# Patient Record
Sex: Male | Born: 1945 | Race: White | Hispanic: No | Marital: Married | Smoking: Former smoker
Health system: Southern US, Community
[De-identification: ages and names within clinical notes are randomized; demographics above are authoritative.]

## PROBLEM LIST (undated history)

## (undated) DIAGNOSIS — M545 Low back pain, unspecified: Secondary | ICD-10-CM

## (undated) DIAGNOSIS — M79606 Pain in leg, unspecified: Secondary | ICD-10-CM

## (undated) DIAGNOSIS — J9811 Atelectasis: Secondary | ICD-10-CM

## (undated) DIAGNOSIS — M5416 Radiculopathy, lumbar region: Secondary | ICD-10-CM

## (undated) DIAGNOSIS — M51369 Other intervertebral disc degeneration, lumbar region without mention of lumbar back pain or lower extremity pain: Secondary | ICD-10-CM

## (undated) DIAGNOSIS — I2510 Atherosclerotic heart disease of native coronary artery without angina pectoris: Secondary | ICD-10-CM

## (undated) DIAGNOSIS — B029 Zoster without complications: Secondary | ICD-10-CM

## (undated) DIAGNOSIS — G4730 Sleep apnea, unspecified: Secondary | ICD-10-CM

## (undated) DIAGNOSIS — R609 Edema, unspecified: Secondary | ICD-10-CM

## (undated) DIAGNOSIS — I5189 Other ill-defined heart diseases: Secondary | ICD-10-CM

## (undated) DIAGNOSIS — Z7901 Long term (current) use of anticoagulants: Secondary | ICD-10-CM

## (undated) DIAGNOSIS — H9190 Unspecified hearing loss, unspecified ear: Secondary | ICD-10-CM

## (undated) DIAGNOSIS — Z8619 Personal history of other infectious and parasitic diseases: Secondary | ICD-10-CM

## (undated) DIAGNOSIS — M48062 Spinal stenosis, lumbar region with neurogenic claudication: Secondary | ICD-10-CM

## (undated) DIAGNOSIS — G629 Polyneuropathy, unspecified: Secondary | ICD-10-CM

## (undated) DIAGNOSIS — M1990 Unspecified osteoarthritis, unspecified site: Secondary | ICD-10-CM

## (undated) DIAGNOSIS — I499 Cardiac arrhythmia, unspecified: Secondary | ICD-10-CM

## (undated) DIAGNOSIS — I498 Other specified cardiac arrhythmias: Secondary | ICD-10-CM

## (undated) DIAGNOSIS — I4719 Other supraventricular tachycardia: Secondary | ICD-10-CM

## (undated) DIAGNOSIS — M5136 Other intervertebral disc degeneration, lumbar region: Secondary | ICD-10-CM

## (undated) DIAGNOSIS — M5126 Other intervertebral disc displacement, lumbar region: Secondary | ICD-10-CM

## (undated) DIAGNOSIS — E119 Type 2 diabetes mellitus without complications: Secondary | ICD-10-CM

## (undated) DIAGNOSIS — E7800 Pure hypercholesterolemia, unspecified: Secondary | ICD-10-CM

## (undated) DIAGNOSIS — I5030 Unspecified diastolic (congestive) heart failure: Secondary | ICD-10-CM

## (undated) DIAGNOSIS — Z8709 Personal history of other diseases of the respiratory system: Secondary | ICD-10-CM

## (undated) DIAGNOSIS — I480 Paroxysmal atrial fibrillation: Secondary | ICD-10-CM

## (undated) DIAGNOSIS — J449 Chronic obstructive pulmonary disease, unspecified: Secondary | ICD-10-CM

## (undated) DIAGNOSIS — R0600 Dyspnea, unspecified: Secondary | ICD-10-CM

## (undated) DIAGNOSIS — I10 Essential (primary) hypertension: Secondary | ICD-10-CM

## (undated) DIAGNOSIS — J45909 Unspecified asthma, uncomplicated: Secondary | ICD-10-CM

## (undated) DIAGNOSIS — E039 Hypothyroidism, unspecified: Secondary | ICD-10-CM

## (undated) DIAGNOSIS — I471 Supraventricular tachycardia: Secondary | ICD-10-CM

## (undated) HISTORY — DX: Essential (primary) hypertension: I10

## (undated) HISTORY — PX: KNEE ARTHROSCOPY: SHX127

## (undated) HISTORY — PX: CORONARY ANGIOPLASTY: SHX604

## (undated) HISTORY — DX: Paroxysmal atrial fibrillation: I48.0

## (undated) HISTORY — DX: Zoster without complications: B02.9

## (undated) HISTORY — DX: Other specified cardiac arrhythmias: I49.8

## (undated) HISTORY — PX: ANTERIOR CERVICAL DECOMP/DISCECTOMY FUSION: SHX1161

## (undated) HISTORY — DX: Other supraventricular tachycardia: I47.19

## (undated) HISTORY — DX: Unspecified asthma, uncomplicated: J45.909

## (undated) HISTORY — PX: BACK SURGERY: SHX140

## (undated) HISTORY — DX: Other ill-defined heart diseases: I51.89

## (undated) HISTORY — DX: Supraventricular tachycardia: I47.1

---

## 2001-05-01 HISTORY — PX: KNEE SURGERY: SHX244

## 2003-12-21 DIAGNOSIS — K635 Polyp of colon: Secondary | ICD-10-CM | POA: Insufficient documentation

## 2006-05-01 HISTORY — PX: NECK SURGERY: SHX720

## 2007-09-03 ENCOUNTER — Ambulatory Visit: Payer: Self-pay | Admitting: Internal Medicine

## 2007-12-02 ENCOUNTER — Ambulatory Visit: Payer: Self-pay | Admitting: Family

## 2008-01-21 ENCOUNTER — Other Ambulatory Visit: Payer: Self-pay

## 2008-01-22 ENCOUNTER — Observation Stay: Payer: Self-pay | Admitting: Internal Medicine

## 2008-01-22 ENCOUNTER — Ambulatory Visit: Payer: Self-pay | Admitting: Cardiology

## 2009-02-01 ENCOUNTER — Ambulatory Visit (HOSPITAL_COMMUNITY): Admission: RE | Admit: 2009-02-01 | Discharge: 2009-02-02 | Payer: Self-pay | Admitting: Neurosurgery

## 2010-08-05 LAB — CBC
MCHC: 34.4 g/dL (ref 30.0–36.0)
MCV: 91.2 fL (ref 78.0–100.0)
RBC: 5.11 MIL/uL (ref 4.22–5.81)
RDW: 12.4 % (ref 11.5–15.5)

## 2010-08-05 LAB — BASIC METABOLIC PANEL
BUN: 13 mg/dL (ref 6–23)
Calcium: 9.1 mg/dL (ref 8.4–10.5)
GFR calc Af Amer: >60 mL/min (ref >60)
GFR calc non Af Amer: >60 mL/min (ref >60)
Sodium: 139 mEq/L (ref 135–145)

## 2012-03-31 DIAGNOSIS — E1169 Type 2 diabetes mellitus with other specified complication: Secondary | ICD-10-CM | POA: Insufficient documentation

## 2012-04-19 ENCOUNTER — Encounter: Payer: Self-pay | Admitting: *Deleted

## 2012-05-02 ENCOUNTER — Ambulatory Visit (INDEPENDENT_AMBULATORY_CARE_PROVIDER_SITE_OTHER): Payer: Medicare Other | Admitting: Cardiovascular Disease

## 2012-05-02 ENCOUNTER — Encounter: Payer: Self-pay | Admitting: Cardiovascular Disease

## 2012-05-02 VITALS — BP 134/80 | HR 53 | Ht 71.0 in | Wt 219.8 lb

## 2012-05-02 DIAGNOSIS — M542 Cervicalgia: Secondary | ICD-10-CM

## 2012-05-02 DIAGNOSIS — I208 Other forms of angina pectoris: Secondary | ICD-10-CM

## 2012-05-02 DIAGNOSIS — I209 Angina pectoris, unspecified: Secondary | ICD-10-CM

## 2012-05-02 DIAGNOSIS — R0602 Shortness of breath: Secondary | ICD-10-CM

## 2012-05-02 DIAGNOSIS — I2089 Other forms of angina pectoris: Secondary | ICD-10-CM | POA: Insufficient documentation

## 2012-05-02 NOTE — Patient Instructions (Addendum)

## 2012-05-02 NOTE — Patient Instructions (Addendum)
Your physician has requested that you have an exercise tolerance test. For further information please visit [email or web address]. Please also follow instruction sheet, as given.  Your physician has requested that you have an echocardiogram. Echocardiography is a painless test that uses sound waves to create images of your heart. It provides your doctor with information about the size and shape of your heart and how well your heart's chambers and valves are working. This procedure takes approximately one hour. There are no restrictions for this procedure.   

## 2012-05-02 NOTE — Assessment & Plan Note (Addendum)
The patient's symptoms of exertional neck and throat discomfort associated with significant dyspnea are worrisome for angina at low level of activities. He was recently diagnosed with type 2 diabetes and hyperlipidemia. Thus, I decided to proceed with a treadmill stress test. He was only able to exercise for 3-1/2 minutes with significant tachycardic response with frequent PACs and PVCs. He started having significant neck and throat tightness similar to his describe symptoms with 2 mm ST depression. The stress test was highly abnormal with a Duke treadmill score of -14 which is high risk. Due to that, I recommend proceeding with cardiac catheterization and possible coronary intervention. Risks, benefits and alternatives were discussed with the patient. I asked him to start taking aspirin and Lipitor which was prescribed to him. He reports being allergic to iodine. However, he mentions that it was given to him during a stress test. He had CT scans in the past without reactions. Thus, I don't think he is truly allergic to the IV contrast. I will nonetheless give him IV steroids and Benadryl on-call to the cath lab but will not treat him with prednisone. He reports family history of abdominal aortic aneurysm. I will try to do abdominal aortogram at the same time.

## 2012-05-02 NOTE — Procedures (Signed)
    Treadmill Stress test  Indication: Exertional neck discomfort and dyspnea.  Baseline Data:  Resting EKG shows NSR with rate of 62 bpm, no significant ST changes. Resting blood pressure of 134/80 mm Hg Stand bruce protocal was used.  Exercise Data:  Patient exercised for 3 min 49 sec,  Peak heart rate of 169 bpm.  This was 109 % of the maximum predicted heart rate. The patient had significant throat and neck tightness within 3 minutes of exercise with associated ECG changes. Symptoms resolved after about 5 minutes of rest.  Peak Blood pressure recorded was 168/82 Maximal work level: 4.6 METs.  Heart rate at 3 minutes in recovery was 59 bpm. BP response: Normal HR response: Accelerated.  EKG with Exercise: Sinus tachycardia with significant PACs and PVCs. 2 mm of horizontal and downsloping ST depression in V3 to V6 and 1 mm of horizontal ST depression in the inferior leads.  FINAL IMPRESSION: Abnormal exercise stress test. Significant ST depression with exercise at low level of stress.  Poor exercise tolerance with exercise induced neck and throat discomfort. Duke treadmill score of -14 which is high risk.  Recommendation: Cardiac catheterization.

## 2012-05-02 NOTE — Progress Notes (Signed)
HPI  This is a 67 year old male who was referred by Dr. Juanetta Gosling for evaluation of possible atypical angina due to exertional neck and throat tightness. The patient is not aware of any previous cardiac history. He reports having a stress test done years ago at Dr. Fredna Dow office without reported abnormalities. He was recently diagnosed with type 2 diabetes, hyperlipidemia and hypothyroidism. The patient has been having exertional neck and throat tightness associated with significant dyspnea which started a few months ago and has been getting worse. He is currently happening with minimal activities. He denies any chest discomfort. No orthopnea, PND or lower extremity edema.  Allergies  Allergen Reactions  . Iodine     Rash and itching  . Penicillins     Hives  . Sulfa Antibiotics     Difficulty Breathing     Current Outpatient Prescriptions on File Prior to Visit  Medication Sig Dispense Refill  . atorvastatin (LIPITOR) 10 MG tablet Take 10 mg by mouth daily.      [name] levothyroxine (SYNTHROID, LEVOTHROID) 25 MCG tablet Take 25 mcg by mouth daily.      . metFORMIN (GLUMETZA) 500 MG (MOD) 24 hr tablet Take 500 mg by mouth 2 (two) times daily with a meal.         Past Medical History  Diagnosis Date  . Diabetes mellitus without complication   . Thyroid disease      Past Surgical History  Procedure Date  . Knee surgery 2003    right  . Neck surgery 2008    Plate in neck     Family History  Problem Relation Age of Onset  . Heart disease Mother   . Clotting disorder Mother   . Hypertension Sister      History   Social History  . Marital Status: Married    Spouse Name: N/A    Number of Children: N/A  . Years of Education: N/A   Occupational History  . Not on file.   Social History Main Topics  . Smoking status: Former Smoker -- 8 years    Types: Cigarettes  . Smokeless tobacco: Not on file  . Alcohol Use: No  . Drug Use: No  . Sexually Active:    Other  Topics Concern  . Not on file   Social History Narrative  . No narrative on file     ROS Constitutional: Negative for fever, chills, diaphoresis, activity change, appetite change and fatigue.  HENT: Negative for hearing loss, nosebleeds, congestion, sore throat, facial swelling, drooling, trouble swallowing, neck pain, voice change, sinus pressure and tinnitus.  Eyes: Negative for photophobia, pain, discharge and visual disturbance.  Respiratory: Negative for apnea, cough, chest tightness and wheezing.  Cardiovascular: Negative for chest pain, palpitations and leg swelling.  Gastrointestinal: Negative for nausea, vomiting, abdominal pain, diarrhea, constipation, blood in stool and abdominal distention.  Genitourinary: Negative for dysuria, urgency, frequency, hematuria and decreased urine volume.  Musculoskeletal: Negative for myalgias, back pain, joint swelling, arthralgias and gait problem.  Skin: Negative for color change, pallor, rash and wound.  Neurological: Negative for dizziness, tremors, seizures, syncope, speech difficulty, weakness, light-headedness, numbness and headaches.  Psychiatric/Behavioral: Negative for suicidal ideas, hallucinations, behavioral problems and agitation. The patient is not nervous/anxious.     PHYSICAL EXAM   BP 134/80  Pulse 53  Ht 5\' 11"  (1.803 m)  Wt 219 lb 12 oz (99.678 kg)  BMI 30.65 kg/m2  Constitutional: He is oriented to person, place, and time.  He appears well-developed and well-nourished. No distress.  HENT: No nasal discharge.  Head: Normocephalic and atraumatic.  Eyes: Pupils are equal and round. Right eye exhibits no discharge. Left eye exhibits no discharge.  Neck: Normal range of motion. Neck supple. No JVD present. No thyromegaly present.  Cardiovascular: Normal rate, regular rhythm, normal heart sounds and. Exam reveals no gallop and no friction rub. No murmur heard.  Pulmonary/Chest: Effort normal and breath sounds normal. No  stridor. No respiratory distress. He has no wheezes. He has no rales. He exhibits no tenderness.  Abdominal: Soft. Bowel sounds are normal. He exhibits no distension. There is no tenderness. There is no rebound and no guarding.  Musculoskeletal: Normal range of motion. He exhibits no edema and no tenderness.  Neurological: He is alert and oriented to person, place, and time. Coordination normal.  Skin: Skin is warm and dry. No rash noted. He is not diaphoretic. No erythema. No pallor.  Psychiatric: He has a normal mood and affect. His behavior is normal. Judgment and thought content normal.      EKG: Sinus  Bradycardia  - occasional PAC    # PACs = 1. -Poor R-wave progression -nonspecific -consider old anterior infarct.   BORDERLINE   ASSESSMENT AND PLAN

## 2012-05-06 ENCOUNTER — Ambulatory Visit: Payer: Self-pay | Admitting: Cardiovascular Disease

## 2012-05-06 ENCOUNTER — Other Ambulatory Visit: Payer: Self-pay | Admitting: *Deleted

## 2012-05-06 ENCOUNTER — Other Ambulatory Visit: Payer: Self-pay | Admitting: Cardiovascular Disease

## 2012-05-06 ENCOUNTER — Encounter: Payer: Self-pay | Admitting: Cardiovascular Disease

## 2012-05-06 ENCOUNTER — Inpatient Hospital Stay (HOSPITAL_COMMUNITY): Payer: Medicare Other

## 2012-05-06 ENCOUNTER — Telehealth: Payer: Self-pay

## 2012-05-06 ENCOUNTER — Encounter (HOSPITAL_COMMUNITY): Payer: Self-pay | Admitting: General Practice

## 2012-05-06 ENCOUNTER — Inpatient Hospital Stay (HOSPITAL_COMMUNITY)
Admission: AD | Admit: 2012-05-06 | Discharge: 2012-05-13 | DRG: 236 | Disposition: A | Discharge status: Home / Self Care | Payer: Medicare Other | Source: Other Acute Inpatient Hospital | Attending: Cardiothoracic Surgery | Admitting: Cardiothoracic Surgery

## 2012-05-06 DIAGNOSIS — I2510 Atherosclerotic heart disease of native coronary artery without angina pectoris: Secondary | ICD-10-CM

## 2012-05-06 DIAGNOSIS — Z951 Presence of aortocoronary bypass graft: Secondary | ICD-10-CM

## 2012-05-06 DIAGNOSIS — I200 Unstable angina: Secondary | ICD-10-CM | POA: Diagnosis present

## 2012-05-06 DIAGNOSIS — Z87891 Personal history of nicotine dependence: Secondary | ICD-10-CM

## 2012-05-06 DIAGNOSIS — E039 Hypothyroidism, unspecified: Secondary | ICD-10-CM | POA: Diagnosis present

## 2012-05-06 DIAGNOSIS — E119 Type 2 diabetes mellitus without complications: Secondary | ICD-10-CM

## 2012-05-06 DIAGNOSIS — E785 Hyperlipidemia, unspecified: Secondary | ICD-10-CM | POA: Diagnosis present

## 2012-05-06 DIAGNOSIS — IMO0001 Reserved for inherently not codable concepts without codable children: Secondary | ICD-10-CM | POA: Diagnosis present

## 2012-05-06 DIAGNOSIS — I4891 Unspecified atrial fibrillation: Secondary | ICD-10-CM | POA: Diagnosis present

## 2012-05-06 DIAGNOSIS — Z01810 Encounter for preprocedural cardiovascular examination: Secondary | ICD-10-CM

## 2012-05-06 DIAGNOSIS — I739 Peripheral vascular disease, unspecified: Secondary | ICD-10-CM

## 2012-05-06 DIAGNOSIS — D62 Acute posthemorrhagic anemia: Secondary | ICD-10-CM | POA: Diagnosis not present

## 2012-05-06 HISTORY — DX: Atherosclerotic heart disease of native coronary artery without angina pectoris: I25.10

## 2012-05-06 HISTORY — DX: Personal history of other diseases of the respiratory system: Z87.09

## 2012-05-06 HISTORY — DX: Atelectasis: J98.11

## 2012-05-06 HISTORY — DX: Type 2 diabetes mellitus without complications: E11.9

## 2012-05-06 HISTORY — DX: Hypothyroidism, unspecified: E03.9

## 2012-05-06 HISTORY — PX: CARDIAC CATHETERIZATION: SHX172

## 2012-05-06 HISTORY — DX: Unspecified osteoarthritis, unspecified site: M19.90

## 2012-05-06 HISTORY — DX: Pure hypercholesterolemia, unspecified: E78.00

## 2012-05-06 LAB — TYPE AND SCREEN
ABO/RH(D): A POS
Antibody Screen: NEGATIVE

## 2012-05-06 LAB — URINALYSIS, ROUTINE W REFLEX MICROSCOPIC
Bilirubin Urine: NEGATIVE
Hgb urine dipstick: NEGATIVE
Nitrite: NEGATIVE
Specific Gravity, Urine: 1.015 (ref 1.005–1.030)
pH: 5 (ref 5.0–8.0)

## 2012-05-06 LAB — URINE MICROSCOPIC-ADD ON

## 2012-05-06 LAB — PROTIME-INR
INR: 0.9
Prothrombin Time: 12.3 secs (ref 11.5–14.7)

## 2012-05-06 LAB — CREATININE, SERUM
Creatinine, Ser: 0.81 mg/dL (ref 0.50–1.35)
GFR calc Af Amer: >90 mL/min (ref >90)
GFR calc non Af Amer: >90 mL/min (ref >90)

## 2012-05-06 LAB — CBC
HCT: 44.4 % (ref 39.0–52.0)
Hemoglobin: 15.9 g/dL (ref 13.0–17.0)
MCHC: 35.8 g/dL (ref 30.0–36.0)
RDW: 12.2 % (ref 11.5–15.5)
WBC: 6.4 10*3/uL (ref 4.0–10.5)

## 2012-05-06 LAB — GLUCOSE, CAPILLARY
Glucose-Capillary: 397 mg/dL — ABNORMAL HIGH (ref 70–99)
Glucose-Capillary: 309 mg/dL — ABNORMAL HIGH (ref 70–99)

## 2012-05-06 MED ORDER — INSULIN ASPART 100 UNIT/ML ~~LOC~~ SOLN
15.0000 [IU] | Freq: Once | SUBCUTANEOUS | Status: AC
  Administered 2012-05-06: 15 [IU] via SUBCUTANEOUS

## 2012-05-06 MED ORDER — BISACODYL 5 MG PO TBEC
5.0000 mg | DELAYED_RELEASE_TABLET | Freq: Once | ORAL | Status: AC
  Administered 2012-05-06: 5 mg via ORAL
  Filled 2012-05-06: qty 1

## 2012-05-06 MED ORDER — PHENYLEPHRINE HCL 10 MG/ML IJ SOLN
30.0000 ug/min | INTRAMUSCULAR | Status: DC
  Filled 2012-05-06: qty 2

## 2012-05-06 MED ORDER — ALBUTEROL SULFATE (5 MG/ML) 0.5% IN NEBU
2.5000 mg | INHALATION_SOLUTION | Freq: Once | RESPIRATORY_TRACT | Status: AC
  Administered 2012-05-06: 2.5 mg via RESPIRATORY_TRACT

## 2012-05-06 MED ORDER — MAGNESIUM SULFATE 50 % IJ SOLN
40.0000 meq | INTRAMUSCULAR | Status: DC
  Filled 2012-05-06: qty 10

## 2012-05-06 MED ORDER — ASPIRIN EC 81 MG PO TBEC
81.0000 mg | DELAYED_RELEASE_TABLET | Freq: Every morning | ORAL | Status: DC
  Filled 2012-05-06: qty 1

## 2012-05-06 MED ORDER — TEMAZEPAM 15 MG PO CAPS: 15.0000 mg | ORAL_CAPSULE | Freq: Once | ORAL | Status: AC | PRN

## 2012-05-06 MED ORDER — INSULIN ASPART 100 UNIT/ML ~~LOC~~ SOLN: 0.0000 [IU] | Freq: Every day | SUBCUTANEOUS | Status: DC

## 2012-05-06 MED ORDER — DEXMEDETOMIDINE HCL IN NACL 400 MCG/100ML IV SOLN
0.1000 ug/kg/h | INTRAVENOUS | Status: DC
  Filled 2012-05-06: qty 100

## 2012-05-06 MED ORDER — SODIUM CHLORIDE 0.9 % IJ SOLN: 3.0000 mL | INTRAMUSCULAR | Status: DC | PRN

## 2012-05-06 MED ORDER — ZOLPIDEM TARTRATE 5 MG PO TABS: 5.0000 mg | ORAL_TABLET | Freq: Every evening | ORAL | Status: DC | PRN

## 2012-05-06 MED ORDER — PLASMA-LYTE 148 IV SOLN
INTRAVENOUS | Status: AC
  Administered 2012-05-07: 10:00:00
  Filled 2012-05-06: qty 2.5

## 2012-05-06 MED ORDER — INSULIN ASPART 100 UNIT/ML ~~LOC~~ SOLN
0.0000 [IU] | Freq: Three times a day (TID) | SUBCUTANEOUS | Status: DC
  Administered 2012-05-06: 11 [IU] via SUBCUTANEOUS
  Administered 2012-05-07: 8 [IU] via SUBCUTANEOUS

## 2012-05-06 MED ORDER — ENOXAPARIN SODIUM 40 MG/0.4ML ~~LOC~~ SOLN
40.0000 mg | SUBCUTANEOUS | Status: DC
  Filled 2012-05-06: qty 0.4

## 2012-05-06 MED ORDER — AMINOCAPROIC ACID 250 MG/ML IV SOLN
INTRAVENOUS | Status: DC
  Filled 2012-05-06: qty 40

## 2012-05-06 MED ORDER — LEVOFLOXACIN IN D5W 500 MG/100ML IV SOLN
500.0000 mg | INTRAVENOUS | Status: DC
  Filled 2012-05-06 (×2): qty 100

## 2012-05-06 MED ORDER — ACETAMINOPHEN 325 MG PO TABS: 650.0000 mg | ORAL_TABLET | ORAL | Status: DC | PRN

## 2012-05-06 MED ORDER — LORATADINE 10 MG PO TABS
10.0000 mg | ORAL_TABLET | Freq: Every day | ORAL | Status: DC | PRN
  Filled 2012-05-06: qty 1

## 2012-05-06 MED ORDER — ALPRAZOLAM 0.25 MG PO TABS: 0.2500 mg | ORAL_TABLET | Freq: Two times a day (BID) | ORAL | Status: DC | PRN

## 2012-05-06 MED ORDER — EPINEPHRINE HCL 1 MG/ML IJ SOLN
0.5000 ug/min | INTRAVENOUS | Status: DC
  Filled 2012-05-06: qty 4

## 2012-05-06 MED ORDER — VANCOMYCIN HCL 10 G IV SOLR
1500.0000 mg | INTRAVENOUS | Status: AC
  Administered 2012-05-07: 1500 mg via INTRAVENOUS
  Filled 2012-05-06: qty 1500

## 2012-05-06 MED ORDER — ATORVASTATIN CALCIUM 20 MG PO TABS
20.0000 mg | ORAL_TABLET | Freq: Every evening | ORAL | Status: DC
  Administered 2012-05-06 – 2012-05-12 (×6): 20 mg via ORAL
  Filled 2012-05-06 (×9): qty 1

## 2012-05-06 MED ORDER — SODIUM CHLORIDE 0.9 % IV SOLN
INTRAVENOUS | Status: DC
  Filled 2012-05-06: qty 1

## 2012-05-06 MED ORDER — NITROGLYCERIN IN D5W 200-5 MCG/ML-% IV SOLN
2.0000 ug/min | INTRAVENOUS | Status: DC
  Filled 2012-05-06: qty 250

## 2012-05-06 MED ORDER — METOPROLOL TARTRATE 12.5 MG HALF TABLET
12.5000 mg | ORAL_TABLET | Freq: Once | ORAL | Status: AC
  Administered 2012-05-07: 12.5 mg via ORAL
  Filled 2012-05-06: qty 1

## 2012-05-06 MED ORDER — SODIUM CHLORIDE 0.9 % IJ SOLN
3.0000 mL | Freq: Two times a day (BID) | INTRAMUSCULAR | Status: DC
  Administered 2012-05-06: 3 mL via INTRAVENOUS

## 2012-05-06 MED ORDER — DOPAMINE-DEXTROSE 3.2-5 MG/ML-% IV SOLN
2.0000 ug/kg/min | INTRAVENOUS | Status: DC
  Filled 2012-05-06: qty 250

## 2012-05-06 MED ORDER — NITROGLYCERIN 0.4 MG SL SUBL: 0.4000 mg | SUBLINGUAL_TABLET | SUBLINGUAL | Status: DC | PRN

## 2012-05-06 MED ORDER — POTASSIUM CHLORIDE 2 MEQ/ML IV SOLN
80.0000 meq | INTRAVENOUS | Status: DC
  Filled 2012-05-06: qty 40

## 2012-05-06 MED ORDER — SODIUM CHLORIDE 0.9 % IV SOLN
250.0000 mL | INTRAVENOUS | Status: DC | PRN
  Administered 2012-05-06: 250 mL via INTRAVENOUS

## 2012-05-06 MED ORDER — INSULIN ASPART 100 UNIT/ML ~~LOC~~ SOLN
8.0000 [IU] | Freq: Once | SUBCUTANEOUS | Status: AC
  Administered 2012-05-07: 8 [IU] via SUBCUTANEOUS

## 2012-05-06 MED ORDER — ONDANSETRON HCL 4 MG/2ML IJ SOLN: 4.0000 mg | Freq: Four times a day (QID) | INTRAMUSCULAR | Status: DC | PRN

## 2012-05-06 MED ORDER — HEPARIN (PORCINE) IN NACL 100-0.45 UNIT/ML-% IJ SOLN
1350.0000 [IU]/h | INTRAMUSCULAR | Status: DC
  Administered 2012-05-06: 1350 [IU]/h via INTRAVENOUS
  Filled 2012-05-06 (×2): qty 250

## 2012-05-06 MED ORDER — CHLORHEXIDINE GLUCONATE 4 % EX LIQD
60.0000 mL | Freq: Once | CUTANEOUS | Status: AC
  Administered 2012-05-07: 4 via TOPICAL
  Filled 2012-05-06 (×2): qty 60

## 2012-05-06 MED ORDER — LEVOTHYROXINE SODIUM 25 MCG PO TABS
25.0000 ug | ORAL_TABLET | Freq: Every morning | ORAL | Status: DC
  Administered 2012-05-08 – 2012-05-13 (×6): 25 ug via ORAL
  Filled 2012-05-06 (×7): qty 1

## 2012-05-06 NOTE — Telephone Encounter (Signed)
Dr. Juanetta Gosling' office made aware of transfer to [facility] for CABG

## 2012-05-06 NOTE — Progress Notes (Signed)
Patient's CBG=397.  I notified Theodore Demark, PA.  She stated to give 15 units of novolog X 1.

## 2012-05-06 NOTE — Progress Notes (Addendum)
MRN: [id number] - Has H&P and cath report as scanned document.  Philip [name] is a 67 year old male with no previous history of CAD. He was recently diagnosed with diabetes and hyperlipidemia but has taken meds for less than a week. He was seen by Dr Kirke Corin for chest pain, symptoms concerning for angina and cathed at [facility] today. Details are available under MRN: [id number], he has significant CAD and was transferred to [facility] for TCTS evaluation.   Currently, Philip [name] is without chest pain or SOB. He is having no problems with his cath site. He has not taken his metformin yesterday or today. His blood sugar was > 300 by [facility]. On a recent lipid profile, his total cholesterol was 228 with an LDL of 147. He does not know his A1C.   Have contacted [facility] to obtain a CD of the catheterization. Will order home meds, ADA diet and SSI. No beta blocker with HR 50s. With new diagnosis of CAD and significant LDL elevation, will change Lipitor from 10 mg daily to 20 mg daily. Dietary compliance encouraged with low cholesterol diabetic diet. Can consider ACE in am (EF 42% at cath) if BUN/Cr stable after cath. Use heparin per Dr Jari Sportsman note. Further plans per TCTS.  Plans discussed with patient and family in room.

## 2012-05-06 NOTE — Consult Note (Signed)
[address]            [place] [address]          [phone]    PCP is MASOUD,JAVED, MD Referring Provider is [facility] ARIDA   History of Presenting Illness: This is a 67 year old Caucasian male who was referred by Dr. Juanetta Gosling last week for evaluation of possible atypical angina due to exertional neck and throat tightness. The patient was not aware of any previous cardiac history. He reports having a stress test done years ago at Dr. Fredna Dow office without any reported abnormalities. He was recently diagnosed with type 2 diabetes, hyperlipidemia and hypothyroidism. The patient has been having exertional neck and throat tightness, associated with significant dyspnea.These symptoms started a few months ago and have been getting worse. They are currently happening with minimal activities. He denies any chest discomfort, orthopnea, PND or lower extremity edema.  He underwent a treadmill stress test which was highly abnormal with significant ST depression and reproducible neck and throat tightness. Duke treadmill score was -14 indicating a high risk stress test.  He underwent cardiac catheterization today at [facility], which showed significant three-vessel coronary artery disease, with 99% ostial LAD stenosis which has been favorable angulation for PCI. His ejection fraction was mildly reduced at 42%. Due to these findings, he was transferred by Dr. Kirke Corin for evaluation for a CABG. Given the severity of stenosis in the ostial LAD and high risk stress test, Dr. Kirke Corin elected to transfer him instead of continue with an outpatient evaluation. Currently, he is chest pain free.   Past Medical History: 1.Diabetes mellitus 2.Hyperlipidemia 3.Hyopthyroidsm  Past Surgical History: .  Knee surgery  2003     right   .  Neck surgery anterior approach denies limitation of motion 2008     Plate in neck     Family History:  Heart disease and  clotting disorder Mother   Hypertension  Sister    Social History  Smoking status:  Former Smoker -- 8 years  Types: Cigarettes   Alcohol Use:  No   Drug Use:  No    Current Facility-Administered Medications  Medication Dose Route Frequency Provider Last Rate Last Dose  . 0.9 %  sodium chloride infusion  250 mL Intravenous PRN Joline Salt Barrett, PA      . acetaminophen (TYLENOL) tablet 650 mg  650 mg Oral Q4H PRN Rhonda G Barrett, PA      . albuterol (PROVENTIL) (5 MG/ML) 0.5% nebulizer solution 2.5 mg  2.5 mg Nebulization Once Delight Ovens, MD      . ALPRAZolam Prudy Feeler) tablet 0.25 mg  0.25 mg Oral BID PRN Darrol Jump, PA      . aspirin EC tablet 81 mg  81 mg Oral q morning - 10a Rhonda G Barrett, PA      . atorvastatin (LIPITOR) tablet 20 mg  20 mg Oral QPM Rhonda G Barrett, PA      . insulin aspart (novoLOG) injection 0-15 Units  0-15 Units Subcutaneous TID WC Rhonda G Barrett, PA      . insulin aspart (novoLOG) injection 0-5 Units  0-5 Units Subcutaneous QHS Rhonda G Barrett, PA      . levothyroxine (SYNTHROID, LEVOTHROID) tablet 25 mcg  25 mcg Oral q morning - 10a Rhonda G Barrett, PA      . loratadine (CLARITIN) tablet 10  mg  10 mg Oral Daily PRN Joline Salt Barrett, PA      . nitroGLYCERIN (NITROSTAT) SL tablet 0.4 mg  0.4 mg Sublingual Q5 Min x 3 PRN Joline Salt Barrett, PA      . ondansetron (ZOFRAN) injection 4 mg  4 mg Intravenous Q6H PRN Rhonda G Barrett, PA      . sodium chloride 0.9 % injection 3 mL  3 mL Intravenous Q12H Rhonda G Barrett, PA   3 mL at 05/06/12 1503  . sodium chloride 0.9 % injection 3 mL  3 mL Intravenous PRN Joline Salt Barrett, PA      . zolpidem (AMBIEN) tablet 5 mg  5 mg Oral QHS PRN Darrol Jump, PA        Allergies: Allergies  Allergen Reactions  . Sulfa Antibiotics Shortness Of Breath  . Iodine Rash  . Penicillins Hives, Itching and Rash    Review of Systems: Constitutional: Negative for fever, chills, diaphoresis, activity  change, appetite change and fatigue.  HENT: Negative for hearing loss, nosebleeds, congestion, sore throat, facial swelling, drooling, trouble swallowing, neck pain, voice change, sinus pressure and tinnitus.  Eyes: Negative for photophobia, pain, discharge and visual disturbance.  Respiratory: Negative for apnea, cough, chest tightness and wheezing.  Cardiovascular: Negative for chest pain, palpitations and leg swelling.  Gastrointestinal: Negative for nausea, vomiting, abdominal pain, diarrhea, constipation, blood in stool and abdominal distention.  Genitourinary: Negative for dysuria, urgency, frequency, hematuria and decreased urine volume.  Musculoskeletal: Negative for myalgias, back pain, joint swelling, arthralgias and gait problem.  Skin: Negative for color change, pallor, rash and wound.  Neurological: Negative for dizziness, tremors, seizures, syncope, speech difficulty, weakness, light-headedness, numbness and headaches.  Psychiatric/Behavioral: Negative for suicidal ideas, hallucinations, behavioral problems and agitation. The patient is not nervous/anxious.     BP 152/67  Pulse 63  Temp 97.6 F (36.4 C) (Oral)  Resp 20  SpO2 94%  Physical Exam: General appearance: alert, cooperative and no distress Neurologic: intact without focal deficits; he is alert and oriented to person, place, and time Heart: regular rate and rhythm, S1, S2 normal, no murmur, click, rub or gallop Lungs: clear to auscultation bilaterally Abdomen: soft, non-tender; bowel sounds normal; no masses,  no organomegaly, no dilation of abdominal aortaExtremities: extremities normal, atraumatic, no cyanosis or edema Musculoskeletal: Normal range of motion. He exhibits no edema and no tenderness.  Skin: Skin is warm and dry. No rash noted. He is not diaphoretic. No erythema. No pallor.    Diagnostic Studies and Lab Results: Results for orders placed during the hospital encounter of 05/06/12 (from the past 48  hour(s))  CBC     Status: Normal   Collection Time   05/06/12  2:26 PM      Component Value Range Comment   WBC 6.4  4.0 - 10.5 K/uL    RBC 5.29  4.22 - 5.81 MIL/uL    Hemoglobin 15.9  13.0 - 17.0 g/dL    HCT 09.8  11.9 - 14.7 %    MCV 83.9  78.0 - 100.0 fL    MCH 30.1  26.0 - 34.0 pg    MCHC 35.8  30.0 - 36.0 g/dL    RDW 82.9  56.2 - 13.0 %    Platelets 164  150 - 400 K/uL   CREATININE, SERUM     Status: Normal   Collection Time   05/06/12  2:26 PM      Component Value Range Comment   Creatinine,  Ser 0.81  0.50 - 1.35 mg/dL    GFR calc non Af Amer >90  >90 mL/min    GFR calc Af Amer >90  >90 mL/min   GLUCOSE, CAPILLARY     Status: Abnormal   Collection Time   05/06/12  2:30 PM      Component Value Range Comment   Glucose-Capillary 397 (*) 70 - 99 mg/dL    Comment 1 Notify RN      No cath report done, films reviewed. As above 95% prox lad 70 cix 50- % rca  Impression and Plan:Will be placed on a heparin drip, per cardiology. With DM and 3 vessel disease with high grade osteal LAD ,  CABG offers best relief of symptoms and preservation of life. I have discussed this with patient and he is will to proceed.  The goals risks and alternatives of the planned surgical procedure CABG  have been discussed with the patient in detail. The risks of the procedure including death, infection, stroke, myocardial infarction, bleeding, blood transfusion have all been discussed specifically.  I have quoted Juanetta Snow a 4 % of perioperative mortality and a complication rate as high as 20%. The patient's questions have been answered.Philip [name] is willing  to proceed with the planned procedure.    Delight Ovens MD  Beeper [phone] Office [phone] 05/06/2012 7:00 PM

## 2012-05-06 NOTE — Progress Notes (Signed)
ANTICOAGULATION CONSULT NOTE - Initial Consult  Pharmacy Consult for heparin Indication: s/p cath for CABG  Allergies  Allergen Reactions  . Sulfa Antibiotics Shortness Of Breath  . Iodine Rash  . Penicillins Hives, Itching and Rash    Patient Measurements: Wt=99.2kg Ht= 71 inches IBW= 75.3kg Heparin dosing weight= 95kg   Vital Signs: Temp: 97.6 F (36.4 C) (01/06 1210) Temp src: Oral (01/06 1210) BP: 152/67 mmHg (01/06 1210) Pulse Rate: 63  (01/06 1210)  Labs:  Basename 05/06/12 1426  HGB 15.9  HCT 44.4  PLT 164  APTT --  LABPROT --  INR --  HEPARINUNFRC --  CREATININE 0.81  CKTOTAL --  CKMB --  TROPONINI --    CrCl is unknown because there is no height on file for the current visit.   Medical History: No past medical history on file.  Medications:  Prescriptions prior to admission  Medication Sig Dispense Refill  . aspirin EC 81 MG tablet Take 81 mg by mouth every morning.      [name] atorvastatin (LIPITOR) 10 MG tablet Take 10 mg by mouth every evening.      [name] levothyroxine (SYNTHROID, LEVOTHROID) 25 MCG tablet Take 25 mcg by mouth every morning.      . loratadine (CLARITIN) 10 MG tablet Take 10 mg by mouth daily as needed. For allergies      . metFORMIN (GLUCOPHAGE) 500 MG tablet Take 500 mg by mouth 2 (two) times daily with a meal.        Assessment: 67 yo male s/p cath [facility] at [facility] showing 3V CAD for CABG to start heparin 6 hours after sheath pull (cath completed this am)  Goal of Therapy:  Heparin level 0.3-0.7 units/ml Monitor platelets by anticoagulation protocol: Yes   Plan:  -Will start heparin at 1350 units/hr (~ 14 units/kg/hr) -Heparin level in 6 hours and daily wth CBC daily  Harland German, Pharm D 05/06/2012 4:11 PM

## 2012-05-06 NOTE — H&P (Signed)
Primary care physician: Dr. Juanetta Gosling.  HPI  This is a 67 year old male who was referred by Dr. Juanetta Gosling last week for evaluation of possible atypical angina due to exertional neck and throat tightness. The patient was not aware of any previous cardiac history. He reports having a stress test done years ago at Dr. Fredna Dow office without reported abnormalities. He was recently diagnosed with type 2 diabetes, hyperlipidemia and hypothyroidism. The patient has been having exertional neck and throat tightness associated with significant dyspnea which started a few months ago and has been getting worse. He is currently happening with minimal activities. He denies any chest discomfort. No orthopnea, PND or lower extremity edema.  He underwent a treadmill stress test which was highly abnormal with significant ST depression and reproducible neck and throat tightness. Duke treadmill score was -14 indicating a high risk stress test. He underwent cardiac catheterization today at [facility] which showed significant three-vessel coronary artery disease with 99% ostial LAD stenosis which has been favorable angulation for PCI. His ejection fraction was mildly reduced at 42%. Due to these findings, I transferred him for evaluation of CABG. Given the severity of stenosis in the ostial LAD and high risk stress test, I elected to transfer him instead of outpatient evaluation.  Allergies   Allergen  Reactions   .  Iodine      Rash and itching   .  Penicillins      Hives   .  Sulfa Antibiotics      Difficulty Breathing    Current Outpatient Prescriptions on File Prior to Visit   Medication  Sig  Dispense  Refill   .  atorvastatin (LIPITOR) 10 MG tablet  Take 10 mg by mouth daily.     [name]  levothyroxine (SYNTHROID, LEVOTHROID) 25 MCG tablet  Take 25 mcg by mouth daily.     .  metFORMIN (GLUMETZA) 500 MG (MOD) 24 hr tablet  Take 500 mg by mouth 2 (two) times daily with a meal.      Past Medical History   Diagnosis  Date   .   Diabetes mellitus without complication    .  Thyroid disease     Past Surgical History   Procedure  Date   .  Knee surgery  2003     right   .  Neck surgery  2008     Plate in neck    Family History   Problem  Relation  Age of Onset   .  Heart disease  Mother    .  Clotting disorder  Mother    .  Hypertension  Sister     History    Social History   .  Marital Status:  Married     Spouse Name:  N/A     Number of Children:  N/A   .  Years of Education:  N/A    Occupational History   .  Not on file.    Social History Main Topics   .  Smoking status:  Former Smoker -- 8 years     Types:  Cigarettes   .  Smokeless tobacco:  Not on file   .  Alcohol Use:  No   .  Drug Use:  No   .  Sexually Active:     Other Topics  Concern   .  Not on file    Social History Narrative   .  No narrative on file    ROS  Constitutional: Negative for  fever, chills, diaphoresis, activity change, appetite change and fatigue.  HENT: Negative for hearing loss, nosebleeds, congestion, sore throat, facial swelling, drooling, trouble swallowing, neck pain, voice change, sinus pressure and tinnitus.  Eyes: Negative for photophobia, pain, discharge and visual disturbance.  Respiratory: Negative for apnea, cough, chest tightness and wheezing.  Cardiovascular: Negative for chest pain, palpitations and leg swelling.  Gastrointestinal: Negative for nausea, vomiting, abdominal pain, diarrhea, constipation, blood in stool and abdominal distention.  Genitourinary: Negative for dysuria, urgency, frequency, hematuria and decreased urine volume.  Musculoskeletal: Negative for myalgias, back pain, joint swelling, arthralgias and gait problem.  Skin: Negative for color change, pallor, rash and wound.  Neurological: Negative for dizziness, tremors, seizures, syncope, speech difficulty, weakness, light-headedness, numbness and headaches.  Psychiatric/Behavioral: Negative for suicidal ideas, hallucinations,  behavioral problems and agitation. The patient is not nervous/anxious.   PHYSICAL EXAM  BP 134/80  Pulse 53  Ht 5\' 11"  (1.803 m)  Wt 219 lb 12 oz (99.678 kg)  BMI 30.65 kg/m2  Constitutional: He is oriented to person, place, and time. He appears well-developed and well-nourished. No distress.  HENT: No nasal discharge.  Head: Normocephalic and atraumatic.  Eyes: Pupils are equal and round. Right eye exhibits no discharge. Left eye exhibits no discharge.  Neck: Normal range of motion. Neck supple. No JVD present. No thyromegaly present.  Cardiovascular: Normal rate, regular rhythm, normal heart sounds and. Exam reveals no gallop and no friction rub. No murmur heard.  Pulmonary/Chest: Effort normal and breath sounds normal. No stridor. No respiratory distress. He has no wheezes. He has no rales. He exhibits no tenderness.  Abdominal: Soft. Bowel sounds are normal. He exhibits no distension. There is no tenderness. There is no rebound and no guarding.  Musculoskeletal: Normal range of motion. He exhibits no edema and no tenderness.  Neurological: He is alert and oriented to person, place, and time. Coordination normal.  Skin: Skin is warm and dry. No rash noted. He is not diaphoretic. No erythema. No pallor.  Psychiatric: He has a normal mood and affect. His behavior is normal. Judgment and thought content normal.      1. crescendo angina - with high risk abnormal stress test and three-vessel coronary artery disease on cardiac catheterization. Continue aspirin, atorvastatin and a beta blocker. Recommend CABG. Will start unfractionated heparin 6 hours after sheath pull. I ordered an echocardiogram.  2. type 2 diabetes: continue to hold metformin. Sliding scale insulin. 3. Family history of abdominal aortic aneurysm: abdominal aortogram showed no significant aneurysm.  [name], MD, [facility] 3:37 PM 05/06/12

## 2012-05-06 NOTE — Progress Notes (Signed)
Pre-op Cardiac Surgery  Carotid Findings:  Bilateral:  No evidence of hemodynamically significant internal carotid artery stenosis.   Vertebral artery flow is antegrade.    Bilateral palpable pedal pulses.  Upper Extremity Right Left  Brachial Pressures 136 135  Radial Waveforms Tri Tri  Ulnar Waveforms Tri Tri  Palmar Arch (Allen's Test) Normal Normal   Farrel Demark, RDMS, RVT 05/06/2012

## 2012-05-06 NOTE — Progress Notes (Signed)
  Echocardiogram 2D Echocardiogram has been performed.  Philip [name] FRANCES 05/06/2012, 5:51 PM

## 2012-05-07 ENCOUNTER — Inpatient Hospital Stay (HOSPITAL_COMMUNITY): Payer: Medicare Other

## 2012-05-07 ENCOUNTER — Encounter (HOSPITAL_COMMUNITY): Payer: Self-pay | Admitting: Anesthesiology

## 2012-05-07 ENCOUNTER — Inpatient Hospital Stay (HOSPITAL_COMMUNITY): Payer: Medicare Other | Admitting: Anesthesiology

## 2012-05-07 ENCOUNTER — Encounter (HOSPITAL_COMMUNITY)
Admission: AD | Disposition: A | Discharge status: Home / Self Care | Payer: Self-pay | Source: Other Acute Inpatient Hospital | Attending: Cardiothoracic Surgery

## 2012-05-07 DIAGNOSIS — I2510 Atherosclerotic heart disease of native coronary artery without angina pectoris: Principal | ICD-10-CM

## 2012-05-07 HISTORY — PX: CORONARY ARTERY BYPASS GRAFT: SHX141

## 2012-05-07 HISTORY — PX: INTRAOPERATIVE TRANSESOPHAGEAL ECHOCARDIOGRAM: SHX5062

## 2012-05-07 LAB — POCT I-STAT 4, (NA,K, GLUC, HGB,HCT)
Glucose, Bld: 272 mg/dL — ABNORMAL HIGH (ref 70–99)
Glucose, Bld: 220 mg/dL — ABNORMAL HIGH (ref 70–99)
Glucose, Bld: 190 mg/dL — ABNORMAL HIGH (ref 70–99)
HCT: 46 % (ref 39.0–52.0)
HCT: 30 % — ABNORMAL LOW (ref 39.0–52.0)
HCT: 32 % — ABNORMAL LOW (ref 39.0–52.0)
HCT: 39 % (ref 39.0–52.0)
Hemoglobin: 15.6 g/dL (ref 13.0–17.0)
Hemoglobin: 9.5 g/dL — ABNORMAL LOW (ref 13.0–17.0)
Hemoglobin: 10.2 g/dL — ABNORMAL LOW (ref 13.0–17.0)
Hemoglobin: 10.9 g/dL — ABNORMAL LOW (ref 13.0–17.0)
Potassium: 4.1 mEq/L (ref 3.5–5.1)
Potassium: 4.9 mEq/L (ref 3.5–5.1)
Potassium: 3.6 mEq/L (ref 3.5–5.1)
Sodium: 138 mEq/L (ref 135–145)
Sodium: 136 mEq/L (ref 135–145)
Sodium: 132 mEq/L — ABNORMAL LOW (ref 135–145)

## 2012-05-07 LAB — POCT I-STAT 3, ART BLOOD GAS (G3+)
Acid-base deficit: 2 mmol/L (ref 0.0–2.0)
Acid-base deficit: 2 mmol/L (ref 0.0–2.0)
Acid-base deficit: 1 mmol/L (ref 0.0–2.0)
Bicarbonate: 23.3 mEq/L (ref 20.0–24.0)
Bicarbonate: 23.9 mEq/L (ref 20.0–24.0)
Bicarbonate: 24.4 mEq/L — ABNORMAL HIGH (ref 20.0–24.0)
Bicarbonate: 25.4 mEq/L — ABNORMAL HIGH (ref 20.0–24.0)
O2 Saturation: 100 %
O2 Saturation: 97 %
O2 Saturation: 97 %
Patient temperature: 36.87
TCO2: 25 mmol/L (ref 0–100)
TCO2: 26 mmol/L (ref 0–100)
pCO2 arterial: 41.6 mmHg (ref 35.0–45.0)
pCO2 arterial: 42.3 mmHg (ref 35.0–45.0)
pO2, Arterial: 363 mmHg — ABNORMAL HIGH (ref 80.0–100.0)
pO2, Arterial: 93 mmHg (ref 80.0–100.0)
pO2, Arterial: 99 mmHg (ref 80.0–100.0)
pO2, Arterial: 95 mmHg (ref 80.0–100.0)

## 2012-05-07 LAB — CBC
HCT: 39 % (ref 39.0–52.0)
Hemoglobin: 13.7 g/dL (ref 13.0–17.0)
Hemoglobin: 14.1 g/dL (ref 13.0–17.0)
MCH: 30.4 pg (ref 26.0–34.0)
MCH: 30.2 pg (ref 26.0–34.0)
MCHC: 36.2 g/dL — ABNORMAL HIGH (ref 30.0–36.0)
MCV: 83.5 fL (ref 78.0–100.0)
Platelets: 172 10*3/uL (ref 150–400)
Platelets: 213 10*3/uL (ref 150–400)
RBC: 4.51 MIL/uL (ref 4.22–5.81)
RBC: 4.67 MIL/uL (ref 4.22–5.81)
RDW: 12.4 % (ref 11.5–15.5)
WBC: 17.8 10*3/uL — ABNORMAL HIGH (ref 4.0–10.5)
WBC: 16.1 10*3/uL — ABNORMAL HIGH (ref 4.0–10.5)

## 2012-05-07 LAB — PROTIME-INR
INR: 1.35 (ref 0.00–1.49)
Prothrombin Time: 16.4 seconds — ABNORMAL HIGH (ref 11.6–15.2)

## 2012-05-07 LAB — HEMOGLOBIN AND HEMATOCRIT, BLOOD
HCT: 30 % — ABNORMAL LOW (ref 39.0–52.0)
Hemoglobin: 11.1 g/dL — ABNORMAL LOW (ref 13.0–17.0)

## 2012-05-07 LAB — GLUCOSE, CAPILLARY
Glucose-Capillary: 133 mg/dL — ABNORMAL HIGH (ref 70–99)
Glucose-Capillary: 326 mg/dL — ABNORMAL HIGH (ref 70–99)

## 2012-05-07 LAB — POCT I-STAT, CHEM 8
Calcium, Ion: 1.16 mmol/L (ref 1.13–1.30)
Glucose, Bld: 127 mg/dL — ABNORMAL HIGH (ref 70–99)
HCT: 39 % (ref 39.0–52.0)
Hemoglobin: 13.3 g/dL (ref 13.0–17.0)

## 2012-05-07 LAB — SURGICAL PCR SCREEN
MRSA, PCR: NEGATIVE
Staphylococcus aureus: NEGATIVE

## 2012-05-07 LAB — MAGNESIUM: Magnesium: 3.3 mg/dL — ABNORMAL HIGH (ref 1.5–2.5)

## 2012-05-07 LAB — BLOOD GAS, ARTERIAL
Acid-base deficit: 0.6 mmol/L (ref 0.0–2.0)
Bicarbonate: 23.6 mEq/L (ref 20.0–24.0)
Drawn by: 28701
FIO2: 0.21 %
O2 Saturation: 95.5 %
Patient temperature: 98.6
TCO2: 24.8 mmol/L (ref 0–100)
pCO2 arterial: 39.1 mmHg (ref 35.0–45.0)
pH, Arterial: 7.397 (ref 7.350–7.450)
pO2, Arterial: 74.6 mmHg — ABNORMAL LOW (ref 80.0–100.0)

## 2012-05-07 LAB — ABO/RH: ABO/RH(D): A POS

## 2012-05-07 LAB — HEMOGLOBIN A1C: Mean Plasma Glucose: 295 mg/dL — ABNORMAL HIGH (ref <117)

## 2012-05-07 LAB — CREATININE, SERUM
Creatinine, Ser: 0.8 mg/dL (ref 0.50–1.35)
GFR calc Af Amer: >90 mL/min (ref >90)
GFR calc non Af Amer: >90 mL/min (ref >90)

## 2012-05-07 LAB — APTT: aPTT: 28 seconds (ref 24–37)

## 2012-05-07 LAB — PLATELET COUNT: Platelets: 176 10*3/uL (ref 150–400)

## 2012-05-07 SURGERY — CORONARY ARTERY BYPASS GRAFTING (CABG)
Anesthesia: General | Site: Chest | Wound class: Clean

## 2012-05-07 MED ORDER — VANCOMYCIN HCL IN DEXTROSE 1-5 GM/200ML-% IV SOLN
1000.0000 mg | Freq: Once | INTRAVENOUS | Status: AC
  Administered 2012-05-07: 1000 mg via INTRAVENOUS
  Filled 2012-05-07: qty 200

## 2012-05-07 MED ORDER — VECURONIUM BROMIDE 10 MG IV SOLR
INTRAVENOUS | Status: DC | PRN
  Administered 2012-05-07: 5 mg via INTRAVENOUS
  Administered 2012-05-07: 3 mg via INTRAVENOUS
  Administered 2012-05-07: 5 mg via INTRAVENOUS
  Administered 2012-05-07: 7 mg via INTRAVENOUS

## 2012-05-07 MED ORDER — ALBUMIN HUMAN 5 % IV SOLN
INTRAVENOUS | Status: DC | PRN
  Administered 2012-05-07: 12:00:00 via INTRAVENOUS

## 2012-05-07 MED ORDER — METOPROLOL TARTRATE 25 MG/10 ML ORAL SUSPENSION
12.5000 mg | Freq: Two times a day (BID) | ORAL | Status: DC
  Filled 2012-05-07 (×7): qty 5

## 2012-05-07 MED ORDER — LIDOCAINE HCL (CARDIAC) 20 MG/ML IV SOLN
INTRAVENOUS | Status: DC | PRN
  Administered 2012-05-07: 60 mg via INTRAVENOUS

## 2012-05-07 MED ORDER — MAGNESIUM SULFATE 40 MG/ML IJ SOLN
4.0000 g | Freq: Once | INTRAMUSCULAR | Status: AC
  Administered 2012-05-07: 4 g via INTRAVENOUS
  Filled 2012-05-07: qty 100

## 2012-05-07 MED ORDER — FAMOTIDINE IN NACL 20-0.9 MG/50ML-% IV SOLN
20.0000 mg | Freq: Two times a day (BID) | INTRAVENOUS | Status: AC
  Administered 2012-05-07: 20 mg via INTRAVENOUS

## 2012-05-07 MED ORDER — PHENYLEPHRINE HCL 10 MG/ML IJ SOLN
0.0000 ug/min | INTRAMUSCULAR | Status: DC
  Administered 2012-05-08: 20 ug/min via INTRAVENOUS
  Filled 2012-05-07 (×2): qty 2

## 2012-05-07 MED ORDER — HEMOSTATIC AGENTS (NO CHARGE) OPTIME
TOPICAL | Status: DC | PRN
  Administered 2012-05-07: 1 via TOPICAL

## 2012-05-07 MED ORDER — DEXMEDETOMIDINE HCL IN NACL 200 MCG/50ML IV SOLN
0.1000 ug/kg/h | INTRAVENOUS | Status: DC
  Administered 2012-05-07: 0.5 ug/kg/h via INTRAVENOUS
  Filled 2012-05-07: qty 50

## 2012-05-07 MED ORDER — NITROGLYCERIN IN D5W 200-5 MCG/ML-% IV SOLN
INTRAVENOUS | Status: DC | PRN
  Administered 2012-05-07: 33 ug/min via INTRAVENOUS

## 2012-05-07 MED ORDER — LEVOFLOXACIN IN D5W 750 MG/150ML IV SOLN
750.0000 mg | INTRAVENOUS | Status: AC
  Administered 2012-05-08: 750 mg via INTRAVENOUS
  Filled 2012-05-07: qty 150

## 2012-05-07 MED ORDER — SODIUM CHLORIDE 0.9 % IV SOLN
10.0000 g | INTRAVENOUS | Status: DC | PRN
  Administered 2012-05-07: 5 g/h via INTRAVENOUS

## 2012-05-07 MED ORDER — PROPOFOL 10 MG/ML IV BOLUS
INTRAVENOUS | Status: DC | PRN
  Administered 2012-05-07: 40 mg via INTRAVENOUS
  Administered 2012-05-07 (×2): 50 mg via INTRAVENOUS

## 2012-05-07 MED ORDER — DEXMEDETOMIDINE HCL IN NACL 200 MCG/50ML IV SOLN
INTRAVENOUS | Status: DC | PRN
  Administered 2012-05-07: .2 ug/kg/h via INTRAVENOUS

## 2012-05-07 MED ORDER — ALBUMIN HUMAN 5 % IV SOLN
250.0000 mL | INTRAVENOUS | Status: AC | PRN
  Administered 2012-05-07: 250 mL via INTRAVENOUS

## 2012-05-07 MED ORDER — BISACODYL 5 MG PO TBEC
10.0000 mg | DELAYED_RELEASE_TABLET | Freq: Every day | ORAL | Status: DC
  Administered 2012-05-09: 10 mg via ORAL
  Filled 2012-05-07: qty 2

## 2012-05-07 MED ORDER — SODIUM CHLORIDE 0.9 % IV SOLN
100.0000 [IU] | INTRAVENOUS | Status: DC | PRN
  Administered 2012-05-07: 3 [IU]/h via INTRAVENOUS

## 2012-05-07 MED ORDER — MORPHINE SULFATE 2 MG/ML IJ SOLN: 1.0000 mg | INTRAMUSCULAR | Status: AC | PRN

## 2012-05-07 MED ORDER — MIDAZOLAM HCL 2 MG/2ML IJ SOLN: 2.0000 mg | INTRAMUSCULAR | Status: DC | PRN

## 2012-05-07 MED ORDER — ROCURONIUM BROMIDE 100 MG/10ML IV SOLN
INTRAVENOUS | Status: DC | PRN
  Administered 2012-05-07: 50 mg via INTRAVENOUS

## 2012-05-07 MED ORDER — MILRINONE IN DEXTROSE 20 MG/100ML IV SOLN
0.1250 ug/kg/min | INTRAVENOUS | Status: DC
  Filled 2012-05-07: qty 100

## 2012-05-07 MED ORDER — SODIUM CHLORIDE 0.9 % IV SOLN: INTRAVENOUS | Status: DC

## 2012-05-07 MED ORDER — LEVOFLOXACIN IN D5W 750 MG/150ML IV SOLN
INTRAVENOUS | Status: DC | PRN
  Administered 2012-05-07: 500 mg via INTRAVENOUS

## 2012-05-07 MED ORDER — 0.9 % SODIUM CHLORIDE (POUR BTL) OPTIME
TOPICAL | Status: DC | PRN
  Administered 2012-05-07: 7000 mL

## 2012-05-07 MED ORDER — DEXTROSE 5 % IV SOLN
INTRAVENOUS | Status: DC | PRN
  Administered 2012-05-07 (×2): via INTRAVENOUS

## 2012-05-07 MED ORDER — MILRINONE IN DEXTROSE 20 MG/100ML IV SOLN
INTRAVENOUS | Status: DC | PRN
  Administered 2012-05-07: .3 ug/kg/min via INTRAVENOUS

## 2012-05-07 MED ORDER — AMIODARONE LOAD VIA INFUSION
INTRAVENOUS | Status: DC | PRN
  Administered 2012-05-07: 150 mg via INTRAVENOUS

## 2012-05-07 MED ORDER — LACTATED RINGERS IV SOLN
INTRAVENOUS | Status: DC | PRN
  Administered 2012-05-07 (×2): via INTRAVENOUS

## 2012-05-07 MED ORDER — SODIUM CHLORIDE 0.9 % IV SOLN: 250.0000 mL | INTRAVENOUS | Status: DC

## 2012-05-07 MED ORDER — MILRINONE IN DEXTROSE 20 MG/100ML IV SOLN
0.3000 ug/kg/min | INTRAVENOUS | Status: DC
  Administered 2012-05-07 – 2012-05-08 (×2): 0.3 ug/kg/min via INTRAVENOUS
  Filled 2012-05-07: qty 100

## 2012-05-07 MED ORDER — MILRINONE LOAD VIA INFUSION
INTRAVENOUS | Status: DC | PRN
  Administered 2012-05-07: 5000 ug via INTRAVENOUS

## 2012-05-07 MED ORDER — ASPIRIN 81 MG PO CHEW: 324.0000 mg | CHEWABLE_TABLET | Freq: Every day | ORAL | Status: DC

## 2012-05-07 MED ORDER — METOPROLOL TARTRATE 1 MG/ML IV SOLN: 2.5000 mg | INTRAVENOUS | Status: DC | PRN

## 2012-05-07 MED ORDER — DOPAMINE-DEXTROSE 1.6-5 MG/ML-% IV SOLN
INTRAVENOUS | Status: DC | PRN
  Administered 2012-05-07: 2.5 ug/kg/min via INTRAVENOUS

## 2012-05-07 MED ORDER — LEVOFLOXACIN IN D5W 500 MG/100ML IV SOLN: 500.0000 mg | INTRAVENOUS | Status: DC

## 2012-05-07 MED ORDER — OXYCODONE HCL 5 MG PO TABS
5.0000 mg | ORAL_TABLET | ORAL | Status: DC | PRN
  Administered 2012-05-07 – 2012-05-08 (×2): 10 mg via ORAL
  Administered 2012-05-09: 5 mg via ORAL
  Filled 2012-05-07: qty 2
  Filled 2012-05-07: qty 1
  Filled 2012-05-07: qty 2

## 2012-05-07 MED ORDER — SODIUM CHLORIDE 0.9 % IV SOLN
INTRAVENOUS | Status: DC
  Administered 2012-05-07: 6.9 [IU]/h via INTRAVENOUS
  Administered 2012-05-08: 4.6 [IU]/h via INTRAVENOUS
  Filled 2012-05-07 (×3): qty 1

## 2012-05-07 MED ORDER — BISACODYL 10 MG RE SUPP: 10.0000 mg | Freq: Every day | RECTAL | Status: DC

## 2012-05-07 MED ORDER — LACTATED RINGERS IV SOLN
INTRAVENOUS | Status: DC | PRN
  Administered 2012-05-07 (×2): via INTRAVENOUS

## 2012-05-07 MED ORDER — METOPROLOL TARTRATE 12.5 MG HALF TABLET
12.5000 mg | ORAL_TABLET | Freq: Two times a day (BID) | ORAL | Status: DC
  Administered 2012-05-08 – 2012-05-09 (×2): 12.5 mg via ORAL
  Filled 2012-05-07 (×7): qty 1

## 2012-05-07 MED ORDER — ACETAMINOPHEN 500 MG PO TABS
1000.0000 mg | ORAL_TABLET | Freq: Four times a day (QID) | ORAL | Status: DC
  Administered 2012-05-07 – 2012-05-10 (×9): 1000 mg via ORAL
  Filled 2012-05-07 (×15): qty 2

## 2012-05-07 MED ORDER — AMIODARONE HCL IN DEXTROSE 360-4.14 MG/200ML-% IV SOLN
30.0000 mg/h | INTRAVENOUS | Status: DC
  Administered 2012-05-07 – 2012-05-08 (×3): 30 mg/h via INTRAVENOUS
  Filled 2012-05-07 (×7): qty 200

## 2012-05-07 MED ORDER — ARTIFICIAL TEARS OP OINT
TOPICAL_OINTMENT | OPHTHALMIC | Status: DC | PRN
  Administered 2012-05-07: 1 via OPHTHALMIC

## 2012-05-07 MED ORDER — NITROGLYCERIN IN D5W 200-5 MCG/ML-% IV SOLN: 0.0000 ug/min | INTRAVENOUS | Status: DC

## 2012-05-07 MED ORDER — SODIUM CHLORIDE 0.9 % IV SOLN
INTRAVENOUS | Status: DC | PRN
  Administered 2012-05-07: 12:00:00 via INTRAVENOUS

## 2012-05-07 MED ORDER — PHENYLEPHRINE HCL 10 MG/ML IJ SOLN
30.0000 ug/min | INTRAVENOUS | Status: DC
  Filled 2012-05-07: qty 4

## 2012-05-07 MED ORDER — INSULIN REGULAR BOLUS VIA INFUSION
0.0000 [IU] | Freq: Three times a day (TID) | INTRAVENOUS | Status: DC
  Administered 2012-05-09: 4.2 [IU] via INTRAVENOUS
  Administered 2012-05-09: 2.3 [IU] via INTRAVENOUS
  Filled 2012-05-07: qty 10

## 2012-05-07 MED ORDER — ASPIRIN EC 325 MG PO TBEC
325.0000 mg | DELAYED_RELEASE_TABLET | Freq: Every day | ORAL | Status: DC
  Administered 2012-05-08 – 2012-05-09 (×2): 325 mg via ORAL
  Filled 2012-05-07 (×3): qty 1

## 2012-05-07 MED ORDER — FENTANYL CITRATE 0.05 MG/ML IJ SOLN
INTRAMUSCULAR | Status: DC | PRN
  Administered 2012-05-07: 250 ug via INTRAVENOUS
  Administered 2012-05-07: 50 ug via INTRAVENOUS
  Administered 2012-05-07: 250 ug via INTRAVENOUS
  Administered 2012-05-07: 750 ug via INTRAVENOUS
  Administered 2012-05-07: 50 ug via INTRAVENOUS

## 2012-05-07 MED ORDER — LACTATED RINGERS IV SOLN
INTRAVENOUS | Status: DC
  Administered 2012-05-07: 14:00:00 via INTRAVENOUS

## 2012-05-07 MED ORDER — DOCUSATE SODIUM 100 MG PO CAPS
200.0000 mg | ORAL_CAPSULE | Freq: Every day | ORAL | Status: DC
  Administered 2012-05-09: 200 mg via ORAL
  Filled 2012-05-07: qty 2

## 2012-05-07 MED ORDER — PANTOPRAZOLE SODIUM 40 MG PO TBEC
40.0000 mg | DELAYED_RELEASE_TABLET | Freq: Every day | ORAL | Status: DC
  Administered 2012-05-08 – 2012-05-09 (×2): 40 mg via ORAL
  Filled 2012-05-07 (×2): qty 1

## 2012-05-07 MED ORDER — DOPAMINE-DEXTROSE 3.2-5 MG/ML-% IV SOLN: 0.0000 ug/kg/min | INTRAVENOUS | Status: DC

## 2012-05-07 MED ORDER — SODIUM CHLORIDE 0.9 % IJ SOLN
OROMUCOSAL | Status: DC | PRN
  Administered 2012-05-07 (×3): via TOPICAL

## 2012-05-07 MED ORDER — AMIODARONE HCL IN DEXTROSE 360-4.14 MG/200ML-% IV SOLN
INTRAVENOUS | Status: DC | PRN
  Administered 2012-05-07: 60 mg/h via INTRAVENOUS

## 2012-05-07 MED ORDER — SODIUM CHLORIDE 0.9 % IJ SOLN
3.0000 mL | Freq: Two times a day (BID) | INTRAMUSCULAR | Status: DC
  Administered 2012-05-08 – 2012-05-09 (×3): 3 mL via INTRAVENOUS

## 2012-05-07 MED ORDER — SODIUM CHLORIDE 0.9 % IJ SOLN: 3.0000 mL | INTRAMUSCULAR | Status: DC | PRN

## 2012-05-07 MED ORDER — LACTATED RINGERS IV SOLN: 500.0000 mL | Freq: Once | INTRAVENOUS | Status: AC | PRN

## 2012-05-07 MED ORDER — PANTOPRAZOLE SODIUM 40 MG PO TBEC: 40.0000 mg | DELAYED_RELEASE_TABLET | Freq: Every day | ORAL | Status: DC

## 2012-05-07 MED ORDER — ACETAMINOPHEN 10 MG/ML IV SOLN
1000.0000 mg | Freq: Once | INTRAVENOUS | Status: AC
  Administered 2012-05-07: 1000 mg via INTRAVENOUS
  Filled 2012-05-07: qty 100

## 2012-05-07 MED ORDER — POTASSIUM CHLORIDE 10 MEQ/50ML IV SOLN
10.0000 meq | INTRAVENOUS | Status: AC
  Administered 2012-05-07 (×3): 10 meq via INTRAVENOUS

## 2012-05-07 MED ORDER — ACETAMINOPHEN 160 MG/5ML PO SOLN: 975.0000 mg | Freq: Four times a day (QID) | ORAL | Status: DC

## 2012-05-07 MED ORDER — AMIODARONE HCL IN DEXTROSE 360-4.14 MG/200ML-% IV SOLN
60.0000 mg/h | INTRAVENOUS | Status: AC
  Administered 2012-05-07: 60 mg/h via INTRAVENOUS
  Filled 2012-05-07: qty 200

## 2012-05-07 MED ORDER — SODIUM CHLORIDE 0.45 % IV SOLN
INTRAVENOUS | Status: DC
  Administered 2012-05-07: 20 mL/h via INTRAVENOUS
  Administered 2012-05-08: 13:00:00 via INTRAVENOUS

## 2012-05-07 MED ORDER — MIDAZOLAM HCL 5 MG/5ML IJ SOLN
INTRAMUSCULAR | Status: DC | PRN
  Administered 2012-05-07 (×2): 2 mg via INTRAVENOUS
  Administered 2012-05-07: 3 mg via INTRAVENOUS
  Administered 2012-05-07: 5 mg via INTRAVENOUS

## 2012-05-07 MED ORDER — AMIODARONE HCL IN DEXTROSE 360-4.14 MG/200ML-% IV SOLN
60.0000 mg/h | INTRAVENOUS | Status: DC
  Filled 2012-05-07: qty 200

## 2012-05-07 MED ORDER — ONDANSETRON HCL 4 MG/2ML IJ SOLN
4.0000 mg | Freq: Four times a day (QID) | INTRAMUSCULAR | Status: DC | PRN
  Administered 2012-05-08 (×2): 4 mg via INTRAVENOUS
  Filled 2012-05-07 (×2): qty 2

## 2012-05-07 MED ORDER — PHENYLEPHRINE HCL 10 MG/ML IJ SOLN
20.0000 mg | INTRAVENOUS | Status: DC | PRN
  Administered 2012-05-07: 25 ug/min via INTRAVENOUS

## 2012-05-07 MED ORDER — PROTAMINE SULFATE 10 MG/ML IV SOLN
INTRAVENOUS | Status: DC | PRN
  Administered 2012-05-07: 25 mg via INTRAVENOUS
  Administered 2012-05-07: 130 mg via INTRAVENOUS
  Administered 2012-05-07: 175 mg via INTRAVENOUS

## 2012-05-07 MED ORDER — HEPARIN SODIUM (PORCINE) 1000 UNIT/ML IJ SOLN
INTRAMUSCULAR | Status: DC | PRN
  Administered 2012-05-07: 45000 [IU] via INTRAVENOUS

## 2012-05-07 MED ORDER — MORPHINE SULFATE 2 MG/ML IJ SOLN
2.0000 mg | INTRAMUSCULAR | Status: DC | PRN
  Administered 2012-05-07: 2 mg via INTRAVENOUS
  Administered 2012-05-08: 4 mg via INTRAVENOUS
  Administered 2012-05-08 (×6): 2 mg via INTRAVENOUS
  Filled 2012-05-07: qty 2
  Filled 2012-05-07 (×7): qty 1

## 2012-05-07 SURGICAL SUPPLY — 114 items
ATTRACTOMAT 16X20 MAGNETIC DRP (DRAPES) ×3 IMPLANT
BAG DECANTER FOR FLEXI CONT (MISCELLANEOUS) ×3 IMPLANT
BANDAGE ELASTIC 4 VELCRO ST LF (GAUZE/BANDAGES/DRESSINGS) ×3 IMPLANT
BANDAGE ELASTIC 6 VELCRO ST LF (GAUZE/BANDAGES/DRESSINGS) ×3 IMPLANT
BANDAGE GAUZE ELAST BULKY 4 IN (GAUZE/BANDAGES/DRESSINGS) ×3 IMPLANT
BLADE STERNUM SYSTEM 6 (BLADE) ×3 IMPLANT
BLADE SURG 11 STRL SS (BLADE) ×3 IMPLANT
BLADE SURG ROTATE 9660 (MISCELLANEOUS) IMPLANT
CANISTER SUCTION 2500CC (MISCELLANEOUS) ×3 IMPLANT
CANN PRFSN .5XCNCT 15X34-48 (MISCELLANEOUS) ×2
CANNULA AORTIC HI-FLOW 6.5M20F (CANNULA) ×3 IMPLANT
CANNULA PRFSN .5XCNCT 15X34-48 (MISCELLANEOUS) ×2 IMPLANT
CANNULA VEN 2 STAGE (MISCELLANEOUS) ×1
CATH CPB KIT GERHARDT (MISCELLANEOUS) ×3 IMPLANT
CATH THORACIC 28FR (CATHETERS) ×3 IMPLANT
CATH THORACIC 36FR (CATHETERS) IMPLANT
CATH THORACIC 36FR RT ANG (CATHETERS) IMPLANT
CLIP RETRACTION 3.0MM CORONARY (MISCELLANEOUS) ×3 IMPLANT
CLIP TI MEDIUM 24 (CLIP) IMPLANT
CLIP TI WIDE RED SMALL 24 (CLIP) IMPLANT
CLOTH BEACON ORANGE TIMEOUT ST (SAFETY) ×3 IMPLANT
COVER SURGICAL LIGHT HANDLE (MISCELLANEOUS) ×3 IMPLANT
CRADLE DONUT ADULT HEAD (MISCELLANEOUS) ×3 IMPLANT
DERMABOND ADVANCED (GAUZE/BANDAGES/DRESSINGS) ×1
DERMABOND ADVANCED .7 DNX12 (GAUZE/BANDAGES/DRESSINGS) ×2 IMPLANT
DRAIN CHANNEL 28F RND 3/8 FF (WOUND CARE) ×3 IMPLANT
DRAPE CARDIOVASCULAR INCISE (DRAPES) ×1
DRAPE SLUSH/WARMER DISC (DRAPES) ×3 IMPLANT
DRAPE SRG 135X102X78XABS (DRAPES) ×2 IMPLANT
DRSG COVADERM 4X14 (GAUZE/BANDAGES/DRESSINGS) ×3 IMPLANT
ELECT BLADE 4.0 EZ CLEAN MEGAD (MISCELLANEOUS) ×3
ELECT CAUTERY BLADE 6.4 (BLADE) ×3 IMPLANT
ELECT REM PT RETURN 9FT ADLT (ELECTROSURGICAL) ×6
ELECTRODE BLDE 4.0 EZ CLN MEGD (MISCELLANEOUS) ×2 IMPLANT
ELECTRODE REM PT RTRN 9FT ADLT (ELECTROSURGICAL) ×4 IMPLANT
GLOVE BIO SURGEON STRL SZ 6 (GLOVE) IMPLANT
GLOVE BIO SURGEON STRL SZ 6.5 (GLOVE) ×30 IMPLANT
GLOVE BIO SURGEON STRL SZ7 (GLOVE) IMPLANT
GLOVE BIO SURGEON STRL SZ7.5 (GLOVE) IMPLANT
GLOVE BIO SURGEON STRL SZ8 (GLOVE) ×6 IMPLANT
GLOVE BIOGEL PI IND STRL 6 (GLOVE) IMPLANT
GLOVE BIOGEL PI IND STRL 6.5 (GLOVE) IMPLANT
GLOVE BIOGEL PI IND STRL 7.0 (GLOVE) ×4 IMPLANT
GLOVE BIOGEL PI INDICATOR 6 (GLOVE)
GLOVE BIOGEL PI INDICATOR 6.5 (GLOVE)
GLOVE BIOGEL PI INDICATOR 7.0 (GLOVE) ×2
GOWN STRL NON-REIN LRG LVL3 (GOWN DISPOSABLE) ×21 IMPLANT
HEMOSTAT POWDER SURGIFOAM 1G (HEMOSTASIS) ×9 IMPLANT
HEMOSTAT SURGICEL 2X14 (HEMOSTASIS) ×3 IMPLANT
INSERT FOGARTY 61MM (MISCELLANEOUS) IMPLANT
INSERT FOGARTY XLG (MISCELLANEOUS) IMPLANT
KIT BASIN OR (CUSTOM PROCEDURE TRAY) ×3 IMPLANT
KIT ROOM TURNOVER OR (KITS) ×3 IMPLANT
KIT SUCTION CATH 14FR (SUCTIONS) ×6 IMPLANT
KIT VASOVIEW W/TROCAR VH 2000 (KITS) ×3 IMPLANT
LEAD PACING MYOCARDI (MISCELLANEOUS) ×3 IMPLANT
MARKER GRAFT CORONARY BYPASS (MISCELLANEOUS) ×9 IMPLANT
NS IRRIG 1000ML POUR BTL (IV SOLUTION) ×21 IMPLANT
PACK OPEN HEART (CUSTOM PROCEDURE TRAY) ×3 IMPLANT
PAD ARMBOARD 7.5X6 YLW CONV (MISCELLANEOUS) ×6 IMPLANT
PENCIL BUTTON HOLSTER BLD 10FT (ELECTRODE) ×3 IMPLANT
PUNCH AORTIC ROTATE 4.0MM (MISCELLANEOUS) ×3 IMPLANT
PUNCH AORTIC ROTATE 4.5MM 8IN (MISCELLANEOUS) IMPLANT
PUNCH AORTIC ROTATE 5MM 8IN (MISCELLANEOUS) IMPLANT
SET CARDIOPLEGIA MPS 5001102 (MISCELLANEOUS) ×3 IMPLANT
SOLUTION ANTI FOG 6CC (MISCELLANEOUS) IMPLANT
SPONGE GAUZE 4X4 12PLY (GAUZE/BANDAGES/DRESSINGS) ×6 IMPLANT
SPONGE LAP 18X18 X RAY DECT (DISPOSABLE) ×9 IMPLANT
SPONGE LAP 4X18 X RAY DECT (DISPOSABLE) IMPLANT
SUT BONE WAX W31G (SUTURE) ×3 IMPLANT
SUT MNCRL AB 4-0 PS2 18 (SUTURE) ×3 IMPLANT
SUT PROLENE 3 0 SH DA (SUTURE) IMPLANT
SUT PROLENE 3 0 SH1 36 (SUTURE) ×9 IMPLANT
SUT PROLENE 4 0 RB 1 (SUTURE)
SUT PROLENE 4 0 SH DA (SUTURE) IMPLANT
SUT PROLENE 4 0 TF (SUTURE) ×6 IMPLANT
SUT PROLENE 4-0 RB1 .5 CRCL 36 (SUTURE) IMPLANT
SUT PROLENE 5 0 C 1 36 (SUTURE) IMPLANT
SUT PROLENE 6 0 C 1 30 (SUTURE) ×3 IMPLANT
SUT PROLENE 6 0 CC (SUTURE) ×6 IMPLANT
SUT PROLENE 7 0 BV 1 (SUTURE) IMPLANT
SUT PROLENE 7 0 BV1 MDA (SUTURE) ×6 IMPLANT
SUT PROLENE 7.0 RB 3 (SUTURE) ×6 IMPLANT
SUT PROLENE 8 0 BV175 6 (SUTURE) ×6 IMPLANT
SUT SILK  1 MH (SUTURE)
SUT SILK 1 MH (SUTURE) IMPLANT
SUT SILK 2 0 (SUTURE) ×1
SUT SILK 2 0 SH CR/8 (SUTURE) IMPLANT
SUT SILK 2-0 18XBRD TIE 12 (SUTURE) ×2 IMPLANT
SUT SILK 3 0 SH CR/8 (SUTURE) IMPLANT
SUT STEEL 6MS V (SUTURE) ×3 IMPLANT
SUT STEEL STERNAL CCS#1 18IN (SUTURE) IMPLANT
SUT STEEL SZ 6 DBL 3X14 BALL (SUTURE) ×3 IMPLANT
SUT VIC AB 1 CTX 18 (SUTURE) ×6 IMPLANT
SUT VIC AB 1 CTX 36 (SUTURE)
SUT VIC AB 1 CTX36XBRD ANBCTR (SUTURE) IMPLANT
SUT VIC AB 2-0 CT1 27 (SUTURE) ×1
SUT VIC AB 2-0 CT1 TAPERPNT 27 (SUTURE) ×2 IMPLANT
SUT VIC AB 2-0 CTX 27 (SUTURE) IMPLANT
SUT VIC AB 3-0 SH 27 (SUTURE)
SUT VIC AB 3-0 SH 27X BRD (SUTURE) IMPLANT
SUT VIC AB 3-0 X1 27 (SUTURE) IMPLANT
SUT VICRYL 4-0 PS2 18IN ABS (SUTURE) IMPLANT
SUTURE E-PAK OPEN HEART (SUTURE) ×3 IMPLANT
SYSTEM SAHARA CHEST DRAIN ATS (WOUND CARE) ×6 IMPLANT
TAPE CLOTH SURG 4X10 WHT LF (GAUZE/BANDAGES/DRESSINGS) ×3 IMPLANT
TOWEL OR 17X24 6PK STRL BLUE (TOWEL DISPOSABLE) ×6 IMPLANT
TOWEL OR 17X26 10 PK STRL BLUE (TOWEL DISPOSABLE) ×6 IMPLANT
TRAY FOLEY IC TEMP SENS 14FR (CATHETERS) ×3 IMPLANT
TUBE FEEDING 8FR 16IN STR KANG (MISCELLANEOUS) ×3 IMPLANT
TUBE SUCT INTRACARD DLP 20F (MISCELLANEOUS) ×3 IMPLANT
TUBING INSUFFLATION 10FT LAP (TUBING) ×3 IMPLANT
UNDERPAD 30X30 INCONTINENT (UNDERPADS AND DIAPERS) ×3 IMPLANT
WATER STERILE IRR 1000ML POUR (IV SOLUTION) ×6 IMPLANT

## 2012-05-07 NOTE — Preoperative (Signed)
Beta Blockers   Reason not to administer Beta Blockers:Not Applicable 

## 2012-05-07 NOTE — Progress Notes (Signed)
Dr. Tyrone Sage in to see patient, discuss surgery, and review chart and cath CD from [place].  Alonza Bogus

## 2012-05-07 NOTE — Progress Notes (Signed)
  Echocardiogram Echocardiogram Transesophageal has been performed.  Nyasha Rahilly 05/07/2012, 9:26 AM

## 2012-05-07 NOTE — Brief Op Note (Addendum)
05/06/2012 - 05/07/2012  10:54 AM  PATIENT:  Philip [name]  67 y.o. male  PRE-OPERATIVE DIAGNOSIS:  Coronary Artery Disease  POST-OPERATIVE DIAGNOSIS:  Coronary Artery Disease  PROCEDURE:  INTRAOPERATIVE TRANSESOPHAGEAL ECHOCARDIOGRAM , CORONARY ARTERY BYPASS GRAFTING (CABG)x 3 (LIMA to LAD, SVG to Circumflex, and SVG to RCA) with EVH from the left thigh   SURGEON:  Surgeon(s) and Role:    * Delight Ovens, MD - Primary  PHYSICIAN ASSISTANT: Doree Fudge [name]  ANESTHESIA:   general  EBL:  Total I/O In: 1650 [I.V.:1650] Out: 305 [Urine:305]   DRAINS:  Chest Tube(s) in the Mediastinal and pleural spaces    COUNTS CORRECT:  YES  DICTATION: .Dragon Dictation  PLAN OF CARE: Admit to inpatient   PATIENT DISPOSITION:  ICU - intubated and hemodynamically stable.   Delay start of Pharmacological VTE agent (>24hrs) due to surgical blood loss or risk of bleeding: yes  PRE OP WEIGHT: 99 kg

## 2012-05-07 NOTE — Procedures (Signed)
Extubation Procedure Note  Patient Details:   Name: Philip [name] DOB: November 22, 1945 MRN: [id number]   Airway Documentation:     Evaluation  O2 sats: stable throughout Complications: No apparent complications Patient did tolerate procedure well. Bilateral Breath Sounds: Clear   Yes nif-32 and FVC  Newt Lukes 05/07/2012, 6:10 PM

## 2012-05-07 NOTE — Anesthesia Postprocedure Evaluation (Signed)
  Anesthesia Post-op Note  Patient: Philip [name]  Procedure(s) Performed: Procedure(s) (LRB) with comments: CORONARY ARTERY BYPASS GRAFTING (CABG) (N/A) - times three INTRAOPERATIVE TRANSESOPHAGEAL ECHOCARDIOGRAM (N/A)  Patient Location: SICU  Anesthesia Type:General  Level of Consciousness: sedated and Patient remains intubated per anesthesia plan  Airway and Oxygen Therapy: Patient remains intubated per anesthesia plan and Patient placed on Ventilator (see vital sign flow sheet for setting)  Post-op Pain: none  Post-op Assessment: Post-op Vital signs reviewed, Patient's Cardiovascular Status Stable, Respiratory Function Stable, Patent Airway, No signs of Nausea or vomiting and Pain level controlled  Post-op Vital Signs: stable  Complications: No apparent anesthesia complications

## 2012-05-07 NOTE — Anesthesia Preprocedure Evaluation (Addendum)
Anesthesia Evaluation    History of Anesthesia Complications (+) AWARENESS UNDER ANESTHESIA  Airway Mallampati: I TM Distance: >3 FB Neck ROM: full    Dental   Pulmonary shortness of breath, former smoker,          Cardiovascular + angina + CAD  ECHO 05-06-12 ------------------------------------------------------------ Study Conclusions  - Left ventricle: The cavity size was normal. Wall thickness   was normal. Systolic function was normal. The estimated   ejection fraction was in the range of 55% to 60%. Doppler   parameters are consistent with abnormal left ventricular   relaxation (grade 1 diastolic dysfunction). - Pulmonary arteries: PA peak pressure: 33mm Hg (S). Impressions:  07-May-2012 05:02:16 [facility] ROUTINE RECORD Sinus bradycardia with sinus arrhythmia Otherwise normal ECG   Neuro/Psych    GI/Hepatic   Endo/Other  diabetes, Type 2, Oral Hypoglycemic AgentsHypothyroidism   Renal/GU      Musculoskeletal   Abdominal   Peds  Hematology   Anesthesia Other Findings   Reproductive/Obstetrics                         Anesthesia Physical Anesthesia Plan  ASA: III  Anesthesia Plan: General   Post-op Pain Management:    Induction: Intravenous  Airway Management Planned: Oral ETT  Additional Equipment: Arterial line, CVP, PA Cath and TEE  Intra-op Plan:   Post-operative Plan: Post-operative intubation/ventilation  Informed Consent:   Plan Discussed with: CRNA, Anesthesiologist and Surgeon  Anesthesia Plan Comments:         Anesthesia Quick Evaluation

## 2012-05-07 NOTE — OR Nursing (Signed)
During foley insertion noted tip of penis to be red in color with white yeasty appearance.  Foreskin difficult to retract back.  Doree Fudge PA aware.

## 2012-05-07 NOTE — Transfer of Care (Signed)
Immediate Anesthesia Transfer of Care Note  Patient: Philip [name]  Procedure(s) Performed: Procedure(s) (LRB) with comments: CORONARY ARTERY BYPASS GRAFTING (CABG) (N/A) - times three INTRAOPERATIVE TRANSESOPHAGEAL ECHOCARDIOGRAM (N/A)  Patient Location: PACU and SICU  Anesthesia Type:General  Level of Consciousness: unresponsive  Airway & Oxygen Therapy: Patient remains intubated per anesthesia plan  Post-op Assessment: Report given to PACU RN and Post -op Vital signs reviewed and stable  Post vital signs: Reviewed and stable  Complications: No apparent anesthesia complications

## 2012-05-07 NOTE — Plan of Care (Signed)
Problem: Phase II Progression Outcomes Goal: Cardiac index > or equal to 1.8 Outcome: Progressing CI > with dopamine and milrinone Goal: CBGs/Blood glucose < or equal to 120 Outcome: Progressing Continues on insulin drip

## 2012-05-07 NOTE — OR Nursing (Signed)
2300 Charge RN called at 1210 for 45 minute call; spoke with Wynona Canes.  Spoke with Bill NS at 1239 for 25 minute call.

## 2012-05-07 NOTE — Progress Notes (Signed)
Airway 8.5 removed at this time. Patient met parameters.

## 2012-05-07 NOTE — Progress Notes (Signed)
Patient transported to OR via stretcher.  Philip [name]

## 2012-05-07 NOTE — Anesthesia Procedure Notes (Signed)
Procedure Name: Intubation Date/Time: 05/07/2012 7:35 AM Performed by: Tyrone Nine Pre-anesthesia Checklist: Patient identified, Timeout performed, Emergency Drugs available, Suction available and Patient being monitored Patient Re-evaluated:Patient Re-evaluated prior to inductionOxygen Delivery Method: Circle system utilized Preoxygenation: Pre-oxygenation with 100% oxygen Intubation Type: IV induction Ventilation: Mask ventilation without difficulty Laryngoscope Size: Mac and 3 Grade View: Grade I Tube type: Oral Number of attempts: 1 Airway Equipment and Method: Stylet Placement Confirmation: ETT inserted through vocal cords under direct vision,  positive ETCO2,  CO2 detector and breath sounds checked- equal and bilateral Secured at: 23 cm Tube secured with: Tape Dental Injury: Teeth and Oropharynx as per pre-operative assessment

## 2012-05-08 ENCOUNTER — Inpatient Hospital Stay (HOSPITAL_COMMUNITY): Payer: Medicare Other

## 2012-05-08 ENCOUNTER — Encounter (HOSPITAL_COMMUNITY): Payer: Self-pay | Admitting: Cardiothoracic Surgery

## 2012-05-08 DIAGNOSIS — IMO0001 Reserved for inherently not codable concepts without codable children: Secondary | ICD-10-CM

## 2012-05-08 DIAGNOSIS — E1165 Type 2 diabetes mellitus with hyperglycemia: Secondary | ICD-10-CM

## 2012-05-08 LAB — GLUCOSE, CAPILLARY
Glucose-Capillary: 126 mg/dL — ABNORMAL HIGH (ref 70–99)
Glucose-Capillary: 144 mg/dL — ABNORMAL HIGH (ref 70–99)
Glucose-Capillary: 132 mg/dL — ABNORMAL HIGH (ref 70–99)
Glucose-Capillary: 119 mg/dL — ABNORMAL HIGH (ref 70–99)
Glucose-Capillary: 118 mg/dL — ABNORMAL HIGH (ref 70–99)
Glucose-Capillary: 117 mg/dL — ABNORMAL HIGH (ref 70–99)
Glucose-Capillary: 124 mg/dL — ABNORMAL HIGH (ref 70–99)
Glucose-Capillary: 127 mg/dL — ABNORMAL HIGH (ref 70–99)
Glucose-Capillary: 101 mg/dL — ABNORMAL HIGH (ref 70–99)

## 2012-05-08 LAB — CBC
HCT: 37.1 % — ABNORMAL LOW (ref 39.0–52.0)
Hemoglobin: 13.3 g/dL (ref 13.0–17.0)
MCH: 30.1 pg (ref 26.0–34.0)
MCH: 31.3 pg (ref 26.0–34.0)
MCHC: 36.9 g/dL — ABNORMAL HIGH (ref 30.0–36.0)
MCV: 83.9 fL (ref 78.0–100.0)
Platelets: 148 10*3/uL — ABNORMAL LOW (ref 150–400)
RBC: 4.42 MIL/uL (ref 4.22–5.81)
RDW: 12.6 % (ref 11.5–15.5)
WBC: 14.9 10*3/uL — ABNORMAL HIGH (ref 4.0–10.5)

## 2012-05-08 LAB — CREATININE, SERUM: Creatinine, Ser: 0.84 mg/dL (ref 0.50–1.35)

## 2012-05-08 LAB — BASIC METABOLIC PANEL
BUN: 16 mg/dL (ref 6–23)
CO2: 27 mEq/L (ref 19–32)
Calcium: 7.9 mg/dL — ABNORMAL LOW (ref 8.4–10.5)
Chloride: 104 mEq/L (ref 96–112)
Creatinine, Ser: 0.78 mg/dL (ref 0.50–1.35)
Glucose, Bld: 126 mg/dL — ABNORMAL HIGH (ref 70–99)

## 2012-05-08 LAB — POCT I-STAT, CHEM 8
Calcium, Ion: 1.17 mmol/L (ref 1.13–1.30)
Glucose, Bld: 148 mg/dL — ABNORMAL HIGH (ref 70–99)
HCT: 37 % — ABNORMAL LOW (ref 39.0–52.0)
Hemoglobin: 12.6 g/dL — ABNORMAL LOW (ref 13.0–17.0)

## 2012-05-08 LAB — MAGNESIUM: Magnesium: 2.2 mg/dL (ref 1.5–2.5)

## 2012-05-08 LAB — POCT I-STAT GLUCOSE: Operator id: 3402

## 2012-05-08 MED ORDER — POTASSIUM CHLORIDE 10 MEQ/50ML IV SOLN
10.0000 meq | INTRAVENOUS | Status: AC
  Administered 2012-05-08 (×3): 10 meq via INTRAVENOUS

## 2012-05-08 MED ORDER — FUROSEMIDE 10 MG/ML IJ SOLN
40.0000 mg | Freq: Once | INTRAMUSCULAR | Status: AC
  Administered 2012-05-08: 40 mg via INTRAVENOUS

## 2012-05-08 MED ORDER — HYDROMORPHONE HCL PF 1 MG/ML IJ SOLN: 0.2500 mg | INTRAMUSCULAR | Status: DC | PRN

## 2012-05-08 MED ORDER — ENOXAPARIN SODIUM 30 MG/0.3ML ~~LOC~~ SOLN: 30.0000 mg | SUBCUTANEOUS | Status: DC

## 2012-05-08 MED ORDER — ONDANSETRON HCL 4 MG/2ML IJ SOLN: 4.0000 mg | Freq: Once | INTRAMUSCULAR | Status: AC | PRN

## 2012-05-08 MED ORDER — POTASSIUM CHLORIDE 10 MEQ/50ML IV SOLN
INTRAVENOUS | Status: AC
  Filled 2012-05-08: qty 150

## 2012-05-08 MED ORDER — INSULIN DETEMIR 100 UNIT/ML ~~LOC~~ SOLN
23.0000 [IU] | Freq: Every day | SUBCUTANEOUS | Status: DC
  Administered 2012-05-08 – 2012-05-12 (×5): 23 [IU] via SUBCUTANEOUS
  Filled 2012-05-08 (×3): qty 10

## 2012-05-08 MED ORDER — ENOXAPARIN SODIUM 30 MG/0.3ML ~~LOC~~ SOLN
30.0000 mg | Freq: Every day | SUBCUTANEOUS | Status: DC
  Administered 2012-05-08 – 2012-05-12 (×5): 30 mg via SUBCUTANEOUS
  Filled 2012-05-08 (×7): qty 0.3

## 2012-05-08 MED ORDER — MECLIZINE HCL 25 MG PO TABS
25.0000 mg | ORAL_TABLET | Freq: Three times a day (TID) | ORAL | Status: DC | PRN
  Administered 2012-05-09 (×2): 25 mg via ORAL
  Filled 2012-05-08 (×3): qty 1

## 2012-05-08 MED ORDER — LIVING WELL WITH DIABETES BOOK
Freq: Once | Status: AC
  Administered 2012-05-08: 21:00:00
  Filled 2012-05-08: qty 1

## 2012-05-08 MED ORDER — INSULIN ASPART 100 UNIT/ML ~~LOC~~ SOLN
0.0000 [IU] | SUBCUTANEOUS | Status: DC
  Administered 2012-05-08 – 2012-05-09 (×4): 2 [IU] via SUBCUTANEOUS

## 2012-05-08 MED FILL — Magnesium Sulfate Inj 50%: INTRAMUSCULAR | Qty: 10 | Status: AC

## 2012-05-08 MED FILL — Dexmedetomidine HCl IV Soln 200 MCG/2ML: INTRAVENOUS | Qty: 2 | Status: AC

## 2012-05-08 NOTE — Progress Notes (Signed)
C/o vertigo and nausea  BP 109/69  Pulse 80  Temp 98.2 F (36.8 C) (Oral)  Resp 18  Ht 5\' 11"  (1.803 m)  Wt 230 lb 3.2 oz (104.418 kg)  BMI 32.11 kg/m2  SpO2 97%   Intake/Output Summary (Last 24 hours) at 05/08/12 1850 Last data filed at 05/08/12 1600  Gross per 24 hour  Intake 2626.28 ml  Output   2545 ml  Net  81.28 ml    Stable day- will try meclizine

## 2012-05-08 NOTE — Progress Notes (Signed)
   SUBJECTIVE: Doing reasonably well 1 day post CABG. He is awake and alert. Complains of dizziness when he opens eyes but otherwise neurologically intact.    Filed Vitals:   05/08/12 1345 05/08/12 1400 05/08/12 1415 05/08/12 1430  BP:  109/54    Pulse: 80 80 80   Temp:      TempSrc:      Resp: 8 9 13 14   Height:      Weight:      SpO2: 96% 97% 97%     Intake/Output Summary (Last 24 hours) at 05/08/12 1438 Last data filed at 05/08/12 1434  Gross per 24 hour  Intake 3709.9 ml  Output   3275 ml  Net  434.9 ml    LABS: Basic Metabolic Panel:  Basename 05/08/12 0435 05/07/12 1956 05/07/12 1930  NA 138 140 --  K 3.6 3.8 --  CL 104 105 --  CO2 27 -- --  GLUCOSE 126* 127* --  BUN 16 17 --  CREATININE 0.78 0.70 --  CALCIUM 7.9* -- --  MG 2.2 -- 3.3*  PHOS -- -- --   Liver Function Tests: No results found for this basename: AST:2,ALT:2,ALKPHOS:2,BILITOT:2,PROT:2,ALBUMIN:2 in the last 72 hours No results found for this basename: LIPASE:2,AMYLASE:2 in the last 72 hours CBC:  Basename 05/08/12 0435 05/07/12 1956 05/07/12 1930  WBC 14.9* -- 16.1*  NEUTROABS -- -- --  HGB 13.3 13.3 --  HCT 37.1* 39.0 --  MCV 83.9 -- 83.5  PLT 201 -- 213   Cardiac Enzymes: No results found for this basename: CKTOTAL:3,CKMB:3,CKMBINDEX:3,TROPONINI:3 in the last 72 hours BNP: No components found with this basename: POCBNP:3 D-Dimer: No results found for this basename: DDIMER:2 in the last 72 hours Hemoglobin A1C:  Basename 05/06/12 1939  HGBA1C 11.9*   Fasting Lipid Panel: No results found for this basename: CHOL,HDL,LDLCALC,TRIG,CHOLHDL,LDLDIRECT in the last 72 hours Thyroid Function Tests: No results found for this basename: TSH,T4TOTAL,FREET3,T3FREE,THYROIDAB in the last 72 hours Anemia Panel: No results found for this basename: VITAMINB12,FOLATE,FERRITIN,TIBC,IRON,RETICCTPCT in the last 72 hours   PHYSICAL EXAM General: Well developed, well nourished, in no acute  distress HEENT:  Normocephalic and atramatic Neck:  No JVD.  Lungs: Clear bilaterally to auscultation and percussion. Heart: HRRR . Normal S1 and S2 without gallops or murmurs.  Abdomen: Bowel sounds are positive, abdomen soft and non-tender  Msk:  Back normal, normal gait. Normal strength and tone for age. Extremities: No clubbing, cyanosis or edema.   Neuro: Alert and oriented X 3. Psych:  Good affect, responds appropriately  TELEMETRY: Reviewed telemetry pt in paced rhythm :  ASSESSMENT AND PLAN:  1. Crescendo angina with high risk treadmill stress test: cath showed 3 vessel CAD with high grade ostial LAD stenosis. He is 1 day post CABG. Doing well.   2. DM: continue sliding scale.   [name], MD, [facility] 05/08/2012 2:38 PM

## 2012-05-08 NOTE — Progress Notes (Signed)
Patient ID: Philip [name], male   DOB: [birth year], 67 y.o.   MRN: [id number] TCTS DAILY PROGRESS NOTE                   [address]            [place] [address]          [phone]      1 Day Post-Op Procedure(s) (LRB): CORONARY ARTERY BYPASS GRAFTING (CABG) (N/A) INTRAOPERATIVE TRANSESOPHAGEAL ECHOCARDIOGRAM (N/A)  Total Length of Stay:  LOS: 2 days   Subjective: Stable neuro intact this am  Objective: Vital signs in last 24 hours: Temp:  [96.8 F (36 C)-98.8 F (37.1 C)] 98.2 F (36.8 C) (01/08 0730) Pulse Rate:  [67-95] 79  (01/08 0730) Cardiac Rhythm:  [-] Atrial paced (01/08 0200) Resp:  [0-20] 13  (01/08 0730) BP: (90-118)/(50-67) 118/64 mmHg (01/08 0700) SpO2:  [92 %-99 %] 98 % (01/08 0730) Arterial Line BP: (86-132)/(48-77) 115/53 mmHg (01/08 0730) FiO2 (%):  [40 %-50 %] 40 % (01/07 1657) Weight:  [219 lb 5.7 oz (99.5 kg)-230 lb 3.2 oz (104.418 kg)] 230 lb 3.2 oz (104.418 kg) (01/08 0500)  Filed Weights   05/07/12 0500 05/07/12 1400 05/08/12 0500  Weight: 219 lb 6.4 oz (99.519 kg) 219 lb 5.7 oz (99.5 kg) 230 lb 3.2 oz (104.418 kg)    Weight change: 12.1 oz (0.344 kg)   Hemodynamic parameters for last 24 hours: PAP: (20-37)/(8-20) 23/10 mmHg CO:  [3.5 L/min-6.6 L/min] 6.4 L/min CI:  [1.6 L/min/m2-3.1 L/min/m2] 2.8 L/min/m2  Intake/Output from previous day: 01/07 0701 - 01/08 0700 In: 7978 [I.V.:6305; Blood:503; NG/GT:30; IV Piggyback:1140] Out: 4875 [Urine:3045; Blood:1400; Chest Tube:430]  Intake/Output this shift:    Current Meds: Scheduled Meds:   . acetaminophen  1,000 mg Oral Q6H   Or  . acetaminophen (TYLENOL) oral liquid 160 mg/5 mL  975 mg Per Tube Q6H  . aspirin EC  325 mg Oral Daily   Or  . aspirin  324 mg Per Tube Daily  . atorvastatin  20 mg Oral QPM  . bisacodyl  10 mg Oral Daily   Or  . bisacodyl  10 mg Rectal Daily  . docusate sodium  200 mg Oral Daily  . enoxaparin  30 mg Subcutaneous Q24H  . enoxaparin   30 mg Subcutaneous QHS  . famotidine (PEPCID) IV  20 mg Intravenous Q12H  . furosemide  40 mg Intravenous Once  . insulin aspart  0-24 Units Subcutaneous Q4H  . insulin detemir  23 Units Subcutaneous Q1200  . insulin regular  0-10 Units Intravenous TID WC  . levofloxacin (LEVAQUIN) IV  750 mg Intravenous Q24H  . levothyroxine  25 mcg Oral q morning - 10a  . metoprolol tartrate  12.5 mg Oral BID   Or  . metoprolol tartrate  12.5 mg Per Tube BID  . pantoprazole  40 mg Oral Daily  . potassium chloride  10 mEq Intravenous Q1 Hr x 3  . potassium chloride      . sodium chloride  3 mL Intravenous Q12H   Continuous Infusions:   . sodium chloride 20 mL/hr at 05/08/12 0700  . sodium chloride    . sodium chloride    . amiodarone (NEXTERONE PREMIX) 360 mg/200 mL dextrose 30.06 mg/hr (05/08/12 0700)  . dexmedetomidine Stopped (05/07/12 1805)  . insulin (NOVOLIN-R) infusion 4.5 Units/hr (05/08/12 0700)  . lactated ringers 20 mL/hr at 05/08/12 0700  . nitroGLYCERIN Stopped (05/07/12 1330)  .  phenylephrine (NEO-SYNEPHRINE) Adult infusion 25.067 mcg/min (05/08/12 0700)   PRN Meds:.albumin human, loratadine, metoprolol, midazolam, morphine injection, ondansetron (ZOFRAN) IV, oxyCODONE, sodium chloride  General appearance: alert and cooperative Neurologic: intact Heart: regular rate and rhythm, S1, S2 normal, no murmur, click, rub or gallop and normal apical impulse Lungs: clear to auscultation bilaterally Abdomen: soft, non-tender; bowel sounds normal; no masses,  no organomegaly Extremities: extremities normal, atraumatic, no cyanosis or edema Wound: sternum stable  Lab Results: CBC: Basename 05/08/12 0435 05/07/12 1956 05/07/12 1930  WBC 14.9* -- 16.1*  HGB 13.3 13.3 --  HCT 37.1* 39.0 --  PLT 201 -- 213   BMET:  Basename 05/08/12 0435 05/07/12 1956  NA 138 140  K 3.6 3.8  CL 104 105  CO2 27 --  GLUCOSE 126* 127*  BUN 16 17  CREATININE 0.78 0.70  CALCIUM 7.9* --    PT/INR:    Basename 05/07/12 1340  LABPROT 16.4*  INR 1.35   Radiology: Dg Chest 2 View  05/06/2012  *RADIOLOGY REPORT*  Clinical Data: Preoperative respiratory films.  Patient for CABG.  CHEST - 2 VIEW  Comparison: None.  Findings: Lungs are clear.  Heart size is normal.  No pneumothorax or pleural fluid.  IMPRESSION: No acute disease.   Original Report Authenticated By: Holley Dexter, M.D.    Dg Chest Portable 1 View In Am  05/08/2012  *RADIOLOGY REPORT*  Clinical Data: Coronary artery bypass  PORTABLE CHEST - 1 VIEW  Comparison: 05/07/2012  Findings: Endotracheal tube removed.  Esophageal transducer removed.  Left chest tube stable.  No apical pneumothorax. Trace pneumothorax along the left heart border suspected.  Swan-Ganz catheter stable.  Diffuse edema improved.  Minimal opacity at the left base likely related to volume loss and pleural effusion.  IMPRESSION: Extubated.  Trace left pneumothorax.  Overall, improved edema.  Residual opacity at the left base.   Original Report Authenticated By: Jolaine Click, M.D.    Dg Chest Port 1 View  05/07/2012  *RADIOLOGY REPORT*  Clinical Data: Incorrect scissor count, status post CABG  PORTABLE CHEST - 1 VIEW  Comparison: 05/06/2012  Findings: Widening of the mediastinum postoperatively.  Status post median sternotomy and CABG.  Endotracheal tube is seen with tip above the carina.  Swan-Ganz catheter is seen with tip into proximal right main pulmonary artery.  There is a left chest tube. No definite pneumothorax or edema on either side.  There is a mediastinal drain.  There are no pleural effusions. No retained surgical instruments.  IMPRESSION: Anticipated postoperative appearance with no retained surgical instruments.  I called this report to the OR at 1312.   Original Report Authenticated By: Esperanza Heir, M.D.      Assessment/Plan: S/P Procedure(s) (LRB): CORONARY ARTERY BYPASS GRAFTING (CABG) (N/A) INTRAOPERATIVE TRANSESOPHAGEAL ECHOCARDIOGRAM  (N/A) Mobilize Diuresis Diabetes control d/c tubes/lines Continue foley due to diuresing patient, strict I&O and patient critically ill See progression orders Continue insulin drip longer today add sq insulin    Traeson Dusza B 05/08/2012 8:05 AM

## 2012-05-08 NOTE — Progress Notes (Signed)
Inpatient Diabetes Program Recommendations  AACE/ADA: New Consensus Statement on Inpatient Glycemic Control (2013)  Target Ranges:  Prepandial:   less than 140 mg/dL      Peak postprandial:   less than 180 mg/dL (1-2 hours)      Critically ill patients:  140 - 180 mg/dL    Transitioning off IV insulin drip today.  Patient POD #1.  Per patient, was diagnosed with diabetes 2 days before admission for surgery (daignosed by PCP- Dr. Juanetta Gosling in [place], [place]- This was patient's 1st visit with this PCP).  Reviewed A1c results with this patient (11.9%- 05/06/12).  Explained what an A1c is and what it measures.  Also explained to patient that his goal A1c will be 7% or less per ADA standards to reduce the risk for chronic complications. Have asked patient to watch the DM videos on the patient education network.  Also ordered DM booklet for patient from pharmacy and an RD consult for DM diet education.  Will revisit patient again tomorrow.  Note: Will follow. Ambrose Finland RN, MSN, CDE Diabetes Coordinator Inpatient Diabetes Program [phone]

## 2012-05-08 NOTE — Progress Notes (Signed)
UR completed 

## 2012-05-08 NOTE — Op Note (Signed)
NAMEJAQUIL, TODT NO.:  [id number]  MEDICAL RECORD NO.:  [id number]  LOCATION:  2308                         FACILITY:  [facility]  PHYSICIAN:  Sheliah Plane, MD    DATE OF BIRTH:  February 08, 1946  DATE OF PROCEDURE:  05/07/2012 DATE OF DISCHARGE:                              OPERATIVE REPORT   PREOPERATIVE DIAGNOSES:  Coronary occlusive disease with new onset of angina, also a new diagnosis of hyperlipidemia, diabetes, and hypothyroidism.  POSTOPERATIVE DIAGNOSES:  Same  SURGICAL PROCEDURE:  Coronary artery bypass grafting x3 with left internal mammary to the left anterior descending coronary artery, reverse saphenous vein graft to the circumflex coronary artery and reverse saphenous vein graft to posterior descending coronary artery with left leg endo vein harvesting.  SURGEON:  Sheliah Plane, MD  FIRST ASSISTANT:  Doree Fudge, [name]  BRIEF HISTORY:  The patient is a 67 year old male who recently presented to his primary care physician, with complaints of neck soreness with exercise.  He was also noted to have new onset of diabetes. Preoperative hemoglobin A1c is 11.8, was also noted to have hyperlipidemia and hypothyroidism.  Because of his complaints of neck discomfort, he was referred to Cardiology and underwent a stress test and subsequent cardiac catheterization by Dr. Kirke Corin.  Catheterization revealed a high-grade proximal LAD stenosis of 90%, 70% circumflex lesion and 50% right coronary lesion.  Overall, ventricular function was mildly depressed with ejection fraction of approximately 50%.  The patient had a normal creatinine.  He was transferred to [facility] for consideration of coronary artery bypass grafting.  Risks and options were discussed with the patient who was agreeable and signed informed consent.  DESCRIPTION OF PROCEDURE:  With Swan-Ganz and arterial line monitors in place, the patient underwent endotracheal anesthesia  without incident. Skin of chest and legs were prepped with Betadine and draped in usual sterile manner.  A small incision was made at the right knee; however, there was no suitable vein located and what was found was very small, so we moved to the left leg.  Incision was made just below the knee and a suitable vein was identified and using the Guidant endovein harvesting system was harvested.  Median sternotomy was performed.  Left internal mammary artery was dissected down as a pedicle graft.  This artery was divided and had good free flow.  Pericardium was opened.  Overall ventricular function appeared to be preserved.  He was systemically heparinized.  The ascending aorta was cannulated.  The right atrium was cannulated and aortic root.  Cardioplegia needle was introduced into the ascending aorta.  The patient was placed on cardiopulmonary bypass 2.4 liters/minute/meter squared.  Sites of anastomosis were selected and dissected out the epicardium.  The patient's body temperature was cooled to 32 degrees.  Aortic crossclamp was applied, and 600 mL cold blood potassium cardioplegia was administered with diastolic arrest of the heart.  Myocardial septal temperature was monitored throughout the crossclamp period.  Attention was turned first to the distal circumflex, which was opened, was 1.5 cm vessel of reasonable quality distally.  A segment of reverse saphenous vein graft was anastomosed to the circumflex coronary artery.  Additional cold blood  cardioplegia was administered to the vein graft.  Attention was then turned to the posterior descending coronary artery.  The distal right and posterior descending coronary artery were diffusely diseased.  Vessel was opened, was somewhat thick walled.  One 1 mm probe was passed distally.  Using a running 7-0 Prolene, distal anastomosis was performed with second reverse saphenous vein.  Attention was then turned to the left anterior descending  coronary artery.  The LAD was partially intramyocardial.  It was dissected out.  It was relatively small and diffusely diseased with significant posterior wall plaque, using a running 8-0 Prolene, the left internal mammary artery was anastomosed to the left anterior descending coronary artery.  With release of the bulldog on the mammary artery, there was rise in myocardial septal temperature.  Bulldog was placed back on the mammary artery with crossclamp still in place.  Additional cold blood cardioplegia was administered.  Two punch aortotomies with the 4 drill holes were made in the ascending aorta and each of the two vein grafts were anastomosed to the descending aorta.  Air was evacuated from the ascending aorta and grafts, and aortic crossclamp was removed with total crossclamp time of 77 minutes.  Prior to removal of the crossclamp, the bulldog on the mammary artery was removed with prompt rise of myocardial septal temperature.  The patient spontaneously converted to a sinus rhythm, but developed a slow atrioventricular pacing wires were applied.  Before cannulation, the patient had developed atrial fibrillation.  For this reason, amiodarone had been started.  He was then ventilated and weaned from cardiopulmonary bypass without difficulty, remained hemodynamically stable, was decannulated in the usual fashion.  Protamine sulfate was administered for postoperative field hemostatic.  A left pleural tube, a Blake mediastinal drain left in place.  The sternum was then closed with #6 stainless steel wire. Fascia was closed with interrupted 0 Vicryl, running 3-0 Vicryl for the subcutaneous tissue, 4-0 subcuticular stitch in skin edges.  Total pump time was 112 minutes.  The patient did not require any blood bank blood products during the operative procedure.  He was transferred to Surgical Intensive Care Unit for further postoperative care.  Sponge and needle count was reported as correct  at the completion the procedure.  There was an incorrect instrument count with 1 extra scissors.  Per protocol, a chest x-ray was obtained in the operating room without any foreign bodies retained.     Sheliah Plane, MD     EG/MEDQ  D:  05/08/2012  T:  05/08/2012  Job:  [id number]  cc:   Dr. Kirke Corin

## 2012-05-09 ENCOUNTER — Inpatient Hospital Stay (HOSPITAL_COMMUNITY): Payer: Medicare Other

## 2012-05-09 DIAGNOSIS — IMO0001 Reserved for inherently not codable concepts without codable children: Secondary | ICD-10-CM

## 2012-05-09 DIAGNOSIS — E1165 Type 2 diabetes mellitus with hyperglycemia: Secondary | ICD-10-CM

## 2012-05-09 LAB — CBC
HCT: 39.4 % (ref 39.0–52.0)
Hemoglobin: 13.8 g/dL (ref 13.0–17.0)
MCH: 30.5 pg (ref 26.0–34.0)
MCHC: 35 g/dL (ref 30.0–36.0)
MCV: 87 fL (ref 78.0–100.0)
Platelets: 147 10*3/uL — ABNORMAL LOW (ref 150–400)
RBC: 4.53 MIL/uL (ref 4.22–5.81)
RDW: 12.8 % (ref 11.5–15.5)
WBC: 10.9 10*3/uL — ABNORMAL HIGH (ref 4.0–10.5)

## 2012-05-09 LAB — BASIC METABOLIC PANEL
BUN: 16 mg/dL (ref 6–23)
CO2: 29 mEq/L (ref 19–32)
Calcium: 8.5 mg/dL (ref 8.4–10.5)
Chloride: 102 mEq/L (ref 96–112)
Creatinine, Ser: 0.86 mg/dL (ref 0.50–1.35)
GFR calc Af Amer: >90 mL/min (ref >90)
GFR calc non Af Amer: 89 mL/min — ABNORMAL LOW (ref >90)
Glucose, Bld: 135 mg/dL — ABNORMAL HIGH (ref 70–99)
Potassium: 3.8 mEq/L (ref 3.5–5.1)
Sodium: 136 mEq/L (ref 135–145)

## 2012-05-09 LAB — GLUCOSE, CAPILLARY
Glucose-Capillary: 137 mg/dL — ABNORMAL HIGH (ref 70–99)
Glucose-Capillary: 154 mg/dL — ABNORMAL HIGH (ref 70–99)
Glucose-Capillary: 197 mg/dL — ABNORMAL HIGH (ref 70–99)
Glucose-Capillary: 207 mg/dL — ABNORMAL HIGH (ref 70–99)
Glucose-Capillary: 209 mg/dL — ABNORMAL HIGH (ref 70–99)

## 2012-05-09 MED ORDER — FUROSEMIDE 40 MG PO TABS
40.0000 mg | ORAL_TABLET | Freq: Every day | ORAL | Status: DC
  Administered 2012-05-09: 40 mg via ORAL
  Filled 2012-05-09 (×3): qty 1

## 2012-05-09 MED ORDER — INSULIN ASPART 100 UNIT/ML ~~LOC~~ SOLN
5.0000 [IU] | Freq: Three times a day (TID) | SUBCUTANEOUS | Status: DC
  Administered 2012-05-09 – 2012-05-11 (×6): 5 [IU] via SUBCUTANEOUS

## 2012-05-09 MED ORDER — INSULIN REGULAR HUMAN 100 UNIT/ML IJ SOLN
INTRAMUSCULAR | Status: DC
  Administered 2012-05-09: 3 [IU]/h via INTRAVENOUS
  Filled 2012-05-09 (×2): qty 1

## 2012-05-09 MED ORDER — AMIODARONE HCL 200 MG PO TABS
200.0000 mg | ORAL_TABLET | Freq: Two times a day (BID) | ORAL | Status: DC
  Administered 2012-05-09 – 2012-05-13 (×9): 200 mg via ORAL
  Filled 2012-05-09 (×13): qty 1

## 2012-05-09 MED ORDER — INSULIN ASPART 100 UNIT/ML ~~LOC~~ SOLN: 0.0000 [IU] | Freq: Three times a day (TID) | SUBCUTANEOUS | Status: DC

## 2012-05-09 MED FILL — Lidocaine HCl IV Inj 20 MG/ML: INTRAVENOUS | Qty: 5 | Status: AC

## 2012-05-09 MED FILL — Sodium Chloride IV Soln 0.9%: INTRAVENOUS | Qty: 1000 | Status: AC

## 2012-05-09 MED FILL — Heparin Sodium (Porcine) Inj 1000 Unit/ML: INTRAMUSCULAR | Qty: 10 | Status: AC

## 2012-05-09 MED FILL — Heparin Sodium (Porcine) Inj 1000 Unit/ML: INTRAMUSCULAR | Qty: 30 | Status: AC

## 2012-05-09 MED FILL — Sodium Bicarbonate IV Soln 8.4%: INTRAVENOUS | Qty: 50 | Status: AC

## 2012-05-09 MED FILL — Electrolyte-R (PH 7.4) Solution: INTRAVENOUS | Qty: 4000 | Status: AC

## 2012-05-09 MED FILL — Sodium Chloride Irrigation Soln 0.9%: Qty: 3000 | Status: AC

## 2012-05-09 MED FILL — Mannitol IV Soln 20%: INTRAVENOUS | Qty: 500 | Status: AC

## 2012-05-09 NOTE — Significant Event (Signed)
0845am-restarted insulin drip per protocol as pt latest CBG was 209. Insulin drip per glucostabilizer. MD Tyrone Sage made aware-verbal order to continue insulin drip until levemir dose at noon and then turn off insulin drip 2 hours afterward. Will monitor. Unnamed Hino, Charity fundraiser.

## 2012-05-09 NOTE — Plan of Care (Signed)
Problem: Food- and Nutrition-Related Knowledge Deficit (NB-1.1) Goal: Nutrition education Formal process to instruct or train a patient/client in a skill or to impart knowledge to help patients/clients voluntarily manage or modify food choices and eating behavior to maintain or improve health.  Outcome: Completed/Met Date Met:  05/09/12  RD consulted for nutrition education regarding diabetes.     Lab Results  Component Value Date    HGBA1C 11.9* 05/06/2012    RD provided "Carbohydrate Counting for People with Diabetes" handout from the Academy of Nutrition and Dietetics.  Discussed different food groups and their effects on blood sugar, emphasizing carbohydrate-containing foods. Provided list of carbohydrates and recommended serving sizes of common foods.  Emphasized importance of controlled and consistent carbohydrate intake throughout the day.  Encouraged diet/sugar-free beverage intake.  Expect good compliance.  Body mass index is 31.94 kg/(m^2). Pt meets criteria for Obesity Class I based on current BMI.  Current diet order is Clear Liquids.  Patient reports a good appetite; feels he will eat well once diet advanced.  Labs and medications reviewed.  No further nutrition interventions warranted at this time.  Please consult RD as needed.  Maureen Chatters, RD, LDN Pager #: [phone] After-Hours Pager #: [phone]

## 2012-05-09 NOTE — Progress Notes (Signed)
Inpatient Diabetes Program Recommendations  AACE/ADA: New Consensus Statement on Inpatient Glycemic Control (2013)  Target Ranges:  Prepandial:   less than 140 mg/dL      Peak postprandial:   less than 180 mg/dL (1-2 hours)      Critically ill patients:  140 - 180 mg/dL    Spoke with patient and his wife today more extensively about patient's new diagnosis of diabetes.  Reviewed Living Well with Diabetes booklet.  Explained to patient that he may need insulin at d/c.  Patient receptive to this.  Would prefer insulin pens.    Educated patient and spouse on insulin pen use at home.  Reviewed all steps if insulin pen including attachment of needle, 2-unit air shot, dialing up dose, giving injection, removing needle, disposal of sharps, storage of unused insulin, disposal of insulin etc.  Patient able to provide successful return demonstration.  Also reviewed troubleshooting with insulin pen.  MD will need to give patient Rxs for insulin pens and insulin pen needles.  MD- If you decide to send patient home on insulin, please write Rxs for insulin pens and insulin pen needles.  Patient can follow up with his PCP Dr. Juanetta Gosling in [place] after d/c.  Will follow. Ambrose Finland RN, MSN, CDE Diabetes Coordinator Inpatient Diabetes Program [phone]

## 2012-05-09 NOTE — Progress Notes (Addendum)
2 Days Post-Op Procedure(s) (LRB): CORONARY ARTERY BYPASS GRAFTING (CABG) (N/A) INTRAOPERATIVE TRANSESOPHAGEAL ECHOCARDIOGRAM (N/A) Subjective:  Mr. Clack has no complaints this morning.  He states that he has not ambulated yet.    Objective: Vital signs in last 24 hours: Temp:  [97.5 F (36.4 C)-98.8 F (37.1 C)] 97.8 F (36.6 C) (01/09 0756) Pulse Rate:  [59-97] 60  (01/09 0800) Cardiac Rhythm:  [-] Atrial paced (01/09 0400) Resp:  [8-22] 12  (01/09 0800) BP: (98-120)/(43-73) 102/53 mmHg (01/09 0800) SpO2:  [95 %-99 %] 98 % (01/09 0800) Arterial Line BP: (80-142)/(52-87) 80/71 mmHg (01/08 2300) Weight:  [229 lb (103.874 kg)] 229 lb (103.874 kg) (01/09 0500)  Hemodynamic parameters for last 24 hours: PAP: (20-32)/(12-21) 22/13 mmHg  Intake/Output from previous day: 01/08 0701 - 01/09 0700 In: 1866.4 [P.O.:180; I.V.:1428.4; IV Piggyback:258] Out: 2155 [Urine:2035; Chest Tube:120]  General appearance: alert, cooperative and no distress Heart: regular rate and rhythm Lungs: clear to auscultation bilaterally Abdomen: soft, non-tender; bowel sounds normal; no masses,  no organomegaly Extremities: edema trace Wound: clean and dry  Lab Results:  Basename 05/09/12 0500 05/08/12 1651 05/08/12 1650  WBC 10.9* -- 13.7*  HGB 13.8 12.6* --  HCT 39.4 37.0* --  PLT 147* -- 148*   BMET:  Basename 05/09/12 0500 05/08/12 1651 05/08/12 0435  NA 136 136 --  K 3.8 3.9 --  CL 102 101 --  CO2 29 -- 27  GLUCOSE 135* 148* --  BUN 16 17 --  CREATININE 0.86 0.90 --  CALCIUM 8.5 -- 7.9*    PT/INR:  Basename 05/07/12 1340  LABPROT 16.4*  INR 1.35   ABG    Component Value Date/Time   PHART 7.371 05/07/2012 1949   HCO3 25.4* 05/07/2012 1949   TCO2 25 05/08/2012 1651   ACIDBASEDEF 1.0 05/07/2012 1751   O2SAT 97.0 05/07/2012 1949   CBG (last 3)   Basename 05/09/12 0758 05/09/12 0441 05/08/12 2348  GLUCAP 209* 114* 137*    Assessment/Plan: S/P Procedure(s) (LRB): CORONARY ARTERY  BYPASS GRAFTING (CABG) (N/A) INTRAOPERATIVE TRANSESOPHAGEAL ECHOCARDIOGRAM (N/A)  1. CV- Previous Atrial Fibrillation, currently NSR- will d/c Amiodarone drip, start oral regimen continue Lopressor 2. Pulm- small left sided pleural effusion/atelectasis- wean oxygen as tolerated, continue IS 3. Renal- creatinine stable, volume overloaded, will start daily Lasix 4. DM- uncontrolled, restarted insulin drip this morning, will add mealtime coverage and wean drip as tolerated. Preop A1c 11.9, will likely need insulin at discharge 5. D/C Foley   LOS: 3 days    BARRETT, ERIN 05/09/2012  Still difficult to control DM back on insulin drip, continue levamir and add meal coverage I have seen and examined Juanetta Snow and agree with the above assessment  and plan.  Delight Ovens MD Beeper [phone] Office [phone] 05/09/2012 8:45 AM

## 2012-05-09 NOTE — Significant Event (Signed)
Pt just completed ambulation around the unit (300 feet) without any complications; denied dizziness, SOB, or nausea. HR in 78-85 during ambulation, 95% on 2L [place]. Pt returned to room. Will continue to monitor. Jurney Overacker, Charity fundraiser.

## 2012-05-10 DIAGNOSIS — E1165 Type 2 diabetes mellitus with hyperglycemia: Secondary | ICD-10-CM

## 2012-05-10 DIAGNOSIS — IMO0001 Reserved for inherently not codable concepts without codable children: Secondary | ICD-10-CM

## 2012-05-10 LAB — BASIC METABOLIC PANEL
BUN: 16 mg/dL (ref 6–23)
CO2: 31 mEq/L (ref 19–32)
Calcium: 8.2 mg/dL — ABNORMAL LOW (ref 8.4–10.5)
Chloride: 105 mEq/L (ref 96–112)
Creatinine, Ser: 0.85 mg/dL (ref 0.50–1.35)
GFR calc Af Amer: >90 mL/min (ref >90)
GFR calc non Af Amer: 89 mL/min — ABNORMAL LOW (ref >90)
Glucose, Bld: 93 mg/dL (ref 70–99)
Potassium: 3.2 mEq/L — ABNORMAL LOW (ref 3.5–5.1)
Sodium: 140 mEq/L (ref 135–145)

## 2012-05-10 LAB — GLUCOSE, CAPILLARY
Glucose-Capillary: 191 mg/dL — ABNORMAL HIGH (ref 70–99)
Glucose-Capillary: 108 mg/dL — ABNORMAL HIGH (ref 70–99)

## 2012-05-10 MED ORDER — INSULIN PEN STARTER KIT
1.0000 | Freq: Once | Status: DC
  Filled 2012-05-10: qty 1

## 2012-05-10 MED ORDER — BISACODYL 10 MG RE SUPP: 10.0000 mg | Freq: Every day | RECTAL | Status: DC | PRN

## 2012-05-10 MED ORDER — PANTOPRAZOLE SODIUM 40 MG PO TBEC
40.0000 mg | DELAYED_RELEASE_TABLET | Freq: Every day | ORAL | Status: DC
  Administered 2012-05-10 – 2012-05-13 (×4): 40 mg via ORAL
  Filled 2012-05-10 (×4): qty 1

## 2012-05-10 MED ORDER — POTASSIUM CHLORIDE CRYS ER 20 MEQ PO TBCR
20.0000 meq | EXTENDED_RELEASE_TABLET | Freq: Every day | ORAL | Status: DC
  Administered 2012-05-10 – 2012-05-12 (×3): 20 meq via ORAL
  Filled 2012-05-10 (×4): qty 1

## 2012-05-10 MED ORDER — DOCUSATE SODIUM 100 MG PO CAPS
200.0000 mg | ORAL_CAPSULE | Freq: Every day | ORAL | Status: DC
  Administered 2012-05-11 – 2012-05-12 (×2): 200 mg via ORAL
  Filled 2012-05-10 (×3): qty 2

## 2012-05-10 MED ORDER — MOVING RIGHT ALONG BOOK
Freq: Once | Status: AC
  Administered 2012-05-10: 19:00:00
  Filled 2012-05-10: qty 1

## 2012-05-10 MED ORDER — POTASSIUM CHLORIDE 10 MEQ/50ML IV SOLN
10.0000 meq | INTRAVENOUS | Status: DC
  Administered 2012-05-10 (×2): 10 meq via INTRAVENOUS
  Filled 2012-05-10: qty 150

## 2012-05-10 MED ORDER — ASPIRIN EC 325 MG PO TBEC
325.0000 mg | DELAYED_RELEASE_TABLET | Freq: Every day | ORAL | Status: DC
  Administered 2012-05-10 – 2012-05-13 (×4): 325 mg via ORAL
  Filled 2012-05-10 (×4): qty 1

## 2012-05-10 MED ORDER — FUROSEMIDE 40 MG PO TABS
40.0000 mg | ORAL_TABLET | Freq: Every day | ORAL | Status: DC
  Administered 2012-05-10 – 2012-05-12 (×3): 40 mg via ORAL
  Filled 2012-05-10 (×3): qty 1

## 2012-05-10 MED ORDER — INSULIN ASPART 100 UNIT/ML ~~LOC~~ SOLN
0.0000 [IU] | Freq: Three times a day (TID) | SUBCUTANEOUS | Status: DC
  Administered 2012-05-10: 4 [IU] via SUBCUTANEOUS
  Administered 2012-05-10 – 2012-05-11 (×2): 2 [IU] via SUBCUTANEOUS
  Administered 2012-05-11: 12 [IU] via SUBCUTANEOUS
  Administered 2012-05-12: 2 [IU] via SUBCUTANEOUS
  Administered 2012-05-12: 4 [IU] via SUBCUTANEOUS
  Administered 2012-05-12 – 2012-05-13 (×2): 2 [IU] via SUBCUTANEOUS

## 2012-05-10 MED ORDER — POTASSIUM CHLORIDE 10 MEQ/50ML IV SOLN
10.0000 meq | Freq: Once | INTRAVENOUS | Status: AC
  Administered 2012-05-10: 10 meq via INTRAVENOUS

## 2012-05-10 MED ORDER — METOPROLOL TARTRATE 12.5 MG HALF TABLET
12.5000 mg | ORAL_TABLET | Freq: Two times a day (BID) | ORAL | Status: DC
  Administered 2012-05-10 – 2012-05-11 (×3): 12.5 mg via ORAL
  Filled 2012-05-10 (×4): qty 1

## 2012-05-10 MED ORDER — SODIUM CHLORIDE 0.9 % IV SOLN: 250.0000 mL | INTRAVENOUS | Status: DC | PRN

## 2012-05-10 MED ORDER — BISACODYL 5 MG PO TBEC
10.0000 mg | DELAYED_RELEASE_TABLET | Freq: Every day | ORAL | Status: DC | PRN
  Administered 2012-05-11 – 2012-05-12 (×2): 10 mg via ORAL
  Filled 2012-05-10 (×3): qty 2

## 2012-05-10 MED ORDER — OXYCODONE HCL 5 MG PO TABS
5.0000 mg | ORAL_TABLET | ORAL | Status: DC | PRN
Start: 2012-05-10 — End: 2012-05-13
  Administered 2012-05-10 – 2012-05-11 (×5): 10 mg via ORAL
  Filled 2012-05-10 (×6): qty 2

## 2012-05-10 MED ORDER — SODIUM CHLORIDE 0.9 % IJ SOLN: 3.0000 mL | INTRAMUSCULAR | Status: DC | PRN

## 2012-05-10 MED ORDER — SODIUM CHLORIDE 0.9 % IJ SOLN
3.0000 mL | Freq: Two times a day (BID) | INTRAMUSCULAR | Status: DC
  Administered 2012-05-10 – 2012-05-12 (×6): 3 mL via INTRAVENOUS

## 2012-05-10 MED ORDER — TRAMADOL HCL 50 MG PO TABS
50.0000 mg | ORAL_TABLET | ORAL | Status: DC | PRN
Start: 2012-05-10 — End: 2012-05-13

## 2012-05-10 MED ORDER — METFORMIN HCL 500 MG PO TABS
500.0000 mg | ORAL_TABLET | Freq: Two times a day (BID) | ORAL | Status: DC
  Administered 2012-05-10 – 2012-05-13 (×7): 500 mg via ORAL
  Filled 2012-05-10 (×10): qty 1

## 2012-05-10 NOTE — Progress Notes (Signed)
Patient ID: Philip [name], male   DOB: [birth year], 67 y.o.   MRN: [id number] TCTS DAILY PROGRESS NOTE                   [address]            [place] [address]          [phone]      3 Days Post-Op Procedure(s) (LRB): CORONARY ARTERY BYPASS GRAFTING (CABG) (N/A) INTRAOPERATIVE TRANSESOPHAGEAL ECHOCARDIOGRAM (N/A)  Total Length of Stay:  LOS: 4 days   Subjective: Feels well this am, up to chair, has ambulated Understands is now getting insulin, DM education is on going  Objective: Vital signs in last 24 hours: Temp:  [97.8 F (36.6 C)-99.3 F (37.4 C)] 99.1 F (37.3 C) (01/10 0414) Pulse Rate:  [54-77] 66  (01/10 0700) Cardiac Rhythm:  [-] Normal sinus rhythm (01/10 0400) Resp:  [12-22] 17  (01/10 0700) BP: (94-126)/(42-92) 126/58 mmHg (01/10 0700) SpO2:  [93 %-98 %] 95 % (01/10 0700) Weight:  [226 lb 13.7 oz (102.9 kg)] 226 lb 13.7 oz (102.9 kg) (01/10 0500)  Filed Weights   05/08/12 0500 05/09/12 0500 05/10/12 0500  Weight: 230 lb 3.2 oz (104.418 kg) 229 lb (103.874 kg) 226 lb 13.7 oz (102.9 kg)    Weight change: -2 lb 2.4 oz (-0.974 kg)   Hemodynamic parameters for last 24 hours:    Intake/Output from previous day: 01/09 0701 - 01/10 0700 In: 1375.8 [P.O.:720; I.V.:605.8; IV Piggyback:50] Out: 1325 [Urine:1325]  Intake/Output this shift:    Current Meds: Scheduled Meds:   . acetaminophen  1,000 mg Oral Q6H   Or  . acetaminophen (TYLENOL) oral liquid 160 mg/5 mL  975 mg Per Tube Q6H  . amiodarone  200 mg Oral BID  . aspirin EC  325 mg Oral Daily   Or  . aspirin  324 mg Per Tube Daily  . atorvastatin  20 mg Oral QPM  . bisacodyl  10 mg Oral Daily   Or  . bisacodyl  10 mg Rectal Daily  . docusate sodium  200 mg Oral Daily  . enoxaparin  30 mg Subcutaneous QHS  . furosemide  40 mg Oral Daily  . insulin aspart  0-24 Units Subcutaneous TID AC & HS  . insulin aspart  5 Units Subcutaneous TID WC  . insulin detemir  23 Units  Subcutaneous Q1200  . insulin regular  0-10 Units Intravenous TID WC  . levothyroxine  25 mcg Oral q morning - 10a  . metoprolol tartrate  12.5 mg Oral BID   Or  . metoprolol tartrate  12.5 mg Per Tube BID  . pantoprazole  40 mg Oral Daily  . potassium chloride  10 mEq Intravenous Q1 Hr x 3  . sodium chloride  3 mL Intravenous Q12H   Continuous Infusions:   . sodium chloride Stopped (05/10/12 0600)  . sodium chloride    . insulin (NOVOLIN-R) infusion Stopped (05/09/12 1340)  . lactated ringers Stopped (05/09/12 1340)   PRN Meds:.loratadine, meclizine, metoprolol, midazolam, morphine injection, ondansetron (ZOFRAN) IV, oxyCODONE, sodium chloride  General appearance: alert and cooperative Neurologic: intact Heart: regular rate and rhythm, S1, S2 normal, no murmur, click, rub or gallop and normal apical impulse Lungs: clear to auscultation bilaterally and normal percussion bilaterally Abdomen: soft, non-tender; bowel sounds normal; no masses,  no organomegaly Extremities: extremities normal, atraumatic, no cyanosis or edema and Homans sign is negative, no sign of DVT Wound: sternum  stable  Lab Results: CBC: Basename 05/09/12 0500 05/08/12 1651 05/08/12 1650  WBC 10.9* -- 13.7*  HGB 13.8 12.6* --  HCT 39.4 37.0* --  PLT 147* -- 148*   BMET:  Basename 05/10/12 0400 05/09/12 0500  NA 140 136  K 3.2* 3.8  CL 105 102  CO2 31 29  GLUCOSE 93 135*  BUN 16 16  CREATININE 0.85 0.86  CALCIUM 8.2* 8.5    PT/INR:  Basename 05/07/12 1340  LABPROT 16.4*  INR 1.35   Radiology: Dg Chest Portable 1 View In Am  05/09/2012  *RADIOLOGY REPORT*  Clinical Data: Coronary bypass, postoperative exam  PORTABLE CHEST - 1 VIEW  Comparison: 05/08/2012  Findings: Swan-Ganz catheter and left chest tube removed.  Right IJ vascular sheath remains, stable and position.  Median sternotomy changes noted for coronary bypass.  Stable cardiomegaly without CHF.  Residual but improving left base atelectasis  and trace left effusion noted.  No pneumothorax.  IMPRESSION: Improving left base atelectasis and effusion.  Left chest tube removed.  No pneumothorax.   Original Report Authenticated By: Judie Petit. Miles Costain, M.D.      Assessment/Plan: S/P Procedure(s) (LRB): CORONARY ARTERY BYPASS GRAFTING (CABG) (N/A) INTRAOPERATIVE TRANSESOPHAGEAL ECHOCARDIOGRAM (N/A) Mobilize Diuresis Diabetes control Plan for transfer to step-down: see transfer orders Now off insulin drip, dm education has started   Latresa Gasser B 05/10/2012 7:14 AM

## 2012-05-10 NOTE — Plan of Care (Signed)
Problem: Phase III Progression Outcomes Goal: Transfer to PCTU/Telemetry POD Outcome: Completed/Met Date Met:  05/10/12 Transferred to 2016.  Ambulated to room without difficulty behind wheelchair.  Tolerated well.  Family at bedside with patient.

## 2012-05-10 NOTE — Plan of Care (Signed)
Problem: Phase III Progression Outcomes Goal: Discharge plan remains appropriate-arrangements made Outcome: Completed/Met Date Met:  05/10/12 Home with wife

## 2012-05-10 NOTE — Evaluation (Addendum)
Physical Therapy Evaluation and D/C Patient Details Name: Philip [name] MRN: [id number] DOB: Jan 05, 1946 Today's Date: 05/10/2012 Time: 4098-1191 PT Time Calculation (min): 25 min  PT Assessment / Plan / Recommendation Clinical Impression  Pt s/p CABG with very little decline in function after surgery per patient.  Patient states he is very close to his baseline.  Has no balance deficits.  Will not follow in hospital as pt is independent with mobility.      PT Assessment  Patent does not need any further PT services    Follow Up Recommendations  No PT follow up                Equipment Recommendations  None recommended by PT               Precautions / Restrictions Precautions Precautions: Sternal Precaution Comments: Gave sternal precaution handout and reviewed with patient Restrictions Weight Bearing Restrictions: No   Pertinent Vitals/Pain VSS, No pain      Mobility  Bed Mobility Bed Mobility: Not assessed Transfers Transfers: Sit to Stand;Stand to Sit Sit to Stand: 7: Independent Stand to Sit: 7: Independent Details for Transfer Assistance: Pt adheres to sternal precautions without cues.  Used pillow against sternum to stand up using leg strength.  Pt steady.     Ambulation/Gait Ambulation/Gait Assistance: 7: Independent Ambulation Distance (Feet): 450 Feet Assistive device: None Ambulation/Gait Assistance Details: Pt ambulates without assistance.  Can accept challenges.  No LOB.   Gait Pattern: Step-through pattern Stairs: Yes Stairs Assistance: 7: Independent Stair Management Technique: No rails;Alternating pattern;Forwards Number of Stairs: 2  Wheelchair Mobility Wheelchair Mobility: No    PT Goals  N/A  Visit Information  Last PT Received On: 05/10/12 Assistance Needed: +1    Subjective Data  Subjective: "I feel like I am doing well." Patient Stated Goal: To go home   Prior Functioning  Home Living Lives With: Spouse Available Help at  Discharge: Family;Available 24 hours/day Type of Home: House Home Access: Stairs to enter [employer] of Steps: 1 Entrance Stairs-Rails: None Home Layout: One level Bathroom Shower/Tub: Forensic scientist: Handicapped height Home Adaptive Equipment: None Prior Function Level of Independence: Independent Able to Take Stairs?: Yes Driving: Yes Vocation: Retired Musician: No difficulties Dominant Hand: Right    Cognition  Overall Cognitive Status: Appears within functional limits for tasks assessed/performed Arousal/Alertness: Awake/alert Orientation Level: Appears intact for tasks assessed Behavior During Session: [facility] for tasks performed    Extremity/Trunk Assessment Right Lower Extremity Assessment RLE ROM/Strength/Tone: [facility] for tasks assessed Left Lower Extremity Assessment LLE ROM/Strength/Tone: [facility] for tasks assessed Trunk Assessment Trunk Assessment: Normal   Balance Standardized Balance Assessment Standardized Balance Assessment: Dynamic Gait Index Dynamic Gait Index Level Surface: Normal Change in Gait Speed: Normal Gait with Horizontal Head Turns: Normal Gait with Vertical Head Turns: Normal Gait and Pivot Turn: Normal Step Over Obstacle: Mild Impairment Step Around Obstacles: Normal Steps: Normal Total Score: 23  High Level Balance High Level Balance Comments: Scored 23/24 on DGI suggesting low fall risk  End of Session PT - End of Session Equipment Utilized During Treatment: Gait belt Activity Tolerance: Patient tolerated treatment well Patient left: in chair;with call bell/phone within reach;with family/visitor present Nurse Communication: Mobility status       INGOLD,Osei Anger 05/10/2012, 3:38 PM  [facility] [phone] (pager)

## 2012-05-11 ENCOUNTER — Inpatient Hospital Stay (HOSPITAL_COMMUNITY): Payer: Medicare Other

## 2012-05-11 LAB — BASIC METABOLIC PANEL
BUN: 17 mg/dL (ref 6–23)
CO2: 25 mEq/L (ref 19–32)
Calcium: 8.6 mg/dL (ref 8.4–10.5)
Chloride: 101 mEq/L (ref 96–112)
Creatinine, Ser: 0.84 mg/dL (ref 0.50–1.35)
GFR calc Af Amer: >90 mL/min (ref >90)
GFR calc non Af Amer: 90 mL/min — ABNORMAL LOW (ref >90)
Glucose, Bld: 109 mg/dL — ABNORMAL HIGH (ref 70–99)
Potassium: 3.6 mEq/L (ref 3.5–5.1)
Sodium: 136 mEq/L (ref 135–145)

## 2012-05-11 LAB — CBC
HCT: 37.6 % — ABNORMAL LOW (ref 39.0–52.0)
Hemoglobin: 12.8 g/dL — ABNORMAL LOW (ref 13.0–17.0)
MCH: 29.4 pg (ref 26.0–34.0)
MCHC: 34 g/dL (ref 30.0–36.0)
MCV: 86.4 fL (ref 78.0–100.0)
Platelets: 172 10*3/uL (ref 150–400)
RBC: 4.35 MIL/uL (ref 4.22–5.81)
RDW: 12.4 % (ref 11.5–15.5)
WBC: 9.2 10*3/uL (ref 4.0–10.5)

## 2012-05-11 LAB — GLUCOSE, CAPILLARY
Glucose-Capillary: 102 mg/dL — ABNORMAL HIGH (ref 70–99)
Glucose-Capillary: 252 mg/dL — ABNORMAL HIGH (ref 70–99)

## 2012-05-11 MED ORDER — METOPROLOL TARTRATE 25 MG PO TABS
25.0000 mg | ORAL_TABLET | Freq: Two times a day (BID) | ORAL | Status: DC
  Administered 2012-05-11 – 2012-05-13 (×4): 25 mg via ORAL
  Filled 2012-05-11 (×5): qty 1

## 2012-05-11 NOTE — Progress Notes (Addendum)
4 Days Post-Op Procedure(s) (LRB): CORONARY ARTERY BYPASS GRAFTING (CABG) (N/A) INTRAOPERATIVE TRANSESOPHAGEAL ECHOCARDIOGRAM (N/A) Subjective:  Philip [name] has been having episodes of Atrial Fibrillation overnight.  He has no other complaints.  He is ambulating, +BM  Objective: Vital signs in last 24 hours: Temp:  [98.4 F (36.9 C)-99 F (37.2 C)] 99 F (37.2 C) (01/11 0511) Pulse Rate:  [65-113] 113  (01/11 0800) Cardiac Rhythm:  [-] Normal sinus rhythm (01/10 2010) Resp:  [11-18] 18  (01/11 0511) BP: (127-155)/(52-81) 142/81 mmHg (01/11 0800) SpO2:  [93 %-99 %] 93 % (01/11 0511) Weight:  [222 lb 3.6 oz (100.8 kg)] 222 lb 3.6 oz (100.8 kg) (01/11 0700)  Intake/Output from previous day: 01/10 0701 - 01/11 0700 In: 843 [P.O.:840; I.V.:3] Out: 2650 [Urine:2650]  General appearance: alert, cooperative and no distress Heart: regular rate and rhythm Lungs: clear to auscultation bilaterally Abdomen: soft, non-tender; bowel sounds normal; no masses,  no organomegaly Extremities: edema trace Wound: clean and dry  Lab Results:  Basename 05/11/12 0516 05/09/12 0500  WBC 9.2 10.9*  HGB 12.8* 13.8  HCT 37.6* 39.4  PLT 172 147*   BMET:  Basename 05/11/12 0516 05/10/12 0400  NA 136 140  K 3.6 3.2*  CL 101 105  CO2 25 31  GLUCOSE 109* 93  BUN 17 16  CREATININE 0.84 0.85  CALCIUM 8.6 8.2*    PT/INR: No results found for this basename: LABPROT,INR in the last 72 hours ABG    Component Value Date/Time   PHART 7.371 05/07/2012 1949   HCO3 25.4* 05/07/2012 1949   TCO2 25 05/08/2012 1651   ACIDBASEDEF 1.0 05/07/2012 1751   O2SAT 97.0 05/07/2012 1949   CBG (last 3)   Basename 05/11/12 0646 05/10/12 2018 05/10/12 1624  GLUCAP 102* 108* 191*    Assessment/Plan: S/P Procedure(s) (LRB): CORONARY ARTERY BYPASS GRAFTING (CABG) (N/A) INTRAOPERATIVE TRANSESOPHAGEAL ECHOCARDIOGRAM (N/A)  1. CV- Episodes of Atrial Fibrillation overnight, currently NSR- will increase Lopressor to 25mg  BID,  continue Amiodarone at 200mg  BID, may benefit from Amiodarone bolus or Coumadin if continues to have episodes of A. Fib 2. Pulm- no acute issues, continue IS 3. Volume Overload- weight is up from surgery, creatinine stable, continue diuresis for now 4. DM- new diagnosis, CBGs pretty well controlled with insulin and Metformin 5. Dispo- patient doing well, will need insulin at discharge, need to make sure A. Fib resolved prior to d/c    LOS: 5 days    BARRETT, ERIN 05/11/2012   I have seen and examined the patient and agree with the assessment and plan as outlined.  So far maintaining NSR all day today.  Possibly ready for d/c home 2-3 days.  Will consider coumadin if AFib recurs.  Philip [name] H 05/11/2012 1:11 PM

## 2012-05-11 NOTE — Progress Notes (Signed)
Pt ambulated in hallway 500 ft with son. Pt tolerated activity well. Will continue to monitor.

## 2012-05-11 NOTE — Progress Notes (Signed)
[id number] Pt ambulated previously, feeling nauseous, plans to walk again this evening. Reviewed CABG d/c education with pt and pt's family including sternal precautions, IS use, restrictions, risk factor modification, and activity progression. Diabetic and heart healthy diet sheet given. Pt voices understanding of instructions given. Per pt's family, he has already been referred to Phase 2 Cardiac Rehab at [facility].   Peggye Pitt, Forrestine Him

## 2012-05-12 DIAGNOSIS — Z951 Presence of aortocoronary bypass graft: Secondary | ICD-10-CM

## 2012-05-12 LAB — GLUCOSE, CAPILLARY
Glucose-Capillary: 136 mg/dL — ABNORMAL HIGH (ref 70–99)
Glucose-Capillary: 168 mg/dL — ABNORMAL HIGH (ref 70–99)

## 2012-05-12 MED ORDER — OXYCODONE HCL 5 MG PO TABS: 5.0000 mg | ORAL_TABLET | ORAL | Status: DC | PRN

## 2012-05-12 MED ORDER — LANCETS ULTRA THIN MISC: 1.0000 | Freq: Three times a day (TID) | Status: DC

## 2012-05-12 MED ORDER — METOPROLOL TARTRATE 25 MG PO TABS: 25.0000 mg | ORAL_TABLET | Freq: Two times a day (BID) | ORAL | Status: DC

## 2012-05-12 MED ORDER — INSULIN DETEMIR 100 UNIT/ML ~~LOC~~ SOLN: 23.0000 [IU] | Freq: Every day | SUBCUTANEOUS | Status: DC

## 2012-05-12 MED ORDER — FUROSEMIDE 40 MG PO TABS: 40.0000 mg | ORAL_TABLET | Freq: Every day | ORAL | Status: DC

## 2012-05-12 MED ORDER — INSULIN ASPART 100 UNIT/ML ~~LOC~~ SOLN: 5.0000 [IU] | Freq: Three times a day (TID) | SUBCUTANEOUS | Status: DC

## 2012-05-12 MED ORDER — INSULIN PEN NEEDLE 30G X 8 MM MISC: 1.0000 | Status: DC | PRN

## 2012-05-12 MED ORDER — FREESTYLE SYSTEM KIT: 1.0000 | PACK | Status: DC | PRN

## 2012-05-12 MED ORDER — POTASSIUM CHLORIDE CRYS ER 20 MEQ PO TBCR: 20.0000 meq | EXTENDED_RELEASE_TABLET | Freq: Every day | ORAL | Status: DC

## 2012-05-12 MED ORDER — AMIODARONE HCL 200 MG PO TABS: 200.0000 mg | ORAL_TABLET | Freq: Two times a day (BID) | ORAL | Status: DC

## 2012-05-12 MED ORDER — GLUCOSE BLOOD VI STRP: ORAL_STRIP | Status: DC

## 2012-05-12 NOTE — Progress Notes (Signed)
EPW's and CT sutures DC'd per order and unit protocol.  Pt tolerated very well, all tips intact.  Sites painted and steri's applied.  VSS, see flowsheet.  Pt understands bedrest for one hour.  Family at side, call bell in reach.  MT aware, will monitor closely.

## 2012-05-12 NOTE — Progress Notes (Signed)
Pt has ambulated halls with family twice on this shift.

## 2012-05-12 NOTE — Progress Notes (Addendum)
5 Days Post-Op Procedure(s) (LRB): CORONARY ARTERY BYPASS GRAFTING (CABG) (N/A) INTRAOPERATIVE TRANSESOPHAGEAL ECHOCARDIOGRAM (N/A) Subjective:  [name] has no complaints this morning.  He has not had any further episodes of Atrial Fibrillation  Objective: Vital signs in last 24 hours: Temp:  [98.1 F (36.7 C)-98.3 F (36.8 C)] 98.1 F (36.7 C) (01/12 0550) Pulse Rate:  [72-83] 72  (01/12 0550) Cardiac Rhythm:  [-] Normal sinus rhythm (01/12 0740) Resp:  [18] 18  (01/12 0550) BP: (124-148)/(61-63) 124/63 mmHg (01/12 0550) SpO2:  [97 %-98 %] 97 % (01/12 0550) Weight:  [222 lb (100.699 kg)] 222 lb (100.699 kg) (01/12 0550)  Intake/Output from previous day: 01/11 0701 - 01/12 0700 In: 360 [P.O.:360] Out: -   General appearance: alert, cooperative and no distress Heart: regular rate and rhythm Lungs: clear to auscultation bilaterally Abdomen: soft, non-tender; bowel sounds normal; no masses,  no organomegaly Extremities: edema trace Wound: clean and dry  Lab Results:  [facility] 05/11/12 0516  WBC 9.2  HGB 12.8*  HCT 37.6*  PLT 172   BMET:  Basename 05/11/12 0516 05/10/12 0400  NA 136 140  K 3.6 3.2*  CL 101 105  CO2 25 31  GLUCOSE 109* 93  BUN 17 16  CREATININE 0.84 0.85  CALCIUM 8.6 8.2*    PT/INR: No results found for this basename: LABPROT,INR in the last 72 hours ABG    Component Value Date/Time   PHART 7.371 05/07/2012 1949   HCO3 25.4* 05/07/2012 1949   TCO2 25 05/08/2012 1651   ACIDBASEDEF 1.0 05/07/2012 1751   O2SAT 97.0 05/07/2012 1949   CBG (last 3)   Basename 05/12/12 0549 05/11/12 2055 05/11/12 1641  GLUCAP 140* 119* 252*    Assessment/Plan: S/P Procedure(s) (LRB): CORONARY ARTERY BYPASS GRAFTING (CABG) (N/A) INTRAOPERATIVE TRANSESOPHAGEAL ECHOCARDIOGRAM (N/A)  1. CV- Previous A. Fib, NSR currently on Amiodarone, Lopressor 2. Pulm- no acute issues 3. Volume overload continue diuresis 4. DM- new diagnosis- will d/c patient with current insulin  regimen 5. Dispo- patient doing well, if no further A.Fib will d/c home in AM   LOS: 6 days    BARRETT, ERIN 05/12/2012   I have seen and examined the patient and agree with the assessment and plan as outlined.  Quentina Fronek H 05/12/2012 1:32 PM

## 2012-05-12 NOTE — Discharge Summary (Addendum)
Physician Discharge Summary  Patient ID: Philip [name] MRN: [id number] DOB/AGE: 1945-06-13 67 y.o.  Admit date: 05/06/2012 Discharge date: 05/12/2012  Admission Diagnoses:  Patient Active Problem List  Diagnosis  . Coronary atherosclerosis of native coronary artery  . Hypothyroid  . Diabetes mellitus   Discharge Diagnoses:   Patient Active Problem List  Diagnosis  . Coronary atherosclerosis of native coronary artery  . Hypothyroid  . Diabetes mellitus  . S/P CABG x 3   Discharged Condition: good  History of Present Illness:   Mr. No is a 67 yo white male who was referred to Cardiology for further evaluation by Dr. Juanetta Gosling.  The patient presented with a complaint of exertional neck and throat tightness with associated dyspnea.  These symptoms have been present for a few months but have been getting progressively worse.  He underwent a treadmill stress test which was abnormal and high suggestive of ischemia.  Due to those findings the patient was taken to the catheterization lab by Dr. Kirke Corin and was found to have high grade LAD stenosis requiring PCI and a reduced EF.  Due to this the patient the patient was transferred to [facility] for possible coronary bypass.  Hospital Course:   Upon arrival the patient was placed on a Heparin drip.  TCTS was consulted and the patient was evaluated by Dr. Tyrone Sage who agreed Coronary bypass would be the patient's best treatment option.  The risks and benefits of the procedure were explained to the patient and he was agreeable to proceed.  The patient was taken to the operating room on 05/07/2012 and underwent CABG x3 utilizing LIMA to LAD, RSVG to Left Circumflex artery, and RSVG to PDA.  He also underwent Endoscopic Saphenous vein harvest of the left leg.  The patient tolerated the procedure well and was taken to the SICU in stable condition.  The patient was extubated the evening of surgery.  During his stay in the ICU the patients chest  tubes and arterial lines were removed without difficulty.  He is a newly diagnosed diabetic with an A1c of 11.9% and he was started on an insulin regimen.  He developed Atrial Fibrillation and was treated with Amiodarone with conversion to NSR.  Once patient was medically stable he was transferred to the Telemetry unit for further care.  The patient has done well.  His pacing wires have been removed.  The patient has had some brief episodes of Atrial Fibrillation, but has been maintaining NSR for the past 48 hours.  Should he develop any further atrial fibrillation we will start him on Coumadin.  As previously mentioned patient is a new diabetic.  He was placed on low dose Metformin by his PCP.  I do not feel this will be adequate to control his blood sugars.  His sugars are well controlled currently with use of Metformin and insulin.  The patient has received diabetes education and we will discharge him on his current insulin regimen.  The patient is doing well and I anticipate discharge in the morning pending no more episodes of Atrial Fibrillation.  He will need to follow up with Dr. Tyrone Sage in 3 weeks with a chest xray prior to his appointment.  He will also need to set up a 2-4 week follow up with Dr. Kirke Corin.  Finally he will need to follow up with his PCP on a regular basis for good diabetes management.         Consults: cardiology and Diabetes Education  Treatments: surgery:   Coronary artery bypass grafting x3 with left  internal mammary to the left anterior descending coronary artery,  reverse saphenous vein graft to the circumflex coronary artery and  reverse saphenous vein graft to posterior descending coronary artery  with left leg endo vein harvesting.  Disposition: Home  The patient has been discharged on:   1.Beta Blocker:  Yes [  x ]                              No   [   ]                              If No, reason:  2.Ace Inhibitor/ARB: Yes [   ]                                      No  [ x   ]                                     If No, reason: Labile blood pressure, can hopefully start on outpatient basis  3.Statin:   Yes [  x ]                  No  [   ]                  If No, reason:  4.Ecasa:  Yes  [x   ]                  No   [   ]                  If No, reason:     Discharge Orders    Future Orders Please Complete By Expires   Ambulatory referral to Nutrition and Diabetic Education      Comments:   Patient with new onset DM.  A1c 11.9% (05/06/12).  PCP is Dr. Juanetta Gosling in [place].    Patient: Please call the [facility] after discharge to schedule an appointment for diabetes education if you do not hear from the center before discharge  [phone]       Medication List    Medication List     As of 05/13/2012  8:12 AM    TAKE these medications         amiodarone 200 MG tablet   Commonly known as: PACERONE   Take 1 tablet (200 mg total) by mouth 2 (two) times daily.      aspirin 325 MG EC tablet   Take 1 tablet (325 mg total) by mouth daily.      atorvastatin 10 MG tablet   Commonly known as: LIPITOR   Take 10 mg by mouth every evening.      furosemide 40 MG tablet   Commonly known as: LASIX   Take 1 tablet (40 mg total) by mouth daily. For 5 Days      glucose blood test strip   Use as instructed      glucose monitoring kit monitoring kit   1 each by Does not apply route as needed for other.  insulin aspart 100 UNIT/ML injection   Commonly known as: novoLOG   Inject 5 Units into the skin 3 (three) times daily before meals.      insulin detemir 100 UNIT/ML injection   Commonly known as: LEVEMIR   Inject 23 Units into the skin at bedtime.      Insulin Pen Needle 30G X 8 MM Misc   Commonly known as: NOVOFINE   Inject 10 each into the skin as needed.      LANCETS ULTRA THIN Misc   1 Device by Does not apply route 4 (four) times daily -  before meals and at bedtime.       levothyroxine 25 MCG tablet   Commonly known as: SYNTHROID, LEVOTHROID   Take 25 mcg by mouth every morning.      loratadine 10 MG tablet   Commonly known as: CLARITIN   Take 10 mg by mouth daily as needed. For allergies      metFORMIN 500 MG tablet   Commonly known as: GLUCOPHAGE   Take 500 mg by mouth 2 (two) times daily with a meal.      metoprolol tartrate 25 MG tablet   Commonly known as: LOPRESSOR   Take 1 tablet (25 mg total) by mouth 2 (two) times daily.      oxyCODONE 5 MG immediate release tablet   Commonly known as: Oxy IR/ROXICODONE   Take 1-2 tablets (5-10 mg total) by mouth every 3 (three) hours as needed.      potassium chloride SA 20 MEQ tablet   Commonly known as: K-DUR,KLOR-CON   Take 1 tablet (20 mEq total) by mouth daily. For 5 Days               Follow-up Information    Follow up with GERHARDT,EDWARD B, MD. In 3 weeks. Environmental manager office for appointment)    Contact information:   [address] [employer] [address] [place] [address] [phone]       Follow up with [facility]. In 3 weeks. (Please get chest xray 1 hour prior to appointment)    Contact information:   [place]       Schedule an appointment as soon as possible for a visit with [name], MD. (Please contact office to set up 2-4 week follow up visit)    Contact information:   [address] [place] [address] [phone]    Follow up with Park Pope, MD. (Call for a follow up appointment regarding HGA1C 11.9)     Contact information:   [address] [place] [address]  [phone]             Signed: Lowella Dandy 05/12/2012, 12:42 PM

## 2012-05-13 ENCOUNTER — Encounter: Payer: Self-pay | Admitting: Cardiovascular Disease

## 2012-05-13 DIAGNOSIS — Z951 Presence of aortocoronary bypass graft: Secondary | ICD-10-CM

## 2012-05-13 LAB — GLUCOSE, CAPILLARY: Glucose-Capillary: 120 mg/dL — ABNORMAL HIGH (ref 70–99)

## 2012-05-13 MED ORDER — ASPIRIN 325 MG PO TBEC: 325.0000 mg | DELAYED_RELEASE_TABLET | Freq: Every day | ORAL | Status: DC

## 2012-05-13 NOTE — Care Management Note (Signed)
    Page 1 of 1   05/13/2012     1:38:53 PM   CARE MANAGEMENT NOTE 05/13/2012  Patient:  Philip [name], Philip [name]   Account Number:  [id number]  Date Initiated:  05/13/2012  Documentation initiated by:  Arlyne Brandes  Subjective/Objective Assessment:   PT S/P CABG X 3 ON 05/07/12.  PTA, PT INDEPENDENT, LIVES WITH WIFE.     Action/Plan:   PT FOR DC HOME TODAY WITH WIFE AS CAREGIVER.  PT HAS NO DC NEEDS.   Anticipated DC Date:  05/13/2012   Anticipated DC Plan:  HOME/SELF CARE      DC Planning Services  CM consult      Choice offered to / List presented to:             Status of service:  Completed, signed off Medicare Important Message given?   (If response is "NO", the following Medicare IM given date fields will be blank) Date Medicare IM given:   Date Additional Medicare IM given:    Discharge Disposition:  HOME/SELF CARE  Per UR Regulation:  Reviewed for med. necessity/level of care/duration of stay  If discussed at Long Length of Stay Meetings, dates discussed:    Comments:

## 2012-05-13 NOTE — Progress Notes (Signed)
[id number] Pt and family received ed on Sat but pt needed a review this am. Reviewed activity, sternal precautions, importance of diabetic diet, IS, and CRP 2. Pt gave permission to refer to [facility] Phase 2. Put on D/C video for pt to view. Family came in as I was leaving. Answered questions. Pt has diabetic and heart healthy diets. Discussed importance of getting Hgaic down. Kenzi Bardwell DunlapRN

## 2012-05-13 NOTE — Progress Notes (Signed)
                   [address]            [place] [address]          [phone]      6 Days Post-Op Procedure(s) (LRB): CORONARY ARTERY BYPASS GRAFTING (CABG) (N/A) INTRAOPERATIVE TRANSESOPHAGEAL ECHOCARDIOGRAM (N/A)  Subjective: Patient without complaints this am. He is eating breakfast. He wants to go home.  Objective: Vital signs in last 24 hours: Temp:  [98.6 F (37 C)-99 F (37.2 C)] 98.8 F (37.1 C) (01/13 0414) Pulse Rate:  [61-80] 66  (01/13 0414) Cardiac Rhythm:  [-] Normal sinus rhythm (01/12 2000) Resp:  [12-18] 12  (01/13 0414) BP: (118-129)/(70-86) 124/70 mmHg (01/13 0414) SpO2:  [93 %-94 %] 93 % (01/13 0414) Weight:  [98.793 kg (217 lb 12.8 oz)] 98.793 kg (217 lb 12.8 oz) (01/13 0414)  Pre op weight  99 kg Current Weight  05/13/12 98.793 kg (217 lb 12.8 oz)      Intake/Output from previous day: 01/12 0701 - 01/13 0700 In: 240 [P.O.:240] Out: -    Physical Exam:  Cardiovascular: RRR, no murmurs, gallops, or rubs. Pulmonary: Clear to auscultation bilaterally; no rales, wheezes, or rhonchi. Abdomen: Soft, non tender, bowel sounds present. Extremities: Trace bilateral lower extremity edema. Wounds: Clean and dry.  No erythema or signs of infection.  Lab Results: CBC: Basename 05/11/12 0516  WBC 9.2  HGB 12.8*  HCT 37.6*  PLT 172   BMET:  Basename 05/11/12 0516  NA 136  K 3.6  CL 101  CO2 25  GLUCOSE 109*  BUN 17  CREATININE 0.84  CALCIUM 8.6    PT/INR:  Lab Results  Component Value Date   INR 1.35 05/07/2012   INR 1.02 05/06/2012   ABG:  INR: Will add last result for INR, ABG once components are confirmed Will add last 4 CBG results once components are confirmed  Assessment/Plan:  1. CV - Previous afib.Had some episodes last the evening of 1/10.Maintaining SR since then. On Amiodarone 200 bid, Lopressor 25 bid 2.  Pulmonary - Encourage incentive spirometer 3. Volume Overload - On Lasix 40 daily 4.  Acute blood  loss anemia - Last H and H 12.8 and 37.6 5.DM- CBGs 168/106/120. On Metformin and Insulin. Pre op HGA1C 11.9. Will need follow up as an outpatient 6.Discharge today  ZIMMERMAN,DONIELLE MPA-C 05/13/2012,7:23 AM

## 2012-05-17 ENCOUNTER — Other Ambulatory Visit: Payer: Self-pay | Admitting: *Deleted

## 2012-05-17 DIAGNOSIS — E119 Type 2 diabetes mellitus without complications: Secondary | ICD-10-CM

## 2012-05-17 MED ORDER — GLUCOSE BLOOD VI STRP: ORAL_STRIP | Status: DC

## 2012-05-17 MED ORDER — FREESTYLE SYSTEM KIT: 1.0000 | PACK | Status: DC | PRN

## 2012-05-17 MED ORDER — LANCETS ULTRA THIN MISC: 1.0000 | Freq: Three times a day (TID) | Status: DC

## 2012-06-04 ENCOUNTER — Other Ambulatory Visit: Payer: Self-pay | Admitting: *Deleted

## 2012-06-04 DIAGNOSIS — Z951 Presence of aortocoronary bypass graft: Secondary | ICD-10-CM

## 2012-06-04 DIAGNOSIS — I2510 Atherosclerotic heart disease of native coronary artery without angina pectoris: Secondary | ICD-10-CM

## 2012-06-06 ENCOUNTER — Ambulatory Visit (INDEPENDENT_AMBULATORY_CARE_PROVIDER_SITE_OTHER): Payer: Self-pay | Admitting: Cardiothoracic Surgery

## 2012-06-06 ENCOUNTER — Ambulatory Visit
Admission: RE | Admit: 2012-06-06 | Discharge: 2012-06-06 | Disposition: A | Discharge status: Home / Self Care | Payer: Medicare Other | Source: Ambulatory Visit | Attending: Cardiothoracic Surgery | Admitting: Cardiothoracic Surgery

## 2012-06-06 ENCOUNTER — Encounter: Payer: Self-pay | Admitting: Cardiothoracic Surgery

## 2012-06-06 VITALS — BP 128/72 | HR 45 | Resp 20 | Ht 71.0 in | Wt 217.0 lb

## 2012-06-06 DIAGNOSIS — Z0279 Encounter for issue of other medical certificate: Secondary | ICD-10-CM

## 2012-06-06 DIAGNOSIS — I2510 Atherosclerotic heart disease of native coronary artery without angina pectoris: Secondary | ICD-10-CM

## 2012-06-06 DIAGNOSIS — Z951 Presence of aortocoronary bypass graft: Secondary | ICD-10-CM

## 2012-06-06 NOTE — Patient Instructions (Signed)
Coronary Artery Bypass Grafting  Care After  Refer to this sheet in the next few weeks. These instructions provide you with information on caring for yourself after your procedure. Your caregiver may also give you more specific instructions. Your treatment has been planned according to current medical practices, but problems sometimes occur. Call your caregiver if you have any problems or questions after your procedure.  Recovery from open heart surgery will be different for everyone. Some people feel well after 3 or 4 weeks, while for others it takes longer. After heart surgery, it may be normal to:  Not have an appetite, feel nauseated by the smell of food, or only want to eat a small amount.   Be constipated because of changes in your diet, activity, and medicines. Eat foods high in fiber. Add fresh fruits and vegetables to your diet. Stool softeners may be helpful.   Feel sad or unhappy. You may be frustrated or cranky. You may have good days and bad days. Do not give up. Talk to your caregiver if you do not feel better.   Feel weakness and fatigue. You many need physical therapy or cardiac rehabilitation to get your strength back.   Develop an irregular heartbeat called atrial fibrillation. Symptoms of atrial fibrillation are a fast, irregular heartbeat or feelings of fluttery heartbeats, shortness of breath, low blood pressure, and dizziness. If these symptoms develop, see your caregiver right away.  MEDICATION  Have a list of all the medicines you will be taking when you leave the hospital. For every medicine, know the following:   Name.   Exact dose.   Time of day to be taken.   How often it should be taken.   Why you are taking it.   Ask which medicines should or should not be taken together. If you take more than one heart medicine, ask if it is okay to take them together. Some heart medicines should not be taken at the same time because they may lower your blood pressure too  much.   Narcotic pain medicine can cause constipation. Eat fresh fruits and vegetables. Add fiber to your diet. Stool softener medicine may help relieve constipation.   Keep a copy of your medicines with you at all times.   Do not add or stop taking any medicine until you check with your caregiver.   Medicines can have side effects. Call your caregiver who prescribed the medicine if you:   Start throwing up, have diarrhea, or have stomach pain.   Feel dizzy or lightheaded when you stand up.   Feel your heart is skipping beats or is beating too fast or too slow.   Develop a rash.   Notice unusual bruising or bleeding.  HOME CARE INSTRUCTIONS  After heart surgery, it is important to learn how to take your pulse. Have your caregiver show you how to take your pulse.   Use your incentive spirometer. Ask your caregiver how long after surgery you need to use it.  Care of your chest incision  Tell your caregiver right away if you notice clicking in your chest (sternum).   Support your chest with a pillow or your arms when you take deep breaths and cough.   Follow your caregiver's instructions about when you can bathe or swim.   Protect your incision from sunlight during the first year to keep the scar from getting dark.   Tell your caregiver if you notice:   Increased tenderness of your incision.  Increased redness or swelling around your incision.   Drainage or pus from your incision.  Care of your leg incision(s)  Avoid crossing your legs.   Avoid sitting for long periods of time. Change positions every half hour.   Elevate your leg(s) when you are sitting.   Check your leg(s) daily for swelling. Check the incisions for redness or drainage.   Diet is very important to heart health.   Eat plenty of fresh fruits and vegetables. Meats should be lean cut. Avoid canned, processed, and fried foods.   Talk to a dietician. They can teach you how to make healthy food and  drink choices.  Weight  Weigh yourself every day. This is important because it helps to know if you are retaining fluid that may make your heart and lungs work harder.   Use the same scale each time.   Weigh yourself every morning at the same time. You should do this after you go to the bathroom, but before you eat breakfast.   Your weight will be more accurate if you do not wear any clothes.   Record your weight.   Tell your caregiver if you have gained 2 pounds or more overnight.  Activity Stop any activity at once if you have chest pain, shortness of breath, irregular heartbeats, or dizziness. Get help right away if you have any of these symptoms.  Bathing.  Avoid soaking in a bath or hot tub until your incisions are healed.   Rest. You need a balance of rest and activity.   Exercise. Exercise per your caregiver's advice. You may need physical therapy or cardiac rehabilitation to help strengthen your muscles and build your endurance.   Climbing stairs. Unless your caregiver tells you not to climb stairs, go up stairs slowly and rest if you tire. Do not pull yourself up by the handrail.   Driving a car. Follow your caregiver's advice on when you may drive. You may ride as a passenger at any time. When traveling for long periods of time in a car, get out of the car and walk around for a few minutes every 2 hours.   Lifting. Avoid lifting, pushing, or pulling anything heavier than 10 pounds for 6 weeks after surgery or as told by your caregiver.   Returning to work. Check with your caregiver. People heal at different rates. Most people will be able to go back to work 6 to 12 weeks after surgery.   Sexual activity. You may resume sexual relations as told by your caregiver.  SEEK MEDICAL CARE IF:  Any of your incisions are red, painful, or have any type of drainage coming from them.   You have an oral temperature above 101.5 F .   You have ankle or leg swelling.   You have pain  in your legs.   You have weight gain of 2 or more pounds a day.   You feel dizzy or lightheaded when you stand up.  SEEK IMMEDIATE MEDICAL CARE IF:  You have angina or chest pain that goes to your jaw or arms. Call your local emergency services right away.   You have shortness of breath at rest or with activity.   You have a fast or irregular heartbeat (arrhythmia).   There is a "clicking" in your sternum when you move.   You have numbness or weakness in your arms or legs.  MAKE SURE YOU:  Understand these instructions.   Will watch your condition.   Will  get help right away if you are not doing well or get worse.    No lifting over 25 lbs for 3 months

## 2012-06-06 NOTE — Progress Notes (Signed)
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  POSTOPERATIVE DIAGNOSES: Same  SURGICAL PROCEDURE: Coronary artery bypass grafting x3 with left  internal mammary to the left anterior descending coronary artery,  reverse saphenous vein graft to the circumflex coronary artery and  reverse saphenous vein graft to posterior descending coronary artery  with left leg endo vein harvesting.     History of Present Illness:      Patient returns to office today after the coronary artery bypass grafting January 7. He's made excellent progress at home. He's been very diligent about diabetes care and management with his new diagnosis of diabetes. He's had no angina or evidence of heart failure.     History  Smoking status  . Former Smoker -- 1.0 packs/day for 8 years  . Types: Cigarettes  . Quit date: 12/31/1967  Smokeless tobacco  . Never Used       Allergies  Allergen Reactions  . Iodine Rash  . Penicillins Hives, Itching, Rash and Other (See Comments)    "broke out in welps" (05/06/2012)  . Sulfa Antibiotics Shortness Of Breath  . Iodine     Rash and itching  . Penicillins     Hives  . Sulfa Antibiotics     Difficulty Breathing    Current Outpatient Prescriptions  Medication Sig Dispense Refill  . amiodarone (PACERONE) 200 MG tablet Take 1 tablet (200 mg total) by mouth 2 (two) times daily.  60 tablet  1  . aspirin 81 MG tablet Take 81 mg by mouth daily. 2 tabs in am and 2 tabs in pm      . atorvastatin (LIPITOR) 10 MG tablet Take 10 mg by mouth daily.      . insulin  aspart (NOVOLOG FLEXPEN) 100 UNIT/ML injection Inject 5 Units into the skin 3 (three) times daily before meals.  1 pen  12  . insulin detemir (LEVEMIR FLEXPEN) 100 UNIT/ML injection Inject 23 Units into the skin at bedtime.  3 mL  12  . levothyroxine (SYNTHROID, LEVOTHROID) 25 MCG tablet Take 25 mcg by mouth daily.      [name] loratadine (CLARITIN) 10 MG tablet Take 10 mg by mouth daily as needed. For allergies      . metFORMIN (GLUCOPHAGE) 500 MG tablet Take 500 mg by mouth 2 (two) times daily with a meal.      . metoprolol tartrate (LOPRESSOR) 25 MG tablet Take 1 tablet (25 mg total) by mouth 2 (two) times daily.  60 tablet  1  . glucose blood test strip Test before meals and at bedtime  100 each  5  . glucose monitoring kit (FREESTYLE) monitoring kit 1 each by Does not apply route as needed for other.  1 each  0  . Insulin Pen Needle (NOVOFINE) 30G X 8 MM MISC Inject 10 each into the skin as needed.  100 each  12  . LANCETS ULTRA THIN MISC 1  Device by Does not apply route 4 (four) times daily -  before meals and at bedtime.  200 each  5       Physical Exam: BP 128/72  Pulse 45  Resp 20  Ht 5\' 11"  (1.803 m)  Wt 217 lb (98.431 kg)  BMI 30.27 kg/m2  SpO2 98%  General appearance: alert, cooperative and appears stated age Neurologic: intact Heart: regular rate and rhythm, S1, S2 normal, no murmur, click, rub or gallop and normal apical impulse Lungs: clear to auscultation bilaterally and normal percussion bilaterally Abdomen: soft, non-tender; bowel sounds normal; no masses,  no organomegaly Extremities: extremities normal, atraumatic, no cyanosis or edema and Homans sign is negative, no sign of DVT Wound: Patient sternum is stable and well healed, the bilateral leg vein Endo sites are also well-healed Wounds:  Diagnostic Studies & Laboratory data:         Recent Radiology Findings: Dg Chest 2 View  06/06/2012  *RADIOLOGY REPORT*  Clinical Data: CABG 4 weeks ago, follow-up  CHEST - 2  VIEW  Comparison: None.  Findings: There is opacity at the left lung base consistent with a small left pleural effusion with mild left basilar atelectasis. The right lung is clear.  No pneumothorax is seen.  Mediastinal contours are normal.  The heart is mildly enlarged.  Median sternotomy sutures are noted from prior CABG. A lower anterior cervical spine fusion plate is present.  IMPRESSION: Small left pleural effusion with mild left basilar atelectasis.   Original Report Authenticated By: Dwyane Dee, M.D.       Recent Labs: Lab Results  Component Value Date   WBC 9.2 05/11/2012   HGB 12.8* 05/11/2012   HCT 37.6* 05/11/2012   PLT 172 05/11/2012   GLUCOSE 109* 05/11/2012   NA 136 05/11/2012   K 3.6 05/11/2012   CL 101 05/11/2012   CREATININE 0.84 05/11/2012   BUN 17 05/11/2012   CO2 25 05/11/2012   INR 1.35 05/07/2012   HGBA1C 11.9* 05/06/2012      Assessment / Plan:      Patient is status post coronary artery bypass grafting approximately one month, he is doing well postoperatively without recurrent angina or congestive heart failure. I've encouraged him to start in the cardiac rehabilitation program, and at stress the need for him to continue with good diabetes care, lipid management, and taking aspirin. I have not made a return appointment to see me, but would be glad to see him at his or cardiology request.       Philip [name] B 06/06/2012 12:59 PM

## 2012-06-17 ENCOUNTER — Ambulatory Visit (INDEPENDENT_AMBULATORY_CARE_PROVIDER_SITE_OTHER): Payer: Medicare Other | Admitting: Cardiovascular Disease

## 2012-06-17 ENCOUNTER — Encounter: Payer: Self-pay | Admitting: Cardiovascular Disease

## 2012-06-17 VITALS — BP 128/78 | HR 50 | Ht 71.0 in | Wt 219.8 lb

## 2012-06-17 DIAGNOSIS — I2510 Atherosclerotic heart disease of native coronary artery without angina pectoris: Secondary | ICD-10-CM | POA: Insufficient documentation

## 2012-06-17 DIAGNOSIS — Z951 Presence of aortocoronary bypass graft: Secondary | ICD-10-CM

## 2012-06-17 DIAGNOSIS — E7800 Pure hypercholesterolemia, unspecified: Secondary | ICD-10-CM

## 2012-06-17 MED ORDER — AMIODARONE HCL 200 MG PO TABS: 200.0000 mg | ORAL_TABLET | Freq: Every day | ORAL | Status: DC

## 2012-06-17 MED ORDER — ATORVASTATIN CALCIUM 40 MG PO TABS: 40.0000 mg | ORAL_TABLET | Freq: Every day | ORAL | Status: DC

## 2012-06-17 NOTE — Assessment & Plan Note (Addendum)
He is doing very well after CABG. He has no symptoms suggestive of angina. Continue medical therapy. I advised him to attend cardiac rehabilitation. I will decrease amiodarone to 200 mg once daily. If she remains in sinus rhythm after 2 months, I will stop this medication.

## 2012-06-17 NOTE — Assessment & Plan Note (Signed)
Given his extensive atherosclerosis, I will increase atorvastatin to 40 mg daily. He will need a followup lipid and liver profile in 4-8 weeks.

## 2012-06-17 NOTE — Progress Notes (Signed)
HPI  This is a 67 year old male who is here today for a followup visit. I saw him in October for atypical new onset angina with exertional neck and throat tightness.  He was recently diagnosed with type 2 diabetes, hyperlipidemia and hypothyroidism. He underwent a treadmill stress test which was highly abnormal. I proceeded with cardiac catheterization which showed severe three-vessel coronary artery disease. He was transferred to [facility] where he underwent CABG without complications. He did have postoperative atrial fibrillation which was treated with amiodarone. All his anginal symptoms resolved completely. He is doing very well at this time. He has not started cardiac rehabilitation yet.  Allergies  Allergen Reactions  . Iodine Rash  . Penicillins Hives, Itching, Rash and Other (See Comments)    "broke out in welps" (05/06/2012)  . Sulfa Antibiotics Shortness Of Breath  . Iodine     Rash and itching  . Penicillins     Hives  . Sulfa Antibiotics     Difficulty Breathing     Current Outpatient Prescriptions on File Prior to Visit  Medication Sig Dispense Refill  . glucose blood test strip Test before meals and at bedtime  100 each  5  . glucose monitoring kit (FREESTYLE) monitoring kit 1 each by Does not apply route as needed for other.  1 each  0  . insulin aspart (NOVOLOG FLEXPEN) 100 UNIT/ML injection Inject 5 Units into the skin 3 (three) times daily before meals.  1 pen  12  . insulin detemir (LEVEMIR FLEXPEN) 100 UNIT/ML injection Inject 23 Units into the skin at bedtime.  3 mL  12  . Insulin Pen Needle (NOVOFINE) 30G X 8 MM MISC Inject 10 each into the skin as needed.  100 each  12  . LANCETS ULTRA THIN MISC 1 Device by Does not apply route 4 (four) times daily -  before meals and at bedtime.  200 each  5  . levothyroxine (SYNTHROID, LEVOTHROID) 25 MCG tablet Take 25 mcg by mouth daily.      [name] loratadine (CLARITIN) 10 MG tablet Take 10 mg by mouth daily as needed. For  allergies      . metFORMIN (GLUCOPHAGE) 500 MG tablet Take 500 mg by mouth 2 (two) times daily with a meal.      . metoprolol tartrate (LOPRESSOR) 25 MG tablet Take 1 tablet (25 mg total) by mouth 2 (two) times daily.  60 tablet  1   No current facility-administered medications on file prior to visit.     Past Medical History  Diagnosis Date  . Diabetes mellitus without complication   . Thyroid disease   . 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 History of bronchitis ~ 01/2012  . Atypical angina 05/06/2012  . Exertional dyspnea     "for the last few months" (05/06/2012)  . Type II diabetes mellitus 05/06/2012    "just found out; haven't had 2 doses of the RX yet" ((05/06/2012)  . Hypothyroidism   . Arthritis     "right knee" (05/06/2012)  . Coronary artery disease     Angina with highly abnormal stress test in October of 2013. Cardiac catheterization showed severe three-vessel coronary artery disease. He underwent CABG by Dr. Nydia Bouton in October of 2013 with LIMA to LAD, SVG to left circumflex and SVG to right PDA  . Hypercholesteremia 03/2012     Past Surgical History  Procedure Laterality Date  . Knee surgery  2003  right  . Neck surgery  2008    Plate in neck  . Knee arthroscopy  ~ 2000    "right" (05/06/2012)  . Anterior cervical decomp/discectomy fusion  ~ 2009  . Intraoperative transesophageal echocardiogram  05/07/2012    Procedure: INTRAOPERATIVE TRANSESOPHAGEAL ECHOCARDIOGRAM;  Surgeon: Delight Ovens, MD;  Location: [facility] OR;  Service: Open Heart Surgery;  Laterality: N/A;  . Coronary artery bypass graft  05/07/2012    Procedure: CORONARY ARTERY BYPASS GRAFTING (CABG);  Surgeon: Delight Ovens, MD;  Location: [facility] OR;  Service: Open Heart Surgery;  Laterality: N/A;  times three  . Cardiac catheterization  05/06/2012    Significant three-vessel coronary artery disease with normal ejection fraction     Family History  Problem Relation Age of Onset  .  Heart disease Mother   . Clotting disorder Mother   . Hypertension Sister      History   Social History  . Marital Status: Married    Spouse Name: N/A    Number of Children: N/A  . Years of Education: N/A   Occupational History  . Not on file.   Social History Main Topics  . Smoking status: Former Smoker -- 1.00 packs/day for 8 years    Types: Cigarettes    Quit date: 12/31/1967  . Smokeless tobacco: Never Used  . Alcohol Use: Yes     Comment: 05/06/2012 "last alcohol several years ago; never had problem wit"  . Drug Use: No  . Sexually Active: Not Currently   Other Topics Concern  . Not on file   Social History Narrative   ** Merged History Encounter **            PHYSICAL EXAM   BP 128/78  Pulse 50  Ht 5\' 11"  (1.803 m)  Wt 219 lb 12 oz (99.678 kg)  BMI 30.66 kg/m2  Constitutional: He is oriented to person, place, and time. He appears well-developed and well-nourished. No distress.  HENT: No nasal discharge.  Head: Normocephalic and atraumatic.  Eyes: Pupils are equal and round. Right eye exhibits no discharge. Left eye exhibits no discharge.  Neck: Normal range of motion. Neck supple. No JVD present. No thyromegaly present.  Cardiovascular: Normal rate, regular rhythm, normal heart sounds and. Exam reveals no gallop and no friction rub. No murmur heard.  Pulmonary/Chest: Effort normal and breath sounds normal. No stridor. No respiratory distress. He has no wheezes. He has no rales. He exhibits no tenderness.  Abdominal: Soft. Bowel sounds are normal. He exhibits no distension. There is no tenderness. There is no rebound and no guarding.  Musculoskeletal: Normal range of motion. He exhibits no edema and no tenderness.  Neurological: He is alert and oriented to person, place, and time. Coordination normal.  Skin: Skin is warm and dry. No rash noted. He is not diaphoretic. No erythema. No pallor.  Psychiatric: He has a normal mood and affect. His behavior is  normal. Judgment and thought content normal.      EKG: Sinus  Bradycardia  -Poor R-wave progression -nonspecific -consider old anterior infarct.   -Nonspecific ST changes  ABNORMAL    ASSESSMENT AND PLAN

## 2012-06-17 NOTE — Patient Instructions (Addendum)
Decrease Amiodarone to 200 mg once daily.  Increase Atorvastatin to 40 mg daily.   Try to attend cardiac rehab.  Follow up in 2 months.

## 2012-06-25 ENCOUNTER — Encounter: Payer: Self-pay | Admitting: Cardiovascular Disease

## 2012-06-29 ENCOUNTER — Encounter: Payer: Self-pay | Admitting: Cardiovascular Disease

## 2012-07-08 ENCOUNTER — Other Ambulatory Visit: Payer: Self-pay | Admitting: Physician Assistant

## 2012-07-19 ENCOUNTER — Other Ambulatory Visit: Payer: Self-pay

## 2012-07-19 MED ORDER — METOPROLOL TARTRATE 25 MG PO TABS: 25.0000 mg | ORAL_TABLET | Freq: Two times a day (BID) | ORAL | Status: DC

## 2012-07-29 ENCOUNTER — Other Ambulatory Visit: Payer: Self-pay | Admitting: Thoracic Surgery (Cardiothoracic Vascular Surgery)

## 2012-07-30 ENCOUNTER — Encounter: Payer: Self-pay | Admitting: Cardiovascular Disease

## 2012-08-20 ENCOUNTER — Ambulatory Visit (INDEPENDENT_AMBULATORY_CARE_PROVIDER_SITE_OTHER): Payer: Medicare Other | Admitting: Cardiovascular Disease

## 2012-08-20 ENCOUNTER — Encounter: Payer: Self-pay | Admitting: Cardiovascular Disease

## 2012-08-20 VITALS — BP 144/80 | HR 47 | Ht 71.0 in | Wt 225.0 lb

## 2012-08-20 DIAGNOSIS — I2510 Atherosclerotic heart disease of native coronary artery without angina pectoris: Secondary | ICD-10-CM

## 2012-08-20 DIAGNOSIS — R0602 Shortness of breath: Secondary | ICD-10-CM

## 2012-08-20 DIAGNOSIS — E7800 Pure hypercholesterolemia, unspecified: Secondary | ICD-10-CM

## 2012-08-20 MED ORDER — METOPROLOL TARTRATE 25 MG PO TABS: 12.5000 mg | ORAL_TABLET | Freq: Two times a day (BID) | ORAL | Status: DC

## 2012-08-20 NOTE — Patient Instructions (Addendum)
Decrease Metoprolol to 12.5 mg (1/2 tablet) twice daily  Schedule chest X ray (pleural effusion) Follow up in 6 months.

## 2012-08-20 NOTE — Assessment & Plan Note (Signed)
Continue treatment with atorvastatin. He is going to have blood work done with Dr. Juanetta Gosling in the near future.

## 2012-08-20 NOTE — Progress Notes (Signed)
HPI  This is a 67 year old male who is here today for a followup visit regarding coronary artery disease status post CABG in January of this year.  He had postoperative atrial fibrillation which was treated with amiodarone. All his anginal symptoms resolved completely. He has not been able to continue cardiac rehabilitation due to cost. He still complains of some discomfort at the surgical site and also some discomfort when he takes a deep breath. He feels somewhat out of breath but he has not been exercising on a regular basis. He did have a small pleural effusion postoperatively. 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 He has been bradycardic but denies any dizziness, syncope or presyncope. He complains of a change in his voice since the surgery.  Allergies  Allergen Reactions  . Iodine Rash  . Penicillins Hives, Itching, Rash and Other (See Comments)    "broke out in welps" (05/06/2012)  . Sulfa Antibiotics Shortness Of Breath  . Iodine     Rash and itching  . Penicillins     Hives  . Sulfa Antibiotics     Difficulty Breathing     Current Outpatient Prescriptions on File Prior to Visit  Medication Sig Dispense Refill  . aspirin 325 MG tablet Take 325 mg by mouth daily.      [name] atorvastatin (LIPITOR) 40 MG tablet Take 1 tablet (40 mg total) by mouth daily.  30 tablet  6  . FREESTYLE LITE test strip USE TO CHECK BLOOD SUGAR 3 TIMES DAILY  100 each  0  . glucose monitoring kit (FREESTYLE) monitoring kit 1 each by Does not apply route as needed for other.  1 each  0  . insulin aspart (NOVOLOG FLEXPEN) 100 UNIT/ML injection Inject 5 Units into the skin 3 (three) times daily before meals.  1 pen  12  . insulin detemir (LEVEMIR FLEXPEN) 100 UNIT/ML injection Inject 23 Units into the skin at bedtime.  3 mL  12  . Insulin Pen Needle (NOVOFINE) 30G X 8 MM MISC Inject 10 each into the skin as needed.  100 each  12  . LANCETS ULTRA THIN MISC 1 Device by Does not apply  route 4 (four) times daily -  before meals and at bedtime.  200 each  5  . levothyroxine (SYNTHROID, LEVOTHROID) 25 MCG tablet Take 25 mcg by mouth daily.      [name] loratadine (CLARITIN) 10 MG tablet Take 10 mg by mouth daily as needed. For allergies      . metFORMIN (GLUCOPHAGE) 500 MG tablet Take 500 mg by mouth 2 (two) times daily with a meal.      . oxycodone (OXY-IR) 5 MG capsule Take 1-2 tablets every 3 hours as needed.       No current facility-administered medications on file prior to visit.     Past Medical History  Diagnosis Date  . Diabetes mellitus without complication   . Thyroid disease   . 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 History of bronchitis ~ 01/2012  . Atypical angina 05/06/2012  . Exertional dyspnea     "for the last few months" (05/06/2012)  . Type II diabetes mellitus 05/06/2012    "just found out; haven't had 2 doses of the RX yet" ((05/06/2012)  . Hypothyroidism   . Arthritis     "right knee" (05/06/2012)  . Coronary artery disease     Angina with highly abnormal stress test  in October of 2013. Cardiac catheterization showed severe three-vessel coronary artery disease. He underwent CABG by Dr. Nydia Bouton in October of 2013 with LIMA to LAD, SVG to left circumflex and SVG to right PDA  . Hypercholesteremia 03/2012     Past Surgical History  Procedure Laterality Date  . Knee surgery  2003    right  . Neck surgery  2008    Plate in neck  . Knee arthroscopy  ~ 2000    "right" (05/06/2012)  . Anterior cervical decomp/discectomy fusion  ~ 2009  . Intraoperative transesophageal echocardiogram  05/07/2012    Procedure: INTRAOPERATIVE TRANSESOPHAGEAL ECHOCARDIOGRAM;  Surgeon: Delight Ovens, MD;  Location: [facility] OR;  Service: Open Heart Surgery;  Laterality: N/A;  . Coronary artery bypass graft  05/07/2012    Procedure: CORONARY ARTERY BYPASS GRAFTING (CABG);  Surgeon: Delight Ovens, MD;  Location: [facility] OR;  Service: Open Heart Surgery;   Laterality: N/A;  times three  . Cardiac catheterization  05/06/2012    Significant three-vessel coronary artery disease with normal ejection fraction     Family History  Problem Relation Age of Onset  . Heart disease Mother   . Clotting disorder Mother   . Hypertension Sister      History   Social History  . Marital Status: Married    Spouse Name: N/A    Number of Children: N/A  . Years of Education: N/A   Occupational History  . Not on file.   Social History Main Topics  . Smoking status: Former Smoker -- 1.00 packs/day for 8 years    Types: Cigarettes    Quit date: 12/31/1967  . Smokeless tobacco: Never Used  . Alcohol Use: Yes     Comment: 05/06/2012 "last alcohol several years ago; never had problem wit"  . Drug Use: No  . Sexually Active: Not Currently   Other Topics Concern  . Not on file   Social History Narrative   ** Merged History Encounter **            PHYSICAL EXAM   BP 144/80  Pulse 47  Ht 5\' 11"  (1.803 m)  Wt 225 lb (102.059 kg)  BMI 31.39 kg/m2  Constitutional: He is oriented to person, place, and time. He appears well-developed and well-nourished. No distress.  HENT: No nasal discharge.  Head: Normocephalic and atraumatic.  Eyes: Pupils are equal and round. Right eye exhibits no discharge. Left eye exhibits no discharge.  Neck: Normal range of motion. Neck supple. No JVD present. No thyromegaly present.  Cardiovascular: Normal rate, regular rhythm, normal heart sounds and. Exam reveals no gallop and no friction rub. No murmur heard.  Pulmonary/Chest: Effort normal and breath sounds normal. No stridor. No respiratory distress. He has no wheezes. He has no rales. He exhibits no tenderness.  Abdominal: Soft. Bowel sounds are normal. He exhibits no distension. There is no tenderness. There is no rebound and no guarding.  Musculoskeletal: Normal range of motion. He exhibits no edema and no tenderness.  Neurological: He is alert and oriented to  person, place, and time. Coordination normal.  Skin: Skin is warm and dry. No rash noted. He is not diaphoretic. No erythema. No pallor.  Psychiatric: He has a normal mood and affect. His behavior is normal. Judgment and thought content normal.      EKG: Marked sinus  Bradycardia  -Poor R-wave progression   -  Nonspecific T-abnormality.   ABNORMAL   ASSESSMENT AND PLAN

## 2012-08-20 NOTE — Assessment & Plan Note (Addendum)
He is  doing reasonably well after CABG. No convincing symptoms of angina. He continues to complain of some discomfort with deep breath  as well as dyspnea. He did have previous left pleural effusion. I will obtain a chest x-ray for followup.  He can work with restriction of no lifting of more than 30 pounds.  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 If this does not improve in the near future, he might need ENT evaluation.  Due to bradycardia, I will go ahead and decrease metoprolol to 12.5 mg twice daily.

## 2012-08-22 ENCOUNTER — Ambulatory Visit: Payer: Self-pay | Admitting: Cardiovascular Disease

## 2012-08-23 ENCOUNTER — Telehealth: Payer: Self-pay | Admitting: *Deleted

## 2012-08-23 ENCOUNTER — Other Ambulatory Visit: Payer: Self-pay | Admitting: *Deleted

## 2012-08-23 MED ORDER — FUROSEMIDE 20 MG PO TABS: 20.0000 mg | ORAL_TABLET | Freq: Every day | ORAL | Status: DC

## 2012-08-23 MED ORDER — POTASSIUM CHLORIDE 20 MEQ PO PACK: 20.0000 meq | PACK | Freq: Every day | ORAL | Status: DC

## 2012-08-23 NOTE — Telephone Encounter (Signed)
Pt needs order for BMP in 1 week and CXR in 3 weeks.  Will forward to [facility] Nurse to order.

## 2012-08-23 NOTE — Telephone Encounter (Signed)
Spoke with patient and gave results of CXR.  Called in lasix and K+ per Dr. Kirke Corin order.

## 2012-08-23 NOTE — Telephone Encounter (Signed)
Pt returning nurse call for xray result

## 2012-08-26 ENCOUNTER — Telehealth: Payer: Self-pay | Admitting: *Deleted

## 2012-08-26 ENCOUNTER — Other Ambulatory Visit: Payer: Self-pay

## 2012-08-26 DIAGNOSIS — R609 Edema, unspecified: Secondary | ICD-10-CM

## 2012-08-26 NOTE — Telephone Encounter (Signed)
Pt requesting refill for Potassium Chloride 20 meq packet. Pt is requesting tablet is that ok?

## 2012-08-27 ENCOUNTER — Telehealth: Payer: Self-pay | Admitting: Cardiovascular Disease

## 2012-08-27 ENCOUNTER — Other Ambulatory Visit: Payer: Self-pay | Admitting: *Deleted

## 2012-08-27 MED ORDER — POTASSIUM CHLORIDE 20 MEQ PO PACK: 20.0000 meq | PACK | Freq: Every day | ORAL | Status: DC

## 2012-08-27 NOTE — Telephone Encounter (Signed)
That's fine

## 2012-08-27 NOTE — Telephone Encounter (Signed)
New Prob     Needs clarification on the directions/dosage of POTASSIUM. Would like to speak to nurse.

## 2012-08-27 NOTE — Telephone Encounter (Signed)
Refill sent in for Potassium 20 meq tablet to [facility].

## 2012-08-27 NOTE — Telephone Encounter (Signed)
Pt requesting Potassium Tablet instead of packet refill sent for Potassium 20 meq tablet to [facility] pharmacy.

## 2012-08-29 ENCOUNTER — Other Ambulatory Visit: Payer: Self-pay

## 2012-08-29 MED ORDER — POTASSIUM CHLORIDE CRYS ER 20 MEQ PO TBCR: 20.0000 meq | EXTENDED_RELEASE_TABLET | Freq: Every day | ORAL | Status: DC

## 2012-08-29 NOTE — Telephone Encounter (Signed)
New Rx sent for Potassium 20 meq tablet take one tablet daily.

## 2012-09-04 ENCOUNTER — Telehealth: Payer: Self-pay

## 2012-09-04 NOTE — Telephone Encounter (Signed)
Decrease Aspirin to 81 mg daily. If nose bleed keeps happening, he will need to see ENT.

## 2012-09-04 NOTE — Telephone Encounter (Signed)
Pt concerned about a nosebleed that he "could not get stopped" last week Taking ASA 325 mg daily He has been holding the ASA x 2 days and asks if he can decrease to 81 mg daily I advised we would rather him take 81 mg daily versus nothing at all and ok to go ahead and decrease until I can talk with Dr. Kirke Corin Understanding verb

## 2012-09-05 ENCOUNTER — Other Ambulatory Visit: Payer: Self-pay

## 2012-09-05 NOTE — Telephone Encounter (Signed)
I left a message on the patient's identified voice mail of Dr. Jari Sportsman recommendations and to call back with any questions.

## 2012-09-05 NOTE — Telephone Encounter (Signed)
Called pharmacy. This was already addressed.

## 2012-09-06 ENCOUNTER — Ambulatory Visit (INDEPENDENT_AMBULATORY_CARE_PROVIDER_SITE_OTHER): Payer: Medicare Other

## 2012-09-06 DIAGNOSIS — R609 Edema, unspecified: Secondary | ICD-10-CM

## 2012-09-07 LAB — BASIC METABOLIC PANEL
Creatinine, Ser: 1.03 mg/dL (ref 0.76–1.27)
Potassium: 4.5 mmol/L (ref 3.5–5.2)
Sodium: 142 mmol/L (ref 134–144)

## 2012-09-09 NOTE — Progress Notes (Signed)
lmtcb

## 2012-09-10 NOTE — Progress Notes (Signed)
Pt informed of lab result

## 2012-09-17 ENCOUNTER — Other Ambulatory Visit: Payer: Self-pay

## 2012-09-20 ENCOUNTER — Telehealth: Payer: Self-pay

## 2012-09-20 NOTE — Telephone Encounter (Signed)
Please advise Also wants [place] to know his dyspnea has improved and wonders if eh still needs repeat CXR (see CXR results from 4/25) i will make Dr. Kirke Corin aware and call him back

## 2012-09-20 NOTE — Telephone Encounter (Signed)
He is able to work but should avoid lifting more than 30 labs.  A follow up chest x ray is needed to make sure the fluid is gone.

## 2012-09-20 NOTE — Telephone Encounter (Signed)
Pt states he has gotten a job, but needs a note stating he is able to work .

## 2012-09-20 NOTE — Telephone Encounter (Signed)
Pt informed Understanding verb Letter at [facility] for him to pick up

## 2012-10-01 ENCOUNTER — Telehealth: Payer: Self-pay

## 2012-10-01 NOTE — Telephone Encounter (Signed)
lmtcb re:CXR at [facility]

## 2012-10-09 NOTE — Telephone Encounter (Signed)
Per pt he has not gotten repeat CXR He will get this done this week at [facility]

## 2012-10-14 ENCOUNTER — Telehealth: Payer: Self-pay

## 2012-10-14 NOTE — Telephone Encounter (Signed)
Pt still has not had f/u CXR Will make Dr. Kirke Corin aware

## 2012-10-14 NOTE — Telephone Encounter (Signed)
Ok

## 2012-10-14 NOTE — Telephone Encounter (Signed)
CXR results from [facility]

## 2012-10-14 NOTE — Telephone Encounter (Signed)
FYI (also see multiple telephone calls about this)

## 2012-10-21 ENCOUNTER — Ambulatory Visit: Payer: Self-pay | Admitting: Cardiovascular Disease

## 2012-10-22 ENCOUNTER — Other Ambulatory Visit: Payer: Self-pay

## 2012-10-22 ENCOUNTER — Telehealth: Payer: Self-pay | Admitting: *Deleted

## 2012-10-22 DIAGNOSIS — R609 Edema, unspecified: Secondary | ICD-10-CM

## 2012-10-22 NOTE — Telephone Encounter (Signed)
See below Pt decreased to 81 mg ASA daily

## 2012-10-22 NOTE — Telephone Encounter (Signed)
Pt has questions about cutting back on 81 mg aspirin? Pt mentioned that he is bruising easily just by touch and that when he cuts himself he tends to bleed freely. Please advise.

## 2012-10-22 NOTE — Telephone Encounter (Signed)
That is fine 

## 2012-10-22 NOTE — Telephone Encounter (Signed)
Just to clarify pt has been on the 81 mg once daily for several weeks and is still complaining about bruising/bleeding. Do you want him to continue once qd dose?

## 2013-01-13 ENCOUNTER — Encounter: Payer: Self-pay | Admitting: Otolaryngology

## 2013-01-29 ENCOUNTER — Encounter: Payer: Self-pay | Admitting: Otolaryngology

## 2013-01-29 ENCOUNTER — Ambulatory Visit: Payer: Self-pay | Admitting: Otolaryngology

## 2013-02-17 ENCOUNTER — Ambulatory Visit (INDEPENDENT_AMBULATORY_CARE_PROVIDER_SITE_OTHER): Payer: Medicare Other | Admitting: Cardiovascular Disease

## 2013-02-17 ENCOUNTER — Encounter: Payer: Self-pay | Admitting: Cardiovascular Disease

## 2013-02-17 VITALS — BP 118/82 | HR 58 | Ht 72.0 in | Wt 228.0 lb

## 2013-02-17 DIAGNOSIS — I4892 Unspecified atrial flutter: Secondary | ICD-10-CM

## 2013-02-18 ENCOUNTER — Ambulatory Visit (INDEPENDENT_AMBULATORY_CARE_PROVIDER_SITE_OTHER): Payer: Medicare Other | Admitting: Cardiovascular Disease

## 2013-02-18 ENCOUNTER — Encounter: Payer: Self-pay | Admitting: Cardiovascular Disease

## 2013-02-18 VITALS — BP 145/75 | HR 56 | Ht 72.0 in | Wt 232.0 lb

## 2013-02-18 DIAGNOSIS — E7800 Pure hypercholesterolemia, unspecified: Secondary | ICD-10-CM

## 2013-02-18 DIAGNOSIS — I2510 Atherosclerotic heart disease of native coronary artery without angina pectoris: Secondary | ICD-10-CM

## 2013-02-18 MED ORDER — CARVEDILOL 3.125 MG PO TABS: 3.1250 mg | ORAL_TABLET | Freq: Two times a day (BID) | ORAL | Status: DC

## 2013-02-18 NOTE — Assessment & Plan Note (Signed)
[facility] which showed a total cholesterol of 156, HDL of 65, triglyceride of 147 and an LDL of 62. Continue treatment with atorvastatin.

## 2013-02-18 NOTE — Patient Instructions (Signed)
Stop taking Metoprolol .  Start Carvedilol 3.125 mg twice daily.   Your physician wants you to follow-up in: 6 months.  You will receive a reminder letter in the mail two months in advance. If you don't receive a letter, please call our office to schedule the follow-up appointment.

## 2013-02-18 NOTE — Assessment & Plan Note (Addendum)
Patient is doing very well with no symptoms suggestive of angina or heart failure. He is taking aspirin 3 times a week due to extensive bruising when he was taking daily. Continue current medications. Due to bradycardia, I will change metoprolol to small dose carvedilol 3.125 mg twice daily. I elected not to discontinue treatment with a beta blocker altogether due to frequent PACs.

## 2013-02-18 NOTE — Progress Notes (Signed)
Primary care physician: Dr. Juanetta Gosling.  HPI  This is a 67 year old male who is here today for a followup visit regarding coronary artery disease status post CABG in January of 2014.  He had postoperative atrial fibrillation which was treated with amiodarone. All his anginal symptoms resolved completely.   He has been doing very well and denies any chest pain or dyspnea. He was noted to be more bradycardic recently and thus the dose of metoprolol was decreased. He denies syncope or presyncope. No palpitations.  Allergies  Allergen Reactions  . Iodine Rash  . Penicillins Hives, Itching, Rash and Other (See Comments)    "broke out in welps" (05/06/2012)  . Sulfa Antibiotics Shortness Of Breath  . Iodine     Rash and itching  . Ivp Dye [Iodinated Diagnostic Agents]     Rash;redness  . Penicillins     Hives  . Sulfa Antibiotics     Difficulty Breathing     Current Outpatient Prescriptions on File Prior to Visit  Medication Sig Dispense Refill  . aspirin EC 81 MG tablet Take 1 tablet (81 mg total) by mouth daily.      [name] atorvastatin (LIPITOR) 40 MG tablet Take 1 tablet (40 mg total) by mouth daily.  30 tablet  6  . FREESTYLE LITE test strip USE TO CHECK BLOOD SUGAR 3 TIMES DAILY  100 each  0  . glucose monitoring kit (FREESTYLE) monitoring kit 1 each by Does not apply route as needed for other.  1 each  0  . insulin detemir (LEVEMIR) 100 UNIT/ML injection Inject 21 Units into the skin at bedtime.      . Insulin Pen Needle (NOVOFINE) 30G X 8 MM MISC Inject 10 each into the skin as needed.  100 each  12  . LANCETS ULTRA THIN MISC 1 Device by Does not apply route 4 (four) times daily -  before meals and at bedtime.  200 each  5  . levothyroxine (SYNTHROID, LEVOTHROID) 25 MCG tablet Take 25 mcg by mouth daily.      [name] loratadine (CLARITIN) 10 MG tablet Take 10 mg by mouth daily as needed. For allergies      . metFORMIN (GLUCOPHAGE) 500 MG tablet Take 500 mg by mouth 2 (two) times daily with a  meal.      . metoprolol tartrate (LOPRESSOR) 25 MG tablet Take 0.5 tablets (12.5 mg total) by mouth 2 (two) times daily.  60 tablet  6   No current facility-administered medications on file prior to visit.     Past Medical History  Diagnosis Date  . Diabetes mellitus without complication   . Thyroid disease   . 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 History of bronchitis ~ 01/2012  . Atypical angina 05/06/2012  . Exertional dyspnea     "for the last few months" (05/06/2012)  . Type II diabetes mellitus 05/06/2012    "just found out; haven't had 2 doses of the RX yet" ((05/06/2012)  . Hypothyroidism   . Arthritis     "right knee" (05/06/2012)  . Coronary artery disease     Angina with highly abnormal stress test in October of 2013. Cardiac catheterization showed severe three-vessel coronary artery disease. He underwent CABG by Dr. Nydia Bouton in October of 2013 with LIMA to LAD, SVG to left circumflex and SVG to right PDA  . Hypercholesteremia 03/2012     Past Surgical History  Procedure Laterality Date  .  Knee surgery  2003    right  . Neck surgery  2008    Plate in neck  . Knee arthroscopy  ~ 2000    "right" (05/06/2012)  . Anterior cervical decomp/discectomy fusion  ~ 2009  . Intraoperative transesophageal echocardiogram  05/07/2012    Procedure: INTRAOPERATIVE TRANSESOPHAGEAL ECHOCARDIOGRAM;  Surgeon: Delight Ovens, MD;  Location: [facility] OR;  Service: Open Heart Surgery;  Laterality: N/A;  . Coronary artery bypass graft  05/07/2012    Procedure: CORONARY ARTERY BYPASS GRAFTING (CABG);  Surgeon: Delight Ovens, MD;  Location: [facility] OR;  Service: Open Heart Surgery;  Laterality: N/A;  times three  . Cardiac catheterization  05/06/2012    Significant three-vessel coronary artery disease with normal ejection fraction     Family History  Problem Relation Age of Onset  . Heart disease Mother   . Clotting disorder Mother   . Hypertension Sister      History    Social History  . Marital Status: Married    Spouse Name: N/A    Number of Children: N/A  . Years of Education: N/A   Occupational History  . Not on file.   Social History Main Topics  . Smoking status: Former Smoker -- 1.00 packs/day for 8 years    Types: Cigarettes    Quit date: 12/31/1967  . Smokeless tobacco: Never Used  . Alcohol Use: Yes     Comment: 05/06/2012 "last alcohol several years ago; never had problem wit"  . Drug Use: No  . Sexual Activity: Not Currently   Other Topics Concern  . Not on file   Social History Narrative   ** Merged History Encounter **            PHYSICAL EXAM   There were no vitals taken for this visit.  Constitutional: He is oriented to person, place, and time. He appears well-developed and well-nourished. No distress.  HENT: No nasal discharge.  Head: Normocephalic and atraumatic.  Eyes: Pupils are equal and round. Right eye exhibits no discharge. Left eye exhibits no discharge.  Neck: Normal range of motion. Neck supple. No JVD present. No thyromegaly present.  Cardiovascular: Bradycardic with premature beats, regular rhythm, normal heart sounds and. Exam reveals no gallop and no friction rub. No murmur heard.  Pulmonary/Chest: Effort normal and breath sounds normal. No stridor. No respiratory distress. He has no wheezes. He has no rales. He exhibits no tenderness.  Abdominal: Soft. Bowel sounds are normal. He exhibits no distension. There is no tenderness. There is no rebound and no guarding.  Musculoskeletal: Normal range of motion. He exhibits no edema and no tenderness.  Neurological: He is alert and oriented to person, place, and time. Coordination normal.  Skin: Skin is warm and dry. No rash noted. He is not diaphoretic. No erythema. No pallor.  Psychiatric: He has a normal mood and affect. His behavior is normal. Judgment and thought content normal.      EKG: Sinus  Bradycardia  - frequent PAC s  # PACs = 3. -Old  anterior infarct.   -Nonspecific ST depression   +   T-abnormality  -Possible  Anterolateral and inferior  ischemia.   ABNORMAL   ASSESSMENT AND PLAN

## 2013-02-19 NOTE — Progress Notes (Signed)
Patient ID: Philip [name], male   DOB: 12-13-1945, 67 y.o.   MRN: [id number] The visit was rescheduled to next day.

## 2013-06-10 ENCOUNTER — Ambulatory Visit (INDEPENDENT_AMBULATORY_CARE_PROVIDER_SITE_OTHER): Payer: Medicare Other | Admitting: Cardiovascular Disease

## 2013-06-10 ENCOUNTER — Encounter: Payer: Self-pay | Admitting: Cardiovascular Disease

## 2013-06-10 VITALS — BP 131/77 | HR 77 | Ht 71.0 in | Wt 233.5 lb

## 2013-06-10 DIAGNOSIS — R9431 Abnormal electrocardiogram [ECG] [EKG]: Secondary | ICD-10-CM

## 2013-06-10 DIAGNOSIS — Z951 Presence of aortocoronary bypass graft: Secondary | ICD-10-CM

## 2013-06-10 DIAGNOSIS — E7800 Pure hypercholesterolemia, unspecified: Secondary | ICD-10-CM

## 2013-06-10 DIAGNOSIS — I491 Atrial premature depolarization: Secondary | ICD-10-CM | POA: Insufficient documentation

## 2013-06-10 NOTE — Assessment & Plan Note (Signed)
Continue treatment with atorvastatin with a target LDL of less than 70. 

## 2013-06-10 NOTE — Patient Instructions (Signed)
Change Aspirin to 81 mg once daily.   Your physician wants you to follow-up in: 6 months.  You will receive a reminder letter in the mail two months in advance. If you don't receive a letter, please call our office to schedule the follow-up appointment.  Change previous recall.

## 2013-06-10 NOTE — Assessment & Plan Note (Signed)
The patient has significant sinus arrhythmia and premature atrial beats without evidence of atrial fibrillation. He is overall asymptomatic and thus I recommend continuing treatment with carvedilol and monitoring his symptoms. In the absence of documented atrial fibrillation, there is no indication for anticoagulation.

## 2013-06-10 NOTE — Assessment & Plan Note (Signed)
He is doing very well with no symptoms suggestive of recurrent angina. Continue medical therapy. I asked him to resume aspirin 81 mg once daily instead of 3 times a week. He cut down the dose due to nosebleed.

## 2013-06-10 NOTE — Progress Notes (Signed)
Primary care physician: Dr. Luan Pulling.  HPI  This is a 68 year old male who is here today for a followup visit regarding coronary artery disease status post CABG in January of 2014.  He had postoperative atrial fibrillation which was treated with amiodarone. All his anginal symptoms resolved completely.   He has been doing very well and denies any chest pain or dyspnea. He was noted to be bradycardic during most recent visit. Thus, I switched him from metoprolol to carvedilol. He was seen by Dr. Luan Pulling recently and was noted to have irregular heartbeats. He had an EKG done which was reviewed by me. It showed sinus rhythm with frequent PACs. No atrial fibrillation is noted as there was clear P waves on the tracing. He is known to have history of PACs. He denies palpitations, dizziness or syncope.  Allergies  Allergen Reactions  . Iodine Rash  . Penicillins Hives, Itching, Rash and Other (See Comments)    "broke out in welps" (05/06/2012)  . Sulfa Antibiotics Shortness Of Breath  . Iodine     Rash and itching  . Ivp Dye [Iodinated Diagnostic Agents]     Rash;redness  . Penicillins     Hives  . Sulfa Antibiotics     Difficulty Breathing     Current Outpatient Prescriptions on File Prior to Visit  Medication Sig Dispense Refill  . aspirin EC 81 MG tablet Take 81 mg by mouth 3 (three) times a week.       [name] atorvastatin (LIPITOR) 40 MG tablet Take 1 tablet (40 mg total) by mouth daily.  30 tablet  6  . carvedilol (COREG) 3.125 MG tablet Take 1 tablet (3.125 mg total) by mouth 2 (two) times daily.  60 tablet  6  . FREESTYLE LITE test strip USE TO CHECK BLOOD SUGAR 3 TIMES DAILY  100 each  0  . glucose monitoring kit (FREESTYLE) monitoring kit 1 each by Does not apply route as needed for other.  1 each  0  . insulin detemir (LEVEMIR) 100 UNIT/ML injection Inject 23 Units into the skin at bedtime.       . Insulin Pen Needle (NOVOFINE) 30G X 8 MM MISC Inject 10 each into the skin as needed.   100 each  12  . LANCETS ULTRA THIN MISC 1 Device by Does not apply route 4 (four) times daily -  before meals and at bedtime.  200 each  5  . levothyroxine (SYNTHROID, LEVOTHROID) 25 MCG tablet Take 25 mcg by mouth daily.      [name] loratadine (CLARITIN) 10 MG tablet Take 10 mg by mouth daily as needed. For allergies      . metFORMIN (GLUCOPHAGE) 500 MG tablet Take 500 mg by mouth 2 (two) times daily with a meal.       No current facility-administered medications on file prior to visit.     Past Medical History  Diagnosis Date  . Diabetes mellitus without complication   . Thyroid disease   . 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 History of bronchitis ~ 01/2012  . Atypical angina 05/06/2012  . Exertional dyspnea     "for the last few months" (05/06/2012)  . Type II diabetes mellitus 05/06/2012    "just found out; haven't had 2 doses of the RX yet" ((05/06/2012)  . Hypothyroidism   . Arthritis     "right knee" (05/06/2012)  . Coronary artery disease     Angina  with highly abnormal stress test in October of 2013. Cardiac catheterization showed severe three-vessel coronary artery disease. He underwent CABG by Dr. Pia Mau in October of 2013 with LIMA to LAD, SVG to left circumflex and SVG to right PDA  . Hypercholesteremia 03/2012     Past Surgical History  Procedure Laterality Date  . Knee surgery  2003    right  . Neck surgery  2008    Plate in neck  . Knee arthroscopy  ~ 2000    "right" (05/06/2012)  . Anterior cervical decomp/discectomy fusion  ~ 2009  . Intraoperative transesophageal echocardiogram  05/07/2012    Procedure: INTRAOPERATIVE TRANSESOPHAGEAL ECHOCARDIOGRAM;  Surgeon: [name], MD;  Location: [facility];  Service: Open Heart Surgery;  Laterality: N/A;  . Coronary artery bypass graft  05/07/2012    Procedure: CORONARY ARTERY BYPASS GRAFTING (CABG);  Surgeon: [name], MD;  Location: [place];  Service: Open Heart Surgery;  Laterality: N/A;  times  three  . Cardiac catheterization  05/06/2012    Significant three-vessel coronary artery disease with normal ejection fraction     Family History  Problem Relation Age of Onset  . Heart disease Mother   . Clotting disorder Mother   . Hypertension Sister      History   Social History  . Marital Status: Married    Spouse Name: N/A    Number of Children: N/A  . Years of Education: N/A   Occupational History  . Not on file.   Social History Main Topics  . Smoking status: Former Smoker -- 1.00 packs/day for 8 years    Types: Cigarettes    Quit date: 12/31/1967  . Smokeless tobacco: Never Used  . Alcohol Use: Yes     Comment: 05/06/2012 "last alcohol several years ago; never had problem wit"  . Drug Use: No  . Sexual Activity: Not Currently   Other Topics Concern  . Not on file   Social History Narrative   ** Merged History Encounter **            PHYSICAL EXAM   BP 131/77  Pulse 77  Ht _0  (1.803 m)  Wt 233 lb 8 oz (105.915 kg)  BMI 32.58 kg/m2  Constitutional: He is oriented to person, place, and time. He appears well-developed and well-nourished. No distress.  HENT: No nasal discharge.  Head: Normocephalic and atraumatic.  Eyes: Pupils are equal and round. Right eye exhibits no discharge. Left eye exhibits no discharge.  Neck: Normal range of motion. Neck supple. No JVD present. No thyromegaly present.  Cardiovascular: Normal rate with premature beats, regular rhythm, normal heart sounds and. Exam reveals no gallop and no friction rub. No murmur heard.  Pulmonary/Chest: Effort normal and breath sounds normal. No stridor. No respiratory distress. He has no wheezes. He has no rales. He exhibits no tenderness.  Abdominal: Soft. Bowel sounds are normal. He exhibits no distension. There is no tenderness. There is no rebound and no guarding.  Musculoskeletal: Normal range of motion. He exhibits no edema and no tenderness.  Neurological: He is alert and oriented  to person, place, and time. Coordination normal.  Skin: Skin is warm and dry. No rash noted. He is not diaphoretic. No erythema. No pallor.  Psychiatric: He has a normal mood and affect. His behavior is normal. Judgment and thought content normal.      EKG: Sinus rhythm with marked sinus arrhythmia and PACs. Nonspecific T wave changes.  ASSESSMENT AND PLAN

## 2013-06-25 ENCOUNTER — Other Ambulatory Visit: Payer: Self-pay | Admitting: Physician Assistant

## 2013-07-01 ENCOUNTER — Other Ambulatory Visit: Payer: Self-pay | Admitting: Physician Assistant

## 2013-07-24 ENCOUNTER — Other Ambulatory Visit: Payer: Self-pay | Admitting: Cardiovascular Disease

## 2013-09-05 ENCOUNTER — Other Ambulatory Visit: Payer: Self-pay

## 2013-09-05 ENCOUNTER — Telehealth: Payer: Self-pay | Admitting: *Deleted

## 2013-09-05 MED ORDER — FUROSEMIDE 20 MG PO TABS: 20.0000 mg | ORAL_TABLET | Freq: Every day | ORAL | Status: DC

## 2013-09-05 NOTE — Telephone Encounter (Signed)
Furosemide  20mg

## 2013-09-05 NOTE — Telephone Encounter (Signed)
Refill sent for new Rx per Dr. Fletcher Anon for Furosemide 20 mg take one tablet daily.

## 2013-09-09 ENCOUNTER — Encounter: Payer: Self-pay | Admitting: *Deleted

## 2013-09-09 ENCOUNTER — Ambulatory Visit (INDEPENDENT_AMBULATORY_CARE_PROVIDER_SITE_OTHER): Payer: Medicare Other | Admitting: Cardiovascular Disease

## 2013-09-09 ENCOUNTER — Encounter: Payer: Self-pay | Admitting: Cardiovascular Disease

## 2013-09-09 VITALS — BP 150/80 | HR 92 | Ht 71.0 in | Wt 233.8 lb

## 2013-09-09 DIAGNOSIS — R0600 Dyspnea, unspecified: Secondary | ICD-10-CM

## 2013-09-09 DIAGNOSIS — R0989 Other specified symptoms and signs involving the circulatory and respiratory systems: Secondary | ICD-10-CM

## 2013-09-09 DIAGNOSIS — R002 Palpitations: Secondary | ICD-10-CM

## 2013-09-09 DIAGNOSIS — I491 Atrial premature depolarization: Secondary | ICD-10-CM

## 2013-09-09 DIAGNOSIS — E7800 Pure hypercholesterolemia, unspecified: Secondary | ICD-10-CM

## 2013-09-09 DIAGNOSIS — R079 Chest pain, unspecified: Secondary | ICD-10-CM

## 2013-09-09 DIAGNOSIS — R0602 Shortness of breath: Secondary | ICD-10-CM

## 2013-09-09 DIAGNOSIS — I2510 Atherosclerotic heart disease of native coronary artery without angina pectoris: Secondary | ICD-10-CM

## 2013-09-09 DIAGNOSIS — R0609 Other forms of dyspnea: Secondary | ICD-10-CM

## 2013-09-09 MED ORDER — METOPROLOL TARTRATE 25 MG PO TABS: 25.0000 mg | ORAL_TABLET | Freq: Two times a day (BID) | ORAL | Status: DC

## 2013-09-09 NOTE — Assessment & Plan Note (Signed)
The patient had a short run of a regular narrow complex tachycardia at a rate of 150 beats per minute which could be due to atrial flutter with 2 to one block versus atrial tachycardia. Symptoms include presyncope, shortness of breath and mild palpitations. I will check routine labs today including TSH. On switching him back from carvedilol to metoprolol 25 mg twice daily. I requested a 48-hour Holter monitor. Given associated dyspnea, I requested an echocardiogram as well.

## 2013-09-09 NOTE — Progress Notes (Signed)
Primary care physician: Dr. Luan Pulling.  HPI  This is a 68 year old male who was added to my schedule for evaluation of dizziness, palpitations and presyncope. He has known history of coronary artery disease status post CABG in January of 2014.  He had postoperative atrial fibrillation which was treated with amiodarone. All his anginal symptoms resolved completely.    He is known to have frequent PACs. Today while at work, he started having dizziness with mild palpitations and shortness of breath. He had presyncopal episode but no frank loss of consciousness. He was seen by Dr. Burt Ek there. An EKG showed sinus rhythm with frequent PACs. There was a short rhythm strip which showed a regular narrow complex tachycardia at a rate of 150 beats per minute. He denies any chest discomfort. He feels better now.    Allergies  Allergen Reactions  . Iodine Rash  . Penicillins Hives, Itching, Rash and Other (See Comments)    "broke out in welps" (05/06/2012)  . Sulfa Antibiotics Shortness Of Breath  . Iodine     Rash and itching  . Ivp Dye [Iodinated Diagnostic Agents]     Rash;redness  . Penicillins     Hives  . Sulfa Antibiotics     Difficulty Breathing     Current Outpatient Prescriptions on File Prior to Visit  Medication Sig Dispense Refill  . aspirin EC 81 MG tablet Take 81 mg by mouth 3 (three) times a week.       [name] atorvastatin (LIPITOR) 40 MG tablet TAKE ONE TABLET BY MOUTH ONCE DAILY  30 tablet  6  . carvedilol (COREG) 3.125 MG tablet Take 1 tablet (3.125 mg total) by mouth 2 (two) times daily.  60 tablet  6  . FREESTYLE LITE test strip USE TO CHECK BLOOD SUGAR 3 TIMES DAILY  100 each  0  . furosemide (LASIX) 20 MG tablet Take 1 tablet (20 mg total) by mouth daily.  90 tablet  3  . glucose monitoring kit (FREESTYLE) monitoring kit 1 each by Does not apply route as needed for other.  1 each  0  . insulin detemir (LEVEMIR) 100 UNIT/ML injection Inject 25 Units into the skin at  bedtime.       . Insulin Pen Needle (NOVOFINE) 30G X 8 MM MISC Inject 10 each into the skin as needed.  100 each  12  . LANCETS ULTRA THIN MISC 1 Device by Does not apply route 4 (four) times daily -  before meals and at bedtime.  200 each  5  . levothyroxine (SYNTHROID, LEVOTHROID) 25 MCG tablet Take 25 mcg by mouth daily.      [name] loratadine (CLARITIN) 10 MG tablet Take 10 mg by mouth daily as needed. For allergies      . metFORMIN (GLUCOPHAGE) 500 MG tablet Take 500 mg by mouth 2 (two) times daily with a meal.      . RELION SHORT PEN NEEDLES 31G X 8 MM MISC USE AS DIRECTED AS NEEDED  100 each  0   No current facility-administered medications on file prior to visit.     Past Medical History  Diagnosis Date  . Diabetes mellitus without complication   . Thyroid disease   . 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 History of bronchitis ~ 01/2012  . Atypical angina 05/06/2012  . Exertional dyspnea     "for the last few months" (05/06/2012)  . Type II diabetes mellitus 05/06/2012    "  just found out; haven't had 2 doses of the RX yet" ((05/06/2012)  . Hypothyroidism   . Arthritis     "right knee" (05/06/2012)  . Coronary artery disease     Angina with highly abnormal stress test in October of 2013. Cardiac catheterization showed severe three-vessel coronary artery disease. He underwent CABG by Dr. Pia Mau in October of 2013 with LIMA to LAD, SVG to left circumflex and SVG to right PDA  . Hypercholesteremia 03/2012     Past Surgical History  Procedure Laterality Date  . Knee surgery  2003    right  . Neck surgery  2008    Plate in neck  . Knee arthroscopy  ~ 2000    "right" (05/06/2012)  . Anterior cervical decomp/discectomy fusion  ~ 2009  . Intraoperative transesophageal echocardiogram  05/07/2012    Procedure: INTRAOPERATIVE TRANSESOPHAGEAL ECHOCARDIOGRAM;  Surgeon: [name], MD;  Location: [place];  Service: Open Heart Surgery;  Laterality: N/A;  . Coronary  artery bypass graft  05/07/2012    Procedure: CORONARY ARTERY BYPASS GRAFTING (CABG);  Surgeon: [name], MD;  Location: [facility];  Service: Open Heart Surgery;  Laterality: N/A;  times three  . Cardiac catheterization  05/06/2012    Significant three-vessel coronary artery disease with normal ejection fraction     Family History  Problem Relation Age of Onset  . Heart disease Mother   . Clotting disorder Mother   . Hypertension Sister      History   Social History  . Marital Status: Married    Spouse Name: N/A    Number of Children: N/A  . Years of Education: N/A   Occupational History  . Not on file.   Social History Main Topics  . Smoking status: Former Smoker -- 1.00 packs/day for 8 years    Types: Cigarettes    Quit date: 12/31/1967  . Smokeless tobacco: Never Used  . Alcohol Use: Yes     Comment: 05/06/2012 "last alcohol several years ago; never had problem wit"  . Drug Use: No  . Sexual Activity: Not Currently   Other Topics Concern  . Not on file   Social History Narrative   ** Merged History Encounter **            PHYSICAL EXAM   BP 150/80  Pulse 92  Ht _0  (1.803 m)  Wt 233 lb 12 oz (106.028 kg)  BMI 32.62 kg/m2  Constitutional: He is oriented to person, place, and time. He appears well-developed and well-nourished. No distress.  HENT: No nasal discharge.  Head: Normocephalic and atraumatic.  Eyes: Pupils are equal and round. Right eye exhibits no discharge. Left eye exhibits no discharge.  Neck: Normal range of motion. Neck supple. No JVD present. No thyromegaly present.  Cardiovascular: Normal rate with premature beats, regular rhythm, normal heart sounds and. Exam reveals no gallop and no friction rub. No murmur heard.  Pulmonary/Chest: Effort normal and breath sounds normal. No stridor. No respiratory distress. He has no wheezes. He has no rales. He exhibits no tenderness.  Abdominal: Soft. Bowel sounds are normal. He exhibits no  distension. There is no tenderness. There is no rebound and no guarding.  Musculoskeletal: Normal range of motion. He exhibits no edema and no tenderness.  Neurological: He is alert and oriented to person, place, and time. Coordination normal.  Skin: Skin is warm and dry. No rash noted. He is not diaphoretic. No erythema. No pallor.  Psychiatric: He has a  normal mood and affect. His behavior is normal. Judgment and thought content normal.      EKG: Sinus rhythm with marked sinus arrhythmia and PACs. Nonspecific T wave changes.  ASSESSMENT AND PLAN

## 2013-09-09 NOTE — Patient Instructions (Addendum)
Your physician has recommended you make the following change in your medication:  Stop Carvedilol  Start Metoprolol 25 mg twice daily   Your physician has requested that you have an echocardiogram. Echocardiography is a painless test that uses sound waves to create images of your heart. It provides your doctor with information about the size and shape of your heart and how well your heart's chambers and valves are working. This procedure takes approximately one hour. There are no restrictions for this procedure.  Your physician has recommended that you wear a 48 holter monitor. Holter monitors are medical devices that record the heart's electrical activity. Doctors most often use these monitors to diagnose arrhythmias. Arrhythmias are problems with the speed or rhythm of the heartbeat. The monitor is a small, portable device. You can wear one while you do your normal daily activities. This is usually used to diagnose what is causing palpitations/syncope (passing out).  Your physician recommends that you have have labs today: TSH  CBC  BMP  Follow up after test

## 2013-09-09 NOTE — Assessment & Plan Note (Signed)
Continue treatment with atorvastatin with a target LDL of less than 70. 

## 2013-09-09 NOTE — Assessment & Plan Note (Signed)
He has no symptoms suggestive of angina. Current symptoms seems to be related to arrhythmia. Continue medical therapy.

## 2013-09-10 LAB — CBC WITH DIFFERENTIAL
BASOS ABS: 0 10*3/uL (ref 0.0–0.2)
Basos: 1 %
EOS: 3 %
Eosinophils Absolute: 0.2 10*3/uL (ref 0.0–0.4)
HCT: 42.1 % (ref 37.5–51.0)
Hemoglobin: 14.5 g/dL (ref 12.6–17.7)
IMMATURE GRANS (ABS): 0 10*3/uL (ref 0.0–0.1)
IMMATURE GRANULOCYTES: 0 %
LYMPHS ABS: 1.4 10*3/uL (ref 0.7–3.1)
LYMPHS: 21 %
MCH: 30 pg (ref 26.6–33.0)
MCHC: 34.4 g/dL (ref 31.5–35.7)
MCV: 87 fL (ref 79–97)
MONOCYTES: 8 %
Monocytes Absolute: 0.6 10*3/uL (ref 0.1–0.9)
NEUTROS PCT: 67 %
Neutrophils Absolute: 4.5 10*3/uL (ref 1.4–7.0)
Platelets: 201 10*3/uL (ref 150–379)
RBC: 4.84 x10E6/uL (ref 4.14–5.80)
RDW: 14.4 % (ref 12.3–15.4)
WBC: 6.7 10*3/uL (ref 3.4–10.8)

## 2013-09-10 LAB — BASIC METABOLIC PANEL
BUN/Creatinine Ratio: 13 (ref 10–22)
BUN: 17 mg/dL (ref 8–27)
CHLORIDE: 102 mmol/L (ref 97–108)
CO2: 22 mmol/L (ref 18–29)
CREATININE: 1.34 mg/dL — AB (ref 0.76–1.27)
Calcium: 9 mg/dL (ref 8.6–10.2)
GFR calc Af Amer: 63 mL/min/{1.73_m2} (ref >59)
GFR calc non Af Amer: 55 mL/min/{1.73_m2} — ABNORMAL LOW (ref >59)
GLUCOSE: 193 mg/dL — AB (ref 65–99)
Potassium: 4.2 mmol/L (ref 3.5–5.2)
Sodium: 145 mmol/L — ABNORMAL HIGH (ref 134–144)

## 2013-09-10 LAB — TSH: TSH: 3.03 u[IU]/mL (ref 0.450–4.500)

## 2013-09-16 ENCOUNTER — Telehealth: Payer: Self-pay | Admitting: *Deleted

## 2013-09-16 ENCOUNTER — Ambulatory Visit (INDEPENDENT_AMBULATORY_CARE_PROVIDER_SITE_OTHER): Payer: Medicare Other | Admitting: *Deleted

## 2013-09-16 DIAGNOSIS — I2510 Atherosclerotic heart disease of native coronary artery without angina pectoris: Secondary | ICD-10-CM

## 2013-09-16 DIAGNOSIS — R002 Palpitations: Secondary | ICD-10-CM

## 2013-09-16 DIAGNOSIS — R0600 Dyspnea, unspecified: Secondary | ICD-10-CM

## 2013-09-16 DIAGNOSIS — R079 Chest pain, unspecified: Secondary | ICD-10-CM

## 2013-09-16 DIAGNOSIS — R0602 Shortness of breath: Secondary | ICD-10-CM

## 2013-09-16 MED ORDER — CARVEDILOL 3.125 MG PO TABS: 3.1250 mg | ORAL_TABLET | Freq: Two times a day (BID) | ORAL | Status: DC

## 2013-09-16 NOTE — Patient Instructions (Signed)
Per Dr.Arida  Stop Metoprolol (possible cause of rash)  Restart Coreg 3.125 mg twice daily

## 2013-09-16 NOTE — Telephone Encounter (Signed)
Pt was in office today for echo  He stated that he has rash on right leg  He stated it started right after starting Metoprolol  He stated he also started a topical cream for pain around this time  Dr. Fletcher Anon examined the rash  Instructed patient to hold topical cream  He also stated that patient can continue Metoprolol or switch back to Coreg depending on if he thought the Metoprolol was causing the rash  Instructed patient to call and let us know which medication he decides to stay on  Instructed patient to seek emergency services if his situation becomes emergent

## 2013-09-23 ENCOUNTER — Ambulatory Visit (INDEPENDENT_AMBULATORY_CARE_PROVIDER_SITE_OTHER): Payer: Medicare Other | Admitting: Cardiovascular Disease

## 2013-09-23 ENCOUNTER — Encounter: Payer: Self-pay | Admitting: Cardiovascular Disease

## 2013-09-23 VITALS — BP 150/90 | HR 63 | Ht 71.0 in | Wt 231.5 lb

## 2013-09-23 DIAGNOSIS — E7800 Pure hypercholesterolemia, unspecified: Secondary | ICD-10-CM

## 2013-09-23 DIAGNOSIS — I2510 Atherosclerotic heart disease of native coronary artery without angina pectoris: Secondary | ICD-10-CM

## 2013-09-23 DIAGNOSIS — I471 Supraventricular tachycardia: Secondary | ICD-10-CM

## 2013-09-23 MED ORDER — CARVEDILOL 6.25 MG PO TABS: 6.2500 mg | ORAL_TABLET | Freq: Two times a day (BID) | ORAL | Status: DC

## 2013-09-23 NOTE — Patient Instructions (Signed)
Your physician has recommended you make the following change in your medication:  Increase Carvedilol to 6.25 mg twice daily  Your physician recommends that you schedule a follow-up appointment in:  3 months    You can resume work tomorrow with no restrictions

## 2013-09-23 NOTE — Progress Notes (Signed)
Primary care physician: Dr. Luan Pulling.  HPI  This is a 68 year old male who is here today for followup visit regarding tachycardia.  He has known history of coronary artery disease status post CABG in January of 2014.  He had postoperative atrial fibrillation which was treated with amiodarone. All his anginal symptoms resolved completely.    He is known to have frequent PACs. He was seen recently for  dizziness with mild palpitations and shortness of breath. He had presyncopal episode but no frank loss of consciousness. EKG showed sinus rhythm with frequent PACs. There was a short rhythm strip which showed a regular narrow complex tachycardia at a rate of 150 beats per minute.  During last visit, I switched him from carvedilol to metoprolol 25 mg twice daily. An echocardiogram showed low normal LV systolic function with ejection fraction of 50-55% with mild mitral regurgitation. A Holter monitor showed sinus rhythm with frequent PACs and runs of SVT. There was no evidence of atrial fibrillation. He reported rash after taking metoprolol and went back to taking carvedilol according to him. The rash on the right leg turned out to be due to shingles. Labs showed normal CBC and thyroid function. Creatinine was above his baseline with possible volume depletion. Lasix was stopped. Overall he is feeling better with less palpitations.   Allergies  Allergen Reactions  . Iodine Rash  . Penicillins Hives, Itching, Rash and Other (See Comments)    "broke out in welps" (05/06/2012)  . Sulfa Antibiotics Shortness Of Breath  . Iodine     Rash and itching  . Ivp Dye [Iodinated Diagnostic Agents]     Rash;redness  . Penicillins     Hives  . Sulfa Antibiotics     Difficulty Breathing     Current Outpatient Prescriptions on File Prior to Visit  Medication Sig Dispense Refill  . aspirin EC 81 MG tablet Take 81 mg by mouth 3 (three) times a week.       [name] atorvastatin (LIPITOR) 40 MG tablet TAKE ONE  TABLET BY MOUTH ONCE DAILY  30 tablet  6  . carvedilol (COREG) 3.125 MG tablet Take 1 tablet (3.125 mg total) by mouth 2 (two) times daily.  60 tablet  6  . FREESTYLE LITE test strip USE TO CHECK BLOOD SUGAR 3 TIMES DAILY  100 each  0  . glucose monitoring kit (FREESTYLE) monitoring kit 1 each by Does not apply route as needed for other.  1 each  0  . insulin detemir (LEVEMIR) 100 UNIT/ML injection Inject 25 Units into the skin at bedtime.       . Insulin Pen Needle (NOVOFINE) 30G X 8 MM MISC Inject 10 each into the skin as needed.  100 each  12  . LANCETS ULTRA THIN MISC 1 Device by Does not apply route 4 (four) times daily -  before meals and at bedtime.  200 each  5  . levothyroxine (SYNTHROID, LEVOTHROID) 25 MCG tablet Take 25 mcg by mouth daily.      [name] loratadine (CLARITIN) 10 MG tablet Take 10 mg by mouth daily as needed. For allergies      . metFORMIN (GLUCOPHAGE) 500 MG tablet Take 500 mg by mouth 2 (two) times daily with a meal.      . RELION SHORT PEN NEEDLES 31G X 8 MM MISC USE AS DIRECTED AS NEEDED  100 each  0   No current facility-administered medications on file prior to visit.     Past  Medical History  Diagnosis Date  . Diabetes mellitus without complication   . Thyroid disease   . 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 History of bronchitis ~ 01/2012  . Atypical angina 05/06/2012  . Exertional dyspnea     "for the last few months" (05/06/2012)  . Type II diabetes mellitus 05/06/2012    "just found out; haven't had 2 doses of the RX yet" ((05/06/2012)  . Hypothyroidism   . Arthritis     "right knee" (05/06/2012)  . Coronary artery disease     Angina with highly abnormal stress test in October of 2013. Cardiac catheterization showed severe three-vessel coronary artery disease. He underwent CABG by Dr. Pia Mau in October of 2013 with LIMA to LAD, SVG to left circumflex and SVG to right PDA  . Hypercholesteremia 03/2012  . Shingles      Past Surgical  History  Procedure Laterality Date  . Knee surgery  2003    right  . Neck surgery  2008    Plate in neck  . Knee arthroscopy  ~ 2000    "right" (05/06/2012)  . Anterior cervical decomp/discectomy fusion  ~ 2009  . Intraoperative transesophageal echocardiogram  05/07/2012    Procedure: INTRAOPERATIVE TRANSESOPHAGEAL ECHOCARDIOGRAM;  Surgeon: [name], MD;  Location: [place];  Service: Open Heart Surgery;  Laterality: N/A;  . Coronary artery bypass graft  05/07/2012    Procedure: CORONARY ARTERY BYPASS GRAFTING (CABG);  Surgeon: [name], MD;  Location: [place];  Service: Open Heart Surgery;  Laterality: N/A;  times three  . Cardiac catheterization  05/06/2012    Significant three-vessel coronary artery disease with normal ejection fraction     Family History  Problem Relation Age of Onset  . Heart disease Mother   . Clotting disorder Mother   . Hypertension Sister      History   Social History  . Marital Status: Married    Spouse Name: N/A    Number of Children: N/A  . Years of Education: N/A   Occupational History  . Not on file.   Social History Main Topics  . Smoking status: Former Smoker -- 1.00 packs/day for 8 years    Types: Cigarettes    Quit date: 12/31/1967  . Smokeless tobacco: Never Used  . Alcohol Use: Yes     Comment: 05/06/2012 "last alcohol several years ago; never had problem wit"  . Drug Use: No  . Sexual Activity: Not Currently   Other Topics Concern  . Not on file   Social History Narrative   ** Merged History Encounter **            PHYSICAL EXAM   BP 150/90  Pulse 63  Ht 5' 11"  (1.803 m)  Wt 231 lb 8 oz (105.008 kg)  BMI 32.30 kg/m2  Constitutional: He is oriented to person, place, and time. He appears well-developed and well-nourished. No distress.  HENT: No nasal discharge.  Head: Normocephalic and atraumatic.  Eyes: Pupils are equal and round. Right eye exhibits no discharge. Left eye exhibits no discharge.  Neck:  Normal range of motion. Neck supple. No JVD present. No thyromegaly present.  Cardiovascular: Normal rate with premature beats, regular rhythm, normal heart sounds and. Exam reveals no gallop and no friction rub. No murmur heard.  Pulmonary/Chest: Effort normal and breath sounds normal. No stridor. No respiratory distress. He has no wheezes. He has no rales. He exhibits no tenderness.  Abdominal: Soft. Bowel sounds are normal. He exhibits no distension. There is no tenderness. There is no rebound and no guarding.  Musculoskeletal: Normal range of motion. He exhibits no edema and no tenderness.  Neurological: He is alert and oriented to person, place, and time. Coordination normal.  Skin: Skin is warm and dry. No rash noted. He is not diaphoretic. No erythema. No pallor.  Psychiatric: He has a normal mood and affect. His behavior is normal. Judgment and thought content normal.      EKG: Sinus rhythm with marked sinus arrhythmia and PACs. Nonspecific T wave changes.  ASSESSMENT AND PLAN

## 2013-09-27 ENCOUNTER — Encounter: Payer: Self-pay | Admitting: Cardiovascular Disease

## 2013-09-27 DIAGNOSIS — I471 Supraventricular tachycardia: Secondary | ICD-10-CM | POA: Insufficient documentation

## 2013-09-27 NOTE — Assessment & Plan Note (Signed)
Holter monitor showed evidence of supraventricular tachycardia likely atrial tachycardia. He is known to have frequent PACs as well. No evidence of atrial fibrillation. I asked him to increase the dose of carvedilol to 6.25 mg twice daily. It is not clear if he is actually taking carvedilol or metoprolol. I asked him to check his medications at home and let us know. If he continues to have frequent episodes, an antiarrhythmic medication might be needed.

## 2013-09-27 NOTE — Assessment & Plan Note (Signed)
Continue treatment with atorvastatin with a target LDL of less than 70. 

## 2013-09-27 NOTE — Assessment & Plan Note (Signed)
He has no symptoms of angina. Continue medical therapy. 

## 2013-09-30 ENCOUNTER — Ambulatory Visit (INDEPENDENT_AMBULATORY_CARE_PROVIDER_SITE_OTHER): Payer: Medicare Other

## 2013-09-30 ENCOUNTER — Other Ambulatory Visit: Payer: Self-pay

## 2013-09-30 DIAGNOSIS — I471 Supraventricular tachycardia: Secondary | ICD-10-CM

## 2013-09-30 DIAGNOSIS — R002 Palpitations: Secondary | ICD-10-CM

## 2013-11-21 ENCOUNTER — Ambulatory Visit: Payer: Self-pay | Admitting: Unknown Physician Specialty

## 2013-11-24 LAB — PATHOLOGY REPORT

## 2013-12-25 ENCOUNTER — Ambulatory Visit (INDEPENDENT_AMBULATORY_CARE_PROVIDER_SITE_OTHER): Payer: Medicare Other | Admitting: Cardiovascular Disease

## 2013-12-25 ENCOUNTER — Encounter: Payer: Self-pay | Admitting: Cardiovascular Disease

## 2013-12-25 VITALS — BP 118/70 | HR 68 | Ht 71.0 in | Wt 227.8 lb

## 2013-12-25 DIAGNOSIS — I471 Supraventricular tachycardia: Secondary | ICD-10-CM

## 2013-12-25 DIAGNOSIS — E7800 Pure hypercholesterolemia, unspecified: Secondary | ICD-10-CM

## 2013-12-25 DIAGNOSIS — R0789 Other chest pain: Secondary | ICD-10-CM

## 2013-12-25 DIAGNOSIS — Z951 Presence of aortocoronary bypass graft: Secondary | ICD-10-CM

## 2013-12-25 NOTE — Assessment & Plan Note (Signed)
He is doing reasonably well. He has much more energy than before and is able to do more activities without significant limitations. Continue medical therapy.

## 2013-12-25 NOTE — Assessment & Plan Note (Signed)
Continue treatment with atorvastatin with a target LDL of less than 70. 

## 2013-12-25 NOTE — Assessment & Plan Note (Signed)
Likely due to atrial tachycardia. Symptoms are reasonably controlled with current dose of metoprolol. He does have baseline bradycardia and thus did not tolerate the higher doses. If arrhythmia becomes more frequent, he might require an antiarrhythmic medication.

## 2013-12-25 NOTE — Progress Notes (Signed)
Primary care physician: Dr. Luan Pulling.  HPI  This is a 68 year old male who is here today for followup visit regarding coronary artery disease and atrial tachycardia .  He has known history of coronary artery disease status post CABG in January of 2014.  He had postoperative atrial fibrillation which was treated with amiodarone. All his anginal symptoms resolved completely.    He is known to have frequent PACs. He was seen a few months ago for  dizziness with mild palpitations and shortness of breath. He had presyncopal episode but no frank loss of consciousness. EKG showed sinus rhythm with frequent PACs. There was a short rhythm strip which showed a regular narrow complex tachycardia at a rate of 150 beats per minute.   An echocardiogram showed low normal LV systolic function with ejection fraction of 50-55% with mild mitral regurgitation. A Holter monitor showed sinus rhythm with frequent PACs and runs of SVT. There was no evidence of atrial fibrillation.  Symptoms are currently reasonably controlled with metoprolol 37.5 milligrams twice daily. He was not able to tolerate 50 mg twice daily likely due to bradycardia. He did have chest discomfort at that time but he reports improvement.   Allergies  Allergen Reactions  . Iodine Rash  . Penicillins Hives, Itching, Rash and Other (See Comments)    "broke out in welps" (05/06/2012)  . Sulfa Antibiotics Shortness Of Breath  . Iodine     Rash and itching  . Ivp Dye [Iodinated Diagnostic Agents]     Rash;redness  . Penicillins     Hives  . Sulfa Antibiotics     Difficulty Breathing     Current Outpatient Prescriptions on File Prior to Visit  Medication Sig Dispense Refill  . aspirin EC 81 MG tablet Take 81 mg by mouth 3 (three) times a week.       [name] atorvastatin (LIPITOR) 40 MG tablet TAKE ONE TABLET BY MOUTH ONCE DAILY  30 tablet  6  . carvedilol (COREG) 6.25 MG tablet Take 1 tablet (6.25 mg total) by mouth 2 (two) times daily.  180  tablet  3  . FREESTYLE LITE test strip USE TO CHECK BLOOD SUGAR 3 TIMES DAILY  100 each  0  . glucose monitoring kit (FREESTYLE) monitoring kit 1 each by Does not apply route as needed for other.  1 each  0  . insulin detemir (LEVEMIR) 100 UNIT/ML injection Inject 25 Units into the skin at bedtime.       . Insulin Pen Needle (NOVOFINE) 30G X 8 MM MISC Inject 10 each into the skin as needed.  100 each  12  . LANCETS ULTRA THIN MISC 1 Device by Does not apply route 4 (four) times daily -  before meals and at bedtime.  200 each  5  . levothyroxine (SYNTHROID, LEVOTHROID) 25 MCG tablet Take 25 mcg by mouth daily.      [name] loratadine (CLARITIN) 10 MG tablet Take 10 mg by mouth daily as needed. For allergies      . metFORMIN (GLUCOPHAGE) 500 MG tablet Take 500 mg by mouth 2 (two) times daily with a meal.      . RELION SHORT PEN NEEDLES 31G X 8 MM MISC USE AS DIRECTED AS NEEDED  100 each  0   No current facility-administered medications on file prior to visit.     Past Medical History  Diagnosis Date  . Diabetes mellitus without complication   . Thyroid disease   . Collapse of  right lung 12/1967    "for no reason" (05/06/2012)  . History of bronchitis ~ 01/2012  . Atypical angina 05/06/2012  . Exertional dyspnea     "for the last few months" (05/06/2012)  . Type II diabetes mellitus 05/06/2012    "just found out; haven't had 2 doses of the RX yet" ((05/06/2012)  . Hypothyroidism   . Arthritis     "right knee" (05/06/2012)  . Coronary artery disease     Angina with highly abnormal stress test in October of 2013. Cardiac catheterization showed severe three-vessel coronary artery disease. He underwent CABG by Dr. Pia Mau in October of 2013 with LIMA to LAD, SVG to left circumflex and SVG to right PDA  . Hypercholesteremia 03/2012  . Shingles      Past Surgical History  Procedure Laterality Date  . Knee surgery  2003    right  . Neck surgery  2008    Plate in neck  . Knee arthroscopy  ~ 2000     "right" (05/06/2012)  . Anterior cervical decomp/discectomy fusion  ~ 2009  . Intraoperative transesophageal echocardiogram  05/07/2012    Procedure: INTRAOPERATIVE TRANSESOPHAGEAL ECHOCARDIOGRAM;  Surgeon: [name], MD;  Location: [place];  Service: Open Heart Surgery;  Laterality: N/A;  . Coronary artery bypass graft  05/07/2012    Procedure: CORONARY ARTERY BYPASS GRAFTING (CABG);  Surgeon: [name], MD;  Location: [place];  Service: Open Heart Surgery;  Laterality: N/A;  times three  . Cardiac catheterization  05/06/2012    Significant three-vessel coronary artery disease with normal ejection fraction     Family History  Problem Relation Age of Onset  . Heart disease Mother   . Clotting disorder Mother   . Hypertension Sister      History   Social History  . Marital Status: Married    Spouse Name: N/A    Number of Children: N/A  . Years of Education: N/A   Occupational History  . Not on file.   Social History Main Topics  . Smoking status: Former Smoker -- 1.00 packs/day for 8 years    Types: Cigarettes    Quit date: 12/31/1967  . Smokeless tobacco: Never Used  . Alcohol Use: Yes     Comment: 05/06/2012 "last alcohol several years ago; never had problem wit"  . Drug Use: No  . Sexual Activity: Not Currently   Other Topics Concern  . Not on file   Social History Narrative   ** Merged History Encounter **            PHYSICAL EXAM   BP 118/70  Pulse 68  Ht _0  (1.803 m)  Wt 227 lb 12 oz (103.307 kg)  BMI 31.78 kg/m2  Constitutional: He is oriented to person, place, and time. He appears well-developed and well-nourished. No distress.  HENT: No nasal discharge.  Head: Normocephalic and atraumatic.  Eyes: Pupils are equal and round. Right eye exhibits no discharge. Left eye exhibits no discharge.  Neck: Normal range of motion. Neck supple. No JVD present. No thyromegaly present.  Cardiovascular: Normal rate with premature beats, regular rhythm,  normal heart sounds and. Exam reveals no gallop and no friction rub. No murmur heard.  Pulmonary/Chest: Effort normal and breath sounds normal. No stridor. No respiratory distress. He has no wheezes. He has no rales. He exhibits no tenderness.  Abdominal: Soft. Bowel sounds are normal. He exhibits no distension. There is no tenderness. There is no rebound and no guarding.  Musculoskeletal: Normal range of motion. He exhibits no edema and no tenderness.  Neurological: He is alert and oriented to person, place, and time. Coordination normal.  Skin: Skin is warm and dry. No rash noted. He is not diaphoretic. No erythema. No pallor.  Psychiatric: He has a normal mood and affect. His behavior is normal. Judgment and thought content normal.      EKG: Sinus  Rhythm  with  Paroxysmal  Atrial  Tachycardia  -Nonspecific ST depression   +   Nonspecific T-abnormality  -Nondiagnostic.   ABNORMAL    ASSESSMENT AND PLAN

## 2013-12-25 NOTE — Patient Instructions (Signed)
Make sure you no longer take Carvedilol.   Your physician wants you to follow-up in: 6 months.  You will receive a reminder letter in the mail two months in advance. If you don't receive a letter, please call our office to schedule the follow-up appointment.

## 2014-04-06 ENCOUNTER — Ambulatory Visit: Payer: Self-pay | Admitting: Family Medicine

## 2014-04-06 ENCOUNTER — Encounter: Payer: Self-pay | Admitting: *Deleted

## 2014-04-06 ENCOUNTER — Ambulatory Visit: Payer: Self-pay | Admitting: Nurse Practitioner

## 2014-04-06 ENCOUNTER — Encounter: Payer: Self-pay | Admitting: Nurse Practitioner

## 2014-04-06 LAB — HEMOGLOBIN A1C: HEMOGLOBIN A1C: 7.2 % — AB (ref 4.0–6.0)

## 2014-04-10 ENCOUNTER — Encounter: Payer: Self-pay | Admitting: Cardiovascular Disease

## 2014-04-10 ENCOUNTER — Ambulatory Visit (INDEPENDENT_AMBULATORY_CARE_PROVIDER_SITE_OTHER): Payer: Medicare Other | Admitting: Cardiovascular Disease

## 2014-04-10 VITALS — BP 122/82 | HR 78 | Ht 71.0 in | Wt 225.8 lb

## 2014-04-10 DIAGNOSIS — I471 Supraventricular tachycardia: Secondary | ICD-10-CM

## 2014-04-10 DIAGNOSIS — I499 Cardiac arrhythmia, unspecified: Secondary | ICD-10-CM

## 2014-04-10 DIAGNOSIS — I2510 Atherosclerotic heart disease of native coronary artery without angina pectoris: Secondary | ICD-10-CM

## 2014-04-10 DIAGNOSIS — E7800 Pure hypercholesterolemia, unspecified: Secondary | ICD-10-CM

## 2014-04-10 MED ORDER — METOPROLOL TARTRATE 50 MG PO TABS: 50.0000 mg | ORAL_TABLET | Freq: Two times a day (BID) | ORAL | Status: DC

## 2014-04-10 NOTE — Progress Notes (Signed)
Primary care physician: Dr. Luan Pulling.  HPI  This is a 68 year old male who is here today for followup visit regarding coronary artery disease and atrial tachycardia .  He has known history of coronary artery disease status post CABG in January of 2014.  He had postoperative atrial fibrillation which was treated with amiodarone. All his anginal symptoms resolved completely.    He is known to have frequent PACs and atrial tachycardia with pauses.  Most recent echocardiogram in May 2015 showed low normal LV systolic function with ejection fraction of 50-55% with mild mitral regurgitation. A Holter monitor in May also showed sinus rhythm with frequent PACs and runs of SVT. There was no evidence of atrial fibrillation.  He has been on metoprolol 50 mg twice daily with reasonable control. He recently had symptoms of congestion and productive cough. The symptoms actually improved after he was given Z-Pak and he feels close to his baseline. He reports worsening palpitations when he was using inhalers. He denies chest pain.   Allergies  Allergen Reactions  . Iodine Rash  . Penicillins Hives, Itching, Rash and Other (See Comments)    "broke out in welps" (05/06/2012)  . Sulfa Antibiotics Shortness Of Breath  . Iodine     Rash and itching  . Ivp Dye [Iodinated Diagnostic Agents]     Rash;redness  . Penicillins     Hives  . Sulfa Antibiotics     Difficulty Breathing     Current Outpatient Prescriptions on File Prior to Visit  Medication Sig Dispense Refill  . aspirin EC 81 MG tablet Take 81 mg by mouth 3 (three) times a week.     [name] atorvastatin (LIPITOR) 40 MG tablet TAKE ONE TABLET BY MOUTH ONCE DAILY 30 tablet 6  . FREESTYLE LITE test strip USE TO CHECK BLOOD SUGAR 3 TIMES DAILY 100 each 0  . glucose monitoring kit (FREESTYLE) monitoring kit 1 each by Does not apply route as needed for other. 1 each 0  . insulin detemir (LEVEMIR) 100 UNIT/ML injection Inject 25 Units into the skin at  bedtime.     . Insulin Pen Needle (NOVOFINE) 30G X 8 MM MISC Inject 10 each into the skin as needed. 100 each 12  . LANCETS ULTRA THIN MISC 1 Device by Does not apply route 4 (four) times daily -  before meals and at bedtime. 200 each 5  . levothyroxine (SYNTHROID, LEVOTHROID) 25 MCG tablet Take 25 mcg by mouth daily.    [name] loratadine (CLARITIN) 10 MG tablet Take 10 mg by mouth daily as needed. For allergies    . metFORMIN (GLUCOPHAGE) 500 MG tablet Take 500 mg by mouth 2 (two) times daily with a meal.    . metoprolol tartrate (LOPRESSOR) 25 MG tablet Take 1.5 mg by mouth 2 (two) times daily.    [name] RELION SHORT PEN NEEDLES 31G X 8 MM MISC USE AS DIRECTED AS NEEDED 100 each 0   No current facility-administered medications on file prior to visit.     Past Medical History  Diagnosis Date  . Diabetes mellitus without complication   . Thyroid disease   . Collapse of right lung     a. 12/1967 - ? etiology.  [name] History of bronchitis   . Type II diabetes mellitus   . Hypothyroidism   . Arthritis     a. right knee  . Hypercholesteremia   . Shingles   . Hypertension   . Coronary artery disease  a. 05/2012 Cath: severe 3VD-->CABG by Dr. Servando Snare in 05/2012 with LIMA to LAD, SVG to LCX & SVG to RPDA.  [name] Asthma      Past Surgical History  Procedure Laterality Date  . Knee surgery  2003    right  . Neck surgery  2008    Plate in neck  . Knee arthroscopy  ~ 2000    "right" (05/06/2012)  . Anterior cervical decomp/discectomy fusion  ~ 2009  . Intraoperative transesophageal echocardiogram  05/07/2012    Procedure: INTRAOPERATIVE TRANSESOPHAGEAL ECHOCARDIOGRAM;  Surgeon: [name], MD;  Location: [place];  Service: Open Heart Surgery;  Laterality: N/A;  . Coronary artery bypass graft  05/07/2012    Procedure: CORONARY ARTERY BYPASS GRAFTING (CABG);  Surgeon: [name], MD;  Location: [place];  Service: Open Heart Surgery;  Laterality: N/A;  times three  . Cardiac catheterization   05/06/2012    Significant three-vessel coronary artery disease with normal ejection fraction     Family History  Problem Relation Age of Onset  . Heart disease Mother   . Clotting disorder Mother   . Hypertension Sister      History   Social History  . Marital Status: Married    Spouse Name: N/A    Number of Children: N/A  . Years of Education: N/A   Occupational History  . Not on file.   Social History Main Topics  . Smoking status: Former Smoker -- 1.00 packs/day for 8 years    Types: Cigarettes    Quit date: 12/31/1967  . Smokeless tobacco: Never Used  . Alcohol Use: Yes     Comment: 05/06/2012 "last alcohol several years ago; never had problem wit"  . Drug Use: No  . Sexual Activity: Not Currently   Other Topics Concern  . Not on file   Social History Narrative   ** Merged History Encounter **            PHYSICAL EXAM   BP 122/82 mmHg  Pulse 78  Ht 5' 11"  (1.803 m)  Wt 225 lb 12 oz (102.4 kg)  BMI 31.50 kg/m2  Constitutional: He is oriented to person, place, and time. He appears well-developed and well-nourished. No distress.  HENT: No nasal discharge.  Head: Normocephalic and atraumatic.  Eyes: Pupils are equal and round. Right eye exhibits no discharge. Left eye exhibits no discharge.  Neck: Normal range of motion. Neck supple. No JVD present. No thyromegaly present.  Cardiovascular: Normal rate with premature beats, regular rhythm, normal heart sounds and. Exam reveals no gallop and no friction rub. No murmur heard.  Pulmonary/Chest: Effort normal and breath sounds normal. No stridor. No respiratory distress. He has no wheezes. He has no rales. He exhibits no tenderness.  Abdominal: Soft. Bowel sounds are normal. He exhibits no distension. There is no tenderness. There is no rebound and no guarding.  Musculoskeletal: Normal range of motion. He exhibits no edema and no tenderness.  Neurological: He is alert and oriented to person, place, and time.  Coordination normal.  Skin: Skin is warm and dry. No rash noted. He is not diaphoretic. No erythema. No pallor.  Psychiatric: He has a normal mood and affect. His behavior is normal. Judgment and thought content normal.      EKG: Sinus rhythm with frequent PACs -  Nonspecific T-abnormality.   ABNORMAL     ASSESSMENT AND PLAN

## 2014-04-10 NOTE — Patient Instructions (Signed)
Rx for Metoprolol 50 mg tablets twice daily was sent   Your physician recommends that you schedule a follow-up appointment in:  Dr. Fletcher Anon in 3 months

## 2014-04-12 NOTE — Assessment & Plan Note (Signed)
The patient continues to have intermittent episodes of atrial tachycardia with intermittent nonsignificant pauses. His symptoms are reasonably controlled on current dose of metoprolol 50 mg twice daily. I don't see evidence of atrial fibrillation. If his pauses worsen in the future, he might require a permanent pacemaker placement. However, at the present time he seems to be stable.   

## 2014-04-12 NOTE — Assessment & Plan Note (Signed)
Continue treatment with atorvastatin with a target LDL of less than 70. 

## 2014-04-12 NOTE — Assessment & Plan Note (Signed)
He has no clear symptoms of angina. I recommend continuing medical therapy. The recent congestion and cough was likely due to bronchitis and has improved with azithromycin.

## 2014-05-14 DIAGNOSIS — J329 Chronic sinusitis, unspecified: Secondary | ICD-10-CM | POA: Diagnosis not present

## 2014-06-11 DIAGNOSIS — J449 Chronic obstructive pulmonary disease, unspecified: Secondary | ICD-10-CM | POA: Diagnosis not present

## 2014-06-11 DIAGNOSIS — R0602 Shortness of breath: Secondary | ICD-10-CM | POA: Diagnosis not present

## 2014-06-11 DIAGNOSIS — J310 Chronic rhinitis: Secondary | ICD-10-CM | POA: Diagnosis not present

## 2014-06-29 DIAGNOSIS — J449 Chronic obstructive pulmonary disease, unspecified: Secondary | ICD-10-CM | POA: Diagnosis not present

## 2014-07-06 ENCOUNTER — Ambulatory Visit (INDEPENDENT_AMBULATORY_CARE_PROVIDER_SITE_OTHER): Payer: Medicare Other | Admitting: Cardiovascular Disease

## 2014-07-06 ENCOUNTER — Encounter: Payer: Self-pay | Admitting: Cardiovascular Disease

## 2014-07-06 VITALS — BP 124/70 | HR 84 | Ht 71.5 in | Wt 223.0 lb

## 2014-07-06 DIAGNOSIS — E7800 Pure hypercholesterolemia, unspecified: Secondary | ICD-10-CM

## 2014-07-06 DIAGNOSIS — I25118 Atherosclerotic heart disease of native coronary artery with other forms of angina pectoris: Secondary | ICD-10-CM | POA: Diagnosis not present

## 2014-07-06 DIAGNOSIS — R079 Chest pain, unspecified: Secondary | ICD-10-CM | POA: Diagnosis not present

## 2014-07-06 DIAGNOSIS — I471 Supraventricular tachycardia: Secondary | ICD-10-CM

## 2014-07-06 NOTE — Patient Instructions (Addendum)
[name]  Your caregiver has ordered a Stress Test with nuclear imaging. The purpose of this test is to evaluate the blood supply to your heart muscle. This procedure is referred to as a "Non-Invasive Stress Test." This is because other than having an IV started in your vein, nothing is inserted or "invades" your body. Cardiac stress tests are done to find areas of poor blood flow to the heart by determining the extent of coronary artery disease (CAD). Some patients exercise on a treadmill, which naturally increases the blood flow to your heart, while others who are  unable to walk on a treadmill due to physical limitations have a pharmacologic/chemical stress agent called Lexiscan . This medicine will mimic walking on a treadmill by temporarily increasing your coronary blood flow.   Please note: these test may take anywhere between 2-4 hours to complete  PLEASE REPORT TO [facility] AT THE FIRST DESK WILL DIRECT YOU WHERE TO GO  Date of Procedure:_______3/14/16______________________________  Arrival Time for Procedure:_______0715am _______________________  Instructions regarding medication:   __x__ : Hold diabetes medication morning of procedure  __x__:  Hold betablocker morning of procedure: Metoprolol    PLEASE NOTIFY THE OFFICE AT LEAST 24 HOURS IN ADVANCE IF YOU ARE UNABLE TO KEEP YOUR APPOINTMENT.  [phone] AND  PLEASE NOTIFY NUCLEAR MEDICINE AT [facility] AT LEAST 24 HOURS IN ADVANCE IF YOU ARE UNABLE TO KEEP YOUR APPOINTMENT. [phone]  How to prepare for your Myoview test:  1. Do not eat or drink after midnight 2. No caffeine for 24 hours prior to test 3. No smoking 24 hours prior to test. 4. Your medication may be taken with water.  If your doctor stopped a medication because of this test, do not take that medication. 5. Ladies, please do not wear dresses.  Skirts or pants are appropriate. Please wear a short sleeve shirt. 6. No perfume,  cologne or lotion. 7. Wear comfortable walking shoes. No heels!   Your physician wants you to follow-up in: 6 months. You will receive a reminder letter in the mail two months in advance. If you don't receive a letter, please call our office to schedule the follow-up appointment.

## 2014-07-06 NOTE — Assessment & Plan Note (Signed)
Recent symptoms of shortness of breath and increased wheezing or likely related to lung disease. However, he had few episodes of substernal chest tightness recently and I think it's important to rule out underlying cardiac etiology. I requested a treadmill nuclear stress test for evaluation as he had previous CABG.

## 2014-07-06 NOTE — Progress Notes (Signed)
Primary care physician: Dr. Luan Pulling.  HPI  This is a 69 year old male who is here today for followup visit regarding coronary artery disease and atrial tachycardia .  He has known history of coronary artery disease status post CABG in January of 2014.  He had postoperative atrial fibrillation which was treated with amiodarone. All his anginal symptoms resolved completely.    He is known to have frequent PACs and atrial tachycardia with pauses.  Most recent echocardiogram in May 2015 showed low normal LV systolic function with ejection fraction of 50-55% with mild mitral regurgitation. A Holter monitor in May also showed sinus rhythm with frequent PACs and runs of SVT. There was no evidence of atrial fibrillation.  He has been on metoprolol 50 mg twice daily with reasonable control. He has been suffering from symptoms of cough, congestion and increased dyspnea over the last 2 months. He was seen by Dr. Raul Del and was diagnosed with mild COPD as well as allergies. He is starting to feel better. However, he had few episodes of substernal chest tightness. He denies dizziness.  Allergies  Allergen Reactions  . Iodine Rash  . Penicillins Hives, Itching, Rash and Other (See Comments)    "broke out in welps" (05/06/2012)  . Sulfa Antibiotics Shortness Of Breath  . Iodine     Rash and itching  . Ivp Dye [Iodinated Diagnostic Agents]     Rash;redness  . Penicillins     Hives  . Sulfa Antibiotics     Difficulty Breathing     Current Outpatient Prescriptions on File Prior to Visit  Medication Sig Dispense Refill  . aspirin EC 81 MG tablet Take 81 mg by mouth 3 (three) times a week.     [name] atorvastatin (LIPITOR) 40 MG tablet TAKE ONE TABLET BY MOUTH ONCE DAILY 30 tablet 6  . FREESTYLE LITE test strip USE TO CHECK BLOOD SUGAR 3 TIMES DAILY 100 each 0  . glucose monitoring kit (FREESTYLE) monitoring kit 1 each by Does not apply route as needed for other. 1 each 0  . insulin detemir (LEVEMIR) 100  UNIT/ML injection Inject 25 Units into the skin at bedtime.     . Insulin Pen Needle (NOVOFINE) 30G X 8 MM MISC Inject 10 each into the skin as needed. 100 each 12  . LANCETS ULTRA THIN MISC 1 Device by Does not apply route 4 (four) times daily -  before meals and at bedtime. 200 each 5  . levothyroxine (SYNTHROID, LEVOTHROID) 25 MCG tablet Take 25 mcg by mouth daily.    [name] loratadine (CLARITIN) 10 MG tablet Take 10 mg by mouth daily as needed. For allergies    . metFORMIN (GLUCOPHAGE) 500 MG tablet Take 500 mg by mouth 2 (two) times daily with a meal.    . metoprolol (LOPRESSOR) 50 MG tablet Take 1 tablet (50 mg total) by mouth 2 (two) times daily. 60 tablet 6  . RELION SHORT PEN NEEDLES 31G X 8 MM MISC USE AS DIRECTED AS NEEDED 100 each 0   No current facility-administered medications on file prior to visit.     Past Medical History  Diagnosis Date  . Diabetes mellitus without complication   . Thyroid disease   . Collapse of right lung     a. 12/1967 - ? etiology.  [name] History of bronchitis   . Type II diabetes mellitus   . Hypothyroidism   . Arthritis     a. right knee  . Hypercholesteremia   .  Shingles   . Hypertension   . Coronary artery disease     a. 05/2012 Cath: severe 3VD-->CABG by Dr. Servando Snare in 05/2012 with LIMA to LAD, SVG to LCX & SVG to RPDA.  [name] Asthma      Past Surgical History  Procedure Laterality Date  . Knee surgery  2003    right  . Neck surgery  2008    Plate in neck  . Knee arthroscopy  ~ 2000    "right" (05/06/2012)  . Anterior cervical decomp/discectomy fusion  ~ 2009  . Intraoperative transesophageal echocardiogram  05/07/2012    Procedure: INTRAOPERATIVE TRANSESOPHAGEAL ECHOCARDIOGRAM;  Surgeon: [name], MD;  Location: [facility];  Service: Open Heart Surgery;  Laterality: N/A;  . Coronary artery bypass graft  05/07/2012    Procedure: CORONARY ARTERY BYPASS GRAFTING (CABG);  Surgeon: [name], MD;  Location: [facility];  Service: Open Heart  Surgery;  Laterality: N/A;  times three  . Cardiac catheterization  05/06/2012    Significant three-vessel coronary artery disease with normal ejection fraction     Family History  Problem Relation Age of Onset  . Heart disease Mother   . Clotting disorder Mother   . Hypertension Sister      History   Social History  . Marital Status: Married    Spouse Name: N/A  . Number of Children: N/A  . Years of Education: N/A   Occupational History  . Not on file.   Social History Main Topics  . Smoking status: Former Smoker -- 1.00 packs/day for 8 years    Types: Cigarettes    Quit date: 12/31/1967  . Smokeless tobacco: Never Used  . Alcohol Use: Yes     Comment: 05/06/2012 "last alcohol several years ago; never had problem wit"  . Drug Use: No  . Sexual Activity: Not Currently   Other Topics Concern  . Not on file   Social History Narrative   ** Merged History Encounter **            PHYSICAL EXAM   BP 124/70 mmHg  Pulse 84  Ht 5' 11.5" (1.816 m)  Wt 223 lb (101.152 kg)  BMI 30.67 kg/m2  Constitutional: He is oriented to person, place, and time. He appears well-developed and well-nourished. No distress.  HENT: No nasal discharge.  Head: Normocephalic and atraumatic.  Eyes: Pupils are equal and round. Right eye exhibits no discharge. Left eye exhibits no discharge.  Neck: Normal range of motion. Neck supple. No JVD present. No thyromegaly present.  Cardiovascular: Normal rate with premature beats, regular rhythm, normal heart sounds and. Exam reveals no gallop and no friction rub. No murmur heard.  Pulmonary/Chest: Effort normal and breath sounds normal. No stridor. No respiratory distress. He has no wheezes. He has no rales. He exhibits no tenderness.  Abdominal: Soft. Bowel sounds are normal. He exhibits no distension. There is no tenderness. There is no rebound and no guarding.  Musculoskeletal: Normal range of motion. He exhibits no edema and no tenderness.    Neurological: He is alert and oriented to person, place, and time. Coordination normal.  Skin: Skin is warm and dry. No rash noted. He is not diaphoretic. No erythema. No pallor.  Psychiatric: He has a normal mood and affect. His behavior is normal. Judgment and thought content normal.      EKG: Sinus rhythm with frequent PACs -  Nonspecific T-abnormality.   ABNORMAL     ASSESSMENT AND PLAN

## 2014-07-06 NOTE — Assessment & Plan Note (Signed)
Continue treatment with atorvastatin. I recommend a target LDL of less than 70.

## 2014-07-06 NOTE — Assessment & Plan Note (Signed)
The patient continues to have intermittent episodes of atrial tachycardia with intermittent nonsignificant pauses. His symptoms are reasonably controlled on current dose of metoprolol 50 mg twice daily. I don't see evidence of atrial fibrillation. If his pauses worsen in the future, he might require a permanent pacemaker placement. However, at the present time he seems to be stable.

## 2014-07-13 ENCOUNTER — Ambulatory Visit: Payer: Self-pay | Admitting: Cardiovascular Disease

## 2014-07-13 DIAGNOSIS — R079 Chest pain, unspecified: Secondary | ICD-10-CM | POA: Diagnosis not present

## 2014-07-15 ENCOUNTER — Other Ambulatory Visit: Payer: Self-pay

## 2014-07-15 DIAGNOSIS — R079 Chest pain, unspecified: Secondary | ICD-10-CM

## 2014-10-05 DIAGNOSIS — J449 Chronic obstructive pulmonary disease, unspecified: Secondary | ICD-10-CM | POA: Diagnosis not present

## 2014-11-09 ENCOUNTER — Ambulatory Visit: Payer: Self-pay | Admitting: Family Medicine

## 2014-11-09 DIAGNOSIS — M7070 Other bursitis of hip, unspecified hip: Secondary | ICD-10-CM | POA: Insufficient documentation

## 2014-11-09 DIAGNOSIS — I459 Conduction disorder, unspecified: Secondary | ICD-10-CM | POA: Insufficient documentation

## 2014-11-09 DIAGNOSIS — E0590 Thyrotoxicosis, unspecified without thyrotoxic crisis or storm: Secondary | ICD-10-CM | POA: Insufficient documentation

## 2014-11-09 DIAGNOSIS — N400 Enlarged prostate without lower urinary tract symptoms: Secondary | ICD-10-CM | POA: Insufficient documentation

## 2014-11-09 DIAGNOSIS — I129 Hypertensive chronic kidney disease with stage 1 through stage 4 chronic kidney disease, or unspecified chronic kidney disease: Secondary | ICD-10-CM | POA: Insufficient documentation

## 2014-11-09 DIAGNOSIS — N182 Chronic kidney disease, stage 2 (mild): Secondary | ICD-10-CM

## 2014-11-09 DIAGNOSIS — J432 Centrilobular emphysema: Secondary | ICD-10-CM | POA: Insufficient documentation

## 2014-11-09 DIAGNOSIS — R001 Bradycardia, unspecified: Secondary | ICD-10-CM | POA: Insufficient documentation

## 2014-11-30 ENCOUNTER — Other Ambulatory Visit: Payer: Self-pay

## 2014-11-30 ENCOUNTER — Telehealth: Payer: Self-pay | Admitting: Family Medicine

## 2014-11-30 MED ORDER — METOPROLOL TARTRATE 50 MG PO TABS: 50.0000 mg | ORAL_TABLET | Freq: Two times a day (BID) | ORAL | Status: DC

## 2014-11-30 NOTE — Telephone Encounter (Signed)
This was Rx  by Dr. Fletcher Anon in 03/2014 pt had  visit on 12/2013 for follow up and in 05/2014 for Asthma do you want me to Rx this one Dr. Luan Pulling or Dr. Fletcher Anon have  to RX this ?

## 2014-11-30 NOTE — Telephone Encounter (Signed)
Pt wife stop by  Requesting a refill on metoprolol tart 200 mg  [facility] [address]  Pt. wife call back # [phone]

## 2014-11-30 NOTE — Telephone Encounter (Signed)
Since Dr. Fletcher Anon initiated this med, he should check with his office first.-jh

## 2014-11-30 NOTE — Telephone Encounter (Signed)
Pt advised as per Dr. Luan Pulling.

## 2014-12-10 ENCOUNTER — Encounter: Payer: Self-pay | Admitting: Family Medicine

## 2014-12-10 ENCOUNTER — Ambulatory Visit (INDEPENDENT_AMBULATORY_CARE_PROVIDER_SITE_OTHER): Payer: Medicare Other | Admitting: Cardiovascular Disease

## 2014-12-10 ENCOUNTER — Encounter: Payer: Self-pay | Admitting: Cardiovascular Disease

## 2014-12-10 ENCOUNTER — Ambulatory Visit (INDEPENDENT_AMBULATORY_CARE_PROVIDER_SITE_OTHER): Payer: Medicare Other | Admitting: Family Medicine

## 2014-12-10 ENCOUNTER — Telehealth: Payer: Self-pay

## 2014-12-10 VITALS — BP 135/70 | HR 80 | Temp 97.7°F | Resp 16 | Ht 71.5 in | Wt 230.0 lb

## 2014-12-10 VITALS — BP 142/68 | HR 78 | Ht 71.5 in | Wt 232.2 lb

## 2014-12-10 DIAGNOSIS — E038 Other specified hypothyroidism: Secondary | ICD-10-CM | POA: Diagnosis not present

## 2014-12-10 DIAGNOSIS — I208 Other forms of angina pectoris: Secondary | ICD-10-CM

## 2014-12-10 DIAGNOSIS — E7800 Pure hypercholesterolemia, unspecified: Secondary | ICD-10-CM

## 2014-12-10 DIAGNOSIS — E119 Type 2 diabetes mellitus without complications: Secondary | ICD-10-CM

## 2014-12-10 DIAGNOSIS — I10 Essential (primary) hypertension: Secondary | ICD-10-CM | POA: Diagnosis not present

## 2014-12-10 DIAGNOSIS — E034 Atrophy of thyroid (acquired): Secondary | ICD-10-CM | POA: Diagnosis not present

## 2014-12-10 DIAGNOSIS — E785 Hyperlipidemia, unspecified: Secondary | ICD-10-CM

## 2014-12-10 DIAGNOSIS — I2510 Atherosclerotic heart disease of native coronary artery without angina pectoris: Secondary | ICD-10-CM | POA: Diagnosis not present

## 2014-12-10 DIAGNOSIS — J4540 Moderate persistent asthma, uncomplicated: Secondary | ICD-10-CM | POA: Diagnosis not present

## 2014-12-10 DIAGNOSIS — I4891 Unspecified atrial fibrillation: Secondary | ICD-10-CM

## 2014-12-10 LAB — POCT GLYCOSYLATED HEMOGLOBIN (HGB A1C): Hemoglobin A1C: 8.4

## 2014-12-10 MED ORDER — APIXABAN 5 MG PO TABS: 5.0000 mg | ORAL_TABLET | Freq: Two times a day (BID) | ORAL | Status: DC

## 2014-12-10 MED ORDER — METFORMIN HCL 500 MG PO TABS: ORAL_TABLET | ORAL | Status: DC

## 2014-12-10 NOTE — Telephone Encounter (Signed)
S/w Dr. Luan Pulling at [facility], who states pt is in office and is in afib.  Pt has no history of afib. VS: 130/80, HR 80, 95% RA, no signs of distress. Would like pt to be seen by Dr. Fletcher Anon today. Per Izora Gala, 3:30 appt today.

## 2014-12-10 NOTE — Assessment & Plan Note (Signed)
The patient has no symptoms of angina. Continue medical therapy.

## 2014-12-10 NOTE — Assessment & Plan Note (Signed)
Blood pressure is reasonably controlled on current medications. 

## 2014-12-10 NOTE — Patient Instructions (Signed)
Cont. Current meds for now except for Metformin.

## 2014-12-10 NOTE — Progress Notes (Signed)
Name: Philip DELMAN Sr.   MRN: [id number]    DOB: 11/25/1945   Date:12/10/2014       Progress Note  Subjective  Chief Complaint  Chief Complaint  Patient presents with  . Diabetes    Pt here f/u diabetes. Pt also c/o chest congestion and voice change; He is established with Dr. Raul Del.  [name] Hypertension    HPI  Here for f/u of DM, HBP.  Also has asthma, hypothyroid, elevated lipids. C/o hoarseness x 3-4 wks.  Seeing Dr. Pryor Ochoa next week.   No problem-specific assessment & plan notes found for this encounter.   Past Medical History  Diagnosis Date  . Diabetes mellitus without complication   . Thyroid disease   . Collapse of right lung     a. 12/1967 - ? etiology.  [name] History of bronchitis   . Type II diabetes mellitus   . Hypothyroidism   . Arthritis     a. right knee  . Hypercholesteremia   . Shingles   . Hypertension   . Coronary artery disease     a. 05/2012 Cath: severe 3VD-->CABG by Dr. Servando Snare in 05/2012 with LIMA to LAD, SVG to LCX & SVG to RPDA.  [name] Asthma     Social History  Substance Use Topics  . Smoking status: Former Smoker -- 1.00 packs/day for 8 years    Types: Cigarettes    Quit date: 12/31/1967  . Smokeless tobacco: Never Used  . Alcohol Use: Yes     Comment: 05/06/2012 "last alcohol several years ago; never had problem wit"     Current outpatient prescriptions:  .  albuterol (VENTOLIN HFA) 108 (90 BASE) MCG/ACT inhaler, Inhale into the lungs., Disp: , Rfl:  .  aspirin EC 81 MG tablet, Take 81 mg by mouth 3 (three) times a week. , Disp: , Rfl:  .  atorvastatin (LIPITOR) 40 MG tablet, TAKE ONE TABLET BY MOUTH ONCE DAILY, Disp: 30 tablet, Rfl: 6 .  Cetirizine HCl (ZYRTEC ALLERGY) 10 MG CAPS, Take by mouth., Disp: , Rfl:  .  fluticasone (FLONASE ALLERGY RELIEF) 50 MCG/ACT nasal spray, Place into the nose., Disp: , Rfl:  .  glucose monitoring kit (FREESTYLE) monitoring kit, 1 each by Does not apply route as needed for other., Disp: 1 each, Rfl: 0 .   Insulin Pen Needle (NOVOFINE) 30G X 8 MM MISC, Inject 10 each into the skin as needed., Disp: 100 each, Rfl: 12 .  LANCETS ULTRA THIN MISC, 1 Device by Does not apply route 4 (four) times daily -  before meals and at bedtime., Disp: 200 each, Rfl: 5 .  levothyroxine (SYNTHROID, LEVOTHROID) 25 MCG tablet, Take 25 mcg by mouth daily., Disp: , Rfl:  .  losartan (COZAAR) 100 MG tablet, Take by mouth., Disp: , Rfl:  .  metFORMIN (GLUCOPHAGE-XR) 500 MG 24 hr tablet, Take 500 mg by mouth 3 (three) times daily. , Disp: , Rfl:  .  metoprolol (LOPRESSOR) 50 MG tablet, Take 1 tablet (50 mg total) by mouth 2 (two) times daily., Disp: 60 tablet, Rfl: 1 .  ONE TOUCH ULTRA TEST test strip, , Disp: , Rfl:   Allergies  Allergen Reactions  . Iodine Rash  . Penicillins Hives, Itching, Rash and Other (See Comments)    "broke out in welps" (05/06/2012)  . Sulfa Antibiotics Shortness Of Breath  . Iodine     Rash and itching  . Ivp Dye [Iodinated Diagnostic Agents]     Rash;redness  .  Penicillins     Hives  . Sulfa Antibiotics     Difficulty Breathing    Review of Systems  Constitutional: Negative for fever, chills, weight loss and malaise/fatigue.  HENT: Positive for congestion.        Hoareness  Eyes: Negative for blurred vision and double vision.  Respiratory: Positive for shortness of breath and wheezing. Negative for cough and sputum production.   Cardiovascular: Negative for chest pain, palpitations, orthopnea and leg swelling.  Gastrointestinal: Positive for abdominal pain. Negative for heartburn, nausea, vomiting, diarrhea and blood in stool.  Genitourinary: Negative for dysuria, urgency and frequency.  Skin: Negative for rash.  Neurological: Negative for dizziness, tremors, weakness and headaches.       Objective  Filed Vitals:   12/10/14 1112  BP: 151/95  Pulse: 46  Temp: 97.7 F (36.5 C)  Resp: 16  Height: 5' 11.5" (1.816 m)  Weight: 230 lb (104.327 kg)  SpO2: 95%      Physical Exam  Constitutional: He is oriented to person, place, and time and well-developed, well-nourished, and in no distress. No distress.  HENT:  Head: Normocephalic and atraumatic.  Voice with hoarseness.  Eyes: Conjunctivae and EOM are normal. Pupils are equal, round, and reactive to light. No scleral icterus.  Neck: Normal range of motion. Neck supple. Carotid bruit is not present. No thyromegaly present.  Cardiovascular: Normal rate, normal heart sounds and intact distal pulses.  An irregularly irregular rhythm present. Exam reveals no gallop and no friction rub.   No murmur heard. Pulmonary/Chest: Effort normal and breath sounds normal. No respiratory distress. He has no wheezes. He has no rales.  Abdominal: Soft. Bowel sounds are normal. He exhibits no distension, no abdominal bruit and no mass. There is no tenderness.  Musculoskeletal: He exhibits edema (trace bilateral pedal edema).  Lymphadenopathy:    He has no cervical adenopathy.  Neurological: He is alert and oriented to person, place, and time.  Vitals reviewed.      Recent Results (from the past 2160 hour(s))  POCT HgB A1C     Status: Abnormal   Collection Time: 12/10/14 11:27 AM  Result Value Ref Range   Hemoglobin A1C 8.4      Assessment & Plan  1. Type 2 diabetes mellitus without complication  - POCT HgB A1C-8.4 - metFORMIN (GLUCOPHAGE) 500 MG tablet; Take 2 tablets by mouth twice daily, with food  Dispense: 360 tablet; Refill: 3  2. Hypothyroidism due to acquired atrophy of thyroid  - TSH  3. Asthma, moderate persistent, uncomplicated   4. Atypical angina   5. Atrial fibrillation, unspecified  - EKG 12-Lead- atrial fib - CBC with Differential - Ambulatory referral to Cardiology  6. Hyperlipidemia  - Comprehensive Metabolic Panel (CMET) - Lipid Profile

## 2014-12-10 NOTE — Assessment & Plan Note (Signed)
Continue treatment with atorvastatin. He is going to have fasting lipid and liver profile.

## 2014-12-10 NOTE — Assessment & Plan Note (Signed)
We repeated the EKG and I reviewed the EKGs from Dr. Luan Pulling office. There might be intermittent P waves noted. However, there seems to be clearly atrial fibrillation. Ventricular rate is well controlled on metoprolol. CHADS VASc score is 4. Thus, I recommend long-term anticoagulation. I discussed different options with him and decided to start Eliquis 5 mg twice daily. I discontinued aspirin. The patient is going to have routine labs done tomorrow.

## 2014-12-10 NOTE — Patient Instructions (Signed)
Medication Instructions:  Your physician has recommended you make the following change in your medication:  STOP taking aspirin START taking Eliquis 5mg  twice per day   Labwork: none  Testing/Procedures: none  Follow-Up: Your physician wants you to follow-up in: six months with Dr. Fletcher Anon.  You will receive a reminder letter in the mail two months in advance. If you don't receive a letter, please call our office to schedule the follow-up appointment.   Any Other Special Instructions Will Be Listed Below (If Applicable).

## 2014-12-10 NOTE — Progress Notes (Signed)
Primary care physician: Dr. Luan Pulling.  HPI  This is a 69 year old male who is here today for followup visit regarding coronary artery disease and atrial tachycardia .  He has known history of coronary artery disease status post CABG in January of 2014.  He had postoperative atrial fibrillation which was treated with amiodarone. All his anginal symptoms resolved completely.    He is known to have frequent PACs and atrial tachycardia with pauses.  Most recent echocardiogram in May 2015 showed low normal LV systolic function with ejection fraction of 50-55% with mild mitral regurgitation. A Holter monitor in May also showed sinus rhythm with frequent PACs and runs of SVT. There was no evidence of atrial fibrillation.  He has been on metoprolol 50 mg twice daily with reasonable control.  He saw Dr. Luan Pulling today. EKG showed atrial fibrillation and thus he was sent for evaluation. The patient has no symptoms related to this.    Allergies  Allergen Reactions  . Iodine Rash  . Penicillins Hives, Itching, Rash and Other (See Comments)    "broke out in welps" (05/06/2012)  . Sulfa Antibiotics Shortness Of Breath  . Iodine     Rash and itching  . Ivp Dye [Iodinated Diagnostic Agents]     Rash;redness  . Penicillins     Hives  . Sulfa Antibiotics     Difficulty Breathing     Current Outpatient Prescriptions on File Prior to Visit  Medication Sig Dispense Refill  . albuterol (VENTOLIN HFA) 108 (90 BASE) MCG/ACT inhaler Inhale into the lungs.    [name] aspirin EC 81 MG tablet Take 81 mg by mouth 3 (three) times a week.     [name] atorvastatin (LIPITOR) 40 MG tablet TAKE ONE TABLET BY MOUTH ONCE DAILY 30 tablet 6  . Cetirizine HCl (ZYRTEC ALLERGY) 10 MG CAPS Take by mouth.    . fluticasone (FLONASE ALLERGY RELIEF) 50 MCG/ACT nasal spray Place into the nose.    [name] glucose monitoring kit (FREESTYLE) monitoring kit 1 each by Does not apply route as needed for other. 1 each 0  . LANCETS ULTRA THIN MISC 1  Device by Does not apply route 4 (four) times daily -  before meals and at bedtime. 200 each 5  . levothyroxine (SYNTHROID, LEVOTHROID) 25 MCG tablet Take 25 mcg by mouth daily.    [name] losartan (COZAAR) 100 MG tablet Take 100 mg by mouth daily.     . metFORMIN (GLUCOPHAGE) 500 MG tablet Take 2 tablets by mouth twice daily, with food 360 tablet 3  . metoprolol (LOPRESSOR) 50 MG tablet Take 1 tablet (50 mg total) by mouth 2 (two) times daily. 60 tablet 1  . ONE TOUCH ULTRA TEST test strip      No current facility-administered medications on file prior to visit.     Past Medical History  Diagnosis Date  . Diabetes mellitus without complication   . Thyroid disease   . Collapse of right lung     a. 12/1967 - ? etiology.  [name] History of bronchitis   . Type II diabetes mellitus   . Hypothyroidism   . Arthritis     a. right knee  . Hypercholesteremia   . Shingles   . Hypertension   . Coronary artery disease     a. 05/2012 Cath: severe 3VD-->CABG by Dr. Servando Snare in 05/2012 with LIMA to LAD, SVG to LCX & SVG to RPDA.  [name] Asthma      Past Surgical History  Procedure Laterality Date  . Knee surgery  2003    right  . Neck surgery  2008    Plate in neck  . Knee arthroscopy  ~ 2000    "right" (05/06/2012)  . Anterior cervical decomp/discectomy fusion  ~ 2009  . Intraoperative transesophageal echocardiogram  05/07/2012    Procedure: INTRAOPERATIVE TRANSESOPHAGEAL ECHOCARDIOGRAM;  Surgeon: [name], MD;  Location: [facility];  Service: Open Heart Surgery;  Laterality: N/A;  . Coronary artery bypass graft  05/07/2012    Procedure: CORONARY ARTERY BYPASS GRAFTING (CABG);  Surgeon: [name], MD;  Location: [facility];  Service: Open Heart Surgery;  Laterality: N/A;  times three  . Cardiac catheterization  05/06/2012    Significant three-vessel coronary artery disease with normal ejection fraction     Family History  Problem Relation Age of Onset  . Heart disease Mother   . Clotting disorder  Mother   . Hypertension Sister      Social History   Social History  . Marital Status: Married    Spouse Name: N/A  . Number of Children: N/A  . Years of Education: N/A   Occupational History  . Not on file.   Social History Main Topics  . Smoking status: Former Smoker -- 1.00 packs/day for 8 years    Types: Cigarettes    Quit date: 12/31/1967  . Smokeless tobacco: Never Used  . Alcohol Use: Yes     Comment: 05/06/2012 "last alcohol several years ago; never had problem wit"  . Drug Use: No  . Sexual Activity: Not Currently   Other Topics Concern  . Not on file   Social History Narrative   ** Merged History Encounter **            PHYSICAL EXAM   BP 142/68 mmHg  Pulse 78  Ht 5' 11.5" (1.816 m)  Wt 232 lb 4 oz (105.348 kg)  BMI 31.94 kg/m2  Constitutional: He is oriented to person, place, and time. He appears well-developed and well-nourished. No distress.  HENT: No nasal discharge.  Head: Normocephalic and atraumatic.  Eyes: Pupils are equal and round. Right eye exhibits no discharge. Left eye exhibits no discharge.  Neck: Normal range of motion. Neck supple. No JVD present. No thyromegaly present.  Cardiovascular: Normal rate , irregular rhythm, normal heart sounds and. Exam reveals no gallop and no friction rub. No murmur heard.  Pulmonary/Chest: Effort normal and breath sounds normal. No stridor. No respiratory distress. He has no wheezes. He has no rales. He exhibits no tenderness.  Abdominal: Soft. Bowel sounds are normal. He exhibits no distension. There is no tenderness. There is no rebound and no guarding.  Musculoskeletal: Normal range of motion. He exhibits no edema and no tenderness.  Neurological: He is alert and oriented to person, place, and time. Coordination normal.  Skin: Skin is warm and dry. No rash noted. He is not diaphoretic. No erythema. No pallor.  Psychiatric: He has a normal mood and affect. His behavior is normal. Judgment and thought  content normal.      EKG: Atrial fibrillation  -Nonspecific ST depression   +   Nonspecific T-abnormality  -Nondiagnostic.   ABNORMAL      ASSESSMENT AND PLAN

## 2014-12-11 ENCOUNTER — Ambulatory Visit: Payer: Medicare Other | Admitting: Family Medicine

## 2014-12-14 DIAGNOSIS — E034 Atrophy of thyroid (acquired): Secondary | ICD-10-CM | POA: Diagnosis not present

## 2014-12-14 DIAGNOSIS — I4891 Unspecified atrial fibrillation: Secondary | ICD-10-CM | POA: Diagnosis not present

## 2014-12-14 DIAGNOSIS — E785 Hyperlipidemia, unspecified: Secondary | ICD-10-CM | POA: Diagnosis not present

## 2014-12-14 DIAGNOSIS — E038 Other specified hypothyroidism: Secondary | ICD-10-CM | POA: Diagnosis not present

## 2014-12-14 LAB — CBC WITH DIFFERENTIAL/PLATELET
BASOS ABS: 0.1 10*3/uL (ref 0.0–0.2)
BASOS: 1 %
EOS (ABSOLUTE): 0.4 10*3/uL (ref 0.0–0.4)
Eos: 6 %
Hematocrit: 44.5 % (ref 37.5–51.0)
Hemoglobin: 16 g/dL (ref 12.6–17.7)
IMMATURE GRANS (ABS): 0 10*3/uL (ref 0.0–0.1)
IMMATURE GRANULOCYTES: 0 %
Lymphocytes Absolute: 1.6 10*3/uL (ref 0.7–3.1)
Lymphs: 23 %
MCH: 30.6 pg (ref 26.6–33.0)
MCHC: 36 g/dL — ABNORMAL HIGH (ref 31.5–35.7)
MCV: 85 fL (ref 79–97)
MONOS ABS: 0.6 10*3/uL (ref 0.1–0.9)
Monocytes: 9 %
NEUTROS PCT: 61 %
Neutrophils Absolute: 4.2 10*3/uL (ref 1.4–7.0)
PLATELETS: 188 10*3/uL (ref 150–379)
RBC: 5.23 x10E6/uL (ref 4.14–5.80)
RDW: 13.2 % (ref 12.3–15.4)
WBC: 6.9 10*3/uL (ref 3.4–10.8)

## 2014-12-15 LAB — COMPREHENSIVE METABOLIC PANEL
A/G RATIO: 2.3 (ref 1.1–2.5)
ALK PHOS: 106 IU/L (ref 39–117)
ALT: 26 IU/L (ref 0–44)
AST: 15 IU/L (ref 0–40)
Albumin: 4.1 g/dL (ref 3.6–4.8)
BUN/Creatinine Ratio: 14 (ref 10–22)
BUN: 15 mg/dL (ref 8–27)
Bilirubin Total: 0.5 mg/dL (ref 0.0–1.2)
CALCIUM: 9.2 mg/dL (ref 8.6–10.2)
CO2: 26 mmol/L (ref 18–29)
Chloride: 102 mmol/L (ref 97–108)
Creatinine, Ser: 1.1 mg/dL (ref 0.76–1.27)
GFR calc Af Amer: 80 mL/min/{1.73_m2} (ref >59)
GFR, EST NON AFRICAN AMERICAN: 69 mL/min/{1.73_m2} (ref >59)
GLOBULIN, TOTAL: 1.8 g/dL (ref 1.5–4.5)
Glucose: 161 mg/dL — ABNORMAL HIGH (ref 65–99)
POTASSIUM: 4.9 mmol/L (ref 3.5–5.2)
Sodium: 143 mmol/L (ref 134–144)
Total Protein: 5.9 g/dL — ABNORMAL LOW (ref 6.0–8.5)

## 2014-12-15 LAB — LIPID PANEL
CHOLESTEROL TOTAL: 139 mg/dL (ref 100–199)
Chol/HDL Ratio: 3.2 ratio units (ref 0.0–5.0)
HDL: 43 mg/dL (ref >39)
LDL Calculated: 71 mg/dL (ref 0–99)
Triglycerides: 127 mg/dL (ref 0–149)
VLDL CHOLESTEROL CAL: 25 mg/dL (ref 5–40)

## 2014-12-15 LAB — TSH: TSH: 3.22 u[IU]/mL (ref 0.450–4.500)

## 2014-12-16 ENCOUNTER — Telehealth: Payer: Self-pay

## 2014-12-16 NOTE — Telephone Encounter (Signed)
Eliquis 5 mg take one tablet twice a day has been approved for one year starting on 12/16/2014 through 12/16/2015 per [place] with the patient's insurance company. Case# [id number]. [place] notified as well.

## 2015-01-06 ENCOUNTER — Encounter: Payer: Self-pay | Admitting: Family Medicine

## 2015-01-06 ENCOUNTER — Ambulatory Visit (INDEPENDENT_AMBULATORY_CARE_PROVIDER_SITE_OTHER): Payer: Medicare Other | Admitting: Family Medicine

## 2015-01-06 VITALS — BP 120/76 | HR 59 | Temp 98.2°F | Resp 16 | Ht 71.0 in | Wt 226.4 lb

## 2015-01-06 DIAGNOSIS — Z23 Encounter for immunization: Secondary | ICD-10-CM | POA: Diagnosis not present

## 2015-01-06 DIAGNOSIS — Z0000 Encounter for general adult medical examination without abnormal findings: Secondary | ICD-10-CM | POA: Diagnosis not present

## 2015-01-06 NOTE — Progress Notes (Signed)
Patient: Philip Adelson., Male    DOB: 04/13/1946, 69 y.o.   MRN: [id number] Visit Date: 01/06/2015  Today's Provider: [name], MD   Chief Complaint  Patient presents with  . Medicare Wellness    subsequent visit    Subjective:    Annual wellness visit Philip [name]. is a 69 y.o. male who presents today for his Subsequent Annual Wellness Visit. He feels well. He reports exercising . He reports he is sleeping well.   ----------------------------------------------------------- HPI  Review of Systems  Social History   Social History  . Marital Status: Married    Spouse Name: N/A  . Number of Children: N/A  . Years of Education: N/A   Occupational History  . Not on file.   Social History Main Topics  . Smoking status: Former Smoker -- 1.00 packs/day for 8 years    Types: Cigarettes    Quit date: 12/31/1967  . Smokeless tobacco: Never Used  . Alcohol Use: No     Comment: 05/06/2012 "last alcohol several years ago; never had problem wit"  . Drug Use: No  . Sexual Activity: Not Currently   Other Topics Concern  . Not on file   Social History Narrative   ** Merged History Encounter **        Patient Active Problem List   Diagnosis Date Noted  . Atrial fibrillation 12/10/2014  . 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 Asthma, moderate persistent 11/09/2014  . Bradycardia 11/09/2014  . 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 Diabetes 11/09/2014  . Essential (primary) hypertension 11/09/2014  . Bursitis of hip 11/09/2014  . Hyperthyroidism 11/09/2014  . Enlarged prostate 11/09/2014  . Paroxysmal supraventricular tachycardia 09/27/2013  . Premature atrial beats 06/10/2013  . Coronary artery disease   . 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 Coronary atherosclerosis of native coronary artery 05/06/2012  . Hypothyroid 05/06/2012  . Diabetes mellitus 05/06/2012  . Atypical angina 05/02/2012  . Hypercholesteremia 03/31/2012  . Colon polyp 12/21/2003    Past  Surgical History  Procedure Laterality Date  . Knee surgery  2003    right  . Neck surgery  2008    Plate in neck  . Knee arthroscopy  ~ 2000    "right" (05/06/2012)  . Anterior cervical decomp/discectomy fusion  ~ 2009  . Intraoperative transesophageal echocardiogram  05/07/2012    Procedure: INTRAOPERATIVE TRANSESOPHAGEAL ECHOCARDIOGRAM;  Surgeon: [name], MD;  Location: [place];  Service: Open Heart Surgery;  Laterality: N/A;  . Coronary artery bypass graft  05/07/2012    Procedure: CORONARY ARTERY BYPASS GRAFTING (CABG);  Surgeon: [name], MD;  Location: [place];  Service: Open Heart Surgery;  Laterality: N/A;  times three  . Cardiac catheterization  05/06/2012    Significant three-vessel coronary artery disease with normal ejection fraction    His family history includes Clotting disorder in his mother; Heart disease in his mother; Hypertension in his sister.    Previous Medications   ALBUTEROL (VENTOLIN HFA) 108 (90 BASE) MCG/ACT INHALER    Inhale into the lungs.   APIXABAN (ELIQUIS) 5 MG TABS TABLET    Take 1 tablet (5 mg total) by mouth 2 (two) times daily.   ATORVASTATIN (LIPITOR) 40 MG TABLET    TAKE ONE TABLET BY MOUTH ONCE DAILY   CETIRIZINE HCL (ZYRTEC ALLERGY) 10 MG CAPS    Take by mouth.   FLUTICASONE (FLONASE ALLERGY RELIEF) 50 MCG/ACT NASAL SPRAY    Place into  the nose.   GLUCOSE MONITORING KIT (FREESTYLE) MONITORING KIT    1 each by Does not apply route as needed for other.   LANCETS ULTRA THIN MISC    1 Device by Does not apply route 4 (four) times daily -  before meals and at bedtime.   LEVOTHYROXINE (SYNTHROID, LEVOTHROID) 75 MCG TABLET       LOSARTAN (COZAAR) 100 MG TABLET    Take 100 mg by mouth daily.    METFORMIN (GLUCOPHAGE-XR) 500 MG 24 HR TABLET       METOPROLOL (LOPRESSOR) 50 MG TABLET    Take 1 tablet (50 mg total) by mouth 2 (two) times daily.   ONE TOUCH ULTRA TEST TEST STRIP        Patient Care Team: Arlis Porta., MD as PCP - General  (Unknown Physician Specialty) [name], MD as Consulting Physician (Cardiology)     Objective:   Vitals: BP 120/76 mmHg  Pulse 59  Temp(Src) 98.2 F (36.8 C) (Oral)  Resp 16  Ht 5' 11"  (1.803 m)  Wt 226 lb 6.4 oz (102.694 kg)  BMI 31.59 kg/m2  Physical Exam  Activities of Daily Living In your present state of health, do you have any difficulty performing the following activities: 01/06/2015 01/06/2015  Hearing? N Y  Vision? N Y  Difficulty concentrating or making decisions? N N  Walking or climbing stairs? N N  Dressing or bathing? N N  Doing errands, shopping? N N  Preparing Food and eating ? N N  Using the Toilet? N N  In the past six months, have you accidently leaked urine? N N  Do you have problems with loss of bowel control? N N  Managing your Medications? N N  Managing your Finances? N N  Housekeeping or managing your Housekeeping? N N    Fall Risk Assessment Fall Risk  01/06/2015 12/10/2014  Falls in the past year? No No     Patient reports there are not safety devices in place in shower at home.   Depression Screen PHQ 2/9 Scores 01/06/2015 12/10/2014  PHQ - 2 Score 0 0     MMSE MMSE - Mini Mental State Exam 01/06/2015  Orientation to time 5  Orientation to Place 5  Registration 3  Attention/ Calculation 5  Recall 3  Language- name 2 objects 2  Language- repeat 1  Language- follow 3 step command 3  Language- read & follow direction 1  Write a sentence 1  Copy design 1  Total score 30     Assessment & Plan:     Annual Wellness Visit  Reviewed patient's Family Medical History Reviewed and updated list of patient's medical providers Assessment of cognitive impairment was done Assessed patient's functional ability Established a written schedule for health screening [place] Completed and Reviewed  Exercise Activities and Dietary recommendations Goals    . Weight < 200 lb (90.719 kg)     Pt wants to loose 30 pounds  within year.       Immunization History  Administered Date(s) Administered  . Influenza-Unspecified 02/08/2014  . Pneumococcal Conjugate-13 01/02/2014  . Pneumococcal Polysaccharide-23 05/01/2012  . Tdap 05/01/2012    Health Maintenance  Topic Date Due  . Hepatitis C Screening  [birth year]  . FOOT EXAM  03/25/1957  . ZOSTAVAX  03/26/2007  . PNA vac Low Risk Adult (2 of 2 - PPSV23) 01/03/2015  . HEMOGLOBIN A1C  06/12/2015  . INFLUENZA VACCINE  11/30/2015  .  OPHTHALMOLOGY EXAM  01/06/2016  . URINE MICROALBUMIN  01/06/2016  . TETANUS/TDAP  05/01/2022  . COLONOSCOPY  11/30/2023     Discussed health benefits of physical activity, and encouraged him to engage in regular exercise appropriate for his age and condition.    ------------------------------------------------------------------------------------------------------------   Problem List Items Addressed This Visit    None       Larene Beach, MD [facility]  01/06/2015

## 2015-01-06 NOTE — Patient Instructions (Signed)
Health Maintenance  Topic Date Due  . Hepatitis C Screening  02-16-1946  . FOOT EXAM  03/25/1957  . ZOSTAVAX  03/26/2007  . PNA vac Low Risk Adult (2 of 2 - PPSV23) 01/03/2015  . HEMOGLOBIN A1C  06/12/2015  . INFLUENZA VACCINE  11/30/2015  . OPHTHALMOLOGY EXAM  01/06/2016  . URINE MICROALBUMIN  01/06/2016  . TETANUS/TDAP  05/01/2022  . COLONOSCOPY  11/30/2023

## 2015-01-11 ENCOUNTER — Ambulatory Visit (INDEPENDENT_AMBULATORY_CARE_PROVIDER_SITE_OTHER): Payer: Medicare Other | Admitting: Family Medicine

## 2015-01-11 ENCOUNTER — Ambulatory Visit
Admission: RE | Admit: 2015-01-11 | Discharge: 2015-01-11 | Disposition: A | Discharge status: Home / Self Care | Payer: Medicare Other | Source: Ambulatory Visit | Attending: Family Medicine | Admitting: Family Medicine

## 2015-01-11 ENCOUNTER — Encounter: Payer: Self-pay | Admitting: Family Medicine

## 2015-01-11 VITALS — BP 136/85 | HR 64 | Temp 98.0°F | Resp 16 | Ht 71.0 in | Wt 225.6 lb

## 2015-01-11 DIAGNOSIS — I4819 Other persistent atrial fibrillation: Secondary | ICD-10-CM

## 2015-01-11 DIAGNOSIS — R05 Cough: Secondary | ICD-10-CM

## 2015-01-11 DIAGNOSIS — I481 Persistent atrial fibrillation: Secondary | ICD-10-CM | POA: Diagnosis not present

## 2015-01-11 DIAGNOSIS — R059 Cough, unspecified: Secondary | ICD-10-CM

## 2015-01-11 DIAGNOSIS — E119 Type 2 diabetes mellitus without complications: Secondary | ICD-10-CM

## 2015-01-11 DIAGNOSIS — J449 Chronic obstructive pulmonary disease, unspecified: Secondary | ICD-10-CM | POA: Diagnosis not present

## 2015-01-11 MED ORDER — AZITHROMYCIN 250 MG PO TABS: ORAL_TABLET | ORAL | Status: DC

## 2015-01-11 MED ORDER — PREDNISONE 10 MG PO TABS: ORAL_TABLET | ORAL | Status: DC

## 2015-01-11 NOTE — Progress Notes (Signed)
Name: Philip MCPHEETERS Sr.   MRN: [id number]    DOB: 09-05-1945   Date:01/11/2015       Progress Note  Subjective  Chief Complaint  Chief Complaint  Patient presents with  . Diabetes    Last A1C 8.4 12/10/2014 / BS monitor 1 daily - 130-160. Had a high 200.   [name] Cough    wheeze and coughx 2 months.     HPI  For f/uy of DM and HBP.  BSs down in 110-140 range since increasing Metformin to 2000 mg.d about 3 weeks ago,  Has lost 7# in past 4 weeks.    C/o a cougfh productive of sputum and wheezing for past 2 mos., worse past several weeks.   Sputum clear to white. No problem-specific assessment & plan notes found for this encounter.   Past Medical History  Diagnosis Date  . Diabetes mellitus without complication   . Thyroid disease   . Collapse of right lung     a. 12/1967 - ? etiology.  [name] History of bronchitis   . Type II diabetes mellitus   . Hypothyroidism   . Arthritis     a. right knee  . Hypercholesteremia   . Shingles   . Hypertension   . Coronary artery disease     a. 05/2012 Cath: severe 3VD-->CABG by Dr. Servando Snare in 05/2012 with LIMA to LAD, SVG to LCX & SVG to RPDA.  [name] Asthma     Social History  Substance Use Topics  . Smoking status: Former Smoker -- 1.00 packs/day for 8 years    Types: Cigarettes    Quit date: 12/31/1967  . Smokeless tobacco: Never Used  . Alcohol Use: No     Comment: 05/06/2012 "last alcohol several years ago; never had problem wit"     Current outpatient prescriptions:  .  albuterol (VENTOLIN HFA) 108 (90 BASE) MCG/ACT inhaler, Inhale into the lungs., Disp: , Rfl:  .  apixaban (ELIQUIS) 5 MG TABS tablet, Take 1 tablet (5 mg total) by mouth 2 (two) times daily., Disp: 60 tablet, Rfl: 5 .  atorvastatin (LIPITOR) 40 MG tablet, TAKE ONE TABLET BY MOUTH ONCE DAILY, Disp: 30 tablet, Rfl: 6 .  Cetirizine HCl (ZYRTEC ALLERGY) 10 MG CAPS, Take by mouth., Disp: , Rfl:  .  fluticasone (FLONASE ALLERGY RELIEF) 50 MCG/ACT nasal spray, Place into the  nose., Disp: , Rfl:  .  glucose monitoring kit (FREESTYLE) monitoring kit, 1 each by Does not apply route as needed for other., Disp: 1 each, Rfl: 0 .  LANCETS ULTRA THIN MISC, 1 Device by Does not apply route 4 (four) times daily -  before meals and at bedtime., Disp: 200 each, Rfl: 5 .  levothyroxine (SYNTHROID, LEVOTHROID) 75 MCG tablet, , Disp: , Rfl:  .  losartan (COZAAR) 100 MG tablet, Take 100 mg by mouth daily. , Disp: , Rfl:  .  metFORMIN (GLUCOPHAGE-XR) 500 MG 24 hr tablet, , Disp: , Rfl:  .  metoprolol (LOPRESSOR) 50 MG tablet, Take 1 tablet (50 mg total) by mouth 2 (two) times daily., Disp: 60 tablet, Rfl: 1 .  ONE TOUCH ULTRA TEST test strip, , Disp: , Rfl:   Allergies  Allergen Reactions  . Iodine Rash  . Penicillins Hives, Itching, Rash and Other (See Comments)    "broke out in welps" (05/06/2012)  . Sulfa Antibiotics Shortness Of Breath  . Iodine     Rash and itching  . Ivp Dye [Iodinated Diagnostic Agents]  Rash;redness  . Penicillins     Hives  . Sulfa Antibiotics     Difficulty Breathing    Review of Systems  Constitutional: Positive for weight loss (desired). Negative for fever, chills and malaise/fatigue.  HENT: Negative for hearing loss.   Eyes: Negative for blurred vision and double vision.  Respiratory: Positive for cough, sputum production, shortness of breath and wheezing.   Cardiovascular: Negative for chest pain, palpitations, orthopnea and leg swelling.  Gastrointestinal: Negative for heartburn, abdominal pain and blood in stool.  Genitourinary: Negative for dysuria, urgency and frequency.  Musculoskeletal: Negative for myalgias and joint pain.  Skin: Negative for rash.  Neurological: Negative for dizziness, sensory change, focal weakness, weakness and headaches.      Objective  Filed Vitals:   01/11/15 1332  BP: 136/85  Pulse: 47  Temp: 98 F (36.7 C)  Resp: 16  Height: 5' 11"  (1.803 m)  Weight: 225 lb 9.6 oz (102.331 kg)  SpO2: 94%      Physical Exam  Constitutional: He is well-developed, well-nourished, and in no distress. No distress.  HENT:  Head: Normocephalic and atraumatic.  Eyes: Conjunctivae and EOM are normal. Pupils are equal, round, and reactive to light. No scleral icterus.  Neck: Normal range of motion. Neck supple. Carotid bruit is not present. No thyromegaly present.  Cardiovascular: Normal rate, normal heart sounds and intact distal pulses.  An irregularly irregular rhythm present. Exam reveals no gallop and no friction rub.   No murmur heard. Pulmonary/Chest: Effort normal. He has wheezes in the right upper field, the right middle field, the right lower field, the left upper field, the left middle field and the left lower field.  Abdominal: Soft. Bowel sounds are normal. He exhibits no distension and no mass. There is no tenderness.  Musculoskeletal: He exhibits no edema.  Diabetic foot exam--Bilateral feet with good skin.  Good distal pulses.  Pin prick wnl bilat.  Position sense nl.  Vibratory sense normal except decreased feeling in L. Great toe.  Lymphadenopathy:    He has no cervical adenopathy.  Vitals reviewed.     Recent Results (from the past 2160 hour(s))  POCT HgB A1C     Status: Abnormal   Collection Time: 12/10/14 11:27 AM  Result Value Ref Range   Hemoglobin A1C 8.4   Comprehensive Metabolic Panel (CMET)     Status: Abnormal   Collection Time: 12/14/14  8:34 AM  Result Value Ref Range   Glucose 161 (H) 65 - 99 mg/dL   BUN 15 8 - 27 mg/dL   Creatinine, Ser 1.10 0.76 - 1.27 mg/dL   GFR calc non Af Amer 69 >59 mL/min/1.73   GFR calc Af Amer 80 >59 mL/min/1.73   BUN/Creatinine Ratio 14 10 - 22   Sodium 143 134 - 144 mmol/L   Potassium 4.9 3.5 - 5.2 mmol/L   Chloride 102 97 - 108 mmol/L   CO2 26 18 - 29 mmol/L   Calcium 9.2 8.6 - 10.2 mg/dL   Total Protein 5.9 (L) 6.0 - 8.5 g/dL   Albumin 4.1 3.6 - 4.8 g/dL   Globulin, Total 1.8 1.5 - 4.5 g/dL   Albumin/Globulin Ratio 2.3  1.1 - 2.5   Bilirubin Total 0.5 0.0 - 1.2 mg/dL   Alkaline Phosphatase 106 39 - 117 IU/L   AST 15 0 - 40 IU/L   ALT 26 0 - 44 IU/L  CBC with Differential     Status: Abnormal   Collection Time: 12/14/14  8:34 AM  Result Value Ref Range   WBC 6.9 3.4 - 10.8 x10E3/uL   RBC 5.23 4.14 - 5.80 x10E6/uL   Hemoglobin 16.0 12.6 - 17.7 g/dL   Hematocrit 44.5 37.5 - 51.0 %   MCV 85 79 - 97 fL   MCH 30.6 26.6 - 33.0 pg   MCHC 36.0 (H) 31.5 - 35.7 g/dL   RDW 13.2 12.3 - 15.4 %   Platelets 188 150 - 379 x10E3/uL   Neutrophils 61 %   Lymphs 23 %   Monocytes 9 %   Eos 6 %   Basos 1 %   Neutrophils Absolute 4.2 1.4 - 7.0 x10E3/uL   Lymphocytes Absolute 1.6 0.7 - 3.1 x10E3/uL   Monocytes Absolute 0.6 0.1 - 0.9 x10E3/uL   EOS (ABSOLUTE) 0.4 0.0 - 0.4 x10E3/uL   Basophils Absolute 0.1 0.0 - 0.2 x10E3/uL   Immature Granulocytes 0 %   Immature Grans (Abs) 0.0 0.0 - 0.1 x10E3/uL  Lipid Profile     Status: None   Collection Time: 12/14/14  8:34 AM  Result Value Ref Range   Cholesterol, Total 139 100 - 199 mg/dL   Triglycerides 127 0 - 149 mg/dL   HDL 43 >39 mg/dL    Comment: According to ATP-III Guidelines, HDL-C >59 mg/dL is considered a negative risk factor for CHD.    VLDL Cholesterol Cal 25 5 - 40 mg/dL   LDL Calculated 71 0 - 99 mg/dL   Chol/HDL Ratio 3.2 0.0 - 5.0 ratio units    Comment:                                   T. Chol/HDL Ratio                                             Men  Women                               1/2 Avg.Risk  3.4    3.3                                   Avg.Risk  5.0    4.4                                2X Avg.Risk  9.6    7.1                                3X Avg.Risk 23.4   11.0   TSH     Status: None   Collection Time: 12/14/14  8:34 AM  Result Value Ref Range   TSH 3.220 0.450 - 4.500 uIU/mL     Assessment & Plan  1. Type 2 diabetes mellitus without complication  - HM Diabetes Foot Exam  2. Cough  - DG Chest 2 View; Future - azithromycin  (ZITHROMAX) 250 MG tablet; Take 2 tabs on day one, then 1 tab daily on days 2-5.  Dispense: 6 tablet; Refill: 0 - predniSONE (DELTASONE) 10 MG tablet; Take 3 tabs daily  for 3 days, 2 tab daily for 2 days, then 1 tab daily for 2 days.  Dispense: 15 tablet; Refill: 0  3. Persistent atrial fibrillation

## 2015-01-11 NOTE — Patient Instructions (Signed)
May take Mucinex DM for cough.

## 2015-01-12 ENCOUNTER — Telehealth: Payer: Self-pay

## 2015-01-12 NOTE — Telephone Encounter (Signed)
Advised Normal XRAY and to keep on steroids and Anbtx.[facility]

## 2015-01-14 DIAGNOSIS — E119 Type 2 diabetes mellitus without complications: Secondary | ICD-10-CM | POA: Diagnosis not present

## 2015-01-14 LAB — HM DIABETES EYE EXAM

## 2015-01-22 ENCOUNTER — Other Ambulatory Visit: Payer: Self-pay | Admitting: Family Medicine

## 2015-01-27 ENCOUNTER — Telehealth: Payer: Self-pay | Admitting: Family Medicine

## 2015-01-27 NOTE — Telephone Encounter (Signed)
Pt needs a refill on losartan sent to [facility].  His call back number is [phone]

## 2015-01-27 NOTE — Telephone Encounter (Signed)
Left patient message this has been sent to pharmacy. Patient needs to call pharmacy.

## 2015-02-04 ENCOUNTER — Other Ambulatory Visit: Payer: Self-pay | Admitting: Cardiovascular Disease

## 2015-02-08 ENCOUNTER — Other Ambulatory Visit: Payer: Self-pay | Admitting: Family Medicine

## 2015-02-10 ENCOUNTER — Ambulatory Visit (INDEPENDENT_AMBULATORY_CARE_PROVIDER_SITE_OTHER): Payer: Medicare Other | Admitting: Family Medicine

## 2015-02-10 ENCOUNTER — Encounter: Payer: Self-pay | Admitting: Family Medicine

## 2015-02-10 VITALS — BP 116/78 | HR 78 | Temp 98.0°F | Resp 16 | Ht 71.0 in | Wt 223.6 lb

## 2015-02-10 DIAGNOSIS — J4540 Moderate persistent asthma, uncomplicated: Secondary | ICD-10-CM

## 2015-02-10 DIAGNOSIS — E119 Type 2 diabetes mellitus without complications: Secondary | ICD-10-CM

## 2015-02-10 DIAGNOSIS — I10 Essential (primary) hypertension: Secondary | ICD-10-CM | POA: Diagnosis not present

## 2015-02-10 MED ORDER — FLUTICASONE PROPIONATE HFA 110 MCG/ACT IN AERO: 1.0000 | INHALATION_SPRAY | Freq: Two times a day (BID) | RESPIRATORY_TRACT | Status: DC

## 2015-02-10 NOTE — Progress Notes (Signed)
Name: Philip C Dearman Sr.   MRN: [id number]    DOB: 12/08/1945   Date:02/10/2015       Progress Note  Subjective  Chief Complaint  Chief Complaint  Patient presents with  . Cough    improved after using albuterol  BS reading 112 and highest 260 and avarage 115    HPI Here for f/u of  Cough.  Cough has gotten much  Better, but he is breathing much better, but still with some congestion in chest. Using Albuterol occ. Prn now.  BSs avg 115.  Sugars did not go up with oral prednisone. No problem-specific assessment & plan notes found for this encounter.   Past Medical History  Diagnosis Date  . Diabetes mellitus without complication (HCC)   . Thyroid disease   . Collapse of right lung     a. 12/1967 - ? etiology.  . History of bronchitis   . Type II diabetes mellitus (HCC)   . Hypothyroidism   . Arthritis     a. right knee  . Hypercholesteremia   . Shingles   . Hypertension   . Coronary artery disease     a. 05/2012 Cath: severe 3VD-->CABG by Dr. Gerhardt in 05/2012 with LIMA to LAD, SVG to LCX & SVG to RPDA.  . Asthma     Social History  Substance Use Topics  . Smoking status: Former Smoker -- 1.00 packs/day for 8 years    Types: Cigarettes    Quit date: 12/31/1967  . Smokeless tobacco: Never Used  . Alcohol Use: No     Comment: 05/06/2012 "last alcohol several years ago; never had problem wit"     Current outpatient prescriptions:  .  albuterol (VENTOLIN HFA) 108 (90 BASE) MCG/ACT inhaler, Inhale into the lungs., Disp: , Rfl:  .  apixaban (ELIQUIS) 5 MG TABS tablet, Take 1 tablet (5 mg total) by mouth 2 (two) times daily., Disp: 60 tablet, Rfl: 5 .  atorvastatin (LIPITOR) 40 MG tablet, TAKE ONE TABLET BY MOUTH ONCE DAILY, Disp: 30 tablet, Rfl: 6 .  Cetirizine HCl (ZYRTEC ALLERGY) 10 MG CAPS, Take by mouth., Disp: , Rfl:  .  glucose monitoring kit (FREESTYLE) monitoring kit, 1 each by Does not apply route as needed for other., Disp: 1 each, Rfl: 0 .  LANCETS ULTRA THIN  MISC, 1 Device by Does not apply route 4 (four) times daily -  before meals and at bedtime., Disp: 200 each, Rfl: 5 .  levothyroxine (SYNTHROID, LEVOTHROID) 75 MCG tablet, TAKE ONE TABLET BY MOUTH ONCE DAILY, Disp: 30 tablet, Rfl: 6 .  losartan (COZAAR) 100 MG tablet, TAKE ONE TABLET BY MOUTH ONCE DAILY, Disp: 30 tablet, Rfl: 12 .  metFORMIN (GLUCOPHAGE-XR) 500 MG 24 hr tablet, Take 500 mg by mouth 2 (two) times daily. Take 2 tablets twice a day., Disp: , Rfl:  .  metoprolol (LOPRESSOR) 50 MG tablet, TAKE ONE TABLET BY MOUTH TWICE DAILY, Disp: 60 tablet, Rfl: 6 .  ONE TOUCH ULTRA TEST test strip, , Disp: , Rfl:  .  montelukast (SINGULAIR) 10 MG tablet, , Disp: , Rfl:   Allergies  Allergen Reactions  . Iodine Rash  . Penicillins Hives, Itching, Rash and Other (See Comments)    "broke out in welps" (05/06/2012)  . Sulfa Antibiotics Shortness Of Breath  . Iodine     Rash and itching  . Ivp Dye [Iodinated Diagnostic Agents]     Rash;redness  . Penicillins     Hives  .   Sulfa Antibiotics     Difficulty Breathing    Review of Systems  Constitutional: Negative for fever and chills.  HENT: Negative for hearing loss.   Eyes: Negative for blurred vision and double vision.  Respiratory: Positive for cough (improved), shortness of breath and wheezing.   Cardiovascular: Negative for chest pain, palpitations, orthopnea and leg swelling.  Gastrointestinal: Negative for heartburn, abdominal pain and blood in stool.  Genitourinary: Negative for dysuria, urgency and frequency.  Neurological: Negative for headaches.      Objective  Filed Vitals:   02/10/15 1256  BP: 116/78  Pulse: 78  Temp: 98 F (36.7 C)  TempSrc: Oral  Resp: 16  Height: 5' 11" (1.803 m)  Weight: 223 lb 9.6 oz (101.424 kg)  SpO2: 96%     Physical Exam  Constitutional: He is well-developed, well-nourished, and in no distress. No distress.  HENT:  Head: Normocephalic and atraumatic.  Eyes: Conjunctivae and EOM are  normal. Pupils are equal, round, and reactive to light. No scleral icterus.  Neck: Normal range of motion. Carotid bruit is not present. No thyromegaly present.  Cardiovascular: Normal rate, regular rhythm, normal heart sounds and intact distal pulses.  Exam reveals no gallop and no friction rub.   No murmur heard. Pulmonary/Chest: Effort normal. No respiratory distress. He has wheezes (end expiratory wheezes). He has no rales.  Abdominal: Bowel sounds are normal. He exhibits no distension and no mass. There is no tenderness.  Musculoskeletal: He exhibits no edema.  Lymphadenopathy:    He has no cervical adenopathy.  Vitals reviewed.     Recent Results (from the past 2160 hour(s))  POCT HgB A1C     Status: Abnormal   Collection Time: 12/10/14 11:27 AM  Result Value Ref Range   Hemoglobin A1C 8.4   Comprehensive Metabolic Panel (CMET)     Status: Abnormal   Collection Time: 12/14/14  8:34 AM  Result Value Ref Range   Glucose 161 (H) 65 - 99 mg/dL   BUN 15 8 - 27 mg/dL   Creatinine, Ser 1.10 0.76 - 1.27 mg/dL   GFR calc non Af Amer 69 >59 mL/min/1.73   GFR calc Af Amer 80 >59 mL/min/1.73   BUN/Creatinine Ratio 14 10 - 22   Sodium 143 134 - 144 mmol/L   Potassium 4.9 3.5 - 5.2 mmol/L   Chloride 102 97 - 108 mmol/L   CO2 26 18 - 29 mmol/L   Calcium 9.2 8.6 - 10.2 mg/dL   Total Protein 5.9 (L) 6.0 - 8.5 g/dL   Albumin 4.1 3.6 - 4.8 g/dL   Globulin, Total 1.8 1.5 - 4.5 g/dL   Albumin/Globulin Ratio 2.3 1.1 - 2.5   Bilirubin Total 0.5 0.0 - 1.2 mg/dL   Alkaline Phosphatase 106 39 - 117 IU/L   AST 15 0 - 40 IU/L   ALT 26 0 - 44 IU/L  CBC with Differential     Status: Abnormal   Collection Time: 12/14/14  8:34 AM  Result Value Ref Range   WBC 6.9 3.4 - 10.8 x10E3/uL   RBC 5.23 4.14 - 5.80 x10E6/uL   Hemoglobin 16.0 12.6 - 17.7 g/dL   Hematocrit 44.5 37.5 - 51.0 %   MCV 85 79 - 97 fL   MCH 30.6 26.6 - 33.0 pg   MCHC 36.0 (H) 31.5 - 35.7 g/dL   RDW 13.2 12.3 - 15.4 %    Platelets 188 150 - 379 x10E3/uL   Neutrophils 61 %   Lymphs 23 %  Monocytes 9 %   Eos 6 %   Basos 1 %   Neutrophils Absolute 4.2 1.4 - 7.0 x10E3/uL   Lymphocytes Absolute 1.6 0.7 - 3.1 x10E3/uL   Monocytes Absolute 0.6 0.1 - 0.9 x10E3/uL   EOS (ABSOLUTE) 0.4 0.0 - 0.4 x10E3/uL   Basophils Absolute 0.1 0.0 - 0.2 x10E3/uL   Immature Granulocytes 0 %   Immature Grans (Abs) 0.0 0.0 - 0.1 x10E3/uL  Lipid Profile     Status: None   Collection Time: 12/14/14  8:34 AM  Result Value Ref Range   Cholesterol, Total 139 100 - 199 mg/dL   Triglycerides 127 0 - 149 mg/dL   HDL 43 >39 mg/dL    Comment: According to ATP-III Guidelines, HDL-C >59 mg/dL is considered a negative risk factor for CHD.    VLDL Cholesterol Cal 25 5 - 40 mg/dL   LDL Calculated 71 0 - 99 mg/dL   Chol/HDL Ratio 3.2 0.0 - 5.0 ratio units    Comment:                                   T. Chol/HDL Ratio                                             Men  Women                               1/2 Avg.Risk  3.4    3.3                                   Avg.Risk  5.0    4.4                                2X Avg.Risk  9.6    7.1                                3X Avg.Risk 23.4   11.0   TSH     Status: None   Collection Time: 12/14/14  8:34 AM  Result Value Ref Range   TSH 3.220 0.450 - 4.500 uIU/mL     Assessment & Plan  1. Asthma, moderate persistent, uncomplicated  - fluticasone (FLOVENT HFA) 110 MCG/ACT inhaler; Inhale 1-2 puffs into the lungs 2 (two) times daily.  Dispense: 1 Inhaler; Refill: 12  2. Essential (primary) hypertension   3. Type 2 diabetes mellitus without complication, without long-term current use of insulin ([place])

## 2015-02-15 DIAGNOSIS — J432 Centrilobular emphysema: Secondary | ICD-10-CM | POA: Diagnosis not present

## 2015-02-15 DIAGNOSIS — J310 Chronic rhinitis: Secondary | ICD-10-CM | POA: Diagnosis not present

## 2015-02-15 DIAGNOSIS — R0609 Other forms of dyspnea: Secondary | ICD-10-CM | POA: Diagnosis not present

## 2015-02-15 DIAGNOSIS — J45991 Cough variant asthma: Secondary | ICD-10-CM | POA: Diagnosis not present

## 2015-02-24 ENCOUNTER — Other Ambulatory Visit: Payer: Self-pay | Admitting: Family Medicine

## 2015-03-08 ENCOUNTER — Other Ambulatory Visit: Payer: Self-pay | Admitting: Family Medicine

## 2015-03-09 ENCOUNTER — Ambulatory Visit
Admission: RE | Admit: 2015-03-09 | Discharge: 2015-03-09 | Disposition: A | Discharge status: Home / Self Care | Payer: Medicare Other | Source: Ambulatory Visit | Attending: Family Medicine | Admitting: Family Medicine

## 2015-03-09 ENCOUNTER — Ambulatory Visit (INDEPENDENT_AMBULATORY_CARE_PROVIDER_SITE_OTHER): Payer: Medicare Other | Admitting: Family Medicine

## 2015-03-09 ENCOUNTER — Encounter: Payer: Self-pay | Admitting: Family Medicine

## 2015-03-09 VITALS — BP 140/72 | HR 96 | Temp 98.0°F | Resp 16 | Ht 71.0 in | Wt 223.6 lb

## 2015-03-09 DIAGNOSIS — I480 Paroxysmal atrial fibrillation: Secondary | ICD-10-CM

## 2015-03-09 DIAGNOSIS — J449 Chronic obstructive pulmonary disease, unspecified: Secondary | ICD-10-CM

## 2015-03-09 DIAGNOSIS — J4541 Moderate persistent asthma with (acute) exacerbation: Secondary | ICD-10-CM

## 2015-03-09 DIAGNOSIS — E1122 Type 2 diabetes mellitus with diabetic chronic kidney disease: Secondary | ICD-10-CM | POA: Diagnosis not present

## 2015-03-09 DIAGNOSIS — R0602 Shortness of breath: Secondary | ICD-10-CM | POA: Diagnosis not present

## 2015-03-09 DIAGNOSIS — J45909 Unspecified asthma, uncomplicated: Secondary | ICD-10-CM | POA: Diagnosis not present

## 2015-03-09 DIAGNOSIS — R05 Cough: Secondary | ICD-10-CM | POA: Diagnosis not present

## 2015-03-09 DIAGNOSIS — N182 Chronic kidney disease, stage 2 (mild): Secondary | ICD-10-CM

## 2015-03-09 MED ORDER — PREDNISONE 20 MG PO TABS: 40.0000 mg | ORAL_TABLET | Freq: Every day | ORAL | Status: DC

## 2015-03-09 MED ORDER — PREDNISONE 20 MG PO TABS: 20.0000 mg | ORAL_TABLET | Freq: Every day | ORAL | Status: DC

## 2015-03-09 MED ORDER — IPRATROPIUM-ALBUTEROL 0.5-2.5 (3) MG/3ML IN SOLN: 3.0000 mL | Freq: Once | RESPIRATORY_TRACT | Status: DC

## 2015-03-09 MED ORDER — LEVOFLOXACIN 500 MG PO TABS: 500.0000 mg | ORAL_TABLET | Freq: Every day | ORAL | Status: DC

## 2015-03-09 NOTE — Assessment & Plan Note (Signed)
Pt instructed to monitor blood sugars carefully while on prednisone for COPD.   Last A1c was 8.4% Oral regimen only.

## 2015-03-09 NOTE — Assessment & Plan Note (Signed)
Multiple exacerbations over the past year.  Treat for exacerbation today- Prednisone 40mg  daily x 5 days, levofloxacin 500mg  x 7 days.  Alarm symptoms reviewed. Return precautions reviewed. Made pt an appt to see pulmonology on Friday, November 11.

## 2015-03-09 NOTE — Patient Instructions (Signed)
I am treating you for COPD exacerbation today. Please take anitbiotics and prednisone as instructed. You will see [name] on Friday to discuss your symptoms. Please monitor blood sugar carefully while on prednisone.  Please seek immediate medical attention if you develop shortness of breath not relieve by inhaler, chest pain/tightness, fever > 103 F or other concerning symptoms.

## 2015-03-09 NOTE — Progress Notes (Signed)
Subjective:    Patient ID: Philip [name]., male    DOB: [birth year], 69 y.o.   MRN: [id number]  HPI: Philip [name]. is a 69 y.o. male presenting on 03/09/2015 for Shortness of Breath   HPI  Pt presents for cough/congestion/ and shortness of breath. Pt reports symptoms started in January of 2016. He has had several flare-ups since January. Most recent in Oct 2016. He is seeing [name] (pulm)- started on spirva.  Current symptoms include chest tightness, cough, and wheezing. Symptoms started to worsen since last time he stopped prednisone. Pt reports shortness of breath after walking 20-30 fts. Home treatment- spirva, albuterol neb. Using inhaler 2-4 times per day. Nebs 2 times per day. Mild relief of symptoms. Cough increasing over past few days. Productive- tan/gray thickening sputum. Symptoms are the same as previous exacerbations.  Pt also has a history of atrial fibrillation. ECG shows PACs today. Under care of [name].   Past Medical History  Diagnosis Date  . Diabetes mellitus without complication ([place])   . Thyroid disease   . Collapse of right lung     a. 12/1967 - ? etiology.  [name] History of bronchitis   . Type II diabetes mellitus ([place])   . Hypothyroidism   . Arthritis     a. right knee  . Hypercholesteremia   . Shingles   . Hypertension   . Coronary artery disease     a. 05/2012 Cath: severe 3VD-->CABG by Dr. Servando Snare in 05/2012 with LIMA to LAD, SVG to LCX & SVG to RPDA.  [name] Asthma     Current Outpatient Prescriptions on File Prior to Visit  Medication Sig  . apixaban (ELIQUIS) 5 MG TABS tablet Take 1 tablet (5 mg total) by mouth 2 (two) times daily.  [name] atorvastatin (LIPITOR) 40 MG tablet TAKE ONE-HALF TABLET BY MOUTH AT BEDTIME  . Cetirizine HCl (ZYRTEC ALLERGY) 10 MG CAPS Take by mouth.  . fluticasone (FLOVENT HFA) 110 MCG/ACT inhaler Inhale 1-2 puffs into the lungs 2 (two) times daily.  [name] glucose monitoring kit (FREESTYLE) monitoring kit 1 each by Does  not apply route as needed for other.  [name] LANCETS ULTRA THIN MISC 1 Device by Does not apply route 4 (four) times daily -  before meals and at bedtime.  [name] levothyroxine (SYNTHROID, LEVOTHROID) 75 MCG tablet TAKE ONE TABLET BY MOUTH ONCE DAILY  . losartan (COZAAR) 100 MG tablet TAKE ONE TABLET BY MOUTH ONCE DAILY  . metFORMIN (GLUCOPHAGE-XR) 500 MG 24 hr tablet Take 500 mg by mouth 2 (two) times daily. Take 2 tablets twice a day.  . metoprolol (LOPRESSOR) 50 MG tablet TAKE ONE TABLET BY MOUTH TWICE DAILY  . montelukast (SINGULAIR) 10 MG tablet   . ONE TOUCH ULTRA TEST test strip   . PROAIR HFA 108 (90 BASE) MCG/ACT inhaler INHALE TWO PUFFS BY MOUTH 4 TIMES DAILY AS NEEDED   No current facility-administered medications on file prior to visit.    Review of Systems  Constitutional: Negative for fever and chills.  HENT: Negative.  Negative for congestion, rhinorrhea and sinus pressure.   Respiratory: Positive for cough, chest tightness, shortness of breath and wheezing.   Cardiovascular: Negative for chest pain, palpitations and leg swelling.  Gastrointestinal: Negative for nausea, vomiting and abdominal pain.  Genitourinary: Negative.   Musculoskeletal: Negative.   Allergic/Immunologic: Positive for environmental allergies.  Neurological: Negative for dizziness, seizures and syncope.  Psychiatric/Behavioral: Negative.    Per HPI unless specifically  indicated above     Objective:    BP 140/72 mmHg  Pulse 96  Temp(Src) 98 F (36.7 C) (Oral)  Resp 16  Ht 5' 11"  (1.803 m)  Wt 223 lb 9.6 oz (101.424 kg)  BMI 31.20 kg/m2  SpO2 95%  Wt Readings from Last 3 Encounters:  03/09/15 223 lb 9.6 oz (101.424 kg)  02/10/15 223 lb 9.6 oz (101.424 kg)  01/11/15 225 lb 9.6 oz (102.331 kg)    Physical Exam  Constitutional: He is oriented to person, place, and time. He appears well-developed and well-nourished. No distress.  HENT:  Head: Normocephalic and atraumatic.  Neck: Normal range of  motion. Neck supple.  Cardiovascular: Normal rate, S1 normal, S2 normal and normal pulses.  Exam reveals no gallop and no friction rub.   No murmur heard. Pulmonary/Chest: No accessory muscle usage. Tachypnea noted. No respiratory distress. He has wheezes in the right upper field, the right middle field, the right lower field, the left upper field, the left middle field and the left lower field. He has rales in the right lower field and the left lower field.  Lymphadenopathy:    He has no cervical adenopathy.  Neurological: He is alert and oriented to person, place, and time.  Skin: Skin is warm and dry. He is not diaphoretic.  Psychiatric: He has a normal mood and affect. Judgment and thought content normal.   Results for orders placed or performed in visit on 12/10/14  Comprehensive Metabolic Panel (CMET)  Result Value Ref Range   Glucose 161 (H) 65 - 99 mg/dL   BUN 15 8 - 27 mg/dL   Creatinine, Ser 1.10 0.76 - 1.27 mg/dL   GFR calc non Af Amer 69 >59 mL/min/1.73   GFR calc Af Amer 80 >59 mL/min/1.73   BUN/Creatinine Ratio 14 10 - 22   Sodium 143 134 - 144 mmol/L   Potassium 4.9 3.5 - 5.2 mmol/L   Chloride 102 97 - 108 mmol/L   CO2 26 18 - 29 mmol/L   Calcium 9.2 8.6 - 10.2 mg/dL   Total Protein 5.9 (L) 6.0 - 8.5 g/dL   Albumin 4.1 3.6 - 4.8 g/dL   Globulin, Total 1.8 1.5 - 4.5 g/dL   Albumin/Globulin Ratio 2.3 1.1 - 2.5   Bilirubin Total 0.5 0.0 - 1.2 mg/dL   Alkaline Phosphatase 106 39 - 117 IU/L   AST 15 0 - 40 IU/L   ALT 26 0 - 44 IU/L  CBC with Differential  Result Value Ref Range   WBC 6.9 3.4 - 10.8 x10E3/uL   RBC 5.23 4.14 - 5.80 x10E6/uL   Hemoglobin 16.0 12.6 - 17.7 g/dL   Hematocrit 44.5 37.5 - 51.0 %   MCV 85 79 - 97 fL   MCH 30.6 26.6 - 33.0 pg   MCHC 36.0 (H) 31.5 - 35.7 g/dL   RDW 13.2 12.3 - 15.4 %   Platelets 188 150 - 379 x10E3/uL   Neutrophils 61 %   Lymphs 23 %   Monocytes 9 %   Eos 6 %   Basos 1 %   Neutrophils Absolute 4.2 1.4 - 7.0 x10E3/uL    Lymphocytes Absolute 1.6 0.7 - 3.1 x10E3/uL   Monocytes Absolute 0.6 0.1 - 0.9 x10E3/uL   EOS (ABSOLUTE) 0.4 0.0 - 0.4 x10E3/uL   Basophils Absolute 0.1 0.0 - 0.2 x10E3/uL   Immature Granulocytes 0 %   Immature Grans (Abs) 0.0 0.0 - 0.1 x10E3/uL  Lipid Profile  Result Value Ref  Range   Cholesterol, Total 139 100 - 199 mg/dL   Triglycerides 127 0 - 149 mg/dL   HDL 43 >39 mg/dL   VLDL Cholesterol Cal 25 5 - 40 mg/dL   LDL Calculated 71 0 - 99 mg/dL   Chol/HDL Ratio 3.2 0.0 - 5.0 ratio units  TSH  Result Value Ref Range   TSH 3.220 0.450 - 4.500 uIU/mL  POCT HgB A1C  Result Value Ref Range   Hemoglobin A1C 8.4       Assessment & Plan:   Problem List Items Addressed This Visit      Cardiovascular and Mediastinum   Atrial fibrillation (HCC)    SR with PAC's today. Compliant with current medication regimen. Followed by cardiology.         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  Treat for exacerbation today- Prednisone 61m daily x 5 days, levofloxacin 5064mx 7 days.  Alarm symptoms reviewed. Return precautions reviewed. Made pt an appt to see pulmonology on Friday, November 11.       Relevant Medications   tiotropium (SPIRIVA) 18 MCG inhalation capsule   albuterol (PROVENTIL) (2.5 MG/3ML) 0.083% nebulizer solution   ipratropium-albuterol (DUONEB) 0.5-2.5 (3) MG/3ML nebulizer solution 3 mL   predniSONE (DELTASONE) 20 MG tablet   levofloxacin (LEVAQUIN) 500 MG tablet   Other Relevant Orders   DG Chest 2 View (Completed)     Endocrine   [place]   Pt instructed to monitor blood sugars carefully while on prednisone for COPD.   Last A1c was 8.4% Oral regimen only.        Other Visit Diagnoses    Short of breath on exertion        Increased 2/2 COPD exacerbation. Treat with prednisone. Pt to follow-up with pulmonology on Friday.     Relevant Orders    DG Chest 2 View (Completed)       Meds  ordered this encounter  Medications  . DISCONTD: predniSONE (DELTASONE) 10 MG tablet    Sig: Prednisone 10 mg, 4 pills q day x 2 days, 3 pills q day x 2 days, 2 pills q day x 2 days, 1 pill q day x 2 days.  . Marland Kitcheniotropium (SPIRIVA) 18 MCG inhalation capsule    Sig: Place into inhaler and inhale.  . albuterol (PROVENTIL) (2.5 MG/3ML) 0.083% nebulizer solution    Sig: USE ONE VIAL IN NEBULIZER EVERY 6 HOURS AS NEEDED FOR WHEEZING  . DISCONTD: albuterol (PROAIR HFA) 108 (90 BASE) MCG/ACT inhaler    Sig: Inhale into the lungs.  . Marland Kitchenpratropium-albuterol (DUONEB) 0.5-2.5 (3) MG/3ML nebulizer solution 3 mL    Sig:   . predniSONE (DELTASONE) 20 MG tablet    Sig: Take 1 tablet (20 mg total) by mouth daily with breakfast.    Dispense:  10 tablet    Refill:  0    Order Specific Question:  Supervising Provider    Answer:  HAArlis [id number]. levofloxacin (LEVAQUIN) 500 MG tablet    Sig: Take 1 tablet (500 mg total) by mouth daily.    Dispense:  7 tablet    Refill:  0    Order Specific Question:  Supervising Provider    Answer:  HAArlis Porta9[[id number]]    Follow up plan: Return if symptoms worsen or fail to improve.

## 2015-03-09 NOTE — Assessment & Plan Note (Signed)
SR with PAC's today. Compliant with current medication regimen. Followed by cardiology.

## 2015-03-12 DIAGNOSIS — R05 Cough: Secondary | ICD-10-CM | POA: Diagnosis not present

## 2015-03-12 DIAGNOSIS — R0609 Other forms of dyspnea: Secondary | ICD-10-CM | POA: Diagnosis not present

## 2015-03-12 DIAGNOSIS — J439 Emphysema, unspecified: Secondary | ICD-10-CM | POA: Diagnosis not present

## 2015-03-12 NOTE — Addendum Note (Signed)
Addended by: Devona Konig on: 03/12/2015 10:08 AM   Modules accepted: Orders

## 2015-03-16 ENCOUNTER — Encounter: Payer: Self-pay | Admitting: Family Medicine

## 2015-03-22 ENCOUNTER — Other Ambulatory Visit: Payer: Self-pay | Admitting: Family Medicine

## 2015-03-29 DIAGNOSIS — J432 Centrilobular emphysema: Secondary | ICD-10-CM | POA: Diagnosis not present

## 2015-04-12 ENCOUNTER — Ambulatory Visit: Payer: Medicare Other | Admitting: Family Medicine

## 2015-05-17 IMAGING — CT CT NECK WITH CONTRAST
1 series · 1 of 1 positions shown · IV contrast (agent unspecified)
Comparison: none

REASON FOR EXAM: Labs 1st   vocal chord paralysis METFORMIN [REDACTED] from
skull base to corona
COMMENTS:

PROCEDURE:     KCT - KCT NECK WITH CONTRAST  - January 29, 2013  [DATE]
RESULT:
TECHNIQUE: Helical 3 mm sections were obtained from the skull base to the
vertex status post intravenous administration of 75 ml of [id number].

[Series 1: topogram 0.6 t20f · sagittal · 1.00mm/px · 1 of 1 slices shown]
[im 1/1]
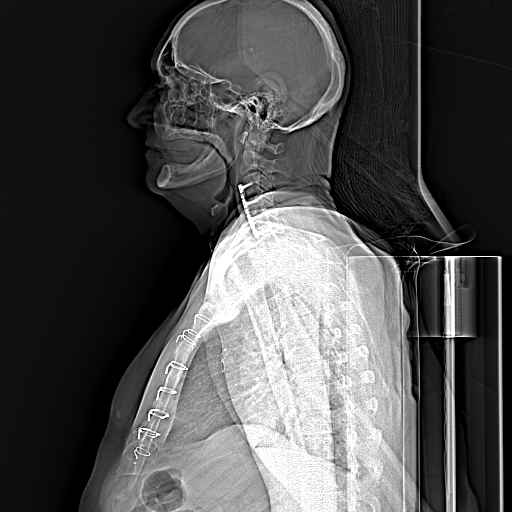

[1 of 1 positions shown; findings below may reference images not displayed]

FINDINGS: The skull base is unremarkable. There are findings which
represent either an accessory salivary gland on the left versus a prominent
submental lymph node on image #56 of the soft tissue windowing. This nodule
measures 9.9 mm in short axis. There is no further evidence of adenopathy,
free fluid or masses within the neck. Mildly prominent lymph nodes are
identified within the carotid spaces on the right measuring 6.9 mm in short
axis and on the left 7.7 mm. The opacified vascular structures are
unremarkable. The spaces of the neck are maintained. The salivary glands are
otherwise symmetric as well as the parotid glands without areas of abnormal
enhancement. The pharyngeal regions are unremarkable. The airway is patent.
Evaluation of the laryngeal region demonstrates asymmetric soft tissue
prominence in the region of the left vocal cord. This finding is consistent
with the patient's reported history of paralysis. No definite mass or nodule
is appreciated. These findings are best seen on image #69 of the soft tissue
series. Evaluation of the upper mediastinum demonstrates no evidence of
mediastinal masses or adenopathy. The lung apices are unremarkable.
IMPRESSION: 1.     Asymmetric prominence in the region of the left vocal cord in the
region of the true and false cords. This finding is consistent with the
patient's history of paralysis and a definite mass is not appreciated.
2.     Findings representing either accessory salivary gland on the left or
prominent submental lymph node. The neck is otherwise is unremarkable.

## 2015-05-19 ENCOUNTER — Other Ambulatory Visit: Payer: Self-pay | Admitting: Family Medicine

## 2015-05-22 ENCOUNTER — Other Ambulatory Visit: Payer: Self-pay | Admitting: Family Medicine

## 2015-06-12 ENCOUNTER — Other Ambulatory Visit: Payer: Self-pay | Admitting: Cardiovascular Disease

## 2015-07-23 ENCOUNTER — Encounter: Payer: Self-pay | Admitting: Emergency Medicine

## 2015-07-23 ENCOUNTER — Inpatient Hospital Stay: Payer: Medicare Other

## 2015-07-23 ENCOUNTER — Emergency Department: Payer: Medicare Other

## 2015-07-23 ENCOUNTER — Inpatient Hospital Stay
Admission: EM | Admit: 2015-07-23 | Discharge: 2015-07-29 | DRG: 190 | Disposition: A | Discharge status: Home / Self Care | Payer: Medicare Other | Attending: Internal Medicine | Admitting: Internal Medicine

## 2015-07-23 DIAGNOSIS — Z7952 Long term (current) use of systemic steroids: Secondary | ICD-10-CM | POA: Diagnosis not present

## 2015-07-23 DIAGNOSIS — I4891 Unspecified atrial fibrillation: Secondary | ICD-10-CM | POA: Diagnosis not present

## 2015-07-23 DIAGNOSIS — J441 Chronic obstructive pulmonary disease with (acute) exacerbation: Principal | ICD-10-CM | POA: Diagnosis present

## 2015-07-23 DIAGNOSIS — Z7984 Long term (current) use of oral hypoglycemic drugs: Secondary | ICD-10-CM

## 2015-07-23 DIAGNOSIS — Z882 Allergy status to sulfonamides status: Secondary | ICD-10-CM

## 2015-07-23 DIAGNOSIS — R001 Bradycardia, unspecified: Secondary | ICD-10-CM | POA: Diagnosis not present

## 2015-07-23 DIAGNOSIS — Z91041 Radiographic dye allergy status: Secondary | ICD-10-CM | POA: Diagnosis not present

## 2015-07-23 DIAGNOSIS — I4719 Other supraventricular tachycardia: Secondary | ICD-10-CM | POA: Diagnosis present

## 2015-07-23 DIAGNOSIS — R0600 Dyspnea, unspecified: Secondary | ICD-10-CM

## 2015-07-23 DIAGNOSIS — I110 Hypertensive heart disease with heart failure: Secondary | ICD-10-CM | POA: Diagnosis not present

## 2015-07-23 DIAGNOSIS — E785 Hyperlipidemia, unspecified: Secondary | ICD-10-CM | POA: Diagnosis present

## 2015-07-23 DIAGNOSIS — D72829 Elevated white blood cell count, unspecified: Secondary | ICD-10-CM | POA: Diagnosis present

## 2015-07-23 DIAGNOSIS — Z888 Allergy status to other drugs, medicaments and biological substances status: Secondary | ICD-10-CM | POA: Diagnosis not present

## 2015-07-23 DIAGNOSIS — T380X5A Adverse effect of glucocorticoids and synthetic analogues, initial encounter: Secondary | ICD-10-CM | POA: Diagnosis not present

## 2015-07-23 DIAGNOSIS — I2510 Atherosclerotic heart disease of native coronary artery without angina pectoris: Secondary | ICD-10-CM | POA: Diagnosis not present

## 2015-07-23 DIAGNOSIS — Z7951 Long term (current) use of inhaled steroids: Secondary | ICD-10-CM | POA: Diagnosis not present

## 2015-07-23 DIAGNOSIS — Z79899 Other long term (current) drug therapy: Secondary | ICD-10-CM | POA: Diagnosis not present

## 2015-07-23 DIAGNOSIS — I5033 Acute on chronic diastolic (congestive) heart failure: Secondary | ICD-10-CM | POA: Diagnosis not present

## 2015-07-23 DIAGNOSIS — J069 Acute upper respiratory infection, unspecified: Secondary | ICD-10-CM | POA: Diagnosis not present

## 2015-07-23 DIAGNOSIS — Z87891 Personal history of nicotine dependence: Secondary | ICD-10-CM | POA: Diagnosis not present

## 2015-07-23 DIAGNOSIS — Z7901 Long term (current) use of anticoagulants: Secondary | ICD-10-CM | POA: Diagnosis not present

## 2015-07-23 DIAGNOSIS — I5030 Unspecified diastolic (congestive) heart failure: Secondary | ICD-10-CM

## 2015-07-23 DIAGNOSIS — I509 Heart failure, unspecified: Secondary | ICD-10-CM | POA: Diagnosis not present

## 2015-07-23 DIAGNOSIS — I471 Supraventricular tachycardia: Secondary | ICD-10-CM | POA: Diagnosis not present

## 2015-07-23 DIAGNOSIS — E039 Hypothyroidism, unspecified: Secondary | ICD-10-CM | POA: Diagnosis not present

## 2015-07-23 DIAGNOSIS — R000 Tachycardia, unspecified: Secondary | ICD-10-CM | POA: Diagnosis not present

## 2015-07-23 DIAGNOSIS — J438 Other emphysema: Secondary | ICD-10-CM | POA: Diagnosis not present

## 2015-07-23 DIAGNOSIS — E7800 Pure hypercholesterolemia, unspecified: Secondary | ICD-10-CM | POA: Diagnosis present

## 2015-07-23 DIAGNOSIS — M1711 Unilateral primary osteoarthritis, right knee: Secondary | ICD-10-CM | POA: Diagnosis present

## 2015-07-23 DIAGNOSIS — E119 Type 2 diabetes mellitus without complications: Secondary | ICD-10-CM | POA: Diagnosis present

## 2015-07-23 DIAGNOSIS — N179 Acute kidney failure, unspecified: Secondary | ICD-10-CM | POA: Diagnosis not present

## 2015-07-23 DIAGNOSIS — I959 Hypotension, unspecified: Secondary | ICD-10-CM | POA: Diagnosis not present

## 2015-07-23 DIAGNOSIS — J9601 Acute respiratory failure with hypoxia: Secondary | ICD-10-CM | POA: Diagnosis not present

## 2015-07-23 DIAGNOSIS — I480 Paroxysmal atrial fibrillation: Secondary | ICD-10-CM | POA: Diagnosis not present

## 2015-07-23 DIAGNOSIS — Z880 Allergy status to penicillin: Secondary | ICD-10-CM

## 2015-07-23 DIAGNOSIS — J45909 Unspecified asthma, uncomplicated: Secondary | ICD-10-CM | POA: Diagnosis not present

## 2015-07-23 DIAGNOSIS — Z794 Long term (current) use of insulin: Secondary | ICD-10-CM | POA: Diagnosis not present

## 2015-07-23 DIAGNOSIS — J432 Centrilobular emphysema: Secondary | ICD-10-CM | POA: Diagnosis not present

## 2015-07-23 DIAGNOSIS — R0602 Shortness of breath: Secondary | ICD-10-CM | POA: Diagnosis not present

## 2015-07-23 DIAGNOSIS — Z951 Presence of aortocoronary bypass graft: Secondary | ICD-10-CM

## 2015-07-23 DIAGNOSIS — J449 Chronic obstructive pulmonary disease, unspecified: Secondary | ICD-10-CM | POA: Diagnosis not present

## 2015-07-23 HISTORY — DX: Chronic obstructive pulmonary disease, unspecified: J44.9

## 2015-07-23 LAB — CBC
HCT: 43.7 % (ref 40.0–52.0)
HEMOGLOBIN: 15.1 g/dL (ref 13.0–18.0)
MCH: 31.3 pg (ref 26.0–34.0)
MCHC: 34.5 g/dL (ref 32.0–36.0)
MCV: 90.6 fL (ref 80.0–100.0)
Platelets: 160 10*3/uL (ref 150–440)
RBC: 4.83 MIL/uL (ref 4.40–5.90)
RDW: 13.8 % (ref 11.5–14.5)
WBC: 7.6 10*3/uL (ref 3.8–10.6)

## 2015-07-23 LAB — BASIC METABOLIC PANEL
ANION GAP: 6 (ref 5–15)
BUN: 20 mg/dL (ref 6–20)
CHLORIDE: 107 mmol/L (ref 101–111)
CO2: 24 mmol/L (ref 22–32)
Calcium: 8.6 mg/dL — ABNORMAL LOW (ref 8.9–10.3)
Creatinine, Ser: 0.97 mg/dL (ref 0.61–1.24)
GFR calc non Af Amer: >60 mL/min (ref >60)
Glucose, Bld: 166 mg/dL — ABNORMAL HIGH (ref 65–99)
Potassium: 3.8 mmol/L (ref 3.5–5.1)
Sodium: 137 mmol/L (ref 135–145)

## 2015-07-23 LAB — TSH: TSH: 4.21 u[IU]/mL (ref 0.350–4.500)

## 2015-07-23 LAB — GLUCOSE, CAPILLARY: GLUCOSE-CAPILLARY: 378 mg/dL — AB (ref 65–99)

## 2015-07-23 LAB — TROPONIN I

## 2015-07-23 MED ORDER — ATORVASTATIN CALCIUM 20 MG PO TABS
20.0000 mg | ORAL_TABLET | Freq: Every day | ORAL | Status: DC
  Administered 2015-07-23 – 2015-07-28 (×6): 20 mg via ORAL
  Filled 2015-07-23 (×6): qty 1

## 2015-07-23 MED ORDER — SODIUM CHLORIDE 0.9 % IV BOLUS (SEPSIS)
1000.0000 mL | Freq: Once | INTRAVENOUS | Status: AC
  Administered 2015-07-23: 1000 mL via INTRAVENOUS

## 2015-07-23 MED ORDER — METOPROLOL TARTRATE 25 MG PO TABS: 25.0000 mg | ORAL_TABLET | Freq: Four times a day (QID) | ORAL | Status: DC

## 2015-07-23 MED ORDER — METOPROLOL TARTRATE 1 MG/ML IV SOLN
INTRAVENOUS | Status: AC
  Administered 2015-07-23: 5 mg via INTRAVENOUS
  Filled 2015-07-23: qty 5

## 2015-07-23 MED ORDER — INSULIN ASPART 100 UNIT/ML ~~LOC~~ SOLN
0.0000 [IU] | Freq: Three times a day (TID) | SUBCUTANEOUS | Status: DC
  Administered 2015-07-24: 11 [IU] via SUBCUTANEOUS
  Administered 2015-07-24: 20 [IU] via SUBCUTANEOUS
  Administered 2015-07-24: 7 [IU] via SUBCUTANEOUS
  Administered 2015-07-25: 11 [IU] via SUBCUTANEOUS
  Administered 2015-07-25: 15 [IU] via SUBCUTANEOUS
  Administered 2015-07-25 – 2015-07-26 (×2): 11 [IU] via SUBCUTANEOUS
  Administered 2015-07-26: 4 [IU] via SUBCUTANEOUS
  Administered 2015-07-26: 7 [IU] via SUBCUTANEOUS
  Administered 2015-07-27: 4 [IU] via SUBCUTANEOUS
  Administered 2015-07-27: 11 [IU] via SUBCUTANEOUS
  Administered 2015-07-28: 3 [IU] via SUBCUTANEOUS
  Administered 2015-07-28: 4 [IU] via SUBCUTANEOUS
  Administered 2015-07-28: 7 [IU] via SUBCUTANEOUS
  Administered 2015-07-29: 4 [IU] via SUBCUTANEOUS
  Administered 2015-07-29: 3 [IU] via SUBCUTANEOUS
  Filled 2015-07-23: qty 4
  Filled 2015-07-23: qty 11
  Filled 2015-07-23: qty 15
  Filled 2015-07-23: qty 20
  Filled 2015-07-23: qty 7
  Filled 2015-07-23 (×2): qty 11
  Filled 2015-07-23: qty 4
  Filled 2015-07-23: qty 11
  Filled 2015-07-23: qty 4
  Filled 2015-07-23: qty 7
  Filled 2015-07-23: qty 3
  Filled 2015-07-23: qty 11
  Filled 2015-07-23: qty 4

## 2015-07-23 MED ORDER — IPRATROPIUM-ALBUTEROL 0.5-2.5 (3) MG/3ML IN SOLN
3.0000 mL | Freq: Once | RESPIRATORY_TRACT | Status: AC
  Administered 2015-07-23: 3 mL via RESPIRATORY_TRACT
  Filled 2015-07-23: qty 3

## 2015-07-23 MED ORDER — LEVOFLOXACIN IN D5W 500 MG/100ML IV SOLN
500.0000 mg | INTRAVENOUS | Status: DC
  Administered 2015-07-24 – 2015-07-27 (×4): 500 mg via INTRAVENOUS
  Filled 2015-07-23 (×4): qty 100

## 2015-07-23 MED ORDER — TIOTROPIUM BROMIDE MONOHYDRATE 18 MCG IN CAPS
18.0000 ug | ORAL_CAPSULE | Freq: Every day | RESPIRATORY_TRACT | Status: DC
  Administered 2015-07-23 – 2015-07-29 (×7): 18 ug via RESPIRATORY_TRACT
  Filled 2015-07-23 (×2): qty 5

## 2015-07-23 MED ORDER — LEVALBUTEROL HCL 1.25 MG/0.5ML IN NEBU
1.2500 mg | INHALATION_SOLUTION | Freq: Four times a day (QID) | RESPIRATORY_TRACT | Status: DC
  Administered 2015-07-24 (×4): 1.25 mg via RESPIRATORY_TRACT
  Filled 2015-07-23 (×4): qty 0.5

## 2015-07-23 MED ORDER — DILTIAZEM HCL 25 MG/5ML IV SOLN
10.0000 mg | Freq: Once | INTRAVENOUS | Status: AC
  Administered 2015-07-23: 10 mg via INTRAVENOUS
  Filled 2015-07-23: qty 5

## 2015-07-23 MED ORDER — ONDANSETRON HCL 4 MG/2ML IJ SOLN: 4.0000 mg | Freq: Four times a day (QID) | INTRAMUSCULAR | Status: DC | PRN

## 2015-07-23 MED ORDER — MONTELUKAST SODIUM 10 MG PO TABS
10.0000 mg | ORAL_TABLET | Freq: Every day | ORAL | Status: DC
  Administered 2015-07-23 – 2015-07-28 (×6): 10 mg via ORAL
  Filled 2015-07-23 (×6): qty 1

## 2015-07-23 MED ORDER — ACETAMINOPHEN 650 MG RE SUPP: 650.0000 mg | Freq: Four times a day (QID) | RECTAL | Status: DC | PRN

## 2015-07-23 MED ORDER — INSULIN ASPART 100 UNIT/ML ~~LOC~~ SOLN
0.0000 [IU] | Freq: Every day | SUBCUTANEOUS | Status: DC
  Administered 2015-07-23: 5 [IU] via SUBCUTANEOUS
  Administered 2015-07-24: 3 [IU] via SUBCUTANEOUS
  Administered 2015-07-25: 2 [IU] via SUBCUTANEOUS
  Administered 2015-07-27: 3 [IU] via SUBCUTANEOUS
  Administered 2015-07-28: 5 [IU] via SUBCUTANEOUS
  Filled 2015-07-23: qty 5
  Filled 2015-07-23: qty 2
  Filled 2015-07-23 (×2): qty 3
  Filled 2015-07-23: qty 2

## 2015-07-23 MED ORDER — ACETAMINOPHEN 325 MG PO TABS
650.0000 mg | ORAL_TABLET | Freq: Four times a day (QID) | ORAL | Status: DC | PRN
Start: 2015-07-23 — End: 2015-07-29

## 2015-07-23 MED ORDER — TECHNETIUM TO 99M ALBUMIN AGGREGATED
4.0240 | Freq: Once | INTRAVENOUS | Status: AC | PRN
  Administered 2015-07-23: 4.024 via INTRAVENOUS

## 2015-07-23 MED ORDER — LOSARTAN POTASSIUM 50 MG PO TABS
100.0000 mg | ORAL_TABLET | Freq: Every day | ORAL | Status: DC
  Administered 2015-07-23 – 2015-07-24 (×2): 100 mg via ORAL
  Filled 2015-07-23 (×3): qty 2

## 2015-07-23 MED ORDER — METFORMIN HCL 500 MG PO TABS
1000.0000 mg | ORAL_TABLET | Freq: Two times a day (BID) | ORAL | Status: DC
  Administered 2015-07-24 – 2015-07-29 (×11): 1000 mg via ORAL
  Filled 2015-07-23 (×11): qty 2

## 2015-07-23 MED ORDER — AZITHROMYCIN 500 MG PO TABS
500.0000 mg | ORAL_TABLET | Freq: Once | ORAL | Status: AC
  Administered 2015-07-23: 500 mg via ORAL
  Filled 2015-07-23: qty 1

## 2015-07-23 MED ORDER — LEVALBUTEROL HCL 1.25 MG/0.5ML IN NEBU: 1.2500 mg | INHALATION_SOLUTION | Freq: Four times a day (QID) | RESPIRATORY_TRACT | Status: DC

## 2015-07-23 MED ORDER — ONDANSETRON HCL 4 MG PO TABS
4.0000 mg | ORAL_TABLET | Freq: Four times a day (QID) | ORAL | Status: DC | PRN
  Administered 2015-07-27 – 2015-07-28 (×2): 4 mg via ORAL
  Filled 2015-07-23 (×2): qty 1

## 2015-07-23 MED ORDER — METOPROLOL TARTRATE 50 MG PO TABS
50.0000 mg | ORAL_TABLET | Freq: Two times a day (BID) | ORAL | Status: DC
  Administered 2015-07-23 – 2015-07-24 (×3): 50 mg via ORAL
  Filled 2015-07-23 (×4): qty 1

## 2015-07-23 MED ORDER — DILTIAZEM HCL 100 MG IV SOLR
5.0000 mg/h | INTRAVENOUS | Status: DC
  Administered 2015-07-23 – 2015-07-24 (×2): 5 mg/h via INTRAVENOUS
  Filled 2015-07-23 (×2): qty 100

## 2015-07-23 MED ORDER — SODIUM CHLORIDE 0.9% FLUSH
3.0000 mL | Freq: Two times a day (BID) | INTRAVENOUS | Status: DC
  Administered 2015-07-23 – 2015-07-29 (×12): 3 mL via INTRAVENOUS

## 2015-07-23 MED ORDER — ENOXAPARIN SODIUM 40 MG/0.4ML ~~LOC~~ SOLN
40.0000 mg | SUBCUTANEOUS | Status: DC
  Administered 2015-07-23 – 2015-07-28 (×5): 40 mg via SUBCUTANEOUS
  Filled 2015-07-23 (×5): qty 0.4

## 2015-07-23 MED ORDER — LEVOTHYROXINE SODIUM 75 MCG PO TABS
75.0000 ug | ORAL_TABLET | Freq: Every day | ORAL | Status: DC
  Administered 2015-07-24 – 2015-07-29 (×6): 75 ug via ORAL
  Filled 2015-07-23 (×6): qty 1

## 2015-07-23 MED ORDER — METHYLPREDNISOLONE SODIUM SUCC 125 MG IJ SOLR
60.0000 mg | Freq: Three times a day (TID) | INTRAMUSCULAR | Status: DC
  Administered 2015-07-23 – 2015-07-25 (×5): 60 mg via INTRAVENOUS
  Filled 2015-07-23 (×5): qty 2

## 2015-07-23 MED ORDER — SENNOSIDES-DOCUSATE SODIUM 8.6-50 MG PO TABS: 1.0000 | ORAL_TABLET | Freq: Every evening | ORAL | Status: DC | PRN

## 2015-07-23 MED ORDER — TECHNETIUM TC 99M DIETHYLENETRIAME-PENTAACETIC ACID
31.4740 | Freq: Once | INTRAVENOUS | Status: AC | PRN
  Administered 2015-07-23: 31.474 via INTRAVENOUS

## 2015-07-23 MED ORDER — METOPROLOL TARTRATE 1 MG/ML IV SOLN
5.0000 mg | Freq: Once | INTRAVENOUS | Status: AC
  Administered 2015-07-23: 5 mg via INTRAVENOUS

## 2015-07-23 MED ORDER — HYDROCODONE-ACETAMINOPHEN 5-325 MG PO TABS: 1.0000 | ORAL_TABLET | ORAL | Status: DC | PRN

## 2015-07-23 MED ORDER — METHYLPREDNISOLONE SODIUM SUCC 125 MG IJ SOLR
125.0000 mg | Freq: Once | INTRAMUSCULAR | Status: AC
  Administered 2015-07-23: 125 mg via INTRAVENOUS
  Filled 2015-07-23: qty 2

## 2015-07-23 NOTE — ED Notes (Signed)
Wife to nurse's station stating patient wants to use the inhaler in his pocket due to feeling increased shortness of breath.  Dr. Kerman Passey notified and stated he had just ordered some Neb treatments and solumedrol. Given medications at this time.

## 2015-07-23 NOTE — ED Notes (Signed)
Pt in with co shob since tonight, no chest pain has hx of copd.

## 2015-07-23 NOTE — ED Notes (Signed)
Pt back from nuclear med. VS stable , pt A&O

## 2015-07-23 NOTE — ED Notes (Signed)
2A has received report from this RN and has questions and is calling DR before taking pt

## 2015-07-23 NOTE — ED Notes (Signed)
Patient transported to MRI 

## 2015-07-23 NOTE — H&P (Signed)
[facility] NAME: Philip [name]    MR#:  [id number]  DATE OF BIRTH:  [birth year]  DATE OF ADMISSION:  07/23/2015  PRIMARY CARE PHYSICIAN: [name], MD   REQUESTING/REFERRING PHYSICIAN: Dr Hedwig Morton  CHIEF COMPLAINT:  SOB  HISTORY OF PRESENT ILLNESS:  Philip [name]  is a 70 y.o. male with a known history of COPD, diabetes, PAF and CAD who presents with above complaint. Over the past few days patient had increasing shortness of breath, wheezing and cough. He is found to have COPD exacerbation. He has received IV steroids and nebulizer treatments. Despite this he remains hypoxic and wheezing. He also has sinus tachycardia tachycardia with heart rates in the 140s  PAST MEDICAL HISTORY:   Past Medical History  Diagnosis Date  . Diabetes mellitus without complication ([place])   . Thyroid disease   . Collapse of right lung     a. 12/1967 - ? etiology.  [name] History of bronchitis   . Type II diabetes mellitus ([place])   . Hypothyroidism   . Arthritis     a. right knee  . Hypercholesteremia   . Shingles   . Hypertension   . Coronary artery disease     a. 05/2012 Cath: severe 3VD-->CABG by Dr. Servando Snare in 05/2012 with LIMA to LAD, SVG to LCX & SVG to RPDA.  [name] Asthma   . COPD (chronic obstructive pulmonary disease) ([place])     PAST SURGICAL HISTORY:   Past Surgical History  Procedure Laterality Date  . Knee surgery  2003    right  . Neck surgery  2008    Plate in neck  . Knee arthroscopy  ~ 2000    "right" (05/06/2012)  . Anterior cervical decomp/discectomy fusion  ~ 2009  . Intraoperative transesophageal echocardiogram  05/07/2012    Procedure: INTRAOPERATIVE TRANSESOPHAGEAL ECHOCARDIOGRAM;  Surgeon: [name], MD;  Location: [facility];  Service: Open Heart Surgery;  Laterality: N/A;  . Coronary artery bypass graft  05/07/2012    Procedure: CORONARY ARTERY BYPASS GRAFTING (CABG);  Surgeon: [name], MD;  Location:  [facility];  Service: Open Heart Surgery;  Laterality: N/A;  times three  . Cardiac catheterization  05/06/2012    Significant three-vessel coronary artery disease with normal ejection fraction    SOCIAL HISTORY:   Social History  Substance Use Topics  . Smoking status: Former Smoker -- 1.00 packs/day for 8 years    Types: Cigarettes    Quit date: 12/31/1967  . Smokeless tobacco: Never Used  . Alcohol Use: No     Comment: 05/06/2012 "last alcohol several years ago; never had problem wit"    FAMILY HISTORY:   Family History  Problem Relation Age of Onset  . Heart disease Mother   . Clotting disorder Mother   . Hypertension Sister     DRUG ALLERGIES:   Allergies  Allergen Reactions  . Iodine Rash  . Penicillins Hives, Itching, Rash and Other (See Comments)    "broke out in welps" (05/06/2012)  . Sulfa Antibiotics Shortness Of Breath  . Iodine     Rash and itching  . Ivp Dye [Iodinated Diagnostic Agents]     Rash;redness  . Penicillins     Hives  . Sulfa Antibiotics     Difficulty Breathing     REVIEW OF SYSTEMS:  CONSTITUTIONAL: No fever, fatigue or weakness.  EYES: No blurred or double vision.  EARS, NOSE, AND THROAT: No  tinnitus or ear pain.  RESPIRATORY: Positive cough, shortness of breath, wheezing no hemoptysis.  CARDIOVASCULAR: No chest pain, orthopnea, edema.  GASTROINTESTINAL: No nausea, vomiting, diarrhea or abdominal pain.  GENITOURINARY: No dysuria, hematuria.  ENDOCRINE: No polyuria, nocturia,  HEMATOLOGY: No anemia, easy bruising or bleeding SKIN: No rash or lesion. MUSCULOSKELETAL: No joint pain or arthritis.   NEUROLOGIC: No tingling, numbness, weakness.  PSYCHIATRY: No anxiety or depression.   MEDICATIONS AT HOME:   Prior to Admission medications   Medication Sig Start Date End Date Taking? Authorizing Provider  albuterol (PROVENTIL) (2.5 MG/3ML) 0.083% nebulizer solution Take 2.5 mg by nebulization every 6 (six) hours as needed for wheezing or  shortness of breath.   Yes Historical Provider, MD  atorvastatin (LIPITOR) 40 MG tablet TAKE ONE-HALF TABLET BY MOUTH AT BEDTIME 02/24/15  Yes Arlis Porta., MD  ELIQUIS 5 MG TABS tablet TAKE ONE TABLET BY MOUTH TWICE DAILY 06/14/15  Yes Minna Merritts, MD  fluticasone (FLONASE) 50 MCG/ACT nasal spray USE TWO SPRAY(S) IN EACH NOSTRIL ONCE DAILY 05/20/15  Yes Amy Overton Mam, NP  glucose monitoring kit (FREESTYLE) monitoring kit 1 each by Does not apply route as needed for other. 05/17/12  Yes [name], MD  LANCETS ULTRA THIN MISC 1 Device by Does not apply route 4 (four) times daily -  before meals and at bedtime. 05/17/12  Yes [name], MD  levothyroxine (SYNTHROID, LEVOTHROID) 75 MCG tablet TAKE ONE TABLET BY MOUTH ONCE DAILY 02/08/15  Yes Arlis Porta., MD  losartan (COZAAR) 100 MG tablet TAKE ONE TABLET BY MOUTH ONCE DAILY 01/25/15  Yes Arlis Porta., MD  metFORMIN (GLUCOPHAGE) 500 MG tablet Take 2 tablets by mouth 2 (two) times daily. 06/23/15  Yes Historical Provider, MD  metoprolol (LOPRESSOR) 50 MG tablet TAKE ONE TABLET BY MOUTH TWICE DAILY 02/05/15  Yes [name], MD  montelukast (SINGULAIR) 10 MG tablet Take 10 mg by mouth at bedtime.  02/08/15  Yes Historical Provider, MD  ONE TOUCH ULTRA TEST test strip USE ONE STRIP TO CHECK GLUCOSE 1-2 TIMES DAILY 03/22/15  Yes Arlis Porta., MD  predniSONE (DELTASONE) 10 MG tablet Take 1 tablet by mouth daily. 07/13/15  Yes Historical Provider, MD  PROAIR HFA 108 (90 BASE) MCG/ACT inhaler INHALE TWO PUFFS BY MOUTH 4 TIMES DAILY AS NEEDED 03/08/15  Yes Arlis Porta., MD  tiotropium (SPIRIVA) 18 MCG inhalation capsule Place 18 mcg into inhaler and inhale daily.  02/15/15 02/15/16 Yes Historical Provider, MD  fluticasone (FLOVENT HFA) 110 MCG/ACT inhaler Inhale 1-2 puffs into the lungs 2 (two) times daily. Patient not taking: Reported on 07/23/2015 02/10/15   Arlis Porta., MD  predniSONE (DELTASONE)  20 MG tablet Take 2 tablets (40 mg total) by mouth daily with breakfast. Patient not taking: Reported on 07/23/2015 03/09/15   Amy Overton Mam, NP      VITAL SIGNS:  Blood pressure 132/75, pulse 84, temperature 97.7 F (36.5 C), temperature source Oral, resp. rate 20, height 5' 11"  (1.803 m), weight 102.059 kg (225 lb), SpO2 96 %.  PHYSICAL EXAMINATION:  GENERAL:  70 y.o.-year-old patient lying in the bed with no acute distress.  EYES: Pupils equal, round, reactive to light and accommodation. No scleral icterus. Extraocular muscles intact.  HEENT: Head atraumatic, normocephalic. Oropharynx and nasopharynx clear.  NECK:  Supple, no jugular venous distention. No thyroid enlargement, no tenderness.  LUNGS: Bilateral prolonged expiratory wheezing without rales,rhonchi  or crepitation. No use of accessory muscles of respiration.  CARDIOVASCULAR: S1, S2 normal. No murmurs, rubs, or gallops.  ABDOMEN: Soft, nontender, nondistended. Bowel sounds present. No organomegaly or mass.  EXTREMITIES: No pedal edema, cyanosis, or clubbing.  NEUROLOGIC: Cranial nerves II through XII are grossly intact. No focal deficits. PSYCHIATRIC: The patient is alert and oriented x 3.  SKIN: No obvious rash, lesion, or ulcer.   LABORATORY PANEL:   CBC  Recent Labs Lab 07/23/15 0717  WBC 7.6  HGB 15.1  HCT 43.7  PLT 160   ------------------------------------------------------------------------------------------------------------------  Chemistries   Recent Labs Lab 07/23/15 0717  NA 137  K 3.8  CL 107  CO2 24  GLUCOSE 166*  BUN 20  CREATININE 0.97  CALCIUM 8.6*   ------------------------------------------------------------------------------------------------------------------  Cardiac Enzymes  Recent Labs Lab 07/23/15 0717  TROPONINI <0.03   ------------------------------------------------------------------------------------------------------------------  RADIOLOGY:  Dg Chest Portable 1  View  07/23/2015  CLINICAL DATA:  Shortness of Breath EXAM: PORTABLE CHEST 1 VIEW COMPARISON:  03/09/2015 FINDINGS: The heart size and mediastinal contours are within normal limits. Both lungs are clear. The visualized skeletal structures are unremarkable. IMPRESSION: No active disease. Electronically Signed   By: Inez Catalina M.D.   On: 07/23/2015 07:26    EKG:   Sinus tachycardia heart rate 140 with PACs  IMPRESSION AND PLAN:   70 year old male with a history of COPD, CAD and hypertension presents with shortness of breath and found to have acute COPD exacerbation.  1. Acute hypoxic respiratory failure due to COPD exacerbation: Continue IV steroids, nebulizer treatments (using Xopenex due to tachycardia), Levaquin and inhaler. In oxygen as tolerated. Patient is on 10 mg of prednisone daily. 2. Diabetes: Sliding scale insulin, ADA diet Continue metformin.  3. CAD: Continue atorvastatin, metoprolol.  4. Sinus tachycardia with  HX PAF: Family is concerned due to sinus tachycardia. This is likely due to nebulizer treatments given in the ER. Changed to Xopenex and obtain consultation with cardiology as per request of family. Check TSH as well.  4. Hypothyroid: Continue Synthroid and check TSH.  5. PAF: IV diltiazem 1. Continue metoprolol and Eliquis. Cardiology consult  All the records are reviewed and case discussed with ED provider. Management plans discussed with the patient and she is in agreement.  CODE STATUS: DNI  TOTAL TIME TAKING CARE OF THIS PATIENT: 50 minutes.    Jeane Cashatt M.D on 07/23/2015 at 9:19 AM  Between 7am to 6pm - Pager - [phone] After 6pm go to [email or web address] - password [email or web address] [facility]  Office  [phone]  CC: Primary care physician; [name], MD

## 2015-07-23 NOTE — ED Provider Notes (Signed)
[facility] Emergency Department Provider Note  Time seen: 7:25 AM  I have reviewed the triage vital signs and the nursing notes.   HISTORY  Chief Complaint Shortness of Breath    HPI Philip [name]. is a 70 y.o. male with a past medical history of diabetes, arthritis, hyperlipidemia, hypertension, atrial fibrillation on Eliquis, COPD, who presents to the emergency department with difficulty breathing.  According to the patient for the past one week he has been coughing with increased difficulty breathing. This morning he states it was much worse he could not sleep overnight due to difficulty breathing. Patient has significant wheezes, and trouble breathing upon arrival to the emergency department. States he has been taking his home COPD/asthma medications without relief. Patient is currently on prednisone for a prolonged taper since December by his pulmonologist Dr. Raul Del. Denies any chest pain does state moderate chest tightness. Denies nausea, diaphoresis. Denies leg pain or swelling. Patient describes her shortness of breath as significant. States he had a fever to 101 last night.     Past Medical History  Diagnosis Date  . Diabetes mellitus without complication ([place])   . Thyroid disease   . Collapse of right lung     a. 12/1967 - ? etiology.  [name] History of bronchitis   . Type II diabetes mellitus ([place])   . Hypothyroidism   . Arthritis     a. right knee  . Hypercholesteremia   . Shingles   . Hypertension   . Coronary artery disease     a. 05/2012 Cath: severe 3VD-->CABG by Dr. Servando Snare in 05/2012 with LIMA to LAD, SVG to LCX & SVG to RPDA.  [name] Asthma     Patient Active Problem List   Diagnosis Date Noted  . Cough 01/11/2015  . Atrial fibrillation ([place]) 12/10/2014  . 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 COPD with asthma ([place]) 11/09/2014  . Bradycardia 11/09/2014  . 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 Diabetes ([place]) 11/09/2014  .  Essential (primary) hypertension 11/09/2014  . Bursitis of hip 11/09/2014  . Hyperthyroidism 11/09/2014  . Enlarged prostate 11/09/2014  . Paroxysmal supraventricular tachycardia ([place]) 09/27/2013  . Premature atrial beats 06/10/2013  . Coronary artery disease   . 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 Coronary atherosclerosis of native coronary artery 05/06/2012  . Hypothyroid 05/06/2012  . Diabetes mellitus ([place]) 05/06/2012  . Atypical angina ([place]) 05/02/2012  . Hypercholesteremia 03/31/2012  . Colon polyp 12/21/2003    Past Surgical History  Procedure Laterality Date  . Knee surgery  2003    right  . Neck surgery  2008    Plate in neck  . Knee arthroscopy  ~ 2000    "right" (05/06/2012)  . Anterior cervical decomp/discectomy fusion  ~ 2009  . Intraoperative transesophageal echocardiogram  05/07/2012    Procedure: INTRAOPERATIVE TRANSESOPHAGEAL ECHOCARDIOGRAM;  Surgeon: [name], MD;  Location: [place];  Service: Open Heart Surgery;  Laterality: N/A;  . Coronary artery bypass graft  05/07/2012    Procedure: CORONARY ARTERY BYPASS GRAFTING (CABG);  Surgeon: [name], MD;  Location: [place];  Service: Open Heart Surgery;  Laterality: N/A;  times three  . Cardiac catheterization  05/06/2012    Significant three-vessel coronary artery disease with normal ejection fraction    Current Outpatient Rx  Name  Route  Sig  Dispense  Refill  . albuterol (PROVENTIL) (2.5 MG/3ML) 0.083% nebulizer solution      USE ONE VIAL IN NEBULIZER  EVERY 6 HOURS AS NEEDED FOR WHEEZING         . atorvastatin (LIPITOR) 40 MG tablet      TAKE ONE-HALF TABLET BY MOUTH AT BEDTIME   30 tablet   6   . Cetirizine HCl (ZYRTEC ALLERGY) 10 MG CAPS   Oral   Take by mouth.         Arne Cleveland 5 MG TABS tablet      TAKE ONE TABLET BY MOUTH TWICE DAILY   60 tablet   6   . fluticasone (FLONASE) 50 MCG/ACT nasal spray      USE TWO SPRAY(S) IN EACH NOSTRIL ONCE DAILY   16 g   11   . fluticasone (FLOVENT  HFA) 110 MCG/ACT inhaler   Inhalation   Inhale 1-2 puffs into the lungs 2 (two) times daily.   1 Inhaler   12   . glucose monitoring kit (FREESTYLE) monitoring kit   Does not apply   1 each by Does not apply route as needed for other.   1 each   0   . LANCETS ULTRA THIN MISC   Does not apply   1 Device by Does not apply route 4 (four) times daily -  before meals and at bedtime.   200 each   5   . levofloxacin (LEVAQUIN) 500 MG tablet   Oral   Take 1 tablet (500 mg total) by mouth daily.   7 tablet   0   . levothyroxine (SYNTHROID, LEVOTHROID) 75 MCG tablet      TAKE ONE TABLET BY MOUTH ONCE DAILY   30 tablet   6   . losartan (COZAAR) 100 MG tablet      TAKE ONE TABLET BY MOUTH ONCE DAILY   30 tablet   12   . metFORMIN (GLUCOPHAGE-XR) 500 MG 24 hr tablet   Oral   Take 500 mg by mouth 2 (two) times daily. Take 2 tablets twice a day.         . metoprolol (LOPRESSOR) 50 MG tablet      TAKE ONE TABLET BY MOUTH TWICE DAILY   60 tablet   6   . montelukast (SINGULAIR) 10 MG tablet               . ONE TOUCH ULTRA TEST test strip      USE ONE STRIP TO CHECK GLUCOSE 1-2 TIMES DAILY   100 each   12   . predniSONE (DELTASONE) 20 MG tablet   Oral   Take 2 tablets (40 mg total) by mouth daily with breakfast.   10 tablet   0   . PROAIR HFA 108 (90 BASE) MCG/ACT inhaler      INHALE TWO PUFFS BY MOUTH 4 TIMES DAILY AS NEEDED   9 each   3   . tiotropium (SPIRIVA) 18 MCG inhalation capsule   Inhalation   Place into inhaler and inhale.           Allergies Iodine; Penicillins; Sulfa antibiotics; Iodine; Ivp dye; Penicillins; and Sulfa antibiotics  Family History  Problem Relation Age of Onset  . Heart disease Mother   . Clotting disorder Mother   . Hypertension Sister     Social History Social History  Substance Use Topics  . Smoking status: Former Smoker -- 1.00 packs/day for 8 years    Types: Cigarettes    Quit date: 12/31/1967  . Smokeless  tobacco: Never Used  . Alcohol Use: No  Comment: 05/06/2012 "last alcohol several years ago; never had problem wit"    Review of Systems Constitutional: Positive fever last night per patient Cardiovascular: Negative for chest pain. Respiratory: Positive for shortness of breath and cough. Gastrointestinal: Negative for abdominal pain Musculoskeletal: Negative for back pain. Neurological: Negative for headache 10-point ROS otherwise negative.  ____________________________________________   PHYSICAL EXAM:  VITAL SIGNS: ED Triage Vitals  Enc Vitals Group     BP 07/23/15 0656 138/93 mmHg     Pulse Rate 07/23/15 0656 123     Resp 07/23/15 0656 18     Temp 07/23/15 0656 97.7 F (36.5 C)     Temp Source 07/23/15 0656 Oral     SpO2 07/23/15 0656 90 %     Weight 07/23/15 0656 225 lb (102.059 kg)     Height 07/23/15 0656 _0  (1.803 m)     Head Cir --      Peak Flow --      Pain Score --      Pain Loc --      Pain Edu? --      Excl. in Ages? --     Constitutional: Alert and oriented. Well appearing and in no distress. Eyes: Normal exam ENT   Head: Normocephalic and atraumatic.   Mouth/Throat: Mucous membranes are moist. Cardiovascular: Irregular rhythm, rate around 90-100 bpm. Respiratory: Moderate tachypnea, diffuse significant expiratory wheeze. No rales or rhonchi. Mild respiratory distress, Sitting upright in bed. Gastrointestinal: Soft and nontender. No distention.   Musculoskeletal: Nontender with normal range of motion in all extremities. No lower extremity tenderness or edema. Neurologic:  Normal speech and language. No gross focal neurologic deficits  Skin:  Skin is warm, dry and intact.  Psychiatric: Mood and affect are normal.   ____________________________________________    EKG  EKG reviewed and interpreted by myself appears showed atrial fibrillation at 146 bpm. Mild QRS, normal axis, nonspecific ST  changes.  ____________________________________________    RADIOLOGY  Chest x-ray shows no acute findings  ____________________________________________    INITIAL IMPRESSION / ASSESSMENT AND PLAN / ED COURSE  Pertinent labs & imaging results that were available during my care of the patient were reviewed by me and considered in my medical decision making (see chart for details).  Patient presents the emergency department difficulty breathing. Exam most consistent with COPD exacerbation. We will start the patient on to an ounce, IV Solu-Medrol, and closely monitor while awaiting labs and chest x-ray results. Patient did state fever to 101 last night. Patient afebrile in the emergency department.  X-ray shows no acute findings. Labs are largely within normal limits. Patient continues with significant wheeze in all lung fields after breathing treatments. Continues with moderate dyspnea. Continues a tachycardia will occasionally desat to 90% on room air. We'll admit to the hospital for COPD exacerbation. Given a possible fever last night we will also cover with antibiotics.  ____________________________________________   FINAL CLINICAL IMPRESSION(S) / ED DIAGNOSES  COPD exacerbation Upper respiratory infection   Harvest Dark, MD 07/23/15 [phone]

## 2015-07-24 LAB — GLUCOSE, CAPILLARY
GLUCOSE-CAPILLARY: 362 mg/dL — AB (ref 65–99)
GLUCOSE-CAPILLARY: 213 mg/dL — AB (ref 65–99)
GLUCOSE-CAPILLARY: 272 mg/dL — AB (ref 65–99)
Glucose-Capillary: 288 mg/dL — ABNORMAL HIGH (ref 65–99)

## 2015-07-24 LAB — BASIC METABOLIC PANEL
ANION GAP: 4 — AB (ref 5–15)
BUN: 24 mg/dL — ABNORMAL HIGH (ref 6–20)
CALCIUM: 8.9 mg/dL (ref 8.9–10.3)
CHLORIDE: 109 mmol/L (ref 101–111)
CO2: 26 mmol/L (ref 22–32)
Creatinine, Ser: 0.99 mg/dL (ref 0.61–1.24)
GFR calc Af Amer: >60 mL/min (ref >60)
GFR calc non Af Amer: >60 mL/min (ref >60)
GLUCOSE: 289 mg/dL — AB (ref 65–99)
Potassium: 4.7 mmol/L (ref 3.5–5.1)
Sodium: 139 mmol/L (ref 135–145)

## 2015-07-24 LAB — BRAIN NATRIURETIC PEPTIDE: B Natriuretic Peptide: 397 pg/mL — ABNORMAL HIGH (ref 0.0–100.0)

## 2015-07-24 MED ORDER — AMIODARONE HCL 200 MG PO TABS
400.0000 mg | ORAL_TABLET | Freq: Two times a day (BID) | ORAL | Status: DC
  Administered 2015-07-24 – 2015-07-28 (×9): 400 mg via ORAL
  Filled 2015-07-24 (×9): qty 2

## 2015-07-24 MED ORDER — LEVALBUTEROL HCL 1.25 MG/0.5ML IN NEBU: 1.2500 mg | INHALATION_SOLUTION | Freq: Once | RESPIRATORY_TRACT | Status: AC

## 2015-07-24 MED ORDER — GUAIFENESIN-CODEINE 100-10 MG/5ML PO SOLN
10.0000 mL | ORAL | Status: DC | PRN
  Administered 2015-07-25: 10 mL via ORAL
  Filled 2015-07-24: qty 10

## 2015-07-24 MED ORDER — AMIODARONE LOAD VIA INFUSION
150.0000 mg | Freq: Once | INTRAVENOUS | Status: AC
  Administered 2015-07-24: 150 mg via INTRAVENOUS
  Filled 2015-07-24 (×2): qty 83.34

## 2015-07-24 MED ORDER — LEVALBUTEROL HCL 1.25 MG/0.5ML IN NEBU
INHALATION_SOLUTION | RESPIRATORY_TRACT | Status: AC
  Administered 2015-07-24: 1.25 mg
  Filled 2015-07-24: qty 0.5

## 2015-07-24 NOTE — Plan of Care (Signed)
Problem: Cardiac: Goal: Ability to achieve and maintain adequate cardiopulmonary perfusion will improve Outcome: Not Progressing Notified by central monitoring of RVR and pauses this shift.  HR increases with activity when up at bedside to void.  Cardizem infusion continues.

## 2015-07-24 NOTE — Consult Note (Signed)
CARDIOLOGY CONSULT NOTE  Patient ID: Aniket Paye., MRN: [id number], DOB/AGE: 08/03/1945 70 y.o. Admit date: 07/23/2015 Date of Consult: 07/24/2015  Primary Physician: [name], MD Primary [place] Physician [facility]  Chief Complaint: SVT   HPI ALEXI GEIBEL Sr. is a 70 y.o. male  Following admission for shortness of breath. e has a history of known COPD. This was felt to be an exacerbation. He has received IV steroids and nebulizers but has remained tachycardic.   Heas a history of tachycardia, appropriately classified as atrial tachycardia with some posttermination pauses. He has been treated with beta blockers. He has not been on anticoagulation.   Past medical history is notable for coronary artery disease for which he has undergone bypass surgery-2014, diabetes, hypertension and dyslipidemia.  Most recent echocardiogram 5/15 demonstrated normal LV function with mild LAE   Past Medical History  Diagnosis Date  . Diabetes mellitus without complication ([place])   . Thyroid disease   . Collapse of right lung     a. 12/1967 - ? etiology.  [name] History of bronchitis   . Type II diabetes mellitus ([place])   . Hypothyroidism   . Arthritis     a. right knee  . Hypercholesteremia   . Shingles   . Hypertension   . Coronary artery disease     a. 05/2012 Cath: severe 3VD-->CABG by Dr. Servando Snare in 05/2012 with LIMA to LAD, SVG to LCX & SVG to RPDA.  [name] Asthma   . COPD (chronic obstructive pulmonary disease) [facility])       Surgical History:  Past Surgical History  Procedure Laterality Date  . Knee surgery  2003    right  . Neck surgery  2008    Plate in neck  . Knee arthroscopy  ~ 2000    "right" (05/06/2012)  . Anterior cervical decomp/discectomy fusion  ~ 2009  . Intraoperative transesophageal echocardiogram  05/07/2012    Procedure: INTRAOPERATIVE TRANSESOPHAGEAL ECHOCARDIOGRAM;  Surgeon: [name], MD;  Location: [place];  Service: Open Heart  Surgery;  Laterality: N/A;  . Coronary artery bypass graft  05/07/2012    Procedure: CORONARY ARTERY BYPASS GRAFTING (CABG);  Surgeon: [name], MD;  Location: [place];  Service: Open Heart Surgery;  Laterality: N/A;  times three  . Cardiac catheterization  05/06/2012    Significant three-vessel coronary artery disease with normal ejection fraction     Home Meds: Prior to Admission medications   Medication Sig Start Date End Date Taking? Authorizing Provider  albuterol (PROVENTIL) (2.5 MG/3ML) 0.083% nebulizer solution Take 2.5 mg by nebulization every 6 (six) hours as needed for wheezing or shortness of breath.   Yes Historical Provider, MD  atorvastatin (LIPITOR) 40 MG tablet TAKE ONE-HALF TABLET BY MOUTH AT BEDTIME 02/24/15  Yes Arlis Porta., MD  ELIQUIS 5 MG TABS tablet TAKE ONE TABLET BY MOUTH TWICE DAILY 06/14/15  Yes Minna Merritts, MD  glucose monitoring kit (FREESTYLE) monitoring kit 1 each by Does not apply route as needed for other. 05/17/12  Yes [name], MD  LANCETS ULTRA THIN MISC 1 Device by Does not apply route 4 (four) times daily -  before meals and at bedtime. 05/17/12  Yes [name], MD  levothyroxine (SYNTHROID, LEVOTHROID) 75 MCG tablet TAKE ONE TABLET BY MOUTH ONCE DAILY 02/08/15  Yes Arlis Porta., MD  losartan (COZAAR) 100 MG tablet TAKE ONE TABLET BY MOUTH ONCE DAILY 01/25/15  Yes Arlis Porta., MD  metFORMIN (GLUCOPHAGE) 500 MG tablet Take 2 tablets by mouth 2 (two) times daily. 06/23/15  Yes Historical Provider, MD  metoprolol (LOPRESSOR) 50 MG tablet TAKE ONE TABLET BY MOUTH TWICE DAILY 02/05/15  Yes [name], MD  montelukast (SINGULAIR) 10 MG tablet Take 10 mg by mouth at bedtime.  02/08/15  Yes Historical Provider, MD  ONE TOUCH ULTRA TEST test strip USE ONE STRIP TO CHECK GLUCOSE 1-2 TIMES DAILY 03/22/15  Yes Arlis Porta., MD  predniSONE (DELTASONE) 10 MG tablet Take 1 tablet by mouth daily. 07/13/15  Yes Historical  Provider, MD  PROAIR HFA 108 (90 BASE) MCG/ACT inhaler INHALE TWO PUFFS BY MOUTH 4 TIMES DAILY AS NEEDED 03/08/15  Yes Arlis Porta., MD  tiotropium (SPIRIVA) 18 MCG inhalation capsule Place 18 mcg into inhaler and inhale daily.  02/15/15 02/15/16 Yes Historical Provider, MD  fluticasone (FLOVENT HFA) 110 MCG/ACT inhaler Inhale 1-2 puffs into the lungs 2 (two) times daily. Patient not taking: Reported on 07/23/2015 02/10/15   Arlis Porta., MD  predniSONE (DELTASONE) 20 MG tablet Take 2 tablets (40 mg total) by mouth daily with breakfast. Patient not taking: Reported on 07/23/2015 03/09/15   Amy Overton Mam, NP    Inpatient Medications:  . atorvastatin  20 mg Oral QHS  . enoxaparin (LOVENOX) injection  40 mg Subcutaneous Q24H  . insulin aspart  0-20 Units Subcutaneous TID WC  . insulin aspart  0-5 Units Subcutaneous QHS  . levalbuterol  1.25 mg Nebulization QID  . levofloxacin (LEVAQUIN) IV  500 mg Intravenous Q24H  . levothyroxine  75 mcg Oral QAC breakfast  . losartan  100 mg Oral Daily  . metFORMIN  1,000 mg Oral BID WC  . methylPREDNISolone (SOLU-MEDROL) injection  60 mg Intravenous 3 times per day  . metoprolol  50 mg Oral BID  . montelukast  10 mg Oral QHS  . sodium chloride flush  3 mL Intravenous Q12H  . tiotropium  18 mcg Inhalation Daily    Allergies:  Allergies  Allergen Reactions  . Iodine Rash  . Penicillins Hives, Itching, Rash and Other (See Comments)    "broke out in welps" (05/06/2012)  . Sulfa Antibiotics Shortness Of Breath  . Iodine     Rash and itching  . Ivp Dye [Iodinated Diagnostic Agents]     Rash;redness  . Penicillins     Hives  . Sulfa Antibiotics     Difficulty Breathing    Social History   Social History  . Marital Status: Married    Spouse Name: N/A  . Number of Children: N/A  . Years of Education: N/A   Occupational History  . Not on file.   Social History Main Topics  . Smoking status: Former Smoker -- 1.00 packs/day for 8  years    Types: Cigarettes    Quit date: 12/31/1967  . Smokeless tobacco: Never Used  . Alcohol Use: No     Comment: 05/06/2012 "last alcohol several years ago; never had problem wit"  . Drug Use: No  . Sexual Activity: Not Currently   Other Topics Concern  . Not on file   Social History Narrative   ** Merged History Encounter **         Family History  Problem Relation Age of Onset  . Heart disease Mother   . Clotting disorder Mother   . Hypertension Sister      ROS:  Please  see the history of present illness.     All other systems reviewed and negative.    Physical Exam: Blood pressure 148/84, pulse 58, temperature 97.8 F (36.6 C), temperature source Oral, resp. rate 20, height 5' 11"  (1.803 m), weight 219 lb 4.8 oz (99.474 kg), SpO2 95 %. General: Well developed, well nourished male in no acute distress. Head: Normocephalic, atraumatic, sclera non-icteric, no xanthomas, nares are without discharge. EENT: normal  Lymph Nodes:  none Neck: Negative for carotid bruits. JVD not elevated. Back:without scoliosis kyphosis Lungs: Clear bilaterally to auscultation without wheezes, rales, or rhonchi. Breathing is unlabored. Heart:  Irregular RR with S1 S2. No  murmur . No rubs, or gallops appreciated. Abdomen: Soft, non-tender, non-distended with normoactive bowel sounds. No hepatomegaly. No rebound/guarding. No obvious abdominal masses. Msk:  Strength and tone appear normal for age. Extremities: No clubbing or cyanosis. 2+ edema.  Distal pedal pulses are 2+ and equal bilaterally. Skin: Warm and Dry Neuro: Alert and oriented X 3. CN III-XII intact Grossly normal sensory and motor function . Psych:  Responds to questions appropriately with a normal affect.      Labs: Cardiac Enzymes  Recent Labs  07/23/15 0717  TROPONINI <0.03   CBC Lab Results  Component Value Date   WBC 7.6 07/23/2015   HGB 15.1 07/23/2015   HCT 43.7 07/23/2015   MCV 90.6 07/23/2015   PLT 160  07/23/2015   PROTIME: No results for input(s): LABPROT, INR in the last 72 hours. Chemistry  Recent Labs Lab 07/24/15 0508  NA 139  K 4.7  CL 109  CO2 26  BUN 24*  CREATININE 0.99  CALCIUM 8.9  GLUCOSE 289*   Lipids Lab Results  Component Value Date   CHOL 139 12/14/2014   HDL 43 12/14/2014   LDLCALC 71 12/14/2014   TRIG 127 12/14/2014   BNP No results found for: PROBNP Thyroid Function Tests:  Recent Labs  07/23/15 0717  TSH 4.210   Miscellaneous No results found for: DDIMER  Radiology/Studies:  Nm Pulmonary Perf And Vent  07/23/2015  CLINICAL DATA:  Dyspnea and iodine allergy.  COPD. EXAM: NUCLEAR MEDICINE VENTILATION - PERFUSION LUNG SCAN TECHNIQUE: Ventilation images were obtained in multiple projections using inhaled aerosol Tc-72mDTPA. Perfusion images were obtained in multiple projections after intravenous injection of Tc-975mAA. RADIOPHARMACEUTICALS:  31.474 mCi Technetium-9960mPA aerosol inhalation and 4.024 mCi Technetium-14m46m IV COMPARISON:  Chest radiograph 07/23/2015 FINDINGS: Ventilation: Diffusely heterogeneous tracer activity throughout both lungs with pooling of activity in the tracheobronchial tree. Appearance is consistent with emphysema and air trapping. Perfusion: Mildly heterogeneous distribution tracer activity throughout both lungs in a nonsegmental pattern likely related to emphysema and air trapping. No focal segmental perfusion defects identified. IMPRESSION: Low probability of pulmonary embolus.  Emphysematous changes. Electronically Signed   By: WillLucienne Capers.   On: 07/23/2015 20:58   Dg Chest Portable 1 View  07/23/2015  CLINICAL DATA:  Shortness of Breath EXAM: PORTABLE CHEST 1 VIEW COMPARISON:  03/09/2015 FINDINGS: The heart size and mediastinal contours are within normal limits. Both lungs are clear. The visualized skeletal structures are unremarkable. IMPRESSION: No active disease. Electronically Signed   By: MarkInez Catalina.    On: 07/23/2015 07:26    EKG:     Assessment and Plan:   Atrial tachycardia  Coronary artery disease with prior bypass surgery normal LV function  COPD with current exacerbation  HFpEF  Recurrent atrial tach with post termination pauses can either be  primary or secondary to his SOB although with the three moth progressive history and teh cough reminiscent of his COPD I suspect it is secondary but contributing  Rhtyhm control options are limited by his CAD and so will use Amio, notwithstanding his young age, realizing that catheter ablation is an option in the future.  He did much better following infusion of the amio so will begin him on 400 bid and hopefully can be discharged 24-48 hrs, though i suspect 48  I am also concerned as to the cause of his progressive SOB over the last few months, and would recommend myoview.   Post termination pauses may be aggravated by amiodarone, but at this point they are too brief to be symtomatic  Will cehck BNP  IV diuresis    Virl Axe

## 2015-07-24 NOTE — Progress Notes (Signed)
Philip [name] at [place] NAME: Philip [name]    MR#:  [id number]  DATE OF BIRTH:  [birth year]  SUBJECTIVE:  Continues to complain of cough minimal production as well as shortness of breath worsened by coughing Overnight noted to be in atrial fibrillation rapid ventricular response early on Cardizem heart rate still not controlled this morning  REVIEW OF SYSTEMS:  CONSTITUTIONAL: No fever, fatigue or weakness.  EYES: No blurred or double vision.  EARS, NOSE, AND THROAT: No tinnitus or ear pain.  RESPIRATORY: Positive cough, shortness of breath, wheezing denies hemoptysis.  CARDIOVASCULAR: No chest pain, orthopnea, edema.  GASTROINTESTINAL: No nausea, vomiting, diarrhea or abdominal pain.  GENITOURINARY: No dysuria, hematuria.  ENDOCRINE: No polyuria, nocturia,  HEMATOLOGY: No anemia, easy bruising or bleeding SKIN: No rash or lesion. MUSCULOSKELETAL: No joint pain or arthritis.   NEUROLOGIC: No tingling, numbness, weakness.  PSYCHIATRY: No anxiety or depression.   DRUG ALLERGIES:   Allergies  Allergen Reactions  . Iodine Rash  . Penicillins Hives, Itching, Rash and Other (See Comments)    "broke out in welps" (05/06/2012)  . Sulfa Antibiotics Shortness Of Breath  . Iodine     Rash and itching  . Ivp Dye [Iodinated Diagnostic Agents]     Rash;redness  . Penicillins     Hives  . Sulfa Antibiotics     Difficulty Breathing    VITALS:  Blood pressure 148/84, pulse 78, temperature 97.8 F (36.6 C), temperature source Oral, resp. rate 20, height 5\' 11"  (1.803 m), weight 99.474 kg (219 lb 4.8 oz), SpO2 95 %.  PHYSICAL EXAMINATION:  VITAL SIGNS: Filed Vitals:   07/24/15 0957 07/24/15 1105  BP: 138/103 148/84  Pulse: 141 78  Temp:    Resp: 20    GENERAL:70 y.o.male currently in no acute distress.  HEAD: Normocephalic, atraumatic.  EYES: Pupils equal, round, reactive to light. Extraocular muscles intact. No scleral icterus.   MOUTH: Moist mucosal membrane. Dentition intact. No abscess noted.  EAR, NOSE, THROAT: Clear without exudates. No external lesions.  NECK: Supple. No thyromegaly. No nodules. No JVD.  PULMONARY: Diffuse coarse rhonchi scattered wheezingNo use of accessory muscles, Good respiratory effort. good air entry bilaterally CHEST: Nontender to palpation.  CARDIOVASCULAR: S1 and S2. Irregular rate and irregular rhythm. No murmurs, rubs, or gallops. No edema. Pedal pulses 2+ bilaterally.  GASTROINTESTINAL: Soft, nontender, nondistended. No masses. Positive bowel sounds. No hepatosplenomegaly.  MUSCULOSKELETAL: No swelling, clubbing, or edema. Range of motion full in all extremities.  NEUROLOGIC: Cranial nerves II through XII are intact. No gross focal neurological deficits. Sensation intact. Reflexes intact.  SKIN: No ulceration, lesions, rashes, or cyanosis. Skin warm and dry. Turgor intact.  PSYCHIATRIC: Mood, affect within normal limits. The patient is awake, alert and oriented x 3. Insight, judgment intact.      LABORATORY PANEL:   CBC  Recent Labs Lab 07/23/15 0717  WBC 7.6  HGB 15.1  HCT 43.7  PLT 160   ------------------------------------------------------------------------------------------------------------------  Chemistries   Recent Labs Lab 07/24/15 0508  NA 139  K 4.7  CL 109  CO2 26  GLUCOSE 289*  BUN 24*  CREATININE 0.99  CALCIUM 8.9   ------------------------------------------------------------------------------------------------------------------  Cardiac Enzymes  Recent Labs Lab 07/23/15 0717  TROPONINI <0.03   ------------------------------------------------------------------------------------------------------------------  RADIOLOGY:  Nm Pulmonary Perf And Vent  07/23/2015  CLINICAL DATA:  Dyspnea and iodine allergy.  COPD. EXAM: NUCLEAR MEDICINE VENTILATION - PERFUSION LUNG SCAN TECHNIQUE: Ventilation images were obtained in  multiple projections  using inhaled aerosol Tc-67m DTPA. Perfusion images were obtained in multiple projections after intravenous injection of Tc-52m MAA. RADIOPHARMACEUTICALS:  31.474 mCi Technetium-80m DTPA aerosol inhalation and 4.024 mCi Technetium-24m MAA IV COMPARISON:  Chest radiograph 07/23/2015 FINDINGS: Ventilation: Diffusely heterogeneous tracer activity throughout both lungs with pooling of activity in the tracheobronchial tree. Appearance is consistent with emphysema and air trapping. Perfusion: Mildly heterogeneous distribution tracer activity throughout both lungs in a nonsegmental pattern likely related to emphysema and air trapping. No focal segmental perfusion defects identified. IMPRESSION: Low probability of pulmonary embolus.  Emphysematous changes. Electronically Signed   By: Lucienne Capers M.D.   On: 07/23/2015 20:58   Dg Chest Portable 1 View  07/23/2015  CLINICAL DATA:  Shortness of Breath EXAM: PORTABLE CHEST 1 VIEW COMPARISON:  03/09/2015 FINDINGS: The heart size and mediastinal contours are within normal limits. Both lungs are clear. The visualized skeletal structures are unremarkable. IMPRESSION: No active disease. Electronically Signed   By: Inez Catalina M.D.   On: 07/23/2015 07:26    EKG:   Orders placed or performed during the hospital encounter of 07/23/15  . EKG 12-Lead  . EKG 12-Lead  . ED EKG within 10 minutes  . ED EKG within 10 minutes    ASSESSMENT AND PLAN:   70 year old Caucasian gentleman admitted 07/23/15 with COPD exacerbation as well as rapid atrial fibrillation  1.Chronic obstructive pulmonary disease exacerbation: Breathing treatments Solu-Medrol 60 mg IV q. daily, continue Levaquin. Continue with home medications.  2. Atrial fibrillation rapid ventricular response: Currently on Cardizem drip without much improvement case discussed with cardiology and nursing staff will perform one dose amiodarone bolus to see if control is required we will start amiodarone infusion,  discontinue Cardizem 3. Hypothyroidism unspecified Synthroid 4. Type 2 diabetes non-insulin-requiring: Continue with insulin sliding scale 5. Venous thromboembolism prophylactic: Lovenox       All the records are reviewed and case discussed with Care Management/Social Workerr. Management plans discussed with the patient, family and they are in agreement.  CODE STATUS: Full  TOTAL TIME TAKING CARE OF THIS PATIENT: 33 minutes.   POSSIBLE D/C IN 2-3 DAYS, DEPENDING ON CLINICAL CONDITION.   Hower,  Karenann Cai.D on 07/24/2015 at 12:10 PM  Between 7am to 6pm - Pager - [phone]  After 6pm: House Pager: - [phone]  [facility]  Office  [phone]  CC: Primary care physician; [name], MD

## 2015-07-24 NOTE — Progress Notes (Signed)
Patient has been alert and oriented. No complaints of pain. Since breathing treatments given and amiodarone started, patient has had no complaints and states he feels much better standing up to use the restroom. After giving amiodarone education, patient questioned primary RN about reaction between amiodarone and iodine since patient has an allergy to iodine. Consulted with pharmacist and they stated if patient has not had a reaction after receiving IV amio, he should be okay, but will need to consult with Dr. Caryl Comes in the AM to see if this is a medication patient should go home on. Patient states his allergy to iodine is from the stress test medication injected and he broke out in hives all over his body, no anaphylactic response. Patient will talk with Dr. Caryl Comes about the benefits verses risks tomorrow. Instructed to let nursing staff know if he notices any rash develop. Will continue to monitor.

## 2015-07-24 NOTE — Progress Notes (Addendum)
Amiodarone bolus given over 10 minutes. Stayed with patient through the whole infusion. No adverse symptoms. Vitals are stable. Pre-med BP 133/83 HR running anywhere from 140's down to 90's. Post med BP 138/103 and heart rate has come down to the 80's and 90's consistently but is still going to the 120's for brief moments. Patient is resting in bed and has no complaints. Will continue to monitor on tele and update cardiology when they round or if needed will page them. Amiodarone not started at scheduled time due to pharmacy delay.

## 2015-07-24 NOTE — Progress Notes (Signed)
Patient is currently on cardizem running at 5mg /hr. Rhythm is sinus with a lot of PAC's as well as pauses that are lasting no more than 1.5 seconds per CCMD. Heart rate is ranging anywhere from 88 to 126. HR never maintains above 105 for more than a couple second and goes right back down. Patient is currently complaining of respiratory distress and states he needs another breathing treatment. Oxygen saturation is currently 94% on 2 liters. Respiratory contacted and is coming to do his scheduled 8:00 treatment. BP is elevated at this time due to SOB. Will page cardiology with updates on telemetry.

## 2015-07-24 NOTE — Plan of Care (Signed)
Problem: Cardiac: Goal: Ability to achieve and maintain adequate cardiopulmonary perfusion will improve Outcome: Progressing SR with frequent PACs, with runs Afib noted on telemetry. Tolerating cardizem infusion.  Audible wheezing continues after nebulizer given;pt reports breathing better.

## 2015-07-24 NOTE — Progress Notes (Signed)
Spoke to Dr. Caryl Comes about patient's heart rate and rhythm. MD stated to take patient off cardizem drip and run IV amiodarone. MD put in for a bolus for 10 minutes. Pharmacy has been called for dose. Will stop cardizem once I receive amio bag. Dr. Lavetta Nielsen is on the floor and has been updated as well. Cardiology will be by to see patient.

## 2015-07-24 NOTE — Progress Notes (Signed)
Spoke to Dr. Caryl Comes and MD recommended for patient to take first dose of PO amiodarone and get another dose this evening. Patient will now be on 400mg  amiodarone bid. Patient has been educated on medication and questions have been answered. Heart rate is still irregular but is staying under 100 since IV amiodarone has been given.

## 2015-07-25 ENCOUNTER — Inpatient Hospital Stay
Admit: 2015-07-25 | Discharge: 2015-07-25 | Disposition: A | Discharge status: Home / Self Care | Payer: Medicare Other | Attending: Internal Medicine | Admitting: Internal Medicine

## 2015-07-25 DIAGNOSIS — I959 Hypotension, unspecified: Secondary | ICD-10-CM

## 2015-07-25 DIAGNOSIS — I5030 Unspecified diastolic (congestive) heart failure: Secondary | ICD-10-CM

## 2015-07-25 DIAGNOSIS — J438 Other emphysema: Secondary | ICD-10-CM

## 2015-07-25 DIAGNOSIS — I509 Heart failure, unspecified: Secondary | ICD-10-CM | POA: Diagnosis not present

## 2015-07-25 LAB — BASIC METABOLIC PANEL
Anion gap: 12 (ref 5–15)
BUN: 51 mg/dL — ABNORMAL HIGH (ref 6–20)
CHLORIDE: 101 mmol/L (ref 101–111)
CO2: 20 mmol/L — AB (ref 22–32)
CREATININE: 1.41 mg/dL — AB (ref 0.61–1.24)
Calcium: 9 mg/dL (ref 8.9–10.3)
GFR calc non Af Amer: 50 mL/min — ABNORMAL LOW (ref >60)
GFR, EST AFRICAN AMERICAN: 58 mL/min — AB (ref >60)
Glucose, Bld: 299 mg/dL — ABNORMAL HIGH (ref 65–99)
POTASSIUM: 3.7 mmol/L (ref 3.5–5.1)
Sodium: 133 mmol/L — ABNORMAL LOW (ref 135–145)

## 2015-07-25 LAB — MAGNESIUM: Magnesium: 1.9 mg/dL (ref 1.7–2.4)

## 2015-07-25 LAB — GLUCOSE, CAPILLARY
GLUCOSE-CAPILLARY: 284 mg/dL — AB (ref 65–99)
GLUCOSE-CAPILLARY: 237 mg/dL — AB (ref 65–99)
Glucose-Capillary: 299 mg/dL — ABNORMAL HIGH (ref 65–99)
Glucose-Capillary: 301 mg/dL — ABNORMAL HIGH (ref 65–99)

## 2015-07-25 MED ORDER — METHYLPREDNISOLONE SODIUM SUCC 125 MG IJ SOLR
60.0000 mg | INTRAMUSCULAR | Status: DC
  Administered 2015-07-26 – 2015-07-28 (×3): 60 mg via INTRAVENOUS
  Filled 2015-07-25 (×3): qty 2

## 2015-07-25 MED ORDER — IPRATROPIUM-ALBUTEROL 0.5-2.5 (3) MG/3ML IN SOLN: 3.0000 mL | Freq: Four times a day (QID) | RESPIRATORY_TRACT | Status: DC

## 2015-07-25 MED ORDER — LOSARTAN POTASSIUM 50 MG PO TABS: 50.0000 mg | ORAL_TABLET | Freq: Every day | ORAL | Status: DC

## 2015-07-25 MED ORDER — ALBUTEROL SULFATE (2.5 MG/3ML) 0.083% IN NEBU
2.5000 mg | INHALATION_SOLUTION | Freq: Four times a day (QID) | RESPIRATORY_TRACT | Status: DC
Start: 2015-07-25 — End: 2015-07-25
  Administered 2015-07-25: 2.5 mg via RESPIRATORY_TRACT
  Filled 2015-07-25: qty 3

## 2015-07-25 MED ORDER — LEVALBUTEROL HCL 1.25 MG/0.5ML IN NEBU
INHALATION_SOLUTION | RESPIRATORY_TRACT | Status: AC
  Filled 2015-07-25: qty 0.5

## 2015-07-25 MED ORDER — LEVALBUTEROL HCL 1.25 MG/0.5ML IN NEBU
1.2500 mg | INHALATION_SOLUTION | RESPIRATORY_TRACT | Status: DC | PRN
  Administered 2015-07-25 – 2015-07-28 (×7): 1.25 mg via RESPIRATORY_TRACT
  Filled 2015-07-25 (×9): qty 0.5

## 2015-07-25 MED ORDER — ALBUTEROL SULFATE (2.5 MG/3ML) 0.083% IN NEBU: 2.5000 mg | INHALATION_SOLUTION | RESPIRATORY_TRACT | Status: DC | PRN

## 2015-07-25 MED ORDER — LEVALBUTEROL HCL 1.25 MG/0.5ML IN NEBU
1.2500 mg | INHALATION_SOLUTION | Freq: Four times a day (QID) | RESPIRATORY_TRACT | Status: DC
  Administered 2015-07-25 (×2): 1.25 mg via RESPIRATORY_TRACT
  Filled 2015-07-25: qty 0.5

## 2015-07-25 MED ORDER — METOPROLOL TARTRATE 25 MG PO TABS
25.0000 mg | ORAL_TABLET | Freq: Once | ORAL | Status: AC
  Administered 2015-07-25: 25 mg via ORAL
  Filled 2015-07-25: qty 1

## 2015-07-25 MED ORDER — METOPROLOL TARTRATE 25 MG PO TABS
25.0000 mg | ORAL_TABLET | Freq: Two times a day (BID) | ORAL | Status: DC
  Administered 2015-07-25 – 2015-07-26 (×2): 25 mg via ORAL
  Filled 2015-07-25 (×2): qty 1

## 2015-07-25 MED ORDER — FLUTICASONE PROPIONATE 50 MCG/ACT NA SUSP
1.0000 | Freq: Every day | NASAL | Status: DC
  Administered 2015-07-25 – 2015-07-29 (×5): 1 via NASAL
  Filled 2015-07-25 (×2): qty 16

## 2015-07-25 MED ORDER — LOSARTAN POTASSIUM 50 MG PO TABS
50.0000 mg | ORAL_TABLET | Freq: Every day | ORAL | Status: DC
Start: 2015-07-25 — End: 2015-07-28
  Administered 2015-07-25 – 2015-07-27 (×3): 50 mg via ORAL
  Filled 2015-07-25 (×3): qty 1

## 2015-07-25 MED ORDER — LOSARTAN POTASSIUM 50 MG PO TABS: 100.0000 mg | ORAL_TABLET | Freq: Every day | ORAL | Status: DC

## 2015-07-25 MED ORDER — IPRATROPIUM-ALBUTEROL 0.5-2.5 (3) MG/3ML IN SOLN: 3.0000 mL | RESPIRATORY_TRACT | Status: DC | PRN

## 2015-07-25 NOTE — Progress Notes (Addendum)
Patient Name: Philip PALMATIER Sr.      SUBJECTIVE: Admitted with CHF and COPD exacerbation complicated by frequent runs of atrial tachycardia which we treated yesterday with amiodarone;  BNP ordered yday 397  Last night struggled with SOB responsive to nebs  Echo 5/15 normal LV function   Repeat pending  Past Medical History  Diagnosis Date  . Diabetes mellitus without complication ([place])   . Thyroid disease   . Collapse of right lung     a. 12/1967 - ? etiology.  [name] History of bronchitis   . Type II diabetes mellitus ([place])   . Hypothyroidism   . Arthritis     a. right knee  . Hypercholesteremia   . Shingles   . Hypertension   . Coronary artery disease     a. 05/2012 Cath: severe 3VD-->CABG by Dr. Servando Snare in 05/2012 with LIMA to LAD, SVG to LCX & SVG to RPDA.  [name] Asthma   . COPD (chronic obstructive pulmonary disease) (H[facility])     Scheduled Meds:  Scheduled Meds: . albuterol  2.5 mg Nebulization QID  . amiodarone  400 mg Oral BID  . atorvastatin  20 mg Oral QHS  . enoxaparin (LOVENOX) injection  40 mg Subcutaneous Q24H  . insulin aspart  0-20 Units Subcutaneous TID WC  . insulin aspart  0-5 Units Subcutaneous QHS  . levalbuterol      . levofloxacin (LEVAQUIN) IV  500 mg Intravenous Q24H  . levothyroxine  75 mcg Oral QAC breakfast  . [START ON 07/26/2015] losartan  100 mg Oral Daily  . metFORMIN  1,000 mg Oral BID WC  . [START ON 07/26/2015] methylPREDNISolone (SOLU-MEDROL) injection  60 mg Intravenous Q24H  . metoprolol  50 mg Oral BID  . montelukast  10 mg Oral QHS  . sodium chloride flush  3 mL Intravenous Q12H  . tiotropium  18 mcg Inhalation Daily   Continuous Infusions:  acetaminophen **OR** acetaminophen, albuterol, guaiFENesin-codeine, HYDROcodone-acetaminophen, ondansetron **OR** ondansetron (ZOFRAN) IV, senna-docusate    PHYSICAL EXAM Filed Vitals:   07/25/15 0423 07/25/15 0826 07/25/15 1016 07/25/15 1107  BP: 125/46  118/51   Pulse: 56  91     Temp:      TempSrc:      Resp:   20   Height:      Weight:      SpO2: 96% 94% 94% 93%   Well developed and nourished in no acute distress HENT normal Neck supple with JVP-flat Carotids brisk and full without bruits wheezes irregular rate and rhythm, no murmurs or gallops Abd-soft with active BS without hepatomegaly No Clubbing cyanosis tr edema Skin-warm and dry A & Oriented  Grossly normal sensory and motor function   TELEMETRY: Reviewed telemetry pt in  Sinus with freq non sustained atrial tach:    Intake/Output Summary (Last 24 hours) at 07/25/15 1116 Last data filed at 07/25/15 0831  Gross per 24 hour  Intake    720 ml  Output    250 ml  Net    470 ml    LABS: Basic Metabolic Panel:  Recent Labs Lab 07/23/15 0717 07/24/15 0508  NA 137 139  K 3.8 4.7  CL 107 109  CO2 24 26  GLUCOSE 166* 289*  BUN 20 24*  CREATININE 0.97 0.99  CALCIUM 8.6* 8.9   Cardiac Enzymes:  Recent Labs  07/23/15 0717  TROPONINI <0.03   CBC:  Recent Labs Lab 07/23/15 0717  WBC 7.6  HGB 15.1  HCT 43.7  MCV 90.6  PLT 160   PROTIME: No results for input(s): LABPROT, INR in the last 72 hours. Liver Function Tests: No results for input(s): AST, ALT, ALKPHOS, BILITOT, PROT, ALBUMIN in the last 72 hours. No results for input(s): LIPASE, AMYLASE in the last 72 hours. BNP: BNP (last 3 results)  Recent Labs  07/24/15 0509  BNP 397.0*    ProBNP (last 3 results) No results for input(s): PROBNP in the last 8760 hours.  D-Dimer: No results for input(s): DDIMER in the last 72 hours. Hemoglobin A1C: No results for input(s): HGBA1C in the last 72 hours. Fasting Lipid Panel:    ASSESSMENT AND PLAN:  Principal Problem:   Atrial tachycardia (HCC) Active Problems:   Coronary artery disease   Bradycardia   COPD (chronic obstructive pulmonary disease) (HCC)   (HFpEF) heart failure with preserved ejection fraction (HCC)   Hypotension  Continue amio  With low  BP-nwe  will decrease metoprolol and move losartan to pm  Would continue xoponex as opposed to albuterol given ongoing problems with ectopy  Will order echo today to get some sense of LVEF  Will need stress test but currently would not be able to tolerate lexi/   Signed, Virl Axe MD  07/25/2015

## 2015-07-25 NOTE — Progress Notes (Signed)
Pt resting quietly first part of shift, denied pain. Pt up to bathroom at 0300, became SOB with audible wheezing. Pt reported he felt the SOB came on after a coughing spell. Pt requested neb treatment. RT notified and reported order was QID only. Dr Jannifer Franklin notified, order obtained for stat nebulizer treatment to be given. Pt had neb treatment and guaifenesin. Pt had been off 02 at the time Sats were 89%. Placed back on 02 @2L , sats returned to 94%. Pt able to obtain relief and reported decreased tightness in his throat/chest. Cont to have wheezing.

## 2015-07-25 NOTE — Progress Notes (Signed)
Spoke with Dr. Caryl Comes about patient's hypotension this AM and his BP meds. MD stated he would be changing his metoprolol to 25mg  bid instead of 50mg . Also stated to give losartan later today if BP is okay. Amiodarone has been given and MD stated there is no concern with reaction between iodine allergy and taking amio since the iodine reaction is usually to contrast. Will continue to monitor.

## 2015-07-25 NOTE — Progress Notes (Addendum)
Patient's heart rate is starting to range from 100's to 140's again. Patient is asymptomatic. Called by CCMD to inform me that his QRS was widening. Dr. Lavetta Nielsen notified and MD stated to give his 25mg  of metoprolol now and if his blood pressure is okay, night shift can give the night time dose of 25mg  as well. Will give and continue to monitor on tele. MD also stated to order a BNP with add on magnesium labs.

## 2015-07-25 NOTE — Progress Notes (Signed)
[facility] [place] NAME: Philip [name]    MR#:  [id number]  DATE OF BIRTH:  March 07, 1946  SUBJECTIVE:  Had episode of coughing followed by shortness of breath overnight States feels a little worse today but cough improved  REVIEW OF SYSTEMS:  CONSTITUTIONAL: No fever, fatigue or weakness.  EYES: No blurred or double vision.  EARS, NOSE, AND THROAT: No tinnitus or ear pain.  RESPIRATORY: Positive cough, shortness of breath, wheezing denies hemoptysis.  CARDIOVASCULAR: No chest pain, orthopnea, edema.  GASTROINTESTINAL: No nausea, vomiting, diarrhea or abdominal pain.  GENITOURINARY: No dysuria, hematuria.  ENDOCRINE: No polyuria, nocturia,  HEMATOLOGY: No anemia, easy bruising or bleeding SKIN: No rash or lesion. MUSCULOSKELETAL: No joint pain or arthritis.   NEUROLOGIC: No tingling, numbness, weakness.  PSYCHIATRY: No anxiety or depression.   DRUG ALLERGIES:   Allergies  Allergen Reactions  . Iodine Rash  . Penicillins Hives, Itching, Rash and Other (See Comments)    "broke out in welps" (05/06/2012)  . Sulfa Antibiotics Shortness Of Breath  . Iodine     Rash and itching  . Ivp Dye [Iodinated Diagnostic Agents]     Rash;redness  . Penicillins     Hives  . Sulfa Antibiotics     Difficulty Breathing    VITALS:  Blood pressure 103/63, pulse 156, temperature 97.4 F (36.3 C), temperature source Oral, resp. rate 20, height 5\' 11"  (1.803 m), weight 99.474 kg (219 lb 4.8 oz), SpO2 94 %.  PHYSICAL EXAMINATION:  VITAL SIGNS: Filed Vitals:   07/25/15 1016 07/25/15 1224  BP: 118/51 103/63  Pulse: 91 156  Temp:  97.4 F (36.3 C)  Resp: 20 20   GENERAL:69 y.o.male currently in no acute distress.  HEAD: Normocephalic, atraumatic.  EYES: Pupils equal, round, reactive to light. Extraocular muscles intact. No scleral icterus.  MOUTH: Moist mucosal membrane. Dentition intact. No abscess noted.  EAR, NOSE, THROAT: Clear  without exudates. No external lesions.  NECK: Supple. No thyromegaly. No nodules. No JVD.  PULMONARY: now only scant expiratory wheeze No use of accessory muscles, Good respiratory effort. good air entry bilaterally CHEST: Nontender to palpation.  CARDIOVASCULAR: S1 and S2. Irregular rate and irregular rhythm. No murmurs, rubs, or gallops. No edema. Pedal pulses 2+ bilaterally.  GASTROINTESTINAL: Soft, nontender, nondistended. No masses. Positive bowel sounds. No hepatosplenomegaly.  MUSCULOSKELETAL: No swelling, clubbing, or edema. Range of motion full in all extremities.  NEUROLOGIC: Cranial nerves II through XII are intact. No gross focal neurological deficits. Sensation intact. Reflexes intact.  SKIN: No ulceration, lesions, rashes, or cyanosis. Skin warm and dry. Turgor intact.  PSYCHIATRIC: Mood, affect within normal limits. The patient is awake, alert and oriented x 3. Insight, judgment intact.      LABORATORY PANEL:   CBC  Recent Labs Lab 07/23/15 0717  WBC 7.6  HGB 15.1  HCT 43.7  PLT 160   ------------------------------------------------------------------------------------------------------------------  Chemistries   Recent Labs Lab 07/24/15 0508  NA 139  K 4.7  CL 109  CO2 26  GLUCOSE 289*  BUN 24*  CREATININE 0.99  CALCIUM 8.9   ------------------------------------------------------------------------------------------------------------------  Cardiac Enzymes  Recent Labs Lab 07/23/15 0717  TROPONINI <0.03   ------------------------------------------------------------------------------------------------------------------  RADIOLOGY:  Nm Pulmonary Perf And Vent  07/23/2015  CLINICAL DATA:  Dyspnea and iodine allergy.  COPD. EXAM: NUCLEAR MEDICINE VENTILATION - PERFUSION LUNG SCAN TECHNIQUE: Ventilation images were obtained in multiple projections using inhaled aerosol Tc-46m DTPA. Perfusion images were obtained in multiple  projections after intravenous  injection of Tc-59m MAA. RADIOPHARMACEUTICALS:  31.474 mCi Technetium-70m DTPA aerosol inhalation and 4.024 mCi Technetium-29m MAA IV COMPARISON:  Chest radiograph 07/23/2015 FINDINGS: Ventilation: Diffusely heterogeneous tracer activity throughout both lungs with pooling of activity in the tracheobronchial tree. Appearance is consistent with emphysema and air trapping. Perfusion: Mildly heterogeneous distribution tracer activity throughout both lungs in a nonsegmental pattern likely related to emphysema and air trapping. No focal segmental perfusion defects identified. IMPRESSION: Low probability of pulmonary embolus.  Emphysematous changes. Electronically Signed   By: Lucienne Capers M.D.   On: 07/23/2015 20:58    EKG:   Orders placed or performed during the hospital encounter of 07/23/15  . EKG 12-Lead  . EKG 12-Lead  . ED EKG within 10 minutes  . ED EKG within 10 minutes    ASSESSMENT AND PLAN:   70 year old Caucasian gentleman admitted 07/23/15 with COPD exacerbation as well as rapid atrial fibrillation  1.Chronic obstructive pulmonary disease exacerbation: Breathing treatments Solu-Medrol 60 mg IV q. daily, continue Levaquin 3/5. Continue with home medications.  2. Atrial fibrillation rapid ventricular response: Currently on Cardizem drip without much improvement case discussed with cardiology and nursing staff will perform one dose amiodarone bolus to see if control is required we will start amiodarone infusion, discontinue Cardizem 3. Hypothyroidism unspecified Synthroid 4. Type 2 diabetes non-insulin-requiring: poor controlled on steroids :increase insulin sliding scale suspect improvement with decrease steroids - if not will require lantus 5. Venous thromboembolism prophylactic: Lovenox       All the records are reviewed and case discussed with Care Management/Social Workerr. Management plans discussed with the patient, family and they are in agreement.  CODE STATUS:  Full  TOTAL TIME TAKING CARE OF THIS PATIENT: 28 minutes.   POSSIBLE D/C IN 2-3 DAYS, DEPENDING ON CLINICAL CONDITION.   Hower,  Karenann Cai.D on 07/25/2015 at 3:23 PM  Between 7am to 6pm - Pager - [phone]  After 6pm: House Pager: - [phone]  [facility]  Office  [phone]  CC: Primary care physician; [name], MD

## 2015-07-25 NOTE — Progress Notes (Signed)
Heart rate is now maintained after giving PO metoprolol. Still irregular, but under 105.

## 2015-07-26 DIAGNOSIS — I2510 Atherosclerotic heart disease of native coronary artery without angina pectoris: Secondary | ICD-10-CM

## 2015-07-26 DIAGNOSIS — I471 Supraventricular tachycardia: Secondary | ICD-10-CM

## 2015-07-26 DIAGNOSIS — J441 Chronic obstructive pulmonary disease with (acute) exacerbation: Principal | ICD-10-CM

## 2015-07-26 DIAGNOSIS — I5030 Unspecified diastolic (congestive) heart failure: Secondary | ICD-10-CM

## 2015-07-26 LAB — GLUCOSE, CAPILLARY
GLUCOSE-CAPILLARY: 218 mg/dL — AB (ref 65–99)
GLUCOSE-CAPILLARY: 197 mg/dL — AB (ref 65–99)
Glucose-Capillary: 294 mg/dL — ABNORMAL HIGH (ref 65–99)
Glucose-Capillary: 200 mg/dL — ABNORMAL HIGH (ref 65–99)

## 2015-07-26 LAB — CREATININE, SERUM: Creatinine, Ser: 1.11 mg/dL (ref 0.61–1.24)

## 2015-07-26 LAB — CBC
HEMATOCRIT: 45.2 % (ref 40.0–52.0)
HEMOGLOBIN: 15.4 g/dL (ref 13.0–18.0)
MCH: 31 pg (ref 26.0–34.0)
MCHC: 34 g/dL (ref 32.0–36.0)
MCV: 91.2 fL (ref 80.0–100.0)
Platelets: 200 10*3/uL (ref 150–440)
RBC: 4.95 MIL/uL (ref 4.40–5.90)
RDW: 13.9 % (ref 11.5–14.5)
WBC: 19.6 10*3/uL — ABNORMAL HIGH (ref 3.8–10.6)

## 2015-07-26 LAB — ECHOCARDIOGRAM COMPLETE
HEIGHTINCHES: 71 in
Weight: 3508.8 oz

## 2015-07-26 MED ORDER — FUROSEMIDE 10 MG/ML IJ SOLN
40.0000 mg | Freq: Every day | INTRAMUSCULAR | Status: DC | PRN
  Administered 2015-07-27: 40 mg via INTRAVENOUS
  Filled 2015-07-26 (×3): qty 4

## 2015-07-26 MED ORDER — METOPROLOL TARTRATE 25 MG PO TABS
25.0000 mg | ORAL_TABLET | Freq: Four times a day (QID) | ORAL | Status: DC
  Administered 2015-07-26 – 2015-07-27 (×4): 25 mg via ORAL
  Filled 2015-07-26 (×4): qty 1

## 2015-07-26 MED ORDER — INSULIN DETEMIR 100 UNIT/ML ~~LOC~~ SOLN
15.0000 [IU] | Freq: Every day | SUBCUTANEOUS | Status: DC
  Administered 2015-07-26 – 2015-07-28 (×3): 15 [IU] via SUBCUTANEOUS
  Filled 2015-07-26 (×4): qty 0.15

## 2015-07-26 NOTE — Clinical Documentation Improvement (Signed)
Cardiology Internal Medicine  Please clarify the likely etiology of the patient's Hypotension and document findings in next progress note. Thank you!    Drug related - please identify source  Other Condition  Clinically Undetermined  Supporting Information:  Was on Cardizem drip for atrial tachycardia  Please exercise your independent, professional judgment when responding. A specific answer is not anticipated or expected.  Thank You, Zoila Shutter RN, BSN, [place] [phone]; Cell: [phone]

## 2015-07-26 NOTE — Progress Notes (Signed)
[facility] NAME: Philip [name]    MR#:  [id number]  DATE OF BIRTH:  10/01/1945  SUBJECTIVE:  Continued slow improvement remains on supplemental oxygen history with tachycardia overnight  REVIEW OF SYSTEMS:  CONSTITUTIONAL: No fever, positive fatigue or weakness.  EYES: No blurred or double vision.  EARS, NOSE, AND THROAT: No tinnitus or ear pain.  RESPIRATORY: Positive cough, shortness of breath, wheezing denies hemoptysis.  CARDIOVASCULAR: No chest pain, orthopnea, edema.  GASTROINTESTINAL: No nausea, vomiting, diarrhea or abdominal pain.  GENITOURINARY: No dysuria, hematuria.  ENDOCRINE: No polyuria, nocturia,  HEMATOLOGY: No anemia, easy bruising or bleeding SKIN: No rash or lesion. MUSCULOSKELETAL: No joint pain or arthritis.   NEUROLOGIC: No tingling, numbness, weakness.  PSYCHIATRY: No anxiety or depression.   DRUG ALLERGIES:   Allergies  Allergen Reactions  . Iodine Rash  . Penicillins Hives, Itching, Rash and Other (See Comments)    "broke out in welps" (05/06/2012)  . Sulfa Antibiotics Shortness Of Breath  . Iodine     Rash and itching  . Ivp Dye [Iodinated Diagnostic Agents]     Rash;redness  . Penicillins     Hives  . Sulfa Antibiotics     Difficulty Breathing    VITALS:  Blood pressure 136/87, pulse 136, temperature 97.8 F (36.6 C), temperature source Oral, resp. rate 22, height 5\' 11"  (1.803 m), weight 99.474 kg (219 lb 4.8 oz), SpO2 98 %.  PHYSICAL EXAMINATION:  VITAL SIGNS: Filed Vitals:   07/26/15 0841 07/26/15 1141  BP: 138/93 136/87  Pulse: 136 136  Temp:  97.8 F (36.6 C)  Resp:     GENERAL:70 y.o.male currently in no acute distress.  HEAD: Normocephalic, atraumatic.  EYES: Pupils equal, round, reactive to light. Extraocular muscles intact. No scleral icterus.  MOUTH: Moist mucosal membrane. Dentition intact. No abscess noted.  EAR, NOSE, THROAT: Clear without exudates. No  external lesions.  NECK: Supple. No thyromegaly. No nodules. No JVD.  PULMONARY: now only scant expiratory wheeze No use of accessory muscles, Good respiratory effort. good air entry bilaterally CHEST: Nontender to palpation.  CARDIOVASCULAR: S1 and S2. Irregular rate and irregular rhythm. No murmurs, rubs, or gallops. No edema. Pedal pulses 2+ bilaterally.  GASTROINTESTINAL: Soft, nontender, nondistended. No masses. Positive bowel sounds. No hepatosplenomegaly.  MUSCULOSKELETAL: No swelling, clubbing, or edema. Range of motion full in all extremities.  NEUROLOGIC: Cranial nerves II through XII are intact. No gross focal neurological deficits. Sensation intact. Reflexes intact.  SKIN: No ulceration, lesions, rashes, or cyanosis. Skin warm and dry. Turgor intact.  PSYCHIATRIC: Mood, affect within normal limits. The patient is awake, alert and oriented x 3. Insight, judgment intact.      LABORATORY PANEL:   CBC  Recent Labs Lab 07/26/15 0601  WBC 19.6*  HGB 15.4  HCT 45.2  PLT 200   ------------------------------------------------------------------------------------------------------------------  Chemistries   Recent Labs Lab 07/25/15 1735 07/26/15 0601  NA 133*  --   K 3.7  --   CL 101  --   CO2 20*  --   GLUCOSE 299*  --   BUN 51*  --   CREATININE 1.41* 1.11  CALCIUM 9.0  --   MG 1.9  --    ------------------------------------------------------------------------------------------------------------------  Cardiac Enzymes  Recent Labs Lab 07/23/15 0717  TROPONINI <0.03   ------------------------------------------------------------------------------------------------------------------  RADIOLOGY:  No results found.  EKG:   Orders placed or performed during the hospital encounter of 07/23/15  . EKG 12-Lead  .  EKG 12-Lead  . ED EKG within 10 minutes  . ED EKG within 10 minutes    ASSESSMENT AND PLAN:   70 year old Caucasian gentleman admitted 07/23/15  with COPD exacerbation as well as rapid atrial fibrillation  1.Chronic obstructive pulmonary disease exacerbation: Breathing treatments Solu-Medrol 60 mg IV q. daily if continued improvement decrease steroids tomorrow versus convert to oral, continue Levaquin 4/5. Continue with home medications.  2. Acute on chronic diastolic congestive heart failure: Appreciate cardiology input condition worsened by heart rate is better controlled on amiodarone and metoprolol added today  3. Hypothyroidism unspecified Synthroid 4. Type 2 diabetes non-insulin-requiring: poor controlled on steroids :increase insulin sliding scale given hyperglycemia had Levemir for basal coverage 5. Venous thromboembolism prophylactic: Lovenox       All the records are reviewed and case discussed with Care Management/Social Workerr. Management plans discussed with the patient, family and they are in agreement.  CODE STATUS: Full  TOTAL TIME TAKING CARE OF THIS PATIENT: 33 minutes.   POSSIBLE D/C IN 2-3 DAYS, DEPENDING ON CLINICAL CONDITION.   Hower,  Karenann Cai.D on 07/26/2015 at 1:59 PM  Between 7am to 6pm - Pager - [phone]  After 6pm: House Pager: - [facility]  Office  [phone]  CC: Primary care physician; [name], MD

## 2015-07-26 NOTE — Clinical Documentation Improvement (Signed)
Cardiology Internal Medicine  Can the diagnosis of HFpEF be further specified? Please document findings in next progress note. Thank you!    Acuity - Acute, Chronic, Acute on Chronic   Type - Systolic, Diastolic, Systolic and Diastolic  Other  Clinically Undetermined  Document any associated diagnoses/conditions  Supporting Information:  BNP was 397 - 3/26 Cardiology note  Being treated with PO Lopressor 25 mg twice daily  Please exercise your independent, professional judgment when responding. A specific answer is not anticipated or expected.  Thank You,  Zoila Shutter RN, BSN, [place] [phone]; Cell: [phone]

## 2015-07-26 NOTE — Progress Notes (Signed)
Inpatient Diabetes Program Recommendations  AACE/ADA: New Consensus Statement on Inpatient Glycemic Control (2015)  Target Ranges:  Prepandial:   less than 140 mg/dL      Peak postprandial:   less than 180 mg/dL (1-2 hours)      Critically ill patients:  140 - 180 mg/dL   Review of Glycemic Control:  Results for LUCCAS, STRYCKER SR. (MRN [id number]) as of 07/26/2015 08:28  Ref. Range 07/24/2015 16:54 07/24/2015 20:54 07/25/2015 07:24 07/25/2015 12:25 07/25/2015 16:33 07/25/2015 20:58 07/26/2015 07:37  Glucose-Capillary Latest Ref Range: 65-99 mg/dL 213 (H) 272 (H) 284 (H) 299 (H) 301 (H) 237 (H) 218 (H)    Diabetes history: Type 2 diabetes Outpatient Diabetes medications: Metformin 500 mg bid,  Current orders for Inpatient glycemic control:  Novolog resistant tid with meals and HS, Metformin 1000 mg bid, Solumedrol 60 mg IV q 24 hours  Inpatient Diabetes Program Recommendations:    Please consider adding Levemir 15 units daily while patient is in the hospital.  Also consider checking A1C to determine pre-hospitalization glycemic control.    Thanks, Adah Perl, RN, BC-ADM Inpatient Diabetes Coordinator Pager [phone]

## 2015-07-26 NOTE — Evaluation (Signed)
Physical Therapy Evaluation Patient Details Name: Philip MARANA Sr. MRN: [id number] DOB: 01/16/1946 Today's Date: 07/26/2015   History of Present Illness  Patient is a pleasant 70 y/o male that presents with shortness of breath, coughing, wheezing. Has been tachy at times in this hospitalization, infused with amioderone, now on BID oral.   Clinical Impression  Patient is still working and quite active at baseline. It appears his shortness of breath, coughing has resolved somewhat since the time of his admission. He was able to tolerate prolonged ambulation without use of AD or O2, his sats did decrease from 92% to 90% while ambulating, but no symptoms associated with this decline. Patient appears to have residual cardiopulmonary related deficits, no balance deficits noted in this session. PT will keep on caseload to monitor vitals with ambulation, otherwise no follow up needed.     Follow Up Recommendations No PT follow up    Equipment Recommendations       Recommendations for Other Services       Precautions / Restrictions Precautions Precautions: None Restrictions Weight Bearing Restrictions: No      Mobility  Bed Mobility Overal bed mobility: Independent             General bed mobility comments: No deficits observed.   Transfers Overall transfer level: Independent               General transfer comment: No deficits with standing balance.   Ambulation/Gait Ambulation/Gait assistance: Supervision Ambulation Distance (Feet): 250 Feet Assistive device: None Gait Pattern/deviations: WFL(Within Functional Limits)   Gait velocity interpretation: Below normal speed for age/gender General Gait Details: Mild drifting laterally at times while looking horizontally, otherwise this appears to be his baseline, though perhaps decreased speed.   Stairs            Wheelchair Mobility    Modified Rankin (Stroke Patients Only)       Balance Overall balance  assessment: No apparent balance deficits (not formally assessed)                                           Pertinent Vitals/Pain Pain Assessment: No/denies pain    Home Living Family/patient expects to be discharged to:: Private residence Living Arrangements: Spouse/significant other Available Help at Discharge: Family;Available 24 hours/day Type of Home: House Home Access: Stairs to enter Entrance Stairs-Rails: None Entrance Stairs-Number of Steps: 1 Home Layout: One level Home Equipment: None      Prior Function Level of Independence: Independent         Comments: Patient reports no falls, no AD use at home.      Hand Dominance   Dominant Hand: Right    Extremity/Trunk Assessment   Upper Extremity Assessment: Overall WFL for tasks assessed           Lower Extremity Assessment: Overall WFL for tasks assessed         Communication   Communication: No difficulties  Cognition Arousal/Alertness: Awake/alert Behavior During Therapy: WFL for tasks assessed/performed Overall Cognitive Status: Within Functional Limits for tasks assessed                      General Comments      Exercises        Assessment/Plan    PT Assessment Patient needs continued PT services  PT Diagnosis Difficulty walking   PT  Problem List Cardiopulmonary status limiting activity  PT Treatment Interventions Therapeutic exercise;Therapeutic activities;Stair training;Patient/family education   PT Goals (Current goals can be found in the Care Plan section) Acute Rehab PT Goals Patient Stated Goal: To return home  PT Goal Formulation: With patient/family Time For Goal Achievement: 08/09/15 Potential to Achieve Goals: Good    Frequency Min 2X/week   Barriers to discharge        Co-evaluation               End of Session Equipment Utilized During Treatment: Gait belt Activity Tolerance: Patient tolerated treatment well Patient left: in  bed;with family/visitor present;with call bell/phone within reach Nurse Communication: Mobility status         Time: 1441-1453 PT Time Calculation (min) (ACUTE ONLY): 12 min   Charges:   PT Evaluation $PT Eval Moderate Complexity: 1 Procedure     PT G Codes:       Kerman Passey, PT, DPT     07/26/2015, 5:15 PM

## 2015-07-26 NOTE — Care Management Note (Signed)
Case Management Note  Patient Details  Name: Philip SARA Sr. MRN: [id number] Date of Birth: [birth year]  Subjective/Objective:   COPD and CHF  Exacerbation. Met with patient and his wife at bedside. He lives at home with his wife. He is independent, active, drives and requires no DME or home O2. Patient has been up walking to the bathroom. NO needs identified. Denies issues obtaining medications, copays or medical care.                 Action/Plan: Case Closed.   Expected Discharge Date:                  Expected Discharge Plan:  Home/Self Care  In-House Referral:     Discharge planning Services  CM Consult  Post Acute Care Choice:    Choice offered to:     DME Arranged:    DME Agency:     HH Arranged:    [place] Agency:     Status of Service:  Completed, signed off  Medicare Important Message Given:  Yes Date Medicare IM Given:    Medicare IM give by:    Date Additional Medicare IM Given:    Additional Medicare Important Message give by:     If discussed at [place] of Stay Meetings, dates discussed:    Additional Comments:  Jolly Mango, RN 07/26/2015, 2:02 PM

## 2015-07-26 NOTE — Progress Notes (Signed)
Patient rested quietly tonight with no complaints of pain. Requested a breathing treatment at 0500 this morning. A&Ox4, VSS, and A-fib/NSR on tele. Wife at bedside. Nursing staff will continue to monitor. Earleen Reaper, RN

## 2015-07-26 NOTE — Care Management Important Message (Signed)
Important Message  Patient Details  Name: Philip PROVENCIO Sr. MRN: [id number] Date of Birth: [birth year]   Medicare Important Message Given:  Yes    Katrina Stack, RN 07/26/2015, 9:03 AM

## 2015-07-26 NOTE — Progress Notes (Signed)
SATURATION QUALIFICATIONS: (This note is used to comply with regulatory documentation for home oxygen)  Patient Saturations on Room Air at Rest = 95%  Patient Saturations on Room Air while Ambulating = 90%  Patient Saturations on  Liters of oxygen while Ambulating = N/A  Please briefly explain why patient needs home oxygen: Patient has required 2 liters of oxygen while admitted, currently taken off oxygen.

## 2015-07-26 NOTE — Progress Notes (Signed)
Patient has been alert and oriented. Vitals stable. Patient tolerating Q6 metoprolol well and states this afternoon he feels better than he has since he's been here. Patient has been able to cough up some sputum and stated that has helped him too. First thing this morning heart rate was running up to the 140's. After giving medications this AM, heart rate has stayed under 90 almost the whole day. No complaints of pain. Will continue to monitor.

## 2015-07-26 NOTE — Progress Notes (Addendum)
Patient: Philip DRANE Sr. / Admit Date: 07/23/2015 / Date of Encounter: 07/26/2015, 9:54 AM   Subjective: Continued SOB and wheezing. Had severe SOB overnight s/p ambulating to use the restroom. Improved with nebulizer. No tachy-palpitations associated. WBC 19,000 in the setting of steroids likely. SCr 1.41 up from 0.99.   Review of Systems: Review of Systems  Constitutional: Positive for malaise/fatigue. Negative for fever, chills, weight loss and diaphoresis.  HENT: Negative for congestion.   Eyes: Negative for discharge and redness.  Respiratory: Positive for cough, shortness of breath and wheezing. Negative for hemoptysis and sputum production.   Cardiovascular: Positive for palpitations. Negative for chest pain, orthopnea, claudication, leg swelling and PND.  Gastrointestinal: Negative for nausea, vomiting and abdominal pain.  Musculoskeletal: Negative for falls.  Skin: Negative for rash.  Neurological: Positive for weakness. Negative for dizziness, tingling, tremors, sensory change and speech change.  Endo/Heme/Allergies: Does not bruise/bleed easily.  Psychiatric/Behavioral: The patient is not nervous/anxious.      Objective: Telemetry: atrial tachycardia with rates into the 130's this morning, currently in sinus rhythm, 70's Physical Exam: Blood pressure 138/93, pulse 136, temperature 97.7 F (36.5 C), temperature source Oral, resp. rate 22, height 5\' 11"  (1.803 m), weight 219 lb 4.8 oz (99.474 kg), SpO2 98 %. Body mass index is 30.6 kg/(m^2). General: Well developed, well nourished, in no acute distress. Head: Normocephalic, atraumatic, sclera non-icteric, no xanthomas, nares are without discharge. Neck: Negative for carotid bruits. JVP not elevated. Lungs: Wheezing bilaterally, worse at the bases. Breathing is unlabored on nasal cannula. Heart: RRR S1 S2 without murmurs, rubs, or gallops.  Abdomen: Soft, non-tender, non-distended with normoactive bowel sounds. No  rebound/guarding. Extremities: No clubbing or cyanosis. No edema. Distal pedal pulses are 2+ and equal bilaterally. Neuro: Alert and oriented X 3. Moves all extremities spontaneously. Psych:  Responds to questions appropriately with a normal affect.   Intake/Output Summary (Last 24 hours) at 07/26/15 0954 Last data filed at 07/26/15 0844  Gross per 24 hour  Intake    583 ml  Output      0 ml  Net    583 ml    Inpatient Medications:  . amiodarone  400 mg Oral BID  . atorvastatin  20 mg Oral QHS  . enoxaparin (LOVENOX) injection  40 mg Subcutaneous Q24H  . fluticasone  1 spray Each Nare Daily  . insulin aspart  0-20 Units Subcutaneous TID WC  . insulin aspart  0-5 Units Subcutaneous QHS  . levofloxacin (LEVAQUIN) IV  500 mg Intravenous Q24H  . levothyroxine  75 mcg Oral QAC breakfast  . losartan  50 mg Oral QHS  . metFORMIN  1,000 mg Oral BID WC  . methylPREDNISolone (SOLU-MEDROL) injection  60 mg Intravenous Q24H  . metoprolol  25 mg Oral BID  . montelukast  10 mg Oral QHS  . sodium chloride flush  3 mL Intravenous Q12H  . tiotropium  18 mcg Inhalation Daily   Infusions:    Labs:  Recent Labs  07/24/15 0508 07/25/15 1735 07/26/15 0601  NA 139 133*  --   K 4.7 3.7  --   CL 109 101  --   CO2 26 20*  --   GLUCOSE 289* 299*  --   BUN 24* 51*  --   CREATININE 0.99 1.41* 1.11  CALCIUM 8.9 9.0  --   MG  --  1.9  --    No results for input(s): AST, ALT, ALKPHOS, BILITOT, PROT, ALBUMIN in  the last 72 hours.  Recent Labs  07/26/15 0601  WBC 19.6*  HGB 15.4  HCT 45.2  MCV 91.2  PLT 200   No results for input(s): CKTOTAL, CKMB, TROPONINI in the last 72 hours. Invalid input(s): POCBNP No results for input(s): HGBA1C in the last 72 hours.   Weights: Filed Weights   07/23/15 0656 07/23/15 2202  Weight: 225 lb (102.059 kg) 219 lb 4.8 oz (99.474 kg)     Radiology/Studies:  Nm Pulmonary Perf And Vent  07/23/2015  CLINICAL DATA:  Dyspnea and iodine allergy.   COPD. EXAM: NUCLEAR MEDICINE VENTILATION - PERFUSION LUNG SCAN TECHNIQUE: Ventilation images were obtained in multiple projections using inhaled aerosol Tc-61m DTPA. Perfusion images were obtained in multiple projections after intravenous injection of Tc-91m MAA. RADIOPHARMACEUTICALS:  31.474 mCi Technetium-74m DTPA aerosol inhalation and 4.024 mCi Technetium-17m MAA IV COMPARISON:  Chest radiograph 07/23/2015 FINDINGS: Ventilation: Diffusely heterogeneous tracer activity throughout both lungs with pooling of activity in the tracheobronchial tree. Appearance is consistent with emphysema and air trapping. Perfusion: Mildly heterogeneous distribution tracer activity throughout both lungs in a nonsegmental pattern likely related to emphysema and air trapping. No focal segmental perfusion defects identified. IMPRESSION: Low probability of pulmonary embolus.  Emphysematous changes. Electronically Signed   By: Lucienne Capers M.D.   On: 07/23/2015 20:58   Dg Chest Portable 1 View  07/23/2015  CLINICAL DATA:  Shortness of Breath EXAM: PORTABLE CHEST 1 VIEW COMPARISON:  03/09/2015 FINDINGS: The heart size and mediastinal contours are within normal limits. Both lungs are clear. The visualized skeletal structures are unremarkable. IMPRESSION: No active disease. Electronically Signed   By: Inez Catalina M.D.   On: 07/23/2015 07:26     Assessment and Plan   1. Atrial tachycardia: -Status post amiodarone infusion, now on amiodarone 400 mg bid -Improved with scheduled Lopressor and amiodarone this morning, given early  -Change metoprolol to q 6 hours for added rate control now that hypotension has improved  -Could also use diltiazem 30 mg q 6 hours given his SOB and COPD exacerbation  -Hold parameters placed  2. Acute on chronic diastolic CHF (HFpEF): -Lopressor as above -Echo showed normal LV systolic function, [id number], mild MR, PASP normal  3. COPD exacerbation: -Steroids, ABX, and inhalers per IM -Incentive  spirometer   4. CAD s/p CABG 2014: -Will need outpatient Lexiscan  5. Hypotension: -Resolved  6. HLD: -Lipitor 20 mg  7. Leukocytosis: -Likely 2/2 steroids    Signed, Christell Faith, [name] Pager: [phone] 07/26/2015, 9:54 AM    Attending Note Patient seen and examined, agree with detailed note above,  Patient presentation and plan discussed on rounds.   Patient reports having shortness of breath last night, acute, severe We'll From sleep, wife reports significant snoring, possible sleep apnea Never had a sleep study in the past Episodes of tachycardia throughout the day Still with wheezing, proceeding nebulizer treatments  Clinical exam as above, mid to lower lung fields bilaterally with decreased air movement Heart rate regular on this evening exam, soft abdomen, nontender, no significant leg edema  Lab work reviewed showing improved renal function Tolerating metoprolol and amiodarone Telemetry reviewed showing frequent bursts of atrial tachycardia  --- Arrhythmia Atrial tachycardia, long-standing history, for the most part is asymptomatic Would continue amiodarone and metoprolol as above, Tomorrow we will change to metoprolol 50 mg twice a day, continue amiodarone   --Acute diastolic CHF Secondary to tachycardia and arrhythmia We'll need periodic Lasix  --Bronchospasm/bronchitis/COPD exacerbation Agree with Xopenex nebulizer treatments  Long discussion concerning when he can go home, management of his arrhythmia, various treatment options discussed with patient and his wife. All questions answered   Total encounter time more than 35 minutes  Greater than 50% was spent in counseling and coordination of care with the patient   Signed: Esmond Plants  M.D., Ph.D. [facility]

## 2015-07-27 DIAGNOSIS — R0600 Dyspnea, unspecified: Secondary | ICD-10-CM

## 2015-07-27 LAB — GLUCOSE, CAPILLARY
GLUCOSE-CAPILLARY: 163 mg/dL — AB (ref 65–99)
GLUCOSE-CAPILLARY: 114 mg/dL — AB (ref 65–99)
GLUCOSE-CAPILLARY: 299 mg/dL — AB (ref 65–99)
Glucose-Capillary: 261 mg/dL — ABNORMAL HIGH (ref 65–99)

## 2015-07-27 MED ORDER — FUROSEMIDE 10 MG/ML IJ SOLN
40.0000 mg | Freq: Once | INTRAMUSCULAR | Status: AC
  Administered 2015-07-27: 40 mg via INTRAVENOUS

## 2015-07-27 MED ORDER — LEVOFLOXACIN 500 MG PO TABS
500.0000 mg | ORAL_TABLET | Freq: Every day | ORAL | Status: DC
  Administered 2015-07-28 – 2015-07-29 (×2): 500 mg via ORAL
  Filled 2015-07-27 (×2): qty 1

## 2015-07-27 MED ORDER — POTASSIUM CHLORIDE CRYS ER 20 MEQ PO TBCR
40.0000 meq | EXTENDED_RELEASE_TABLET | Freq: Once | ORAL | Status: AC
  Administered 2015-07-27: 40 meq via ORAL
  Filled 2015-07-27: qty 2

## 2015-07-27 MED ORDER — METOPROLOL TARTRATE 50 MG PO TABS
50.0000 mg | ORAL_TABLET | Freq: Two times a day (BID) | ORAL | Status: DC
  Administered 2015-07-27: 50 mg via ORAL
  Filled 2015-07-27 (×2): qty 1

## 2015-07-27 NOTE — Progress Notes (Signed)
Patient: Philip VIRGIL Sr. / Admit Date: 07/23/2015 / Date of Encounter: 07/27/2015, 9:09 AM   Subjective:  severe SOB overnight  Given lasix 40 mg IV x 1, improving sx, Wife reports that faces less swollen Concern for obstructive sleep apnea, wife reports periods of apnea with severe snoring He seems to have shortness of breath episodes at 2 or 3 in the morning  Review of Systems: Review of Systems  Constitutional: Negative.  Negative for fever, chills, weight loss and diaphoresis.  HENT: Negative for congestion.   Eyes: Negative for discharge and redness.  Respiratory: Positive for shortness of breath. Negative for hemoptysis and sputum production.   Cardiovascular: Positive for palpitations. Negative for chest pain, orthopnea, claudication, leg swelling and PND.  Gastrointestinal: Negative for nausea, vomiting and abdominal pain.  Musculoskeletal: Negative.  Negative for falls.  Skin: Negative for rash.  Neurological: Negative for dizziness, tingling, tremors, sensory change and speech change.  Endo/Heme/Allergies: Does not bruise/bleed easily.  Psychiatric/Behavioral: The patient is not nervous/anxious.      Objective: Telemetry: atrial tachycardia, currently in sinus rhythm Physical Exam: Blood pressure 126/94, pulse 108, temperature 98.4 F (36.9 C), temperature source Oral, resp. rate 16, height 5\' 11"  (1.803 m), weight 219 lb 4.8 oz (99.474 kg), SpO2 93 %. Body mass index is 30.6 kg/(m^2). General: Well developed, well nourished, in no acute distress. Head: Normocephalic, atraumatic, sclera non-icteric, no xanthomas, nares are without discharge. Neck: Negative for carotid bruits. JVP not elevated. Lungs: Wheezing bilaterally, worse at the bases. Breathing is unlabored  Heart: RRR S1 S2 without murmurs, rubs, or gallops.  Abdomen: Soft, non-tender, non-distended with normoactive bowel sounds. No rebound/guarding. Extremities: No clubbing or cyanosis. No edema. Distal  pedal pulses are 2+ and equal bilaterally. Neuro: Alert and oriented X 3. Moves all extremities spontaneously. Psych:  Responds to questions appropriately with a normal affect.   Intake/Output Summary (Last 24 hours) at 07/27/15 0909 Last data filed at 07/26/15 1900  Gross per 24 hour  Intake    240 ml  Output      0 ml  Net    240 ml    Inpatient Medications:  . amiodarone  400 mg Oral BID  . atorvastatin  20 mg Oral QHS  . enoxaparin (LOVENOX) injection  40 mg Subcutaneous Q24H  . fluticasone  1 spray Each Nare Daily  . furosemide  40 mg Intravenous Once  . insulin aspart  0-20 Units Subcutaneous TID WC  . insulin aspart  0-5 Units Subcutaneous QHS  . insulin detemir  15 Units Subcutaneous QHS  . levofloxacin (LEVAQUIN) IV  500 mg Intravenous Q24H  . levothyroxine  75 mcg Oral QAC breakfast  . losartan  50 mg Oral QHS  . metFORMIN  1,000 mg Oral BID WC  . methylPREDNISolone (SOLU-MEDROL) injection  60 mg Intravenous Q24H  . metoprolol  25 mg Oral Q6H  . montelukast  10 mg Oral QHS  . potassium chloride  40 mEq Oral Once  . sodium chloride flush  3 mL Intravenous Q12H  . tiotropium  18 mcg Inhalation Daily   Infusions:    Labs:  Recent Labs  07/25/15 1735 07/26/15 0601  NA 133*  --   K 3.7  --   CL 101  --   CO2 20*  --   GLUCOSE 299*  --   BUN 51*  --   CREATININE 1.41* 1.11  CALCIUM 9.0  --   MG 1.9  --  No results for input(s): AST, ALT, ALKPHOS, BILITOT, PROT, ALBUMIN in the last 72 hours.  Recent Labs  07/26/15 0601  WBC 19.6*  HGB 15.4  HCT 45.2  MCV 91.2  PLT 200   No results for input(s): CKTOTAL, CKMB, TROPONINI in the last 72 hours. Invalid input(s): POCBNP No results for input(s): HGBA1C in the last 72 hours.   Weights: Filed Weights   07/23/15 0656 07/23/15 2202  Weight: 225 lb (102.059 kg) 219 lb 4.8 oz (99.474 kg)     Radiology/Studies:  Nm Pulmonary Perf And Vent  07/23/2015  CLINICAL DATA:  Dyspnea and iodine allergy.   COPD. EXAM: NUCLEAR MEDICINE VENTILATION - PERFUSION LUNG SCAN TECHNIQUE: Ventilation images were obtained in multiple projections using inhaled aerosol Tc-9m DTPA. Perfusion images were obtained in multiple projections after intravenous injection of Tc-52m MAA. RADIOPHARMACEUTICALS:  31.474 mCi Technetium-28m DTPA aerosol inhalation and 4.024 mCi Technetium-15m MAA IV COMPARISON:  Chest radiograph 07/23/2015 FINDINGS: Ventilation: Diffusely heterogeneous tracer activity throughout both lungs with pooling of activity in the tracheobronchial tree. Appearance is consistent with emphysema and air trapping. Perfusion: Mildly heterogeneous distribution tracer activity throughout both lungs in a nonsegmental pattern likely related to emphysema and air trapping. No focal segmental perfusion defects identified. IMPRESSION: Low probability of pulmonary embolus.  Emphysematous changes. Electronically Signed   By: Lucienne Capers M.D.   On: 07/23/2015 20:58   Dg Chest Portable 1 View  07/23/2015  CLINICAL DATA:  Shortness of Breath EXAM: PORTABLE CHEST 1 VIEW COMPARISON:  03/09/2015 FINDINGS: The heart size and mediastinal contours are within normal limits. Both lungs are clear. The visualized skeletal structures are unremarkable. IMPRESSION: No active disease. Electronically Signed   By: Inez Catalina M.D.   On: 07/23/2015 07:26     Assessment and Plan   1. Atrial tachycardia: on Lopressor and amiodarone  Could change to metoprolol 50 mg po BID  2. Acute on chronic diastolic CHF (HFpEF): -Echo showed normal LV systolic function,  Lasix IV given at 3 AM for shortness of breath symptoms with improvement Suspect he has fluid retention secondary to underlying arrhythmia, COPD --- We will give additional Lasix IV bolus this morning 1 with potassium He will likely need Lasix to take as needed at home  3. COPD exacerbation: -Steroids, ABX, and inhalers per IM  4. CAD s/p CABG 2014:  outpatient Lexiscan, only  after copd exacerbation has resolved  5. HLD: -Lipitor 20 mg  7. Leukocytosis: -Likely 2/2 steroids   Signed: Esmond Plants  M.D., Ph.D. [facility]

## 2015-07-27 NOTE — Progress Notes (Signed)
[place] at [place] NAME: Philip [name]    MR#:  [id number]  DATE OF BIRTH:  [birth year]  SUBJECTIVE:  Another episode of shortness of breath overnight relief with Lasix Likely Sleep apnea versus paroxysmal nocturnal dyspnea Patient sitting comfortably in chair states feels "so-so" wife at bedside, now off of oxygen  REVIEW OF SYSTEMS:  CONSTITUTIONAL: No fever, positive fatigue or weakness.  EYES: No blurred or double vision.  EARS, NOSE, AND THROAT: No tinnitus or ear pain.  RESPIRATORY: Positive cough, shortness of breath, wheezing denies hemoptysis.  CARDIOVASCULAR: No chest pain, orthopnea, edema.  GASTROINTESTINAL: No nausea, vomiting, diarrhea or abdominal pain.  GENITOURINARY: No dysuria, hematuria.  ENDOCRINE: No polyuria, nocturia,  HEMATOLOGY: No anemia, easy bruising or bleeding SKIN: No rash or lesion. MUSCULOSKELETAL: No joint pain or arthritis.   NEUROLOGIC: No tingling, numbness, weakness.  PSYCHIATRY: No anxiety or depression.   DRUG ALLERGIES:   Allergies  Allergen Reactions  . Iodine Rash  . Penicillins Hives, Itching, Rash and Other (See Comments)    "broke out in welps" (05/06/2012)  . Sulfa Antibiotics Shortness Of Breath  . Iodine     Rash and itching  . Ivp Dye [Iodinated Diagnostic Agents]     Rash;redness  . Penicillins     Hives  . Sulfa Antibiotics     Difficulty Breathing    VITALS:  Blood pressure 134/89, pulse 43, temperature 98.4 F (36.9 C), temperature source Oral, resp. rate 14, height 5\' 11"  (1.803 m), weight 99.474 kg (219 lb 4.8 oz), SpO2 96 %.  PHYSICAL EXAMINATION:  VITAL SIGNS: Filed Vitals:   07/27/15 0800 07/27/15 1118  BP: 126/94 134/89  Pulse: 108 43  Temp: 98.4 F (36.9 C)   Resp: 16 14   GENERAL:70 y.o.male currently in no acute distress.  HEAD: Normocephalic, atraumatic.  EYES: Pupils equal, round, reactive to light. Extraocular muscles intact. No scleral  icterus.  MOUTH: Moist mucosal membrane. Dentition intact. No abscess noted.  EAR, NOSE, THROAT: Clear without exudates. No external lesions.  NECK: Supple. No thyromegaly. No nodules. No JVD.  PULMONARY: Minimal coarse rhonchi left base otherwise no wheeze rails No use of accessory muscles, Good respiratory effort. good air entry bilaterally CHEST: Nontender to palpation.  CARDIOVASCULAR: S1 and S2. Irregular rate and irregular rhythm. No murmurs, rubs, or gallops. No edema. Pedal pulses 2+ bilaterally.  GASTROINTESTINAL: Soft, nontender, nondistended. No masses. Positive bowel sounds. No hepatosplenomegaly.  MUSCULOSKELETAL: No swelling, clubbing, or edema. Range of motion full in all extremities.  NEUROLOGIC: Cranial nerves II through XII are intact. No gross focal neurological deficits. Sensation intact. Reflexes intact.  SKIN: No ulceration, lesions, rashes, or cyanosis. Skin warm and dry. Turgor intact.  PSYCHIATRIC: Mood, affect within normal limits. The patient is awake, alert and oriented x 3. Insight, judgment intact.      LABORATORY PANEL:   CBC  Recent Labs Lab 07/26/15 0601  WBC 19.6*  HGB 15.4  HCT 45.2  PLT 200   ------------------------------------------------------------------------------------------------------------------  Chemistries   Recent Labs Lab 07/25/15 1735 07/26/15 0601  NA 133*  --   K 3.7  --   CL 101  --   CO2 20*  --   GLUCOSE 299*  --   BUN 51*  --   CREATININE 1.41* 1.11  CALCIUM 9.0  --   MG 1.9  --    ------------------------------------------------------------------------------------------------------------------  Cardiac Enzymes  Recent Labs Lab 07/23/15 0717  TROPONINI <0.03   ------------------------------------------------------------------------------------------------------------------  RADIOLOGY:  No results found.  EKG:   Orders placed or performed during the hospital encounter of 07/23/15  . EKG 12-Lead  .  EKG 12-Lead  . ED EKG within 10 minutes  . ED EKG within 10 minutes    ASSESSMENT AND PLAN:   70 year old Caucasian gentleman admitted 07/23/15 with COPD exacerbation as well as rapid atrial fibrillation  1.Chronic obstructive pulmonary disease exacerbation: Breathing treatments Convert prednisone, continue Levaquin 5/5. Continue with home medications.  2. Acute on chronic diastolic congestive heart failure: Appreciate cardiology input condition worsened by heart rate is better controlled on amiodarone and metoprolol 3. Hypothyroidism unspecified Synthroid 4. Type 2 diabetes non-insulin-requiring: poor controlled on steroids :increase insulin sliding scale given hyperglycemia had Levemir for basal coverage 5. Venous thromboembolism prophylactic: Lovenox       All the records are reviewed and case discussed with Care Management/Social Workerr. Management plans discussed with the patient, family and they are in agreement.  CODE STATUS: Full  TOTAL TIME TAKING CARE OF THIS PATIENT:28 minutes.   POSSIBLE D/C IN 1 DAYS, DEPENDING ON CLINICAL CONDITION.   Philip [name],  Philip [name].D on 07/27/2015 at 2:18 PM  Between 7am to 6pm - Pager - [phone]  After 6pm: House Pager: - [place]  Office  [phone]  CC: Primary care physician; Philip Doe, MD

## 2015-07-27 NOTE — Progress Notes (Signed)
Pt complaining of not being able to breath. Upon assessment pt seemed SOB but not in distress. O2 saturations 92%.  RN administered PRN order of lasix 40 for SOB. Will continue to monitor.   Philip [name]

## 2015-07-28 DIAGNOSIS — J432 Centrilobular emphysema: Secondary | ICD-10-CM

## 2015-07-28 LAB — CULTURE, BLOOD (ROUTINE X 2)
CULTURE: NO GROWTH
CULTURE: NO GROWTH

## 2015-07-28 LAB — CBC
HEMATOCRIT: 48.9 % (ref 40.0–52.0)
Hemoglobin: 16.7 g/dL (ref 13.0–18.0)
MCH: 30.9 pg (ref 26.0–34.0)
MCHC: 34.1 g/dL (ref 32.0–36.0)
MCV: 90.5 fL (ref 80.0–100.0)
PLATELETS: 225 10*3/uL (ref 150–440)
RBC: 5.41 MIL/uL (ref 4.40–5.90)
RDW: 13.8 % (ref 11.5–14.5)
WBC: 16.2 10*3/uL — AB (ref 3.8–10.6)

## 2015-07-28 LAB — BASIC METABOLIC PANEL
Anion gap: 5 (ref 5–15)
BUN: 52 mg/dL — AB (ref 6–20)
CALCIUM: 9.3 mg/dL (ref 8.9–10.3)
CHLORIDE: 99 mmol/L — AB (ref 101–111)
CO2: 31 mmol/L (ref 22–32)
CREATININE: 1.46 mg/dL — AB (ref 0.61–1.24)
GFR calc non Af Amer: 48 mL/min — ABNORMAL LOW (ref >60)
GFR, EST AFRICAN AMERICAN: 55 mL/min — AB (ref >60)
GLUCOSE: 243 mg/dL — AB (ref 65–99)
Potassium: 4.9 mmol/L (ref 3.5–5.1)
Sodium: 135 mmol/L (ref 135–145)

## 2015-07-28 LAB — GLUCOSE, CAPILLARY
GLUCOSE-CAPILLARY: 143 mg/dL — AB (ref 65–99)
GLUCOSE-CAPILLARY: 177 mg/dL — AB (ref 65–99)
GLUCOSE-CAPILLARY: 239 mg/dL — AB (ref 65–99)
Glucose-Capillary: 282 mg/dL — ABNORMAL HIGH (ref 65–99)

## 2015-07-28 LAB — MAGNESIUM: Magnesium: 2.3 mg/dL (ref 1.7–2.4)

## 2015-07-28 MED ORDER — METOPROLOL TARTRATE 50 MG PO TABS
75.0000 mg | ORAL_TABLET | Freq: Two times a day (BID) | ORAL | Status: DC
  Administered 2015-07-28 – 2015-07-29 (×2): 75 mg via ORAL
  Filled 2015-07-28 (×2): qty 1

## 2015-07-28 MED ORDER — AMIODARONE HCL 200 MG PO TABS
200.0000 mg | ORAL_TABLET | Freq: Two times a day (BID) | ORAL | Status: DC
  Administered 2015-07-28 – 2015-07-29 (×2): 200 mg via ORAL
  Filled 2015-07-28 (×2): qty 1

## 2015-07-28 MED ORDER — PREDNISONE 20 MG PO TABS
40.0000 mg | ORAL_TABLET | Freq: Every day | ORAL | Status: DC
  Administered 2015-07-29: 40 mg via ORAL
  Filled 2015-07-28: qty 2

## 2015-07-28 MED ORDER — POLYVINYL ALCOHOL 1.4 % OP SOLN
1.0000 [drp] | OPHTHALMIC | Status: DC | PRN
Start: 2015-07-28 — End: 2015-07-29
  Administered 2015-07-28 – 2015-07-29 (×2): 1 [drp] via OPHTHALMIC
  Filled 2015-07-28: qty 15

## 2015-07-28 NOTE — Plan of Care (Signed)
Problem: Cardiac: Goal: Ability to achieve and maintain adequate cardiopulmonary perfusion will improve Outcome: Not Progressing Patient continues to have some arrhythmia and pulses. Patient is asymptomatic.

## 2015-07-28 NOTE — Progress Notes (Signed)
Tele monitor notified nurse that patient HR drops to 30's and 40's, but increased back to 100 within 1-2 seconds. Christell Faith, PA on floor and notified of change. Lab work ordered at this time. Patient asymptomatic except for c/o SOB, saturations stable. Christell Faith, PA also notified of this. Wilnette Kales

## 2015-07-28 NOTE — Progress Notes (Signed)
Patient: Philip [name]. / Admit Date: 07/23/2015 / Date of Encounter: 07/28/2015, 10:59 AM   Subjective: SOB slowly continuing to improve both at rest and with ambulation.  Has been dealing with nausea for the past 2 days leading to poor appetite.  SOB improved with IV Lasix on 3/28 and 3/27      Review of Systems: Review of Systems  Constitutional: Positive for weight loss and malaise/fatigue. Negative for fever, chills and diaphoresis.  HENT: Negative for congestion.   Eyes: Negative for discharge and redness.  Respiratory: Positive for shortness of breath. Negative for cough, hemoptysis, sputum production and wheezing.   Cardiovascular: Positive for palpitations. Negative for chest pain, orthopnea, claudication, leg swelling and PND.  Gastrointestinal: Positive for heartburn and nausea. Negative for vomiting and abdominal pain.  Musculoskeletal: Negative for myalgias and falls.  Skin: Negative for rash.  Neurological: Positive for weakness. Negative for dizziness, sensory change, speech change, focal weakness and loss of consciousness.  Endo/Heme/Allergies: Does not bruise/bleed easily.  Psychiatric/Behavioral: The patient is not nervous/anxious.     Objective: Telemetry: atrial tachycardia, currently in NSR, episodes of sinus bradycardia  Physical Exam: Blood pressure 103/89, pulse 97, temperature 98.1 F (36.7 C), temperature source Oral, resp. rate 18, height 5\' 11"  (1.803 m), weight 219 lb 4.8 oz (99.474 kg), SpO2 93 %. Body mass index is 30.6 kg/(m^2). General: Well developed, well nourished, in no acute distress. Head: Normocephalic, atraumatic, sclera non-icteric, no xanthomas, nares are without discharge. Neck: Negative for carotid bruits. JVP not elevated. Lungs: Clear b/l Breathing is unlabored. Heart:tachy,  RRR S1 S2 without murmurs, rubs, or gallops.  Abdomen: Soft, non-tender, non-distended with normoactive bowel sounds. No rebound/guarding. Extremities: No  clubbing or cyanosis. No edema. Distal pedal pulses are 2+ and equal bilaterally. Neuro: Alert and oriented X 3. Moves all extremities spontaneously. Psych:  Responds to questions appropriately with a normal affect.   Intake/Output Summary (Last 24 hours) at 07/28/15 1059 Last data filed at 07/28/15 0830  Gross per 24 hour  Intake    240 ml  Output    400 ml  Net   -160 ml    Inpatient Medications:  . amiodarone  400 mg Oral BID  . atorvastatin  20 mg Oral QHS  . enoxaparin (LOVENOX) injection  40 mg Subcutaneous Q24H  . fluticasone  1 spray Each Nare Daily  . insulin aspart  0-20 Units Subcutaneous TID WC  . insulin aspart  0-5 Units Subcutaneous QHS  . insulin detemir  15 Units Subcutaneous QHS  . levofloxacin  500 mg Oral Daily  . levothyroxine  75 mcg Oral QAC breakfast  . losartan  50 mg Oral QHS  . metFORMIN  1,000 mg Oral BID WC  . methylPREDNISolone (SOLU-MEDROL) injection  60 mg Intravenous Q24H  . metoprolol  50 mg Oral BID  . montelukast  10 mg Oral QHS  . sodium chloride flush  3 mL Intravenous Q12H  . tiotropium  18 mcg Inhalation Daily   Infusions:    Labs:  Recent Labs  07/25/15 1735 07/26/15 0601  NA 133*  --   K 3.7  --   CL 101  --   CO2 20*  --   GLUCOSE 299*  --   BUN 51*  --   CREATININE 1.41* 1.11  CALCIUM 9.0  --   MG 1.9  --    No results for input(s): AST, ALT, ALKPHOS, BILITOT, PROT, ALBUMIN in the last 72 hours.  Recent Labs  07/26/15 0601  WBC 19.6*  HGB 15.4  HCT 45.2  MCV 91.2  PLT 200   No results for input(s): CKTOTAL, CKMB, TROPONINI in the last 72 hours. Invalid input(s): POCBNP No results for input(s): HGBA1C in the last 72 hours.   Weights: Filed Weights   07/23/15 0656 07/23/15 2202  Weight: 225 lb (102.059 kg) 219 lb 4.8 oz (99.474 kg)     Radiology/Studies:  Nm Pulmonary Perf And Vent  07/23/2015  CLINICAL DATA:  Dyspnea and iodine allergy.  COPD. EXAM: NUCLEAR MEDICINE VENTILATION - PERFUSION LUNG SCAN  TECHNIQUE: Ventilation images were obtained in multiple projections using inhaled aerosol Tc-53m DTPA. Perfusion images were obtained in multiple projections after intravenous injection of Tc-39m MAA. RADIOPHARMACEUTICALS:  31.474 mCi Technetium-40m DTPA aerosol inhalation and 4.024 mCi Technetium-76m MAA IV COMPARISON:  Chest radiograph 07/23/2015 FINDINGS: Ventilation: Diffusely heterogeneous tracer activity throughout both lungs with pooling of activity in the tracheobronchial tree. Appearance is consistent with emphysema and air trapping. Perfusion: Mildly heterogeneous distribution tracer activity throughout both lungs in a nonsegmental pattern likely related to emphysema and air trapping. No focal segmental perfusion defects identified. IMPRESSION: Low probability of pulmonary embolus.  Emphysematous changes. Electronically Signed   By: Lucienne Capers M.D.   On: 07/23/2015 20:58   Dg Chest Portable 1 View  07/23/2015  CLINICAL DATA:  Shortness of Breath EXAM: PORTABLE CHEST 1 VIEW COMPARISON:  03/09/2015 FINDINGS: The heart size and mediastinal contours are within normal limits. Both lungs are clear. The visualized skeletal structures are unremarkable. IMPRESSION: No active disease. Electronically Signed   By: Inez Catalina M.D.   On: 07/23/2015 07:26     Assessment and Plan   1. Atrial tachycardia: on Lopressor and amiodarone  Will increase metoprolol 75 mg BID Decrease amio down to 200 BID for nausea  2. Acute on chronic diastolic CHF (HFpEF): -Echo showed normal LV systolic function,  Will check am BMP, If renal function stable, would give lasix this AM  3. COPD exacerbation: -Steroids, ABX, and inhalers per IM  4. CAD s/p CABG 2014: outpatient Lexiscan, only after copd exacerbation has resolved  5. HLD: -Lipitor 20 mg  7. Leukocytosis: -Likely 2/2 steroids   8. HTN: Would hold losartan to allow up titration of metoprolol, BP borderline low  Signed: Esmond Plants M.D.,  Ph.D. [facility]

## 2015-07-28 NOTE — Progress Notes (Signed)
[place] at [place] NAME: Philip [name]    MR#:  [id number]  DATE OF BIRTH:  March 23, 1946  SUBJECTIVE:  Issues with nausea overnight, currently asymptomatic   REVIEW OF SYSTEMS:  CONSTITUTIONAL: No fever, positive fatigue or weakness.  EYES: No blurred or double vision.  EARS, NOSE, AND THROAT: No tinnitus or ear pain.  RESPIRATORY: Positive cough, shortness of breath, wheezing denies hemoptysis.  CARDIOVASCULAR: No chest pain, orthopnea, edema.  GASTROINTESTINAL: No nausea, vomiting, diarrhea or abdominal pain.  GENITOURINARY: No dysuria, hematuria.  ENDOCRINE: No polyuria, nocturia,  HEMATOLOGY: No anemia, easy bruising or bleeding SKIN: No rash or lesion. MUSCULOSKELETAL: No joint pain or arthritis.   NEUROLOGIC: No tingling, numbness, weakness.  PSYCHIATRY: No anxiety or depression.   DRUG ALLERGIES:   Allergies  Allergen Reactions  . Iodine Rash  . Penicillins Hives, Itching, Rash and Other (See Comments)    "broke out in welps" (05/06/2012)  . Sulfa Antibiotics Shortness Of Breath  . Iodine     Rash and itching  . Ivp Dye [Iodinated Diagnostic Agents]     Rash;redness  . Penicillins     Hives  . Sulfa Antibiotics     Difficulty Breathing    VITALS:  Blood pressure 113/71, pulse 109, temperature 98 F (36.7 C), temperature source Oral, resp. rate 16, height 5\' 11"  (1.803 m), weight 99.474 kg (219 lb 4.8 oz), SpO2 93 %.  PHYSICAL EXAMINATION:  VITAL SIGNS: Filed Vitals:   07/28/15 0830 07/28/15 1124  BP: 103/89 113/71  Pulse: 97 109  Temp:  98 F (36.7 C)  Resp:  16   GENERAL:69 y.o.male currently in no acute distress.  HEAD: Normocephalic, atraumatic.  EYES: Pupils equal, round, reactive to light. Extraocular muscles intact. No scleral icterus.  MOUTH: Moist mucosal membrane. Dentition intact. No abscess noted.  EAR, NOSE, THROAT: Clear without exudates. No external lesions.  NECK: Supple. No  thyromegaly. No nodules. No JVD.  PULMONARY: Minimal coarse rhonchi left base otherwise no wheeze rails No use of accessory muscles, Good respiratory effort. good air entry bilaterally CHEST: Nontender to palpation.  CARDIOVASCULAR: S1 and S2. Irregular rate and irregular rhythm. No murmurs, rubs, or gallops. No edema. Pedal pulses 2+ bilaterally.  GASTROINTESTINAL: Soft, nontender, nondistended. No masses. Positive bowel sounds. No hepatosplenomegaly.  MUSCULOSKELETAL: No swelling, clubbing, or edema. Range of motion full in all extremities.  NEUROLOGIC: Cranial nerves II through XII are intact. No gross focal neurological deficits. Sensation intact. Reflexes intact.  SKIN: No ulceration, lesions, rashes, or cyanosis. Skin warm and dry. Turgor intact.  PSYCHIATRIC: Mood, affect within normal limits. The patient is awake, alert and oriented x 3. Insight, judgment intact.      LABORATORY PANEL:   CBC  Recent Labs Lab 07/28/15 1118  WBC 16.2*  HGB 16.7  HCT 48.9  PLT 225   ------------------------------------------------------------------------------------------------------------------  Chemistries   Recent Labs Lab 07/28/15 1118  NA 135  K 4.9  CL 99*  CO2 31  GLUCOSE 243*  BUN 52*  CREATININE 1.46*  CALCIUM 9.3  MG 2.3   ------------------------------------------------------------------------------------------------------------------  Cardiac Enzymes  Recent Labs Lab 07/23/15 0717  TROPONINI <0.03   ------------------------------------------------------------------------------------------------------------------  RADIOLOGY:  No results found.  EKG:   Orders placed or performed during the hospital encounter of 07/23/15  . EKG 12-Lead  . EKG 12-Lead  . ED EKG within 10 minutes  . ED EKG within 10 minutes    ASSESSMENT AND PLAN:   70 year old  Caucasian gentleman admitted 07/23/15 with COPD exacerbation as well as rapid atrial fibrillation  1.Chronic  obstructive pulmonary disease exacerbation: Breathing treatments Convert prednisone, continue Levaquin 5/5. Continue with home medications.  2. Acute on chronic diastolic congestive heart failure: Appreciate cardiology input condition worsened by heart rate is better controlled on amiodarone and metoprolol 3. Hypothyroidism unspecified Synthroid 4. Type 2 diabetes non-insulin-requiring: poor controlled on steroids :increase insulin sliding scale given hyperglycemia had Levemir for basal coverage 5. Venous thromboembolism prophylactic: Lovenox       All the records are reviewed and case discussed with Care Management/Social Workerr. Management plans discussed with the patient, family and they are in agreement.  CODE STATUS: Full  TOTAL TIME TAKING CARE OF THIS PATIENT:28 minutes.   POSSIBLE D/C IN 1 DAYS, DEPENDING ON CLINICAL CONDITION.   Hower,  Karenann Cai.D on 07/28/2015 at 2:53 PM  Between 7am to 6pm - Pager - [phone]  After 6pm: House Pager: - [place] [facility]  Office  [phone]  CC: Primary care physician; [name], MD

## 2015-07-28 NOTE — Progress Notes (Signed)
Inpatient Diabetes Program Recommendations  AACE/ADA: New Consensus Statement on Inpatient Glycemic Control (2015)  Target Ranges:  Prepandial:   less than 140 mg/dL      Peak postprandial:   less than 180 mg/dL (1-2 hours)      Critically ill patients:  140 - 180 mg/dL   Review of Glycemic Control Results for Philip [name], Philip SR. (MRN [id number]) as of 07/28/2015 12:58  Ref. Range 07/27/2015 07:34 07/27/2015 11:16 07/27/2015 16:33 07/27/2015 20:37 07/28/2015 07:38 07/28/2015 11:34  Glucose-Capillary Latest Ref Range: 65-99 mg/dL 163 (H) 261 (H) 114 (H) 299 (H) 143 (H) 282 (H)   Diabetes history: Type 2 diabetes Outpatient Diabetes medications: Metformin 500 mg bid,  Current orders for Inpatient glycemic control: Levemir 15 units q hs + Novolog resistant tid with meals and HS, Metformin 1000 mg bid, Solumedrol 60 mg IV q 24 hours  Inpatient Diabetes Program Recommendations: If patient continues on Solumedrol, please consider adding meal coverage Novolog 5 units tid meal coverage (hold if eats <50%).  Thank you, Nani Gasser. Meilyn Heindl, RN, MSN, CDE Inpatient Glycemic Control Team Team Pager [phone] (8am-5pm) 07/28/2015 1:01 PM

## 2015-07-28 NOTE — Progress Notes (Signed)
Notified physician that patient is complaining of dry eyes, new orders recieved

## 2015-07-29 DIAGNOSIS — R001 Bradycardia, unspecified: Secondary | ICD-10-CM

## 2015-07-29 LAB — GLUCOSE, CAPILLARY
GLUCOSE-CAPILLARY: 146 mg/dL — AB (ref 65–99)
GLUCOSE-CAPILLARY: 158 mg/dL — AB (ref 65–99)

## 2015-07-29 LAB — BASIC METABOLIC PANEL
Anion gap: 7 (ref 5–15)
BUN: 48 mg/dL — AB (ref 6–20)
CALCIUM: 9.2 mg/dL (ref 8.9–10.3)
CHLORIDE: 98 mmol/L — AB (ref 101–111)
CO2: 29 mmol/L (ref 22–32)
CREATININE: 1.47 mg/dL — AB (ref 0.61–1.24)
GFR calc non Af Amer: 47 mL/min — ABNORMAL LOW (ref >60)
GFR, EST AFRICAN AMERICAN: 55 mL/min — AB (ref >60)
Glucose, Bld: 200 mg/dL — ABNORMAL HIGH (ref 65–99)
Potassium: 5 mmol/L (ref 3.5–5.1)
SODIUM: 134 mmol/L — AB (ref 135–145)

## 2015-07-29 MED ORDER — FUROSEMIDE 40 MG PO TABS
40.0000 mg | ORAL_TABLET | Freq: Every day | ORAL | Status: DC
  Administered 2015-07-29: 40 mg via ORAL
  Filled 2015-07-29 (×2): qty 1

## 2015-07-29 MED ORDER — METOPROLOL TARTRATE 75 MG PO TABS: 75.0000 mg | ORAL_TABLET | Freq: Two times a day (BID) | ORAL | Status: DC

## 2015-07-29 MED ORDER — AMIODARONE HCL 200 MG PO TABS: 200.0000 mg | ORAL_TABLET | Freq: Two times a day (BID) | ORAL | Status: DC

## 2015-07-29 MED ORDER — FUROSEMIDE 40 MG PO TABS: 40.0000 mg | ORAL_TABLET | Freq: Every day | ORAL | Status: DC

## 2015-07-29 MED ORDER — PREDNISONE 10 MG (21) PO TBPK: 10.0000 mg | ORAL_TABLET | Freq: Every day | ORAL | Status: DC

## 2015-07-29 NOTE — Care Management Important Message (Signed)
Important Message  Patient Details  Name: Philip PENA Sr. MRN: [id number] Date of Birth: [birth year]   Medicare Important Message Given:  Yes    Beverly Sessions, RN 07/29/2015, 11:35 AM

## 2015-07-29 NOTE — Discharge Summary (Addendum)
Philip [name]    MR#:  [id number]  DATE OF BIRTH:  02/15/1946  DATE OF ADMISSION:  07/23/2015 ADMITTING PHYSICIAN: Theodoro Grist, MD  DATE OF DISCHARGE: No discharge date for patient encounter.  PRIMARY CARE PHYSICIAN: [name], MD    ADMISSION DIAGNOSIS:  Dyspnea [R06.00] COPD exacerbation (HCC) [J44.1]  DISCHARGE DIAGNOSIS:  Principal Problem:   Atrial tachycardia (HCC) Active Problems:   Coronary artery disease   Bradycardia   COPD (chronic obstructive pulmonary disease) (HCC)   (HFpEF) heart failure with preserved ejection fraction (HCC)   Hypotension   COPD exacerbation ([place])   Dyspnea   SECONDARY DIAGNOSIS:   Past Medical History  Diagnosis Date  . Diabetes mellitus without complication ([place])   . Thyroid disease   . Collapse of right lung     a. 12/1967 - ? etiology.  [name] History of bronchitis   . Type II diabetes mellitus ([place])   . Hypothyroidism   . Arthritis     a. right knee  . Hypercholesteremia   . Shingles   . Hypertension   . Coronary artery disease     a. 05/2012 Cath: severe 3VD-->CABG by Dr. Servando Snare in 05/2012 with LIMA to LAD, SVG to LCX & SVG to RPDA.  [name] Asthma   . COPD (chronic obstructive pulmonary disease) [facility])     HOSPITAL COURSE:  Philip [name]  is a 70 y.o. male admitted 07/23/2015 with chief complaint of shortness of breath. Please see H&P performed by Dr.Vaickute for further information. On admission to the hospitalization requiring supplemental oxygen to maintain oxygen saturations greater than 90 %. It was felt that he was dealing with COPD exacerbation placed on steroids as well as antibiotic for bacterial exacerbation. He had continued improvement but then course complicated by atrial tachycardia. He is evaluated by cardiology who assisted in the management of his medications placed him on amiodarone and adjusted his metoprolol. It was felt that the  combination of arrhythmia was enough to go to fluid causing worsening of his respiratory status. Fortunately on the day of discharge he is doing well without oxygen and asymptomatic. Will require follow-up with cardiology as outpatient  DISCHARGE CONDITIONS:   Improved stable  CONSULTS OBTAINED:  Treatment Team:  Lytle Butte, MD Deboraha Sprang, MD  DRUG ALLERGIES:   Allergies  Allergen Reactions  . Iodine Rash  . Penicillins Hives, Itching, Rash and Other (See Comments)    "broke out in welps" (05/06/2012)  . Sulfa Antibiotics Shortness Of Breath  . Iodine     Rash and itching  . Ivp Dye [Iodinated Diagnostic Agents]     Rash;redness  . Penicillins     Hives  . Sulfa Antibiotics     Difficulty Breathing    DISCHARGE MEDICATIONS:   Current Discharge Medication List    START taking these medications   Details  amiodarone (PACERONE) 200 MG tablet Take 1 tablet (200 mg total) by mouth 2 (two) times daily. Qty: 60 tablet, Refills: 0    furosemide (LASIX) 40 MG tablet Take 1 tablet (40 mg total) by mouth daily. Qty: 30 tablet, Refills: 0    predniSONE (STERAPRED UNI-PAK 21 TAB) 10 MG (21) TBPK tablet Take 1 tablet (10 mg total) by mouth daily. 45m oral 1 day, 29moral 2 day, 1056mral 2 days Qty: 10 tablet, Refills: 0      CONTINUE these medications which have CHANGED  Details  metoprolol 75 MG TABS Take 75 mg by mouth 2 (two) times daily. Qty: 30 tablet, Refills: 0      CONTINUE these medications which have NOT CHANGED   Details  albuterol (PROVENTIL) (2.5 MG/3ML) 0.083% nebulizer solution Take 2.5 mg by nebulization every 6 (six) hours as needed for wheezing or shortness of breath.    atorvastatin (LIPITOR) 40 MG tablet TAKE ONE-HALF TABLET BY MOUTH AT BEDTIME Qty: 30 tablet, Refills: 6    ELIQUIS 5 MG TABS tablet TAKE ONE TABLET BY MOUTH TWICE DAILY Qty: 60 tablet, Refills: 6    glucose monitoring kit (FREESTYLE) monitoring kit 1 each by Does not apply  route as needed for other. Qty: 1 each, Refills: 0   Associated Diagnoses: Diabetes mellitus type II, controlled, with no complications (HCC)    LANCETS ULTRA THIN MISC 1 Device by Does not apply route 4 (four) times daily -  before meals and at bedtime. Qty: 200 each, Refills: 5   Associated Diagnoses: Diabetes mellitus type II, controlled, with no complications (HCC)    levothyroxine (SYNTHROID, LEVOTHROID) 75 MCG tablet TAKE ONE TABLET BY MOUTH ONCE DAILY Qty: 30 tablet, Refills: 6    metFORMIN (GLUCOPHAGE) 500 MG tablet Take 2 tablets by mouth 2 (two) times daily.    montelukast (SINGULAIR) 10 MG tablet Take 10 mg by mouth at bedtime.     ONE TOUCH ULTRA TEST test strip USE ONE STRIP TO CHECK GLUCOSE 1-2 TIMES DAILY Qty: 100 each, Refills: 12    PROAIR HFA 108 (90 BASE) MCG/ACT inhaler INHALE TWO PUFFS BY MOUTH 4 TIMES DAILY AS NEEDED Qty: 9 each, Refills: 3    tiotropium (SPIRIVA) 18 MCG inhalation capsule Place 18 mcg into inhaler and inhale daily.       STOP taking these medications     losartan (COZAAR) 100 MG tablet      predniSONE (DELTASONE) 10 MG tablet      fluticasone (FLOVENT HFA) 110 MCG/ACT inhaler      predniSONE (DELTASONE) 20 MG tablet          DISCHARGE INSTRUCTIONS:    DIET:  Diabetic diet  DISCHARGE CONDITION:  Good  ACTIVITY:  Activity as tolerated  OXYGEN:  Home Oxygen: No.   Oxygen Delivery: room air  DISCHARGE LOCATION:  home   If you experience worsening of your admission symptoms, develop shortness of breath, life threatening emergency, suicidal or homicidal thoughts you must seek medical attention immediately by calling 911 or calling your MD immediately  if symptoms less severe.  You Must read complete instructions/literature along with all the possible adverse reactions/side effects for all the Medicines you take and that have been prescribed to you. Take any new Medicines after you have completely understood and accpet all  the possible adverse reactions/side effects.   Please note  You were cared for by a hospitalist during your hospital stay. If you have any questions about your discharge medications or the care you received while you were in the hospital after you are discharged, you can call the unit and asked to speak with the hospitalist on call if the hospitalist that took care of you is not available. Once you are discharged, your primary care physician will handle any further medical issues. Please note that NO REFILLS for any discharge medications will be authorized once you are discharged, as it is imperative that you return to your primary care physician (or establish a relationship with a primary care physician if you  do not have one) for your aftercare needs so that they can reassess your need for medications and monitor your lab values.    On the day of Discharge:   VITAL SIGNS:  Blood pressure 117/75, pulse 48, temperature 97.7 F (36.5 C), temperature source Oral, resp. rate 20, height _0  (1.803 m), weight 99.474 kg (219 lb 4.8 oz), SpO2 98 %.  I/O:   Intake/Output Summary (Last 24 hours) at 07/29/15 1154 Last data filed at 07/29/15 1028  Gross per 24 hour  Intake   2316 ml  Output   1740 ml  Net    576 ml    PHYSICAL EXAMINATION:  GENERAL:  70 y.o.-year-old patient lying in the bed with no acute distress.  EYES: Pupils equal, round, reactive to light and accommodation. No scleral icterus. Extraocular muscles intact.  HEENT: Head atraumatic, normocephalic. Oropharynx and nasopharynx clear.  NECK:  Supple, no jugular venous distention. No thyroid enlargement, no tenderness.  LUNGS: Normal breath sounds bilaterally, no wheezing, rales,rhonchi or crepitation. No use of accessory muscles of respiration.  CARDIOVASCULAR: S1, S2 normal. No murmurs, rubs, or gallops.  ABDOMEN: Soft, non-tender, non-distended. Bowel sounds present. No organomegaly or mass.  EXTREMITIES: No pedal edema,  cyanosis, or clubbing.  NEUROLOGIC: Cranial nerves II through XII are intact. Muscle strength 5/5 in all extremities. Sensation intact. Gait not checked.  PSYCHIATRIC: The patient is alert and oriented x 3.  SKIN: No obvious rash, lesion, or ulcer.   DATA REVIEW:   CBC  Recent Labs Lab 07/28/15 1118  WBC 16.2*  HGB 16.7  HCT 48.9  PLT 225    Chemistries   Recent Labs Lab 07/28/15 1118 07/29/15 0936  NA 135 134*  K 4.9 5.0  CL 99* 98*  CO2 31 29  GLUCOSE 243* 200*  BUN 52* 48*  CREATININE 1.46* 1.47*  CALCIUM 9.3 9.2  MG 2.3  --     Cardiac Enzymes  Recent Labs Lab 07/23/15 0717  TROPONINI <0.03    Microbiology Results  Results for orders placed or performed during the hospital encounter of 07/23/15  Blood culture (routine x 2)     Status: None   Collection Time: 07/23/15  9:43 AM  Result Value Ref Range Status   Specimen Description BLOOD RIGHT ANTECUBITAL  Final   Special Requests BOTTLES DRAWN AEROBIC AND ANAEROBIC  5CC  Final   Culture NO GROWTH 5 DAYS  Final   Report Status 07/28/2015 FINAL  Final  Blood culture (routine x 2)     Status: None   Collection Time: 07/23/15  9:43 AM  Result Value Ref Range Status   Specimen Description BLOOD LEFT HAND  Final   Special Requests BOTTLES DRAWN AEROBIC AND ANAEROBIC  [place]  Final   Culture NO GROWTH 5 DAYS  Final   Report Status 07/28/2015 FINAL  Final    RADIOLOGY:  No results found.   Management plans discussed with the patient, family and they are in agreement.  CODE STATUS:     Code Status Orders        Start     Ordered   07/23/15 2200  Limited resuscitation (code)   Continuous    Question Answer Comment  In the event of cardiac or respiratory ARREST: Initiate Code Blue, Call Rapid Response Yes   In the event of cardiac or respiratory ARREST: Perform CPR Yes   In the event of cardiac or respiratory ARREST: Perform Intubation/Mechanical Ventilation No   In the event  of cardiac or  respiratory ARREST: Use NIPPV/BiPAp only if indicated Yes   In the event of cardiac or respiratory ARREST: Administer ACLS medications if indicated Yes   In the event of cardiac or respiratory ARREST: Perform Defibrillation or Cardioversion if indicated Yes      07/23/15 2159    Code Status History    Date Active Date Inactive Code Status Order ID Comments User Context   05/10/2012  7:33 AM 05/13/2012  2:27 PM Full Code [id number]  [name], MD Inpatient   05/07/2012  1:22 PM 05/10/2012  7:33 AM Full Code [id number]  Lucy Chris, RN Inpatient      TOTAL TIME TAKING CARE OF THIS PATIENT: 28 minutes.    Minerva Bluett,  Karenann Cai.D on 07/29/2015 at 11:54 AM  Between 7am to 6pm - Pager - [phone]  After 6pm go to [email or web address] - password [email or web address] [place] [facility]  Office  [phone]  CC: Primary care physician; [name], MD

## 2015-07-29 NOTE — Progress Notes (Signed)
Inpatient Diabetes Program Recommendations  AACE/ADA: New Consensus Statement on Inpatient Glycemic Control (2015)  Target Ranges:  Prepandial:   less than 140 mg/dL      Peak postprandial:   less than 180 mg/dL (1-2 hours)      Critically ill patients:  140 - 180 mg/dL   Review of Glycemic Control  Results for Philip [name], Philip [name]. (MRN [id number]) as of 07/29/2015 10:17  Ref. Range 07/28/2015 07:38 07/28/2015 11:34 07/28/2015 16:31 07/28/2015 20:50 07/29/2015 08:03  Glucose-Capillary Latest Ref Range: 65-99 mg/dL 143 (H) 282 (H) 177 (H) 239 (H) 146 (H)    Diabetes history: Type 2 diabetes Outpatient Diabetes medications: Metformin 500 mg bid,  Current orders for Inpatient glycemic control: Levemir 15 units q hs + Novolog resistant tid with meals and HS, Metformin 1000 mg bid, Solumedrol 60 mg IV q 24 hours  Inpatient Diabetes Program Recommendations: If patient continues on Solumedrol, please consider adding meal coverage Novolog 5 units tid meal coverage (hold if eats <50%).  If it is ordered as mealtime, he will consistently receive the insulin for the meal and correction to bring down the high sugar he has going into the meal.   Gentry Fitz, RN, BA, [place], CDE Diabetes Coordinator Inpatient Diabetes Program  [phone] (Team Pager) [phone] ([place]) 07/29/2015 10:18 AM

## 2015-07-29 NOTE — Progress Notes (Signed)
Patient d/c'd home. Education provided, no questions at this time. Patient picked up by wife. Telemetry removed. Bernard Donahoo R [place]   

## 2015-07-29 NOTE — Progress Notes (Signed)
Patient: Philip VANDYKEN Sr. / Admit Date: 07/23/2015 / Date of Encounter: 07/29/2015, 8:49 AM   Subjective: Breathing better. Less SOB with ambulation. Less nausea. Still in the positive being +2.1 L for the admission.   Review of Systems: Review of Systems  Constitutional: Positive for malaise/fatigue. Negative for fever, chills, weight loss and diaphoresis.  HENT: Negative for congestion.   Eyes: Negative for discharge and redness.  Respiratory: Positive for shortness of breath and wheezing. Negative for cough, hemoptysis and sputum production.   Cardiovascular: Negative for chest pain, palpitations, orthopnea, claudication, leg swelling and PND.  Gastrointestinal: Negative for nausea and vomiting.  Musculoskeletal: Negative for myalgias and falls.  Skin: Negative for rash.  Neurological: Negative for sensory change, speech change, focal weakness, loss of consciousness and weakness.  Endo/Heme/Allergies: Does not bruise/bleed easily.  Psychiatric/Behavioral: The patient is not nervous/anxious.      Objective: Telemetry: currently in sinus bradycardia in the upper 50's, technicians working on computer - unable to review prior tele Physical Exam: Blood pressure 130/97, pulse 108, temperature 97.5 F (36.4 C), temperature source Oral, resp. rate 18, height 5\' 11"  (1.803 m), weight 219 lb 4.8 oz (99.474 kg), SpO2 94 %. Body mass index is 30.6 kg/(m^2). General: Well developed, well nourished, in no acute distress. Head: Normocephalic, atraumatic, sclera non-icteric, no xanthomas, nares are without discharge. Neck: Negative for carotid bruits. JVP not elevated. Lungs: Clear bilaterally to auscultation without wheezes, rales, or rhonchi. Breathing is unlabored. Heart: Bradycardic S1 S2 without murmurs, rubs, or gallops.  Abdomen: Soft, non-tender, non-distended with normoactive bowel sounds. No rebound/guarding. Extremities: No clubbing or cyanosis. No edema. Distal pedal pulses are 2+  and equal bilaterally. Neuro: Alert and oriented X 3. Moves all extremities spontaneously. Psych:  Responds to questions appropriately with a normal affect.   Intake/Output Summary (Last 24 hours) at 07/29/15 0849 Last data filed at 07/29/15 0008  Gross per 24 hour  Intake   2316 ml  Output   1440 ml  Net    876 ml    Inpatient Medications:  . amiodarone  200 mg Oral BID  . atorvastatin  20 mg Oral QHS  . enoxaparin (LOVENOX) injection  40 mg Subcutaneous Q24H  . fluticasone  1 spray Each Nare Daily  . insulin aspart  0-20 Units Subcutaneous TID WC  . insulin aspart  0-5 Units Subcutaneous QHS  . insulin detemir  15 Units Subcutaneous QHS  . levofloxacin  500 mg Oral Daily  . levothyroxine  75 mcg Oral QAC breakfast  . metFORMIN  1,000 mg Oral BID WC  . metoprolol  75 mg Oral BID  . montelukast  10 mg Oral QHS  . predniSONE  40 mg Oral Q breakfast  . sodium chloride flush  3 mL Intravenous Q12H  . tiotropium  18 mcg Inhalation Daily   Infusions:    Labs:  Recent Labs  07/28/15 1118  NA 135  K 4.9  CL 99*  CO2 31  GLUCOSE 243*  BUN 52*  CREATININE 1.46*  CALCIUM 9.3  MG 2.3   No results for input(s): AST, ALT, ALKPHOS, BILITOT, PROT, ALBUMIN in the last 72 hours.  Recent Labs  07/28/15 1118  WBC 16.2*  HGB 16.7  HCT 48.9  MCV 90.5  PLT 225   No results for input(s): CKTOTAL, CKMB, TROPONINI in the last 72 hours. Invalid input(s): POCBNP No results for input(s): HGBA1C in the last 72 hours.   Weights: [employer]  07/23/15 0656 07/23/15 2202  Weight: 225 lb (102.059 kg) 219 lb 4.8 oz (99.474 kg)     Radiology/Studies:  Nm Pulmonary Perf And Vent  07/23/2015  CLINICAL DATA:  Dyspnea and iodine allergy.  COPD. EXAM: NUCLEAR MEDICINE VENTILATION - PERFUSION LUNG SCAN TECHNIQUE: Ventilation images were obtained in multiple projections using inhaled aerosol Tc-28m DTPA. Perfusion images were obtained in multiple projections after intravenous  injection of Tc-3m MAA. RADIOPHARMACEUTICALS:  31.474 mCi Technetium-58m DTPA aerosol inhalation and 4.024 mCi Technetium-23m MAA IV COMPARISON:  Chest radiograph 07/23/2015 FINDINGS: Ventilation: Diffusely heterogeneous tracer activity throughout both lungs with pooling of activity in the tracheobronchial tree. Appearance is consistent with emphysema and air trapping. Perfusion: Mildly heterogeneous distribution tracer activity throughout both lungs in a nonsegmental pattern likely related to emphysema and air trapping. No focal segmental perfusion defects identified. IMPRESSION: Low probability of pulmonary embolus.  Emphysematous changes. Electronically Signed   By: Lucienne Capers M.D.   On: 07/23/2015 20:58   Dg Chest Portable 1 View  07/23/2015  CLINICAL DATA:  Shortness of Breath EXAM: PORTABLE CHEST 1 VIEW COMPARISON:  03/09/2015 FINDINGS: The heart size and mediastinal contours are within normal limits. Both lungs are clear. The visualized skeletal structures are unremarkable. IMPRESSION: No active disease. Electronically Signed   By: Inez Catalina M.D.   On: 07/23/2015 07:26     Assessment and Plan   1. Paroxysmal atrial tachycardia: -Currently in sinus rhythm with bradycardic rates in the upper 50's -Continue Lopressor 75 mg bid and amiodarone 200 mg bid -Amiodarone decreased to 200 mg bid given nausea which has helped   2. Acute on chronic diastolic CHF (HFpEF): -Echo showed normal LV systolic function,  -Lopressor as above -Still 2 L positive for the admission  3. COPD exacerbation: -Steroids, ABX, and inhalers per IM  4. CAD s/p CABG 2014: -Outpatient Lexiscan, only after copd exacerbation has resolved  5. Acute renal injury: -Hold Lasix this morning -Check bmet  6. HLD: -Lipitor 20 mg  7. Leukocytosis: -Likely 2/2 steroids   8. HTN: -Would hold losartan to allow up titration of metoprolol, BP borderline low   Signed, Christell Faith, [name] Pager: [phone] 07/29/2015, 8:49 AM

## 2015-07-29 NOTE — Therapy (Signed)
Physical Therapy Treatment Patient Details Name: GILLES HUSTEAD Sr. MRN: [id number] DOB: [birth year] Today's Date: 08-23-2015    History of Present Illness      PT Comments    PT up in chair ready for session.  Reports ambulating in room and on unit with wife with no difficulties.  Ambulated around nursing unit with writer without loss of balance.  Pt with no questions or concerns and reports being comfortable with mobility.  Follow Up Recommendations        Equipment Recommendations       Recommendations for Other Services       Precautions / Restrictions Precautions Precautions: None Restrictions Weight Bearing Restrictions: No    Mobility  Bed Mobility Overal bed mobility: Independent             General bed mobility comments: No deficits observed.   Transfers Overall transfer level: Independent               General transfer comment: No deficits with standing balance.   Ambulation/Gait Ambulation/Gait assistance: Supervision Ambulation Distance (Feet): 250 Feet Assistive device: None Gait Pattern/deviations: WFL(Within Functional Limits)   Gait velocity interpretation: Below normal speed for age/gender General Gait Details: Mild drifting laterally at times while looking horizontally, otherwise this appears to be his baseline, though perhaps decreased speed.    Stairs            Wheelchair Mobility    Modified Rankin (Stroke Patients Only)       Balance                                    Cognition Arousal/Alertness: Awake/alert Behavior During Therapy: WFL for tasks assessed/performed Overall Cognitive Status: Within Functional Limits for tasks assessed                      Exercises      General Comments        Pertinent Vitals/Pain Pain Assessment: No/denies pain    Home Living                      Prior Function            PT Goals (current goals can now be found in the care plan  section) Acute Rehab PT Goals Patient Stated Goal: To return home     Frequency  Min 2X/week    PT Plan      Co-evaluation             End of Session Equipment Utilized During Treatment: Gait belt Activity Tolerance: Patient tolerated treatment well Patient left: in chair;with family/visitor present     Time: [id number] PT Time Calculation (min) (ACUTE ONLY): 15 min  Charges:  $Gait Training: 8-22 mins                    G Codes:     Chesley Noon, PTA 2015/08/23, 10:07 AM

## 2015-08-02 ENCOUNTER — Ambulatory Visit (INDEPENDENT_AMBULATORY_CARE_PROVIDER_SITE_OTHER): Payer: Medicare Other | Admitting: Cardiovascular Disease

## 2015-08-02 ENCOUNTER — Telehealth: Payer: Self-pay | Admitting: Cardiovascular Disease

## 2015-08-02 ENCOUNTER — Encounter (INDEPENDENT_AMBULATORY_CARE_PROVIDER_SITE_OTHER): Payer: Self-pay

## 2015-08-02 ENCOUNTER — Encounter: Payer: Self-pay | Admitting: Cardiovascular Disease

## 2015-08-02 VITALS — BP 104/64 | HR 104 | Ht 67.0 in | Wt 206.5 lb

## 2015-08-02 DIAGNOSIS — I2510 Atherosclerotic heart disease of native coronary artery without angina pectoris: Secondary | ICD-10-CM | POA: Diagnosis not present

## 2015-08-02 DIAGNOSIS — I10 Essential (primary) hypertension: Secondary | ICD-10-CM | POA: Diagnosis not present

## 2015-08-02 DIAGNOSIS — I480 Paroxysmal atrial fibrillation: Secondary | ICD-10-CM

## 2015-08-02 DIAGNOSIS — I471 Supraventricular tachycardia: Secondary | ICD-10-CM

## 2015-08-02 MED ORDER — LEVOFLOXACIN 500 MG PO TABS: 500.0000 mg | ORAL_TABLET | Freq: Every day | ORAL | Status: DC

## 2015-08-02 NOTE — Telephone Encounter (Signed)
Pharmacy calling wanting to let us know that the antibiotic that we sent in, is interacting with Amiodarone  Would like to know if this is okay Please advise

## 2015-08-02 NOTE — Patient Instructions (Signed)
Medication Instructions:  Your physician has recommended you make the following change in your medication:  START taking levaquin 500mg  once a day for 7 days   Labwork: none  Testing/Procedures: Your physician has recommended that you wear a holter monitor. Holter monitors are medical devices that record the heart's electrical activity. Doctors most often use these monitors to diagnose arrhythmias. Arrhythmias are problems with the speed or rhythm of the heartbeat. The monitor is a small, portable device. You can wear one while you do your normal daily activities. This is usually used to diagnose what is causing palpitations/syncope (passing out).    Follow-Up: Your physician recommends that you schedule a follow-up appointment in: 1 month with Dr. Fletcher Anon.    Any Other Special Instructions Will Be Listed Below (If Applicable).     If you need a refill on your cardiac medications before your next appointment, please call your pharmacy.  Holter Monitoring A Holter monitor is a small device that is used to detect abnormal heart rhythms. It clips to your clothing and is connected by wires to flat, sticky disks (electrodes) that attach to your chest. It is worn continuously for 24-48 hours. HOME CARE INSTRUCTIONS  Wear your Holter monitor at all times, even while exercising and sleeping, for as long as directed by your health care provider.  Make sure that the Holter monitor is safely clipped to your clothing or close to your body as recommended by your health care provider.  Do not get the monitor or wires wet.  Do not put body lotion or moisturizer on your chest.  Keep your skin clean.  Keep a diary of your daily activities, such as walking and doing chores. If you feel that your heartbeat is abnormal or that your heart is fluttering or skipping a beat:  Record what you are doing when it happens.  Record what time of day the symptoms occur.  Return your Holter monitor as  directed by your health care provider.  Keep all follow-up visits as directed by your health care provider. This is important. SEEK IMMEDIATE MEDICAL CARE IF:  You feel lightheaded or you faint.  You have trouble breathing.  You feel pain in your chest, upper arm, or jaw.  You feel sick to your stomach and your skin is pale, cool, or damp.  You heartbeat feels unusual or abnormal.   This information is not intended to replace advice given to you by your health care provider. Make sure you discuss any questions you have with your health care provider.   Document Released: 01/14/2004 Document Revised: 05/08/2014 Document Reviewed: 11/24/2013 Elsevier Interactive Patient Education [employer].

## 2015-08-02 NOTE — Progress Notes (Signed)
Cardiology Office Note   Date:  08/02/2015   ID:  Philip SOBOTKA Sr., DOB Apr 10, 1946, MRN [id number]  PCP:  [name], MD  Cardiologist:   [name], MD   Chief Complaint  Patient presents with  . other    6 month follow up as well as follow up from [facility]. Pt. c/o not having any energy and shortness of breath.       History of Present Illness: Philip [name]. is a 70 y.o. male who presents for A follow-up visit regarding recent hospitalization for bronchitis and atrial tachycardia. He has known history of coronary artery disease and atrial tachycardia .  He is status post CABG in January of 2014.  He had postoperative atrial fibrillation which was treated with amiodarone.    He is known to have frequent PACs and atrial tachycardia with pauses.  Echocardiogram in May 2015 showed low normal LV systolic function with ejection fraction of 50-55% with mild mitral regurgitation. In August 2016 he had suspected atrial fibrillation on his EKG and thus he was started on Eliquis. He was hospitalized recently at [facility] for shortness of breath, cough, congestion and palpitations. He was treated with steroids but not antibiotics. He was seen by Dr. Caryl Comes who started him on amiodarone to suppress his atrial arrhythmia. EKG showed recurrent atrial tachycardia. Since hospital discharge, he continues to complain of persistent symptoms and still does not feel back to himself. He had an echocardiogram done while hospitalized which showed stable LV systolic function with an ejection fraction of 50-55%.    Past Medical History  Diagnosis Date  . Diabetes mellitus without complication ([place])   . Thyroid disease   . Collapse of right lung     a. 12/1967 - ? etiology.  [name] History of bronchitis   . Type II diabetes mellitus ([place])   . Hypothyroidism   . Arthritis     a. right knee  . Hypercholesteremia   . Shingles   . Hypertension   . Coronary artery disease     a. 05/2012 Cath: severe  3VD-->CABG by Dr. Servando Snare in 05/2012 with LIMA to LAD, SVG to LCX & SVG to RPDA.  [name] Asthma   . COPD (chronic obstructive pulmonary disease) [facility])     Past Surgical History  Procedure Laterality Date  . Knee surgery  2003    right  . Neck surgery  2008    Plate in neck  . Knee arthroscopy  ~ 2000    "right" (05/06/2012)  . Anterior cervical decomp/discectomy fusion  ~ 2009  . Intraoperative transesophageal echocardiogram  05/07/2012    Procedure: INTRAOPERATIVE TRANSESOPHAGEAL ECHOCARDIOGRAM;  Surgeon: [name], MD;  Location: [place];  Service: Open Heart Surgery;  Laterality: N/A;  . Coronary artery bypass graft  05/07/2012    Procedure: CORONARY ARTERY BYPASS GRAFTING (CABG);  Surgeon: [name], MD;  Location: [place];  Service: Open Heart Surgery;  Laterality: N/A;  times three  . Cardiac catheterization  05/06/2012    Significant three-vessel coronary artery disease with normal ejection fraction     Current Outpatient Prescriptions  Medication Sig Dispense Refill  . albuterol (PROVENTIL) (2.5 MG/3ML) 0.083% nebulizer solution Take 2.5 mg by nebulization every 6 (six) hours as needed for wheezing or shortness of breath.    [name] amiodarone (PACERONE) 200 MG tablet Take 1 tablet (200 mg total) by mouth 2 (two) times daily. 60 tablet 0  . atorvastatin (LIPITOR) 40 MG  tablet TAKE ONE-HALF TABLET BY MOUTH AT BEDTIME 30 tablet 6  . ELIQUIS 5 MG TABS tablet TAKE ONE TABLET BY MOUTH TWICE DAILY 60 tablet 6  . furosemide (LASIX) 40 MG tablet Take 1 tablet (40 mg total) by mouth daily. 30 tablet 0  . glucose monitoring kit (FREESTYLE) monitoring kit 1 each by Does not apply route as needed for other. 1 each 0  . LANCETS ULTRA THIN MISC 1 Device by Does not apply route 4 (four) times daily -  before meals and at bedtime. 200 each 5  . levothyroxine (SYNTHROID, LEVOTHROID) 75 MCG tablet TAKE ONE TABLET BY MOUTH ONCE DAILY 30 tablet 6  . metFORMIN (GLUCOPHAGE) 500 MG tablet Take 2 tablets  by mouth 2 (two) times daily.    . metoprolol 75 MG TABS Take 75 mg by mouth 2 (two) times daily. 30 tablet 0  . montelukast (SINGULAIR) 10 MG tablet Take 10 mg by mouth at bedtime.     . ONE TOUCH ULTRA TEST test strip USE ONE STRIP TO CHECK GLUCOSE 1-2 TIMES DAILY 100 each 12  . predniSONE (STERAPRED UNI-PAK 21 TAB) 10 MG (21) TBPK tablet Take 1 tablet (10 mg total) by mouth daily. 15m oral 1 day, 291moral 2 day, 1049mral 2 days 10 tablet 0  . PROAIR HFA 108 (90 BASE) MCG/ACT inhaler INHALE TWO PUFFS BY MOUTH 4 TIMES DAILY AS NEEDED 9 each 3  . tiotropium (SPIRIVA) 18 MCG inhalation capsule Place 18 mcg into inhaler and inhale daily.     . lMarland Kitchenvofloxacin (LEVAQUIN) 500 MG tablet Take 1 tablet (500 mg total) by mouth daily. For 7 days 7 tablet 0   Current Facility-Administered Medications  Medication Dose Route Frequency Provider Last Rate Last Dose  . ipratropium-albuterol (DUONEB) 0.5-2.5 (3) MG/3ML nebulizer solution 3 mL  3 mL Nebulization Once Amy Lauren Krebs, NP        Allergies:   Iodine; Penicillins; Sulfa antibiotics; Iodine; Ivp dye; Penicillins; and Sulfa antibiotics    Social History:  The patient  reports that he quit smoking about 47 years ago. His smoking use included Cigarettes. He has a 8 pack-year smoking history. He has never used smokeless tobacco. He reports that he does not drink alcohol or use illicit drugs.   Family History:  The patient's family history includes Clotting disorder in his mother; Heart disease in his mother; Hypertension in his sister.    ROS:  Please see the history of present illness.   Otherwise, review of systems are positive for none.   All other systems are reviewed and negative.    PHYSICAL EXAM: VS:  BP 104/64 mmHg  Pulse 104  Ht 5' 7"  (1.702 m)  Wt 206 lb 8 oz (93.668 kg)  BMI 32.33 kg/m2 , BMI Body mass index is 32.33 kg/(m^2). GEN: Well nourished, well developed, in no acute distress HEENT: normal Neck: no JVD, carotid bruits, or  masses Cardiac: RRR; no murmurs, rubs, or gallops,no edema  Respiratory: He has crackles at the right lower base with bronchial breath sounds, normal work of breathing GI: soft, nontender, nondistended, + BS MS: no deformity or atrophy Skin: warm and dry, no rash Neuro:  Strength and sensation are intact Psych: euthymic mood, full affect   EKG:  EKG is ordered today. The ekg ordered today demonstrates ectopic atrial tachycardia with a heart rate of 104 bpm nonspecific ST and T wave changes.  Recent Labs: 12/14/2014: ALT 26 07/23/2015: TSH 4.210 07/24/2015: B Natriuretic  Peptide 397.0* 07/28/2015: Hemoglobin 16.7; Magnesium 2.3; Platelets 225 07/29/2015: BUN 48*; Creatinine, Ser 1.47*; Potassium 5.0; Sodium 134*    Lipid Panel    Component Value Date/Time   CHOL 139 12/14/2014 0834   TRIG 127 12/14/2014 0834   HDL 43 12/14/2014 0834   CHOLHDL 3.2 12/14/2014 0834   LDLCALC 71 12/14/2014 0834      Wt Readings from Last 3 Encounters:  08/02/15 206 lb 8 oz (93.668 kg)  07/23/15 219 lb 4.8 oz (99.474 kg)  03/09/15 223 lb 9.6 oz (101.424 kg)        ASSESSMENT AND PLAN:  1.   possible right lower lobe pneumonia: He has persistent symptoms of an upper respiratory tract infection and by exam he has possible right lower lobe pneumonia. Thus, I elected to start him on Levaquin 500 mg once daily. I am aware of the interaction with amiodarone but unfortunately he is allergic to penicillin and sulfa drugs. His QTc interval today seems okay. I advised him to follow-up with Dr. Raul Del for known COPD.  2. Paroxysmal atrial fibrillation: He is currently on anticoagulation with Eliquis.  3. Atrial tachycardia with pauses: He reports intermittent heart rate in the 30s at home with dizziness. He was recently started on amiodarone with the hope of suppressing his atrial arrhythmia. I requested a 48-hour Holter monitor.  4. Coronary artery disease involving native coronary arteries without  angina: I suspect that his current symptoms are likely due to his lung disease. If he continues to have problems, I would consider stress testing.  5. Essential hypertension: Blood pressure is well controlled.  6. Hyperlipidemia: Continue treatment with atorvastatin. Most recent LDL was 71.   Disposition:   FU with me in 1 month  Signed,  [name], MD  08/02/2015 7:15 PM    [facility]

## 2015-08-02 NOTE — Telephone Encounter (Signed)
Per verbal from Dr. Fletcher Anon, pt may take levaquin with amiodarone Informed Heather, [facility] pharmacist, who verbalized understanding of instructions.

## 2015-08-05 ENCOUNTER — Encounter: Payer: Self-pay | Admitting: Family Medicine

## 2015-08-05 ENCOUNTER — Ambulatory Visit
Admission: RE | Admit: 2015-08-05 | Discharge: 2015-08-05 | Disposition: A | Discharge status: Home / Self Care | Payer: Medicare Other | Source: Ambulatory Visit | Attending: Family Medicine | Admitting: Family Medicine

## 2015-08-05 ENCOUNTER — Ambulatory Visit (INDEPENDENT_AMBULATORY_CARE_PROVIDER_SITE_OTHER): Payer: Medicare Other | Admitting: Family Medicine

## 2015-08-05 VITALS — BP 101/62 | HR 100 | Temp 98.4°F | Resp 16 | Ht 67.0 in | Wt 205.0 lb

## 2015-08-05 DIAGNOSIS — R0602 Shortness of breath: Secondary | ICD-10-CM | POA: Diagnosis not present

## 2015-08-05 DIAGNOSIS — J449 Chronic obstructive pulmonary disease, unspecified: Secondary | ICD-10-CM

## 2015-08-05 DIAGNOSIS — J45909 Unspecified asthma, uncomplicated: Secondary | ICD-10-CM | POA: Diagnosis not present

## 2015-08-05 DIAGNOSIS — J189 Pneumonia, unspecified organism: Secondary | ICD-10-CM

## 2015-08-05 DIAGNOSIS — R112 Nausea with vomiting, unspecified: Secondary | ICD-10-CM | POA: Diagnosis not present

## 2015-08-05 MED ORDER — ONDANSETRON HCL 4 MG PO TABS: 4.0000 mg | ORAL_TABLET | Freq: Three times a day (TID) | ORAL | Status: DC | PRN

## 2015-08-05 NOTE — Patient Instructions (Signed)
Keep directions and appts with Dr, Fletcher Anon' Call and [facility] appt. With Dr. Raul Del ASAP Restart Spriva and Nebulizer (albuterol) if needed. To [facility] if feeling any worse over next few days.

## 2015-08-05 NOTE — Progress Notes (Signed)
Name: Philip GALENTINE Sr.   MRN: [id number]    DOB: 1945-10-27   Date:08/05/2015       Progress Note  Subjective  Chief Complaint  Chief Complaint  Patient presents with  . Breathing Problem    admitted 07/23/2015 discharges 07/29/2015 still having tightness saw cardio 08/02/2015.  [name] Pneumonia    dx in hosp 07/23/2015    HPI Here for f/u of pneumonia, COPD, CHF.  Hosp. 07/23/15-07/29/15.  Is just finishing up Levaquin and Prednisone.  Still very SOB.  Has not been using his inhalers because of instructions by hosp.  He did use Nebulizer this AM b/o tight chest and it seemed to help. He cont. To be nauseated and has not eaten much.  Also not sleeping well b/o SOB.  He sleeps with head elevated.   His O2 now is 98 however.    He has seen Dr. Fletcher Anon earlier ths week and more tests are planned at end of month.  Has not seen Dr. Raul Del since [place].  No problem-specific assessment & plan notes found for this encounter.   Past Medical History  Diagnosis Date  . Diabetes mellitus without complication ([place])   . Thyroid disease   . Collapse of right lung     a. 12/1967 - ? etiology.  [name] History of bronchitis   . Type II diabetes mellitus ([place])   . Hypothyroidism   . Arthritis     a. right knee  . Hypercholesteremia   . Shingles   . Hypertension   . Coronary artery disease     a. 05/2012 Cath: severe 3VD-->CABG by Dr. Servando Snare in 05/2012 with LIMA to LAD, SVG to LCX & SVG to RPDA.  [name] Asthma   . COPD (chronic obstructive pulmonary disease) [facility])     Past Surgical History  Procedure Laterality Date  . Knee surgery  2003    right  . Neck surgery  2008    Plate in neck  . Knee arthroscopy  ~ 2000    "right" (05/06/2012)  . Anterior cervical decomp/discectomy fusion  ~ 2009  . Intraoperative transesophageal echocardiogram  05/07/2012    Procedure: INTRAOPERATIVE TRANSESOPHAGEAL ECHOCARDIOGRAM;  Surgeon: [name], MD;  Location: [place];  Service: Open Heart Surgery;  Laterality: N/A;  .  Coronary artery bypass graft  05/07/2012    Procedure: CORONARY ARTERY BYPASS GRAFTING (CABG);  Surgeon: [name], MD;  Location: [place];  Service: Open Heart Surgery;  Laterality: N/A;  times three  . Cardiac catheterization  05/06/2012    Significant three-vessel coronary artery disease with normal ejection fraction    Family History  Problem Relation Age of Onset  . Heart disease Mother   . Clotting disorder Mother   . Hypertension Sister     Social History   Social History  . Marital Status: Married    Spouse Name: N/A  . Number of Children: N/A  . Years of Education: N/A   Occupational History  . Not on file.   Social History Main Topics  . Smoking status: Former Smoker -- 1.00 packs/day for 8 years    Types: Cigarettes    Quit date: 12/31/1967  . Smokeless tobacco: Never Used  . Alcohol Use: No     Comment: 05/06/2012 "last alcohol several years ago; never had problem wit"  . Drug Use: No  . Sexual Activity: Not Currently   Other Topics Concern  . Not on file   Social History Narrative   **  Merged History Encounter **         Current outpatient prescriptions:  .  albuterol (PROVENTIL) (2.5 MG/3ML) 0.083% nebulizer solution, Take 2.5 mg by nebulization every 6 (six) hours as needed for wheezing or shortness of breath., Disp: , Rfl:  .  amiodarone (PACERONE) 200 MG tablet, Take 1 tablet (200 mg total) by mouth 2 (two) times daily., Disp: 60 tablet, Rfl: 0 .  atorvastatin (LIPITOR) 40 MG tablet, TAKE ONE-HALF TABLET BY MOUTH AT BEDTIME, Disp: 30 tablet, Rfl: 6 .  ELIQUIS 5 MG TABS tablet, TAKE ONE TABLET BY MOUTH TWICE DAILY, Disp: 60 tablet, Rfl: 6 .  esomeprazole (NEXIUM) 10 MG packet, Take by mouth., Disp: , Rfl:  .  furosemide (LASIX) 40 MG tablet, Take 1 tablet (40 mg total) by mouth daily., Disp: 30 tablet, Rfl: 0 .  glucose monitoring kit (FREESTYLE) monitoring kit, 1 each by Does not apply route as needed for other., Disp: 1 each, Rfl: 0 .  LANCETS  ULTRA THIN MISC, 1 Device by Does not apply route 4 (four) times daily -  before meals and at bedtime., Disp: 200 each, Rfl: 5 .  levofloxacin (LEVAQUIN) 500 MG tablet, Take 1 tablet (500 mg total) by mouth daily. For 7 days, Disp: 7 tablet, Rfl: 0 .  levothyroxine (SYNTHROID, LEVOTHROID) 75 MCG tablet, TAKE ONE TABLET BY MOUTH ONCE DAILY, Disp: 30 tablet, Rfl: 6 .  metFORMIN (GLUCOPHAGE) 500 MG tablet, Take 2 tablets by mouth 2 (two) times daily., Disp: , Rfl:  .  metoprolol 75 MG TABS, Take 75 mg by mouth 2 (two) times daily., Disp: 30 tablet, Rfl: 0 .  montelukast (SINGULAIR) 10 MG tablet, Take 10 mg by mouth at bedtime. , Disp: , Rfl:  .  ONE TOUCH ULTRA TEST test strip, USE ONE STRIP TO CHECK GLUCOSE 1-2 TIMES DAILY, Disp: 100 each, Rfl: 12 .  predniSONE (DELTASONE) 10 MG tablet, , Disp: , Rfl:  .  tiotropium (SPIRIVA) 18 MCG inhalation capsule, Place 18 mcg into inhaler and inhale daily. , Disp: , Rfl:  .  ondansetron (ZOFRAN) 4 MG tablet, Take 1 tablet (4 mg total) by mouth every 8 (eight) hours as needed for nausea or vomiting., Disp: 20 tablet, Rfl: 0 .  PROAIR HFA 108 (90 BASE) MCG/ACT inhaler, INHALE TWO PUFFS BY MOUTH 4 TIMES DAILY AS NEEDED (Patient not taking: Reported on 08/05/2015), Disp: 9 each, Rfl: 3  Allergies  Allergen Reactions  . Iodine Rash  . Penicillins Hives, Itching, Rash and Other (See Comments)    "broke out in welps" (05/06/2012)  . Sulfa Antibiotics Shortness Of Breath  . Iodine     Rash and itching  . Ivp Dye [Iodinated Diagnostic Agents]     Rash;redness  . Penicillins     Hives  . Sulfa Antibiotics     Difficulty Breathing     Review of Systems  Constitutional: Positive for weight loss. Negative for fever, chills and malaise/fatigue.  HENT: Negative for hearing loss.   Eyes: Negative for blurred vision and double vision.  Respiratory: Positive for cough, sputum production, shortness of breath and wheezing.   Cardiovascular: Negative for chest pain,  palpitations and leg swelling.  Gastrointestinal: Positive for nausea. Negative for heartburn, vomiting, abdominal pain, diarrhea and blood in stool.  Genitourinary: Negative for dysuria, urgency and frequency.  Skin: Negative for rash.  Neurological: Negative for weakness and headaches.      Objective  Filed Vitals:   08/05/15 1336 08/05/15 1422  BP: 101/62   Pulse: 98 100  Temp: 98.4 F (36.9 C)   TempSrc: Oral   Resp: 16   Height: 5' 7"  (1.702 m)   Weight: 205 lb (92.987 kg)   SpO2: 98%     Physical Exam  Constitutional: He is oriented to person, place, and time. He appears distressed (SOB with any activity).  HENT:  Head: Normocephalic and atraumatic.  Neck: Normal range of motion. Neck supple. Carotid bruit is not present. No thyromegaly present.  Cardiovascular: Regular rhythm and normal heart sounds.  Tachycardia present.  Exam reveals no gallop and no friction rub.   No murmur heard. Pulmonary/Chest: He is in respiratory distress (mild resp distress with walking). He has no wheezes. He has no rales.  Abdominal: Soft. Bowel sounds are normal. He exhibits no distension and no mass. There is no tenderness.  Vomiting in office x 1  Musculoskeletal: He exhibits no edema.  Lymphadenopathy:    He has no cervical adenopathy.  Neurological: He is alert and oriented to person, place, and time.  Vitals reviewed.      Recent Results (from the past 2160 hour(s))  Basic metabolic panel     Status: Abnormal   Collection Time: 07/23/15  7:17 AM  Result Value Ref Range   Sodium 137 135 - 145 mmol/L   Potassium 3.8 3.5 - 5.1 mmol/L   Chloride 107 101 - 111 mmol/L   CO2 24 22 - 32 mmol/L   Glucose, Bld 166 (H) 65 - 99 mg/dL   BUN 20 6 - 20 mg/dL   Creatinine, Ser 0.97 0.61 - 1.24 mg/dL   Calcium 8.6 (L) 8.9 - 10.3 mg/dL   GFR calc non Af Amer >60 >60 mL/min   GFR calc Af Amer >60 >60 mL/min    Comment: (NOTE) The eGFR has been calculated using the CKD EPI  equation. This calculation has not been validated in all clinical situations. eGFR's persistently <60 mL/min signify possible Chronic Kidney Disease.    Anion gap 6 5 - 15  CBC     Status: None   Collection Time: 07/23/15  7:17 AM  Result Value Ref Range   WBC 7.6 3.8 - 10.6 K/uL   RBC 4.83 4.40 - 5.90 MIL/uL   Hemoglobin 15.1 13.0 - 18.0 g/dL   HCT 43.7 40.0 - 52.0 %   MCV 90.6 80.0 - 100.0 fL   MCH 31.3 26.0 - 34.0 pg   MCHC 34.5 32.0 - 36.0 g/dL   RDW 13.8 11.5 - 14.5 %   Platelets 160 150 - 440 K/uL  Troponin I     Status: None   Collection Time: 07/23/15  7:17 AM  Result Value Ref Range   Troponin I <0.03 <0.031 ng/mL    Comment:        NO INDICATION OF MYOCARDIAL INJURY.   TSH     Status: None   Collection Time: 07/23/15  7:17 AM  Result Value Ref Range   TSH 4.210 0.350 - 4.500 uIU/mL  Blood culture (routine x 2)     Status: None   Collection Time: 07/23/15  9:43 AM  Result Value Ref Range   Specimen Description BLOOD RIGHT ANTECUBITAL    Special Requests BOTTLES DRAWN AEROBIC AND ANAEROBIC  5CC    Culture NO GROWTH 5 DAYS    Report Status 07/28/2015 FINAL   Blood culture (routine x 2)     Status: None   Collection Time: 07/23/15  9:43 AM  Result  Value Ref Range   Specimen Description BLOOD LEFT HAND    Special Requests BOTTLES DRAWN AEROBIC AND ANAEROBIC  [place]    Culture NO GROWTH 5 DAYS    Report Status 07/28/2015 FINAL   Glucose, capillary     Status: Abnormal   Collection Time: 07/23/15 10:26 PM  Result Value Ref Range   Glucose-Capillary 378 (H) 65 - 99 mg/dL   Comment 1 Notify RN   Basic metabolic panel     Status: Abnormal   Collection Time: 07/24/15  5:08 AM  Result Value Ref Range   Sodium 139 135 - 145 mmol/L   Potassium 4.7 3.5 - 5.1 mmol/L   Chloride 109 101 - 111 mmol/L   CO2 26 22 - 32 mmol/L   Glucose, Bld 289 (H) 65 - 99 mg/dL   BUN 24 (H) 6 - 20 mg/dL   Creatinine, Ser 0.99 0.61 - 1.24 mg/dL   Calcium 8.9 8.9 - 10.3 mg/dL   GFR calc  non Af Amer >60 >60 mL/min   GFR calc Af Amer >60 >60 mL/min    Comment: (NOTE) The eGFR has been calculated using the CKD EPI equation. This calculation has not been validated in all clinical situations. eGFR's persistently <60 mL/min signify possible Chronic Kidney Disease.    Anion gap 4 (L) 5 - 15  Brain natriuretic peptide     Status: Abnormal   Collection Time: 07/24/15  5:09 AM  Result Value Ref Range   B Natriuretic Peptide 397.0 (H) 0.0 - 100.0 pg/mL  Glucose, capillary     Status: Abnormal   Collection Time: 07/24/15  7:32 AM  Result Value Ref Range   Glucose-Capillary 288 (H) 65 - 99 mg/dL  Glucose, capillary     Status: Abnormal   Collection Time: 07/24/15 11:43 AM  Result Value Ref Range   Glucose-Capillary 362 (H) 65 - 99 mg/dL  Glucose, capillary     Status: Abnormal   Collection Time: 07/24/15  4:54 PM  Result Value Ref Range   Glucose-Capillary 213 (H) 65 - 99 mg/dL  Glucose, capillary     Status: Abnormal   Collection Time: 07/24/15  8:54 PM  Result Value Ref Range   Glucose-Capillary 272 (H) 65 - 99 mg/dL  Glucose, capillary     Status: Abnormal   Collection Time: 07/25/15  7:24 AM  Result Value Ref Range   Glucose-Capillary 284 (H) 65 - 99 mg/dL  Glucose, capillary     Status: Abnormal   Collection Time: 07/25/15 12:25 PM  Result Value Ref Range   Glucose-Capillary 299 (H) 65 - 99 mg/dL  ECHO COMPLETE     Status: None   Collection Time: 07/25/15  2:15 PM  Result Value Ref Range   Weight 3508.8 oz   Height 71 in   BP 118/51 mmHg  Glucose, capillary     Status: Abnormal   Collection Time: 07/25/15  4:33 PM  Result Value Ref Range   Glucose-Capillary 301 (H) 65 - 99 mg/dL  Basic metabolic panel     Status: Abnormal   Collection Time: 07/25/15  5:35 PM  Result Value Ref Range   Sodium 133 (L) 135 - 145 mmol/L   Potassium 3.7 3.5 - 5.1 mmol/L   Chloride 101 101 - 111 mmol/L   CO2 20 (L) 22 - 32 mmol/L   Glucose, Bld 299 (H) 65 - 99 mg/dL   BUN 51  (H) 6 - 20 mg/dL   Creatinine, Ser 1.41 (H) 0.61 -  1.24 mg/dL   Calcium 9.0 8.9 - 10.3 mg/dL   GFR calc non Af Amer 50 (L) >60 mL/min   GFR calc Af Amer 58 (L) >60 mL/min    Comment: (NOTE) The eGFR has been calculated using the CKD EPI equation. This calculation has not been validated in all clinical situations. eGFR's persistently <60 mL/min signify possible Chronic Kidney Disease.    Anion gap 12 5 - 15  Magnesium     Status: None   Collection Time: 07/25/15  5:35 PM  Result Value Ref Range   Magnesium 1.9 1.7 - 2.4 mg/dL  Glucose, capillary     Status: Abnormal   Collection Time: 07/25/15  8:58 PM  Result Value Ref Range   Glucose-Capillary 237 (H) 65 - 99 mg/dL  Creatinine, serum     Status: None   Collection Time: 07/26/15  6:01 AM  Result Value Ref Range   Creatinine, Ser 1.11 0.61 - 1.24 mg/dL   GFR calc non Af Amer >60 >60 mL/min   GFR calc Af Amer >60 >60 mL/min    Comment: (NOTE) The eGFR has been calculated using the CKD EPI equation. This calculation has not been validated in all clinical situations. eGFR's persistently <60 mL/min signify possible Chronic Kidney Disease.   CBC     Status: Abnormal   Collection Time: 07/26/15  6:01 AM  Result Value Ref Range   WBC 19.6 (H) 3.8 - 10.6 K/uL   RBC 4.95 4.40 - 5.90 MIL/uL   Hemoglobin 15.4 13.0 - 18.0 g/dL   HCT 45.2 40.0 - 52.0 %   MCV 91.2 80.0 - 100.0 fL   MCH 31.0 26.0 - 34.0 pg   MCHC 34.0 32.0 - 36.0 g/dL   RDW 13.9 11.5 - 14.5 %   Platelets 200 150 - 440 K/uL  Glucose, capillary     Status: Abnormal   Collection Time: 07/26/15  7:37 AM  Result Value Ref Range   Glucose-Capillary 218 (H) 65 - 99 mg/dL   Comment 1 Notify RN   Glucose, capillary     Status: Abnormal   Collection Time: 07/26/15 11:30 AM  Result Value Ref Range   Glucose-Capillary 294 (H) 65 - 99 mg/dL   Comment 1 Notify RN   Glucose, capillary     Status: Abnormal   Collection Time: 07/26/15  4:25 PM  Result Value Ref Range    Glucose-Capillary 197 (H) 65 - 99 mg/dL   Comment 1 Notify RN   Glucose, capillary     Status: Abnormal   Collection Time: 07/26/15  9:01 PM  Result Value Ref Range   Glucose-Capillary 200 (H) 65 - 99 mg/dL  Glucose, capillary     Status: Abnormal   Collection Time: 07/27/15  7:34 AM  Result Value Ref Range   Glucose-Capillary 163 (H) 65 - 99 mg/dL   Comment 1 Notify RN   Glucose, capillary     Status: Abnormal   Collection Time: 07/27/15 11:16 AM  Result Value Ref Range   Glucose-Capillary 261 (H) 65 - 99 mg/dL   Comment 1 Notify RN   Glucose, capillary     Status: Abnormal   Collection Time: 07/27/15  4:33 PM  Result Value Ref Range   Glucose-Capillary 114 (H) 65 - 99 mg/dL   Comment 1 Notify RN   Glucose, capillary     Status: Abnormal   Collection Time: 07/27/15  8:37 PM  Result Value Ref Range   Glucose-Capillary 299 (H) 65 -  99 mg/dL  Glucose, capillary     Status: Abnormal   Collection Time: 07/28/15  7:38 AM  Result Value Ref Range   Glucose-Capillary 143 (H) 65 - 99 mg/dL   Comment 1 Notify RN   Basic metabolic panel     Status: Abnormal   Collection Time: 07/28/15 11:18 AM  Result Value Ref Range   Sodium 135 135 - 145 mmol/L   Potassium 4.9 3.5 - 5.1 mmol/L   Chloride 99 (L) 101 - 111 mmol/L   CO2 31 22 - 32 mmol/L   Glucose, Bld 243 (H) 65 - 99 mg/dL   BUN 52 (H) 6 - 20 mg/dL   Creatinine, Ser 1.46 (H) 0.61 - 1.24 mg/dL   Calcium 9.3 8.9 - 10.3 mg/dL   GFR calc non Af Amer 48 (L) >60 mL/min   GFR calc Af Amer 55 (L) >60 mL/min    Comment: (NOTE) The eGFR has been calculated using the CKD EPI equation. This calculation has not been validated in all clinical situations. eGFR's persistently <60 mL/min signify possible Chronic Kidney Disease.    Anion gap 5 5 - 15  Magnesium     Status: None   Collection Time: 07/28/15 11:18 AM  Result Value Ref Range   Magnesium 2.3 1.7 - 2.4 mg/dL  CBC     Status: Abnormal   Collection Time: 07/28/15 11:18 AM  Result  Value Ref Range   WBC 16.2 (H) 3.8 - 10.6 K/uL   RBC 5.41 4.40 - 5.90 MIL/uL   Hemoglobin 16.7 13.0 - 18.0 g/dL   HCT 48.9 40.0 - 52.0 %   MCV 90.5 80.0 - 100.0 fL   MCH 30.9 26.0 - 34.0 pg   MCHC 34.1 32.0 - 36.0 g/dL   RDW 13.8 11.5 - 14.5 %   Platelets 225 150 - 440 K/uL  Glucose, capillary     Status: Abnormal   Collection Time: 07/28/15 11:34 AM  Result Value Ref Range   Glucose-Capillary 282 (H) 65 - 99 mg/dL   Comment 1 Notify RN   Glucose, capillary     Status: Abnormal   Collection Time: 07/28/15  4:31 PM  Result Value Ref Range   Glucose-Capillary 177 (H) 65 - 99 mg/dL   Comment 1 Notify RN   Glucose, capillary     Status: Abnormal   Collection Time: 07/28/15  8:50 PM  Result Value Ref Range   Glucose-Capillary 239 (H) 65 - 99 mg/dL   Comment 1 Notify RN   Glucose, capillary     Status: Abnormal   Collection Time: 07/29/15  8:03 AM  Result Value Ref Range   Glucose-Capillary 146 (H) 65 - 99 mg/dL   Comment 1 Notify RN   Basic metabolic panel     Status: Abnormal   Collection Time: 07/29/15  9:36 AM  Result Value Ref Range   Sodium 134 (L) 135 - 145 mmol/L   Potassium 5.0 3.5 - 5.1 mmol/L   Chloride 98 (L) 101 - 111 mmol/L   CO2 29 22 - 32 mmol/L   Glucose, Bld 200 (H) 65 - 99 mg/dL   BUN 48 (H) 6 - 20 mg/dL   Creatinine, Ser 1.47 (H) 0.61 - 1.24 mg/dL   Calcium 9.2 8.9 - 10.3 mg/dL   GFR calc non Af Amer 47 (L) >60 mL/min   GFR calc Af Amer 55 (L) >60 mL/min    Comment: (NOTE) The eGFR has been calculated using the CKD EPI equation. This  calculation has not been validated in all clinical situations. eGFR's persistently <60 mL/min signify possible Chronic Kidney Disease.    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 DISCONTD: budesonide-formoterol (SYMBICORT) 160-4.5 MCG/ACT inhaler    Sig: Inhale into the lungs.  [name] esomeprazole (NEXIUM) 10 MG packet    Sig: Take by mouth.  . DISCONTD: losartan (COZAAR) 100 MG tablet    Sig: Take by mouth.  . DISCONTD: fluticasone (FLONASE) 50 MCG/ACT nasal spray    Sig:   . DISCONTD: metoprolol (LOPRESSOR) 50 MG tablet    Sig:   . predniSONE (DELTASONE) 10 MG tablet    Sig:   . ondansetron (ZOFRAN) 4 MG tablet    Sig: Take 1 tablet (4 mg total) by mouth every 8 (eight) hours as needed for nausea or vomiting.    Dispense:  20 tablet    Refill:  0   1. COPD with asthma ([place]) Cont. Inhaled meds  2. Bilateral pneumonia Finish Levaquin - DG Chest 2 View; Future  3. Nausea and vomiting, vomiting of unspecified type  - ondansetron (ZOFRAN) 4 MG tablet; Take 1 tablet (4 mg total) by mouth every 8 (eight) hours as needed for nausea or vomiting.  Dispense: 20 tablet; Refill: 0    -Cont all other meds as directed at hospital discharge.

## 2015-08-12 DIAGNOSIS — J310 Chronic rhinitis: Secondary | ICD-10-CM | POA: Diagnosis not present

## 2015-08-12 DIAGNOSIS — J439 Emphysema, unspecified: Secondary | ICD-10-CM | POA: Diagnosis not present

## 2015-08-12 DIAGNOSIS — R0609 Other forms of dyspnea: Secondary | ICD-10-CM | POA: Diagnosis not present

## 2015-08-17 ENCOUNTER — Encounter: Payer: Self-pay | Admitting: Family Medicine

## 2015-08-17 ENCOUNTER — Ambulatory Visit (INDEPENDENT_AMBULATORY_CARE_PROVIDER_SITE_OTHER): Payer: Medicare Other | Admitting: Family Medicine

## 2015-08-17 VITALS — BP 108/67 | HR 48 | Temp 97.9°F | Resp 16 | Ht 71.0 in | Wt 216.0 lb

## 2015-08-17 DIAGNOSIS — E1169 Type 2 diabetes mellitus with other specified complication: Secondary | ICD-10-CM | POA: Insufficient documentation

## 2015-08-17 DIAGNOSIS — I5030 Unspecified diastolic (congestive) heart failure: Secondary | ICD-10-CM | POA: Diagnosis not present

## 2015-08-17 DIAGNOSIS — E1142 Type 2 diabetes mellitus with diabetic polyneuropathy: Secondary | ICD-10-CM

## 2015-08-17 DIAGNOSIS — I481 Persistent atrial fibrillation: Secondary | ICD-10-CM

## 2015-08-17 DIAGNOSIS — I4819 Other persistent atrial fibrillation: Secondary | ICD-10-CM

## 2015-08-17 DIAGNOSIS — J431 Panlobular emphysema: Secondary | ICD-10-CM

## 2015-08-17 DIAGNOSIS — G629 Polyneuropathy, unspecified: Secondary | ICD-10-CM

## 2015-08-17 MED ORDER — GLUCOSE BLOOD VI STRP: 1.0000 | ORAL_STRIP | Freq: Two times a day (BID) | Status: DC

## 2015-08-17 MED ORDER — SITAGLIPTIN PHOSPHATE 100 MG PO TABS: 100.0000 mg | ORAL_TABLET | Freq: Every day | ORAL | Status: DC

## 2015-08-17 MED ORDER — GABAPENTIN 100 MG PO CAPS: 100.0000 mg | ORAL_CAPSULE | Freq: Three times a day (TID) | ORAL | Status: DC

## 2015-08-17 MED ORDER — METOPROLOL TARTRATE 75 MG PO TABS: 50.0000 mg | ORAL_TABLET | Freq: Two times a day (BID) | ORAL | Status: DC

## 2015-08-17 NOTE — Patient Instructions (Signed)
Use Lasix 40 mg, 2 daily until weight back to 205 range\

## 2015-08-17 NOTE — Progress Notes (Signed)
Name: Philip MCCRAVY Sr.   MRN: [id number]    DOB: 07-31-1945   Date:08/17/2015       Progress Note  Subjective  Chief Complaint  Chief Complaint  Patient presents with  . Pain    Bilateral; pt states it didn't start until his hospital visit    HPI Here following recent hospitalization for COPD and CHF.  BSs are elevated.  He has gained 11# of weight over past 10 days.  C/o bilateral feet pain.  Pain is on sole of foot.  Pain has been burning snd stinging and feet feel that he is walking on rolled up socks.  Tips of toes are tender.  No problem-specific assessment & plan notes found for this encounter.   Past Medical History  Diagnosis Date  . Diabetes mellitus without complication ([place])   . Thyroid disease   . Collapse of right lung     a. 12/1967 - ? etiology.  [name] History of bronchitis   . Type II diabetes mellitus ([place])   . Hypothyroidism   . Arthritis     a. right knee  . Hypercholesteremia   . Shingles   . Hypertension   . Coronary artery disease     a. 05/2012 Cath: severe 3VD-->CABG by Dr. Servando Snare in 05/2012 with LIMA to LAD, SVG to LCX & SVG to RPDA.  [name] Asthma   . COPD (chronic obstructive pulmonary disease) [facility])     Past Surgical History  Procedure Laterality Date  . Knee surgery  2003    right  . Neck surgery  2008    Plate in neck  . Knee arthroscopy  ~ 2000    "right" (05/06/2012)  . Anterior cervical decomp/discectomy fusion  ~ 2009  . Intraoperative transesophageal echocardiogram  05/07/2012    Procedure: INTRAOPERATIVE TRANSESOPHAGEAL ECHOCARDIOGRAM;  Surgeon: [name], MD;  Location: [place];  Service: Open Heart Surgery;  Laterality: N/A;  . Coronary artery bypass graft  05/07/2012    Procedure: CORONARY ARTERY BYPASS GRAFTING (CABG);  Surgeon: [name], MD;  Location: [place];  Service: Open Heart Surgery;  Laterality: N/A;  times three  . Cardiac catheterization  05/06/2012    Significant three-vessel coronary artery disease with normal  ejection fraction    Family History  Problem Relation Age of Onset  . Heart disease Mother   . Clotting disorder Mother   . Hypertension Sister     Social History   Social History  . Marital Status: Married    Spouse Name: N/A  . Number of Children: N/A  . Years of Education: N/A   Occupational History  . Not on file.   Social History Main Topics  . Smoking status: Former Smoker -- 1.00 packs/day for 8 years    Types: Cigarettes    Quit date: 12/31/1967  . Smokeless tobacco: Never Used  . Alcohol Use: No     Comment: 05/06/2012 "last alcohol several years ago; never had problem wit"  . Drug Use: No  . Sexual Activity: Not Currently   Other Topics Concern  . Not on file   Social History Narrative   ** Merged History Encounter **         Current outpatient prescriptions:  .  albuterol (PROVENTIL) (2.5 MG/3ML) 0.083% nebulizer solution, Take 2.5 mg by nebulization every 6 (six) hours as needed for wheezing or shortness of breath., Disp: , Rfl:  .  amiodarone (PACERONE) 200 MG tablet, Take 1 tablet (200 mg total)  by mouth 2 (two) times daily., Disp: 60 tablet, Rfl: 0 .  atorvastatin (LIPITOR) 40 MG tablet, TAKE ONE-HALF TABLET BY MOUTH AT BEDTIME, Disp: 30 tablet, Rfl: 6 .  ELIQUIS 5 MG TABS tablet, TAKE ONE TABLET BY MOUTH TWICE DAILY, Disp: 60 tablet, Rfl: 6 .  esomeprazole (NEXIUM) 10 MG packet, Take by mouth., Disp: , Rfl:  .  furosemide (LASIX) 40 MG tablet, Take 1 tablet (40 mg total) by mouth daily., Disp: 30 tablet, Rfl: 0 .  glucose blood (ONE TOUCH ULTRA TEST) test strip, 1 each by Other route 2 (two) times daily. Use as instructed to check bloods sugar twice a day., Disp: 100 each, Rfl: 12 .  glucose monitoring kit (FREESTYLE) monitoring kit, 1 each by Does not apply route as needed for other., Disp: 1 each, Rfl: 0 .  LANCETS ULTRA THIN MISC, 1 Device by Does not apply route 4 (four) times daily -  before meals and at bedtime., Disp: 200 each, Rfl: 5 .   levothyroxine (SYNTHROID, LEVOTHROID) 75 MCG tablet, TAKE ONE TABLET BY MOUTH ONCE DAILY, Disp: 30 tablet, Rfl: 6 .  metFORMIN (GLUCOPHAGE) 500 MG tablet, Take 2 tablets by mouth 2 (two) times daily., Disp: , Rfl:  .  Metoprolol Tartrate 75 MG TABS, Take 50 mg by mouth 2 (two) times daily. Takle 1, 50 mg tablet twice a day, Disp: 60 tablet, Rfl: 6 .  montelukast (SINGULAIR) 10 MG tablet, Take 10 mg by mouth at bedtime. , Disp: , Rfl:  .  ondansetron (ZOFRAN) 4 MG tablet, Take 1 tablet (4 mg total) by mouth every 8 (eight) hours as needed for nausea or vomiting., Disp: 20 tablet, Rfl: 0 .  PROAIR HFA 108 (90 BASE) MCG/ACT inhaler, INHALE TWO PUFFS BY MOUTH 4 TIMES DAILY AS NEEDED, Disp: 9 each, Rfl: 3 .  tiotropium (SPIRIVA) 18 MCG inhalation capsule, Place 18 mcg into inhaler and inhale daily. , Disp: , Rfl:  .  gabapentin (NEURONTIN) 100 MG capsule, Take 1 capsule (100 mg total) by mouth 3 (three) times daily. Take 3 capsules three times a day., Disp: 270 capsule, Rfl: 6 .  levofloxacin (LEVAQUIN) 500 MG tablet, Take 1 tablet (500 mg total) by mouth daily. For 7 days (Patient not taking: Reported on 08/17/2015), Disp: 7 tablet, Rfl: 0 .  predniSONE (DELTASONE) 10 MG tablet, Reported on 08/17/2015, Disp: , Rfl:  .  sitaGLIPtin (JANUVIA) 100 MG tablet, Take 1 tablet (100 mg total) by mouth daily., Disp: 30 tablet, Rfl: 6  Allergies  Allergen Reactions  . Iodine Rash  . Penicillins Hives, Itching, Rash and Other (See Comments)    "broke out in welps" (05/06/2012)  . Sulfa Antibiotics Shortness Of Breath  . Iodine     Rash and itching  . Ivp Dye [Iodinated Diagnostic Agents]     Rash;redness  . Penicillins     Hives  . Sulfa Antibiotics     Difficulty Breathing     Review of Systems  Constitutional: Negative for fever, chills, weight loss and malaise/fatigue.  HENT: Negative for hearing loss.   Eyes: Positive for blurred vision. Negative for double vision.  Respiratory: Negative for cough,  shortness of breath and wheezing.   Cardiovascular: Negative for chest pain, palpitations and leg swelling.  Gastrointestinal: Negative for heartburn, abdominal pain and blood in stool.  Genitourinary: Negative for dysuria, urgency and frequency.  Skin: Negative for rash.  Neurological: Positive for tingling. Negative for weakness and headaches.  Tingling, burning feet and toes.      Objective  Filed Vitals:   08/17/15 1457 08/17/15 1538  BP: 108/67   Pulse: 43 48  Temp: 97.9 F (36.6 C)   TempSrc: Oral   Resp: 16   Height: 5' 11" (1.803 m)   Weight: 216 lb (97.977 kg)     Physical Exam  Constitutional: He is oriented to person, place, and time and well-developed, well-nourished, and in no distress. No distress.  HENT:  Head: Normocephalic.  Eyes: Conjunctivae and EOM are normal. Pupils are equal, round, and reactive to light. No scleral icterus.  Neck: Normal range of motion. Neck supple. Carotid bruit is not present. No thyromegaly present.  Cardiovascular: Normal heart sounds.  An irregularly irregular rhythm present. Bradycardia present.  Exam reveals no gallop and no friction rub.   No murmur heard. Pulmonary/Chest: Breath sounds normal. No respiratory distress. He has no wheezes. He has no rales.  Abdominal: Soft. Bowel sounds are normal. He exhibits no distension and no mass. There is no tenderness.  Musculoskeletal: He exhibits edema (trace bilateral pedal edema.).  Lymphadenopathy:    He has no cervical adenopathy.  Neurological: He is alert and oriented to person, place, and time.  Decreased sensation of both feet.  Vitals reviewed.      Recent Results (from the past 2160 hour(s))  Basic metabolic panel     Status: Abnormal   Collection Time: 07/23/15  7:17 AM  Result Value Ref Range   Sodium 137 135 - 145 mmol/L   Potassium 3.8 3.5 - 5.1 mmol/L   Chloride 107 101 - 111 mmol/L   CO2 24 22 - 32 mmol/L   Glucose, Bld 166 (H) 65 - 99 mg/dL   BUN 20 6  - 20 mg/dL   Creatinine, Ser 0.97 0.61 - 1.24 mg/dL   Calcium 8.6 (L) 8.9 - 10.3 mg/dL   GFR calc non Af Amer >60 >60 mL/min   GFR calc Af Amer >60 >60 mL/min    Comment: (NOTE) The eGFR has been calculated using the CKD EPI equation. This calculation has not been validated in all clinical situations. eGFR's persistently <60 mL/min signify possible Chronic Kidney Disease.    Anion gap 6 5 - 15  CBC     Status: None   Collection Time: 07/23/15  7:17 AM  Result Value Ref Range   WBC 7.6 3.8 - 10.6 K/uL   RBC 4.83 4.40 - 5.90 MIL/uL   Hemoglobin 15.1 13.0 - 18.0 g/dL   HCT 43.7 40.0 - 52.0 %   MCV 90.6 80.0 - 100.0 fL   MCH 31.3 26.0 - 34.0 pg   MCHC 34.5 32.0 - 36.0 g/dL   RDW 13.8 11.5 - 14.5 %   Platelets 160 150 - 440 K/uL  Troponin I     Status: None   Collection Time: 07/23/15  7:17 AM  Result Value Ref Range   Troponin I <0.03 <0.031 ng/mL    Comment:        NO INDICATION OF MYOCARDIAL INJURY.   TSH     Status: None   Collection Time: 07/23/15  7:17 AM  Result Value Ref Range   TSH 4.210 0.350 - 4.500 uIU/mL  Blood culture (routine x 2)     Status: None   Collection Time: 07/23/15  9:43 AM  Result Value Ref Range   Specimen Description BLOOD RIGHT ANTECUBITAL    Special Requests BOTTLES DRAWN AEROBIC AND ANAEROBIC  5CC  Culture NO GROWTH 5 DAYS    Report Status 07/28/2015 FINAL   Blood culture (routine x 2)     Status: None   Collection Time: 07/23/15  9:43 AM  Result Value Ref Range   Specimen Description BLOOD LEFT HAND    Special Requests BOTTLES DRAWN AEROBIC AND ANAEROBIC  [place]    Culture NO GROWTH 5 DAYS    Report Status 07/28/2015 FINAL   Glucose, capillary     Status: Abnormal   Collection Time: 07/23/15 10:26 PM  Result Value Ref Range   Glucose-Capillary 378 (H) 65 - 99 mg/dL   Comment 1 Notify RN   Basic metabolic panel     Status: Abnormal   Collection Time: 07/24/15  5:08 AM  Result Value Ref Range   Sodium 139 135 - 145 mmol/L   Potassium  4.7 3.5 - 5.1 mmol/L   Chloride 109 101 - 111 mmol/L   CO2 26 22 - 32 mmol/L   Glucose, Bld 289 (H) 65 - 99 mg/dL   BUN 24 (H) 6 - 20 mg/dL   Creatinine, Ser 0.99 0.61 - 1.24 mg/dL   Calcium 8.9 8.9 - 10.3 mg/dL   GFR calc non Af Amer >60 >60 mL/min   GFR calc Af Amer >60 >60 mL/min    Comment: (NOTE) The eGFR has been calculated using the CKD EPI equation. This calculation has not been validated in all clinical situations. eGFR's persistently <60 mL/min signify possible Chronic Kidney Disease.    Anion gap 4 (L) 5 - 15  Brain natriuretic peptide     Status: Abnormal   Collection Time: 07/24/15  5:09 AM  Result Value Ref Range   B Natriuretic Peptide 397.0 (H) 0.0 - 100.0 pg/mL  Glucose, capillary     Status: Abnormal   Collection Time: 07/24/15  7:32 AM  Result Value Ref Range   Glucose-Capillary 288 (H) 65 - 99 mg/dL  Glucose, capillary     Status: Abnormal   Collection Time: 07/24/15 11:43 AM  Result Value Ref Range   Glucose-Capillary 362 (H) 65 - 99 mg/dL  Glucose, capillary     Status: Abnormal   Collection Time: 07/24/15  4:54 PM  Result Value Ref Range   Glucose-Capillary 213 (H) 65 - 99 mg/dL  Glucose, capillary     Status: Abnormal   Collection Time: 07/24/15  8:54 PM  Result Value Ref Range   Glucose-Capillary 272 (H) 65 - 99 mg/dL  Glucose, capillary     Status: Abnormal   Collection Time: 07/25/15  7:24 AM  Result Value Ref Range   Glucose-Capillary 284 (H) 65 - 99 mg/dL  Glucose, capillary     Status: Abnormal   Collection Time: 07/25/15 12:25 PM  Result Value Ref Range   Glucose-Capillary 299 (H) 65 - 99 mg/dL  ECHO COMPLETE     Status: None   Collection Time: 07/25/15  2:15 PM  Result Value Ref Range   Weight 3508.8 oz   Height 71 in   BP 118/51 mmHg  Glucose, capillary     Status: Abnormal   Collection Time: 07/25/15  4:33 PM  Result Value Ref Range   Glucose-Capillary 301 (H) 65 - 99 mg/dL  Basic metabolic panel     Status: Abnormal    Collection Time: 07/25/15  5:35 PM  Result Value Ref Range   Sodium 133 (L) 135 - 145 mmol/L   Potassium 3.7 3.5 - 5.1 mmol/L   Chloride 101 101 - 111 mmol/L  CO2 20 (L) 22 - 32 mmol/L   Glucose, Bld 299 (H) 65 - 99 mg/dL   BUN 51 (H) 6 - 20 mg/dL   Creatinine, Ser 1.41 (H) 0.61 - 1.24 mg/dL   Calcium 9.0 8.9 - 10.3 mg/dL   GFR calc non Af Amer 50 (L) >60 mL/min   GFR calc Af Amer 58 (L) >60 mL/min    Comment: (NOTE) The eGFR has been calculated using the CKD EPI equation. This calculation has not been validated in all clinical situations. eGFR's persistently <60 mL/min signify possible Chronic Kidney Disease.    Anion gap 12 5 - 15  Magnesium     Status: None   Collection Time: 07/25/15  5:35 PM  Result Value Ref Range   Magnesium 1.9 1.7 - 2.4 mg/dL  Glucose, capillary     Status: Abnormal   Collection Time: 07/25/15  8:58 PM  Result Value Ref Range   Glucose-Capillary 237 (H) 65 - 99 mg/dL  Creatinine, serum     Status: None   Collection Time: 07/26/15  6:01 AM  Result Value Ref Range   Creatinine, Ser 1.11 0.61 - 1.24 mg/dL   GFR calc non Af Amer >60 >60 mL/min   GFR calc Af Amer >60 >60 mL/min    Comment: (NOTE) The eGFR has been calculated using the CKD EPI equation. This calculation has not been validated in all clinical situations. eGFR's persistently <60 mL/min signify possible Chronic Kidney Disease.   CBC     Status: Abnormal   Collection Time: 07/26/15  6:01 AM  Result Value Ref Range   WBC 19.6 (H) 3.8 - 10.6 K/uL   RBC 4.95 4.40 - 5.90 MIL/uL   Hemoglobin 15.4 13.0 - 18.0 g/dL   HCT 45.2 40.0 - 52.0 %   MCV 91.2 80.0 - 100.0 fL   MCH 31.0 26.0 - 34.0 pg   MCHC 34.0 32.0 - 36.0 g/dL   RDW 13.9 11.5 - 14.5 %   Platelets 200 150 - 440 K/uL  Glucose, capillary     Status: Abnormal   Collection Time: 07/26/15  7:37 AM  Result Value Ref Range   Glucose-Capillary 218 (H) 65 - 99 mg/dL   Comment 1 Notify RN   Glucose, capillary     Status: Abnormal    Collection Time: 07/26/15 11:30 AM  Result Value Ref Range   Glucose-Capillary 294 (H) 65 - 99 mg/dL   Comment 1 Notify RN   Glucose, capillary     Status: Abnormal   Collection Time: 07/26/15  4:25 PM  Result Value Ref Range   Glucose-Capillary 197 (H) 65 - 99 mg/dL   Comment 1 Notify RN   Glucose, capillary     Status: Abnormal   Collection Time: 07/26/15  9:01 PM  Result Value Ref Range   Glucose-Capillary 200 (H) 65 - 99 mg/dL  Glucose, capillary     Status: Abnormal   Collection Time: 07/27/15  7:34 AM  Result Value Ref Range   Glucose-Capillary 163 (H) 65 - 99 mg/dL   Comment 1 Notify RN   Glucose, capillary     Status: Abnormal   Collection Time: 07/27/15 11:16 AM  Result Value Ref Range   Glucose-Capillary 261 (H) 65 - 99 mg/dL   Comment 1 Notify RN   Glucose, capillary     Status: Abnormal   Collection Time: 07/27/15  4:33 PM  Result Value Ref Range   Glucose-Capillary 114 (H) 65 - 99 mg/dL  Comment 1 Notify RN   Glucose, capillary     Status: Abnormal   Collection Time: 07/27/15  8:37 PM  Result Value Ref Range   Glucose-Capillary 299 (H) 65 - 99 mg/dL  Glucose, capillary     Status: Abnormal   Collection Time: 07/28/15  7:38 AM  Result Value Ref Range   Glucose-Capillary 143 (H) 65 - 99 mg/dL   Comment 1 Notify RN   Basic metabolic panel     Status: Abnormal   Collection Time: 07/28/15 11:18 AM  Result Value Ref Range   Sodium 135 135 - 145 mmol/L   Potassium 4.9 3.5 - 5.1 mmol/L   Chloride 99 (L) 101 - 111 mmol/L   CO2 31 22 - 32 mmol/L   Glucose, Bld 243 (H) 65 - 99 mg/dL   BUN 52 (H) 6 - 20 mg/dL   Creatinine, Ser 1.46 (H) 0.61 - 1.24 mg/dL   Calcium 9.3 8.9 - 10.3 mg/dL   GFR calc non Af Amer 48 (L) >60 mL/min   GFR calc Af Amer 55 (L) >60 mL/min    Comment: (NOTE) The eGFR has been calculated using the CKD EPI equation. This calculation has not been validated in all clinical situations. eGFR's persistently <60 mL/min signify possible Chronic  Kidney Disease.    Anion gap 5 5 - 15  Magnesium     Status: None   Collection Time: 07/28/15 11:18 AM  Result Value Ref Range   Magnesium 2.3 1.7 - 2.4 mg/dL  CBC     Status: Abnormal   Collection Time: 07/28/15 11:18 AM  Result Value Ref Range   WBC 16.2 (H) 3.8 - 10.6 K/uL   RBC 5.41 4.40 - 5.90 MIL/uL   Hemoglobin 16.7 13.0 - 18.0 g/dL   HCT 48.9 40.0 - 52.0 %   MCV 90.5 80.0 - 100.0 fL   MCH 30.9 26.0 - 34.0 pg   MCHC 34.1 32.0 - 36.0 g/dL   RDW 13.8 11.5 - 14.5 %   Platelets 225 150 - 440 K/uL  Glucose, capillary     Status: Abnormal   Collection Time: 07/28/15 11:34 AM  Result Value Ref Range   Glucose-Capillary 282 (H) 65 - 99 mg/dL   Comment 1 Notify RN   Glucose, capillary     Status: Abnormal   Collection Time: 07/28/15  4:31 PM  Result Value Ref Range   Glucose-Capillary 177 (H) 65 - 99 mg/dL   Comment 1 Notify RN   Glucose, capillary     Status: Abnormal   Collection Time: 07/28/15  8:50 PM  Result Value Ref Range   Glucose-Capillary 239 (H) 65 - 99 mg/dL   Comment 1 Notify RN   Glucose, capillary     Status: Abnormal   Collection Time: 07/29/15  8:03 AM  Result Value Ref Range   Glucose-Capillary 146 (H) 65 - 99 mg/dL   Comment 1 Notify RN   Basic metabolic panel     Status: Abnormal   Collection Time: 07/29/15  9:36 AM  Result Value Ref Range   Sodium 134 (L) 135 - 145 mmol/L   Potassium 5.0 3.5 - 5.1 mmol/L   Chloride 98 (L) 101 - 111 mmol/L   CO2 29 22 - 32 mmol/L   Glucose, Bld 200 (H) 65 - 99 mg/dL   BUN 48 (H) 6 - 20 mg/dL   Creatinine, Ser 1.47 (H) 0.61 - 1.24 mg/dL   Calcium 9.2 8.9 - 10.3 mg/dL   GFR  calc non Af Amer 47 (L) >60 mL/min   GFR calc Af Amer 55 (L) >60 mL/min    Comment: (NOTE) The eGFR has been calculated using the CKD EPI equation. This calculation has not been validated in all clinical situations. eGFR's persistently <60 mL/min signify possible Chronic Kidney Disease.    Anion gap 7 5 - 15  Glucose, capillary      Status: Abnormal   Collection Time: 07/29/15 11:49 AM  Result Value Ref Range   Glucose-Capillary 158 (H) 65 - 99 mg/dL   Comment 1 Notify RN      Assessment & Plan  Problem List Items Addressed This Visit      Cardiovascular and Mediastinum   Atrial fibrillation (HCC)   Relevant Medications   Metoprolol Tartrate 75 MG TABS   (HFpEF) heart failure with preserved ejection fraction (HCC)   Relevant Medications   Metoprolol Tartrate 75 MG TABS     Respiratory   COPD (chronic obstructive pulmonary disease) (HCC)     Endocrine   Type 2 diabetes mellitus with peripheral neuropathy (HCC)   Relevant Medications   sitaGLIPtin (JANUVIA) 100 MG tablet   glucose blood (ONE TOUCH ULTRA TEST) test strip   gabapentin (NEURONTIN) 100 MG capsule     Nervous and Auditory   Peripheral neuropathy (HCC) - Primary   Relevant Medications   gabapentin (NEURONTIN) 100 MG capsule      Meds ordered this encounter  Medications  . sitaGLIPtin (JANUVIA) 100 MG tablet    Sig: Take 1 tablet (100 mg total) by mouth daily.    Dispense:  30 tablet    Refill:  6  . Metoprolol Tartrate 75 MG TABS    Sig: Take 50 mg by mouth 2 (two) times daily. Takle 1, 50 mg tablet twice a day    Dispense:  60 tablet    Refill:  6  . glucose blood (ONE TOUCH ULTRA TEST) test strip    Sig: 1 each by Other route 2 (two) times daily. Use as instructed to check bloods sugar twice a day.    Dispense:  100 each    Refill:  12  . gabapentin (NEURONTIN) 100 MG capsule    Sig: Take 1 capsule (100 mg total) by mouth 3 (three) times daily. Take 3 capsules three times a day.    Dispense:  270 capsule    Refill:  6   1. Peripheral polyneuropathy (HCC)  - gabapentin (NEURONTIN) 100 MG capsule; Take 1 capsule (100 mg total) by mouth 3 (three) times daily. Take 3 capsules three times a day.  Dispense: 270 capsule; Refill: 6  2. Type 2 diabetes mellitus with peripheral neuropathy (HCC)  - sitaGLIPtin (JANUVIA) 100 MG  tablet; Take 1 tablet (100 mg total) by mouth daily.  Dispense: 30 tablet; Refill: 6 - glucose blood (ONE TOUCH ULTRA TEST) test strip; 1 each by Other route 2 (two) times daily. Use as instructed to check bloods sugar twice a day.  Dispense: 100 each; Refill: 12  3. Panlobular emphysema ([place])   4. (HFpEF) heart failure with preserved ejection fraction (HCC)  - Metoprolol Tartrate 75 MG TABS; Take 50 mg by mouth 2 (two) times daily. Takle 1, 50 mg tablet twice a day  Dispense: 60 tablet; Refill: 6  5. Persistent atrial fibrillation (HCC)  Continue other current meds.

## 2015-08-23 ENCOUNTER — Other Ambulatory Visit: Payer: Self-pay

## 2015-08-23 MED ORDER — FUROSEMIDE 40 MG PO TABS: 40.0000 mg | ORAL_TABLET | Freq: Every day | ORAL | Status: DC

## 2015-08-23 NOTE — Telephone Encounter (Signed)
Refill sent for Lasix.  

## 2015-08-23 NOTE — Telephone Encounter (Signed)
Refill sent for Lasix 40 mg.

## 2015-08-24 ENCOUNTER — Ambulatory Visit (INDEPENDENT_AMBULATORY_CARE_PROVIDER_SITE_OTHER): Payer: Medicare Other

## 2015-08-24 DIAGNOSIS — I471 Supraventricular tachycardia: Secondary | ICD-10-CM | POA: Diagnosis not present

## 2015-08-26 ENCOUNTER — Other Ambulatory Visit: Payer: Self-pay

## 2015-08-26 ENCOUNTER — Telehealth: Payer: Self-pay | Admitting: Cardiovascular Disease

## 2015-08-26 MED ORDER — AMIODARONE HCL 200 MG PO TABS: 200.0000 mg | ORAL_TABLET | Freq: Two times a day (BID) | ORAL | Status: DC

## 2015-08-26 NOTE — Telephone Encounter (Signed)
Patient wants to know if he can drive now.    Patient is out of amiodarone 200 mg po 2x daily and wants to know if Fletcher Anon is going to continue this med before he refills.   If continued. . . .  *STAT* If patient is at the pharmacy, call can be transferred to refill team.   1. Which medications need to be refilled? (please list name of each medication and dose if known)  amiodarone 200 mg po 2x daily  2. Which pharmacy/location (including street and city if local pharmacy) is medication to be sent to? [facility] [address] [place]   3. Do they need a 30 day or 90 day supply? [place]

## 2015-08-26 NOTE — Telephone Encounter (Signed)
S/w pt who called to inquire of amiodarone refill and HM results. Notes from 4/3 OV w/Dr. Fletcher Anon:  "Atrial tachycardia with pauses: He reports intermittent heart rate in the 30s at home with dizziness. He was recently started on amiodarone with the hope of suppressing his atrial arrhythmia. I requested a 48-hour Holter monitor."  Pt returned monitor today. Awaiting results and review from MD.  Advised pt to continue all meds as prescribed until further advice from MD. I have submitted amiodarone refill. Pt asks if he can drive as he states he was advised at hospital d/c to not drive "while I am taking new medications" Advised pt to continue to abide by all instructions until instructed otherwise.  Pt verbalized understanding with no further questions.

## 2015-08-26 NOTE — Telephone Encounter (Signed)
Left message on machine for patient to contact the office.   

## 2015-08-30 ENCOUNTER — Telehealth: Payer: Self-pay | Admitting: Cardiovascular Disease

## 2015-08-30 ENCOUNTER — Ambulatory Visit: Payer: Medicare Other | Attending: Cardiovascular Disease

## 2015-08-30 DIAGNOSIS — I471 Supraventricular tachycardia: Secondary | ICD-10-CM | POA: Insufficient documentation

## 2015-08-30 NOTE — Telephone Encounter (Signed)
S/w Santiago Glad in cardiopulmonary regarding HM results. Santiago Glad states she will have it scanned today. She is aware pt awaiting results

## 2015-08-30 NOTE — Telephone Encounter (Signed)
Pt would like monitor results. Please call. 

## 2015-09-01 ENCOUNTER — Other Ambulatory Visit: Payer: Self-pay

## 2015-09-01 MED ORDER — AMIODARONE HCL 200 MG PO TABS: 200.0000 mg | ORAL_TABLET | Freq: Every day | ORAL | Status: DC

## 2015-09-01 NOTE — Telephone Encounter (Signed)
Reviewed HM results w/pt. See result note

## 2015-09-02 ENCOUNTER — Ambulatory Visit (INDEPENDENT_AMBULATORY_CARE_PROVIDER_SITE_OTHER): Payer: Medicare Other | Admitting: Cardiovascular Disease

## 2015-09-02 ENCOUNTER — Encounter: Payer: Self-pay | Admitting: Cardiovascular Disease

## 2015-09-02 VITALS — BP 90/67 | HR 94 | Ht 71.0 in | Wt 224.0 lb

## 2015-09-02 DIAGNOSIS — I10 Essential (primary) hypertension: Secondary | ICD-10-CM | POA: Diagnosis not present

## 2015-09-02 DIAGNOSIS — I471 Supraventricular tachycardia: Secondary | ICD-10-CM

## 2015-09-02 DIAGNOSIS — I481 Persistent atrial fibrillation: Secondary | ICD-10-CM

## 2015-09-02 DIAGNOSIS — I4819 Other persistent atrial fibrillation: Secondary | ICD-10-CM

## 2015-09-02 NOTE — Progress Notes (Signed)
Cardiology Office Note   Date:  09/02/2015   ID:  Philip SCRIMA Sr., DOB [birth year], MRN [id number]  PCP:  [name], MD  Cardiologist:   [name], MD   Chief Complaint  Patient presents with  . other    1 month follow up and discuss holter monitor. Meds reviewed by the patient verbally. "doing well."       History of Present Illness: Philip [name]. is a 70 y.o. male who presents for a follow-up visit regarding Coronary artery disease, paroxysmal atrial fibrillation and atrial tachycardia.   He is status post CABG in January of 2014.  He had postoperative atrial fibrillation which was treated with amiodarone.    He is known to have frequent PACs and atrial tachycardia with pauses.  Echocardiogram in May 2015 showed low normal LV systolic function with ejection fraction of 50-55% with mild mitral regurgitation. In August 2016 he had suspected atrial fibrillation on his EKG and thus he was started on Eliquis. He was hospitalized in March for shortness of breath, cough, congestion and palpitations.  He was seen by Dr. Caryl Comes who started him on amiodarone to suppress his atrial arrhythmia. EKG showed recurrent atrial tachycardia. He had an echocardiogram done while hospitalized which showed stable LV systolic function with an ejection fraction of 50-55%.  During last visit, I prescribed Levaquin due to persistent productive cough and abnormal lung examination. He had a Holter monitor done which showed frequent PACs and short runs of atrial tachycardia. He has been doing significantly better this last month with gradual improvement in symptoms. No chest pain. No dizziness or syncope. He did notice increased leg edema recently requiring additional doses of furosemide.    Past Medical History  Diagnosis Date  . Diabetes mellitus without complication (Philip [name])   . Thyroid disease   . Collapse of right lung     a. 12/1967 - ? etiology.  [name] History of bronchitis   . Type II  diabetes mellitus (Philip [name])   . Hypothyroidism   . Arthritis     a. right knee  . Hypercholesteremia   . Shingles   . Hypertension   . Coronary artery disease     a. 05/2012 Cath: severe 3VD-->CABG by Dr. Servando Snare in 05/2012 with LIMA to LAD, SVG to LCX & SVG to RPDA.  [name] Asthma   . COPD (chronic obstructive pulmonary disease) [facility])     Past Surgical History  Procedure Laterality Date  . Knee surgery  2003    right  . Neck surgery  2008    Plate in neck  . Knee arthroscopy  ~ 2000    "right" (05/06/2012)  . Anterior cervical decomp/discectomy fusion  ~ 2009  . Intraoperative transesophageal echocardiogram  05/07/2012    Procedure: INTRAOPERATIVE TRANSESOPHAGEAL ECHOCARDIOGRAM;  Surgeon: [name], MD;  Location: [place];  Service: Open Heart Surgery;  Laterality: N/A;  . Coronary artery bypass graft  05/07/2012    Procedure: CORONARY ARTERY BYPASS GRAFTING (CABG);  Surgeon: [name], MD;  Location: [place];  Service: Open Heart Surgery;  Laterality: N/A;  times three  . Cardiac catheterization  05/06/2012    Significant three-vessel coronary artery disease with normal ejection fraction     Current Outpatient Prescriptions  Medication Sig Dispense Refill  . albuterol (PROVENTIL) (2.5 MG/3ML) 0.083% nebulizer solution Take 2.5 mg by nebulization every 6 (six) hours as needed for wheezing or shortness of breath.    [name] amiodarone (  PACERONE) 200 MG tablet Take 1 tablet (200 mg total) by mouth daily. 30 tablet 5  . atorvastatin (LIPITOR) 40 MG tablet TAKE ONE-HALF TABLET BY MOUTH AT BEDTIME 30 tablet 6  . ELIQUIS 5 MG TABS tablet TAKE ONE TABLET BY MOUTH TWICE DAILY 60 tablet 6  . esomeprazole (NEXIUM) 10 MG packet Take 10 mg by mouth daily before breakfast.     . furosemide (LASIX) 40 MG tablet Take 1 tablet (40 mg total) by mouth daily. 30 tablet 6  . gabapentin (NEURONTIN) 100 MG capsule Take 1 capsule (100 mg total) by mouth 3 (three) times daily. Take 3 capsules three times a day.  270 capsule 6  . glucose blood (ONE TOUCH ULTRA TEST) test strip 1 each by Other route 2 (two) times daily. Use as instructed to check bloods sugar twice a day. 100 each 12  . glucose monitoring kit (FREESTYLE) monitoring kit 1 each by Does not apply route as needed for other. 1 each 0  . LANCETS ULTRA THIN MISC 1 Device by Does not apply route 4 (four) times daily -  before meals and at bedtime. 200 each 5  . levothyroxine (SYNTHROID, LEVOTHROID) 75 MCG tablet TAKE ONE TABLET BY MOUTH ONCE DAILY 30 tablet 6  . metFORMIN (GLUCOPHAGE) 500 MG tablet Take 2 tablets by mouth 2 (two) times daily.    . metoprolol (LOPRESSOR) 50 MG tablet Take 50 mg by mouth 2 (two) times daily.     . montelukast (SINGULAIR) 10 MG tablet Take 10 mg by mouth at bedtime.     . ondansetron (ZOFRAN) 4 MG tablet Take 1 tablet (4 mg total) by mouth every 8 (eight) hours as needed for nausea or vomiting. 20 tablet 0  . PROAIR HFA 108 (90 BASE) MCG/ACT inhaler INHALE TWO PUFFS BY MOUTH 4 TIMES DAILY AS NEEDED 9 each 3  . sitaGLIPtin (JANUVIA) 100 MG tablet Take 1 tablet (100 mg total) by mouth daily. 30 tablet 6  . tiotropium (SPIRIVA) 18 MCG inhalation capsule Place 18 mcg into inhaler and inhale daily.      No current facility-administered medications for this visit.    Allergies:   Iodine; Penicillins; Sulfa antibiotics; Iodine; Ivp dye; Penicillins; and Sulfa antibiotics    Social History:  The patient  reports that he quit smoking about 47 years ago. His smoking use included Cigarettes. He has a 8 pack-year smoking history. He has never used smokeless tobacco. He reports that he does not drink alcohol or use illicit drugs.   Family History:  The patient's family history includes Clotting disorder in his mother; Heart disease in his mother; Hypertension in his sister.    ROS:  Please see the history of present illness.   Otherwise, review of systems are positive for none.   All other systems are reviewed and negative.      PHYSICAL EXAM: VS:  BP 90/67 mmHg  Pulse 94  Ht 5' 11"  (1.803 m)  Wt 224 lb (101.606 kg)  BMI 31.26 kg/m2 , BMI Body mass index is 31.26 kg/(m^2). GEN: Well nourished, well developed, in no acute distress HEENT: normal Neck: no JVD, carotid bruits, or masses Cardiac: RRR; no murmurs, rubs, or gallops, +1 edema Respiratory: Lungs are clear, normal work of breathing GI: soft, nontender, nondistended, + BS MS: no deformity or atrophy Skin: warm and dry, no rash Neuro:  Strength and sensation are intact Psych: euthymic mood, full affect   EKG:  EKG is ordered today. The ekg  ordered today demonstrates normal sinus rhythm with prolonged QT interval.  Recent Labs: 12/14/2014: ALT 26 07/23/2015: TSH 4.210 07/24/2015: B Natriuretic Peptide 397.0* 07/28/2015: Hemoglobin 16.7; Magnesium 2.3; Platelets 225 07/29/2015: BUN 48*; Creatinine, Ser 1.47*; Potassium 5.0; Sodium 134*    Lipid Panel    Component Value Date/Time   CHOL 139 12/14/2014 0834   TRIG 127 12/14/2014 0834   HDL 43 12/14/2014 0834   CHOLHDL 3.2 12/14/2014 0834   LDLCALC 71 12/14/2014 0834      Wt Readings from Last 3 Encounters:  09/02/15 224 lb (101.606 kg)  08/17/15 216 lb (97.977 kg)  08/05/15 205 lb (92.987 kg)        ASSESSMENT AND PLAN:   1. Paroxysmal atrial fibrillation: He is currently on anticoagulation with Eliquis.EKG shows normal sinus rhythm.  2. Atrial tachycardia with pauses:  This was very frequent and currently being treated with amiodarone. I decreased the dose of amiodarone recently to 200 mg once daily. QT interval is still prolonged and this has to be monitored. The prolongation is probably due to amiodarone and also the recent treatment with Levaquin. The patient never had syncope or presyncope. He will require routine labs in the next 3 months given that amiodarone was started recently.  3. Bilateral leg edema: Likely chronic venous insufficiency with some worsening recently after  steroid use. I advised him to elevate his legs during the day and use support stockings.  4. Coronary artery disease involving native coronary arteries without angina: He has no anginal symptoms.   5. Essential hypertension: Blood pressure is well controlled.  6. Hyperlipidemia: Continue treatment with atorvastatin. Most recent LDL was 71.   Disposition:   FU with me in 3 months  Signed,  [name], MD  09/02/2015 3:46 PM    [place]

## 2015-09-02 NOTE — Patient Instructions (Signed)
Medication Instructions: Continue same medications.   Labwork: None.   Procedures/Testing: None.   Follow-Up: 3 months with Dr. Fletcher Anon.   Any Additional Special Instructions Will Be Listed Below (If Applicable).  You can resume work with no restrictions.    If you need a refill on your cardiac medications before your next appointment, please call your pharmacy.

## 2015-09-03 ENCOUNTER — Ambulatory Visit: Payer: Self-pay | Admitting: Cardiovascular Disease

## 2015-09-15 ENCOUNTER — Ambulatory Visit: Payer: Medicare Other | Attending: Specialist

## 2015-09-15 DIAGNOSIS — G4761 Periodic limb movement disorder: Secondary | ICD-10-CM | POA: Diagnosis not present

## 2015-09-15 DIAGNOSIS — R0683 Snoring: Secondary | ICD-10-CM | POA: Diagnosis present

## 2015-09-15 DIAGNOSIS — G4733 Obstructive sleep apnea (adult) (pediatric): Secondary | ICD-10-CM | POA: Diagnosis not present

## 2015-09-20 ENCOUNTER — Other Ambulatory Visit: Payer: Self-pay | Admitting: Family Medicine

## 2015-09-28 ENCOUNTER — Ambulatory Visit: Payer: Medicare Other | Admitting: Family Medicine

## 2015-10-01 ENCOUNTER — Other Ambulatory Visit: Payer: Self-pay | Admitting: Cardiovascular Disease

## 2015-10-04 ENCOUNTER — Telehealth: Payer: Self-pay | Admitting: Cardiovascular Disease

## 2015-10-04 NOTE — Telephone Encounter (Signed)
Pt calling stating last time he was seen in our office. He went to the Hospital for some congestion he was having with some heart issues.  Seems like its coming back. States he can't walk but so far without getting SOB Just wants to know if there's something we can do to prevent him from going back into the hospital Pt has already called his Pulmonologist (Dr Vella Kohler) about his COPD and the SOB ( he thinks its more of a breathing problem)  But would like to make sure his heart is okay as well  Please advise.

## 2015-10-04 NOTE — Telephone Encounter (Signed)
Patient returning call from [place].  °

## 2015-10-04 NOTE — Telephone Encounter (Signed)
S/w pt who reports since he was d/c'd from [facility] March 30, he has been SOB, has had no energy and chest congestion. He was prescribed levaquin at 4/3 OV for possible PNA. Pt states he called Dr. Raul Del, pulmonologist, today as well as he is unsure "if this is a lung or heart issue". Reports his office has prescirbed prednisone "and something else" as he is unsure of what the other medication is. He has been instructed by pulmonology to call back in 2 days for appt if sx are not improved.  Reports 2lb weight gain last Friday, June 2. He took an extra lasix. Sx improved. He continues to weigh himself daily. He is able to carry on a conversation w/o appearing winded. Pt is agreeable to take new medications prescribed by pulmonology, continue to monitor sx, weigh himself daily and call if sx do not improve.  Pt verbalized understanding with no further questions at this time.

## 2015-10-04 NOTE — Telephone Encounter (Signed)
Left message on machine for patient to contact the office.   

## 2015-11-08 DIAGNOSIS — G4733 Obstructive sleep apnea (adult) (pediatric): Secondary | ICD-10-CM | POA: Diagnosis not present

## 2015-11-25 DIAGNOSIS — J441 Chronic obstructive pulmonary disease with (acute) exacerbation: Secondary | ICD-10-CM | POA: Diagnosis not present

## 2015-11-25 DIAGNOSIS — G4733 Obstructive sleep apnea (adult) (pediatric): Secondary | ICD-10-CM | POA: Diagnosis not present

## 2015-12-06 ENCOUNTER — Ambulatory Visit (INDEPENDENT_AMBULATORY_CARE_PROVIDER_SITE_OTHER): Payer: Medicare Other | Admitting: Cardiovascular Disease

## 2015-12-06 ENCOUNTER — Encounter: Payer: Self-pay | Admitting: Cardiovascular Disease

## 2015-12-06 VITALS — BP 104/72 | HR 114 | Ht 71.5 in | Wt 217.2 lb

## 2015-12-06 DIAGNOSIS — I481 Persistent atrial fibrillation: Secondary | ICD-10-CM

## 2015-12-06 DIAGNOSIS — I10 Essential (primary) hypertension: Secondary | ICD-10-CM

## 2015-12-06 DIAGNOSIS — I4719 Other supraventricular tachycardia: Secondary | ICD-10-CM

## 2015-12-06 DIAGNOSIS — E785 Hyperlipidemia, unspecified: Secondary | ICD-10-CM

## 2015-12-06 DIAGNOSIS — I471 Supraventricular tachycardia: Secondary | ICD-10-CM | POA: Diagnosis not present

## 2015-12-06 DIAGNOSIS — I4819 Other persistent atrial fibrillation: Secondary | ICD-10-CM

## 2015-12-06 DIAGNOSIS — I2510 Atherosclerotic heart disease of native coronary artery without angina pectoris: Secondary | ICD-10-CM

## 2015-12-06 NOTE — Patient Instructions (Signed)
Medication Instructions:  Your physician recommends that you continue on your current medications as directed. Please refer to the Current Medication list given to you today.   Labwork: BMET, CBC, TSH, liver profile  Testing/Procedures: none  Follow-Up: Your physician wants you to follow-up in: six months with Dr. Fletcher Anon. You will receive a reminder letter in the mail two months in advance. If you don't receive a letter, please call our office to schedule the follow-up appointment.  Your physician recommends that you schedule an appointment with Dr. Caryl Comes for atrial tachycardia.    Any Other Special Instructions Will Be Listed Below (If Applicable).     If you need a refill on your cardiac medications before your next appointment, please call your pharmacy.

## 2015-12-06 NOTE — Progress Notes (Signed)
Cardiology Office Note   Date:  12/06/2015   ID:  Philip GREENOUGH Sr., DOB 1946-04-22, MRN [id number]  PCP:  [name], MD  Cardiologist:   [name], MD   Chief Complaint  Patient presents with  . Other    3 month follow up. Meds reviewed by the patient verbally.  "doing well."       History of Present Illness: Philip [name]. is a 70 y.o. male who presents for a follow-up visit regarding Coronary artery disease, paroxysmal atrial fibrillation and atrial tachycardia.   He is status post CABG in January of 2014.  He had postoperative atrial fibrillation which was treated with amiodarone.    He is known to have frequent PACs and atrial tachycardia with pauses.  Echocardiogram in May 2015 showed low normal LV systolic function with ejection fraction of 50-55% with mild mitral regurgitation. In August 2016 he had suspected atrial fibrillation on his EKG and thus he was started on Eliquis. He was hospitalized in March for shortness of breath, cough, congestion and palpitations.  He was seen by Dr. Caryl Comes who started him on amiodarone to suppress his atrial arrhythmia. EKG showed recurrent atrial tachycardia. He had an echocardiogram done while hospitalized which showed stable LV systolic function with an ejection fraction of 50-55%.  He had significant worsening of cough and pulmonary congestion over the last few months. He was most recently seen by Dr. Raul Del few weeks ago and was prescribed prednisone and antibiotics. He reports improvement in symptoms since then. He is noted to be tachycardic today. However, he denies palpitations or dizziness. He has been taking amiodarone 200 mg twice daily.    Past Medical History:  Diagnosis Date  . Arthritis    a. right knee  . Asthma   . 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 etiology.  [name] COPD (chronic obstructive pulmonary disease) ([place])   . Coronary artery disease    a. 05/2012 Cath: severe 3VD-->CABG by Dr. Servando Snare in  05/2012 with LIMA to LAD, SVG to LCX & SVG to RPDA.  . Diabetes mellitus without complication ([place])   . History of bronchitis   . Hypercholesteremia   . Hypertension   . Hypothyroidism   . Shingles   . Thyroid disease   . Type II diabetes mellitus ([place])     Past Surgical History:  Procedure Laterality Date  . ANTERIOR CERVICAL DECOMP/DISCECTOMY FUSION  ~ 2009  . CARDIAC CATHETERIZATION  05/06/2012   Significant three-vessel coronary artery disease with normal ejection fraction  . CORONARY ARTERY BYPASS GRAFT  05/07/2012   Procedure: CORONARY ARTERY BYPASS GRAFTING (CABG);  Surgeon: [name], MD;  Location: [place];  Service: Open Heart Surgery;  Laterality: N/A;  times three  . INTRAOPERATIVE TRANSESOPHAGEAL ECHOCARDIOGRAM  05/07/2012   Procedure: INTRAOPERATIVE TRANSESOPHAGEAL ECHOCARDIOGRAM;  Surgeon: [name], MD;  Location: [place];  Service: Open Heart Surgery;  Laterality: N/A;  . KNEE ARTHROSCOPY  ~ 2000   "right" (05/06/2012)  . KNEE SURGERY  2003   right  . NECK SURGERY  2008   Plate in neck     Current Outpatient Prescriptions  Medication Sig Dispense Refill  . albuterol (PROVENTIL) (2.5 MG/3ML) 0.083% nebulizer solution Take 2.5 mg by nebulization every 6 (six) hours as needed for wheezing or shortness of breath.    [name] amiodarone (PACERONE) 200 MG tablet TAKE ONE TABLET BY MOUTH TWICE DAILY 60 tablet 3  .  atorvastatin (LIPITOR) 40 MG tablet TAKE ONE-HALF TABLET BY MOUTH AT BEDTIME 30 tablet 6  . ELIQUIS 5 MG TABS tablet TAKE ONE TABLET BY MOUTH TWICE DAILY 60 tablet 6  . esomeprazole (NEXIUM) 10 MG packet Take 10 mg by mouth daily before breakfast.     . furosemide (LASIX) 40 MG tablet Take 1 tablet (40 mg total) by mouth daily. 30 tablet 6  . gabapentin (NEURONTIN) 100 MG capsule Take 1 capsule (100 mg total) by mouth 3 (three) times daily. Take 3 capsules three times a day. 270 capsule 6  . glucose blood (ONE TOUCH ULTRA TEST) test strip 1 each by Other route 2  (two) times daily. Use as instructed to check bloods sugar twice a day. 100 each 12  . glucose monitoring kit (FREESTYLE) monitoring kit 1 each by Does not apply route as needed for other. 1 each 0  . LANCETS ULTRA THIN MISC 1 Device by Does not apply route 4 (four) times daily -  before meals and at bedtime. 200 each 5  . levothyroxine (SYNTHROID, LEVOTHROID) 75 MCG tablet TAKE ONE TABLET BY MOUTH ONCE DAILY 30 tablet 6  . metFORMIN (GLUCOPHAGE) 500 MG tablet Take 2 tablets by mouth 2 (two) times daily.    . metoprolol (LOPRESSOR) 50 MG tablet Take 50 mg by mouth 2 (two) times daily.     . montelukast (SINGULAIR) 10 MG tablet Take 10 mg by mouth at bedtime.     . ondansetron (ZOFRAN) 4 MG tablet Take 1 tablet (4 mg total) by mouth every 8 (eight) hours as needed for nausea or vomiting. 20 tablet 0  . PROAIR HFA 108 (90 BASE) MCG/ACT inhaler INHALE TWO PUFFS BY MOUTH 4 TIMES DAILY AS NEEDED 9 each 3  . sitaGLIPtin (JANUVIA) 100 MG tablet Take 1 tablet (100 mg total) by mouth daily. 30 tablet 6  . tiotropium (SPIRIVA) 18 MCG inhalation capsule Place 18 mcg into inhaler and inhale daily.      No current facility-administered medications for this visit.     Allergies:   Iodine; Penicillins; Sulfa antibiotics; Iodine; Ivp dye [iodinated diagnostic agents]; Penicillins; and Sulfa antibiotics    Social History:  The patient  reports that he quit smoking about 47 years ago. His smoking use included Cigarettes. He has a 8.00 pack-year smoking history. He has never used smokeless tobacco. He reports that he does not drink alcohol or use drugs.   Family History:  The patient's family history includes Clotting disorder in his mother; Heart disease in his mother; Hypertension in his sister.    ROS:  Please see the history of present illness.   Otherwise, review of systems are positive for none.   All other systems are reviewed and negative.    PHYSICAL EXAM: VS:  BP 104/72 (BP Location: Left Arm,  Patient Position: Sitting, Cuff Size: Normal)   Pulse (!) 114   Ht 5' 11.5" (1.816 m)   Wt 217 lb 4 oz (98.5 kg)   BMI 29.88 kg/m  , BMI Body mass index is 29.88 kg/m. GEN: Well nourished, well developed, in no acute distress HEENT: normal Neck: no JVD, carotid bruits, or masses Cardiac: Regular rhythm but tachycardic; no murmurs, rubs, or gallops, +1 edema Respiratory: Lungs are clear, normal work of breathing GI: soft, nontender, nondistended, + BS MS: no deformity or atrophy Skin: warm and dry, no rash Neuro:  Strength and sensation are intact Psych: euthymic mood, full affect   EKG:  EKG is  ordered today. The ekg ordered today demonstrates atrial tachycardia with a heart rate of 114 bpm. Old septal infarct.  Recent Labs: 12/14/2014: ALT 26 07/23/2015: TSH 4.210 07/24/2015: B Natriuretic Peptide 397.0 07/28/2015: Hemoglobin 16.7; Magnesium 2.3; Platelets 225 07/29/2015: BUN 48; Creatinine, Ser 1.47; Potassium 5.0; Sodium 134    Lipid Panel    Component Value Date/Time   CHOL 139 12/14/2014 0834   TRIG 127 12/14/2014 0834   HDL 43 12/14/2014 0834   CHOLHDL 3.2 12/14/2014 0834   LDLCALC 71 12/14/2014 0834      Wt Readings from Last 3 Encounters:  12/06/15 217 lb 4 oz (98.5 kg)  09/02/15 224 lb (101.6 kg)  08/17/15 216 lb (98 kg)        ASSESSMENT AND PLAN:   1. Paroxysmal atrial fibrillation: He is currently on anticoagulation with Eliquis. Atrial fibrillation was only noted on one EKG and it's possible that the burden might not be high enough to require anticoagulation. I suspect that most of his arrhythmia is due to atrial tachycardia.  2. Atrial tachycardia with pauses:   He is noted to be in atrial tachycardia today in spite of taking amiodarone regularly. He is also on metoprolol 50 mg twice daily. I'm going to send him back to see Dr. Caryl Comes for an opinion regarding further management. I'm not sure if an ablation is an option in this kind of arrhythmia. I'm  worried that his continued tachycardia might lead to tachycardia-induced cardiomyopathy. Given that he is on amiodarone, I requested routine labs.  3. Bilateral leg edema: Suspected chronic venous insufficiency. Improved significantly with furosemide.  4. Coronary artery disease involving native coronary arteries without angina: He has no anginal symptoms.   5. Essential hypertension: Blood pressure is well controlled.  6. Hyperlipidemia: Continue treatment with atorvastatin. Most recent LDL was 71.   Disposition:   FU with me in 6 months  Signed,  [name], MD  12/06/2015 1:07 PM    [place]

## 2015-12-07 LAB — BASIC METABOLIC PANEL
BUN/Creatinine Ratio: 18 (ref 10–24)
BUN: 25 mg/dL (ref 8–27)
CALCIUM: 9 mg/dL (ref 8.6–10.2)
CHLORIDE: 101 mmol/L (ref 96–106)
CO2: 23 mmol/L (ref 18–29)
Creatinine, Ser: 1.39 mg/dL — ABNORMAL HIGH (ref 0.76–1.27)
GFR calc non Af Amer: 52 mL/min/{1.73_m2} — ABNORMAL LOW (ref >59)
GFR, EST AFRICAN AMERICAN: 60 mL/min/{1.73_m2} (ref >59)
Glucose: 149 mg/dL — ABNORMAL HIGH (ref 65–99)
POTASSIUM: 4 mmol/L (ref 3.5–5.2)
Sodium: 143 mmol/L (ref 134–144)

## 2015-12-07 LAB — HEPATIC FUNCTION PANEL
ALK PHOS: 81 IU/L (ref 39–117)
ALT: 27 IU/L (ref 0–44)
AST: 15 IU/L (ref 0–40)
Albumin: 3.9 g/dL (ref 3.6–4.8)
Bilirubin Total: 0.3 mg/dL (ref 0.0–1.2)
Bilirubin, Direct: 0.12 mg/dL (ref 0.00–0.40)
Total Protein: 5.5 g/dL — ABNORMAL LOW (ref 6.0–8.5)

## 2015-12-07 LAB — CBC
HEMOGLOBIN: 15.4 g/dL (ref 12.6–17.7)
Hematocrit: 44.8 % (ref 37.5–51.0)
MCH: 31 pg (ref 26.6–33.0)
MCHC: 34.4 g/dL (ref 31.5–35.7)
MCV: 90 fL (ref 79–97)
PLATELETS: 233 10*3/uL (ref 150–379)
RBC: 4.97 x10E6/uL (ref 4.14–5.80)
RDW: 13.1 % (ref 12.3–15.4)
WBC: 11.5 10*3/uL — ABNORMAL HIGH (ref 3.4–10.8)

## 2015-12-07 LAB — TSH: TSH: 6.38 u[IU]/mL — AB (ref 0.450–4.500)

## 2015-12-09 ENCOUNTER — Other Ambulatory Visit: Payer: Self-pay | Admitting: Family Medicine

## 2015-12-09 DIAGNOSIS — G4733 Obstructive sleep apnea (adult) (pediatric): Secondary | ICD-10-CM | POA: Diagnosis not present

## 2015-12-09 MED ORDER — LEVOTHYROXINE SODIUM 88 MCG PO TABS: 88.0000 ug | ORAL_TABLET | Freq: Every day | ORAL | 6 refills | Status: DC

## 2015-12-09 NOTE — Telephone Encounter (Signed)
Patient aware of change in levothyroxine and labs results.

## 2015-12-16 ENCOUNTER — Encounter: Payer: Self-pay | Admitting: Internal Medicine

## 2015-12-16 ENCOUNTER — Ambulatory Visit (INDEPENDENT_AMBULATORY_CARE_PROVIDER_SITE_OTHER): Payer: Medicare Other | Admitting: Internal Medicine

## 2015-12-16 VITALS — BP 110/72 | HR 81 | Ht 71.5 in | Wt 225.5 lb

## 2015-12-16 DIAGNOSIS — I471 Supraventricular tachycardia: Secondary | ICD-10-CM | POA: Diagnosis not present

## 2015-12-16 NOTE — Progress Notes (Signed)
Patient Care Team: Arlis Porta., MD as PCP - General (Unknown Physician Specialty) [name], MD as Consulting Physician (Cardiology)   HPI  Philip Potters Sr. is a 70 y.o. male Seen in follow-up for atrial tachycardia for which I saw him in hospital 3/17. He had been admitted with a COPD exacerbation and shortness of breath. He received nebulizers and steroids.   He was started on amiodarone at that time. He has a history of coronary artery disease with prior bypass surgery 2014. Echocardiogram 2015 demonstrated EF 50-55% with mild MR.  Echocardiogram 3/17 EF 50-55% and mild LAE (46/2.0/38)  He also has a history of atrial fibrillation demonstrated on an ECG reviewed today from 8/16. He was started on apixoban at that time  CHADS-VASc score greater than or equal to 4 (age-89, hypertension-1, coronary artery disease-1, diabetes-1)  He has continued to have problems with cough and pulmonary congestion and is been seen by pulmonary treated with prednisone and antibiotics with subsequent improvement.  He saw Dr. Gaylyn Cheers 8/17 ECG from that day demonstrated a heart rate of 114. Amiodarone was up titrated. Compared to an ECG from 09/02/15 P wave morphologies are nearly identical and the heart rate on the latter ECG was only 94.  Holter 4/17 was also reviewed demonstrating 10% PACs no significant runs  TSH and CBC were normal when checked 12/06/15  He has some exercise intolerance manifested by dyspnea. There is no chest pain.  Records and Results Reviewed As above   Past Medical History:  Diagnosis Date  . Arthritis    a. right knee  . Asthma   . 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 etiology.  [name] COPD (chronic obstructive pulmonary disease) ([place])   . Coronary artery disease    a. 05/2012 Cath: severe 3VD-->CABG by Dr. Servando Snare in 05/2012 with LIMA to LAD, SVG to LCX & SVG to RPDA.  . Diabetes mellitus without complication ([place])   . History of bronchitis   .  Hypercholesteremia   . Hypertension   . Hypothyroidism   . Shingles   . Thyroid disease   . Type II diabetes mellitus ([place])     Past Surgical History:  Procedure Laterality Date  . ANTERIOR CERVICAL DECOMP/DISCECTOMY FUSION  ~ 2009  . CARDIAC CATHETERIZATION  05/06/2012   Significant three-vessel coronary artery disease with normal ejection fraction  . CORONARY ARTERY BYPASS GRAFT  05/07/2012   Procedure: CORONARY ARTERY BYPASS GRAFTING (CABG);  Surgeon: [name], MD;  Location: [facility];  Service: Open Heart Surgery;  Laterality: N/A;  times three  . INTRAOPERATIVE TRANSESOPHAGEAL ECHOCARDIOGRAM  05/07/2012   Procedure: INTRAOPERATIVE TRANSESOPHAGEAL ECHOCARDIOGRAM;  Surgeon: [name], MD;  Location: [facility];  Service: Open Heart Surgery;  Laterality: N/A;  . KNEE ARTHROSCOPY  ~ 2000   "right" (05/06/2012)  . KNEE SURGERY  2003   right  . NECK SURGERY  2008   Plate in neck    Current Outpatient Prescriptions  Medication Sig Dispense Refill  . albuterol (PROVENTIL) (2.5 MG/3ML) 0.083% nebulizer solution Take 2.5 mg by nebulization every 6 (six) hours as needed for wheezing or shortness of breath.    [name] amiodarone (PACERONE) 200 MG tablet TAKE ONE TABLET BY MOUTH TWICE DAILY 60 tablet 3  . atorvastatin (LIPITOR) 40 MG tablet TAKE ONE-HALF TABLET BY MOUTH AT BEDTIME 30 tablet 6  . cetirizine (ZYRTEC) 10 MG tablet Take 10 mg by mouth as  needed for allergies.    [name] ELIQUIS 5 MG TABS tablet TAKE ONE TABLET BY MOUTH TWICE DAILY 60 tablet 6  . furosemide (LASIX) 40 MG tablet Take 1 tablet (40 mg total) by mouth daily. 30 tablet 6  . gabapentin (NEURONTIN) 100 MG capsule Take 1 capsule (100 mg total) by mouth 3 (three) times daily. Take 3 capsules three times a day. 270 capsule 6  . glucose blood (ONE TOUCH ULTRA TEST) test strip 1 each by Other route 2 (two) times daily. Use as instructed to check bloods sugar twice a day. 100 each 12  . glucose monitoring kit (FREESTYLE) monitoring  kit 1 each by Does not apply route as needed for other. 1 each 0  . LANCETS ULTRA THIN MISC 1 Device by Does not apply route 4 (four) times daily -  before meals and at bedtime. 200 each 5  . levothyroxine (SYNTHROID, LEVOTHROID) 88 MCG tablet Take 1 tablet (88 mcg total) by mouth daily. 30 tablet 6  . losartan (COZAAR) 100 MG tablet Take 100 mg by mouth daily.    . metFORMIN (GLUCOPHAGE) 500 MG tablet Take 2 tablets by mouth 2 (two) times daily.    . montelukast (SINGULAIR) 10 MG tablet Take 10 mg by mouth at bedtime.     . ondansetron (ZOFRAN) 4 MG tablet Take 1 tablet (4 mg total) by mouth every 8 (eight) hours as needed for nausea or vomiting. 20 tablet 0  . PROAIR HFA 108 (90 BASE) MCG/ACT inhaler INHALE TWO PUFFS BY MOUTH 4 TIMES DAILY AS NEEDED 9 each 3  . sitaGLIPtin (JANUVIA) 100 MG tablet Take 1 tablet (100 mg total) by mouth daily. 30 tablet 6  . tiotropium (SPIRIVA) 18 MCG inhalation capsule Place 18 mcg into inhaler and inhale daily.      No current facility-administered medications for this visit.     Allergies  Allergen Reactions  . Iodine Rash  . Penicillins Hives, Itching, Rash and Other (See Comments)    "broke out in welps" (05/06/2012)  . Sulfa Antibiotics Shortness Of Breath  . Iodine     Rash and itching  . Ivp Dye [Iodinated Diagnostic Agents]     Rash;redness  . Penicillins     Hives  . Sulfa Antibiotics     Difficulty Breathing      Review of Systems negative except from HPI and PMH  Physical Exam BP 110/72 (BP Location: Left Arm, Patient Position: Sitting, Cuff Size: Normal)   Pulse 81   Ht 5' 11.5" (1.816 m)   Wt 225 lb 8 oz (102.3 kg)   BMI 31.01 kg/m  Well developed and well nourished in no acute distress HENT normal E scleral and icterus clear Neck Supple JVP flat; carotids brisk and full Clear to ausculation Slow and irregular rate and rhythm, no murmurs gallops or rub Soft with active bowel sounds No clubbing cyanosis no Edema Alert and  oriented, grossly normal motor and sensory function Skin Warm and Dry  ECG demonstrates sinus bradycardia at a rate of about 45 with frequent short runs of nonsustained atrial tachycardia up to 110 bpm  Assessment and  Plan Atrial tachycardia  Atrial fibrillation  Amiodarone therapy  Ischemic heart disease with prior bypass surgery EF 50-55%  Hypothyroidism-treated  COPD   The tracings from May and August with identical P waves both demonstrate a P wave distinct from what likely is the sinus P wave notable on his ECG from today. Given the persistence of tachycardia  and has modest LV dysfunction and his ongoing use of relatively high dose antiarrhythmics, I will recheck to Dr. Greggory Brandy to consider consultation for catheter ablation of both his atrial tachycardia focus as well as his atrial fibrillation. For now we will continue him on amiodarone all be it at a decreased dose 400--200.    Surveillance laboratories were nearly normal earlier this month although his TSH is now 6.38. I suspect he would probably be better off on Synthroid 100 g. I will defer this to his primary care physician

## 2015-12-16 NOTE — Patient Instructions (Signed)
Medication Instructions: - Your physician recommends that you continue on your current medications as directed. Please refer to the Current Medication list given to you today.  Labwork: - none  Procedures/Testing: - none  Follow-Up: - pending Dr. [name] discussion with Dr. Rayann Heman.  Any Additional Special Instructions Will Be Listed Below (If Applicable).     If you need a refill on your cardiac medications before your next appointment, please call your pharmacy.

## 2015-12-22 ENCOUNTER — Telehealth: Payer: Self-pay | Admitting: *Deleted

## 2015-12-22 NOTE — Telephone Encounter (Signed)
Received notice from [facility] re: Losartan 100 mg. Losartan was d/c in 2016 by provider.

## 2015-12-27 ENCOUNTER — Telehealth: Payer: Self-pay | Admitting: Internal Medicine

## 2015-12-27 DIAGNOSIS — J439 Emphysema, unspecified: Secondary | ICD-10-CM | POA: Diagnosis not present

## 2015-12-27 DIAGNOSIS — R0609 Other forms of dyspnea: Secondary | ICD-10-CM | POA: Diagnosis not present

## 2015-12-27 DIAGNOSIS — G4733 Obstructive sleep apnea (adult) (pediatric): Secondary | ICD-10-CM | POA: Diagnosis not present

## 2015-12-27 NOTE — Telephone Encounter (Signed)
Patient was in office on 8/17 and is waiting to hear back about a conversation Caryl Comes was going to have with [facility] re catheter ablation.  Please call patient .  He is concerned that he may have missed a call and wants to know what the care plan is going to be.

## 2015-12-29 NOTE — Telephone Encounter (Signed)
Follow up ° ° °Pt is returning call for rn  °

## 2015-12-29 NOTE — Telephone Encounter (Signed)
Follow Up:; ° ° °Returning your call. °

## 2015-12-29 NOTE — Telephone Encounter (Signed)
Attempted to call the patient at his contact #- no answer/ no voice mail set up. Attempted to call his wife's (DPR) contact #- rang multiple times- no answer/ no voice mail. Will call back at later time.  Per Dr. Caryl Comes- strips not yet reviewed with Dr. Rayann Heman as they have not been in the office at the same time since the patient was seen.

## 2015-12-29 NOTE — Telephone Encounter (Signed)
I spoke with the patient. He is aware that Dr. Caryl Comes and Dr. Rayann Heman have note been in the office at the same time to review his EKG tracings. I have advised him that we will call him back as soon as the two doctors have spoken.

## 2016-01-09 DIAGNOSIS — G4733 Obstructive sleep apnea (adult) (pediatric): Secondary | ICD-10-CM | POA: Diagnosis not present

## 2016-01-18 ENCOUNTER — Other Ambulatory Visit: Payer: Self-pay | Admitting: Family Medicine

## 2016-01-18 ENCOUNTER — Telehealth: Payer: Self-pay | Admitting: Internal Medicine

## 2016-01-18 DIAGNOSIS — E119 Type 2 diabetes mellitus without complications: Secondary | ICD-10-CM

## 2016-01-18 NOTE — Telephone Encounter (Signed)
Pt is calling back regarding his EKG tracings. Please advise. See previous telephone note.

## 2016-01-18 NOTE — Telephone Encounter (Signed)
Blain Pais at 01/18/2016 8:56 AM   Status: Signed    Pt is calling back regarding his EKG tracings. Please advise. See previous telephone note.

## 2016-01-18 NOTE — Telephone Encounter (Signed)
Still awaiting review with Dr. Caryl Comes and Dr. Rayann Heman. EKG's in Dr. [name] red folder.

## 2016-01-18 NOTE — Telephone Encounter (Signed)
Previous encounter already open. Will close this encounter.

## 2016-02-08 DIAGNOSIS — G4733 Obstructive sleep apnea (adult) (pediatric): Secondary | ICD-10-CM | POA: Diagnosis not present

## 2016-02-21 ENCOUNTER — Other Ambulatory Visit: Payer: Self-pay | Admitting: Cardiovascular Disease

## 2016-03-10 DIAGNOSIS — G4733 Obstructive sleep apnea (adult) (pediatric): Secondary | ICD-10-CM | POA: Diagnosis not present

## 2016-03-22 ENCOUNTER — Encounter: Payer: Self-pay | Admitting: Internal Medicine

## 2016-03-22 NOTE — Progress Notes (Unsigned)
reviewwwd strips yesterday with JA;  Amenable to ablation depending on symptoms  Tried to call pt today and his VM is full Will arrange office followup

## 2016-03-27 ENCOUNTER — Other Ambulatory Visit: Payer: Self-pay | Admitting: Cardiovascular Disease

## 2016-03-27 ENCOUNTER — Other Ambulatory Visit: Payer: Self-pay | Admitting: Family Medicine

## 2016-03-28 ENCOUNTER — Other Ambulatory Visit: Payer: Self-pay | Admitting: Family Medicine

## 2016-03-28 MED ORDER — LOSARTAN POTASSIUM 100 MG PO TABS: 100.0000 mg | ORAL_TABLET | Freq: Every day | ORAL | 6 refills | Status: DC

## 2016-03-31 ENCOUNTER — Telehealth: Payer: Self-pay | Admitting: Family Medicine

## 2016-03-31 ENCOUNTER — Other Ambulatory Visit: Payer: Self-pay | Admitting: Family Medicine

## 2016-03-31 NOTE — Telephone Encounter (Signed)
Verbal was given with 1 refill was authorized by Dr. Luan Pulling in past.

## 2016-03-31 NOTE — Telephone Encounter (Signed)
Tiffany at [facility] has a question about pt's proair inhaler.  Please call [phone]

## 2016-04-03 DIAGNOSIS — R05 Cough: Secondary | ICD-10-CM | POA: Diagnosis not present

## 2016-04-03 DIAGNOSIS — R490 Dysphonia: Secondary | ICD-10-CM | POA: Diagnosis not present

## 2016-04-03 DIAGNOSIS — J439 Emphysema, unspecified: Secondary | ICD-10-CM | POA: Diagnosis not present

## 2016-04-09 DIAGNOSIS — G4733 Obstructive sleep apnea (adult) (pediatric): Secondary | ICD-10-CM | POA: Diagnosis not present

## 2016-04-20 ENCOUNTER — Other Ambulatory Visit: Payer: Self-pay | Admitting: Family Medicine

## 2016-04-20 DIAGNOSIS — G629 Polyneuropathy, unspecified: Secondary | ICD-10-CM

## 2016-04-29 ENCOUNTER — Other Ambulatory Visit: Payer: Self-pay | Admitting: Family Medicine

## 2016-05-03 ENCOUNTER — Other Ambulatory Visit: Payer: Self-pay | Admitting: *Deleted

## 2016-05-03 MED ORDER — LEVOTHYROXINE SODIUM 88 MCG PO TABS: 88.0000 ug | ORAL_TABLET | Freq: Every day | ORAL | 6 refills | Status: DC

## 2016-05-08 ENCOUNTER — Ambulatory Visit
Admission: RE | Admit: 2016-05-08 | Discharge: 2016-05-08 | Disposition: A | Discharge status: Home / Self Care | Payer: Medicare Other | Source: Ambulatory Visit | Attending: Family Medicine | Admitting: Family Medicine

## 2016-05-08 ENCOUNTER — Ambulatory Visit: Admission: RE | Admit: 2016-05-08 | Payer: Medicare Other | Source: Ambulatory Visit

## 2016-05-08 ENCOUNTER — Other Ambulatory Visit: Payer: Self-pay | Admitting: Family Medicine

## 2016-05-08 DIAGNOSIS — R0600 Dyspnea, unspecified: Secondary | ICD-10-CM

## 2016-05-08 DIAGNOSIS — R05 Cough: Secondary | ICD-10-CM | POA: Diagnosis not present

## 2016-05-08 DIAGNOSIS — R0602 Shortness of breath: Secondary | ICD-10-CM | POA: Diagnosis not present

## 2016-05-10 DIAGNOSIS — G4733 Obstructive sleep apnea (adult) (pediatric): Secondary | ICD-10-CM | POA: Diagnosis not present

## 2016-05-15 ENCOUNTER — Ambulatory Visit (INDEPENDENT_AMBULATORY_CARE_PROVIDER_SITE_OTHER): Payer: Medicare Other | Admitting: Family Medicine

## 2016-05-15 ENCOUNTER — Encounter: Payer: Self-pay | Admitting: Family Medicine

## 2016-05-15 VITALS — BP 125/70 | HR 100 | Temp 97.9°F | Resp 16 | Ht 71.5 in | Wt 221.0 lb

## 2016-05-15 DIAGNOSIS — N182 Chronic kidney disease, stage 2 (mild): Secondary | ICD-10-CM

## 2016-05-15 DIAGNOSIS — I10 Essential (primary) hypertension: Secondary | ICD-10-CM | POA: Diagnosis not present

## 2016-05-15 DIAGNOSIS — J4489 Other specified chronic obstructive pulmonary disease: Secondary | ICD-10-CM

## 2016-05-15 DIAGNOSIS — J449 Chronic obstructive pulmonary disease, unspecified: Secondary | ICD-10-CM

## 2016-05-15 DIAGNOSIS — E7800 Pure hypercholesterolemia, unspecified: Secondary | ICD-10-CM

## 2016-05-15 DIAGNOSIS — E1122 Type 2 diabetes mellitus with diabetic chronic kidney disease: Secondary | ICD-10-CM

## 2016-05-15 DIAGNOSIS — E0590 Thyrotoxicosis, unspecified without thyrotoxic crisis or storm: Secondary | ICD-10-CM | POA: Diagnosis not present

## 2016-05-15 DIAGNOSIS — E119 Type 2 diabetes mellitus without complications: Secondary | ICD-10-CM

## 2016-05-15 DIAGNOSIS — E1142 Type 2 diabetes mellitus with diabetic polyneuropathy: Secondary | ICD-10-CM

## 2016-05-15 DIAGNOSIS — G629 Polyneuropathy, unspecified: Secondary | ICD-10-CM

## 2016-05-15 LAB — POCT GLYCOSYLATED HEMOGLOBIN (HGB A1C): HEMOGLOBIN A1C: 8.8

## 2016-05-15 MED ORDER — GABAPENTIN 300 MG PO CAPS: 300.0000 mg | ORAL_CAPSULE | Freq: Three times a day (TID) | ORAL | 3 refills | Status: DC

## 2016-05-15 MED ORDER — INSULIN GLARGINE 100 UNIT/ML SOLOSTAR PEN: 6.0000 [IU] | PEN_INJECTOR | Freq: Every day | SUBCUTANEOUS | 11 refills | Status: DC

## 2016-05-15 MED ORDER — GLUCOSE BLOOD VI STRP: 1.0000 | ORAL_STRIP | Freq: Two times a day (BID) | 12 refills | Status: DC

## 2016-05-15 NOTE — Patient Instructions (Signed)
Call blood sugar weekly to adjust insulin dose.

## 2016-05-15 NOTE — Progress Notes (Signed)
Name: Philip HOLLINGS Sr.   MRN: [id number]    DOB: Sep 04, 1945   Date:05/15/2016       Progress Note  Subjective  Chief Complaint  Chief Complaint  Patient presents with  . peripheral polyneuropathy  . Diabetes  . Hypertension    HPI Here for f/u of HBP and DM with peripheral neuropathy.  His Neuroathy is doing very well at present.  Hios sugars have run as much as 400 since he has been on prednisone on a regular basis for past 2 months or so.[name]  No problem-specific Assessment & Plan notes found for this encounter.   Past Medical History:  Diagnosis Date  . Arthritis    a. right knee  . Asthma   . 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 etiology.  [name] COPD (chronic obstructive pulmonary disease) ([place])   . Coronary artery disease    a. 05/2012 Cath: severe 3VD-->CABG by Dr. Servando Snare in 05/2012 with LIMA to LAD, SVG to LCX & SVG to RPDA.  . Diabetes mellitus without complication ([place])   . History of bronchitis   . Hypercholesteremia   . Hypertension   . Hypothyroidism   . Shingles   . Thyroid disease   . Type II diabetes mellitus ([place])     Past Surgical History:  Procedure Laterality Date  . ANTERIOR CERVICAL DECOMP/DISCECTOMY FUSION  ~ 2009  . CARDIAC CATHETERIZATION  05/06/2012   Significant three-vessel coronary artery disease with normal ejection fraction  . CORONARY ARTERY BYPASS GRAFT  05/07/2012   Procedure: CORONARY ARTERY BYPASS GRAFTING (CABG);  Surgeon: [name], MD;  Location: [facility];  Service: Open Heart Surgery;  Laterality: N/A;  times three  . INTRAOPERATIVE TRANSESOPHAGEAL ECHOCARDIOGRAM  05/07/2012   Procedure: INTRAOPERATIVE TRANSESOPHAGEAL ECHOCARDIOGRAM;  Surgeon: [name], MD;  Location: [facility];  Service: Open Heart Surgery;  Laterality: N/A;  . KNEE ARTHROSCOPY  ~ 2000   "right" (05/06/2012)  . KNEE SURGERY  2003   right  . NECK SURGERY  2008   Plate in neck    Family History  Problem Relation Age of Onset  . Heart disease Mother   .  Clotting disorder Mother   . Hypertension Sister     Social History   Social History  . Marital status: Married    Spouse name: N/A  . Number of children: N/A  . Years of education: N/A   Occupational History  . Not on file.   Social History Main Topics  . Smoking status: Former Smoker    Packs/day: 1.00    Years: 8.00    Types: Cigarettes    Quit date: 12/31/1967  . Smokeless tobacco: Never Used  . Alcohol use No     Comment: 05/06/2012 "last alcohol several years ago; never had problem wit"  . Drug use: No  . Sexual activity: Not Currently   Other Topics Concern  . Not on file   Social History Narrative   ** Merged History Encounter **         Current Outpatient Prescriptions:  .  albuterol (PROVENTIL) (2.5 MG/3ML) 0.083% nebulizer solution, Inhale 3 mLs into the lungs every 6 (six) hours as needed., Disp: , Rfl:  .  Albuterol Sulfate 108 (90 Base) MCG/ACT AEPB, Inhale 2 puffs into the lungs every 6 (six) hours as needed., Disp: , Rfl:  .  amiodarone (PACERONE) 200 MG tablet, TAKE ONE TABLET BY MOUTH TWICE DAILY, Disp: 180 tablet, Rfl: 3 .  atorvastatin (LIPITOR) 40 MG tablet, TAKE ONE-HALF TABLET BY MOUTH AT BEDTIME, Disp: 90 tablet, Rfl: 2 .  cetirizine (ZYRTEC) 10 MG tablet, Take 10 mg by mouth as needed for allergies., Disp: , Rfl:  .  ELIQUIS 5 MG TABS tablet, TAKE ONE TABLET BY MOUTH TWICE DAILY, Disp: 60 tablet, Rfl: 6 .  furosemide (LASIX) 40 MG tablet, TAKE ONE TABLET BY MOUTH ONCE DAILY, Disp: 90 tablet, Rfl: 3 .  gabapentin (NEURONTIN) 300 MG capsule, Take 1 capsule (300 mg total) by mouth 3 (three) times daily., Disp: 270 capsule, Rfl: 3 .  glucose blood (ONE TOUCH ULTRA TEST) test strip, 1 each by Other route 2 (two) times daily. Use as instructed to check bloods sugar twice a day., Disp: 100 each, Rfl: 12 .  glucose monitoring kit (FREESTYLE) monitoring kit, 1 each by Does not apply route as needed for other., Disp: 1 each, Rfl: 0 .  LANCETS ULTRA THIN MISC,  1 Device by Does not apply route 4 (four) times daily -  before meals and at bedtime., Disp: 200 each, Rfl: 5 .  levothyroxine (SYNTHROID, LEVOTHROID) 88 MCG tablet, Take 1 tablet (88 mcg total) by mouth daily., Disp: 30 tablet, Rfl: 6 .  losartan (COZAAR) 100 MG tablet, Take 1 tablet (100 mg total) by mouth daily., Disp: 30 tablet, Rfl: 6 .  metFORMIN (GLUCOPHAGE) 500 MG tablet, TAKE TWO TABLETS BY MOUTH TWICE DAILY WITH FOOD, Disp: 360 tablet, Rfl: 3 .  montelukast (SINGULAIR) 10 MG tablet, Take 10 mg by mouth at bedtime. , Disp: , Rfl:  .  ondansetron (ZOFRAN) 4 MG tablet, Take 1 tablet (4 mg total) by mouth every 8 (eight) hours as needed for nausea or vomiting., Disp: 20 tablet, Rfl: 0 .  PROAIR HFA 108 (90 Base) MCG/ACT inhaler, INHALE TWO PUFFS BY MOUTH 4 TIMES DAILY AS NEEDED, Disp: 9 each, Rfl: 3 .  sitaGLIPtin (JANUVIA) 100 MG tablet, Take 1 tablet (100 mg total) by mouth daily., Disp: 30 tablet, Rfl: 6 .  doxycycline (VIBRAMYCIN) 100 MG capsule, Take 100 mg by mouth 2 (two) times daily., Disp: , Rfl:  .  Insulin Glargine (LANTUS) 100 UNIT/ML Solostar Pen, Inject 6 Units into the skin daily at 10 pm., Disp: 5 pen, Rfl: 11 .  predniSONE (DELTASONE) 10 MG tablet, Take 10 mg by mouth daily., Disp: , Rfl:  .  tiotropium (SPIRIVA) 18 MCG inhalation capsule, Place 18 mcg into inhaler and inhale daily. , Disp: , Rfl:   Allergies  Allergen Reactions  . Iodine Rash  . Penicillins Hives, Itching, Rash and Other (See Comments)    "broke out in welps" (05/06/2012)  . Sulfa Antibiotics Shortness Of Breath  . Iodine     Rash and itching  . Ivp Dye [Iodinated Diagnostic Agents]     Rash;redness  . Penicillins     Hives  . Sulfa Antibiotics     Difficulty Breathing     Review of Systems  Constitutional: Negative for chills, fever, malaise/fatigue and weight loss.  HENT: Negative for hearing loss and tinnitus.        Hoarseness  Eyes: Negative for blurred vision and double vision.   Respiratory: Positive for cough (oc c.), sputum production and wheezing. Negative for hemoptysis and shortness of breath.   Cardiovascular: Negative for chest pain, palpitations and leg swelling.  Gastrointestinal: Negative for abdominal pain, blood in stool and heartburn.  Genitourinary: Negative for dysuria, frequency and urgency.  Skin: Negative for rash.  Neurological: Negative  for dizziness, tingling, tremors, weakness and headaches.      Objective  Vitals:   05/15/16 1027 05/15/16 1112  BP: 121/84 125/70  Pulse: (!) 102 100  Resp: 16   Temp: 97.9 F (36.6 C)   TempSrc: Oral   Weight: 221 lb (100.2 kg)   Height: 5' 11.5" (1.816 m)     Physical Exam  Constitutional: He is oriented to person, place, and time and well-developed, well-nourished, and in no distress. No distress.  HENT:  Head: Normocephalic and atraumatic.  Eyes: Conjunctivae and EOM are normal. Pupils are equal, round, and reactive to light. No scleral icterus.  Neck: Normal range of motion. Neck supple. Carotid bruit is not present. No thyromegaly present.  Cardiovascular: Normal rate, regular rhythm and normal heart sounds.  Exam reveals no gallop and no friction rub.   No murmur heard. Pulmonary/Chest: Effort normal. No respiratory distress. He has wheezes. He has no rales.  Musculoskeletal: He exhibits no edema.  Lymphadenopathy:    He has no cervical adenopathy.  Neurological: He is alert and oriented to person, place, and time.  Vitals reviewed.      Recent Results (from the past 2160 hour(s))  POCT HgB A1C     Status: Abnormal   Collection Time: 05/15/16 10:44 AM  Result Value Ref Range   Hemoglobin A1C 8.8      Assessment & Plan  Problem List Items Addressed This Visit      Cardiovascular and Mediastinum   Essential (primary) hypertension (Chronic)     Respiratory   COPD with asthma ([place])   Relevant Medications   albuterol (PROVENTIL) (2.5 MG/3ML) 0.083% nebulizer solution    predniSONE (DELTASONE) 10 MG tablet   Albuterol Sulfate 108 (90 Base) MCG/ACT AEPB     Endocrine   Diabetes mellitus (HCC)   Relevant Medications   Insulin Glargine (LANTUS) 100 UNIT/ML Solostar Pen   Hyperthyroidism   Type 2 diabetes mellitus with peripheral neuropathy (HCC)   Relevant Medications   Insulin Glargine (LANTUS) 100 UNIT/ML Solostar Pen   gabapentin (NEURONTIN) 300 MG capsule   glucose blood (ONE TOUCH ULTRA TEST) test strip     Nervous and Auditory   Peripheral neuropathy (HCC)   Relevant Medications   gabapentin (NEURONTIN) 300 MG capsule     Other   Hypercholesteremia    Other Visit Diagnoses    Diabetes mellitus without complication ([place])    -  Primary   Relevant Medications   Insulin Glargine (LANTUS) 100 UNIT/ML Solostar Pen   Other Relevant Orders   POCT HgB A1C (Completed)      Meds ordered this encounter  Medications  . albuterol (PROVENTIL) (2.5 MG/3ML) 0.083% nebulizer solution    Sig: Inhale 3 mLs into the lungs every 6 (six) hours as needed.  . predniSONE (DELTASONE) 10 MG tablet    Sig: Take 10 mg by mouth daily.  [name] doxycycline (VIBRAMYCIN) 100 MG capsule    Sig: Take 100 mg by mouth 2 (two) times daily.  . Albuterol Sulfate 108 (90 Base) MCG/ACT AEPB    Sig: Inhale 2 puffs into the lungs every 6 (six) hours as needed.  . Insulin Glargine (LANTUS) 100 UNIT/ML Solostar Pen    Sig: Inject 6 Units into the skin daily at 10 pm.    Dispense:  5 pen    Refill:  11  . gabapentin (NEURONTIN) 300 MG capsule    Sig: Take 1 capsule (300 mg total) by mouth 3 (three) times daily.  Dispense:  270 capsule    Refill:  3    Please consider 90 day supplies to promote better adherence  . glucose blood (ONE TOUCH ULTRA TEST) test strip    Sig: 1 each by Other route 2 (two) times daily. Use as instructed to check bloods sugar twice a day.    Dispense:  100 each    Refill:  12   1. Diabetes mellitus without complication ([place])  - POCT HgB A1C-8.8 2.  Essential (primary) hypertension   3. COPD with asthma ([place])   4. Type 2 diabetes mellitus with stage 2 chronic kidney disease, without long-term current use of insulin ([place])   5. Hyperthyroidism   6. Hypercholesteremia   7. Peripheral polyneuropathy (HCC)  - gabapentin (NEURONTIN) 300 MG capsule; Take 1 capsule (300 mg total) by mouth 3 (three) times daily.  Dispense: 270 capsule; Refill: 3  8. Type 2 diabetes mellitus with peripheral neuropathy (HCC)  - Insulin Glargine (LANTUS) 100 UNIT/ML Solostar Pen; Inject 6 Units into the skin daily at 10 pm.  Dispense: 5 pen; Refill: 11 - glucose blood (ONE TOUCH ULTRA TEST) test strip; 1 each by Other route 2 (two) times daily. Use as instructed to check bloods sugar twice a day.  Dispense: 100 each; Refill: 12  Continue all current meds at present doses

## 2016-05-24 ENCOUNTER — Telehealth: Payer: Self-pay | Admitting: Family Medicine

## 2016-05-24 NOTE — Telephone Encounter (Signed)
Pt checked BP and it was 112/49.  He wanted to make sure the bottom number wasn't too low.  His call back number is [phone]

## 2016-05-25 NOTE — Telephone Encounter (Signed)
Relayed message to patient's spouse.[place]

## 2016-05-25 NOTE — Telephone Encounter (Signed)
A little low.  Drink more water and cont to check BPs iof cont to stay this low, let us know.-jh

## 2016-05-29 ENCOUNTER — Telehealth: Payer: Self-pay | Admitting: Family Medicine

## 2016-05-29 DIAGNOSIS — E1142 Type 2 diabetes mellitus with diabetic polyneuropathy: Secondary | ICD-10-CM

## 2016-05-29 MED ORDER — SITAGLIPTIN PHOSPHATE 100 MG PO TABS: 100.0000 mg | ORAL_TABLET | Freq: Every day | ORAL | 6 refills | Status: DC

## 2016-05-29 MED ORDER — GLUCOSE BLOOD VI STRP: 1.0000 | ORAL_STRIP | Freq: Two times a day (BID) | 12 refills | Status: DC

## 2016-05-29 NOTE — Telephone Encounter (Signed)
Pt. requesting a refill on Januvia  100 mg , pt also requested test strips, needle (did not give  me  The names.  Pt  Call back  # is  [phone]

## 2016-05-30 ENCOUNTER — Telehealth: Payer: Self-pay | Admitting: *Deleted

## 2016-05-30 NOTE — Telephone Encounter (Signed)
Patient advised to increase insulin to 8 units per Dr. Luan Pulling.

## 2016-06-02 ENCOUNTER — Other Ambulatory Visit: Payer: Self-pay | Admitting: *Deleted

## 2016-06-02 NOTE — Progress Notes (Signed)
Called [facility] and gave verbal to refill pen needle with 11 refills and strips.[place]

## 2016-06-10 DIAGNOSIS — G4733 Obstructive sleep apnea (adult) (pediatric): Secondary | ICD-10-CM | POA: Diagnosis not present

## 2016-06-12 ENCOUNTER — Telehealth: Payer: Self-pay | Admitting: *Deleted

## 2016-06-12 NOTE — Telephone Encounter (Signed)
After Dr. Luan Pulling reviewed bs results. Patient advised to increase Lantus from 8 units to 10 units.  Patient needs to report bs in next 10-14 days.

## 2016-06-19 ENCOUNTER — Telehealth: Payer: Self-pay | Admitting: *Deleted

## 2016-06-19 NOTE — Telephone Encounter (Signed)
Blood sugar results reviewed. Dr. Luan Pulling would like for patient to increase Lantus to 12 units and report in 10 days.[place]

## 2016-07-03 ENCOUNTER — Telehealth: Payer: Self-pay | Admitting: *Deleted

## 2016-07-03 ENCOUNTER — Encounter: Payer: Self-pay | Admitting: *Deleted

## 2016-07-03 ENCOUNTER — Other Ambulatory Visit: Payer: Self-pay | Admitting: *Deleted

## 2016-07-03 DIAGNOSIS — E1142 Type 2 diabetes mellitus with diabetic polyneuropathy: Secondary | ICD-10-CM

## 2016-07-03 MED ORDER — INSULIN ASPART 100 UNIT/ML FLEXPEN: 6.0000 [IU] | PEN_INJECTOR | Freq: Every day | SUBCUTANEOUS | 11 refills | Status: DC

## 2016-07-03 MED ORDER — INSULIN GLARGINE 100 UNIT/ML SOLOSTAR PEN: 10.0000 [IU] | PEN_INJECTOR | Freq: Every day | SUBCUTANEOUS | 11 refills | Status: DC

## 2016-07-03 NOTE — Telephone Encounter (Signed)
Patient instructed to decrease Lantus to 10 units. Novolog is being added 6 units. Patient is still taking Prednisone. Patient advised to start checking blood sugars three times daily.

## 2016-07-08 DIAGNOSIS — G4733 Obstructive sleep apnea (adult) (pediatric): Secondary | ICD-10-CM | POA: Diagnosis not present

## 2016-07-17 ENCOUNTER — Telehealth: Payer: Self-pay | Admitting: *Deleted

## 2016-07-17 NOTE — Telephone Encounter (Signed)
After reviewing bs sugar results: Patient needs to increase Novolog to 8 units and leave Lantus at 10 units.

## 2016-07-24 ENCOUNTER — Telehealth: Payer: Self-pay | Admitting: *Deleted

## 2016-07-24 NOTE — Telephone Encounter (Signed)
After reviewing blood sugar report 07/13/16-07/21/16. Dr.Hawkins would like for patient to increase Novolog to 10 units.[name]

## 2016-07-31 ENCOUNTER — Ambulatory Visit (INDEPENDENT_AMBULATORY_CARE_PROVIDER_SITE_OTHER): Payer: Medicare Other | Admitting: Family Medicine

## 2016-07-31 ENCOUNTER — Encounter: Payer: Self-pay | Admitting: Family Medicine

## 2016-07-31 VITALS — BP 148/73 | HR 116 | Temp 98.1°F | Resp 16 | Ht 71.5 in | Wt 232.0 lb

## 2016-07-31 DIAGNOSIS — J302 Other seasonal allergic rhinitis: Secondary | ICD-10-CM | POA: Diagnosis not present

## 2016-07-31 DIAGNOSIS — J309 Allergic rhinitis, unspecified: Secondary | ICD-10-CM | POA: Insufficient documentation

## 2016-07-31 DIAGNOSIS — J449 Chronic obstructive pulmonary disease, unspecified: Secondary | ICD-10-CM | POA: Diagnosis not present

## 2016-07-31 DIAGNOSIS — J441 Chronic obstructive pulmonary disease with (acute) exacerbation: Secondary | ICD-10-CM

## 2016-07-31 MED ORDER — PREDNISONE 50 MG PO TABS: 50.0000 mg | ORAL_TABLET | Freq: Every day | ORAL | 0 refills | Status: DC

## 2016-07-31 MED ORDER — IPRATROPIUM-ALBUTEROL 0.5-2.5 (3) MG/3ML IN SOLN: 3.0000 mL | Freq: Once | RESPIRATORY_TRACT | Status: DC

## 2016-07-31 NOTE — Progress Notes (Signed)
Subjective:    Patient ID: Philip Husbands., male    DOB: 1946-03-13, 71 y.o.   MRN: [id number]  Philip POND Sr. is a 71 y.o. male presenting on 07/31/2016 for Asthma (onset 3 days SOB)  Patient presents for a same day appointment.  HPI   COPD EXACERBATION: Reports symptoms started gradually over past 1-2 weeks with some allergy symptoms, mostly related to pollen and environmental allergies. He continues with OTC Cetirizine, Singulair. - Now had worsening over past 3 days with worsening wheezing and cough, some productive sputum, associated with dyspnea. Similar to prior COPD / asthma flare up. Last COPD exac flare up in 05/2016 required Doxycycline and Prednisone taper 6 day. - Using Albuterol nebulizer every 4 hours with some relief only temporary (uses inhaler if out) - Also taking chronic Prednisone 10mg  daily for COPD (last year March 2017 hospitalized after similar flare and dx with PNA), and Spiriva. He is followed by Pulmonology [facility], Dr Raul Del), he is unsure when next apt is, thinks in few months. - Also has OSA, wears CPAP machine, unsure if this was affecting his symptoms - No known sick contacts - Denies fevers/chills, sweats, nausea, vomiting, chest pain or tightness   Social History  Substance Use Topics  . Smoking status: Former Smoker    Packs/day: 1.00    Years: 8.00    Types: Cigarettes    Quit date: 12/31/1967  . Smokeless tobacco: Never Used  . Alcohol use No     Comment: 05/06/2012 "last alcohol several years ago; never had problem wit"    Review of Systems Per HPI unless specifically indicated above     Objective:    BP (!) 148/73   Pulse (!) 116   Temp 98.1 F (36.7 C) (Oral)   Resp 16   Ht 5' 11.5" (1.816 m)   Wt 232 lb (105.2 kg)   SpO2 96%   BMI 31.91 kg/m   Wt Readings from Last 3 Encounters:  07/31/16 232 lb (105.2 kg)  05/15/16 221 lb (100.2 kg)  12/16/15 225 lb 8 oz (102.3 kg)    Physical Exam  Constitutional: He is  oriented to person, place, and time. He appears well-developed and well-nourished. No distress.  Mostly well appearing, comfortable, cooperative  HENT:  Head: Normocephalic and atraumatic.  Mouth/Throat: Oropharynx is clear and moist.  Frontal / maxillary sinuses non-tender. Nares patent with some congestion without purulence. Bilateral TMs clear without erythema, effusion or bulging. Oropharynx clear without erythema, exudates, edema or asymmetry.  Eyes: Conjunctivae are normal. Right eye exhibits no discharge. Left eye exhibits no discharge.  Neck: Normal range of motion. Neck supple.  Cardiovascular: Regular rhythm, normal heart sounds and intact distal pulses.   No murmur heard. Tachycardic  Pulmonary/Chest:  Pre-Nebulizer Treatment Mild increased work of breathing with unable to take full deep breaths, some diffuse reduced air movement with tight exp wheezing, no focal crackles but some coarse breath sounds.  Post-Nebulizer (Duoneb x 1) Treatment Slight improvement in air movement but persistent exp wheezing and coarse breath sounds, some slightly improved but still persistent increased work of breathing.  Musculoskeletal: He exhibits no edema.  Neurological: He is alert and oriented to person, place, and time.  Skin: Skin is warm and dry. No rash noted. He is not diaphoretic. No erythema.  Psychiatric: His behavior is normal.  Nursing note and vitals reviewed.   Results for orders placed or performed in visit on 05/15/16  POCT HgB A1C  Result Value Ref Range   Hemoglobin A1C 8.8       Assessment & Plan:   Problem List Items Addressed This Visit    COPD with asthma ([place])    See A&P for AECOPD Continue maintenance Spiriva Advised to follow-up with Pulmonology if not improving sooner, concern with recurrent frequent exac, may need more advanced maintenance therapy and management      Relevant Medications   ipratropium-albuterol (DUONEB) 0.5-2.5 (3) MG/3ML nebulizer solution  3 mL   predniSONE (DELTASONE) 50 MG tablet   COPD exacerbation ([place]) - Primary    Consistent with early mild acute exacerbation of COPD with worsening productive cough, wheezing, dyspnea, likely secondary to allergies as trigger possible viral URI. Similar to prior exacerbations, last 05/2016 improved on prednisone burst and doxycycline. Prior COPD exac hospitalization 06/2015 - No hypoxia (96% on RA), afebrile - Continues Albuterol, Spiriva  Plan: 1. Start Prednisone 50mg  x 5 day steroid burst - hold chronic pred 10 daily for 5 days, then resume after finished burst 2. Use albuterol q 4 hr regularly x 2-3 days (neb vs inhaler). Continue maintenance inhaler Spiriva 3. Considered antibiotics, however afebrile and no hypoxia, would not be indicated in mild AECOPD, unless recurrent or not improved on steroids 4. Can notify office within 48 hours if not improved - given severity of prior flares, will phone in either Levaquin vs Doxycycline antibiotic, or sooner if develop return criteria symptoms 5. Follow-up within about 1 week if not improving, otherwise strict return criteria to go to ED      Relevant Medications   ipratropium-albuterol (DUONEB) 0.5-2.5 (3) MG/3ML nebulizer solution 3 mL   predniSONE (DELTASONE) 50 MG tablet   Allergic rhinitis due to allergen    Likely trigger for current COPD flare. Continue anti-histamine Likely need flonase or nasal steroid in future         Meds ordered this encounter  Medications  . ipratropium-albuterol (DUONEB) 0.5-2.5 (3) MG/3ML nebulizer solution 3 mL  . predniSONE (DELTASONE) 50 MG tablet    Sig: Take 1 tablet (50 mg total) by mouth daily with breakfast. Stop taking Prednisone 10mg  daily while on this dose prednisone    Dispense:  5 tablet    Refill:  0      Follow up plan: Return in about 2 weeks (around 08/14/2016), or if symptoms worsen or fail to improve, for COPD.   [name], [facility] 07/31/2016, 5:43 PM

## 2016-07-31 NOTE — Assessment & Plan Note (Signed)
Likely trigger for current COPD flare. Continue anti-histamine Likely need flonase or nasal steroid in future

## 2016-07-31 NOTE — Assessment & Plan Note (Signed)
Consistent with early mild acute exacerbation of COPD with worsening productive cough, wheezing, dyspnea, likely secondary to allergies as trigger possible viral URI. Similar to prior exacerbations, last 05/2016 improved on prednisone burst and doxycycline. Prior COPD exac hospitalization 06/2015 - No hypoxia (96% on RA), afebrile - Continues Albuterol, Spiriva  Plan: 1. Start Prednisone 50mg  x 5 day steroid burst - hold chronic pred 10 daily for 5 days, then resume after finished burst 2. Use albuterol q 4 hr regularly x 2-3 days (neb vs inhaler). Continue maintenance inhaler Spiriva 3. Considered antibiotics, however afebrile and no hypoxia, would not be indicated in mild AECOPD, unless recurrent or not improved on steroids 4. Can notify office within 48 hours if not improved - given severity of prior flares, will phone in either Levaquin vs Doxycycline antibiotic, or sooner if develop return criteria symptoms 5. Follow-up within about 1 week if not improving, otherwise strict return criteria to go to ED

## 2016-07-31 NOTE — Patient Instructions (Signed)
Thank you for coming in to clinic today.  1. COPD Exacerbation with wheezing, likely triggered by allergies or other illness, I do not hear evidence of pneumonia - Start Prednisone 50mg  daily for next 5 days - this will open up lungs allow you to breath better and treat that wheezing or bronchospasm - HOLD Prednisone 10mg  daily - RESUME this once finished with higher dose - Use Albuterol inhaler or Albuterol Nebulizer 2 puffs or 1 neb dose every 4-6 hours around the clock for next 2-3 days, max up to 5 days then use as needed - Start OTC Mucinex-DM for 1 week or less, to help clear the mucus - Continue Zyrtec daily - Drink plenty of fluids to improve congestion  WITHIN 48 HOURS IF NOT IMPROVED - Call office, and we will add an antibiotic either Levaquin or Doxycycline  If your symptoms seem to worsen instead of improve over next several days, including significant fever / chills, worsening shortness of breath, worsening wheezing, or nausea / vomiting and can't take medicines - return sooner or go to hospital Emergency Department for more immediate treatment.  Please schedule a follow-up appointment with Dr. [name] in 1 week as needed for COPD  If you have any other questions or concerns, please feel free to call the clinic or send a message through [place]. You may also schedule an earlier appointment if necessary.  [name], DO [place]

## 2016-07-31 NOTE — Assessment & Plan Note (Signed)
See A&P for AECOPD Continue maintenance Spiriva Advised to follow-up with Pulmonology if not improving sooner, concern with recurrent frequent exac, may need more advanced maintenance therapy and management

## 2016-08-07 ENCOUNTER — Telehealth: Payer: Self-pay | Admitting: *Deleted

## 2016-08-07 ENCOUNTER — Ambulatory Visit: Payer: Medicare Other | Admitting: Nurse Practitioner

## 2016-08-07 DIAGNOSIS — J441 Chronic obstructive pulmonary disease with (acute) exacerbation: Secondary | ICD-10-CM

## 2016-08-07 MED ORDER — LEVOFLOXACIN 500 MG PO TABS: 500.0000 mg | ORAL_TABLET | Freq: Every day | ORAL | 0 refills | Status: DC

## 2016-08-07 MED ORDER — DOXYCYCLINE HYCLATE 100 MG PO TABS: 100.0000 mg | ORAL_TABLET | Freq: Two times a day (BID) | ORAL | 0 refills | Status: DC

## 2016-08-07 NOTE — Telephone Encounter (Signed)
Pharmacist called with significant contraindication to levaquin dosing for prolonged QTc.  Cancel levaquin prescription.  Use doxycycline instead.  Take 100 mg twice daily for 7 days.

## 2016-08-07 NOTE — Addendum Note (Signed)
Addended by: Cleaster Corin on: 08/07/2016 03:21 PM   Modules accepted: Orders

## 2016-08-07 NOTE — Telephone Encounter (Signed)
Per prior office visit with Dr.K, sent Levaquin prescription to pharmacy.  Take one levaquin 500 mg tablet daily for 10 days. If unchanged symptoms after 5 days or you have worsening symptoms with  fever and chills, call the office to make an appointment.  If symptoms are severe with difficulty breathing that is unresolved with your inhaler or nebulizer or seek care at urgent care or emergency room.

## 2016-08-07 NOTE — Telephone Encounter (Signed)
Please send antibiotics to pharmacy.

## 2016-08-08 DIAGNOSIS — G4733 Obstructive sleep apnea (adult) (pediatric): Secondary | ICD-10-CM | POA: Diagnosis not present

## 2016-08-11 DIAGNOSIS — R05 Cough: Secondary | ICD-10-CM | POA: Diagnosis not present

## 2016-08-11 DIAGNOSIS — R0982 Postnasal drip: Secondary | ICD-10-CM | POA: Diagnosis not present

## 2016-08-11 DIAGNOSIS — J449 Chronic obstructive pulmonary disease, unspecified: Secondary | ICD-10-CM | POA: Diagnosis not present

## 2016-08-11 DIAGNOSIS — J439 Emphysema, unspecified: Secondary | ICD-10-CM | POA: Diagnosis not present

## 2016-08-11 DIAGNOSIS — G4733 Obstructive sleep apnea (adult) (pediatric): Secondary | ICD-10-CM | POA: Diagnosis not present

## 2016-08-14 ENCOUNTER — Ambulatory Visit (INDEPENDENT_AMBULATORY_CARE_PROVIDER_SITE_OTHER): Payer: Medicare Other | Admitting: Family Medicine

## 2016-08-14 ENCOUNTER — Ambulatory Visit: Payer: Medicare Other | Admitting: Family Medicine

## 2016-08-14 ENCOUNTER — Encounter: Payer: Self-pay | Admitting: Family Medicine

## 2016-08-14 VITALS — BP 130/81 | HR 91 | Temp 98.1°F | Resp 16 | Ht 71.5 in | Wt 225.0 lb

## 2016-08-14 DIAGNOSIS — E1142 Type 2 diabetes mellitus with diabetic polyneuropathy: Secondary | ICD-10-CM | POA: Diagnosis not present

## 2016-08-14 DIAGNOSIS — B351 Tinea unguium: Secondary | ICD-10-CM

## 2016-08-14 LAB — POCT GLYCOSYLATED HEMOGLOBIN (HGB A1C): Hemoglobin A1C: 8

## 2016-08-14 NOTE — Assessment & Plan Note (Signed)
Uncomplicated L great toenail onychomycosis in setting of Diabetic patient - Encouraged to continue using OTC topical anti-fungal for this isolated spot for now up to 6 weeks if not improving can extend up to 12 weeks or return or notify office and we can try oral therapy Terbinafine 250mg  daily x 6-12 weeks

## 2016-08-14 NOTE — Assessment & Plan Note (Signed)
Improved control A1c 8.0 down from 8.8 in 05/2016, since on insulin, never able to start novolog mealtime due to insurance preference, patient unsure which med was preferred. - Complicated by neuropathy, well controlled on gabapentin  Plan: 1. Continue current regimen - Lantus 12u nightly - titration increase instructions given if CBG >150 consistently can work up to 15u max, up by 1 weekly 2. Continue Metformin 1000mg  BID, januvia 100mg  daily 3. Will NOT start mealtime insulin at this time 4. Improve lifestyle - DM diet and regular exercise 5. Normal DM foot exam today 6. Follow-up 3 months for DM A1c

## 2016-08-14 NOTE — Progress Notes (Signed)
Subjective:    Patient ID: Philip Husbands., male    DOB: [birth year], 71 y.o.   MRN: [id number]  Philip WAINWRIGHT Sr. is a 71 y.o. male presenting on 08/14/2016 for Diabetes   HPI  CHRONIC DM, Type 2: Reports he recently started on insulin within past 3-6 months, believes around January 2018 started on these by prior PCP. He was unable to get Novolog due to insurance coverage. He has been taking Prednisone 10mg  daily for past 6 months per Pulmonology, just finished recent prednisone burst and today is last day of Doxycycline from recent respiratory illness. He is breathing much better today. CBGs: Avg 180-220, Low 97, High 400. Checks CBGs 3 times daily Meds: Metformin 1000mg  twice daily (500mg  tabs), Januvia 100mg  daily, Lantus 12 units nightly Reports good compliance. Tolerating well w/o side-effects Currently on ARB Lifestyle: - on Statin - Has not implemented diet changes yet for lower carb, and not exercising regularly yet but plans to make these changes, walking a lot with work Denies hypoglycemia, polyuria, visual changes, numbness or tingling.  CONSTIPATION / Mixed Symptoms - Reports some mixed symptoms with sometimes not emptying bowels as appropriate, may have some constipation, he has often had urge to have BM with every bathroom trip even voiding, and usually only small amount of stool comes out but he has urge to have BM. Has tried miralax in the past, will try this again. - Denies abdominal pain, nausea, vomiting, diarrhea, blood in stool rectal bleeding or dark stool  Onycomycosis Left great toenail Reports new problem onset few weeks ago, has just started putting topical anti fungal medicine on this toenail 1 week ago, will try for 6 weeks according to instructions. Does not have any other involved toenails - Denies any redness, swelling, pain, drainage, fevers   Social History  Substance Use Topics  . Smoking status: Former Smoker    Packs/day: 1.00    Years: 8.00      Types: Cigarettes    Quit date: 12/31/1967  . Smokeless tobacco: Never Used  . Alcohol use No     Comment: 05/06/2012 "last alcohol several years ago; never had problem wit"    Review of Systems Per HPI unless specifically indicated above     Objective:    BP 130/81   Pulse 91   Temp 98.1 F (36.7 C) (Oral)   Resp 16   Ht 5' 11.5" (1.816 m)   Wt 225 lb (102.1 kg)   SpO2 97%   BMI 30.94 kg/m   Wt Readings from Last 3 Encounters:  08/14/16 225 lb (102.1 kg)  07/31/16 232 lb (105.2 kg)  05/15/16 221 lb (100.2 kg)    Physical Exam  Constitutional: He is oriented to person, place, and time. He appears well-developed and well-nourished. No distress.  Well-appearing, comfortable, cooperative  HENT:  Head: Normocephalic and atraumatic.  Eyes: Conjunctivae are normal.  Neck: Normal range of motion. Neck supple. No thyromegaly present.  Cardiovascular: Normal rate, regular rhythm, normal heart sounds and intact distal pulses.   No murmur heard. Pulmonary/Chest: Effort normal and breath sounds normal. No respiratory distress. He has no wheezes. He has no rales.  Improved air movement  Musculoskeletal: He exhibits no edema.  Lymphadenopathy:    He has no cervical adenopathy.  Neurological: He is alert and oriented to person, place, and time.  Skin: Skin is warm and dry. No rash noted. He is not diaphoretic. No erythema.  Left great toenail with thickening appearance  consistent with Onychomycosis, without erythema, swelling, drainage ,or tenderness, no other involved toenails  Psychiatric: He has a normal mood and affect. His behavior is normal.  Nursing note and vitals reviewed.  Results for orders placed or performed in visit on 08/14/16  POCT HgB A1C  Result Value Ref Range   Hemoglobin A1C 8     Diabetic Foot Exam - Simple   Simple Foot Form Diabetic Foot exam was performed with the following findings:  Yes 08/14/2016  9:03 AM  Visual Inspection No deformities, no  ulcerations, no other skin breakdown bilaterally:  Yes See comments:  Yes Sensation Testing Intact to touch and monofilament testing bilaterally:  Yes Pulse Check Posterior Tibialis and Dorsalis pulse intact bilaterally:  Yes Comments Left great toenail onychomycosis with thickening, no complication       Assessment & Plan:   Problem List Items Addressed This Visit    Type 2 diabetes mellitus with peripheral neuropathy ([place]) - Primary    Improved control A1c 8.0 down from 8.8 in 05/2016, since on insulin, never able to start novolog mealtime due to insurance preference, patient unsure which med was preferred. - Complicated by neuropathy, well controlled on gabapentin  Plan: 1. Continue current regimen - Lantus 12u nightly - titration increase instructions given if CBG >150 consistently can work up to 15u max, up by 1 weekly 2. Continue Metformin 1000mg  BID, januvia 100mg  daily 3. Will NOT start mealtime insulin at this time 4. Improve lifestyle - DM diet and regular exercise 5. Normal DM foot exam today 6. Follow-up 3 months for DM A1c      Relevant Orders   POCT HgB A1C (Completed)   Onychomycosis of left great toe    Uncomplicated L great toenail onychomycosis in setting of Diabetic patient - Encouraged to continue using OTC topical anti-fungal for this isolated spot for now up to 6 weeks if not improving can extend up to 12 weeks or return or notify office and we can try oral therapy Terbinafine 250mg  daily x 6-12 weeks         Constipation / Incomplete emptying: - Without history of fecal incontinence or other GI red flags - Recommend to try Miralax regularly for few days to week or PRN help clear excess stool out and more complete emptying - Avoid straining - Follow-up as needed   Follow up plan: Return in about 3 months (around 11/13/2016) for diabetes.  Next visit after that is in 11/2016 for Annual Physical will need fasting labs.  [name], [facility] 08/14/2016, 1:00 PM

## 2016-08-14 NOTE — Patient Instructions (Signed)
Thank you for coming in to clinic today.  1. A1c down from 8.8 down to 8.0 today - Try to work on improving lifestyle with lower carb diet and some regular exercise to control the sugar - No medication change - Continue Lantus 12 units nightly for now - in future if fasting AM sugar is consistently >150 then you may increase Lantus by 1 unit up to max 15 units for now, notify office if need to go on higher dose - Hold meal time insulin for now  May try Miralax for helping clean out stools  Keep using topical anti fungal if we need to switch to pill we can  Please schedule a follow-up appointment with Dr. [name] in 3 months for Diabetes A1c  If you have any other questions or concerns, please feel free to call the clinic or send a message through [place]. You may also schedule an earlier appointment if necessary.  Philip Putnam, DO [place]

## 2016-08-16 ENCOUNTER — Telehealth: Payer: Self-pay | Admitting: *Deleted

## 2016-08-16 NOTE — Telephone Encounter (Signed)
Patient's insurance preferred method of insulin is Humalog Kwikpen.

## 2016-08-25 ENCOUNTER — Encounter: Payer: Medicare Other | Admitting: Family Medicine

## 2016-08-28 ENCOUNTER — Ambulatory Visit: Payer: Medicare Other | Admitting: Cardiovascular Disease

## 2016-08-28 ENCOUNTER — Encounter: Payer: Self-pay | Admitting: Cardiovascular Disease

## 2016-09-04 ENCOUNTER — Telehealth: Payer: Self-pay | Admitting: Family Medicine

## 2016-09-04 NOTE — Telephone Encounter (Signed)
Pt's insurance will not pay for novolog but will pay for hemolog.  His call back number is [phone]

## 2016-09-05 ENCOUNTER — Other Ambulatory Visit: Payer: Self-pay

## 2016-09-05 NOTE — Telephone Encounter (Signed)
I was able to speak to Mr. Minks and he said the best number to call him at is [phone].  I did tell him that his voicemail box had not been set up. Laverda Sorenson was also able to speak to him and he has been advised of current treatment plan.

## 2016-09-05 NOTE — Telephone Encounter (Signed)
Called patient and left message and called pharmacy novolog has $ 45 co pay and Humalog covers.

## 2016-09-05 NOTE — Telephone Encounter (Signed)
Pharmacy requesting refill Last ov  08/14/16 Last filled 04/09/16

## 2016-09-05 NOTE — Telephone Encounter (Signed)
Attempted to call patient back several times, cannot reach him. I have not been able to reach him at all, we tried calling back in 07/2016 as well. I tried # [phone], and it is only voicemail but does not identify patient.  His chart demographics also list the following numbers:  [phone] (home) - the voicemail box is not set up  [phone] - again, voicemail only but it does not identify the patient.  -----------------------------------------------------------  I last saw patient in office on 08/14/16, as discussed and documented in office visit and in other telephone calls. He does NOT need to take any mealtime insulin. He was initially prescribed the Novolog by his previous PCP and he never started it. He is doing fine without mealtime insulin for now. He does not need to take it. I do not plan to prescribe him Humalog Dia Crawford which is covered by his insurance. We will discuss this at next visit if need to add it.  [name], [facility] 09/05/2016, 1:09 PM

## 2016-09-05 NOTE — Telephone Encounter (Signed)
Received request for refill Prednisone 10mg  daily. This was originally prescribed by patient's Pulmonology for COPD, I saw patient in 07/2016 for acute COPD exac and gave a short term temporary burst of prednisone, but he was then to resume his existing rx Prednisone 10mg  daily. ------------------------ Forwarding refill encounter to [facility] staff to contact patient's Pulmonologist Dr Raul Del [facility]) to address this chronic Prednisone refill, to determine dose and duration of this course.  [name], [facility] 09/05/2016, 9:38 PM

## 2016-09-05 NOTE — Telephone Encounter (Signed)
Patient advised as below. Patient verbalizes understanding and is in agreement with treatment plan.  

## 2016-09-19 ENCOUNTER — Ambulatory Visit (INDEPENDENT_AMBULATORY_CARE_PROVIDER_SITE_OTHER): Payer: Medicare Other

## 2016-09-19 VITALS — BP 122/74 | HR 58 | Temp 98.7°F | Resp 16 | Ht 72.0 in | Wt 230.4 lb

## 2016-09-19 DIAGNOSIS — Z0000 Encounter for general adult medical examination without abnormal findings: Secondary | ICD-10-CM | POA: Diagnosis not present

## 2016-09-19 NOTE — Patient Instructions (Signed)
Philip [name] , Thank you for taking time to come for your Medicare Wellness Visit. I appreciate your ongoing commitment to your health goals. Please review the following plan we discussed and let me know if I can assist you in the future.   Screening recommendations/referrals: Colonoscopy: Completed 11/29/2013, Due 11/2023 Recommended yearly ophthalmology/optometry visit for glaucoma screening and checkup Recommended yearly dental visit for hygiene and checkup  Vaccinations: Influenza vaccine: up to date, due 01/2017 Pneumococcal vaccine: completed series Tdap vaccine: up to date  Shingles vaccine: check with your insurance company for coverage.   Advanced directives: Advance directive discussed with you today. I have provided a copy for you to complete at home and have notarized. Once this is complete please bring a copy in to our office so we can scan it into your chart.  Conditions/risks identified: Recommend drinking 3-4 glasses of water a day.  Next appointment: Follow up with Dr. Raliegh Ip on 11/20/2016 at 8:20am. Follow up in one year for your annual wellness exam.   Preventive Care 65 Years and Older, Male Preventive care refers to lifestyle choices and visits with your health care provider that can promote health and wellness. What does preventive care include?  A yearly physical exam. This is also called an annual well check.  Dental exams once or twice a year.  Routine eye exams. Ask your health care provider how often you should have your eyes checked.  Personal lifestyle choices, including:  Daily care of your teeth and gums.  Regular physical activity.  Eating a healthy diet.  Avoiding tobacco and drug use.  Limiting alcohol use.  Practicing safe sex.  Taking low doses of aspirin every day.  Taking vitamin and mineral supplements as recommended by your health care provider. What happens during an annual well check? The services and screenings done by your health care  provider during your annual well check will depend on your age, overall health, lifestyle risk factors, and family history of disease. Counseling  Your health care provider may ask you questions about your:  Alcohol use.  Tobacco use.  Drug use.  Emotional well-being.  Home and relationship well-being.  Sexual activity.  Eating habits.  History of falls.  Memory and ability to understand (cognition).  Work and work Statistician. Screening  You may have the following tests or measurements:  Height, weight, and BMI.  Blood pressure.  Lipid and cholesterol levels. These may be checked every 5 years, or more frequently if you are over 72 years old.  Skin check.  Lung cancer screening. You may have this screening every year starting at age 49 if you have a 30-pack-year history of smoking and currently smoke or have quit within the past 15 years.  Fecal occult blood test (FOBT) of the stool. You may have this test every year starting at age 40.  Flexible sigmoidoscopy or colonoscopy. You may have a sigmoidoscopy every 5 years or a colonoscopy every 10 years starting at age 46.  Prostate cancer screening. Recommendations will vary depending on your family history and other risks.  Hepatitis C blood test.  Hepatitis B blood test.  Sexually transmitted disease (STD) testing.  Diabetes screening. This is done by checking your blood sugar (glucose) after you have not eaten for a while (fasting). You may have this done every 1-3 years.  Abdominal aortic aneurysm (AAA) screening. You may need this if you are a current or former smoker.  Osteoporosis. You may be screened starting at age 46 if  you are at high risk. Talk with your health care provider about your test results, treatment options, and if necessary, the need for more tests. Vaccines  Your health care provider may recommend certain vaccines, such as:  Influenza vaccine. This is recommended every year.  Tetanus,  diphtheria, and acellular pertussis (Tdap, Td) vaccine. You may need a Td booster every 10 years.  Zoster vaccine. You may need this after age 75.  Pneumococcal 13-valent conjugate (PCV13) vaccine. One dose is recommended after age 34.  Pneumococcal polysaccharide (PPSV23) vaccine. One dose is recommended after age 32. Talk to your health care provider about which screenings and vaccines you need and how often you need them. This information is not intended to replace advice given to you by your health care provider. Make sure you discuss any questions you have with your health care provider. Document Released: 05/14/2015 Document Revised: 01/05/2016 Document Reviewed: 02/16/2015 Elsevier Interactive Patient Education  2017 [place] Prevention in the Home Falls can cause injuries. They can happen to people of all ages. There are many things you can do to make your home safe and to help prevent falls. What can I do on the outside of my home?  Regularly fix the edges of walkways and driveways and fix any cracks.  Remove anything that might make you trip as you walk through a door, such as a raised step or threshold.  Trim any bushes or trees on the path to your home.  Use bright outdoor lighting.  Clear any walking paths of anything that might make someone trip, such as rocks or tools.  Regularly check to see if handrails are loose or broken. Make sure that both sides of any steps have handrails.  Any raised decks and porches should have guardrails on the edges.  Have any leaves, snow, or ice cleared regularly.  Use sand or salt on walking paths during winter.  Clean up any spills in your garage right away. This includes oil or grease spills. What can I do in the bathroom?  Use night lights.  Install grab bars by the toilet and in the tub and shower. Do not use towel bars as grab bars.  Use non-skid mats or decals in the tub or shower.  If you need to sit down in  the shower, use a plastic, non-slip stool.  Keep the floor dry. Clean up any water that spills on the floor as soon as it happens.  Remove soap buildup in the tub or shower regularly.  Attach bath mats securely with double-sided non-slip rug tape.  Do not have throw rugs and other things on the floor that can make you trip. What can I do in the bedroom?  Use night lights.  Make sure that you have a light by your bed that is easy to reach.  Do not use any sheets or blankets that are too big for your bed. They should not hang down onto the floor.  Have a firm chair that has side arms. You can use this for support while you get dressed.  Do not have throw rugs and other things on the floor that can make you trip. What can I do in the kitchen?  Clean up any spills right away.  Avoid walking on wet floors.  Keep items that you use a lot in easy-to-reach places.  If you need to reach something above you, use a strong step stool that has a grab bar.  Keep electrical cords  out of the way.  Do not use floor polish or wax that makes floors slippery. If you must use wax, use non-skid floor wax.  Do not have throw rugs and other things on the floor that can make you trip. What can I do with my stairs?  Do not leave any items on the stairs.  Make sure that there are handrails on both sides of the stairs and use them. Fix handrails that are broken or loose. Make sure that handrails are as long as the stairways.  Check any carpeting to make sure that it is firmly attached to the stairs. Fix any carpet that is loose or worn.  Avoid having throw rugs at the top or bottom of the stairs. If you do have throw rugs, attach them to the floor with carpet tape.  Make sure that you have a light switch at the top of the stairs and the bottom of the stairs. If you do not have them, ask someone to add them for you. What else can I do to help prevent falls?  Wear shoes that:  Do not have high  heels.  Have rubber bottoms.  Are comfortable and fit you well.  Are closed at the toe. Do not wear sandals.  If you use a stepladder:  Make sure that it is fully opened. Do not climb a closed stepladder.  Make sure that both sides of the stepladder are locked into place.  Ask someone to hold it for you, if possible.  Clearly mark and make sure that you can see:  Any grab bars or handrails.  First and last steps.  Where the edge of each step is.  Use tools that help you move around (mobility aids) if they are needed. These include:  Canes.  Walkers.  Scooters.  Crutches.  Turn on the lights when you go into a dark area. Replace any light bulbs as soon as they burn out.  Set up your furniture so you have a clear path. Avoid moving your furniture around.  If any of your floors are uneven, fix them.  If there are any pets around you, be aware of where they are.  Review your medicines with your doctor. Some medicines can make you feel dizzy. This can increase your chance of falling. Ask your doctor what other things that you can do to help prevent falls. This information is not intended to replace advice given to you by your health care provider. Make sure you discuss any questions you have with your health care provider. Document Released: 02/11/2009 Document Revised: 09/23/2015 Document Reviewed: 05/22/2014 Elsevier Interactive Patient Education  2017 [employer].

## 2016-09-19 NOTE — Progress Notes (Signed)
Subjective:   Philip MARAGH Sr. is a 71 y.o. male who presents for Medicare Annual/Subsequent preventive examination.  Review of Systems:   Cardiac Risk Factors include: advanced age (>41mn, >>53women);diabetes mellitus;hypertension;obesity (BMI >30kg/m2);male gender     Objective:    Vitals: BP 122/74 (BP Location: Right Arm, Patient Position: Sitting)   Pulse (!) 58   Temp 98.7 F (37.1 C)   Resp 16   Ht 6' (1.829 m)   Wt 230 lb 6.4 oz (104.5 kg)   BMI 31.25 kg/m   Body mass index is 31.25 kg/m.  Tobacco History  Smoking Status  . Former Smoker  . Packs/day: 1.00  . Years: 8.00  . Types: Cigarettes  . Quit date: 12/31/1967  Smokeless Tobacco  . Never Used     Counseling given: Not Answered   Past Medical History:  Diagnosis Date  . Arthritis    a. right knee  . Asthma   . 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 etiology.  .Marland KitchenCOPD (chronic obstructive pulmonary disease) ([facility]   . Coronary artery disease    a. 05/2012 Cath: severe 3VD-->CABG by Dr. GServando Snarein 05/2012 with LIMA to LAD, SVG to LCX & SVG to RPDA.  .Marland KitchenHistory of bronchitis   . Hypercholesteremia   . Hypertension   . Hypothyroidism   . Shingles   . Thyroid disease   . Type II diabetes mellitus ([facility]    Past Surgical History:  Procedure Laterality Date  . ANTERIOR CERVICAL DECOMP/DISCECTOMY FUSION  ~ 2009  . CARDIAC CATHETERIZATION  05/06/2012   Significant three-vessel coronary artery disease with normal ejection fraction  . CORONARY ARTERY BYPASS GRAFT  05/07/2012   Procedure: CORONARY ARTERY BYPASS GRAFTING (CABG);  Surgeon: EGrace Isaac MD;  [facility]  Service: Open Heart Surgery;  Laterality: N/A;  times three  . INTRAOPERATIVE TRANSESOPHAGEAL ECHOCARDIOGRAM  05/07/2012   Procedure: INTRAOPERATIVE TRANSESOPHAGEAL ECHOCARDIOGRAM;  Surgeon: EGrace Isaac MD;  [facility]  Service: Open Heart Surgery;  Laterality: N/A;  . KNEE ARTHROSCOPY  ~ 2000   "right" (05/06/2012)  .  KNEE SURGERY  2003   right  . NECK SURGERY  2008   Plate in neck   Family History  Problem Relation Age of Onset  . Heart disease Mother   . Clotting disorder Mother   . Hypertension Sister    History  Sexual Activity  . Sexual activity: Not Currently    Outpatient Encounter Prescriptions as of 09/19/2016  Medication Sig  . albuterol (PROVENTIL) (2.5 MG/3ML) 0.083% nebulizer solution Inhale 3 mLs into the lungs every 6 (six) hours as needed.  . Albuterol Sulfate 108 (90 Base) MCG/ACT AEPB Inhale 2 puffs into the lungs every 6 (six) hours as needed.  .Marland Kitchenamiodarone (PACERONE) 200 MG tablet TAKE ONE TABLET BY MOUTH TWICE DAILY  . atorvastatin (LIPITOR) 40 MG tablet TAKE ONE-HALF TABLET BY MOUTH AT BEDTIME  . cetirizine (ZYRTEC) 10 MG tablet Take 10 mg by mouth as needed for allergies.  .Marland KitchenELIQUIS 5 MG TABS tablet TAKE ONE TABLET BY MOUTH TWICE DAILY  . fluticasone (FLONASE) 50 MCG/ACT nasal spray Place into the nose.  . furosemide (LASIX) 40 MG tablet TAKE ONE TABLET BY MOUTH ONCE DAILY  . gabapentin (NEURONTIN) 300 MG capsule Take 1 capsule (300 mg total) by mouth 3 (three) times daily.  .Marland Kitchenglucose blood (ONE TOUCH ULTRA TEST) test strip 1 each by Other route 2 (two) times  daily. Use as instructed to check bloods sugar twice a day.  [name] glucose monitoring kit (FREESTYLE) monitoring kit 1 each by Does not apply route as needed for other.  . Insulin Glargine (LANTUS) 100 UNIT/ML Solostar Pen Inject 10 Units into the skin daily at 10 pm.  . LANCETS ULTRA THIN MISC 1 Device by Does not apply route 4 (four) times daily -  before meals and at bedtime.  [name] levothyroxine (SYNTHROID, LEVOTHROID) 88 MCG tablet Take 1 tablet (88 mcg total) by mouth daily.  [name] losartan (COZAAR) 100 MG tablet Take 1 tablet (100 mg total) by mouth daily.  . metFORMIN (GLUCOPHAGE) 500 MG tablet TAKE TWO TABLETS BY MOUTH TWICE DAILY WITH FOOD  . predniSONE (DELTASONE) 10 MG tablet Take 10 mg by mouth daily.  . sitaGLIPtin  (JANUVIA) 100 MG tablet Take 1 tablet (100 mg total) by mouth daily.  . montelukast (SINGULAIR) 10 MG tablet Take 10 mg by mouth at bedtime.   [name] PROAIR HFA 108 (90 Base) MCG/ACT inhaler INHALE TWO PUFFS BY MOUTH 4 TIMES DAILY AS NEEDED (Patient not taking: Reported on 09/19/2016)  . SYMBICORT 160-4.5 MCG/ACT inhaler   . tiotropium (SPIRIVA) 18 MCG inhalation capsule Place 18 mcg into inhaler and inhale daily.    No facility-administered encounter medications on file as of 09/19/2016.     Activities of Daily Living In your present state of health, do you have any difficulty performing the following activities: 09/19/2016 05/15/2016  Hearing? Y N  Vision? Y N  Difficulty concentrating or making decisions? N N  Walking or climbing stairs? N N  Dressing or bathing? N N  Doing errands, shopping? N N  Preparing Food and eating ? N -  Using the Toilet? N -  In the past six months, have you accidently leaked urine? N -  Do you have problems with loss of bowel control? N -  Managing your Medications? N -  Managing your Finances? N -  Housekeeping or managing your Housekeeping? N -  Some recent data might be hidden    Patient Care Team: [name], DO as PCP - General (Family Medicine) [name], MD as Consulting Physician (Cardiology)   Assessment:     Exercise Activities and Dietary recommendations Current Exercise Habits: The patient does not participate in regular exercise at present  Goals    . Weight < 200 lb (90.719 kg)          Pt wants to loose 30 pounds within year.      Fall Risk Fall Risk  09/19/2016 05/15/2016 08/17/2015 01/06/2015 12/10/2014  Falls in the past year? Yes Yes No No No  Number falls in past yr: 1 2 or more - - -  Injury with Fall? No No - - -   Depression Screen PHQ 2/9 Scores 09/19/2016 09/19/2016 05/15/2016 08/17/2015  PHQ - 2 Score 0 0 0 0  PHQ- 9 Score 1 - - -    Cognitive Function MMSE - Mini Mental State Exam 01/06/2015    Orientation to time 5  Orientation to Place 5  Registration 3  Attention/ Calculation 5  Recall 3  Language- name 2 objects 2  Language- repeat 1  Language- follow 3 step command 3  Language- read & follow direction 1  Write a sentence 1  Copy design 1  Total score 30        Immunization History  Administered Date(s) Administered  . Influenza, High Dose Seasonal PF 01/06/2015  .  Influenza-Unspecified 02/08/2014  . Pneumococcal Conjugate-13 01/02/2014  . Pneumococcal Polysaccharide-23 05/01/2012  . Tdap 05/01/2012   Screening Tests Health Maintenance  Topic Date Due  . Hepatitis C Screening  05/15/2017 (Originally 02/16/1946)  . INFLUENZA VACCINE  11/29/2016  . OPHTHALMOLOGY EXAM  01/13/2017  . HEMOGLOBIN A1C  02/13/2017  . FOOT EXAM  08/14/2017  . TETANUS/TDAP  05/01/2022  . COLONOSCOPY  11/30/2023  . PNA vac Low Risk Adult  Completed      Plan:  I have personally reviewed and addressed the Medicare Annual Wellness questionnaire and have noted the following in the patient's chart:  A. Medical and social history B. Use of alcohol, tobacco or illicit drugs  C. Current medications and supplements D. Functional ability and status E.  Nutritional status F.  Physical activity G. Advance directives H. List of other physicians I.  Hospitalizations, surgeries, and ER visits in previous 12 months J.  [place] such as hearing and vision if needed, cognitive and depression L. Referrals and appointments - 11/20/16 at 8:20am with Dr.K  In addition, I have reviewed and discussed with patient certain preventive protocols, quality metrics, and best practice recommendations. A written personalized care plan for preventive services as well as general preventive health recommendations were provided to patient.   Signed,  Tyler Aas, LPN Nurse Health Advisor   MD Recommendations: none

## 2016-10-05 DIAGNOSIS — R05 Cough: Secondary | ICD-10-CM | POA: Diagnosis not present

## 2016-10-05 DIAGNOSIS — J439 Emphysema, unspecified: Secondary | ICD-10-CM | POA: Diagnosis not present

## 2016-10-05 DIAGNOSIS — G4733 Obstructive sleep apnea (adult) (pediatric): Secondary | ICD-10-CM | POA: Diagnosis not present

## 2016-10-05 DIAGNOSIS — R0609 Other forms of dyspnea: Secondary | ICD-10-CM | POA: Diagnosis not present

## 2016-11-06 ENCOUNTER — Ambulatory Visit (INDEPENDENT_AMBULATORY_CARE_PROVIDER_SITE_OTHER): Payer: Medicare Other | Admitting: Cardiovascular Disease

## 2016-11-06 ENCOUNTER — Encounter: Payer: Self-pay | Admitting: Cardiovascular Disease

## 2016-11-06 VITALS — BP 133/76 | HR 67 | Ht 71.0 in | Wt 239.0 lb

## 2016-11-06 DIAGNOSIS — I2510 Atherosclerotic heart disease of native coronary artery without angina pectoris: Secondary | ICD-10-CM

## 2016-11-06 DIAGNOSIS — I480 Paroxysmal atrial fibrillation: Secondary | ICD-10-CM

## 2016-11-06 DIAGNOSIS — I471 Supraventricular tachycardia: Secondary | ICD-10-CM

## 2016-11-06 DIAGNOSIS — I10 Essential (primary) hypertension: Secondary | ICD-10-CM

## 2016-11-06 DIAGNOSIS — E785 Hyperlipidemia, unspecified: Secondary | ICD-10-CM

## 2016-11-06 NOTE — Progress Notes (Signed)
Cardiology Office Note   Date:  11/06/2016   ID:  Philip LOTHAMER Sr., DOB 09/30/1945, MRN [id number]  PCP:  [name], DO  Cardiologist:   [name], MD   Chief Complaint  Patient presents with  . OTHER    6 month f/u no complaints today. Meds reviewed verbally with pt.      History of Present Illness: Philip Safi. is a 71 y.o. male who presents for a follow-up visit regarding Coronary artery disease, paroxysmal atrial fibrillation and atrial tachycardia.   He is status post CABG in January of 2014.  He had postoperative atrial fibrillation which was treated with amiodarone.    He is known to have frequent PACs and atrial tachycardia with pauses.  In August 2016 he had suspected atrial fibrillation on his EKG and thus he was started on Eliquis. Most recent echocardiogram in March 2017 showed low normal LV systolic function with an EF of 50-55%, mildly dilated left atrium and no evidence of pulmonary hypertension. I referred the patient to Dr. Caryl Comes for evaluation of atrial fibrillation and atrial tachycardia. He reviewed the tracings with Dr. Rayann Heman and it was determined that the ablation was an option. However, nobody got in touch with the patient and he is now frustrated with this. The patient has been doing reasonably well from a cardiac standpoint. However, he had worsening pulmonary status over the last 6-12 months which required frequent treatment with prednisone. He sees Dr. Raul Del for COPD.    Past Medical History:  Diagnosis Date  . Arthritis    a. right knee  . Asthma   . 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 etiology.  [name] COPD (chronic obstructive pulmonary disease) ([place])   . Coronary artery disease    a. 05/2012 Cath: severe 3VD-->CABG by Dr. Servando Snare in 05/2012 with LIMA to LAD, SVG to LCX & SVG to RPDA.  [name] History of bronchitis   . Hypercholesteremia   . Hypertension   . Hypothyroidism   . Shingles   . Thyroid disease   . Type II  diabetes mellitus ([place])     Past Surgical History:  Procedure Laterality Date  . ANTERIOR CERVICAL DECOMP/DISCECTOMY FUSION  ~ 2009  . CARDIAC CATHETERIZATION  05/06/2012   Significant three-vessel coronary artery disease with normal ejection fraction  . CORONARY ARTERY BYPASS GRAFT  05/07/2012   Procedure: CORONARY ARTERY BYPASS GRAFTING (CABG);  Surgeon: [name], MD;  Location: [place];  Service: Open Heart Surgery;  Laterality: N/A;  times three  . INTRAOPERATIVE TRANSESOPHAGEAL ECHOCARDIOGRAM  05/07/2012   Procedure: INTRAOPERATIVE TRANSESOPHAGEAL ECHOCARDIOGRAM;  Surgeon: [name], MD;  Location: [place];  Service: Open Heart Surgery;  Laterality: N/A;  . KNEE ARTHROSCOPY  ~ 2000   "right" (05/06/2012)  . KNEE SURGERY  2003   right  . NECK SURGERY  2008   Plate in neck     Current Outpatient Prescriptions  Medication Sig Dispense Refill  . albuterol (PROVENTIL) (2.5 MG/3ML) 0.083% nebulizer solution Inhale 3 mLs into the lungs every 6 (six) hours as needed.    . Albuterol Sulfate 108 (90 Base) MCG/ACT AEPB Inhale 2 puffs into the lungs every 6 (six) hours as needed.    [name] amiodarone (PACERONE) 200 MG tablet TAKE ONE TABLET BY MOUTH TWICE DAILY (Patient taking differently: TAKE ONE TABLET BY MOUTH DAILY) 180 tablet 3  . atorvastatin (LIPITOR) 40 MG tablet TAKE ONE-HALF TABLET BY MOUTH  AT BEDTIME 90 tablet 2  . cetirizine (ZYRTEC) 10 MG tablet Take 10 mg by mouth as needed for allergies.    [name] ELIQUIS 5 MG TABS tablet TAKE ONE TABLET BY MOUTH TWICE DAILY 60 tablet 6  . fluticasone (FLONASE) 50 MCG/ACT nasal spray Place into the nose.    . furosemide (LASIX) 40 MG tablet TAKE ONE TABLET BY MOUTH ONCE DAILY 90 tablet 3  . gabapentin (NEURONTIN) 300 MG capsule Take 1 capsule (300 mg total) by mouth 3 (three) times daily. 270 capsule 3  . glucose blood (ONE TOUCH ULTRA TEST) test strip 1 each by Other route 2 (two) times daily. Use as instructed to check bloods sugar twice a day.  100 each 12  . glucose monitoring kit (FREESTYLE) monitoring kit 1 each by Does not apply route as needed for other. 1 each 0  . Insulin Glargine (LANTUS) 100 UNIT/ML Solostar Pen Inject 10 Units into the skin daily at 10 pm. 5 pen 11  . LANCETS ULTRA THIN MISC 1 Device by Does not apply route 4 (four) times daily -  before meals and at bedtime. 200 each 5  . levothyroxine (SYNTHROID, LEVOTHROID) 88 MCG tablet Take 1 tablet (88 mcg total) by mouth daily. 30 tablet 6  . losartan (COZAAR) 100 MG tablet Take 1 tablet (100 mg total) by mouth daily. 30 tablet 6  . metFORMIN (GLUCOPHAGE) 500 MG tablet TAKE TWO TABLETS BY MOUTH TWICE DAILY WITH FOOD 360 tablet 3  . montelukast (SINGULAIR) 10 MG tablet Take 10 mg by mouth at bedtime.     . predniSONE (DELTASONE) 10 MG tablet Take 10 mg by mouth daily.    [name] PROAIR HFA 108 (90 Base) MCG/ACT inhaler INHALE TWO PUFFS BY MOUTH 4 TIMES DAILY AS NEEDED 9 each 3  . sitaGLIPtin (JANUVIA) 100 MG tablet Take 1 tablet (100 mg total) by mouth daily. 30 tablet 6  . SYMBICORT 160-4.5 MCG/ACT inhaler     . tiotropium (SPIRIVA) 18 MCG inhalation capsule Place 18 mcg into inhaler and inhale daily.      No current facility-administered medications for this visit.     Allergies:   Iodine; Penicillins; Sulfa antibiotics; Iodine; Ivp dye [iodinated diagnostic agents]; Penicillins; and Sulfa antibiotics    Social History:  The patient  reports that he quit smoking about 48 years ago. His smoking use included Cigarettes. He has a 8.00 pack-year smoking history. He has never used smokeless tobacco. He reports that he does not drink alcohol or use drugs.   Family History:  The patient's family history includes Clotting disorder in his mother; Heart disease in his mother; Hypertension in his sister.    ROS:  Please see the history of present illness.   Otherwise, review of systems are positive for none.   All other systems are reviewed and negative.    PHYSICAL EXAM: VS:   BP 133/76 (BP Location: Left Arm, Patient Position: Sitting, Cuff Size: Large)   Pulse 67   Ht 5' 11"  (1.803 m)   Wt 239 lb (108.4 kg)   BMI 33.33 kg/m  , BMI Body mass index is 33.33 kg/m. GEN: Well nourished, well developed, in no acute distress  HEENT: normal  Neck: no JVD, carotid bruits, or masses Cardiac: Regular rate and rhythm; no murmurs, rubs, or gallops, +1 edema Respiratory: Lungs are clear, normal work of breathing GI: soft, nontender, nondistended, + BS MS: no deformity or atrophy  Skin: warm and dry, no rash Neuro:  Strength and sensation are intact Psych: euthymic mood, full affect   EKG:  EKG is ordered today. The ekg ordered today demonstrates sinus rhythm with sinus arrhythmia  Recent Labs: 12/06/2015: ALT 27; BUN 25; Creatinine, Ser 1.39; Hemoglobin 15.4; Platelets 233; Potassium 4.0; Sodium 143; TSH 6.380    Lipid Panel    Component Value Date/Time   CHOL 139 12/14/2014 0834   TRIG 127 12/14/2014 0834   HDL 43 12/14/2014 0834   CHOLHDL 3.2 12/14/2014 0834   LDLCALC 71 12/14/2014 0834      Wt Readings from Last 3 Encounters:  11/06/16 239 lb (108.4 kg)  09/19/16 230 lb 6.4 oz (104.5 kg)  08/14/16 225 lb (102.1 kg)        ASSESSMENT AND PLAN:   1. Paroxysmal atrial fibrillation: He is currently on anticoagulation with Eliquis.    2. Atrial tachycardia with pauses:   He is currently being treated with amiodarone. Given his pulmonary status, I don't think that amiodarone is a good long-term option. I'm going to refer him to Dr. Rayann Heman for evaluation of ablation.  3. Bilateral leg edema: Suspected chronic venous insufficiency. This is stable.  4. Coronary artery disease involving native coronary arteries without angina: He has no anginal symptoms.   5. Essential hypertension: Blood pressure is well controlled.  6. Hyperlipidemia: Continue treatment with atorvastatin. Most recent LDL was 71.   Disposition:   FU with me in 6  months  Signed,  [name], MD  11/06/2016 1:17 PM    [place]

## 2016-11-06 NOTE — Patient Instructions (Addendum)
Medication Instructions:  Your physician recommends that you continue on your current medications as directed. Please refer to the Current Medication list given to you today.   Labwork: none  Testing/Procedures: none  Follow-Up:  Your physician recommends that you schedule a new patient appointment for afib and atrial tachycardia with Dr. Thompson Grayer at the [facility] office. [place]. [place] (across from [facility]) [place] wants you to follow-up with Dr. Fletcher Anon in 6 months. You will receive a reminder letter in the mail two months in advance. If you don't receive a letter, please call our office to schedule the follow-up appointment.   Any Other Special Instructions Will Be Listed Below (If Applicable).     If you need a refill on your cardiac medications before your next appointment, please call your pharmacy.

## 2016-11-20 ENCOUNTER — Ambulatory Visit: Payer: Medicare Other | Admitting: Family Medicine

## 2016-12-04 ENCOUNTER — Ambulatory Visit (INDEPENDENT_AMBULATORY_CARE_PROVIDER_SITE_OTHER): Payer: Medicare Other | Admitting: Internal Medicine

## 2016-12-04 ENCOUNTER — Encounter: Payer: Self-pay | Admitting: Internal Medicine

## 2016-12-04 VITALS — BP 120/76 | HR 66 | Ht 71.0 in | Wt 235.0 lb

## 2016-12-04 DIAGNOSIS — I480 Paroxysmal atrial fibrillation: Secondary | ICD-10-CM | POA: Diagnosis not present

## 2016-12-04 DIAGNOSIS — I471 Supraventricular tachycardia: Secondary | ICD-10-CM | POA: Diagnosis not present

## 2016-12-04 NOTE — Patient Instructions (Signed)
Medication Instructions:   Your physician has recommended you make the following change in your medication:  1) Stop Amiodarone   Labwork: None ordered   Testing/Procedures: None ordered   Follow-Up: Your physician recommends that you schedule a follow-up appointment in: 3 months with Dr Rayann Heman   Any Other Special Instructions Will Be Listed Below (If Applicable).     If you need a refill on your cardiac medications before your next appointment, please call your pharmacy.

## 2016-12-04 NOTE — Progress Notes (Signed)
Electrophysiology Office Note   Date:  12/04/2016   ID:  Philip MILLETTE Sr., DOB 07-Dec-1945, MRN [id number]  PCP:  Philip Hauser, DO  Cardiologist:  Philip [name] Primary Electrophysiologist: Philip Philip [name]  Chief Complaint  Patient presents with  . Atrial Fibrillation     History of Present Illness: Philip [name]. is a 71 y.o. male who presents today for electrophysiology evaluation.   The patient has frequent salvos of ectopic atrial activity.  Though he carries a diagnosis of afib and is on anticoagulation, I primarily see only nonsustained ectopic atrial rhythms on ekgs.  He reports fatigue with this but otherwise feels that he tolerates it well.  He has been on amiodarone for several years.  He worries about the risks of this medicine.  He was evaluated by Philip Philip [name].  We review strips in 2017 and I had offered to see the patient to discuss ablation.  Per Philip Philip [name] note from 11/17. He was unable to contact the patient to arrange follow-up with me.  Philip [name] now refers for further EP opinions.  Today, he denies symptoms of palpitations, chest pain,  orthopnea, PND, lower extremity edema, claudication, dizziness, presyncope, syncope, bleeding, or neurologic sequela. The patient is tolerating medications without difficulties and is otherwise without complaint today.    Past Medical History:  Diagnosis Date  . Arthritis    a. right knee  . Asthma   . Atrial arrhythmia    salvos of nonsustained atach  . 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 etiology.  Philip [name] COPD (chronic obstructive pulmonary disease) ([place])   . Coronary artery disease    a. 05/2012 Cath: severe 3VD-->CABG by Philip. Servando Snare in 05/2012 with LIMA to LAD, SVG to LCX & SVG to RPDA.  Philip [name] History of bronchitis   . Hypercholesteremia   . Hypertension   . Hypothyroidism   . Shingles   . Thyroid disease   . Type II diabetes mellitus ([place])    Past Surgical History:  Procedure Laterality Date  . ANTERIOR CERVICAL  DECOMP/DISCECTOMY FUSION  ~ 2009  . CARDIAC CATHETERIZATION  05/06/2012   Significant three-vessel coronary artery disease with normal ejection fraction  . CORONARY ARTERY BYPASS GRAFT  05/07/2012   Procedure: CORONARY ARTERY BYPASS GRAFTING (CABG);  Surgeon: Philip Isaac, MD;  Location: [place];  Service: Open Heart Surgery;  Laterality: N/A;  times three  . INTRAOPERATIVE TRANSESOPHAGEAL ECHOCARDIOGRAM  05/07/2012   Procedure: INTRAOPERATIVE TRANSESOPHAGEAL ECHOCARDIOGRAM;  Surgeon: Philip Isaac, MD;  Location: [place];  Service: Open Heart Surgery;  Laterality: N/A;  . KNEE ARTHROSCOPY  ~ 2000   "right" (05/06/2012)  . KNEE SURGERY  2003   right  . NECK SURGERY  2008   Plate in neck     Current Outpatient Prescriptions  Medication Sig Dispense Refill  . albuterol (PROVENTIL) (2.5 MG/3ML) 0.083% nebulizer solution Inhale 3 mLs into the lungs every 6 (six) hours as needed.    . Albuterol Sulfate 108 (90 Base) MCG/ACT AEPB Inhale 2 puffs into the lungs every 6 (six) hours as needed.    Philip [name] amiodarone (PACERONE) 200 MG tablet TAKE ONE TABLET BY MOUTH TWICE DAILY (Patient taking differently: TAKE ONE TABLET BY MOUTH DAILY) 180 tablet 3  . atorvastatin (LIPITOR) 40 MG tablet TAKE ONE-HALF TABLET BY MOUTH AT BEDTIME 90 tablet 2  . cetirizine (ZYRTEC) 10 MG tablet Take 10 mg by mouth as needed for allergies.    Philip [name]  clindamycin (CLEOCIN) 300 MG capsule Take 300 mg by mouth 4 (four) times daily. For 10 days (started  11/30/16)    . ELIQUIS 5 MG TABS tablet TAKE ONE TABLET BY MOUTH TWICE DAILY 60 tablet 6  . fluticasone (FLONASE) 50 MCG/ACT nasal spray Place into the nose.    . furosemide (LASIX) 40 MG tablet TAKE ONE TABLET BY MOUTH ONCE DAILY 90 tablet 3  . gabapentin (NEURONTIN) 300 MG capsule Take 1 capsule (300 mg total) by mouth 3 (three) times daily. 270 capsule 3  . glucose blood (ONE TOUCH ULTRA TEST) test strip 1 each by Other route 2 (two) times daily. Use as instructed to check bloods sugar  twice a day. 100 each 12  . glucose monitoring kit (FREESTYLE) monitoring kit 1 each by Does not apply route as needed for other. 1 each 0  . Insulin Glargine (LANTUS) 100 UNIT/ML Solostar Pen Inject 10 Units into the skin daily at 10 pm. (Patient taking differently: Inject 12 Units into the skin daily at 10 pm. ) 5 pen 11  . LANCETS ULTRA THIN MISC 1 Device by Does not apply route 4 (four) times daily -  before meals and at bedtime. 200 each 5  . levothyroxine (SYNTHROID, LEVOTHROID) 88 MCG tablet Take 1 tablet (88 mcg total) by mouth daily. 30 tablet 6  . losartan (COZAAR) 100 MG tablet Take 1 tablet (100 mg total) by mouth daily. 30 tablet 6  . metFORMIN (GLUCOPHAGE) 500 MG tablet TAKE TWO TABLETS BY MOUTH TWICE DAILY WITH FOOD 360 tablet 3  . montelukast (SINGULAIR) 10 MG tablet Take 10 mg by mouth at bedtime.     . predniSONE (DELTASONE) 10 MG tablet Take 10 mg by mouth daily.    Philip [name] PROAIR HFA 108 (90 Base) MCG/ACT inhaler INHALE TWO PUFFS BY MOUTH 4 TIMES DAILY AS NEEDED 9 each 3  . sitaGLIPtin (JANUVIA) 100 MG tablet Take 1 tablet (100 mg total) by mouth daily. 30 tablet 6  . SYMBICORT 160-4.5 MCG/ACT inhaler     . tiotropium (SPIRIVA) 18 MCG inhalation capsule Place 18 mcg into inhaler and inhale daily.      No current facility-administered medications for this visit.     Allergies:   Iodine; Penicillins; Sulfa antibiotics; Iodine; Ivp dye [iodinated diagnostic agents]; Penicillins; and Sulfa antibiotics   Social History:  The patient  reports that he quit smoking about 48 years ago. His smoking use included Cigarettes. He has a 8.00 pack-year smoking history. He has never used smokeless tobacco. He reports that he does not drink alcohol or use drugs.   Family History:  The patient's  family history includes Clotting disorder in his mother; Heart disease in his mother; Hypertension in his sister.    ROS:  Please see the history of present illness.   All other systems are personally  reviewed and negative.    PHYSICAL EXAM: VS:  BP 120/76   Pulse 66   Ht _0  (1.803 m)   Wt 235 lb (106.6 kg)   SpO2 92%   BMI 32.78 kg/m  , BMI Body mass index is 32.78 kg/m. GEN: Well nourished, well developed, in no acute distress  HEENT: normal  Neck: no JVD, carotid bruits, or masses Cardiac: RRR; no murmurs, rubs, or gallops,no edema  Respiratory:  clear to auscultation bilaterally, normal work of breathing GI: soft, nontender, nondistended, + BS MS: no deformity or atrophy  Skin: warm and dry  Neuro:  Strength and sensation are  intact Psych: euthymic mood, full affect  EKG:  EKG is ordered today. The ekg ordered today is personally reviewed and shows sinus rhythm  Multiple prior ekgs are reviewed in epic and reveal salvos of atrial ectopic activity   Recent Labs: 12/06/2015: ALT 27; BUN 25; Creatinine, Ser 1.39; Hemoglobin 15.4; Platelets 233; Potassium 4.0; Sodium 143; TSH 6.380  personally reviewed   Lipid Panel     Component Value Date/Time   CHOL 139 12/14/2014 0834   TRIG 127 12/14/2014 0834   HDL 43 12/14/2014 0834   CHOLHDL 3.2 12/14/2014 0834   LDLCALC 71 12/14/2014 0834   personally reviewed   Wt Readings from Last 3 Encounters:  12/04/16 235 lb (106.6 kg)  11/06/16 239 lb (108.4 kg)  09/19/16 230 lb 6.4 oz (104.5 kg)      Other studies personally reviewed: Additional studies/ records that were reviewed today include: Philip Tyrell Antonio notes, Philip Aquilla Hacker notes, prior echo, prior ekgs  Review of the above records today demonstrates: as above   ASSESSMENT AND PLAN:  1.  Ectopic atrial activity Salvos of ectopic atrial activity, possibly arising from a pulmonary vein. Though he carries a diagnosis of afib, this appears to be very infrequent. Therapeutic strategies for his atrial arrhythmias including medicine and ablation were discussed in detail with the patient today. Risk, benefits, and alternatives to EP study and radiofrequency ablation were also  discussed in detail today.  He is not ready to proceed with ablation at this time.  He is minimally symptomatic but primarily concerned about risks of amiodarone. We will therefore stop amiodarone today He will return to follow-up with me in 3 months He will contact my office if he decides to proceed with ablation in the interim. Continue on eliquis  2. CAD No ischemic symptoms No changes today    Current medicines are reviewed at length with the patient today.   The patient does not have concerns regarding his medicines.  The following changes were made today:  none   Signed, Thompson Grayer, MD  12/04/2016 12:13 PM     [place] [address] [phone] (office) [phone] (fax)

## 2016-12-25 DIAGNOSIS — J439 Emphysema, unspecified: Secondary | ICD-10-CM | POA: Diagnosis not present

## 2016-12-25 DIAGNOSIS — G4733 Obstructive sleep apnea (adult) (pediatric): Secondary | ICD-10-CM | POA: Diagnosis not present

## 2016-12-25 DIAGNOSIS — R0609 Other forms of dyspnea: Secondary | ICD-10-CM | POA: Diagnosis not present

## 2016-12-26 ENCOUNTER — Other Ambulatory Visit: Payer: Self-pay

## 2016-12-26 MED ORDER — LOSARTAN POTASSIUM 100 MG PO TABS: 100.0000 mg | ORAL_TABLET | Freq: Every day | ORAL | 6 refills | Status: DC

## 2017-02-19 ENCOUNTER — Other Ambulatory Visit: Payer: Self-pay

## 2017-02-19 DIAGNOSIS — E1142 Type 2 diabetes mellitus with diabetic polyneuropathy: Secondary | ICD-10-CM

## 2017-02-19 DIAGNOSIS — E119 Type 2 diabetes mellitus without complications: Secondary | ICD-10-CM

## 2017-02-19 MED ORDER — SITAGLIPTIN PHOSPHATE 100 MG PO TABS: 100.0000 mg | ORAL_TABLET | Freq: Every day | ORAL | 6 refills | Status: DC

## 2017-02-19 MED ORDER — METFORMIN HCL 500 MG PO TABS: ORAL_TABLET | ORAL | 3 refills | Status: DC

## 2017-02-26 ENCOUNTER — Encounter: Payer: Self-pay | Admitting: *Deleted

## 2017-02-26 ENCOUNTER — Telehealth: Payer: Self-pay

## 2017-02-28 ENCOUNTER — Encounter: Payer: Self-pay | Admitting: Family Medicine

## 2017-02-28 ENCOUNTER — Other Ambulatory Visit: Payer: Self-pay | Admitting: Family Medicine

## 2017-02-28 ENCOUNTER — Ambulatory Visit (INDEPENDENT_AMBULATORY_CARE_PROVIDER_SITE_OTHER): Payer: Medicare Other | Admitting: Family Medicine

## 2017-02-28 VITALS — BP 136/73 | HR 97 | Temp 98.8°F | Resp 16 | Ht 71.0 in | Wt 237.0 lb

## 2017-02-28 DIAGNOSIS — I4719 Other supraventricular tachycardia: Secondary | ICD-10-CM

## 2017-02-28 DIAGNOSIS — I25810 Atherosclerosis of coronary artery bypass graft(s) without angina pectoris: Secondary | ICD-10-CM | POA: Diagnosis not present

## 2017-02-28 DIAGNOSIS — N182 Chronic kidney disease, stage 2 (mild): Secondary | ICD-10-CM | POA: Insufficient documentation

## 2017-02-28 DIAGNOSIS — E7800 Pure hypercholesterolemia, unspecified: Secondary | ICD-10-CM

## 2017-02-28 DIAGNOSIS — G4733 Obstructive sleep apnea (adult) (pediatric): Secondary | ICD-10-CM

## 2017-02-28 DIAGNOSIS — E1142 Type 2 diabetes mellitus with diabetic polyneuropathy: Secondary | ICD-10-CM

## 2017-02-28 DIAGNOSIS — N183 Chronic kidney disease, stage 3 unspecified: Secondary | ICD-10-CM

## 2017-02-28 DIAGNOSIS — I10 Essential (primary) hypertension: Secondary | ICD-10-CM

## 2017-02-28 DIAGNOSIS — Z125 Encounter for screening for malignant neoplasm of prostate: Secondary | ICD-10-CM

## 2017-02-28 DIAGNOSIS — Z23 Encounter for immunization: Secondary | ICD-10-CM | POA: Diagnosis not present

## 2017-02-28 DIAGNOSIS — I481 Persistent atrial fibrillation: Secondary | ICD-10-CM

## 2017-02-28 DIAGNOSIS — I480 Paroxysmal atrial fibrillation: Secondary | ICD-10-CM

## 2017-02-28 DIAGNOSIS — I471 Supraventricular tachycardia: Secondary | ICD-10-CM

## 2017-02-28 DIAGNOSIS — Z7901 Long term (current) use of anticoagulants: Secondary | ICD-10-CM | POA: Insufficient documentation

## 2017-02-28 DIAGNOSIS — I5030 Unspecified diastolic (congestive) heart failure: Secondary | ICD-10-CM | POA: Diagnosis not present

## 2017-02-28 DIAGNOSIS — E034 Atrophy of thyroid (acquired): Secondary | ICD-10-CM

## 2017-02-28 DIAGNOSIS — Z9989 Dependence on other enabling machines and devices: Secondary | ICD-10-CM | POA: Diagnosis not present

## 2017-02-28 DIAGNOSIS — I4819 Other persistent atrial fibrillation: Secondary | ICD-10-CM

## 2017-02-28 LAB — POCT GLYCOSYLATED HEMOGLOBIN (HGB A1C): HEMOGLOBIN A1C: 7.6 — AB (ref ?–5.6)

## 2017-02-28 NOTE — Assessment & Plan Note (Signed)
Improved DM control A1c down to 7.6 from 8 to 8.8 Complications - CKD-III, peripheral neuropathy  Plan:  1. Continue current therapy - Lantus 14u - titrate instructions up by 1 unit q week if fasting AM CBG >150 - Continue Metformin 1000mg  BID, Januvia 100mg  daily - Discussion today on alternative medications available such as GLP1 would be ideal given CAD, elevated Cr, weight gain, would STOP DPP4 januvia and switch to GLP1 if can get covered, asked him to check with ins, I would prefer Ozempic for reduce cardiovascular risk 2. Encourage improved lifestyle - low carb, low sugar diet, reduce portion size, continue improving regular exercise - handout given on glycemic index 3. Check CBG, bring log to next visit for review 4. Continue ARB, Statin 5. Advised to schedule DM ophtho exam, send record 6. Follow-up 3 months Annual + labs

## 2017-02-28 NOTE — Assessment & Plan Note (Signed)
Stable, without worsening or complication. Currently sinus without AFib, occasional PAC On chronic anticoagulation Eliquis Followed by [facility], also Dr Rayann Heman EP Remains off Amiodarone due to risk with pulm

## 2017-02-28 NOTE — Assessment & Plan Note (Signed)
Stable without angina S/p CABG 2014 Followed by [facility] On med management now

## 2017-02-28 NOTE — Assessment & Plan Note (Signed)
Well controlled, chronic OSA on CPAP - Good adherence to CPAP nightly - Continue current CPAP therapy, patient seems to be benefiting from therapy - Question regarding water level with humidifier, asked him to check with CPAP supplier may need upgrade in future

## 2017-02-28 NOTE — Assessment & Plan Note (Signed)
Stable, without worsening. Euvolemic Followed by [facility] 

## 2017-02-28 NOTE — Assessment & Plan Note (Signed)
Stable, without worsening or complication. Currently sinus without AFib, occasional PAC On chronic anticoagulation Eliquis Followed by [facility], also Dr Rayann Heman EP Remains off Amiodarone due to risk with pulm

## 2017-02-28 NOTE — Patient Instructions (Addendum)
Thank you for coming to the clinic today.  1.  A1c down to 7.6, recommend a goal of < 8.0 prefer closer to 7.0  Lantus 14 unit nightly - if fasting sugar in morning is >150 consistently can increase gradually by 1 unit every week, max dose is 20 units then call office  Continue Metformin for now  Also continue Januvia until we START NEW MEDICINE  ---------- Recommend to start taking Tylenol Extra Strength 500mg  tabs - take 1 to 2 tabs per dose (max 1000mg ) every 6-8 hours for pain (take regularly, don't skip a dose for next 7 days), max 24 hour daily dose is 6 tablets or 3000mg . In the future you can repeat the same everyday Tylenol course for 1-2 weeks at a time.   REDUCE or Stop Aleve  Call insurance find cost and coverage of the following  1. Ozempic (Semaglutide injection) - start 0.25mg  weekly for 4 weeks then increase to 0.5mg  weekly - This one has best benefit of weight loss and reducing Cardiovascular events  2. Bydureon BCise (Exenatide ER) - once weekly - this is my preference, very good medicine well tolerated, less side effects of nausea, upset stomach. No dose changes. Cost and coverage is the problem, but we may be able to get it with the coupon card  3. Trulicity (Dulaglutide) - once weekly - this is very good one, usually one of my top choices as well, two doses, 0.75 (likely we would start) and 1.5 max dose. We can use coupon card here too  4. Victoza (Liraglutide) - once DAILY - 3 dose changes 0.6, 1.2 and 1.8, side effects nausea, upset stomach higher on this one but it is still very effective medicine  DUE for FASTING BLOOD WORK (no food or drink after midnight before the lab appointment, only water or coffee without cream/sugar on the morning of)  SCHEDULE "Lab Only" visit in the morning at the clinic for lab draw in 3 MONTHS  - Make sure Lab Only appointment is at about 1 week before your next appointment, so that results will be available  For Lab Results, once  available within 2-3 days of blood draw, you can can log in to MyChart online to view your results and a brief explanation. Also, we can discuss results at next follow-up visit.  Please schedule a Follow-up Appointment to: Return in about 3 months (around 05/31/2017) for Annual Physical (f/u DM meds, arthritis).  If you have any other questions or concerns, please feel free to call the clinic or send a message through [place]. You may also schedule an earlier appointment if necessary.  Additionally, you may be receiving a survey about your experience at our clinic within a few days to 1 week by e-mail or mail. We value your feedback.  [name], DO [place]

## 2017-02-28 NOTE — Progress Notes (Signed)
Subjective:    Patient ID: Philip [name]., male    DOB: 08/05/1945, 71 y.o.   MRN: [id number]  ZACKARY MCKEONE Sr. is a 71 y.o. male presenting on 02/28/2017 for Diabetes (highest above 400 and lowest 94 pas per pt only once he has that lower number)   HPI   CHRONIC DM, Type 2: - Last visit with me 08/14/16, for same problem, treated with Lantus titration instructions otherwise continued meds, never started mealtime insulin, see prior notes for background information. - Interval update with he had done well with some lifestyle changes, however one setback within past 1 month developed cellulitis skin infection. He [place] the [facility]" and was given Clindamycin, had severe allergy reaction with hives rash and itching generalized, he was switched to other antibiotic and given steroids, since resolved, this occurred about 1 month ago, thinks this infection and medicine combination raised his sugar readings over past 1 month - Today patient reports pleased with actual lower A1c 7.6. He thinks maybe evening sugar readings were inaccurate due to checking too soon after eating CBGs: Avg 200, Low 94, High 400. Checks CBGs 2-3x daily (fasting AM and evening AFTER eating not 2 hours) Meds: Metformin 1000mg  twice daily (500mg  tabs), Januvia 100mg  daily, Lantus 14 (inc from 12) units nightly - Still taking Prednisone 10mg  daily from Pulmonology Reports good compliance. Tolerating well w/o side-effects Currently on ARB, on Statin Lifestyle: - Diet: trying to adhere to lower carb, still eats sweets, working on improving - Exercise: Walking at work, no other changes - Complicated by peripheral neuropathy and CKD-III Denies hypoglycemia, polyuria, visual changes, numbness or tingling.  History of OA/DJD, multiple joints / Knee Pain - Prior history of R knee surgery in 2003. Has followed with Dr Marry Guan in past at [facility] - He does take an occasional Aleve OTC 1-2 pills few times weekly, he  was unaware of the potential harm to kidneys, he is not taking Tylenol  OSA CPAP - Patient reports prior history of dx OSA and on CPAP for years, prior to treatment initial symptoms were snoring, daytime sleepiness and fatigue. Prior PSG completed in past. - Today reports that sleep apnea is well controlled. He uses the CPAP machine every night. Tolerates the machine well, and thinks that sleeps better with it and feels good - One new concern thinks his older machine is using more water for humidifier aspect, compared to his wife's newer machine, he will ask his supplier  Paroxysmal Atrial Fibrillation with history of Atrial tachycardia / CAD s/p CABG without angina - Followed by Cardiology [facility] [name], recent visit 10/2016, continued on chronic anticoagulation with Eliquis, referred to EP Dr Rayann Heman for eval of amiodarone concern with pulmonary disease. Saw Dr Rayann Heman on 12/04/16, Amiodarone was DC'd due to pulmonary risk, reassurance with infrequency of AFib, stopped amiodarone and considered future ablation in future - Without anginal symptoms  Health Maintenance: - Due for Flu Shot, will receive today   Depression screen [facility] 2/9 09/19/2016 09/19/2016 05/15/2016  Decreased Interest 0 0 0  Down, Depressed, Hopeless 0 0 0  PHQ - 2 Score 0 0 0  Altered sleeping 0 - -  Tired, decreased energy 1 - -  Change in appetite 0 - -  Feeling bad or failure about yourself  0 - -  Trouble concentrating 0 - -  Moving slowly or fidgety/restless 0 - -  Suicidal thoughts 0 - -  PHQ-9 Score 1 - -  Social History  Substance Use Topics  . Smoking status: Former Smoker    Packs/day: 1.00    Years: 8.00    Types: Cigarettes    Quit date: 12/31/1967  . Smokeless tobacco: Never Used  . Alcohol use No     Comment: 05/06/2012 "last alcohol several years ago; never had problem wit"    Review of Systems Per HPI unless specifically indicated above     Objective:    BP 136/73   Pulse 97   Temp 98.8 F  (37.1 C) (Oral)   Resp 16   Ht 5\' 11"  (1.803 m)   Wt 237 lb (107.5 kg)   BMI 33.05 kg/m   Wt Readings from Last 3 Encounters:  02/28/17 237 lb (107.5 kg)  12/04/16 235 lb (106.6 kg)  11/06/16 239 lb (108.4 kg)    Physical Exam  Constitutional: He is oriented to person, place, and time. He appears well-developed and well-nourished. No distress.  Well-appearing, comfortable, cooperative  HENT:  Head: Normocephalic and atraumatic.  Mouth/Throat: Oropharynx is clear and moist.  Eyes: Conjunctivae are normal. Right eye exhibits no discharge. Left eye exhibits no discharge.  Neck: Normal range of motion. Neck supple.  Cardiovascular: Normal rate, regular rhythm, normal heart sounds and intact distal pulses.   No murmur heard. No ectopy  Pulmonary/Chest: Effort normal and breath sounds normal. No respiratory distress. He has no wheezes. He has no rales.  Musculoskeletal: Normal range of motion. He exhibits no edema.  Neurological: He is alert and oriented to person, place, and time.  Skin: Skin is warm and dry. No rash noted. He is not diaphoretic. No erythema.  Psychiatric: He has a normal mood and affect. His behavior is normal.  Well groomed, good eye contact, normal speech and thoughts  Nursing note and vitals reviewed.   Past Surgical History:  Procedure Laterality Date  . ANTERIOR CERVICAL DECOMP/DISCECTOMY FUSION  ~ 2009  . CARDIAC CATHETERIZATION  05/06/2012   Significant three-vessel coronary artery disease with normal ejection fraction  . CORONARY ARTERY BYPASS GRAFT  05/07/2012   Procedure: CORONARY ARTERY BYPASS GRAFTING (CABG);  Surgeon: [name], MD;  Location: [place];  Service: Open Heart Surgery;  Laterality: N/A;  times three  . INTRAOPERATIVE TRANSESOPHAGEAL ECHOCARDIOGRAM  05/07/2012   Procedure: INTRAOPERATIVE TRANSESOPHAGEAL ECHOCARDIOGRAM;  Surgeon: [name], MD;  Location: [place];  Service: Open Heart Surgery;  Laterality: N/A;  . KNEE ARTHROSCOPY  ~  2000   "right" (05/06/2012)  . KNEE SURGERY  2003   right  . NECK SURGERY  2008   Plate in neck     Recent Labs  05/15/16 1044 08/14/16 0857 02/28/17 1417  HGBA1C 8.8 8 7.6*    Results for orders placed or performed in visit on 02/28/17  POCT HgB A1C  Result Value Ref Range   Hemoglobin A1C 7.6 (A) 5.6      Assessment & Plan:   Problem List Items Addressed This Visit    (HFpEF) heart failure with preserved ejection fraction (HCC)    Stable, without worsening. Euvolemic Followed by [facility] Cardiology      Atrial fibrillation ([place])    Stable, without worsening or complication. Currently sinus without AFib, occasional PAC On chronic anticoagulation Eliquis Followed by [facility] Cardiology, also Dr Rayann Heman EP Remains off Amiodarone due to risk with pulm      Atrial tachycardia ([place])    Stable, without worsening or complication. Currently sinus without AFib, occasional PAC On chronic anticoagulation Eliquis  Followed by [facility] Cardiology, also Dr Rayann Heman EP Remains off Amiodarone due to risk with pulm      Coronary artery disease    Stable without angina S/p CABG 2014 Followed by [facility] Cardiology On med management now      OSA on CPAP    Well controlled, chronic OSA on CPAP - Good adherence to CPAP nightly - Continue current CPAP therapy, patient seems to be benefiting from therapy - Question regarding water level with humidifier, asked him to check with CPAP supplier may need upgrade in future      Type 2 diabetes mellitus with peripheral neuropathy ([place]) - Primary    Improved DM control A1c down to 7.6 from 8 to 8.8 Complications - CKD-III, peripheral neuropathy  Plan:  1. Continue current therapy - Lantus 14u - titrate instructions up by 1 unit q week if fasting AM CBG >150 - Continue Metformin 1000mg  BID, Januvia 100mg  daily - Discussion today on alternative medications available such as GLP1 would be ideal given CAD, elevated Cr, weight gain, would STOP DPP4 januvia  and switch to GLP1 if can get covered, asked him to check with ins, I would prefer Ozempic for reduce cardiovascular risk 2. Encourage improved lifestyle - low carb, low sugar diet, reduce portion size, continue improving regular exercise - handout given on glycemic index 3. Check CBG, bring log to next visit for review 4. Continue ARB, Statin 5. Advised to schedule DM ophtho exam, send record 6. Follow-up 3 months Annual + labs      Relevant Orders   POCT HgB A1C (Completed)    Other Visit Diagnoses    Needs flu shot       Relevant Orders   Flu vaccine HIGH DOSE PF (Completed)       Follow up plan: Return in about 3 months (around 05/31/2017) for Annual Physical (f/u DM meds, arthritis).  [name], DO [facility] 02/28/2017, 6:01 PM

## 2017-03-02 NOTE — Telephone Encounter (Signed)
error 

## 2017-03-05 DIAGNOSIS — R0602 Shortness of breath: Secondary | ICD-10-CM | POA: Diagnosis not present

## 2017-03-05 DIAGNOSIS — J310 Chronic rhinitis: Secondary | ICD-10-CM | POA: Diagnosis not present

## 2017-03-05 DIAGNOSIS — R05 Cough: Secondary | ICD-10-CM | POA: Diagnosis not present

## 2017-03-05 DIAGNOSIS — R0609 Other forms of dyspnea: Secondary | ICD-10-CM | POA: Diagnosis not present

## 2017-03-05 DIAGNOSIS — J441 Chronic obstructive pulmonary disease with (acute) exacerbation: Secondary | ICD-10-CM | POA: Diagnosis not present

## 2017-03-12 ENCOUNTER — Encounter (INDEPENDENT_AMBULATORY_CARE_PROVIDER_SITE_OTHER): Payer: Self-pay

## 2017-03-12 ENCOUNTER — Ambulatory Visit: Payer: Medicare Other | Admitting: Internal Medicine

## 2017-03-12 ENCOUNTER — Encounter: Payer: Self-pay | Admitting: Internal Medicine

## 2017-03-12 VITALS — BP 118/70 | HR 66 | Ht 71.0 in | Wt 226.4 lb

## 2017-03-12 DIAGNOSIS — I4819 Other persistent atrial fibrillation: Secondary | ICD-10-CM

## 2017-03-12 DIAGNOSIS — I471 Supraventricular tachycardia: Secondary | ICD-10-CM

## 2017-03-12 DIAGNOSIS — I481 Persistent atrial fibrillation: Secondary | ICD-10-CM

## 2017-03-12 NOTE — Patient Instructions (Addendum)
Medication Instructions:  Your physician recommends that you continue on your current medications as directed. Please refer to the Current Medication list given to you today.  -- If you need a refill on your cardiac medications before your next appointment, please call your pharmacy. --  Labwork: None ordered  Testing/Procedures: None ordered  Follow-Up: Your physician wants you to follow-up as needed with Dr. Rayann Heman.     Thank you for choosing [facility]!!   Frederik Schmidt, RN [phone]  Any Other Special Instructions Will Be Listed Below (If Applicable).

## 2017-03-12 NOTE — Progress Notes (Signed)
PCP: [name], DO Primary Cardiologist: [name] Primary EP: Dr Jorje Guild Sr. is a 71 y.o. male who presents today for routine electrophysiology followup.  Since last being seen in our clinic, the patient reports doing very well.  His primary concern is with COPD. Denies any recent symptoms of arrhythmia.  Today, he denies symptoms of palpitations, chest pain,  lower extremity edema, dizziness, presyncope, or syncope.  The patient is otherwise without complaint today.   Past Medical History:  Diagnosis Date  . Arthritis    a. right knee  . Asthma   . Atrial arrhythmia    [place] of nonsustained atach  . 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 etiology.  [name] COPD (chronic obstructive pulmonary disease) ([place])   . Coronary artery disease    a. 05/2012 Cath: severe 3VD-->CABG by Dr. Servando Snare in 05/2012 with LIMA to LAD, SVG to LCX & SVG to RPDA.  [name] History of bronchitis   . Hypercholesteremia   . Hypertension   . Hypothyroidism   . Shingles   . Thyroid disease   . Type II diabetes mellitus ([place])    Past Surgical History:  Procedure Laterality Date  . ANTERIOR CERVICAL DECOMP/DISCECTOMY FUSION  ~ 2009  . CARDIAC CATHETERIZATION  05/06/2012   Significant three-vessel coronary artery disease with normal ejection fraction  . KNEE ARTHROSCOPY  ~ 2000   "right" (05/06/2012)  . KNEE SURGERY  2003   right  . NECK SURGERY  2008   Plate in neck    ROS- all systems are reviewed and negatives except as per HPI above  Current Outpatient Medications  Medication Sig Dispense Refill  . albuterol (PROVENTIL) (2.5 MG/3ML) 0.083% nebulizer solution Inhale 3 mLs into the lungs every 6 (six) hours as needed.    . Albuterol Sulfate 108 (90 Base) MCG/ACT AEPB Inhale 2 puffs into the lungs every 6 (six) hours as needed.    [name] atorvastatin (LIPITOR) 40 MG tablet TAKE ONE-HALF TABLET BY MOUTH AT BEDTIME 90 tablet 2  . cetirizine (ZYRTEC) 10 MG tablet Take 10 mg by mouth as  needed for allergies.    [name] ELIQUIS 5 MG TABS tablet TAKE ONE TABLET BY MOUTH TWICE DAILY 60 tablet 6  . fluticasone (FLONASE) 50 MCG/ACT nasal spray Place 2 sprays at bedtime into both nostrils.     . furosemide (LASIX) 40 MG tablet TAKE ONE TABLET BY MOUTH ONCE DAILY 90 tablet 3  . gabapentin (NEURONTIN) 100 MG capsule Take 100 mg 3 (three) times daily by mouth.    [name] glucose blood (ONE TOUCH ULTRA TEST) test strip 1 each by Other route 2 (two) times daily. Use as instructed to check bloods sugar twice a day. 100 each 12  . glucose monitoring kit (FREESTYLE) monitoring kit 1 each by Does not apply route as needed for other. 1 each 0  . HYDROcodone-homatropine (HYCODAN) 5-1.5 MG/5ML syrup Take 5 mLs every 6 (six) hours as needed by mouth. cough    . Insulin Glargine (LANTUS) 100 UNIT/ML Solostar Pen Inject 10 Units into the skin daily at 10 pm. 5 pen 11  . LANCETS ULTRA THIN MISC 1 Device by Does not apply route 4 (four) times daily -  before meals and at bedtime. 200 each 5  . levothyroxine (SYNTHROID, LEVOTHROID) 88 MCG tablet Take 1 tablet (88 mcg total) by mouth daily. 30 tablet 6  . losartan (COZAAR) 100 MG tablet Take 1 tablet (100  mg total) by mouth daily. 30 tablet 6  . metFORMIN (GLUCOPHAGE) 500 MG tablet TAKE TWO TABLETS BY MOUTH TWICE DAILY WITH FOOD 360 tablet 3  . montelukast (SINGULAIR) 10 MG tablet Take 10 mg by mouth at bedtime.     . predniSONE (DELTASONE) 10 MG tablet Take 10 mg by mouth daily.    . sitaGLIPtin (JANUVIA) 100 MG tablet Take 1 tablet (100 mg total) by mouth daily. 30 tablet 6  . SYMBICORT 160-4.5 MCG/ACT inhaler Inhale 2 puffs 2 (two) times daily into the lungs.      No current facility-administered medications for this visit.     Physical Exam: Vitals:   03/12/17 1208  BP: 118/70  Pulse: 66  SpO2: 95%  Weight: 226 lb 6.4 oz (102.7 kg)  Height: 5' 11"  (1.803 m)    GEN- The patient is well appearing, alert and oriented x 3 today.   Head- normocephalic,  atraumatic Eyes-  Sclera clear, conjunctiva pink Ears- hearing intact Oropharynx- clear Lungs- Clear to ausculation bilaterally, normal work of breathing Heart- Regular rate and rhythm, no murmurs, rubs or gallops, PMI not laterally displaced GI- soft, NT, ND, + BS Extremities- no clubbing, cyanosis, or edema  EKG tracing ordered today is personally reviewed and shows sinus rhythm 66 bpm, nonspecific ST/T changes  Assessment and Plan:  1. Ectopic atrial activity Stable No change required today I would not advise ablation unless symptoms develop.  2. CAD No ischemic symptoms, no changes today  Follow-up with [name] as scheduled I will see as needed going forward  Thompson Grayer MD, [facility] 03/12/2017 12:23 PM

## 2017-04-02 DIAGNOSIS — J439 Emphysema, unspecified: Secondary | ICD-10-CM | POA: Diagnosis not present

## 2017-04-28 IMAGING — CR DG CHEST 2V
1 series · 2 of 2 positions shown · non-contrast
Comparison: 04/06/2014

CLINICAL DATA: Cough since [REDACTED], productive at times. Shortness
of breath off and on since [REDACTED]. COPD.

EXAM:
CHEST  2 VIEW

[Series 1: pa · 0.17mm/px · 2 of 2 slices shown]
[im 1/2]
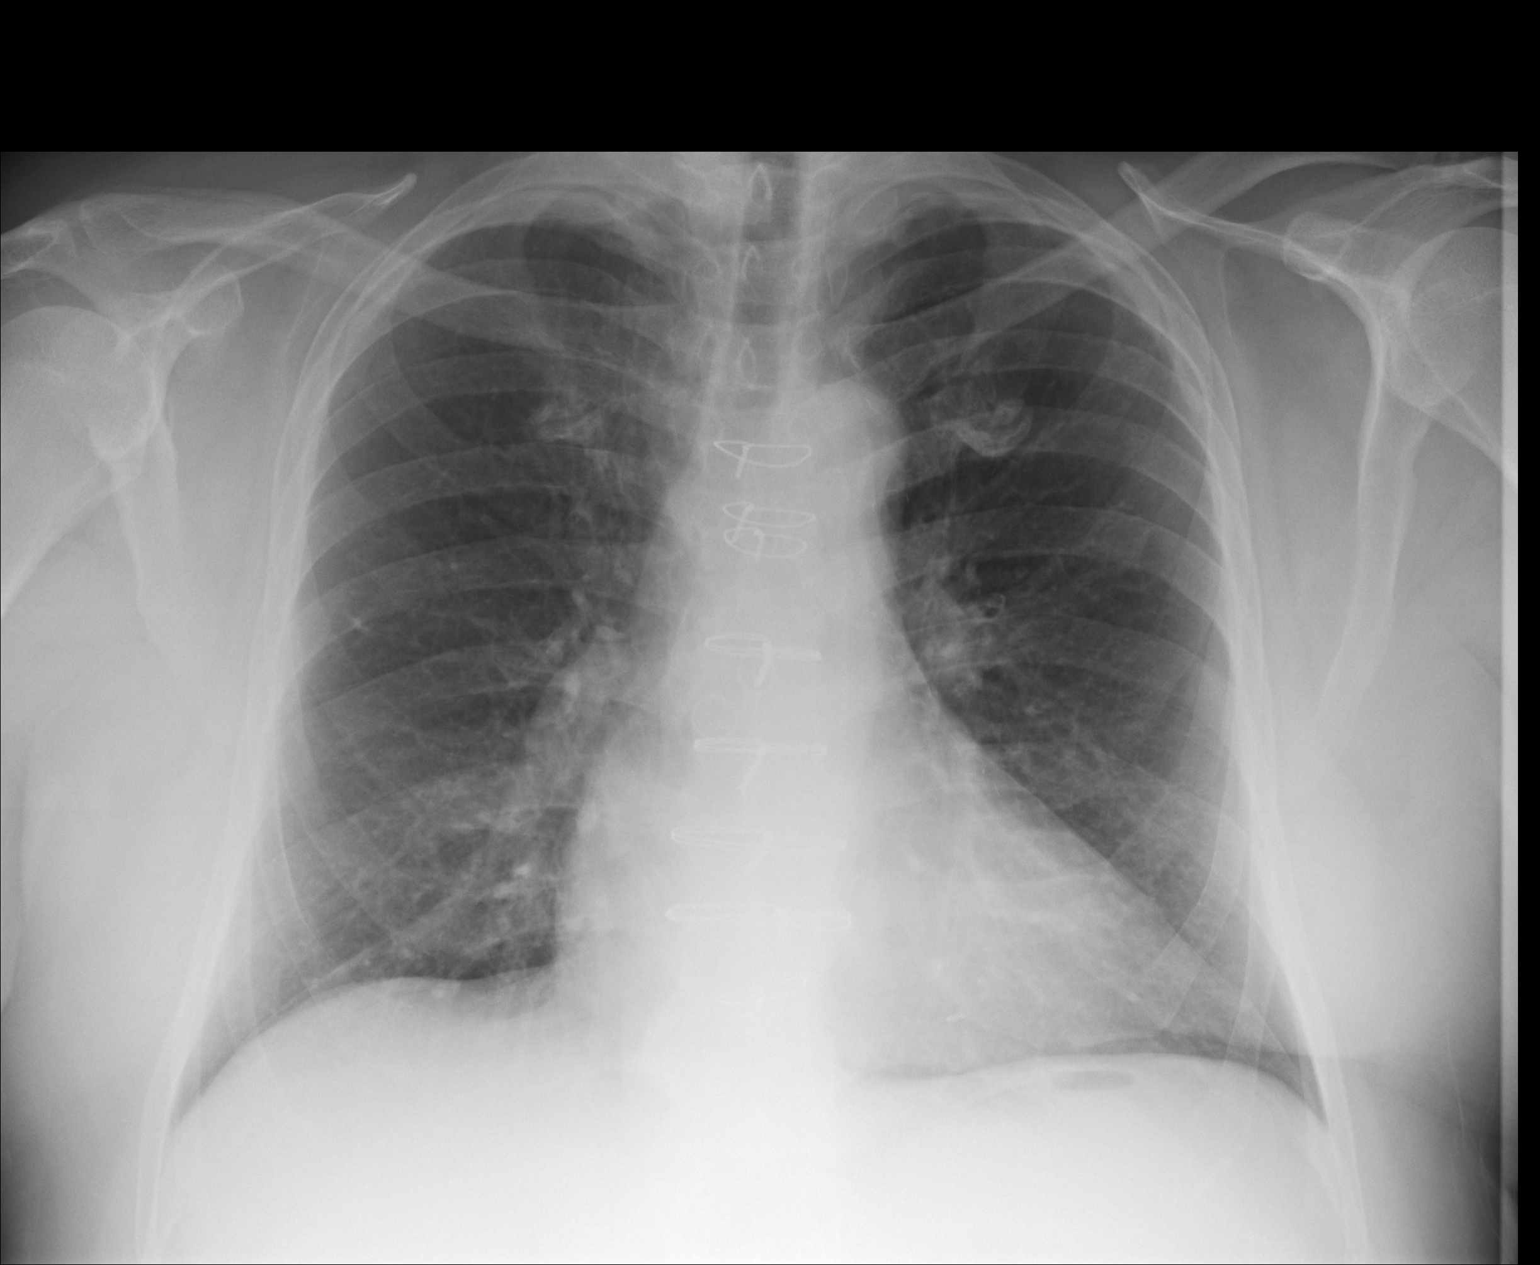
[im 2/2]
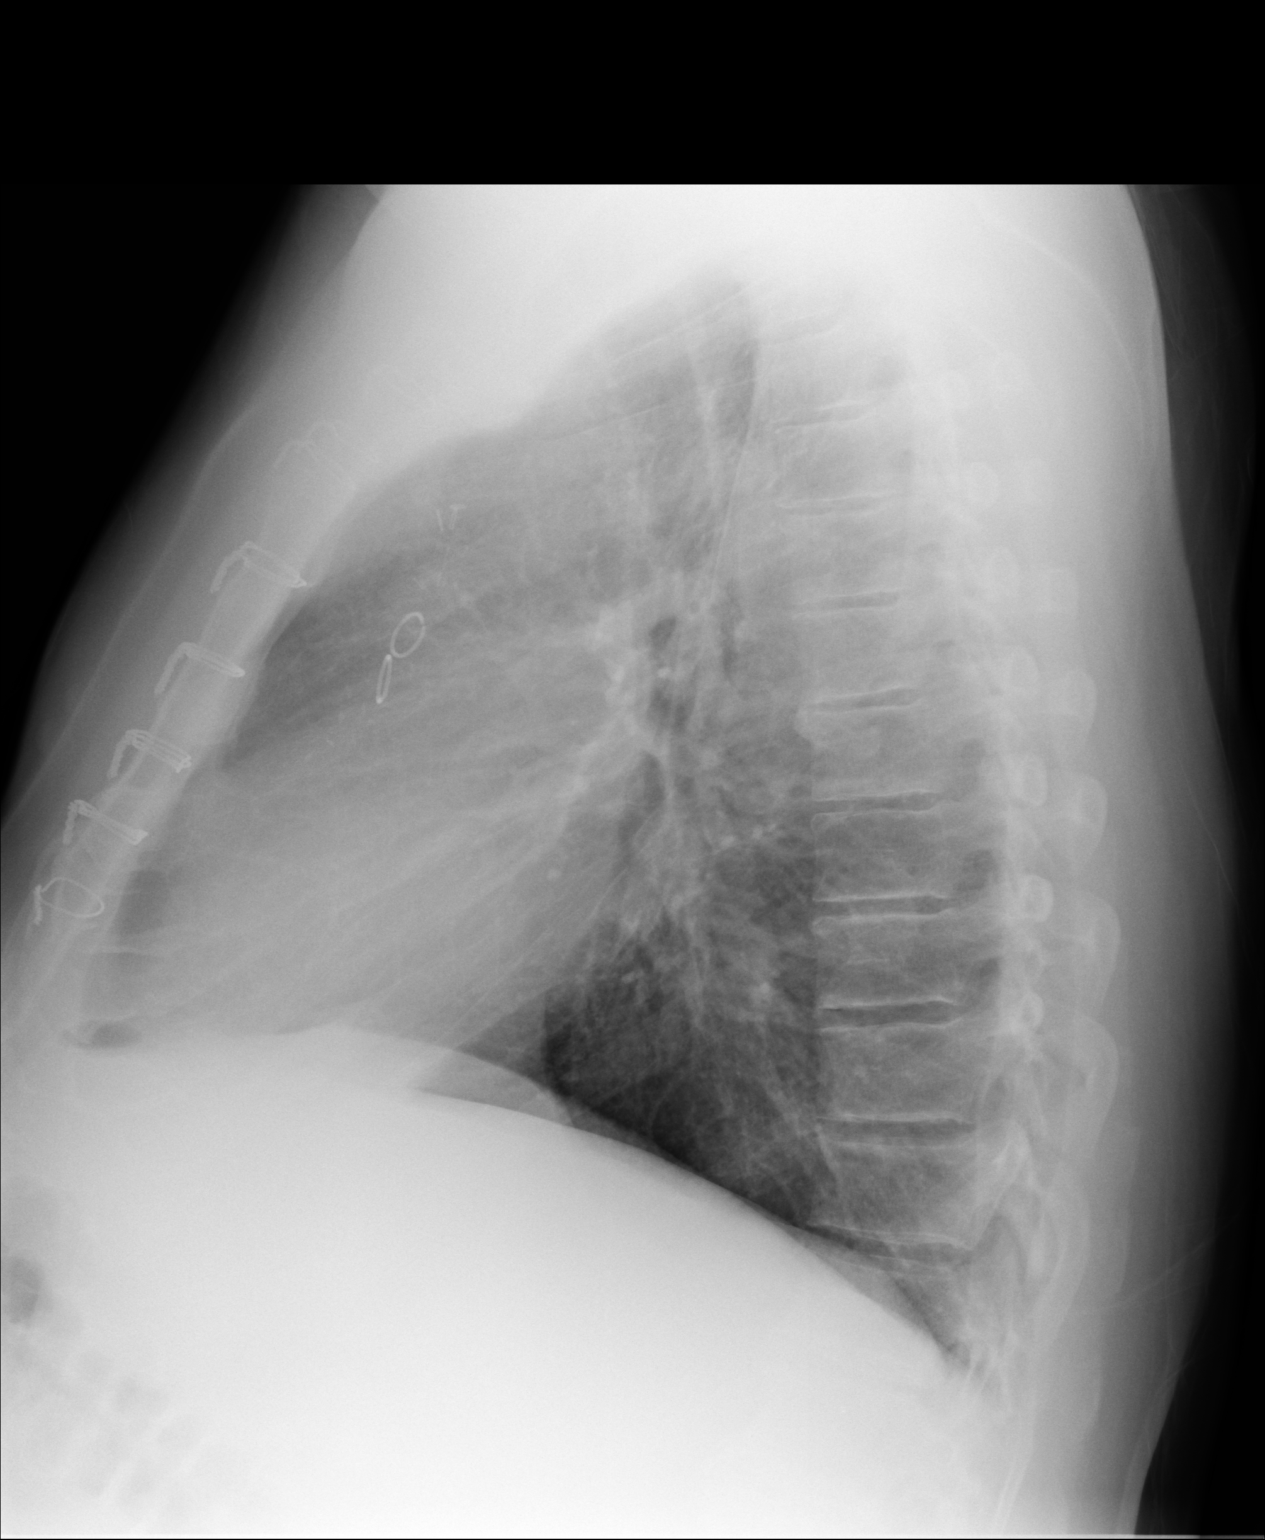

[2 of 2 positions shown; findings below may reference images not displayed]

FINDINGS: Prior CABG. Heart is mildly enlarged. Biapical pleural thickening,
stable. Otherwise lungs are clear. No effusions. No acute bony
abnormality.
IMPRESSION: No active cardiopulmonary disease.

## 2017-05-07 ENCOUNTER — Other Ambulatory Visit: Payer: Medicare Other

## 2017-05-07 DIAGNOSIS — R05 Cough: Secondary | ICD-10-CM | POA: Diagnosis not present

## 2017-05-07 DIAGNOSIS — E034 Atrophy of thyroid (acquired): Secondary | ICD-10-CM | POA: Diagnosis not present

## 2017-05-07 DIAGNOSIS — J439 Emphysema, unspecified: Secondary | ICD-10-CM | POA: Diagnosis not present

## 2017-05-07 DIAGNOSIS — Z7901 Long term (current) use of anticoagulants: Secondary | ICD-10-CM

## 2017-05-07 DIAGNOSIS — E1142 Type 2 diabetes mellitus with diabetic polyneuropathy: Secondary | ICD-10-CM | POA: Diagnosis not present

## 2017-05-07 DIAGNOSIS — I10 Essential (primary) hypertension: Secondary | ICD-10-CM

## 2017-05-07 DIAGNOSIS — G4733 Obstructive sleep apnea (adult) (pediatric): Secondary | ICD-10-CM | POA: Diagnosis not present

## 2017-05-07 DIAGNOSIS — E7800 Pure hypercholesterolemia, unspecified: Secondary | ICD-10-CM

## 2017-05-07 DIAGNOSIS — N183 Chronic kidney disease, stage 3 unspecified: Secondary | ICD-10-CM

## 2017-05-07 DIAGNOSIS — I25810 Atherosclerosis of coronary artery bypass graft(s) without angina pectoris: Secondary | ICD-10-CM

## 2017-05-07 DIAGNOSIS — J449 Chronic obstructive pulmonary disease, unspecified: Secondary | ICD-10-CM | POA: Diagnosis not present

## 2017-05-07 DIAGNOSIS — Z125 Encounter for screening for malignant neoplasm of prostate: Secondary | ICD-10-CM

## 2017-05-08 LAB — CBC WITH DIFFERENTIAL/PLATELET
BASOS ABS: 49 {cells}/uL (ref 0–200)
BASOS PCT: 0.6 %
Eosinophils Absolute: 113 cells/uL (ref 15–500)
Eosinophils Relative: 1.4 %
HEMATOCRIT: 41.8 % (ref 38.5–50.0)
Hemoglobin: 14.2 g/dL (ref 13.2–17.1)
LYMPHS ABS: 948 {cells}/uL (ref 850–3900)
MCH: 30.2 pg (ref 27.0–33.0)
MCHC: 34 g/dL (ref 32.0–36.0)
MCV: 88.9 fL (ref 80.0–100.0)
MPV: 11.4 fL (ref 7.5–12.5)
Monocytes Relative: 9.4 %
NEUTROS ABS: 6229 {cells}/uL (ref 1500–7800)
NEUTROS PCT: 76.9 %
PLATELETS: 184 10*3/uL (ref 140–400)
RBC: 4.7 10*6/uL (ref 4.20–5.80)
RDW: 12.8 % (ref 11.0–15.0)
Total Lymphocyte: 11.7 %
WBC mixed population: 761 cells/uL (ref 200–950)
WBC: 8.1 10*3/uL (ref 3.8–10.8)

## 2017-05-08 LAB — LIPID PANEL
CHOL/HDL RATIO: 2.4 (calc) (ref <5.0)
Cholesterol: 158 mg/dL (ref <200)
HDL: 67 mg/dL (ref >40)
LDL Cholesterol (Calc): 77 mg/dL (calc)
Non-HDL Cholesterol (Calc): 91 mg/dL (calc) (ref <130)
Triglycerides: 64 mg/dL (ref <150)

## 2017-05-08 LAB — COMPLETE METABOLIC PANEL WITH GFR
AG RATIO: 2.3 (calc) (ref 1.0–2.5)
ALBUMIN MSPROF: 4.1 g/dL (ref 3.6–5.1)
ALT: 15 U/L (ref 9–46)
AST: 11 U/L (ref 10–35)
Alkaline phosphatase (APISO): 84 U/L (ref 40–115)
BILIRUBIN TOTAL: 0.4 mg/dL (ref 0.2–1.2)
BUN: 24 mg/dL (ref 7–25)
CHLORIDE: 103 mmol/L (ref 98–110)
CO2: 31 mmol/L (ref 20–32)
Calcium: 9.1 mg/dL (ref 8.6–10.3)
Creat: 1.15 mg/dL (ref 0.70–1.18)
GFR, Est African American: 74 mL/min/{1.73_m2} (ref >=60)
GFR, Est Non African American: 64 mL/min/{1.73_m2} (ref >=60)
GLOBULIN: 1.8 g/dL — AB (ref 1.9–3.7)
Glucose, Bld: 186 mg/dL — ABNORMAL HIGH (ref 65–99)
POTASSIUM: 4.2 mmol/L (ref 3.5–5.3)
SODIUM: 141 mmol/L (ref 135–146)
TOTAL PROTEIN: 5.9 g/dL — AB (ref 6.1–8.1)

## 2017-05-08 LAB — TSH: TSH: 4.69 m[IU]/L — AB (ref 0.40–4.50)

## 2017-05-08 LAB — PSA, TOTAL WITH REFLEX TO PSA, FREE: PSA, TOTAL: 1.5 ng/mL (ref <=4.0)

## 2017-05-08 LAB — HEMOGLOBIN A1C
EAG (MMOL/L): 11.2 (calc)
Hgb A1c MFr Bld: 8.7 % of total Hgb — ABNORMAL HIGH (ref <5.7)
Mean Plasma Glucose: 203 (calc)

## 2017-05-08 LAB — T4, FREE: Free T4: 1.2 ng/dL (ref 0.8–1.8)

## 2017-05-09 ENCOUNTER — Other Ambulatory Visit: Payer: Medicare Other

## 2017-05-11 ENCOUNTER — Other Ambulatory Visit: Payer: Self-pay

## 2017-05-11 DIAGNOSIS — E034 Atrophy of thyroid (acquired): Secondary | ICD-10-CM

## 2017-05-11 MED ORDER — LEVOTHYROXINE SODIUM 88 MCG PO TABS: 88.0000 ug | ORAL_TABLET | Freq: Every day | ORAL | 6 refills | Status: DC

## 2017-05-14 ENCOUNTER — Encounter: Payer: Self-pay | Admitting: Family Medicine

## 2017-05-14 ENCOUNTER — Ambulatory Visit (INDEPENDENT_AMBULATORY_CARE_PROVIDER_SITE_OTHER): Payer: Medicare Other | Admitting: Family Medicine

## 2017-05-14 ENCOUNTER — Encounter: Payer: Medicare Other | Admitting: Family Medicine

## 2017-05-14 VITALS — BP 118/57 | HR 61 | Temp 98.4°F | Resp 16 | Ht 71.0 in | Wt 229.0 lb

## 2017-05-14 DIAGNOSIS — M25562 Pain in left knee: Secondary | ICD-10-CM

## 2017-05-14 DIAGNOSIS — I10 Essential (primary) hypertension: Secondary | ICD-10-CM | POA: Diagnosis not present

## 2017-05-14 DIAGNOSIS — N182 Chronic kidney disease, stage 2 (mild): Secondary | ICD-10-CM | POA: Diagnosis not present

## 2017-05-14 DIAGNOSIS — M150 Primary generalized (osteo)arthritis: Secondary | ICD-10-CM

## 2017-05-14 DIAGNOSIS — Z9989 Dependence on other enabling machines and devices: Secondary | ICD-10-CM

## 2017-05-14 DIAGNOSIS — E034 Atrophy of thyroid (acquired): Secondary | ICD-10-CM

## 2017-05-14 DIAGNOSIS — G8929 Other chronic pain: Secondary | ICD-10-CM | POA: Insufficient documentation

## 2017-05-14 DIAGNOSIS — E1122 Type 2 diabetes mellitus with diabetic chronic kidney disease: Secondary | ICD-10-CM | POA: Diagnosis not present

## 2017-05-14 DIAGNOSIS — Z9114 Patient's other noncompliance with medication regimen: Secondary | ICD-10-CM

## 2017-05-14 DIAGNOSIS — Z7901 Long term (current) use of anticoagulants: Secondary | ICD-10-CM | POA: Diagnosis not present

## 2017-05-14 DIAGNOSIS — M159 Polyosteoarthritis, unspecified: Secondary | ICD-10-CM | POA: Insufficient documentation

## 2017-05-14 DIAGNOSIS — E1142 Type 2 diabetes mellitus with diabetic polyneuropathy: Secondary | ICD-10-CM

## 2017-05-14 DIAGNOSIS — I480 Paroxysmal atrial fibrillation: Secondary | ICD-10-CM

## 2017-05-14 DIAGNOSIS — G4733 Obstructive sleep apnea (adult) (pediatric): Secondary | ICD-10-CM | POA: Diagnosis not present

## 2017-05-14 DIAGNOSIS — Z0001 Encounter for general adult medical examination with abnormal findings: Secondary | ICD-10-CM

## 2017-05-14 DIAGNOSIS — M8949 Other hypertrophic osteoarthropathy, multiple sites: Secondary | ICD-10-CM

## 2017-05-14 DIAGNOSIS — J449 Chronic obstructive pulmonary disease, unspecified: Secondary | ICD-10-CM

## 2017-05-14 DIAGNOSIS — Z0000 Encounter for general adult medical examination without abnormal findings: Secondary | ICD-10-CM

## 2017-05-14 NOTE — Progress Notes (Signed)
Subjective:    Patient ID: Philip [name]., male    DOB: 1946/04/16, 72 y.o.   MRN: [id number]  IKECHUKWU CERNY Sr. is a 72 y.o. male presenting on 05/14/2017 for Annual Exam   HPI   Here for Annual Physical and Lab Review  CHRONIC DM, Type 2: Last visit 02/28/17, see prior note for background information. Interval history did not change Lantus dose much, and did not check insurance/cost coverage of GLP1 meds, limited changes. Today he attributes elevated sugar to limited diet improvement, has not resumed DM diet again, but tried to limit portion sizes CBGs:Avg 150-200, High 375 to 400. No hypoglycemia - checks CBG 2-3x daily Meds: Metformin 1035m twice daily (5022mtabs), Januvia 10036maily, Lantus 14 units nightly (frequently misses Lantus dosing due to forgets and falls asleep) - Still taking Prednisone daily from Pulmonology, now as of 1 week ago, he is supposed to taper down to alternating day dosing 10 then 5mg20me has not regularly implemented this change yet Reports good compliance other than insulin. Tolerating well w/o side-effects Currently on ARB, on Statin Lifestyle: - Diet: admits limited, now trying to improve - Exercise: Walking at work, no other changes - Complicated by peripheral neuropathy and CKD-III - Due for DM Eye Exam - [facility] will schedule Denies hypoglycemia  History of OA/DJD, multiple joints / Knee Pain (R>L) - Prior history of R knee surgery in 2003. 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 Prior PSG completed in past. - Today reports that sleep apnea is well controlled. He uses the CPAP machine every night. 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 - Reports doing well, without anginal symptoms  Health Maintenance: UTD - Flu 02/28/17, UTD PNA vaccines, UTD Hep C screening, UTD Colonoscopy  Depression screen PHQ [facility] 09/19/2016 09/19/2016  Decreased Interest 0 0 0  Down, Depressed, Hopeless 0 0 0  PHQ - 2 Score 0 0 0  Altered sleeping - 0 -  Tired, decreased energy - 1 -  Change in appetite - 0 -  Feeling bad or failure about yourself  - 0 -  Trouble concentrating - 0 -  Moving slowly or fidgety/restless - 0 -  Suicidal thoughts - 0 -  PHQ-9 Score - 1 -    Past Medical History:  Diagnosis Date  . Arthritis    a. right knee  . Asthma   . Atrial arrhythmia    salvos of nonsustained atach  . 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 etiology.  . COPMarland Kitchen (chronic obstructive pulmonary disease) (HCC) [place] Coronary artery disease    a. 05/2012 Cath: severe  3VD-->CABG by Dr. Servando Snare in 05/2012 with LIMA to LAD, SVG to LCX & SVG to RPDA.  [name] History of bronchitis   . Hypercholesteremia   . Hypertension   . Hypothyroidism   . Shingles   . Thyroid disease   . Type II diabetes mellitus ([place])    Past Surgical History:  Procedure Laterality Date  . ANTERIOR CERVICAL DECOMP/DISCECTOMY FUSION  ~ 2009  . CARDIAC CATHETERIZATION  05/06/2012   Significant three-vessel coronary artery disease with normal ejection fraction  . CORONARY ARTERY BYPASS GRAFT  05/07/2012   Procedure: CORONARY ARTERY BYPASS GRAFTING  (CABG);  Surgeon: [name], MD;  Location: [facility];  Service: Open Heart Surgery;  Laterality: N/A;  times three  . INTRAOPERATIVE TRANSESOPHAGEAL ECHOCARDIOGRAM  05/07/2012   Procedure: INTRAOPERATIVE TRANSESOPHAGEAL ECHOCARDIOGRAM;  Surgeon: [name], MD;  Location: [facility];  Service: Open Heart Surgery;  Laterality: N/A;  . KNEE ARTHROSCOPY  ~ 2000   "right" (05/06/2012)  . KNEE SURGERY  2003   right  . NECK SURGERY  2008   Plate in neck   Social History   Socioeconomic History  . Marital status: Married    Spouse name: Not on file  . Number of children: Not on file  . Years of education: Not on file  . Highest education level: Not on file  Social Needs  . Financial resource strain: Not on file  . Food insecurity - worry: Not on file  . Food insecurity - inability: Not on file  . Transportation needs - medical: Not on file  . Transportation needs - non-medical: Not on file  Occupational History  . Not on file  Tobacco Use  . Smoking status: Former Smoker    Packs/day: 1.00    Years: 8.00    Pack years: 8.00    Types: Cigarettes    Last attempt to quit: 12/31/1967    Years since quitting: 49.4  . Smokeless tobacco: Never Used  Substance and Sexual Activity  . Alcohol use: No    Alcohol/week: 0.0 oz    Comment: 05/06/2012 "last alcohol several years ago; never had problem wit"  . Drug use: No  . Sexual activity: Not Currently  Other Topics Concern  . Not on file  Social History Narrative   ** Merged History Encounter **       Family History  Problem Relation Age of Onset  . Heart disease Mother   . Clotting disorder Mother   . Hypertension Sister    Current Outpatient Medications on File Prior to Visit  Medication Sig  . albuterol (PROVENTIL) (2.5 MG/3ML) 0.083% nebulizer solution Inhale 3 mLs into the lungs every 6 (six) hours as needed.  . Albuterol Sulfate 108 (90 Base) MCG/ACT AEPB Inhale 2 puffs into the lungs every 6 (six) hours as needed.  [name]  atorvastatin (LIPITOR) 20 MG tablet Take 1 tablet (20 mg total) by mouth at bedtime.  . cetirizine (ZYRTEC) 10 MG tablet Take 10 mg by mouth as needed for allergies.  [name] ELIQUIS 5 MG TABS tablet TAKE ONE TABLET BY MOUTH TWICE DAILY  . fluticasone (FLONASE) 50 MCG/ACT nasal spray Place 2 sprays at bedtime into both nostrils.   . furosemide (LASIX) 40 MG tablet TAKE ONE TABLET BY MOUTH ONCE DAILY  . gabapentin (NEURONTIN) 100 MG capsule Take 100 mg 3 (three) times daily by mouth.  [name] glucose blood (ONE TOUCH ULTRA TEST) test strip 1 each by Other route 2 (two) times daily. Use  as instructed to check bloods sugar twice a day.  [name] glucose monitoring kit (FREESTYLE) monitoring kit 1 each by Does not apply route as needed for other.  . Insulin Glargine (LANTUS) 100 UNIT/ML Solostar Pen Inject 10 Units into the skin daily at 10 pm.  . LANCETS ULTRA THIN MISC 1 Device by Does not apply route 4 (four) times daily -  before meals and at bedtime.  [name] levothyroxine (SYNTHROID, LEVOTHROID) 88 MCG tablet Take 1 tablet (88 mcg total) by mouth daily.  [name] losartan (COZAAR) 100 MG tablet Take 1 tablet (100 mg total) by mouth daily.  . metFORMIN (GLUCOPHAGE) 500 MG tablet TAKE TWO TABLETS BY MOUTH TWICE DAILY WITH FOOD  . montelukast (SINGULAIR) 10 MG tablet Take 10 mg by mouth at bedtime.   . predniSONE (DELTASONE) 10 MG tablet Take 10 mg by mouth daily.  . sitaGLIPtin (JANUVIA) 100 MG tablet Take 1 tablet (100 mg total) by mouth daily.  . SYMBICORT 160-4.5 MCG/ACT inhaler Inhale 2 puffs 2 (two) times daily into the lungs.    No current facility-administered medications on file prior to visit.     Review of Systems  Constitutional: Negative for activity change, appetite change, chills, diaphoresis, fatigue and fever.  HENT: Negative for congestion and hearing loss.   Eyes: Negative for visual disturbance.  Respiratory: Negative for apnea, cough, chest tightness, shortness of breath and wheezing.   Cardiovascular:  Negative for chest pain, palpitations and leg swelling.  Gastrointestinal: Negative for abdominal pain, anal bleeding, blood in stool, constipation, diarrhea, nausea and vomiting.  Endocrine: Negative for cold intolerance.  Genitourinary: Negative for decreased urine volume, dysuria, frequency, hematuria, testicular pain and urgency.  Musculoskeletal: Positive for arthralgias. Negative for neck pain.  Skin: Negative for rash.  Allergic/Immunologic: Negative for environmental allergies.  Neurological: Negative for dizziness, weakness, light-headedness, numbness and headaches.  Hematological: Negative for adenopathy.  Psychiatric/Behavioral: Negative for behavioral problems, dysphoric mood and sleep disturbance. The patient is not nervous/anxious.    Per HPI unless specifically indicated above     Objective:    BP (!) 118/57   Pulse 61   Temp 98.4 F (36.9 C) (Oral)   Resp 16   Ht _0  (1.803 m)   Wt 229 lb (103.9 kg)   BMI 31.94 kg/m   Wt Readings from Last 3 Encounters:  05/14/17 229 lb (103.9 kg)  03/12/17 226 lb 6.4 oz (102.7 kg)  02/28/17 237 lb (107.5 kg)    Physical Exam  Constitutional: He is oriented to person, place, and time. He appears well-developed and well-nourished. No distress.  Well-appearing, comfortable, cooperative  HENT:  Head: Normocephalic and atraumatic.  Mouth/Throat: Oropharynx is clear and moist.  Eyes: Conjunctivae and EOM are normal. Pupils are equal, round, and reactive to light. Right eye exhibits no discharge. Left eye exhibits no discharge.  Neck: Normal range of motion. Neck supple. No thyromegaly present.  Cardiovascular: Normal rate, regular rhythm, normal heart sounds and intact distal pulses.  No murmur heard. Pulmonary/Chest: Effort normal and breath sounds normal. No respiratory distress. He has no wheezes. He has no rales.  Abdominal: Soft. Bowel sounds are normal. He exhibits no distension and no mass. There is no tenderness.    Musculoskeletal: He exhibits no edema or tenderness.  Upper / Lower Extremities: - Normal muscle tone, strength bilateral upper extremities 5/5, lower extremities 5/5  Right Knee Inspection: Slightly bulky appearance but seems symmetrical. No ecchymosis or effusion. Palpation: Non-tender jointline with significant crepitus on ROM  ROM: Slightly reduced full flexion but otherwise active ROM bilaterally Strength: 5/5 intact knee flex/ext, ankle dorsi/plantarflex Neurovascular: distally intact sensation light touch and pulses  Lymphadenopathy:    He has no cervical adenopathy.  Neurological: He is alert and oriented to person, place, and time.  Distal sensation intact to light touch all extremities  Skin: Skin is warm and dry. No rash noted. He is not diaphoretic. No erythema.  Psychiatric: He has a normal mood and affect. His behavior is normal.  Well groomed, good eye contact, normal speech and thoughts  Nursing note and vitals reviewed.  Results for orders placed or performed in visit on 05/07/17  T4, free  Result Value Ref Range   Free T4 1.2 0.8 - 1.8 ng/dL  TSH  Result Value Ref Range   TSH 4.69 (H) 0.40 - 4.50 mIU/L  PSA, Total with Reflex to PSA, Free  Result Value Ref Range   PSA, Total 1.5 < OR = 4.0 ng/mL  Lipid panel  Result Value Ref Range   Cholesterol 158 <200 mg/dL   HDL 67 >40 mg/dL   Triglycerides 64 <150 mg/dL   LDL Cholesterol (Calc) 77 mg/dL (calc)   Total CHOL/HDL Ratio 2.4 <5.0 (calc)   Non-HDL Cholesterol (Calc) 91 <130 mg/dL (calc)  CBC with Differential/Platelet  Result Value Ref Range   WBC 8.1 3.8 - 10.8 Thousand/uL   RBC 4.70 4.20 - 5.80 Million/uL   Hemoglobin 14.2 13.2 - 17.1 g/dL   HCT 41.8 38.5 - 50.0 %   MCV 88.9 80.0 - 100.0 fL   MCH 30.2 27.0 - 33.0 pg   MCHC 34.0 32.0 - 36.0 g/dL   RDW 12.8 11.0 - 15.0 %   Platelets 184 140 - 400 Thousand/uL   MPV 11.4 7.5 - 12.5 fL   Neutro Abs 6,229 1,500 - 7,800 cells/uL   Lymphs Abs 948 850 -  3,900 cells/uL   WBC mixed population 761 200 - 950 cells/uL   Eosinophils Absolute 113 15 - 500 cells/uL   Basophils Absolute 49 0 - 200 cells/uL   Neutrophils Relative % 76.9 %   Total Lymphocyte 11.7 %   Monocytes Relative 9.4 %   Eosinophils Relative 1.4 %   Basophils Relative 0.6 %  Hemoglobin A1c  Result Value Ref Range   Hgb A1c MFr Bld 8.7 (H) <5.7 % of total Hgb   Mean Plasma Glucose 203 (calc)   eAG (mmol/L) 11.2 (calc)  COMPLETE METABOLIC PANEL WITH GFR  Result Value Ref Range   Glucose, Bld 186 (H) 65 - 99 mg/dL   BUN 24 7 - 25 mg/dL   Creat 1.15 0.70 - 1.18 mg/dL   GFR, Est Non African American 64 > OR = 60 mL/min/1.43m   GFR, Est African American 74 > OR = 60 mL/min/1.73m  BUN/Creatinine Ratio NOT APPLICABLE 6 - 22 (calc)   Sodium 141 135 - 146 mmol/L   Potassium 4.2 3.5 - 5.3 mmol/L   Chloride 103 98 - 110 mmol/L   CO2 31 20 - 32 mmol/L   Calcium 9.1 8.6 - 10.3 mg/dL   Total Protein 5.9 (L) 6.1 - 8.1 g/dL   Albumin 4.1 3.6 - 5.1 g/dL   Globulin 1.8 (L) 1.9 - 3.7 g/dL (calc)   AG Ratio 2.3 1.0 - 2.5 (calc)   Total Bilirubin 0.4 0.2 - 1.2 mg/dL   Alkaline phosphatase (APISO) 84 40 - 115 U/L   AST 11 10 - 35 U/L   ALT 15  9 - 46 U/L      Assessment & Plan:   Problem List Items Addressed This Visit    Atrial fibrillation ([place])    Stable, without worsening or complication. Currently sinus without AFib, occasional PAC On chronic anticoagulation Eliquis Followed by [facility], also Dr Rayann Heman EP - last seen 03/2017, taken off Amio, not indicated for ablation at this time      Relevant Medications   atorvastatin (LIPITOR) 20 MG tablet   Chronic anticoagulation    Stable on Eliquis Without bleeding Indicated for PAF      Chronic pain of left knee    See A&P OA/DJD      CKD (chronic kidney disease), stage II    Improved CKD from Stage 3 to 2, based on GFR with improved Cr Secondary to HTN, DM2, Age possibly NSAID Continue ARB Reassurance  today Follow-up Cr monitor      COPD with asthma ([place])    Stable COPD without exac Continue maintenance Spiriva Followed by [place] - now tapering down chronic Prednisone from 57m daily to 13m/ 23m12mod alternating dose, hopeful to taper off      Essential (primary) hypertension (Chronic)    Well-controlled HTN Complication with CKD-III   Plan:  1. Continue current BP regimen - Losartan 100m38mily 2. Encourage improved lifestyle - low sodium diet, regular exercise 3. Continue monitor BP outside office, bring readings to next visit, if persistently >140/90 or new symptoms notify office sooner      Relevant Medications   atorvastatin (LIPITOR) 20 MG tablet   Hypothyroid    Stable, controlled on current dose Levothyroxine 88mc23mH minimally elevated, improved from prior. Normal Free T4 Refilled      OSA on CPAP    Well controlled, chronic OSA on CPAP - Good adherence to CPAP nightly - Continue current CPAP therapy, patient seems to be benefiting from therapy      Osteoarthritis of multiple joints    Gradual worsening R knee pain and limitations, with known multiple joint OA/DJD, mostly knees [facility] Reviewed conservative therapy again Consider repeat X-ray but deferred since may refer to Ortho soon at request, advised return to Dr HooteMarry Guanre-eval. Consider steroid or other injections      Type 2 diabetes mellitus with peripheral neuropathy (HCC) [place] with A1c 8.7, up from 7.6, had been similar 8.8 in past, primarily attributed to poor lifestyle limited diet/exercise, forgetting / non adherence Lantus dosing PM Complications - CKD-III, peripheral neuropathy  Plan:  1. Again discussed new med GLP1 - now emphasis on needs to change. He will contact ins - stay tuned, will send rx of requested GLP, see last note for review of options / and per AVS - When start GLP1, will discontinue DPP4 Januvia (finish 1-2 weeks of it if  possible), counseling on potential side effects - Continue Metformin 1000mg 40m- Continue Lantus 14u nightly initially, then after 1-2 weeks, start titrate down gradually if fasting AM CBG < 150 consistently decrease by 1 unit q 3-5 days 2. Encourage improved lifestyle - low carb, low sugar diet, reduce portion size, continue improving regular exercise 3. Check CBG, bring log to next visit for review 4. Continue ARB, Statin 5. Advised to schedule DM ophtho exam, send record - [place] [facility] 6. Follow-up 3 months A1c      Relevant Medications   atorvastatin (LIPITOR) 20 MG tablet  Other Visit Diagnoses    Annual physical exam    -  Primary      No orders of the defined types were placed in this encounter.   Follow up plan: Return in about 3 months (around 08/12/2017) for Diabetes A1c (med adjust).  [name], [facility] 05/15/2017, 2:01 AM

## 2017-05-14 NOTE — Patient Instructions (Addendum)
Thank you for coming to the office today.  1.  Try to keep working on improving lifestyle and diet as we discussed  Try to keep up with Lantus insulin for now try to remember dosing at night to avoid forgetting  NEXT Call insurance find cost and coverage of the following - If you get one of these medicines, and start using it, then I would recommend STOPPING Januvia, so I would advise that you stop this within 1-2 weeks of taking the new medicine, try not to refill Januvia again to save cost as long as we get new medicine  Continue Metformin  For Lantus 14 units nightly - check morning fasting sugar, if < 150, consistently for 3 to 5 days, then REDUCE dose of next Lantus by 1 unit  1. Ozempic (Semaglutide injection) - start 0.25mg  weekly for 4 weeks then increase to 0.5mg  weekly - This one has best benefit of weight loss and reducing Cardiovascular events  2. Bydureon BCise (Exenatide ER) - once weekly - this is my preference, very good medicine well tolerated, less side effects of nausea, upset stomach. No dose changes. Cost and coverage is the problem, but we may be able to get it with the coupon card  3. Trulicity (Dulaglutide) - once weekly - this is very good one, usually one of my top choices as well, two doses, 0.75 (likely we would start) and 1.5 max dose. We can use coupon card here too  4. Victoza (Liraglutide) - once DAILY - 3 dose changes 0.6, 1.2 and 1.8, side effects nausea, upset stomach higher on this one but it is still very effective medicine  - For Prednisone and breathing - remember as Dr Raul Del mentioned - try to REDUCE DOSE - take 1 whole tab 10mg  on one day then next day HALF tab 5mg , ALTERNATING DAYS, follow-up with him in 6 months  Please call and schedule DM [place]  Please schedule a Follow-up Appointment to: Return in about 3 months (around 08/12/2017) for Diabetes A1c (med adjust).    If you have any other questions or concerns, please  feel free to call the office or send a message through [place]. You may also schedule an earlier appointment if necessary.  Additionally, you may be receiving a survey about your experience at our office within a few days to 1 week by e-mail or mail. We value your feedback.  [name], DO [place]

## 2017-05-15 NOTE — Assessment & Plan Note (Signed)
Uncontrolled DM with A1c 8.7, up from 7.6, had been similar 8.8 in past, primarily attributed to poor lifestyle limited diet/exercise, forgetting / non adherence Lantus dosing PM Complications - CKD-III, peripheral neuropathy  Plan:  1. Again discussed new med GLP1 - now emphasis on needs to change. He will contact ins - stay tuned, will send rx of requested GLP, see last note for review of options / and per AVS - When start GLP1, will discontinue DPP4 Januvia (finish 1-2 weeks of it if possible), counseling on potential side effects - Continue Metformin 1000mg  BID - Continue Lantus 14u nightly initially, then after 1-2 weeks, start titrate down gradually if fasting AM CBG < 150 consistently decrease by 1 unit q 3-5 days 2. Encourage improved lifestyle - low carb, low sugar diet, reduce portion size, continue improving regular exercise 3. Check CBG, bring log to next visit for review 4. Continue ARB, Statin 5. Advised to schedule DM ophtho exam, send record - [place] [facility] 6. Follow-up 3 months A1c

## 2017-05-15 NOTE — Assessment & Plan Note (Signed)
Stable, controlled on current dose Levothyroxine 32mcg TSH minimally elevated, improved from prior. Normal Free T4 Refilled

## 2017-05-15 NOTE — Assessment & Plan Note (Signed)
Stable COPD without exac Continue maintenance Spiriva Followed by [place] - now tapering down chronic Prednisone from 10mg  daily to 10mg  / 5mg  qod alternating dose, hopeful to taper off

## 2017-05-15 NOTE — Assessment & Plan Note (Signed)
Stable, without worsening or complication. Currently sinus without AFib, occasional PAC On chronic anticoagulation Eliquis Followed by [facility], also Dr Rayann Heman EP - last seen 03/2017, taken off Amio, not indicated for ablation at this time

## 2017-05-15 NOTE — Assessment & Plan Note (Signed)
See A&P OA/DJD

## 2017-05-15 NOTE — Assessment & Plan Note (Signed)
Well controlled, chronic OSA on CPAP - Good adherence to CPAP nightly - Continue current CPAP therapy, patient seems to be benefiting from therapy  

## 2017-05-15 NOTE — Assessment & Plan Note (Signed)
Gradual worsening R knee pain and limitations, with known multiple joint OA/DJD, mostly knees S/p R knee surgery Dr Marry Guan [facility] 2003 Reviewed conservative therapy again Consider repeat X-ray but deferred since may refer to Ortho soon at request, advised return to Dr Marry Guan for re-eval. Consider steroid or other injections

## 2017-05-15 NOTE — Assessment & Plan Note (Signed)
Stable on Eliquis Without bleeding Indicated for PAF

## 2017-05-15 NOTE — Assessment & Plan Note (Signed)
Improved CKD from Stage 3 to 2, based on GFR with improved Cr Secondary to HTN, DM2, Age possibly NSAID Continue ARB Reassurance today Follow-up Cr monitor 

## 2017-05-15 NOTE — Assessment & Plan Note (Signed)
Well-controlled HTN Complication with CKD-III   Plan:  1. Continue current BP regimen - Losartan 100mg  daily 2. Encourage improved lifestyle - low sodium diet, regular exercise 3. Continue monitor BP outside office, bring readings to next visit, if persistently >140/90 or new symptoms notify office sooner

## 2017-05-16 ENCOUNTER — Telehealth: Payer: Self-pay | Admitting: Family Medicine

## 2017-05-16 DIAGNOSIS — E1142 Type 2 diabetes mellitus with diabetic polyneuropathy: Secondary | ICD-10-CM

## 2017-05-16 MED ORDER — BYDUREON BCISE 2 MG/0.85ML ~~LOC~~ AUIJ: 2.0000 mg | AUTO-INJECTOR | SUBCUTANEOUS | 3 refills | Status: DC

## 2017-05-16 NOTE — Telephone Encounter (Signed)
Pt advised will pick up discount card this afternoon.

## 2017-05-16 NOTE — Telephone Encounter (Signed)
Pt. Called back states that insurance will pay for Bydureon

## 2017-05-16 NOTE — Telephone Encounter (Signed)
Rx sent to [place], Bydureon BCise 2mg  weekly injection, #4 pens sent for 1 month supply and refills given.  Please notify patient that rx was sent, and that he should come by our office to pick up a 1 month Free Trial Offer from the company.  Philip [name], [facility] 05/16/2017, 11:56 AM

## 2017-06-11 ENCOUNTER — Other Ambulatory Visit: Payer: Self-pay | Admitting: Cardiovascular Disease

## 2017-06-21 DIAGNOSIS — H2511 Age-related nuclear cataract, right eye: Secondary | ICD-10-CM | POA: Diagnosis not present

## 2017-06-21 LAB — HM DIABETES EYE EXAM

## 2017-07-02 ENCOUNTER — Other Ambulatory Visit: Payer: Self-pay | Admitting: Cardiovascular Disease

## 2017-07-02 NOTE — Telephone Encounter (Signed)
Refill Request.  

## 2017-07-04 ENCOUNTER — Encounter: Payer: Self-pay | Admitting: Family Medicine

## 2017-07-04 ENCOUNTER — Ambulatory Visit: Payer: Medicare Other | Admitting: Family Medicine

## 2017-07-04 VITALS — BP 132/75 | HR 111 | Temp 98.4°F | Resp 16 | Ht 71.0 in | Wt 227.0 lb

## 2017-07-04 DIAGNOSIS — M7022 Olecranon bursitis, left elbow: Secondary | ICD-10-CM

## 2017-07-04 MED ORDER — DOXYCYCLINE HYCLATE 100 MG PO TABS: 100.0000 mg | ORAL_TABLET | Freq: Two times a day (BID) | ORAL | 0 refills | Status: DC

## 2017-07-04 MED ORDER — DICLOFENAC SODIUM 1 % TD GEL: 2.0000 g | Freq: Three times a day (TID) | TRANSDERMAL | 2 refills | Status: DC | PRN

## 2017-07-04 NOTE — Progress Notes (Signed)
Subjective:    Patient ID: Philip Husbands., male    DOB: 1945/08/08, 72 y.o.   MRN: [id number]  Philip FLEMISTER Sr. is a 72 y.o. male presenting on 07/04/2017 for Joint Swelling (elbow--tender but not achy, red and swollen a little onset months but progressively getting worst now)  Patient presents for a same day appointment.  HPI   OLECRANON BURSITIS, Left Reports new complaint today of Left elbow swelling, onset has been persistent for few months now, but he was urged to come to doctor by family, he actually had some mild improvement with reduced swelling recently. No known injury or trauma, he does admit to leaning on L elbow when driving. 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 Admits it was mildly tender initially and at times, but not actively painful, some mild redness, and was told by family they "felt heat in it" and asked him to come in - Taking Advil in past, he was swapped to Tylenol - due to interaction with Eliquis stopped NSAID - Not tried any topicals or wrap or ice packs - Denies any fevers, chills, spreading redness, drainage of pus or bleeding, limited range of elbow and arm   Depression screen [facility] 2/9 05/14/2017 09/19/2016 09/19/2016  Decreased Interest 0 0 0  Down, Depressed, Hopeless 0 0 0  PHQ - 2 Score 0 0 0  Altered sleeping - 0 -  Tired, decreased energy - 1 -  Change in appetite - 0 -  Feeling bad or failure about yourself  - 0 -  Trouble concentrating - 0 -  Moving slowly or fidgety/restless - 0 -  Suicidal thoughts - 0 -  PHQ-9 Score - 1 -    Social History   Tobacco Use  . Smoking status: Former Smoker    Packs/day: 1.00    Years: 8.00    Pack years: 8.00    Types: Cigarettes    Last attempt to quit: 12/31/1967    Years since quitting: 49.5  . Smokeless tobacco: Never Used  Substance Use Topics  . Alcohol use: No    Alcohol/week: 0.0 oz    Comment: 05/06/2012 "last alcohol several  years ago; never had problem wit"  . Drug use: No    Review of Systems Per HPI unless specifically indicated above     Objective:    BP 132/75   Pulse (!) 111   Temp 98.4 F (36.9 C) (Oral)   Resp 16   Ht 5\' 11"  (1.803 m)   Wt 227 lb (103 kg)   BMI 31.66 kg/m   Wt Readings from Last 3 Encounters:  07/04/17 227 lb (103 kg)  05/14/17 229 lb (103.9 kg)  03/12/17 226 lb 6.4 oz (102.7 kg)    Physical Exam  Constitutional: He is oriented to person, place, and time. He appears well-developed and well-nourished. No distress.  Well-appearing, comfortable, cooperative  HENT:  Head: Normocephalic and atraumatic.  Mouth/Throat: Oropharynx is clear and moist.  Eyes: Conjunctivae are normal. Right eye exhibits no discharge. Left eye exhibits no discharge.  Cardiovascular:  Tachycardic  Pulmonary/Chest: Effort normal.  Musculoskeletal:  Left upper extremity elbow - soft palpable fluctuance of olecranon bursa with mild erythema localized, not extending, non tender, no ulceration or drainage or leakage, minimal warmth, full range of motion elbow joint flex/ext  Neurological: He is alert and oriented to person, place, and time.  Skin: Skin is warm and  dry. No rash noted. He is not diaphoretic. No erythema.  Psychiatric: He has a normal mood and affect. His behavior is normal.  Well groomed, good eye contact, normal speech and thoughts  Nursing note and vitals reviewed.  Left Elbow      Results for orders placed or performed in visit on 05/07/17  T4, free  Result Value Ref Range   Free T4 1.2 0.8 - 1.8 ng/dL  TSH  Result Value Ref Range   TSH 4.69 (H) 0.40 - 4.50 mIU/L  PSA, Total with Reflex to PSA, Free  Result Value Ref Range   PSA, Total 1.5 < OR = 4.0 ng/mL  Lipid panel  Result Value Ref Range   Cholesterol 158 <200 mg/dL   HDL 67 >40 mg/dL   Triglycerides 64 <150 mg/dL   LDL Cholesterol (Calc) 77 mg/dL (calc)   Total CHOL/HDL Ratio 2.4 <5.0 (calc)   Non-HDL  Cholesterol (Calc) 91 <130 mg/dL (calc)  CBC with Differential/Platelet  Result Value Ref Range   WBC 8.1 3.8 - 10.8 Thousand/uL   RBC 4.70 4.20 - 5.80 Million/uL   Hemoglobin 14.2 13.2 - 17.1 g/dL   HCT 41.8 38.5 - 50.0 %   MCV 88.9 80.0 - 100.0 fL   MCH 30.2 27.0 - 33.0 pg   MCHC 34.0 32.0 - 36.0 g/dL   RDW 12.8 11.0 - 15.0 %   Platelets 184 140 - 400 Thousand/uL   MPV 11.4 7.5 - 12.5 fL   Neutro Abs 6,229 1,500 - 7,800 cells/uL   Lymphs Abs 948 850 - 3,900 cells/uL   WBC mixed population 761 200 - 950 cells/uL   Eosinophils Absolute 113 15 - 500 cells/uL   Basophils Absolute 49 0 - 200 cells/uL   Neutrophils Relative % 76.9 %   Total Lymphocyte 11.7 %   Monocytes Relative 9.4 %   Eosinophils Relative 1.4 %   Basophils Relative 0.6 %  Hemoglobin A1c  Result Value Ref Range   Hgb A1c MFr Bld 8.7 (H) <5.7 % of total Hgb   Mean Plasma Glucose 203 (calc)   eAG (mmol/L) 11.2 (calc)  COMPLETE METABOLIC PANEL WITH GFR  Result Value Ref Range   Glucose, Bld 186 (H) 65 - 99 mg/dL   BUN 24 7 - 25 mg/dL   Creat 1.15 0.70 - 1.18 mg/dL   GFR, Est Non African American 64 > OR = 60 mL/min/1.66m2   GFR, Est African American 74 > OR = 60 mL/min/1.72m2   BUN/Creatinine Ratio NOT APPLICABLE 6 - 22 (calc)   Sodium 141 135 - 146 mmol/L   Potassium 4.2 3.5 - 5.3 mmol/L   Chloride 103 98 - 110 mmol/L   CO2 31 20 - 32 mmol/L   Calcium 9.1 8.6 - 10.3 mg/dL   Total Protein 5.9 (L) 6.1 - 8.1 g/dL   Albumin 4.1 3.6 - 5.1 g/dL   Globulin 1.8 (L) 1.9 - 3.7 g/dL (calc)   AG Ratio 2.3 1.0 - 2.5 (calc)   Total Bilirubin 0.4 0.2 - 1.2 mg/dL   Alkaline phosphatase (APISO) 84 40 - 115 U/L   AST 11 10 - 35 U/L   ALT 15 9 - 46 U/L      Assessment & Plan:   Problem List Items Addressed This Visit    None    Visit Diagnoses    Olecranon bursitis of left elbow    -  Primary Consistent with chronic >2-3 month Left olecranon bursitis, without evidence of secondary  infection or cellulitis, but has  some mild erythema localized - Suspected from prolonged leaning and pressure on L elbow while driving, history of likely underlying OA/DJD, also with some thin skin and may led to swelling. - Range of motion of elbow is normal, and not restricted - No injury or trauma - Ddx: consider possible gout  Plan 1. Reassurance, likely benign and will improve, however now >3 months may be more chronic and possibly require drainage in future 2. Defer aspiration today due to mild erythema and concerns for providing antibiotics to cover for infection, and reviewed risk of recurrence of swelling 3. Start empiric antibiotic - Doxycycline 100mg  BID x 10 days - has many other allergies including PCN 4. Start topical Diclofenac gel TID PRN for reducing inflammation topically, cannot tolerate NSAIDs oral, also on anticoagulation eliquis, caution w/ interaction 5. RICE therapy 6. Avoid prolong leaning pressure on elbow 7. Follow-up within few weeks if not improved, re-consider aspiration if persistent, may send fluid for infection vs crystals, also may refer to Ortho if needed - Return criteria given if worsening cellulitis or joint infection, when to go to hospital sooner    Relevant Medications   doxycycline (VIBRA-TABS) 100 MG tablet   diclofenac sodium (VOLTAREN) 1 % GEL      Meds ordered this encounter  Medications  . doxycycline (VIBRA-TABS) 100 MG tablet    Sig: Take 1 tablet (100 mg total) by mouth 2 (two) times daily. For 10 days. Take with full glass of water, stay upright 30 min after taking.    Dispense:  20 tablet    Refill:  0  . diclofenac sodium (VOLTAREN) 1 % GEL    Sig: Apply 2 g topically 3 (three) times daily as needed (Use on elbow bursitis and knee).    Dispense:  100 g    Refill:  2    Follow up plan: Return if symptoms worsen or fail to improve, for elbow.  [name], [facility] 07/05/2017, 12:31 AM

## 2017-07-04 NOTE — Patient Instructions (Addendum)
Thank you for coming to the office today.  1.  You have an Olecranon Bursitis (elbow swelling) - this is due to inflammation within the joint, it is a problem we commonly see with history of arthritis or other irritation to the joint. - Usually this will resolve with time, body will re-absorb the fluid and it will go down - If continued pressure and leaning on elbow it may last longer or may not go away - Rarely can it be infected, we will cover with antibiotics today to make sure there is no infection - Doxycycline twice daily for 10 days, as prescribed - Also given rx topical Diclofenac gel to reduce inflammation and swelling in joint, this is arthritis rx cream can use on knees as well - if cannot get covered, sometimes limited by insurance on this one, let me know  If worsening fever, redness, chills, pain or swelling then we may need to cover with alternative antibiotic or drain the elbow, please notify office and if worsening symptoms then we may have you go to hospital for evaluation.   Please schedule a Follow-up Appointment to: Return if symptoms worsen or fail to improve, for elbow.    If you have any other questions or concerns, please feel free to call the office or send a message through [place]. You may also schedule an earlier appointment if necessary.  Additionally, you may be receiving a survey about your experience at our office within a few days to 1 week by e-mail or mail. We value your feedback.  [name], DO [facility], [facility]    Elbow Bursitis Elbow bursitis is inflammation of the fluid-filled sac (bursa) between the tip of your elbow bone (olecranon) and your skin. Elbow bursitis may also be called olecranon bursitis. Normally, the olecranon bursa has only a small amount of fluid in it to cushion and protect your elbow bone. Elbow bursitis causes fluid to build up inside the bursa. Over time, this swelling and inflammation can cause pain  when you bend or lean on your elbow. What are the causes? Elbow bursitis may be caused by:  Elbow injury (acute trauma).  Leaning on hard surfaces for long periods of time.  Infection from an injury that breaks the skin near your elbow.  A bone growth (spur) that forms at the tip of your elbow.  A medical condition that causes inflammation in your body, such as gout or rheumatoid arthritis.  The cause may also be unknown. What are the signs or symptoms? The first sign of elbow bursitis is usually swelling over the tip of your elbow. This can grow to be the size of a golf ball. This may start suddenly or develop gradually. You may also have:  Pain when bending or leaning on your elbow.  Restricted movement of your elbow.  If your bursitis is caused by an infection, symptoms may also include:  Redness, warmth, and tenderness of the elbow.  Drainage of pus from the swollen area over your elbow, if the skin breaks open.  How is this diagnosed? Your health care provider may be able to diagnose elbow bursitis based on your signs and symptoms, especially if you have recently been injured. Your health care provider will also do a physical exam. This may include:  X-rays to look for a bone spur or a bone fracture.  Draining fluid from the bursa to test it for infection.  Blood tests to rule out gout or rheumatoid arthritis.  How  is this treated? Treatment for elbow bursitis depends on the cause. Treatment may include:  Medicines. These may include: ? Over-the-counter medicines to relieve pain and inflammation. ? Antibiotic medicines to fight infection. ? Injections of anti-inflammatory medicines (steroids).  Wrapping your elbow with a bandage.  Draining fluid from the bursa.  Wearing elbow pads.  If your bursitis does not get better with treatment, surgery may be needed to remove the bursa. Follow these instructions at home:  Take medicines only as directed by your health  care provider.  If you were prescribed an antibiotic medicine, finish all of it even if you start to feel better.  If your bursitis is caused by an injury, rest your elbow and wear your bandage as directed by your health care provider. You may alsoapply ice to the injured area as directed by your health care provider: ? Put ice in a plastic bag. ? Place a towel between your skin and the bag. ? Leave the ice on for 20 minutes, 2-3 times per day.  Avoid any activities that cause elbow pain.  Use elbow pads or elbow wraps to cushion your elbow. Contact a health care provider if:  You have a fever.  Your symptoms do not get better with treatment.  Your pain or swelling gets worse.  Your elbow pain or swelling goes away and then returns.  You have drainage of pus from the swollen area over your elbow. This information is not intended to replace advice given to you by your health care provider. Make sure you discuss any questions you have with your health care provider. Document Released: 05/17/2006 Document Revised: 09/23/2015 Document Reviewed: 12/24/2013 Elsevier Interactive Patient Education  [employer].

## 2017-07-05 ENCOUNTER — Other Ambulatory Visit: Payer: Self-pay | Admitting: Family Medicine

## 2017-07-05 MED ORDER — ATORVASTATIN CALCIUM 20 MG PO TABS: 20.0000 mg | ORAL_TABLET | Freq: Every day | ORAL | 0 refills | Status: DC

## 2017-07-06 ENCOUNTER — Ambulatory Visit: Payer: Medicare Other | Admitting: Cardiovascular Disease

## 2017-07-06 ENCOUNTER — Encounter: Payer: Self-pay | Admitting: Cardiovascular Disease

## 2017-07-06 VITALS — BP 106/80 | HR 115 | Ht 71.0 in | Wt 227.0 lb

## 2017-07-06 DIAGNOSIS — I2510 Atherosclerotic heart disease of native coronary artery without angina pectoris: Secondary | ICD-10-CM | POA: Diagnosis not present

## 2017-07-06 DIAGNOSIS — E785 Hyperlipidemia, unspecified: Secondary | ICD-10-CM | POA: Diagnosis not present

## 2017-07-06 DIAGNOSIS — I480 Paroxysmal atrial fibrillation: Secondary | ICD-10-CM | POA: Diagnosis not present

## 2017-07-06 DIAGNOSIS — I10 Essential (primary) hypertension: Secondary | ICD-10-CM | POA: Diagnosis not present

## 2017-07-06 DIAGNOSIS — I471 Supraventricular tachycardia: Secondary | ICD-10-CM | POA: Diagnosis not present

## 2017-07-06 DIAGNOSIS — I4719 Other supraventricular tachycardia: Secondary | ICD-10-CM

## 2017-07-06 MED ORDER — METOPROLOL SUCCINATE ER 25 MG PO TB24: 25.0000 mg | ORAL_TABLET | Freq: Every day | ORAL | 3 refills | Status: DC

## 2017-07-06 MED ORDER — LOSARTAN POTASSIUM 50 MG PO TABS: 50.0000 mg | ORAL_TABLET | Freq: Every day | ORAL | 3 refills | Status: DC

## 2017-07-06 NOTE — Progress Notes (Signed)
Cardiology Office Note   Date:  07/06/2017   ID:  Philip Potters Sr., DOB 08/08/1945, MRN [id number]  PCP:  [name], DO  Cardiologist:   [name], MD   Chief Complaint  Patient presents with  . Other    6 month follow up. Patient c/o SOB and knee swelling. Meds reviewed verbally with patient.       History of Present Illness: Philip [name]. is a 72 y.o. male who presents for a follow-up visit regarding Coronary artery disease, paroxysmal atrial fibrillation and atrial tachycardia.   He is status post CABG in January of 2014.  He had postoperative atrial fibrillation which was treated with amiodarone.    He is known to have frequent PACs and atrial tachycardia with pauses.  In August 2016 he had suspected atrial fibrillation on his EKG and thus he was started on Eliquis. Most recent echocardiogram in March 2017 showed low normal LV systolic function with an EF of 50-55%, mildly dilated left atrium and no evidence of pulmonary hypertension. He also has COPD followed by Dr. Raul Del.  The patient is known to have paroxysmal atrial tachycardia and probably short runs of atrial fibrillation.  He was seen by Dr. Rayann Heman last year to consider ablation but given minimal symptoms, medical therapy was recommended.  Amiodarone was discontinued.  The patient used to be on metoprolol but not over the last 6 months. He reports stable dyspnea overall with no palpitations or shortness of breath.  He is noted to be tachycardic today with a heart rate of 115 bpm.  Past Medical History:  Diagnosis Date  . Arthritis    a. right knee  . Asthma   . Atrial arrhythmia    salvos of nonsustained atach  . 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 etiology.  [name] COPD (chronic obstructive pulmonary disease) ([place])   . Coronary artery disease    a. 05/2012 Cath: severe 3VD-->CABG by Dr. Servando Snare in 05/2012 with LIMA to LAD, SVG to LCX & SVG to RPDA.  [name] History of bronchitis   .  Hypercholesteremia   . Hypertension   . Hypothyroidism   . Shingles   . Thyroid disease   . Type II diabetes mellitus ([place])     Past Surgical History:  Procedure Laterality Date  . ANTERIOR CERVICAL DECOMP/DISCECTOMY FUSION  ~ 2009  . CARDIAC CATHETERIZATION  05/06/2012   Significant three-vessel coronary artery disease with normal ejection fraction  . CORONARY ARTERY BYPASS GRAFT  05/07/2012   Procedure: CORONARY ARTERY BYPASS GRAFTING (CABG);  Surgeon: [name], MD;  Location: [place];  Service: Open Heart Surgery;  Laterality: N/A;  times three  . INTRAOPERATIVE TRANSESOPHAGEAL ECHOCARDIOGRAM  05/07/2012   Procedure: INTRAOPERATIVE TRANSESOPHAGEAL ECHOCARDIOGRAM;  Surgeon: [name], MD;  Location: [place];  Service: Open Heart Surgery;  Laterality: N/A;  . KNEE ARTHROSCOPY  ~ 2000   "right" (05/06/2012)  . KNEE SURGERY  2003   right  . NECK SURGERY  2008   Plate in neck     Current Outpatient Medications  Medication Sig Dispense Refill  . albuterol (PROVENTIL) (2.5 MG/3ML) 0.083% nebulizer solution Inhale 3 mLs into the lungs every 6 (six) hours as needed.    . Albuterol Sulfate 108 (90 Base) MCG/ACT AEPB Inhale 2 puffs into the lungs every 6 (six) hours as needed.    [name] atorvastatin (LIPITOR) 20 MG tablet Take 1 tablet (20 mg total) by  mouth at bedtime. 90 tablet 0  . BYDUREON BCISE 2 MG/0.85ML AUIJ Inject 2 mg into the skin once a week. 4 pen 3  . cetirizine (ZYRTEC) 10 MG tablet Take 10 mg by mouth as needed for allergies.    [name] diclofenac sodium (VOLTAREN) 1 % GEL Apply 2 g topically 3 (three) times daily as needed (Use on elbow bursitis and knee). 100 g 2  . doxycycline (VIBRA-TABS) 100 MG tablet Take 1 tablet (100 mg total) by mouth 2 (two) times daily. For 10 days. Take with full glass of water, stay upright 30 min after taking. 20 tablet 0  . ELIQUIS 5 MG TABS tablet TAKE ONE TABLET BY MOUTH TWICE DAILY 60 tablet 6  . fluticasone (FLONASE) 50 MCG/ACT nasal spray  Place 2 sprays at bedtime into both nostrils.     . furosemide (LASIX) 40 MG tablet TAKE ONE TABLET BY MOUTH ONCE DAILY 90 tablet 2  . gabapentin (NEURONTIN) 100 MG capsule Take 100 mg 3 (three) times daily by mouth.    [name] glucose blood (ONE TOUCH ULTRA TEST) test strip 1 each by Other route 2 (two) times daily. Use as instructed to check bloods sugar twice a day. 100 each 12  . glucose monitoring kit (FREESTYLE) monitoring kit 1 each by Does not apply route as needed for other. 1 each 0  . Insulin Glargine (LANTUS) 100 UNIT/ML Solostar Pen Inject 10 Units into the skin daily at 10 pm. 5 pen 11  . LANCETS ULTRA THIN MISC 1 Device by Does not apply route 4 (four) times daily -  before meals and at bedtime. 200 each 5  . levothyroxine (SYNTHROID, LEVOTHROID) 88 MCG tablet Take 1 tablet (88 mcg total) by mouth daily. 30 tablet 6  . losartan (COZAAR) 100 MG tablet Take 1 tablet (100 mg total) by mouth daily. 30 tablet 6  . metFORMIN (GLUCOPHAGE) 500 MG tablet TAKE TWO TABLETS BY MOUTH TWICE DAILY WITH FOOD 360 tablet 3  . montelukast (SINGULAIR) 10 MG tablet Take 10 mg by mouth at bedtime.     . predniSONE (DELTASONE) 10 MG tablet Take 10 mg by mouth daily.    . predniSONE (DELTASONE) 5 MG tablet TAKE 1 TAB EVERY OTHER DAY, ALTERNATING WITH 2 TABS EVERY OTHER DAY    . sitaGLIPtin (JANUVIA) 100 MG tablet Take 1 tablet (100 mg total) by mouth daily. 30 tablet 6  . SYMBICORT 160-4.5 MCG/ACT inhaler Inhale 2 puffs 2 (two) times daily into the lungs.      No current facility-administered medications for this visit.     Allergies:   Clindamycin/lincomycin; Iodine; Penicillins; Sulfa antibiotics; Iodine; Ivp dye [iodinated diagnostic agents]; Penicillins; Spiriferis; and Sulfa antibiotics    Social History:  The patient  reports that he quit smoking about 49 years ago. His smoking use included cigarettes. He has a 8.00 pack-year smoking history. he has never used smokeless tobacco. He reports that he does  not drink alcohol or use drugs.   Family History:  The patient's family history includes Clotting disorder in his mother; Heart disease in his mother; Hypertension in his sister.    ROS:  Please see the history of present illness.   Otherwise, review of systems are positive for none.   All other systems are reviewed and negative.    PHYSICAL EXAM: VS:  BP 106/80 (BP Location: Left Arm, Patient Position: Sitting, Cuff Size: Normal)   Pulse (!) 115   Ht _0  (1.803 m)  Wt 227 lb (103 kg)   BMI 31.66 kg/m  , BMI Body mass index is 31.66 kg/m. GEN: Well nourished, well developed, in no acute distress  HEENT: normal  Neck: no JVD, carotid bruits, or masses Cardiac: Regular rate and rhythm but tachycardic; no murmurs, rubs, or gallops, +1 edema Respiratory: Lungs are clear, normal work of breathing GI: soft, nontender, nondistended, + BS MS: no deformity or atrophy  Skin: warm and dry, no rash Neuro:  Strength and sensation are intact Psych: euthymic mood, full affect   EKG:  EKG is ordered today. The ekg ordered today demonstrates ectopic atrial tachycardia with a heart rate of 115 bpm  Recent Labs: 05/07/2017: ALT 15; BUN 24; Creat 1.15; Hemoglobin 14.2; Platelets 184; Potassium 4.2; Sodium 141; TSH 4.69    Lipid Panel    Component Value Date/Time   CHOL 158 05/07/2017 0808   CHOL 139 12/14/2014 0834   TRIG 64 05/07/2017 0808   HDL 67 05/07/2017 0808   HDL 43 12/14/2014 0834   CHOLHDL 2.4 05/07/2017 0808   LDLCALC 71 12/14/2014 0834      Wt Readings from Last 3 Encounters:  07/06/17 227 lb (103 kg)  07/04/17 227 lb (103 kg)  05/14/17 229 lb (103.9 kg)        ASSESSMENT AND PLAN:   1. Paroxysmal atrial fibrillation: He is currently on anticoagulation with Eliquis.    2.  Paroxysmal atrial tachycardia: He is in atrial tachycardia at the present time with a heart rate of 115 bpm.  I elected to resume metoprolol at 25 mg twice daily.  Amiodarone was discontinued  last year.  3. Bilateral leg edema: Suspected chronic venous insufficiency. This is stable.  4. Coronary artery disease involving native coronary arteries without angina: He has no anginal symptoms.   5. Essential hypertension: Blood pressure is well controlled.  Given the addition of metoprolol, I elected to decrease losartan to 50 mg once daily.  6. Hyperlipidemia: Continue treatment with atorvastatin. Most recent LDL was 77.  We should consider increasing the dose of atorvastatin.   Disposition:   FU with me in 3 months  Signed,  [name], MD  07/06/2017 2:00 PM    [place]

## 2017-07-06 NOTE — Patient Instructions (Addendum)
Medication Instructions:  Your physician has recommended you make the following change in your medication:  START metoprolol 25mg  once daily DECREASE losartan to 50mg  once daily    Labwork: none  Testing/Procedures: none  Follow-Up: Your physician recommends that you schedule a follow-up appointment in: 3 months with Dr. Fletcher Anon.    Any Other Special Instructions Will Be Listed Below (If Applicable).     If you need a refill on your cardiac medications before your next appointment, please call your pharmacy.   Low-Sodium Eating Plan Sodium, which is an element that makes up salt, helps you maintain a healthy balance of fluids in your body. Too much sodium can increase your blood pressure and cause fluid and waste to be held in your body. Your health care provider or dietitian may recommend following this plan if you have high blood pressure (hypertension), kidney disease, liver disease, or heart failure. Eating less sodium can help lower your blood pressure, reduce swelling, and protect your heart, liver, and kidneys. What are tips for following this plan? General guidelines  Most people on this plan should limit their sodium intake to 1,500-2,000 mg (milligrams) of sodium each day. Reading food labels  The Nutrition Facts label lists the amount of sodium in one serving of the food. If you eat more than one serving, you must multiply the listed amount of sodium by the number of servings.  Choose foods with less than 140 mg of sodium per serving.  Avoid foods with 300 mg of sodium or more per serving. Shopping  Look for lower-sodium products, often labeled as "low-sodium" or "no salt added."  Always check the sodium content even if foods are labeled as "unsalted" or "no salt added".  Buy fresh foods. ? Avoid canned foods and premade or frozen meals. ? Avoid canned, cured, or processed meats  Buy breads that have less than 80 mg of sodium per slice. Cooking  Eat more  home-cooked food and less restaurant, buffet, and fast food.  Avoid adding salt when cooking. Use salt-free seasonings or herbs instead of table salt or sea salt. Check with your health care provider or pharmacist before using salt substitutes.  Cook with plant-based oils, such as canola, sunflower, or olive oil. Meal planning  When eating at a restaurant, ask that your food be prepared with less salt or no salt, if possible.  Avoid foods that contain MSG (monosodium glutamate). MSG is sometimes added to [place] food, bouillon, and some canned foods. What foods are recommended? The items listed may not be a complete list. Talk with your dietitian about what dietary choices are best for you. Grains Low-sodium cereals, including oats, puffed wheat and rice, and shredded wheat. Low-sodium crackers. Unsalted rice. Unsalted pasta. Low-sodium bread. Whole-grain breads and whole-grain pasta. Vegetables Fresh or frozen vegetables. "No salt added" canned vegetables. "No salt added" tomato sauce and paste. Low-sodium or reduced-sodium tomato and vegetable juice. Fruits Fresh, frozen, or canned fruit. Fruit juice. Meats and other protein foods Fresh or frozen (no salt added) meat, poultry, seafood, and fish. Low-sodium canned tuna and salmon. Unsalted nuts. Dried peas, beans, and lentils without added salt. Unsalted canned beans. Eggs. Unsalted nut butters. Dairy Milk. Soy milk. Cheese that is naturally low in sodium, such as ricotta cheese, fresh mozzarella, or Swiss cheese Low-sodium or reduced-sodium cheese. Cream cheese. Yogurt. Fats and oils Unsalted butter. Unsalted margarine with no trans fat. Vegetable oils such as canola or olive oils. Seasonings and other foods Fresh and dried herbs  and spices. Salt-free seasonings. Low-sodium mustard and ketchup. Sodium-free salad dressing. Sodium-free light mayonnaise. Fresh or refrigerated horseradish. Lemon juice. Vinegar. Homemade, reduced-sodium, or  low-sodium soups. Unsalted popcorn and pretzels. Low-salt or salt-free chips. What foods are not recommended? The items listed may not be a complete list. Talk with your dietitian about what dietary choices are best for you. Grains Instant hot cereals. Bread stuffing, pancake, and biscuit mixes. Croutons. Seasoned rice or pasta mixes. Noodle soup cups. Boxed or frozen macaroni and cheese. Regular salted crackers. Self-rising flour. Vegetables Sauerkraut, pickled vegetables, and relishes. Olives. [place] fries. Onion rings. Regular canned vegetables (not low-sodium or reduced-sodium). Regular canned tomato sauce and paste (not low-sodium or reduced-sodium). Regular tomato and vegetable juice (not low-sodium or reduced-sodium). Frozen vegetables in sauces. Meats and other protein foods Meat or fish that is salted, canned, smoked, spiced, or pickled. Bacon, ham, sausage, hotdogs, corned beef, chipped beef, packaged lunch meats, salt pork, jerky, pickled herring, anchovies, regular canned tuna, sardines, salted nuts. Dairy Processed cheese and cheese spreads. Cheese curds. Blue cheese. Feta cheese. String cheese. Regular cottage cheese. Buttermilk. Canned milk. Fats and oils Salted butter. Regular margarine. Ghee. Bacon fat. Seasonings and other foods Onion salt, garlic salt, seasoned salt, table salt, and sea salt. Canned and packaged gravies. Worcestershire sauce. Tartar sauce. Barbecue sauce. Teriyaki sauce. Soy sauce, including reduced-sodium. Steak sauce. Fish sauce. Oyster sauce. Cocktail sauce. Horseradish that you find on the shelf. Regular ketchup and mustard. Meat flavorings and tenderizers. Bouillon cubes. Hot sauce and Tabasco sauce. Premade or packaged marinades. Premade or packaged taco seasonings. Relishes. Regular salad dressings. Salsa. Potato and tortilla chips. Corn chips and puffs. Salted popcorn and pretzels. Canned or dried soups. Pizza. Frozen entrees and pot pies. Summary  Eating  less sodium can help lower your blood pressure, reduce swelling, and protect your heart, liver, and kidneys.  Most people on this plan should limit their sodium intake to 1,500-2,000 mg (milligrams) of sodium each day.  Canned, boxed, and frozen foods are high in sodium. Restaurant foods, fast foods, and pizza are also very high in sodium. You also get sodium by adding salt to food.  Try to cook at home, eat more fresh fruits and vegetables, and eat less fast food, canned, processed, or prepared foods. This information is not intended to replace advice given to you by your health care provider. Make sure you discuss any questions you have with your health care provider. Document Released: 10/07/2001 Document Revised: 04/10/2016 Document Reviewed: 04/10/2016 Elsevier Interactive Patient Education  2018 [place] With Less [employer] with less salt is one way to reduce the amount of sodium you get from food. Depending on your condition and overall health, your health care provider or diet and nutrition specialist (dietitian) may recommend that you reduce your sodium intake. Most people should have less than 2,300 milligrams (mg) of sodium each day. If you have high blood pressure (hypertension), you may need to limit your sodium to 1,500 mg each day. Follow the tips below to help reduce your sodium intake. What do I need to know about cooking with less salt? Shopping  Buy sodium-free or low-sodium products. Look for the following words on food labels: ? Low-sodium. ? Sodium-free. ? Reduced-sodium. ? No salt added. ? Unsalted.  Buy fresh or frozen vegetables. Avoid canned vegetables.  Avoid buying meats or protein foods that have been injected with broth or saline solution.  Avoid cured or smoked meats, such as hot dogs, bacon,  salami, ham, and bologna. Reading food labels  Check the food label before buying or using packaged ingredients.  Look for products with no more  than 140 mg of sodium in one serving.  Do not choose foods with salt as one of the first three ingredients on the ingredients list. If salt is one of the first three ingredients, it usually means the item is high in sodium, because ingredients are listed in order of amount in the food item. Cooking  Use herbs, seasonings without salt, and spices as substitutes for salt in foods.  Use sodium-free baking soda when baking.  Grill, braise, or roast foods to add flavor with less salt.  Avoid adding salt to pasta, rice, or hot cereals while cooking.  Drain and rinse canned vegetables before use.  Avoid adding salt when cooking sweets and desserts.  Cook with low-sodium ingredients. What are some salt alternatives? The following are herbs, seasonings, and spices that can be used instead of salt to give taste to your food. Herbs should be fresh or dried. Do not choose packaged mixes. Next to the name of the herb, spice, or seasoning are some examples of foods you can pair it with. Herbs  [place] leaves - Soups, meat and vegetable dishes, and spaghetti sauce.  Basil - [employer], soups, pasta, and fish dishes.  Cilantro - Meat, poultry, and vegetable dishes.  Chili powder - Marinades and Mexican dishes.  Chives - Salad dressings and potato dishes.  Cumin - Mexican dishes, couscous, and meat dishes.  Dill - Fish dishes, sauces, and salads.  Fennel - Meat and vegetable dishes, breads, and cookies.  Garlic (do not use garlic salt) - [place] dishes, meat dishes, salad dressings, and sauces.  Marjoram - Soups, potato dishes, and meat dishes.  Oregano - Pizza and spaghetti sauce.  Parsley - Salads, soups, pasta, and meat dishes.  Rosemary - [place] dishes, salad dressings, soups, and red meats.  Saffron - Fish dishes, pasta, and some poultry dishes.  Sage - Stuffings and sauces.  Tarragon - Fish and [employer].  Thyme - Stuffing, meat, and fish dishes. Seasonings  Lemon  juice - Fish dishes, poultry dishes, vegetables, and salads.  Vinegar - Salad dressings, vegetables, and fish dishes. Spices  Cinnamon - Sweet dishes, such as cakes, cookies, and puddings.  Cloves - Gingerbread, puddings, and marinades for meats.  Curry - Vegetable dishes, fish and poultry dishes, and stir-fry dishes.  Ginger - Vegetables dishes, fish dishes, and stir-fry dishes.  Nutmeg - Pasta, vegetables, poultry, fish dishes, and custard. What are some low-sodium ingredients and foods?  Fresh or frozen fruits and vegetables with no sauce added.  Fresh or frozen whole meats, poultry, and fish with no sauce added.  Eggs.  Noodles, pasta, quinoa, rice.  Shredded or puffed wheat or puffed rice.  Regular or quick oats.  Milk, yogurt, hard cheeses, and low-sodium cheeses. Good cheese choices include Swiss, [place]. Always check the label for the serving size and sodium content.  Unsalted butter or margarine.  Unsalted nuts.  Sherbet or ice cream (keep to  cup per serving).  Homemade pudding.  Sodium-free baking soda and baking powder. This is not a complete list of low-sodium ingredients and foods. Contact your dietitian for more options. Summary  Cooking with less salt is one way to reduce the amount of sodium that you get from food.  Buy sodium-free or low-sodium products.  Check the food label before using or buying packaged ingredients.  Use herbs, seasonings without salt, and spices as substitutes for salt in foods. This information is not intended to replace advice given to you by your health care provider. Make sure you discuss any questions you have with your health care provider. Document Released: 04/17/2005 Document Revised: 04/25/2016 Document Reviewed: 04/25/2016 Elsevier Interactive Patient Education  2017 [place].  Heart-Healthy Eating Plan Many factors influence your heart health, including eating and exercise habits.  Heart (coronary) risk increases with abnormal blood fat (lipid) levels. Heart-healthy meal planning includes limiting unhealthy fats, increasing healthy fats, and making other small dietary changes. This includes maintaining a healthy body weight to help keep lipid levels within a normal range. What is my plan? Your health care provider recommends that you:  Get no more than _________% of the total calories in your daily diet from fat.  Limit your intake of saturated fat to less than _________% of your total calories each day.  Limit the amount of cholesterol in your diet to less than _________ mg per day.  What types of fat should I choose?  Choose healthy fats more often. Choose monounsaturated and polyunsaturated fats, such as olive oil and canola oil, flaxseeds, walnuts, almonds, and seeds.  Eat more omega-3 fats. Good choices include salmon, mackerel, sardines, tuna, flaxseed oil, and ground flaxseeds. Aim to eat fish at least two times each week.  Limit saturated fats. Saturated fats are primarily found in animal products, such as meats, butter, and cream. Plant sources of saturated fats include palm oil, palm kernel oil, and coconut oil.  Avoid foods with partially hydrogenated oils in them. These contain trans fats. Examples of foods that contain trans fats are stick margarine, some tub margarines, cookies, crackers, and other baked goods. What general guidelines do I need to follow?  Check food labels carefully to identify foods with trans fats or high amounts of saturated fat.  Fill one half of your plate with vegetables and green salads. Eat 4-5 servings of vegetables per day. A serving of vegetables equals 1 cup of raw leafy vegetables,  cup of raw or cooked cut-up vegetables, or  cup of vegetable juice.  Fill one fourth of your plate with whole grains. Look for the word "whole" as the first word in the ingredient list.  Fill one fourth of your plate with lean protein  foods.  Eat 4-5 servings of fruit per day. A serving of fruit equals one medium whole fruit,  cup of dried fruit,  cup of fresh, frozen, or canned fruit, or  cup of 100% fruit juice.  Eat more foods that contain soluble fiber. Examples of foods that contain this type of fiber are apples, broccoli, carrots, beans, peas, and barley. Aim to get 20-30 g of fiber per day.  Eat more home-cooked food and less restaurant, buffet, and fast food.  Limit or avoid alcohol.  Limit foods that are high in starch and sugar.  Avoid fried foods.  Cook foods by using methods other than frying. Baking, boiling, grilling, and broiling are all great options. Other fat-reducing suggestions include: ? Removing the skin from poultry. ? Removing all visible fats from meats. ? Skimming the fat off of stews, soups, and gravies before serving them. ? Steaming vegetables in water or broth.  Lose weight if you are overweight. Losing just 5-10% of your initial body weight can help your overall health and prevent diseases such as diabetes and heart disease.  Increase your consumption of nuts, legumes, and seeds to 4-5  servings per week. One serving of dried beans or legumes equals  cup after being cooked, one serving of nuts equals 1 ounces, and one serving of seeds equals  ounce or 1 tablespoon.  You may need to monitor your salt (sodium) intake, especially if you have high blood pressure. Talk with your health care provider or dietitian to get more information about reducing sodium. What foods can I eat? Grains  Breads, including [place], white, pita, wheat, raisin, rye, oatmeal, and [place]. Tortillas that are neither fried nor made with lard or trans fat. Low-fat rolls, including hotdog and hamburger buns and English muffins. Biscuits. Muffins. Waffles. Pancakes. Light popcorn. Whole-grain cereals. Flatbread. Melba toast. Pretzels. Breadsticks. Rusks. Low-fat snacks and crackers, including oyster, saltine,  matzo, graham, animal, and rye. Rice and pasta, including brown rice and those that are made with whole wheat. Vegetables All vegetables. Fruits All fruits, but limit coconut. Meats and Other Protein Sources Lean, well-trimmed beef, veal, pork, and lamb. Chicken and [place] without skin. All fish and shellfish. Wild duck, rabbit, pheasant, and venison. Egg whites or low-cholesterol egg substitutes. Dried beans, peas, lentils, and tofu.Seeds and most nuts. Dairy Low-fat or nonfat cheeses, including ricotta, string, and mozzarella. Skim or 1% milk that is liquid, powdered, or evaporated. Buttermilk that is made with low-fat milk. Nonfat or low-fat yogurt. Beverages Mineral water. Diet carbonated beverages. Sweets and Desserts Sherbets and fruit ices. Honey, jam, marmalade, jelly, and syrups. Meringues and gelatins. Pure sugar candy, such as hard candy, jelly beans, gumdrops, mints, marshmallows, and small amounts of dark chocolate. [employer] Eat all sweets and desserts in moderation. Fats and Oils Nonhydrogenated (trans-free) margarines. Vegetable oils, including soybean, sesame, sunflower, olive, peanut, safflower, corn, canola, and cottonseed. Salad dressings or mayonnaise that are made with a vegetable oil. Limit added fats and oils that you use for cooking, baking, salads, and as spreads. Other Cocoa powder. Coffee and tea. All seasonings and condiments. The items listed above may not be a complete list of recommended foods or beverages. Contact your dietitian for more options. What foods are not recommended? Grains Breads that are made with saturated or trans fats, oils, or whole milk. Croissants. Butter rolls. Cheese breads. Sweet rolls. Donuts. Buttered popcorn. Chow mein noodles. High-fat crackers, such as cheese or butter crackers. Meats and Other Protein Sources Fatty meats, such as hotdogs, short ribs, sausage, spareribs, bacon, ribeye roast or steak, and mutton. High-fat deli  meats, such as salami and bologna. Caviar. Domestic duck and goose. Organ meats, such as kidney, liver, sweetbreads, brains, gizzard, chitterlings, and heart. Dairy Cream, sour cream, cream cheese, and creamed cottage cheese. Whole milk cheeses, including blue (bleu), Monterey Jack, [place], [place], American, [place], Swiss, [place], [place], and [place]. Whole or 2% milk that is liquid, evaporated, or condensed. Whole buttermilk. Cream sauce or high-fat cheese sauce. Yogurt that is made from whole milk. Beverages Regular sodas and drinks with added sugar. Sweets and Desserts Frosting. Pudding. Cookies. Cakes other than angel food cake. Candy that has milk chocolate or white chocolate, hydrogenated fat, butter, coconut, or unknown ingredients. Buttered syrups. Full-fat ice cream or ice cream drinks. Fats and Oils Gravy that has suet, meat fat, or shortening. Cocoa butter, hydrogenated oils, palm oil, coconut oil, palm kernel oil. These can often be found in baked products, candy, fried foods, nondairy creamers, and whipped toppings. Solid fats and shortenings, including bacon fat, salt pork, lard, and butter. Nondairy cream substitutes, such as coffee creamers and sour cream substitutes. Salad  dressings that are made of unknown oils, cheese, or sour cream. The items listed above may not be a complete list of foods and beverages to avoid. Contact your dietitian for more information. This information is not intended to replace advice given to you by your health care provider. Make sure you discuss any questions you have with your health care provider. Document Released: 01/25/2008 Document Revised: 11/05/2015 Document Reviewed: 10/09/2013 Elsevier Interactive Patient Education  [employer].

## 2017-07-10 ENCOUNTER — Other Ambulatory Visit: Payer: Self-pay

## 2017-07-10 ENCOUNTER — Telehealth: Payer: Self-pay | Admitting: Cardiovascular Disease

## 2017-07-10 MED ORDER — METOPROLOL TARTRATE 25 MG PO TABS: 25.0000 mg | ORAL_TABLET | Freq: Two times a day (BID) | ORAL | 5 refills | Status: DC

## 2017-07-10 NOTE — Telephone Encounter (Addendum)
Per Dr. Fletcher Anon, patient should take metoprolol tartrate 25mg  BID.  Discussed with patient who understands to stop taking metoprolol succinate qd and start tartrate BID. Prescription sent to [facility].

## 2017-07-31 ENCOUNTER — Other Ambulatory Visit: Payer: Self-pay

## 2017-07-31 ENCOUNTER — Ambulatory Visit: Payer: Medicare Other | Admitting: Family Medicine

## 2017-07-31 ENCOUNTER — Encounter: Payer: Self-pay | Admitting: Family Medicine

## 2017-07-31 VITALS — BP 103/65 | HR 103 | Temp 99.3°F | Resp 16 | Ht 71.0 in | Wt 216.0 lb

## 2017-07-31 DIAGNOSIS — R197 Diarrhea, unspecified: Secondary | ICD-10-CM

## 2017-07-31 DIAGNOSIS — R300 Dysuria: Secondary | ICD-10-CM

## 2017-07-31 DIAGNOSIS — M545 Low back pain, unspecified: Secondary | ICD-10-CM

## 2017-07-31 DIAGNOSIS — R509 Fever, unspecified: Secondary | ICD-10-CM

## 2017-07-31 DIAGNOSIS — R1030 Lower abdominal pain, unspecified: Secondary | ICD-10-CM | POA: Diagnosis not present

## 2017-07-31 DIAGNOSIS — R5381 Other malaise: Secondary | ICD-10-CM | POA: Diagnosis not present

## 2017-07-31 DIAGNOSIS — R110 Nausea: Secondary | ICD-10-CM | POA: Diagnosis not present

## 2017-07-31 LAB — CBC WITH DIFFERENTIAL/PLATELET
BASOS PCT: 0.6 %
Basophils Absolute: 50 cells/uL (ref 0–200)
EOS ABS: 232 {cells}/uL (ref 15–500)
Eosinophils Relative: 2.8 %
HEMATOCRIT: 45.3 % (ref 38.5–50.0)
HEMOGLOBIN: 15.9 g/dL (ref 13.2–17.1)
LYMPHS ABS: 772 {cells}/uL — AB (ref 850–3900)
MCH: 30.4 pg (ref 27.0–33.0)
MCHC: 35.1 g/dL (ref 32.0–36.0)
MCV: 86.6 fL (ref 80.0–100.0)
MPV: 11.3 fL (ref 7.5–12.5)
Monocytes Relative: 10.5 %
NEUTROS ABS: 6374 {cells}/uL (ref 1500–7800)
Neutrophils Relative %: 76.8 %
Platelets: 146 10*3/uL (ref 140–400)
RBC: 5.23 10*6/uL (ref 4.20–5.80)
RDW: 13.1 % (ref 11.0–15.0)
TOTAL LYMPHOCYTE: 9.3 %
WBC: 8.3 10*3/uL (ref 3.8–10.8)
WBCMIX: 872 {cells}/uL (ref 200–950)

## 2017-07-31 LAB — BASIC METABOLIC PANEL WITH GFR
BUN/Creatinine Ratio: 13 (calc) (ref 6–22)
BUN: 17 mg/dL (ref 7–25)
CO2: 28 mmol/L (ref 20–32)
CREATININE: 1.27 mg/dL — AB (ref 0.70–1.18)
Calcium: 8.7 mg/dL (ref 8.6–10.3)
Chloride: 108 mmol/L (ref 98–110)
GFR, EST NON AFRICAN AMERICAN: 57 mL/min/{1.73_m2} — AB (ref >=60)
GFR, Est African American: 66 mL/min/{1.73_m2} (ref >=60)
Glucose, Bld: 147 mg/dL — ABNORMAL HIGH (ref 65–99)
Potassium: 4.8 mmol/L (ref 3.5–5.3)
SODIUM: 142 mmol/L (ref 135–146)

## 2017-07-31 LAB — POCT URINALYSIS DIPSTICK
BILIRUBIN UA: NEGATIVE
GLUCOSE UA: NEGATIVE
Leukocytes, UA: NEGATIVE
Nitrite, UA: NEGATIVE
RBC UA: NEGATIVE
Urobilinogen, UA: 2 E.U./dL — AB
pH, UA: 7 (ref 5.0–8.0)

## 2017-07-31 MED ORDER — DICYCLOMINE HCL 10 MG PO CAPS: 10.0000 mg | ORAL_CAPSULE | Freq: Three times a day (TID) | ORAL | 0 refills | Status: DC

## 2017-07-31 NOTE — Progress Notes (Signed)
Subjective:    Patient ID: Philip [name]., male    DOB: [birth year], 72 y.o.   MRN: [id number]  Philip BLASCO Sr. is a 72 y.o. male presenting on 07/31/2017 for Back Pain (lower back pain) and Abdominal Pain  Patient presents for a same day appointment. Patient provides majority of history, he is accompanied by wife, Philip [name], who provides additional history.  HPI   ABDOMINAL PAIN CRAMPING / DIARRHEA / BACK PAIN / FEVER / DYSURIA Reports symptoms started yesterday with lunch he felt some nausea and upset stomach, he had a chicken pot pie, he thinks maybe had food poisoning, wife ate different food there and she is asymptomatic. His symptoms progressed to abdominal pain and cramping, then some loose stool diarrhea (normal amount, not excessive, non bloody no mucus), did not improve his symptoms. He was resting today in bed for longer with some improvement, he did not have much energy and felt fatigued - Some history of intermittent loose bowels - Additional complaint he is describing bilateral low back pain with some radiation across to front of abdomen - Associated with slight light headedness symptom - Admits he stayed in bed this morning felt fatigue and overall not well - Admits mild fever last night up to 100.somethingF, chills - Admits urinary frequency and dysuria at times - Admits diarrhea seems persistent not worse - Denies dyspnea, coughing, body aches, chest pain, headaches, ear pain sore throat  Health Maintenance: UTD Flu vaccine 01/2017  Depression screen [facility] 2/9 07/31/2017 05/14/2017 09/19/2016  Decreased Interest 0 0 0  Down, Depressed, Hopeless 0 0 0  PHQ - 2 Score 0 0 0  Altered sleeping 0 - 0  Tired, decreased energy 0 - 1  Change in appetite 0 - 0  Feeling bad or failure about yourself  0 - 0  Trouble concentrating 0 - 0  Moving slowly or fidgety/restless 0 - 0  Suicidal thoughts 0 - 0  PHQ-9 Score 0 - 1  Difficult doing work/chores Not difficult at all - -     Social History   Tobacco Use  . Smoking status: Former Smoker    Packs/day: 1.00    Years: 8.00    Pack years: 8.00    Types: Cigarettes    Last attempt to quit: 12/31/1967    Years since quitting: 49.6  . Smokeless tobacco: Never Used  Substance Use Topics  . Alcohol use: No    Alcohol/week: 0.0 oz    Comment: 05/06/2012 "last alcohol several years ago; never had problem wit"  . Drug use: No    Review of Systems Per HPI unless specifically indicated above     Objective:    BP 103/65 (BP Location: Right Arm, Patient Position: Sitting, Cuff Size: Normal)   Pulse (!) 103   Temp 99.3 F (37.4 C)   Resp 16   Ht 5\' 11"  (1.803 m)   Wt 216 lb (98 kg)   BMI 30.13 kg/m   Wt Readings from Last 3 Encounters:  07/31/17 216 lb (98 kg)  07/06/17 227 lb (103 kg)  07/04/17 227 lb (103 kg)    Physical Exam  Constitutional: He is oriented to person, place, and time. He appears well-developed and well-nourished. No distress.  Well-appearing, comfortable, cooperative  HENT:  Head: Normocephalic and atraumatic.  Mouth/Throat: Oropharynx is clear and moist.  Frontal / maxillary sinuses non-tender. Nares patent without purulence or edema. L TM has mild erythema without effusion or bulging. R TM clear  without erythema, effusion or bulging. Oropharynx clear without erythema, exudates, edema or asymmetry.  Eyes: Conjunctivae are normal. Right eye exhibits no discharge. Left eye exhibits no discharge.  Neck: Normal range of motion. Neck supple.  Cardiovascular: Regular rhythm, normal heart sounds and intact distal pulses.  No murmur heard. Mild tachycardia  Pulmonary/Chest: Effort normal and breath sounds normal. No respiratory distress. He has no wheezes. He has no rales.  Good air movement  Abdominal: Soft. Bowel sounds are normal. He exhibits no distension and no mass. There is no tenderness.  Musculoskeletal: Normal range of motion. He exhibits no edema.  Left mid back pain  paraspinal muscles slightly spasm and tender otherwise not consistent with CVAT  Lymphadenopathy:    He has no cervical adenopathy.  Neurological: He is alert and oriented to person, place, and time.  Skin: Skin is warm and dry. No rash noted. He is not diaphoretic. No erythema.  Left hand, has superficial flap of skin peeled back in abrasion moderate sized area, covered with non adhesive bandage and wrap, examined today on dorsal hand without sign of secondary infection  Left forearm has scab and slight ecchymosis from prior injury now healing  Psychiatric: His behavior is normal.  Well groomed, good eye contact, normal speech and thoughts  Nursing note and vitals reviewed.  Results for orders placed or performed in visit on 07/31/17  POCT urinalysis dipstick  Result Value Ref Range   Color, UA amber    Clarity, UA clear    Glucose, UA negative    Bilirubin, UA negative    Ketones, UA small    Spec Grav, UA <=1.005 (A) 1.010 - 1.025   Blood, UA negative    pH, UA 7.0 5.0 - 8.0   Protein, UA trace    Urobilinogen, UA 2.0 (A) 0.2 or 1.0 E.U./dL   Nitrite, UA negative    Leukocytes, UA Negative Negative   Appearance clear    Odor none       Assessment & Plan:   Problem List Items Addressed This Visit    None    Visit Diagnoses    Lower abdominal pain    -  Primary   Relevant Medications   dicyclomine (BENTYL) 10 MG capsule   Other Relevant Orders   BASIC METABOLIC PANEL WITH GFR   CBC with Differential/Platelet   Fever and chills       Relevant Orders   POCT urinalysis dipstick (Completed)   Urine Culture   Dysuria       Relevant Orders   POCT urinalysis dipstick (Completed)   Urine Culture   Nausea without vomiting       Diarrhea, unspecified type       Malaise       Acute left-sided low back pain without sciatica       Relevant Orders   BASIC METABOLIC PANEL WITH GFR   CBC with Differential/Platelet      Uncertain etiology for current constellation of  symptoms, seems viral by nature vs possible food poisoning, but this does not explain fever and other non GI symptoms. Possible UTI, see plan below, considered pyelo given fever nausea back or flank pain, but initial urine and other symptoms not entirely consistent with this. - Clinically does not appear consistent with flu, without respiratory symptoms, s/p flu vaccine, no known sick contacts, offered flu test but agreed to decline this for today unless symptoms change  Plan Check UA today - reviewed results, see above, not  consistent with UTI Given fever and urinary symptoms, will proceed to check Urine Culture, pending result - no empiric antibiotics today Check BMET and CBC today - will f/u results Rx Dicyclomine for abdominal pain and cramping PRN - may benefit if dietary vs food poisoning Recommend OTC Tylenol PRN for pain and fever Offered Zofran for nausea without vomiting, he declined Improve hydration fluid intake, gatorade Follow-up sooner if not improving or worsening, return criteria given, may go to hospital ED or Urgent Care if notable worsening   Meds ordered this encounter  Medications  . dicyclomine (BENTYL) 10 MG capsule    Sig: Take 1 capsule (10 mg total) by mouth 4 (four) times daily -  before meals and at bedtime.    Dispense:  30 capsule    Refill:  0      Follow up plan: Return in about 1 week (around 08/07/2017), or if symptoms worsen or fail to improve, for abdominal pain, back pain, nausea, fever.  [name], [facility] 07/31/2017, 5:12 PM

## 2017-07-31 NOTE — Patient Instructions (Addendum)
Thank you for coming to the office today.  Call us back tomorrow or next day if change mind  And would like nausea medicine - Zofran called in  Urine does not show infection - will send for Culture - if is infected we will call you with antibiotic  Stay tuned for blood test results.  Does not seem like Flu infection, however if worsening in 24 hours with fever chills sweats whole body aches can return or go to Urgent Care or Hospital ED to get tested and treated  For abdominal cramping and diarrhea, can take new rx Dicyclomine (Bentyl) 10mg  with meals and at bedtime  Recommend to start taking Tylenol Extra Strength 500mg  tabs - take 1 to 2 tabs per dose (max 1000mg ) every 6-8 hours for pain (take regularly, don't skip a dose for next 7 days), max 24 hour daily dose is 6 tablets or 3000mg . In the future you can repeat the same everyday Tylenol course for 1-2 weeks at a time.   Please schedule a Follow-up Appointment to: Return in about 1 week (around 08/07/2017), or if symptoms worsen or fail to improve, for abdominal pain, back pain, nausea, fever.  If you have any other questions or concerns, please feel free to call the office or send a message through [place]. You may also schedule an earlier appointment if necessary.  Additionally, you may be receiving a survey about your experience at our office within a few days to 1 week by e-mail or mail. We value your feedback.  [name], DO [place]

## 2017-08-01 LAB — URINE CULTURE
MICRO NUMBER: 90406438
Result:: NO GROWTH
SPECIMEN QUALITY: ADEQUATE

## 2017-08-06 DIAGNOSIS — H2511 Age-related nuclear cataract, right eye: Secondary | ICD-10-CM | POA: Diagnosis not present

## 2017-08-08 ENCOUNTER — Encounter: Payer: Self-pay | Admitting: *Deleted

## 2017-08-15 ENCOUNTER — Ambulatory Visit
Admission: RE | Admit: 2017-08-15 | Discharge: 2017-08-15 | Disposition: A | Discharge status: Home / Self Care | Payer: Medicare Other | Source: Ambulatory Visit | Attending: Ophthalmology | Admitting: Ophthalmology

## 2017-08-15 ENCOUNTER — Ambulatory Visit: Payer: Medicare Other | Admitting: Certified Registered Nurse Anesthetist

## 2017-08-15 ENCOUNTER — Encounter: Payer: Self-pay | Admitting: Certified Registered Nurse Anesthetist

## 2017-08-15 ENCOUNTER — Encounter
Admission: RE | Disposition: A | Discharge status: Home / Self Care | Payer: Self-pay | Source: Ambulatory Visit | Attending: Ophthalmology

## 2017-08-15 DIAGNOSIS — J449 Chronic obstructive pulmonary disease, unspecified: Secondary | ICD-10-CM | POA: Diagnosis not present

## 2017-08-15 DIAGNOSIS — Z87891 Personal history of nicotine dependence: Secondary | ICD-10-CM | POA: Diagnosis not present

## 2017-08-15 DIAGNOSIS — Z951 Presence of aortocoronary bypass graft: Secondary | ICD-10-CM | POA: Diagnosis not present

## 2017-08-15 DIAGNOSIS — J45909 Unspecified asthma, uncomplicated: Secondary | ICD-10-CM | POA: Diagnosis not present

## 2017-08-15 DIAGNOSIS — E039 Hypothyroidism, unspecified: Secondary | ICD-10-CM | POA: Insufficient documentation

## 2017-08-15 DIAGNOSIS — E7800 Pure hypercholesterolemia, unspecified: Secondary | ICD-10-CM | POA: Insufficient documentation

## 2017-08-15 DIAGNOSIS — Z79899 Other long term (current) drug therapy: Secondary | ICD-10-CM | POA: Diagnosis not present

## 2017-08-15 DIAGNOSIS — I2510 Atherosclerotic heart disease of native coronary artery without angina pectoris: Secondary | ICD-10-CM | POA: Diagnosis not present

## 2017-08-15 DIAGNOSIS — H2511 Age-related nuclear cataract, right eye: Secondary | ICD-10-CM | POA: Insufficient documentation

## 2017-08-15 DIAGNOSIS — Z7902 Long term (current) use of antithrombotics/antiplatelets: Secondary | ICD-10-CM | POA: Diagnosis not present

## 2017-08-15 DIAGNOSIS — E119 Type 2 diabetes mellitus without complications: Secondary | ICD-10-CM | POA: Diagnosis not present

## 2017-08-15 DIAGNOSIS — Z794 Long term (current) use of insulin: Secondary | ICD-10-CM | POA: Diagnosis not present

## 2017-08-15 DIAGNOSIS — I10 Essential (primary) hypertension: Secondary | ICD-10-CM | POA: Insufficient documentation

## 2017-08-15 DIAGNOSIS — I4891 Unspecified atrial fibrillation: Secondary | ICD-10-CM | POA: Diagnosis not present

## 2017-08-15 DIAGNOSIS — Z955 Presence of coronary angioplasty implant and graft: Secondary | ICD-10-CM | POA: Diagnosis not present

## 2017-08-15 DIAGNOSIS — G4730 Sleep apnea, unspecified: Secondary | ICD-10-CM | POA: Insufficient documentation

## 2017-08-15 HISTORY — DX: Sleep apnea, unspecified: G47.30

## 2017-08-15 HISTORY — DX: Cardiac arrhythmia, unspecified: I49.9

## 2017-08-15 HISTORY — DX: Edema, unspecified: R60.9

## 2017-08-15 HISTORY — PX: CATARACT EXTRACTION W/PHACO: SHX586

## 2017-08-15 HISTORY — DX: Dyspnea, unspecified: R06.00

## 2017-08-15 LAB — GLUCOSE, CAPILLARY: Glucose-Capillary: 131 mg/dL — ABNORMAL HIGH (ref 65–99)

## 2017-08-15 SURGERY — PHACOEMULSIFICATION, CATARACT, WITH IOL INSERTION
Anesthesia: Monitor Anesthesia Care | Site: Eye | Laterality: Right | Wound class: "Clean "

## 2017-08-15 MED ORDER — MOXIFLOXACIN HCL 0.5 % OP SOLN
OPHTHALMIC | Status: AC
  Filled 2017-08-15: qty 3

## 2017-08-15 MED ORDER — CARBACHOL 0.01 % IO SOLN
INTRAOCULAR | Status: DC | PRN
  Administered 2017-08-15: .5 mL via INTRAOCULAR

## 2017-08-15 MED ORDER — MIDAZOLAM HCL 2 MG/2ML IJ SOLN
INTRAMUSCULAR | Status: AC
Start: 2017-08-15 — End: 2017-08-15
  Filled 2017-08-15: qty 2

## 2017-08-15 MED ORDER — LIDOCAINE HCL (PF) 4 % IJ SOLN
INTRAOCULAR | Status: DC | PRN
  Administered 2017-08-15: 2 mL via OPHTHALMIC

## 2017-08-15 MED ORDER — EPINEPHRINE PF 1 MG/ML IJ SOLN
INTRAOCULAR | Status: DC | PRN
  Administered 2017-08-15: 1 mL via OPHTHALMIC

## 2017-08-15 MED ORDER — ARMC OPHTHALMIC DILATING DROPS
1.0000 "application " | OPHTHALMIC | Status: AC
  Administered 2017-08-15 (×3): 1 via OPHTHALMIC

## 2017-08-15 MED ORDER — SODIUM CHLORIDE 0.9 % IV SOLN
INTRAVENOUS | Status: DC
  Administered 2017-08-15: 08:00:00 via INTRAVENOUS

## 2017-08-15 MED ORDER — POVIDONE-IODINE 5 % OP SOLN
OPHTHALMIC | Status: DC | PRN
  Administered 2017-08-15: 1 via OPHTHALMIC

## 2017-08-15 MED ORDER — MIDAZOLAM HCL 2 MG/2ML IJ SOLN
INTRAMUSCULAR | Status: DC | PRN
  Administered 2017-08-15: 1 mg via INTRAVENOUS

## 2017-08-15 MED ORDER — NA CHONDROIT SULF-NA HYALURON 40-17 MG/ML IO SOLN
INTRAOCULAR | Status: DC | PRN
  Administered 2017-08-15: 1 mL via INTRAOCULAR

## 2017-08-15 MED ORDER — MOXIFLOXACIN HCL 0.5 % OP SOLN
OPHTHALMIC | Status: DC | PRN
  Administered 2017-08-15: .2 mL via OPHTHALMIC

## 2017-08-15 MED ORDER — MOXIFLOXACIN HCL 0.5 % OP SOLN: 1.0000 [drp] | OPHTHALMIC | Status: DC | PRN

## 2017-08-15 MED ORDER — ARMC OPHTHALMIC DILATING DROPS
OPHTHALMIC | Status: AC
  Administered 2017-08-15: 1 via OPHTHALMIC
  Filled 2017-08-15: qty 0.4

## 2017-08-15 SURGICAL SUPPLY — 16 items
GLOVE BIO SURGEON STRL SZ8 (GLOVE) ×2 IMPLANT
GLOVE BIOGEL M 6.5 STRL (GLOVE) ×2 IMPLANT
GLOVE SURG LX 8.0 MICRO (GLOVE) ×1
GLOVE SURG LX STRL 8.0 MICRO (GLOVE) ×1 IMPLANT
GOWN STRL REUS W/ TWL LRG LVL3 (GOWN DISPOSABLE) ×2 IMPLANT
GOWN STRL REUS W/TWL LRG LVL3 (GOWN DISPOSABLE) ×2
LABEL CATARACT MEDS ST (LABEL) ×2 IMPLANT
LENS IOL TECNIS ITEC 23.0 (Intraocular Lens) ×1 IMPLANT
PACK CATARACT (MISCELLANEOUS) ×2 IMPLANT
PACK CATARACT BRASINGTON LX (MISCELLANEOUS) ×2 IMPLANT
PACK EYE AFTER SURG (MISCELLANEOUS) ×2 IMPLANT
SOL BSS BAG (MISCELLANEOUS) ×2
SOLUTION BSS BAG (MISCELLANEOUS) ×1 IMPLANT
SYR 5ML LL (SYRINGE) ×2 IMPLANT
WATER STERILE IRR 250ML POUR (IV SOLUTION) ×2 IMPLANT
WIPE NON LINTING 3.25X3.25 (MISCELLANEOUS) ×2 IMPLANT

## 2017-08-15 NOTE — Anesthesia Post-op Follow-up Note (Signed)
Anesthesia QCDR form completed.        

## 2017-08-15 NOTE — Transfer of Care (Signed)
Immediate Anesthesia Transfer of Care Note  Patient: Philip DAGHER Sr.  Procedure(s) Performed: CATARACT EXTRACTION PHACO AND INTRAOCULAR LENS PLACEMENT (IOC) (Right Eye)  Patient Location: PACU  Anesthesia Type:MAC  Level of Consciousness: awake  Airway & Oxygen Therapy: Patient Spontanous Breathing  Post-op Assessment: Report given to RN and Post -op Vital signs reviewed and stable  Post vital signs: Reviewed and stable  Last Vitals:  Vitals Value Taken Time  BP    Temp    Pulse    Resp    SpO2      Last Pain:  Vitals:   08/15/17 0734  TempSrc: Tympanic         Complications: No apparent anesthesia complications

## 2017-08-15 NOTE — Anesthesia Postprocedure Evaluation (Signed)
Anesthesia Post Note  Patient: Philip WURZER Sr.  Procedure(s) Performed: CATARACT EXTRACTION PHACO AND INTRAOCULAR LENS PLACEMENT (IOC) (Right Eye)  Patient location during evaluation: Short Stay Anesthesia Type: MAC Level of consciousness: awake Pain management: pain level controlled Vital Signs Assessment: post-procedure vital signs reviewed and stable Respiratory status: spontaneous breathing Cardiovascular status: blood pressure returned to baseline and stable Anesthetic complications: no     Last Vitals:  Vitals:   08/15/17 0734 08/15/17 0857  BP: (!) 141/81 117/69  Pulse: 94 (!) 56  Resp: 18 18  Temp: (!) 35.4 C 36.7 C  SpO2: 99% 98%    Last Pain:  Vitals:   08/15/17 0857  TempSrc: Oral  PainSc: 0-No pain                 Buckner [place]

## 2017-08-15 NOTE — H&P (Signed)
All labs reviewed. Abnormal studies sent to patients PCP when indicated.  Previous H&P reviewed, patient examined, there are NO CHANGES.  Gwyndolyn Saxon Porfilio4/17/20198:32 AM

## 2017-08-15 NOTE — Discharge Instructions (Signed)
Eye Surgery Discharge Instructions  Expect mild scratchy sensation or mild soreness. DO NOT RUB YOUR EYE!  The day of surgery:  Minimal physical activity, but bed rest is not required  No reading, computer work, or close hand work  No bending, lifting, or straining.  May watch TV  For 24 hours:  No driving, legal decisions, or alcoholic beverages  Safety precautions  Eat anything you prefer: It is better to start with liquids, then soup then solid foods.  _____ Eye patch should be worn until postoperative exam tomorrow.  ____ Solar shield eyeglasses should be worn for comfort in the sunlight/patch while sleeping  Resume all regular medications including aspirin or Coumadin if these were discontinued prior to surgery. You may shower, bathe, shave, or wash your hair. Tylenol may be taken for mild discomfort.  Call your doctor if you experience significant pain, nausea, or vomiting, fever > 101 or other signs of infection. [phone] or [phone] Specific instructions:  Follow-up Information    Birder Robson, MD Follow up.   Specialty:  Ophthalmology Why:  April 18 at 10:25am Contact information: [address] [place] [address] [phone]

## 2017-08-15 NOTE — Anesthesia Preprocedure Evaluation (Signed)
Anesthesia Evaluation  Patient identified by MRN, date of birth, ID band Patient awake    Reviewed: Allergy & Precautions, H&P , NPO status , Patient's Chart, lab work & pertinent test results, reviewed documented beta blocker date and time   Airway Mallampati: II  TM Distance: >3 FB Neck ROM: full    Dental no notable dental hx. (+) Teeth Intact   Pulmonary neg pulmonary ROS, shortness of breath and with exertion, asthma , sleep apnea , COPD, former smoker,    Pulmonary exam normal breath sounds clear to auscultation       Cardiovascular Exercise Tolerance: Poor hypertension, On Medications + CAD  negative cardio ROS  + dysrhythmias  Rhythm:regular Rate:Normal     Neuro/Psych  Neuromuscular disease negative neurological ROS  negative psych ROS   GI/Hepatic negative GI ROS, Neg liver ROS,   Endo/Other  negative endocrine ROSdiabetesHypothyroidism   Renal/GU Renal disease     Musculoskeletal   Abdominal   Peds  Hematology negative hematology ROS (+)   Anesthesia Other Findings   Reproductive/Obstetrics negative OB ROS                             Anesthesia Physical Anesthesia Plan  ASA: III  Anesthesia Plan: MAC   Post-op Pain Management:    Induction:   PONV Risk Score and Plan:   Airway Management Planned:   Additional Equipment:   Intra-op Plan:   Post-operative Plan:   Informed Consent: I have reviewed the patients History and Physical, chart, labs and discussed the procedure including the risks, benefits and alternatives for the proposed anesthesia with the patient or authorized representative who has indicated his/her understanding and acceptance.     Plan Discussed with: CRNA  Anesthesia Plan Comments:         Anesthesia Quick Evaluation

## 2017-08-15 NOTE — Anesthesia Procedure Notes (Signed)
Procedure Name: MAC Date/Time: 08/15/2017 8:37 AM Performed by: Eben Burow, CRNA Pre-anesthesia Checklist: Patient identified, Emergency Drugs available, Suction available, Patient being monitored and Timeout performed Patient Re-evaluated:Patient Re-evaluated prior to induction Oxygen Delivery Method: Nasal cannula Placement Confirmation: positive ETCO2

## 2017-08-15 NOTE — Op Note (Signed)
PREOPERATIVE DIAGNOSIS:  Nuclear sclerotic cataract of the right eye.   POSTOPERATIVE DIAGNOSIS:  nuclear sclerotic cataract right eye   OPERATIVE PROCEDURE: Procedure(s): CATARACT EXTRACTION PHACO AND INTRAOCULAR LENS PLACEMENT (IOC)   SURGEON:  Birder Robson, MD.   ANESTHESIA:  Anesthesiologist: Molli Barrows, MD CRNA: Eben Burow, CRNA; Allean Found, CRNA  1.      Managed anesthesia care. 2.      0.47ml of Shugarcaine was instilled in the eye following the paracentesis.   COMPLICATIONS:  None.   TECHNIQUE:   Stop and chop   DESCRIPTION OF PROCEDURE:  The patient was examined and consented in the preoperative holding area where the aforementioned topical anesthesia was applied to the right eye and then brought back to the Operating Room where the right eye was prepped and draped in the usual sterile ophthalmic fashion and a lid speculum was placed. A paracentesis was created with the side port blade and the anterior chamber was filled with viscoelastic. A near clear corneal incision was performed with the steel keratome. A continuous curvilinear capsulorrhexis was performed with a cystotome followed by the capsulorrhexis forceps. Hydrodissection and hydrodelineation were carried out with BSS on a blunt cannula. The lens was removed in a stop and chop  technique and the remaining cortical material was removed with the irrigation-aspiration handpiece. The capsular bag was inflated with viscoelastic and the Technis [id number]  lens was placed in the capsular bag without complication. The remaining viscoelastic was removed from the eye with the irrigation-aspiration handpiece. The wounds were hydrated. The anterior chamber was flushed with Miostat and the eye was inflated to physiologic pressure. 0.43ml of Vigamox was placed in the anterior chamber. The wounds were found to be water tight. The eye was dressed with Vigamox. The patient was given protective glasses to wear throughout the day and a  shield with which to sleep tonight. The patient was also given drops with which to begin a drop regimen today and will follow-up with me in one day. Implant Name Type Inv. Item Serial No. Manufacturer Lot No. LRB No. Used  LENS IOL DIOP 23.0 - [id number] Intraocular Lens LENS IOL DIOP 23.0 [id number] AMO  Right 1   Procedure(s) with comments: CATARACT EXTRACTION PHACO AND INTRAOCULAR LENS PLACEMENT (IOC) (Right) - [place] 00:23 AP% 13.3 CDE 3.11 Fluid pack lot # [id number]  Electronically signed: Birder Robson 08/15/2017 8:54 AM

## 2017-08-20 ENCOUNTER — Ambulatory Visit: Payer: Medicare Other | Admitting: Family Medicine

## 2017-09-05 ENCOUNTER — Other Ambulatory Visit: Payer: Self-pay | Admitting: Family Medicine

## 2017-09-05 ENCOUNTER — Other Ambulatory Visit: Payer: Self-pay

## 2017-09-05 DIAGNOSIS — E1142 Type 2 diabetes mellitus with diabetic polyneuropathy: Secondary | ICD-10-CM

## 2017-09-05 DIAGNOSIS — G603 Idiopathic progressive neuropathy: Secondary | ICD-10-CM

## 2017-09-05 MED ORDER — GABAPENTIN 100 MG PO CAPS: 100.0000 mg | ORAL_CAPSULE | Freq: Three times a day (TID) | ORAL | 3 refills | Status: DC

## 2017-09-05 MED ORDER — GABAPENTIN 300 MG PO CAPS: 300.0000 mg | ORAL_CAPSULE | Freq: Three times a day (TID) | ORAL | 3 refills | Status: DC

## 2017-09-05 NOTE — Addendum Note (Signed)
Addended by: [name] on: 09/05/2017 05:10 PM   Modules accepted: Orders

## 2017-09-05 NOTE — Telephone Encounter (Addendum)
Refilled Gabapentin 100mg  TID - #270 for 90 day supply as requested  Initially sent rx 300mg  capsules, due to the an automatic refill request received from old rx that requested 300mg  capsules.  I have called patient spoke to [facility] and confirmed he is only taking 100mg  capsules TID. Advised him of the mix up and confirm he gets 100mg  only when at pharmacy next.  New rx 100mg  TID sent e-script to [facility] with detailed instructions. I also called pharmacy and spoke with Juanda Crumble pharmacist and he confirmed they have deleted the 300mg  TID order, and now only have the correct 100mg  TID.  Patient also requesting to schedule DM follow-up with me in 2 weeks on same day 5/21 as his AMW with Tiffany, he may schedule f/u DM A1c with me.  [name], DO [facility] 09/05/2017, 4:59 PM

## 2017-09-17 DIAGNOSIS — H2512 Age-related nuclear cataract, left eye: Secondary | ICD-10-CM | POA: Diagnosis not present

## 2017-09-18 ENCOUNTER — Ambulatory Visit: Payer: Medicare Other | Admitting: Family Medicine

## 2017-09-18 ENCOUNTER — Ambulatory Visit: Payer: Medicare Other

## 2017-09-18 ENCOUNTER — Encounter: Payer: Self-pay | Admitting: *Deleted

## 2017-09-18 ENCOUNTER — Encounter: Payer: Self-pay | Admitting: Family Medicine

## 2017-09-18 VITALS — BP 117/62 | HR 58 | Temp 98.8°F | Resp 16 | Ht 71.0 in | Wt 222.6 lb

## 2017-09-18 DIAGNOSIS — E1142 Type 2 diabetes mellitus with diabetic polyneuropathy: Secondary | ICD-10-CM

## 2017-09-18 DIAGNOSIS — J449 Chronic obstructive pulmonary disease, unspecified: Secondary | ICD-10-CM

## 2017-09-18 LAB — POCT GLYCOSYLATED HEMOGLOBIN (HGB A1C): Hemoglobin A1C: 7.2 % — AB (ref 4.0–5.6)

## 2017-09-18 NOTE — Patient Instructions (Addendum)
Thank you for coming to the office today.   A1c is improved now 7.2, keep up the good work overall I am impressed with your diet and lifestyle change  Continue Metformin 500mg  x 2 - twice daily  For now - 1 month use up your supply of Januvia 100mg  daily  After finish 1 month of Januvia - then STOP Januvia and SWITCH back to Bydureon 2mg  weekly injection - may have 1 last refill at pharmacy or call us or them to get it sent to restart this. - Bydureon will help with weight loss and help reduce risk of heart problem  We may have samples of Bydureon in future for patients in donut hole - temporary supply  DISCONTINUE LANTUS INSULIN - do not need insulin anymore - this can make you gain weight  For COPD / Asthma - and allergies - keep current treatment  I think you are on the right treatment with added Mucinex, continue Cetirizine, Singulair, and inhalers  Continue Flonase as well  If worsening breathing or wheezing or coughing over next 48 to 72 hours, call office back and request either step down prednisone and or antibiotic if you need it.  Please schedule a Follow-up Appointment to: Return in about 4 months (around 01/19/2018) for DM A1c, med adjust (back on GLP and off Januvia), Weight check.  If you have any other questions or concerns, please feel free to call the office or send a message through [place]. You may also schedule an earlier appointment if necessary.  Additionally, you may be receiving a survey about your experience at our office within a few days to 1 week by e-mail or mail. We value your feedback.  [name], DO [place]

## 2017-09-18 NOTE — Progress Notes (Signed)
Subjective:    Patient ID: Philip Husbands., male    DOB: 26-Jul-1945, 72 y.o.   MRN: [id number]  Philip TIJERINA Sr. is a 72 y.o. male presenting on 09/18/2017 for Diabetes (lowest 96 and highest 240)   HPI   CHRONIC DM, Type 2: - Last visit with me for Diabetes 05/2017, treated with new start GLP1 Bydureon, see prior notes for background information. - Interval update with About 3 months ago he stopped using Bydureon 2mg  weekly and also stopped Lantus 10u nightly PM sugar was highest, and AM was lowest sugar after overhaul lifestyle diet and exercise - Today patient reports he plans to not use injectable medicines or reduce current doses A1c much improved to 7.2 from prior 8.7 CBGs:Avg 100-120 -  No hypoglycemia - checks CBG 2-3x daily Meds: Metformin 1000mg  twice daily (500mg  tabs), Januvia 100mg  daily - He STOPPED Bydureon 2mg  weekly and Lantus10u nightly - Still taking Prednisone daily from Pulmonology Reports good compliance other than insulin. Tolerating well w/o side-effects Currently on ARB, on Statin Lifestyle: - Diet: significantly improved DM diet - Exercise: Walking at work, no other changes - Complicated by peripheral neuropathy and CKD-III - Due for DM Eye Exam - [facility], he will schedule - bilateral cataracts, need copy of report, he states no DM retinopathy Denies hypoglycemia  COPD / Asthma / Allergies History of chronic allergies and chronic COPD, followed by [facility] Pulmonology Dr Raul Del, he continues on therapy with Symbicort, Singulair, Cetirizine, Flonase, he has albuterol PRN - Recent flare up with allergies and other triggers recently. Admits mild flare up. He is on chronic prednisone as well, and if flare he may take burst temporarily. No antibiotics recently - He does not feel that it is severe   Depression screen [facility] 2/9 09/18/2017 07/31/2017 05/14/2017  Decreased Interest 0 0 0  Down, Depressed, Hopeless 0 0 0  PHQ - 2 Score 0 0 0  Altered sleeping 0 0  -  Tired, decreased energy 0 0 -  Change in appetite 0 0 -  Feeling bad or failure about yourself  0 0 -  Trouble concentrating 0 0 -  Moving slowly or fidgety/restless 0 0 -  Suicidal thoughts 0 0 -  PHQ-9 Score 0 0 -  Difficult doing work/chores Not difficult at all Not difficult at all -    Social History   Tobacco Use  . Smoking status: Former Smoker    Packs/day: 1.00    Years: 8.00    Pack years: 8.00    Types: Cigarettes    Last attempt to quit: 12/31/1967    Years since quitting: 49.7  . Smokeless tobacco: Never Used  Substance Use Topics  . Alcohol use: No    Alcohol/week: 0.0 oz    Comment: 05/06/2012 "last alcohol several years ago; never had problem wit"  . Drug use: No    Review of Systems Per HPI unless specifically indicated above     Objective:    BP 117/62   Pulse (!) 58   Temp 98.8 F (37.1 C) (Oral)   Resp 16   Ht 5\' 11"  (1.803 m)   Wt 222 lb 9.6 oz (101 kg)   BMI 31.05 kg/m   Wt Readings from Last 3 Encounters:  09/18/17 222 lb 9.6 oz (101 kg)  07/31/17 216 lb (98 kg)  07/06/17 227 lb (103 kg)    Physical Exam  Constitutional: He is oriented to person, place, and time. He appears  well-developed and well-nourished. No distress.  Well-appearing, comfortable, cooperative, obese  HENT:  Head: Normocephalic and atraumatic.  Mouth/Throat: Oropharynx is clear and moist.  Eyes: Conjunctivae are normal. Right eye exhibits no discharge. Left eye exhibits no discharge.  Neck: Normal range of motion. Neck supple. No thyromegaly present.  Cardiovascular: Normal rate, regular rhythm, normal heart sounds and intact distal pulses.  No murmur heard. Pulmonary/Chest: Effort normal. No respiratory distress. He has wheezes (scattered coarse exp wheezes). He has no rales.  Mild reduced air movement, occasional cough  Musculoskeletal: Normal range of motion. He exhibits no edema.  Lymphadenopathy:    He has no cervical adenopathy.  Neurological: He is alert  and oriented to person, place, and time.  Skin: Skin is warm and dry. No rash noted. He is not diaphoretic. No erythema.  Psychiatric: He has a normal mood and affect. His behavior is normal.  Well groomed, good eye contact, normal speech and thoughts  Nursing note and vitals reviewed.    Diabetic Foot Exam - Simple   Simple Foot Form Diabetic Foot exam was performed with the following findings:  Yes 09/18/2017  4:32 PM  Visual Inspection No deformities, no ulcerations, no other skin breakdown bilaterally:  Yes Sensation Testing Intact to touch and monofilament testing bilaterally:  Yes Pulse Check Posterior Tibialis and Dorsalis pulse intact bilaterally:  Yes Comments     Recent Labs    02/28/17 1417 05/07/17 0808 09/18/17 1617  HGBA1C 7.6* 8.7* 7.2*    Results for orders placed or performed in visit on 09/18/17  POCT HgB A1C  Result Value Ref Range   Hemoglobin A1C 7.2 (A) 4.0 - 5.6 %   HbA1c, POC (prediabetic range)  5.7 - 6.4 %   HbA1c, POC (controlled diabetic range)  0.0 - 7.0 %      Assessment & Plan:   Problem List Items Addressed This Visit    COPD with asthma ([place])    Currently with possible mild early acute COPD flare Without hypoxia, resp status is still stable good air movement Continue current COPD maintenance inhalers, and allergy treatment Continue chronic pred Defer CXR today If clinical worsening may notify office and we can consider add prednisone taper/burst and or antibiotics, or he may f/u with Pulm      Type 2 diabetes mellitus with peripheral neuropathy ([place]) - Primary    Dramatically improved DM control with A1c down to 7.2 from prior 8.7 with lifestyle overhaul diet and exercise, some weight loss - He has self discontinued both GLP1 bydureon and lantus insulin Complications - CKD-III, peripheral neuropathy, cataracts  Plan:  1. Discussion on medication adjustment - advised that recommendation is to [facility] DPP4 Januvia to GLP1 Bydureon for  better A1c, weight loss and ASCVD risk reduction - He has 1 month left of Januvia, after that then will request new rx Bydureon if need it - Goal to switch to Bydureon 2mg  weekly - Discontinue Janvuvia 100mg  - Remain off Lantus insulin - Continue Metformin 1000mg  BID (500mg  x 2 BID) 2. Encourage improved lifestyle - low carb, low sugar diet, reduce portion size, continue improving regular exercise 3. Check CBG, bring log to next visit for review 4. Continue ARB, Statin 5. Advised to schedule DM ophtho exam, send record -  eye 6. Follow-up 4 months DM A1c      Relevant Orders   POCT HgB A1C (Completed)      No orders of the defined types were placed in this encounter.  Follow up plan: Return in about 4 months (around 01/19/2018) for DM A1c, med adjust (back on GLP and off Januvia), Weight check.  [name], [facility] 09/19/2017, 12:06 AM

## 2017-09-19 NOTE — Assessment & Plan Note (Signed)
Currently with possible mild early acute COPD flare Without hypoxia, resp status is still stable good air movement Continue current COPD maintenance inhalers, and allergy treatment Continue chronic pred Defer CXR today If clinical worsening may notify office and we can consider add prednisone taper/burst and or antibiotics, or he may f/u with Pulm

## 2017-09-19 NOTE — Assessment & Plan Note (Signed)
Dramatically improved DM control with A1c down to 7.2 from prior 8.7 with lifestyle overhaul diet and exercise, some weight loss - He has self discontinued both GLP1 bydureon and lantus insulin Complications - CKD-III, peripheral neuropathy, cataracts  Plan:  1. Discussion on medication adjustment - advised that recommendation is to [facility] DPP4 Januvia to GLP1 Bydureon for better A1c, weight loss and ASCVD risk reduction - He has 1 month left of Januvia, after that then will request new rx Bydureon if need it - Goal to switch to Bydureon 2mg  weekly - Discontinue Janvuvia 100mg  - Remain off Lantus insulin - Continue Metformin 1000mg  BID (500mg  x 2 BID) 2. Encourage improved lifestyle - low carb, low sugar diet, reduce portion size, continue improving regular exercise 3. Check CBG, bring log to next visit for review 4. Continue ARB, Statin 5. Advised to schedule DM ophtho exam, send record - [facility] 6. Follow-up 4 months DM A1c

## 2017-09-27 ENCOUNTER — Other Ambulatory Visit: Payer: Self-pay

## 2017-09-27 DIAGNOSIS — E1142 Type 2 diabetes mellitus with diabetic polyneuropathy: Secondary | ICD-10-CM

## 2017-09-27 MED ORDER — GLUCOSE BLOOD VI STRP: 1.0000 | ORAL_STRIP | Freq: Two times a day (BID) | 12 refills | Status: DC

## 2017-10-03 ENCOUNTER — Other Ambulatory Visit: Payer: Self-pay

## 2017-10-03 ENCOUNTER — Encounter
Admission: RE | Disposition: A | Discharge status: Home / Self Care | Payer: Self-pay | Source: Ambulatory Visit | Attending: Ophthalmology

## 2017-10-03 ENCOUNTER — Ambulatory Visit
Admission: RE | Admit: 2017-10-03 | Discharge: 2017-10-03 | Disposition: A | Discharge status: Home / Self Care | Payer: Medicare Other | Source: Ambulatory Visit | Attending: Ophthalmology | Admitting: Ophthalmology

## 2017-10-03 ENCOUNTER — Ambulatory Visit: Payer: Medicare Other | Admitting: Anesthesiology

## 2017-10-03 ENCOUNTER — Encounter: Payer: Self-pay | Admitting: *Deleted

## 2017-10-03 DIAGNOSIS — M1990 Unspecified osteoarthritis, unspecified site: Secondary | ICD-10-CM | POA: Insufficient documentation

## 2017-10-03 DIAGNOSIS — Z87891 Personal history of nicotine dependence: Secondary | ICD-10-CM | POA: Insufficient documentation

## 2017-10-03 DIAGNOSIS — E1122 Type 2 diabetes mellitus with diabetic chronic kidney disease: Secondary | ICD-10-CM | POA: Diagnosis not present

## 2017-10-03 DIAGNOSIS — I2510 Atherosclerotic heart disease of native coronary artery without angina pectoris: Secondary | ICD-10-CM | POA: Insufficient documentation

## 2017-10-03 DIAGNOSIS — J449 Chronic obstructive pulmonary disease, unspecified: Secondary | ICD-10-CM | POA: Insufficient documentation

## 2017-10-03 DIAGNOSIS — H2512 Age-related nuclear cataract, left eye: Secondary | ICD-10-CM | POA: Insufficient documentation

## 2017-10-03 DIAGNOSIS — I129 Hypertensive chronic kidney disease with stage 1 through stage 4 chronic kidney disease, or unspecified chronic kidney disease: Secondary | ICD-10-CM | POA: Diagnosis not present

## 2017-10-03 DIAGNOSIS — I10 Essential (primary) hypertension: Secondary | ICD-10-CM | POA: Insufficient documentation

## 2017-10-03 DIAGNOSIS — I4891 Unspecified atrial fibrillation: Secondary | ICD-10-CM | POA: Diagnosis not present

## 2017-10-03 HISTORY — PX: CATARACT EXTRACTION W/PHACO: SHX586

## 2017-10-03 HISTORY — DX: Unspecified hearing loss, unspecified ear: H91.90

## 2017-10-03 HISTORY — DX: Polyneuropathy, unspecified: G62.9

## 2017-10-03 LAB — GLUCOSE, CAPILLARY: GLUCOSE-CAPILLARY: 174 mg/dL — AB (ref 65–99)

## 2017-10-03 SURGERY — PHACOEMULSIFICATION, CATARACT, WITH IOL INSERTION
Anesthesia: Monitor Anesthesia Care | Site: Eye | Laterality: Left | Wound class: "Clean "

## 2017-10-03 MED ORDER — NA CHONDROIT SULF-NA HYALURON 40-17 MG/ML IO SOLN
INTRAOCULAR | Status: AC
  Filled 2017-10-03: qty 1

## 2017-10-03 MED ORDER — ONDANSETRON HCL 4 MG/2ML IJ SOLN: 4.0000 mg | Freq: Once | INTRAMUSCULAR | Status: DC | PRN

## 2017-10-03 MED ORDER — MOXIFLOXACIN HCL 0.5 % OP SOLN
OPHTHALMIC | Status: DC | PRN
  Administered 2017-10-03: 0.2 mL via OPHTHALMIC

## 2017-10-03 MED ORDER — GLYCOPYRROLATE 0.2 MG/ML IJ SOLN
INTRAMUSCULAR | Status: DC | PRN
  Administered 2017-10-03: 0.2 mg via INTRAVENOUS

## 2017-10-03 MED ORDER — POVIDONE-IODINE 5 % OP SOLN
OPHTHALMIC | Status: AC
Start: 2017-10-03 — End: ?
  Filled 2017-10-03: qty 30

## 2017-10-03 MED ORDER — MOXIFLOXACIN HCL 0.5 % OP SOLN: 1.0000 [drp] | OPHTHALMIC | Status: DC | PRN

## 2017-10-03 MED ORDER — BSS IO SOLN
INTRAOCULAR | Status: DC | PRN
  Administered 2017-10-03: 4 mL via OPHTHALMIC

## 2017-10-03 MED ORDER — FENTANYL CITRATE (PF) 100 MCG/2ML IJ SOLN: 25.0000 ug | INTRAMUSCULAR | Status: DC | PRN

## 2017-10-03 MED ORDER — EPINEPHRINE PF 1 MG/ML IJ SOLN
INTRAOCULAR | Status: DC | PRN
  Administered 2017-10-03: 200 mL via OPHTHALMIC

## 2017-10-03 MED ORDER — MOXIFLOXACIN HCL 0.5 % OP SOLN
OPHTHALMIC | Status: AC
  Filled 2017-10-03: qty 3

## 2017-10-03 MED ORDER — MIDAZOLAM HCL 2 MG/2ML IJ SOLN
INTRAMUSCULAR | Status: DC | PRN
  Administered 2017-10-03: 2 mg via INTRAVENOUS

## 2017-10-03 MED ORDER — EPINEPHRINE PF 1 MG/ML IJ SOLN
INTRAMUSCULAR | Status: AC
  Filled 2017-10-03: qty 1

## 2017-10-03 MED ORDER — NA CHONDROIT SULF-NA HYALURON 40-17 MG/ML IO SOLN
INTRAOCULAR | Status: DC | PRN
  Administered 2017-10-03: 1 mL via INTRAOCULAR

## 2017-10-03 MED ORDER — ARMC OPHTHALMIC DILATING DROPS
1.0000 "application " | OPHTHALMIC | Status: AC
  Administered 2017-10-03 (×3): 1 via OPHTHALMIC

## 2017-10-03 MED ORDER — MIDAZOLAM HCL 2 MG/2ML IJ SOLN
INTRAMUSCULAR | Status: AC
  Filled 2017-10-03: qty 2

## 2017-10-03 MED ORDER — ARMC OPHTHALMIC DILATING DROPS
OPHTHALMIC | Status: AC
  Filled 2017-10-03: qty 0.4

## 2017-10-03 MED ORDER — SODIUM CHLORIDE 0.9 % IV SOLN
INTRAVENOUS | Status: DC
  Administered 2017-10-03: 06:00:00 via INTRAVENOUS

## 2017-10-03 MED ORDER — CARBACHOL 0.01 % IO SOLN
INTRAOCULAR | Status: DC | PRN
  Administered 2017-10-03: 0.5 mL via INTRAOCULAR

## 2017-10-03 MED ORDER — POVIDONE-IODINE 5 % OP SOLN
OPHTHALMIC | Status: DC | PRN
  Administered 2017-10-03: 1 via OPHTHALMIC

## 2017-10-03 MED ORDER — LIDOCAINE HCL (PF) 4 % IJ SOLN
INTRAMUSCULAR | Status: AC
Start: 2017-10-03 — End: ?
  Filled 2017-10-03: qty 5

## 2017-10-03 SURGICAL SUPPLY — 16 items
GLOVE BIO SURGEON STRL SZ8 (GLOVE) ×2 IMPLANT
GLOVE BIOGEL M 6.5 STRL (GLOVE) ×2 IMPLANT
GLOVE SURG LX 8.0 MICRO (GLOVE) ×1
GLOVE SURG LX STRL 8.0 MICRO (GLOVE) ×1 IMPLANT
GOWN STRL REUS W/ TWL LRG LVL3 (GOWN DISPOSABLE) ×2 IMPLANT
GOWN STRL REUS W/TWL LRG LVL3 (GOWN DISPOSABLE) ×2
LABEL CATARACT MEDS ST (LABEL) ×2 IMPLANT
LENS IOL TECNIS ITEC 22.5 (Intraocular Lens) ×1 IMPLANT
PACK CATARACT (MISCELLANEOUS) ×2 IMPLANT
PACK CATARACT BRASINGTON LX (MISCELLANEOUS) ×2 IMPLANT
PACK EYE AFTER SURG (MISCELLANEOUS) ×2 IMPLANT
SOL BSS BAG (MISCELLANEOUS) ×2
SOLUTION BSS BAG (MISCELLANEOUS) ×1 IMPLANT
SYR 5ML LL (SYRINGE) ×2 IMPLANT
WATER STERILE IRR 250ML POUR (IV SOLUTION) ×2 IMPLANT
WIPE NON LINTING 3.25X3.25 (MISCELLANEOUS) ×2 IMPLANT

## 2017-10-03 NOTE — Transfer of Care (Signed)
Immediate Anesthesia Transfer of Care Note  Patient: Philip LEGAN Sr.  Procedure(s) Performed: CATARACT EXTRACTION PHACO AND INTRAOCULAR LENS PLACEMENT (IOC) (Left Eye)  Patient Location: PACU  Anesthesia Type:MAC  Level of Consciousness: awake, alert  and oriented  Airway & Oxygen Therapy: Patient Spontanous Breathing and Patient connected to nasal cannula oxygen  Post-op Assessment: Report given to RN and Post -op Vital signs reviewed and stable  Post vital signs: Reviewed and stable  Last Vitals:  Vitals Value Taken Time  BP    Temp    Pulse    Resp    SpO2      Last Pain:  Vitals:   10/03/17 0608  TempSrc: Oral  PainSc: 0-No pain         Complications: No apparent anesthesia complications

## 2017-10-03 NOTE — Anesthesia Procedure Notes (Signed)
Procedure Name: MAC Date/Time: 10/03/2017 7:26 AM Performed by: Nelda Marseille, CRNA Pre-anesthesia Checklist: Patient identified, Emergency Drugs available, Suction available, Patient being monitored and Timeout performed Oxygen Delivery Method: Nasal cannula

## 2017-10-03 NOTE — H&P (Signed)
All labs reviewed. Abnormal studies sent to patients PCP when indicated.  Previous H&P reviewed, patient examined, there are NO CHANGES.  Philip Blatchford Porfilio6/5/20197:15 AM

## 2017-10-03 NOTE — Discharge Instructions (Addendum)
FOLLOW DR. PORFILIO'S POSTOP EYE DROP INSTRUCTION SHEET AS REVIEWED.  Eye Surgery Discharge Instructions  Expect mild scratchy sensation or mild soreness. DO NOT RUB YOUR EYE!  The day of surgery:  Minimal physical activity, but bed rest is not required  No reading, computer work, or close hand work  No bending, lifting, or straining.  May watch TV  For 24 hours:  No driving, legal decisions, or alcoholic beverages  Safety precautions  Eat anything you prefer: It is better to start with liquids, then soup then solid foods.  Solar shield eyeglasses should be worn for comfort in the sunlight/patch while sleeping  Resume all regular medications including aspirin or Coumadin if these were discontinued prior to surgery. You may shower, bathe, shave, or wash your hair. Tylenol may be taken for mild discomfort.  Call your doctor if you experience significant pain, nausea, or vomiting, fever > 101 or other signs of infection. [phone] or [phone] Specific instructions:  Follow-up Information    Birder Robson, MD Follow up.   Specialty:  Ophthalmology Why:  10/04/17 @ 11:00 am Contact information: [address] [place] [address] [phone]

## 2017-10-03 NOTE — Anesthesia Post-op Follow-up Note (Signed)
Anesthesia QCDR form completed.        

## 2017-10-03 NOTE — Anesthesia Postprocedure Evaluation (Signed)
Anesthesia Post Note  Patient: Philip [name].  Procedure(s) Performed: CATARACT EXTRACTION PHACO AND INTRAOCULAR LENS PLACEMENT (IOC) (Left Eye)  Patient location during evaluation: PACU Anesthesia Type: MAC Level of consciousness: awake, awake and alert and oriented Pain management: pain level controlled Vital Signs Assessment: post-procedure vital signs reviewed and stable Respiratory status: spontaneous breathing, nonlabored ventilation and respiratory function stable Cardiovascular status: blood pressure returned to baseline and stable Anesthetic complications: no     Last Vitals:  Vitals:   10/03/17 0610 10/03/17 0743  BP:  128/67  Pulse:  71  Resp:  18  Temp: 36.8 C (!) 36.3 C  SpO2:  100%    Last Pain:  Vitals:   10/03/17 0743  TempSrc: Temporal  PainSc: 0-No pain                 Hitomi Slape,  [place] Fickling R

## 2017-10-03 NOTE — Anesthesia Preprocedure Evaluation (Addendum)
Anesthesia Evaluation  Patient identified by MRN, date of birth, ID band Patient awake    Reviewed: Allergy & Precautions, H&P , NPO status , reviewed documented beta blocker date and time   Airway Mallampati: II  TM Distance: >3 FB Neck ROM: full    Dental  (+) Teeth Intact, Chipped   Pulmonary shortness of breath, asthma , sleep apnea , COPD, former smoker,    Pulmonary exam normal        Cardiovascular hypertension, + CAD  Normal cardiovascular exam+ dysrhythmias      Neuro/Psych  Neuromuscular disease    GI/Hepatic   Endo/Other  diabetesHypothyroidism   Renal/GU Renal disease     Musculoskeletal  (+) Arthritis ,   Abdominal   Peds  Hematology   Anesthesia Other Findings Past Medical History: No date: Arthritis     Comment:  a. right knee No date: Asthma No date: Atrial arrhythmia     Comment:  salvos of nonsustained atach No date: Chronic kidney disease No date: Collapse of right lung     Comment:  a. 12/1967 - ? etiology. No date: COPD (chronic obstructive pulmonary disease) (HCC) No date: Coronary artery disease     Comment:  a. 05/2012 Cath: severe 3VD-->CABG by Dr. Servando Snare in               05/2012 with LIMA to LAD, SVG to LCX & SVG to RPDA. No date: Cough     Comment:  CHRONIC No date: Dyspnea No date: Dysrhythmia     Comment:  AFIB No date: Edema     Comment:  LEGS/ FEET No date: History of bronchitis No date: HOH (hard of hearing) No date: Hypercholesteremia No date: Hypertension No date: Hypothyroidism No date: Neuropathy No date: Shingles No date: Sleep apnea     Comment:  NO CPAP No date: Thyroid disease No date: Type II diabetes mellitus ([place]) No date: Wheezing No date: Wheezing  Past Surgical History: ~ 2009: ANTERIOR CERVICAL DECOMP/DISCECTOMY FUSION No date: BACK SURGERY     Comment:  CERVICAL FUSION 05/06/2012: CARDIAC CATHETERIZATION     Comment:  Significant three-vessel  coronary artery disease with               normal ejection fraction 08/15/2017: CATARACT EXTRACTION W/PHACO; Right     Comment:  Procedure: CATARACT EXTRACTION PHACO AND INTRAOCULAR               LENS PLACEMENT ([place]);  Surgeon: Birder Robson, MD;                Location: [facility] OR[facility];  Service: Ophthalmology;  Laterality:              Right;  [place] 00:23 AP% 13.3 CDE 3.11 Fluid pack lot #               [id number] No date: CORONARY ANGIOPLASTY 05/07/2012: CORONARY ARTERY BYPASS GRAFT     Comment:  Procedure: CORONARY ARTERY BYPASS GRAFTING (CABG);                Surgeon: [name], MD;  Location: [place];                Service: Open Heart Surgery;  Laterality: N/A;  times               three 05/07/2012: INTRAOPERATIVE TRANSESOPHAGEAL ECHOCARDIOGRAM     Comment:  Procedure: INTRAOPERATIVE TRANSESOPHAGEAL  ECHOCARDIOGRAM;  Surgeon: [name], MD;                Location: [place];  Service: Open Heart Surgery;                Laterality: N/A; ~ 2000: KNEE ARTHROSCOPY     Comment:  "right" (05/06/2012) 2003: KNEE SURGERY     Comment:  right 2008: NECK SURGERY     Comment:  Plate in neck  BMI    Body Mass Index:  30.96 kg/m      Reproductive/Obstetrics                            Anesthesia Physical Anesthesia Plan  ASA: III  Anesthesia Plan: MAC   Post-op Pain Management:    Induction:   PONV Risk Score and Plan: Treatment may vary due to age or medical condition and TIVA  Airway Management Planned:   Additional Equipment:   Intra-op Plan:   Post-operative Plan:   Informed Consent: I have reviewed the patients History and Physical, chart, labs and discussed the procedure including the risks, benefits and alternatives for the proposed anesthesia with the patient or authorized representative who has indicated his/her understanding and acceptance.   Dental Advisory Given  Plan Discussed with: CRNA  Anesthesia Plan Comments:          Anesthesia Quick Evaluation

## 2017-10-03 NOTE — Op Note (Signed)
PREOPERATIVE DIAGNOSIS:  Nuclear sclerotic cataract of the left eye.   POSTOPERATIVE DIAGNOSIS:  Nuclear sclerotic cataract of the left eye.   OPERATIVE PROCEDURE: Procedure(s): CATARACT EXTRACTION PHACO AND INTRAOCULAR LENS PLACEMENT (IOC)   SURGEON:  Birder Robson, MD.   ANESTHESIA:  Anesthesiologist: Alphonsus Sias, MD CRNA: Nelda Marseille, CRNA  1.      Managed anesthesia care. 2.     0.26ml of Shugarcaine was instilled following the paracentesis   COMPLICATIONS:  None.   TECHNIQUE:   Stop and chop   DESCRIPTION OF PROCEDURE:  The patient was examined and consented in the preoperative holding area where the aforementioned topical anesthesia was applied to the left eye and then brought back to the Operating Room where the left eye was prepped and draped in the usual sterile ophthalmic fashion and a lid speculum was placed. A paracentesis was created with the side port blade and the anterior chamber was filled with viscoelastic. A near clear corneal incision was performed with the steel keratome. A continuous curvilinear capsulorrhexis was performed with a cystotome followed by the capsulorrhexis forceps. Hydrodissection and hydrodelineation were carried out with BSS on a blunt cannula. The lens was removed in a stop and chop  technique and the remaining cortical material was removed with the irrigation-aspiration handpiece. The capsular bag was inflated with viscoelastic and the Technis [id number] lens was placed in the capsular bag without complication. The remaining viscoelastic was removed from the eye with the irrigation-aspiration handpiece. The wounds were hydrated. The anterior chamber was flushed with Miostat and the eye was inflated to physiologic pressure. 0.40ml Vigamox was placed in the anterior chamber. The wounds were found to be water tight. The eye was dressed with Vigamox. The patient was given protective glasses to wear throughout the day and a shield with which to sleep tonight.  The patient was also given drops with which to begin a drop regimen today and will follow-up with me in one day. Implant Name Type Inv. Item Serial No. Manufacturer Lot No. LRB No. Used  LENS IOL DIOP 22.5 - [id number] Intraocular Lens LENS IOL DIOP 22.5 [id number] AMO  Left 1    Procedure(s) with comments: CATARACT EXTRACTION PHACO AND INTRAOCULAR LENS PLACEMENT (IOC) (Left) - Lot #[id number] H [place]: 00:21.2 AP%:13.9 CDE: 2.96  Electronically signed: Birder Robson 10/03/2017 7:42 AM

## 2017-10-09 ENCOUNTER — Ambulatory Visit: Payer: Medicare Other | Admitting: Cardiovascular Disease

## 2017-10-09 ENCOUNTER — Encounter: Payer: Self-pay | Admitting: Cardiovascular Disease

## 2017-10-09 VITALS — BP 90/60 | HR 62 | Ht 71.0 in | Wt 219.8 lb

## 2017-10-09 DIAGNOSIS — I10 Essential (primary) hypertension: Secondary | ICD-10-CM | POA: Diagnosis not present

## 2017-10-09 DIAGNOSIS — I480 Paroxysmal atrial fibrillation: Secondary | ICD-10-CM

## 2017-10-09 DIAGNOSIS — E785 Hyperlipidemia, unspecified: Secondary | ICD-10-CM

## 2017-10-09 DIAGNOSIS — I2510 Atherosclerotic heart disease of native coronary artery without angina pectoris: Secondary | ICD-10-CM

## 2017-10-09 DIAGNOSIS — I471 Supraventricular tachycardia: Secondary | ICD-10-CM

## 2017-10-09 MED ORDER — LOSARTAN POTASSIUM 25 MG PO TABS: 25.0000 mg | ORAL_TABLET | Freq: Every day | ORAL | 3 refills | Status: DC

## 2017-10-09 MED ORDER — ATORVASTATIN CALCIUM 40 MG PO TABS: 40.0000 mg | ORAL_TABLET | Freq: Every day | ORAL | 3 refills | Status: DC

## 2017-10-09 NOTE — Patient Instructions (Signed)
Medication Instructions:  Your physician has recommended you make the following change in your medication:  1. INCREASE Atorvastatin 40 mg once daily 2. DECREASE Losartan to 25 mg once daily   Follow-Up: Your physician wants you to follow-up in: 6 months with Dr. Fletcher Anon. You will receive a reminder letter in the mail two months in advance. If you don't receive a letter, please call our office to schedule the follow-up appointment.   It was a pleasure seeing you today here in the office. Please do not hesitate to give [place] a call back if you have any further questions. [place], BSN

## 2017-10-09 NOTE — Progress Notes (Signed)
Cardiology Office Note   Date:  10/09/2017   ID:  Philip Potters Sr., DOB 08/14/1945, MRN [id number]  PCP:  [name], DO  Cardiologist:   [name], MD   Chief Complaint  Patient presents with  . other    3 month follow up. Meds reviewed by the pt. verbally. Pt. c/o shortness of breath and chest tightness.       History of Present Illness: Philip Ticas. is a 72 y.o. male who presents for a follow-up visit regarding coronary artery disease, paroxysmal atrial fibrillation and atrial tachycardia.  He is status post CABG in January of 2014.  He had postoperative atrial fibrillation which was treated with amiodarone.    He is known to have frequent PACs and atrial tachycardia with pauses.  In August 2016 he had suspected atrial fibrillation on his EKG and thus he was started on Eliquis. Most recent echocardiogram in March 2017 showed low normal LV systolic function with an EF of 50-55%, mildly dilated left atrium and no evidence of pulmonary hypertension. He also has COPD followed by Dr. Raul Del.  The patient is known to have paroxysmal atrial tachycardia and probably short runs of atrial fibrillation.  He was seen by Dr. Rayann Heman last year to consider ablation but given minimal symptoms, medical therapy was recommended.  Amiodarone was discontinued.    During last visit, he was noted to be in atrial tachycardia with a heart rate of 115 bpm.  I resumed metoprolol 25 mg twice daily and decrease losartan to 50 mg once daily.  He has not had any palpitations.  He has chronic stable dyspnea with occasional left-sided chest pain at rest which has been chronic and overall atypical.  Past Medical History:  Diagnosis Date  . Arthritis    a. right knee  . Asthma   . Atrial arrhythmia    salvos of nonsustained atach  . Chronic kidney disease   . 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 etiology.  [name] COPD (chronic obstructive pulmonary disease) ([place])   . Coronary  artery disease    a. 05/2012 Cath: severe 3VD-->CABG by Dr. Servando Snare in 05/2012 with LIMA to LAD, SVG to LCX & SVG to RPDA.  [name] Cough    CHRONIC  . Dyspnea   . Dysrhythmia    AFIB  . Edema    LEGS/ FEET  . History of bronchitis   . HOH (hard of hearing)   . Hypercholesteremia   . Hypertension   . Hypothyroidism   . Neuropathy   . Shingles   . Sleep apnea    NO CPAP  . Thyroid disease   . Type II diabetes mellitus ([place])   . Wheezing   . Wheezing     Past Surgical History:  Procedure Laterality Date  . ANTERIOR CERVICAL DECOMP/DISCECTOMY FUSION  ~ 2009  . BACK SURGERY     CERVICAL FUSION  . CARDIAC CATHETERIZATION  05/06/2012   Significant three-vessel coronary artery disease with normal ejection fraction  . CATARACT EXTRACTION W/PHACO Right 08/15/2017   Procedure: CATARACT EXTRACTION PHACO AND INTRAOCULAR LENS PLACEMENT (IOC);  Surgeon: Birder Robson, MD;  Location: [facility];  Service: Ophthalmology;  Laterality: Right;  [place] 00:23 AP% 13.3 CDE 3.11 Fluid pack lot # [id number]  . CATARACT EXTRACTION W/PHACO Left 10/03/2017   Procedure: CATARACT EXTRACTION PHACO AND INTRAOCULAR LENS PLACEMENT (IOC);  Surgeon: Birder Robson, MD;  Location: [facility];  Service: Ophthalmology;  Laterality:  Left;  Lot #[id number] H [place]: 00:21.2 AP%:13.9 CDE: 2.96  . CORONARY ANGIOPLASTY    . CORONARY ARTERY BYPASS GRAFT  05/07/2012   Procedure: CORONARY ARTERY BYPASS GRAFTING (CABG);  Surgeon: [name], MD;  Location: [place];  Service: Open Heart Surgery;  Laterality: N/A;  times three  . INTRAOPERATIVE TRANSESOPHAGEAL ECHOCARDIOGRAM  05/07/2012   Procedure: INTRAOPERATIVE TRANSESOPHAGEAL ECHOCARDIOGRAM;  Surgeon: [name], MD;  Location: [place];  Service: Open Heart Surgery;  Laterality: N/A;  . KNEE ARTHROSCOPY  ~ 2000   "right" (05/06/2012)  . KNEE SURGERY  2003   right  . NECK SURGERY  2008   Plate in neck     Current Outpatient Medications  Medication Sig Dispense Refill  .  albuterol (PROVENTIL) (2.5 MG/3ML) 0.083% nebulizer solution Inhale 3 mLs into the lungs every 6 (six) hours as needed for wheezing or shortness of breath.     . Albuterol Sulfate 108 (90 Base) MCG/ACT AEPB Inhale 2 puffs into the lungs every 6 (six) hours as needed (shortness of breath).     . Artificial Tear Ointment (DRY EYES OP) Place 1 drop into both eyes daily as needed (for dry eyes).    [name] atorvastatin (LIPITOR) 20 MG tablet Take 1 tablet (20 mg total) by mouth at bedtime. 90 tablet 0  . cetirizine (ZYRTEC) 10 MG tablet Take 10 mg by mouth 2 (two) times daily.     . diclofenac sodium (VOLTAREN) 1 % GEL Apply 2 g topically 3 (three) times daily as needed (Use on elbow bursitis and knee). 100 g 2  . ELIQUIS 5 MG TABS tablet TAKE ONE TABLET BY MOUTH TWICE DAILY 60 tablet 6  . furosemide (LASIX) 40 MG tablet TAKE ONE TABLET BY MOUTH ONCE DAILY 90 tablet 2  . gabapentin (NEURONTIN) 100 MG capsule Take 1 capsule (100 mg total) by mouth 3 (three) times daily. 270 capsule 3  . glucose blood (ONE TOUCH ULTRA TEST) test strip 1 each by Other route 2 (two) times daily. Use as instructed to check bloods sugar twice a day. 100 each 12  . glucose monitoring kit (FREESTYLE) monitoring kit 1 each by Does not apply route as needed for other. 1 each 0  . LANCETS ULTRA THIN MISC 1 Device by Does not apply route 4 (four) times daily -  before meals and at bedtime. 200 each 5  . levothyroxine (SYNTHROID, LEVOTHROID) 88 MCG tablet Take 1 tablet (88 mcg total) by mouth daily. 30 tablet 6  . losartan (COZAAR) 50 MG tablet Take 1 tablet (50 mg total) by mouth daily. 90 tablet 3  . metFORMIN (GLUCOPHAGE) 500 MG tablet TAKE TWO TABLETS BY MOUTH TWICE DAILY WITH FOOD (Patient taking differently: Take 1,000 mg by mouth 2 (two) times daily with a meal. ) 360 tablet 3  . metoprolol tartrate (LOPRESSOR) 25 MG tablet Take 1 tablet (25 mg total) by mouth 2 (two) times daily. 60 tablet 5  . montelukast (SINGULAIR) 10 MG tablet  Take 10 mg by mouth at bedtime.     . predniSONE (DELTASONE) 10 MG tablet Take 5-10 mg by mouth See admin instructions. Take 5 mg by mouth every other day alternating with 10 mg by mouth every other day    . sitaGLIPtin (JANUVIA) 100 MG tablet Take 1 tablet (100 mg total) by mouth daily. 30 tablet 6  . SYMBICORT 160-4.5 MCG/ACT inhaler Inhale 2 puffs 2 (two) times daily into the lungs.     [name] BYDUREON BCISE  2 MG/0.85ML AUIJ Inject 2 mg into the skin once a week. (Patient not taking: Reported on 10/03/2017) 4 pen 3  . dicyclomine (BENTYL) 10 MG capsule Take 1 capsule (10 mg total) by mouth 4 (four) times daily -  before meals and at bedtime. (Patient not taking: Reported on 10/03/2017) 30 capsule 0   No current facility-administered medications for this visit.     Allergies:   Clindamycin/lincomycin; Iodine; Penicillins; Sulfa antibiotics; Spiriferis; and Ivp dye [iodinated diagnostic agents]    Social History:  The patient  reports that he quit smoking about 49 years ago. His smoking use included cigarettes. He has a 8.00 pack-year smoking history. He has never used smokeless tobacco. He reports that he does not drink alcohol or use drugs.   Family History:  The patient's family history includes Clotting disorder in his mother; Heart disease in his mother; Hypertension in his sister.    ROS:  Please see the history of present illness.   Otherwise, review of systems are positive for none.   All other systems are reviewed and negative.    PHYSICAL EXAM: VS:  BP 90/60 (BP Location: Left Arm, Patient Position: Sitting, Cuff Size: Normal)   Pulse 62   Ht 5' 11"  (1.803 m)   Wt 219 lb 12 oz (99.7 kg)   BMI 30.65 kg/m  , BMI Body mass index is 30.65 kg/m. GEN: Well nourished, well developed, in no acute distress  HEENT: normal  Neck: no JVD, carotid bruits, or masses Cardiac: Regular rate and rhythm; no murmurs, rubs, or gallops, +1 edema Respiratory: Lungs are clear, normal work of  breathing GI: soft, nontender, nondistended, + BS MS: no deformity or atrophy  Skin: warm and dry, no rash Neuro:  Strength and sensation are intact Psych: euthymic mood, full affect   EKG:  EKG is ordered today. The ekg ordered today demonstrates normal sinus rhythm with nonspecific ST and T wave changes.  Recent Labs: 05/07/2017: ALT 15; TSH 4.69 07/31/2017: BUN 17; Creat 1.27; Hemoglobin 15.9; Platelets 146; Potassium 4.8; Sodium 142    Lipid Panel    Component Value Date/Time   CHOL 158 05/07/2017 0808   CHOL 139 12/14/2014 0834   TRIG 64 05/07/2017 0808   HDL 67 05/07/2017 0808   HDL 43 12/14/2014 0834   CHOLHDL 2.4 05/07/2017 0808   LDLCALC 77 05/07/2017 0808      Wt Readings from Last 3 Encounters:  10/09/17 219 lb 12 oz (99.7 kg)  10/03/17 222 lb (100.7 kg)  09/18/17 222 lb 9.6 oz (101 kg)        ASSESSMENT AND PLAN:   1. Paroxysmal atrial fibrillation: He is currently on anticoagulation with Eliquis.  Labs in April were unremarkable  2.  Paroxysmal atrial tachycardia: This improved with resuming metoprolol 25 mg twice a day.  He is currently in sinus rhythm.  3. Bilateral leg edema: Suspected chronic venous insufficiency. This is stable.  4. Coronary artery disease involving native coronary arteries without angina: He has no anginal symptoms.   5. Essential hypertension: Blood pressure is low.  I decrease losartan to 25 mg daily.  6. Hyperlipidemia: Continue treatment with atorvastatin. Most recent LDL was 77.  We should target an LDL below 70.  I increased atorvastatin to 40 mg daily.   Disposition:   FU with me in 6 months  Signed,  [name], MD  10/09/2017 2:37 PM    [place]

## 2017-10-11 DIAGNOSIS — M25561 Pain in right knee: Secondary | ICD-10-CM | POA: Diagnosis not present

## 2017-10-11 DIAGNOSIS — M1731 Unilateral post-traumatic osteoarthritis, right knee: Secondary | ICD-10-CM | POA: Diagnosis not present

## 2017-10-15 ENCOUNTER — Telehealth: Payer: Self-pay | Admitting: Cardiovascular Disease

## 2017-10-15 NOTE — Telephone Encounter (Signed)
Called patient.  He just saw patient on 10/09/17 and increased atorvastatin to 40 mg once a day. Med list had both atorvastatin 20 mg and 40 mg listed. Advised patient to only take 40 mg once a day and he verbalized understanding. Med list updated.

## 2017-10-15 NOTE — Telephone Encounter (Signed)
Pt calling stating we increased his Atorvastatin from 20 mg to 40 mg  He states on his list it is listed as he's taking both 20 mg and 40 mg. He needs to know in a day how much is he to take   Please advise

## 2017-11-05 DIAGNOSIS — J432 Centrilobular emphysema: Secondary | ICD-10-CM | POA: Diagnosis not present

## 2017-11-05 DIAGNOSIS — G4733 Obstructive sleep apnea (adult) (pediatric): Secondary | ICD-10-CM | POA: Diagnosis not present

## 2017-11-12 ENCOUNTER — Encounter: Payer: Self-pay | Admitting: Family Medicine

## 2017-12-02 ENCOUNTER — Emergency Department
Admission: EM | Admit: 2017-12-02 | Discharge: 2017-12-02 | Disposition: A | Discharge status: Home / Self Care | Payer: Medicare Other | Attending: Emergency Medicine | Admitting: Emergency Medicine

## 2017-12-02 ENCOUNTER — Other Ambulatory Visit: Payer: Self-pay

## 2017-12-02 ENCOUNTER — Encounter: Payer: Self-pay | Admitting: *Deleted

## 2017-12-02 DIAGNOSIS — E1122 Type 2 diabetes mellitus with diabetic chronic kidney disease: Secondary | ICD-10-CM | POA: Diagnosis not present

## 2017-12-02 DIAGNOSIS — L500 Allergic urticaria: Secondary | ICD-10-CM | POA: Insufficient documentation

## 2017-12-02 DIAGNOSIS — Z955 Presence of coronary angioplasty implant and graft: Secondary | ICD-10-CM | POA: Diagnosis not present

## 2017-12-02 DIAGNOSIS — Z7984 Long term (current) use of oral hypoglycemic drugs: Secondary | ICD-10-CM | POA: Diagnosis not present

## 2017-12-02 DIAGNOSIS — T782XXA Anaphylactic shock, unspecified, initial encounter: Secondary | ICD-10-CM | POA: Diagnosis not present

## 2017-12-02 DIAGNOSIS — F17200 Nicotine dependence, unspecified, uncomplicated: Secondary | ICD-10-CM | POA: Insufficient documentation

## 2017-12-02 DIAGNOSIS — Z79899 Other long term (current) drug therapy: Secondary | ICD-10-CM | POA: Diagnosis not present

## 2017-12-02 DIAGNOSIS — N182 Chronic kidney disease, stage 2 (mild): Secondary | ICD-10-CM | POA: Insufficient documentation

## 2017-12-02 DIAGNOSIS — I5030 Unspecified diastolic (congestive) heart failure: Secondary | ICD-10-CM | POA: Insufficient documentation

## 2017-12-02 DIAGNOSIS — I130 Hypertensive heart and chronic kidney disease with heart failure and stage 1 through stage 4 chronic kidney disease, or unspecified chronic kidney disease: Secondary | ICD-10-CM | POA: Diagnosis not present

## 2017-12-02 DIAGNOSIS — Z951 Presence of aortocoronary bypass graft: Secondary | ICD-10-CM | POA: Diagnosis not present

## 2017-12-02 DIAGNOSIS — R0602 Shortness of breath: Secondary | ICD-10-CM | POA: Diagnosis not present

## 2017-12-02 MED ORDER — EPINEPHRINE 0.3 MG/0.3ML IJ SOAJ: 0.3000 mg | Freq: Once | INTRAMUSCULAR | 0 refills | Status: AC

## 2017-12-02 MED ORDER — EPINEPHRINE 0.3 MG/0.3ML IJ SOAJ
INTRAMUSCULAR | Status: AC
  Filled 2017-12-02: qty 0.3

## 2017-12-02 MED ORDER — CETIRIZINE HCL 10 MG PO CAPS: 10.0000 mg | ORAL_CAPSULE | Freq: Four times a day (QID) | ORAL | 0 refills | Status: DC

## 2017-12-02 MED ORDER — METHYLPREDNISOLONE SODIUM SUCC 125 MG IJ SOLR
125.0000 mg | Freq: Once | INTRAMUSCULAR | Status: AC
  Administered 2017-12-02: 125 mg via INTRAVENOUS
  Filled 2017-12-02: qty 2

## 2017-12-02 MED ORDER — FAMOTIDINE IN NACL 20-0.9 MG/50ML-% IV SOLN
20.0000 mg | Freq: Once | INTRAVENOUS | Status: AC
  Administered 2017-12-02: 20 mg via INTRAVENOUS
  Filled 2017-12-02: qty 50

## 2017-12-02 MED ORDER — SODIUM CHLORIDE 0.9 % IV BOLUS
500.0000 mL | Freq: Once | INTRAVENOUS | Status: AC
  Administered 2017-12-02: 500 mL via INTRAVENOUS

## 2017-12-02 MED ORDER — PREDNISONE 50 MG PO TABS: 50.0000 mg | ORAL_TABLET | Freq: Every day | ORAL | 0 refills | Status: AC

## 2017-12-02 MED ORDER — EPINEPHRINE 0.3 MG/0.3ML IJ SOAJ
0.3000 mg | Freq: Once | INTRAMUSCULAR | Status: AC
  Administered 2017-12-02: 0.3 mg via INTRAMUSCULAR

## 2017-12-02 MED ORDER — DIPHENHYDRAMINE HCL 50 MG/ML IJ SOLN
50.0000 mg | Freq: Once | INTRAMUSCULAR | Status: AC
  Administered 2017-12-02: 50 mg via INTRAVENOUS
  Filled 2017-12-02: qty 1

## 2017-12-02 NOTE — Discharge Instructions (Signed)
It was a pleasure to take care of you today, and thank you for coming to our emergency department.  If you have any questions or concerns before leaving please ask the nurse to grab me and I'm more than happy to go through your aftercare instructions again. ° °If you were prescribed any opioid pain medication today such as Norco, Vicodin, Percocet, morphine, hydrocodone, or oxycodone please make sure you do not drive when you are taking this medication as it can alter your ability to drive safely. ° °If you have any concerns once you are home that you are not improving or are in fact getting worse before you can make it to your follow-up appointment, please do not hesitate to call 911 and come back for further evaluation. ° °Alessia Gonsalez, MD ° ° ° °

## 2017-12-02 NOTE — ED Triage Notes (Signed)
Pt presents w/ c/o rash over torso, back, and head, unrelenting itching, swelling to hands, and shortness of breath. Pt states allergic reaction to an diabetic medication, rash began w/i 30 minutes of injection of  Bydureon BCise. Pt took benadryl 25 mg po and albuterol neb prior to arrival w/ some relief of tightness.

## 2017-12-02 NOTE — ED Provider Notes (Signed)
[facility] Emergency Department Provider Note  ____________________________________________   First MD Initiated Contact with Patient 12/02/17 [phone]     (approximate)  I have reviewed the triage vital signs and the nursing notes.   HISTORY  Chief Complaint Allergic Reaction   HPI Philip [name]. is a 72 y.o. male who self presents to the emergency department with rash on his torso back head and severe itching swelling to his hands associated with shortness of breath that began roughly 30 minutes after injecting a relatively new diabetic medication.  This is his fourth lifetime dose.  He did take 25 mg of Benadryl at home with minimal relief.  He also use 1 albuterol treatment with minimal relief.   He has a history of multiple allergies although has never had anaphylaxis.  His symptoms came on suddenly were severe.   Past Medical History:  Diagnosis Date  . Arthritis    a. right knee  . Asthma   . Atrial arrhythmia    salvos of nonsustained atach  . Chronic kidney disease   . 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 etiology.  [name] COPD (chronic obstructive pulmonary disease) ([place])   . Coronary artery disease    a. 05/2012 Cath: severe 3VD-->CABG by Dr. Servando Snare in 05/2012 with LIMA to LAD, SVG to LCX & SVG to RPDA.  [name] Cough    CHRONIC  . Dyspnea   . Dysrhythmia    AFIB  . Edema    LEGS/ FEET  . History of bronchitis   . HOH (hard of hearing)   . Hypercholesteremia   . Hypertension   . Hypothyroidism   . Neuropathy   . Shingles   . Sleep apnea    NO CPAP  . Thyroid disease   . Type II diabetes mellitus ([place])   . Wheezing   . Wheezing     Patient Active Problem List   Diagnosis Date Noted  . Osteoarthritis of multiple joints 05/14/2017  . Chronic pain of left knee 05/14/2017  . OSA on CPAP 02/28/2017  . CKD (chronic kidney disease), stage II 02/28/2017  . Chronic anticoagulation 02/28/2017  . Onychomycosis of left great toe  08/14/2016  . Allergic rhinitis due to allergen 07/31/2016  . Peripheral neuropathy 08/17/2015  . Type 2 diabetes mellitus with peripheral neuropathy ([place]) 08/17/2015  . (HFpEF) heart failure with preserved ejection fraction ([place]) 07/25/2015  . Atrial fibrillation ([place]) 12/10/2014  . COPD with asthma ([place]) 11/09/2014  . Bradycardia 11/09/2014  . 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 Essential (primary) hypertension 11/09/2014  . Bursitis of hip 11/09/2014  . Enlarged prostate 11/09/2014  . Coronary artery disease   . 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 Hypothyroid 05/06/2012  . Hypercholesteremia 03/31/2012  . Colon polyp 12/21/2003    Past Surgical History:  Procedure Laterality Date  . ANTERIOR CERVICAL DECOMP/DISCECTOMY FUSION  ~ 2009  . BACK SURGERY     CERVICAL FUSION  . CARDIAC CATHETERIZATION  05/06/2012   Significant three-vessel coronary artery disease with normal ejection fraction  . CATARACT EXTRACTION W/PHACO Right 08/15/2017   Procedure: CATARACT EXTRACTION PHACO AND INTRAOCULAR LENS PLACEMENT (IOC);  Surgeon: Birder Robson, MD;  Location: [facility];  Service: Ophthalmology;  Laterality: Right;  [place] 00:23 AP% 13.3 CDE 3.11 Fluid pack lot # [id number]  . CATARACT EXTRACTION W/PHACO Left 10/03/2017   Procedure: CATARACT EXTRACTION PHACO AND INTRAOCULAR LENS PLACEMENT (IOC);  Surgeon: Birder Robson, MD;  Location:  [facility];  Service: Ophthalmology;  Laterality: Left;  Lot #[id number] H [place]: 00:21.2 AP%:13.9 CDE: 2.96  . CORONARY ANGIOPLASTY    . CORONARY ARTERY BYPASS GRAFT  05/07/2012   Procedure: CORONARY ARTERY BYPASS GRAFTING (CABG);  Surgeon: [name], MD;  Location: [place];  Service: Open Heart Surgery;  Laterality: N/A;  times three  . INTRAOPERATIVE TRANSESOPHAGEAL ECHOCARDIOGRAM  05/07/2012   Procedure: INTRAOPERATIVE TRANSESOPHAGEAL ECHOCARDIOGRAM;  Surgeon: [name], MD;  Location: [place];  Service: Open Heart Surgery;  Laterality: N/A;  . KNEE  ARTHROSCOPY  ~ 2000   "right" (05/06/2012)  . KNEE SURGERY  2003   right  . NECK SURGERY  2008   Plate in neck    Prior to Admission medications   Medication Sig Start Date End Date Taking? Authorizing Provider  albuterol (PROVENTIL) (2.5 MG/3ML) 0.083% nebulizer solution Inhale 3 mLs into the lungs every 6 (six) hours as needed for wheezing or shortness of breath.  03/13/16   [provider]  Albuterol Sulfate 108 (90 Base) MCG/ACT AEPB Inhale 2 puffs into the lungs every 6 (six) hours as needed (shortness of breath).     [provider]  Artificial Tear Ointment (DRY EYES OP) Place 1 drop into both eyes daily as needed (for dry eyes).    [provider]  atorvastatin (LIPITOR) 40 MG tablet Take 1 tablet (40 mg total) by mouth daily. 10/09/17 01/07/18  [name], MD  BYDUREON BCISE 2 MG/0.85ML AUIJ Inject 2 mg into the skin once a week. 05/16/17   [name], [name], DO  Cetirizine HCl 10 MG CAPS Take 1 capsule (10 mg total) by mouth 4 (four) times daily for 5 days. 12/02/17 12/07/17  Darel Hong, MD  diclofenac sodium (VOLTAREN) 1 % GEL Apply 2 g topically 3 (three) times daily as needed (Use on elbow bursitis and knee). 07/04/17   [name], [name], DO  dicyclomine (BENTYL) 10 MG capsule Take 1 capsule (10 mg total) by mouth 4 (four) times daily -  before meals and at bedtime. 07/31/17   [name], Alexander J, DO  ELIQUIS 5 MG TABS tablet TAKE ONE TABLET BY MOUTH TWICE DAILY 07/02/17   [name], MD  furosemide (LASIX) 40 MG tablet TAKE ONE TABLET BY MOUTH ONCE DAILY 06/12/17   [name], MD  gabapentin (NEURONTIN) 100 MG capsule Take 1 capsule (100 mg total) by mouth 3 (three) times daily. 09/05/17   [name], Alexander J, DO  glucose blood (ONE TOUCH ULTRA TEST) test strip 1 each by Other route 2 (two) times daily. Use as instructed to check bloods sugar twice a day. 09/27/17   [name], [name], DO  glucose monitoring kit  (FREESTYLE) monitoring kit 1 each by Does not apply route as needed for other. 05/17/12   [name], MD  LANCETS ULTRA THIN MISC 1 Device by Does not apply route 4 (four) times daily -  before meals and at bedtime. 05/17/12   [name], MD  levothyroxine (SYNTHROID, LEVOTHROID) 88 MCG tablet Take 1 tablet (88 mcg total) by mouth daily. 05/11/17   [name], [name], DO  losartan (COZAAR) 25 MG tablet Take 1 tablet (25 mg total) by mouth daily. 10/09/17 01/07/18  [name], MD  metFORMIN (GLUCOPHAGE) 500 MG tablet TAKE TWO TABLETS BY MOUTH TWICE DAILY WITH FOOD Patient taking differently: Take 1,000 mg by mouth 2 (two) times daily with a meal.  02/19/17   [name], [name], DO  metoprolol tartrate (LOPRESSOR) 25 MG tablet Take 1 tablet (25 mg total) by mouth 2 (two) times daily. 07/10/17 10/09/17  [name], MD  montelukast (SINGULAIR) 10 MG tablet Take 10 mg by mouth at bedtime.  02/08/15   [provider]  prednisoLONE 5 MG TABS tablet Take 5 mg by mouth daily.    [provider]  predniSONE (DELTASONE) 50 MG tablet Take 1 tablet (50 mg total) by mouth daily for 4 days. 12/02/17 12/06/17  Darel Hong, MD  SYMBICORT 160-4.5 MCG/ACT inhaler Inhale 2 puffs 2 (two) times daily into the lungs.  08/11/16   [provider]    Allergies Bydureon [exenatide]; Clindamycin/lincomycin; Penicillins; Sulfa antibiotics; Spiriferis; and Ivp dye [iodinated diagnostic agents]  Family History  Problem Relation Age of Onset  . Heart disease Mother   . Clotting disorder Mother   . Hypertension Sister     Social History Social History   Tobacco Use  . Smoking status: Former Smoker    Packs/day: 1.00    Years: 8.00    Pack years: 8.00    Types: Cigarettes    Last attempt to quit: 12/31/1967    Years since quitting: 49.9  . Smokeless tobacco: Never Used  Substance Use Topics  . Alcohol use: No    Alcohol/week: 0.0 oz    Comment: 05/06/2012  "last alcohol several years ago; never had problem wit"  . Drug use: No    Review of Systems Constitutional: No fever/chills Eyes: No visual changes. ENT: No sore throat. Cardiovascular: Positive for chest pain. Respiratory: Positive for shortness of breath. Gastrointestinal: No abdominal pain.  No nausea, no vomiting.  No diarrhea.  No constipation. Genitourinary: Negative for dysuria. Musculoskeletal: Negative for back pain. Skin: Positive for rash. Neurological: Negative for headaches, focal weakness or numbness.   ____________________________________________   PHYSICAL EXAM:  VITAL SIGNS: ED Triage Vitals [12/02/17 0117]  Enc Vitals Group     BP 132/80     Pulse Rate 72     Resp 15     Temp 98.3 F (36.8 C)     Temp Source Oral     SpO2 96 %     Weight      Height      Head Circumference      Peak Flow      Pain Score      Pain Loc      Pain Edu?      Excl. in [place]?     Constitutional: Alert and oriented x4 obviously uncomfortable itching his arms and his chest Eyes: PERRL EOMI. Head: Atraumatic. Nose: No congestion/rhinnorhea. Mouth/Throat: No trismus Neck: No stridor.   Cardiovascular: Tachycardic rate, regular rhythm. Grossly normal heart sounds.  Good peripheral circulation. Respiratory: Increased respiratory effort.  No retractions.  Mild wheeze throughout Gastrointestinal: Soft nontender Musculoskeletal: No lower extremity edema   Neurologic:  Normal speech and language. No gross focal neurologic deficits are appreciated. Skin: Diffuse blanching urticaria to face chest and arms. Psychiatric: Mood and affect are normal. Speech and behavior are normal.    ____________________________________________   DIFFERENTIAL includes but not limited to  Anaphylaxis, allergic reaction, Stevens-Johnson syndrome, DRESS syndrome ____________________________________________   LABS (all labs ordered are listed, but only abnormal results are displayed)  Labs  Reviewed - No data to display   __________________________________________  EKG    ____________________________________________  RADIOLOGY   ____________________________________________   PROCEDURES  Procedure(s) performed: no  .Critical Care Performed by: Darel Hong, MD Authorized by:  Darel Hong, MD   Critical care provider statement:    Critical care time (minutes):  30   Critical care time was exclusive of:  Separately billable procedures and treating other patients   Critical care was necessary to treat or prevent imminent or life-threatening deterioration of the following conditions: anaphylaxis.   Critical care was time spent personally by me on the following activities:  Development of treatment plan with patient or surrogate, discussions with consultants, evaluation of patient's response to treatment, examination of patient, obtaining history from patient or surrogate, ordering and performing treatments and interventions, ordering and review of laboratory studies, ordering and review of radiographic studies, pulse oximetry, re-evaluation of patient's condition and review of old charts    Critical Care performed: Yes  ____________________________________________   INITIAL IMPRESSION / ASSESSMENT AND PLAN / ED COURSE  Pertinent labs & imaging results that were available during my care of the patient were reviewed by me and considered in my medical decision making (see chart for details).   As part of my medical decision making, I reviewed the following data within the [place] History obtained from family if available, nursing notes, old chart and ekg, as well as notes from prior ED visits.  The patient arrives with diffuse urticaria along with shortness of breath and wheezing after a new medication which is consistent with anaphylaxis.  I administered an epinephrine injection to his right lateral thigh and within 10 minutes his symptoms  have dramatically improved.  We will give him fluids, Solu-Medrol, Benadryl, and Pepcid and observed for a total of 4 hours.  After 4 hours of observation the patient's symptoms have not recurred.  Will prescribe him an epi-pen for home as well as 4 more days of prednisone and cetirizine for the itching rash.  He understands to follow-up with primary care for possible allergy testing.      ____________________________________________   FINAL CLINICAL IMPRESSION(S) / ED DIAGNOSES  Final diagnoses:  Anaphylaxis, initial encounter      NEW MEDICATIONS STARTED DURING THIS VISIT:  Discharge Medication List as of 12/02/2017  4:47 AM    START taking these medications   Details  Cetirizine HCl 10 MG CAPS Take 1 capsule (10 mg total) by mouth 4 (four) times daily for 5 days., Starting Sun 12/02/2017, Until Fri 12/07/2017, Print    EPINEPHrine (ADRENACLICK) 0.3 AF/7.9 mL IJ SOAJ injection Inject 0.3 mLs (0.3 mg total) into the muscle once for 1 dose., Starting Sun 12/02/2017, Print         Note:  This document was prepared using Dragon voice recognition software and may include unintentional dictation errors.     Darel Hong, MD 12/04/17 [phone]

## 2017-12-03 ENCOUNTER — Ambulatory Visit: Payer: Medicare Other | Admitting: Family Medicine

## 2017-12-03 ENCOUNTER — Encounter: Payer: Self-pay | Admitting: Family Medicine

## 2017-12-03 VITALS — BP 100/56 | HR 59 | Temp 98.9°F | Resp 16 | Ht 71.0 in | Wt 221.0 lb

## 2017-12-03 DIAGNOSIS — T782XXA Anaphylactic shock, unspecified, initial encounter: Secondary | ICD-10-CM

## 2017-12-03 DIAGNOSIS — M7021 Olecranon bursitis, right elbow: Secondary | ICD-10-CM

## 2017-12-03 DIAGNOSIS — E1142 Type 2 diabetes mellitus with diabetic polyneuropathy: Secondary | ICD-10-CM

## 2017-12-03 NOTE — Progress Notes (Signed)
Subjective:    Patient ID: Philip [name]., male    DOB: 10-Jun-1945, 72 y.o.   MRN: [id number]  BEXLEY MCLESTER Sr. is a 72 y.o. male presenting on 12/03/2017 for Hospitalization Follow-up (rash side effect of meds)  History provided by patient, and also by wife, [name].  HPI   ED FOLLOW-UP VISIT  Hospital/Location: [facility] Date of ED Visit: 12/02/17  Reason for Presenting to ED: Anaphylaxis, acute Primary (+Secondary) Diagnosis: Anaphylaxis reaction - uncertain trigger (suspected Bydureon)  FOLLOW-U - ED provider note and record have been reviewed - Patient presents today about 1 day after recent ED visit. Brief summary of recent course, patient had symptoms on Saturday 12/01/17 evening, with itching initially on head and scalp and also had "redness" and hives across trunk and arms and hands, he first put some benadryl cream and benadryl tablet, then limited relief and about 1 hour later he felt some "chest tightness" and he took a breathing treatment with Albuterol and some improvement. He warned his wife about this, and then they ultimately decided to go directly to hospital ED for evaluation. He was diagnosed with anaphylaxis reaction, he was given IV fluid 500 cc bolus and also given Epinephrine 0.3mg  injection, Methylprednisolone 125mg  injection, Benadryl 50mg  IM, Famotidine 20mg  IV, he also had episode of nausea vomiting in hospital ED - Ultimately unsure what triggered or caused the episode, but he thinks may be Bydureon BCise may have been the only possible change, he normally takes this on Friday nights but this week he skipped dose and took on Saturday night - about 3-4 hours later he first got symptoms. - His symptoms improved overall. He was discharged from ED and treated with given rx Prednisone 50mg  dosage x 4 more days, Cetirizine 10mg , Epinephrine EpiPen.  - Today reports overall has done well after discharge from ED. Symptoms of generalized erythematous rash and urticaria and  chest tightness have resolved - He continues to take Prednisone higher dose 50mg  for next 4 days, then back to chronic low dose prednisone for respiratory at 5mg  daily  - New medications on discharge: Prednisone 50mg , Cetirizine, Epi Pen - Changes to current meds on discharge: None  Additional concern - Right Elbow Olecranon Bursitis Prior history of Left Olceranon Bursitis - Prior 06/2017, doxy and diclofenac topical with good result at that time. Suspected trigger with direct pressure on elbow previously.  Admits some swelling and redness of R elbow now with erythema still of R forearm. - For other joints - knees, followed Dr Marry Guan and [facility]  Denies any fevers chills other spreading redness rash, other joint pain or swelling, dyspnea, cough,  I have reviewed the discharge medication list, and have reconciled the current and discharge medications today.  Depression screen [facility] 2/9 09/18/2017 07/31/2017 05/14/2017  Decreased Interest 0 0 0  Down, Depressed, Hopeless 0 0 0  PHQ - 2 Score 0 0 0  Altered sleeping 0 0 -  Tired, decreased energy 0 0 -  Change in appetite 0 0 -  Feeling bad or failure about yourself  0 0 -  Trouble concentrating 0 0 -  Moving slowly or fidgety/restless 0 0 -  Suicidal thoughts 0 0 -  PHQ-9 Score 0 0 -  Difficult doing work/chores Not difficult at all Not difficult at all -    Social History   Tobacco Use  . Smoking status: Former Smoker    Packs/day: 1.00    Years: 8.00  Pack years: 8.00    Types: Cigarettes    Last attempt to quit: 12/31/1967    Years since quitting: 49.9  . Smokeless tobacco: Never Used  Substance Use Topics  . Alcohol use: No    Alcohol/week: 0.0 oz    Comment: 05/06/2012 "last alcohol several years ago; never had problem wit"  . Drug use: No    Review of Systems Per HPI unless specifically indicated above     Objective:    BP (!) 100/56   Pulse (!) 59   Temp 98.9 F (37.2 C) (Oral)   Resp 16   Ht 5\' 11"   (1.803 m)   Wt 221 lb (100.2 kg)   BMI 30.82 kg/m   Wt Readings from Last 3 Encounters:  12/03/17 221 lb (100.2 kg)  12/02/17 220 lb (99.8 kg)  10/09/17 219 lb 12 oz (99.7 kg)    Physical Exam  Constitutional: He is oriented to person, place, and time. He appears well-developed and well-nourished. No distress.  Well-appearing, comfortable, cooperative  HENT:  Head: Normocephalic and atraumatic.  Mouth/Throat: Oropharynx is clear and moist.  Eyes: Conjunctivae are normal. Right eye exhibits no discharge. Left eye exhibits no discharge.  Neck: Normal range of motion. Neck supple. No thyromegaly present.  Cardiovascular: Normal rate, regular rhythm, normal heart sounds and intact distal pulses.  No murmur heard. Pulmonary/Chest: Effort normal and breath sounds normal. No respiratory distress. He has no wheezes. He has no rales.  Musculoskeletal: Normal range of motion. He exhibits no edema.  Right elbow olecranon bursa swollen, non tender See picture  Lymphadenopathy:    He has no cervical adenopathy.  Neurological: He is alert and oriented to person, place, and time.  Skin: Skin is warm and dry. No rash noted. He is not diaphoretic. There is erythema (right forearm some residual erythem extending to elbow).  Psychiatric: He has a normal mood and affect. His behavior is normal.  Well groomed, good eye contact, normal speech and thoughts  Nursing note and vitals reviewed.    Right Elbow     Results for orders placed or performed in visit on 11/12/17  HM DIABETES EYE EXAM  Result Value Ref Range   HM Diabetic Eye Exam No Retinopathy No Retinopathy      Assessment & Plan:   Problem List Items Addressed This Visit    Type 2 diabetes mellitus with peripheral neuropathy ([place]) See A&P below regarding bydureon    Other Visit Diagnoses    Anaphylaxis, initial encounter    -  Primary RESOLVED Now residual erythema R arm, finishing prednisone. No further urticaria or respiratory  symptoms May take Cetirizine, H2 blocker as well Uncertain exact trigger, suspected to be Bydureon BCise given dosing - unfortunately will hold this and determine if symptoms resolve - future may have to find alternative DM regimen in future - HOLD Bydureon     Olecranon bursitis of right elbow      Consistent with acute Right olecranon bursitis, with possible evidence of secondary infection or cellulitis,  - Suspected from prolonged leaning and pressure Questionable given acute anaphylaxis vs allergic reaction with urticaria recently - Range of motion of elbow is slightly restricted - No injury or trauma  Plan 1. Reassurance, likely benign and will improve on Prednisone, RICE therapy, topical diclofenac - If not improved will provide oral antibiotic - Doxycycline within next 2-3 days - call back Avoid prolong leaning pressure on elbow Follow-up within few weeks if not improved, re-consider aspiration  if persistent - return to Ortho - Return criteria given if worsening cellulitis or joint infection, when to go to hospital sooner       No orders of the defined types were placed in this encounter.    Follow up plan: Return in about 2 weeks (around 12/19/2017) for DM A1c (off Bydureon 12/02/17 due to reaction), ?restart [place], Dysphagia/aspiration?[name]  [name], [facility] 12/03/2017, 11:19 PM

## 2017-12-03 NOTE — Patient Instructions (Addendum)
Thank you for coming to the office today.  STOP Bydureon - we will not refill for now. Likely cause of reaction.  We may need to get back on Januvia in future. Will determine at next visit  Finish Prednisone 50mg  daily for next 4 days - then resume lower dose Prednisone 5mg  once daily  May consider adding a Novolog dose of 5 units with meal only if sugar significantly elevated >300 or higher for few days while taking high dose prednisone  Continue Cetirizine 10mg  daily  Concerned about Right Elbow (Olecranon bursitis) similar to your LEFT back in 06/2017.  It may improve with rest, avoid direct pressure, can use ice packs, and may use Diclofenac (Voltaren) topical gel as needed for reducing inflammation, also continued Prednisone will help it.  If not improved by 2-3 days from now, still red, swollen and irritated call me back and we can send rx for antibiotic again - Doxycycline 100mg  twice daily for 10 days.  Re-schedule apt from 9/24 to 8/21 or after  Please schedule a Follow-up Appointment to: Return in about 2 weeks (around 12/19/2017) for DM A1c (off Bydureon 12/02/17 due to reaction), ?restart [place], Dysphagia/aspiration?.  If you have any other questions or concerns, please feel free to call the office or send a message through [place]. You may also schedule an earlier appointment if necessary.  Additionally, you may be receiving a survey about your experience at our office within a few days to 1 week by e-mail or mail. We value your feedback.  [name], DO [place]

## 2017-12-04 ENCOUNTER — Telehealth: Payer: Self-pay | Admitting: Family Medicine

## 2017-12-04 DIAGNOSIS — M7021 Olecranon bursitis, right elbow: Secondary | ICD-10-CM

## 2017-12-04 MED ORDER — DOXYCYCLINE HYCLATE 100 MG PO TABS: 100.0000 mg | ORAL_TABLET | Freq: Two times a day (BID) | ORAL | 0 refills | Status: DC

## 2017-12-04 NOTE — Telephone Encounter (Signed)
Patient informed. 

## 2017-12-04 NOTE — Telephone Encounter (Signed)
Pt  Called  Requesting that  Antibiotic called into [facility] [address]  [phone]

## 2017-12-04 NOTE — Telephone Encounter (Signed)
Last seen yesterday 12/03/17, as discussed he is notifying us that will need antibiotic as we were concerned about. Previously treated with Doxycycline for other elbow back in 06/2017. Will send Doxycycline again for R elbow now.  Strict return precautions were given at visit.  [name], [facility] 12/04/2017, 12:37 PM

## 2017-12-07 ENCOUNTER — Encounter: Payer: Self-pay | Admitting: Family Medicine

## 2017-12-07 ENCOUNTER — Ambulatory Visit: Payer: Medicare Other | Admitting: Family Medicine

## 2017-12-07 VITALS — BP 109/59 | HR 67 | Temp 98.5°F | Ht 71.0 in | Wt 222.2 lb

## 2017-12-07 DIAGNOSIS — M7021 Olecranon bursitis, right elbow: Secondary | ICD-10-CM | POA: Diagnosis not present

## 2017-12-07 MED ORDER — TRAMADOL HCL 50 MG PO TABS: 50.0000 mg | ORAL_TABLET | Freq: Three times a day (TID) | ORAL | 0 refills | Status: DC | PRN

## 2017-12-07 NOTE — Progress Notes (Signed)
Subjective:    Patient ID: Philip Husbands., male    DOB: Apr 13, 1946, 72 y.o.   MRN: [id number]  Philip DY Sr. is a 72 y.o. male presenting on 12/07/2017 for Elbow Pain and Bursitis (right elbow)  Patient presents for a same day appointment. Patient provides most of history also accompanied by wife, Philip [name] who provides history  HPI   FOLLOW-UP Right Elbow Olecranon Bursitis - Recent course seen on 12/02/17 in ED for allergic reaction possible anaphylaxis, then followed with me on 12/03/17 was improved on prednisone higher dose (normally on chronic 5mg  daily) finished 4 days of high dose, then I treated him after he called back in 24 hours on 12/04/17 with worsening symptoms for empiric cellulitis or possible olecranon bursitis with Doxycycline antibiotic 100mg  BID for 10 days (as this was effective along with topical diclofenac for his last episode of same treatment in 06/2017 for L elbow) - Today seems rest of forearm redness is dramatically improved mostly resolved, but still has significant redness and swelling of R elbow seems to be worse over elbow - Still has 6 days left of Doxycycline - He follows with [facility] Dr Marry Guan in past for knee, but never for elbow. Improving generalized itching and rash has mostly resolved after prednisone Admits pain with R elbow and it was keeping him awake at night, asking about pain medicine Denies fevers, drainage pus, nausea vomiting, other joint redness or swelling   Depression screen [facility] 2/9 12/07/2017 09/18/2017 07/31/2017  Decreased Interest 0 0 0  Down, Depressed, Hopeless 0 0 0  PHQ - 2 Score 0 0 0  Altered sleeping - 0 0  Tired, decreased energy - 0 0  Change in appetite - 0 0  Feeling bad or failure about yourself  - 0 0  Trouble concentrating - 0 0  Moving slowly or fidgety/restless - 0 0  Suicidal thoughts - 0 0  PHQ-9 Score - 0 0  Difficult doing work/chores - Not difficult at all Not difficult at all    Social History    Tobacco Use  . Smoking status: Former Smoker    Packs/day: 1.00    Years: 8.00    Pack years: 8.00    Types: Cigarettes    Last attempt to quit: 12/31/1967    Years since quitting: 49.9  . Smokeless tobacco: Never Used  Substance Use Topics  . Alcohol use: No    Alcohol/week: 0.0 standard drinks    Comment: 05/06/2012 "last alcohol several years ago; never had problem wit"  . Drug use: No    Review of Systems Per HPI unless specifically indicated above     Objective:    BP (!) 109/59 (BP Location: Left Arm, Patient Position: Sitting, Cuff Size: Normal)   Pulse 67   Temp 98.5 F (36.9 C) (Oral)   Ht 5\' 11"  (1.803 m)   Wt 222 lb 3.2 oz (100.8 kg)   BMI 30.99 kg/m   Wt Readings from Last 3 Encounters:  12/07/17 222 lb 3.2 oz (100.8 kg)  12/03/17 221 lb (100.2 kg)  12/02/17 220 lb (99.8 kg)    Physical Exam  Constitutional: He is oriented to person, place, and time. He appears well-developed and well-nourished. No distress.  Well-appearing, comfortable, cooperative  HENT:  Head: Normocephalic and atraumatic.  Mouth/Throat: Oropharynx is clear and moist.  Eyes: Conjunctivae are normal. Right eye exhibits no discharge. Left eye exhibits no discharge.  Neck: Normal range of motion. Neck supple.  No thyromegaly present.  Cardiovascular: Normal rate, regular rhythm, normal heart sounds and intact distal pulses.  No murmur heard. Pulmonary/Chest: Effort normal and breath sounds normal. No respiratory distress. He has no wheezes. He has no rales.  Musculoskeletal: Normal range of motion. He exhibits no edema.  Right elbow olecranon bursa swollen, non tender - Warm to touch, no extending erythema - Seems slightly more erythematous directly over elbow, otherwise rest of forearm has improved - Good range of motion somewhat limited by discomfort and pain with near complete flexion See picture  Lymphadenopathy:    He has no cervical adenopathy.  Neurological: He is alert and  oriented to person, place, and time.  Skin: Skin is warm and dry. No rash noted. He is not diaphoretic. There is erythema (resolved R forearm - now only over elbow).  Psychiatric: He has a normal mood and affect. His behavior is normal.  Well groomed, good eye contact, normal speech and thoughts  Nursing note and vitals reviewed.   Right elbow   Right Elbow       Assessment & Plan:   Problem List Items Addressed This Visit    None    Visit Diagnoses    Olecranon bursitis of right elbow    -  Primary   Relevant Medications   traMADol (ULTRAM) 50 MG tablet      Consistent with persistent to worsening localized R elbow bursitis with still inflammatory changes, however no systemic symptoms or extending erythema. Surrounding forearm skin seems improved with start of antibiotic doxycycline. - Previously thought provoked by direct pressure and leaning - Allergic reaction is resolving now after high dose prednisone. No other urticaria or rash - Range of motion of elbow is slightly restricted in flexion still due to pain - No injury or trauma  Plan 1. Limited other therapy to offer at this time - already finished prednisone burst, already on antibiotics with Doxycycline has 6 more days - should continue this, due to his multiple antibiotic allergies (PCN, Sulfa, Clinda) I do not feel comfortable adding dual coverage antibiotic at this time, we may extend course to 14 days if needed. - Due to pain will offer Tramadol rx 50mg  q 8 hr PRN #15 for 5 day supply for acute problem - precautions given - Continue with ice packs, instead of heating pad he was using - May use topical diclofenac - Additionally advised him to contact [facility] - to see if they can get him worked in soon to be evaluated, may need a joint aspiration  Strict return criteria given if worsening cellulitis or joint infection, when to go to hospital sooner    Meds ordered this encounter  Medications  . traMADol  (ULTRAM) 50 MG tablet    Sig: Take 1 tablet (50 mg total) by mouth every 8 (eight) hours as needed for moderate pain.    Dispense:  15 tablet    Refill:  0    Follow up plan: Return in about 1 week (around 12/14/2017), or if symptoms worsen or fail to improve, for elbow bursitis.  [name], [facility] 12/07/2017, 3:20 PM

## 2017-12-07 NOTE — Patient Instructions (Addendum)
Thank you for coming to the office today.  Tramadol as needed for pain - if not effective by Monday-Tues you can contact us and we can try stronger pain medicines  Call [facility] - to check see if they can schedule you more urgently to have evaluation of the elbow and if need to draw fluid off and test it or treat further.  Continue Doxycycline for now - may need to extend for total of 2 weeks, instead of 10 days  Continue ice packs, elevated at heart level  If not improving you may need to return for re-evaluation. But if more severe worsening such as spreading redness or streaking redness, significantly larger size, persistent drainage of pus, increased pain, fevers/chills, nausea vomiting and cannot take antibiotic. If significantly worse symptoms or most of these symptoms, would recommend going straight to Hospital Emergency Dept as you may require IV antibiotics instead.  Please schedule a Follow-up Appointment to: Return in about 1 week (around 12/14/2017), or if symptoms worsen or fail to improve, for elbow bursitis.  If you have any other questions or concerns, please feel free to call the office or send a message through [place]. You may also schedule an earlier appointment if necessary.  Additionally, you may be receiving a survey about your experience at our office within a few days to 1 week by e-mail or mail. We value your feedback.  [name], DO [place]

## 2017-12-10 DIAGNOSIS — L03113 Cellulitis of right upper limb: Secondary | ICD-10-CM | POA: Diagnosis not present

## 2017-12-10 DIAGNOSIS — M25521 Pain in right elbow: Secondary | ICD-10-CM | POA: Insufficient documentation

## 2017-12-10 DIAGNOSIS — B029 Zoster without complications: Secondary | ICD-10-CM | POA: Insufficient documentation

## 2017-12-18 DIAGNOSIS — L03113 Cellulitis of right upper limb: Secondary | ICD-10-CM | POA: Diagnosis not present

## 2017-12-18 DIAGNOSIS — E1369 Other specified diabetes mellitus with other specified complication: Secondary | ICD-10-CM | POA: Diagnosis not present

## 2017-12-25 ENCOUNTER — Ambulatory Visit (INDEPENDENT_AMBULATORY_CARE_PROVIDER_SITE_OTHER): Payer: Medicare Other

## 2017-12-25 ENCOUNTER — Ambulatory Visit: Payer: Medicare Other | Admitting: Family Medicine

## 2017-12-25 ENCOUNTER — Encounter: Payer: Self-pay | Admitting: Family Medicine

## 2017-12-25 VITALS — BP 100/66 | HR 65 | Temp 98.9°F | Resp 16 | Ht 71.0 in | Wt 216.4 lb

## 2017-12-25 DIAGNOSIS — Z0000 Encounter for general adult medical examination without abnormal findings: Secondary | ICD-10-CM

## 2017-12-25 DIAGNOSIS — E1142 Type 2 diabetes mellitus with diabetic polyneuropathy: Secondary | ICD-10-CM

## 2017-12-25 DIAGNOSIS — M7021 Olecranon bursitis, right elbow: Secondary | ICD-10-CM

## 2017-12-25 LAB — POCT GLYCOSYLATED HEMOGLOBIN (HGB A1C): HEMOGLOBIN A1C: 9.3 % — AB (ref 4.0–5.6)

## 2017-12-25 MED ORDER — SITAGLIPTIN PHOSPHATE 100 MG PO TABS: 100.0000 mg | ORAL_TABLET | Freq: Every day | ORAL | 5 refills | Status: DC

## 2017-12-25 NOTE — Progress Notes (Signed)
Subjective:    Patient ID: Philip Husbands., male    DOB: Nov 24, 1945, 72 y.o.   MRN: [id number]  Philip GREULICH Sr. is a 72 y.o. male presenting on 12/25/2017 for Diabetes   HPI   He will also meet with Tyler Aas LPN today for Annual Medicare Wellness visit.  CHRONIC DM, Type 2: Last visit 09/18/17 for diabetes - Interval update after treated for olecranon bursitis and cellulitis, had extended antibiotics and prednisone,  - CBG readings recently low 119 in AM fasting, has had variable readings avg about 170-200, previously during infection recently he had up to CBG >400 - Interval update, pulmonology reduced his prednisone from alternating 5 to 10mg  every other day,  Today elevated A1c >9 Meds: Metformin 1000mg  twice daily (500mg  tabs) - OFF Januvia 100mg  daily - interested to restart - OFF Bydureon due to possible allergy. Remains off Lantus insulin - Still taking Prednisone daily from Pulmonology Reports good complianceother than insulin. Tolerating well w/o side-effects Currently on ARB, on Statin Lifestyle: - Diet:significantly improved DM diet - Exercise: Walking at work, no other changes - Complicated by peripheral neuropathy and CKD-III Last DM Eye in 06/2017 at [facility] Denies hypoglycemia  Follow-up Right Arm Cellulitis / Olecranon Bursitis - Significantly improved, required higher dose prednisone, had larger area with pain radiating down arm, now it is more localized with pain/discomfort. Edema is improved. - He will get sports sleeve wrap with padded elbow cushion - He was seen by me on 12/07/17, and given doxycycline and he was referred to [facility], they extended doxycycline as it was improving on the antibiotic, they extended this to 5 more days   Depression screen [facility] 2/9 12/25/2017 12/25/2017 12/07/2017  Decreased Interest 0 0 0  Down, Depressed, Hopeless 0 0 0  PHQ - 2 Score 0 0 0  Altered sleeping - - -  Tired, decreased energy - - -  Change in  appetite - - -  Feeling bad or failure about yourself  - - -  Trouble concentrating - - -  Moving slowly or fidgety/restless - - -  Suicidal thoughts - - -  PHQ-9 Score - - -  Difficult doing work/chores - - -    Social History   Tobacco Use  . Smoking status: Former Smoker    Packs/day: 1.00    Years: 8.00    Pack years: 8.00    Types: Cigarettes    Last attempt to quit: 12/31/1967    Years since quitting: 50.0  . Smokeless tobacco: Never Used  Substance Use Topics  . Alcohol use: No    Alcohol/week: 0.0 standard drinks    Comment: 05/06/2012 "last alcohol several years ago; never had problem wit"  . Drug use: No    Review of Systems Per HPI unless specifically indicated above     Objective:    BP 100/66   Pulse 65   Temp 98.9 F (37.2 C) (Oral)   Resp 16   Ht 5\' 11"  (1.803 m)   Wt 216 lb 6.4 oz (98.2 kg)   SpO2 95%   BMI 30.18 kg/m   Wt Readings from Last 3 Encounters:  12/25/17 216 lb 6.4 oz (98.2 kg)  12/25/17 216 lb 6.4 oz (98.2 kg)  12/07/17 222 lb 3.2 oz (100.8 kg)    Physical Exam  Constitutional: He is oriented to person, place, and time. He appears well-developed and well-nourished. No distress.  Well-appearing, comfortable, cooperative  HENT:  Head: Normocephalic  and atraumatic.  Mouth/Throat: Oropharynx is clear and moist.  Eyes: Conjunctivae are normal. Right eye exhibits no discharge. Left eye exhibits no discharge.  Neck: Normal range of motion. Neck supple. No thyromegaly present.  Cardiovascular: Normal rate, regular rhythm, normal heart sounds and intact distal pulses.  No murmur heard. Pulmonary/Chest: Effort normal and breath sounds normal. No respiratory distress. He has no wheezes. He has no rales.  Musculoskeletal: Normal range of motion. He exhibits no edema.  Lymphadenopathy:    He has no cervical adenopathy.  Neurological: He is alert and oriented to person, place, and time.  Skin: Skin is warm and dry. No rash noted. He is not  diaphoretic. No erythema.  Significantly improved Right upper extremity arm and lower forearm and elbow now without significant erythema and rash, it is resolved, has some dry flaking skin only. Slightly swollen localized over tip of olecranon otherwise normal, has wrap in place.  Psychiatric: He has a normal mood and affect. His behavior is normal.  Well groomed, good eye contact, normal speech and thoughts  Nursing note and vitals reviewed.    Recent Labs    05/07/17 0808 09/18/17 1617 12/25/17 1451  HGBA1C 8.7* 7.2* 9.3*    Results for orders placed or performed in visit on 12/25/17  POCT HgB A1C  Result Value Ref Range   Hemoglobin A1C 9.3 (A) 4.0 - 5.6 %      Assessment & Plan:   Problem List Items Addressed This Visit    Type 2 diabetes mellitus with peripheral neuropathy ([place]) - Primary    Now dramatically worsened DM control with A1c up to 9.3 from 7.2 Attributed to prednisone dosing recently and acute olecranon bursitis / cellulitis - He has self discontinued both GLP1 bydureon (allergy) and lantus insulin Complications - CKD-III, peripheral neuropathy, cataracts  Plan:  1. RESTART Januvia 100mg  daily - new rx sent - Remain off Lantus insulin - Continue Metformin 1000mg  BID (500mg  x 2 BID) 2. Encourage improved lifestyle - low carb, low sugar diet, reduce portion size, continue improving regular exercise 3. Check CBG, bring log to next visit for review 4. Continue ARB, Statin 5. Follow-up 3 months DM A1c      Relevant Medications   sitaGLIPtin (JANUVIA) 100 MG tablet   Other Relevant Orders   POCT HgB A1C (Completed)    Other Visit Diagnoses    Olecranon bursitis of right elbow        Resolving olecranon bursitis and extending cellulitis, s/p prolonged doxycycline and followed by [facility] as well - Trial with sleeves with elbow padding to avoid recurrent flare or problem - Follow-up as needed   Meds ordered this encounter  Medications  . sitaGLIPtin  (JANUVIA) 100 MG tablet    Sig: Take 1 tablet (100 mg total) by mouth daily.    Dispense:  30 tablet    Refill:  5    Follow up plan: Return in about 3 months (around 03/27/2018) for DM A1c.  [name], [facility] 12/25/2017, 6:26 PM

## 2017-12-25 NOTE — Patient Instructions (Signed)
Philip [name] , Thank you for taking time to come for your Medicare Wellness Visit. I appreciate your ongoing commitment to your health goals. Please review the following plan we discussed and let me know if I can assist you in the future.   Screening recommendations/referrals: Colonoscopy: completed 11/29/2013 Recommended yearly ophthalmology/optometry visit for glaucoma screening and checkup Recommended yearly dental visit for hygiene and checkup  Vaccinations: Influenza vaccine: due 01/2018 Pneumococcal vaccine: completed series Tdap vaccine: up to date Shingles vaccine: shingrix eligible, check with your insurance company for coverage     Advanced directives: Advance directive discussed with you today. Even though you declined this today please call our office should you change your mind and we can give you the proper paperwork for you to fill out.  Conditions/risks identified: recommend drinking at least 6-8 glasses of water a day   Next appointment: Follow up in one year for your annual wellness exam.   Preventive Care 65 Years and Older, Male Preventive care refers to lifestyle choices and visits with your health care provider that can promote health and wellness. What does preventive care include?  A yearly physical exam. This is also called an annual well check.  Dental exams once or twice a year.  Routine eye exams. Ask your health care provider how often you should have your eyes checked.  Personal lifestyle choices, including:  Daily care of your teeth and gums.  Regular physical activity.  Eating a healthy diet.  Avoiding tobacco and drug use.  Limiting alcohol use.  Practicing safe sex.  Taking low doses of aspirin every day.  Taking vitamin and mineral supplements as recommended by your health care provider. What happens during an annual well check? The services and screenings done by your health care provider during your annual well check will depend on your  age, overall health, lifestyle risk factors, and family history of disease. Counseling  Your health care provider may ask you questions about your:  Alcohol use.  Tobacco use.  Drug use.  Emotional well-being.  Home and relationship well-being.  Sexual activity.  Eating habits.  History of falls.  Memory and ability to understand (cognition).  Work and work Statistician. Screening  You may have the following tests or measurements:  Height, weight, and BMI.  Blood pressure.  Lipid and cholesterol levels. These may be checked every 5 years, or more frequently if you are over 11 years old.  Skin check.  Lung cancer screening. You may have this screening every year starting at age 29 if you have a 30-pack-year history of smoking and currently smoke or have quit within the past 15 years.  Fecal occult blood test (FOBT) of the stool. You may have this test every year starting at age 14.  Flexible sigmoidoscopy or colonoscopy. You may have a sigmoidoscopy every 5 years or a colonoscopy every 10 years starting at age 79.  Prostate cancer screening. Recommendations will vary depending on your family history and other risks.  Hepatitis C blood test.  Hepatitis B blood test.  Sexually transmitted disease (STD) testing.  Diabetes screening. This is done by checking your blood sugar (glucose) after you have not eaten for a while (fasting). You may have this done every 1-3 years.  Abdominal aortic aneurysm (AAA) screening. You may need this if you are a current or former smoker.  Osteoporosis. You may be screened starting at age 41 if you are at high risk. Talk with your health care provider about your test  results, treatment options, and if necessary, the need for more tests. Vaccines  Your health care provider may recommend certain vaccines, such as:  Influenza vaccine. This is recommended every year.  Tetanus, diphtheria, and acellular pertussis (Tdap, Td) vaccine. You  may need a Td booster every 10 years.  Zoster vaccine. You may need this after age 75.  Pneumococcal 13-valent conjugate (PCV13) vaccine. One dose is recommended after age 39.  Pneumococcal polysaccharide (PPSV23) vaccine. One dose is recommended after age 51. Talk to your health care provider about which screenings and vaccines you need and how often you need them. This information is not intended to replace advice given to you by your health care provider. Make sure you discuss any questions you have with your health care provider. Document Released: 05/14/2015 Document Revised: 01/05/2016 Document Reviewed: 02/16/2015 Elsevier Interactive Patient Education  2017 [place] Prevention in the Home Falls can cause injuries. They can happen to people of all ages. There are many things you can do to make your home safe and to help prevent falls. What can I do on the outside of my home?  Regularly fix the edges of walkways and driveways and fix any cracks.  Remove anything that might make you trip as you walk through a door, such as a raised step or threshold.  Trim any bushes or trees on the path to your home.  Use bright outdoor lighting.  Clear any walking paths of anything that might make someone trip, such as rocks or tools.  Regularly check to see if handrails are loose or broken. Make sure that both sides of any steps have handrails.  Any raised decks and porches should have guardrails on the edges.  Have any leaves, snow, or ice cleared regularly.  Use sand or salt on walking paths during winter.  Clean up any spills in your garage right away. This includes oil or grease spills. What can I do in the bathroom?  Use night lights.  Install grab bars by the toilet and in the tub and shower. Do not use towel bars as grab bars.  Use non-skid mats or decals in the tub or shower.  If you need to sit down in the shower, use a plastic, non-slip stool.  Keep the floor  dry. Clean up any water that spills on the floor as soon as it happens.  Remove soap buildup in the tub or shower regularly.  Attach bath mats securely with double-sided non-slip rug tape.  Do not have throw rugs and other things on the floor that can make you trip. What can I do in the bedroom?  Use night lights.  Make sure that you have a light by your bed that is easy to reach.  Do not use any sheets or blankets that are too big for your bed. They should not hang down onto the floor.  Have a firm chair that has side arms. You can use this for support while you get dressed.  Do not have throw rugs and other things on the floor that can make you trip. What can I do in the kitchen?  Clean up any spills right away.  Avoid walking on wet floors.  Keep items that you use a lot in easy-to-reach places.  If you need to reach something above you, use a strong step stool that has a grab bar.  Keep electrical cords out of the way.  Do not use floor polish or wax that makes  floors slippery. If you must use wax, use non-skid floor wax.  Do not have throw rugs and other things on the floor that can make you trip. What can I do with my stairs?  Do not leave any items on the stairs.  Make sure that there are handrails on both sides of the stairs and use them. Fix handrails that are broken or loose. Make sure that handrails are as long as the stairways.  Check any carpeting to make sure that it is firmly attached to the stairs. Fix any carpet that is loose or worn.  Avoid having throw rugs at the top or bottom of the stairs. If you do have throw rugs, attach them to the floor with carpet tape.  Make sure that you have a light switch at the top of the stairs and the bottom of the stairs. If you do not have them, ask someone to add them for you. What else can I do to help prevent falls?  Wear shoes that:  Do not have high heels.  Have rubber bottoms.  Are comfortable and fit you  well.  Are closed at the toe. Do not wear sandals.  If you use a stepladder:  Make sure that it is fully opened. Do not climb a closed stepladder.  Make sure that both sides of the stepladder are locked into place.  Ask someone to hold it for you, if possible.  Clearly mark and make sure that you can see:  Any grab bars or handrails.  First and last steps.  Where the edge of each step is.  Use tools that help you move around (mobility aids) if they are needed. These include:  Canes.  Walkers.  Scooters.  Crutches.  Turn on the lights when you go into a dark area. Replace any light bulbs as soon as they burn out.  Set up your furniture so you have a clear path. Avoid moving your furniture around.  If any of your floors are uneven, fix them.  If there are any pets around you, be aware of where they are.  Review your medicines with your doctor. Some medicines can make you feel dizzy. This can increase your chance of falling. Ask your doctor what other things that you can do to help prevent falls. This information is not intended to replace advice given to you by your health care provider. Make sure you discuss any questions you have with your health care provider. Document Released: 02/11/2009 Document Revised: 09/23/2015 Document Reviewed: 05/22/2014 Elsevier Interactive Patient Education  2017 [employer].

## 2017-12-25 NOTE — Progress Notes (Signed)
Subjective:   Philip SIMMERING Sr. is a 72 y.o. male who presents for Medicare Annual/Subsequent preventive examination.  Review of Systems:  Cardiac Risk Factors include: hypertension;male gender;advanced age (>6mn, >>22women);diabetes mellitus;dyslipidemia;obesity (BMI >30kg/m2);smoking/ tobacco exposure     Objective:    Vitals: BP 100/66 (BP Location: Left Arm, Patient Position: Sitting)   Pulse 65   Temp 98.9 F (37.2 C) (Oral)   Resp 16   Ht _0  (1.803 m)   Wt 216 lb 6.4 oz (98.2 kg)   SpO2 95%   BMI 30.18 kg/m   Body mass index is 30.18 kg/m.  Advanced Directives 12/25/2017 10/03/2017 09/19/2016 07/23/2015 01/06/2015 05/06/2012  Does Patient Have a Medical Advance Directive? No No No No Yes Patient does not have advance directive;Patient would not like information  Would patient like information on creating a medical advance directive? No - Patient declined No - Patient declined Yes (MAU/Ambulatory/Procedural Areas - Information given) No - patient declined information - -  Pre-existing out of facility DNR order (yellow form or pink MOST form) - - - - - No    Tobacco Social History   Tobacco Use  Smoking Status Former Smoker  . Packs/day: 1.00  . Years: 8.00  . Pack years: 8.00  . Types: Cigarettes  . Last attempt to quit: 12/31/1967  . Years since quitting: 50.0  Smokeless Tobacco Never Used     Counseling given: Not Answered   Clinical Intake:  Pre-visit preparation completed: Yes  Pain : No/denies pain     Nutritional Status: BMI > 30  Obese Nutritional Risks: None Diabetes: Yes CBG done?: No Did pt. bring in CBG monitor from home?: No  How often do you need to have someone help you when you read instructions, pamphlets, or other written materials from your doctor or pharmacy?: 1 - Never What is the last grade level you completed in school?: some college   Interpreter Needed?: No  Information entered by :: Tiffany Hill,LPN   Past Medical History:    Diagnosis Date  . Arthritis    a. right knee  . Asthma   . Atrial arrhythmia    [place] of nonsustained atach  . Chronic kidney disease   . 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 etiology.  .Marland KitchenCOPD (chronic obstructive pulmonary disease) ([place]   . Coronary artery disease    a. 05/2012 Cath: severe 3VD-->CABG by Dr. GServando Snarein 05/2012 with LIMA to LAD, SVG to LCX & SVG to RPDA.  .Marland KitchenCough    CHRONIC  . Dyspnea   . Dysrhythmia    AFIB  . Edema    LEGS/ FEET  . History of bronchitis   . HOH (hard of hearing)   . Hypercholesteremia   . Hypertension   . Hypothyroidism   . Neuropathy   . Shingles   . Sleep apnea    NO CPAP  . Thyroid disease   . Type II diabetes mellitus ([place]   . Wheezing   . Wheezing    Past Surgical History:  Procedure Laterality Date  . ANTERIOR CERVICAL DECOMP/DISCECTOMY FUSION  ~ 2009  . BACK SURGERY     CERVICAL FUSION  . CARDIAC CATHETERIZATION  05/06/2012   Significant three-vessel coronary artery disease with normal ejection fraction  . CATARACT EXTRACTION W/PHACO Right 08/15/2017   Procedure: CATARACT EXTRACTION PHACO AND INTRAOCULAR LENS PLACEMENT (IOC);  Surgeon: PBirder Robson MD;  Location: [facility];  Service: Ophthalmology;  Laterality:  Right;  [place] 00:23 AP% 13.3 CDE 3.11 Fluid pack lot # [id number]  . CATARACT EXTRACTION W/PHACO Left 10/03/2017   Procedure: CATARACT EXTRACTION PHACO AND INTRAOCULAR LENS PLACEMENT (IOC);  Surgeon: Birder Robson, MD;  Location: [facility];  Service: Ophthalmology;  Laterality: Left;  Lot #[id number] H [place]: 00:21.2 AP%:13.9 CDE: 2.96  . CORONARY ANGIOPLASTY    . CORONARY ARTERY BYPASS GRAFT  05/07/2012   Procedure: CORONARY ARTERY BYPASS GRAFTING (CABG);  Surgeon: [name], MD;  Location: [facility];  Service: Open Heart Surgery;  Laterality: N/A;  times three  . INTRAOPERATIVE TRANSESOPHAGEAL ECHOCARDIOGRAM  05/07/2012   Procedure: INTRAOPERATIVE TRANSESOPHAGEAL ECHOCARDIOGRAM;  Surgeon: [name], MD;   Location: [facility];  Service: Open Heart Surgery;  Laterality: N/A;  . KNEE ARTHROSCOPY  ~ 2000   "right" (05/06/2012)  . KNEE SURGERY  2003   right  . NECK SURGERY  2008   Plate in neck   Family History  Problem Relation Age of Onset  . Heart disease Mother   . Clotting disorder Mother   . Hypertension Sister    Social History   Socioeconomic History  . Marital status: Married    Spouse name: Not on file  . Number of children: Not on file  . Years of education: Not on file  . Highest education level: Some college, no degree  Occupational History  . Not on file  Social Needs  . Financial resource strain: Not hard at all  . Food insecurity:    Worry: Never true    Inability: Never true  . Transportation needs:    Medical: No    Non-medical: No  Tobacco Use  . Smoking status: Former Smoker    Packs/day: 1.00    Years: 8.00    Pack years: 8.00    Types: Cigarettes    Last attempt to quit: 12/31/1967    Years since quitting: 50.0  . Smokeless tobacco: Never Used  Substance and Sexual Activity  . Alcohol use: No    Alcohol/week: 0.0 standard drinks    Comment: 05/06/2012 "last alcohol several years ago; never had problem wit"  . Drug use: No  . Sexual activity: Not Currently  Lifestyle  . Physical activity:    Days per week: 0 days    Minutes per session: 0 min  . Stress: Not at all  Relationships  . Social connections:    Talks on phone: More than three times a week    Gets together: More than three times a week    Attends religious service: More than 4 times per year    Active member of club or organization: No    Attends meetings of clubs or organizations: Never    Relationship status: Married  Other Topics Concern  . Not on file  Social History Narrative   ** Merged History Encounter **          Working part time     Outpatient Encounter Medications as of 12/25/2017  Medication Sig  . albuterol (PROVENTIL) (2.5 MG/3ML) 0.083% nebulizer solution Inhale 3 mLs  into the lungs every 6 (six) hours as needed for wheezing or shortness of breath.   . Albuterol Sulfate 108 (90 Base) MCG/ACT AEPB Inhale 2 puffs into the lungs every 6 (six) hours as needed (shortness of breath).   . Artificial Tear Ointment (DRY EYES OP) Place 1 drop into both eyes daily as needed (for dry eyes).  [name] atorvastatin (LIPITOR) 40 MG tablet Take 1 tablet (  40 mg total) by mouth daily.  . diclofenac sodium (VOLTAREN) 1 % GEL Apply 2 g topically 3 (three) times daily as needed (Use on elbow bursitis and knee).  [name] dicyclomine (BENTYL) 10 MG capsule Take 1 capsule (10 mg total) by mouth 4 (four) times daily -  before meals and at bedtime.  [name] ELIQUIS 5 MG TABS tablet TAKE ONE TABLET BY MOUTH TWICE DAILY  . esomeprazole (NEXIUM) 10 MG packet Take by mouth.  . furosemide (LASIX) 40 MG tablet TAKE ONE TABLET BY MOUTH ONCE DAILY  . gabapentin (NEURONTIN) 100 MG capsule Take 1 capsule (100 mg total) by mouth 3 (three) times daily.  [name] glucose blood (ONE TOUCH ULTRA TEST) test strip 1 each by Other route 2 (two) times daily. Use as instructed to check bloods sugar twice a day.  [name] glucose monitoring kit (FREESTYLE) monitoring kit 1 each by Does not apply route as needed for other.  [name] LANCETS ULTRA THIN MISC 1 Device by Does not apply route 4 (four) times daily -  before meals and at bedtime.  [name] levothyroxine (SYNTHROID, LEVOTHROID) 88 MCG tablet Take 1 tablet (88 mcg total) by mouth daily.  [name] losartan (COZAAR) 25 MG tablet Take 1 tablet (25 mg total) by mouth daily.  . metFORMIN (GLUCOPHAGE) 500 MG tablet TAKE TWO TABLETS BY MOUTH TWICE DAILY WITH FOOD (Patient taking differently: Take 1,000 mg by mouth 2 (two) times daily with a meal. )  . montelukast (SINGULAIR) 10 MG tablet Take 10 mg by mouth at bedtime.   . predniSONE (DELTASONE) 5 MG tablet Take 5 mg by mouth daily.  . sitaGLIPtin (JANUVIA) 100 MG tablet Take 1 tablet (100 mg total) by mouth daily.  . SYMBICORT 160-4.5 MCG/ACT inhaler Inhale  2 puffs 2 (two) times daily into the lungs.   . Cetirizine HCl 10 MG CAPS Take 1 capsule (10 mg total) by mouth 4 (four) times daily for 5 days.  . metoprolol tartrate (LOPRESSOR) 25 MG tablet Take 1 tablet (25 mg total) by mouth 2 (two) times daily.  . [DISCONTINUED] BYDUREON BCISE 2 MG/0.85ML AUIJ Inject 2 mg into the skin once a week.  . [DISCONTINUED] doxycycline (VIBRA-TABS) 100 MG tablet Take 1 tablet (100 mg total) by mouth 2 (two) times daily. For 10 days. Take with full glass of water, stay upright 30 min after taking.  . [DISCONTINUED] prednisoLONE 5 MG TABS tablet Take 5 mg by mouth daily.  . [DISCONTINUED] traMADol (ULTRAM) 50 MG tablet Take 1 tablet (50 mg total) by mouth every 8 (eight) hours as needed for moderate pain. (Patient not taking: Reported on 12/25/2017)   No facility-administered encounter medications on file as of 12/25/2017.     Activities of Daily Living In your present state of health, do you have any difficulty performing the following activities: 12/25/2017 08/15/2017  Hearing? Y N  Comment no hearing aids  -  Vision? N N  Difficulty concentrating or making decisions? N N  Walking or climbing stairs? N N  Dressing or bathing? N N  Doing errands, shopping? N -  Preparing Food and eating ? N -  Using the Toilet? N -  In the past six months, have you accidently leaked urine? N -  Do you have problems with loss of bowel control? N -  Managing your Medications? N -  Managing your Finances? N -  Housekeeping or managing your Housekeeping? N -  Some recent data might be hidden    Patient Care  Team: [name], DO as PCP - General (Family Medicine) [name], MD as Consulting Physician (Cardiology) Leanor Kail, MD (Orthopedic Surgery) Erby Pian, MD as Referring Physician (Specialist)   Assessment:   This is a routine wellness examination for Philip [name].  Exercise Activities and Dietary recommendations Current Exercise Habits:  The patient does not participate in regular exercise at present, Exercise limited by: None identified  Goals    . Increase water intake     Recommend drinking 6-8 glasses of water a day.    . Weight < 200 lb (90.719 kg)     Pt wants to loose 30 pounds within year.       Fall Risk Fall Risk  12/25/2017 12/25/2017 12/07/2017 09/18/2017 05/14/2017  Falls in the past year? _0   Number falls in past yr: - - - - -  Injury with Fall? - - - - -   Is the patient's home free of loose throw rugs in walkways, pet beds, electrical cords, etc?   yes      Grab bars in the bathroom? no      Handrails on the stairs?   yes      Adequate lighting?   yes  Timed Get Up and Go Performed: Completed in 8 seconds with no use of assistive devices, steady gait. No intervention needed at this time.   Depression Screen PHQ 2/9 Scores 12/25/2017 12/25/2017 12/07/2017 09/18/2017  PHQ - 2 Score 0 0 0 0  PHQ- 9 Score - - - 0    Cognitive Function MMSE - Mini Mental State Exam 01/06/2015  Orientation to time 5  Orientation to Place 5  Registration 3  Attention/ Calculation 5  Recall 3  Language- name 2 objects 2  Language- repeat 1  Language- follow 3 step command 3  Language- read & follow direction 1  Write a sentence 1  Copy design 1  Total score 30     6CIT Screen 12/25/2017 09/19/2016  What Year? 0 points 0 points  What month? 0 points 0 points  What time? 0 points 0 points  Count back from 20 0 points 0 points  Months in reverse 0 points 0 points  Repeat phrase 0 points 4 points  Total Score 0 4    Immunization History  Administered Date(s) Administered  . Influenza, High Dose Seasonal PF 01/06/2015, 02/28/2017  . Influenza-Unspecified 02/08/2014  . Pneumococcal Conjugate-13 01/02/2014  . Pneumococcal Polysaccharide-23 05/01/2012  . Tdap 05/01/2012    Qualifies for Shingles Vaccine? Yes, discussed shingrix vaccine   Screening Tests Health Maintenance  Topic Date Due  .  INFLUENZA VACCINE  11/29/2017  . OPHTHALMOLOGY EXAM  06/21/2018  . HEMOGLOBIN A1C  06/27/2018  . FOOT EXAM  09/19/2018  . TETANUS/TDAP  05/01/2022  . COLONOSCOPY  11/30/2023  . Hepatitis C Screening  Completed  . PNA vac Low Risk Adult  Completed   Cancer Screenings: Lung: Low Dose CT Chest recommended if Age 24-80 years, 30 pack-year currently smoking OR have quit w/in 15years. Patient does not qualify. Colorectal: completed 11/29/2013  Additional Screenings:  Hepatitis C Screening:completed 05[birth year]01[birth year]      Plan:    I have personally reviewed and addressed the Medicare Annual Wellness questionnaire and have noted the following in the patient's chart:  A. Medical and social history B. Use of alcohol, tobacco or illicit drugs  C. Current medications and supplements D. Functional ability and status E.  Nutritional status F.  Physical activity G. Advance directives H. List of other physicians I.  Hospitalizations, surgeries, and ER visits in previous 12 months J.  [place] such as hearing and vision if needed, cognitive and depression L. Referrals and appointments   In addition, I have reviewed and discussed with patient certain preventive protocols, quality metrics, and best practice recommendations. A written personalized care plan for preventive services as well as general preventive health recommendations were provided to patient.   Signed,  Tyler Aas, LPN Nurse Health Advisor   Nurse Notes:none

## 2017-12-25 NOTE — Patient Instructions (Addendum)
Thank you for coming to the office today.  Please schedule and return for a NURSE ONLY VISIT for VACCINE - Approximately around November 2019 - Need High Dose Flu Vaccine  A1c 9.3, elevated, as discussed due to infection - and prednisone.  RESTART Januvia (Sitagliptin generic) - 100mg  once daily, sent to pharmacy, monthly supply - If need 3 month, let me know.  Continue Metformin 2 pills twice a day.  In the future may need a low dose insulin if nothing else is helping.  Keep trying to improve diet and stay active.  Follow-up as needed for elbow.  Please schedule a Follow-up Appointment to: Return in about 3 months (around 03/27/2018) for DM A1c.  If you have any other questions or concerns, please feel free to call the office or send a message through [place]. You may also schedule an earlier appointment if necessary.  Additionally, you may be receiving a survey about your experience at our office within a few days to 1 week by e-mail or mail. We value your feedback.  [name], DO [place]

## 2017-12-26 ENCOUNTER — Encounter: Payer: Self-pay | Admitting: Family Medicine

## 2017-12-26 NOTE — Assessment & Plan Note (Signed)
Now dramatically worsened DM control with A1c up to 9.3 from 7.2 Attributed to prednisone dosing recently and acute olecranon bursitis / cellulitis - He has self discontinued both GLP1 bydureon (allergy) and lantus insulin Complications - CKD-III, peripheral neuropathy, cataracts  Plan:  1. RESTART Januvia 100mg  daily - new rx sent - Remain off Lantus insulin - Continue Metformin 1000mg  BID (500mg  x 2 BID) 2. Encourage improved lifestyle - low carb, low sugar diet, reduce portion size, continue improving regular exercise 3. Check CBG, bring log to next visit for review 4. Continue ARB, Statin 5. Follow-up 3 months DM A1c

## 2018-01-03 ENCOUNTER — Other Ambulatory Visit: Payer: Self-pay | Admitting: Unknown Physician Specialty

## 2018-01-03 DIAGNOSIS — G8929 Other chronic pain: Secondary | ICD-10-CM | POA: Diagnosis not present

## 2018-01-03 DIAGNOSIS — M5442 Lumbago with sciatica, left side: Principal | ICD-10-CM

## 2018-01-03 DIAGNOSIS — M5417 Radiculopathy, lumbosacral region: Secondary | ICD-10-CM | POA: Insufficient documentation

## 2018-01-03 DIAGNOSIS — E1369 Other specified diabetes mellitus with other specified complication: Secondary | ICD-10-CM | POA: Diagnosis not present

## 2018-01-03 DIAGNOSIS — M25552 Pain in left hip: Secondary | ICD-10-CM | POA: Diagnosis not present

## 2018-01-16 ENCOUNTER — Ambulatory Visit
Admission: RE | Admit: 2018-01-16 | Discharge: 2018-01-16 | Disposition: A | Discharge status: Home / Self Care | Payer: Medicare Other | Source: Ambulatory Visit | Attending: Unknown Physician Specialty | Admitting: Unknown Physician Specialty

## 2018-01-16 DIAGNOSIS — G8929 Other chronic pain: Secondary | ICD-10-CM | POA: Insufficient documentation

## 2018-01-16 DIAGNOSIS — M5116 Intervertebral disc disorders with radiculopathy, lumbar region: Secondary | ICD-10-CM | POA: Diagnosis not present

## 2018-01-16 DIAGNOSIS — M47816 Spondylosis without myelopathy or radiculopathy, lumbar region: Secondary | ICD-10-CM | POA: Diagnosis not present

## 2018-01-16 DIAGNOSIS — M5442 Lumbago with sciatica, left side: Secondary | ICD-10-CM

## 2018-01-22 ENCOUNTER — Ambulatory Visit: Payer: Medicare Other | Admitting: Family Medicine

## 2018-01-22 ENCOUNTER — Ambulatory Visit: Payer: Medicare Other

## 2018-01-25 DIAGNOSIS — M5136 Other intervertebral disc degeneration, lumbar region: Secondary | ICD-10-CM | POA: Diagnosis not present

## 2018-01-25 DIAGNOSIS — M5416 Radiculopathy, lumbar region: Secondary | ICD-10-CM | POA: Diagnosis not present

## 2018-02-21 ENCOUNTER — Other Ambulatory Visit: Payer: Self-pay | Admitting: Family Medicine

## 2018-02-21 ENCOUNTER — Other Ambulatory Visit: Payer: Self-pay | Admitting: Cardiovascular Disease

## 2018-02-21 DIAGNOSIS — E034 Atrophy of thyroid (acquired): Secondary | ICD-10-CM

## 2018-02-21 DIAGNOSIS — M5416 Radiculopathy, lumbar region: Secondary | ICD-10-CM | POA: Diagnosis not present

## 2018-02-21 DIAGNOSIS — M5136 Other intervertebral disc degeneration, lumbar region: Secondary | ICD-10-CM | POA: Diagnosis not present

## 2018-03-16 ENCOUNTER — Other Ambulatory Visit: Payer: Self-pay | Admitting: Family Medicine

## 2018-03-16 DIAGNOSIS — E119 Type 2 diabetes mellitus without complications: Secondary | ICD-10-CM

## 2018-04-01 ENCOUNTER — Ambulatory Visit (INDEPENDENT_AMBULATORY_CARE_PROVIDER_SITE_OTHER): Payer: Medicare Other | Admitting: Family Medicine

## 2018-04-01 ENCOUNTER — Encounter: Payer: Self-pay | Admitting: Family Medicine

## 2018-04-01 ENCOUNTER — Other Ambulatory Visit: Payer: Self-pay | Admitting: Family Medicine

## 2018-04-01 ENCOUNTER — Telehealth: Payer: Self-pay | Admitting: Family Medicine

## 2018-04-01 VITALS — BP 131/63 | HR 56 | Temp 98.2°F | Resp 16 | Ht 71.0 in | Wt 219.0 lb

## 2018-04-01 DIAGNOSIS — E1142 Type 2 diabetes mellitus with diabetic polyneuropathy: Secondary | ICD-10-CM | POA: Diagnosis not present

## 2018-04-01 DIAGNOSIS — E034 Atrophy of thyroid (acquired): Secondary | ICD-10-CM

## 2018-04-01 DIAGNOSIS — Z7901 Long term (current) use of anticoagulants: Secondary | ICD-10-CM

## 2018-04-01 DIAGNOSIS — I25810 Atherosclerosis of coronary artery bypass graft(s) without angina pectoris: Secondary | ICD-10-CM

## 2018-04-01 DIAGNOSIS — N182 Chronic kidney disease, stage 2 (mild): Secondary | ICD-10-CM

## 2018-04-01 DIAGNOSIS — J449 Chronic obstructive pulmonary disease, unspecified: Secondary | ICD-10-CM

## 2018-04-01 DIAGNOSIS — Z0000 Encounter for general adult medical examination without abnormal findings: Secondary | ICD-10-CM

## 2018-04-01 DIAGNOSIS — N400 Enlarged prostate without lower urinary tract symptoms: Secondary | ICD-10-CM

## 2018-04-01 DIAGNOSIS — E7800 Pure hypercholesterolemia, unspecified: Secondary | ICD-10-CM

## 2018-04-01 DIAGNOSIS — I10 Essential (primary) hypertension: Secondary | ICD-10-CM

## 2018-04-01 LAB — POCT UA - MICROALBUMIN: MICROALBUMIN (UR) POC: 50 mg/L

## 2018-04-01 LAB — POCT GLYCOSYLATED HEMOGLOBIN (HGB A1C): HEMOGLOBIN A1C: 8.6 % — AB (ref 4.0–5.6)

## 2018-04-01 NOTE — Telephone Encounter (Signed)
Patient was seen today Monday 04/01/18 - he was asked to check on this.  I attempted to call him back and did not reach him at this #, and no voicemail set up.  If you can re-attempt to call him tomorrow or later this week.  If he has not been taking Januvia, he may remain off of it for now. No new rx at this time. We will re-check his lab results in February 2020.  If he needs new medicine at that time we can restart Januvia.  [name], DO [facility] 04/01/2018, 5:38 PM

## 2018-04-01 NOTE — Assessment & Plan Note (Signed)
Significant improvement in A1c from 9.3 down to 8.6, with improved lifestyle and supposedly back on Januvia, however there is question if he is actually taking this again or not, he needs to check his pill box - Still elevation from prednisone burst in setting of COPD and also chronic low dose prednisone now at lower regimen Complications - CKD-III, peripheral neuropathy, cataracts Remains OFF GLP1 Bydureon (allergy), and Lantus insulin  Plan:  1. CONTINUE Januvia 100mg  daily (check home meds - if does not have or never restarted, may hold off for now, or if request we can re-send) - Continue Metformin 1000mg  BID (500mg  x 2 BID) 2. Encourage improved lifestyle - low carb, low sugar diet, reduce portion size, continue improving regular exercise 3. Check CBG, bring log to next visit for review 4. Continue ARB, Statin 5. Follow-up 3 months yearly with A1c

## 2018-04-01 NOTE — Progress Notes (Addendum)
Subjective:    Patient ID: Philip [name]., male    DOB: 1946/03/21, 72 y.o.   MRN: [id number]  Philip RYLEE Sr. is a 72 y.o. male presenting on 04/01/2018 for Diabetes  Patient accompanied by wife, Philip [name], as well for additional history.  HPI   CHRONIC DM, Type 2: Last visit 11/2017 for diabetes - Interval update has not had flare up again of olecranon bursitis. Instead he still has issues with COPD - followed by [facility] Philip [name], reduced his prednisone from alternating 5 to 10mg  every other day, down to Prednisone 5mg  daily - however 2-3 weeks ago he required a higher dose prednisone burst / taper for acute breathing COPD but he overall had been doing better. - CBG readings recently avg 170-200, rarely "high reading" on prednisone Today elevated A1c >9 Meds: Januvia 100mg  daily (restarted last visit - but he does not recall specifically), also on Metformin 1000mg  twice daily (500mg  tabs) - OFF Bydureon due to possible allergy. Remains off Lantus insulin Reports good compliance. Tolerating well w/o side-effects Currently on ARB, on Statin Lifestyle: - Diet:significantly improved DM diet - reduced portions and lower carb content - Exercise: Walking at work, no other changes - Complicated by peripheral neuropathy and CKD-III Last DM Eye in 06/2017 at [facility] Denies hypoglycemia  Health Maintenance: UTD  Depression screen [facility] 2/9 04/01/2018 12/25/2017 12/25/2017  Decreased Interest 0 0 0  Down, Depressed, Hopeless 0 0 0  PHQ - 2 Score 0 0 0  Altered sleeping - - -  Tired, decreased energy - - -  Change in appetite - - -  Feeling bad or failure about yourself  - - -  Trouble concentrating - - -  Moving slowly or fidgety/restless - - -  Suicidal thoughts - - -  PHQ-9 Score - - -  Difficult doing work/chores - - -    Social History   Tobacco Use  . Smoking status: Former Smoker    Packs/day: 1.00    Years: 8.00    Pack years: 8.00    Types:  Cigarettes    Last attempt to quit: 12/31/1967    Years since quitting: 50.2  . Smokeless tobacco: Never Used  Substance Use Topics  . Alcohol use: No    Alcohol/week: 0.0 standard drinks    Comment: 05/06/2012 "last alcohol several years ago; never had problem wit"  . Drug use: No    Review of Systems Per HPI unless specifically indicated above     Objective:    BP 131/63   Pulse (!) 56   Temp 98.2 F (36.8 C) (Oral)   Resp 16   Ht 5\' 11"  (1.803 m)   Wt 219 lb (99.3 kg)   BMI 30.54 kg/m   Wt Readings from Last 3 Encounters:  04/01/18 219 lb (99.3 kg)  12/25/17 216 lb 6.4 oz (98.2 kg)  12/25/17 216 lb 6.4 oz (98.2 kg)    Physical Exam  Constitutional: He is oriented to person, place, and time. He appears well-developed and well-nourished. No distress.  Well-appearing, comfortable, cooperative  HENT:  Head: Normocephalic and atraumatic.  Mouth/Throat: Oropharynx is clear and moist.  Eyes: Conjunctivae are normal. Right eye exhibits no discharge. Left eye exhibits no discharge.  Cardiovascular: Normal rate and intact distal pulses.  Pulmonary/Chest: Effort normal and breath sounds normal. No respiratory distress. He has no wheezes. He has no rales.  Musculoskeletal: He exhibits no edema.  Neurological: He is alert and oriented  to person, place, and time.  Skin: Skin is warm and dry. No rash noted. He is not diaphoretic. No erythema.  Psychiatric: He has a normal mood and affect. His behavior is normal.  Well groomed, good eye contact, normal speech and thoughts  Nursing note and vitals reviewed.  Results for orders placed or performed in visit on 04/01/18  POCT HgB A1C  Result Value Ref Range   Hemoglobin A1C 8.6 (A) 4.0 - 5.6 %  POCT UA - Microalbumin  Result Value Ref Range   Microalbumin Ur, POC 50 mg/L   Recent Labs    09/18/17 1617 12/25/17 1451 04/01/18 1055  HGBA1C 7.2* 9.3* 8.6*      Assessment & Plan:   Problem List Items Addressed This Visit     Type 2 diabetes mellitus with peripheral neuropathy (HCC) - Primary    Significant improvement in A1c from 9.3 down to 8.6, with improved lifestyle and supposedly back on Januvia, however there is question if he is actually taking this again or not, he needs to check his pill box - Still elevation from prednisone burst in setting of COPD and also chronic low dose prednisone now at lower regimen Complications - CKD-III, peripheral neuropathy, cataracts Remains OFF GLP1 Bydureon (allergy), and Lantus insulin  Plan:  1. CONTINUE Januvia 100mg  daily (check home meds - if does not have or never restarted, may hold off for now, or if request we can re-send) - Continue Metformin 1000mg  BID (500mg  x 2 BID) 2. Encourage improved lifestyle - low carb, low sugar diet, reduce portion size, continue improving regular exercise 3. Check CBG, bring log to next visit for review 4. Continue ARB, Statin 5. Follow-up 3 months yearly with A1c      Relevant Medications   atorvastatin (LIPITOR) 40 MG tablet   losartan (COZAAR) 25 MG tablet   Other Relevant Orders   POCT HgB A1C (Completed)   POCT UA - Microalbumin (Completed)      No orders of the defined types were placed in this encounter.   Follow up plan: Return in about 3 months (around 07/01/2018) for Annual Physical.  Future labs ordered for 06/24/18  [name], [facility] 04/01/2018, 10:53 AM  **UPDATE - after visit he confirmed with a callback that he is NOT taking Januvia. I attempted to reach him - did not but our staff will contact him later this week. He may remain OFF Januvia for now, and follow-up as planned in February 2020. If needed at that time, if increased A1c we may restart Januvia** - see telephone note today

## 2018-04-01 NOTE — Patient Instructions (Addendum)
Thank you for coming to the office today.  Recent Labs    09/18/17 1617 12/25/17 1451 04/01/18 1055  HGBA1C 7.2* 9.3* 8.6*   Check Diabetes med at home - you should be taking Sitagliptin (Januvia) 100mg  daily - if not or do not have this med, let me know and we can consider it in the future. If you have it, then keep taking it.  Keep improving lifestyle  Goal to limit prednisone  DUE for FASTING BLOOD WORK (no food or drink after midnight before the lab appointment, only water or coffee without cream/sugar on the morning of)  SCHEDULE "Lab Only" visit in the morning at the clinic for lab draw in 3 MONTHS   - Make sure Lab Only appointment is at about 1 week before your next appointment, so that results will be available  For Lab Results, once available within 2-3 days of blood draw, you can can log in to MyChart online to view your results and a brief explanation. Also, we can discuss results at next follow-up visit.   Please schedule a Follow-up Appointment to: Return in about 3 months (around 07/01/2018) for Annual Physical.  If you have any other questions or concerns, please feel free to call the office or send a message through [place]. You may also schedule an earlier appointment if necessary.  Additionally, you may be receiving a survey about your experience at our office within a few days to 1 week by e-mail or mail. We value your feedback.  [name], DO [facility]

## 2018-04-01 NOTE — Telephone Encounter (Signed)
Pt is not taking [place].  His call back [phone]

## 2018-04-02 NOTE — Telephone Encounter (Signed)
As per patient he ran out in 09/2017 last Rx picked up was in 04/19. He was advised as per Dr. Raliegh Ip and wait until 02/19 for test result.

## 2018-04-11 ENCOUNTER — Other Ambulatory Visit: Payer: Self-pay

## 2018-04-11 NOTE — Patient Outreach (Signed)
[facility]) Care Management  04/11/2018  Philip VELEZ Sr. 1945-10-02 [id number]   Medication Adherence call to [name] spoke with patient he is due on Metformin 500 mg he still has medication for about one more week patient said he will order in a week patient did not want to refill it now.[name] is showing past due under [place].   [facility] Direct Dial [phone]  Fax [phone] [email or web address]

## 2018-05-13 DIAGNOSIS — J432 Centrilobular emphysema: Secondary | ICD-10-CM | POA: Diagnosis not present

## 2018-05-13 DIAGNOSIS — R0609 Other forms of dyspnea: Secondary | ICD-10-CM | POA: Diagnosis not present

## 2018-05-13 DIAGNOSIS — J449 Chronic obstructive pulmonary disease, unspecified: Secondary | ICD-10-CM | POA: Diagnosis not present

## 2018-05-13 DIAGNOSIS — G4733 Obstructive sleep apnea (adult) (pediatric): Secondary | ICD-10-CM | POA: Diagnosis not present

## 2018-06-20 ENCOUNTER — Other Ambulatory Visit: Payer: Self-pay | Admitting: Cardiovascular Disease

## 2018-06-21 ENCOUNTER — Other Ambulatory Visit: Payer: Self-pay

## 2018-06-21 DIAGNOSIS — Z7901 Long term (current) use of anticoagulants: Secondary | ICD-10-CM

## 2018-06-21 DIAGNOSIS — N400 Enlarged prostate without lower urinary tract symptoms: Secondary | ICD-10-CM

## 2018-06-21 DIAGNOSIS — I25810 Atherosclerosis of coronary artery bypass graft(s) without angina pectoris: Secondary | ICD-10-CM

## 2018-06-21 DIAGNOSIS — E1142 Type 2 diabetes mellitus with diabetic polyneuropathy: Secondary | ICD-10-CM

## 2018-06-21 DIAGNOSIS — E7800 Pure hypercholesterolemia, unspecified: Secondary | ICD-10-CM

## 2018-06-21 DIAGNOSIS — E034 Atrophy of thyroid (acquired): Secondary | ICD-10-CM

## 2018-06-21 DIAGNOSIS — I10 Essential (primary) hypertension: Secondary | ICD-10-CM

## 2018-06-21 DIAGNOSIS — N182 Chronic kidney disease, stage 2 (mild): Secondary | ICD-10-CM

## 2018-06-21 DIAGNOSIS — Z0000 Encounter for general adult medical examination without abnormal findings: Secondary | ICD-10-CM

## 2018-06-24 ENCOUNTER — Other Ambulatory Visit: Payer: Medicare Other

## 2018-06-24 DIAGNOSIS — I10 Essential (primary) hypertension: Secondary | ICD-10-CM | POA: Diagnosis not present

## 2018-06-24 DIAGNOSIS — E1142 Type 2 diabetes mellitus with diabetic polyneuropathy: Secondary | ICD-10-CM | POA: Diagnosis not present

## 2018-06-24 DIAGNOSIS — I25810 Atherosclerosis of coronary artery bypass graft(s) without angina pectoris: Secondary | ICD-10-CM | POA: Diagnosis not present

## 2018-06-24 DIAGNOSIS — E034 Atrophy of thyroid (acquired): Secondary | ICD-10-CM | POA: Diagnosis not present

## 2018-06-24 DIAGNOSIS — E7800 Pure hypercholesterolemia, unspecified: Secondary | ICD-10-CM | POA: Diagnosis not present

## 2018-06-25 LAB — COMPLETE METABOLIC PANEL WITH GFR
AG RATIO: 2.4 (calc) (ref 1.0–2.5)
ALT: 14 U/L (ref 9–46)
AST: 14 U/L (ref 10–35)
Albumin: 3.8 g/dL (ref 3.6–5.1)
Alkaline phosphatase (APISO): 92 U/L (ref 35–144)
BUN: 15 mg/dL (ref 7–25)
CO2: 28 mmol/L (ref 20–32)
Calcium: 9.2 mg/dL (ref 8.6–10.3)
Chloride: 109 mmol/L (ref 98–110)
Creat: 1.02 mg/dL (ref 0.70–1.18)
GFR, Est African American: 85 mL/min/{1.73_m2} (ref >=60)
GFR, Est Non African American: 74 mL/min/{1.73_m2} (ref >=60)
Globulin: 1.6 g/dL (calc) — ABNORMAL LOW (ref 1.9–3.7)
Glucose, Bld: 127 mg/dL — ABNORMAL HIGH (ref 65–99)
Potassium: 4.3 mmol/L (ref 3.5–5.3)
Sodium: 144 mmol/L (ref 135–146)
Total Bilirubin: 0.5 mg/dL (ref 0.2–1.2)
Total Protein: 5.4 g/dL — ABNORMAL LOW (ref 6.1–8.1)

## 2018-06-25 LAB — PSA: PSA: 1 ng/mL (ref <=4.0)

## 2018-06-25 LAB — CBC WITH DIFFERENTIAL/PLATELET
Absolute Monocytes: 645 cells/uL (ref 200–950)
Basophils Absolute: 81 cells/uL (ref 0–200)
Basophils Relative: 1.3 %
Eosinophils Absolute: 502 cells/uL — ABNORMAL HIGH (ref 15–500)
Eosinophils Relative: 8.1 %
HCT: 41.4 % (ref 38.5–50.0)
Hemoglobin: 13.9 g/dL (ref 13.2–17.1)
LYMPHS ABS: 1147 {cells}/uL (ref 850–3900)
MCH: 29 pg (ref 27.0–33.0)
MCHC: 33.6 g/dL (ref 32.0–36.0)
MCV: 86.3 fL (ref 80.0–100.0)
MPV: 11.3 fL (ref 7.5–12.5)
Monocytes Relative: 10.4 %
Neutro Abs: 3825 cells/uL (ref 1500–7800)
Neutrophils Relative %: 61.7 %
Platelets: 195 10*3/uL (ref 140–400)
RBC: 4.8 10*6/uL (ref 4.20–5.80)
RDW: 13.7 % (ref 11.0–15.0)
Total Lymphocyte: 18.5 %
WBC: 6.2 10*3/uL (ref 3.8–10.8)

## 2018-06-25 LAB — LIPID PANEL
Cholesterol: 125 mg/dL (ref <200)
HDL: 46 mg/dL (ref >=40)
LDL Cholesterol (Calc): 61 mg/dL (calc)
Non-HDL Cholesterol (Calc): 79 mg/dL (calc) (ref <130)
Total CHOL/HDL Ratio: 2.7 (calc) (ref <5.0)
Triglycerides: 97 mg/dL (ref <150)

## 2018-06-25 LAB — HEMOGLOBIN A1C
Hgb A1c MFr Bld: 8.1 % of total Hgb — ABNORMAL HIGH (ref <5.7)
Mean Plasma Glucose: 186 (calc)
eAG (mmol/L): 10.3 (calc)

## 2018-06-25 LAB — T4, FREE: Free T4: 1.4 ng/dL (ref 0.8–1.8)

## 2018-06-25 LAB — TSH: TSH: 3.58 mIU/L (ref 0.40–4.50)

## 2018-07-01 ENCOUNTER — Ambulatory Visit (INDEPENDENT_AMBULATORY_CARE_PROVIDER_SITE_OTHER): Payer: Medicare Other | Admitting: Family Medicine

## 2018-07-01 ENCOUNTER — Other Ambulatory Visit: Payer: Self-pay

## 2018-07-01 ENCOUNTER — Encounter: Payer: Self-pay | Admitting: Family Medicine

## 2018-07-01 VITALS — BP 101/54 | HR 65 | Temp 98.6°F | Resp 16 | Ht 71.0 in | Wt 210.4 lb

## 2018-07-01 DIAGNOSIS — J432 Centrilobular emphysema: Secondary | ICD-10-CM

## 2018-07-01 DIAGNOSIS — N182 Chronic kidney disease, stage 2 (mild): Secondary | ICD-10-CM

## 2018-07-01 DIAGNOSIS — E1169 Type 2 diabetes mellitus with other specified complication: Secondary | ICD-10-CM

## 2018-07-01 DIAGNOSIS — E663 Overweight: Secondary | ICD-10-CM

## 2018-07-01 DIAGNOSIS — R05 Cough: Secondary | ICD-10-CM | POA: Diagnosis not present

## 2018-07-01 DIAGNOSIS — I129 Hypertensive chronic kidney disease with stage 1 through stage 4 chronic kidney disease, or unspecified chronic kidney disease: Secondary | ICD-10-CM

## 2018-07-01 DIAGNOSIS — I5032 Chronic diastolic (congestive) heart failure: Secondary | ICD-10-CM | POA: Diagnosis not present

## 2018-07-01 DIAGNOSIS — E785 Hyperlipidemia, unspecified: Secondary | ICD-10-CM

## 2018-07-01 DIAGNOSIS — R0609 Other forms of dyspnea: Secondary | ICD-10-CM | POA: Diagnosis not present

## 2018-07-01 DIAGNOSIS — Z0000 Encounter for general adult medical examination without abnormal findings: Secondary | ICD-10-CM | POA: Diagnosis not present

## 2018-07-01 DIAGNOSIS — E1142 Type 2 diabetes mellitus with diabetic polyneuropathy: Secondary | ICD-10-CM

## 2018-07-01 DIAGNOSIS — I480 Paroxysmal atrial fibrillation: Secondary | ICD-10-CM

## 2018-07-01 DIAGNOSIS — E034 Atrophy of thyroid (acquired): Secondary | ICD-10-CM

## 2018-07-01 DIAGNOSIS — G4733 Obstructive sleep apnea (adult) (pediatric): Secondary | ICD-10-CM

## 2018-07-01 NOTE — Assessment & Plan Note (Signed)
Well-controlled HTN Complication with CKD-II - now improved Creatinine and GFR    Plan:  1. Continue current BP regimen - Losartan 100mg  daily 2. Encourage improved lifestyle - low sodium diet, regular exercise 3. Continue monitor BP outside office, bring readings to next visit, if persistently >140/90 or new symptoms notify office sooner

## 2018-07-01 NOTE — Patient Instructions (Addendum)
Thank you for coming to the office today.  Keep up the good work overall. Double check and make sure you are OFF the Januvia (or generic Sitagliptin)  - Keep on lifestyle improvement and Metformin only for now - in future we can reconsider Januvia, or the newer medications that you urinate out sugar - Jardiance, Farxiga, Invokana, or a cousin of Bydureon which is an injectable once a week. Call your insurance to check cost and coverage if you want - can check Januvia as well.  1. Chemistry - Improved kidney function. Mild elevated fasting sugar. Slightly low protein.  2. Hemoglobin A1c (Diabetes) - 8.1, improved from past 8.6 to 9.3  3. PSA Prostate Cancer Screening - 1.0, negative.  4. TSH Thyroid Function Tests - Normal. Controlled on Levothyroxine 29mcg daily  5. Cholesterol - Normal. Controlled on Atorvastatin.  6. CBC Blood Counts - Normal, no anemia, other abnormality   Please schedule a Follow-up Appointment to: Return in about 6 months (around 01/01/2019) for DM A1c month.  If you have any other questions or concerns, please feel free to call the office or send a message through [place]. You may also schedule an earlier appointment if necessary.  Additionally, you may be receiving a survey about your experience at our office within a few days to 1 week by e-mail or mail. We value your feedback.  [name], DO [facility]

## 2018-07-01 NOTE — Assessment & Plan Note (Signed)
Controlled TSH thyroid panel Continue on Levothyroxine 58mcg daily

## 2018-07-01 NOTE — Assessment & Plan Note (Signed)
Recent mild AECOPD, on medication currently Followed by [facility] Dr Raul Del Now back on chronic prednisone 5mg  daily On current COPD maintenance, Symbicort Follow with Pulm as needed

## 2018-07-01 NOTE — Assessment & Plan Note (Signed)
Improved CKD from Stage 3 to 2, based on GFR with improved Cr Secondary to HTN, DM2, Age possibly NSAID Continue ARB Reassurance today Follow-up Cr monitor

## 2018-07-01 NOTE — Assessment & Plan Note (Signed)
Weight loss Encouraged improved lifestyle diet exercise

## 2018-07-01 NOTE — Assessment & Plan Note (Signed)
Stable, without worsening or complication. Currently sinus without AFib On chronic anticoagulation Eliquis Followed by [facility], also Dr Rayann Heman EP Remains off Amiodarone due to risk with pulm

## 2018-07-01 NOTE — Assessment & Plan Note (Signed)
Off CPAP now due to difficulty with positioning and COPD, has done well off CPAP by his report

## 2018-07-01 NOTE — Assessment & Plan Note (Signed)
Improved A1c 8.1 from 8.6 - only on metformin and improved lifestyle - No hypoglycemia - Concer chronic low dose prednisone now at lower regimen - COPD Complications - 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 Continue Metformin 1000mg  BID (500mg  x 2 BID) 2. Encourage improved lifestyle - low carb, low sugar diet, reduce portion size, continue improving regular exercise 3. Check CBG, bring log to next visit for review 4. Continue ARB, Statin 5. Follow-up 6 months DM A1c  Discussed next options if A1c climbs up again, then we would offer Januvia restart vs possible SGLT2 med option - if his kidney function remains improved CKD-II only - prefer to avoid GLP1 due to prior suspected allergic reaction to [facility]

## 2018-07-01 NOTE — Assessment & Plan Note (Signed)
Stable, without worsening. Euvolemic Followed by [facility]

## 2018-07-01 NOTE — Progress Notes (Signed)
Subjective:    Patient ID: Philip Husbands., male    DOB: 31-Dec-1945, 73 y.o.   MRN: [id number]  Philip SALWAY Sr. is a 73 y.o. male presenting on 07/01/2018 for Annual Exam   HPI   Here for Annual Physical and Lab Review.  CHRONIC DM, Type 2: Last visit 03/2018 for Diabetes. He was continued on Metformin and asked to check on status of Januvia rx, he is not taking Januvia, and has dramatically improved his diet / lifestyle, he has managed to lose weight, sugar improved, other interval update now back on low dose chronic prednisone from pulm - CBG readings improved < 170 avg Meds: Metformin 1053m twice daily (5067mtabs) - OFF Januvia - OFF Bydureon due to possible allergy. Remains off Lantus insulin Reports good compliance. 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 - Scheduled DM Eye Exam in March 2020 [place] hypogly[place]  Hypothyroidism  Last lab normalized TSH and thyroid function. Taking Levothyroxine 883m tolerating well  OSA, off CPAP He cannot wear it due to breathing and his position. He has failed this therapy. No longer on it in while.  HYPERLIPIDEMIA / Overweight BMI >29 - Reports no concerns. Last lipid panel 06/2018, stable and controlled  - Currently taking Atorvastatin 69m36molerating well without side effects or myalgias  HTN / Paroxysmal Atrial Fibrillation with history of Atrial tachycardia / CAD s/p CABG without angina - [facility] - not indicated for ablation as of prior apt, in 2019. - Reports doing well, without anginal symptoms  Centrilobular Emphysema Followed by Dr [facility] seen  earlier today 07/01/18, he was treated with Azithromycin Zpak and Prednisone with future chronic low dose prednisone 5mg 77mly again, and on Symbicort maintenance therapy. See note from Pulm.[facility] Maintenance: UTD Vaccines, PNA, Flu, TDap UTD history of Hep C screening  Depression screen [facility] 04/01/2018 12/25/2017  Decreased Interest 0 0 0  Down, Depressed, Hopeless 0 0 0  PHQ - 2 Score 0 0 0  Altered sleeping - - -  Tired, decreased energy - - -  Change in appetite - - -  Feeling bad or failure about yourself  - - -  Trouble concentrating - - -  Moving slowly or fidgety/restless - - -  Suicidal thoughts - - -  PHQ-9 Score - - -  Difficult doing work/chores - - -    Past Medical History:  Diagnosis Date  . Arthritis    a. right knee  . Asthma   . Atrial arrhythmia    salvos of nonsustained atach  . 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 etiology.  . Coronary artery disease    a. 05/2012 Cath: severe 3VD-->CABG by Dr. GerhaServando Snare/2014 with LIMA to LAD, SVG to LCX & SVG to RPDA.  . DysMarland Kitchenhythmia    AFIB  . Edema    LEGS/ FEET  . History of bronchitis   . HOH (hard of hearing)   . Hypercholesteremia   . Neuropathy   . Shingles   . Sleep apnea    NO CPAP   Past Surgical History:  Procedure  Laterality Date  . ANTERIOR CERVICAL DECOMP/DISCECTOMY FUSION  ~ 2009  . BACK SURGERY     CERVICAL FUSION  . CARDIAC CATHETERIZATION  05/06/2012   Significant three-vessel coronary artery disease with normal ejection fraction  . CATARACT EXTRACTION W/PHACO Right 08/15/2017   Procedure: CATARACT EXTRACTION PHACO AND INTRAOCULAR LENS PLACEMENT (IOC);  Surgeon: Birder Robson, MD;  Location: [facility];  Service: Ophthalmology;  Laterality: Right;  [place] 00:23 AP% 13.3 CDE 3.11 Fluid pack lot # [id number]  . CATARACT EXTRACTION W/PHACO Left 10/03/2017   Procedure: CATARACT EXTRACTION PHACO AND INTRAOCULAR LENS PLACEMENT (IOC);  Surgeon: Birder Robson, MD;  Location: [facility];   Service: Ophthalmology;  Laterality: Left;  Lot #[id number] H [place]: 00:21.2 AP%:13.9 CDE: 2.96  . CORONARY ANGIOPLASTY    . CORONARY ARTERY BYPASS GRAFT  05/07/2012   Procedure: CORONARY ARTERY BYPASS GRAFTING (CABG);  Surgeon: [name], MD;  Location: [place];  Service: Open Heart Surgery;  Laterality: N/A;  times three  . INTRAOPERATIVE TRANSESOPHAGEAL ECHOCARDIOGRAM  05/07/2012   Procedure: INTRAOPERATIVE TRANSESOPHAGEAL ECHOCARDIOGRAM;  Surgeon: [name], MD;  Location: [place];  Service: Open Heart Surgery;  Laterality: N/A;  . KNEE ARTHROSCOPY  ~ 2000   "right" (05/06/2012)  . KNEE SURGERY  2003   right  . NECK SURGERY  2008   Plate in neck   Social History   Socioeconomic History  . Marital status: Married    Spouse name: Not on file  . Number of children: Not on file  . Years of education: Not on file  . Highest education level: Some college, no degree  Occupational History  . Not on file  Social Needs  . Financial resource strain: Not hard at all  . Food insecurity:    Worry: Never true    Inability: Never true  . Transportation needs:    Medical: No    Non-medical: No  Tobacco Use  . Smoking status: Former Smoker    Packs/day: 1.00    Years: 8.00    Pack years: 8.00    Types: Cigarettes    Last attempt to quit: 12/31/1967    Years since quitting: 50.5  . Smokeless tobacco: Former Network engineer and Sexual Activity  . Alcohol use: No    Alcohol/week: 0.0 standard drinks    Comment: 05/06/2012 "last alcohol several years ago; never had problem wit"  . Drug use: No  . Sexual activity: Not Currently  Lifestyle  . Physical activity:    Days per week: 0 days    Minutes per session: 0 min  . Stress: Not at all  Relationships  . Social connections:    Talks on phone: More than three times a week    Gets together: More than three times a week    Attends religious service: More than 4 times per year    Active member of club or organization: No    Attends  meetings of clubs or organizations: Never    Relationship status: Married  . Intimate partner violence:    Fear of current or ex partner: No    Emotionally abused: No    Physically abused: No    Forced sexual activity: No  Other Topics Concern  . Not on file  Social History Narrative   ** Merged History Encounter **          Working part time    Family History  Problem Relation Age of Onset  . Heart disease Mother   .  Clotting disorder Mother   . Hypertension Sister    Current Outpatient Medications on File Prior to Visit  Medication Sig  . albuterol (PROVENTIL) (2.5 MG/3ML) 0.083% nebulizer solution Inhale 3 mLs into the lungs every 6 (six) hours as needed for wheezing or shortness of breath.   . Albuterol Sulfate 108 (90 Base) MCG/ACT AEPB Inhale 2 puffs into the lungs every 6 (six) hours as needed (shortness of breath).   . Artificial Tear Ointment (DRY EYES OP) Place 1 drop into both eyes daily as needed (for dry eyes).  [name] atorvastatin (LIPITOR) 40 MG tablet Take 40 mg by mouth daily.  . diclofenac sodium (VOLTAREN) 1 % GEL Apply 2 g topically 3 (three) times daily as needed (Use on elbow bursitis and knee).  [name] dicyclomine (BENTYL) 10 MG capsule Take 1 capsule (10 mg total) by mouth 4 (four) times daily -  before meals and at bedtime.  [name] ELIQUIS 5 MG TABS tablet TAKE ONE TABLET BY MOUTH TWICE DAILY  . esomeprazole (NEXIUM) 10 MG packet Take by mouth.  . furosemide (LASIX) 40 MG tablet TAKE 1 TABLET BY MOUTH ONCE DAILY  . gabapentin (NEURONTIN) 100 MG capsule Take 1 capsule (100 mg total) by mouth 3 (three) times daily.  [name] glucose blood (ONE TOUCH ULTRA TEST) test strip 1 each by Other route 2 (two) times daily. Use as instructed to check bloods sugar twice a day.  [name] glucose monitoring kit (FREESTYLE) monitoring kit 1 each by Does not apply route as needed for other.  [name] LANCETS ULTRA THIN MISC 1 Device by Does not apply route 4 (four) times daily -  before meals and at bedtime.    [name] levothyroxine (SYNTHROID, LEVOTHROID) 88 MCG tablet TAKE 1 TABLET BY MOUTH ONCE DAILY  . losartan (COZAAR) 25 MG tablet Take 25 mg by mouth daily.  . metFORMIN (GLUCOPHAGE) 500 MG tablet Take 2 tablets (1,000 mg total) by mouth 2 (two) times daily with a meal.  . metoprolol tartrate (LOPRESSOR) 25 MG tablet TAKE 1 TABLET BY MOUTH TWICE DAILY  . montelukast (SINGULAIR) 10 MG tablet Take 10 mg by mouth at bedtime.   . predniSONE (DELTASONE) 5 MG tablet Take 5 mg by mouth daily.  . SYMBICORT 160-4.5 MCG/ACT inhaler Inhale 2 puffs 2 (two) times daily into the lungs.   . Cetirizine HCl 10 MG CAPS Take 1 capsule (10 mg total) by mouth 4 (four) times daily for 5 days.   No current facility-administered medications on file prior to visit.     Review of Systems  Constitutional: Negative for activity change, appetite change, chills, diaphoresis, fatigue and fever.  HENT: Negative for congestion and hearing loss.   Eyes: Negative for visual disturbance.  Respiratory: Positive for cough and shortness of breath. Negative for apnea, choking, chest tightness and wheezing.   Cardiovascular: Negative for chest pain, palpitations and leg swelling.  Gastrointestinal: Negative for abdominal pain, anal bleeding, blood in stool, constipation, diarrhea, nausea and vomiting.  Endocrine: Negative for cold intolerance.  Genitourinary: Negative for decreased urine volume, difficulty urinating, dysuria, frequency, hematuria and urgency.  Musculoskeletal: Negative for arthralgias, back pain and neck pain.  Skin: Negative for rash.  Allergic/Immunologic: Negative for environmental allergies.  Neurological: Negative for dizziness, weakness, light-headedness, numbness and headaches.  Hematological: Negative for adenopathy.  Psychiatric/Behavioral: Negative for behavioral problems, dysphoric mood and sleep disturbance. The patient is not nervous/anxious.    Per HPI unless specifically indicated above       Objective:  BP (!) 101/54   Pulse 65   Temp 98.6 F (37 C) (Oral)   Resp 16   Ht 5' 11" (1.803 m)   Wt 210 lb 6.4 oz (95.4 kg)   SpO2 94%   BMI 29.34 kg/m   Wt Readings from Last 3 Encounters:  07/01/18 210 lb 6.4 oz (95.4 kg)  04/01/18 219 lb (99.3 kg)  12/25/17 216 lb 6.4 oz (98.2 kg)    Physical Exam Vitals signs and nursing note reviewed.  Constitutional:      General: He is not in acute distress.    Appearance: He is well-developed. He is not diaphoretic.     Comments: Well-appearing, comfortable, cooperative  HENT:     Head: Normocephalic and atraumatic.     Comments: Frontal / maxillary sinuses non-tender. Nares patent without congestion  Bilateral TMs clear without erythema, effusion or bulging. Oropharynx clear without erythema, exudates, edema or asymmetry. Eyes:     General:        Right eye: No discharge.        Left eye: No discharge.     Conjunctiva/sclera: Conjunctivae normal.     Pupils: Pupils are equal, round, and reactive to light.  Neck:     Musculoskeletal: Normal range of motion and neck supple.     Thyroid: No thyromegaly.  Cardiovascular:     Rate and Rhythm: Normal rate and regular rhythm.     Heart sounds: Normal heart sounds. No murmur.  Pulmonary:     Effort: Pulmonary effort is normal. No respiratory distress.     Breath sounds: Wheezing present. No rales.     Comments: Mild Reduced air movement diffusely Abdominal:     General: Bowel sounds are normal. There is no distension.     Palpations: Abdomen is soft. There is no mass.     Tenderness: There is no abdominal tenderness.  Musculoskeletal: Normal range of motion.        General: No tenderness.     Comments: Upper / Lower Extremities: - Normal muscle tone, strength bilateral upper extremities 5/5, lower extremities 5/5  Lymphadenopathy:     Cervical: No cervical adenopathy.  Skin:    General: Skin is warm and dry.     Findings: No erythema or rash.  Neurological:     Mental  Status: He is alert and oriented to person, place, and time.     Comments: Distal sensation intact to light touch all extremities  Psychiatric:        Behavior: Behavior normal.     Comments: Well groomed, good eye contact, normal speech and thoughts    Recent Labs    12/25/17 1451 04/01/18 1055 06/24/18 0846  HGBA1C 9.3* 8.6* 8.1*    Results for orders placed or performed in visit on 06/21/18  T4, free  Result Value Ref Range   Free T4 1.4 0.8 - 1.8 ng/dL  TSH  Result Value Ref Range   TSH 3.58 0.40 - 4.50 mIU/L  PSA  Result Value Ref Range   PSA 1.0 < OR = 4.0 ng/mL  Lipid panel  Result Value Ref Range   Cholesterol 125 <200 mg/dL   HDL 46 > OR = 40 mg/dL   Triglycerides 97 <150 mg/dL   LDL Cholesterol (Calc) 61 mg/dL (calc)   Total CHOL/HDL Ratio 2.7 <5.0 (calc)   Non-HDL Cholesterol (Calc) 79 <130 mg/dL (calc)  COMPLETE METABOLIC PANEL WITH GFR  Result Value Ref Range   Glucose, Bld 127 (H) 65 -   99 mg/dL   BUN 15 7 - 25 mg/dL   Creat 1.02 0.70 - 1.18 mg/dL   GFR, Est Non African American 74 > OR = 60 mL/min/1.87m   GFR, Est African American 85 > OR = 60 mL/min/1.721m  BUN/Creatinine Ratio NOT APPLICABLE 6 - 22 (calc)   Sodium 144 135 - 146 mmol/L   Potassium 4.3 3.5 - 5.3 mmol/L   Chloride 109 98 - 110 mmol/L   CO2 28 20 - 32 mmol/L   Calcium 9.2 8.6 - 10.3 mg/dL   Total Protein 5.4 (L) 6.1 - 8.1 g/dL   Albumin 3.8 3.6 - 5.1 g/dL   Globulin 1.6 (L) 1.9 - 3.7 g/dL (calc)   AG Ratio 2.4 1.0 - 2.5 (calc)   Total Bilirubin 0.5 0.2 - 1.2 mg/dL   Alkaline phosphatase (APISO) 92 35 - 144 U/L   AST 14 10 - 35 U/L   ALT 14 9 - 46 U/L  CBC with Differential/Platelet  Result Value Ref Range   WBC 6.2 3.8 - 10.8 Thousand/uL   RBC 4.80 4.20 - 5.80 Million/uL   Hemoglobin 13.9 13.2 - 17.1 g/dL   HCT 41.4 38.5 - 50.0 %   MCV 86.3 80.0 - 100.0 fL   MCH 29.0 27.0 - 33.0 pg   MCHC 33.6 32.0 - 36.0 g/dL   RDW 13.7 11.0 - 15.0 %   Platelets 195 140 - 400 Thousand/uL     MPV 11.3 7.5 - 12.5 fL   Neutro Abs 3,825 1,500 - 7,800 cells/uL   Lymphs Abs 1,147 850 - 3,900 cells/uL   Absolute Monocytes 645 200 - 950 cells/uL   Eosinophils Absolute 502 (H) 15 - 500 cells/uL   Basophils Absolute 81 0 - 200 cells/uL   Neutrophils Relative % 61.7 %   Total Lymphocyte 18.5 %   Monocytes Relative 10.4 %   Eosinophils Relative 8.1 %   Basophils Relative 1.3 %  Hemoglobin A1c  Result Value Ref Range   Hgb A1c MFr Bld 8.1 (H) <5.7 % of total Hgb   Mean Plasma Glucose 186 (calc)   eAG (mmol/L) 10.3 (calc)      Assessment & Plan:   Problem List Items Addressed This Visit    (HFpEF) heart failure with preserved ejection fraction (HCC)    Stable, without worsening. [facility]olemic [facility]      Atrial fibrillation ([facility]   Stable, without worsening or complication. Currently sinus without AFib On chronic anticoagulation Eliquis [facility] also Dr AlRayann HemanP Remains off Amiodarone due to risk with pulm      Benign hypertension with CKD (chronic kidney disease), stage II    Well-controlled HTN Complication with CKD-II - now improved Creatinine and GFR    Plan:  1. Continue current BP regimen - Losartan 10067maily 2. Encourage improved lifestyle - low sodium diet, regular exercise 3. Continue monitor BP outside office, bring readings to next visit, if persistently >140/90 or new symptoms notify office sooner      Centrilobular emphysema (HCCHomer  Recent mild AECOPD, on medication currently [facility] FleRaul Delw back on chronic prednisone 5mg29mily On current COPD maintenance, Symbicort Follow with Pulm as needed      CKD (chronic kidney disease), stage II    Improved CKD from Stage 3 to 2, based on GFR with improved Cr Secondary to HTN, DM2, Age possibly NSAID Continue ARB Reassurance today Follow-up Cr monitor  Hyperlipidemia associated with type 2 diabetes mellitus (HCC)    Controlled  cholesterol on statin and improved lifestyle Last lipid panel 06/2018   Plan: 1. Continue current meds - Atorvastatin 37m daily 2. Encourage improved lifestyle - low carb/cholesterol, reduce portion size, continue improving regular exercise      Hypothyroid    Controlled TSH thyroid panel Continue on Levothyroxine 863m daily      OSA (obstructive sleep apnea)    Off CPAP now due to difficulty with positioning and COPD, has done well off CPAP by his report      Overweight (BMI 25.0-29.9)    Weight loss Encouraged improved lifestyle diet exercise      Type 2 diabetes mellitus with peripheral neuropathy (HCC)    Improved A1c 8.1 from 8.6 - only on metformin and improved lifestyle - No hypoglycemia - Concer chronic low dose prednisone now at lower regimen - COPD Complications - 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 Continue Metformin 100019mID (500m86m2 BID) 2. Encourage improved lifestyle - low carb, low sugar diet, reduce portion size, continue improving regular exercise 3. Check CBG, bring log to next visit for review 4. Continue ARB, Statin 5. Follow-up 6 months DM A1c  Discussed next options if A1c climbs up again, then we would offer Januvia restart vs possible SGLT2 med option - if his kidney function remains improved CKD-II only - prefer to avoid GLP1 due to prior suspected allergic reaction to Bydureon       Other Visit Diagnoses    Annual physical exam    -  Primary      Updated Health Maintenance information Reviewed recent lab results with patient Encouraged improvement to lifestyle with diet and exercise - Goal of weight loss   No orders of the defined types were placed in this encounter.   Follow up plan: Return in about 6 months (around 01/01/2019) for DM A1c month.  [facility]Nobie [facility] 07/01/2018, 2:15  PM

## 2018-07-01 NOTE — Assessment & Plan Note (Signed)
Controlled cholesterol on statin and improved lifestyle Last lipid panel 06/2018   Plan: 1. Continue current meds - Atorvastatin 40mg  daily 2. Encourage improved lifestyle - low carb/cholesterol, reduce portion size, continue improving regular exercise

## 2018-07-08 DIAGNOSIS — Z961 Presence of intraocular lens: Secondary | ICD-10-CM | POA: Diagnosis not present

## 2018-07-08 DIAGNOSIS — E119 Type 2 diabetes mellitus without complications: Secondary | ICD-10-CM | POA: Diagnosis not present

## 2018-07-08 LAB — HM DIABETES EYE EXAM

## 2018-07-13 ENCOUNTER — Other Ambulatory Visit: Payer: Self-pay | Admitting: Cardiovascular Disease

## 2018-07-15 NOTE — Telephone Encounter (Signed)
Refill Request.  

## 2018-09-10 ENCOUNTER — Other Ambulatory Visit: Payer: Self-pay

## 2018-09-10 ENCOUNTER — Telehealth: Payer: Self-pay

## 2018-09-10 ENCOUNTER — Telehealth (INDEPENDENT_AMBULATORY_CARE_PROVIDER_SITE_OTHER): Payer: Medicare Other | Admitting: Physician Assistant

## 2018-09-10 VITALS — BP 140/72 | HR 79 | Ht 71.0 in | Wt 217.0 lb

## 2018-09-10 DIAGNOSIS — I2510 Atherosclerotic heart disease of native coronary artery without angina pectoris: Secondary | ICD-10-CM

## 2018-09-10 DIAGNOSIS — I471 Supraventricular tachycardia: Secondary | ICD-10-CM

## 2018-09-10 DIAGNOSIS — I10 Essential (primary) hypertension: Secondary | ICD-10-CM

## 2018-09-10 DIAGNOSIS — I872 Venous insufficiency (chronic) (peripheral): Secondary | ICD-10-CM

## 2018-09-10 DIAGNOSIS — E785 Hyperlipidemia, unspecified: Secondary | ICD-10-CM

## 2018-09-10 DIAGNOSIS — I480 Paroxysmal atrial fibrillation: Secondary | ICD-10-CM

## 2018-09-10 NOTE — Telephone Encounter (Signed)
Call for e visit appt this afternoon.   CONSENT FOR TELE-HEALTH VISIT - PLEASE REVIEW  I hereby voluntarily request, consent and authorize [facility] and its employed or contracted physicians, physician assistants, nurse practitioners or other licensed health care professionals (the Practitioner), to provide me with telemedicine health care services (the "Services") as deemed necessary by the treating Practitioner. I acknowledge and consent to receive the Services by the Practitioner via telemedicine. I understand that the telemedicine visit will involve communicating with the Practitioner through live audiovisual communication technology and the disclosure of certain medical information by electronic transmission. I acknowledge that I have been given the opportunity to request an in-person assessment or other available alternative prior to the telemedicine visit and am voluntarily participating in the telemedicine visit.  Pt verbally agreed.   I understand that I have the right to withhold or withdraw my consent to the use of telemedicine in the course of my care at any time, without affecting my right to future care or treatment, and that the Practitioner or I may terminate the telemedicine visit at any time. I understand that I have the right to inspect all information obtained and/or recorded in the course of the telemedicine visit and may receive copies of available information for a reasonable fee.  I understand that some of the potential risks of receiving the Services via telemedicine include:  [name] Delay or interruption in medical evaluation due to technological equipment failure or disruption; . Information transmitted may not be sufficient (e.g. poor resolution of images) to allow for appropriate medical decision making by the Practitioner; and/or  . In rare instances, security protocols could fail, causing a breach of personal health information.  Furthermore, I acknowledge that it is my  responsibility to provide information about my medical history, conditions and care that is complete and accurate to the best of my ability. I acknowledge that Practitioner's advice, recommendations, and/or decision may be based on factors not within their control, such as incomplete or inaccurate data provided by me or distortions of diagnostic images or specimens that may result from electronic transmissions. I understand that the practice of medicine is not an exact science and that Practitioner makes no warranties or guarantees regarding treatment outcomes. I acknowledge that I will receive a copy of this consent concurrently upon execution via email to the email address I last provided but may also request a printed copy by calling the office of [facility].    I understand that my insurance will be billed for this visit.   I have read or had this consent read to me. . I understand the contents of this consent, which adequately explains the benefits and risks of the Services being provided via telemedicine.  . I have been provided ample opportunity to ask questions regarding this consent and the Services and have had my questions answered to my satisfaction. . I give my informed consent for the services to be provided through the use of telemedicine in my medical care  By participating in this telemedicine visit I agree to the above.    Pt verbally agrees to consent.

## 2018-09-10 NOTE — Progress Notes (Signed)
Virtual Visit via Telephone Note   This visit type was conducted due to national recommendations for restrictions regarding the COVID-19 Pandemic (e.g. social distancing) in an effort to limit this patient's exposure and mitigate transmission in our community.  Due to his co-morbid illnesses, this patient is at least at moderate risk for complications without adequate follow up.  This format is felt to be most appropriate for this patient at this time.  The patient did not have access to video technology/had technical difficulties with video requiring transitioning to audio format only (telephone).  All issues noted in this document were discussed and addressed.  No physical exam could be performed with this format.  Please refer to the patient's chart for his  consent to telehealth for [facility].   Date:  09/10/2018   ID:  Philip Potters Sr., DOB [birth year], MRN [id number]  Patient Location: Home Provider Location: Home  PCP:  [name], DO  Cardiologist:  [name], MD  Electrophysiologist:  None   Evaluation Performed:  Follow-Up Visit  Chief Complaint:  Follow up CAD  History of Present Illness:    Philip Tsuda. is a 73 y.o. male with history of CAD s/p 3-vessel CABG in 05/2012, PAF on Eliquis, atrial tachycardia with pauses, frequent PACs, COPD, suspected venous insufficiency, asthma, HTN, HLD, and OSA not on CPAP who presents for follow up of his CAD and Afib.   Patient underwent LHC in 05/2012 that showed multivessel CAD as outlined below. Following this, he underwent 3-vessel CABG in 05/2012 with LIMA to LAD, SVG to LCx, and SVG to rPDA. He had postoperative Afib which was treated with amiodarone. Most recent ischemic evaluation from 06/2014 via Myoview that showed no significant ischemia, normal wall motion, EF 53%, no EKG changes concerning for ischemia, low risk study. In 11/2014, he had suspected Afib on EKG and was started on Eliquis. Echo from 06/2015  showed low normal LVSF with an EF of 50-55%, mildly dilated left atrium, and no evidence of pulmonary hypertension. 48-hour Holter from 07/2015 showed NSR with frequent PACs totaling 18,000 in 48 hours representing 12% burden. Lowest heart rate was 43 bpm with overall no pauses greater than 2 seconds. He was seen by EP for his atrial ectopy to consider ablation, though given minimal symptoms, continued medical therapy was advised with discontinuation of amiodarone. He was seen in 06/2017 and noted to be in atrial tachycardia with a heart rate of 115 bpm. He was resumed on Lopressor 25 mg bid with tapering of his losartan to 50 mg daily. He was most recently seen in the office in 09/2017 and had not noted any further palpitations. He had chronic stable dyspnea with occasional left-sided chest pain at rest which was chronic and overall atypical. His BP was soft at 90/60 leading to the tapering of his losartan to 25 mg daily.   Labs: 06/2018 - A1c 8.1, WBC 6.2, HGB 13.9, PLT 195, SCr 1.02, K+ 4.3, AST/ALT normal, LDL 61, TSH normal  He has done well since he was last seen in the office. He denies any chest pain. He does have chronic SOB, though Amelia Jo has actually been improved this year. He has typically attributed his SOB to underlying COPD and allergies this time of year. However, with COVID-19 he has been staying inside more and has not been around as much pollen. He had not had any falls, BRBPR, or melena since he was last seen. His BP typically runs in the  120s/60s. He notes today's reading is mildly elevated, though he had just been carrying something heavy then sat down to check his BP prior to his virtual appointment today. His lower extremity swelling is stable. He denies any abdominal distension, orthopnea, PND, or early satiety. He does not have any issues or concerns at this time.   The patient does not have symptoms concerning for COVID-19 infection (fever, chills, cough, or new shortness of breath).     Past Medical History:  Diagnosis Date   Arthritis    a. 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 etiology.   Coronary artery disease    a. 05/2012 Cath: severe 3VD-->CABG by Dr. Servando Snare in 05/2012 with LIMA to LAD, SVG to LCX & SVG to RPDA.   Dysrhythmia    AFIB   Edema    LEGS/ FEET   History of bronchitis    HOH (hard of hearing)    Hypercholesteremia    Neuropathy    Shingles    Sleep apnea    NO CPAP   Past Surgical History:  Procedure Laterality Date   ANTERIOR CERVICAL DECOMP/DISCECTOMY FUSION  ~ 2009   BACK SURGERY     CERVICAL FUSION   CARDIAC CATHETERIZATION  05/06/2012   Significant three-vessel coronary artery disease with normal ejection fraction   CATARACT EXTRACTION W/PHACO Right 08/15/2017   Procedure: CATARACT EXTRACTION PHACO AND INTRAOCULAR LENS PLACEMENT ([place]);  Surgeon: Birder Robson, MD;  Location: [facility];  Service: Ophthalmology;  Laterality: Right;  [place] 00:23 AP% 13.3 CDE 3.11 Fluid pack lot # [id number]   CATARACT EXTRACTION W/PHACO Left 10/03/2017   Procedure: CATARACT EXTRACTION PHACO AND INTRAOCULAR LENS PLACEMENT ([facility]);  Surgeon: Birder Robson, MD;  Location: [facility];  Service: Ophthalmology;  Laterality: Left;  Lot #[id number] H [place]: 00:21.2 AP%:13.9 CDE: 2.96   CORONARY ANGIOPLASTY     CORONARY ARTERY BYPASS GRAFT  05/07/2012   Procedure: CORONARY ARTERY BYPASS GRAFTING (CABG);  Surgeon: [name], MD;  Location: [place];  Service: Open Heart Surgery;  Laterality: N/A;  times three   INTRAOPERATIVE TRANSESOPHAGEAL ECHOCARDIOGRAM  05/07/2012   Procedure: INTRAOPERATIVE TRANSESOPHAGEAL ECHOCARDIOGRAM;  Surgeon: [name], MD;  Location: [place];  Service: Open Heart Surgery;  Laterality: N/A;   KNEE ARTHROSCOPY  ~ 2000   "right" (05/06/2012)   KNEE SURGERY  2003   right   NECK SURGERY  2008   Plate in neck     Current Meds   Medication Sig   albuterol (PROVENTIL) (2.5 MG/3ML) 0.083% nebulizer solution Inhale 3 mLs into the lungs every 6 (six) hours as needed for wheezing or shortness of breath.    Albuterol Sulfate 108 (90 Base) MCG/ACT AEPB Inhale 2 puffs into the lungs every 6 (six) hours as needed (shortness of breath).    Artificial Tear Ointment (DRY EYES OP) Place 1 drop into both eyes daily as needed (for dry eyes).   atorvastatin (LIPITOR) 40 MG tablet Take 40 mg by mouth daily.   cetirizine (ZYRTEC) 10 MG tablet Take 10 mg by mouth 3 (three) times daily.   diclofenac sodium (VOLTAREN) 1 % GEL Apply 2 g topically 3 (three) times daily as needed (Use on elbow bursitis and knee).   ELIQUIS 5 MG TABS tablet Take 1 tablet by mouth twice daily   furosemide (LASIX) 40 MG tablet TAKE 1 TABLET BY MOUTH  ONCE DAILY   gabapentin (NEURONTIN) 100 MG capsule Take 1 capsule (100 mg total) by mouth 3 (three) times daily.   glucose blood (ONE TOUCH ULTRA TEST) test strip 1 each by Other route 2 (two) times daily. Use as instructed to check bloods sugar twice a day.   glucose monitoring kit (FREESTYLE) monitoring kit 1 each by Does not apply route as needed for other.   LANCETS ULTRA THIN MISC 1 Device by Does not apply route 4 (four) times daily -  before meals and at bedtime.   levothyroxine (SYNTHROID, LEVOTHROID) 88 MCG tablet TAKE 1 TABLET BY MOUTH ONCE DAILY   metFORMIN (GLUCOPHAGE) 500 MG tablet Take 2 tablets (1,000 mg total) by mouth 2 (two) times daily with a meal.   metoprolol tartrate (LOPRESSOR) 25 MG tablet TAKE 1 TABLET BY MOUTH TWICE DAILY   montelukast (SINGULAIR) 10 MG tablet Take 10 mg by mouth at bedtime.    predniSONE (DELTASONE) 5 MG tablet Take 5 mg by mouth daily.   SYMBICORT 160-4.5 MCG/ACT inhaler Inhale 2 puffs 2 (two) times daily into the lungs.      Allergies:   Bydureon [exenatide]; Clindamycin/lincomycin; Penicillins; Sulfa antibiotics; Spiriferis; and Ivp dye [iodinated  diagnostic agents]   Social History   Tobacco Use   Smoking status: Former Smoker    Packs/day: 1.00    Years: 8.00    Pack years: 8.00    Types: Cigarettes    Last attempt to quit: 12/31/1967    Years since quitting: 50.7   Smokeless tobacco: Former Systems developer  Substance Use Topics   Alcohol use: No    Alcohol/week: 0.0 standard drinks    Comment: 05/06/2012 "last alcohol several years ago; never had problem wit"   Drug use: No     Family Hx: The patient's family history includes Clotting disorder in his mother; Heart disease in his mother; Hypertension in his sister.  ROS:   Please see the history of present illness.     All other systems reviewed and are negative.   Prior CV studies:   The following studies were reviewed today:  48-hour Holter 07/2015: Normal sinus rhythm with frequent PACs. A total of 18,000 PACs in 48 hours of presenting 12%. Lowest heart rate was 43 bpm with overall no pauses greater than 2 seconds. __________  2D Echo 06/2015: - Left ventricle: The cavity size was normal. Systolic function was normal. The estimated ejection fraction was in the range of 50% to 55%. Regional wall motion abnormalities cannot be excluded. Doppler parameters are consistent with abnormal left ventricular relaxation (grade 1 diastolic dysfunction). - Mitral valve: There was mild regurgitation. - Left atrium: The atrium was mildly dilated. - Right ventricle: Systolic function was normal. - Pulmonary arteries: Systolic pressure was within the normal   range. __________  Myoview 06/2014: No significant ischemia, EF 53%, no EKG changes concerning for ischemia, low risk study. __________  LHC 05/2012: LM 30%, ostial LAD 99%, mid LAD 40%, proximal LCx 50%, mid LCx 90%, proximal RCA 20%, mid RCA 50%, rPDA 80%  Labs/Other Tests and Data Reviewed:    EKG:  No ECG reviewed.  Recent Labs: 06/24/2018: ALT 14; BUN 15; Creat 1.02; Hemoglobin 13.9; Platelets 195; Potassium 4.3;  Sodium 144; TSH 3.58   Recent Lipid Panel Lab Results  Component Value Date/Time   CHOL 125 06/24/2018 08:46 AM   CHOL 139 12/14/2014 08:34 AM   TRIG 97 06/24/2018 08:46 AM   HDL 46 06/24/2018 08:46 AM  HDL 43 12/14/2014 08:34 AM   CHOLHDL 2.7 06/24/2018 08:46 AM   LDLCALC 61 06/24/2018 08:46 AM    Wt Readings from Last 3 Encounters:  09/10/18 217 lb (98.4 kg)  07/01/18 210 lb 6.4 oz (95.4 kg)  04/01/18 219 lb (99.3 kg)     Objective:    Vital Signs:  BP 140/72 (BP Location: Right Arm, Patient Position: Sitting)    Pulse 79    Ht 5' 11"  (1.803 m)    Wt 217 lb (98.4 kg)    BMI 30.27 kg/m    VITAL SIGNS:  reviewed  ASSESSMENT & PLAN:    1. CAD s/p CABG: He is doing well from a cardiac perspective and is without any symptoms concerning for angina. He is on Eliquis as below in place of ASA. LDL is at goal as below. Continue current medical therapy and secondary prevention. No plans for ischemic evaluation at this time.   2. PAF: He denies any symptoms of palpitations or arrhythmia. He is tolerating anticoagulation with Eliquis without issues. Continue current dose of metoprolol and Eliquis (he does not meet reduced dosing criteria). CHADS2VASc at least 3 (HTN, age x 1, vascular disease). 3. Paroxysmal atrial tachycardia: As above, no symptoms of palpitations. No symptoms of dizziness, presyncope, or syncope with his known pauses as outlined above. Asymptomatic. Continue current medical therapy with metoprolol.   4. HTN: Blood pressure is mildly elevated today, though this was obtained just after carrying something in the house. Typically, his BP runs in the 120s/60s. He will continue to monitor his BP and if this continues to run on the high side he will let us know. For now, no changes in pharmacotherapy.   5. HLD: Most recent LDL of 61 from 06/2018. Goal < 70. Continue Lipitor 40 mg daily.   6. Suspected venous insufficiency: Stable by his report. Continue with leg elevation.    COVID-19 Education: The signs and symptoms of COVID-19 were discussed with the patient and how to seek care for testing (follow up with PCP or arrange E-visit).  The importance of social distancing was discussed today.  Time:   Today, I have spent 10 minutes with the patient with telehealth technology discussing the above problems.     Medication Adjustments/Labs and Tests Ordered: Current medicines are reviewed at length with the patient today.  Concerns regarding medicines are outlined above.   Tests Ordered: No orders of the defined types were placed in this encounter.   Medication Changes: No orders of the defined types were placed in this encounter.   Disposition:  Follow up in 6 month(s)  Signed, Christell Faith, [name]  09/10/2018 2:24 PM    [place]

## 2018-09-10 NOTE — Patient Instructions (Signed)
It was a pleasure to speak with you on the phone today! Thank you for allowing us to continue taking care of your Heartcare needs during this time.   Feel free to call as needed for questions and concerns related to your cardiac needs.   Medication Instructions:  Your physician recommends that you continue on your current medications as directed. Please refer to the Current Medication list given to you today.  If you need a refill on your cardiac medications before your next appointment, please call your pharmacy.   Lab work: None ordered  If you have labs (blood work) drawn today and your tests are completely normal, you will receive your results only by: . MyChart Message (if you have MyChart) OR . A paper copy in the mail If you have any lab test that is abnormal or we need to change your treatment, we will call you to review the results.  Testing/Procedures: None ordered   Follow-Up: At [facility], you and your health needs are our priority.  As part of our continuing mission to provide you with exceptional heart care, we have created designated Provider Care Teams.  These Care Teams include your primary Cardiologist (physician) and Advanced Practice Providers (APPs -  Physician Assistants and Nurse Practitioners) who all work together to provide you with the care you need, when you need it. You will need a follow up appointment in 6 months.  Please call our office 2 months in advance to schedule this appointment.  You may see Muhammad Arida, MD or Ryan Dunn, [name].    

## 2018-09-17 ENCOUNTER — Other Ambulatory Visit: Payer: Self-pay | Admitting: Family Medicine

## 2018-09-17 DIAGNOSIS — E034 Atrophy of thyroid (acquired): Secondary | ICD-10-CM

## 2018-09-24 ENCOUNTER — Ambulatory Visit: Payer: Self-pay | Admitting: Family Medicine

## 2018-09-24 ENCOUNTER — Telehealth: Payer: Self-pay | Admitting: Family Medicine

## 2018-09-24 NOTE — Telephone Encounter (Signed)
@  [facility] Chronic [facility] Management   Outreach Note  09/24/2018 Name: Philip KUBISIAK Sr. MRN: [id number] DOB: 10/01/1945  Referred by: [name], DO Reason for referral : Chronic Care Management (CCM Initial outreach call was unsuccessful. )   An unsuccessful telephone outreach was attempted today. The patient was referred to the case management team by for assistance with chronic care management and care coordination.   Follow Up Plan: The CM team will reach out to the patient again over the next 7 days.  If patient returns call to provider office, please advise to call [place]* at [place]  [email or web address]   ??[phone]

## 2018-09-24 NOTE — Chronic Care Management (AMB) (Signed)
Chronic Care Management   Note  09/24/2018 Name: Philip FIDALGO Sr. MRN: [id number] DOB: 12-10-1945  Philip Potters Sr. is a 73 y.o. year old male who is a primary care patient of [name], DO. I reached out to [place]. by phone today in response to a referral sent by [name]'s health plan.    [name] was given information about Chronic Care Management services today including:  1. CCM service includes personalized support from designated clinical staff supervised by his physician, including individualized plan of care and coordination with other care providers 2. 24/7 contact phone numbers for assistance for urgent and routine care needs. 3. Service will only be billed when office clinical staff spend 20 minutes or more in a month to coordinate care. 4. Only one practitioner may furnish and bill the service in a calendar month. 5. The patient may stop CCM services at any time (effective at the end of the month) by phone call to the office staff. 6. The patient will be responsible for cost sharing (co-pay) of up to 20% of the service fee (after annual deductible is met).  Patient agreed to services and verbal consent obtained.   Follow up plan: Telephone appointment with CCM team member scheduled for: 09/30/2018  [place]  [email or web address]   ??[phone]

## 2018-09-30 ENCOUNTER — Other Ambulatory Visit: Payer: Self-pay | Admitting: Cardiovascular Disease

## 2018-09-30 ENCOUNTER — Ambulatory Visit: Payer: Medicare Other | Admitting: *Deleted

## 2018-09-30 DIAGNOSIS — N182 Chronic kidney disease, stage 2 (mild): Secondary | ICD-10-CM

## 2018-09-30 DIAGNOSIS — E1142 Type 2 diabetes mellitus with diabetic polyneuropathy: Secondary | ICD-10-CM

## 2018-09-30 DIAGNOSIS — J432 Centrilobular emphysema: Secondary | ICD-10-CM

## 2018-09-30 NOTE — Patient Instructions (Signed)
Thank you allowing the Chronic Care Management Team to be a part of your care! It was a pleasure speaking with you today!   CCM (Chronic Care Management) Team   Kivon Aprea RN, BSN Nurse Care Coordinator  [phone]  [name] PharmD  Clinical Pharmacist  [phone]  Eula Fried LCSW Clinical Social Worker [phone]  Goals Addressed            This Visit's Progress   . I would like to work on my COPD (pt-stated)       Current Barriers:  [name] Knowledge deficits related to basic understanding of COPD disease process . Knowledge deficits related to basic COPD self care/management . Knowledge deficit related to basic understanding of how to use inhalers and how inhaled medications work   Case Manager Clinical Goal(s):  Over the next 90 days patient will report using inhalers as prescribed including rinsing mouth after use  Over the next 90 days patient will report utilizing pursed lip breathing for shortness of breath  Over the next 90 days, patient will be able to verbalize understanding of COPD action plan and when to seek appropriate levels of medical care  Over the next 90 days, patient will engage in lite exercise as tolerated to build/regain stamina and strength and reduce shortness of breath through activity tolerance  Over the next 90 days, patient will verbalize basic understanding of COPD disease process and self care activities  Over the next 90 days, patient will not be hospitalized for COPD exacerbation   Interventions:   Provided patient with basic written and verbal COPD education on self care/management/and exacerbation prevention   Provided patient with COPD action plan and reinforced importance of daily self assessment  Discussed Pulmonary Rehab and offered to assist with referral placement  Provided written and verbal instructions on pursed lip breathing and utilized returned demonstration as teach back  Provided instruction about  proper use of medications used for management of COPD including inhalers  Advised patient to self assesses COPD action plan zone and make appointment with provider if in the yellow zone for 48 hours without improvement.   Pharmacy referral for medication review and teaching  Plan to look into obtaining a Flutter Valve  Patient Self Care Activities:  Takes medications as prescribed including inhalers  Engages in light exercise 3-5 days a week   Initial goal documentation        The patient verbalized understanding of instructions provided today and declined a print copy of patient instruction materials.   The care management team will reach out to the patient again over the next 14 days.  The patient has been provided with contact information for the care management team and has been advised to call with any health related questions or concerns.     COPD Action Plan A COPD action plan is a description of what to do when you have a flare (exacerbation) of chronic obstructive pulmonary disease (COPD). Your action plan is a color-coded plan that lists the symptoms that indicate whether or not your condition is under control and what actions to take.  If you have symptoms in the green zone, it means you are doing well that day.  If you have symptoms in the yellow zone, it means you are having a bad day or an exacerbation.  If you have symptoms in the red zone, you need urgent medical care. Follow the plan you and your health care provider developed. Review your plan with your  health care provider at each visit. Red zone Symptoms in this zone mean that you should get medical help right away. They include:  Feeling very short of breath, even when you are resting.  Not being able to do any activities because of poor breathing.  Not being able to sleep because of poor breathing.  Fever or shaking chills.  Feeling confused or very sleepy.  Chest pain.  Coughing up blood. If  you have any of these symptoms, call emergency services (911 in the [place] or go to the nearest emergency room. Yellow zone Symptoms in this zone mean that your condition may be getting worse. They include:  Feeling more short of breath than usual.  Having less energy for daily activities than usual.  Phlegm or mucus that is thicker than usual.  Needing to use your rescue inhaler or nebulizer more often than usual.  More ankle swelling than usual.  Coughing more than usual.  Feeling like you have a chest cold.  Trouble sleeping due to COPD symptoms.  Decreased appetite.  COPD medicines not helping as much as usual. If you experience any "yellow" symptoms:  Keep taking your daily medicines as directed.  Use your quick-relief inhaler as told by your health care provider.  If you were prescribed steroid medicine to take by mouth (oral medicine), start taking it as told by your health care provider.  If you were prescribed an antibiotic, start taking it as told by your health care provider. Do not stop taking the antibiotic even if you start to feel better.  Use oxygen as told by your health care provider.  Get more rest.  Do your pursed-lip breathing exercises.  Do not smoke. Avoid any irritants in the air. If your signs and symptoms do not improve after taking these steps, call your health care provider right away. Green zone Symptoms in this zone mean that you are doing well. They include:  Being able to do your usual activities and exercise.  Having the usual amount of coughing, including the same amount of phlegm or mucus.  Being able to sleep well.  Having a good appetite. Follow these instructions at home:  Continue taking your daily medicines as told by your health care provider.  Make sure you receive all the immunizations that your health care provider recommends, especially the pneumococcal and influenza vaccines.  Wash your hands often with soap and  water. Have family members wash their hands too. Regular hand washing can help prevent infections.  Follow your usual exercise and diet plan.  Avoid irritants in the air, such as smoke.  Do not use any products that contain nicotine or tobacco, such as cigarettes and e-cigarettes. If you need help quitting, ask your health care provider. Where to find more information: You can find more information about COPD from:  American Lung Association, My COPD Action Plan: SlotDealers.si.pdf  COPD Foundation: www.copdfoundation.[place]: http://cline.com/ This information is not intended to replace advice given to you by your health care provider. Make sure you discuss any questions you have with your health care provider. Document Released: 08/30/2016 Document Revised: 05/24/2017 Document Reviewed: 08/30/2016 Elsevier Interactive Patient Education  2019 [employer].

## 2018-09-30 NOTE — Chronic Care Management (AMB) (Signed)
  Chronic Care Management   Follow Up Note   09/30/2018 Name: Philip LEMBKE Sr. MRN: [id number] DOB: June 24, 1945  Referred by: [name], DO Reason for referral : No chief complaint on file.   Philip Potters Sr. is a 73 y.o. year old male who is a primary care patient of [name], DO. The CCM team was consulted for assistance with chronic disease management and care coordination needs.    Review of patient status, including review of consultants reports, relevant laboratory and other test results, and collaboration with appropriate care team members and the patient's provider was performed as part of comprehensive patient evaluation and provision of chronic care management services.    Initial outreach call to patient. Patient very engaged. Discussed COPD teaching at length. Plan to continue to follow for CCM.  Goals Addressed            This Visit's Progress   . I would like to work on my COPD (pt-stated)       Current Barriers:  [name] Knowledge deficits related to basic understanding of COPD disease process . Knowledge deficits related to basic COPD self care/management . Knowledge deficit related to basic understanding of how to use inhalers and how inhaled medications work   Case Manager Clinical Goal(s):  Over the next 90 days patient will report using inhalers as prescribed including rinsing mouth after use  Over the next 90 days patient will report utilizing pursed lip breathing for shortness of breath  Over the next 90 days, patient will be able to verbalize understanding of COPD action plan and when to seek appropriate levels of medical care  Over the next 90 days, patient will engage in lite exercise as tolerated to build/regain stamina and strength and reduce shortness of breath through activity tolerance  Over the next 90 days, patient will verbalize basic understanding of COPD disease process and self care activities  Over the next 90  days, patient will not be hospitalized for COPD exacerbation   Interventions:   Provided patient with basic written and verbal COPD education on self care/management/and exacerbation prevention   Provided patient with COPD action plan and reinforced importance of daily self assessment  Discussed Pulmonary Rehab and offered to assist with referral placement  Provided written and verbal instructions on pursed lip breathing and utilized returned demonstration as teach back  Provided instruction about proper use of medications used for management of COPD including inhalers  Advised patient to self assesses COPD action plan zone and make appointment with provider if in the yellow zone for 48 hours without improvement.   Pharmacy referral for medication review and teaching  Plan to look into obtaining a Flutter Valve  Patient Self Care Activities:  Takes medications as prescribed including inhalers  Engages in light exercise 3-5 days a week   Initial goal documentation         The care management team will reach out to the patient again over the next 14 days.  The patient has been provided with contact information for the care management team and has been advised to call with any health related questions or concerns.   Merlene Morse Philip Feasel RN, BSN Nurse Case Pharmacist, [facility]  [phone]) [employer]

## 2018-10-02 ENCOUNTER — Ambulatory Visit (INDEPENDENT_AMBULATORY_CARE_PROVIDER_SITE_OTHER): Payer: Medicare Other | Admitting: Pharmacist

## 2018-10-02 DIAGNOSIS — E1142 Type 2 diabetes mellitus with diabetic polyneuropathy: Secondary | ICD-10-CM | POA: Diagnosis not present

## 2018-10-02 DIAGNOSIS — J432 Centrilobular emphysema: Secondary | ICD-10-CM

## 2018-10-02 NOTE — Chronic Care Management (AMB) (Signed)
Chronic Care Management   Note  10/02/2018 Name: BASIR NIVEN Sr. MRN: [id number] DOB: [birth year]   Subjective:   [name] is a 73 y.o. year old male who is a primary care patient of [name], DO. The CM team was consulted for assistance with chronic disease management and care coordination. Mr. Dreisbach has a past medical history including but not limited to type 2 diabetes, CAD, atrial fibrillation, hypothyroidism, OSA off of CPAP, hyperlipidemia, hypertension, CKD, seasonal allergies and COPD.  I reached out to [place] by phone today, as referred by CM Nurse Case Manager, to perform medication review.  Review of patient status, including review of consultants reports, laboratory and other test data, was performed as part of comprehensive evaluation and provision of chronic care management services.   Objective:  Lab Results  Component Value Date   CREATININE 1.02 06/24/2018   CREATININE 1.27 (H) 07/31/2017   CREATININE 1.15 05/07/2017  Calculated CrCl: > 60 mL/min   Lab Results  Component Value Date   HGBA1C 8.1 (H) 06/24/2018       Component Value Date/Time   CHOL 125 06/24/2018 0846   CHOL 139 12/14/2014 0834   TRIG 97 06/24/2018 0846   HDL 46 06/24/2018 0846   HDL 43 12/14/2014 0834   CHOLHDL 2.7 06/24/2018 0846   LDLCALC 61 06/24/2018 0846     BP Readings from Last 3 Encounters:  09/10/18 140/72  07/01/18 (!) 101/54  04/01/18 131/63    Allergies  Allergen Reactions  . Bydureon [Exenatide] Anaphylaxis    Possible from bydureon, not 100% confirmed  . Clindamycin/Lincomycin Hives and Itching  . Penicillins Hives, Itching, Rash and Other (See Comments)    Has patient had a PCN reaction causing immediate rash, facial/tongue/throat swelling, SOB or lightheadedness with hypotension: Yes Has patient had a PCN reaction causing severe rash involving mucus membranes or skin necrosis: No Has patient had a PCN reaction that required  hospitalization: No Has patient had a PCN reaction occurring within the last 10 years: No If all of the above answers are "NO", then may proceed with Cephalosporin use.  . Sulfa Antibiotics Shortness Of Breath  . Spiriferis Other (See Comments)    Affected breathing  . Ivp Dye [Iodinated Diagnostic Agents] Rash and Other (See Comments)    Redness    Medications Reviewed Today    Reviewed by Vella Raring, [place] (Pharmacist) on 10/02/18 at [place] [facility] Status: <None>  Medication Order Taking? Sig Documenting Provider Last Dose Status Informant  albuterol (PROVENTIL) (2.5 MG/3ML) 0.083% nebulizer solution [id number] Yes Inhale 3 mLs into the lungs every 6 (six) hours as needed for wheezing or shortness of breath.  [provider] Taking Active Self           Med Note Sandrea Hammond D   Mon Dec 04, 2016 11:33 AM)    Albuterol Sulfate 108 (90 Base) MCG/ACT AEPB [id number] No Inhale 2 puffs into the lungs every 6 (six) hours as needed (shortness of breath).  [provider] Not Taking Active Self  Artificial Tear Ointment (DRY EYES OP) [id number] Yes Place 1 drop into both eyes daily as needed (for dry eyes). [provider] Taking Active Self  atorvastatin (LIPITOR) 40 MG tablet [id number] Yes Take 1 tablet by mouth once daily [name], MD Taking Active   cetirizine (ZYRTEC) 10 MG tablet [id number] Yes Take 10 mg by mouth daily.  [provider] Taking Active  diclofenac sodium (VOLTAREN) 1 % GEL [id number] Yes Apply 2 g topically 3 (three) times daily as needed (Use on elbow bursitis and knee). [name], DO Taking Active Self  ELIQUIS 5 MG TABS tablet [id number] Yes Take 1 tablet by mouth twice daily [name], MD Taking Active         Discontinued 10/02/18 1519 (No longer needed (for PRN medications))   furosemide (LASIX) 40 MG tablet [id number] Yes TAKE 1 TABLET BY MOUTH ONCE DAILY [name], MD Taking Active    gabapentin (NEURONTIN) 100 MG capsule [id number] Yes Take 1 capsule (100 mg total) by mouth 3 (three) times daily. [name], DO Taking Active Self  glucose blood (ONE TOUCH ULTRA TEST) test strip [id number]  1 each by Other route 2 (two) times daily. Use as instructed to check bloods sugar twice a day. [name], DO  Active   glucose monitoring kit (FREESTYLE) monitoring kit [id number]  1 each by Does not apply route as needed for other. [name], MD  Active Self  LANCETS [place] [id number]  1 Device by Does not apply route 4 (four) times daily -  before meals and at bedtime. [name], MD  Active Self  levothyroxine (SYNTHROID) 88 MCG tablet [id number] Yes Take 1 tablet by mouth once daily [name], DO Taking Active   losartan (COZAAR) 25 MG tablet [id number] Yes Take 25 mg by mouth daily. [provider] Taking Active   metFORMIN (GLUCOPHAGE) 500 MG tablet [id number] Yes Take 2 tablets (1,000 mg total) by mouth 2 (two) times daily with a meal. [name], [name], DO Taking Active   metoprolol tartrate (LOPRESSOR) 25 MG tablet [id number] Yes TAKE 1 TABLET BY MOUTH TWICE DAILY [name], MD Taking Active   montelukast (SINGULAIR) 10 MG tablet [id number] Yes Take 10 mg by mouth at bedtime.  [provider] Taking Active Self           Med Note Arvin Collard, MARGARET D   Fri Jul 23, 2015  9:05 AM)    predniSONE (DELTASONE) 5 MG tablet [id number] Yes Take 5 mg by mouth daily. [provider] Taking Active   SYMBICORT 160-4.5 MCG/ACT inhaler [id number] Yes Inhale 2 puffs 2 (two) times daily into the lungs.  [provider] Taking Active Self           Assessment:   Goals Addressed            This Visit's Progress   . Medication Management       Current Barriers:  [name] Knowledge Deficits related to correct use for COPD/pulmonary medications  . Lack of blood glucose results for clinical team  - meter currently in need of new battery  Pharmacist Clinical Goal(s):  [name] Over the next 30 days, patient will work with CM Pharmacist to address needs related to medication regimen optimization.  Interventions: . Comprehensive medication review performed. o Education provided regarding use of COPD inhalers - patient reports currently using Symbicort inhaler as needed for shortness of breath and denies currently having a albuterol rescue inhaler - Counsel patient to use Symbicort as maintenance inhaler as directed by pulmonologist, Dr. Raul Del, 2 puffs by mouth twice daily.  - Counsel patient regarding the importance of rinsing his mouth out after each use. . Mr. Valentine reports self-increasing his cetirizine 10 mg from taking it once daily to now taking it three times daily for allergy symptoms. Patient also complains of  issues with chronic dry eyes. o Counsel patient regarding increase risk of adverse effects with taking > than the maximum recommended dose. Advise patient to take as directed by pulmonologist and to follow up regarding alternatives if symptoms not controlled by this dose - Advise patient against taking Mucinex with phenylephrine due to cardiovascular risk. Advise that if to take Mucinex for chest congestion, to use formulation with just guaifenesin, with full glass of water . Counsel patient on importance of blood glucose monitoring and recording results. o Patient reports that his meter is not currently working because it needs a new Banker patient to pick up new battery and restart checking blood sugar o Patient to call if in need of new glucometer . Collaboration with [facility] Pulmonology re: new prescription for albuterol rescue inhaler. Note that generic is preferred (tier 2) through the patient's prescription plan. o Perform chart review - new prescription for albuterol inhaler sent to patient's pharmacy  Patient Self Care Activities:  . Self administers  medications as prescribed . Attends all scheduled provider appointments . Calls pharmacy for medication refills . Calls provider office for new concerns or questions  . Patient to check blood sugar regularly as directed  o Current meter: One Touch Ultra Mini o Patient to obtain new battery for glucometer  Initial goal documentation        Plan:  Telephone follow up appointment with care management team member scheduled for: 10/09/18 at 2 pm  [name], PharmD, [place] [phone]

## 2018-10-04 NOTE — Patient Instructions (Signed)
Thank you allowing the Chronic Care Management Team to be a part of your care! It was a pleasure speaking with you today!     CCM (Chronic Care Management) Team    Janci Minor RN, BSN Nurse Care Coordinator  [phone]   [name] PharmD  Clinical Pharmacist  [phone]   Eula Fried LCSW Clinical Social Worker [phone]  Visit Information  Goals Addressed            This Visit's Progress   . Medication Management       Current Barriers:  [name] Knowledge Deficits related to correct use for COPD/pulmonary medications  . Lack of blood glucose results for clinical team - meter currently in need of new battery  Pharmacist Clinical Goal(s):  [name] Over the next 30 days, patient will work with CM Pharmacist to address needs related to medication regimen optimization.  Interventions: . Comprehensive medication review performed. o Education provided regarding use of COPD inhalers - patient reports currently using Symbicort inhaler as needed for shortness of breath and denies currently having a albuterol rescue inhaler - Counsel patient to use Symbicort as maintenance inhaler as directed by pulmonologist, Dr. Raul Del, 2 puffs by mouth twice daily.  - Counsel patient regarding the importance of rinsing his mouth out after each use. . Mr. Loconte reports self-increasing his cetirizine 10 mg from taking it once daily to now taking it three times daily for allergy symptoms. Patient also complains of issues with chronic dry eyes. o Counsel patient regarding increase risk of adverse effects with taking > than the maximum recommended dose. Advise patient to take as directed by pulmonologist and to follow up regarding alternatives if symptoms not controlled by this dose - Advise patient against taking Mucinex with phenylephrine due to cardiovascular risk. Advise that if to take Mucinex for chest congestion, to use formulation with just guaifenesin, with full glass of water . Counsel  patient on importance of blood glucose monitoring and recording results. o Patient reports that his meter is not currently working because it needs a new Banker patient to pick up new battery and restart checking blood sugar o Patient to call if in need of new glucometer . Collaboration with [facility] Pulmonology re: new prescription for albuterol rescue inhaler. Note that generic is preferred (tier 2) through the patient's prescription plan. o Perform chart review - new prescription for albuterol inhaler sent to patient's pharmacy  Patient Self Care Activities:  . Self administers medications as prescribed . Attends all scheduled provider appointments . Calls pharmacy for medication refills . Calls provider office for new concerns or questions  . Patient to check blood sugar regularly as directed  o Current meter: One Touch Ultra Mini o Patient to obtain new battery for glucometer  Initial goal documentation        The patient verbalized understanding of instructions provided today and declined a print copy of patient instruction materials.   Telephone follow up appointment with care management team member scheduled for:6/10 at 2 pm  [name], PharmD, [place] [phone]

## 2018-10-09 ENCOUNTER — Ambulatory Visit: Payer: Self-pay | Admitting: Pharmacist

## 2018-10-09 ENCOUNTER — Telehealth: Payer: Self-pay

## 2018-10-09 DIAGNOSIS — E1142 Type 2 diabetes mellitus with diabetic polyneuropathy: Secondary | ICD-10-CM

## 2018-10-09 NOTE — Chronic Care Management (AMB) (Signed)
  Chronic Care Management   Note  10/09/2018 Name: PAZ WINSETT Sr. MRN: [id number] DOB: 12/02/1945  [name] is a 73 y.o. year old male who is a primary care patient of [name], DO. The CM team was consulted for assistance with chronic disease management and care coordination. Mr. Wymer has a past medical history including but not limited to type 2 diabetes, CAD, atrial fibrillation, hypothyroidism, OSA off of CPAP, hyperlipidemia, hypertension, CKD, seasonal allergies and COPD.  I reached out to [name] by phone today, as scheduled, to follow up regarding medication management and diabetes management.   Was unable to reach patient via telephone today and unable to leave a message as patient's voicemail is not setup.   Follow up plan: The care management team will reach out to the patient again over the next 7 days.   [name], PharmD, [place] [employer] [phone]

## 2018-10-14 DIAGNOSIS — J439 Emphysema, unspecified: Secondary | ICD-10-CM | POA: Diagnosis not present

## 2018-10-14 DIAGNOSIS — G4733 Obstructive sleep apnea (adult) (pediatric): Secondary | ICD-10-CM | POA: Diagnosis not present

## 2018-10-14 DIAGNOSIS — R0600 Dyspnea, unspecified: Secondary | ICD-10-CM | POA: Diagnosis not present

## 2018-10-15 ENCOUNTER — Ambulatory Visit: Payer: Self-pay | Admitting: Pharmacist

## 2018-10-15 DIAGNOSIS — E1142 Type 2 diabetes mellitus with diabetic polyneuropathy: Secondary | ICD-10-CM

## 2018-10-15 DIAGNOSIS — J449 Chronic obstructive pulmonary disease, unspecified: Secondary | ICD-10-CM

## 2018-10-15 NOTE — Patient Instructions (Signed)
Thank you allowing the Chronic Care Management Team to be a part of your care! It was a pleasure speaking with you today!     CCM (Chronic Care Management) Team    Janci Minor RN, BSN Nurse Care Coordinator  [phone]   [name] PharmD  Clinical Pharmacist  [phone]   Eula Fried LCSW Clinical Social Worker [phone]  Visit Information  Goals Addressed            This Visit's Progress   . Medication Management       Current Barriers:  . Lack of blood glucose results for clinical team - meter currently in need of new battery  Pharmacist Clinical Goal(s):  [name] Over the next 30 days, patient will work with CM Pharmacist to address needs related to medication regimen optimization.  Interventions: . Perform chart review o Patient seen by Pulmonologist, Dr. Raul Del, yesterday. Per provider, patient's COPD improved since taking his medications as directed. Patient's prednisone dose decreased from 5 mg daily to 5 mg every other day. Patient confirms taking every other day as directed. o Patient back on cpap nightly and tolerating it . Counseling on correct use of COPD and allergy medications o Reports now using maintenance and rescue inhalers appropriately as prescribed.  - Reports using Symbicort - 2 puffs by mouth twice daily and rinsing his mouth out after each use. - Confirms picking up new albuterol inhaler prescription and using as needed as directed o Reports now taking cetirizine 10 mg once daily as directed (patient previously taking three times daily)  - Patient reports improvement with chronic dry eyes with decrease in cetirizine dose and consistent use of his eye drops. [name] Counsel patient on importance of blood glucose monitoring and recording results. o Patient reports that his meter is not currently working because he has not yet found a new battery o Counsel patient to pick up new battery and restart checking blood sugar o Review [employer]textile technologist website and provide patient with required battery type.  Patient Self Care Activities:  . Self administers medications as prescribed . Attends all scheduled provider appointments . Calls pharmacy for medication refills . Calls provider office for new concerns or questions  . Patient to check blood sugar regularly as directed  o Current meter: One Touch Ultra Mini o Patient to obtain new battery for glucometer  Please see past updates related to this goal by clicking on the "Past Updates" button in the selected goal         The patient verbalized understanding of instructions provided today and declined a print copy of patient instruction materials.   Telephone follow up appointment with care management team member scheduled for: 6/17  [name], PharmD, [place] [employer] [phone]

## 2018-10-15 NOTE — Chronic Care Management (AMB) (Signed)
  Chronic Care Management   Follow Up Note   10/15/2018 Name: Philip LAWHORN Sr. MRN: [id number] DOB: January 10, 1946  Referred by: [name], DO Reason for referral : Chronic Care Management (Patient Phone Call)   Philip Potters Sr. is a 73 y.o. year old male who is a primary care patient of [name], DO. The CCM team was consulted for assistance with chronic disease management and care coordination needs.  Philip [name] has a past medical history including but not limited to type 2 diabetes, CAD, atrial fibrillation, hypothyroidism, OSA, hyperlipidemia, hypertension, CKD, seasonal allergies and COPD.  I reached out to Philip Potters Sr. by phone today to follow up regarding medication management and diabetes management.    Review of patient status, including review of consultants reports, relevant laboratory and other test results, and collaboration with appropriate care team members and the patient's provider was performed as part of comprehensive patient evaluation and provision of chronic care management services.    Goals Addressed            This Visit's Progress   . Medication Management       Current Barriers:  . Lack of blood glucose results for clinical team - meter currently in need of new battery  Pharmacist Clinical Goal(s):  [name] Over the next 30 days, patient will work with CM Pharmacist to address needs related to medication regimen optimization.  Interventions: . Perform chart review o Patient seen by Pulmonologist, Dr. Raul Del, yesterday. Per provider, patient's COPD improved since taking his medications as directed. Patient's prednisone dose decreased from 5 mg daily to 5 mg every other day. Patient confirms taking every other day as directed. o Patient back on cpap nightly and tolerating it . Counseling on correct use of COPD and allergy medications o Reports now using maintenance and rescue inhalers appropriately as prescribed.  - Reports  using Symbicort - 2 puffs by mouth twice daily and rinsing his mouth out after each use. - Confirms picking up new albuterol inhaler prescription and using as needed as directed o Reports now taking cetirizine 10 mg once daily as directed (patient previously taking three times daily)  - Patient reports improvement with chronic dry eyes with decrease in cetirizine dose and consistent use of his eye drops. [name] Counsel patient on importance of blood glucose monitoring and recording results. o Patient reports that his meter is not currently working because he has not yet found a new battery o Counsel patient to pick up new battery and restart checking blood sugar o Review [employer] administrator website and provide patient with required battery type.  Patient Self Care Activities:  . Self administers medications as prescribed . Attends all scheduled provider appointments . Calls pharmacy for medication refills . Calls provider office for new concerns or questions  . Patient to check blood sugar regularly as directed  o Current meter: One Touch Ultra Mini o Patient to obtain new battery for glucometer  Please see past updates related to this goal by clicking on the "Past Updates" button in the selected goal         Plan  Telephone follow up appointment with care management team member scheduled for: 6/17 - to confirm patient able to replace glucometer battery and meter working  [name], PharmD, [place] [phone]

## 2018-10-16 ENCOUNTER — Ambulatory Visit: Payer: Self-pay | Admitting: Pharmacist

## 2018-10-16 DIAGNOSIS — E1142 Type 2 diabetes mellitus with diabetic polyneuropathy: Secondary | ICD-10-CM

## 2018-10-16 NOTE — Patient Instructions (Signed)
Thank you allowing the Chronic Care Management Team to be a part of your care! It was a pleasure speaking with you today!     CCM (Chronic Care Management) Team    Janci Minor RN, BSN Nurse Care Coordinator  [phone]   [name] PharmD  Clinical Pharmacist  [phone]   Eula Fried LCSW Clinical Social Worker [phone]  Visit Information  Goals Addressed            This Visit's Progress   . Medication Management       Current Barriers:  . Dietary indiscretions - patient reports eating foods and beverages that he knows to drive up his blood sugar . Lack of blood glucose results for clinical team   Pharmacist Clinical Goal(s):  [name] Over the next 30 days, patient will work with CM Pharmacist to address needs related to medication regimen optimization.  Interventions: . Counsel patient on importance of blood glucose monitoring and recording results. o Patient reports that he replaced the battery on his glucometer and his meter is working again o Patient checks blood sugar now, result is 307 mg/dL - Reports eating lunch 2-3 hours ago: steak and cheese on pita bread, Sweet tea and Jelly donut o Discuss with patient potential dietary changes to improve his blood sugar control, including dietary choices, portion size and stopping eating when full o Encouraged patient to to use fasting and post prandial blood sugar checks to evaluate carbohydrate content of meals.  o Patient declines need for written diabetes education or materials . Counsel patient on the importance of medication adherence o Reports good current level of adherence, missing a dose 1-2 times/month o Patient reports using a weekly pillbox to aid with adherence. Discuss other adherence aids such as timing of administration and use of alarms.  Patient Self Care Activities:  . Self administers medications as prescribed . Attends all scheduled provider appointments . Calls pharmacy for medication  refills . Calls provider office for new concerns or questions  . Patient to check blood sugar regularly as directed and keep log o Current meter: One Touch Ultra Mini . Patient will work on dietary modifications o Reducing portion sizes o Reducing total carbohydrate intake at each meal o Stopping eating when full, rather than when plate empty  Please see past updates related to this goal by clicking on the "Past Updates" button in the selected goal         The patient verbalized understanding of instructions provided today and declined a print copy of patient instruction materials.   Telephone follow up appointment with care management team member scheduled for: 10/30/18  [name], PharmD, [facility] [phone]

## 2018-10-16 NOTE — Chronic Care Management (AMB) (Signed)
  Chronic Care Management   Follow Up Note   10/16/2018 Name: Philip GRANDISON Sr. MRN: [id number] DOB: 10-27-1945  Referred by: [name], DO Reason for referral : Chronic Care Management (Patient Phone Call)   Philip Potters Sr. is a 73 y.o. year old male who is a primary care patient of [name], DO. The CCM team was consulted for assistance with chronic disease management and care coordination needs.  Mr. Lindahl has a past medical history including but not limited to type 2 diabetes, CAD, atrial fibrillation, hypothyroidism, OSA, hyperlipidemia, hypertension, CKD, seasonal allergies and COPD.  I reached out to Philip Potters Sr. by phone today to follow up regarding medication management and diabetes management.  Review of patient status, including review of consultants reports, relevant laboratory and other test results, and collaboration with appropriate care team members and the patient's provider was performed as part of comprehensive patient evaluation and provision of chronic care management services.    Goals Addressed            This Visit's Progress   . Medication Management       Current Barriers:  . Dietary indiscretions - patient reports eating foods and beverages that he knows to drive up his blood sugar . Lack of blood glucose results for clinical team   Pharmacist Clinical Goal(s):  [name] Over the next 30 days, patient will work with CM Pharmacist to address needs related to medication regimen optimization.  Interventions: . Counsel patient on importance of blood glucose monitoring and recording results. o Patient reports that he replaced the battery on his glucometer and his meter is working again o Patient checks blood sugar now, result is 307 mg/dL - Reports eating lunch 2-3 hours ago: steak and cheese on pita bread, Sweet tea and Jelly donut o Discuss with patient potential dietary changes to improve his blood sugar control, including  dietary choices, portion size and stopping eating when full o Encouraged patient to to use fasting and post prandial blood sugar checks to evaluate carbohydrate content of meals.  o Patient declines need for written diabetes education or materials . Counsel patient on the importance of medication adherence o Reports good current level of adherence, missing a dose 1-2 times/month o Patient reports using a weekly pillbox to aid with adherence. Discuss other adherence aids such as timing of administration and use of alarms.  Patient Self Care Activities:  . Self administers medications as prescribed . Attends all scheduled provider appointments . Calls pharmacy for medication refills . Calls provider office for new concerns or questions  . Patient to check blood sugar regularly as directed and keep log o Current meter: One Touch Ultra Mini . Patient will work on dietary modifications o Reducing portion sizes o Reducing total carbohydrate intake at each meal o Stopping eating when full, rather than when plate empty  Please see past updates related to this goal by clicking on the "Past Updates" button in the selected goal         Plan  Telephone follow up appointment with care management team member scheduled for: 10/30/2018  [name], PharmD, [place] [phone]

## 2018-10-21 ENCOUNTER — Telehealth: Payer: Self-pay

## 2018-10-24 ENCOUNTER — Telehealth: Payer: Self-pay

## 2018-10-30 ENCOUNTER — Ambulatory Visit: Payer: Self-pay | Admitting: Pharmacist

## 2018-10-30 DIAGNOSIS — J449 Chronic obstructive pulmonary disease, unspecified: Secondary | ICD-10-CM

## 2018-10-30 DIAGNOSIS — E1142 Type 2 diabetes mellitus with diabetic polyneuropathy: Secondary | ICD-10-CM

## 2018-10-30 NOTE — Chronic Care Management (AMB) (Signed)
  Chronic Care Management   Follow Up Note   10/30/2018 Name: MALEAK BRAZZEL Sr. MRN: [id number] DOB: 13-Sep-1945  Referred by: [name], DO Reason for referral : Chronic Care Management (Patient Phone Call)   [name] is a 73 y.o. year old male who is a primary care patient of [name], DO. The CCM team was consulted for assistance with chronic disease management and care coordination needs.  Mr. Espindola has a past medical history including but not limited to type 2 diabetes, CAD, atrial fibrillation, hypothyroidism, OSA, hyperlipidemia, hypertension, CKD, seasonal allergies and COPD.  I reached out to [name] by phone today to follow up regarding medication management and diabetes management.  Review of patient status, including review of consultants reports, relevant laboratory and other test results, and collaboration with appropriate care team members and the patient's provider was performed as part of comprehensive patient evaluation and provision of chronic care management services.    Goals Addressed            This Visit's Progress   . Medication Management       Current Barriers:  . Dietary indiscretions - patient reports eating foods and beverages that he knows to drive up his blood sugar . Lack of blood glucose results for clinical team   Pharmacist Clinical Goal(s):  [name] Over the next 30 days, patient will work with CM Pharmacist to address needs related to medication regimen optimization.  Interventions: . Counsel patient on importance of blood sugar control and monitoring o Denies having kept record of blood sugars o Reports that his morning fasting readings have recently been between 140-150 mg/dL o Discuss with patient potential dietary changes to improve his blood sugar control. [name] Counsel patient on the importance of medication adherence  Patient Self Care Activities:  . Self administers medications as prescribed .  Attends all scheduled provider appointments . Calls pharmacy for medication refills . Calls provider office for new concerns or questions  . Patient to check blood sugar regularly as directed and keep log . Patient will continue work on dietary modifications o Reducing portion sizes o Reducing total carbohydrate intake at each meal o Stopping eating when full, rather than when plate empty  Please see past updates related to this goal by clicking on the "Past Updates" button in the selected goal         Plan  Telephone follow up appointment with care management team member scheduled for: 7/16 at 3:30 pm  [name], PharmD, [place] [phone]

## 2018-10-30 NOTE — Patient Instructions (Signed)
Thank you allowing the Chronic Care Management Team to be a part of your care! It was a pleasure speaking with you today!     CCM (Chronic Care Management) Team    Janci Minor RN, BSN Nurse Care Coordinator  [phone]   [name] PharmD  Clinical Pharmacist  [phone]   Eula Fried LCSW Clinical Social Worker [phone]  Visit Information  Goals Addressed            This Visit's Progress   . Medication Management       Current Barriers:  . Dietary indiscretions - patient reports eating foods and beverages that he knows to drive up his blood sugar . Lack of blood glucose results for clinical team   Pharmacist Clinical Goal(s):  [name] Over the next 30 days, patient will work with CM Pharmacist to address needs related to medication regimen optimization.  Interventions: . Counsel patient on importance of blood sugar control and monitoring o Denies having kept record of blood sugars o Reports that his morning fasting readings have recently been between 140-150 mg/dL o Discuss with patient potential dietary changes to improve his blood sugar control. [name] Counsel patient on the importance of medication adherence  Patient Self Care Activities:  . Self administers medications as prescribed . Attends all scheduled provider appointments . Calls pharmacy for medication refills . Calls provider office for new concerns or questions  . Patient to check blood sugar regularly as directed and keep log . Patient will continue work on dietary modifications o Reducing portion sizes o Reducing total carbohydrate intake at each meal o Stopping eating when full, rather than when plate empty  Please see past updates related to this goal by clicking on the "Past Updates" button in the selected goal         The patient verbalized understanding of instructions provided today and declined a print copy of patient instruction materials.   Telephone follow up appointment  with care management team member scheduled for:11/14/18 at 3:30 pm  [name], PharmD, [place] [phone]

## 2018-11-04 ENCOUNTER — Telehealth: Payer: Self-pay

## 2018-11-07 ENCOUNTER — Ambulatory Visit: Payer: Medicare Other | Admitting: *Deleted

## 2018-11-07 DIAGNOSIS — J4489 Other specified chronic obstructive pulmonary disease: Secondary | ICD-10-CM

## 2018-11-07 DIAGNOSIS — J449 Chronic obstructive pulmonary disease, unspecified: Secondary | ICD-10-CM

## 2018-11-07 DIAGNOSIS — E1142 Type 2 diabetes mellitus with diabetic polyneuropathy: Secondary | ICD-10-CM

## 2018-11-07 NOTE — Patient Instructions (Signed)
Thank you allowing the Chronic Care Management Team to be a part of your care! It was a pleasure speaking with you today!   CCM (Chronic Care Management) Team   Corianna Avallone RN, BSN Nurse Care Coordinator  [phone]  [name] PharmD  Clinical Pharmacist  [phone]  Eula Fried LCSW Clinical Social Worker [phone]  Goals Addressed            This Visit's Progress   . I would like to work on my COPD (pt-stated)       Current Barriers:  [name] Knowledge deficits related to basic understanding of COPD disease process . Knowledge deficits related to basic COPD self care/management . Knowledge deficit related to basic understanding of how to use inhalers and how inhaled medications work   Case Manager Clinical Goal(s):  Over the next 90 days patient will report using inhalers as prescribed including rinsing mouth after use  Over the next 90 days patient will report utilizing pursed lip breathing for shortness of breath  Over the next 90 days, patient will be able to verbalize understanding of COPD action plan and when to seek appropriate levels of medical care  Over the next 90 days, patient will engage in lite exercise as tolerated to build/regain stamina and strength and reduce shortness of breath through activity tolerance  Over the next 90 days, patient will verbalize basic understanding of COPD disease process and self care activities  Over the next 90 days, patient will not be hospitalized for COPD exacerbation   Interventions:   Provided patient with basic written and verbal COPD education on self care/management/and exacerbation prevention   Provided patient with COPD action plan and reinforced importance of daily self assessment  Discussed Pulmonary Rehab and offered to assist with referral placement  Provided written and verbal instructions on pursed lip breathing and utilized returned demonstration as teach back  Provided instruction about  proper use of medications used for management of COPD including inhalers  Advised patient to self assesses COPD action plan zone and make appointment with provider if in the yellow zone for 48 hours without improvement.   Pharmacy referral for medication review and teaching  Reviewed COPD education and information about pulmonary rehab cost, and process.    Patient Self Care Activities:  Takes medications as prescribed including inhalers  Engages in light exercise 3-5 days a week   Please see past updates related to this goal by clicking on the "Past Updates" button in the selected goal         The patient verbalized understanding of instructions provided today and declined a print copy of patient instruction materials.   The patient has been provided with contact information for the care management team and has been advised to call with any health related questions or concerns.

## 2018-11-07 NOTE — Chronic Care Management (AMB) (Signed)
Chronic Care Management   Follow Up Note   11/07/2018 Name: Philip LOEWE Sr. MRN: [id number] DOB: 08-15-1945  Referred by: [name], DO Reason for referral : Chronic Care Management (DM/COPD education)   Philip Potters Sr. is a 73 y.o. year old male who is a primary care patient of [name], DO. The CCM team was consulted for assistance with chronic disease management and care coordination needs.    Review of patient status, including review of consultants reports, relevant laboratory and other test results, and collaboration with appropriate care team members and the patient's provider was performed as part of comprehensive patient evaluation and provision of chronic care management services.    Spoke to the patient on the phone. Discussed COPD and Diabetic diet.  Patient stated he was working on his diet to manage his sugars. Reported last 5 days of fasting sugars as- 145, 150, 146, 161, 154. Reports evening sugars 190, 280, 201, 340, 185.   Patient stated he was using his nebulizer BID, and described his breathing as "fair". He said he occassionally gets a little tight.     Goals Addressed            This Visit's Progress   . I would like to work on my COPD (pt-stated)       Current Barriers:  [name] Knowledge deficits related to basic understanding of COPD disease process . Knowledge deficits related to basic COPD self care/management . Knowledge deficit related to basic understanding of how to use inhalers and how inhaled medications work   Case Manager Clinical Goal(s):  Over the next 90 days patient will report using inhalers as prescribed including rinsing mouth after use  Over the next 90 days patient will report utilizing pursed lip breathing for shortness of breath  Over the next 90 days, patient will be able to verbalize understanding of COPD action plan and when to seek appropriate levels of medical care  Over the next 90 days,  patient will engage in lite exercise as tolerated to build/regain stamina and strength and reduce shortness of breath through activity tolerance  Over the next 90 days, patient will verbalize basic understanding of COPD disease process and self care activities  Over the next 90 days, patient will not be hospitalized for COPD exacerbation   Interventions:   Provided patient with basic written and verbal COPD education on self care/management/and exacerbation prevention   Provided patient with COPD action plan and reinforced importance of daily self assessment  Discussed Pulmonary Rehab and offered to assist with referral placement  Provided written and verbal instructions on pursed lip breathing and utilized returned demonstration as teach back  Provided instruction about proper use of medications used for management of COPD including inhalers  Advised patient to self assesses COPD action plan zone and make appointment with provider if in the yellow zone for 48 hours without improvement.   Pharmacy referral for medication review and teaching  Reviewed COPD education and information about pulmonary rehab cost, and process.    Patient Self Care Activities:  Takes medications as prescribed including inhalers  Engages in light exercise 3-5 days a week   Please see past updates related to this goal by clicking on the "Past Updates" button in the selected goal          The care management team will reach out to the patient again over the next 30 days.  The patient has been provided with contact information for  the care management team and has been advised to call with any health related questions or concerns.    Merlene Morse Jashiya Bassett RN, BSN Nurse Case Pharmacist, [facility]  [phone]) [employer]

## 2018-11-14 ENCOUNTER — Ambulatory Visit: Payer: Self-pay | Admitting: Pharmacist

## 2018-11-14 DIAGNOSIS — E1142 Type 2 diabetes mellitus with diabetic polyneuropathy: Secondary | ICD-10-CM

## 2018-11-14 DIAGNOSIS — I25810 Atherosclerosis of coronary artery bypass graft(s) without angina pectoris: Secondary | ICD-10-CM

## 2018-11-14 NOTE — Chronic Care Management (AMB) (Signed)
  Chronic Care Management   Follow Up Note   11/14/2018 Name: Philip [name] Sr. MRN: [id number] DOB: Mar 19, 1946  Referred by: [name], DO Reason for referral : Chronic Care Management (Patient Phone Call)   Philip [name] Sr. is a 73 y.o. year old male who is a primary care patient of [name], DO. The CCM team was consulted for assistance with chronic disease management and care coordination needs.  Philip [name] has a past medical history including but not limited to type 2 diabetes, CAD, atrial fibrillation, hypothyroidism, OSA, hyperlipidemia, hypertension, CKD, seasonal allergies and COPD.  I reached out to Philip [name] Sr. by phone today to follow up regarding medication management and diabetes management.  Review of patient status, including review of consultants reports, relevant laboratory and other test results, and collaboration with appropriate care team members and the patient's provider was performed as part of comprehensive patient evaluation and provision of chronic care management services.    Goals Addressed            This Visit's Progress   . Medication Management       Current Barriers:  . Dietary indiscretions - patient reports eating foods and beverages that he knows to drive up his blood sugar  Pharmacist Clinical Goal(s):  [name] Over the next 30 days, patient will work with CM Pharmacist to address needs related to medication regimen optimization.  Interventions: . Counsel patient on importance of blood sugar control and monitoring o Review recent blood sugar results (see results below) o Discuss with patient potential dietary changes to improve his blood sugar control. o Per chart, patient does not currently have an upcoming PCP visit scheduled . Will reach out to PCP to discuss the possible addition of SGLT-2 inhibitor or another agent for improved blood sugar control o Note SGLT-2 inhibitors recommended in patients with HF and  ASCVD (Jardiance is a preferred option through patient's health plan coverage) - Monitoring required for increased risk of hypotension if prescribed with loop diuretic (furosemide) . Counsel patient on the importance of medication adherence ? Confirms using maintenance and rescue inhalers appropriately as prescribed ? Reports breathing is currently fine as long as he avoids being out in the heat  Patient Self Care Activities:  . Self administers medications as prescribed . Attends all scheduled provider appointments . Calls pharmacy for medication refills . Calls provider office for new concerns or questions  . Patient to check blood sugar regularly as directed and keep log Date Fasting Blood Glucose After Supper  (~2 hours)  10 - July 185 323  11 - July 149 314  12 - July 165 270  13 - July 153 336  14 - July 158 228  15 - July 160 358  16 - July 204 -  Average 168 305   . Patient will continue work on dietary modifications o Reducing portion sizes o Reducing total carbohydrate intake at each meal o Stopping eating when full, rather than when plate empty  Please see past updates related to this goal by clicking on the "Past Updates" button in the selected goal         Plan  The care management team will reach out to the patient again over the next 7 days.   [name], PharmD, [place] [employer] [phone]

## 2018-11-15 ENCOUNTER — Telehealth: Payer: Self-pay | Admitting: Family Medicine

## 2018-11-15 NOTE — Telephone Encounter (Signed)
Agree with plan. Last office visit with me 06/2018 I asked patient to check with insurance and consider Januvia vs SGLT2 options. He has not scheduled f/u yet.  I would ask that patient schedule DM follow-up in next 2-6 weeks approximately to discuss med change with likely add Jardiance as recommended by [place] in copied note below.  [name], [facility] 11/15/2018, 9:14 AM

## 2018-11-15 NOTE — Patient Instructions (Signed)
Thank you allowing the Chronic Care Management Team to be a part of your care! It was a pleasure speaking with you today!     CCM (Chronic Care Management) Team    Janci Minor RN, BSN Nurse Care Coordinator  [phone]   [name] PharmD  Clinical Pharmacist  [phone]   Eula Fried LCSW Clinical Social Worker [phone]  Visit Information  Goals Addressed            This Visit's Progress   . Medication Management       Current Barriers:  . Dietary indiscretions - patient reports eating foods and beverages that he knows to drive up his blood sugar  Pharmacist Clinical Goal(s):  [name] Over the next 30 days, patient will work with CM Pharmacist to address needs related to medication regimen optimization.  Interventions: . Counsel patient on importance of blood sugar control and monitoring o Review recent blood sugar results (see results below) o Discuss with patient potential dietary changes to improve his blood sugar control. o Per chart, patient does not currently have an upcoming PCP visit scheduled . Will reach out to PCP to discuss the possible addition of SGLT-2 inhibitor or another agent for improved blood sugar control . Counsel patient on the importance of medication adherence ? Confirms using maintenance and rescue inhalers appropriately as prescribed ? Reports breathing is currently fine as long as he avoids being out in the heat  Patient Self Care Activities:  . Self administers medications as prescribed . Attends all scheduled provider appointments . Calls pharmacy for medication refills . Calls provider office for new concerns or questions  . Patient to check blood sugar regularly as directed and keep log Date Fasting Blood Glucose After Supper  (~2 hours)  10 - July 185 323  11 - July 149 314  12 - July 165 270  13 - July 153 336  14 - July 158 228  15 - July 160 358  16 - July 204 -  Average 168 305   . Patient will continue  work on dietary modifications o Reducing portion sizes o Reducing total carbohydrate intake at each meal o Stopping eating when full, rather than when plate empty  Please see past updates related to this goal by clicking on the "Past Updates" button in the selected goal         The patient verbalized understanding of instructions provided today and declined a print copy of patient instruction materials.   The care management team will reach out to the patient again over the next 7 days.   [name], PharmD, [place] [employer] [phone]

## 2018-11-15 NOTE — Telephone Encounter (Signed)
-----   Message from Vella Raring, [facility] sent at 11/15/2018 12:27 AM EDT ----- Dr. [name],  Mr. Mabie reports morning fasting sugar average of 168 (range of 149-204) and post supper average of 305 (range of 228 - 358) over the past week. We have discussed dietary modifications and patient reports continuing to work on these changes. Reports adherence to metformin 1000 mg BID  Would you please consider adding an additional agent? Note that patient has an allergy listed of previous suspected anaphylaxis from [place]. SGLT-2 inhibitors, such as Jardiance (preferred option through patient's health plan) have CVD benefit in patients with ASCVD. Note that monitoring would be needed for increased risk of hypotension and possible dose decrease of patient's furosemide, given the diuretic effect of SGLT-2s.  Thank you!  [name], PharmD, [place] [employer] [phone]

## 2018-11-15 NOTE — Telephone Encounter (Signed)
The pt was notified and appt scheduled for August 3rd.

## 2018-11-16 ENCOUNTER — Other Ambulatory Visit: Payer: Self-pay | Admitting: Family Medicine

## 2018-11-16 DIAGNOSIS — E1142 Type 2 diabetes mellitus with diabetic polyneuropathy: Secondary | ICD-10-CM

## 2018-11-27 ENCOUNTER — Other Ambulatory Visit: Payer: Self-pay | Admitting: Cardiovascular Disease

## 2018-11-27 NOTE — Telephone Encounter (Signed)
Refill Request.  

## 2018-11-27 NOTE — Telephone Encounter (Signed)
Pt's age 73, wt 98.4 kg, SCr 1.02, CrCl 92.45.

## 2018-12-02 ENCOUNTER — Encounter: Payer: Self-pay | Admitting: Family Medicine

## 2018-12-02 ENCOUNTER — Other Ambulatory Visit: Payer: Self-pay

## 2018-12-02 ENCOUNTER — Ambulatory Visit (INDEPENDENT_AMBULATORY_CARE_PROVIDER_SITE_OTHER): Payer: Medicare Other | Admitting: Family Medicine

## 2018-12-02 VITALS — BP 123/56 | HR 66 | Temp 98.9°F | Resp 16 | Ht 71.0 in | Wt 213.6 lb

## 2018-12-02 DIAGNOSIS — E1142 Type 2 diabetes mellitus with diabetic polyneuropathy: Secondary | ICD-10-CM

## 2018-12-02 LAB — POCT GLYCOSYLATED HEMOGLOBIN (HGB A1C): Hemoglobin A1C: 7.3 % — AB (ref 4.0–5.6)

## 2018-12-02 NOTE — Patient Instructions (Addendum)
Thank you for coming to the office today.  Recent Labs    04/01/18 1055 06/24/18 0846 12/02/18 1339  HGBA1C 8.6* 8.1* 7.3*    Continue on Metformin, no new medicine now.  Keep up the good work.  DUE for FASTING BLOOD WORK (no food or drink after midnight before the lab appointment, only water or coffee without cream/sugar on the morning of)  SCHEDULE "Lab Only" visit in the morning at the clinic for lab draw in 3 MONTHS   - Make sure Lab Only appointment is at about 1 week before your next appointment, so that results will be available  For Lab Results, once available within 2-3 days of blood draw, you can can log in to MyChart online to view your results and a brief explanation. Also, we can discuss results at next follow-up visit.   Please schedule a Follow-up Appointment to: Return in about 3 months (around 03/04/2019) for 3 months for lab test A1c and then Virtual Phone visit DM.  If you have any other questions or concerns, please feel free to call the office or send a message through [name]. You may also schedule an earlier appointment if necessary.  Additionally, you may be receiving a survey about your experience at our office within a few days to 1 week by e-mail or mail. We value your feedback.  [name], DO [place]

## 2018-12-02 NOTE — Progress Notes (Signed)
Subjective:    Patient ID: Philip [name]., male    DOB: 1945-08-18, 73 y.o.   MRN: [id number]  Philip MASLOWSKI Sr. is a 73 y.o. male presenting on 12/02/2018 for Diabetes   HPI  CHRONIC DM, Type 2: Last visit 06/2018 for Diabetes, following with [facility], he has improved lifestyle.  see prior notes Today doing well - CBG readings improved 140-170 on average, some high readings 290-300s worse in evening - Other update, he has reduced his prednisone down to 5mg  every OTHER day now Meds:Metformin 1000mg  twice daily (500mg  tabs) - OFF Januvia, Bydureon, Lantus Reports good compliance. 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 - Next DM Eye Exam in March 2021 [place] Denies hypoglycemia   Depression screen [facility] 2/9 12/02/2018 07/01/2018 04/01/2018  Decreased Interest 0 0 0  Down, Depressed, Hopeless 0 0 0  PHQ - 2 Score 0 0 0  Altered sleeping - - -  Tired, decreased energy - - -  Change in appetite - - -  Feeling bad or failure about yourself  - - -  Trouble concentrating - - -  Moving slowly or fidgety/restless - - -  Suicidal thoughts - - -  PHQ-9 Score - - -  Difficult doing work/chores - - -    Social History   Tobacco Use  . Smoking status: Former Smoker    Packs/day: 1.00    Years: 8.00    Pack years: 8.00    Types: Cigarettes    Quit date: 12/31/1967    Years since quitting: 50.9  . Smokeless tobacco: Former Network engineer Use Topics  . Alcohol use: No    Alcohol/week: 0.0 standard drinks    Comment: 05/06/2012 "last alcohol several years ago; never had problem wit"  . Drug use: No    Review of Systems Per HPI unless specifically indicated above     Objective:    BP (!) 123/56   Pulse 66   Temp 98.9 F (37.2 C) (Oral)   Resp 16   Ht 5\' 11"  (1.803 m)   Wt 213 lb 9.6 oz (96.9 kg)   BMI 29.79 kg/m   Wt Readings from Last 3 Encounters:  12/02/18 213 lb 9.6 oz (96.9 kg)  09/10/18 217 lb (98.4 kg)  07/01/18 210 lb 6.4 oz (95.4 kg)    Physical Exam Vitals signs and nursing note reviewed.  Constitutional:      General: He is not in acute distress.    Appearance: He is well-developed. He is not diaphoretic.     Comments: Well-appearing, comfortable, cooperative  HENT:     Head: Normocephalic and atraumatic.  Eyes:     General:        Right eye: No discharge.        Left eye: No discharge.     Conjunctiva/sclera: Conjunctivae normal.  Cardiovascular:     Rate and Rhythm: Normal rate.  Pulmonary:     Effort: Pulmonary effort is normal.  Skin:    General: Skin is warm and dry.     Findings: No erythema or rash.  Neurological:     Mental Status: He is alert and oriented to person, place, and time.  Psychiatric:  Behavior: Behavior normal.     Comments: Well groomed, good eye contact, normal speech and thoughts      Diabetic Foot Exam - Simple   Simple Foot Form Diabetic Foot exam was performed with the following findings: Yes 12/02/2018  1:42 PM  Visual Inspection No deformities, no ulcerations, no other skin breakdown bilaterally: Yes Sensation Testing Intact to touch and monofilament testing bilaterally: Yes Pulse Check Posterior Tibialis and Dorsalis pulse intact bilaterally: Yes Comments     Recent Labs    04/01/18 1055 06/24/18 0846 12/02/18 1339  HGBA1C 8.6* 8.1* 7.3*     Results for orders placed or performed in visit on 12/02/18  POCT HgB A1C  Result Value Ref Range   Hemoglobin A1C 7.3 (A) 4.0 - 5.6 %      Assessment & Plan:   Problem List Items Addressed This Visit    Type 2 diabetes mellitus with peripheral neuropathy ([place]) - Primary    Significant improvement in T2DM control, with A1c down to 7.3  on improved lifestyle and continued metformin monotherapy. - No hypoglycemia - Concer chronic low dose prednisone now at lower regimen EVERY OTHER DAY - for COPD Complications - CKD-II, peripheral neuropathy, cataracts Remains OFF GLP1 Bydureon (allergy), and Lantus insulin, Januvia  Plan:  1. Continue Metformin 1000mg  BID (500mg  x 2 BID) 2. Encourage improved lifestyle - low carb, low sugar diet, reduce portion size, continue improving regular exercise 3. Check CBG, bring log to next visit for review 4. Continue ARB, Statin 5. Follow-up 3 months A1c lab and virtual visit check in, otherwise future can start spacing to q 6 months.  Continue to f/u with [name] for [facility]CM Pharmac. Future consider jardiance, as per CCM this should be covered option for him, he agrees to hold off now with improved A1c.      Relevant Orders   POCT HgB A1C (Completed)      No orders of the defined types were placed in this encounter.    Follow up plan: Return in about 3 months (around 03/04/2019) for 3 months for lab test A1c and then Virtual Phone visit DM.  Future lab to be ordered for A1c  [name], [facility] 12/02/2018, 1:36 PM

## 2018-12-02 NOTE — Assessment & Plan Note (Signed)
Significant improvement in T2DM control, with A1c down to 7.3 on improved lifestyle and continued metformin monotherapy. - No hypoglycemia - Concer chronic low dose prednisone now at lower regimen EVERY OTHER DAY - for COPD Complications - CKD-II, peripheral neuropathy, cataracts Remains OFF GLP1 Bydureon (allergy), and Lantus insulin, Januvia  Plan:  1. Continue Metformin 1000mg  BID (500mg  x 2 BID) 2. Encourage improved lifestyle - low carb, low sugar diet, reduce portion size, continue improving regular exercise 3. Check CBG, bring log to next visit for review 4. Continue ARB, Statin 5. Follow-up 3 months A1c lab and virtual visit check in, otherwise future can start spacing to q 6 months.  Continue to f/u with [facility] for CCM Pharmac. Future consider jardiance, as per CCM this should be covered option for him, he agrees to hold off now with improved A1c.

## 2018-12-03 ENCOUNTER — Other Ambulatory Visit: Payer: Self-pay | Admitting: Family Medicine

## 2018-12-03 DIAGNOSIS — E1142 Type 2 diabetes mellitus with diabetic polyneuropathy: Secondary | ICD-10-CM

## 2018-12-06 ENCOUNTER — Ambulatory Visit: Payer: Medicare Other | Admitting: Pharmacist

## 2018-12-06 DIAGNOSIS — I10 Essential (primary) hypertension: Secondary | ICD-10-CM

## 2018-12-06 DIAGNOSIS — J449 Chronic obstructive pulmonary disease, unspecified: Secondary | ICD-10-CM

## 2018-12-06 DIAGNOSIS — E1142 Type 2 diabetes mellitus with diabetic polyneuropathy: Secondary | ICD-10-CM

## 2018-12-06 NOTE — Chronic Care Management (AMB) (Signed)
Chronic Care Management   Follow Up Note   12/06/2018 Name: Philip POLLIO Sr. MRN: [id number] DOB: [birth year]  Referred by: [name], DO Reason for referral : Chronic Care Management (Patient Phone Call)   Philip Potters Sr. is a 73 y.o. year old male who is a primary care patient of [name], DO. The CCM team was consulted for assistance with chronic disease management and care coordination needs.  Philip [name] has a past medical history including but not limited to type 2 diabetes, CAD, atrial fibrillation, hypothyroidism, OSA, hyperlipidemia, hypertension, CKD, seasonal allergies and COPD.  I reached out to Philip Potters Sr. by phone today to follow up regarding medication management and diabetes management.  Review of patient status, including review of consultants reports, relevant laboratory and other test results, and collaboration with appropriate care team members and the patient's provider was performed as part of comprehensive patient evaluation and provision of chronic care management services.    Goals Addressed            This Visit's Progress   . Medication Management       Current Barriers:  . Dietary indiscretions - patient reports eating foods and beverages that he knows to drive up his blood sugar . Financial - currently in coverage gap of Medicare Part D plan  Pharmacist Clinical Goal(s):  [name] Over the next 30 days, patient will work with CM Pharmacist to address needs related to medication regimen optimization.  Interventions: . Perform chart review o 12/02/18, A1C: 7.3% . Provide patient with verbal diabetes education o Discuss with patient's dietary changes to improve his blood sugar control o Denies need for further written educational materials . Review recent blood sugar results (see results below) . Counsel patient on the importance of medication adherence o Confirms using maintenance and rescue inhalers appropriately as  prescribed . Counsel on medication assistance options, as [name] reports that he is currently in the coverage gap of his Medicare Part D coverage o Philip [name] denies interest in applying for patient assistance programs at this time, as he said that he is able to afford his medications by getting one month refills at a time and budgeting accordingly. . Philip [name] reports that he has an upper arm blood pressure monitor at home, but does not use it o Encourage patient to check blood pressure occasionally and record results in his log.  Patient Self Care Activities:  . Self administers medications as prescribed . Attends all scheduled provider appointments . Calls pharmacy for medication refills . Calls provider office for new concerns or questions  . Patient to check blood sugar regularly as directed and keep log Date Fasting Blood Glucose After Supper  (~2 hours)  1 - August 146 258  2 - August 178 176  3 - August 166 269  4 - August 152 320  5 - August 164 208  6 - August 176 226  7 - August 161   Average 163 243   . Patient will continue work on dietary modifications o Reducing portion sizes o Reducing total carbohydrate intake at each meal o Stopping eating when full, rather than when plate empty  Please see past updates related to this goal by clicking on the "Past Updates" button in the selected goal         Plan  The care management team will reach out to the patient again over the next 30 days.   [name], PharmD,  [place] [employer] [phone]

## 2018-12-06 NOTE — Patient Instructions (Signed)
Thank you allowing the Chronic Care Management Team to be a part of your care! It was a pleasure speaking with you today!     CCM (Chronic Care Management) Team    Janci Minor RN, BSN Nurse Care Coordinator  [phone]   [name] PharmD  Clinical Pharmacist  [phone]   Eula Fried LCSW Clinical Social Worker [phone]  Visit Information  Goals Addressed            This Visit's Progress   . Medication Management       Current Barriers:  . Dietary indiscretions - patient reports eating foods and beverages that he knows to drive up his blood sugar . Financial - currently in coverage gap of Medicare Part D plan  Pharmacist Clinical Goal(s):  [name] Over the next 30 days, patient will work with CM Pharmacist to address needs related to medication regimen optimization.  Interventions: . Perform chart review o 12/02/18, A1C: 7.3% . Provide patient with verbal diabetes education o Discuss with patient's dietary changes to improve his blood sugar control o Denies need for further written educational materials . Review recent blood sugar results (see results below) . Counsel patient on the importance of medication adherence o Confirms using maintenance and rescue inhalers appropriately as prescribed . Counsel on medication assistance options, as Mr. Gilkison reports that he is currently in the coverage gap of his Medicare Part D coverage o Mr. [place] denies interest in applying for patient assistance programs at this time, as he said that he is able to afford his medications by getting one month refills at a time and budgeting accordingly. . Mr. Loving reports that he has an upper arm blood pressure monitor at home, but does not use it o Encourage patient to check blood pressure occasionally and record results in his log.  Patient Self Care Activities:  . Self administers medications as prescribed . Attends all scheduled provider appointments . Calls pharmacy for  medication refills . Calls provider office for new concerns or questions  . Patient to check blood sugar regularly as directed and keep log Date Fasting Blood Glucose After Supper  (~2 hours)  1 - August 146 258  2 - August 178 176  3 - August 166 269  4 - August 152 320  5 - August 164 208  6 - August 176 226  7 - August 161   Average 163 243   . Patient will continue work on dietary modifications o Reducing portion sizes o Reducing total carbohydrate intake at each meal o Stopping eating when full, rather than when plate empty  Please see past updates related to this goal by clicking on the "Past Updates" button in the selected goal         The patient verbalized understanding of instructions provided today and declined a print copy of patient instruction materials.   The care management team will reach out to the patient again over the next 30 days.   [name], PharmD, [place] [employer] [phone]

## 2018-12-09 ENCOUNTER — Telehealth: Payer: Self-pay

## 2018-12-10 ENCOUNTER — Other Ambulatory Visit: Payer: Self-pay | Admitting: Family Medicine

## 2018-12-10 DIAGNOSIS — E1142 Type 2 diabetes mellitus with diabetic polyneuropathy: Secondary | ICD-10-CM

## 2018-12-12 ENCOUNTER — Telehealth: Payer: Self-pay

## 2018-12-19 ENCOUNTER — Telehealth: Payer: Self-pay

## 2018-12-23 ENCOUNTER — Other Ambulatory Visit: Payer: Self-pay | Admitting: Cardiovascular Disease

## 2018-12-23 NOTE — Telephone Encounter (Signed)
Please review for refill.  

## 2018-12-26 ENCOUNTER — Telehealth: Payer: Self-pay

## 2018-12-31 ENCOUNTER — Ambulatory Visit (INDEPENDENT_AMBULATORY_CARE_PROVIDER_SITE_OTHER): Payer: Medicare Other

## 2018-12-31 ENCOUNTER — Ambulatory Visit: Payer: Medicare Other

## 2018-12-31 ENCOUNTER — Other Ambulatory Visit: Payer: Self-pay

## 2018-12-31 ENCOUNTER — Other Ambulatory Visit: Payer: Self-pay | Admitting: Cardiovascular Disease

## 2018-12-31 VITALS — BP 133/80 | HR 72 | Temp 97.9°F | Ht 71.0 in | Wt 216.4 lb

## 2018-12-31 DIAGNOSIS — Z23 Encounter for immunization: Secondary | ICD-10-CM

## 2018-12-31 DIAGNOSIS — Z0000 Encounter for general adult medical examination without abnormal findings: Secondary | ICD-10-CM | POA: Diagnosis not present

## 2018-12-31 NOTE — Progress Notes (Signed)
Subjective:   Philip JOURDAN Sr. is a 73 y.o. male who presents for Medicare Annual/Subsequent preventive examination.  Review of Systems:   Cardiac Risk Factors include: advanced age (>30men, >87 women);male gender;hypertension;dyslipidemia;diabetes mellitus;obesity (BMI >30kg/m2)     Objective:    Vitals: BP 133/80 (BP Location: Left Arm, Patient Position: Sitting, Cuff Size: Normal)   Pulse 72   Temp 97.9 F (36.6 C) (Oral)   Ht 5\' 11"  (1.803 m)   Wt 216 lb 6.4 oz (98.2 kg)   SpO2 98%   BMI 30.18 kg/m   Body mass index is 30.18 kg/m.  Advanced Directives 12/31/2018 12/25/2017 10/03/2017 09/19/2016 07/23/2015 01/06/2015 05/06/2012  Does Patient Have a Medical Advance Directive? No No No No No Yes Patient does not have advance directive;Patient would not like information  Would patient like information on creating a medical advance directive? Yes (MAU/Ambulatory/Procedural Areas - Information given) No - Patient declined No - Patient declined Yes (MAU/Ambulatory/Procedural Areas - Information given) No - patient declined information - -  Pre-existing out of facility DNR order (yellow form or pink MOST form) - - - - - - No    Tobacco Social History   Tobacco Use  Smoking Status Former Smoker  . Packs/day: 1.00  . Years: 8.00  . Pack years: 8.00  . Types: Cigarettes  . Quit date: 12/31/1967  . Years since quitting: 51.0  Smokeless Tobacco Former Engineer, structural given: Not Answered   Clinical Intake:  Pre-visit preparation completed: Yes  Pain : No/denies pain     Nutritional Status: BMI > 30  Obese Nutritional Risks: None Diabetes: No  How often do you need to have someone help you when you read instructions, pamphlets, or other written materials from your doctor or pharmacy?: 1 - Never  Nutrition Risk Assessment:  Has the patient had any N/V/D within the last 2 months?  No  Does the patient have any non-healing wounds?  No  Has the patient had any unintentional  weight loss or weight gain?  No   Diabetes:  Is the patient diabetic?  Yes  If diabetic, was a CBG obtained today?  No  Did the patient bring in their glucometer from home?  No  How often do you monitor your CBG's?  BID   Financial Strains and Diabetes Management:  Are you having any financial strains with the device, your supplies or your medication? Yes .  Does the patient want to be seen by Chronic Care Management for management of their diabetes?  Yes  Would the patient like to be referred to a Nutritionist or for Diabetic Management?  Yes    Already in contact with CCM team.   Diabetic Exams:  Diabetic Eye Exam: Completed 07/08/2018.   Diabetic Foot Exam: Completed 12/02/2018.   Interpreter Needed?: No  Information entered by :: Jacqualin Shirkey,LPN  Past Medical History:  Diagnosis Date  . Arthritis    a. right knee  . Asthma   . Atrial arrhythmia    [place] of nonsustained atach  . 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 etiology.  . Coronary artery disease    a. 05/2012 Cath: severe 3VD-->CABG by Dr. Servando Snare in 05/2012 with LIMA to LAD, SVG to LCX & SVG to RPDA.  [name] Dysrhythmia    AFIB  . Edema    LEGS/ FEET  . History of bronchitis   . HOH (hard of hearing)   . Hypercholesteremia   .  Neuropathy   . Shingles   . Sleep apnea    NO CPAP   Past Surgical History:  Procedure Laterality Date  . ANTERIOR CERVICAL DECOMP/DISCECTOMY FUSION  ~ 2009  . BACK SURGERY     CERVICAL FUSION  . CARDIAC CATHETERIZATION  05/06/2012   Significant three-vessel coronary artery disease with normal ejection fraction  . CATARACT EXTRACTION W/PHACO Right 08/15/2017   Procedure: CATARACT EXTRACTION PHACO AND INTRAOCULAR LENS PLACEMENT (IOC);  Surgeon: Birder Robson, MD;  Location: [facility];  Service: Ophthalmology;  Laterality: Right;  [place] 00:23 AP% 13.3 CDE 3.11 Fluid pack lot # [id number]  . CATARACT EXTRACTION W/PHACO Left 10/03/2017   Procedure: CATARACT EXTRACTION PHACO AND  INTRAOCULAR LENS PLACEMENT (IOC);  Surgeon: Birder Robson, MD;  Location: [facility];  Service: Ophthalmology;  Laterality: Left;  Lot [id number] H [place]: 00:21.2 AP%:13.9 CDE: 2.96  . CORONARY ANGIOPLASTY    . CORONARY ARTERY BYPASS GRAFT  05/07/2012   Procedure: CORONARY ARTERY BYPASS GRAFTING (CABG);  Surgeon: [name], MD;  Location: [place];  Service: Open Heart Surgery;  Laterality: N/A;  times three  . INTRAOPERATIVE TRANSESOPHAGEAL ECHOCARDIOGRAM  05/07/2012   Procedure: INTRAOPERATIVE TRANSESOPHAGEAL ECHOCARDIOGRAM;  Surgeon: [name], MD;  Location: [place];  Service: Open Heart Surgery;  Laterality: N/A;  . KNEE ARTHROSCOPY  ~ 2000   "right" (05/06/2012)  . KNEE SURGERY  2003   right  . NECK SURGERY  2008   Plate in neck   Family History  Problem Relation Age of Onset  . Heart disease Mother   . Clotting disorder Mother   . Hypertension Sister    Social History   Socioeconomic History  . Marital status: Married    Spouse name: Not on file  . Number of children: Not on file  . Years of education: Not on file  . Highest education level: Some college, no degree  Occupational History  . Not on file  Social Needs  . Financial resource strain: Not hard at all  . Food insecurity    Worry: Never true    Inability: Never true  . Transportation needs    Medical: No    Non-medical: No  Tobacco Use  . Smoking status: Former Smoker    Packs/day: 1.00    Years: 8.00    Pack years: 8.00    Types: Cigarettes    Quit date: 12/31/1967    Years since quitting: 51.0  . Smokeless tobacco: Former Network engineer and Sexual Activity  . Alcohol use: No    Alcohol/week: 0.0 standard drinks    Comment: 05/06/2012 "last alcohol several years ago; never had problem wit"  . Drug use: No  . Sexual activity: Not Currently  Lifestyle  . Physical activity    Days per week: 0 days    Minutes per session: 0 min  . Stress: Not at all  Relationships  . Social connections    Talks  on phone: More than three times a week    Gets together: More than three times a week    Attends religious service: More than 4 times per year    Active member of club or organization: No    Attends meetings of clubs or organizations: Never    Relationship status: Married  Other Topics Concern  . Not on file  Social History Narrative   ** Merged History Encounter **          Working part time - not currently working because of  covid-19     Outpatient Encounter Medications as of 12/31/2018  Medication Sig  . albuterol (PROVENTIL) (2.5 MG/3ML) 0.083% nebulizer solution Inhale 3 mLs into the lungs every 6 (six) hours as needed for wheezing or shortness of breath.   . Albuterol Sulfate 108 (90 Base) MCG/ACT AEPB Inhale 2 puffs into the lungs every 6 (six) hours as needed (shortness of breath).   . Artificial Tear Ointment (DRY EYES OP) Place 1 drop into both eyes daily as needed (for dry eyes).  [name] atorvastatin (LIPITOR) 40 MG tablet Take 1 tablet by mouth once daily  . cetirizine (ZYRTEC) 10 MG tablet Take 10 mg by mouth daily.   . diclofenac sodium (VOLTAREN) 1 % GEL Apply 2 g topically 3 (three) times daily as needed (Use on elbow bursitis and knee).  [name] ELIQUIS 5 MG TABS tablet Take 1 tablet by mouth twice daily  . furosemide (LASIX) 40 MG tablet TAKE 1 TABLET BY MOUTH ONCE DAILY  . gabapentin (NEURONTIN) 100 MG capsule TAKE 1 CAPSULE BY MOUTH THREE TIMES DAILY  . guaiFENesin (MUCINEX) 600 MG 12 hr tablet Take 600 mg by mouth 2 (two) times daily as needed.  [name] LANCETS ULTRA THIN MISC 1 Device by Does not apply route 4 (four) times daily -  before meals and at bedtime.  [name] levothyroxine (SYNTHROID) 88 MCG tablet Take 1 tablet by mouth once daily  . losartan (COZAAR) 25 MG tablet Take 1 tablet by mouth once daily  . metFORMIN (GLUCOPHAGE) 500 MG tablet Take 2 tablets (1,000 mg total) by mouth 2 (two) times daily with a meal.  . metoprolol tartrate (LOPRESSOR) 25 MG tablet TAKE 1 TABLET BY  MOUTH TWICE DAILY  . montelukast (SINGULAIR) 10 MG tablet Take 10 mg by mouth at bedtime.   Glory Rosebush ULTRA test strip USE TEST STRIP(S) TO CHECK GLUCOSE TWICE DAILY  . SYMBICORT 160-4.5 MCG/ACT inhaler Inhale 2 puffs 2 (two) times daily into the lungs.   . predniSONE (DELTASONE) 5 MG tablet Take 5 mg by mouth every other day.    No facility-administered encounter medications on file as of 12/31/2018.     Activities of Daily Living In your present state of health, do you have any difficulty performing the following activities: 12/31/2018  Hearing? Y  Comment no hearing aids  Vision? N  Comment reading glasses  Difficulty concentrating or making decisions? Y  Comment typically comes back in a few minutes  Walking or climbing stairs? Y  Comment SOB due to COPD  Dressing or bathing? N  Doing errands, shopping? N  Preparing Food and eating ? N  Using the Toilet? N  In the past six months, have you accidently leaked urine? N  Do you have problems with loss of bowel control? N  Managing your Medications? N  Managing your Finances? N  Housekeeping or managing your Housekeeping? N  Some recent data might be hidden    Patient Care Team: [name], DO as PCP - General (Family Medicine) [name], MD as PCP - Cardiology (Cardiology) [name], MD as Consulting Physician (Cardiology) Leanor Kail, MD (Inactive) (Orthopedic Surgery) Erby Pian, MD as Referring Physician (Specialist) Dhalla, Virl Diamond, [facility] as Pharmacist Minor, Dalbert Garnet, RN as Case Manager   Assessment:   This is a routine wellness examination for [employer].  Exercise Activities and Dietary recommendations Current Exercise Habits: The patient does not participate in regular exercise at present, Exercise limited by: respiratory conditions(s)(COPD)  Goals    .  I would like to work on my COPD (pt-stated)     Current Barriers:  [name] Knowledge deficits related to basic understanding of  COPD disease process . Knowledge deficits related to basic COPD self care/management . Knowledge deficit related to basic understanding of how to use inhalers and how inhaled medications work   Case Manager Clinical Goal(s):  Over the next 90 days patient will report using inhalers as prescribed including rinsing mouth after use  Over the next 90 days patient will report utilizing pursed lip breathing for shortness of breath  Over the next 90 days, patient will be able to verbalize understanding of COPD action plan and when to seek appropriate levels of medical care  Over the next 90 days, patient will engage in lite exercise as tolerated to build/regain stamina and strength and reduce shortness of breath through activity tolerance  Over the next 90 days, patient will verbalize basic understanding of COPD disease process and self care activities  Over the next 90 days, patient will not be hospitalized for COPD exacerbation   Interventions:   Provided patient with basic written and verbal COPD education on self care/management/and exacerbation prevention   Provided patient with COPD action plan and reinforced importance of daily self assessment  Discussed Pulmonary Rehab and offered to assist with referral placement  Provided written and verbal instructions on pursed lip breathing and utilized returned demonstration as teach back  Provided instruction about proper use of medications used for management of COPD including inhalers  Advised patient to self assesses COPD action plan zone and make appointment with provider if in the yellow zone for 48 hours without improvement.   Pharmacy referral for medication review and teaching  Reviewed COPD education and information about pulmonary rehab cost, and process.    Patient Self Care Activities:  Takes medications as prescribed including inhalers  Engages in light exercise 3-5 days a week   Please see past updates related to this  goal by clicking on the "Past Updates" button in the selected goal      . Increase water intake     Recommend drinking 6-8 glasses of water a day.    . Medication Management     Current Barriers:  . Dietary indiscretions - patient reports eating foods and beverages that he knows to drive up his blood sugar . Financial - currently in coverage gap of Medicare Part D plan  Pharmacist Clinical Goal(s):  [name] Over the next 30 days, patient will work with CM Pharmacist to address needs related to medication regimen optimization.  Interventions: . Perform chart review o 12/02/18, A1C: 7.3% . Provide patient with verbal diabetes education o Discuss with patient's dietary changes to improve his blood sugar control o Denies need for further written educational materials . Review recent blood sugar results (see results below) . Counsel patient on the importance of medication adherence o Confirms using maintenance and rescue inhalers appropriately as prescribed . Counsel on medication assistance options, as Mr. Corneau reports that he is currently in the coverage gap of his Medicare Part D coverage o Mr. Laspada denies interest in applying for patient assistance programs at this time, as he said that he is able to afford his medications by getting one month refills at a time and budgeting accordingly. . Mr. Lowery reports that he has an upper arm blood pressure monitor at home, but does not use it o Encourage patient to check blood pressure occasionally and record results in his log.  Patient  Self Care Activities:  . Self administers medications as prescribed . Attends all scheduled provider appointments . Calls pharmacy for medication refills . Calls provider office for new concerns or questions  . Patient to check blood sugar regularly as directed and keep log Date Fasting Blood Glucose After Supper  (~2 hours)  1 - August 146 258  2 - August 178 176  3 - August 166 269  4 - August 152 320  5 -  August 164 208  6 - August 176 226  7 - August 161   Average 163 243   . Patient will continue work on dietary modifications o Reducing portion sizes o Reducing total carbohydrate intake at each meal o Stopping eating when full, rather than when plate empty  Please see past updates related to this goal by clicking on the "Past Updates" button in the selected goal      . Weight < 200 lb (90.719 kg)     Pt wants to loose 30 pounds within year.       Fall Risk Fall Risk  12/31/2018 12/02/2018 07/01/2018 04/01/2018 12/25/2017  Falls in the past year? 0 0 0 0 No  Number falls in past yr: - - - - -  Injury with Fall? - - - - -  Follow up - Falls evaluation completed Falls evaluation completed Falls evaluation completed -   FALL RISK PREVENTION PERTAINING TO THE HOME:  Any stairs in or around the home? Yes  If so, are there any without handrails? No   Home free of loose throw rugs in walkways, pet beds, electrical cords, etc? Yes  Adequate lighting in your home to reduce risk of falls? Yes   ASSISTIVE DEVICES UTILIZED TO PREVENT FALLS:  Life alert? No  Use of a cane, walker or w/c? No  Grab bars in the bathroom? No  Shower chair or bench in shower? No  Elevated toilet seat or a handicapped toilet? No    TIMED UP AND GO:  Was the test performed? Yes .  Length of time to ambulate 10 feet: 11 sec.   GAIT:  Appearance of gait: Gait steady and fast without the use of an assistive device.  Education: Fall risk prevention has been discussed.  Intervention(s) required? No   DME/home health order needed?  No    Depression Screen PHQ 2/9 Scores 12/31/2018 12/02/2018 07/01/2018 04/01/2018  PHQ - 2 Score 0 0 0 0  PHQ- 9 Score - - - -    Cognitive Function MMSE - Mini Mental State Exam 01/06/2015  Orientation to time 5  Orientation to Place 5  Registration 3  Attention/ Calculation 5  Recall 3  Language- name 2 objects 2  Language- repeat 1  Language- follow 3 step command 3   Language- read & follow direction 1  Write a sentence 1  Copy design 1  Total score 30     6CIT Screen 12/31/2018 12/25/2017 09/19/2016  What Year? 0 points 0 points 0 points  What month? 0 points 0 points 0 points  What time? 0 points 0 points 0 points  Count back from 20 0 points 0 points 0 points  Months in reverse 0 points 0 points 0 points  Repeat phrase 0 points 0 points 4 points  Total Score 0 0 4    Immunization History  Administered Date(s) Administered  . Fluad Quad(high Dose 65+) 12/31/2018  . Influenza, High Dose Seasonal PF 01/06/2015, 02/28/2017  . Influenza-Unspecified 02/08/2014  .  Pneumococcal Conjugate-13 01/02/2014  . Pneumococcal Polysaccharide-23 05/01/2012  . Tdap 05/01/2012    Qualifies for Shingles Vaccine? Yes  Zostavax completed n/a. Due for Shingrix. Education has been provided regarding the importance of this vaccine. Pt has been advised to call insurance company to determine out of pocket expense. Advised may also receive vaccine at local pharmacy or Health Dept. Verbalized acceptance and understanding.  Tdap: up to date   Flu Vaccine: Done today   Pneumococcal Vaccine: up to date   Screening Tests Health Maintenance  Topic Date Due  . INFLUENZA VACCINE  11/30/2018  . COLONOSCOPY  11/30/2018  . HEMOGLOBIN A1C  06/04/2019  . OPHTHALMOLOGY EXAM  07/08/2019  . FOOT EXAM  12/02/2019  . TETANUS/TDAP  05/01/2022  . Hepatitis C Screening  Completed  . PNA vac Low Risk Adult  Completed   Cancer Screenings:  Colorectal Screening: Completed 11/29/2013. Repeat every 5 years  Lung Cancer Screening: (Low Dose CT Chest recommended if Age 29-80 years, 30 pack-year currently smoking OR have quit w/in 15years.) does not qualify.    Additional Screening:  Hepatitis C Screening: does qualify; Completed 05/01/1993  Dental Screening: Recommended annual dental exams for proper oral hygiene  Community Resource Referral:  CRR required this visit?  No         Plan:  I have personally reviewed and addressed the Medicare Annual Wellness questionnaire and have noted the following in the patient's chart:  A. Medical and social history B. Use of alcohol, tobacco or illicit drugs  C. Current medications and supplements D. Functional ability and status E.  Nutritional status F.  Physical activity G. Advance directives H. List of other physicians I.  Hospitalizations, surgeries, and ER visits in previous 12 months J.  [place] such as hearing and vision if needed, cognitive and depression L. Referrals and appointments   In addition, I have reviewed and discussed with patient certain preventive protocols, quality metrics, and best practice recommendations. A written personalized care plan for preventive services as well as general preventive health recommendations were provided to patient.   Signed,   Bevelyn Ngo, LPN  [id number] Nurse Health Advisor  Nurse Notes: patient states about a week ago he noted a blood vessel busted in his right eye so he checked his BP and his diastolic number was in the 109 range. And it remained in the 90-100 range for about a week. He states the only other symptom he had that day was a slight headache but declined a headache any other day and no other symptoms.  He states it has been in the "normal range the last week" and his eye is back to normal. inquired if patient was doing anything out of his normal routine that day, he states he was working outside with his weed eater, which is something his son usually does for him. Patient declined needing follow up with PCP currently- he has a follow up scheduled for november, but will call if he notices his BP's high again or has any of the above symptoms again interm.

## 2018-12-31 NOTE — Patient Instructions (Signed)
Philip [name] , Thank you for taking time to come for your Medicare Wellness Visit. I appreciate your ongoing commitment to your health goals. Please review the following plan we discussed and let me know if I can assist you in the future.   Screening recommendations/referrals: Colonoscopy: completed 11/29/2013, call and schedule Recommended yearly ophthalmology/optometry visit for glaucoma screening and checkup Recommended yearly dental visit for hygiene and checkup  Vaccinations: Influenza vaccine: done today  Pneumococcal vaccine: up to date Tdap vaccine: up to date Shingles vaccine: shingrix eligible     Advanced directives: Advance directive discussed with you today. I have provided a copy for you to complete at home and have notarized. Once this is complete please bring a copy in to our office so we can scan it into your chart.  Conditions/risks identified: diabetic- in contact with chronic care management team.   Next appointment: Follow up in one year for your annual wellness visit   Preventive Care 49 Years and Older, Male Preventive care refers to lifestyle choices and visits with your health care provider that can promote health and wellness. What does preventive care include?  A yearly physical exam. This is also called an annual well check.  Dental exams once or twice a year.  Routine eye exams. Ask your health care provider how often you should have your eyes checked.  Personal lifestyle choices, including:  Daily care of your teeth and gums.  Regular physical activity.  Eating a healthy diet.  Avoiding tobacco and drug use.  Limiting alcohol use.  Practicing safe sex.  Taking low doses of aspirin every day.  Taking vitamin and mineral supplements as recommended by your health care provider. What happens during an annual well check? The services and screenings done by your health care provider during your annual well check will depend on your age, overall  health, lifestyle risk factors, and family history of disease. Counseling  Your health care provider may ask you questions about your:  Alcohol use.  Tobacco use.  Drug use.  Emotional well-being.  Home and relationship well-being.  Sexual activity.  Eating habits.  History of falls.  Memory and ability to understand (cognition).  Work and work Statistician. Screening  You may have the following tests or measurements:  Height, weight, and BMI.  Blood pressure.  Lipid and cholesterol levels. These may be checked every 5 years, or more frequently if you are over 66 years old.  Skin check.  Lung cancer screening. You may have this screening every year starting at age 39 if you have a 30-pack-year history of smoking and currently smoke or have quit within the past 15 years.  Fecal occult blood test (FOBT) of the stool. You may have this test every year starting at age 45.  Flexible sigmoidoscopy or colonoscopy. You may have a sigmoidoscopy every 5 years or a colonoscopy every 10 years starting at age 43.  Prostate cancer screening. Recommendations will vary depending on your family history and other risks.  Hepatitis C blood test.  Hepatitis B blood test.  Sexually transmitted disease (STD) testing.  Diabetes screening. This is done by checking your blood sugar (glucose) after you have not eaten for a while (fasting). You may have this done every 1-3 years.  Abdominal aortic aneurysm (AAA) screening. You may need this if you are a current or former smoker.  Osteoporosis. You may be screened starting at age 46 if you are at high risk. Talk with your health care provider about  your test results, treatment options, and if necessary, the need for more tests. Vaccines  Your health care provider may recommend certain vaccines, such as:  Influenza vaccine. This is recommended every year.  Tetanus, diphtheria, and acellular pertussis (Tdap, Td) vaccine. You may need a Td  booster every 10 years.  Zoster vaccine. You may need this after age 24.  Pneumococcal 13-valent conjugate (PCV13) vaccine. One dose is recommended after age 72.  Pneumococcal polysaccharide (PPSV23) vaccine. One dose is recommended after age 14. Talk to your health care provider about which screenings and vaccines you need and how often you need them. This information is not intended to replace advice given to you by your health care provider. Make sure you discuss any questions you have with your health care provider. Document Released: 05/14/2015 Document Revised: 01/05/2016 Document Reviewed: 02/16/2015 Elsevier Interactive Patient Education  2017 [place] Prevention in the Home Falls can cause injuries. They can happen to people of all ages. There are many things you can do to make your home safe and to help prevent falls. What can I do on the outside of my home?  Regularly fix the edges of walkways and driveways and fix any cracks.  Remove anything that might make you trip as you walk through a door, such as a raised step or threshold.  Trim any bushes or trees on the path to your home.  Use bright outdoor lighting.  Clear any walking paths of anything that might make someone trip, such as rocks or tools.  Regularly check to see if handrails are loose or broken. Make sure that both sides of any steps have handrails.  Any raised decks and porches should have guardrails on the edges.  Have any leaves, snow, or ice cleared regularly.  Use sand or salt on walking paths during winter.  Clean up any spills in your garage right away. This includes oil or grease spills. What can I do in the bathroom?  Use night lights.  Install grab bars by the toilet and in the tub and shower. Do not use towel bars as grab bars.  Use non-skid mats or decals in the tub or shower.  If you need to sit down in the shower, use a plastic, non-slip stool.  Keep the floor dry. Clean up  any water that spills on the floor as soon as it happens.  Remove soap buildup in the tub or shower regularly.  Attach bath mats securely with double-sided non-slip rug tape.  Do not have throw rugs and other things on the floor that can make you trip. What can I do in the bedroom?  Use night lights.  Make sure that you have a light by your bed that is easy to reach.  Do not use any sheets or blankets that are too big for your bed. They should not hang down onto the floor.  Have a firm chair that has side arms. You can use this for support while you get dressed.  Do not have throw rugs and other things on the floor that can make you trip. What can I do in the kitchen?  Clean up any spills right away.  Avoid walking on wet floors.  Keep items that you use a lot in easy-to-reach places.  If you need to reach something above you, use a strong step stool that has a grab bar.  Keep electrical cords out of the way.  Do not use floor polish or wax  that makes floors slippery. If you must use wax, use non-skid floor wax.  Do not have throw rugs and other things on the floor that can make you trip. What can I do with my stairs?  Do not leave any items on the stairs.  Make sure that there are handrails on both sides of the stairs and use them. Fix handrails that are broken or loose. Make sure that handrails are as long as the stairways.  Check any carpeting to make sure that it is firmly attached to the stairs. Fix any carpet that is loose or worn.  Avoid having throw rugs at the top or bottom of the stairs. If you do have throw rugs, attach them to the floor with carpet tape.  Make sure that you have a light switch at the top of the stairs and the bottom of the stairs. If you do not have them, ask someone to add them for you. What else can I do to help prevent falls?  Wear shoes that:  Do not have high heels.  Have rubber bottoms.  Are comfortable and fit you well.  Are  closed at the toe. Do not wear sandals.  If you use a stepladder:  Make sure that it is fully opened. Do not climb a closed stepladder.  Make sure that both sides of the stepladder are locked into place.  Ask someone to hold it for you, if possible.  Clearly mark and make sure that you can see:  Any grab bars or handrails.  First and last steps.  Where the edge of each step is.  Use tools that help you move around (mobility aids) if they are needed. These include:  Canes.  Walkers.  Scooters.  Crutches.  Turn on the lights when you go into a dark area. Replace any light bulbs as soon as they burn out.  Set up your furniture so you have a clear path. Avoid moving your furniture around.  If any of your floors are uneven, fix them.  If there are any pets around you, be aware of where they are.  Review your medicines with your doctor. Some medicines can make you feel dizzy. This can increase your chance of falling. Ask your doctor what other things that you can do to help prevent falls. This information is not intended to replace advice given to you by your health care provider. Make sure you discuss any questions you have with your health care provider. Document Released: 02/11/2009 Document Revised: 09/23/2015 Document Reviewed: 05/22/2014 Elsevier Interactive Patient Education  2017 [employer].

## 2019-01-02 ENCOUNTER — Telehealth: Payer: Self-pay

## 2019-01-03 ENCOUNTER — Ambulatory Visit: Payer: Self-pay | Admitting: Pharmacist

## 2019-01-03 DIAGNOSIS — E1142 Type 2 diabetes mellitus with diabetic polyneuropathy: Secondary | ICD-10-CM

## 2019-01-03 DIAGNOSIS — J449 Chronic obstructive pulmonary disease, unspecified: Secondary | ICD-10-CM

## 2019-01-03 NOTE — Patient Instructions (Signed)
Thank you allowing the Chronic Care Management Team to be a part of your care! It was a pleasure speaking with you today!     CCM (Chronic Care Management) Team    Janci Minor RN, BSN Nurse Care Coordinator  [phone]   [name] PharmD  Clinical Pharmacist  [phone]   Eula Fried LCSW Clinical Social Worker [phone]  Visit Information  Goals Addressed            This Visit's Progress   . PharmD - Medication Management       Current Barriers:  [name] Knowledge deficits related to dietary choices to improve blood sugar control . Financial - currently in coverage gap of Medicare Part D plan  Pharmacist Clinical Goal(s):  [name] Over the next 30 days, patient will work with CM Pharmacist to address needs related to medication regimen optimization.  Interventions: . Inquire about patient's COPD control o Reports current symtoms of increased shortness of breath and lower energy, which he attributes to being outside in humidity o Confirms using maintenance and rescue inhalers appropriately as prescribed o Denies increase/change in mucus severe shortness of breath or other red zone warning symptoms. o Encourage patient to continue to use respiratory medications as directed, stay indoors when possible and to call Pulmonologist if symptoms become worse or do not improve . Provide patient with verbal diabetes education o Discuss with patient's dietary changes to improve his blood sugar control - Identifying foods that raise his blood sugar and reducing portion sizes or avoiding these . Review recent blood sugar results (see results below) . Counsel patient on the importance of medication adherence . Reports started checking blood pressure as discussed. Reports that readings ~1.5 weeks ago were elevated (particularly diastolic), but did not record.  o Reports that readings were normal when he checked more recently o Encourage patient to check blood pressures and keep  log and to call Cardiologist for abnormal readings . Counsel patient on proper blood pressure monitoring technique  Patient Self Care Activities:  . Self administers medications as prescribed . Attends all scheduled provider appointments o Next appointment with PCP 03/12/19 o Next appointment with Pulmonologist 04/14/19 . Calls pharmacy for medication refills . Calls provider office for new concerns or questions  . Patient to check blood sugar regularly as directed and keep log Date Fasting Blood Glucose After Supper  (~2 hours)  28 - August 140 146  29 - August 138 180  30 - August 148 210  31 - August 156 298  1 - September 149 136  2 - September 136 293  3 - September 177 274  4 - September -   Average 149 220 .  Patient will continue work on dietary modifications o Reducing portion sizes o Reducing total carbohydrate intake at each meal o Stopping eating when full, rather than when plate empty  Please see past updates related to this goal by clicking on the "Past Updates" button in the selected goal         The patient verbalized understanding of instructions provided today and declined a print copy of patient instruction materials.   Telephone follow up appointment with care management team member scheduled for: 10/2 at 2 pm  [name], PharmD, [place] [phone]

## 2019-01-03 NOTE — Chronic Care Management (AMB) (Signed)
Chronic Care Management   Follow Up Note   01/03/2019 Name: Philip [name] Sr. MRN: [id number] DOB: [birth year]  Referred by: [name], DO Reason for referral : Chronic Care Management (Patient Phone Call)   Philip Potters Sr. is a 73 y.o. year old male who is a primary care patient of [name], DO. The CCM team was consulted for assistance with chronic disease management and care coordination needs. [name] has a past medical history including but not limited to type 2 diabetes, CAD, atrial fibrillation, hypothyroidism, OSA, hyperlipidemia, hypertension, CKD, seasonal allergies and COPD.  I reached out to Philip Potters Sr. by phone today to follow up regarding medication management and diabetes management.  Review of patient status, including review of consultants reports, relevant laboratory and other test results, and collaboration with appropriate care team members and the patient's provider was performed as part of comprehensive patient evaluation and provision of chronic care management services.     Outpatient Encounter Medications as of 01/03/2019  Medication Sig  . albuterol (PROVENTIL) (2.5 MG/3ML) 0.083% nebulizer solution Inhale 3 mLs into the lungs every 6 (six) hours as needed for wheezing or shortness of breath.   . Albuterol Sulfate 108 (90 Base) MCG/ACT AEPB Inhale 2 puffs into the lungs every 6 (six) hours as needed (shortness of breath).   [name] atorvastatin (LIPITOR) 40 MG tablet Take 1 tablet by mouth once daily  . cetirizine (ZYRTEC) 10 MG tablet Take 10 mg by mouth daily.   [name] ELIQUIS 5 MG TABS tablet Take 1 tablet by mouth twice daily  . furosemide (LASIX) 40 MG tablet TAKE 1 TABLET BY MOUTH ONCE DAILY. APPOINTMENT NEEDED FOR FURTHER REFILLS.  [name] losartan (COZAAR) 25 MG tablet Take 1 tablet by mouth once daily  . metFORMIN (GLUCOPHAGE) 500 MG tablet Take 2 tablets (1,000 mg total) by mouth 2 (two) times daily with a meal.  . metoprolol  tartrate (LOPRESSOR) 25 MG tablet TAKE 1 TABLET BY MOUTH TWICE DAILY  . montelukast (SINGULAIR) 10 MG tablet Take 10 mg by mouth at bedtime.   . predniSONE (DELTASONE) 5 MG tablet Take 5 mg by mouth every other day.   . SYMBICORT 160-4.5 MCG/ACT inhaler Inhale 2 puffs 2 (two) times daily into the lungs.   . Artificial Tear Ointment (DRY EYES OP) Place 1 drop into both eyes daily as needed (for dry eyes).  [name] diclofenac sodium (VOLTAREN) 1 % GEL Apply 2 g topically 3 (three) times daily as needed (Use on elbow bursitis and knee).  . gabapentin (NEURONTIN) 100 MG capsule TAKE 1 CAPSULE BY MOUTH THREE TIMES DAILY  . guaiFENesin (MUCINEX) 600 MG 12 hr tablet Take 600 mg by mouth 2 (two) times daily as needed.  [name] LANCETS ULTRA THIN MISC 1 Device by Does not apply route 4 (four) times daily -  before meals and at bedtime.  [name] levothyroxine (SYNTHROID) 88 MCG tablet Take 1 tablet by mouth once daily  . ONETOUCH ULTRA test strip USE TEST STRIP(S) TO CHECK GLUCOSE TWICE DAILY   No facility-administered encounter medications on file as of 01/03/2019.     Goals Addressed            This Visit's Progress   . PharmD - Medication Management       Current Barriers:  [name] Knowledge deficits related to dietary choices to improve blood sugar control . Financial - currently in coverage gap of Medicare Part D plan  Pharmacist Clinical Goal(s):  .  Over the next 30 days, patient will work with CM Pharmacist to address needs related to medication regimen optimization.  Interventions: . Inquire about patient's COPD control o Reports current symtoms of increased shortness of breath and lower energy, which he attributes to being outside in humidity o Confirms using maintenance and rescue inhalers appropriately as prescribed o Denies increase/change in mucus severe shortness of breath or other red zone warning symptoms. o Encourage patient to continue to use respiratory medications as directed, stay indoors when  possible and to call Pulmonologist if symptoms become worse or do not improve . Provide patient with verbal diabetes education o Discuss with patient's dietary changes to improve his blood sugar control - Identifying foods that raise his blood sugar and reducing portion sizes or avoiding these . Review recent blood sugar results (see results below) . Counsel patient on the importance of medication adherence . Reports started checking blood pressure as discussed. Reports that readings ~1.5 weeks ago were elevated (particularly diastolic), but did not record.  o Reports that readings were normal when he checked more recently o Encourage patient to check blood pressures and keep log and to call Cardiologist for abnormal readings . Counsel patient on proper blood pressure monitoring technique  Patient Self Care Activities:  . Self administers medications as prescribed . Attends all scheduled provider appointments o Next appointment with PCP 03/12/19 o Next appointment with Pulmonologist 04/14/19 . Calls pharmacy for medication refills . Calls provider office for new concerns or questions  . Patient to check blood sugar regularly as directed and keep log Date Fasting Blood Glucose After Supper  (~2 hours)  28 - August 140 146  29 - August 138 180  30 - August 148 210  31 - August 156 298  1 - September 149 136  2 - September 136 293  3 - September 177 274  4 - September -   Average 149 220 .  Patient will continue work on dietary modifications o Reducing portion sizes o Reducing total carbohydrate intake at each meal o Stopping eating when full, rather than when plate empty  Please see past updates related to this goal by clicking on the "Past Updates" button in the selected goal         Plan  Telephone follow up appointment with care management team member scheduled for: 10/2 at 2 pm  [name], PharmD, [place] [phone]

## 2019-01-09 ENCOUNTER — Telehealth: Payer: Self-pay

## 2019-01-16 ENCOUNTER — Ambulatory Visit: Payer: Self-pay | Admitting: *Deleted

## 2019-01-16 ENCOUNTER — Ambulatory Visit (INDEPENDENT_AMBULATORY_CARE_PROVIDER_SITE_OTHER): Payer: Medicare Other | Admitting: *Deleted

## 2019-01-16 DIAGNOSIS — J449 Chronic obstructive pulmonary disease, unspecified: Secondary | ICD-10-CM

## 2019-01-16 DIAGNOSIS — E1142 Type 2 diabetes mellitus with diabetic polyneuropathy: Secondary | ICD-10-CM

## 2019-01-16 NOTE — Chronic Care Management (AMB) (Signed)
Chronic Care Management   Follow Up Note   01/16/2019 Name: Philip [name]. MRN: [id number] DOB: [birth year]  Referred by: Philip Hauser, DO Reason for referral : Chronic Care Management (COPD )   Philip Potters [name]. is a 73 y.o. year old male who is a primary care patient of Philip Hauser, DO. The CCM team was consulted for assistance with chronic disease management and care coordination needs.    Review of patient status, including review of consultants reports, relevant laboratory and other test results, and collaboration with appropriate care team members and the patient's provider was performed as part of comprehensive patient evaluation and provision of chronic care management services.    SDOH (Social Determinants of Health) screening performed today: Physical Activity. See Care Plan for related entries.   Advanced Directives Status: N See Care Plan and Vynca application for related entries.  Outpatient Encounter Medications as of 01/16/2019  Medication Sig  . albuterol (PROVENTIL) (2.5 MG/3ML) 0.083% nebulizer solution Inhale 3 mLs into the lungs every 6 (six) hours as needed for wheezing or shortness of breath.   . Albuterol Sulfate 108 (90 Base) MCG/ACT AEPB Inhale 2 puffs into the lungs every 6 (six) hours as needed (shortness of breath).   . Artificial Tear Ointment (DRY EYES OP) Place 1 drop into both eyes daily as needed (for dry eyes).  Philip [name] atorvastatin (LIPITOR) 40 MG tablet Take 1 tablet by mouth once daily  . cetirizine (ZYRTEC) 10 MG tablet Take 10 mg by mouth daily.   . diclofenac sodium (VOLTAREN) 1 % GEL Apply 2 g topically 3 (three) times daily as needed (Use on elbow bursitis and knee).  Philip [name] ELIQUIS 5 MG TABS tablet Take 1 tablet by mouth twice daily  . furosemide (LASIX) 40 MG tablet TAKE 1 TABLET BY MOUTH ONCE DAILY. APPOINTMENT NEEDED FOR FURTHER REFILLS.  Philip [name] gabapentin (NEURONTIN) 100 MG capsule TAKE 1 CAPSULE BY MOUTH THREE TIMES DAILY  .  guaiFENesin (MUCINEX) 600 MG 12 hr tablet Take 600 mg by mouth 2 (two) times daily as needed.  Philip [name] LANCETS ULTRA THIN MISC 1 Device by Does not apply route 4 (four) times daily -  before meals and at bedtime.  Philip [name] levothyroxine (SYNTHROID) 88 MCG tablet Take 1 tablet by mouth once daily  . losartan (COZAAR) 25 MG tablet Take 1 tablet by mouth once daily  . metFORMIN (GLUCOPHAGE) 500 MG tablet Take 2 tablets (1,000 mg total) by mouth 2 (two) times daily with a meal.  . metoprolol tartrate (LOPRESSOR) 25 MG tablet TAKE 1 TABLET BY MOUTH TWICE DAILY  . montelukast (SINGULAIR) 10 MG tablet Take 10 mg by mouth at bedtime.   Philip [name] ULTRA test strip USE TEST STRIP(S) TO CHECK GLUCOSE TWICE DAILY  . predniSONE (DELTASONE) 5 MG tablet Take 5 mg by mouth every other day.   . SYMBICORT 160-4.5 MCG/ACT inhaler Inhale 2 puffs 2 (two) times daily into the lungs.    No facility-administered encounter medications on file as of 01/16/2019.      Goals Addressed            This Visit's Progress   . RN-I would like to work on my COPD (pt-stated)       Current Barriers:  Philip [name] Knowledge deficits related to basic understanding of COPD disease process . Knowledge deficits related to basic COPD self care/management . Knowledge deficit related to basic understanding of how to use inhalers and how inhaled medications work  Case Manager Clinical Goal(s):  Over the next 90 days patient will report using inhalers as prescribed including rinsing mouth after use  Over the next 90 days patient will report utilizing pursed lip breathing for shortness of breath  Over the next 90 days, patient will be able to verbalize understanding of COPD action plan and when to seek appropriate levels of medical care  Over the next 90 days, patient will engage in lite exercise as tolerated to build/regain stamina and strength and reduce shortness of breath through activity tolerance  Over the next 90 days, patient will verbalize  basic understanding of COPD disease process and self care activities  Over the next 90 days, patient will not be hospitalized for COPD exacerbation   Interventions:   Provided patient with basic written and verbal COPD education on self care/management/and exacerbation prevention   Provided patient with COPD action plan and reinforced importance of daily self assessment  Discussed Pulmonary Rehab and offered to assist with referral placement  Provided instruction about proper use of medications used for management of COPD including inhalers  Advised patient to self assesses COPD action plan zone and make appointment with provider if in the yellow zone for 48 hours without improvement.  Provided patient with education about the role of exercise in the management of COPD  Advised patient to engage in light exercise as tolerated 3-5 days a week  Provided education about and advised patient to utilize infection prevention strategies to reduce risk of respiratory infection    Reviewed COPD education and information about pulmonary rehab being open and benefits-Patient continues to want to wait on this. Although patient admits he is unable to participate in much exercise besides walking his dog related to activity intolerance   Patient stated he has had a few bad days over the last month, he explains he has increased mucus and just can not get it coughed up at times.   Reviewed with patient upcoming appts: PCP 11/11 and Pulmonary Dec 14.   Reviewed with patient ACP needs, patient stated he plans to have his son assist him with this.   Patient was encouraged on his A1C improvement  Patient Self Care Activities:  Takes medications as prescribed including inhalers  Engages in light exercise 3-5 days a week   Please see past updates related to this goal by clicking on the "Past Updates" button in the selected goal          The care management team will reach out to the patient again  over the next 30 days.  The patient has been provided with contact information for the care management team and has been advised to call with any health related questions or concerns.    Merlene Morse Abass Misener RN, BSN Nurse Case Pharmacist, [facility]  [phone]) [employer]

## 2019-01-16 NOTE — Patient Instructions (Signed)
Thank you allowing the Chronic Care Management Team to be a part of your care! It was a pleasure speaking with you today!   CCM (Chronic Care Management) Team   Kamee Bobst RN, BSN Nurse Care Coordinator  [phone]  [name] PharmD  Clinical Pharmacist  [phone]  Eula Fried LCSW Clinical Social Worker [phone]  Goals Addressed            This Visit's Progress   . RN-I would like to work on my COPD (pt-stated)       Current Barriers:  [name] Knowledge deficits related to basic understanding of COPD disease process . Knowledge deficits related to basic COPD self care/management . Knowledge deficit related to basic understanding of how to use inhalers and how inhaled medications work   Case Manager Clinical Goal(s):  Over the next 90 days patient will report using inhalers as prescribed including rinsing mouth after use  Over the next 90 days patient will report utilizing pursed lip breathing for shortness of breath  Over the next 90 days, patient will be able to verbalize understanding of COPD action plan and when to seek appropriate levels of medical care  Over the next 90 days, patient will engage in lite exercise as tolerated to build/regain stamina and strength and reduce shortness of breath through activity tolerance  Over the next 90 days, patient will verbalize basic understanding of COPD disease process and self care activities  Over the next 90 days, patient will not be hospitalized for COPD exacerbation   Interventions:   Provided patient with basic written and verbal COPD education on self care/management/and exacerbation prevention   Provided patient with COPD action plan and reinforced importance of daily self assessment  Discussed Pulmonary Rehab and offered to assist with referral placement  Provided instruction about proper use of medications used for management of COPD including inhalers  Advised patient to self assesses COPD  action plan zone and make appointment with provider if in the yellow zone for 48 hours without improvement.  Provided patient with education about the role of exercise in the management of COPD  Advised patient to engage in light exercise as tolerated 3-5 days a week  Provided education about and advised patient to utilize infection prevention strategies to reduce risk of respiratory infection    Reviewed COPD education and information about pulmonary rehab being open and benefits-Patient continues to want to wait on this. Although patient admits he is unable to participate in much exercise besides walking his dog related to activity intolerance   Patient stated he has had a few bad days over the last month, he explains he has increased mucus and just can not get it coughed up at times.   Reviewed with patient upcoming appts: PCP 11/11 and Pulmonary Dec 14.   Reviewed with patient ACP needs, patient stated he plans to have his son assist him with this.   Patient was encouraged on his A1C improvement  Patient Self Care Activities:  Takes medications as prescribed including inhalers  Engages in light exercise 3-5 days a week   Please see past updates related to this goal by clicking on the "Past Updates" button in the selected goal         The patient verbalized understanding of instructions provided today and declined a print copy of patient instruction materials.   The patient has been provided with contact information for the care management team and has been advised to call with any health related  questions or concerns.

## 2019-01-16 NOTE — Chronic Care Management (AMB) (Signed)
  Chronic Care Management   Outreach Note  01/16/2019 Name: Philip PUSKARICH Sr. MRN: [id number] DOB: 1945/08/16  Referred by: [name], DO Reason for referral : Chronic Care Management (Unsuccessful outreach X1)   An unsuccessful telephone outreach was attempted today. The patient was referred to the case management team by for assistance with chronic care management and care coordination.   Follow Up Plan: A HIPPA compliant phone message was UNABLE to be left for the patient providing contact information and requesting a return call.  The care management team will reach out to the patient again over the next 30 days.   Merlene Morse Leiliana Foody RN, BSN Nurse Case Pharmacist, [facility]  [phone]) [employer]

## 2019-01-31 ENCOUNTER — Ambulatory Visit (INDEPENDENT_AMBULATORY_CARE_PROVIDER_SITE_OTHER): Payer: Medicare Other | Admitting: Pharmacist

## 2019-01-31 DIAGNOSIS — I129 Hypertensive chronic kidney disease with stage 1 through stage 4 chronic kidney disease, or unspecified chronic kidney disease: Secondary | ICD-10-CM

## 2019-01-31 DIAGNOSIS — E1142 Type 2 diabetes mellitus with diabetic polyneuropathy: Secondary | ICD-10-CM

## 2019-01-31 DIAGNOSIS — N182 Chronic kidney disease, stage 2 (mild): Secondary | ICD-10-CM

## 2019-01-31 NOTE — Patient Instructions (Signed)
Thank you allowing the Chronic Care Management Team to be a part of your care! It was a pleasure speaking with you today!     CCM (Chronic Care Management) Team    Janci Minor RN, BSN Nurse Care Coordinator  [phone]   [name] PharmD  Clinical Pharmacist  [phone]   Eula Fried LCSW Clinical Social Worker [phone]  Visit Information  Goals Addressed            This Visit's Progress   . PharmD - Medication Management       Current Barriers:  [name] Knowledge deficits related to dietary choices to improve blood sugar control . Financial - currently in coverage gap of Medicare Part D plan  Pharmacist Clinical Goal(s):  [name] Over the next 30 days, patient will work with CM Pharmacist to address needs related to medication regimen optimization.  Interventions: . Inquire about patient's COPD control o Reports breathing improved since we last spoke, back to his normal o Confirms taking respiratory medications, including inhalers, as directed . Counsel on importance of blood sugar monitoring and control o Mr. Topel reports that he has not been checking his blood sugars as regularly due to a vacation and then his wife being in the hospital from an injury. o Reports that when he has checked, fasting morning CBGs have ranged: 130s-160s . Counsel on role of diet and exercise in blood sugar and blood pressure control o Mr. Kiessling reports that he has been focusing on limiting his carbohydrate portion sizes o Reports that he has been working on increasing his exercise by taking walks. Reports also being more active as he is caring for his wife. [name] on importance of blood pressure monitoring o Reports having obtained a new blood pressure monitor o Counsel patient on proper blood pressure monitoring technique o Reports BP 131/92 when he last checked on 9/23  Patient Self Care Activities:  . Self administers medications as prescribed . Attends all scheduled  provider appointments o Next appointment with PCP 03/12/19 o Next appointment with Pulmonologist 04/14/19 . Calls pharmacy for medication refills . Calls provider office for new concerns or questions  . Patient to check blood sugar regularly as directed and keep log . Patient will continue work on dietary modifications o Reducing portion sizes o Reducing total carbohydrate intake at each meal o Stopping eating when full, rather than when plate empty  Please see past updates related to this goal by clicking on the "Past Updates" button in the selected goal         The patient verbalized understanding of instructions provided today and declined a print copy of patient instruction materials.   Telephone follow up appointment with care management team member scheduled for: 10/16 at 2 pm  [name], PharmD, [place] [phone]

## 2019-01-31 NOTE — Chronic Care Management (AMB) (Signed)
Chronic Care Management   Follow Up Note   01/31/2019 Name: Philip IACCARINO Sr. MRN: [id number] DOB: 08/10/1945  Referred by: Philip [name] Reason for referral : Chronic Care Management (Patient Phone Call)   Philip Potters Sr. is a 73 y.o. year old male who is a primary care patient of Philip [name]. The CCM team was consulted for assistance with chronic disease management and care coordination needs.  Philip [name] including but not limited to type 2 diabetes, CAD, atrial fibrillation, hypothyroidism, OSA, hyperlipidemia, hypertension, CKD, seasonal allergies and COPD.  I reached out to Philip Potters Sr. by phone today.   Review of patient status, including review of consultants reports, relevant laboratory and other test results, and collaboration with appropriate care team members and the patient's provider was performed as part of comprehensive patient evaluation and provision of chronic care management services.     Outpatient Encounter Medications as of 01/31/2019  Medication Sig  . albuterol (PROVENTIL) (2.5 MG/3ML) 0.083% nebulizer solution Inhale 3 mLs into the lungs every 6 (six) hours as needed for wheezing or shortness of breath.   . Albuterol Sulfate 108 (90 Base) MCG/ACT AEPB Inhale 2 puffs into the lungs every 6 (six) hours as needed (shortness of breath).   . cetirizine (ZYRTEC) 10 MG tablet Take 10 mg by mouth daily.   . metFORMIN (GLUCOPHAGE) 500 MG tablet Take 2 tablets (1,000 mg total) by mouth 2 (two) times daily with a meal.  . montelukast (SINGULAIR) 10 MG tablet Take 10 mg by mouth at bedtime.   . predniSONE (DELTASONE) 5 MG tablet Take 5 mg by mouth every other day.   . SYMBICORT 160-4.5 MCG/ACT inhaler Inhale 2 puffs 2 (two) times daily into the lungs.   . Artificial Tear Ointment (DRY EYES OP) Place 1 drop into both eyes daily as needed (for dry eyes).  [name] atorvastatin (LIPITOR) 40 MG tablet Take 1 tablet by  mouth once daily  . diclofenac sodium (VOLTAREN) 1 % GEL Apply 2 g topically 3 (three) times daily as needed (Use on elbow bursitis and knee).  [name] ELIQUIS 5 MG TABS tablet Take 1 tablet by mouth twice daily  . furosemide (LASIX) 40 MG tablet TAKE 1 TABLET BY MOUTH ONCE DAILY. APPOINTMENT NEEDED FOR FURTHER REFILLS.  [name] gabapentin (NEURONTIN) 100 MG capsule TAKE 1 CAPSULE BY MOUTH THREE TIMES DAILY  . guaiFENesin (MUCINEX) 600 MG 12 hr tablet Take 600 mg by mouth 2 (two) times daily as needed.  [name] LANCETS ULTRA THIN MISC 1 Device by Does not apply route 4 (four) times daily -  before meals and at bedtime.  [name] levothyroxine (SYNTHROID) 88 MCG tablet Take 1 tablet by mouth once daily  . losartan (COZAAR) 25 MG tablet Take 1 tablet by mouth once daily  . metoprolol tartrate (LOPRESSOR) 25 MG tablet TAKE 1 TABLET BY MOUTH TWICE DAILY  . ONETOUCH ULTRA test strip USE TEST STRIP(S) TO CHECK GLUCOSE TWICE DAILY   No facility-administered encounter medications on file as of 01/31/2019.     Goals Addressed            This Visit's Progress   . PharmD - Medication Management       Current Barriers:  [name] Knowledge deficits related to dietary choices to improve blood sugar control . Financial - currently in coverage gap of Medicare Part D plan  Pharmacist Clinical Goal(s):  [name] Over the next 30 days, patient  will work with CM Pharmacist to address needs related to medication regimen optimization.  Interventions: . Inquire about patient's COPD control o Reports breathing improved since we last spoke, back to his normal o Confirms taking respiratory medications, including inhalers, as directed . Counsel on importance of blood sugar monitoring and control o Philip [name] reports that he has not been checking his blood sugars as regularly due to a vacation and then his wife being in the hospital from an injury. o Reports that when he has checked, fasting morning CBGs have ranged: 130s-160s . Counsel on role of  diet and exercise in blood sugar and blood pressure control o Philip [name] reports that he has been focusing on limiting his carbohydrate portion sizes o Reports that he has been working on increasing his exercise by taking walks. Reports also being more active as he is caring for his wife. [name] on importance of blood pressure monitoring o Reports having obtained a new blood pressure monitor o Counsel patient on proper blood pressure monitoring technique o Reports BP 131/92 when he last checked on 9/23  Patient Self Care Activities:  . Self administers medications as prescribed . Attends all scheduled provider appointments o Next appointment with PCP 03/12/19 o Next appointment with Pulmonologist 04/14/19 . Calls pharmacy for medication refills . Calls provider office for new concerns or questions  . Patient to check blood sugar regularly as directed and keep log . Patient will continue work on dietary modifications o Reducing portion sizes o Reducing total carbohydrate intake at each meal o Stopping eating when full, rather than when plate empty  Please see past updates related to this goal by clicking on the "Past Updates" button in the selected goal         Plan  Telephone follow up appointment with care management team member scheduled for: 10/16 at 2 pm  [name], PharmD, [place] [phone]

## 2019-02-14 ENCOUNTER — Ambulatory Visit: Payer: Self-pay | Admitting: Pharmacist

## 2019-02-14 DIAGNOSIS — E1142 Type 2 diabetes mellitus with diabetic polyneuropathy: Secondary | ICD-10-CM

## 2019-02-14 DIAGNOSIS — J432 Centrilobular emphysema: Secondary | ICD-10-CM

## 2019-02-14 DIAGNOSIS — I10 Essential (primary) hypertension: Secondary | ICD-10-CM

## 2019-02-14 NOTE — Patient Instructions (Signed)
Thank you allowing the Chronic Care Management Team to be a part of your care! It was a pleasure speaking with you today!     CCM (Chronic Care Management) Team    Janci Minor RN, BSN Nurse Care Coordinator  [phone]   [name] PharmD  Clinical Pharmacist  [phone]   Eula Fried LCSW Clinical Social Worker [phone]  Visit Information  Goals Addressed            This Visit's Progress   . PharmD - Medication Management       Current Barriers:  [name] Knowledge deficits related to dietary choices to improve blood sugar control . Financial - currently in coverage gap of Medicare Part D plan  Pharmacist Clinical Goal(s):  [name] Over the next 30 days, patient will work with CM Pharmacist to address needs related to medication regimen optimization.  Interventions: . Inquire about patient's COPD control o Reports breathing continues to be harder with exertion (as he is caring for his wife) or exposure to triggers, but at his normal o Provide patient with basic verbal COPD education on self care/management/and exacerbation prevention.  - Review importance of calling provider for Yellow Zone symtpms o Confirms taking respiratory medications, including inhalers, as directed . Counsel on importance of blood sugar monitoring and control o Review recent blood sugars with patient (see below) o Denies any missed doses of metformin o Discuss correlation between elevated blood sugar readings and dietary choices (dietary indiscretions when eating out, dessert choices and portion size, etc) o Encourage patient to continue to focus on controlling carbohydrate portion sizes . Counsel on importance of blood pressure control o Reports has been monitoring blood pressure with new monitor (see below)  Patient Self Care Activities:  . Self administers medications as prescribed . Attends all scheduled provider appointments o Next appointment with PCP 03/12/19 o Next appointment  with Pulmonologist 04/14/19 . Calls pharmacy for medication refills . Calls provider office for new concerns or questions  . Patient to check blood sugar regularly as directed and keep log Date Fasting Blood Glucose After Supper  (~2 hours) Notes  10 - October 117 137   11 - October 120 200   12 - October 178 345* *Reports large portion sizes of carbohydrates at both lunch and supper  13 - October 180 -   14 - October 135 175   15 - October 169 135   16 - October 178 -    . Patient to keep log when checks blood pressure Date AM BP PM BP  10 - October 127/86, HR 71 104/71, HR 79  11 - October 123/90, HR 91 117/72, HR 85  12 - October 124/84, HR 88 113/77, HR 87  13 - October 106/67, HR 77 -  14 - October 130/92, HR 82 124/84, HR 87  15 - October 105/73, HR 62 132/70, HR 59  16 - October 109/79, HR 81 -   . Patient will continue work on dietary modifications o Reducing portion sizes o Reducing total carbohydrate intake at each meal o Stopping eating when full, rather than when plate empty  Please see past updates related to this goal by clicking on the "Past Updates" button in the selected goal         The patient verbalized understanding of instructions provided today and declined a print copy of patient instruction materials.   The care management team will reach out to the patient again over the next 30 days.  [name], PharmD, [place] [employer] [phone]

## 2019-02-14 NOTE — Chronic Care Management (AMB) (Signed)
Chronic Care Management   Follow Up Note   02/14/2019 Name: Philip [name] Sr. MRN: [id number] DOB: 1945/11/11  Referred by: [name], DO Reason for referral : Chronic Care Management (Patient Phone Call)   Philip Potters Sr. is a 73 y.o. year old male who is a primary care patient of [name], DO. The CCM team was consulted for assistance with chronic disease management and care coordination needs.  [name] has a past medical history including but not limited to type 2 diabetes, CAD, atrial fibrillation, hypothyroidism, OSA, hyperlipidemia, hypertension, CKD, seasonal allergies and COPD.  I reached out to Philip Potters Sr. by phone today.   Review of patient status, including review of consultants reports, relevant laboratory and other test results, and collaboration with appropriate care team members and the patient's provider was performed as part of comprehensive patient evaluation and provision of chronic care management services.     Outpatient Encounter Medications as of 02/14/2019  Medication Sig  . Albuterol Sulfate 108 (90 Base) MCG/ACT AEPB Inhale 2 puffs into the lungs every 6 (six) hours as needed (shortness of breath).   . cetirizine (ZYRTEC) 10 MG tablet Take 10 mg by mouth daily.   . metFORMIN (GLUCOPHAGE) 500 MG tablet Take 2 tablets (1,000 mg total) by mouth 2 (two) times daily with a meal.  . montelukast (SINGULAIR) 10 MG tablet Take 10 mg by mouth at bedtime.   . predniSONE (DELTASONE) 5 MG tablet Take 5 mg by mouth every other day.   . SYMBICORT 160-4.5 MCG/ACT inhaler Inhale 2 puffs 2 (two) times daily into the lungs.   [name] albuterol (PROVENTIL) (2.5 MG/3ML) 0.083% nebulizer solution Inhale 3 mLs into the lungs every 6 (six) hours as needed for wheezing or shortness of breath.   . Artificial Tear Ointment (DRY EYES OP) Place 1 drop into both eyes daily as needed (for dry eyes).  [name] atorvastatin (LIPITOR) 40 MG tablet Take 1 tablet by  mouth once daily  . diclofenac sodium (VOLTAREN) 1 % GEL Apply 2 g topically 3 (three) times daily as needed (Use on elbow bursitis and knee).  [name] ELIQUIS 5 MG TABS tablet Take 1 tablet by mouth twice daily  . furosemide (LASIX) 40 MG tablet TAKE 1 TABLET BY MOUTH ONCE DAILY. APPOINTMENT NEEDED FOR FURTHER REFILLS.  [name] gabapentin (NEURONTIN) 100 MG capsule TAKE 1 CAPSULE BY MOUTH THREE TIMES DAILY  . guaiFENesin (MUCINEX) 600 MG 12 hr tablet Take 600 mg by mouth 2 (two) times daily as needed.  [name] LANCETS ULTRA THIN MISC 1 Device by Does not apply route 4 (four) times daily -  before meals and at bedtime.  [name] levothyroxine (SYNTHROID) 88 MCG tablet Take 1 tablet by mouth once daily  . losartan (COZAAR) 25 MG tablet Take 1 tablet by mouth once daily  . metoprolol tartrate (LOPRESSOR) 25 MG tablet TAKE 1 TABLET BY MOUTH TWICE DAILY  . ONETOUCH ULTRA test strip USE TEST STRIP(S) TO CHECK GLUCOSE TWICE DAILY   No facility-administered encounter medications on file as of 02/14/2019.     Goals Addressed            This Visit's Progress   . PharmD - Medication Management       Current Barriers:  [name] Knowledge deficits related to dietary choices to improve blood sugar control . Financial - currently in coverage gap of Medicare Part D plan  Pharmacist Clinical Goal(s):  [name] Over the next 30 days, patient  will work with CM Pharmacist to address needs related to medication regimen optimization.  Interventions: . Inquire about patient's COPD control o Reports breathing continues to be harder with exertion (as he is caring for his wife) or exposure to triggers, but at his normal o Provide patient with basic verbal COPD education on self care/management/and exacerbation prevention.  - Review importance of calling provider for Yellow Zone symtpms o Confirms taking respiratory medications, including inhalers, as directed . Counsel on importance of blood sugar monitoring and control o Review recent blood  sugars with patient (see below) o Denies any missed doses of metformin o Discuss correlation between elevated blood sugar readings and dietary choices (dietary indiscretions when eating out, dessert choices and portion size, etc) o Encourage patient to continue to focus on controlling carbohydrate portion sizes . Counsel on importance of blood pressure control o Reports has been monitoring blood pressure with new monitor (see below)  Patient Self Care Activities:  . Self administers medications as prescribed . Attends all scheduled provider appointments o Next appointment with PCP 03/12/19 o Next appointment with Pulmonologist 04/14/19 . Calls pharmacy for medication refills . Calls provider office for new concerns or questions  . Patient to check blood sugar regularly as directed and keep log Date Fasting Blood Glucose After Supper  (~2 hours) Notes  10 - October 117 137   11 - October 120 200   12 - October 178 345* *Reports large portion sizes of carbohydrates at both lunch and supper  13 - October 180 -   14 - October 135 175   15 - October 169 135   16 - October 178 -    . Patient to keep log when checks blood pressure Date AM BP PM BP  10 - October 127/86, HR 71 104/71, HR 79  11 - October 123/90, HR 91 117/72, HR 85  12 - October 124/84, HR 88 113/77, HR 87  13 - October 106/67, HR 77 -  14 - October 130/92, HR 82 124/84, HR 87  15 - October 105/73, HR 62 132/70, HR 59  16 - October 109/79, HR 81 -   . Patient will continue work on dietary modifications o Reducing portion sizes o Reducing total carbohydrate intake at each meal o Stopping eating when full, rather than when plate empty  Please see past updates related to this goal by clicking on the "Past Updates" button in the selected goal         Plan  The care management team will reach out to the patient again over the next 30 days.   [name], PharmD, [place]  [employer] [phone]

## 2019-02-17 ENCOUNTER — Telehealth: Payer: Self-pay

## 2019-02-20 DIAGNOSIS — M25561 Pain in right knee: Secondary | ICD-10-CM | POA: Diagnosis not present

## 2019-02-20 DIAGNOSIS — M1711 Unilateral primary osteoarthritis, right knee: Secondary | ICD-10-CM | POA: Diagnosis not present

## 2019-03-04 ENCOUNTER — Other Ambulatory Visit: Payer: Medicare Other

## 2019-03-04 DIAGNOSIS — E1142 Type 2 diabetes mellitus with diabetic polyneuropathy: Secondary | ICD-10-CM | POA: Diagnosis not present

## 2019-03-05 LAB — HEMOGLOBIN A1C
Hgb A1c MFr Bld: 7.6 % of total Hgb — ABNORMAL HIGH (ref <5.7)
Mean Plasma Glucose: 171 (calc)
eAG (mmol/L): 9.5 (calc)

## 2019-03-10 ENCOUNTER — Other Ambulatory Visit: Payer: Self-pay | Admitting: Family Medicine

## 2019-03-10 ENCOUNTER — Telehealth: Payer: Self-pay

## 2019-03-10 ENCOUNTER — Ambulatory Visit: Payer: Self-pay | Admitting: Pharmacist

## 2019-03-10 DIAGNOSIS — J432 Centrilobular emphysema: Secondary | ICD-10-CM

## 2019-03-10 DIAGNOSIS — E1142 Type 2 diabetes mellitus with diabetic polyneuropathy: Secondary | ICD-10-CM

## 2019-03-10 MED ORDER — ONETOUCH ULTRA 2 W/DEVICE KIT: PACK | 0 refills | Status: AC

## 2019-03-10 NOTE — Chronic Care Management (AMB) (Signed)
Chronic Care Management   Follow Up Note   03/10/2019 Name: Philip MUDD Sr. MRN: [id number] DOB: 1945-07-10  Referred by: Philip Hauser, DO Reason for referral : Chronic Care Management (Patient Phone Call)   Philip Potters Sr. is a 73 y.o. year old male who is a primary care patient of Philip Hauser, DO. The CCM team was consulted for assistance with chronic disease management and care coordination needs.  [name] has a past medical history including but not limited to type 2 diabetes, CAD, atrial fibrillation, hypothyroidism, OSA, hyperlipidemia, hypertension, CKD, seasonal allergies and COPD.  I reached out to Philip Potters Sr. by phone today.   Review of patient status, including review of consultants reports, relevant laboratory and other test results, and collaboration with appropriate care team members and the patient's provider was performed as part of comprehensive patient evaluation and provision of chronic care management services.     Outpatient Encounter Medications as of 03/10/2019  Medication Sig   albuterol (PROVENTIL) (2.5 MG/3ML) 0.083% nebulizer solution Inhale 3 mLs into the lungs every 6 (six) hours as needed for wheezing or shortness of breath.    Albuterol Sulfate 108 (90 Base) MCG/ACT AEPB Inhale 2 puffs into the lungs every 6 (six) hours as needed (shortness of breath).    cetirizine (ZYRTEC) 10 MG tablet Take 10 mg by mouth daily.    montelukast (SINGULAIR) 10 MG tablet Take 10 mg by mouth at bedtime.    predniSONE (DELTASONE) 5 MG tablet Take 5 mg by mouth every other day.    SYMBICORT 160-4.5 MCG/ACT inhaler Inhale 2 puffs 2 (two) times daily into the lungs.    Artificial Tear Ointment (DRY EYES OP) Place 1 drop into both eyes daily as needed (for dry eyes).   atorvastatin (LIPITOR) 40 MG tablet Take 1 tablet by mouth once daily   diclofenac sodium (VOLTAREN) 1 % GEL Apply 2 g topically 3 (three) times daily as needed (Use on  elbow bursitis and knee).   ELIQUIS 5 MG TABS tablet Take 1 tablet by mouth twice daily   furosemide (LASIX) 40 MG tablet TAKE 1 TABLET BY MOUTH ONCE DAILY. APPOINTMENT NEEDED FOR FURTHER REFILLS.   gabapentin (NEURONTIN) 100 MG capsule TAKE 1 CAPSULE BY MOUTH THREE TIMES DAILY   guaiFENesin (MUCINEX) 600 MG 12 hr tablet Take 600 mg by mouth 2 (two) times daily as needed.   LANCETS ULTRA THIN MISC 1 Device by Does not apply route 4 (four) times daily -  before meals and at bedtime.   levothyroxine (SYNTHROID) 88 MCG tablet Take 1 tablet by mouth once daily   losartan (COZAAR) 25 MG tablet Take 1 tablet by mouth once daily   metFORMIN (GLUCOPHAGE) 500 MG tablet Take 2 tablets (1,000 mg total) by mouth 2 (two) times daily with a meal.   metoprolol tartrate (LOPRESSOR) 25 MG tablet TAKE 1 TABLET BY MOUTH TWICE DAILY   ONETOUCH ULTRA test strip USE TEST STRIP(S) TO CHECK GLUCOSE TWICE DAILY   No facility-administered encounter medications on file as of 03/10/2019.     Goals Addressed            This Visit's Progress    PharmD - Medication Management       Current Barriers:   Knowledge deficits related to dietary choices to improve blood sugar control  Financial - currently in coverage gap of Medicare Part D plan  Pharmacist Clinical Goal(s):   Over the next 30 days, patient  will work with CM Pharmacist to address needs related to medication regimen optimization.  Interventions:  Perform chart review o Note patient's A1C has slightly increased to 7.6 (11/3), previously 7.3 (8/3) o Patient has virtual appointment with PCP 11/11 o Per 10/22 consult note from [facility] and Sports Medicine - series of 3 viscosupplementation injections planned for management of severe osteoarthritis of right knee  Discuss importance of following up with providers as needed for any COVID-19 symptoms/questions and following isolation instructions. Reports wife tested positive for  COVID-19 ~ 11/4 o Reports has rescheduled in person appointments accordingly o Reports has support for obtaining prescriptions from pharmacy, running errands, etc  Inquire about patient's COPD control o Reports breathing is currently "good" o Provide patient with basic verbal COPD education on self care/management/and exacerbation prevention.  - Review importance of calling provider for Yellow Zone symtpms - denies any current Yellow Zone symptoms o Confirms taking respiratory medications, including inhalers, as directed  Counsel on importance of blood sugar monitoring and control o Reports has been unable to monitor his blood sugar since 10/30, as glucometer stopped working - in need of new meter o Last readings from 10/30: fasting CBG: 180; after supper: 253  Will collaborate with PCP to request new prescription for One Touch Ultra 2 meter be sent to patient's [place].  Patient Self Care Activities:   Self administers medications as prescribed  Attends all scheduled provider appointments o Next appointment with PCP 03/12/19 o Next appointment with Pulmonologist 04/14/19  Calls pharmacy for medication refills  Calls provider office for new concerns or questions   Patient to check blood sugar regularly as directed and keep log  Patient to keep log when checks blood pressure  Patient will continue work on dietary modifications o Reducing portion sizes o Reducing total carbohydrate intake at each meal o Stopping eating when full, rather than when plate empty  Please see past updates related to this goal by clicking on the "Past Updates" button in the selected goal         Plan  The care management team will reach out to the patient again over the next 14 days.   [name], PharmD, [place] [employer] [phone]

## 2019-03-10 NOTE — Patient Instructions (Signed)
Thank you allowing the Chronic Care Management Team to be a part of your care! It was a pleasure speaking with you today!     CCM (Chronic Care Management) Team    Janci Minor RN, BSN Nurse Care Coordinator  [phone]   [name] PharmD  Clinical Pharmacist  [phone]   Eula Fried LCSW Clinical Social Worker [phone]  Visit Information  Goals Addressed            This Visit's Progress   . PharmD - Medication Management       Current Barriers:  [name] Knowledge deficits related to dietary choices to improve blood sugar control . Financial - currently in coverage gap of Medicare Part D plan  Pharmacist Clinical Goal(s):  [name] Over the next 30 days, patient will work with CM Pharmacist to address needs related to medication regimen optimization.  Interventions: . Perform chart review o Note patient's A1C has slightly increased to 7.6 (11/3), previously 7.3 (8/3) o Patient has virtual appointment with PCP 11/11 o Per 10/22 consult note from [facility] and Sports Medicine - series of 3 viscosupplementation injections planned for management of severe osteoarthritis of right knee . Discuss importance of following up with providers as needed for any COVID-19 symptoms/questions and following isolation instructions. Reports wife tested positive for COVID-19 ~ 11/4 o Reports has rescheduled in person appointments accordingly o Reports has support for obtaining prescriptions from pharmacy, running errands, etc . Inquire about patient's COPD control o Reports breathing is currently "good" o Provide patient with basic verbal COPD education on self care/management/and exacerbation prevention.  - Review importance of calling provider for Yellow Zone symtpms - denies any current Yellow Zone symptoms o Confirms taking respiratory medications, including inhalers, as directed . Counsel on importance of blood sugar monitoring and control o Reports has been unable  to monitor his blood sugar since 10/30, as glucometer stopped working - in need of new meter o Last readings from 10/30: fasting CBG: 180; after supper: 253 . Will collaborate with PCP to request new prescription for One Touch Ultra 2 meter be sent to patient's [place].  Patient Self Care Activities:  . Self administers medications as prescribed . Attends all scheduled provider appointments o Next appointment with PCP 03/12/19 o Next appointment with Pulmonologist 04/14/19 . Calls pharmacy for medication refills . Calls provider office for new concerns or questions  . Patient to check blood sugar regularly as directed and keep log . Patient to keep log when checks blood pressure . Patient will continue work on dietary modifications o Reducing portion sizes o Reducing total carbohydrate intake at each meal o Stopping eating when full, rather than when plate empty  Please see past updates related to this goal by clicking on the "Past Updates" button in the selected goal         The patient verbalized understanding of instructions provided today and declined a print copy of patient instruction materials.   The care management team will reach out to the patient again over the next 14 days.   [name], PharmD, [place] [employer] [phone]

## 2019-03-12 ENCOUNTER — Encounter: Payer: Self-pay | Admitting: Family Medicine

## 2019-03-12 ENCOUNTER — Other Ambulatory Visit: Payer: Self-pay | Admitting: Family Medicine

## 2019-03-12 ENCOUNTER — Other Ambulatory Visit: Payer: Self-pay

## 2019-03-12 ENCOUNTER — Ambulatory Visit (INDEPENDENT_AMBULATORY_CARE_PROVIDER_SITE_OTHER): Payer: Medicare Other | Admitting: Family Medicine

## 2019-03-12 DIAGNOSIS — E1142 Type 2 diabetes mellitus with diabetic polyneuropathy: Secondary | ICD-10-CM | POA: Diagnosis not present

## 2019-03-12 DIAGNOSIS — I480 Paroxysmal atrial fibrillation: Secondary | ICD-10-CM

## 2019-03-12 DIAGNOSIS — Z0000 Encounter for general adult medical examination without abnormal findings: Secondary | ICD-10-CM

## 2019-03-12 DIAGNOSIS — E034 Atrophy of thyroid (acquired): Secondary | ICD-10-CM

## 2019-03-12 DIAGNOSIS — E1169 Type 2 diabetes mellitus with other specified complication: Secondary | ICD-10-CM

## 2019-03-12 DIAGNOSIS — Z1211 Encounter for screening for malignant neoplasm of colon: Secondary | ICD-10-CM

## 2019-03-12 DIAGNOSIS — I129 Hypertensive chronic kidney disease with stage 1 through stage 4 chronic kidney disease, or unspecified chronic kidney disease: Secondary | ICD-10-CM

## 2019-03-12 DIAGNOSIS — N400 Enlarged prostate without lower urinary tract symptoms: Secondary | ICD-10-CM

## 2019-03-12 DIAGNOSIS — E785 Hyperlipidemia, unspecified: Secondary | ICD-10-CM

## 2019-03-12 NOTE — Assessment & Plan Note (Signed)
Stable controlled T2DM with lifestyle still at A1c 7.6, up from 7.3, at goal of < 8 - No hypoglycemia - Concer chronic low dose prednisone now at lower regimen EVERY OTHER DAY - for COPD Complications - CKD-II, peripheral neuropathy, cataracts Remains OFF GLP1 Bydureon (allergy), and Lantus insulin, Januvia  Plan:  1. Continue Metformin 1000mg  BID (500mg  x 2 BID) 2. Encourage improved lifestyle - low carb, low sugar diet, reduce portion size, continue improving regular exercise 3. Check CBG, bring log to next visit for review 4. Continue ARB, Statin 5. Follow-up 4 months  Continue to f/u with [facility] for CCM Pharmac. Future consider jardiance, as per CCM this should be covered option for him, he agrees to hold off now with controlled A1c

## 2019-03-12 NOTE — Progress Notes (Signed)
Virtual Visit via Telephone The purpose of this virtual visit is to provide medical care while limiting exposure to the novel coronavirus (COVID19) for both patient and office staff.  Consent was obtained for phone visit:  Yes.   Answered questions that patient had about telehealth interaction:  Yes.   I discussed the limitations, risks, security and privacy concerns of performing an evaluation and management service by telephone. I also discussed with the patient that there may be a patient responsible charge related to this service. The patient expressed understanding and agreed to proceed.  Patient Location: Home Provider Location: [facility])  ---------------------------------------------------------------------- Chief Complaint  Patient presents with  . Diabetes    S: Reviewed CMA documentation. I have called patient and gathered additional HPI as follows:  CHRONIC DM, Type 2: Last visit 11/2018 for Diabetes, following with CCM Pharmacy, he has improved lifestyle.  see prior notes Today doing well. He is not interested in adding any other medicine, we reviewed Jardiance in past. - CBG readingsimproved low 80-90s, 140-170 on average, some high readings >300 rare - Other update, he has reduced his prednisone down to 69m every OTHER day now Meds:Metformin 10069mtwice daily (50047mabs) - OFF Januvia, Bydureon, Lantus Reports good compliance. Tolerating well w/o side-effects Currently on ARB, on Statin Lifestyle: - Diet:significantly improved DM diet- reduced portions and lower carb content - Exercise: Walking at work, no other changes - Complicated by peripheral neuropathy and CKD-IIto III - with now improved kidney function on last lab. - Next DM Eye Exam in March 2021 [place] hypoglycemia  Additional update His wife diagnosed with COVID19 positive, he is in quarantine, he is asymptomatic.  Denies any high risk travel to areas of  current concern for COVID19. Denies any known or suspected exposure to person with or possibly with COVID19.  Denies any fevers, chills, sweats, body ache, cough, shortness of breath, sinus pain or pressure, headache, abdominal pain, diarrhea  HM Last colonoscopy years ago. He is due for screening. Interested in [employer] is asymptomatic.  Past Medical History:  Diagnosis Date  . Arthritis    a. right knee  . Asthma   . Atrial arrhythmia    [place] of nonsustained atach  . 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 etiology.  . Coronary artery disease    a. 05/2012 Cath: severe 3VD-->CABG by Dr. GerServando Snare 05/2012 with LIMA to LAD, SVG to LCX & SVG to RPDA.  . DMarland Kitchensrhythmia    AFIB  . Edema    LEGS/ FEET  . History of bronchitis   . HOH (hard of hearing)   . Hypercholesteremia   . Neuropathy   . Shingles   . Sleep apnea    NO CPAP   Social History   Tobacco Use  . Smoking status: Former Smoker    Packs/day: 1.00    Years: 8.00    Pack years: 8.00    Types: Cigarettes    Quit date: 12/31/1967    Years since quitting: 51.2  . Smokeless tobacco: Former UseNetwork engineere Topics  . Alcohol use: No    Alcohol/week: 0.0 standard drinks    Comment: 05/06/2012 "last alcohol several years ago; never had problem wit"  . Drug use: No    Current Outpatient Medications:  .  albuterol (PROVENTIL) (2.5 MG/3ML) 0.083% nebulizer solution, Inhale 3 mLs into the lungs every 6 (six) hours as needed for wheezing or shortness of breath. ,  Disp: , Rfl:  .  Albuterol Sulfate 108 (90 Base) MCG/ACT AEPB, Inhale 2 puffs into the lungs every 6 (six) hours as needed (shortness of breath). , Disp: , Rfl:  .  Artificial Tear Ointment (DRY EYES OP), Place 1 drop into both eyes daily as needed (for dry eyes)., Disp: , Rfl:  .  atorvastatin (LIPITOR) 40 MG tablet, Take 1 tablet by mouth once daily, Disp: 90 tablet, Rfl: 3 .  Blood Glucose Monitoring Suppl (ONE TOUCH ULTRA 2) w/Device KIT, Use to check  blood sugar as advised up to twice a day, Disp: 1 kit, Rfl: 0 .  cetirizine (ZYRTEC) 10 MG tablet, Take 10 mg by mouth daily. , Disp: , Rfl:  .  diclofenac sodium (VOLTAREN) 1 % GEL, Apply 2 g topically 3 (three) times daily as needed (Use on elbow bursitis and knee)., Disp: 100 g, Rfl: 2 .  ELIQUIS 5 MG TABS tablet, Take 1 tablet by mouth twice daily, Disp: 60 tablet, Rfl: 6 .  furosemide (LASIX) 40 MG tablet, TAKE 1 TABLET BY MOUTH ONCE DAILY. APPOINTMENT NEEDED FOR FURTHER REFILLS. (Patient taking differently: Every other day), Disp: 90 tablet, Rfl: 3 .  gabapentin (NEURONTIN) 100 MG capsule, TAKE 1 CAPSULE BY MOUTH THREE TIMES DAILY, Disp: 270 capsule, Rfl: 1 .  LANCETS ULTRA THIN MISC, 1 Device by Does not apply route 4 (four) times daily -  before meals and at bedtime., Disp: 200 each, Rfl: 5 .  levothyroxine (SYNTHROID) 88 MCG tablet, Take 1 tablet by mouth once daily, Disp: 90 tablet, Rfl: 1 .  losartan (COZAAR) 25 MG tablet, Take 1 tablet by mouth once daily, Disp: 90 tablet, Rfl: 0 .  metFORMIN (GLUCOPHAGE) 500 MG tablet, Take 2 tablets (1,000 mg total) by mouth 2 (two) times daily with a meal., Disp: 360 tablet, Rfl: 3 .  metoprolol tartrate (LOPRESSOR) 25 MG tablet, TAKE 1 TABLET BY MOUTH TWICE DAILY, Disp: 180 tablet, Rfl: 3 .  montelukast (SINGULAIR) 10 MG tablet, Take 10 mg by mouth at bedtime. , Disp: , Rfl:  .  ONETOUCH ULTRA test strip, USE TEST STRIP(S) TO CHECK GLUCOSE TWICE DAILY, Disp: 200 each, Rfl: 3 .  predniSONE (DELTASONE) 5 MG tablet, Take 5 mg by mouth every other day. , Disp: , Rfl:  .  SYMBICORT 160-4.5 MCG/ACT inhaler, Inhale 2 puffs 2 (two) times daily into the lungs. , Disp: , Rfl:  .  guaiFENesin (MUCINEX) 600 MG 12 hr tablet, Take 600 mg by mouth 2 (two) times daily as needed., Disp: , Rfl:   Depression screen [facility] 2/9 03/12/2019 12/31/2018 12/02/2018  Decreased Interest 0 0 0  Down, Depressed, Hopeless 0 0 0  PHQ - 2 Score 0 0 0  Altered sleeping - - -  Tired,  decreased energy - - -  Change in appetite - - -  Feeling bad or failure about yourself  - - -  Trouble concentrating - - -  Moving slowly or fidgety/restless - - -  Suicidal thoughts - - -  PHQ-9 Score - - -  Difficult doing work/chores - - -    No flowsheet data found.  -------------------------------------------------------------------------- O: No physical exam performed due to remote telephone encounter.  Lab results reviewed.  Recent Results (from the past 2160 hour(s))  Hemoglobin A1c     Status: Abnormal   Collection Time: 03/04/19  8:18 AM  Result Value Ref Range   Hgb A1c MFr Bld 7.6 (H) <5.7 % of total Hgb  Comment: For someone without known diabetes, a hemoglobin A1c value of 6.5% or greater indicates that they may have  diabetes and this should be confirmed with a follow-up  test. . For someone with known diabetes, a value <7% indicates  that their diabetes is well controlled and a value  greater than or equal to 7% indicates suboptimal  control. A1c targets should be individualized based on  duration of diabetes, age, comorbid conditions, and  other considerations. . Currently, no consensus exists regarding use of hemoglobin A1c for diagnosis of diabetes for children. .    Mean Plasma Glucose 171 (calc)   eAG (mmol/L) 9.5 (calc)    -------------------------------------------------------------------------- A&P:  Problem List Items Addressed This Visit    Type 2 diabetes mellitus with peripheral neuropathy (HCC) - Primary    Stable controlled T2DM with lifestyle still at A1c 7.6, up from 7.3, at goal of < 8 - No hypoglycemia - Concer chronic low dose prednisone now at lower regimen EVERY OTHER DAY - for COPD Complications - CKD-II, peripheral neuropathy, cataracts Remains OFF GLP1 Bydureon (allergy), and Lantus insulin, Januvia  Plan:  1. Continue Metformin 1076m BID (5042mx 2 BID) 2. Encourage improved lifestyle - low carb, low sugar diet,  reduce portion size, continue improving regular exercise 3. Check CBG, bring log to next visit for review 4. Continue ARB, Statin 5. Follow-up 4 months  [facility]. Future consider jardiance, as per CCM this should be covered option for him, he agrees to hold off now with controlled A1c       Other Visit Diagnoses    Screening for colon cancer       Relevant Orders   [facility]     Due for routine colon cancer screening.  - Discussion today about recommendations for either Colonoscopy or Cologuard screening, benefits and risks of screening, interested in Cologuard, understands that if positive then recommendation is for diagnostic colonoscopy to follow-up. - Ordered Cologuard today  No orders of the defined types were placed in this encounter.   Follow-up: - Return in 4 months for Annual Physical - Future labs ordered for 06/2019  Patient verbalizes understanding with the above medical recommendations including the limitation of remote medical advice.  Specific follow-up and call-back criteria were given for patient to follow-up or seek medical care more urgently if needed.   - Time spent in direct consultation with patient on phone: 9 minutes  AlNobie PutnamDOMarineroup 03/12/2019, 9:45 AM

## 2019-03-12 NOTE — Patient Instructions (Addendum)
Thank you for coming to the office today.  Recent Labs    06/24/18 0846 12/02/18 1339 03/04/19 0818  HGBA1C 8.1* 7.3* 7.6*   Ordered Cologuard   DUE for FASTING BLOOD WORK (no food or drink after midnight before the lab appointment, only water or coffee without cream/sugar on the morning of)  SCHEDULE "Lab Only" visit in the morning at the clinic for lab draw in 4 MONTHS   - Make sure Lab Only appointment is at about 1 week before your next appointment, so that results will be available  For Lab Results, once available within 2-3 days of blood draw, you can can log in to MyChart online to view your results and a brief explanation. Also, we can discuss results at next follow-up visit.   Please schedule a Follow-up Appointment to: Return in about 4 months (around 07/10/2019) for Annual Physical.  If you have any other questions or concerns, please feel free to call the office or send a message through [place]. You may also schedule an earlier appointment if necessary.  Additionally, you may be receiving a survey about your experience at our office within a few days to 1 week by e-mail or mail. We value your feedback.  [name], DO [place]

## 2019-03-13 ENCOUNTER — Telehealth: Payer: Self-pay

## 2019-03-16 ENCOUNTER — Other Ambulatory Visit: Payer: Self-pay | Admitting: Family Medicine

## 2019-03-16 ENCOUNTER — Other Ambulatory Visit: Payer: Self-pay | Admitting: Cardiovascular Disease

## 2019-03-16 DIAGNOSIS — E034 Atrophy of thyroid (acquired): Secondary | ICD-10-CM

## 2019-03-17 ENCOUNTER — Telehealth: Payer: Self-pay

## 2019-03-24 ENCOUNTER — Ambulatory Visit: Payer: Self-pay | Admitting: Pharmacist

## 2019-03-24 DIAGNOSIS — N182 Chronic kidney disease, stage 2 (mild): Secondary | ICD-10-CM

## 2019-03-24 DIAGNOSIS — J432 Centrilobular emphysema: Secondary | ICD-10-CM

## 2019-03-24 DIAGNOSIS — I129 Hypertensive chronic kidney disease with stage 1 through stage 4 chronic kidney disease, or unspecified chronic kidney disease: Secondary | ICD-10-CM

## 2019-03-24 DIAGNOSIS — E1142 Type 2 diabetes mellitus with diabetic polyneuropathy: Secondary | ICD-10-CM

## 2019-03-24 NOTE — Patient Instructions (Signed)
Thank you allowing the Chronic Care Management Team to be a part of your care! It was a pleasure speaking with you today!     CCM (Chronic Care Management) Team    Janci Minor RN, BSN Nurse Care Coordinator  [phone]   [name] PharmD  Clinical Pharmacist  [phone]   Eula Fried LCSW Clinical Social Worker [phone]  Visit Information  Goals Addressed            This Visit's Progress   . PharmD - Medication Management       Current Barriers:  [name] Knowledge deficits related to dietary choices to improve blood sugar control . Financial - currently in coverage gap of Medicare Part D plan  Pharmacist Clinical Goal(s):  [name] Over the next 30 days, patient will work with CM Pharmacist to address needs related to medication regimen optimization.  Interventions: . Discuss importance of following up with providers as needed for COVID-19 symptoms/questions. Patient reports continuing to follow isolation instructions. Reports wife tested positive for COVID-19 ~ 11/4 . Inquire about patient's COPD control o Reports breathing is currently improved; states that he had a couple of days last week when he was short of breath as well as had cough and congestion which made it difficult for him to sleep.  o Provide patient with basic verbal COPD education on self care/management/and exacerbation prevention.  - Review importance of calling provider for Yellow Zone symtpms and for urgent medical care for any red zone symptoms o Confirms taking respiratory medications, including inhalers, as directed . Counsel on importance of blood sugar monitoring and control o Reports picked up new glucometer on 11/21 o Fasting CBGs:  - 11/22: 180 - 11/23: 171 . Counsel on importance of blood pressure monitoring and control o Latest BP results  - 11/22: 127/82, HR 95 - 11/23: 127/85, HR 86  Patient Self Care Activities:  . Self administers medications as prescribed . Attends all  scheduled provider appointments o Next appointment with Pulmonologist 04/14/19 . Calls pharmacy for medication refills . Calls provider office for new concerns or questions  . Patient to check blood sugar regularly as directed and keep log . Patient to keep log when checks blood pressure . Patient will continue work on dietary modifications o Reducing portion sizes o Reducing total carbohydrate intake at each meal o Stopping eating when full, rather than when plate empty  Please see past updates related to this goal by clicking on the "Past Updates" button in the selected goal         The patient verbalized understanding of instructions provided today and declined a print copy of patient instruction materials.   Telephone follow up appointment with care management team member scheduled for: 12/8 at 1:30 pm  [name], PharmD, [employer] [phone]

## 2019-03-24 NOTE — Chronic Care Management (AMB) (Signed)
Chronic Care Management   Follow Up Note   03/24/2019 Name: Philip ORSAK Sr. MRN: [id number] DOB: 11-Jul-1945  Referred by: [name], DO Reason for referral : No chief complaint on file.   Philip Potters Sr. is a 73 y.o. year old male who is a primary care patient of [name], DO. The CCM team was consulted for assistance with chronic disease management and care coordination needs. Philip [name] has a past medical history including but not limited to type 2 diabetes, CAD, atrial fibrillation, hypothyroidism, OSA, hyperlipidemia, hypertension, CKD, seasonal allergies and COPD.    I reached out to Philip Potters Sr. by phone today.   Review of patient status, including review of consultants reports, relevant laboratory and other test results, and collaboration with appropriate care team members and the patient's provider was performed as part of comprehensive patient evaluation and provision of chronic care management services.     Outpatient Encounter Medications as of 03/24/2019  Medication Sig   albuterol (PROVENTIL) (2.5 MG/3ML) 0.083% nebulizer solution Inhale 3 mLs into the lungs every 6 (six) hours as needed for wheezing or shortness of breath.    Albuterol Sulfate 108 (90 Base) MCG/ACT AEPB Inhale 2 puffs into the lungs every 6 (six) hours as needed (shortness of breath).    cetirizine (ZYRTEC) 10 MG tablet Take 10 mg by mouth daily.    montelukast (SINGULAIR) 10 MG tablet Take 10 mg by mouth at bedtime.    predniSONE (DELTASONE) 5 MG tablet Take 5 mg by mouth every other day.    SYMBICORT 160-4.5 MCG/ACT inhaler Inhale 2 puffs 2 (two) times daily into the lungs.    Artificial Tear Ointment (DRY EYES OP) Place 1 drop into both eyes daily as needed (for dry eyes).   atorvastatin (LIPITOR) 40 MG tablet Take 1 tablet by mouth once daily   Blood Glucose Monitoring Suppl (ONE TOUCH ULTRA 2) w/Device KIT Use to check blood sugar as advised up to  twice a day   diclofenac sodium (VOLTAREN) 1 % GEL Apply 2 g topically 3 (three) times daily as needed (Use on elbow bursitis and knee).   ELIQUIS 5 MG TABS tablet Take 1 tablet by mouth twice daily   furosemide (LASIX) 40 MG tablet TAKE 1 TABLET BY MOUTH ONCE DAILY. APPOINTMENT NEEDED FOR FURTHER REFILLS. (Patient taking differently: Every other day)   gabapentin (NEURONTIN) 100 MG capsule TAKE 1 CAPSULE BY MOUTH THREE TIMES DAILY   guaiFENesin (MUCINEX) 600 MG 12 hr tablet Take 600 mg by mouth 2 (two) times daily as needed.   LANCETS ULTRA THIN MISC 1 Device by Does not apply route 4 (four) times daily -  before meals and at bedtime.   levothyroxine (SYNTHROID) 88 MCG tablet Take 1 tablet by mouth once daily   losartan (COZAAR) 25 MG tablet Take 1 tablet by mouth once daily   metFORMIN (GLUCOPHAGE) 500 MG tablet Take 2 tablets (1,000 mg total) by mouth 2 (two) times daily with a meal.   metoprolol tartrate (LOPRESSOR) 25 MG tablet Take 1 tablet (25 mg total) by mouth 2 (two) times daily. *NEEDS OFFICE VISIT FOR FURTHER REFILLS*   ONETOUCH ULTRA test strip USE TEST STRIP(S) TO CHECK GLUCOSE TWICE DAILY   No facility-administered encounter medications on file as of 03/24/2019.     Goals Addressed            This Visit's Progress    PharmD - Medication Management  Current Barriers:   Knowledge deficits related to dietary choices to improve blood sugar control  Financial - currently in coverage gap of Medicare Part D plan  Pharmacist Clinical Goal(s):   Over the next 30 days, patient will work with CM Pharmacist to address needs related to medication regimen optimization.  Interventions:  Discuss importance of following up with providers as needed for COVID-19 symptoms/questions. Patient reports continuing to follow isolation instructions. Reports wife tested positive for COVID-19 ~ 11/4  Inquire about patient's COPD control o Reports breathing is currently  improved; states that he had a couple of days last week when he was short of breath as well as had cough and congestion which made it difficult for him to sleep.  o Provide patient with basic verbal COPD education on self care/management/and exacerbation prevention.  - Review importance of calling provider for Yellow Zone symtpms and for urgent medical care for any red zone symptoms o Confirms taking respiratory medications, including inhalers, as directed  Counsel on importance of blood sugar monitoring and control o Reports picked up new glucometer on 11/21 o Fasting CBGs:  - 11/22: 180 - 11/23: 171  Counsel on importance of blood pressure monitoring and control o Latest BP results  - 11/22: 127/82, HR 95 - 11/23: 127/85, HR 86  Patient Self Care Activities:   Self administers medications as prescribed  Attends all scheduled provider appointments o Next appointment with Pulmonologist 04/14/19  Calls pharmacy for medication refills  Calls provider office for new concerns or questions   Patient to check blood sugar regularly as directed and keep log  Patient to keep log when checks blood pressure  Patient will continue work on dietary modifications o Reducing portion sizes o Reducing total carbohydrate intake at each meal o Stopping eating when full, rather than when plate empty  Please see past updates related to this goal by clicking on the "Past Updates" button in the selected goal         Plan  Telephone follow up appointment with care management team member scheduled for: 12/8 at 1:30 pm  [name], PharmD, [place] [phone]

## 2019-03-28 ENCOUNTER — Other Ambulatory Visit: Payer: Self-pay | Admitting: Cardiovascular Disease

## 2019-04-01 DIAGNOSIS — M1711 Unilateral primary osteoarthritis, right knee: Secondary | ICD-10-CM | POA: Diagnosis not present

## 2019-04-08 ENCOUNTER — Ambulatory Visit (INDEPENDENT_AMBULATORY_CARE_PROVIDER_SITE_OTHER): Payer: Medicare Other | Admitting: Pharmacist

## 2019-04-08 DIAGNOSIS — J432 Centrilobular emphysema: Secondary | ICD-10-CM | POA: Diagnosis not present

## 2019-04-08 DIAGNOSIS — I129 Hypertensive chronic kidney disease with stage 1 through stage 4 chronic kidney disease, or unspecified chronic kidney disease: Secondary | ICD-10-CM

## 2019-04-08 DIAGNOSIS — E1142 Type 2 diabetes mellitus with diabetic polyneuropathy: Secondary | ICD-10-CM | POA: Diagnosis not present

## 2019-04-08 DIAGNOSIS — M1711 Unilateral primary osteoarthritis, right knee: Secondary | ICD-10-CM | POA: Diagnosis not present

## 2019-04-08 DIAGNOSIS — N182 Chronic kidney disease, stage 2 (mild): Secondary | ICD-10-CM | POA: Diagnosis not present

## 2019-04-08 NOTE — Chronic Care Management (AMB) (Signed)
Chronic Care Management   Follow Up Note   04/08/2019 Name: Philip ALLEN Sr. MRN: [id number] DOB: Jul 31, 1945  Referred by: [name], DO Reason for referral : Chronic Care Management (Patient Phone Call)   Philip Potters Sr. is a 73 y.o. year old male who is a primary care patient of [name], DO. The CCM team was consulted for assistance with chronic disease management and care coordination needs.  Philip [name] has a past medical history including but not limited to type 2 diabetes, CAD, atrial fibrillation, hypothyroidism, OSA, hyperlipidemia, hypertension, CKD, seasonal allergies, knee OA and COPD.    I reached out to Philip Potters Sr. by phone today.   Review of patient status, including review of consultants reports, relevant laboratory and other test results, and collaboration with appropriate care team members and the patient's provider was performed as part of comprehensive patient evaluation and provision of chronic care management services.     Outpatient Encounter Medications as of 04/08/2019  Medication Sig  . Albuterol Sulfate 108 (90 Base) MCG/ACT AEPB Inhale 2 puffs into the lungs every 6 (six) hours as needed (shortness of breath).   . metFORMIN (GLUCOPHAGE) 500 MG tablet Take 2 tablets (1,000 mg total) by mouth 2 (two) times daily with a meal.  . predniSONE (DELTASONE) 5 MG tablet Take 5 mg by mouth every other day.   . SYMBICORT 160-4.5 MCG/ACT inhaler Inhale 2 puffs 2 (two) times daily into the lungs.   [name] albuterol (PROVENTIL) (2.5 MG/3ML) 0.083% nebulizer solution Inhale 3 mLs into the lungs every 6 (six) hours as needed for wheezing or shortness of breath.   . Artificial Tear Ointment (DRY EYES OP) Place 1 drop into both eyes daily as needed (for dry eyes).  [name] atorvastatin (LIPITOR) 40 MG tablet Take 1 tablet by mouth once daily  . Blood Glucose Monitoring Suppl (ONE TOUCH ULTRA 2) w/Device KIT Use to check blood sugar as advised up to  twice a day  . cetirizine (ZYRTEC) 10 MG tablet Take 10 mg by mouth daily.   . diclofenac sodium (VOLTAREN) 1 % GEL Apply 2 g topically 3 (three) times daily as needed (Use on elbow bursitis and knee).  [name] ELIQUIS 5 MG TABS tablet Take 1 tablet by mouth twice daily  . furosemide (LASIX) 40 MG tablet TAKE 1 TABLET BY MOUTH ONCE DAILY. APPOINTMENT NEEDED FOR FURTHER REFILLS. (Patient taking differently: Every other day)  . gabapentin (NEURONTIN) 100 MG capsule TAKE 1 CAPSULE BY MOUTH THREE TIMES DAILY  . guaiFENesin (MUCINEX) 600 MG 12 hr tablet Take 600 mg by mouth 2 (two) times daily as needed.  [name] LANCETS ULTRA THIN MISC 1 Device by Does not apply route 4 (four) times daily -  before meals and at bedtime.  [name] levothyroxine (SYNTHROID) 88 MCG tablet Take 1 tablet by mouth once daily  . losartan (COZAAR) 25 MG tablet Take 1 tablet by mouth once daily  . metoprolol tartrate (LOPRESSOR) 25 MG tablet Take 1 tablet (25 mg total) by mouth 2 (two) times daily. *NEEDS OFFICE VISIT FOR FURTHER REFILLS*  . montelukast (SINGULAIR) 10 MG tablet Take 10 mg by mouth at bedtime.   Philip [name] ULTRA test strip USE TEST STRIP(S) TO CHECK GLUCOSE TWICE DAILY   No facility-administered encounter medications on file as of 04/08/2019.     Goals Addressed            This Visit's Progress   . PharmD - Medication Management  Current Barriers:  [name] Knowledge deficits related to dietary choices to improve blood sugar control . Financial - currently in coverage gap of Medicare Part D plan  Pharmacist Clinical Goal(s):  [name] Over the next 30 days, patient will work with CM Pharmacist to address needs related to medication regimen optimization.  Interventions: . Perform chart review o Patient seen by [place] today re: right knee pain. Received next visco supplementation (GELSYN-3) injection.  . Inquire about patient's COPD control o Reports breathing is improved. States that he believes his  allergies have gotten better and he is using his albuterol inhaler less often. Philip [name] on importance of blood sugar monitoring and control o Confirms currently taking : metformin 500 mg - 2 tablets (1,000 mg) twice daily as directed - Denies any missed doses o Review recent blood sugar results (see below) o Further discuss importance of diet and exercise in blood sugar control - Again discuss importance of limiting carbohydrate portion size in meals and snacks . Counsel on importance of blood pressure monitoring and control o Review recent BP results (see below) . Reports his ability to exercise is currently limited by his right knee pain. Patient is hopeful to see results from visco supplement knee injections that he has been receiving. Reports received #2/3 injections today.   Patient Self Care Activities:  . Self administers medications as prescribed . Attends all scheduled provider appointments o Next appointment with Pulmonologist 04/14/19 o Next appointment with [facility] on 12/15 . Calls pharmacy for medication refills . Calls provider office for new concerns or questions  . Patient to check blood sugar regularly as directed and keep log  Date Fasting Blood Glucose After Supper  (~2 hours)   1 - December 162 169   2 - December 136 212   3 - December - - *Sweets overnight  4 - December 253* 286   5 - December 141 243   6 - December 138 231   7 - December 126 222   8 - December 158 -   Average 159 227    . Patient to keep log when checks blood pressure Date AM BP PM BP  1 - December 113/99, HR 85 115/78, HR 76  2 - December - 140/82, HR 78  3 - December - -  4 - December 104/76, HR 90 120/85, HR 88  5 - December 132/91, HR 73 113/82, HR 83  6 - December 133/88, HR 76 105/75, HR 82  7 - December 126/93, HR 91 127/80, HR 85  8 - December 118/78, HR 76    . Patient will continue work on dietary modifications o Reducing portion sizes o Reducing total  carbohydrate intake at each meal o Stopping eating when full, rather than when plate empty  Please see past updates related to this goal by clicking on the "Past Updates" button in the selected goal         Plan  Telephone follow up appointment with care management team member scheduled for: 1/5 at 1:30 pm  [name], PharmD, [place] [phone]

## 2019-04-08 NOTE — Patient Instructions (Signed)
Thank you allowing the Chronic Care Management Team to be a part of your care! It was a pleasure speaking with you today!     CCM (Chronic Care Management) Team    Janci Minor RN, BSN Nurse Care Coordinator  [phone]   [name] PharmD  Clinical Pharmacist  [phone]   Eula Fried LCSW Clinical Social Worker [phone]  Visit Information  Goals Addressed            This Visit's Progress   . PharmD - Medication Management       Current Barriers:  [name] Knowledge deficits related to dietary choices to improve blood sugar control . Financial - currently in coverage gap of Medicare Part D plan  Pharmacist Clinical Goal(s):  [name] Over the next 30 days, patient will work with CM Pharmacist to address needs related to medication regimen optimization.  Interventions: . Perform chart review . Inquire about patient's COPD control o Reports breathing is improved. States that he believes his allergies have gotten better and he is using his albuterol inhaler less often. [name] on importance of blood sugar monitoring and control o Confirms currently taking : metformin 500 mg - 2 tablets (1,000 mg) twice daily as directed - Denies any missed doses o Review recent blood sugar results (see below) o Further discuss importance of diet and exercise in blood sugar control - Again discuss importance of limiting carbohydrate portion size in meals and snacks . Counsel on importance of blood pressure monitoring and control o Review recent BP results (see below) . Reports his ability to exercise is currently limited by his right knee pain. Patient is hopeful to see results from visco supplement knee injections that he has been receiving. Reports received #2/3 injections today.   Patient Self Care Activities:  . Self administers medications as prescribed . Attends all scheduled provider appointments o Next appointment with Pulmonologist 04/14/19 o Next appointment with  [facility] on 12/15 . Calls pharmacy for medication refills . Calls provider office for new concerns or questions  . Patient to check blood sugar regularly as directed and keep log  Date Fasting Blood Glucose After Supper  (~2 hours)   1 - December 162 169   2 - December 136 212   3 - December - - *Sweets overnight  4 - December 253* 286   5 - December 141 243   6 - December 138 231   7 - December 126 222   8 - December 158 -   Average 159 227    . Patient to keep log when checks blood pressure Date AM BP PM BP  1 - December 113/99, HR 85 115/78, HR 76  2 - December - 140/82, HR 78  3 - December - -  4 - December 104/76, HR 90 120/85, HR 88  5 - December 132/91, HR 73 113/82, HR 83  6 - December 133/88, HR 76 105/75, HR 82  7 - December 126/93, HR 91 127/80, HR 85  8 - December 118/78, HR 76    . Patient will continue work on dietary modifications o Reducing portion sizes o Reducing total carbohydrate intake at each meal o Stopping eating when full, rather than when plate empty  Please see past updates related to this goal by clicking on the "Past Updates" button in the selected goal         The patient verbalized understanding of instructions provided today and declined a print copy of  patient Paediatric nurse.   Telephone follow up appointment with care management team member scheduled for: 1/5 at 1:30 pm  [name], PharmD, [place] [phone]

## 2019-04-14 ENCOUNTER — Ambulatory Visit: Payer: Medicare Other | Admitting: Family

## 2019-04-14 DIAGNOSIS — R05 Cough: Secondary | ICD-10-CM | POA: Diagnosis not present

## 2019-04-14 DIAGNOSIS — Z9989 Dependence on other enabling machines and devices: Secondary | ICD-10-CM | POA: Diagnosis not present

## 2019-04-14 DIAGNOSIS — J439 Emphysema, unspecified: Secondary | ICD-10-CM | POA: Diagnosis not present

## 2019-04-14 DIAGNOSIS — G4733 Obstructive sleep apnea (adult) (pediatric): Secondary | ICD-10-CM | POA: Diagnosis not present

## 2019-04-15 DIAGNOSIS — M1711 Unilateral primary osteoarthritis, right knee: Secondary | ICD-10-CM | POA: Diagnosis not present

## 2019-04-16 ENCOUNTER — Other Ambulatory Visit: Payer: Self-pay | Admitting: Cardiovascular Disease

## 2019-04-17 ENCOUNTER — Other Ambulatory Visit: Payer: Self-pay

## 2019-04-17 ENCOUNTER — Encounter: Payer: Self-pay | Admitting: Nurse Practitioner

## 2019-04-17 ENCOUNTER — Ambulatory Visit (INDEPENDENT_AMBULATORY_CARE_PROVIDER_SITE_OTHER): Payer: Medicare Other | Admitting: Nurse Practitioner

## 2019-04-17 ENCOUNTER — Telehealth: Payer: Self-pay

## 2019-04-17 VITALS — BP 122/80 | HR 87 | Ht 71.0 in | Wt 208.0 lb

## 2019-04-17 DIAGNOSIS — I2510 Atherosclerotic heart disease of native coronary artery without angina pectoris: Secondary | ICD-10-CM

## 2019-04-17 DIAGNOSIS — I480 Paroxysmal atrial fibrillation: Secondary | ICD-10-CM | POA: Diagnosis not present

## 2019-04-17 DIAGNOSIS — E785 Hyperlipidemia, unspecified: Secondary | ICD-10-CM

## 2019-04-17 DIAGNOSIS — I471 Supraventricular tachycardia: Secondary | ICD-10-CM | POA: Diagnosis not present

## 2019-04-17 DIAGNOSIS — I4719 Other supraventricular tachycardia: Secondary | ICD-10-CM

## 2019-04-17 DIAGNOSIS — I10 Essential (primary) hypertension: Secondary | ICD-10-CM | POA: Diagnosis not present

## 2019-04-17 NOTE — Patient Instructions (Signed)
Medication Instructions:  Your physician recommends that you continue on your current medications as directed. Please refer to the Current Medication list given to you today.  *If you need a refill on your cardiac medications before your next appointment, please call your pharmacy*  Lab Work: Your physician recommends that you have lab work today(CBC, BMET, Mag, Tsh)  If you have labs (blood work) drawn today and your tests are completely normal, you will receive your results only by: [name] MyChart Message (if you have MyChart) OR . A paper copy in the mail If you have any lab test that is abnormal or we need to change your treatment, we will call you to review the results.  Testing/Procedures: None ordered  Follow-Up: At [facility], you and your health needs are our priority.  As part of our continuing mission to provide you with exceptional heart care, we have created designated Provider Care Teams.  These Care Teams include your primary Cardiologist (physician) and Advanced Practice Providers (APPs -  Physician Assistants and Nurse Practitioners) who all work together to provide you with the care you need, when you need it.  Your next appointment:   6 month(s)  The format for your next appointment:   In Person  Provider:    You may see [name], MD or Murray Hodgkins, NP.

## 2019-04-17 NOTE — Progress Notes (Signed)
Office Visit    Patient Name: Philip ALTERGOTT Sr. Date of Encounter: 04/17/2019  Primary Care Provider:  [name], DO Primary Cardiologist:  [name], MD  Chief Complaint    73 y/o ? w/ a h/o CAD status post three-vessel bypass in January 2014, hypertension, hyperlipidemia, type 2 diabetes mellitus, diastolic dysfunction, paroxysmal atrial fibrillation, ectopic atrial tachycardia, and COPD, who presents for follow-up of CAD.  Past Medical History    Past Medical History:  Diagnosis Date  . Arthritis    a. right knee  . Asthma   . Atrial tachycardia ([place])   . 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 etiology.  [name] COPD (chronic obstructive pulmonary disease) ([place])   . Coronary artery disease    a. 05/2012 Cath: severe 3VD-->CABG x 4 by Dr. Servando Snare in 1/2014[place], SVG->LCX & SVG->RPDA; b. 06/2014 MV: no ischemia/infarct.  . Diastolic dysfunction    a. 06/2015 Echo: EF 50-55%, Gr1 DD. Mild MR. Mildly dil LA. Nl RV fxn. Nl PASP.  [name] Edema    LEGS/ FEET  . Essential hypertension   . History of bronchitis   . HOH (hard of hearing)   . Hypercholesteremia   . Neuropathy   . PAF (paroxysmal atrial fibrillation) ([facility])    a. CHA2DS2VASc = 4-->Eliquis.  . Shingles   . Sleep apnea    NO CPAP  . Type II diabetes mellitus ([place])    Past Surgical History:  Procedure Laterality Date  . ANTERIOR CERVICAL DECOMP/DISCECTOMY FUSION  ~ 2009  . BACK SURGERY     CERVICAL FUSION  . CARDIAC CATHETERIZATION  05/06/2012   Significant three-vessel coronary artery disease with normal ejection fraction  . CATARACT EXTRACTION W/PHACO Right 08/15/2017   Procedure: CATARACT EXTRACTION PHACO AND INTRAOCULAR LENS PLACEMENT (IOC);  Surgeon: Birder Robson, MD;  Location: [facility] ORS;  Service: Ophthalmology;  Laterality: Right;  [place] 00:23 AP% 13.3 CDE 3.11 Fluid pack lot # [id number]  . CATARACT EXTRACTION W/PHACO Left 10/03/2017   Procedure: CATARACT EXTRACTION PHACO AND  INTRAOCULAR LENS PLACEMENT (IOC);  Surgeon: Birder Robson, MD;  Location: [facility] ORS;  Service: Ophthalmology;  Laterality: Left;  Lot #[id number] H [place]: 00:21.2 AP%:13.9 CDE: 2.96  . CORONARY ANGIOPLASTY    . CORONARY ARTERY BYPASS GRAFT  05/07/2012   Procedure: CORONARY ARTERY BYPASS GRAFTING (CABG);  Surgeon: [name], MD;  Location: [place];  Service: Open Heart Surgery;  Laterality: N/A;  times three  . INTRAOPERATIVE TRANSESOPHAGEAL ECHOCARDIOGRAM  05/07/2012   Procedure: INTRAOPERATIVE TRANSESOPHAGEAL ECHOCARDIOGRAM;  Surgeon: [name], MD;  Location: [place];  Service: Open Heart Surgery;  Laterality: N/A;  . KNEE ARTHROSCOPY  ~ 2000   "right" (05/06/2012)  . KNEE SURGERY  2003   right  . NECK SURGERY  2008   Plate in neck    Allergies  Allergies  Allergen Reactions  . Bydureon [Exenatide] Anaphylaxis    Possible from bydureon, not 100% confirmed  . Clindamycin/Lincomycin Hives and Itching  . Penicillins Hives, Itching, Rash and Other (See Comments)    Has patient had a PCN reaction causing immediate rash, facial/tongue/throat swelling, SOB or lightheadedness with hypotension: Yes Has patient had a PCN reaction causing severe rash involving mucus membranes or skin necrosis: No Has patient had a PCN reaction that required hospitalization: No Has patient had a PCN reaction occurring within the last 10 years: No If all of the above answers are "NO", then may proceed with Cephalosporin use.  [name]  Sulfa Antibiotics Shortness Of Breath  . Spiriferis Other (See Comments)    Affected breathing  . Spiriva Handihaler [Tiotropium Bromide Monohydrate]     Affected breathing  . Ivp Dye [Iodinated Diagnostic Agents] Rash and Other (See Comments)    Redness    History of Present Illness    73 year old male with above complex past medical history including coronary artery disease status post three-vessel bypass in January 2014, hypertension, hyperlipidemia, type 2 diabetes  mellitus, diastolic dysfunction, paroxysmal atrial fibrillation, ectopic atrial tachycardia, and COPD.  In the setting of prior history of atrial arrhythmias, he is on chronic Eliquis.  He previously was on amiodarone however this was discontinued in August 2018 due to concern related to side effects.  He was not interested in catheter ablation for ectopic atrial tachycardia at that time and has since been managed with beta-blocker therapy.   He was last seen via telemedicine visit in May of this year.  Since his last visit, he reports having done well.  He has stable, chronic dyspnea on exertion without any recent change.  He notes frequent chest congestion but denies chest pain, palpitations, PND, orthopnea, dizziness, syncope, edema, or early satiety.  He checks his blood pressure and heart rate at home on a daily basis and typically notes blood pressures in the 120s with heart rates in the low 80s.  Over the past month, he has noted heart rates in the low 90s on a few occasions.  He is in atrial fibrillation today at a rate of 87 and is completely asymptomatic.  Home Medications    Prior to Admission medications   Medication Sig Start Date End Date Taking? Authorizing Provider  albuterol (PROVENTIL) (2.5 MG/3ML) 0.083% nebulizer solution Inhale 3 mLs into the lungs every 6 (six) hours as needed for wheezing or shortness of breath.  03/13/16  Yes [provider]  Albuterol Sulfate 108 (90 Base) MCG/ACT AEPB Inhale 2 puffs into the lungs every 6 (six) hours as needed (shortness of breath).    Yes [provider]  Artificial Tear Ointment (DRY EYES OP) Place 1 drop into both eyes daily as needed (for dry eyes).   Yes [provider]  atorvastatin (LIPITOR) 40 MG tablet Take 1 tablet by mouth once daily 09/30/18  Yes [name], MD  Blood Glucose Monitoring Suppl (ONE TOUCH ULTRA 2) w/Device KIT Use to check blood sugar as advised up to twice a day 03/10/19  Yes  [name], [name], DO  cetirizine (ZYRTEC) 10 MG tablet Take 10 mg by mouth daily.    Yes [provider]  diclofenac sodium (VOLTAREN) 1 % GEL Apply 2 g topically 3 (three) times daily as needed (Use on elbow bursitis and knee). 07/04/17  Yes [name], [name], DO  ELIQUIS 5 MG TABS tablet Take 1 tablet by mouth twice daily 11/27/18  Yes [name], MD  furosemide (LASIX) 40 MG tablet TAKE 1 TABLET BY MOUTH ONCE DAILY. APPOINTMENT NEEDED FOR FURTHER REFILLS. Patient taking differently: Every other day 12/31/18  Yes Dunn, Areta Haber, [name]  gabapentin (NEURONTIN) 100 MG capsule TAKE 1 CAPSULE BY MOUTH THREE TIMES DAILY 11/17/18  Yes [name], [name], DO  guaiFENesin (MUCINEX) 600 MG 12 hr tablet Take 600 mg by mouth 2 (two) times daily as needed.   Yes [provider]  LANCETS ULTRA THIN MISC 1 Device by Does not apply route 4 (four) times daily -  before meals and at bedtime. 05/17/12  Yes  [name], MD  levothyroxine (SYNTHROID) 88 MCG tablet Take 1 tablet by mouth once daily 03/16/19  Yes [name], [name], DO  losartan (COZAAR) 25 MG tablet Take 1 tablet by mouth once daily 03/31/19  Yes [name], MD  metFORMIN (GLUCOPHAGE) 500 MG tablet Take 2 tablets (1,000 mg total) by mouth 2 (two) times daily with a meal. 03/17/18  Yes [name], [name], DO  metoprolol tartrate (LOPRESSOR) 25 MG tablet TAKE 1 TABLET BY MOUTH TWICE DAILY . APPOINTMENT REQUIRED FOR FUTURE REFILLS 04/16/19  Yes [name], MD  montelukast (SINGULAIR) 10 MG tablet Take 10 mg by mouth at bedtime.  02/08/15  Yes [provider]  ONETOUCH ULTRA test strip USE TEST STRIP(S) TO CHECK GLUCOSE TWICE DAILY 12/10/18  Yes [name], [name], DO  predniSONE (DELTASONE) 5 MG tablet Take 5 mg by mouth every other day.  09/04/17  Yes [provider]  SYMBICORT 160-4.5 MCG/ACT inhaler Inhale 2 puffs 2 (two) times daily into the lungs.  08/11/16  Yes  [provider]    Review of Systems    Chronic dyspnea on exertion and chest congestion which is unchanged.  He denies chest pain, palpitations, PND, orthopnea, dizziness, syncope, edema, or early satiety.  All other systems reviewed and are otherwise negative except as noted above.  Physical Exam    VS:  BP 122/80 (BP Location: Left Arm, Patient Position: Sitting, Cuff Size: Normal)   Pulse 87   Ht _0  (1.803 m)   Wt 208 lb (94.3 kg)   SpO2 97%   BMI 29.01 kg/m  , BMI Body mass index is 29.01 kg/m. GEN: Well nourished, well developed, in no acute distress. HEENT: normal. Neck: Supple, no JVD, carotid bruits, or masses. Cardiac: IR, IR, distant, no murmurs, rubs, or gallops. No clubbing, cyanosis, edema.  Radials/PT 2+ and equal bilaterally.  Respiratory:  Respirations regular and unlabored, exp wheezing noted anteriorly.   GI: Soft, nontender, nondistended, BS + x 4. MS: no deformity or atrophy. Skin: warm and dry, no rash. Neuro:  Strength and sensation are intact. Psych: Normal affect.  Accessory Clinical Findings    ECG personally reviewed by me today -atrial fibrillation, 87, delayed R wave progression, nonspecific ST changes- no acute changes.  Lab Results  Component Value Date   WBC 6.2 06/24/2018   HGB 13.9 06/24/2018   HCT 41.4 06/24/2018   MCV 86.3 06/24/2018   PLT 195 06/24/2018   Lab Results  Component Value Date   CREATININE 1.02 06/24/2018   BUN 15 06/24/2018   NA 144 06/24/2018   K 4.3 06/24/2018   CL 109 06/24/2018   CO2 28 06/24/2018   Lab Results  Component Value Date   ALT 14 06/24/2018   AST 14 06/24/2018   ALKPHOS 81 12/06/2015   BILITOT 0.5 06/24/2018   Lab Results  Component Value Date   CHOL 125 06/24/2018   HDL 46 06/24/2018   LDLCALC 61 06/24/2018   TRIG 97 06/24/2018   CHOLHDL 2.7 06/24/2018    Lab Results  Component Value Date   HGBA1C 7.6 (H) 03/04/2019    Assessment & Plan    1.  Paroxysmal atrial  fibrillation/paroxysmal atrial tachycardia: Patient with a history of atrial arrhythmias following his bypass surgery in 2014 and he was on amiodarone until 2018.  He has since been on beta-blocker therapy and and is chronically anticoagulated with Eliquis.  He is in A. fib today and is well rate  controlled at 5.  He is completely asymptomatic.  He has chronic, stable dyspnea on exertion in the setting of COPD.  He checks his blood pressure and heart rate at home daily and notes heart rates typically in the low 80s though over the past month, he has noted low 90s on just a couple of occasions.  At no point was he symptomatic at home recently.  We discussed options for management potentially including placement of a Zio monitor to assess for overall burden with later plan for cardioversion +/-antiarrhythmic, versus ongoing rate control and anticoagulation in the setting of no symptoms.  He favors the latter option in that setting, I will continue metoprolol at 25 mg twice daily.  I will follow-up a CBC, basic metabolic panel, magnesium, and TSH today as it has been 10 months since his last check.  2.  Coronary artery disease: Patient has been stable since his last telemedicine visit in May.  He denies chest pain.  He has chronic dyspnea in the setting of COPD.  He remains on statin, beta-blocker, and ARB.  No aspirin in the setting of chronic Eliquis.  3.  Essential hypertension: Stable on beta-blocker and ARB.  4.  Hyperlipidemia: LDL was 61 in February with normal LFTs at that time.  He remains on high potency statin therapy.  5.  Type 2 diabetes mellitus: On Metformin and managed by primary care.  A1c recently 7.6.  6.  COPD: Some degree of chronic dyspnea exertion and chest congestion.  This is managed with inhaler therapy and overall he has been stable.  7.  Disposition: Follow-up in clinic in 4 to 6 months or sooner if necessary.  He will contact us if heart rates begin to elevate at home.    Murray Hodgkins, NP 04/17/2019, 9:18 AM

## 2019-04-18 ENCOUNTER — Telehealth: Payer: Self-pay

## 2019-04-18 ENCOUNTER — Ambulatory Visit: Payer: Medicare Other | Admitting: Nurse Practitioner

## 2019-04-18 LAB — BASIC METABOLIC PANEL
BUN/Creatinine Ratio: 15 (ref 10–24)
BUN: 17 mg/dL (ref 8–27)
CO2: 21 mmol/L (ref 20–29)
Calcium: 9 mg/dL (ref 8.6–10.2)
Chloride: 103 mmol/L (ref 96–106)
Creatinine, Ser: 1.11 mg/dL (ref 0.76–1.27)
GFR calc Af Amer: 76 mL/min/{1.73_m2} (ref >59)
GFR calc non Af Amer: 66 mL/min/{1.73_m2} (ref >59)
Glucose: 145 mg/dL — ABNORMAL HIGH (ref 65–99)
Potassium: 4.3 mmol/L (ref 3.5–5.2)
Sodium: 140 mmol/L (ref 134–144)

## 2019-04-18 LAB — CBC
Hematocrit: 44.2 % (ref 37.5–51.0)
Hemoglobin: 15.6 g/dL (ref 13.0–17.7)
MCH: 30.5 pg (ref 26.6–33.0)
MCHC: 35.3 g/dL (ref 31.5–35.7)
MCV: 87 fL (ref 79–97)
Platelets: 217 10*3/uL (ref 150–450)
RBC: 5.11 x10E6/uL (ref 4.14–5.80)
RDW: 13.5 % (ref 11.6–15.4)
WBC: 7.6 10*3/uL (ref 3.4–10.8)

## 2019-04-18 LAB — MAGNESIUM: Magnesium: 1.7 mg/dL (ref 1.6–2.3)

## 2019-04-18 LAB — TSH: TSH: 3.46 u[IU]/mL (ref 0.450–4.500)

## 2019-04-18 NOTE — Telephone Encounter (Signed)
-----   Message from Loel Dubonnet, NP sent at 04/18/2019  8:38 AM EST ----- Good result. CBC normal. Normal thyroid function. Normal kidney function, electrolytes including magnesium. Glucose mildly elevated 145, not unsurprising in setting of DM2.   Continue plan of care as discussed in office visit 04/18/19.

## 2019-04-18 NOTE — Telephone Encounter (Signed)
Call to patient to discuss results from labs.   No further orders at this time.   Advised pt to call for any further questions or concerns.

## 2019-04-21 ENCOUNTER — Ambulatory Visit: Payer: Self-pay | Admitting: *Deleted

## 2019-04-21 DIAGNOSIS — N182 Chronic kidney disease, stage 2 (mild): Secondary | ICD-10-CM

## 2019-04-21 DIAGNOSIS — E1142 Type 2 diabetes mellitus with diabetic polyneuropathy: Secondary | ICD-10-CM

## 2019-04-21 DIAGNOSIS — J449 Chronic obstructive pulmonary disease, unspecified: Secondary | ICD-10-CM

## 2019-04-21 DIAGNOSIS — I129 Hypertensive chronic kidney disease with stage 1 through stage 4 chronic kidney disease, or unspecified chronic kidney disease: Secondary | ICD-10-CM

## 2019-04-21 NOTE — Chronic Care Management (AMB) (Signed)
Chronic Care Management   Follow Up Note   04/21/2019 Name: Philip ZUVER Sr. MRN: [id number] DOB: 27-Sep-1945  Referred by: [name], DO Reason for referral : Chronic Care Management (DM2, COPD)   Philip Potters Sr. is a 73 y.o. year old male who is a primary care patient of [name], DO. The CCM team was consulted for assistance with chronic disease management and care coordination needs.    Review of patient status, including review of consultants reports, relevant laboratory and other test results, and collaboration with appropriate care team members and the patient's provider was performed as part of comprehensive patient evaluation and provision of chronic care management services.    SDOH (Social Determinants of Health) screening performed today: Physical Activity. See Care Plan for related entries.   Outpatient Encounter Medications as of 04/21/2019  Medication Sig  . albuterol (PROVENTIL) (2.5 MG/3ML) 0.083% nebulizer solution Inhale 3 mLs into the lungs every 6 (six) hours as needed for wheezing or shortness of breath.   . Albuterol Sulfate 108 (90 Base) MCG/ACT AEPB Inhale 2 puffs into the lungs every 6 (six) hours as needed (shortness of breath).   . Artificial Tear Ointment (DRY EYES OP) Place 1 drop into both eyes daily as needed (for dry eyes).  Philip [name] atorvastatin (LIPITOR) 40 MG tablet Take 1 tablet by mouth once daily  . Blood Glucose Monitoring Suppl (ONE TOUCH ULTRA 2) w/Device KIT Use to check blood sugar as advised up to twice a day  . cetirizine (ZYRTEC) 10 MG tablet Take 10 mg by mouth daily.   . diclofenac sodium (VOLTAREN) 1 % GEL Apply 2 g topically 3 (three) times daily as needed (Use on elbow bursitis and knee).  Philip [name] ELIQUIS 5 MG TABS tablet Take 1 tablet by mouth twice daily  . furosemide (LASIX) 40 MG tablet TAKE 1 TABLET BY MOUTH ONCE DAILY. APPOINTMENT NEEDED FOR FURTHER REFILLS. (Patient taking differently: Every other day)  .  gabapentin (NEURONTIN) 100 MG capsule TAKE 1 CAPSULE BY MOUTH THREE TIMES DAILY  . guaiFENesin (MUCINEX) 600 MG 12 hr tablet Take 600 mg by mouth 2 (two) times daily as needed.  Philip [name] LANCETS ULTRA THIN MISC 1 Device by Does not apply route 4 (four) times daily -  before meals and at bedtime.  Philip [name] levothyroxine (SYNTHROID) 88 MCG tablet Take 1 tablet by mouth once daily  . losartan (COZAAR) 25 MG tablet Take 1 tablet by mouth once daily  . metFORMIN (GLUCOPHAGE) 500 MG tablet Take 2 tablets (1,000 mg total) by mouth 2 (two) times daily with a meal.  . metoprolol tartrate (LOPRESSOR) 25 MG tablet TAKE 1 TABLET BY MOUTH TWICE DAILY . APPOINTMENT REQUIRED FOR FUTURE REFILLS  . montelukast (SINGULAIR) 10 MG tablet Take 10 mg by mouth at bedtime.   Philip [name] ULTRA test strip USE TEST STRIP(S) TO CHECK GLUCOSE TWICE DAILY  . predniSONE (DELTASONE) 5 MG tablet Take 5 mg by mouth every other day.   . SYMBICORT 160-4.5 MCG/ACT inhaler Inhale 2 puffs 2 (two) times daily into the lungs.    No facility-administered encounter medications on file as of 04/21/2019.     Goals Addressed   None   Current Barriers:  Philip [name] Knowledge deficits related to basic understanding of COPD disease process . Knowledge deficits related to basic COPD self care/management . Knowledge deficit related to basic understanding of how to use inhalers and how inhaled medications work   Case Manager Clinical Goal(s):  Over  the next 90 days patient will report using inhalers as prescribed including rinsing mouth after use  Over the next 90 days patient will report utilizing pursed lip breathing for shortness of breath  Over the next 90 days, patient will be able to verbalize understanding of COPD action plan and when to seek appropriate levels of medical care  Over the next 90 days, patient will engage in lite exercise as tolerated to build/regain stamina and strength and reduce shortness of breath through activity tolerance  Over the  next 90 days, patient will verbalize basic understanding of COPD disease process and self care activities  Over the next 90 days, patient will not be hospitalized for COPD exacerbation   Interventions:   Provided patient with basic written and verbal COPD education on self care/management/and exacerbation prevention   Provided patient with COPD action plan and reinforced importance of daily self assessment  Discussed Pulmonary Rehab and offered to assist with referral placement  Advised patient to self assesses COPD action plan zone and make appointment with provider if in the yellow zone for 48 hours without improvement.  Provided patient with education about the role of exercise in the management of COPD  Advised patient to engage in light exercise as tolerated 3-5 days a week   Reviewed COPD education and information about pulmonary rehab being open and benefits-Patient continues to want to wait on this, states at next pulmonary appointment patient has to have PFTs and testing, will make a determination at that time.  Talked with patient about recent follow ups with pulmonary, PCP and cardiology. Patient felt he was doing well, still having some mild congestion but overall well.   Discussed Holiday eating and maintaining sugar control.   Patient Self Care Activities:  Takes medications as prescribed including inhalers  Engages in light exercise 3-5 days a week   Please see past updates related to this goal by clicking on the "Past Updates" button in the selected goal      The care management team will reach out to the patient again over the next 60 days.  The patient has been provided with contact information for the care management team and has been advised to call with any health related questions or concerns.    Philip [name] Philip Zou RN, BSN Nurse Case Pharmacist, [facility]  [phone]) Business Mobile

## 2019-04-21 NOTE — Patient Instructions (Signed)
Thank you allowing the Chronic Care Management Team to be a part of your care! It was a pleasure speaking with you today!   CCM (Chronic Care Management) Team   Meredith Kilbride RN, BSN Nurse Care Coordinator  [phone]  [name] PharmD  Clinical Pharmacist  [phone]  Eula Fried LCSW Clinical Social Worker [phone]  Goals Addressed   None   Current Barriers:  [name] Knowledge deficits related to basic understanding of COPD disease process . Knowledge deficits related to basic COPD self care/management . Knowledge deficit related to basic understanding of how to use inhalers and how inhaled medications work   Case Manager Clinical Goal(s):  Over the next 90 days patient will report using inhalers as prescribed including rinsing mouth after use  Over the next 90 days patient will report utilizing pursed lip breathing for shortness of breath  Over the next 90 days, patient will be able to verbalize understanding of COPD action plan and when to seek appropriate levels of medical care  Over the next 90 days, patient will engage in lite exercise as tolerated to build/regain stamina and strength and reduce shortness of breath through activity tolerance  Over the next 90 days, patient will verbalize basic understanding of COPD disease process and self care activities  Over the next 90 days, patient will not be hospitalized for COPD exacerbation   Interventions:   Provided patient with basic written and verbal COPD education on self care/management/and exacerbation prevention   Provided patient with COPD action plan and reinforced importance of daily self assessment  Discussed Pulmonary Rehab and offered to assist with referral placement  Advised patient to self assesses COPD action plan zone and make appointment with provider if in the yellow zone for 48 hours without improvement.  Provided patient with education about the role of exercise in the management of  COPD  Advised patient to engage in light exercise as tolerated 3-5 days a week   Reviewed COPD education and information about pulmonary rehab being open and benefits-Patient continues to want to wait on this, states at next pulmonary appointment patient has to have PFTs and testing, will make a determination at that time.  Talked with patient about recent follow ups with pulmonary, PCP and cardiology. Patient felt he was doing well, still having some mild congestion but overall well.   Discussed Holiday eating and maintaining sugar control.   Patient Self Care Activities:  Takes medications as prescribed including inhalers  Engages in light exercise 3-5 days a week   Please see past updates related to this goal by clicking on the "Past Updates" button in the selected goal     The patient verbalized understanding of instructions provided today and declined a print copy of patient instruction materials.   The patient has been provided with contact information for the care management team and has been advised to call with any health related questions or concerns.

## 2019-05-02 DIAGNOSIS — A419 Sepsis, unspecified organism: Secondary | ICD-10-CM

## 2019-05-02 HISTORY — DX: Sepsis, unspecified organism: A41.9

## 2019-05-06 ENCOUNTER — Ambulatory Visit (INDEPENDENT_AMBULATORY_CARE_PROVIDER_SITE_OTHER): Payer: Medicare Other | Admitting: Pharmacist

## 2019-05-06 DIAGNOSIS — J449 Chronic obstructive pulmonary disease, unspecified: Secondary | ICD-10-CM

## 2019-05-06 DIAGNOSIS — J4489 Other specified chronic obstructive pulmonary disease: Secondary | ICD-10-CM

## 2019-05-06 DIAGNOSIS — E1142 Type 2 diabetes mellitus with diabetic polyneuropathy: Secondary | ICD-10-CM

## 2019-05-06 NOTE — Patient Instructions (Signed)
Thank you allowing the Chronic Care Management Team to be a part of your care! It was a pleasure speaking with you today!     CCM (Chronic Care Management) Team    Janci Minor RN, BSN Nurse Care Coordinator  [phone]   [name] PharmD  Clinical Pharmacist  ([place] LCSW Clinical Social Worker [phone]  Visit Information  Goals Addressed   None     The patient verbalized understanding of instructions provided today and declined a print copy of patient instruction materials.   Telephone follow up appointment with care management team member scheduled for: 1/26 at 1:30 pm   [name], PharmD, [place] [phone]

## 2019-05-06 NOTE — Chronic Care Management (AMB) (Signed)
Chronic Care Management   Follow Up Note   05/06/2019 Name: Philip FAUCETT Sr. MRN: [id number] DOB: 1945/10/01  Referred by: [name], DO Reason for referral : Chronic Care Management (Patient Phone Call)   Philip Potters Sr. is a 74 y.o. year old male who is a primary care patient of [name], DO. The CCM team was consulted for assistance with chronic disease management and care coordination needs. Mr. Aber has a past medical history including but not limited to type 2 diabetes, CAD, atrial fibrillation, hypothyroidism, OSA, hyperlipidemia, hypertension, CKD, seasonal allergies, knee OA and COPD.    I reached out to Philip Potters Sr. by phone today.   Review of patient status, including review of consultants reports, relevant laboratory and other test results, and collaboration with appropriate care team members and the patient's provider was performed as part of comprehensive patient evaluation and provision of chronic care management services.      Outpatient Encounter Medications as of 05/06/2019  Medication Sig  . Albuterol Sulfate 108 (90 Base) MCG/ACT AEPB Inhale 2 puffs into the lungs every 6 (six) hours as needed (shortness of breath).   [name] atorvastatin (LIPITOR) 40 MG tablet Take 1 tablet by mouth once daily  . cetirizine (ZYRTEC) 10 MG tablet Take 10 mg by mouth daily.   [name] ELIQUIS 5 MG TABS tablet Take 1 tablet by mouth twice daily  . fluticasone (FLONASE) 50 MCG/ACT nasal spray Place 1 spray into both nostrils daily as needed for allergies or rhinitis.  . furosemide (LASIX) 40 MG tablet TAKE 1 TABLET BY MOUTH ONCE DAILY. APPOINTMENT NEEDED FOR FURTHER REFILLS. (Patient taking differently: Every other day)  . losartan (COZAAR) 25 MG tablet Take 1 tablet by mouth once daily  . metFORMIN (GLUCOPHAGE) 500 MG tablet Take 2 tablets (1,000 mg total) by mouth 2 (two) times daily with a meal.  . metoprolol tartrate (LOPRESSOR) 25 MG tablet TAKE 1 TABLET BY  MOUTH TWICE DAILY . APPOINTMENT REQUIRED FOR FUTURE REFILLS  . montelukast (SINGULAIR) 10 MG tablet Take 10 mg by mouth at bedtime.   . predniSONE (DELTASONE) 5 MG tablet Take 5 mg by mouth every other day.   . SYMBICORT 160-4.5 MCG/ACT inhaler Inhale 2 puffs 2 (two) times daily into the lungs.   [name] albuterol (PROVENTIL) (2.5 MG/3ML) 0.083% nebulizer solution Inhale 3 mLs into the lungs every 6 (six) hours as needed for wheezing or shortness of breath.   . Artificial Tear Ointment (DRY EYES OP) Place 1 drop into both eyes daily as needed (for dry eyes).  . Blood Glucose Monitoring Suppl (ONE TOUCH ULTRA 2) w/Device KIT Use to check blood sugar as advised up to twice a day  . diclofenac sodium (VOLTAREN) 1 % GEL Apply 2 g topically 3 (three) times daily as needed (Use on elbow bursitis and knee).  . gabapentin (NEURONTIN) 100 MG capsule TAKE 1 CAPSULE BY MOUTH THREE TIMES DAILY  . guaiFENesin (MUCINEX) 600 MG 12 hr tablet Take 600 mg by mouth 2 (two) times daily as needed.  [name] LANCETS ULTRA THIN MISC 1 Device by Does not apply route 4 (four) times daily -  before meals and at bedtime.  [name] levothyroxine (SYNTHROID) 88 MCG tablet Take 1 tablet by mouth once daily  . ONETOUCH ULTRA test strip USE TEST STRIP(S) TO CHECK GLUCOSE TWICE DAILY   No facility-administered encounter medications on file as of 05/06/2019.    Goals Addressed   Current Barriers:  .  Knowledge deficits related to dietary choices to improve blood sugar control . Financial - currently in coverage gap of Medicare Part D plan  Pharmacist Clinical Goal(s):  [name] Over the next 30 days, patient will work with CM Pharmacist to address needs related to medication regimen optimization.  Interventions: . Provide patient with verbal COPD education on self care/management/and exacerbation prevention  o Confirms receiving written educational materials, including COPD action plan, as mailed by CM Nurse Case Manager o Confirms using pulmonary  medications as directed. [name] on importance of blood sugar monitoring and control o Confirms currently taking : metformin 500 mg - 2 tablets (1,000 mg) twice daily as directed - Denies any missed doses o Review recent blood sugar results (see below) - Reports dietary indiscretions over the holidays, but planning to now get back to having more consistent and well-balanced meals o Further discuss importance of diet and exercise in blood sugar control - Again discuss importance of limiting carbohydrate portion size in meals and snacks - Patient reports improved ability to lightly exercise with decreased knee pain. . Note patient received the third visco supplementation (GELSYN-3) injection on 12/15. [name] Counsel on importance of blood pressure/ HR monitoring and control o Review recent BP results (see below)  Patient Self Care Activities:  . Self administers medications as prescribed . Attends all scheduled provider appointments o Next appointment with Pulmonologist 04/14/19 o Next appointment with [facility] on 12/15 . Calls pharmacy for medication refills . Calls provider office for new concerns or questions  . Patient to check blood sugar regularly as directed and keep log Date Fasting Blood Glucose After Supper  (~2 hours)  30 - December 169 174  31 - December - -  1 - January 156 208  2 - January 168 234  3 - January 146 264  4 - January 142   5 - January 183   Average 161 220   . Patient to keep log when checks blood pressure Date  BP  1 - January 120/80, HR 82  2 - January 125/70, HR 84  3 - January 122/87, HR 85  4 - January 128/92, HR 85  5 - January 122/85, HR 63   . Patient will continue work on dietary modifications o Reducing portion sizes o Reducing total carbohydrate intake at each meal o Stopping eating when full, rather than when plate empty  Please see past updates related to this goal by clicking on the "Past Updates" button in the  selected goal       Plan  Telephone follow up appointment with care management team member scheduled for: 1/26 at 1:30 pm  [name], PharmD, [place] [phone]

## 2019-05-10 ENCOUNTER — Other Ambulatory Visit: Payer: Self-pay | Admitting: Family Medicine

## 2019-05-10 DIAGNOSIS — E119 Type 2 diabetes mellitus without complications: Secondary | ICD-10-CM

## 2019-05-22 ENCOUNTER — Other Ambulatory Visit: Payer: Self-pay | Admitting: Family Medicine

## 2019-05-22 ENCOUNTER — Other Ambulatory Visit: Payer: Self-pay | Admitting: Cardiovascular Disease

## 2019-05-22 DIAGNOSIS — E1142 Type 2 diabetes mellitus with diabetic polyneuropathy: Secondary | ICD-10-CM

## 2019-05-27 ENCOUNTER — Telehealth: Payer: Self-pay

## 2019-05-27 ENCOUNTER — Ambulatory Visit: Payer: Self-pay | Admitting: Pharmacist

## 2019-05-28 NOTE — Chronic Care Management (AMB) (Signed)
  Chronic Care Management   Follow Up Note   05/27/2019 Name: Philip GILLS Sr. MRN: [id number] DOB: 1945/07/22  Referred by: [name], DO Reason for referral : Chronic Care Management (Patient Phone Call)   Philip Potters Sr. is a 74 y.o. year old male who is a primary care patient of [name], DO. The CCM team was consulted for assistance with chronic disease management and care coordination needs.    I reached out to Philip Potters Sr. by phone today. Patient states that he is currently driving and states that he will call me back.   Plan   The care management team will reach out to the patient again over the next 14 days.   [name], PharmD, [place] [employer] [phone]

## 2019-06-02 ENCOUNTER — Telehealth: Payer: Self-pay

## 2019-06-03 ENCOUNTER — Ambulatory Visit (INDEPENDENT_AMBULATORY_CARE_PROVIDER_SITE_OTHER): Payer: Medicare Other | Admitting: Pharmacist

## 2019-06-03 DIAGNOSIS — E1142 Type 2 diabetes mellitus with diabetic polyneuropathy: Secondary | ICD-10-CM | POA: Diagnosis not present

## 2019-06-03 DIAGNOSIS — J449 Chronic obstructive pulmonary disease, unspecified: Secondary | ICD-10-CM

## 2019-06-03 NOTE — Patient Instructions (Signed)
Thank you allowing the Chronic Care Management Team to be a part of your care! It was a pleasure speaking with you today!     CCM (Chronic Care Management) Team    Noreene Larsson RN, MSN, CCM Nurse Care Coordinator  [phone]   [name] PharmD  Clinical Pharmacist  [phone]   Eula Fried LCSW Clinical Social Worker [phone]  Visit Information  Goals Addressed            This Visit's Progress   . PharmD - Medication Management       Current Barriers:  [name] Knowledge deficits related to dietary choices to improve blood sugar control . Financial - currently in coverage gap of Medicare Part D plan  Pharmacist Clinical Goal(s):  [name] Over the next 30 days, patient will work with CM Pharmacist to address needs related to medication regimen optimization.  Interventions: . Perform chart review. Note that patient contacted Pulmonologist's office on 1/29 concerned about efficacy of albuterol HFA from most recent refill. . Follow up with Mr. Etue regarding albuterol inhaler o Reports that he has not been able to get good relief from albuterol inhaler since last refill when manufacturer was changed. . Place coordination of care call to [place]. Speak with Mort Sawyers. o Patient received albuterol HFA inhaler (generic of Proventil form) manufactured by [employer] on 05/13/19. 3 inhalers dispensed. o States previously received albuterol HFA manufactured by [employer] (generic of Proair), last filled in June o Reports [employer], Biochemist, clinical, currently unable to provide any other albuterol HFA inhaler that is a generic equivalent to [employer] or Proventil at this time due to supply limitations, other than [employer] brand. . Place coordination of care call to [place] [phone]). Speak with Melvenia Needles. o Note [facility] uses [employer] as wholesaler o RPh Ronalee Belts reports that [facility] is able to stock alternative, [employer] brand of albuterol HFA inhaler (generic of  Proair) . Follow up with Mort Sawyers with [place]. RPh willing to take back patient's remaining 2 unopened inhalers, allowing prescription from to be re-run 1/12 as a 25 day supply and then transfer to [facility] ([place]) . Follow up call to Mr. Stumbo regarding inhaler. Patient states will return remaining 2 unopen [employer] brand albuterol HFA inhalers to [facility] today for refund of difference and then pick up new [employer]eva brand albuterol HFA inhaler from [facility] when ready. [name] on importance of blood sugar control and monitoring o Reports currently taking : metformin 500 mg - 2 tablets (1,000 mg) twice daily as directed - Denies any missed doses o Review recent blood sugar results (see below) o Patient expresses interest in the addition of a new medication to lower his blood sugar, provided that this option could be affordable to him.  - Note patient with previous suspected anaphylaxis reaction to Bydureon - Interested in patient assistance options - review options based on eligibility - Based on reported income, patient meets requirements for: o Patient Assistance Program for Januvia through [employer] - Note patient previously tolerated this medication . Counsel on importance of limiting carbohydrate portion size in meals and snacks. Mr. Steuer reports that he has recently been having larger portions of carbohydrates (recently made large amount of pasta). [name] on importance of blood pressure/ HR monitoring and control o Review recent BP results (see below) o Counsel patient on importance of contacting Cardiologist for readings outside of established parameters  Will collaborate with PCP to discuss the possible  restart of Januvia for improved blood sugar control  Patient Self Care Activities:  . Self administers medications as prescribed . Attends all scheduled provider appointments . Calls pharmacy for medication refills . Calls provider office for new concerns or  questions  . Patient to check blood sugar regularly as directed and keep log  Fasting Blood Glucose After Supper  (~2 hours)  27 - January 179 281  28 - January 159 314  29 - January 172 328  30 - January 199 270  31 - January 170 304  1 - February 152 367  2 - February 158 -  Average 170 311   . Patient to keep log when checks blood pressure  AM BP PM BP  27 - January 123/81, HR 77 124/83, HR 87  28 - January 115/86, HR 90 119/83, HR 86  29 - January 114/79, HR 91 135/84, HR 80  30 - January 126/85, HR 77 128/80, HR 78  31 - January 120/85, HR 88 99/71, HR 79  1 - February 118/86, HR 88 106/76, HR 95  2 - February 134/93, HR 81    . Patient will continue work on dietary modifications o Reducing portion sizes o Reducing total carbohydrate intake at each meal o Stopping eating when full, rather than when plate empty  Please see past updates related to this goal by clicking on the "Past Updates" button in the selected goal         The patient verbalized understanding of instructions provided today and declined a print copy of patient instruction materials.   The care management team will reach out to the patient again over the next 7 days.   [name], PharmD, [place] [employer] [phone]

## 2019-06-03 NOTE — Chronic Care Management (AMB) (Signed)
Chronic Care Management   Follow Up Note   06/03/2019 Name: Philip SMOAK Sr. MRN: [id number] DOB: May 31, 1945  Referred by: [name], DO Reason for referral : Chronic Care Management (Patient Phone Call) and Philip Lake Sr. is a 74 y.o. year old male who is a primary care patient of [name], DO. The CCM team was consulted for assistance with chronic disease management and care coordination needs.  [name] has a past medical history including but not limited to type 2 diabetes, CAD, atrial fibrillation, hypothyroidism, OSA, hyperlipidemia, hypertension, CKD, seasonal allergies, knee OA and COPD.    I reached out to Philip Potters Sr. by phone today.   Coordination of care calls to Philip [name].  Review of patient status, including review of consultants reports, relevant laboratory and other test results, and collaboration with appropriate care team members and the patient's provider was performed as part of comprehensive patient evaluation and provision of chronic care management services.    Objective  Lab Results  Component Value Date   HGBA1C 7.6 (H) 03/04/2019   Lab Results  Component Value Date   CREATININE 1.11 04/17/2019   BUN 17 04/17/2019   NA 140 04/17/2019   K 4.3 04/17/2019   CL 103 04/17/2019   CO2 21 04/17/2019    04/17/2019 eGFR: 66 mL/min   Outpatient Encounter Medications as of 06/03/2019  Medication Sig  . Albuterol Sulfate 108 (90 Base) MCG/ACT AEPB Inhale 2 puffs into the lungs every 6 (six) hours as needed (shortness of breath).   [name] atorvastatin (LIPITOR) 40 MG tablet Take 1 tablet by mouth once daily  . cetirizine (ZYRTEC) 10 MG tablet Take 10 mg by mouth daily.   [name] ELIQUIS 5 MG TABS tablet Take 1 tablet by mouth twice daily  . furosemide (LASIX) 40 MG tablet TAKE 1 TABLET BY MOUTH ONCE DAILY. APPOINTMENT NEEDED FOR FURTHER REFILLS. (Patient taking differently: Every other day)   . losartan (COZAAR) 25 MG tablet Take 1 tablet by mouth once daily  . metFORMIN (GLUCOPHAGE) 500 MG tablet TAKE 2 TABLETS BY MOUTH TWICE DAILY WITH A MEAL  . metoprolol tartrate (LOPRESSOR) 25 MG tablet TAKE 1 TABLET BY MOUTH TWICE DAILY . APPOINTMENT REQUIRED FOR FUTURE REFILLS  . montelukast (SINGULAIR) 10 MG tablet Take 10 mg by mouth at bedtime.   . predniSONE (DELTASONE) 5 MG tablet Take 5 mg by mouth every other day.   . SYMBICORT 160-4.5 MCG/ACT inhaler Inhale 2 puffs 2 (two) times daily into the lungs.   [name] albuterol (PROVENTIL) (2.5 MG/3ML) 0.083% nebulizer solution Inhale 3 mLs into the lungs every 6 (six) hours as needed for wheezing or shortness of breath.   . Artificial Tear Ointment (DRY EYES OP) Place 1 drop into both eyes daily as needed (for dry eyes).  . Blood Glucose Monitoring Suppl (ONE TOUCH ULTRA 2) w/Device KIT Use to check blood sugar as advised up to twice a day  . diclofenac sodium (VOLTAREN) 1 % GEL Apply 2 g topically 3 (three) times daily as needed (Use on elbow bursitis and knee).  . fluticasone (FLONASE) 50 MCG/ACT nasal spray Place 1 spray into both nostrils daily as needed for allergies or rhinitis.  [name] gabapentin (NEURONTIN) 100 MG capsule TAKE 1 CAPSULE BY MOUTH THREE TIMES DAILY  . guaiFENesin (MUCINEX) 600 MG 12 hr tablet Take 600 mg by mouth 2 (two) times daily as needed.  [name] Hooker  MISC 1 Device by Does not apply route 4 (four) times daily -  before meals and at bedtime.  [name] levothyroxine (SYNTHROID) 88 MCG tablet Take 1 tablet by mouth once daily  . ONETOUCH ULTRA test strip USE TEST STRIP(S) TO CHECK GLUCOSE TWICE DAILY   No facility-administered encounter medications on file as of 06/03/2019.    Goals Addressed            This Visit's Progress   . PharmD - Medication Management       Current Barriers:  [name] Knowledge deficits related to dietary choices to improve blood sugar control . Financial - currently in coverage gap of Medicare Part D  plan  Pharmacist Clinical Goal(s):  [name] Over the next 30 days, patient will work with CM Pharmacist to address needs related to medication regimen optimization.  Interventions: . Perform chart review. Note that patient contacted Pulmonologist's office on 1/29 concerned about efficacy of albuterol HFA from most recent refill. . Follow up with Mr. Maneri regarding albuterol inhaler o Reports that he has not been able to get good relief from albuterol inhaler since last refill when manufacturer was changed. . Place coordination of care call to [place]. Speak with Mort Sawyers. o Patient received albuterol HFA inhaler (generic of Proventil form) manufactured by [employer] on 05/13/19. 3 inhalers dispensed. o States previously received albuterol HFA manufactured by [employer] (generic of Proair), last filled in June o Reports [employer], Biochemist, clinical, currently unable to provide any other albuterol HFA inhaler that is a generic equivalent to [employer] or Proventil at this time due to supply limitations, other than [employer] brand. . Place coordination of care call to [place] [phone]). Speak with Melvenia Needles. o Note [facility] uses [employer] as wholesaler o RPh Ronalee Belts reports [facility] able to [facility] alternative, [employer]eva brand of albuterol HFA inhaler (generic of Proair) . Follow up with Mort Sawyers with [place]. RPh willing to take back patient's remaining 2 unopened inhalers, allowing prescription from to be re-run 1/12 as a 25 day supply and then transfer to [place] ([place] . Follow up call to Mr. Queenan regarding inhaler. Patient states will return remaining 2 unopen [employer] brand albuterol HFA inhalers to [place] today for refund of difference and then pick up new Teva brand albuterol HFA inhaler from [facility] when ready. Philip [name] on importance of blood sugar control and monitoring o Reports currently taking : metformin 500 mg - 2 tablets (1,000 mg) twice daily  as directed - Denies any missed doses o Review recent blood sugar results (see below) o Patient expresses interest in the addition of a new medication to lower his blood sugar, provided that this option could be affordable to him.  - Note patient with previous suspected anaphylaxis reaction to Bydureon - Interested in patient assistance options - review options based on eligibility - Based on reported income, patient meets requirements for: o Patient Assistance Program for Januvia through [employer] - Note patient previously tolerated this medication . Counsel on importance of limiting carbohydrate portion size in meals and snacks. Mr. Detweiler reports that he has recently been having larger portions of carbohydrates (recently made large amount of pasta). Philip [name] on importance of blood pressure/ HR monitoring and control o Review recent BP results (see below) o Counsel patient on importance of contacting Cardiologist for readings outside of established parameters  Will collaborate with PCP to discuss the possible restart of Januvia for improved blood sugar control  Patient Self Care Activities:  . Self administers medications as prescribed . Attends all scheduled provider appointments . Calls pharmacy for medication refills . Calls provider office for new concerns or questions  . Patient to check blood sugar regularly as directed and keep log  Fasting Blood Glucose After Supper  (~2 hours)  27 - January 179 281  28 - January 159 314  29 - January 172 328  30 - January 199 270  31 - January 170 304  1 - February 152 367  2 - February 158 -  Average 170 311   . Patient to keep log when checks blood pressure  AM BP PM BP  27 - January 123/81, HR 77 124/83, HR 87  28 - January 115/86, HR 90 119/83, HR 86  29 - January 114/79, HR 91 135/84, HR 80  30 - January 126/85, HR 77 128/80, HR 78  31 - January 120/85, HR 88 99/71, HR 79  1 - February 118/86, HR 88 106/76, HR 95  2 - February  134/93, HR 81    . Patient will continue work on dietary modifications o Reducing portion sizes o Reducing total carbohydrate intake at each meal o Stopping eating when full, rather than when plate empty  Please see past updates related to this goal by clicking on the "Past Updates" button in the selected goal         Plan  The care management team will reach out to the patient again over the next 7 days.   [name], PharmD, [place] [employer] [phone]

## 2019-06-04 ENCOUNTER — Ambulatory Visit: Payer: Self-pay | Admitting: Pharmacist

## 2019-06-04 DIAGNOSIS — J449 Chronic obstructive pulmonary disease, unspecified: Secondary | ICD-10-CM | POA: Diagnosis not present

## 2019-06-04 DIAGNOSIS — E1142 Type 2 diabetes mellitus with diabetic polyneuropathy: Secondary | ICD-10-CM

## 2019-06-05 ENCOUNTER — Other Ambulatory Visit: Payer: Self-pay | Admitting: Pharmacy Technician

## 2019-06-05 NOTE — Chronic Care Management (AMB) (Signed)
Chronic Care Management   Follow Up Note   06/04/2019 Name: Philip AMEDEE Sr. MRN: [id number] DOB: 10-12-1945  Referred by: [name], DO Reason for referral : Chronic Care Management (Patient Phone Call)   Philip Potters Sr. is a 74 y.o. year old male who is a primary care patient of [name], DO. The CCM team was consulted for assistance with chronic disease management and care coordination needs.  Philip [name] has a past medical history including but not limited to type 2 diabetes, CAD, atrial fibrillation, hypothyroidism, OSA, hyperlipidemia, hypertension, CKD, seasonal allergies, knee OA and COPD.  I reached out to Philip Potters Sr. by phone today.   Review of patient status, including review of consultants reports, relevant laboratory and other test results, and collaboration with appropriate care team members and the patient's provider was performed as part of comprehensive patient evaluation and provision of chronic care management services.     Outpatient Encounter Medications as of 06/04/2019  Medication Sig  . albuterol (PROVENTIL) (2.5 MG/3ML) 0.083% nebulizer solution Inhale 3 mLs into the lungs every 6 (six) hours as needed for wheezing or shortness of breath.   . Albuterol Sulfate 108 (90 Base) MCG/ACT AEPB Inhale 2 puffs into the lungs every 6 (six) hours as needed (shortness of breath).   . Artificial Tear Ointment (DRY EYES OP) Place 1 drop into both eyes daily as needed (for dry eyes).  [name] atorvastatin (LIPITOR) 40 MG tablet Take 1 tablet by mouth once daily  . Blood Glucose Monitoring Suppl (ONE TOUCH ULTRA 2) w/Device KIT Use to check blood sugar as advised up to twice a day  . cetirizine (ZYRTEC) 10 MG tablet Take 10 mg by mouth daily.   . diclofenac sodium (VOLTAREN) 1 % GEL Apply 2 g topically 3 (three) times daily as needed (Use on elbow bursitis and knee).  [name] ELIQUIS 5 MG TABS tablet Take 1 tablet by mouth twice daily  . fluticasone  (FLONASE) 50 MCG/ACT nasal spray Place 1 spray into both nostrils daily as needed for allergies or rhinitis.  . furosemide (LASIX) 40 MG tablet TAKE 1 TABLET BY MOUTH ONCE DAILY. APPOINTMENT NEEDED FOR FURTHER REFILLS. (Patient taking differently: Every other day)  . gabapentin (NEURONTIN) 100 MG capsule TAKE 1 CAPSULE BY MOUTH THREE TIMES DAILY  . guaiFENesin (MUCINEX) 600 MG 12 hr tablet Take 600 mg by mouth 2 (two) times daily as needed.  [name] LANCETS ULTRA THIN MISC 1 Device by Does not apply route 4 (four) times daily -  before meals and at bedtime.  [name] levothyroxine (SYNTHROID) 88 MCG tablet Take 1 tablet by mouth once daily  . losartan (COZAAR) 25 MG tablet Take 1 tablet by mouth once daily  . metFORMIN (GLUCOPHAGE) 500 MG tablet TAKE 2 TABLETS BY MOUTH TWICE DAILY WITH A MEAL  . metoprolol tartrate (LOPRESSOR) 25 MG tablet TAKE 1 TABLET BY MOUTH TWICE DAILY . APPOINTMENT REQUIRED FOR FUTURE REFILLS  . montelukast (SINGULAIR) 10 MG tablet Take 10 mg by mouth at bedtime.   Glory Rosebush ULTRA test strip USE TEST STRIP(S) TO CHECK GLUCOSE TWICE DAILY  . predniSONE (DELTASONE) 5 MG tablet Take 5 mg by mouth every other day.   . SYMBICORT 160-4.5 MCG/ACT inhaler Inhale 2 puffs 2 (two) times daily into the lungs.    No facility-administered encounter medications on file as of 06/04/2019.    Goals Addressed            This Visit's  Progress   . PharmD - Medication Management       Current Barriers:  [name] Knowledge deficits related to dietary choices to improve blood sugar control . Financial - currently in coverage gap of Medicare Part D plan  Pharmacist Clinical Goal(s):  [name] Over the next 30 days, patient will work with CM Pharmacist to address needs related to medication regimen optimization.  Interventions: . Collaborated with PCP to recommend restart of Januvia for improved blood sugar control o Provider is agreeable to restart of Januvia 100 mg once daily when patient is ready . Follow up  with patient regarding option to restart Januvia 100 mg once daily. Patient is interested in restarting this medication once he can receive the medication from patient assistance program . Will collaborate with Philip [name] to request she outreach to patient to assist him with applying for patient assistance for Januvia from [employer]. . Follow up with patient regarding albuterol inhaler. Philip [name] confirms picked up new Teva brand albuterol HFA inhaler [facility]. States that he has not yet used it, but will follow up with Pulmonology for further evaluation if he does not find this rescue inhaler to be effective . Encourage patient to continue to monitor BP and HR as directed by Cardiologist and to call provider for readings outside established parameters  Patient Self Care Activities:  . Self administers medications as prescribed . Attends all scheduled provider appointments . Calls pharmacy for medication refills . Calls provider office for new concerns or questions  . Patient to check blood sugar regularly as directed and keep log . Patient to keep log when checks blood pressure . Patient will continue work on dietary modifications o Reducing portion sizes o Reducing total carbohydrate intake at each meal o Stopping eating when full, rather than when plate empty  Please see past updates related to this goal by clicking on the "Past Updates" button in the selected goal         Plan  Telephone follow up appointment with care management team member scheduled for: 3/1 at 2:30 pm  [name], PharmD, [place] [phone]

## 2019-06-05 NOTE — Patient Outreach (Signed)
[facility]) Care Management  06/05/2019  Philip [name] Sr. 08-29-1945 [id number]                                        Medication Assistance Referral  Referral From: [place] RPh Dorthula Perfect   Medication/Company: Celesta Gentile / [employer] Patient application portion:  Mailed Provider application portion: Interoffice Mailed to Dr. [name] Provider address/fax verified via: Office website     Follow up:  Will follow up with patient in 5-15 business days to confirm application(s) have been received.  Irby Fails P. Kaybree Williams, [place] Management [phone]

## 2019-06-05 NOTE — Patient Instructions (Signed)
Thank you allowing the Chronic Care Management Team to be a part of your care! It was a pleasure speaking with you today!     CCM (Chronic Care Management) Team    Noreene Larsson RN, MSN, CCM Nurse Care Coordinator  [phone]   [name] PharmD  Clinical Pharmacist  [phone]   Eula Fried LCSW Clinical Social Worker [phone]  Visit Information  Goals Addressed            This Visit's Progress   . PharmD - Medication Management       Current Barriers:  [name] Knowledge deficits related to dietary choices to improve blood sugar control . Financial - currently in coverage gap of Medicare Part D plan  Pharmacist Clinical Goal(s):  [name] Over the next 30 days, patient will work with CM Pharmacist to address needs related to medication regimen optimization.  Interventions: . Collaborated with PCP to recommend restart of Januvia for improved blood sugar control o Provider is agreeable to restart of Januvia 100 mg once daily when patient is ready . Follow up with patient regarding option to restart Januvia 100 mg once daily. Patient is interested in restarting this medication once he can receive the medication from patient assistance program . Will collaborate with Heppner Simcox to request she outreach to patient to assist him with applying for patient assistance for Januvia from [employer]. . Follow up with patient regarding albuterol inhaler. Mr. Trivino confirms picked up new Teva brand albuterol HFA inhaler [facility]. States that he has not yet used it, but will follow up with Pulmonology for further evaluation if he does not find this rescue inhaler to be effective . Encourage patient to continue to monitor BP and HR as directed by Cardiologist and to call provider for readings outside established parameters  Patient Self Care Activities:  . Self administers medications as prescribed . Attends all scheduled provider appointments . Calls pharmacy for medication  refills . Calls provider office for new concerns or questions  . Patient to check blood sugar regularly as directed and keep log . Patient to keep log when checks blood pressure . Patient will continue work on dietary modifications o Reducing portion sizes o Reducing total carbohydrate intake at each meal o Stopping eating when full, rather than when plate empty  Please see past updates related to this goal by clicking on the "Past Updates" button in the selected goal         The patient verbalized understanding of instructions provided today and declined a print copy of patient instruction materials.   Telephone follow up appointment with care management team member scheduled for: 3/1 at 2:30 pm  [name], PharmD, [place] [phone]

## 2019-06-19 ENCOUNTER — Other Ambulatory Visit: Payer: Self-pay | Admitting: Pharmacy Technician

## 2019-06-19 ENCOUNTER — Telehealth: Payer: Self-pay

## 2019-06-19 NOTE — Patient Outreach (Signed)
[facility]) Care Management  06/19/2019  Philip CHRETIEN Sr. Dec 22, 1945 [id number]  Successful outreach attempt made to patient in regards to [employer] application for [place].  Spoke to patient, HIPAA identifiers verified.  Patient informed he was currently driving down the road and that this was not a good time to talk. Patient informed he would call me back when gets home. Confirmed patient had name and number.  Will follow up with patient in 5-7 business days if call is not returned.  Emanuelle Bastos P. Robinson Brinkley, [place] Management [phone]

## 2019-06-19 NOTE — Telephone Encounter (Signed)
Verbal  given to the pharmacy regarding brand change from [place] for levothyroxin for 88 mcg.

## 2019-06-23 ENCOUNTER — Other Ambulatory Visit: Payer: Self-pay | Admitting: Pharmacy Technician

## 2019-06-23 NOTE — Patient Outreach (Signed)
[facility]) Care Management  06/23/2019  Philip MCKELLIPS Sr. 08/12/1945 [id number]    Successful call placed to patient regarding patient assistance application(s) for Januvia with [employer] , HIPAA identifiers verified.   Patient informed he received the application. He inquired about the return envelope and wanted to make sure it was the correct place to mail the application. Informed patient the return envelope was correct. Patient informed he would place in the mail this week.  Follow up:  Will route note to embedded [facility] RPh [name] for case closure if document(s) have not been received in the next 15 business days.  Clarice Bonaventure P. Zackariah Vanderpol, [place] Management [phone]

## 2019-06-26 ENCOUNTER — Inpatient Hospital Stay
Admission: EM | Admit: 2019-06-26 | Discharge: 2019-07-05 | DRG: 871 | Disposition: A | Discharge status: Home Health | Payer: Medicare Other | Attending: Internal Medicine | Admitting: Internal Medicine

## 2019-06-26 ENCOUNTER — Emergency Department: Payer: Medicare Other

## 2019-06-26 ENCOUNTER — Other Ambulatory Visit: Payer: Self-pay | Admitting: Pharmacy Technician

## 2019-06-26 ENCOUNTER — Other Ambulatory Visit: Payer: Self-pay

## 2019-06-26 DIAGNOSIS — R490 Dysphonia: Secondary | ICD-10-CM | POA: Diagnosis not present

## 2019-06-26 DIAGNOSIS — A4101 Sepsis due to Methicillin susceptible Staphylococcus aureus: Secondary | ICD-10-CM | POA: Diagnosis not present

## 2019-06-26 DIAGNOSIS — I5022 Chronic systolic (congestive) heart failure: Secondary | ICD-10-CM | POA: Diagnosis not present

## 2019-06-26 DIAGNOSIS — Z79890 Hormone replacement therapy (postmenopausal): Secondary | ICD-10-CM

## 2019-06-26 DIAGNOSIS — A419 Sepsis, unspecified organism: Secondary | ICD-10-CM | POA: Diagnosis not present

## 2019-06-26 DIAGNOSIS — R000 Tachycardia, unspecified: Secondary | ICD-10-CM | POA: Diagnosis not present

## 2019-06-26 DIAGNOSIS — E1169 Type 2 diabetes mellitus with other specified complication: Secondary | ICD-10-CM | POA: Diagnosis not present

## 2019-06-26 DIAGNOSIS — J449 Chronic obstructive pulmonary disease, unspecified: Secondary | ICD-10-CM | POA: Diagnosis not present

## 2019-06-26 DIAGNOSIS — R579 Shock, unspecified: Secondary | ICD-10-CM | POA: Diagnosis present

## 2019-06-26 DIAGNOSIS — N179 Acute kidney failure, unspecified: Secondary | ICD-10-CM | POA: Diagnosis not present

## 2019-06-26 DIAGNOSIS — I130 Hypertensive heart and chronic kidney disease with heart failure and stage 1 through stage 4 chronic kidney disease, or unspecified chronic kidney disease: Secondary | ICD-10-CM | POA: Diagnosis not present

## 2019-06-26 DIAGNOSIS — I2510 Atherosclerotic heart disease of native coronary artery without angina pectoris: Secondary | ICD-10-CM | POA: Diagnosis present

## 2019-06-26 DIAGNOSIS — I4819 Other persistent atrial fibrillation: Secondary | ICD-10-CM | POA: Diagnosis present

## 2019-06-26 DIAGNOSIS — Z7951 Long term (current) use of inhaled steroids: Secondary | ICD-10-CM

## 2019-06-26 DIAGNOSIS — E7800 Pure hypercholesterolemia, unspecified: Secondary | ICD-10-CM | POA: Diagnosis present

## 2019-06-26 DIAGNOSIS — E1165 Type 2 diabetes mellitus with hyperglycemia: Secondary | ICD-10-CM | POA: Diagnosis not present

## 2019-06-26 DIAGNOSIS — I4891 Unspecified atrial fibrillation: Secondary | ICD-10-CM

## 2019-06-26 DIAGNOSIS — E872 Acidosis: Secondary | ICD-10-CM | POA: Diagnosis not present

## 2019-06-26 DIAGNOSIS — R0602 Shortness of breath: Secondary | ICD-10-CM

## 2019-06-26 DIAGNOSIS — Z951 Presence of aortocoronary bypass graft: Secondary | ICD-10-CM

## 2019-06-26 DIAGNOSIS — N182 Chronic kidney disease, stage 2 (mild): Secondary | ICD-10-CM | POA: Diagnosis not present

## 2019-06-26 DIAGNOSIS — J440 Chronic obstructive pulmonary disease with acute lower respiratory infection: Secondary | ICD-10-CM | POA: Diagnosis not present

## 2019-06-26 DIAGNOSIS — R6521 Severe sepsis with septic shock: Secondary | ICD-10-CM | POA: Diagnosis not present

## 2019-06-26 DIAGNOSIS — Z79899 Other long term (current) drug therapy: Secondary | ICD-10-CM

## 2019-06-26 DIAGNOSIS — J3089 Other allergic rhinitis: Secondary | ICD-10-CM | POA: Diagnosis not present

## 2019-06-26 DIAGNOSIS — R7881 Bacteremia: Secondary | ICD-10-CM

## 2019-06-26 DIAGNOSIS — Z880 Allergy status to penicillin: Secondary | ICD-10-CM

## 2019-06-26 DIAGNOSIS — E876 Hypokalemia: Secondary | ICD-10-CM | POA: Diagnosis present

## 2019-06-26 DIAGNOSIS — I5042 Chronic combined systolic (congestive) and diastolic (congestive) heart failure: Secondary | ICD-10-CM | POA: Diagnosis present

## 2019-06-26 DIAGNOSIS — Z882 Allergy status to sulfonamides status: Secondary | ICD-10-CM

## 2019-06-26 DIAGNOSIS — R6520 Severe sepsis without septic shock: Secondary | ICD-10-CM | POA: Diagnosis not present

## 2019-06-26 DIAGNOSIS — E785 Hyperlipidemia, unspecified: Secondary | ICD-10-CM | POA: Diagnosis present

## 2019-06-26 DIAGNOSIS — I420 Dilated cardiomyopathy: Secondary | ICD-10-CM | POA: Diagnosis present

## 2019-06-26 DIAGNOSIS — Z20822 Contact with and (suspected) exposure to covid-19: Secondary | ICD-10-CM | POA: Diagnosis present

## 2019-06-26 DIAGNOSIS — I361 Nonrheumatic tricuspid (valve) insufficiency: Secondary | ICD-10-CM | POA: Diagnosis not present

## 2019-06-26 DIAGNOSIS — I081 Rheumatic disorders of both mitral and tricuspid valves: Secondary | ICD-10-CM | POA: Diagnosis present

## 2019-06-26 DIAGNOSIS — Z7901 Long term (current) use of anticoagulants: Secondary | ICD-10-CM

## 2019-06-26 DIAGNOSIS — E1142 Type 2 diabetes mellitus with diabetic polyneuropathy: Secondary | ICD-10-CM

## 2019-06-26 DIAGNOSIS — I480 Paroxysmal atrial fibrillation: Secondary | ICD-10-CM | POA: Diagnosis not present

## 2019-06-26 DIAGNOSIS — R042 Hemoptysis: Secondary | ICD-10-CM | POA: Diagnosis not present

## 2019-06-26 DIAGNOSIS — J189 Pneumonia, unspecified organism: Secondary | ICD-10-CM | POA: Diagnosis not present

## 2019-06-26 DIAGNOSIS — J9601 Acute respiratory failure with hypoxia: Secondary | ICD-10-CM | POA: Diagnosis not present

## 2019-06-26 DIAGNOSIS — Z7984 Long term (current) use of oral hypoglycemic drugs: Secondary | ICD-10-CM

## 2019-06-26 DIAGNOSIS — Z8249 Family history of ischemic heart disease and other diseases of the circulatory system: Secondary | ICD-10-CM

## 2019-06-26 DIAGNOSIS — B370 Candidal stomatitis: Secondary | ICD-10-CM

## 2019-06-26 DIAGNOSIS — I5020 Unspecified systolic (congestive) heart failure: Secondary | ICD-10-CM | POA: Diagnosis not present

## 2019-06-26 DIAGNOSIS — J15211 Pneumonia due to Methicillin susceptible Staphylococcus aureus: Secondary | ICD-10-CM | POA: Diagnosis not present

## 2019-06-26 DIAGNOSIS — Z91041 Radiographic dye allergy status: Secondary | ICD-10-CM

## 2019-06-26 DIAGNOSIS — Z7952 Long term (current) use of systemic steroids: Secondary | ICD-10-CM

## 2019-06-26 DIAGNOSIS — I340 Nonrheumatic mitral (valve) insufficiency: Secondary | ICD-10-CM | POA: Diagnosis not present

## 2019-06-26 DIAGNOSIS — B9561 Methicillin susceptible Staphylococcus aureus infection as the cause of diseases classified elsewhere: Secondary | ICD-10-CM

## 2019-06-26 DIAGNOSIS — Z881 Allergy status to other antibiotic agents status: Secondary | ICD-10-CM

## 2019-06-26 DIAGNOSIS — J441 Chronic obstructive pulmonary disease with (acute) exacerbation: Secondary | ICD-10-CM | POA: Diagnosis present

## 2019-06-26 DIAGNOSIS — G4733 Obstructive sleep apnea (adult) (pediatric): Secondary | ICD-10-CM | POA: Diagnosis not present

## 2019-06-26 DIAGNOSIS — J4540 Moderate persistent asthma, uncomplicated: Secondary | ICD-10-CM | POA: Diagnosis not present

## 2019-06-26 DIAGNOSIS — I959 Hypotension, unspecified: Secondary | ICD-10-CM | POA: Diagnosis not present

## 2019-06-26 DIAGNOSIS — Z888 Allergy status to other drugs, medicaments and biological substances status: Secondary | ICD-10-CM

## 2019-06-26 DIAGNOSIS — Z452 Encounter for adjustment and management of vascular access device: Secondary | ICD-10-CM

## 2019-06-26 DIAGNOSIS — H9190 Unspecified hearing loss, unspecified ear: Secondary | ICD-10-CM | POA: Diagnosis present

## 2019-06-26 DIAGNOSIS — R778 Other specified abnormalities of plasma proteins: Secondary | ICD-10-CM | POA: Diagnosis not present

## 2019-06-26 DIAGNOSIS — Z87891 Personal history of nicotine dependence: Secondary | ICD-10-CM

## 2019-06-26 DIAGNOSIS — I10 Essential (primary) hypertension: Secondary | ICD-10-CM | POA: Diagnosis not present

## 2019-06-26 DIAGNOSIS — Z981 Arthrodesis status: Secondary | ICD-10-CM

## 2019-06-26 DIAGNOSIS — E039 Hypothyroidism, unspecified: Secondary | ICD-10-CM | POA: Diagnosis not present

## 2019-06-26 DIAGNOSIS — R0902 Hypoxemia: Secondary | ICD-10-CM | POA: Diagnosis not present

## 2019-06-26 DIAGNOSIS — E1122 Type 2 diabetes mellitus with diabetic chronic kidney disease: Secondary | ICD-10-CM | POA: Diagnosis present

## 2019-06-26 DIAGNOSIS — Z743 Need for continuous supervision: Secondary | ICD-10-CM | POA: Diagnosis not present

## 2019-06-26 LAB — COMPREHENSIVE METABOLIC PANEL
ALT: 24 U/L (ref 0–44)
AST: 33 U/L (ref 15–41)
Albumin: 3.3 g/dL — ABNORMAL LOW (ref 3.5–5.0)
Alkaline Phosphatase: 80 U/L (ref 38–126)
Anion gap: 9 (ref 5–15)
BUN: 24 mg/dL — ABNORMAL HIGH (ref 8–23)
CO2: 21 mmol/L — ABNORMAL LOW (ref 22–32)
Calcium: 7.9 mg/dL — ABNORMAL LOW (ref 8.9–10.3)
Chloride: 110 mmol/L (ref 98–111)
Creatinine, Ser: 1.44 mg/dL — ABNORMAL HIGH (ref 0.61–1.24)
GFR calc Af Amer: 56 mL/min — ABNORMAL LOW (ref >60)
GFR calc non Af Amer: 48 mL/min — ABNORMAL LOW (ref >60)
Glucose, Bld: 194 mg/dL — ABNORMAL HIGH (ref 70–99)
Potassium: 3.2 mmol/L — ABNORMAL LOW (ref 3.5–5.1)
Sodium: 140 mmol/L (ref 135–145)
Total Bilirubin: 1 mg/dL (ref 0.3–1.2)
Total Protein: 4.9 g/dL — ABNORMAL LOW (ref 6.5–8.1)

## 2019-06-26 LAB — CBC WITH DIFFERENTIAL/PLATELET
Abs Immature Granulocytes: 0.01 10*3/uL (ref 0.00–0.07)
Basophils Absolute: 0 10*3/uL (ref 0.0–0.1)
Basophils Relative: 0 %
Eosinophils Absolute: 0.1 10*3/uL (ref 0.0–0.5)
Eosinophils Relative: 2 %
HCT: 40.4 % (ref 39.0–52.0)
Hemoglobin: 13.9 g/dL (ref 13.0–17.0)
Immature Granulocytes: 0 %
Lymphocytes Relative: 18 %
Lymphs Abs: 0.7 10*3/uL (ref 0.7–4.0)
MCH: 30.6 pg (ref 26.0–34.0)
MCHC: 34.4 g/dL (ref 30.0–36.0)
MCV: 89 fL (ref 80.0–100.0)
Monocytes Absolute: 0.3 10*3/uL (ref 0.1–1.0)
Monocytes Relative: 8 %
Neutro Abs: 2.9 10*3/uL (ref 1.7–7.7)
Neutrophils Relative %: 72 %
Platelets: 162 10*3/uL (ref 150–400)
RBC: 4.54 MIL/uL (ref 4.22–5.81)
RDW: 12.9 % (ref 11.5–15.5)
WBC: 4 10*3/uL (ref 4.0–10.5)
nRBC: 0 % (ref 0.0–0.2)

## 2019-06-26 LAB — APTT: aPTT: 30 seconds (ref 24–36)

## 2019-06-26 LAB — LACTIC ACID, PLASMA: Lactic Acid, Venous: 2.6 mmol/L (ref 0.5–1.9)

## 2019-06-26 LAB — PROTIME-INR
INR: 1.6 — ABNORMAL HIGH (ref 0.8–1.2)
Prothrombin Time: 18.6 seconds — ABNORMAL HIGH (ref 11.4–15.2)

## 2019-06-26 LAB — POC SARS CORONAVIRUS 2 AG: SARS Coronavirus 2 Ag: NEGATIVE

## 2019-06-26 LAB — PROCALCITONIN: Procalcitonin: 7.19 ng/mL

## 2019-06-26 LAB — BRAIN NATRIURETIC PEPTIDE: B Natriuretic Peptide: 583 pg/mL — ABNORMAL HIGH (ref 0.0–100.0)

## 2019-06-26 LAB — TROPONIN I (HIGH SENSITIVITY): Troponin I (High Sensitivity): 8 ng/L (ref <18)

## 2019-06-26 MED ORDER — METHYLPREDNISOLONE SODIUM SUCC 125 MG IJ SOLR
60.0000 mg | Freq: Three times a day (TID) | INTRAMUSCULAR | Status: AC
  Administered 2019-06-27 (×3): 60 mg via INTRAVENOUS
  Filled 2019-06-26 (×3): qty 2

## 2019-06-26 MED ORDER — SODIUM CHLORIDE 0.9 % IV SOLN: 2.0000 g | Freq: Two times a day (BID) | INTRAVENOUS | Status: DC

## 2019-06-26 MED ORDER — GUAIFENESIN ER 600 MG PO TB12
600.0000 mg | ORAL_TABLET | Freq: Two times a day (BID) | ORAL | Status: DC
  Administered 2019-06-27 – 2019-07-05 (×18): 600 mg via ORAL
  Filled 2019-06-26 (×18): qty 1

## 2019-06-26 MED ORDER — ACETAMINOPHEN 500 MG PO TABS
1000.0000 mg | ORAL_TABLET | Freq: Once | ORAL | Status: AC
  Administered 2019-06-26: 1000 mg via ORAL
  Filled 2019-06-26: qty 2

## 2019-06-26 MED ORDER — ENOXAPARIN SODIUM 40 MG/0.4ML ~~LOC~~ SOLN: 40.0000 mg | SUBCUTANEOUS | Status: DC

## 2019-06-26 MED ORDER — SODIUM CHLORIDE 0.9 % IV BOLUS
1000.0000 mL | Freq: Once | INTRAVENOUS | Status: AC
  Administered 2019-06-26: 1000 mL via INTRAVENOUS

## 2019-06-26 MED ORDER — POTASSIUM CHLORIDE 20 MEQ PO PACK
40.0000 meq | PACK | Freq: Once | ORAL | Status: AC
  Administered 2019-06-27: 40 meq via ORAL
  Filled 2019-06-26: qty 2

## 2019-06-26 MED ORDER — PREDNISONE 20 MG PO TABS
40.0000 mg | ORAL_TABLET | Freq: Every day | ORAL | Status: DC
  Administered 2019-06-28: 08:00:00 40 mg via ORAL
  Filled 2019-06-26: qty 2

## 2019-06-26 MED ORDER — SODIUM CHLORIDE 0.9 % IV SOLN
500.0000 mg | INTRAVENOUS | Status: DC
  Filled 2019-06-26: qty 500

## 2019-06-26 MED ORDER — SODIUM CHLORIDE 0.9 % IV BOLUS
500.0000 mL | Freq: Once | INTRAVENOUS | Status: AC
  Administered 2019-06-26: 22:00:00 500 mL via INTRAVENOUS

## 2019-06-26 MED ORDER — SODIUM CHLORIDE 0.9 % IV SOLN
2.0000 g | Freq: Once | INTRAVENOUS | Status: AC
  Administered 2019-06-26: 2 g via INTRAVENOUS

## 2019-06-26 MED ORDER — VANCOMYCIN HCL IN DEXTROSE 1-5 GM/200ML-% IV SOLN: 1000.0000 mg | Freq: Once | INTRAVENOUS | Status: DC

## 2019-06-26 MED ORDER — SODIUM CHLORIDE 0.9 % IV SOLN
500.0000 mg | Freq: Once | INTRAVENOUS | Status: AC
  Administered 2019-06-26: 500 mg via INTRAVENOUS
  Filled 2019-06-26: qty 500

## 2019-06-26 MED ORDER — LACTATED RINGERS IV BOLUS
1000.0000 mL | Freq: Once | INTRAVENOUS | Status: AC
  Administered 2019-06-26: 1000 mL via INTRAVENOUS

## 2019-06-26 MED ORDER — IPRATROPIUM-ALBUTEROL 0.5-2.5 (3) MG/3ML IN SOLN: 3.0000 mL | Freq: Four times a day (QID) | RESPIRATORY_TRACT | Status: DC | PRN

## 2019-06-26 MED ORDER — SODIUM CHLORIDE 0.9 % IV SOLN
2.0000 g | INTRAVENOUS | Status: DC
  Administered 2019-06-27 – 2019-06-28 (×2): 2 g via INTRAVENOUS
  Filled 2019-06-26 (×2): qty 2

## 2019-06-26 NOTE — ED Triage Notes (Signed)
Patient comes to ED via EMS due to Shortness of breath.  With EMS patient had poor breath sounds and received 2 DUONEB tX and 125mg  solu-medrol.f Patient had HR of 140s-190s and is in AFIB. On arrival patient ambulated from stretcher to bed and denies pain. Crackles heard without stethoscope. Patient A&O x4

## 2019-06-26 NOTE — ED Notes (Signed)
1st set of blood cultures collected by Butch, RN from left hand. 

## 2019-06-26 NOTE — ED Notes (Signed)
Patient placed on 2 LPM via [place] due to psO2 at 93% on RA

## 2019-06-26 NOTE — ED Notes (Signed)
2nd set of blood cultures collected by Butch, RN from right hand. 

## 2019-06-26 NOTE — Progress Notes (Deleted)
Pharmacy Antibiotic Note  Demarlo Bhola. is a 74 y.o. male admitted on 06/26/2019 with pneumonia.  Pharmacy has been consulted for cefepime dosing. Patient received cefepime 2g IV x 1 in ED.  Plan: Will continue cefepime 2g IV q12h per CrCl 30 - 60 ml/min and will continue to monitor.  Height: 6' (182.9 cm) Weight: 207 lb (93.9 kg) IBW/kg (Calculated) : 77.6  Temp (24hrs), Avg:100 F (37.8 C), Min:100 F (37.8 C), Max:100 F (37.8 C)  Recent Labs  Lab 06/26/19 2145 06/26/19 2224  WBC 4.0  --   CREATININE  --  1.44*  LATICACIDVEN  --  2.6*    Estimated Creatinine Clearance: 55.2 mL/min (A) (by C-G formula based on SCr of 1.44 mg/dL (H)).    Allergies  Allergen Reactions  . Bydureon [Exenatide] Anaphylaxis    Possible from bydureon, not 100% confirmed  . Clindamycin/Lincomycin Hives and Itching  . Penicillins Hives, Itching, Rash and Other (See Comments)    Has patient had a PCN reaction causing immediate rash, facial/tongue/throat swelling, SOB or lightheadedness with hypotension: Yes Has patient had a PCN reaction causing severe rash involving mucus membranes or skin necrosis: No Has patient had a PCN reaction that required hospitalization: No Has patient had a PCN reaction occurring within the last 10 years: No If all of the above answers are "NO", then may proceed with Cephalosporin use.  . Sulfa Antibiotics Shortness Of Breath  . Spiriferis Other (See Comments)    Affected breathing  . Spiriva Handihaler [Tiotropium Bromide Monohydrate]     Affected breathing  . Ivp Dye [Iodinated Diagnostic Agents] Rash and Other (See Comments)    Redness    Thank you for allowing pharmacy to be a part of this patient's care.  Tobie Lords, PharmD, BCPS Clinical Pharmacist 06/26/2019 11:12 PM

## 2019-06-26 NOTE — ED Notes (Signed)
LAB reports critical lab value. Lactic is 2.6

## 2019-06-26 NOTE — Progress Notes (Signed)
PHARMACY -  BRIEF ANTIBIOTIC NOTE   Pharmacy has received consult(s) for vancomycin from an ED provider.  The patient's profile has been reviewed for ht/wt/allergies/indication/available labs.    One time order(s) placed for vancomycin 2g IV load  Further antibiotics/pharmacy consults should be ordered by admitting physician if indicated.                       Thank you,  Tobie Lords, PharmD, BCPS Clinical Pharmacist 06/26/2019  11:57 PM

## 2019-06-26 NOTE — ED Provider Notes (Signed)
[facility] Emergency Department Provider Note  ____________________________________________   First MD Initiated Contact with Patient 06/26/19 2138     (approximate)  I have reviewed the triage vital signs and the nursing notes.   HISTORY  Chief Complaint Shortness of Breath    HPI Philip Shenker. is a 74 y.o. male with COPD, A. fib on Eliquis who comes in for shortness of breath.  Shortness of breath started today, severe, constant, with slightly improved with his inhaler but then worse again, worse with exertion.  Denies any chest pain.  Stated that he started to have chills and a fever.  Patient was febrile with EMS.  Patient was given Solu-Medrol and 2 DuoNeb's.  Patient was A. fib with RVR.  Patient been compliant with all of his medications.  Denies any abdominal pain.          Past Medical History:  Diagnosis Date  . Arthritis    a. right knee  . Asthma   . Atrial tachycardia ([place])   . 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 etiology.  [name] COPD (chronic obstructive pulmonary disease) ([place])   . Coronary artery disease    a. 05/2012 Cath: severe 3VD-->CABG x 4 by Dr. Servando Snare in 1/2014Scot [name], SVG->LCX & SVG->RPDA; b. 06/2014 MV: no ischemia/infarct.  . Diastolic dysfunction    a. 06/2015 Echo: EF 50-55%, Gr1 DD. Mild MR. Mildly dil LA. Nl RV fxn. Nl PASP.  [name] Edema    LEGS/ FEET  . Essential hypertension   . History of bronchitis   . HOH (hard of hearing)   . Hypercholesteremia   . Neuropathy   . PAF (paroxysmal atrial fibrillation) (HCC)    a. CHA2DS2VASc = 4-->Eliquis.  . Shingles   . Sleep apnea    NO CPAP  . Type II diabetes mellitus [facility])     Patient Active Problem List   Diagnosis Date Noted  . Overweight (BMI 25.0-29.9) 07/01/2018  . Osteoarthritis of multiple joints 05/14/2017  . Chronic pain of left knee 05/14/2017  . OSA (obstructive sleep apnea) 02/28/2017  . CKD (chronic kidney disease), stage II 02/28/2017  .  Chronic anticoagulation 02/28/2017  . Onychomycosis of left great toe 08/14/2016  . Allergic rhinitis due to allergen 07/31/2016  . Peripheral neuropathy 08/17/2015  . Type 2 diabetes mellitus with peripheral neuropathy ([place]) 08/17/2015  . (HFpEF) heart failure with preserved ejection fraction ([place]) 07/25/2015  . Atrial fibrillation ([place]) 12/10/2014  . Centrilobular emphysema ([place]) 11/09/2014  . Bradycardia 11/09/2014  . 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 Benign hypertension with CKD (chronic kidney disease), stage II 11/09/2014  . Bursitis of hip 11/09/2014  . Enlarged prostate 11/09/2014  . Coronary artery disease   . 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 Hypothyroid 05/06/2012  . Hyperlipidemia associated with type 2 diabetes mellitus ([place]) 03/31/2012  . Colon polyp 12/21/2003    Past Surgical History:  Procedure Laterality Date  . ANTERIOR CERVICAL DECOMP/DISCECTOMY FUSION  ~ 2009  . BACK SURGERY     CERVICAL FUSION  . CARDIAC CATHETERIZATION  05/06/2012   Significant three-vessel coronary artery disease with normal ejection fraction  . CATARACT EXTRACTION W/PHACO Right 08/15/2017   Procedure: CATARACT EXTRACTION PHACO AND INTRAOCULAR LENS PLACEMENT (IOC);  Surgeon: Birder Robson, MD;  Location: [facility] ORS;  Service: Ophthalmology;  Laterality: Right;  [place] 00:23 AP% 13.3 CDE 3.11 Fluid pack lot # [id number]  . CATARACT EXTRACTION W/PHACO Left 10/03/2017  Procedure: CATARACT EXTRACTION PHACO AND INTRAOCULAR LENS PLACEMENT (IOC);  Surgeon: Birder Robson, MD;  Location: [facility] ORS;  Service: Ophthalmology;  Laterality: Left;  Lot #[id number] H [place]: 00:21.2 AP%:13.9 CDE: 2.96  . CORONARY ANGIOPLASTY    . CORONARY ARTERY BYPASS GRAFT  05/07/2012   Procedure: CORONARY ARTERY BYPASS GRAFTING (CABG);  Surgeon: [name], MD;  Location: [place];  Service: Open Heart Surgery;  Laterality: N/A;  times three  . INTRAOPERATIVE TRANSESOPHAGEAL ECHOCARDIOGRAM  05/07/2012   Procedure:  INTRAOPERATIVE TRANSESOPHAGEAL ECHOCARDIOGRAM;  Surgeon: [name], MD;  Location: [place];  Service: Open Heart Surgery;  Laterality: N/A;  . KNEE ARTHROSCOPY  ~ 2000   "right" (05/06/2012)  . KNEE SURGERY  2003   right  . NECK SURGERY  2008   Plate in neck    Prior to Admission medications   Medication Sig Start Date End Date Taking? Authorizing Provider  albuterol (PROVENTIL) (2.5 MG/3ML) 0.083% nebulizer solution Inhale 3 mLs into the lungs every 6 (six) hours as needed for wheezing or shortness of breath.  03/13/16   [provider]  Albuterol Sulfate 108 (90 Base) MCG/ACT AEPB Inhale 2 puffs into the lungs every 6 (six) hours as needed (shortness of breath).     [provider]  Artificial Tear Ointment (DRY EYES OP) Place 1 drop into both eyes daily as needed (for dry eyes).    [provider]  atorvastatin (LIPITOR) 40 MG tablet Take 1 tablet by mouth once daily 09/30/18   [name], MD  Blood Glucose Monitoring Suppl (ONE TOUCH ULTRA 2) w/Device KIT Use to check blood sugar as advised up to twice a day 03/10/19   [name], DO  cetirizine (ZYRTEC) 10 MG tablet Take 10 mg by mouth daily.     [provider]  diclofenac sodium (VOLTAREN) 1 % GEL Apply 2 g topically 3 (three) times daily as needed (Use on elbow bursitis and knee). 07/04/17   [name], [name], DO  ELIQUIS 5 MG TABS tablet Take 1 tablet by mouth twice daily 11/27/18   [name], MD  fluticasone (FLONASE) 50 MCG/ACT nasal spray Place 1 spray into both nostrils daily as needed for allergies or rhinitis.    [provider]  furosemide (LASIX) 40 MG tablet TAKE 1 TABLET BY MOUTH ONCE DAILY. APPOINTMENT NEEDED FOR FURTHER REFILLS. Patient taking differently: Every other day 12/31/18   Rise Mu, [name]  gabapentin (NEURONTIN) 100 MG capsule TAKE 1 CAPSULE BY MOUTH THREE TIMES DAILY 05/22/19   [name], [name], DO  guaiFENesin (MUCINEX) 600  MG 12 hr tablet Take 600 mg by mouth 2 (two) times daily as needed.    [provider]  LANCETS ULTRA THIN MISC 1 Device by Does not apply route 4 (four) times daily -  before meals and at bedtime. 05/17/12   [name], MD  levothyroxine (SYNTHROID) 88 MCG tablet Take 1 tablet by mouth once daily 03/16/19   [name], [name], DO  losartan (COZAAR) 25 MG tablet Take 1 tablet by mouth once daily 03/31/19   [name], MD  metFORMIN (GLUCOPHAGE) 500 MG tablet TAKE 2 TABLETS BY MOUTH TWICE DAILY WITH A MEAL 05/12/19   [name], [name], DO  metoprolol tartrate (LOPRESSOR) 25 MG tablet TAKE 1 TABLET BY MOUTH TWICE DAILY . APPOINTMENT REQUIRED FOR FUTURE REFILLS 05/22/19   [name], MD  montelukast (SINGULAIR) 10 MG tablet Take 10 mg by mouth at bedtime.  02/08/15   [provider]  ONETOUCH ULTRA test strip USE TEST STRIP(S) TO CHECK GLUCOSE TWICE DAILY 12/10/18   [name], [name], DO  predniSONE (DELTASONE) 5 MG tablet Take 5 mg by mouth every other day.  09/04/17   [provider]  SYMBICORT 160-4.5 MCG/ACT inhaler Inhale 2 puffs 2 (two) times daily into the lungs.  08/11/16   [provider]    Allergies Bydureon [exenatide], Clindamycin/lincomycin, Penicillins, Sulfa antibiotics, Spiriferis, Spiriva handihaler [tiotropium bromide monohydrate], and Ivp dye [iodinated diagnostic agents]  Family History  Problem Relation Age of Onset  . Heart disease Mother   . Clotting disorder Mother   . Hypertension Sister     Social History Social History   Tobacco Use  . Smoking status: Former Smoker    Packs/day: 1.00    Years: 8.00    Pack years: 8.00    Types: Cigarettes    Quit date: 12/31/1967    Years since quitting: 51.5  . Smokeless tobacco: Former Network engineer Use Topics  . Alcohol use: No    Alcohol/week: 0.0 standard drinks    Comment: 05/06/2012 "last alcohol several years ago; never had problem wit"  . Drug  use: No      Review of Systems Constitutional: Positive fevers Eyes: No visual changes. ENT: No sore throat. Cardiovascular: No chest pain Respiratory: Positive for SOB Gastrointestinal: No abdominal pain.  No nausea, no vomiting.  No diarrhea.  No constipation. Genitourinary: Negative for dysuria. Musculoskeletal: Negative for back pain. Skin: Negative for rash. Neurological: Negative for headaches, focal weakness or numbness. All other ROS negative ____________________________________________   PHYSICAL EXAM:  VITAL SIGNS: ED Triage Vitals [06/26/19 2140]  Enc Vitals Group     BP 114/66     Pulse Rate (!) 139     Resp (!) 24     Temp 100 F (37.8 C)     Temp Source Oral     SpO2 94 %     Weight 207 lb (93.9 kg)     Height 6' (1.829 m)     Head Circumference      Peak Flow      Pain Score 0     Pain Loc      Pain Edu?      Excl. in [place]?     Constitutional: Alert and oriented. Well appearing and in no acute distress. Eyes: Conjunctivae are normal. EOMI. Head: Atraumatic. Nose: No congestion/rhinnorhea. Mouth/Throat: Mucous membranes are moist.   Neck: No stridor. Trachea Midline. FROM Cardiovascular: Normal rate, regular rhythm. Grossly normal heart sounds.  Good peripheral circulation. Respiratory: Coarse breath sounds bilaterally, satting 90% on room air.  No stridor.  Placed on 2 L Gastrointestinal: Soft and nontender. No distention. No abdominal bruits.  Musculoskeletal: No lower extremity tenderness nor edema.  No joint effusions. Neurologic:  Normal speech and language. No gross focal neurologic deficits are appreciated.  Skin:  Skin is warm, dry and intact. No rash noted. Psychiatric: Mood and affect are normal. Speech and behavior are normal. GU: Deferred   ____________________________________________   LABS (all labs ordered are listed, but only abnormal results are displayed)  Labs Reviewed  PROTIME-INR - Abnormal; Notable for the following  components:      Result Value   Prothrombin Time 18.6 (*)    INR 1.6 (*)    All other components within normal limits  BRAIN NATRIURETIC PEPTIDE - Abnormal; Notable for the following components:   B Natriuretic Peptide 583.0 (*)  All other components within normal limits  COMPREHENSIVE METABOLIC PANEL - Abnormal; Notable for the following components:   Potassium 3.2 (*)    CO2 21 (*)    Glucose, Bld 194 (*)    BUN 24 (*)    Creatinine, Ser 1.44 (*)    Calcium 7.9 (*)    Total Protein 4.9 (*)    Albumin 3.3 (*)    GFR calc non Af Amer 48 (*)    GFR calc Af Amer 56 (*)    All other components within normal limits  LACTIC ACID, PLASMA - Abnormal; Notable for the following components:   Lactic Acid, Venous 2.6 (*)    All other components within normal limits  CULTURE, BLOOD (ROUTINE X 2)  CULTURE, BLOOD (ROUTINE X 2)  URINE CULTURE  RESPIRATORY PANEL BY RT PCR (FLU A&B, COVID)  CBC WITH DIFFERENTIAL/PLATELET  APTT  PROCALCITONIN  URINALYSIS, ROUTINE W REFLEX MICROSCOPIC  LACTIC ACID, PLASMA  LACTIC ACID, PLASMA  POC SARS CORONAVIRUS 2 AG -  ED  POC SARS CORONAVIRUS 2 AG  TROPONIN I (HIGH SENSITIVITY)  TROPONIN I (HIGH SENSITIVITY)   ____________________________________________   ED ECG REPORT I, Vanessa [place], the attending physician, personally viewed and interpreted this ECG.  EKG is irregular A. fib rate of 118, no ST elevation, no T wave inversions, normal intervals ____________________________________________  RADIOLOGY Robert Bellow, personally viewed and evaluated these images (plain radiographs) as part of my medical decision making, as well as reviewing the written report by the radiologist.  ED MD interpretation: Concern for a left lower lobe pneumonia  Official radiology report(s): DG Chest Port 1 View  Result Date: 06/26/2019 CLINICAL DATA:  Short of breath, tachycardia EXAM: PORTABLE CHEST 1 VIEW COMPARISON:  05/08/2016 FINDINGS: Single frontal view  of the chest demonstrates mild enlargement the cardiac silhouette. 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 There is mild central vascular congestion. Left lower lobe consolidation is noted. No effusion or pneumothorax. IMPRESSION: 1. Left lower lobe consolidation which may reflect atelectasis or airspace disease. 2. Mild central vascular congestion. Electronically Signed   By: Randa Ngo M.D.   On: 06/26/2019 22:02    ____________________________________________   PROCEDURES  Procedure(s) performed (including Critical Care):  .Critical Care Performed by: Vanessa [place], MD Authorized by: Vanessa [place], MD   Critical care provider statement:    Critical care time (minutes):  45   Critical care was necessary to treat or prevent imminent or life-threatening deterioration of the following conditions:  Sepsis   Critical care was time spent personally by me on the following activities:  Discussions with consultants, evaluation of patient's response to treatment, examination of patient, ordering and performing treatments and interventions, ordering and review of laboratory studies, ordering and review of radiographic studies, pulse oximetry, re-evaluation of patient's condition, obtaining history from patient or surrogate and review of old charts     ____________________________________________   INITIAL IMPRESSION / ASSESSMENT AND PLAN / ED COURSE   Philip PILZ Sr. was evaluated in Emergency Department on 06/26/2019 for the symptoms described in the history of present illness. He was evaluated in the context of the global COVID-19 pandemic, which necessitated consideration that the patient might be at risk for infection with the SARS-CoV-2 virus that causes COVID-19. Institutional protocols and algorithms that pertain to the evaluation of patients at risk for COVID-19 are in a state of rapid change based on information released by regulatory bodies including the CDC and federal and state  organizations. These policies and algorithms were followed during the patient's care in the ED.     Pt presents with SOB. Differential includes: PNA-will get xray to evaluation Anemia-CBC to evaluate ACS- will get trops Arrhythmia-Will get EKG and keep on monitor.  COVID- will get testing per algorithm. PE-lower suspicion given no risk factors and other cause more likely and patient is on a blood thinner  Patient is in A. fib with RVR but I suspect this is from the albuterol from EMS versus an infection.  We will treat his fever with Tylenol and give some fluids and see if that helps rate control him.  With these treatments his heart rate has come down to the low 100s although he is slightly hypotensive.  Patient is mentating very well.  Patient given another liter of fluid.  Patient has warm extremities I do not think this is cardiogenic shock.  His BNP is only slightly elevated.  We will continue to closely monitor his respiratory status due to his history of heart failure per patient.  However his last echo 3 years ago shows an EF of 50 to 55%.  Patient's 20 care Covid was negative.  We will do 2-hour test.  Creatinine is elevated consistent with my concern for some dehydration.  Procalcitonin significantly elevated consistent with my concern for infection.  Patient covered with broad-spectrum antibiotics.  Discussed with hospital team and will admit patient for sepsis, on 2 L of oxygen due to saturations of 92% on room air.   ____________________________________________   FINAL CLINICAL IMPRESSION(S) / ED DIAGNOSES   Final diagnoses:  Community acquired pneumonia of left lower lobe of lung  Chronic obstructive pulmonary disease, unspecified COPD type ([place])  Sepsis, due to unspecified organism, unspecified whether acute organ dysfunction present ([place])     MEDICATIONS GIVEN DURING THIS VISIT:  Medications  azithromycin (ZITHROMAX) 500 mg in sodium chloride 0.9 % 250 mL IVPB (500  mg Intravenous New Bag/Given 06/26/19 2326)  ceFEPIme (MAXIPIME) 2 g in sodium chloride 0.9 % 100 mL IVPB (has no administration in time range)  lactated ringers bolus 1,000 mL (has no administration in time range)  enoxaparin (LOVENOX) injection 40 mg (has no administration in time range)  cefTRIAXone (ROCEPHIN) 2 g in sodium chloride 0.9 % 100 mL IVPB (has no administration in time range)  azithromycin (ZITHROMAX) 500 mg in sodium chloride 0.9 % 250 mL IVPB (has no administration in time range)  methylPREDNISolone sodium succinate (SOLU-MEDROL) 125 mg/2 mL injection 60 mg (has no administration in time range)    Followed by  predniSONE (DELTASONE) tablet 40 mg (has no administration in time range)  ipratropium-albuterol (DUONEB) 0.5-2.5 (3) MG/3ML nebulizer solution 3 mL (has no administration in time range)  guaiFENesin (MUCINEX) 12 hr tablet 600 mg (has no administration in time range)  potassium chloride (KLOR-CON) packet 40 mEq (has no administration in time range)  vancomycin (VANCOCIN) IVPB 1000 mg/200 mL premix (has no administration in time range)  acetaminophen (TYLENOL) tablet 1,000 mg (1,000 mg Oral Given 06/26/19 2154)  sodium chloride 0.9 % bolus 500 mL (0 mLs Intravenous Stopped 06/26/19 2205)  ceFEPIme (MAXIPIME) 2 g in sodium chloride 0.9 % 100 mL IVPB (0 g Intravenous Stopped 06/26/19 2319)  sodium chloride 0.9 % bolus 1,000 mL (0 mLs Intravenous Stopped 06/26/19 2331)     ED Discharge Orders    None       Note:  This document was prepared using Dragon voice recognition software and may include  unintentional dictation errors.   Vanessa [place], MD 06/26/19 [phone]

## 2019-06-26 NOTE — Patient Outreach (Signed)
[facility]) Care Management  06/26/2019  ORVIS RUDELL Sr. 1946-04-04 [id number]    Received both patient and provider portion(s) of patient assistance application(s) for Januvia. Mailed completed application and required documents into [employer].  Will follow up with company(ies) in 15-20 business days to check status of application(s).  Marylouise Mallet P. Joas Motton, [place] Management [phone]

## 2019-06-26 NOTE — H&P (Signed)
[name] NAME: Philip [name]    MR#:  [id number]  DATE OF BIRTH:  [birth year]  DATE OF ADMISSION:  06/26/2019  PRIMARY CARE PHYSICIAN: [name], DO   REQUESTING/REFERRING PHYSICIAN: Marjean Donna, MD  CHIEF COMPLAINT:   Chief Complaint  Patient presents with  . Shortness of Breath    HISTORY OF PRESENT ILLNESS:  Philip [name]  is a 74 y.o. male with a known history of COPD, asthma, coronary artery disease status post four-vessel CABG, hypertension, type 2 diabetes mellitus and dyslipidemia, who presented to the emergency room with acute onset of worsening dyspnea with associated wheezing and cough productive of yellowish sputum with occasional blood-tinged that was more frequent in the emergency room, that started earlier today.  He admitted to fever and chills and had nausea and vomiting once.  He denied any bilious vomitus or hematemesis or other bleeding diathesis.  No abdominal pain or diarrhea or constipation.  He denied any headache or dizziness or blurred vision.  He has been having chest pain only with cough that he described as nagging.  No dysuria, oliguria or hematuria or flank pain.  Upon presentation to the emergency room, heart rate was 139 the patient was in atrial fibrillation with rapid ventricular response and temperature was 100 with blood pressure 114/66 and respiratory to 24 with pulse currently 94% on room air.  Later on blood pressure was down to 89/62 heart rate was 113 then 127.  Pulse oximetry was down to 93% and went up to 96% on 2 L of O2 by nasal cannula.  The patient was hydrated with 1.5 l of IV lactated Ringer blood pressure came down to 93/52 then during my interview was 83/50 with a heart rate 108 and at that time pulse currently was 96%.  Manual blood pressure was checked and came back 72/50.  Labs revealed mild hypokalemia 3.2 with a blood glucose of 194, BUN of 24 and creatinine 1.44 compared with 17 and  1.11 on 04/17/2019.  BNP was 583 and albumin was 3.3.  High-sensitivity troponin I was 8, lactic acid was 2.6 and procalcitonin 7.19.  CBC is unremarkable, INR is 1.6 with PT of 18.6.  Influenza antigens came back negative SARS antigen came back negative and COVID-19 PCR was negative as well.  Portable chest x-ray showed left lower lobe consolidation that may reflect atelectasis or airspace disease and mild central vascular congestion. EKG showed atrial fibrillation with rapid ventricular sponsor 118 with poor R wave progression.  The patient was given IV cefepime and Zithromax as well as 1 g p.o. Tylenol in addition to 1 and half liter of IV lactated Ringer bolus.  He will be started on IV Levophed and admitted to an ICU bed. PAST MEDICAL HISTORY:   Past Medical History:  Diagnosis Date  . Arthritis    a. right knee  . Asthma   . Atrial tachycardia ([place])   . 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 etiology.  [name] COPD (chronic obstructive pulmonary disease) ([place])   . Coronary artery disease    a. 05/2012 Cath: severe 3VD-->CABG x 4 by Dr. Servando Snare in 1/2014[place], SVG->LCX & SVG->RPDA; b. 06/2014 MV: no ischemia/infarct.  . Diastolic dysfunction    a. 06/2015 Echo: EF 50-55%, Gr1 DD. Mild MR. Mildly dil LA. Nl RV fxn. Nl PASP.  [name] Edema    LEGS/ FEET  . Essential hypertension   . History of  bronchitis   . HOH (hard of hearing)   . Hypercholesteremia   . Neuropathy   . PAF (paroxysmal atrial fibrillation) ([facility])    a. CHA2DS2VASc = 4-->Eliquis.  . Shingles   . Sleep apnea    NO CPAP  . Type II diabetes mellitus ([place])     PAST SURGICAL HISTORY:   Past Surgical History:  Procedure Laterality Date  . ANTERIOR CERVICAL DECOMP/DISCECTOMY FUSION  ~ 2009  . BACK SURGERY     CERVICAL FUSION  . CARDIAC CATHETERIZATION  05/06/2012   Significant three-vessel coronary artery disease with normal ejection fraction  . CATARACT EXTRACTION W/PHACO Right 08/15/2017   Procedure: CATARACT EXTRACTION  PHACO AND INTRAOCULAR LENS PLACEMENT (IOC);  Surgeon: Birder Robson, MD;  Location: [facility];  Service: Ophthalmology;  Laterality: Right;  [place] 00:23 AP% 13.3 CDE 3.11 Fluid pack lot # [id number]  . CATARACT EXTRACTION W/PHACO Left 10/03/2017   Procedure: CATARACT EXTRACTION PHACO AND INTRAOCULAR LENS PLACEMENT (IOC);  Surgeon: Birder Robson, MD;  Location: [facility];  Service: Ophthalmology;  Laterality: Left;  Lot #[id number] H [place]: 00:21.2 AP%:13.9 CDE: 2.96  . CORONARY ANGIOPLASTY    . CORONARY ARTERY BYPASS GRAFT  05/07/2012   Procedure: CORONARY ARTERY BYPASS GRAFTING (CABG);  Surgeon: [name], MD;  Location: [facility];  Service: Open Heart Surgery;  Laterality: N/A;  times three  . INTRAOPERATIVE TRANSESOPHAGEAL ECHOCARDIOGRAM  05/07/2012   Procedure: INTRAOPERATIVE TRANSESOPHAGEAL ECHOCARDIOGRAM;  Surgeon: [name], MD;  Location: [facility];  Service: Open Heart Surgery;  Laterality: N/A;  . KNEE ARTHROSCOPY  ~ 2000   "right" (05/06/2012)  . KNEE SURGERY  2003   right  . NECK SURGERY  2008   Plate in neck    SOCIAL HISTORY:   Social History   Tobacco Use  . Smoking status: Former Smoker    Packs/day: 1.00    Years: 8.00    Pack years: 8.00    Types: Cigarettes    Quit date: 12/31/1967    Years since quitting: 51.5  . Smokeless tobacco: Former Network engineer Use Topics  . Alcohol use: No    Alcohol/week: 0.0 standard drinks    Comment: 05/06/2012 "last alcohol several years ago; never had problem wit"    FAMILY HISTORY:   Family History  Problem Relation Age of Onset  . Heart disease Mother   . Clotting disorder Mother   . Hypertension Sister     DRUG ALLERGIES:   Allergies  Allergen Reactions  . Bydureon [Exenatide] Anaphylaxis    Possible from bydureon, not 100% confirmed  . Clindamycin/Lincomycin Hives and Itching  . Penicillins Hives, Itching, Rash and Other (See Comments)    Has patient had a PCN reaction causing immediate rash,  facial/tongue/throat swelling, SOB or lightheadedness with hypotension: Yes Has patient had a PCN reaction causing severe rash involving mucus membranes or skin necrosis: No Has patient had a PCN reaction that required hospitalization: No Has patient had a PCN reaction occurring within the last 10 years: No If all of the above answers are "NO", then may proceed with Cephalosporin use.  . Sulfa Antibiotics Shortness Of Breath  . Spiriferis Other (See Comments)    Affected breathing  . Spiriva Handihaler [Tiotropium Bromide Monohydrate]     Affected breathing  . Ivp Dye [Iodinated Diagnostic Agents] Rash and Other (See Comments)    Redness    REVIEW OF SYSTEMS:   ROS As per history of present illness. All pertinent systems were reviewed above. Constitutional,  HEENT, cardiovascular, respiratory, GI, GU, musculoskeletal, neuro, psychiatric, endocrine,  integumentary and hematologic systems were reviewed and are otherwise  negative/unremarkable except for positive findings mentioned above in the HPI.   MEDICATIONS AT HOME:   Prior to Admission medications   Medication Sig Start Date End Date Taking? Authorizing Provider  albuterol (PROVENTIL) (2.5 MG/3ML) 0.083% nebulizer solution Inhale 3 mLs into the lungs every 6 (six) hours as needed for wheezing or shortness of breath.  03/13/16  Yes [provider]  Albuterol Sulfate 108 (90 Base) MCG/ACT AEPB Inhale 2 puffs into the lungs every 6 (six) hours as needed (shortness of breath).    Yes [provider]  Artificial Tear Ointment (DRY EYES OP) Place 1 drop into both eyes daily as needed (for dry eyes).   Yes [provider]  atorvastatin (LIPITOR) 40 MG tablet Take 1 tablet by mouth once daily 09/30/18  Yes [name], MD  cetirizine (ZYRTEC) 10 MG tablet Take 10 mg by mouth daily.    Yes [provider]  diclofenac sodium (VOLTAREN) 1 % GEL Apply 2 g topically 3 (three) times daily as needed (Use  on elbow bursitis and knee). 07/04/17  Yes [name], [name], DO  ELIQUIS 5 MG TABS tablet Take 1 tablet by mouth twice daily 11/27/18  Yes [name], MD  fluticasone (FLONASE) 50 MCG/ACT nasal spray Place 1 spray into both nostrils daily as needed for allergies or rhinitis.   Yes [provider]  furosemide (LASIX) 40 MG tablet TAKE 1 TABLET BY MOUTH ONCE DAILY. APPOINTMENT NEEDED FOR FURTHER REFILLS. Patient taking differently: Every other day 12/31/18  Yes Dunn, Areta Haber, [name]  gabapentin (NEURONTIN) 100 MG capsule TAKE 1 CAPSULE BY MOUTH THREE TIMES DAILY 05/22/19  Yes [name], [name], DO  ipratropium-albuterol (DUONEB) 0.5-2.5 (3) MG/3ML SOLN Take 3 mLs by nebulization 4 (four) times daily. 02/12/19  Yes [provider]  levothyroxine (SYNTHROID) 88 MCG tablet Take 1 tablet by mouth once daily 03/16/19  Yes [name], [name], DO  losartan (COZAAR) 25 MG tablet Take 1 tablet by mouth once daily 03/31/19  Yes Arida, Mertie Clause, MD  metFORMIN (GLUCOPHAGE) 500 MG tablet TAKE 2 TABLETS BY MOUTH TWICE DAILY WITH A MEAL Patient taking differently: Take 1,000 mg by mouth 2 (two) times daily with a meal.  05/12/19  Yes [name], [name], DO  metoprolol tartrate (LOPRESSOR) 25 MG tablet TAKE 1 TABLET BY MOUTH TWICE DAILY . APPOINTMENT REQUIRED FOR FUTURE REFILLS Patient taking differently: Take 25 mg by mouth 2 (two) times daily.  05/22/19  Yes [name], MD  montelukast (SINGULAIR) 10 MG tablet Take 10 mg by mouth at bedtime.  02/08/15  Yes [provider]  predniSONE (DELTASONE) 5 MG tablet Take 5 mg by mouth every other day.  09/04/17  Yes [provider]  SYMBICORT 160-4.5 MCG/ACT inhaler Inhale 2 puffs 2 (two) times daily into the lungs.  08/11/16  Yes [provider]  Blood Glucose Monitoring Suppl (ONE TOUCH ULTRA 2) w/Device KIT Use to check blood sugar as advised up to twice a day 03/10/19   [name], [name], DO    guaiFENesin (MUCINEX) 600 MG 12 hr tablet Take 600 mg by mouth 2 (two) times daily as needed.    [provider]  LANCETS ULTRA THIN MISC 1 Device by Does not apply route 4 (four) times daily -  before meals and at bedtime. 05/17/12   [name], MD  ONETOUCH ULTRA test strip USE TEST STRIP(S) TO CHECK GLUCOSE TWICE DAILY 12/10/18   [name], [name], DO      VITAL SIGNS:  Blood pressure (!) 85/56, pulse (!) 108, temperature 99.4 F (37.4 C), temperature source Oral, resp. rate 19, height 6' (1.829 m), weight 93.9 kg, SpO2 95 %.  PHYSICAL EXAMINATION:  Physical Exam  GENERAL: Acutely ill 74 y.o.-year-old Caucasian male patient lying in the bed with mild to moderate respiratory distress with conversational dyspnea and intermittent cough and hemoptysis. EYES: Pupils equal, round, reactive to light and accommodation. No scleral icterus. Extraocular muscles intact.  HEENT: Head atraumatic, normocephalic. Oropharynx and nasopharynx clear.  NECK:  Supple, no jugular venous distention. No thyroid enlargement, no tenderness.  LUNGS: Diminished bibasal breath sounds with left basal crackles.  Diffuse expiratory wheezes and rhonchi with diminished expiratory airflow and harsh vesicular breathing. CARDIOVASCULAR: Regular rate and rhythm, S1, S2 normal. No murmurs, rubs, or gallops.  ABDOMEN: Soft, nondistended, nontender. Bowel sounds present. No organomegaly or mass.  EXTREMITIES: No pedal edema, cyanosis, or clubbing.  NEUROLOGIC: Cranial nerves II through XII are intact. Muscle strength 5/5 in all extremities. Sensation intact. Gait not checked.  PSYCHIATRIC: The patient is alert and oriented x 3.  Normal affect and good eye contact. SKIN: No obvious rash, lesion, or ulcer.   LABORATORY PANEL:   CBC Recent Labs  Lab 06/26/19 2145  WBC 4.0  HGB 13.9  HCT 40.4  PLT 162    ------------------------------------------------------------------------------------------------------------------  Chemistries  Recent Labs  Lab 06/26/19 2224  NA 140  K 3.2*  CL 110  CO2 21*  GLUCOSE 194*  BUN 24*  CREATININE 1.44*  CALCIUM 7.9*  AST 33  ALT 24  ALKPHOS 80  BILITOT 1.0   ------------------------------------------------------------------------------------------------------------------  Cardiac Enzymes No results for input(s): TROPONINI in the last 168 hours. ------------------------------------------------------------------------------------------------------------------  RADIOLOGY:  DG Chest Port 1 View  Result Date: 06/26/2019 CLINICAL DATA:  Short of breath, tachycardia EXAM: PORTABLE CHEST 1 VIEW COMPARISON:  05/08/2016 FINDINGS: Single frontal view of the chest demonstrates mild enlargement the cardiac silhouette. 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 There is mild central vascular congestion. Left lower lobe consolidation is noted. No effusion or pneumothorax. IMPRESSION: 1. Left lower lobe consolidation which may reflect atelectasis or airspace disease. 2. Mild central vascular congestion. Electronically Signed   By: Randa Ngo M.D.   On: 06/26/2019 22:02      IMPRESSION AND PLAN:   1.  Left lower lobe community-acquired pneumonia with subsequent sepsis and septic shock as well as hemoptysis. -The patient will be admitted to an ICU bed. -We will place him on IV Levophed and titrate for MAP more than 65. -We will continue antibiotic therapy with IV Rocephin and Zithromax. -Mucolytic therapy will be provided and DuoNebs 4 times daily and every 4 hours as needed. -Sputum Gram stain culture and sensitivity will be obtained. -Bacterial pneumonia antigens will be obtained.  COVID-19 PCR was negative -We will follow the elevated lactic acid and procalcitonin. -We will hold off his antihypertensives including Lopressor ARB. -Pulmonary consultation  will be obtained with intensivist consult especially given his hemoptysis that may require bronchoscopy.  I discussed the case with the ICU team. -  2.  COPD acute exacerbation. -This like secondary to #1. -The patient will be placed on IV steroid therapy with IV Solu-Medrol as well as duo nebs 4 times daily and every 4 hours as needed. -His Symbicort will be held off.  3.  Acute kidney injury. -  This is likely prerenal. -The patient was given hydration with IV lactated Ringer and his BMP will be followed.  4.  Atrial fibrillation with rapid ventricular  response. -We will utilize IV digoxin pending improvement of his blood pressure. -If he continues to be tachycardic, IV amiodarone could be utilized. -Eliquis may need to be held off given persistent hemoptysis.  5.  Elevated BNP. -This could be related to acute kidney injury but given vascular congestion on chest x-ray, the patient could be having acute cor pulmonale secondary to #1 and #2. -With improvement of his blood pressure with IV pressor therapy he may benefit from gentle diuresis.  5.  Hypokalemia. -Potassium will be replaced and magnesium level will be checked.  6.  Dyslipidemia. -Statin therapy will be resumed.  7.  DVT prophylaxis. -SCDs will be placed. -We we will hold off on Eliquis due to current hemoptysis.  8.  GI prophylaxis. -H2 blocker therapy with Pepcid will be provided especially given current steroid therapy.  Authorized and performed by: Eugenie Norrie, MD Total critical care time: Approximately :  60   minutes. Due to a high probability of clinically significant, life-threatening deterioration, the patient required my highest level of preparedness to intervene emergently and I personally spent this critical care time directly and personally managing the patient.  This critical care time included obtaining a history, examining the patient, pulse oximetry, ordering and review of studies, arranging urgent  treatment with development of management plan, evaluation of patient's response to treatment, frequent reassessment, and discussions with other providers. This critical care time was performed to assess and manage the high probability of imminent, life-threatening deterioration that could result in multiorgan failure.  It was exclusive of separately billable procedures and treating other patients and teaching time.  Please see MDM section and the rest of the note for further information on patient assessment and treatment.  All the records are reviewed and case discussed with ED provider. The plan of care was discussed in details with the patient (and family). I answered all questions. The patient agreed to proceed with the above mentioned plan. Further management will depend upon hospital course.   CODE STATUS: Full code    Christel Mormon M.D on 06/26/2019 at 11:42 PM  [facility]   From 7 PM-7 AM, contact night-coverage [email or web address]  CC: Primary care physician; [name], DO   Note: This dictation was prepared with Dragon dictation along with smaller phrase technology. Any transcriptional errors that result from this process are unintentional.

## 2019-06-27 DIAGNOSIS — R05 Cough: Secondary | ICD-10-CM | POA: Diagnosis not present

## 2019-06-27 DIAGNOSIS — A419 Sepsis, unspecified organism: Secondary | ICD-10-CM | POA: Diagnosis not present

## 2019-06-27 DIAGNOSIS — I130 Hypertensive heart and chronic kidney disease with heart failure and stage 1 through stage 4 chronic kidney disease, or unspecified chronic kidney disease: Secondary | ICD-10-CM | POA: Diagnosis present

## 2019-06-27 DIAGNOSIS — E7800 Pure hypercholesterolemia, unspecified: Secondary | ICD-10-CM | POA: Diagnosis present

## 2019-06-27 DIAGNOSIS — E039 Hypothyroidism, unspecified: Secondary | ICD-10-CM | POA: Diagnosis present

## 2019-06-27 DIAGNOSIS — J441 Chronic obstructive pulmonary disease with (acute) exacerbation: Secondary | ICD-10-CM | POA: Diagnosis not present

## 2019-06-27 DIAGNOSIS — I340 Nonrheumatic mitral (valve) insufficiency: Secondary | ICD-10-CM | POA: Diagnosis not present

## 2019-06-27 DIAGNOSIS — I4891 Unspecified atrial fibrillation: Secondary | ICD-10-CM | POA: Diagnosis not present

## 2019-06-27 DIAGNOSIS — R579 Shock, unspecified: Secondary | ICD-10-CM | POA: Diagnosis not present

## 2019-06-27 DIAGNOSIS — I5020 Unspecified systolic (congestive) heart failure: Secondary | ICD-10-CM | POA: Diagnosis not present

## 2019-06-27 DIAGNOSIS — B9561 Methicillin susceptible Staphylococcus aureus infection as the cause of diseases classified elsewhere: Secondary | ICD-10-CM | POA: Diagnosis not present

## 2019-06-27 DIAGNOSIS — I10 Essential (primary) hypertension: Secondary | ICD-10-CM | POA: Diagnosis not present

## 2019-06-27 DIAGNOSIS — G4733 Obstructive sleep apnea (adult) (pediatric): Secondary | ICD-10-CM | POA: Diagnosis present

## 2019-06-27 DIAGNOSIS — I2510 Atherosclerotic heart disease of native coronary artery without angina pectoris: Secondary | ICD-10-CM | POA: Diagnosis present

## 2019-06-27 DIAGNOSIS — H9190 Unspecified hearing loss, unspecified ear: Secondary | ICD-10-CM | POA: Diagnosis present

## 2019-06-27 DIAGNOSIS — J449 Chronic obstructive pulmonary disease, unspecified: Secondary | ICD-10-CM | POA: Diagnosis not present

## 2019-06-27 DIAGNOSIS — R778 Other specified abnormalities of plasma proteins: Secondary | ICD-10-CM | POA: Diagnosis not present

## 2019-06-27 DIAGNOSIS — R6521 Severe sepsis with septic shock: Secondary | ICD-10-CM | POA: Diagnosis present

## 2019-06-27 DIAGNOSIS — I5042 Chronic combined systolic (congestive) and diastolic (congestive) heart failure: Secondary | ICD-10-CM | POA: Diagnosis present

## 2019-06-27 DIAGNOSIS — I480 Paroxysmal atrial fibrillation: Secondary | ICD-10-CM | POA: Diagnosis not present

## 2019-06-27 DIAGNOSIS — Z20822 Contact with and (suspected) exposure to covid-19: Secondary | ICD-10-CM | POA: Diagnosis present

## 2019-06-27 DIAGNOSIS — R042 Hemoptysis: Secondary | ICD-10-CM | POA: Diagnosis not present

## 2019-06-27 DIAGNOSIS — R6520 Severe sepsis without septic shock: Secondary | ICD-10-CM | POA: Diagnosis not present

## 2019-06-27 DIAGNOSIS — Z452 Encounter for adjustment and management of vascular access device: Secondary | ICD-10-CM | POA: Diagnosis not present

## 2019-06-27 DIAGNOSIS — E872 Acidosis: Secondary | ICD-10-CM | POA: Diagnosis present

## 2019-06-27 DIAGNOSIS — E1142 Type 2 diabetes mellitus with diabetic polyneuropathy: Secondary | ICD-10-CM | POA: Diagnosis present

## 2019-06-27 DIAGNOSIS — I5022 Chronic systolic (congestive) heart failure: Secondary | ICD-10-CM | POA: Diagnosis not present

## 2019-06-27 DIAGNOSIS — B370 Candidal stomatitis: Secondary | ICD-10-CM | POA: Diagnosis not present

## 2019-06-27 DIAGNOSIS — R918 Other nonspecific abnormal finding of lung field: Secondary | ICD-10-CM | POA: Diagnosis not present

## 2019-06-27 DIAGNOSIS — R7881 Bacteremia: Secondary | ICD-10-CM | POA: Diagnosis not present

## 2019-06-27 DIAGNOSIS — I420 Dilated cardiomyopathy: Secondary | ICD-10-CM | POA: Diagnosis present

## 2019-06-27 DIAGNOSIS — J15211 Pneumonia due to Methicillin susceptible Staphylococcus aureus: Secondary | ICD-10-CM | POA: Diagnosis present

## 2019-06-27 DIAGNOSIS — N179 Acute kidney failure, unspecified: Secondary | ICD-10-CM | POA: Diagnosis not present

## 2019-06-27 DIAGNOSIS — A4101 Sepsis due to Methicillin susceptible Staphylococcus aureus: Secondary | ICD-10-CM | POA: Diagnosis not present

## 2019-06-27 DIAGNOSIS — I509 Heart failure, unspecified: Secondary | ICD-10-CM | POA: Diagnosis not present

## 2019-06-27 DIAGNOSIS — J3089 Other allergic rhinitis: Secondary | ICD-10-CM | POA: Diagnosis present

## 2019-06-27 DIAGNOSIS — J4540 Moderate persistent asthma, uncomplicated: Secondary | ICD-10-CM | POA: Diagnosis present

## 2019-06-27 DIAGNOSIS — J9601 Acute respiratory failure with hypoxia: Secondary | ICD-10-CM | POA: Diagnosis not present

## 2019-06-27 DIAGNOSIS — J440 Chronic obstructive pulmonary disease with acute lower respiratory infection: Secondary | ICD-10-CM | POA: Diagnosis present

## 2019-06-27 DIAGNOSIS — I361 Nonrheumatic tricuspid (valve) insufficiency: Secondary | ICD-10-CM | POA: Diagnosis not present

## 2019-06-27 DIAGNOSIS — J189 Pneumonia, unspecified organism: Secondary | ICD-10-CM | POA: Diagnosis not present

## 2019-06-27 DIAGNOSIS — R0602 Shortness of breath: Secondary | ICD-10-CM | POA: Diagnosis not present

## 2019-06-27 DIAGNOSIS — I4819 Other persistent atrial fibrillation: Secondary | ICD-10-CM | POA: Diagnosis present

## 2019-06-27 DIAGNOSIS — N182 Chronic kidney disease, stage 2 (mild): Secondary | ICD-10-CM | POA: Diagnosis present

## 2019-06-27 LAB — GROUP A STREP BY PCR: Group A Strep by PCR: NOT DETECTED

## 2019-06-27 LAB — URINALYSIS, ROUTINE W REFLEX MICROSCOPIC
Bacteria, UA: NONE SEEN
Bilirubin Urine: NEGATIVE
Glucose, UA: NEGATIVE mg/dL
Hgb urine dipstick: NEGATIVE
Ketones, ur: NEGATIVE mg/dL
Nitrite: NEGATIVE
Protein, ur: NEGATIVE mg/dL
Specific Gravity, Urine: 1.024 (ref 1.005–1.030)
Squamous Epithelial / HPF: NONE SEEN (ref 0–5)
pH: 5 (ref 5.0–8.0)

## 2019-06-27 LAB — PROCALCITONIN
Procalcitonin: 22.84 ng/mL
Procalcitonin: 26.84 ng/mL

## 2019-06-27 LAB — MRSA PCR SCREENING: MRSA by PCR: NEGATIVE

## 2019-06-27 LAB — GLUCOSE, CAPILLARY
Glucose-Capillary: 195 mg/dL — ABNORMAL HIGH (ref 70–99)
Glucose-Capillary: 257 mg/dL — ABNORMAL HIGH (ref 70–99)
Glucose-Capillary: 257 mg/dL — ABNORMAL HIGH (ref 70–99)
Glucose-Capillary: 327 mg/dL — ABNORMAL HIGH (ref 70–99)
Glucose-Capillary: 361 mg/dL — ABNORMAL HIGH (ref 70–99)
Glucose-Capillary: 332 mg/dL — ABNORMAL HIGH (ref 70–99)

## 2019-06-27 LAB — CBC WITH DIFFERENTIAL/PLATELET
Abs Immature Granulocytes: 0.24 10*3/uL — ABNORMAL HIGH (ref 0.00–0.07)
Basophils Absolute: 0 10*3/uL (ref 0.0–0.1)
Basophils Relative: 0 %
Eosinophils Absolute: 0.1 10*3/uL (ref 0.0–0.5)
Eosinophils Relative: 1 %
HCT: 39.6 % (ref 39.0–52.0)
Hemoglobin: 13.5 g/dL (ref 13.0–17.0)
Immature Granulocytes: 1 %
Lymphocytes Relative: 3 %
Lymphs Abs: 0.6 10*3/uL — ABNORMAL LOW (ref 0.7–4.0)
MCH: 31 pg (ref 26.0–34.0)
MCHC: 34.1 g/dL (ref 30.0–36.0)
MCV: 91 fL (ref 80.0–100.0)
Monocytes Absolute: 1.6 10*3/uL — ABNORMAL HIGH (ref 0.1–1.0)
Monocytes Relative: 9 %
Neutro Abs: 14.9 10*3/uL — ABNORMAL HIGH (ref 1.7–7.7)
Neutrophils Relative %: 86 %
Platelets: 216 10*3/uL (ref 150–400)
RBC: 4.35 MIL/uL (ref 4.22–5.81)
RDW: 13.2 % (ref 11.5–15.5)
Smear Review: NORMAL
WBC: 17.4 10*3/uL — ABNORMAL HIGH (ref 4.0–10.5)
nRBC: 0 % (ref 0.0–0.2)

## 2019-06-27 LAB — HEMOGLOBIN A1C
Hgb A1c MFr Bld: 7.9 % — ABNORMAL HIGH (ref 4.8–5.6)
Mean Plasma Glucose: 180.03 mg/dL

## 2019-06-27 LAB — BASIC METABOLIC PANEL
Anion gap: 11 (ref 5–15)
BUN: 26 mg/dL — ABNORMAL HIGH (ref 8–23)
CO2: 19 mmol/L — ABNORMAL LOW (ref 22–32)
Calcium: 7.8 mg/dL — ABNORMAL LOW (ref 8.9–10.3)
Chloride: 111 mmol/L (ref 98–111)
Creatinine, Ser: 1.52 mg/dL — ABNORMAL HIGH (ref 0.61–1.24)
GFR calc Af Amer: 52 mL/min — ABNORMAL LOW (ref >60)
GFR calc non Af Amer: 45 mL/min — ABNORMAL LOW (ref >60)
Glucose, Bld: 255 mg/dL — ABNORMAL HIGH (ref 70–99)
Potassium: 4.5 mmol/L (ref 3.5–5.1)
Sodium: 141 mmol/L (ref 135–145)

## 2019-06-27 LAB — MAGNESIUM
Magnesium: 1 mg/dL — ABNORMAL LOW (ref 1.7–2.4)
Magnesium: 2.5 mg/dL — ABNORMAL HIGH (ref 1.7–2.4)

## 2019-06-27 LAB — LACTIC ACID, PLASMA
Lactic Acid, Venous: 2.9 mmol/L (ref 0.5–1.9)
Lactic Acid, Venous: 3.8 mmol/L (ref 0.5–1.9)

## 2019-06-27 LAB — STREP PNEUMONIAE URINARY ANTIGEN: Strep Pneumo Urinary Antigen: NEGATIVE

## 2019-06-27 LAB — TROPONIN I (HIGH SENSITIVITY): Troponin I (High Sensitivity): 7 ng/L (ref <18)

## 2019-06-27 LAB — RESPIRATORY PANEL BY RT PCR (FLU A&B, COVID)
Influenza A by PCR: NEGATIVE
Influenza B by PCR: NEGATIVE
SARS Coronavirus 2 by RT PCR: NEGATIVE

## 2019-06-27 LAB — EXPECTORATED SPUTUM ASSESSMENT W GRAM STAIN, RFLX TO RESP C

## 2019-06-27 LAB — PHOSPHORUS: Phosphorus: 2 mg/dL — ABNORMAL LOW (ref 2.5–4.6)

## 2019-06-27 LAB — HIV ANTIBODY (ROUTINE TESTING W REFLEX): HIV Screen 4th Generation wRfx: NONREACTIVE

## 2019-06-27 MED ORDER — PHENOL 1.4 % MT LIQD
1.0000 | OROMUCOSAL | Status: DC | PRN
  Administered 2019-06-27 – 2019-06-30 (×3): 1 via OROMUCOSAL
  Filled 2019-06-27: qty 177

## 2019-06-27 MED ORDER — SODIUM CHLORIDE 0.9 % IV SOLN
250.0000 mL | INTRAVENOUS | Status: DC
  Administered 2019-07-01: 250 mL via INTRAVENOUS

## 2019-06-27 MED ORDER — VANCOMYCIN HCL 1500 MG/300ML IV SOLN
1500.0000 mg | INTRAVENOUS | Status: DC
  Filled 2019-06-27: qty 300

## 2019-06-27 MED ORDER — IPRATROPIUM-ALBUTEROL 0.5-2.5 (3) MG/3ML IN SOLN
3.0000 mL | Freq: Four times a day (QID) | RESPIRATORY_TRACT | Status: DC
  Administered 2019-06-27: 14:00:00 3 mL via RESPIRATORY_TRACT
  Filled 2019-06-27: qty 3

## 2019-06-27 MED ORDER — POTASSIUM CHLORIDE 20 MEQ PO PACK: 40.0000 meq | PACK | Freq: Once | ORAL | Status: DC

## 2019-06-27 MED ORDER — SODIUM CHLORIDE 0.9 % IV SOLN: 4.0000 g | Freq: Once | INTRAVENOUS | Status: DC

## 2019-06-27 MED ORDER — MAGNESIUM SULFATE 4 GM/100ML IV SOLN
4.0000 g | Freq: Once | INTRAVENOUS | Status: AC
  Administered 2019-06-27: 4 g via INTRAVENOUS
  Filled 2019-06-27: qty 100

## 2019-06-27 MED ORDER — INSULIN ASPART 100 UNIT/ML ~~LOC~~ SOLN
0.0000 [IU] | Freq: Three times a day (TID) | SUBCUTANEOUS | Status: DC
  Administered 2019-06-27 (×2): 15 [IU] via SUBCUTANEOUS
  Administered 2019-06-28: 8 [IU] via SUBCUTANEOUS
  Administered 2019-06-28: 3 [IU] via SUBCUTANEOUS
  Administered 2019-06-28: 11 [IU] via SUBCUTANEOUS
  Administered 2019-06-29: 12:00:00 5 [IU] via SUBCUTANEOUS
  Administered 2019-06-29: 3 [IU] via SUBCUTANEOUS
  Filled 2019-06-27 (×8): qty 1

## 2019-06-27 MED ORDER — SODIUM PHOSPHATES 45 MMOLE/15ML IV SOLN
20.0000 mmol | Freq: Once | INTRAVENOUS | Status: AC
  Administered 2019-06-27: 12:00:00 20 mmol via INTRAVENOUS
  Filled 2019-06-27: qty 6.67

## 2019-06-27 MED ORDER — INSULIN ASPART 100 UNIT/ML ~~LOC~~ SOLN: 0.0000 [IU] | Freq: Every day | SUBCUTANEOUS | Status: DC

## 2019-06-27 MED ORDER — LEVALBUTEROL HCL 0.63 MG/3ML IN NEBU
0.6300 mg | INHALATION_SOLUTION | Freq: Four times a day (QID) | RESPIRATORY_TRACT | Status: DC | PRN
  Administered 2019-06-28 – 2019-07-01 (×9): 0.63 mg via RESPIRATORY_TRACT
  Filled 2019-06-27 (×9): qty 3

## 2019-06-27 MED ORDER — NOREPINEPHRINE 4 MG/250ML-% IV SOLN
INTRAVENOUS | Status: AC
  Filled 2019-06-27: qty 250

## 2019-06-27 MED ORDER — CHLORHEXIDINE GLUCONATE CLOTH 2 % EX PADS
6.0000 | MEDICATED_PAD | Freq: Every day | CUTANEOUS | Status: DC
  Administered 2019-06-29 – 2019-07-05 (×6): 6 via TOPICAL

## 2019-06-27 MED ORDER — HYDROCOD POLST-CPM POLST ER 10-8 MG/5ML PO SUER: 5.0000 mL | Freq: Two times a day (BID) | ORAL | Status: DC | PRN

## 2019-06-27 MED ORDER — SODIUM CHLORIDE 0.9 % IV SOLN: INTRAVENOUS | Status: DC

## 2019-06-27 MED ORDER — FAMOTIDINE 20 MG PO TABS
20.0000 mg | ORAL_TABLET | Freq: Two times a day (BID) | ORAL | Status: DC
  Administered 2019-06-27 – 2019-07-05 (×18): 20 mg via ORAL
  Filled 2019-06-27 (×19): qty 1

## 2019-06-27 MED ORDER — APIXABAN 5 MG PO TABS
5.0000 mg | ORAL_TABLET | Freq: Two times a day (BID) | ORAL | Status: DC
  Administered 2019-06-27 – 2019-07-05 (×18): 5 mg via ORAL
  Filled 2019-06-27 (×18): qty 1

## 2019-06-27 MED ORDER — SODIUM CHLORIDE 0.9 % IV SOLN
500.0000 mg | INTRAVENOUS | Status: DC
  Administered 2019-06-27: 500 mg via INTRAVENOUS
  Filled 2019-06-27 (×2): qty 500

## 2019-06-27 MED ORDER — LACTATED RINGERS IV BOLUS: 1000.0000 mL | Freq: Once | INTRAVENOUS | Status: DC

## 2019-06-27 MED ORDER — IPRATROPIUM-ALBUTEROL 0.5-2.5 (3) MG/3ML IN SOLN
3.0000 mL | Freq: Three times a day (TID) | RESPIRATORY_TRACT | Status: DC
  Administered 2019-06-27 – 2019-06-28 (×2): 3 mL via RESPIRATORY_TRACT
  Filled 2019-06-27 (×2): qty 3

## 2019-06-27 MED ORDER — POTASSIUM CHLORIDE 20 MEQ PO PACK
40.0000 meq | PACK | Freq: Once | ORAL | Status: AC
  Filled 2019-06-27: qty 2

## 2019-06-27 MED ORDER — LEVALBUTEROL HCL 0.63 MG/3ML IN NEBU
0.6300 mg | INHALATION_SOLUTION | Freq: Four times a day (QID) | RESPIRATORY_TRACT | Status: DC
  Administered 2019-06-27 (×2): 0.63 mg via RESPIRATORY_TRACT
  Filled 2019-06-27 (×2): qty 3

## 2019-06-27 MED ORDER — INSULIN ASPART 100 UNIT/ML ~~LOC~~ SOLN
0.0000 [IU] | Freq: Three times a day (TID) | SUBCUTANEOUS | Status: DC
  Administered 2019-06-27: 5 [IU] via SUBCUTANEOUS
  Filled 2019-06-27: qty 1

## 2019-06-27 MED ORDER — BUDESONIDE 0.5 MG/2ML IN SUSP
0.5000 mg | Freq: Two times a day (BID) | RESPIRATORY_TRACT | Status: DC
  Administered 2019-06-27 – 2019-07-05 (×18): 0.5 mg via RESPIRATORY_TRACT
  Filled 2019-06-27 (×18): qty 2

## 2019-06-27 MED ORDER — NOREPINEPHRINE 4 MG/250ML-% IV SOLN
2.0000 ug/min | INTRAVENOUS | Status: DC
  Administered 2019-06-27: 2 ug/min via INTRAVENOUS

## 2019-06-27 MED ORDER — VANCOMYCIN HCL 2000 MG/400ML IV SOLN
2000.0000 mg | Freq: Once | INTRAVENOUS | Status: AC
  Administered 2019-06-27: 17:00:00 2000 mg via INTRAVENOUS
  Filled 2019-06-27 (×2): qty 400

## 2019-06-27 MED ORDER — INSULIN ASPART 100 UNIT/ML ~~LOC~~ SOLN
0.0000 [IU] | Freq: Every day | SUBCUTANEOUS | Status: DC
  Administered 2019-06-27: 22:00:00 4 [IU] via SUBCUTANEOUS
  Administered 2019-06-28: 2 [IU] via SUBCUTANEOUS
  Administered 2019-06-29: 4 [IU] via SUBCUTANEOUS
  Administered 2019-06-30 – 2019-07-03 (×3): 2 [IU] via SUBCUTANEOUS
  Administered 2019-07-04: 4 [IU] via SUBCUTANEOUS
  Filled 2019-06-27 (×7): qty 1

## 2019-06-27 MED ORDER — ENSURE MAX PROTEIN PO LIQD
11.0000 [oz_av] | Freq: Two times a day (BID) | ORAL | Status: DC
  Administered 2019-06-27: 22:00:00 237 mL via ORAL
  Administered 2019-06-27 – 2019-07-04 (×7): 11 [oz_av] via ORAL
  Filled 2019-06-27: qty 330

## 2019-06-27 NOTE — Consult Note (Signed)
Pharmacy Antibiotic Note  Philip Prenatt. is a 74 y.o. male admitted on 06/26/2019 with CAP with sepsis and septic shock, COPD exacerbation, AKI, A.fib. WBC trending up 4.0 >> 17.4, procal trending up 7.19 >> 26.8, afebrile. Pt was on azithromycin/cefepime for sepsis. Pt is now on azithromycin/ceftriaxone. Pharmacy has been consulted for vancomycin dosing.  Plan: Pt was given vancomycin 2000 mg/400 ml at 26ml/hr bolus.  Will order vancomycin 1500 mg q24h IV   Scr 1.52 AUC 498.8 Cmax 33.4 Cmin 12.6  Height: 5\' 11"  (180.3 cm) Weight: 212 lb 15.4 oz (96.6 kg) IBW/kg (Calculated) : 75.3  Temp (24hrs), Avg:98.7 F (37.1 C), Min:97.6 F (36.4 C), Max:100 F (37.8 C)  Recent Labs  Lab 06/26/19 2145 06/26/19 2224 06/26/19 2336 06/27/19 0150 06/27/19 0508  WBC 4.0  --   --   --  17.4*  CREATININE  --  1.44*  --   --  1.52*  LATICACIDVEN  --  2.6* 2.9* 3.8*  --     Estimated Creatinine Clearance: 52.1 mL/min (A) (by C-G formula based on SCr of 1.52 mg/dL (H)).    Allergies  Allergen Reactions  . Bydureon [Exenatide] Anaphylaxis    Possible from bydureon, not 100% confirmed  . Clindamycin/Lincomycin Hives and Itching  . Penicillins Hives, Itching, Rash and Other (See Comments)    Has patient had a PCN reaction causing immediate rash, facial/tongue/throat swelling, SOB or lightheadedness with hypotension: Yes Has patient had a PCN reaction causing severe rash involving mucus membranes or skin necrosis: No Has patient had a PCN reaction that required hospitalization: No Has patient had a PCN reaction occurring within the last 10 years: No If all of the above answers are "NO", then may proceed with Cephalosporin use.  . Sulfa Antibiotics Shortness Of Breath  . Spiriferis Other (See Comments)    Affected breathing  . Spiriva Handihaler [Tiotropium Bromide Monohydrate]     Affected breathing  . Ivp Dye [Iodinated Diagnostic Agents] Rash and Other (See Comments)    Redness     Antimicrobials this admission: Azithromycin 2/25 >>  Cefepime 2/25 >> 2/26 Ceftriaxone 2/26 >>  Vancomycin 2/26 >>  Dose adjustments this admission:   Microbiology results: 2/25 BCx: GPC 2/26 UCx: pending  2/26 MRSA PCR: NEG COVID/Influenza A/B NEG  Thank you for allowing pharmacy to be a part of this patient's care.  [place] student 06/27/2019 3:18 PM

## 2019-06-27 NOTE — Evaluation (Signed)
Physical Therapy Evaluation Patient Details Name: Philip KIRN Sr. MRN: [id number] DOB: [birth year] Today's Date: 06/27/2019   History of Present Illness  presented to ER secondary to dyspnea, cough, fever and chills; admitted for management of acute/chronic respiratory failure secondary to CAD/COPD with L LL PNA.  Noted in Afib with RVR upon arrival to ER.  Clinical Impression  Upon evaluation, patient alert and oriented; follows commands and demonstrates good effort with mobility tasks.  Eager for OOB to chair and gait as tolerated.  Bilat UE/LE strength and ROM grossly symmetrical and WFL; no focal weakness, no pain reported.   Does endorse mild dizziness with initial transition to upright; vitals stable and WFL (appropriate response to activity) and symptoms resolve within 15-20 seconds of accommodation to position.  Able to complete bed mobility with mod indep; sit/stand, basic transfers and gait (180') without assist device, sup.  Demonstrates reciprocal stepping pattern with good step height/length, good cadence and overall gait mechanics. Mild sway, but no overt buckling or LOB. Mild SOB with exertion, but vitals stable and WFL on RA.  Did introduce concept of activity pacing, awareness of signs/symptoms of fatigue-patient voices understanding, but requires min cuing for integration into mobility tasks. Would benefit from skilled PT to address above deficits and promote optimal return to PLOF.; will maintain on caseload to facilitate progressive mobilization during remaining hospitalization.  Anticipate no formal PT needs upon discharge; may benefit from referral to outpatient pulmonary rehab program education/training in chronic disease management.    Follow Up Recommendations No PT follow up    Equipment Recommendations       Recommendations for Other Services (outpatient pulmonary rehab program)     Precautions / Restrictions Precautions Precautions: Fall Restrictions Weight  Bearing Restrictions: No      Mobility  Bed Mobility Overal bed mobility: Modified Independent                Transfers Overall transfer level: Needs assistance Equipment used: None Transfers: Sit to/from Stand Sit to Stand: Supervision         General transfer comment: mild dizziness with initial transition to standing position; resolves within 15-20 seconds of accommodation to position  Ambulation/Gait Ambulation/Gait assistance: Supervision Gait Distance (Feet): 180 Feet Assistive device: None       General Gait Details: reciprocal stepping pattern with good step height/length, good cadence and overall gait mechanics. Mild SOB with exertion, but vitals stable and WFL on RA.  Did introduce concept of activity pacing, awareness of signs/symptoms of fatigue-patient voices understanding, but requires min cuing for integration into mobility tasks.  Stairs            Wheelchair Mobility    Modified Rankin (Stroke Patients Only)       Balance Overall balance assessment: Needs assistance Sitting-balance support: No upper extremity supported;Feet supported Sitting balance-Leahy Scale: Good     Standing balance support: Bilateral upper extremity supported Standing balance-Leahy Scale: Fair                               Pertinent Vitals/Pain Pain Assessment: No/denies pain    Home Living Family/patient expects to be discharged to:: Private residence Living Arrangements: Spouse/significant other Available Help at Discharge: Family Type of Home: House Home Access: Stairs to enter   Technical brewer of Steps: 1 Home Layout: One level Home Equipment: None      Prior Function Level of Independence: Independent  Comments: Indep with ADLs, household and community mobilization without assist device; no home O2; denies fall history.  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)  Cognition Arousal/Alertness: Awake/alert Behavior During Therapy: WFL for tasks assessed/performed Overall Cognitive Status: Within Functional Limits for tasks assessed                                        General Comments      Exercises Other Exercises Other Exercises: Bed/chair without assist device, sup; good LE strength/stability, minimal use of UEs required Other Exercises: Sit/stand from bed and recliner without assist device, sup/mod indep   Assessment/Plan    PT Assessment Patient needs continued PT services  PT Problem List Decreased activity tolerance;Decreased balance;Decreased mobility;Cardiopulmonary status limiting activity       PT Treatment Interventions DME instruction;Gait training;Stair training;Functional mobility training;Therapeutic activities;Therapeutic exercise;Balance training;Patient/family education    PT Goals (Current goals can be found in the Care Plan section)  Acute Rehab PT Goals Patient Stated Goal: to get back home and be able to work again PT Goal Formulation: With patient Time For Goal Achievement: 07/11/19 Potential to Achieve Goals: Good    Frequency Min 2X/week   Barriers to discharge Decreased caregiver support      Co-evaluation               AM-PAC PT "6 Clicks" Mobility  Outcome Measure Help needed turning from your back to your side while in a flat bed without using bedrails?: None Help needed moving from lying on your back to sitting on the side of a flat bed without using bedrails?: None Help needed moving to and from a bed to a chair (including a wheelchair)?: None Help needed standing  up from a chair using your arms (e.g., wheelchair or bedside chair)?: None Help needed to walk in hospital room?: None Help needed climbing 3-5 steps with a railing? : A Little 6 Click Score: 23    End of Session   Activity Tolerance: Patient tolerated treatment well Patient left: in chair;with call bell/phone within reach Nurse Communication: Mobility status PT Visit Diagnosis: Difficulty in walking, not elsewhere classified (R26.2)    Time: [id number] PT Time Calculation (min) (ACUTE ONLY): 22 min   Charges:   PT Evaluation $PT Eval Moderate Complexity: 1 Mod PT Treatments $Therapeutic Activity: 8-22 mins       Saja Bartolini H. Owens Shark, PT, DPT, NCS 06/27/19, 10:08 AM [phone]

## 2019-06-27 NOTE — Consult Note (Signed)
Name: Philip SCHWIEGER Sr. MRN: [id number] DOB: [birth year]    ADMISSION DATE:  06/26/2019 CONSULTATION DATE: 06/27/2019  REFERRING MD : Dr. Sidney Ace   CHIEF COMPLAINT: Shortness of Breath   BRIEF PATIENT DESCRIPTION:  74 yo male admitted with septic shock and acute on chronic respiratory failure secondary to suspected CAP and AECOPD requiring levophed gtt   SIGNIFICANT EVENTS/STUDIES:  02/25: Pt admitted to the medsurg unit  02/26: While awaiting medsurg availability pt developed hypotension requiring levophed gtt and transfer to ICU.  PCCM team consulted to assist with management  HISTORY OF PRESENT ILLNESS:   This is a 74 yo male with a PMH of Type II Diabetes Mellitus, OSA (does not wear CPAP), Shingles, Paroxymal Atrial Fibrillation on Eliquis, Neuropathy, Hypercholesteremia, HOH, Bronchitis, Chronic Diastolic CHF (EF 41-63%), CABG, CAD, COPD, Atrial Tachycardia, Asthma, and Arthritis.  He presented to [facility] ER on 02/25 via EMS with shortness of breath onset 02/25.  He also endorsed chills, shortness of breath, and a fever. He also states he returned to work this week (he works for [employer]). Upon EMS arrival he received solumedrol and duoneb's x2.  It was also noted cardiac rhythm atrial fibrillation with rvr hr 140's bpm.  In the ER lab results revealed K+ 3.2, CO2 21, glucose 194, BUN 24, creatinine 1.44, lactic acid 2.6, pct 7.19, and BNP 583.  Influenza PCR/COVID-19 negative, however CXR concerning mild vascular congestion and LLL consolidation vs. atelectasis. Vital signs: temp 100 F, bp 114/66, hr 130's, rr 24, and O2 sats 94%.  Sepsis protocol initiated pt received 1L LR bolus, 2L NS bolus, cefepime, solumedrol, azithromycin, and duoneb treatments.  Pt initially admitted to the medsurg unit per hospitalist team for additional workup and treatment.  However, he later developed hypotension requiring levophed gtt and transfer to ICU.  PCCM team consulted to assist with  management.   PAST MEDICAL HISTORY :   has a past medical history of Arthritis, Asthma, Atrial tachycardia ([place]), Collapse of right lung, COPD (chronic obstructive pulmonary disease) ([place]), Coronary artery disease, Diastolic dysfunction, Edema, Essential hypertension, History of bronchitis, HOH (hard of hearing), Hypercholesteremia, Neuropathy, PAF (paroxysmal atrial fibrillation) ([place]), Shingles, Sleep apnea, and Type II diabetes mellitus ([place]).  has a past surgical history that includes Knee surgery (2003); Neck surgery (2008); Knee arthroscopy (~ 2000); Anterior cervical decomp/discectomy fusion (~ 2009); Intraoprative transesophageal echocardiogram (05/07/2012); Coronary artery bypass graft (05/07/2012); Cardiac catheterization (05/06/2012); Coronary angioplasty; Cataract extraction w/PHACO (Right, 08/15/2017); Back surgery; and Cataract extraction w/PHACO (Left, 10/03/2017). Prior to Admission medications   Medication Sig Start Date End Date Taking? Authorizing Provider  albuterol (PROVENTIL) (2.5 MG/3ML) 0.083% nebulizer solution Inhale 3 mLs into the lungs every 6 (six) hours as needed for wheezing or shortness of breath.  03/13/16  Yes [provider]  Albuterol Sulfate 108 (90 Base) MCG/ACT AEPB Inhale 2 puffs into the lungs every 6 (six) hours as needed (shortness of breath).    Yes [provider]  Artificial Tear Ointment (DRY EYES OP) Place 1 drop into both eyes daily as needed (for dry eyes).   Yes [provider]  atorvastatin (LIPITOR) 40 MG tablet Take 1 tablet by mouth once daily 09/30/18  Yes [name], MD  cetirizine (ZYRTEC) 10 MG tablet Take 10 mg by mouth daily.    Yes [provider]  diclofenac sodium (VOLTAREN) 1 % GEL Apply 2 g topically 3 (three) times daily as needed (Use on elbow bursitis and knee). 07/04/17  Yes [name], DO  ELIQUIS 5 MG TABS tablet Take 1 tablet by mouth twice daily 11/27/18  Yes [name], MD    fluticasone (FLONASE) 50 MCG/ACT nasal spray Place 1 spray into both nostrils daily as needed for allergies or rhinitis.   Yes [provider]  furosemide (LASIX) 40 MG tablet TAKE 1 TABLET BY MOUTH ONCE DAILY. APPOINTMENT NEEDED FOR FURTHER REFILLS. Patient taking differently: Every other day 12/31/18  Yes Dunn, Areta Haber, [name]  gabapentin (NEURONTIN) 100 MG capsule TAKE 1 CAPSULE BY MOUTH THREE TIMES DAILY 05/22/19  Yes [name], [name], DO  ipratropium-albuterol (DUONEB) 0.5-2.5 (3) MG/3ML SOLN Take 3 mLs by nebulization 4 (four) times daily. 02/12/19  Yes [provider]  levothyroxine (SYNTHROID) 88 MCG tablet Take 1 tablet by mouth once daily 03/16/19  Yes [name], [name], DO  losartan (COZAAR) 25 MG tablet Take 1 tablet by mouth once daily 03/31/19  Yes Arida, Mertie Clause, MD  metFORMIN (GLUCOPHAGE) 500 MG tablet TAKE 2 TABLETS BY MOUTH TWICE DAILY WITH A MEAL Patient taking differently: Take 1,000 mg by mouth 2 (two) times daily with a meal.  05/12/19  Yes [name], [name], DO  metoprolol tartrate (LOPRESSOR) 25 MG tablet TAKE 1 TABLET BY MOUTH TWICE DAILY . APPOINTMENT REQUIRED FOR FUTURE REFILLS Patient taking differently: Take 25 mg by mouth 2 (two) times daily.  05/22/19  Yes [name], MD  montelukast (SINGULAIR) 10 MG tablet Take 10 mg by mouth at bedtime.  02/08/15  Yes [provider]  predniSONE (DELTASONE) 5 MG tablet Take 5 mg by mouth every other day.  09/04/17  Yes [provider]  SYMBICORT 160-4.5 MCG/ACT inhaler Inhale 2 puffs 2 (two) times daily into the lungs.  08/11/16  Yes [provider]  Blood Glucose Monitoring Suppl (ONE TOUCH ULTRA 2) w/Device KIT Use to check blood sugar as advised up to twice a day 03/10/19   [name], [name], DO  guaiFENesin (MUCINEX) 600 MG 12 hr tablet Take 600 mg by mouth 2 (two) times daily as needed.    [provider]  LANCETS ULTRA THIN MISC 1 Device by Does  not apply route 4 (four) times daily -  before meals and at bedtime. 05/17/12   [name], MD  [facility] ULTRA test strip USE TEST STRIP(S) TO CHECK GLUCOSE TWICE DAILY 12/10/18   [name], DO   Allergies  Allergen Reactions  . Bydureon [Exenatide] Anaphylaxis    Possible from bydureon, not 100% confirmed  . Clindamycin/Lincomycin Hives and Itching  . Penicillins Hives, Itching, Rash and Other (See Comments)    Has patient had a PCN reaction causing immediate rash, facial/tongue/throat swelling, SOB or lightheadedness with hypotension: Yes Has patient had a PCN reaction causing severe rash involving mucus membranes or skin necrosis: No Has patient had a PCN reaction that required hospitalization: No Has patient had a PCN reaction occurring within the last 10 years: No If all of the above answers are "NO", then may proceed with Cephalosporin use.  . Sulfa Antibiotics Shortness Of Breath  . Spiriferis Other (See Comments)    Affected breathing  . Spiriva Handihaler [Tiotropium Bromide Monohydrate]     Affected breathing  . Ivp Dye [Iodinated Diagnostic Agents] Rash and Other (See Comments)    Redness    FAMILY HISTORY:  family history includes Clotting disorder in his mother; Heart disease in his mother; Hypertension in his sister. SOCIAL HISTORY:  reports that he  quit smoking about 51 years ago. His smoking use included cigarettes. He has a 8.00 pack-year smoking history. He has quit using smokeless tobacco. He reports that he does not drink alcohol or use drugs.  REVIEW OF SYSTEMS: Positives in BOLD  Constitutional: fever, chills, weight loss, malaise/fatigue and diaphoresis.  HENT: hearing loss, ear pain, nosebleeds, congestion, sore throat, neck pain, tinnitus and ear discharge.   Eyes: Negative for blurred vision, double vision, photophobia, pain, discharge and redness.  Respiratory: cough, hemoptysis, sputum production, shortness of breath, wheezing and  stridor.   Cardiovascular: Negative for chest pain, palpitations, orthopnea, claudication, leg swelling and PND.  Gastrointestinal: Negative for heartburn, nausea, vomiting, abdominal pain, diarrhea, constipation, blood in stool and melena.  Genitourinary: Negative for dysuria, urgency, frequency, hematuria and flank pain.  Musculoskeletal: Negative for myalgias, back pain, joint pain and falls.  Skin: Negative for itching and rash.  Neurological: Negative for dizziness, tingling, tremors, sensory change, speech change, focal weakness, seizures, loss of consciousness, weakness and headaches.  Endo/Heme/Allergies: Negative for environmental allergies and polydipsia. Does not bruise/bleed easily.  SUBJECTIVE:   VITAL SIGNS: Temp:  [99.4 F (37.4 C)-100 F (37.8 C)] 99.4 F (37.4 C) (02/25 2331) Pulse Rate:  [053-976] 108 (02/26 0028) Resp:  [17-24] 20 (02/26 0028) BP: (72-114)/(47-66) 83/50 (02/26 0028) SpO2:  [90 %-97 %] 96 % (02/26 0028) Weight:  [93.9 kg] 93.9 kg (02/25 2140)  PHYSICAL EXAMINATION: General: acutely ill appearing male, NAD resting in bed  Neuro: alert and oriented, follows commands  HEENT: voice hoarseness, no JVD  Cardiovascular: irregular irregular, no R/G Lungs: expiratory wheezes with crackles throughout, even, non labored  Abdomen: +BS x4, soft, obese, non tender, non distended  Musculoskeletal: normal bulk and tone, no edema  Skin: intact no rashes or lesions present   Recent Labs  Lab 06/26/19 2224  NA 140  K 3.2*  CL 110  CO2 21*  BUN 24*  CREATININE 1.44*  GLUCOSE 194*   Recent Labs  Lab 06/26/19 2145  HGB 13.9  HCT 40.4  WBC 4.0  PLT 162   DG Chest Port 1 View  Result Date: 06/26/2019 CLINICAL DATA:  Short of breath, tachycardia EXAM: PORTABLE CHEST 1 VIEW COMPARISON:  05/08/2016 FINDINGS: Single frontal view of the chest demonstrates mild enlargement the cardiac silhouette. 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 There is mild central  vascular congestion. Left lower lobe consolidation is noted. No effusion or pneumothorax. IMPRESSION: 1. Left lower lobe consolidation which may reflect atelectasis or airspace disease. 2. Mild central vascular congestion. Electronically Signed   By: Randa Ngo M.D.   On: 06/26/2019 22:02    ASSESSMENT / PLAN:  Acute on chronic respiratory failure secondary to CAP and AECOPD  Hx: Bronchitis and Asthma  Prn Supplemental O2 for dyspnea and/or hypoxia  Scheduled and prn bronchodilator therapy  Nebulized and iv steroids  Trend WBC and monitor fever curve  Trend PCT and lactic acid Strep A PCR pending  Follow cultures  UA pending  Continue ceftriaxone and azithromycin   Hypotension secondary to septic shock  Atrial fibrillation with RVR-improving  Hx: Chronic diastolic CHF, CAD, Atrial Tachycardia, Essential HTN, and Hypercholesteremia  Continuous telemetry monitoring  Prn levophed gtt to maintain map goal >65 Hold outpatient antihypertensives  Continue outpatient eliquis   Acute renal failure secondary to sepsis Lactic acidosis   Hypokalemia  Trend BMP  Replace electrolytes as indicated  Monitor UOP Avoid nephrotoxic medications   Type II diabetes mellitus  CBG ac/hs SSI  Marda Stalker, [place] Pager [phone] (please enter 7 digits) PCCM Consult Pager [phone] (please enter 7 digits)

## 2019-06-27 NOTE — Progress Notes (Signed)
CODE SEPSIS - PHARMACY COMMUNICATION  **Broad Spectrum Antibiotics should be administered within 1 hour of Sepsis diagnosis**  Time Code Sepsis Called/Page Received: 2239  Antibiotics Ordered: azithro/cefepime  Time of 1st antibiotic administration: 2248  Additional action taken by pharmacy:   If necessary, Name of Provider/Nurse Contacted:     Tobie Lords ,PharmD Clinical Pharmacist  06/27/2019  12:02 AM

## 2019-06-27 NOTE — Progress Notes (Signed)
Initial Nutrition Assessment  DOCUMENTATION CODES:   Not applicable  INTERVENTION:   Ensure Max protein supplement BID, each supplement provides 150kcal and 30g of protein.  Carbohydrate modified/3g sodium diet via [employer]  NUTRITION DIAGNOSIS:   Increased nutrient needs related to catabolic illness(COPD) as evidenced by increased estimated needs.  GOAL:   Patient will meet greater than or equal to 90% of their needs  MONITOR:   PO intake, Supplement acceptance, Labs, Weight trends, Skin, I & O's  REASON FOR ASSESSMENT:   Consult Assessment of nutrition requirement/status  ASSESSMENT:   74 yo male with a PMH of Type II Diabetes Mellitus, OSA (does not wear CPAP), Shingles, Paroxymal Atrial Fibrillation on Eliquis, Neuropathy, Hypercholesteremia, HOH, Bronchitis, Chronic Diastolic CHF (EF [id number]), CABG, CAD, COPD, Atrial Tachycardia, Asthma, and Arthritis.  He presented to [facility] ER on 02/25 via EMS with shortness of breath onset 02/25 r/t CAP AND COPD exacerbation   Pt with fairly good appetite and oral intake in hospital; pt ate 70% of his breakfast this morning. RD will add supplements to help pt meet his estimated needs. Per chart, pt appears weight stable at baseline. Pt with hypophosphatemia today; this is being supplemented.   Medications reviewed and include: pepcid, insulin, solu-medrol, azithromycin, ceftriaxone, Levophed, Na Phos   Labs reviewed: BUN 26(H), creat 1.52(H), P 2.0(L), Mg 2.5(H) cbgs- 195, 257, 257, 327 x 24 hrs AIC 7.9(H)- 2/26  NUTRITION - FOCUSED PHYSICAL EXAM:    Most Recent Value  Orbital Region  No depletion  Upper Arm Region  No depletion  Thoracic and Lumbar Region  No depletion  Buccal Region  No depletion  Temple Region  No depletion  Clavicle Bone Region  No depletion  Clavicle and Acromion Bone Region  No depletion  Scapular Bone Region  No depletion  Dorsal Hand  No depletion  Patellar Region  No depletion  Anterior Thigh  Region  No depletion  Posterior Calf Region  No depletion  Edema (RD Assessment)  None  Hair  Reviewed  Eyes  Reviewed  Mouth  Reviewed  Skin  Reviewed  Nails  Reviewed     Diet Order:   Diet Order            Diet Heart Room service appropriate? Yes; Fluid consistency: Thin  Diet effective now             EDUCATION NEEDS:   Education needs have been addressed  Skin:  Skin Assessment: Reviewed RN Assessment(ecchymosis)  Last BM:  2/25  Height:   Ht Readings from Last 1 Encounters:  06/27/19 5\' 11"  (1.803 m)    Weight:   Wt Readings from Last 1 Encounters:  06/27/19 96.6 kg    Ideal Body Weight:  78.2 kg  BMI:  Body mass index is 29.7 kg/m.  Estimated Nutritional Needs:   Kcal:  2100-2400kcal/day  Protein:  105-120g/day  Fluid:  >2L/day  Koleen Distance MS, RD, LDN Contact information available in Amion

## 2019-06-27 NOTE — Progress Notes (Signed)
Name: Philip SCHWIEGER Sr. MRN: [id number] DOB: [birth year]    ADMISSION DATE:  06/26/2019 CONSULTATION DATE: 06/27/2019  REFERRING MD : Dr. Sidney Ace   CHIEF COMPLAINT: Shortness of Breath   BRIEF PATIENT DESCRIPTION:  74 yo male admitted with septic shock and acute on chronic respiratory failure secondary to suspected CAP and AECOPD requiring levophed gtt   SIGNIFICANT EVENTS/STUDIES:  02/25: Pt admitted to the medsurg unit  02/26: While awaiting medsurg availability pt developed hypotension requiring levophed gtt and transfer to ICU.  PCCM team consulted to assist with management  HISTORY OF PRESENT ILLNESS:   This is a 74 yo male with a PMH of Type II Diabetes Mellitus, OSA (does not wear CPAP), Shingles, Paroxymal Atrial Fibrillation on Eliquis, Neuropathy, Hypercholesteremia, HOH, Bronchitis, Chronic Diastolic CHF (EF 41-63%), CABG, CAD, COPD, Atrial Tachycardia, Asthma, and Arthritis.  He presented to [facility] ER on 02/25 via EMS with shortness of breath onset 02/25.  He also endorsed chills, shortness of breath, and a fever. He also states he returned to work this week (he works for [employer]). Upon EMS arrival he received solumedrol and duoneb's x2.  It was also noted cardiac rhythm atrial fibrillation with rvr hr 140's bpm.  In the ER lab results revealed K+ 3.2, CO2 21, glucose 194, BUN 24, creatinine 1.44, lactic acid 2.6, pct 7.19, and BNP 583.  Influenza PCR/COVID-19 negative, however CXR concerning mild vascular congestion and LLL consolidation vs. atelectasis. Vital signs: temp 100 F, bp 114/66, hr 130's, rr 24, and O2 sats 94%.  Sepsis protocol initiated pt received 1L LR bolus, 2L NS bolus, cefepime, solumedrol, azithromycin, and duoneb treatments.  Pt initially admitted to the medsurg unit per hospitalist team for additional workup and treatment.  However, he later developed hypotension requiring levophed gtt and transfer to ICU.  PCCM team consulted to assist with  management.   PAST MEDICAL HISTORY :   has a past medical history of Arthritis, Asthma, Atrial tachycardia ([place]), Collapse of right lung, COPD (chronic obstructive pulmonary disease) ([place]), Coronary artery disease, Diastolic dysfunction, Edema, Essential hypertension, History of bronchitis, HOH (hard of hearing), Hypercholesteremia, Neuropathy, PAF (paroxysmal atrial fibrillation) ([place]), Shingles, Sleep apnea, and Type II diabetes mellitus ([place]).  has a past surgical history that includes Knee surgery (2003); Neck surgery (2008); Knee arthroscopy (~ 2000); Anterior cervical decomp/discectomy fusion (~ 2009); Intraoprative transesophageal echocardiogram (05/07/2012); Coronary artery bypass graft (05/07/2012); Cardiac catheterization (05/06/2012); Coronary angioplasty; Cataract extraction w/PHACO (Right, 08/15/2017); Back surgery; and Cataract extraction w/PHACO (Left, 10/03/2017). Prior to Admission medications   Medication Sig Start Date End Date Taking? Authorizing Provider  albuterol (PROVENTIL) (2.5 MG/3ML) 0.083% nebulizer solution Inhale 3 mLs into the lungs every 6 (six) hours as needed for wheezing or shortness of breath.  03/13/16  Yes [provider]  Albuterol Sulfate 108 (90 Base) MCG/ACT AEPB Inhale 2 puffs into the lungs every 6 (six) hours as needed (shortness of breath).    Yes [provider]  Artificial Tear Ointment (DRY EYES OP) Place 1 drop into both eyes daily as needed (for dry eyes).   Yes [provider]  atorvastatin (LIPITOR) 40 MG tablet Take 1 tablet by mouth once daily 09/30/18  Yes [name], MD  cetirizine (ZYRTEC) 10 MG tablet Take 10 mg by mouth daily.    Yes [provider]  diclofenac sodium (VOLTAREN) 1 % GEL Apply 2 g topically 3 (three) times daily as needed (Use on elbow bursitis and knee). 07/04/17  Yes [name], DO  ELIQUIS 5 MG TABS tablet Take 1 tablet by mouth twice daily 11/27/18  Yes [name], MD    fluticasone (FLONASE) 50 MCG/ACT nasal spray Place 1 spray into both nostrils daily as needed for allergies or rhinitis.   Yes [provider]  furosemide (LASIX) 40 MG tablet TAKE 1 TABLET BY MOUTH ONCE DAILY. APPOINTMENT NEEDED FOR FURTHER REFILLS. Patient taking differently: Every other day 12/31/18  Yes Dunn, Areta Haber, [name]  gabapentin (NEURONTIN) 100 MG capsule TAKE 1 CAPSULE BY MOUTH THREE TIMES DAILY 05/22/19  Yes [name], [name], DO  ipratropium-albuterol (DUONEB) 0.5-2.5 (3) MG/3ML SOLN Take 3 mLs by nebulization 4 (four) times daily. 02/12/19  Yes [provider]  levothyroxine (SYNTHROID) 88 MCG tablet Take 1 tablet by mouth once daily 03/16/19  Yes [name], [name], DO  losartan (COZAAR) 25 MG tablet Take 1 tablet by mouth once daily 03/31/19  Yes Arida, Mertie Clause, MD  metFORMIN (GLUCOPHAGE) 500 MG tablet TAKE 2 TABLETS BY MOUTH TWICE DAILY WITH A MEAL Patient taking differently: Take 1,000 mg by mouth 2 (two) times daily with a meal.  05/12/19  Yes [name], [name], DO  metoprolol tartrate (LOPRESSOR) 25 MG tablet TAKE 1 TABLET BY MOUTH TWICE DAILY . APPOINTMENT REQUIRED FOR FUTURE REFILLS Patient taking differently: Take 25 mg by mouth 2 (two) times daily.  05/22/19  Yes [name], MD  montelukast (SINGULAIR) 10 MG tablet Take 10 mg by mouth at bedtime.  02/08/15  Yes [provider]  predniSONE (DELTASONE) 5 MG tablet Take 5 mg by mouth every other day.  09/04/17  Yes [provider]  SYMBICORT 160-4.5 MCG/ACT inhaler Inhale 2 puffs 2 (two) times daily into the lungs.  08/11/16  Yes [provider]  Blood Glucose Monitoring Suppl (ONE TOUCH ULTRA 2) w/Device KIT Use to check blood sugar as advised up to twice a day 03/10/19   [name], [name], DO  guaiFENesin (MUCINEX) 600 MG 12 hr tablet Take 600 mg by mouth 2 (two) times daily as needed.    [provider]  LANCETS ULTRA THIN MISC 1 Device by Does  not apply route 4 (four) times daily -  before meals and at bedtime. 05/17/12   [name], MD  [facility] ULTRA test strip USE TEST STRIP(S) TO CHECK GLUCOSE TWICE DAILY 12/10/18   [name], DO   Allergies  Allergen Reactions  . Bydureon [Exenatide] Anaphylaxis    Possible from bydureon, not 100% confirmed  . Clindamycin/Lincomycin Hives and Itching  . Penicillins Hives, Itching, Rash and Other (See Comments)    Has patient had a PCN reaction causing immediate rash, facial/tongue/throat swelling, SOB or lightheadedness with hypotension: Yes Has patient had a PCN reaction causing severe rash involving mucus membranes or skin necrosis: No Has patient had a PCN reaction that required hospitalization: No Has patient had a PCN reaction occurring within the last 10 years: No If all of the above answers are "NO", then may proceed with Cephalosporin use.  . Sulfa Antibiotics Shortness Of Breath  . Spiriferis Other (See Comments)    Affected breathing  . Spiriva Handihaler [Tiotropium Bromide Monohydrate]     Affected breathing  . Ivp Dye [Iodinated Diagnostic Agents] Rash and Other (See Comments)    Redness    FAMILY HISTORY:  family history includes Clotting disorder in his mother; Heart disease in his mother; Hypertension in his sister. SOCIAL HISTORY:  reports that he  quit smoking about 51 years ago. His smoking use included cigarettes. He has a 8.00 pack-year smoking history. He has quit using smokeless tobacco. He reports that he does not drink alcohol or use drugs.  REVIEW OF SYSTEMS: Positives in BOLD  Constitutional: fever, chills, weight loss, malaise/fatigue and diaphoresis.  HENT: hearing loss, ear pain, nosebleeds, congestion, sore throat, neck pain, tinnitus and ear discharge.   Eyes: Negative for blurred vision, double vision, photophobia, pain, discharge and redness.  Respiratory: cough, hemoptysis, sputum production, shortness of breath, wheezing and  stridor.   Cardiovascular: Negative for chest pain, palpitations, orthopnea, claudication, leg swelling and PND.  Gastrointestinal: Negative for heartburn, nausea, vomiting, abdominal pain, diarrhea, constipation, blood in stool and melena.  Genitourinary: Negative for dysuria, urgency, frequency, hematuria and flank pain.  Musculoskeletal: Negative for myalgias, back pain, joint pain and falls.  Skin: Negative for itching and rash.  Neurological: Negative for dizziness, tingling, tremors, sensory change, speech change, focal weakness, seizures, loss of consciousness, weakness and headaches.  Endo/Heme/Allergies: Negative for environmental allergies and polydipsia. Does not bruise/bleed easily.  SUBJECTIVE:   VITAL SIGNS: Temp:  [97.6 F (36.4 C)-100 F (37.8 C)] 97.6 F (36.4 C) (02/26 0800) Pulse Rate:  [65-139] 78 (02/26 0800) Resp:  [14-24] 20 (02/26 0800) BP: (72-125)/(47-82) 125/82 (02/26 0800) SpO2:  [90 %-98 %] 98 % (02/26 0956) Weight:  [93.9 kg-96.6 kg] 96.6 kg (02/26 0125)  PHYSICAL EXAMINATION: General: acutely ill appearing male, NAD resting in bed  Neuro: alert and oriented, follows commands  HEENT: voice hoarseness, no JVD  Cardiovascular: irregular irregular, no R/G Lungs: expiratory wheezes with crackles throughout, even, non labored  Abdomen: +BS x4, soft, obese, non tender, non distended  Musculoskeletal: normal bulk and tone, no edema  Skin: intact no rashes or lesions present   Recent Labs  Lab 06/26/19 2224 06/27/19 0508  NA 140 141  K 3.2* 4.5  CL 110 111  CO2 21* 19*  BUN 24* 26*  CREATININE 1.44* 1.52*  GLUCOSE 194* 255*   Recent Labs  Lab 06/26/19 2145 06/27/19 0508  HGB 13.9 13.5  HCT 40.4 39.6  WBC 4.0 17.4*  PLT 162 216   DG Chest Port 1 View  Result Date: 06/26/2019 CLINICAL DATA:  Short of breath, tachycardia EXAM: PORTABLE CHEST 1 VIEW COMPARISON:  05/08/2016 FINDINGS: Single frontal view of the chest demonstrates mild enlargement  the cardiac silhouette. 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 There is mild central vascular congestion. Left lower lobe consolidation is noted. No effusion or pneumothorax. IMPRESSION: 1. Left lower lobe consolidation which may reflect atelectasis or airspace disease. 2. Mild central vascular congestion. Electronically Signed   By: Randa Ngo M.D.   On: 06/26/2019 22:02      ASSESSMENT / PLAN:  Acute on chronic respiratory failure secondary to CAP and AECOPD  Hx: Bronchitis and Asthma  Prn Supplemental O2 for dyspnea and/or hypoxia  Scheduled and prn bronchodilator therapy  Nebulized and iv steroids  Trend WBC and monitor fever curve  Trend PCT and lactic acid Strep A PCR pending  Follow cultures  UA pending  Continue ceftriaxone and azithromycin   Hypotension secondary to septic shock  Atrial fibrillation with RVR-improving  Hx: Chronic diastolic CHF, CAD, Atrial Tachycardia, Essential HTN, and Hypercholesteremia  Continuous telemetry monitoring  Prn levophed gtt to maintain map goal >65 Hold outpatient antihypertensives  Continue outpatient eliquis   Acute on chronic renal failure secondary to sepsis Lactic acidosis   Hypokalemia  Trend BMP  Replace  electrolytes as indicated  Monitor UOP Avoid nephrotoxic medications   Type II diabetes mellitus  CBG ac/hs SSI   Critical care provider statement:    Critical care time (minutes):  33   Critical care time was exclusive of:  Separately billable procedures and  treating other patients   Critical care was necessary to treat or prevent imminent or  life-threatening deterioration of the following conditions:     Critical care was time spent personally by me on the following  activities:  Development of treatment plan with patient or surrogate,  discussions with consultants, evaluation of patient's response to  treatment, examination of patient, obtaining history from patient or  surrogate, ordering and performing  treatments and interventions, ordering  and review of laboratory studies and re-evaluation of patient's condition   I assumed direction of critical care for this patient from another  provider in my specialty: no      Ottie Glazier, M.D.  [facility] [place]

## 2019-06-27 NOTE — Progress Notes (Signed)
Pharmacy Electrolyte Monitoring Consult:  Pharmacy consulted to assist in monitoring and replacing electrolytes in this 74 y.o. male admitted on 06/26/2019 with septic shock and acute respiratory failure. Patient with past medical history significant for COPD, diastolic dysfunction, atrial fibrillation, diabetes, shingles, CAD, HTN, asthma, and arthritis.    Labs:  Sodium (mmol/L)  Date Value  06/27/2019 141  04/17/2019 140   Potassium (mmol/L)  Date Value  06/27/2019 4.5   Magnesium (mg/dL)  Date Value  06/27/2019 2.5 (H)   Phosphorus (mg/dL)  Date Value  06/27/2019 2.0 (L)   Calcium (mg/dL)  Date Value  06/27/2019 7.8 (L)   Albumin (g/dL)  Date Value  06/26/2019 3.3 (L)  12/06/2015 3.9    Assessment/Plan: No supplementation warranted.   Will obtain electrolytes with am labs.   Will replace for goal potassium ~ 4 and goal magnesium ~ 2.   All electrolytes with am labs.   SSI insulin increased to moderate. Continue to monitor. Steroids transitioned to prednisone in am.   Pharmacy will continue to monitor and adjust per consult.   Philip [name] L 06/27/2019 7:03 PM

## 2019-06-27 NOTE — Progress Notes (Signed)
OT Cancellation Note  Patient Details Name: NTHONY TRIPLETT Sr. MRN: [id number] DOB: [birth year]   Cancelled Treatment:    Reason Eval/Treat Not Completed: OT screened, no needs identified, will sign off  OT consult received and chart reviewed. When OT presents to unit, pt is up working with PT. OT observes pt performing fxl mobility at Supv level (to manage leads) with no AD with no LOB. Upon speaking with PT, there do not appear to be any acute OT needs at this time. Will complete order and sign off. Please re-consult for any change in status. Thank you.  Gerrianne Scale, [place], OTR/L ascom [phone] 06/27/19, 10:30 AM

## 2019-06-27 NOTE — Progress Notes (Signed)
eLink Physician-Brief Progress Note Patient Name: Philip [name] DOB: 1946/04/05 MRN: [id number]   Date of Service  06/27/2019  HPI/Events of Note  Pt admitted with septic shock secondary to lobar pneumonia, Pt has a history of COPD.  eICU Interventions  New Patient Evaluation completed.        Philip [name] [name] 06/27/2019, 1:50 AM

## 2019-06-27 NOTE — ED Notes (Addendum)
Patient resting in bed. States he still feels unwell but reports improvement since at home. Patient is spitting blood tinged sputum out of his mouth at this time. Patient denies pain. Patient is A&O x 4 and reporting tiredness

## 2019-06-28 ENCOUNTER — Inpatient Hospital Stay: Payer: Medicare Other

## 2019-06-28 DIAGNOSIS — Z888 Allergy status to other drugs, medicaments and biological substances status: Secondary | ICD-10-CM

## 2019-06-28 DIAGNOSIS — J449 Chronic obstructive pulmonary disease, unspecified: Secondary | ICD-10-CM

## 2019-06-28 DIAGNOSIS — I5030 Unspecified diastolic (congestive) heart failure: Secondary | ICD-10-CM

## 2019-06-28 DIAGNOSIS — I2510 Atherosclerotic heart disease of native coronary artery without angina pectoris: Secondary | ICD-10-CM

## 2019-06-28 DIAGNOSIS — A419 Sepsis, unspecified organism: Secondary | ICD-10-CM

## 2019-06-28 DIAGNOSIS — Z91041 Radiographic dye allergy status: Secondary | ICD-10-CM

## 2019-06-28 DIAGNOSIS — R0902 Hypoxemia: Secondary | ICD-10-CM

## 2019-06-28 DIAGNOSIS — R6521 Severe sepsis with septic shock: Secondary | ICD-10-CM

## 2019-06-28 DIAGNOSIS — Z951 Presence of aortocoronary bypass graft: Secondary | ICD-10-CM

## 2019-06-28 DIAGNOSIS — Z881 Allergy status to other antibiotic agents status: Secondary | ICD-10-CM

## 2019-06-28 DIAGNOSIS — Z880 Allergy status to penicillin: Secondary | ICD-10-CM

## 2019-06-28 DIAGNOSIS — R579 Shock, unspecified: Secondary | ICD-10-CM

## 2019-06-28 DIAGNOSIS — J189 Pneumonia, unspecified organism: Secondary | ICD-10-CM

## 2019-06-28 DIAGNOSIS — E119 Type 2 diabetes mellitus without complications: Secondary | ICD-10-CM

## 2019-06-28 DIAGNOSIS — Z87891 Personal history of nicotine dependence: Secondary | ICD-10-CM

## 2019-06-28 DIAGNOSIS — B9561 Methicillin susceptible Staphylococcus aureus infection as the cause of diseases classified elsewhere: Secondary | ICD-10-CM

## 2019-06-28 DIAGNOSIS — R7881 Bacteremia: Secondary | ICD-10-CM

## 2019-06-28 LAB — URINE CULTURE: Culture: NO GROWTH

## 2019-06-28 LAB — CBC
HCT: 36.6 % — ABNORMAL LOW (ref 39.0–52.0)
Hemoglobin: 12.6 g/dL — ABNORMAL LOW (ref 13.0–17.0)
MCH: 30.6 pg (ref 26.0–34.0)
MCHC: 34.4 g/dL (ref 30.0–36.0)
MCV: 88.8 fL (ref 80.0–100.0)
Platelets: 141 10*3/uL — ABNORMAL LOW (ref 150–400)
RBC: 4.12 MIL/uL — ABNORMAL LOW (ref 4.22–5.81)
RDW: 13.1 % (ref 11.5–15.5)
WBC: 12.8 10*3/uL — ABNORMAL HIGH (ref 4.0–10.5)
nRBC: 0 % (ref 0.0–0.2)

## 2019-06-28 LAB — BASIC METABOLIC PANEL
Anion gap: 6 (ref 5–15)
BUN: 32 mg/dL — ABNORMAL HIGH (ref 8–23)
CO2: 24 mmol/L (ref 22–32)
Calcium: 8.2 mg/dL — ABNORMAL LOW (ref 8.9–10.3)
Chloride: 109 mmol/L (ref 98–111)
Creatinine, Ser: 1.25 mg/dL — ABNORMAL HIGH (ref 0.61–1.24)
GFR calc Af Amer: >60 mL/min (ref >60)
GFR calc non Af Amer: 57 mL/min — ABNORMAL LOW (ref >60)
Glucose, Bld: 244 mg/dL — ABNORMAL HIGH (ref 70–99)
Potassium: 4.4 mmol/L (ref 3.5–5.1)
Sodium: 139 mmol/L (ref 135–145)

## 2019-06-28 LAB — GLUCOSE, CAPILLARY
Glucose-Capillary: 194 mg/dL — ABNORMAL HIGH (ref 70–99)
Glucose-Capillary: 286 mg/dL — ABNORMAL HIGH (ref 70–99)
Glucose-Capillary: 327 mg/dL — ABNORMAL HIGH (ref 70–99)
Glucose-Capillary: 237 mg/dL — ABNORMAL HIGH (ref 70–99)

## 2019-06-28 LAB — PROCALCITONIN: Procalcitonin: 18.23 ng/mL

## 2019-06-28 LAB — LEGIONELLA PNEUMOPHILA SEROGP 1 UR AG: L. pneumophila Serogp 1 Ur Ag: NEGATIVE

## 2019-06-28 LAB — MAGNESIUM: Magnesium: 2.4 mg/dL (ref 1.7–2.4)

## 2019-06-28 LAB — PHOSPHORUS: Phosphorus: 2.1 mg/dL — ABNORMAL LOW (ref 2.5–4.6)

## 2019-06-28 MED ORDER — ATORVASTATIN CALCIUM 20 MG PO TABS
40.0000 mg | ORAL_TABLET | Freq: Every day | ORAL | Status: DC
  Administered 2019-06-28 – 2019-07-05 (×8): 40 mg via ORAL
  Filled 2019-06-28: qty 2
  Filled 2019-06-28: qty 4
  Filled 2019-06-28 (×6): qty 2

## 2019-06-28 MED ORDER — MONTELUKAST SODIUM 10 MG PO TABS
10.0000 mg | ORAL_TABLET | Freq: Every day | ORAL | Status: DC
  Administered 2019-06-28 – 2019-07-04 (×7): 10 mg via ORAL
  Filled 2019-06-28 (×7): qty 1

## 2019-06-28 MED ORDER — GABAPENTIN 100 MG PO CAPS
100.0000 mg | ORAL_CAPSULE | Freq: Three times a day (TID) | ORAL | Status: DC
  Administered 2019-06-28 – 2019-07-05 (×21): 100 mg via ORAL
  Filled 2019-06-28 (×21): qty 1

## 2019-06-28 MED ORDER — LEVOTHYROXINE SODIUM 88 MCG PO TABS
88.0000 ug | ORAL_TABLET | Freq: Every day | ORAL | Status: DC
  Administered 2019-06-28 – 2019-07-05 (×8): 88 ug via ORAL
  Filled 2019-06-28 (×8): qty 1

## 2019-06-28 MED ORDER — K PHOS MONO-SOD PHOS DI & MONO 155-852-130 MG PO TABS
500.0000 mg | ORAL_TABLET | Freq: Two times a day (BID) | ORAL | Status: AC
  Administered 2019-06-28 – 2019-06-30 (×6): 500 mg via ORAL
  Filled 2019-06-28 (×6): qty 2

## 2019-06-28 MED ORDER — PREDNISONE 20 MG PO TABS
20.0000 mg | ORAL_TABLET | Freq: Every day | ORAL | Status: AC
  Administered 2019-06-29 – 2019-07-01 (×3): 20 mg via ORAL
  Filled 2019-06-28 (×3): qty 1

## 2019-06-28 MED ORDER — VANCOMYCIN HCL 1750 MG/350ML IV SOLN
1750.0000 mg | INTRAVENOUS | Status: DC
  Administered 2019-06-28: 12:00:00 1750 mg via INTRAVENOUS
  Filled 2019-06-28 (×2): qty 350

## 2019-06-28 MED ORDER — METOPROLOL TARTRATE 25 MG PO TABS
25.0000 mg | ORAL_TABLET | Freq: Two times a day (BID) | ORAL | Status: DC
  Administered 2019-06-28 – 2019-06-30 (×5): 25 mg via ORAL
  Filled 2019-06-28 (×5): qty 1

## 2019-06-28 NOTE — Progress Notes (Signed)
Pharmacy Electrolyte Monitoring Consult:  Pharmacy consulted to assist in monitoring and replacing electrolytes in this 74 y.o. male admitted on 06/26/2019 with septic shock and acute respiratory failure. Patient with past medical history significant for COPD, diastolic dysfunction, atrial fibrillation, diabetes, shingles, CAD, HTN, asthma, and arthritis.    Labs:  Sodium (mmol/L)  Date Value  06/28/2019 139  04/17/2019 140   Potassium (mmol/L)  Date Value  06/28/2019 4.4   Magnesium (mg/dL)  Date Value  06/28/2019 2.4   Phosphorus (mg/dL)  Date Value  06/28/2019 2.1 (L)   Calcium (mg/dL)  Date Value  06/28/2019 8.2 (L)   Albumin (g/dL)  Date Value  06/26/2019 3.3 (L)  12/06/2015 3.9    Assessment/Plan:  K Phos Neutral 2 tabs x 2 doses, which will provide 32 mmol phos total.  Will obtain electrolytes with am labs.   Will replace for goal potassium ~ 4 and goal magnesium ~ 2.    SSI insulin increased to moderate. Continue to monitor. Steroids transitioned to prednisone.  Pharmacy will continue to monitor and adjust per consult.   Tawnya Crook, PharmD 06/28/2019 8:37 AM

## 2019-06-28 NOTE — Progress Notes (Signed)
Pt performed IS @ 1600 cc & properly executed Flutter valve procedure.

## 2019-06-28 NOTE — Consult Note (Signed)
[facility]    REMOTE CONSULT   Reason for Consult: Staph aureus bacteremia   Referring Physician: CHAMP autoconsult  Active Problems:   COPD exacerbation ([place])   Shock ([place])   . apixaban  5 mg Oral BID  . budesonide (PULMICORT) nebulizer solution  0.5 mg Nebulization BID  . Chlorhexidine Gluconate Cloth  6 each Topical Daily  . famotidine  20 mg Oral BID  . guaiFENesin  600 mg Oral BID  . insulin aspart  0-15 Units Subcutaneous TID WC  . insulin aspart  0-5 Units Subcutaneous QHS  . ipratropium-albuterol  3 mL Nebulization TID  . phosphorus  500 mg Oral BID  . predniSONE  40 mg Oral Q breakfast  . Ensure Max Protein  11 oz Oral BID    Recommendations: Vancomycin Can use appropriate antibiotics (vancomycin or cefazolin) once BCID done +/- pneumonia coverage TTE Repeat blood cultures  Assessment: Staph aureus bacteremia in 1/4 bottles to date, possible pneumonia  Antibiotics: Ceftriaxone, azithromycin  HPI: Philip KINAS Sr. is a 74 y.o. male with COPD, diastolic CHF, CAD, hx CABG, DM came in with worsening wheezing, cough, sob hypoxia.  Requiring pressor support.  Blood culture as above.    Past Medical History:  Diagnosis Date  . Arthritis    a. right knee  . Asthma   . Atrial tachycardia ([place])   . 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 etiology.  [name] COPD (chronic obstructive pulmonary disease) ([place])   . Coronary artery disease    a. 05/2012 Cath: severe 3VD-->CABG x 4 by Dr. Servando Snare in 1/2014[place], SVG->LCX & SVG->RPDA; b. 06/2014 MV: no ischemia/infarct.  . Diastolic dysfunction    a. 06/2015 Echo: EF 50-55%, Gr1 DD. Mild MR. Mildly dil LA. Nl RV fxn. Nl PASP.  [name] Edema    LEGS/ FEET  . Essential hypertension   . History of bronchitis   . HOH (hard of hearing)   . Hypercholesteremia   . Neuropathy   . PAF (paroxysmal atrial fibrillation) (H[facility])    a. CHA2DS2VASc = 4-->Eliquis.  . Shingles   . Sleep apnea    NO CPAP   . Type II diabetes mellitus ([facility])     Social History   Tobacco Use  . Smoking status: Former Smoker    Packs/day: 1.00    Years: 8.00    Pack years: 8.00    Types: Cigarettes    Quit date: 12/31/1967    Years since quitting: 51.5  . Smokeless tobacco: Former Network engineer Use Topics  . Alcohol use: No    Alcohol/week: 0.0 standard drinks    Comment: 05/06/2012 "last alcohol several years ago; never had problem wit"  . Drug use: No    Family History  Problem Relation Age of Onset  . Heart disease Mother   . Clotting disorder Mother   . Hypertension Sister     Allergies  Allergen Reactions  . Bydureon [Exenatide] Anaphylaxis    Possible from bydureon, not 100% confirmed  . Clindamycin/Lincomycin Hives and Itching  . Penicillins Hives, Itching, Rash and Other (See Comments)    Has patient had a PCN reaction causing immediate rash, facial/tongue/throat swelling, SOB or lightheadedness with hypotension: Yes Has patient had a PCN reaction causing severe rash involving mucus membranes or skin necrosis: No Has patient had a PCN reaction that required hospitalization: No Has patient had a PCN reaction occurring within the last 10 years: No  If all of the above answers are "NO", then may proceed with Cephalosporin use.  . Sulfa Antibiotics Shortness Of Breath  . Spiriferis Other (See Comments)    Affected breathing  . Spiriva Handihaler [Tiotropium Bromide Monohydrate]     Affected breathing  . Ivp Dye [Iodinated Diagnostic Agents] Rash and Other (See Comments)    Redness    Physical Exam: Vitals:   06/28/19 0800 06/28/19 0838  BP:    Pulse: (!) 101   Resp: (!) 23   Temp: 98.1 F (36.7 C)   SpO2: 97% 96%    Lab Results  Component Value Date   WBC 12.8 (H) 06/28/2019   HGB 12.6 (L) 06/28/2019   HCT 36.6 (L) 06/28/2019   MCV 88.8 06/28/2019   PLT 141 (L) 06/28/2019    Lab Results  Component Value Date   CREATININE 1.25 (H) 06/28/2019   BUN 32 (H) 06/28/2019    NA 139 06/28/2019   K 4.4 06/28/2019   CL 109 06/28/2019   CO2 24 06/28/2019    Lab Results  Component Value Date   ALT 24 06/26/2019   AST 33 06/26/2019   ALKPHOS 80 06/26/2019     Microbiology: Recent Results (from the past 240 hour(s))  Blood Culture (routine x 2)     Status: None (Preliminary result)   Collection Time: 06/26/19  9:45 PM   Specimen: BLOOD  Result Value Ref Range Status   Specimen Description BLOOD LEFT HAND  Final   Special Requests   Final    BOTTLES DRAWN AEROBIC AND ANAEROBIC Blood Culture adequate volume   Culture   Final    NO GROWTH 2 DAYS Performed at [facility], [address] [place], [place] [address]    Report Status PENDING  Incomplete  Blood Culture (routine x 2)     Status: Abnormal (Preliminary result)   Collection Time: 06/26/19  9:58 PM   Specimen: BLOOD  Result Value Ref Range Status   Specimen Description   Final    BLOOD BLOOD RIGHT HAND Performed at [facility], [address] [place], [place] [address]    Special Requests   Final    BOTTLES DRAWN AEROBIC AND ANAEROBIC Blood Culture adequate volume Performed at [facility], [address] [place], [place] [address]    Culture  Setup Time   Final    GRAM POSITIVE COCCI AEROBIC BOTTLE ONLY CRITICAL RESULT CALLED TO, READ BACK BY AND VERIFIED WITH: Weber Cooks PATEL 06/27/19 @ 1400  [place] Performed at [facility], [address], [place], [place] [address]    Culture (A)  Final    STAPHYLOCOCCUS AUREUS SUSCEPTIBILITIES TO FOLLOW Performed at [facility], [place] [address], [place], [place] [address]    Report Status PENDING  Incomplete  Respiratory Panel by RT PCR (Flu A&B, Covid) - Nasopharyngeal Swab     Status: None   Collection Time: 06/26/19 10:49 PM   Specimen: Nasopharyngeal Swab  Result Value Ref Range Status   SARS Coronavirus 2 by RT PCR NEGATIVE NEGATIVE Final    Comment: (NOTE) SARS-CoV-2 target nucleic acids are  NOT DETECTED. The SARS-CoV-2 RNA is generally detectable in upper respiratoy specimens during the acute phase of infection. The lowest concentration of SARS-CoV-2 viral copies this assay can detect is 131 copies/mL. A negative result does not preclude SARS-Cov-2 infection and should not be used as the sole basis for treatment or other patient management decisions. A negative result may occur  with  improper specimen collection/handling, submission of specimen other than nasopharyngeal swab, presence of viral mutation(s) within the areas targeted by this assay, and inadequate number of viral copies (<131 copies/mL). A negative result must be combined with clinical observations, patient history, and epidemiological information. The expected result is Negative. Fact Sheet for Patients:  PinkCheek.be Fact Sheet for Healthcare Providers:  GravelBags.it This test is not yet ap proved or cleared by the [place] FDA and  has been authorized for detection and/or diagnosis of SARS-CoV-2 by FDA under an Emergency Use Authorization (EUA). This EUA will remain  in effect (meaning this test can be used) for the duration of the COVID-19 declaration under Section 564(b)(1) of the Act, 21 U.S.C. section 360bbb-3(b)(1), unless the authorization is terminated or revoked sooner.    Influenza A by PCR NEGATIVE NEGATIVE Final   Influenza B by PCR NEGATIVE NEGATIVE Final    Comment: (NOTE) The Xpert Xpress SARS-CoV-2/FLU/RSV assay is intended as an aid in  the diagnosis of influenza from Nasopharyngeal swab specimens and  should not be used as a sole basis for treatment. Nasal washings and  aspirates are unacceptable for Xpert Xpress SARS-CoV-2/FLU/RSV  testing. Fact Sheet for Patients: PinkCheek.be Fact Sheet for Healthcare Providers: GravelBags.it This test is not yet approved or  cleared by the [place] FDA and  has been authorized for detection and/or diagnosis of SARS-CoV-2 by  FDA under an Emergency Use Authorization (EUA). This EUA will remain  in effect (meaning this test can be used) for the duration of the  Covid-19 declaration under Section 564(b)(1) of the Act, 21  U.S.C. section 360bbb-3(b)(1), unless the authorization is  terminated or revoked. Performed at [facility], [address] [place], [place] [address]   Culture, sputum-assessment     Status: None   Collection Time: 06/27/19  1:30 AM   Specimen: Expectorated Sputum  Result Value Ref Range Status   Specimen Description EXPECTORATED SPUTUM  Final   Special Requests NONE  Final   Sputum evaluation   Final    THIS SPECIMEN IS ACCEPTABLE FOR SPUTUM CULTURE Performed at [facility], [address] [place], [place] [address]    Report Status 06/27/2019 FINAL  Final  MRSA PCR Screening     Status: None   Collection Time: 06/27/19  1:30 AM   Specimen: Nasal Mucosa; Nasopharyngeal  Result Value Ref Range Status   MRSA by PCR NEGATIVE NEGATIVE Final    Comment:        The GeneXpert MRSA Assay (FDA approved for NASAL specimens only), is one component of a comprehensive MRSA colonization surveillance program. It is not intended to diagnose MRSA infection nor to guide or monitor treatment for MRSA infections. Performed at [facility], [address] [place], [place] [address]   Culture, respiratory     Status: None (Preliminary result)   Collection Time: 06/27/19  1:30 AM  Result Value Ref Range Status   Specimen Description   Final    EXPECTORATED SPUTUM Performed at [facility], [address] [place], [place] [address]    Special Requests   Final    NONE Reflexed from [id number] Performed at [facility], [place], [place] [address]    Gram Stain   Final    ABUNDANT WBC PRESENT,BOTH PMN AND  MONONUCLEAR MODERATE GRAM POSITIVE COCCI FEW GRAM POSITIVE RODS RARE GRAM NEGATIVE RODS RARE YEAST MODERATE SQUAMOUS EPITHELIAL CELLS PRESENT Performed at [facility],  [address], [place], [place] [address]    Culture PENDING  Incomplete   Report Status PENDING  Incomplete  Group A Strep by PCR     Status: None   Collection Time: 06/27/19  2:11 AM   Specimen: Throat; Sterile Swab  Result Value Ref Range Status   Group A Strep by PCR NOT DETECTED NOT DETECTED Final    Comment: Performed at [facility], [address] [place], [place] [address]    Ulonda Klosowski [address], [place] [email or web address] 06/28/2019, 8:56 AM

## 2019-06-28 NOTE — Progress Notes (Signed)
Name: Philip SIEFERT Sr. MRN: [id number] DOB: Jul 05, 1945    ADMISSION DATE:  06/26/2019 CONSULTATION DATE: 06/27/2019  REFERRING MD : Dr. Sidney Ace   CHIEF COMPLAINT: Shortness of Breath   BRIEF PATIENT DESCRIPTION:  74 yo male admitted with septic shock and acute on chronic respiratory failure secondary to suspected CAP and AECOPD requiring levophed gtt   SIGNIFICANT EVENTS/STUDIES:  02/25: Pt admitted to the medsurg unit  02/26: While awaiting medsurg availability pt developed hypotension requiring levophed gtt and transfer to ICU.  PCCM team consulted to assist with management 2/27- patient with Staph aureaus bacteremia  HISTORY OF PRESENT ILLNESS:   This is a 74 yo male with a PMH of Type II Diabetes Mellitus, OSA (does not wear CPAP), Shingles, Paroxymal Atrial Fibrillation on Eliquis, Neuropathy, Hypercholesteremia, HOH, Bronchitis, Chronic Diastolic CHF (EF 78-29%), CABG, CAD, COPD, Atrial Tachycardia, Asthma, and Arthritis.  He presented to [name] on 02/25 via EMS with shortness of breath onset 02/25.  He also endorsed chills, shortness of breath, and a fever. He also states he returned to work this week (he works for [name]). Upon EMS arrival he received solumedrol and duoneb's x2.  It was also noted cardiac rhythm atrial fibrillation with rvr hr 140's bpm.  In the [name] lab results revealed K+ 3.2, CO2 21, glucose 194, BUN 24, creatinine 1.44, lactic acid 2.6, pct 7.19, and BNP 583.  Influenza PCR/COVID-19 negative, however CXR concerning mild vascular congestion and LLL consolidation vs. atelectasis. Vital signs: temp 100 F, bp 114/66, hr 130's, rr 24, and O2 sats 94%.  Sepsis protocol initiated pt received 1L LR bolus, 2L NS bolus, cefepime, solumedrol, azithromycin, and duoneb treatments.  Pt initially admitted to the medsurg unit per hospitalist team for additional workup and treatment.  However, he later developed hypotension requiring levophed gtt and transfer to ICU.   PCCM team consulted to assist with management.   PAST MEDICAL HISTORY :   has a past medical history of Arthritis, Asthma, Atrial tachycardia ([place]), Collapse of right lung, COPD (chronic obstructive pulmonary disease) ([place]), Coronary artery disease, Diastolic dysfunction, Edema, Essential hypertension, History of bronchitis, HOH (hard of hearing), Hypercholesteremia, Neuropathy, PAF (paroxysmal atrial fibrillation) ([place]), Shingles, Sleep apnea, and Type II diabetes mellitus ([name]).  has a past surgical history that includes Knee surgery (2003); Neck surgery (2008); Knee arthroscopy (~ 2000); Anterior cervical decomp/discectomy fusion (~ 2009); Intraoprative transesophageal echocardiogram (05/07/2012); Coronary artery bypass graft (05/07/2012); Cardiac catheterization (05/06/2012); Coronary angioplasty; Cataract extraction w/PHACO (Right, 08/15/2017); Back surgery; and Cataract extraction w/PHACO (Left, 10/03/2017). Prior to Admission medications   Medication Sig Start Date End Date Taking? Authorizing Provider  albuterol (PROVENTIL) (2.5 MG/3ML) 0.083% nebulizer solution Inhale 3 mLs into the lungs every 6 (six) hours as needed for wheezing or shortness of breath.  03/13/16  Yes [provider]  Albuterol Sulfate 108 (90 Base) MCG/ACT AEPB Inhale 2 puffs into the lungs every 6 (six) hours as needed (shortness of breath).    Yes [provider]  Artificial Tear Ointment (DRY EYES OP) Place 1 drop into both eyes daily as needed (for dry eyes).   Yes [provider]  atorvastatin (LIPITOR) 40 MG tablet Take 1 tablet by mouth once daily 09/30/18  Yes [name], MD  cetirizine (ZYRTEC) 10 MG tablet Take 10 mg by mouth daily.    Yes [provider]  diclofenac sodium (VOLTAREN) 1 % GEL Apply 2 g topically 3 (three) times daily as needed (Use  on elbow bursitis and knee). 07/04/17  Yes [name], [name], DO  ELIQUIS 5 MG TABS tablet Take 1 tablet by mouth twice daily 11/27/18   Yes [name], MD  fluticasone (FLONASE) 50 MCG/ACT nasal spray Place 1 spray into both nostrils daily as needed for allergies or rhinitis.   Yes [provider]  furosemide (LASIX) 40 MG tablet TAKE 1 TABLET BY MOUTH ONCE DAILY. APPOINTMENT NEEDED FOR FURTHER REFILLS. Patient taking differently: Every other day 12/31/18  Yes Dunn, Areta Haber, [name]  gabapentin (NEURONTIN) 100 MG capsule TAKE 1 CAPSULE BY MOUTH THREE TIMES DAILY 05/22/19  Yes [name], [name], DO  ipratropium-albuterol (DUONEB) 0.5-2.5 (3) MG/3ML SOLN Take 3 mLs by nebulization 4 (four) times daily. 02/12/19  Yes [provider]  levothyroxine (SYNTHROID) 88 MCG tablet Take 1 tablet by mouth once daily 03/16/19  Yes [name], [name], DO  losartan (COZAAR) 25 MG tablet Take 1 tablet by mouth once daily 03/31/19  Yes Arida, Mertie Clause, MD  metFORMIN (GLUCOPHAGE) 500 MG tablet TAKE 2 TABLETS BY MOUTH TWICE DAILY WITH A MEAL Patient taking differently: Take 1,000 mg by mouth 2 (two) times daily with a meal.  05/12/19  Yes [name], [name], DO  metoprolol tartrate (LOPRESSOR) 25 MG tablet TAKE 1 TABLET BY MOUTH TWICE DAILY . APPOINTMENT REQUIRED FOR FUTURE REFILLS Patient taking differently: Take 25 mg by mouth 2 (two) times daily.  05/22/19  Yes [name], MD  montelukast (SINGULAIR) 10 MG tablet Take 10 mg by mouth at bedtime.  02/08/15  Yes [provider]  predniSONE (DELTASONE) 5 MG tablet Take 5 mg by mouth every other day.  09/04/17  Yes [provider]  SYMBICORT 160-4.5 MCG/ACT inhaler Inhale 2 puffs 2 (two) times daily into the lungs.  08/11/16  Yes [provider]  Blood Glucose Monitoring Suppl (ONE TOUCH ULTRA 2) w/Device KIT Use to check blood sugar as advised up to twice a day 03/10/19   [name], [name], DO  guaiFENesin (MUCINEX) 600 MG 12 hr tablet Take 600 mg by mouth 2 (two) times daily as needed.    [provider]  LANCETS ULTRA  THIN MISC 1 Device by Does not apply route 4 (four) times daily -  before meals and at bedtime. 05/17/12   [name], MD  [facility] ULTRA test strip USE TEST STRIP(S) TO CHECK GLUCOSE TWICE DAILY 12/10/18   [name], DO   Allergies  Allergen Reactions  . Bydureon [Exenatide] Anaphylaxis    Possible from bydureon, not 100% confirmed  . Clindamycin/Lincomycin Hives and Itching  . Penicillins Hives, Itching, Rash and Other (See Comments)    Has patient had a PCN reaction causing immediate rash, facial/tongue/throat swelling, SOB or lightheadedness with hypotension: Yes Has patient had a PCN reaction causing severe rash involving mucus membranes or skin necrosis: No Has patient had a PCN reaction that required hospitalization: No Has patient had a PCN reaction occurring within the last 10 years: No If all of the above answers are "NO", then may proceed with Cephalosporin use.  . Sulfa Antibiotics Shortness Of Breath  . Spiriferis Other (See Comments)    Affected breathing  . Spiriva Handihaler [Tiotropium Bromide Monohydrate]     Affected breathing  . Ivp Dye [Iodinated Diagnostic Agents] Rash and Other (See Comments)    Redness    FAMILY HISTORY:  family history includes Clotting disorder in his mother; Heart disease in his mother; Hypertension in his sister.  SOCIAL HISTORY:  reports that he quit smoking about 51 years ago. His smoking use included cigarettes. He has a 8.00 pack-year smoking history. He has quit using smokeless tobacco. He reports that he does not drink alcohol or use drugs.  REVIEW OF SYSTEMS: Positives in BOLD  Constitutional: fever, chills, weight loss, malaise/fatigue and diaphoresis.  HENT: hearing loss, ear pain, nosebleeds, congestion, sore throat, neck pain, tinnitus and ear discharge.   Eyes: Negative for blurred vision, double vision, photophobia, pain, discharge and redness.  Respiratory: cough, hemoptysis, sputum production, shortness of  breath, wheezing and stridor.   Cardiovascular: Negative for chest pain, palpitations, orthopnea, claudication, leg swelling and PND.  Gastrointestinal: Negative for heartburn, nausea, vomiting, abdominal pain, diarrhea, constipation, blood in stool and melena.  Genitourinary: Negative for dysuria, urgency, frequency, hematuria and flank pain.  Musculoskeletal: Negative for myalgias, back pain, joint pain and falls.  Skin: Negative for itching and rash.  Neurological: Negative for dizziness, tingling, tremors, sensory change, speech change, focal weakness, seizures, loss of consciousness, weakness and headaches.  Endo/Heme/Allergies: Negative for environmental allergies and polydipsia. Does not bruise/bleed easily.  SUBJECTIVE:   VITAL SIGNS: Temp:  [97.7 F (36.5 C)-98.3 F (36.8 C)] 98.1 F (36.7 C) (02/27 0800) Pulse Rate:  [76-131] 94 (02/27 0900) Resp:  [13-33] 33 (02/27 0900) BP: (77-154)/(55-104) 119/81 (02/27 0900) SpO2:  [93 %-100 %] 97 % (02/27 0900) Weight:  [96.6 kg] 96.6 kg (02/27 0500)  PHYSICAL EXAMINATION: General: acutely ill appearing male, NAD resting in bed  Neuro: alert and oriented, follows commands  HEENT: voice hoarseness, no JVD  Cardiovascular: irregular irregular, no R/G Lungs: expiratory wheezes with crackles throughout, even, non labored  Abdomen: +BS x4, soft, obese, non tender, non distended  Musculoskeletal: normal bulk and tone, no edema  Skin: intact no rashes or lesions present   Recent Labs  Lab 06/26/19 2224 06/27/19 0508 06/28/19 0425  NA 140 141 139  K 3.2* 4.5 4.4  CL 110 111 109  CO2 21* 19* 24  BUN 24* 26* 32*  CREATININE 1.44* 1.52* 1.25*  GLUCOSE 194* 255* 244*   Recent Labs  Lab 06/26/19 2145 06/27/19 0508 06/28/19 0425  HGB 13.9 13.5 12.6*  HCT 40.4 39.6 36.6*  WBC 4.0 17.4* 12.8*  PLT 162 216 141*   DG Chest Port 1 View  Result Date: 06/26/2019 CLINICAL DATA:  Short of breath, tachycardia EXAM: PORTABLE CHEST 1  VIEW COMPARISON:  05/08/2016 FINDINGS: Single frontal view of the chest demonstrates mild enlargement the cardiac silhouette. 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 There is mild central vascular congestion. Left lower lobe consolidation is noted. No effusion or pneumothorax. IMPRESSION: 1. Left lower lobe consolidation which may reflect atelectasis or airspace disease. 2. Mild central vascular congestion. Electronically Signed   By: Randa Ngo M.D.   On: 06/26/2019 22:02      ASSESSMENT / PLAN:  Septic shock -present on admission  -due to staph aureus bacteremia with community aquired pneumonia -on empiric antibiotics -patient empirically on Zithromax, Rocephin and vancomycin I will narrowed to vancomycin only today. -Patient is now off of vasopressor support -Appreciate infectious disease and hospitalist collaboration   Moderate persistent asthma with COPD overlap and acute exacerbation  -Continue with COPD care path  -Careful with DuoNeb's due to A. fib RVR  -Recommend Pulmicort only at this time -Status post IV Solu-Medrol transition to p.o. prednisone decreasing steroids to 20 mg of prednisone p.o. 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  -Due to septic nephropathy with transient hypotension while in shock  -DC nephrotoxic medications that are nonessential  -Correct electrolyte derangements   Type II diabetes mellitus  CBG ac/hs SSI   Critical care provider statement:    Critical care time (minutes):  33   Critical care time was exclusive of:  Separately billable procedures and  treating other patients   Critical care was necessary to treat  or prevent imminent or  life-threatening deterioration of the following conditions:     Critical care was time spent personally by me on the following  activities:  Development of treatment plan with patient or surrogate,  discussions with consultants, evaluation of patient's response to  treatment, examination of patient, obtaining history from patient or  surrogate, ordering and performing treatments and interventions, ordering  and review of laboratory studies and re-evaluation of patient's condition   I assumed direction of critical care for this patient from another  provider in my specialty: no      Ottie Glazier, M.D.  Pulmonary & [place]

## 2019-06-28 NOTE — Consult Note (Signed)
Pharmacy Antibiotic Note  Philip [name]. is a 74 y.o. male admitted on 06/26/2019 with CAP with sepsis and septic shock, COPD exacerbation, AKI, A.fib. WBC trending up 4.0 >> 17.4, procal trending up 7.19 >> 26.8, afebrile. Pt was on azithromycin/cefepime for sepsis. Pt is now on azithromycin/ceftriaxone. Pharmacy has been consulted for vancomycin dosing.  Blood culture with staph aureus.  Plan: Increase vancomycin to 1750 mg q24h for improvement in renal function  Scr 1.25 AUC 487 Cmin 11.4  Height: 5\' 11"  (180.3 cm) Weight: 212 lb 15.4 oz (96.6 kg) IBW/kg (Calculated) : 75.3  Temp (24hrs), Avg:98.1 F (36.7 C), Min:97.7 F (36.5 C), Max:98.3 F (36.8 C)  Recent Labs  Lab 06/26/19 2145 06/26/19 2224 06/26/19 2336 06/27/19 0150 06/27/19 0508 06/28/19 0425  WBC 4.0  --   --   --  17.4* 12.8*  CREATININE  --  1.44*  --   --  1.52* 1.25*  LATICACIDVEN  --  2.6* 2.9* 3.8*  --   --     Estimated Creatinine Clearance: 63.3 mL/min (A) (by C-G formula based on SCr of 1.25 mg/dL (H)).    Allergies  Allergen Reactions  . Bydureon [Exenatide] Anaphylaxis    Possible from bydureon, not 100% confirmed  . Clindamycin/Lincomycin Hives and Itching  . Penicillins Hives, Itching, Rash and Other (See Comments)    Has patient had a PCN reaction causing immediate rash, facial/tongue/throat swelling, SOB or lightheadedness with hypotension: Yes Has patient had a PCN reaction causing severe rash involving mucus membranes or skin necrosis: No Has patient had a PCN reaction that required hospitalization: No Has patient had a PCN reaction occurring within the last 10 years: No If all of the above answers are "NO", then may proceed with Cephalosporin use.  . Sulfa Antibiotics Shortness Of Breath  . Spiriferis Other (See Comments)    Affected breathing  . Spiriva Handihaler [Tiotropium Bromide Monohydrate]     Affected breathing  . Ivp Dye [Iodinated Diagnostic Agents] Rash and Other (See  Comments)    Redness    Antimicrobials this admission: Azithromycin 2/25 >>  Cefepime 2/25 >> 2/26 Ceftriaxone 2/26 >>  Vancomycin 2/26 >>  Dose adjustments this admission: 2/27 vanc 1500 mg q24h > 1750 mg q24h  Microbiology results: 2/25 BCx: staph aureus (susceptibilities pending) 2/26 UCx: pending  2/26 MRSA PCR: NEG COVID/Influenza A/B NEG  Thank you for allowing pharmacy to be a part of this patient's care.  Tawnya Crook, PharmD 06/28/2019 8:44 AM

## 2019-06-28 NOTE — Progress Notes (Signed)
PROGRESS NOTE    Philip Potters Sr.  HC:[id number] DOB: 12-21-1945 DOA: 06/26/2019 PCP: [name], DO   Brief Narrative:  This is a 74 yo male with a PMH of Type II Diabetes Mellitus, OSA (does not wear CPAP), Shingles, Paroxymal Atrial Fibrillation on Eliquis, Neuropathy, Hypercholesteremia, HOH, Bronchitis, Chronic Diastolic CHF (EF [id number]), CABG, CAD, COPD, Atrial Tachycardia, Asthma, and Arthritis.  He presented to [facility] ER on 02/25 via EMS with shortness of breath onset 02/25.  He also endorsed chills, shortness of breath, and a fever. He also states he returned to work this week (he works for [employer]). Upon EMS arrival he received solumedrol and duoneb's x2.  It was also noted cardiac rhythm atrial fibrillation with rvr hr 140's bpm. Influenza PCR/COVID-19 negative, however CXR concerning mild vascular congestion and LLL consolidation vs. Atelectasis. Sepsis protocol initiated pt received 1L LR bolus, 2L NS bolus, cefepime, solumedrol, azithromycin, and duoneb treatments.  Pt initially admitted to the medsurg unit per hospitalist team for additional workup and treatment.  However, he later developed hypotension requiring levophed gtt and transfer to ICU. Transferred out of ICU on 06/28/2019.  Blood pressure stable without levo.  Aerobic blood culture bottles are growing gram-positive cocci and vancomycin was added to ceftriaxone and azithromycin.  Subjective: Patient was still experiencing some shortness of breath, better than before.  Still not at his baseline.  Remained afebrile.  Assessment & Plan:   Active Problems:   COPD exacerbation ([place])   Shock ([place])  Acute on chronic respiratory failure secondary to CAP and AECOPD  Hx: Bronchitis and Asthma.  Improving.  He was saturating well on room air.  No wheezing.  He was given higher doses of IV Solu-Medrol with breathing treatment. Pulmonary is following-appreciate their recommendations. -Start him on  Pulmicort. -Transitioned IV Solu-Medrol to p.o. prednisone. -Continue to monitor.  Hypotension secondary to septic shock due to staph aureus bacteremia. resolved.  Patient was normotensive and off the pressors for almost 15 hours. -Blood cultures growing staph aureus-susceptibility to follow. -Narrow antibiotics to vancomycin only-we will de-escalate once susceptibility results available. -Get echocardiogram to rule out endocarditis. -Repeat blood cultures tomorrow.  Atrial fibrillation with RVR.  Heart rate remained elevated in low 100s. -Restart home dose of beta-blocker. -Continue home dose of Eliquis.  Chronic diastolic CHF, CAD, Atrial Tachycardia, hypercholesterolemia. -Mild lower extremity edema. -Repeat chest x-ray. -Restart home dose of ACE inhibitor if blood pressure allows. -Can restart home Lasix if needed. -Restart home dose of Lipitor.  Essential hypertension.  Currently normotensive without any pressor. -Restart home dose of beta-blocker. -Can restart home dose of ACE inhibitor if blood pressure allows.  Acute on chronic renal failure.  Secondary to sepsis, hypotension and lactic acidosis.  Improving.  Creatinine at 1.25 today around 1.1.  Type II diabetes mellitus.  Uncontrolled with A1c of 7.9.  CBG elevated as patient is on steroid. -Continue to monitor-anticipated improvement as steroid dose has been decreased. -Continue SSI  Objective: Vitals:   06/28/19 0300 06/28/19 0400 06/28/19 0500 06/28/19 0600  BP: 111/76 117/68 (!) 77/62 117/77  Pulse: 90 88 (!) 106 (!) 104  Resp: 20 13 18 16   Temp:  98.2 F (36.8 C)    TempSrc:  Oral    SpO2: 96% 97% 95% 96%  Weight:   96.6 kg   Height:        Intake/Output Summary (Last 24 hours) at 06/28/2019 0724 Last data filed at 06/28/2019 0400 Gross per 24 hour  Intake 1781.34 ml  Output 1075 ml  Net 706.34 ml   Filed Weights   06/26/19 2140 06/27/19 0125 06/28/19 0500  Weight: 93.9 kg 96.6 kg 96.6 kg     Examination:  General exam: Appears calm and comfortable  Respiratory system: Scattered crackles bilaterally. Respiratory effort normal. Cardiovascular system: Irregularly irregular, no JVD, murmurs, rubs, gallops or clicks. Gastrointestinal system: Soft, nontender, nondistended, bowel sounds positive. Central nervous system: Alert and oriented. No focal neurological deficits.Symmetric 5 x 5 power. Extremities: Trace LE edema, no cyanosis, pulses intact and symmetrical. Skin: No rashes, lesions or ulcers Psychiatry: Judgement and insight appear normal. Mood & affect appropriate.    DVT prophylaxis: Eliquis Code Status: Full Family Communication: No family at bedside Disposition Plan: Pending improvement.  Most likely will go back home.  Consultants:   PCCM  ID  Procedures:   Antimicrobials:  Vancomycin  Data Reviewed: I have personally reviewed following labs and imaging studies  CBC: Recent Labs  Lab 06/26/19 2145 06/27/19 0508 06/28/19 0425  WBC 4.0 17.4* 12.8*  NEUTROABS 2.9 14.9*  --   HGB 13.9 13.5 12.6*  HCT 40.4 39.6 36.6*  MCV 89.0 91.0 88.8  PLT 162 216 [id number]*   Basic Metabolic Panel: Recent Labs  Lab 06/26/19 2224 06/27/19 0150 06/27/19 0508 06/27/19 0739 06/28/19 0425  NA 140  --  141  --  139  K 3.2*  --  4.5  --  4.4  CL 110  --  111  --  109  CO2 21*  --  19*  --  24  GLUCOSE 194*  --  255*  --  244*  BUN 24*  --  26*  --  32*  CREATININE 1.44*  --  1.52*  --  1.25*  CALCIUM 7.9*  --  7.8*  --  8.2*  MG  --  1.0*  --  2.5* 2.4  PHOS  --   --  2.0*  --  2.1*   GFR: Estimated Creatinine Clearance: 63.3 mL/min (A) (by C-G formula based on SCr of 1.25 mg/dL (H)). Liver Function Tests: Recent Labs  Lab 06/26/19 2224  AST 33  ALT 24  ALKPHOS 80  BILITOT 1.0  PROT 4.9*  ALBUMIN 3.3*   No results for input(s): LIPASE, AMYLASE in the last 168 hours. No results for input(s): AMMONIA in the last 168 hours. Coagulation  Profile: Recent Labs  Lab 06/26/19 2145  INR 1.6*   Cardiac Enzymes: No results for input(s): CKTOTAL, CKMB, CKMBINDEX, TROPONINI in the last 168 hours. BNP (last 3 results) No results for input(s): PROBNP in the last 8760 hours. HbA1C: Recent Labs    06/27/19 0150  HGBA1C 7.9*   CBG: Recent Labs  Lab 06/27/19 0329 06/27/19 0737 06/27/19 1128 06/27/19 1557 06/27/19 2141  GLUCAP 257* 257* 327* 361* 332*   Lipid Profile: No results for input(s): CHOL, HDL, LDLCALC, TRIG, CHOLHDL, LDLDIRECT in the last 72 hours. Thyroid Function Tests: No results for input(s): TSH, T4TOTAL, FREET4, T3FREE, THYROIDAB in the last 72 hours. Anemia Panel: No results for input(s): VITAMINB12, FOLATE, FERRITIN, TIBC, IRON, RETICCTPCT in the last 72 hours. Sepsis Labs: Recent Labs  Lab 06/26/19 2224 06/26/19 2336 06/27/19 0150 06/27/19 0508 06/28/19 0425  PROCALCITON 7.19  --  22.84 26.84 18.23  LATICACIDVEN 2.6* 2.9* 3.8*  --   --     Recent Results (from the past 240 hour(s))  Blood Culture (routine x 2)     Status: None (Preliminary result)  Collection Time: 06/26/19  9:45 PM   Specimen: BLOOD  Result Value Ref Range Status   Specimen Description BLOOD LEFT HAND  Final   Special Requests   Final    BOTTLES DRAWN AEROBIC AND ANAEROBIC Blood Culture adequate volume   Culture   Final    NO GROWTH 2 DAYS Performed at [facility], [address] [place], [place] [address]    Report Status PENDING  Incomplete  Blood Culture (routine x 2)     Status: None (Preliminary result)   Collection Time: 06/26/19  9:58 PM   Specimen: BLOOD  Result Value Ref Range Status   Specimen Description BLOOD BLOOD RIGHT HAND  Final   Special Requests   Final    BOTTLES DRAWN AEROBIC AND ANAEROBIC Blood Culture adequate volume   Culture  Setup Time   Final    GRAM POSITIVE COCCI AEROBIC BOTTLE ONLY CRITICAL RESULT CALLED TO, READ BACK BY AND VERIFIED WITH: KEYSHAWN PATEL 06/27/19 @ 1400   [place] Performed at [facility], [address] [place], [place] [address]    Culture GRAM POSITIVE COCCI  Final   Report Status PENDING  Incomplete  Respiratory Panel by RT PCR (Flu A&B, Covid) - Nasopharyngeal Swab     Status: None   Collection Time: 06/26/19 10:49 PM   Specimen: Nasopharyngeal Swab  Result Value Ref Range Status   SARS Coronavirus 2 by RT PCR NEGATIVE NEGATIVE Final    Comment: (NOTE) SARS-CoV-2 target nucleic acids are NOT DETECTED. The SARS-CoV-2 RNA is generally detectable in upper respiratoy specimens during the acute phase of infection. The lowest concentration of SARS-CoV-2 viral copies this assay can detect is 131 copies/mL. A negative result does not preclude SARS-Cov-2 infection and should not be used as the sole basis for treatment or other patient management decisions. A negative result may occur with  improper specimen collection/handling, submission of specimen other than nasopharyngeal swab, presence of viral mutation(s) within the areas targeted by this assay, and inadequate number of viral copies (<131 copies/mL). A negative result must be combined with clinical observations, patient history, and epidemiological information. The expected result is Negative. Fact Sheet for Patients:  PinkCheek.be Fact Sheet for Healthcare Providers:  GravelBags.it This test is not yet ap proved or cleared by the [place] FDA and  has been authorized for detection and/or diagnosis of SARS-CoV-2 by FDA under an Emergency Use Authorization (EUA). This EUA will remain  in effect (meaning this test can be used) for the duration of the COVID-19 declaration under Section 564(b)(1) of the Act, 21 U.S.C. section 360bbb-3(b)(1), unless the authorization is terminated or revoked sooner.    Influenza A by PCR NEGATIVE NEGATIVE Final   Influenza B by PCR NEGATIVE NEGATIVE Final    Comment: (NOTE) The  Xpert Xpress SARS-CoV-2/FLU/RSV assay is intended as an aid in  the diagnosis of influenza from Nasopharyngeal swab specimens and  should not be used as a sole basis for treatment. Nasal washings and  aspirates are unacceptable for Xpert Xpress SARS-CoV-2/FLU/RSV  testing. Fact Sheet for Patients: PinkCheek.be Fact Sheet for Healthcare Providers: GravelBags.it This test is not yet approved or cleared by the [place] FDA and  has been authorized for detection and/or diagnosis of SARS-CoV-2 by  FDA under an Emergency Use Authorization (EUA). This EUA will remain  in effect (meaning this test can be used) for the duration of the  Covid-19 declaration under Section 564(b)(1) of the Act, 21  U.S.C. section 360bbb-3(b)(1),  unless the authorization is  terminated or revoked. Performed at [facility], [address] [place], [place] [address]   Culture, sputum-assessment     Status: None   Collection Time: 06/27/19  1:30 AM   Specimen: Expectorated Sputum  Result Value Ref Range Status   Specimen Description EXPECTORATED SPUTUM  Final   Special Requests NONE  Final   Sputum evaluation   Final    THIS SPECIMEN IS ACCEPTABLE FOR SPUTUM CULTURE Performed at [facility], [address] [place], [place] [address]    Report Status 06/27/2019 FINAL  Final  MRSA PCR Screening     Status: None   Collection Time: 06/27/19  1:30 AM   Specimen: Nasal Mucosa; Nasopharyngeal  Result Value Ref Range Status   MRSA by PCR NEGATIVE NEGATIVE Final    Comment:        The GeneXpert MRSA Assay (FDA approved for NASAL specimens only), is one component of a comprehensive MRSA colonization surveillance program. It is not intended to diagnose MRSA infection nor to guide or monitor treatment for MRSA infections. Performed at [facility], [address] [place], [place] [address]   Culture, respiratory      Status: None (Preliminary result)   Collection Time: 06/27/19  1:30 AM  Result Value Ref Range Status   Specimen Description   Final    EXPECTORATED SPUTUM Performed at [facility], [address] [place], [place] [address]    Special Requests   Final    NONE Reflexed from [id number] Performed at [facility], [place], [place] [address]    Gram Stain   Final    ABUNDANT WBC PRESENT,BOTH PMN AND MONONUCLEAR MODERATE GRAM POSITIVE COCCI FEW GRAM POSITIVE RODS RARE GRAM NEGATIVE RODS RARE YEAST MODERATE SQUAMOUS EPITHELIAL CELLS PRESENT Performed at [facility], [place] [address], [place], [place] [address]    Culture PENDING  Incomplete   Report Status PENDING  Incomplete  Group A Strep by PCR     Status: None   Collection Time: 06/27/19  2:11 AM   Specimen: Throat; Sterile Swab  Result Value Ref Range Status   Group A Strep by PCR NOT DETECTED NOT DETECTED Final    Comment: Performed at [facility], [address] [place], [place] [address]     Radiology Studies: DG Chest Port 1 View  Result Date: 06/26/2019 CLINICAL DATA:  Short of breath, tachycardia EXAM: PORTABLE CHEST 1 VIEW COMPARISON:  05/08/2016 FINDINGS: Single frontal view of the chest demonstrates mild enlargement the cardiac silhouette. 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 There is mild central vascular congestion. Left lower lobe consolidation is noted. No effusion or pneumothorax. IMPRESSION: 1. Left lower lobe consolidation which may reflect atelectasis or airspace disease. 2. Mild central vascular congestion. Electronically Signed   By: Randa Ngo M.D.   On: 06/26/2019 22:02    Scheduled Meds: . apixaban  5 mg Oral BID  . budesonide (PULMICORT) nebulizer solution  0.5 mg Nebulization BID  . Chlorhexidine Gluconate Cloth  6 each Topical Daily  . famotidine  20 mg Oral BID  . guaiFENesin  600 mg Oral BID  . insulin aspart  0-15 Units Subcutaneous TID WC  .  insulin aspart  0-5 Units Subcutaneous QHS  . ipratropium-albuterol  3 mL Nebulization TID  . predniSONE  40 mg Oral Q breakfast  . Ensure Max Protein  11 oz Oral BID   Continuous Infusions: . sodium chloride  Stopped (06/27/19 0132)  . azithromycin 500 mg (06/27/19 1820)  . cefTRIAXone (ROCEPHIN)  IV 2 g (06/27/19 [id number])  . lactated ringers    . vancomycin       LOS: 1 day   Time spent: 45 minutes.  Lorella Nimrod, MD [facility]  If 7PM-7AM, please contact night-coverage [email or web address]  06/28/2019, 7:24 AM   This record has been created using Dragon voice recognition software. Errors have been sought and corrected,but may not always be located. Such creation errors do not reflect on the standard of care.

## 2019-06-29 ENCOUNTER — Inpatient Hospital Stay (HOSPITAL_COMMUNITY)
Admit: 2019-06-29 | Discharge: 2019-06-29 | Disposition: A | Discharge status: Home / Self Care | Payer: Medicare Other | Attending: Internal Medicine | Admitting: Internal Medicine

## 2019-06-29 DIAGNOSIS — I340 Nonrheumatic mitral (valve) insufficiency: Secondary | ICD-10-CM

## 2019-06-29 DIAGNOSIS — I361 Nonrheumatic tricuspid (valve) insufficiency: Secondary | ICD-10-CM

## 2019-06-29 LAB — BASIC METABOLIC PANEL
Anion gap: 10 (ref 5–15)
BUN: 30 mg/dL — ABNORMAL HIGH (ref 8–23)
CO2: 21 mmol/L — ABNORMAL LOW (ref 22–32)
Calcium: 8.5 mg/dL — ABNORMAL LOW (ref 8.9–10.3)
Chloride: 106 mmol/L (ref 98–111)
Creatinine, Ser: 1.07 mg/dL (ref 0.61–1.24)
GFR calc Af Amer: >60 mL/min (ref >60)
GFR calc non Af Amer: >60 mL/min (ref >60)
Glucose, Bld: 212 mg/dL — ABNORMAL HIGH (ref 70–99)
Potassium: 4.1 mmol/L (ref 3.5–5.1)
Sodium: 137 mmol/L (ref 135–145)

## 2019-06-29 LAB — GLUCOSE, CAPILLARY
Glucose-Capillary: 187 mg/dL — ABNORMAL HIGH (ref 70–99)
Glucose-Capillary: 227 mg/dL — ABNORMAL HIGH (ref 70–99)
Glucose-Capillary: 261 mg/dL — ABNORMAL HIGH (ref 70–99)
Glucose-Capillary: 326 mg/dL — ABNORMAL HIGH (ref 70–99)

## 2019-06-29 LAB — CULTURE, BLOOD (ROUTINE X 2): Special Requests: ADEQUATE

## 2019-06-29 LAB — CBC
HCT: 37.6 % — ABNORMAL LOW (ref 39.0–52.0)
Hemoglobin: 12.9 g/dL — ABNORMAL LOW (ref 13.0–17.0)
MCH: 30.4 pg (ref 26.0–34.0)
MCHC: 34.3 g/dL (ref 30.0–36.0)
MCV: 88.7 fL (ref 80.0–100.0)
Platelets: 162 10*3/uL (ref 150–400)
RBC: 4.24 MIL/uL (ref 4.22–5.81)
RDW: 13.3 % (ref 11.5–15.5)
WBC: 17.4 10*3/uL — ABNORMAL HIGH (ref 4.0–10.5)
nRBC: 0 % (ref 0.0–0.2)

## 2019-06-29 LAB — MAGNESIUM: Magnesium: 2.5 mg/dL — ABNORMAL HIGH (ref 1.7–2.4)

## 2019-06-29 LAB — PHOSPHORUS: Phosphorus: 2.4 mg/dL — ABNORMAL LOW (ref 2.5–4.6)

## 2019-06-29 LAB — ECHOCARDIOGRAM COMPLETE
Height: 71 in
Weight: 3464 oz

## 2019-06-29 MED ORDER — IPRATROPIUM BROMIDE 0.02 % IN SOLN
0.5000 mg | RESPIRATORY_TRACT | Status: DC | PRN
  Administered 2019-06-29 – 2019-06-30 (×3): 0.5 mg via RESPIRATORY_TRACT
  Filled 2019-06-29 (×3): qty 2.5

## 2019-06-29 MED ORDER — GUAIFENESIN-DM 100-10 MG/5ML PO SYRP
5.0000 mL | ORAL_SOLUTION | ORAL | Status: DC | PRN
  Administered 2019-06-29 – 2019-06-30 (×2): 5 mL via ORAL
  Filled 2019-06-29 (×2): qty 5

## 2019-06-29 MED ORDER — INSULIN ASPART 100 UNIT/ML ~~LOC~~ SOLN
0.0000 [IU] | Freq: Three times a day (TID) | SUBCUTANEOUS | Status: DC
  Administered 2019-06-29: 19:00:00 11 [IU] via SUBCUTANEOUS
  Administered 2019-06-30: 3 [IU] via SUBCUTANEOUS
  Administered 2019-06-30: 4 [IU] via SUBCUTANEOUS
  Administered 2019-06-30 – 2019-07-01 (×3): 3 [IU] via SUBCUTANEOUS
  Administered 2019-07-02: 11 [IU] via SUBCUTANEOUS
  Administered 2019-07-02: 3 [IU] via SUBCUTANEOUS
  Administered 2019-07-03: 7 [IU] via SUBCUTANEOUS
  Administered 2019-07-03: 15 [IU] via SUBCUTANEOUS
  Administered 2019-07-03 – 2019-07-04 (×3): 4 [IU] via SUBCUTANEOUS
  Administered 2019-07-05: 3 [IU] via SUBCUTANEOUS
  Filled 2019-06-29 (×14): qty 1

## 2019-06-29 MED ORDER — INSULIN GLARGINE 100 UNIT/ML ~~LOC~~ SOLN
10.0000 [IU] | Freq: Every day | SUBCUTANEOUS | Status: DC
  Administered 2019-06-29 – 2019-07-04 (×6): 10 [IU] via SUBCUTANEOUS
  Filled 2019-06-29 (×7): qty 0.1

## 2019-06-29 MED ORDER — INSULIN ASPART 100 UNIT/ML ~~LOC~~ SOLN
6.0000 [IU] | Freq: Three times a day (TID) | SUBCUTANEOUS | Status: DC
  Administered 2019-06-29 – 2019-07-05 (×16): 6 [IU] via SUBCUTANEOUS
  Filled 2019-06-29 (×17): qty 1

## 2019-06-29 MED ORDER — CEFAZOLIN SODIUM-DEXTROSE 2-4 GM/100ML-% IV SOLN
2.0000 g | Freq: Three times a day (TID) | INTRAVENOUS | Status: DC
  Administered 2019-06-29 – 2019-07-05 (×19): 2 g via INTRAVENOUS
  Filled 2019-06-29 (×24): qty 100

## 2019-06-29 NOTE — Progress Notes (Signed)
PROGRESS NOTE    Philip Potters Sr.  [id number] DOB: Apr 05, 1946 DOA: 06/26/2019 PCP: [name], DO   Brief Narrative:  This is a 74 yo male with a PMH of Type II Diabetes Mellitus, OSA (does not wear CPAP), Shingles, Paroxymal Atrial Fibrillation on Eliquis, Neuropathy, Hypercholesteremia, HOH, Bronchitis, Chronic Diastolic CHF (EF [id number]), CABG, CAD, COPD, Atrial Tachycardia, Asthma, and Arthritis.  He presented to [facility] ER on 02/25 via EMS with shortness of breath onset 02/25.  He also endorsed chills, shortness of breath, and a fever. He also states he returned to work this week (he works for [employer]). Upon EMS arrival he received solumedrol and duoneb's x2.  It was also noted cardiac rhythm atrial fibrillation with rvr hr 140's bpm. Influenza PCR/COVID-19 negative, however CXR concerning mild vascular congestion and LLL consolidation vs. Atelectasis. Sepsis protocol initiated pt received 1L LR bolus, 2L NS bolus, cefepime, solumedrol, azithromycin, and duoneb treatments.  Pt initially admitted to the medsurg unit per hospitalist team for additional workup and treatment.  However, he later developed hypotension requiring levophed gtt and transfer to ICU. Transferred out of ICU on 06/28/2019.  Blood pressure stable without levo.  Aerobic blood culture bottles are growing gram-positive cocci and vancomycin was added to ceftriaxone and azithromycin.  Subjective: Patient continued to experience exertional dyspnea with minimal exertion.  No hypoxia.  He was sitting in chair and grooming himself when seen this morning.  No other complaints.  Assessment & Plan:   Active Problems:   Chronic obstructive pulmonary disease ([place])   Shock ([place])   Community acquired pneumonia of left lower lobe of lung   Sepsis ([place])  Acute on chronic respiratory failure secondary to CAP and AECOPD  Hx: Bronchitis and Asthma.  Improving.  He was saturating well on room air.  No wheezing.   He was given higher doses of IV Solu-Medrol with breathing treatment. Pulmonary is following-appreciate their recommendations. -Start him on Pulmicort. -Transitioned IV Solu-Medrol to p.o. prednisone-continue prednisone. -Continue to monitor.  Hypotension secondary to septic shock due to MSSA bacteremia. Resolved.  Patient was normotensive now. -Blood cultures growing MSSA -Narrow antibiotics to cefazolin. -Pending echocardiogram to rule out endocarditis. -Repeat blood cultures were ordered today-not drawn this morning, requested nursing staff and lab to repeat blood cultures.  Atrial fibrillation with RVR.  Pain in A. fib but heart rate improved with restarting of home beta-blocker. -Continue home dose of beta-blocker. -Continue home dose of Eliquis.  Chronic diastolic CHF, CAD, Atrial Tachycardia, hypercholesterolemia. -No lower extremity edema. -Repeat chest x-ray-with left basilar airspace disease and a small left pleural effusion.  No pulmonary vascular congestion. -Restart home dose of ACE inhibitor if blood pressure allows. -Can restart home Lasix if needed. -Restart home dose of Lipitor.  Essential hypertension.  Currently normotensive without any pressor. -Restart home dose of beta-blocker. -Can restart home dose of ACE inhibitor if blood pressure allows.  Acute on chronic renal failure.  Resolved.  Secondary to sepsis, hypotension and lactic acidosis. Creatinine at 1.07 today around 1.1.  Type II diabetes mellitus.  Uncontrolled with A1c of 7.9.  CBG elevated as patient is on steroid.  -Add Lantus 10 units at bedtime along with mealtime coverage. -Continue to monitor-anticipated improvement as steroid dose has been decreased. -Continue SSI  Objective: Vitals:   06/29/19 0354 06/29/19 0500 06/29/19 0751 06/29/19 1158  BP: (!) 122/96  125/86 118/77  Pulse: 87  78 88  Resp: 18  18 16   Temp: 97.8  F (36.6 C)  98.2 F (36.8 C) 98 F (36.7 C)  TempSrc: Oral   Oral   SpO2: 96%  100% 97%  Weight:  98.2 kg    Height:        Intake/Output Summary (Last 24 hours) at 06/29/2019 1228 Last data filed at 06/29/2019 0915 Gross per 24 hour  Intake 454.38 ml  Output 502 ml  Net -47.62 ml   Filed Weights   06/27/19 0125 06/28/19 0500 06/29/19 0500  Weight: 96.6 kg 96.6 kg 98.2 kg    Examination:  General exam: Appears calm and comfortable  Respiratory system: Scattered crackles bilaterally. Respiratory effort normal. Cardiovascular system: Irregularly irregular, no JVD, murmurs, rubs, gallops or clicks. Gastrointestinal system: Soft, nontender, nondistended, bowel sounds positive. Central nervous system: Alert and oriented. No focal neurological deficits.Symmetric 5 x 5 power. Extremities: No LE edema, no cyanosis, pulses intact and symmetrical. Skin: No rashes, lesions or ulcers Psychiatry: Judgement and insight appear normal. Mood & affect appropriate.    DVT prophylaxis: Eliquis Code Status: Full Family Communication: No family at bedside Disposition Plan: Pending improvement.  Most likely will go back home.  Consultants:   PCCM  ID  Procedures:   Antimicrobials:  Cefazolin  Data Reviewed: I have personally reviewed following labs and imaging studies  CBC: Recent Labs  Lab 06/26/19 2145 06/27/19 0508 06/28/19 0425 06/29/19 0554  WBC 4.0 17.4* 12.8* 17.4*  NEUTROABS 2.9 14.9*  --   --   HGB 13.9 13.5 12.6* 12.9*  HCT 40.4 39.6 36.6* 37.6*  MCV 89.0 91.0 88.8 88.7  PLT 162 216 141* 0000000   Basic Metabolic Panel: Recent Labs  Lab 06/26/19 2224 06/27/19 0150 06/27/19 0508 06/27/19 0739 06/28/19 0425 06/29/19 0554  NA 140  --  141  --  139 137  K 3.2*  --  4.5  --  4.4 4.1  CL 110  --  111  --  109 106  CO2 21*  --  19*  --  24 21*  GLUCOSE 194*  --  255*  --  244* 212*  BUN 24*  --  26*  --  32* 30*  CREATININE 1.44*  --  1.52*  --  1.25* 1.07  CALCIUM 7.9*  --  7.8*  --  8.2* 8.5*  MG  --  1.0*  --  2.5* 2.4 2.5*   PHOS  --   --  2.0*  --  2.1* 2.4*   GFR: Estimated Creatinine Clearance: 74.6 mL/min (by C-G formula based on SCr of 1.07 mg/dL). Liver Function Tests: Recent Labs  Lab 06/26/19 2224  AST 33  ALT 24  ALKPHOS 80  BILITOT 1.0  PROT 4.9*  ALBUMIN 3.3*   No results for input(s): LIPASE, AMYLASE in the last 168 hours. No results for input(s): AMMONIA in the last 168 hours. Coagulation Profile: Recent Labs  Lab 06/26/19 2145  INR 1.6*   Cardiac Enzymes: No results for input(s): CKTOTAL, CKMB, CKMBINDEX, TROPONINI in the last 168 hours. BNP (last 3 results) No results for input(s): PROBNP in the last 8760 hours. HbA1C: Recent Labs    06/27/19 0150  HGBA1C 7.9*   CBG: Recent Labs  Lab 06/28/19 1147 06/28/19 1635 06/28/19 2105 06/29/19 0750 06/29/19 1157  GLUCAP 286* 327* 237* 187* 227*   Lipid Profile: No results for input(s): CHOL, HDL, LDLCALC, TRIG, CHOLHDL, LDLDIRECT in the last 72 hours. Thyroid Function Tests: No results for input(s): TSH, T4TOTAL, FREET4, T3FREE, THYROIDAB in the last 72 hours.  Anemia Panel: No results for input(s): VITAMINB12, FOLATE, FERRITIN, TIBC, IRON, RETICCTPCT in the last 72 hours. Sepsis Labs: Recent Labs  Lab 06/26/19 2224 06/26/19 2336 06/27/19 0150 06/27/19 0508 06/28/19 0425  PROCALCITON 7.19  --  22.84 26.84 18.23  LATICACIDVEN 2.6* 2.9* 3.8*  --   --     Recent Results (from the past 240 hour(s))  Blood Culture (routine x 2)     Status: None (Preliminary result)   Collection Time: 06/26/19  9:45 PM   Specimen: BLOOD  Result Value Ref Range Status   Specimen Description BLOOD LEFT HAND  Final   Special Requests   Final    BOTTLES DRAWN AEROBIC AND ANAEROBIC Blood Culture adequate volume   Culture   Final    NO GROWTH 3 DAYS Performed at [facility], [address], [place], [place] [address]    Report Status PENDING  Incomplete  Blood Culture (routine x 2)     Status: Abnormal   Collection Time:  06/26/19  9:58 PM   Specimen: BLOOD  Result Value Ref Range Status   Specimen Description   Final    BLOOD BLOOD RIGHT HAND Performed at [facility], [address] [place], [place] [address]    Special Requests   Final    BOTTLES DRAWN AEROBIC AND ANAEROBIC Blood Culture adequate volume Performed at [facility], [address] [place], [place] [address]    Culture  Setup Time   Final    GRAM POSITIVE COCCI AEROBIC BOTTLE ONLY CRITICAL RESULT CALLED TO, READ BACK BY AND VERIFIED WITH: KEYSHAWN PATEL 06/27/19 @ 1400  [place] Performed at [facility], [address] [place], [place] [address]    Culture STAPHYLOCOCCUS AUREUS (A)  Final   Report Status 06/29/2019 FINAL  Final   Organism ID, Bacteria STAPHYLOCOCCUS AUREUS  Final      Susceptibility   Staphylococcus aureus - MIC*    CIPROFLOXACIN >=8 RESISTANT Resistant     ERYTHROMYCIN 0.5 SENSITIVE Sensitive     GENTAMICIN <=0.5 SENSITIVE Sensitive     OXACILLIN 0.5 SENSITIVE Sensitive     TETRACYCLINE <=1 SENSITIVE Sensitive     VANCOMYCIN <=0.5 SENSITIVE Sensitive     TRIMETH/SULFA <=10 SENSITIVE Sensitive     CLINDAMYCIN <=0.25 SENSITIVE Sensitive     RIFAMPIN <=0.5 SENSITIVE Sensitive     Inducible Clindamycin NEGATIVE Sensitive     * STAPHYLOCOCCUS AUREUS  Respiratory Panel by RT PCR (Flu A&B, Covid) - Nasopharyngeal Swab     Status: None   Collection Time: 06/26/19 10:49 PM   Specimen: Nasopharyngeal Swab  Result Value Ref Range Status   SARS Coronavirus 2 by RT PCR NEGATIVE NEGATIVE Final    Comment: (NOTE) SARS-CoV-2 target nucleic acids are NOT DETECTED. The SARS-CoV-2 RNA is generally detectable in upper respiratoy specimens during the acute phase of infection. The lowest concentration of SARS-CoV-2 viral copies this assay can detect is 131 copies/mL. A negative result does not preclude SARS-Cov-2 infection and should not be used as the sole basis for treatment or other patient  management decisions. A negative result may occur with  improper specimen collection/handling, submission of specimen other than nasopharyngeal swab, presence of viral mutation(s) within the areas targeted by this assay, and inadequate number of viral copies (<131 copies/mL). A negative result must be combined with clinical observations, patient history, and epidemiological information. The expected result is Negative. Fact Sheet for Patients:  PinkCheek.be Fact Sheet for Healthcare Providers:  GravelBags.it This  test is not yet ap proved or cleared by the [place] and  has been authorized for detection and/or diagnosis of SARS-CoV-2 by FDA under an Emergency Use Authorization (EUA). This EUA will remain  in effect (meaning this test can be used) for the duration of the COVID-19 declaration under Section 564(b)(1) of the Act, 21 U.S.C. section 360bbb-3(b)(1), unless the authorization is terminated or revoked sooner.    Influenza A by PCR NEGATIVE NEGATIVE Final   Influenza B by PCR NEGATIVE NEGATIVE Final    Comment: (NOTE) The Xpert Xpress SARS-CoV-2/FLU/RSV assay is intended as an aid in  the diagnosis of influenza from Nasopharyngeal swab specimens and  should not be used as a sole basis for treatment. Nasal washings and  aspirates are unacceptable for Xpert Xpress SARS-CoV-2/FLU/RSV  testing. Fact Sheet for Patients: PinkCheek.be Fact Sheet for Healthcare Providers: GravelBags.it This test is not yet approved or cleared by the [place] FDA and  has been authorized for detection and/or diagnosis of SARS-CoV-2 by  FDA under an Emergency Use Authorization (EUA). This EUA will remain  in effect (meaning this test can be used) for the duration of the  Covid-19 declaration under Section 564(b)(1) of the Act, 21  U.S.C. section 360bbb-3(b)(1), unless the  authorization is  terminated or revoked. Performed at [facility], [address], [place], [place] [address]   Urine culture     Status: None   Collection Time: 06/27/19  1:30 AM   Specimen: In/Out Cath Urine  Result Value Ref Range Status   Specimen Description   Final    IN/OUT CATH URINE Performed at [facility], [address], [place], [place] [address]    Special Requests   Final    NONE Performed at [facility], [address], [place], [place] [address]    Culture   Final    NO GROWTH Performed at [facility], [place] [address], [place], [place] [address]    Report Status 06/28/2019 FINAL  Final  Culture, sputum-assessment     Status: None   Collection Time: 06/27/19  1:30 AM   Specimen: Expectorated Sputum  Result Value Ref Range Status   Specimen Description EXPECTORATED SPUTUM  Final   Special Requests NONE  Final   Sputum evaluation   Final    THIS SPECIMEN IS ACCEPTABLE FOR SPUTUM CULTURE Performed at [facility], [address] [place], [place] [address]    Report Status 06/27/2019 FINAL  Final  MRSA PCR Screening     Status: None   Collection Time: 06/27/19  1:30 AM   Specimen: Nasal Mucosa; Nasopharyngeal  Result Value Ref Range Status   MRSA by PCR NEGATIVE NEGATIVE Final    Comment:        The GeneXpert MRSA Assay (FDA approved for NASAL specimens only), is one component of a comprehensive MRSA colonization surveillance program. It is not intended to diagnose MRSA infection nor to guide or monitor treatment for MRSA infections. Performed at [facility], [address] [place], [place] [address]   Culture, respiratory     Status: None (Preliminary result)   Collection Time: 06/27/19  1:30 AM  Result Value Ref Range Status   Specimen Description   Final    EXPECTORATED SPUTUM Performed at [facility], [address] [place], [place] [address]    Special Requests   Final      NONE Reflexed from [phone] Performed at [facility],  [place], [place] [address]    Gram Stain   Final    ABUNDANT WBC PRESENT,BOTH PMN AND MONONUCLEAR MODERATE GRAM POSITIVE COCCI FEW GRAM POSITIVE RODS RARE GRAM NEGATIVE RODS RARE YEAST MODERATE SQUAMOUS EPITHELIAL CELLS PRESENT    Culture   Final    CULTURE REINCUBATED FOR BETTER GROWTH Performed at [facility], [address], [place], [place] [address]    Report Status PENDING  Incomplete  Group A Strep by PCR     Status: None   Collection Time: 06/27/19  2:11 AM   Specimen: Throat; Sterile Swab  Result Value Ref Range Status   Group A Strep by PCR NOT DETECTED NOT DETECTED Final    Comment: Performed at [facility], [address] [place], [place] [address]     Radiology Studies: DG Chest 2 View  Result Date: 06/28/2019 CLINICAL DATA:  Sepsis and shortness of breath. EXAM: CHEST - 2 VIEW COMPARISON:  06/26/2019 FINDINGS: Cardiomediastinal contours are stable following median sternotomy and CABG compared to the prior study. The medial aspect of the left hemidiaphragm is obscured. Airspace disease seen on lateral view. Potentially associated with small left-sided pleural effusion. Visualized skeletal structures are unremarkable aside from lower cervical spinal fusion. IMPRESSION: Left basilar airspace disease and possible small left pleural effusion. Findings may represent pneumonia or atelectasis. Electronically Signed   By: Zetta Bills M.D.   On: 06/28/2019 14:52    Scheduled Meds: . apixaban  5 mg Oral BID  . atorvastatin  40 mg Oral Daily  . budesonide (PULMICORT) nebulizer solution  0.5 mg Nebulization BID  . Chlorhexidine Gluconate Cloth  6 each Topical Daily  . famotidine  20 mg Oral BID  . gabapentin  100 mg Oral TID  . guaiFENesin  600 mg Oral BID  . insulin aspart  0-15 Units Subcutaneous TID WC  . insulin aspart  0-5 Units Subcutaneous QHS  . levothyroxine  88  mcg Oral Daily  . metoprolol tartrate  25 mg Oral BID  . montelukast  10 mg Oral QHS  . phosphorus  500 mg Oral BID  . predniSONE  20 mg Oral Q breakfast  . Ensure Max Protein  11 oz Oral BID   Continuous Infusions: . sodium chloride Stopped (06/27/19 0132)  .  ceFAZolin (ANCEF) IV    . lactated ringers       LOS: 2 days   Time spent: 35 minutes.  Lorella Nimrod, MD [facility]  If 7PM-7AM, please contact night-coverage [email or web address]  06/29/2019, 12:28 PM   This record has been created using Systems analyst. Errors have been sought and corrected,but may not always be located. Such creation errors do not reflect on the standard of care.

## 2019-06-29 NOTE — Consult Note (Signed)
Pharmacy Antibiotic Note  Philip [name]. is a 74 y.o. male admitted on 06/26/2019 with bacteremia.  Pharmacy has been consulted for Cefazolin dosing.  Blood cultures have returned MSSA bacteremia - will d/c Vancomycin and start Cefazolin.  Pt with documented cephalosporin use in recent history  Plan: Will start Cefazolin 2g q8h  Height: 5\' 11"  (180.3 cm) Weight: 216 lb 8 oz (98.2 kg) IBW/kg (Calculated) : 75.3  Temp (24hrs), Avg:97.8 F (36.6 C), Min:97.6 F (36.4 C), Max:98.2 F (36.8 C)  Recent Labs  Lab 06/26/19 2145 06/26/19 2224 06/26/19 2336 06/27/19 0150 06/27/19 0508 06/28/19 0425 06/29/19 0554  WBC 4.0  --   --   --  17.4* 12.8* 17.4*  CREATININE  --  1.44*  --   --  1.52* 1.25* 1.07  LATICACIDVEN  --  2.6* 2.9* 3.8*  --   --   --     Estimated Creatinine Clearance: 74.6 mL/min (by C-G formula based on SCr of 1.07 mg/dL).    Allergies  Allergen Reactions  . Bydureon [Exenatide] Anaphylaxis    Possible from bydureon, not 100% confirmed  . Clindamycin/Lincomycin Hives and Itching  . Penicillins Hives, Itching, Rash and Other (See Comments)    Has patient had a PCN reaction causing immediate rash, facial/tongue/throat swelling, SOB or lightheadedness with hypotension: Yes Has patient had a PCN reaction causing severe rash involving mucus membranes or skin necrosis: No Has patient had a PCN reaction that required hospitalization: No Has patient had a PCN reaction occurring within the last 10 years: No If all of the above answers are "NO", then may proceed with Cephalosporin use.  . Sulfa Antibiotics Shortness Of Breath  . Spiriferis Other (See Comments)    Affected breathing  . Spiriva Handihaler [Tiotropium Bromide Monohydrate]     Affected breathing  . Ivp Dye [Iodinated Diagnostic Agents] Rash and Other (See Comments)    Redness    Antimicrobials this admission: Azithromycin 2/25 >> 2/26 Cefepime 2/25 >> 2/25 Ceftriaxone 2/26 >> 2/27 Vancomycin  2/26 >> 2/27 Cefazolin 2/28 >>  Dose adjustments this admission: 2/27 vanc 1500 mg q24h > 1750 mg q24h  Microbiology results: 2/25 BCx: staph aureus - MSSA 2/26 UCx: pending  2/26 MRSA PCR: NEG COVID/Influenza A/B NEG  Thank you for allowing pharmacy to be a part of this patient's care.  Lu Duffel, PharmD, BCPS Clinical Pharmacist 06/29/2019 9:02 AM

## 2019-06-29 NOTE — Progress Notes (Signed)
Pharmacy Electrolyte Monitoring Consult:  Pharmacy consulted to assist in monitoring and replacing electrolytes in this 74 y.o. male admitted on 06/26/2019 with septic shock and acute respiratory failure. Patient with past medical history significant for COPD, diastolic dysfunction, atrial fibrillation, diabetes, shingles, CAD, HTN, asthma, and arthritis.    Labs:  Sodium (mmol/L)  Date Value  06/29/2019 137  04/17/2019 140   Potassium (mmol/L)  Date Value  06/29/2019 4.1   Magnesium (mg/dL)  Date Value  06/29/2019 2.5 (H)   Phosphorus (mg/dL)  Date Value  06/29/2019 2.4 (L)   Calcium (mg/dL)  Date Value  06/29/2019 8.5 (L)   Albumin (g/dL)  Date Value  06/26/2019 3.3 (L)  12/06/2015 3.9    Assessment/Plan: Conitnue K Phos Neutral 2 tabs x 2 doses, which will provide 32 mmol phos total.  Will obtain electrolytes with am labs.   Will replace for goal potassium ~ 4 and goal magnesium ~ 2.   SSI moderate -continue to monitor on prednisone.  Pharmacy will continue to monitor and adjust per consult.   Lu Duffel, PharmD, BCPS Clinical Pharmacist 06/29/2019 9:10 AM

## 2019-06-29 NOTE — Progress Notes (Signed)
Physical Therapy Treatment Patient Details Name: Philip MARBLE Sr. MRN: [id number] DOB: Jul 27, 1945 Today's Date: 06/29/2019    History of Present Illness presented to ER secondary to dyspnea, cough, fever and chills; admitted for management of acute/chronic respiratory failure secondary to CAD/COPD with L LL PNA.  Noted in Afib with RVR upon arrival to ER.    PT Comments    Pt received in bed watching tv with wife present. Pt agreeable to PT. Pt reporting no pain and mild difficulty breathing. Pt had been up within the room earlier in the day. Pt ambulated 3 laps around nurses station with supervision and vitals monitored throughout. HR up to 123 during ambulation and O2 sats at least 92% on RA during ambulation. Pt performed standing LE therex at counter for improved LE strength and pt encouraged to perform at home as HEP. Pt continued to work on aerobic capacity and LE strengthening with 10 STS from bed with mild UE support on thighs to rise. HR up to 140 post repetitive STS and very quickly decreasing to below 120 with seated rest. Mild DOE after repetitive STS although pt conversing. Pt is progressing well towards PT goals. Recommendation remains appropriate.     Follow Up Recommendations  No PT follow up     Equipment Recommendations  None recommended by PT    Recommendations for Other Services Other (comment)(outpatient pulmonary rehab)     Precautions / Restrictions Precautions Precautions: Fall Restrictions Weight Bearing Restrictions: No    Mobility  Bed Mobility Overal bed mobility: Modified Independent                Transfers Overall transfer level: Modified independent                  Ambulation/Gait Ambulation/Gait assistance: Supervision Gait Distance (Feet): 500 Feet Assistive device: None Gait Pattern/deviations: WFL(Within Functional Limits)     General Gait Details: pt ambulated 3 laps around nurses station, no unsteadiness or LOB noted,  mild SOB during ambulation with vitals WFL on RA and able to maintain conversation, reviewed activity pacing   Stairs             Wheelchair Mobility    Modified Rankin (Stroke Patients Only)       Balance Overall balance assessment: Mild deficits observed, not formally tested                                          Cognition Arousal/Alertness: Awake/alert Behavior During Therapy: WFL for tasks assessed/performed Overall Cognitive Status: Within Functional Limits for tasks assessed                                        Exercises Other Exercises Other Exercises: pt performed standing LE therex at counter with BUE support 10 reps each leg of heel raises, hip abd, hip ext, standing knee flexion, pt encouraged to perform as HEP Other Exercises: 10x STS from bed, mild use of hands on thighs, HR up to 140 very quickly decreasing sitting EOB    General Comments        Pertinent Vitals/Pain Pain Assessment: No/denies pain    Home Living                      Prior Function  PT Goals (current goals can now be found in the care plan section) Progress towards PT goals: Progressing toward goals    Frequency    Min 2X/week      PT Plan Current plan remains appropriate    Co-evaluation              AM-PAC PT "6 Clicks" Mobility   Outcome Measure  Help needed turning from your back to your side while in a flat bed without using bedrails?: None Help needed moving from lying on your back to sitting on the side of a flat bed without using bedrails?: None Help needed moving to and from a bed to a chair (including a wheelchair)?: None Help needed standing up from a chair using your arms (e.g., wheelchair or bedside chair)?: None Help needed to walk in hospital room?: None Help needed climbing 3-5 steps with a railing? : A Little 6 Click Score: 23    End of Session Equipment Utilized During Treatment:  Gait belt Activity Tolerance: Patient tolerated treatment well Patient left: in bed;with call bell/phone within reach;with family/visitor present Nurse Communication: Mobility status PT Visit Diagnosis: Difficulty in walking, not elsewhere classified (R26.2)     Time: [id number] PT Time Calculation (min) (ACUTE ONLY): 14 min  Charges:  $Therapeutic Exercise: 8-22 mins                     Caroleena Paolini [place] PT, DPT 4:04 PM,06/29/19 [phone]    Darian Ace Drucilla Chalet 06/29/2019, 4:00 PM

## 2019-06-30 ENCOUNTER — Telehealth: Payer: Self-pay

## 2019-06-30 DIAGNOSIS — I4891 Unspecified atrial fibrillation: Secondary | ICD-10-CM

## 2019-06-30 DIAGNOSIS — R778 Other specified abnormalities of plasma proteins: Secondary | ICD-10-CM

## 2019-06-30 DIAGNOSIS — I10 Essential (primary) hypertension: Secondary | ICD-10-CM

## 2019-06-30 DIAGNOSIS — I5020 Unspecified systolic (congestive) heart failure: Secondary | ICD-10-CM

## 2019-06-30 LAB — GLUCOSE, CAPILLARY
Glucose-Capillary: 127 mg/dL — ABNORMAL HIGH (ref 70–99)
Glucose-Capillary: 145 mg/dL — ABNORMAL HIGH (ref 70–99)
Glucose-Capillary: 172 mg/dL — ABNORMAL HIGH (ref 70–99)
Glucose-Capillary: 247 mg/dL — ABNORMAL HIGH (ref 70–99)

## 2019-06-30 LAB — BASIC METABOLIC PANEL
Anion gap: 7 (ref 5–15)
BUN: 27 mg/dL — ABNORMAL HIGH (ref 8–23)
CO2: 26 mmol/L (ref 22–32)
Calcium: 8.4 mg/dL — ABNORMAL LOW (ref 8.9–10.3)
Chloride: 108 mmol/L (ref 98–111)
Creatinine, Ser: 0.92 mg/dL (ref 0.61–1.24)
GFR calc Af Amer: >60 mL/min (ref >60)
GFR calc non Af Amer: >60 mL/min (ref >60)
Glucose, Bld: 137 mg/dL — ABNORMAL HIGH (ref 70–99)
Potassium: 4.2 mmol/L (ref 3.5–5.1)
Sodium: 141 mmol/L (ref 135–145)

## 2019-06-30 LAB — PHOSPHORUS: Phosphorus: 2.6 mg/dL (ref 2.5–4.6)

## 2019-06-30 LAB — CBC
HCT: 35.6 % — ABNORMAL LOW (ref 39.0–52.0)
Hemoglobin: 12.6 g/dL — ABNORMAL LOW (ref 13.0–17.0)
MCH: 31.1 pg (ref 26.0–34.0)
MCHC: 35.4 g/dL (ref 30.0–36.0)
MCV: 87.9 fL (ref 80.0–100.0)
Platelets: 168 10*3/uL (ref 150–400)
RBC: 4.05 MIL/uL — ABNORMAL LOW (ref 4.22–5.81)
RDW: 13.3 % (ref 11.5–15.5)
WBC: 15.5 10*3/uL — ABNORMAL HIGH (ref 4.0–10.5)
nRBC: 0 % (ref 0.0–0.2)

## 2019-06-30 LAB — TSH: TSH: 2.676 u[IU]/mL (ref 0.350–4.500)

## 2019-06-30 LAB — BRAIN NATRIURETIC PEPTIDE: B Natriuretic Peptide: 857 pg/mL — ABNORMAL HIGH (ref 0.0–100.0)

## 2019-06-30 LAB — PATHOLOGIST SMEAR REVIEW

## 2019-06-30 LAB — MAGNESIUM: Magnesium: 2.3 mg/dL (ref 1.7–2.4)

## 2019-06-30 MED ORDER — METOPROLOL TARTRATE 50 MG PO TABS
50.0000 mg | ORAL_TABLET | Freq: Two times a day (BID) | ORAL | Status: DC
  Administered 2019-06-30 – 2019-07-02 (×4): 50 mg via ORAL
  Filled 2019-06-30 (×5): qty 1

## 2019-06-30 MED ORDER — METOPROLOL TARTRATE 25 MG PO TABS
25.0000 mg | ORAL_TABLET | Freq: Once | ORAL | Status: AC
  Administered 2019-06-30: 25 mg via ORAL
  Filled 2019-06-30: qty 1

## 2019-06-30 NOTE — Consult Note (Signed)
Cardiology Consultation:   Patient ID: Philip MECHAM Sr. MRN: [id number]; DOB: 1945-05-13  Admit date: 06/26/2019 Date of Consult: 06/30/2019  Primary Care Provider: [name], DO Primary Cardiologist: [name], MD  Primary Electrophysiologist:  None    Patient Profile:   Philip Potters Sr. is a 74 y.o. male with a hx of  w CAD s/p three-vessel bypass 05/2012, hypertension, hyperlipidemia, DM2, diastolic dysfunction, paroxysmal atrial fibrillation, ectopic atrial tachycardia, and COPD, who presents today for evaluation of transesophageal echocardiogram in the setting of bacteremia and atrial fibrillation at the request of Dr. Reesa Chew.  History of Present Illness:   Philip [name] is a 74 year old male with PMH as above.  He is on anticoagulation with Eliquis.  He was previously on amiodarone, discontinued 11/2016 due to concern for the side effects.  He is not interested in catheter ablation for ectopic atrial tachycardia at that time.  He was managed by beta-blocker therapy from that point forward.  He was seen 04/17/2019 in the clinic and reportedly doing well.  He reported stable, chronic DOE.  He noted frequent chest congestion without any chest pain, palpitations, or additional symptoms.  He was checking his heart rate and blood pressure on a daily basis with SBP well controlled and heart rates in the low 80s, recently up into the 90s.  He was in atrial fibrillation at that time with a rate of 87 and asymptomatic. He was continued on metoprolol 60m BID and was instructed to call if HR elevated at home.   Prior to admission, patient reports that he started to feel shortness of breath at rest and with exertion.  He was reportedly taking care of his wife after she tripped and fell, hurting her head, and then passed out and reportedly broke her neck.  Therefore, he delayed seeking medical attention.  He stated that 1 afternoon he was walking up the hill to his house after taking the  dog out and felt so short of breath that he came inside and laid down; however, when he continued to feel short of breath, he [facility] his [facility] He denied any CP but did feel chest tightness, SOB, and DOE worsened from baseline. No racing HR or palpitations. No syncope or falls. He denied any significant orthopnea, LEE, or abdominal distention; however, since admitted, he has noted abdominal distention and LEE increased from baseline.   Per EMS, he required inhalers and breathing treatments on arrival and noted to be in Afib with RVR and rates into the 140s. [facility] and acute on chronic respiratory failure secondary to CAP and AECOPD.  In the ED, he was found to be hypokalemic with K 3.2 with AKI Cr 1.44, BUN 24, hypoalbuminemia, HS Tn minimally elevated and flat trending at 8  7, Lactic acid 2.6. Blood cultures positive for MSSA bacteremia. CXR showed LLL consolidation that could reflect atelectasis or airspace disease and mild vascular congestion. He was admitted and started on abx and breathing treatments. During admission, became hypotensive and required transfer to the ICU and levophed gtt. He has since been taken off of pressors but remains in Afib with tachycardic ventricular rates into the 150s. Cardiology has been consulted for TEE due to bacteremia.  Most recent chest x-ray shows left bibasilar airspace disease and possible small left pleural effusion that may represent pneumonia or atelectasis.  Heart Pathway Score:     Past Medical  History:  Diagnosis Date  . Arthritis    a. right knee  . Asthma   . Atrial tachycardia ([place])   . 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 etiology.  [name] COPD (chronic obstructive pulmonary disease) ([place])   . Coronary artery disease    a. 05/2012 Cath: severe 3VD-->CABG x 4 by Dr. Servando Snare in 1/2014[place], SVG->LCX & SVG->RPDA; b. 06/2014 MV: no ischemia/infarct.  . Diastolic  dysfunction    a. 06/2015 Echo: EF 50-55%, Gr1 DD. Mild MR. Mildly dil LA. Nl RV fxn. Nl PASP.  [name] Edema    LEGS/ FEET  . Essential hypertension   . History of bronchitis   . HOH (hard of hearing)   . Hypercholesteremia   . Neuropathy   . PAF (paroxysmal atrial fibrillation) ([facility])    a. CHA2DS2VASc = 4-->Eliquis.  . Shingles   . Sleep apnea    NO CPAP  . Type II diabetes mellitus ([place])     Past Surgical History:  Procedure Laterality Date  . ANTERIOR CERVICAL DECOMP/DISCECTOMY FUSION  ~ 2009  . BACK SURGERY     CERVICAL FUSION  . CARDIAC CATHETERIZATION  05/06/2012   Significant three-vessel coronary artery disease with normal ejection fraction  . CATARACT EXTRACTION W/PHACO Right 08/15/2017   Procedure: CATARACT EXTRACTION PHACO AND INTRAOCULAR LENS PLACEMENT (IOC);  Surgeon: Birder Robson, MD;  Location: [facility] ORS;  Service: Ophthalmology;  Laterality: Right;  [place] 00:23 AP% 13.3 CDE 3.11 Fluid pack lot # [id number]  . CATARACT EXTRACTION W/PHACO Left 10/03/2017   Procedure: CATARACT EXTRACTION PHACO AND INTRAOCULAR LENS PLACEMENT (IOC);  Surgeon: Birder Robson, MD;  Location: [facility] ORS;  Service: Ophthalmology;  Laterality: Left;  Lot #[id number] H [place]: 00:21.2 AP%:13.9 CDE: 2.96  . CORONARY ANGIOPLASTY    . CORONARY ARTERY BYPASS GRAFT  05/07/2012   Procedure: CORONARY ARTERY BYPASS GRAFTING (CABG);  Surgeon: [name], MD;  Location: [place];  Service: Open Heart Surgery;  Laterality: N/A;  times three  . INTRAOPERATIVE TRANSESOPHAGEAL ECHOCARDIOGRAM  05/07/2012   Procedure: INTRAOPERATIVE TRANSESOPHAGEAL ECHOCARDIOGRAM;  Surgeon: [name], MD;  Location: [place];  Service: Open Heart Surgery;  Laterality: N/A;  . KNEE ARTHROSCOPY  ~ 2000   "right" (05/06/2012)  . KNEE SURGERY  2003   right  . NECK SURGERY  2008   Plate in neck     Home Medications:  Prior to Admission medications   Medication Sig Start Date End Date Taking? Authorizing Provider  albuterol  (PROVENTIL) (2.5 MG/3ML) 0.083% nebulizer solution Inhale 3 mLs into the lungs every 6 (six) hours as needed for wheezing or shortness of breath.  03/13/16  Yes [provider]  Albuterol Sulfate 108 (90 Base) MCG/ACT AEPB Inhale 2 puffs into the lungs every 6 (six) hours as needed (shortness of breath).    Yes [provider]  Artificial Tear Ointment (DRY EYES OP) Place 1 drop into both eyes daily as needed (for dry eyes).   Yes [provider]  atorvastatin (LIPITOR) 40 MG tablet Take 1 tablet by mouth once daily 09/30/18  Yes [name], MD  cetirizine (ZYRTEC) 10 MG tablet Take 10 mg by mouth daily.    Yes [provider]  diclofenac sodium (VOLTAREN) 1 % GEL Apply 2 g topically 3 (three) times daily as needed (Use on elbow bursitis and knee). 07/04/17  Yes [name], [name], DO  ELIQUIS 5 MG TABS tablet Take 1 tablet by mouth twice daily 11/27/18  Yes [name], MD  fluticasone (FLONASE) 50 MCG/ACT nasal spray Place 1 spray into both nostrils daily as needed for allergies or rhinitis.   Yes [provider]  furosemide (LASIX) 40 MG tablet TAKE 1 TABLET BY MOUTH ONCE DAILY. APPOINTMENT NEEDED FOR FURTHER REFILLS. Patient taking differently: Every other day 12/31/18  Yes Dunn, Areta Haber, [name]  gabapentin (NEURONTIN) 100 MG capsule TAKE 1 CAPSULE BY MOUTH THREE TIMES DAILY 05/22/19  Yes [name], [name], DO  ipratropium-albuterol (DUONEB) 0.5-2.5 (3) MG/3ML SOLN Take 3 mLs by nebulization 4 (four) times daily. 02/12/19  Yes [provider]  levothyroxine (SYNTHROID) 88 MCG tablet Take 1 tablet by mouth once daily 03/16/19  Yes [name], [name], DO  losartan (COZAAR) 25 MG tablet Take 1 tablet by mouth once daily 03/31/19  Yes Arida, Mertie Clause, MD  metFORMIN (GLUCOPHAGE) 500 MG tablet TAKE 2 TABLETS BY MOUTH TWICE DAILY WITH A MEAL Patient taking differently: Take 1,000 mg by mouth 2 (two) times daily with a meal.   05/12/19  Yes [name], [name], DO  metoprolol tartrate (LOPRESSOR) 25 MG tablet TAKE 1 TABLET BY MOUTH TWICE DAILY . APPOINTMENT REQUIRED FOR FUTURE REFILLS Patient taking differently: Take 25 mg by mouth 2 (two) times daily.  05/22/19  Yes [name], MD  montelukast (SINGULAIR) 10 MG tablet Take 10 mg by mouth at bedtime.  02/08/15  Yes [provider]  predniSONE (DELTASONE) 5 MG tablet Take 5 mg by mouth every other day.  09/04/17  Yes [provider]  SYMBICORT 160-4.5 MCG/ACT inhaler Inhale 2 puffs 2 (two) times daily into the lungs.  08/11/16  Yes [provider]  Blood Glucose Monitoring Suppl (ONE TOUCH ULTRA 2) w/Device KIT Use to check blood sugar as advised up to twice a day 03/10/19   [name], [name], DO  guaiFENesin (MUCINEX) 600 MG 12 hr tablet Take 600 mg by mouth 2 (two) times daily as needed.    [provider]  LANCETS ULTRA THIN MISC 1 Device by Does not apply route 4 (four) times daily -  before meals and at bedtime. 05/17/12   [name], MD  [facility] ULTRA test strip USE TEST STRIP(S) TO CHECK GLUCOSE TWICE DAILY 12/10/18   [name], DO    Inpatient Medications: Scheduled Meds: . apixaban  5 mg Oral BID  . atorvastatin  40 mg Oral Daily  . budesonide (PULMICORT) nebulizer solution  0.5 mg Nebulization BID  . Chlorhexidine Gluconate Cloth  6 each Topical Daily  . famotidine  20 mg Oral BID  . gabapentin  100 mg Oral TID  . guaiFENesin  600 mg Oral BID  . insulin aspart  0-20 Units Subcutaneous TID WC  . insulin aspart  0-5 Units Subcutaneous QHS  . insulin aspart  6 Units Subcutaneous TID WC  . insulin glargine  10 Units Subcutaneous QHS  . levothyroxine  88 mcg Oral Daily  . metoprolol tartrate  25 mg Oral BID  . montelukast  10 mg Oral QHS  . phosphorus  500 mg Oral BID  . predniSONE  20 mg Oral Q breakfast  . Ensure Max Protein  11 oz Oral BID   Continuous Infusions: . sodium  chloride Stopped (06/27/19 0132)  .  ceFAZolin (ANCEF) IV Stopped (06/30/19 0506)  . lactated ringers     PRN Meds: guaiFENesin-dextromethorphan, ipratropium, levalbuterol, phenol  Allergies:    Allergies  Allergen Reactions  . Bydureon [Exenatide] Anaphylaxis  Possible from bydureon, not 100% confirmed  . Clindamycin/Lincomycin Hives and Itching  . Penicillins Hives, Itching, Rash and Other (See Comments)    Has patient had a PCN reaction causing immediate rash, facial/tongue/throat swelling, SOB or lightheadedness with hypotension: Yes Has patient had a PCN reaction causing severe rash involving mucus membranes or skin necrosis: No Has patient had a PCN reaction that required hospitalization: No Has patient had a PCN reaction occurring within the last 10 years: No If all of the above answers are "NO", then may proceed with Cephalosporin use.  . Sulfa Antibiotics Shortness Of Breath  . Spiriferis Other (See Comments)    Affected breathing  . Spiriva Handihaler [Tiotropium Bromide Monohydrate]     Affected breathing  . Ivp Dye [Iodinated Diagnostic Agents] Rash and Other (See Comments)    Redness    Social History:   Social History   Socioeconomic History  . Marital status: Married    Spouse name: Not on file  . Number of children: Not on file  . Years of education: Not on file  . Highest education level: Some college, no degree  Occupational History  . Not on file  Tobacco Use  . Smoking status: Former Smoker    Packs/day: 1.00    Years: 8.00    Pack years: 8.00    Types: Cigarettes    Quit date: 12/31/1967    Years since quitting: 51.5  . Smokeless tobacco: Former Network engineer and Sexual Activity  . Alcohol use: No    Alcohol/week: 0.0 standard drinks    Comment: 05/06/2012 "last alcohol several years ago; never had problem wit"  . Drug use: No  . Sexual activity: Not Currently  Other Topics Concern  . Not on file  Social History Narrative   ** Merged  History Encounter **          Working part time - not currently working because of covid-19    Social Determinants of Radio broadcast assistant Strain:   . Difficulty of Paying Living Expenses: Not on file  Food Insecurity:   . Worried About Charity fundraiser in the Last Year: Not on file  . Ran Out of Food in the Last Year: Not on file  Transportation Needs:   . Lack of Transportation (Medical): Not on file  . Lack of Transportation (Non-Medical): Not on file  Physical Activity:   . Days of Exercise per Week: Not on file  . Minutes of Exercise per Session: Not on file  Stress:   . Feeling of Stress : Not on file  Social Connections:   . Frequency of Communication with Friends and Family: Not on file  . Frequency of Social Gatherings with Friends and Family: Not on file  . Attends Religious Services: Not on file  . Active Member of Clubs or Organizations: Not on file  . Attends Archivist Meetings: Not on file  . Marital Status: Not on file  Intimate Partner Violence:   . Fear of Current or Ex-Partner: Not on file  . Emotionally Abused: Not on file  . Physically Abused: Not on file  . Sexually Abused: Not on file    Family History:    Family History  Problem Relation Age of Onset  . Heart disease Mother   . Clotting disorder Mother   . Hypertension Sister      ROS:  Please see the history of present illness.  Review of Systems  Constitutional: Positive for  malaise/fatigue.  Respiratory: Positive for cough, hemoptysis, sputum production, shortness of breath and wheezing.   Cardiovascular: Positive for leg swelling. Negative for orthopnea.       Chest tightness  Gastrointestinal: Negative for abdominal pain, blood in stool and melena.  Genitourinary: Negative for hematuria.  Musculoskeletal: Negative for falls.  Neurological: Positive for weakness. Negative for loss of consciousness.  All other systems reviewed and are negative.   All other ROS  reviewed and negative.     Physical Exam/Data:   Vitals:   06/29/19 1923 06/29/19 2007 06/30/19 0519 06/30/19 0722  BP: 135/83  133/88 (!) 148/92  Pulse: 97  90 98  Resp: 18  20 18   Temp: 98.3 F (36.8 C)  97.8 F (36.6 C) 98.2 F (36.8 C)  TempSrc: Oral  Oral Oral  SpO2: 97% 97% 95% 95%  Weight:   98.9 kg   Height:        Intake/Output Summary (Last 24 hours) at 06/30/2019 1128 Last data filed at 06/30/2019 0521 Gross per 24 hour  Intake 717.81 ml  Output 2050 ml  Net -1332.19 ml   Last 3 Weights 06/30/2019 06/29/2019 06/28/2019  Weight (lbs) 218 lb 1.6 oz 216 lb 8 oz 212 lb 15.4 oz  Weight (kg) 98.93 kg 98.204 kg 96.6 kg     Body mass index is 30.42 kg/m.  General:  Well nourished, well developed, in no acute distress but frequently coughs HEENT: normal.   Neck: JVP approximately 11 cm Vascular: No carotid bruits; radial pulses 2+ bilaterally Cardiac:  normal S1, B9;TJQZ; 1/6 systolic murmur Lungs: Bilateral wheezing with bibasilar reduced breath sounds worse on left than right Abd: Firm and distended Ext: 1-2+ bilateral lower extremity edema Musculoskeletal:  No deformities, BUE and BLE strength normal and equal Skin: warm and dry  Neuro:  No focal abnormalities noted Psych:  Normal affect   EKG:  The EKG was personally reviewed and demonstrates:  Afib with RVR and ventricular rate 118bpm, delayed R wave progression, no acute changes Telemetry:  Telemetry was personally reviewed and demonstrates:  Atrial fibrillation with rates into the 150s  Relevant CV Studies: Echo 06/29/19 1. Left ventricular ejection fraction, by estimation, is 40 to 45%. The  left ventricle has mildly decreased function. The left ventricle  demonstrates global hypokinesis. The left ventricular internal cavity size  was moderately dilated. Left ventricular  diastolic parameters are indeterminate.  2. Right ventricular systolic function is mildly reduced. The right  ventricular size is mildly  enlarged. There is moderately elevated  pulmonary artery systolic pressure.  3. Left atrial size was mildly dilated.  4. Right atrial size was mildly dilated.  5. The mitral valve is normal in structure and function. Mild to moderate  mitral valve regurgitation. No evidence of mitral stenosis.  6. Tricuspid valve regurgitation is mild to moderate.  7. No valve vegetation noted.   08/24/2015 Holter, 48H Normal sinus rhythm with frequent PACs. A total of 18,000 PACs in 48 hours of presenting 12%. Lowest heart rate was 43 bpm with overall no pauses greater than 2 seconds.  Laboratory Data:  High Sensitivity Troponin:   Recent Labs  Lab 06/26/19 2224 06/26/19 2336  TROPONINIHS 8 7     Cardiac EnzymesNo results for input(s): TROPONINI in the last 168 hours. No results for input(s): TROPIPOC in the last 168 hours.  Chemistry Recent Labs  Lab 06/28/19 0425 06/29/19 0554 06/30/19 0448  NA 139 137 141  K 4.4 4.1 4.2  CL 109  106 108  CO2 24 21* 26  GLUCOSE 244* 212* 137*  BUN 32* 30* 27*  CREATININE 1.25* 1.07 0.92  CALCIUM 8.2* 8.5* 8.4*  GFRNONAA 57* >60 >60  GFRAA >60 >60 >60  ANIONGAP 6 10 7     Recent Labs  Lab 06/26/19 2224  PROT 4.9*  ALBUMIN 3.3*  AST 33  ALT 24  ALKPHOS 80  BILITOT 1.0   Hematology Recent Labs  Lab 06/28/19 0425 06/29/19 0554 06/30/19 0448  WBC 12.8* 17.4* 15.5*  RBC 4.12* 4.24 4.05*  HGB 12.6* 12.9* 12.6*  HCT 36.6* 37.6* 35.6*  MCV 88.8 88.7 87.9  MCH 30.6 30.4 31.1  MCHC 34.4 34.3 35.4  RDW 13.1 13.3 13.3  PLT 141* 162 168   BNP Recent Labs  Lab 06/26/19 2145  BNP 583.0*    DDimer No results for input(s): DDIMER in the last 168 hours.   Radiology/Studies:  DG Chest 2 View  Result Date: 06/28/2019 CLINICAL DATA:  Sepsis and shortness of breath. EXAM: CHEST - 2 VIEW COMPARISON:  06/26/2019 FINDINGS: Cardiomediastinal contours are stable following median sternotomy and CABG compared to the prior study. The medial  aspect of the left hemidiaphragm is obscured. Airspace disease seen on lateral view. Potentially associated with small left-sided pleural effusion. Visualized skeletal structures are unremarkable aside from lower cervical spinal fusion. IMPRESSION: Left basilar airspace disease and possible small left pleural effusion. Findings may represent pneumonia or atelectasis. Electronically Signed   By: Zetta Bills M.D.   On: 06/28/2019 14:52   DG Chest Port 1 View  Result Date: 06/26/2019 CLINICAL DATA:  Short of breath, tachycardia EXAM: PORTABLE CHEST 1 VIEW COMPARISON:  05/08/2016 FINDINGS: Single frontal view of the chest demonstrates mild enlargement the cardiac silhouette. 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 There is mild central vascular congestion. Left lower lobe consolidation is noted. No effusion or pneumothorax. IMPRESSION: 1. Left lower lobe consolidation which may reflect atelectasis or airspace disease. 2. Mild central vascular congestion. Electronically Signed   By: Randa Ngo M.D.   On: 06/26/2019 22:02   ECHOCARDIOGRAM COMPLETE  Result Date: 06/29/2019    ECHOCARDIOGRAM REPORT   Patient Name:   XZAVIEN HARADA Sr. Date of Exam: 06/29/2019 Medical Rec #:  [id number]          Height:       71.0 in Accession #:    [id number]         Weight:       216.5 lb Date of Birth:  [birth year]         BSA:          2.181 m Patient Age:    31 years           BP:           122/96 mmHg Patient Gender: M                  HR:           101 bpm. Exam Location:  [facility] Procedure: 2D Echo Indications:     BACTEREMIA 790.7/R78.81  History:         Patient has prior history of Echocardiogram examinations, most                  recent 07/26/2015. CAD, Arrythmias:Atrial Fibrillation; Risk                  Factors:Dyslipidemia, Hypertension and Diabetes.  Sonographer:     Avanell Shackleton Referring Phys:  [id number] [place] Diagnosing Phys: Ida Rogue MD IMPRESSIONS  1. Left ventricular ejection fraction, by  estimation, is 40 to 45%. The left ventricle has mildly decreased function. The left ventricle demonstrates global hypokinesis. The left ventricular internal cavity size was moderately dilated. Left ventricular diastolic parameters are indeterminate.  2. Right ventricular systolic function is mildly reduced. The right ventricular size is mildly enlarged. There is moderately elevated pulmonary artery systolic pressure.  3. Left atrial size was mildly dilated.  4. Right atrial size was mildly dilated.  5. The mitral valve is normal in structure and function. Mild to moderate mitral valve regurgitation. No evidence of mitral stenosis.  6. Tricuspid valve regurgitation is mild to moderate.  7. No valve vegetation noted. FINDINGS  Left Ventricle: Left ventricular ejection fraction, by estimation, is 40 to 45%. The left ventricle has mildly decreased function. The left ventricle demonstrates global hypokinesis. The left ventricular internal cavity size was moderately dilated. There is no left ventricular hypertrophy. Left ventricular diastolic parameters are indeterminate. Right Ventricle: The right ventricular size is mildly enlarged. No increase in right ventricular wall thickness. Right ventricular systolic function is mildly reduced. There is moderately elevated pulmonary artery systolic pressure. The tricuspid regurgitant velocity is 3.23 m/s, and with an assumed right atrial pressure of 10 mmHg, the estimated right ventricular systolic pressure is 14.9 mmHg. Left Atrium: Left atrial size was mildly dilated. Right Atrium: Right atrial size was mildly dilated. Pericardium: There is no evidence of pericardial effusion. Mitral Valve: The mitral valve is normal in structure and function. Normal mobility of the mitral valve leaflets. Mild to moderate mitral valve regurgitation. No evidence of mitral valve stenosis. Tricuspid Valve: The tricuspid valve is normal in structure. Tricuspid valve regurgitation is mild to  moderate. No evidence of tricuspid stenosis. Aortic Valve: The aortic valve is normal in structure and function. Aortic valve regurgitation is not visualized. No aortic stenosis is present. Pulmonic Valve: The pulmonic valve was normal in structure. Pulmonic valve regurgitation is not visualized. No evidence of pulmonic stenosis. Aorta: The aortic root is normal in size and structure. Venous: The inferior vena cava is normal in size with greater than 50% respiratory variability, suggesting right atrial pressure of 3 mmHg. IAS/Shunts: No atrial level shunt detected by color flow Doppler.  LEFT VENTRICLE PLAX 2D LVIDd:         4.80 cm LVIDs:         4.00 cm LV PW:         0.94 cm LV IVS:        0.93 cm LVOT diam:     2.20 cm LVOT Area:     3.80 cm  IVC IVC diam: 2.01 cm LEFT ATRIUM             Index       RIGHT ATRIUM           Index LA diam:        4.70 cm 2.16 cm/m  RA Area:     19.20 cm LA Vol (A2C):   80.0 ml 36.68 ml/m RA Volume:   58.30 ml  26.73 ml/m LA Vol (A4C):   62.3 ml 28.57 ml/m LA Biplane Vol: 73.3 ml 33.61 ml/m   AORTA Ao Root diam: 3.30 cm MR Peak grad: 95.1 mmHg   TRICUSPID VALVE MR Mean grad: 62.0 mmHg   TR Peak grad:   41.7 mmHg MR Vmax:      487.50 cm/s TR Vmax:  323.00 cm/s MR Vmean:     374.5 cm/s                           SHUNTS                           Systemic Diam: 2.20 cm Ida Rogue MD Electronically signed by Ida Rogue MD Signature Date/Time: 06/29/2019/3:21:50 PM    Final     Assessment and Plan:   Bacteremia with request for TEE --Request for TEE per ID for MSSA bacteremia. No valve vegetation noted on most recent TTE. Rate not well controlled today and volume overloaded with poor breathing status. Given his volume overload and poor rate control, will need to reassess for TEE tomorrow AM. Would not recommend TEE today. Will make NPO after midnight. We may need to involve anesthesia and/or delay TEE until later in the week and breathing / volume status improved.  Verbally consented for TEE today but will delay scheduling until reassess in AM.  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 He reports worsening LEE and abdominal distention since admission with weights climbing and volume overload noted. Suspect SOB/DOE multifactorial in the setting of PNA, COPD, uncontrolled ventricular rate, volume status s/p IVF with newly reduced EF with LV hypokinesis on most recent echo and elevated pulmonary pressures. IVF currently running in the room. Recommend against further fluids unless needed for medications and due to current volume overload with reduced EF. Wt 207  212lbs  218lbs and +1.5L for the admission. Will order BNP to trend. Recommend gentle diuresis as tolerated. Strict I/Os, daily standing weights. Daily BMET. As below, consider further ischemic workup this admission or as an outpatient recommended given newly reduced EF and with stress testing or LHC after recovery of current illness.  Persistent atrial fibrillation with RVR --Asx in Afib. H/o atrial arrhythmia dates back to s/p bypass surgery in 2014. On amiodarone until 2018 and d/c'd due to side effect and since on BB and [place] with Eliquis. Remains in Afib with rates poorly controlled and home rates usually 80s and someitmes into the 90s. Suspect illness / sepsis, hypokalemia, COPD, and volume overload s/p IVF have contributed to elevated rates this admission.  --Gave an additional metoprolol tartrate 6m this afternoon and increased his BB dose to metoprolol 533mBID as long as BP allows and to allow for more optimal imaging on TEE. Continue to monitor CBC, BMET. Most recent TSH 3.460 and will recheck. As above, recommend against further fluids. Continue to monitor electrolytes. Continue [id number] score of at least 4  (CHF, HTN, agex1, DM2).  CAD --No CP. Reports chronic DOE that worsened in the setting of COPD and current infection. Also reported SOB at rest and chest tightness preceding most recent  admission.  High-sensitivity troponin has been minimally elevated and flat trending and EKG without acute ST or T changes, which is not consistent with ACS.    Given reduced EF on recent echo, as well as LV hypokinesis, recommend further ischemic workup with stress testing +/- catheterization this admission or as an outpatient. For now, continue statin, increased dose BB, and ARB. No ASA in the setting of Eliquis.   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 Caution with fluids.   HTN --BP elevated s/p IVF. Increased BB. Continue ARB. Recommend against further fluids given newly reduced EF and volume overload.  HLD --Continue statin.  DM2 --SSI, Per IM.   COPD --Chronic DOE. Inhalers and breathing treatments per IM.    For questions or updates, please contact [place] Please consult [email or web address] for contact info under     Signed, Arvil Chaco, [name]  06/30/2019 11:28 AM

## 2019-06-30 NOTE — Consult Note (Signed)
Infectious Disease     Reason for Consult: MSSA bacteremia    Referring Physician: Dr Reesa Chew Date of Admission:  06/26/2019   Active Problems:   Chronic obstructive pulmonary disease ([place])   Shock ([place])   Community acquired pneumonia of left lower lobe of lung   Sepsis ([place])   HPI: Philip ILLESCAS Sr. is a 74 y.o. male with a PMH of Type II Diabetes Mellitus, OSA (does not wear CPAP), Shingles, Paroxymal Atrial Fibrillation on Eliquis, Neuropathy, Hypercholesteremia, HOH, Bronchitis, Chronic Diastolic CHF (EF 51-70%), CABG, CAD, COPD, Atrial Tachycardia, Asthma, and Arthritis. Admitted 2/25 with SOB, wheeze, couhg with hemoptysis, fevers and chills. He says he has been feeling poorly for about 2 weeks with SOB and cough. Finally got to point where he could not walk or lay flat with SOB so called EMS. On admit was tachy in afib with RVR, hypoxic and hypotensive. WBC was 17, cr elevated, LA 3.8. CXR LLL consolidation.  Since admit bc + 1/2 MSSA. FU NGTD. Sputum cx pending. WBC decreasing.   Started initially on CTX and azitrho but now on cefazalin. He denies current fevers or chills but sill with cough with thick bloody sputum  He denies any recent skin and soft tissue infections but does have bruising from his anticoagulation.   Past Medical History:  Diagnosis Date  . Arthritis    a. right knee  . Asthma   . Atrial tachycardia ([place])   . 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 etiology.  [name] COPD (chronic obstructive pulmonary disease) ([place])   . Coronary artery disease    a. 05/2012 Cath: severe 3VD-->CABG x 4 by Dr. Servando Snare in 1/2014[place], SVG->LCX & SVG->RPDA; b. 06/2014 MV: no ischemia/infarct.  . Diastolic dysfunction    a. 06/2015 Echo: EF 50-55%, Gr1 DD. Mild MR. Mildly dil LA. Nl RV fxn. Nl PASP.  [name] Edema    LEGS/ FEET  . Essential hypertension   . History of bronchitis   . HOH (hard of hearing)   . Hypercholesteremia   . Neuropathy   . PAF (paroxysmal atrial fibrillation)  (H[facility])    a. CHA2DS2VASc = 4-->Eliquis.  . Shingles   . Sleep apnea    NO CPAP  . Type II diabetes mellitus ([place])    Past Surgical History:  Procedure Laterality Date  . ANTERIOR CERVICAL DECOMP/DISCECTOMY FUSION  ~ 2009  . BACK SURGERY     CERVICAL FUSION  . CARDIAC CATHETERIZATION  05/06/2012   Significant three-vessel coronary artery disease with normal ejection fraction  . CATARACT EXTRACTION W/PHACO Right 08/15/2017   Procedure: CATARACT EXTRACTION PHACO AND INTRAOCULAR LENS PLACEMENT (IOC);  Surgeon: Birder Robson, MD;  Location: [facility];  Service: Ophthalmology;  Laterality: Right;  [place] 00:23 AP% 13.3 CDE 3.11 Fluid pack lot # [id number]  . CATARACT EXTRACTION W/PHACO Left 10/03/2017   Procedure: CATARACT EXTRACTION PHACO AND INTRAOCULAR LENS PLACEMENT (IOC);  Surgeon: Birder Robson, MD;  Location: [facility];  Service: Ophthalmology;  Laterality: Left;  Lot #[id number] H [place]: 00:21.2 AP%:13.9 CDE: 2.96  . CORONARY ANGIOPLASTY    . CORONARY ARTERY BYPASS GRAFT  05/07/2012   Procedure: CORONARY ARTERY BYPASS GRAFTING (CABG);  Surgeon: [name], MD;  Location: [facility];  Service: Open Heart Surgery;  Laterality: N/A;  times three  . INTRAOPERATIVE TRANSESOPHAGEAL ECHOCARDIOGRAM  05/07/2012   Procedure: INTRAOPERATIVE TRANSESOPHAGEAL ECHOCARDIOGRAM;  Surgeon: [name], MD;  Location: [place];  Service: Open Heart Surgery;  Laterality: N/A;  .  KNEE ARTHROSCOPY  ~ 2000   "right" (05/06/2012)  . KNEE SURGERY  2003   right  . NECK SURGERY  2008   Plate in neck   Social History   Tobacco Use  . Smoking status: Former Smoker    Packs/day: 1.00    Years: 8.00    Pack years: 8.00    Types: Cigarettes    Quit date: 12/31/1967    Years since quitting: 51.5  . Smokeless tobacco: Former Network engineer Use Topics  . Alcohol use: No    Alcohol/week: 0.0 standard drinks    Comment: 05/06/2012 "last alcohol several years ago; never had problem wit"  . Drug use: No   Family  History  Problem Relation Age of Onset  . Heart disease Mother   . Clotting disorder Mother   . Hypertension Sister     Allergies:  Allergies  Allergen Reactions  . Bydureon [Exenatide] Anaphylaxis    Possible from bydureon, not 100% confirmed  . Clindamycin/Lincomycin Hives and Itching  . Penicillins Hives, Itching, Rash and Other (See Comments)    Has patient had a PCN reaction causing immediate rash, facial/tongue/throat swelling, SOB or lightheadedness with hypotension: Yes Has patient had a PCN reaction causing severe rash involving mucus membranes or skin necrosis: No Has patient had a PCN reaction that required hospitalization: No Has patient had a PCN reaction occurring within the last 10 years: No If all of the above answers are "NO", then may proceed with Cephalosporin use.  . Sulfa Antibiotics Shortness Of Breath  . Spiriferis Other (See Comments)    Affected breathing  . Spiriva Handihaler [Tiotropium Bromide Monohydrate]     Affected breathing  . Ivp Dye [Iodinated Diagnostic Agents] Rash and Other (See Comments)    Redness    Current antibiotics: Antibiotics Given (last 72 hours)    Date/Time Action Medication Dose Rate   06/27/19 1704 New Bag/Given   vancomycin (VANCOREADY) IVPB 2000 mg/400 mL 2,000 mg 200 mL/hr   06/27/19 1820 New Bag/Given   azithromycin (ZITHROMAX) 500 mg in sodium chloride 0.9 % 250 mL IVPB 500 mg 250 mL/hr   06/28/19 0752 New Bag/Given   cefTRIAXone (ROCEPHIN) 2 g in sodium chloride 0.9 % 100 mL IVPB 2 g 200 mL/hr   06/28/19 1207 New Bag/Given   vancomycin (VANCOREADY) IVPB 1750 mg/350 mL 1,750 mg 175 mL/hr   06/29/19 1233 New Bag/Given   ceFAZolin (ANCEF) IVPB 2g/100 mL premix 2 g 200 mL/hr   06/29/19 1935 New Bag/Given   ceFAZolin (ANCEF) IVPB 2g/100 mL premix 2 g 200 mL/hr   06/30/19 0436 New Bag/Given   ceFAZolin (ANCEF) IVPB 2g/100 mL premix 2 g 200 mL/hr   06/30/19 1230 New Bag/Given   ceFAZolin (ANCEF) IVPB 2g/100 mL premix 2 g  200 mL/hr      MEDICATIONS: . apixaban  5 mg Oral BID  . atorvastatin  40 mg Oral Daily  . budesonide (PULMICORT) nebulizer solution  0.5 mg Nebulization BID  . Chlorhexidine Gluconate Cloth  6 each Topical Daily  . famotidine  20 mg Oral BID  . gabapentin  100 mg Oral TID  . guaiFENesin  600 mg Oral BID  . insulin aspart  0-20 Units Subcutaneous TID WC  . insulin aspart  0-5 Units Subcutaneous QHS  . insulin aspart  6 Units Subcutaneous TID WC  . insulin glargine  10 Units Subcutaneous QHS  . levothyroxine  88 mcg Oral Daily  . metoprolol tartrate  50 mg Oral  BID  . montelukast  10 mg Oral QHS  . phosphorus  500 mg Oral BID  . predniSONE  20 mg Oral Q breakfast  . Ensure Max Protein  11 oz Oral BID    Review of Systems - 11 systems reviewed and negative per HPI   OBJECTIVE: Temp:  [97.8 F (36.6 C)-99.8 F (37.7 C)] 98.1 F (36.7 C) (03/01 1519) Pulse Rate:  [90-106] 99 (03/01 1519) Resp:  [16-20] 17 (03/01 1519) BP: (124-148)/(83-92) 126/88 (03/01 1519) SpO2:  [91 %-97 %] 92 % (03/01 1519) Weight:  [98.9 kg] 98.9 kg (03/01 0519) Physical Exam  Constitutional: He is oriented to person, place, and time. Coughing during exam with thick bloody sputum in cup HENT: anicteric Mouth/Throat: Oropharynx is clear and moist. No oropharyngeal exudate.  Cardiovascular: Distant HS  Pulmonary/Chest: poor air movement,bil rhonchi Abdominal: Soft. Bowel sounds are normal. He exhibits no distension. There is no tenderness.  Lymphadenopathy:  He has no cervical adenopathy.  Neurological: He is alert and oriented to person, place, and time.  Skin: bruising but no wounds or rash Psychiatric: He has a normal mood and affect. His behavior is normal.     LABS: Results for orders placed or performed during the hospital encounter of 06/26/19 (from the past 48 hour(s))  Glucose, capillary     Status: Abnormal   Collection Time: 06/28/19  4:35 PM  Result Value Ref Range    Glucose-Capillary 327 (H) 70 - 99 mg/dL    Comment: Glucose reference range applies only to samples taken after fasting for at least 8 hours.  Glucose, capillary     Status: Abnormal   Collection Time: 06/28/19  9:05 PM  Result Value Ref Range   Glucose-Capillary 237 (H) 70 - 99 mg/dL    Comment: Glucose reference range applies only to samples taken after fasting for at least 8 hours.  Basic metabolic panel     Status: Abnormal   Collection Time: 06/29/19  5:54 AM  Result Value Ref Range   Sodium 137 135 - 145 mmol/L   Potassium 4.1 3.5 - 5.1 mmol/L   Chloride 106 98 - 111 mmol/L   CO2 21 (L) 22 - 32 mmol/L   Glucose, Bld 212 (H) 70 - 99 mg/dL    Comment: Glucose reference range applies only to samples taken after fasting for at least 8 hours.   BUN 30 (H) 8 - 23 mg/dL   Creatinine, Ser 1.07 0.61 - 1.24 mg/dL   Calcium 8.5 (L) 8.9 - 10.3 mg/dL   GFR calc non Af Amer >60 >60 mL/min   GFR calc Af Amer >60 >60 mL/min   Anion gap 10 5 - 15    Comment: Performed at [facility], [address] [place], [place] [address]  Magnesium     Status: Abnormal   Collection Time: 06/29/19  5:54 AM  Result Value Ref Range   Magnesium 2.5 (H) 1.7 - 2.4 mg/dL    Comment: Performed at [facility], [address] [place], [place] [address]  Phosphorus     Status: Abnormal   Collection Time: 06/29/19  5:54 AM  Result Value Ref Range   Phosphorus 2.4 (L) 2.5 - 4.6 mg/dL    Comment: Performed at [facility], [address] [place], [place] [address]  CBC     Status: Abnormal   Collection Time: 06/29/19  5:54 AM  Result Value Ref Range   WBC 17.4 (H) 4.0 - 10.5 K/uL  RBC 4.24 4.22 - 5.81 MIL/uL   Hemoglobin 12.9 (L) 13.0 - 17.0 g/dL   HCT 37.6 (L) 39.0 - 52.0 %   MCV 88.7 80.0 - 100.0 fL   MCH 30.4 26.0 - 34.0 pg   MCHC 34.3 30.0 - 36.0 g/dL   RDW 13.3 11.5 - 15.5 %   Platelets 162 150 - 400 K/uL   nRBC 0.0 0.0 - 0.2 %    Comment: Performed at [facility], [address] [place], [place] [address]  Glucose, capillary     Status: Abnormal   Collection Time: 06/29/19  7:50 AM  Result Value Ref Range   Glucose-Capillary 187 (H) 70 - 99 mg/dL    Comment: Glucose reference range applies only to samples taken after fasting for at least 8 hours.  Glucose, capillary     Status: Abnormal   Collection Time: 06/29/19 11:57 AM  Result Value Ref Range   Glucose-Capillary 227 (H) 70 - 99 mg/dL    Comment: Glucose reference range applies only to samples taken after fasting for at least 8 hours.  Culture, blood (single) w Reflex to ID Panel     Status: None (Preliminary result)   Collection Time: 06/29/19  1:11 PM   Specimen: BLOOD  Result Value Ref Range   Specimen Description BLOOD RAC    Special Requests      BOTTLES DRAWN AEROBIC AND ANAEROBIC Blood Culture results may not be optimal due to an excessive volume of blood received in culture bottles   Culture      NO GROWTH < 24 HOURS Performed at [facility], [address], [place], [place] [address]    Report Status PENDING   Glucose, capillary     Status: Abnormal   Collection Time: 06/29/19  4:22 PM  Result Value Ref Range   Glucose-Capillary 261 (H) 70 - 99 mg/dL    Comment: Glucose reference range applies only to samples taken after fasting for at least 8 hours.  Glucose, capillary     Status: Abnormal   Collection Time: 06/29/19  9:09 PM  Result Value Ref Range   Glucose-Capillary 326 (H) 70 - 99 mg/dL    Comment: Glucose reference range applies only to samples taken after fasting for at least 8 hours.  Basic metabolic panel     Status: Abnormal   Collection Time: 06/30/19  4:48 AM  Result Value Ref Range   Sodium 141 135 - 145 mmol/L   Potassium 4.2 3.5 - 5.1 mmol/L   Chloride 108 98 - 111 mmol/L   CO2 26 22 - 32 mmol/L   Glucose, Bld 137 (H) 70 - 99 mg/dL    Comment: Glucose reference range applies only to samples taken after fasting for at least 8 hours.    BUN 27 (H) 8 - 23 mg/dL   Creatinine, Ser 0.92 0.61 - 1.24 mg/dL   Calcium 8.4 (L) 8.9 - 10.3 mg/dL   GFR calc non Af Amer >60 >60 mL/min   GFR calc Af Amer >60 >60 mL/min   Anion gap 7 5 - 15    Comment: Performed at [facility], [address] [place], [place] [address]  Phosphorus     Status: None   Collection Time: 06/30/19  4:48 AM  Result Value Ref Range   Phosphorus 2.6 2.5 - 4.6 mg/dL    Comment: Performed at [facility], [address] [place], [place] [address]  CBC     Status:  Abnormal   Collection Time: 06/30/19  4:48 AM  Result Value Ref Range   WBC 15.5 (H) 4.0 - 10.5 K/uL   RBC 4.05 (L) 4.22 - 5.81 MIL/uL   Hemoglobin 12.6 (L) 13.0 - 17.0 g/dL   HCT 35.6 (L) 39.0 - 52.0 %   MCV 87.9 80.0 - 100.0 fL   MCH 31.1 26.0 - 34.0 pg   MCHC 35.4 30.0 - 36.0 g/dL   RDW 13.3 11.5 - 15.5 %   Platelets 168 150 - 400 K/uL   nRBC 0.0 0.0 - 0.2 %    Comment: Performed at [facility], [address] [place], [place] [address]  Brain natriuretic peptide     Status: Abnormal   Collection Time: 06/30/19  4:48 AM  Result Value Ref Range   B Natriuretic Peptide 857.0 (H) 0.0 - 100.0 pg/mL    Comment: Performed at [facility], [address] [place], [place] [address]  TSH     Status: None   Collection Time: 06/30/19  4:48 AM  Result Value Ref Range   TSH 2.676 0.350 - 4.500 uIU/mL    Comment: Performed by a 3rd Generation assay with a functional sensitivity of <=0.01 uIU/mL. Performed at [facility], [address] [place], [place] [address]   Magnesium     Status: None   Collection Time: 06/30/19  4:48 AM  Result Value Ref Range   Magnesium 2.3 1.7 - 2.4 mg/dL    Comment: Performed at [facility], [address] [place], [place] [address]  Glucose, capillary     Status: Abnormal   Collection Time: 06/30/19  7:22 AM  Result Value Ref Range   Glucose-Capillary 127 (H) 70 - 99 mg/dL    Comment: Glucose  reference range applies only to samples taken after fasting for at least 8 hours.  Glucose, capillary     Status: Abnormal   Collection Time: 06/30/19 11:49 AM  Result Value Ref Range   Glucose-Capillary 145 (H) 70 - 99 mg/dL    Comment: Glucose reference range applies only to samples taken after fasting for at least 8 hours.   No components found for: ESR, C REACTIVE PROTEIN MICRO: Recent Results (from the past 720 hour(s))  Blood Culture (routine x 2)     Status: None (Preliminary result)   Collection Time: 06/26/19  9:45 PM   Specimen: BLOOD  Result Value Ref Range Status   Specimen Description BLOOD LEFT HAND  Final   Special Requests   Final    BOTTLES DRAWN AEROBIC AND ANAEROBIC Blood Culture adequate volume   Culture   Final    NO GROWTH 4 DAYS Performed at [facility], [address], [place], [place] [address]    Report Status PENDING  Incomplete  Blood Culture (routine x 2)     Status: Abnormal   Collection Time: 06/26/19  9:58 PM   Specimen: BLOOD  Result Value Ref Range Status   Specimen Description   Final    BLOOD BLOOD RIGHT HAND Performed at [facility], [address], [place], [place] [address]    Special Requests   Final    BOTTLES DRAWN AEROBIC AND ANAEROBIC Blood Culture adequate volume Performed at [facility], [address] [place], [place] [address]    Culture  Setup Time   Final    GRAM POSITIVE COCCI AEROBIC BOTTLE ONLY CRITICAL RESULT CALLED TO, READ BACK BY AND VERIFIED WITH: KEYSHAWN PATEL 06/27/19 @  [place] at [facility], [address] [place], [place] [address]    Culture STAPHYLOCOCCUS AUREUS (A)  Final   Report Status 06/29/2019 FINAL  Final   Organism ID, Bacteria STAPHYLOCOCCUS AUREUS  Final      Susceptibility   Staphylococcus aureus - MIC*    CIPROFLOXACIN >=8 RESISTANT Resistant     ERYTHROMYCIN 0.5 SENSITIVE Sensitive     GENTAMICIN <=0.5 SENSITIVE Sensitive      OXACILLIN 0.5 SENSITIVE Sensitive     TETRACYCLINE <=1 SENSITIVE Sensitive     VANCOMYCIN <=0.5 SENSITIVE Sensitive     TRIMETH/SULFA <=10 SENSITIVE Sensitive     CLINDAMYCIN <=0.25 SENSITIVE Sensitive     RIFAMPIN <=0.5 SENSITIVE Sensitive     Inducible Clindamycin NEGATIVE Sensitive     * STAPHYLOCOCCUS AUREUS  Respiratory Panel by RT PCR (Flu A&B, Covid) - Nasopharyngeal Swab     Status: None   Collection Time: 06/26/19 10:49 PM   Specimen: Nasopharyngeal Swab  Result Value Ref Range Status   SARS Coronavirus 2 by RT PCR NEGATIVE NEGATIVE Final    Comment: (NOTE) SARS-CoV-2 target nucleic acids are NOT DETECTED. The SARS-CoV-2 RNA is generally detectable in upper respiratoy specimens during the acute phase of infection. The lowest concentration of SARS-CoV-2 viral copies this assay can detect is 131 copies/mL. A negative result does not preclude SARS-Cov-2 infection and should not be used as the sole basis for treatment or other patient management decisions. A negative result may occur with  improper specimen collection/handling, submission of specimen other than nasopharyngeal swab, presence of viral mutation(s) within the areas targeted by this assay, and inadequate number of viral copies (<131 copies/mL). A negative result must be combined with clinical observations, patient history, and epidemiological information. The expected result is Negative. Fact Sheet for Patients:  PinkCheek.be Fact Sheet for Healthcare Providers:  GravelBags.it This test is not yet ap proved or cleared by the [place] FDA and  has been authorized for detection and/or diagnosis of SARS-CoV-2 by FDA under an Emergency Use Authorization (EUA). This EUA will remain  in effect (meaning this test can be used) for the duration of the COVID-19 declaration under Section 564(b)(1) of the Act, 21 U.S.C. section 360bbb-3(b)(1), unless the  authorization is terminated or revoked sooner.    Influenza A by PCR NEGATIVE NEGATIVE Final   Influenza B by PCR NEGATIVE NEGATIVE Final    Comment: (NOTE) The Xpert Xpress SARS-CoV-2/FLU/RSV assay is intended as an aid in  the diagnosis of influenza from Nasopharyngeal swab specimens and  should not be used as a sole basis for treatment. Nasal washings and  aspirates are unacceptable for Xpert Xpress SARS-CoV-2/FLU/RSV  testing. Fact Sheet for Patients: PinkCheek.be Fact Sheet for Healthcare Providers: GravelBags.it This test is not yet approved or cleared by the [place] FDA and  has been authorized for detection and/or diagnosis of SARS-CoV-2 by  FDA under an Emergency Use Authorization (EUA). This EUA will remain  in effect (meaning this test can be used) for the duration of the  Covid-19 declaration under Section 564(b)(1) of the Act, 21  U.S.C. section 360bbb-3(b)(1), unless the authorization is  terminated or revoked. Performed at [facility], [address] [place], [place] [address]   Urine culture     Status: None   Collection Time: 06/27/19  1:30 AM   Specimen: In/Out Cath Urine  Result Value Ref Range Status   Specimen Description   Final    IN/OUT CATH  URINE Performed at [facility], [address] [place], [place] [address]    Special Requests   Final    NONE Performed at [facility], [address] [place], [place] [address]    Culture   Final    NO GROWTH Performed at [facility], [place] [address] [place], [place] [address]    Report Status 06/28/2019 FINAL  Final  Culture, sputum-assessment     Status: None   Collection Time: 06/27/19  1:30 AM   Specimen: Expectorated Sputum  Result Value Ref Range Status   Specimen Description EXPECTORATED SPUTUM  Final   Special Requests NONE  Final   Sputum evaluation   Final    THIS SPECIMEN IS ACCEPTABLE FOR  SPUTUM CULTURE Performed at [facility], [address] [place], [place] [address]    Report Status 06/27/2019 FINAL  Final  MRSA PCR Screening     Status: None   Collection Time: 06/27/19  1:30 AM   Specimen: Nasal Mucosa; Nasopharyngeal  Result Value Ref Range Status   MRSA by PCR NEGATIVE NEGATIVE Final    Comment:        The GeneXpert MRSA Assay (FDA approved for NASAL specimens only), is one component of a comprehensive MRSA colonization surveillance program. It is not intended to diagnose MRSA infection nor to guide or monitor treatment for MRSA infections. Performed at [facility], [address] [place], [place] [address]   Culture, respiratory     Status: None (Preliminary result)   Collection Time: 06/27/19  1:30 AM  Result Value Ref Range Status   Specimen Description   Final    EXPECTORATED SPUTUM Performed at [facility], [address] [place], [place] [address]    Special Requests   Final    NONE Reflexed from [id number] Performed at [facility], [place], [place] [address]    Gram Stain   Final    ABUNDANT WBC PRESENT,BOTH PMN AND MONONUCLEAR MODERATE GRAM POSITIVE COCCI FEW GRAM POSITIVE RODS RARE GRAM NEGATIVE RODS RARE YEAST MODERATE SQUAMOUS EPITHELIAL CELLS PRESENT    Culture   Final    CULTURE REINCUBATED FOR BETTER GROWTH Performed at [facility], [place] [address] [place], [place] [address]    Report Status PENDING  Incomplete  Group A Strep by PCR     Status: None   Collection Time: 06/27/19  2:11 AM   Specimen: Throat; Sterile Swab  Result Value Ref Range Status   Group A Strep by PCR NOT DETECTED NOT DETECTED Final    Comment: Performed at [facility], [address] [place], [place] [address]  Culture, blood (single) w Reflex to ID Panel     Status: None (Preliminary result)   Collection Time: 06/29/19  1:11 PM   Specimen: BLOOD  Result Value Ref Range Status    Specimen Description BLOOD RAC  Final   Special Requests   Final    BOTTLES DRAWN AEROBIC AND ANAEROBIC Blood Culture results may not be optimal due to an excessive volume of blood received in culture bottles   Culture   Final    NO GROWTH < 24 HOURS Performed at [facility], [address] [place], [place] [address]    Report Status PENDING  Incomplete    IMAGING: DG Chest 2 View  Result Date: 06/28/2019 CLINICAL DATA:  Sepsis and shortness of breath. EXAM: CHEST - 2 VIEW COMPARISON:  06/26/2019 FINDINGS:  Cardiomediastinal contours are stable following median sternotomy and CABG compared to the prior study. The medial aspect of the left hemidiaphragm is obscured. Airspace disease seen on lateral view. Potentially associated with small left-sided pleural effusion. Visualized skeletal structures are unremarkable aside from lower cervical spinal fusion. IMPRESSION: Left basilar airspace disease and possible small left pleural effusion. Findings may represent pneumonia or atelectasis. Electronically Signed   By: Zetta Bills M.D.   On: 06/28/2019 14:52   DG Chest Port 1 View  Result Date: 06/26/2019 CLINICAL DATA:  Short of breath, tachycardia EXAM: PORTABLE CHEST 1 VIEW COMPARISON:  05/08/2016 FINDINGS: Single frontal view of the chest demonstrates mild enlargement the cardiac silhouette. 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 There is mild central vascular congestion. Left lower lobe consolidation is noted. No effusion or pneumothorax. IMPRESSION: 1. Left lower lobe consolidation which may reflect atelectasis or airspace disease. 2. Mild central vascular congestion. Electronically Signed   By: Randa Ngo M.D.   On: 06/26/2019 22:02   ECHOCARDIOGRAM COMPLETE  Result Date: 06/29/2019    ECHOCARDIOGRAM REPORT   Patient Name:   Philip VARADY Sr. Date of Exam: 06/29/2019 Medical Rec #:  [id number]          Height:       71.0 in Accession #:    [id number]         Weight:       216.5 lb  Date of Birth:  11-08-1945         BSA:          2.181 m Patient Age:    79 years           BP:           122/96 mmHg Patient Gender: M                  HR:           101 bpm. Exam Location:  [facility] Procedure: 2D Echo Indications:     BACTEREMIA 790.7/R78.81  History:         Patient has prior history of Echocardiogram examinations, most                  recent 07/26/2015. CAD, Arrythmias:Atrial Fibrillation; Risk                  Factors:Dyslipidemia, Hypertension and Diabetes.  Sonographer:     Avanell Shackleton Referring Phys:  [id number] VZCHYIF AMIN Diagnosing Phys: Ida Rogue MD IMPRESSIONS  1. Left ventricular ejection fraction, by estimation, is 40 to 45%. The left ventricle has mildly decreased function. The left ventricle demonstrates global hypokinesis. The left ventricular internal cavity size was moderately dilated. Left ventricular diastolic parameters are indeterminate.  2. Right ventricular systolic function is mildly reduced. The right ventricular size is mildly enlarged. There is moderately elevated pulmonary artery systolic pressure.  3. Left atrial size was mildly dilated.  4. Right atrial size was mildly dilated.  5. The mitral valve is normal in structure and function. Mild to moderate mitral valve regurgitation. No evidence of mitral stenosis.  6. Tricuspid valve regurgitation is mild to moderate.  7. No valve vegetation noted. FINDINGS  Left Ventricle: Left ventricular ejection fraction, by estimation, is 40 to 45%. The left ventricle has mildly decreased function. The left ventricle demonstrates global hypokinesis. The left ventricular internal cavity size was moderately dilated. There is no left ventricular hypertrophy. Left ventricular diastolic parameters are indeterminate. Right Ventricle: The right ventricular size is  mildly enlarged. No increase in right ventricular wall thickness. Right ventricular systolic function is mildly reduced. There is moderately elevated pulmonary artery systolic  pressure. The tricuspid regurgitant velocity is 3.23 m/s, and with an assumed right atrial pressure of 10 mmHg, the estimated right ventricular systolic pressure is 03.5 mmHg. Left Atrium: Left atrial size was mildly dilated. Right Atrium: Right atrial size was mildly dilated. Pericardium: There is no evidence of pericardial effusion. Mitral Valve: The mitral valve is normal in structure and function. Normal mobility of the mitral valve leaflets. Mild to moderate mitral valve regurgitation. No evidence of mitral valve stenosis. Tricuspid Valve: The tricuspid valve is normal in structure. Tricuspid valve regurgitation is mild to moderate. No evidence of tricuspid stenosis. Aortic Valve: The aortic valve is normal in structure and function. Aortic valve regurgitation is not visualized. No aortic stenosis is present. Pulmonic Valve: The pulmonic valve was normal in structure. Pulmonic valve regurgitation is not visualized. No evidence of pulmonic stenosis. Aorta: The aortic root is normal in size and structure. Venous: The inferior vena cava is normal in size with greater than 50% respiratory variability, suggesting right atrial pressure of 3 mmHg. IAS/Shunts: No atrial level shunt detected by color flow Doppler.  LEFT VENTRICLE PLAX 2D LVIDd:         4.80 cm LVIDs:         4.00 cm LV PW:         0.94 cm LV IVS:        0.93 cm LVOT diam:     2.20 cm LVOT Area:     3.80 cm  IVC IVC diam: 2.01 cm LEFT ATRIUM             Index       RIGHT ATRIUM           Index LA diam:        4.70 cm 2.16 cm/m  RA Area:     19.20 cm LA Vol (A2C):   80.0 ml 36.68 ml/m RA Volume:   58.30 ml  26.73 ml/m LA Vol (A4C):   62.3 ml 28.57 ml/m LA Biplane Vol: 73.3 ml 33.61 ml/m   AORTA Ao Root diam: 3.30 cm MR Peak grad: 95.1 mmHg   TRICUSPID VALVE MR Mean grad: 62.0 mmHg   TR Peak grad:   41.7 mmHg MR Vmax:      487.50 cm/s TR Vmax:        323.00 cm/s MR Vmean:     374.5 cm/s                           SHUNTS                            Systemic Diam: 2.20 cm Ida Rogue MD Electronically signed by Ida Rogue MD Signature Date/Time: 06/29/2019/3:21:50 PM    Final     Assessment:   Philip Potters Sr. is a 74 y.o. male with MMP including COPD, CHF, A fib, DM CAD admitted with 2 week resp illness and progressive SOB with purulent then bloody sputum. CXR with LLL opacity. BCX + MSSA.  TTE neg for veg. No obvious source of MSSA bacteremia unless he had CAP from MSSA which is unusual but does occur. No obvious metastatic sites of infection. Has no prosthetic joints or intravascular devices but does have hardware in C spine following surgery in past. No  current back or neck pain. No joint swelling or pain.   Recommendations Cont cefazolin. Check TEE. Sputum cx pending.  If worsening resp status would consider CT chest but currently seems to be improving. Once bcx neg x 48 hours will need PICC line or midline for min 2 weeks IV abx. If TEE negative, blood cultures clear rapidly, has good clinical response and no sites of metastatic infection, could consider short 2 week course.  Thank you very much for allowing me to participate in the care of this patient. Please call with questions.   Cheral Marker. Ola Spurr, MD

## 2019-06-30 NOTE — Progress Notes (Signed)
    [facility] has been requested to perform a transesophageal echocardiogram on [name]. for MSSA bacteremia.  After careful review of history and examination, the risks and benefits of transesophageal echocardiogram have been explained including risks of esophageal damage, perforation (1:10,000 risk), bleeding, pharyngeal hematoma as well as other potential complications associated with conscious sedation including aspiration, arrhythmia, respiratory failure and death. Alternatives to treatment were discussed, questions were answered. Patient is willing to proceed.   Of note, as in consult note, we will need to reassess in the AM given his ventricular rates and breathing status this AM.  Arvil Chaco, [name] 06/30/2019 1:40 PM

## 2019-06-30 NOTE — Progress Notes (Signed)
PROGRESS NOTE    Philip Potters Sr.  [id number] DOB: 1946-01-25 DOA: 06/26/2019 PCP: [name], DO   Brief Narrative:  This is a 74 yo male with a PMH of Type II Diabetes Mellitus, OSA (does not wear CPAP), Shingles, Paroxymal Atrial Fibrillation on Eliquis, Neuropathy, Hypercholesteremia, HOH, Bronchitis, Chronic Diastolic CHF (EF [id number]), CABG, CAD, COPD, Atrial Tachycardia, Asthma, and Arthritis.  He presented to [facility] ER on 02/25 via EMS with shortness of breath onset 02/25.  He also endorsed chills, shortness of breath, and a fever. He also states he returned to work this week (he works for [employer]). Upon EMS arrival he received solumedrol and duoneb's x2.  It was also noted cardiac rhythm atrial fibrillation with rvr hr 140's bpm. Influenza PCR/COVID-19 negative, however CXR concerning mild vascular congestion and LLL consolidation vs. Atelectasis. Sepsis protocol initiated pt received 1L LR bolus, 2L NS bolus, cefepime, solumedrol, azithromycin, and duoneb treatments.  Pt initially admitted to the medsurg unit per hospitalist team for additional workup and treatment.  However, he later developed hypotension requiring levophed gtt and transfer to ICU. Transferred out of ICU on 06/28/2019.  Blood pressure stable without levo.  Aerobic blood culture bottles are growing gram-positive cocci and vancomycin was added to ceftriaxone and azithromycin.  Subjective: Patient continued to experience exertional dyspnea and generalized weakness.  Assessment & Plan:   Active Problems:   Chronic obstructive pulmonary disease ([place])   Shock ([place])   Community acquired pneumonia of left lower lobe of lung   Sepsis ([place])  Acute on chronic respiratory failure secondary to CAP and AECOPD  Hx: Bronchitis and Asthma.  Improving.  He was saturating well on room air.  No wheezing.  He was given higher doses of IV Solu-Medrol with breathing treatment. Pulmonary is  following-appreciate their recommendations. -Start him on Pulmicort. -Transitioned IV Solu-Medrol to p.o. prednisone-continue prednisone. -Continue to monitor.  Hypotension secondary to septic shock due to MSSA bacteremia. Resolved.  Patient was normotensive now. -Blood cultures growing MSSA -Narrow antibiotics to cefazolin. -TTE is without any vegetation but depressed EF as compared to prior. -Cardiology was consulted for newly decreased EF and TEE.  New diagnosis of systolic heart failure.  EF at 40 to 45% which is new as compared to prior echo done in 2017.  Patient also having exertional dyspnea.  Denies any orthopnea or PND.  Did receive fluid due to sepsis earlier. Patient has an history of diastolic dysfunction and was taking ACE inhibitor and Lasix at home.  Currently holding due to hypotension on presentation. -Cardiology was consulted-might get ischemic work-up during current hospitalization. -Checking BNP-might need gentle diuresis or restarting of home dose of Lasix, we will let cardiology decide upon that. -Can restart home dose of ACE inhibitor if blood pressure allows.  Atrial fibrillation with RVR.  Patient remained in A. fib with elevated heart rate. -Increase beta-blocker to 50 mg twice daily per cardiology and monitor. -Continue home dose of Eliquis.   Hypercholesterolemia. -Restart home dose of Lipitor.  Essential hypertension.  Currently normotensive without any pressor. -Restart home dose of beta-blocker. -Can restart home dose of ACE inhibitor if blood pressure allows.  Acute on chronic renal failure.  Resolved.  Secondary to sepsis, hypotension and lactic acidosis.  -Continue to monitor renal function. -Avoid nephrotoxins.  Type II diabetes mellitus.  Uncontrolled with A1c of 7.9.  CBG elevated as patient is on steroid, started improving. -Continue Lantus 10 units at bedtime along with mealtime coverage. -Continue  to monitor-anticipated improvement as steroid  dose has been decreased. -Continue SSI  Objective: Vitals:   06/30/19 0519 06/30/19 0722 06/30/19 1130 06/30/19 1150  BP: 133/88 (!) 148/92  124/84  Pulse: 90 98  91  Resp: 20 18  19   Temp: 97.8 F (36.6 C) 98.2 F (36.8 C)  99.8 F (37.7 C)  TempSrc: Oral Oral  Oral  SpO2: 95% 95% 91% 93%  Weight: 98.9 kg     Height:        Intake/Output Summary (Last 24 hours) at 06/30/2019 1409 Last data filed at 06/30/2019 1230 Gross per 24 hour  Intake 837.81 ml  Output 2750 ml  Net -1912.19 ml   Filed Weights   06/28/19 0500 06/29/19 0500 06/30/19 0519  Weight: 96.6 kg 98.2 kg 98.9 kg    Examination:  General exam: Appears calm and comfortable  Respiratory system: Scattered crackles bilaterally. Respiratory effort normal. Cardiovascular system: Irregularly irregular, no JVD, murmurs, rubs, gallops or clicks. Gastrointestinal system: Soft, nontender, nondistended, bowel sounds positive. Central nervous system: Alert and oriented. No focal neurological deficits.Symmetric 5 x 5 power. Extremities: No LE edema, no cyanosis, pulses intact and symmetrical. Skin: No rashes, lesions or ulcers Psychiatry: Judgement and insight appear normal. Mood & affect appropriate.    DVT prophylaxis: Eliquis Code Status: Full Family Communication: Daughter was updated on phone. Disposition Plan: Pending improvement.  Most likely will go back home.  Consultants:   PCCM  ID  Cardiology  Procedures:   Antimicrobials:  Cefazolin  Data Reviewed: I have personally reviewed following labs and imaging studies  CBC: Recent Labs  Lab 06/26/19 2145 06/27/19 0508 06/28/19 0425 06/29/19 0554 06/30/19 0448  WBC 4.0 17.4* 12.8* 17.4* 15.5*  NEUTROABS 2.9 14.9*  --   --   --   HGB 13.9 13.5 12.6* 12.9* 12.6*  HCT 40.4 39.6 36.6* 37.6* 35.6*  MCV 89.0 91.0 88.8 88.7 87.9  PLT 162 216 141* 162 [id number]   Basic Metabolic Panel: Recent Labs  Lab 06/26/19 2224 06/27/19 0150 06/27/19 0508  06/27/19 0739 06/28/19 0425 06/29/19 0554 06/30/19 0448  NA 140  --  141  --  139 137 141  K 3.2*  --  4.5  --  4.4 4.1 4.2  CL 110  --  111  --  109 106 108  CO2 21*  --  19*  --  24 21* 26  GLUCOSE 194*  --  255*  --  244* 212* 137*  BUN 24*  --  26*  --  32* 30* 27*  CREATININE 1.44*  --  1.52*  --  1.25* 1.07 0.92  CALCIUM 7.9*  --  7.8*  --  8.2* 8.5* 8.4*  MG  --  1.0*  --  2.5* 2.4 2.5*  --   PHOS  --   --  2.0*  --  2.1* 2.4* 2.6   GFR: Estimated Creatinine Clearance: 87 mL/min (by C-G formula based on SCr of 0.92 mg/dL). Liver Function Tests: Recent Labs  Lab 06/26/19 2224  AST 33  ALT 24  ALKPHOS 80  BILITOT 1.0  PROT 4.9*  ALBUMIN 3.3*   No results for input(s): LIPASE, AMYLASE in the last 168 hours. No results for input(s): AMMONIA in the last 168 hours. Coagulation Profile: Recent Labs  Lab 06/26/19 2145  INR 1.6*   Cardiac Enzymes: No results for input(s): CKTOTAL, CKMB, CKMBINDEX, TROPONINI in the last 168 hours. BNP (last 3 results) No results for input(s): PROBNP in the last  8760 hours. HbA1C: No results for input(s): HGBA1C in the last 72 hours. CBG: Recent Labs  Lab 06/29/19 1157 06/29/19 1622 06/29/19 2109 06/30/19 0722 06/30/19 1149  GLUCAP 227* 261* 326* 127* 145*   Lipid Profile: No results for input(s): CHOL, HDL, LDLCALC, TRIG, CHOLHDL, LDLDIRECT in the last 72 hours. Thyroid Function Tests: No results for input(s): TSH, T4TOTAL, FREET4, T3FREE, THYROIDAB in the last 72 hours. Anemia Panel: No results for input(s): VITAMINB12, FOLATE, FERRITIN, TIBC, IRON, RETICCTPCT in the last 72 hours. Sepsis Labs: Recent Labs  Lab 06/26/19 2224 06/26/19 2336 06/27/19 0150 06/27/19 0508 06/28/19 0425  PROCALCITON 7.19  --  22.84 26.84 18.23  LATICACIDVEN 2.6* 2.9* 3.8*  --   --     Recent Results (from the past 240 hour(s))  Blood Culture (routine x 2)     Status: None (Preliminary result)   Collection Time: 06/26/19  9:45 PM    Specimen: BLOOD  Result Value Ref Range Status   Specimen Description BLOOD LEFT HAND  Final   Special Requests   Final    BOTTLES DRAWN AEROBIC AND ANAEROBIC Blood Culture adequate volume   Culture   Final    NO GROWTH 4 DAYS Performed at [facility], [address], [place], [place] [address]    Report Status PENDING  Incomplete  Blood Culture (routine x 2)     Status: Abnormal   Collection Time: 06/26/19  9:58 PM   Specimen: BLOOD  Result Value Ref Range Status   Specimen Description   Final    BLOOD BLOOD RIGHT HAND Performed at [facility], [address] [place], [place] [address]    Special Requests   Final    BOTTLES DRAWN AEROBIC AND ANAEROBIC Blood Culture adequate volume Performed at [facility], [address] [place], [place] [address]    Culture  Setup Time   Final    GRAM POSITIVE COCCI AEROBIC BOTTLE ONLY CRITICAL RESULT CALLED TO, READ BACK BY AND VERIFIED WITH: KEYSHAWN PATEL 06/27/19 @ 1400  Green Performed at [facility], [address] [place], [place] [address]    Culture STAPHYLOCOCCUS AUREUS (A)  Final   Report Status 06/29/2019 FINAL  Final   Organism ID, Bacteria STAPHYLOCOCCUS AUREUS  Final      Susceptibility   Staphylococcus aureus - MIC*    CIPROFLOXACIN >=8 RESISTANT Resistant     ERYTHROMYCIN 0.5 SENSITIVE Sensitive     GENTAMICIN <=0.5 SENSITIVE Sensitive     OXACILLIN 0.5 SENSITIVE Sensitive     TETRACYCLINE <=1 SENSITIVE Sensitive     VANCOMYCIN <=0.5 SENSITIVE Sensitive     TRIMETH/SULFA <=10 SENSITIVE Sensitive     CLINDAMYCIN <=0.25 SENSITIVE Sensitive     RIFAMPIN <=0.5 SENSITIVE Sensitive     Inducible Clindamycin NEGATIVE Sensitive     * STAPHYLOCOCCUS AUREUS  Respiratory Panel by RT PCR (Flu A&B, Covid) - Nasopharyngeal Swab     Status: None   Collection Time: 06/26/19 10:49 PM   Specimen: Nasopharyngeal Swab  Result Value Ref Range Status   SARS Coronavirus 2 by RT PCR NEGATIVE  NEGATIVE Final    Comment: (NOTE) SARS-CoV-2 target nucleic acids are NOT DETECTED. The SARS-CoV-2 RNA is generally detectable in upper respiratoy specimens during the acute phase of infection. The lowest concentration of SARS-CoV-2 viral copies this assay can detect is 131 copies/mL. A negative result does not preclude SARS-Cov-2 infection and should not be used as the sole basis for treatment or other patient  management decisions. A negative result may occur with  improper specimen collection/handling, submission of specimen other than nasopharyngeal swab, presence of viral mutation(s) within the areas targeted by this assay, and inadequate number of viral copies (<131 copies/mL). A negative result must be combined with clinical observations, patient history, and epidemiological information. The expected result is Negative. Fact Sheet for Patients:  PinkCheek.be Fact Sheet for Healthcare Providers:  GravelBags.it This test is not yet ap proved or cleared by the [place] FDA and  has been authorized for detection and/or diagnosis of SARS-CoV-2 by FDA under an Emergency Use Authorization (EUA). This EUA will remain  in effect (meaning this test can be used) for the duration of the COVID-19 declaration under Section 564(b)(1) of the Act, 21 U.S.C. section 360bbb-3(b)(1), unless the authorization is terminated or revoked sooner.    Influenza A by PCR NEGATIVE NEGATIVE Final   Influenza B by PCR NEGATIVE NEGATIVE Final    Comment: (NOTE) The Xpert Xpress SARS-CoV-2/FLU/RSV assay is intended as an aid in  the diagnosis of influenza from Nasopharyngeal swab specimens and  should not be used as a sole basis for treatment. Nasal washings and  aspirates are unacceptable for Xpert Xpress SARS-CoV-2/FLU/RSV  testing. Fact Sheet for Patients: PinkCheek.be Fact Sheet for Healthcare  Providers: GravelBags.it This test is not yet approved or cleared by the [place] FDA and  has been authorized for detection and/or diagnosis of SARS-CoV-2 by  FDA under an Emergency Use Authorization (EUA). This EUA will remain  in effect (meaning this test can be used) for the duration of the  Covid-19 declaration under Section 564(b)(1) of the Act, 21  U.S.C. section 360bbb-3(b)(1), unless the authorization is  terminated or revoked. Performed at [facility], [address], [place], [place] [address]   Urine culture     Status: None   Collection Time: 06/27/19  1:30 AM   Specimen: In/Out Cath Urine  Result Value Ref Range Status   Specimen Description   Final    IN/OUT CATH URINE Performed at [facility], [address] [place], [place] [address]    Special Requests   Final    NONE Performed at [facility], [address], [place], [place] [address]    Culture   Final    NO GROWTH Performed at [facility], [place] [address] [place], [place] [address]    Report Status 06/28/2019 FINAL  Final  Culture, sputum-assessment     Status: None   Collection Time: 06/27/19  1:30 AM   Specimen: Expectorated Sputum  Result Value Ref Range Status   Specimen Description EXPECTORATED SPUTUM  Final   Special Requests NONE  Final   Sputum evaluation   Final    THIS SPECIMEN IS ACCEPTABLE FOR SPUTUM CULTURE Performed at [facility], [address] [place], [place] [address]    Report Status 06/27/2019 FINAL  Final  MRSA PCR Screening     Status: None   Collection Time: 06/27/19  1:30 AM   Specimen: Nasal Mucosa; Nasopharyngeal  Result Value Ref Range Status   MRSA by PCR NEGATIVE NEGATIVE Final    Comment:        The GeneXpert MRSA Assay (FDA approved for NASAL specimens only), is one component of a comprehensive MRSA colonization surveillance program. It is not intended to diagnose MRSA infection  nor to guide or monitor treatment for MRSA infections. Performed at [facility], [address], [place],  [address]  Culture, respiratory     Status: None (Preliminary result)   Collection Time: 06/27/19  1:30 AM  Result Value Ref Range Status   Specimen Description   Final    EXPECTORATED SPUTUM Performed at [facility], [address], [place], [place] [address]    Special Requests   Final    NONE Reflexed from [id number] Performed at [facility], [place], [place] [address]    Gram Stain   Final    ABUNDANT WBC PRESENT,BOTH PMN AND MONONUCLEAR MODERATE GRAM POSITIVE COCCI FEW GRAM POSITIVE RODS RARE GRAM NEGATIVE RODS RARE YEAST MODERATE SQUAMOUS EPITHELIAL CELLS PRESENT    Culture   Final    CULTURE REINCUBATED FOR BETTER GROWTH Performed at [facility], [place] [address], [place], [place] [address]    Report Status PENDING  Incomplete  Group A Strep by PCR     Status: None   Collection Time: 06/27/19  2:11 AM   Specimen: Throat; Sterile Swab  Result Value Ref Range Status   Group A Strep by PCR NOT DETECTED NOT DETECTED Final    Comment: Performed at [facility], [address] [place], [place] [address]  Culture, blood (single) w Reflex to ID Panel     Status: None (Preliminary result)   Collection Time: 06/29/19  1:11 PM   Specimen: BLOOD  Result Value Ref Range Status   Specimen Description BLOOD RAC  Final   Special Requests   Final    BOTTLES DRAWN AEROBIC AND ANAEROBIC Blood Culture results may not be optimal due to an excessive volume of blood received in culture bottles   Culture   Final    NO GROWTH < 24 HOURS Performed at [facility], [address] [place],  [address]    Report Status PENDING  Incomplete     Radiology Studies: DG Chest 2 View  Result Date: 06/28/2019 CLINICAL DATA:  Sepsis and shortness of breath. EXAM: CHEST - 2 VIEW COMPARISON:  06/26/2019  FINDINGS: Cardiomediastinal contours are stable following median sternotomy and CABG compared to the prior study. The medial aspect of the left hemidiaphragm is obscured. Airspace disease seen on lateral view. Potentially associated with small left-sided pleural effusion. Visualized skeletal structures are unremarkable aside from lower cervical spinal fusion. IMPRESSION: Left basilar airspace disease and possible small left pleural effusion. Findings may represent pneumonia or atelectasis. Electronically Signed   By: Zetta Bills M.D.   On: 06/28/2019 14:52   ECHOCARDIOGRAM COMPLETE  Result Date: 06/29/2019    ECHOCARDIOGRAM REPORT   Patient Name:   Philip CONGO Sr. Date of Exam: 06/29/2019 Medical Rec #:  [id number]          Height:       71.0 in Accession #:    [id number]         Weight:       216.5 lb Date of Birth:  1946-02-16         BSA:          2.181 m Patient Age:    42 years           BP:           122/96 mmHg Patient Gender: M                  HR:           101 bpm. Exam Location:  [facility] Procedure: 2D Echo Indications:  BACTEREMIA 790.7/R78.81  History:         Patient has prior history of Echocardiogram examinations, most                  recent 07/26/2015. CAD, Arrythmias:Atrial Fibrillation; Risk                  Factors:Dyslipidemia, Hypertension and Diabetes.  Sonographer:     Avanell Shackleton Referring Phys:  [id number] [place]en Choe Diagnosing Phys: Ida Rogue MD IMPRESSIONS  1. Left ventricular ejection fraction, by estimation, is 40 to 45%. The left ventricle has mildly decreased function. The left ventricle demonstrates global hypokinesis. The left ventricular internal cavity size was moderately dilated. Left ventricular diastolic parameters are indeterminate.  2. Right ventricular systolic function is mildly reduced. The right ventricular size is mildly enlarged. There is moderately elevated pulmonary artery systolic pressure.  3. Left atrial size was mildly dilated.  4. Right atrial size  was mildly dilated.  5. The mitral valve is normal in structure and function. Mild to moderate mitral valve regurgitation. No evidence of mitral stenosis.  6. Tricuspid valve regurgitation is mild to moderate.  7. No valve vegetation noted. FINDINGS  Left Ventricle: Left ventricular ejection fraction, by estimation, is 40 to 45%. The left ventricle has mildly decreased function. The left ventricle demonstrates global hypokinesis. The left ventricular internal cavity size was moderately dilated. There is no left ventricular hypertrophy. Left ventricular diastolic parameters are indeterminate. Right Ventricle: The right ventricular size is mildly enlarged. No increase in right ventricular wall thickness. Right ventricular systolic function is mildly reduced. There is moderately elevated pulmonary artery systolic pressure. The tricuspid regurgitant velocity is 3.23 m/s, and with an assumed right atrial pressure of 10 mmHg, the estimated right ventricular systolic pressure is [id number] mmHg. Left Atrium: Left atrial size was mildly dilated. Right Atrium: Right atrial size was mildly dilated. Pericardium: There is no evidence of pericardial effusion. Mitral Valve: The mitral valve is normal in structure and function. Normal mobility of the mitral valve leaflets. Mild to moderate mitral valve regurgitation. No evidence of mitral valve stenosis. Tricuspid Valve: The tricuspid valve is normal in structure. Tricuspid valve regurgitation is mild to moderate. No evidence of tricuspid stenosis. Aortic Valve: The aortic valve is normal in structure and function. Aortic valve regurgitation is not visualized. No aortic stenosis is present. Pulmonic Valve: The pulmonic valve was normal in structure. Pulmonic valve regurgitation is not visualized. No evidence of pulmonic stenosis. Aorta: The aortic root is normal in size and structure. Venous: The inferior vena cava is normal in size with greater than 50% respiratory variability,  suggesting right atrial pressure of 3 mmHg. IAS/Shunts: No atrial level shunt detected by color flow Doppler.  LEFT VENTRICLE PLAX 2D LVIDd:         4.80 cm LVIDs:         4.00 cm LV PW:         0.94 cm LV IVS:        0.93 cm LVOT diam:     2.20 cm LVOT Area:     3.80 cm  IVC IVC diam: 2.01 cm LEFT ATRIUM             Index       RIGHT ATRIUM           Index LA diam:        4.70 cm 2.16 cm/m  RA Area:     19.20 cm LA Vol (A2C):  80.0 ml 36.68 ml/m RA Volume:   58.30 ml  26.73 ml/m LA Vol (A4C):   62.3 ml 28.57 ml/m LA Biplane Vol: 73.3 ml 33.61 ml/m   AORTA Ao Root diam: 3.30 cm MR Peak grad: 95.1 mmHg   TRICUSPID VALVE MR Mean grad: 62.0 mmHg   TR Peak grad:   41.7 mmHg MR Vmax:      487.50 cm/s TR Vmax:        323.00 cm/s MR Vmean:     374.5 cm/s                           SHUNTS                           Systemic Diam: 2.20 cm Ida Rogue MD Electronically signed by Ida Rogue MD Signature Date/Time: 06/29/2019/3:21:50 PM    Final     Scheduled Meds: . apixaban  5 mg Oral BID  . atorvastatin  40 mg Oral Daily  . budesonide (PULMICORT) nebulizer solution  0.5 mg Nebulization BID  . Chlorhexidine Gluconate Cloth  6 each Topical Daily  . famotidine  20 mg Oral BID  . gabapentin  100 mg Oral TID  . guaiFENesin  600 mg Oral BID  . insulin aspart  0-20 Units Subcutaneous TID WC  . insulin aspart  0-5 Units Subcutaneous QHS  . insulin aspart  6 Units Subcutaneous TID WC  . insulin glargine  10 Units Subcutaneous QHS  . levothyroxine  88 mcg Oral Daily  . metoprolol tartrate  50 mg Oral BID  . montelukast  10 mg Oral QHS  . phosphorus  500 mg Oral BID  . predniSONE  20 mg Oral Q breakfast  . Ensure Max Protein  11 oz Oral BID   Continuous Infusions: . sodium chloride Stopped (06/27/19 0132)  .  ceFAZolin (ANCEF) IV 2 g (06/30/19 1230)  . lactated ringers       LOS: 3 days   Time spent: 35 minutes.  Lorella Nimrod, MD [facility]  If 7PM-7AM, please contact  night-coverage [email or web address]  06/30/2019, 2:09 PM   This record has been created using Systems analyst. Errors have been sought and corrected,but may not always be located. Such creation errors do not reflect on the standard of care.

## 2019-06-30 NOTE — Care Management Important Message (Signed)
Important Message  Patient Details  Name: Philip NOLA Sr. MRN: [id number] Date of Birth: 1946/04/28   Medicare Important Message Given:  Yes     Dannette Barbara 06/30/2019, 11:09 AM

## 2019-06-30 NOTE — Progress Notes (Signed)
Pharmacy Electrolyte Monitoring Consult:  Pharmacy consulted to assist in monitoring and replacing electrolytes in this 74 y.o. male admitted on 06/26/2019 with septic shock and acute respiratory failure. Patient with past medical history significant for COPD, diastolic dysfunction, atrial fibrillation, diabetes, shingles, CAD, HTN, asthma, and arthritis.    Labs:  Sodium (mmol/L)  Date Value  06/30/2019 141  04/17/2019 140   Potassium (mmol/L)  Date Value  06/30/2019 4.2   Magnesium (mg/dL)  Date Value  06/29/2019 2.5 (H)   Phosphorus (mg/dL)  Date Value  06/30/2019 2.6   Calcium (mg/dL)  Date Value  06/30/2019 8.4 (L)   Albumin (g/dL)  Date Value  06/26/2019 3.3 (L)  12/06/2015 3.9    Assessment/Plan: Pt transferred to the floor. Electrolytes seem to be stable. Pharmacy will sign off at this time.   No replacement needed today.   Oswald Hillock, PharmD, BCPS Clinical Pharmacist 06/30/2019 7:39 AM

## 2019-07-01 ENCOUNTER — Inpatient Hospital Stay: Payer: Medicare Other

## 2019-07-01 DIAGNOSIS — N179 Acute kidney failure, unspecified: Secondary | ICD-10-CM

## 2019-07-01 DIAGNOSIS — J189 Pneumonia, unspecified organism: Secondary | ICD-10-CM

## 2019-07-01 DIAGNOSIS — A4101 Sepsis due to Methicillin susceptible Staphylococcus aureus: Principal | ICD-10-CM

## 2019-07-01 DIAGNOSIS — R6520 Severe sepsis without septic shock: Secondary | ICD-10-CM

## 2019-07-01 DIAGNOSIS — J441 Chronic obstructive pulmonary disease with (acute) exacerbation: Secondary | ICD-10-CM

## 2019-07-01 DIAGNOSIS — R0602 Shortness of breath: Secondary | ICD-10-CM

## 2019-07-01 LAB — BASIC METABOLIC PANEL
Anion gap: 10 (ref 5–15)
BUN: 23 mg/dL (ref 8–23)
CO2: 26 mmol/L (ref 22–32)
Calcium: 8.2 mg/dL — ABNORMAL LOW (ref 8.9–10.3)
Chloride: 105 mmol/L (ref 98–111)
Creatinine, Ser: 0.93 mg/dL (ref 0.61–1.24)
GFR calc Af Amer: >60 mL/min (ref >60)
GFR calc non Af Amer: >60 mL/min (ref >60)
Glucose, Bld: 130 mg/dL — ABNORMAL HIGH (ref 70–99)
Potassium: 3.5 mmol/L (ref 3.5–5.1)
Sodium: 141 mmol/L (ref 135–145)

## 2019-07-01 LAB — CBC
HCT: 41 % (ref 39.0–52.0)
Hemoglobin: 14.2 g/dL (ref 13.0–17.0)
MCH: 30.5 pg (ref 26.0–34.0)
MCHC: 34.6 g/dL (ref 30.0–36.0)
MCV: 88 fL (ref 80.0–100.0)
Platelets: 223 10*3/uL (ref 150–400)
RBC: 4.66 MIL/uL (ref 4.22–5.81)
RDW: 13.4 % (ref 11.5–15.5)
WBC: 12.6 10*3/uL — ABNORMAL HIGH (ref 4.0–10.5)
nRBC: 0.2 % (ref 0.0–0.2)

## 2019-07-01 LAB — CULTURE, BLOOD (ROUTINE X 2)
Culture: NO GROWTH
Special Requests: ADEQUATE

## 2019-07-01 LAB — GLUCOSE, CAPILLARY
Glucose-Capillary: 131 mg/dL — ABNORMAL HIGH (ref 70–99)
Glucose-Capillary: 98 mg/dL (ref 70–99)
Glucose-Capillary: 143 mg/dL — ABNORMAL HIGH (ref 70–99)
Glucose-Capillary: 144 mg/dL — ABNORMAL HIGH (ref 70–99)

## 2019-07-01 MED ORDER — FUROSEMIDE 20 MG PO TABS
20.0000 mg | ORAL_TABLET | Freq: Every day | ORAL | Status: DC
  Administered 2019-07-01 – 2019-07-03 (×3): 20 mg via ORAL
  Filled 2019-07-01 (×3): qty 1

## 2019-07-01 MED ORDER — HYDROCORTISONE NA SUCCINATE PF 250 MG IJ SOLR
200.0000 mg | Freq: Once | INTRAMUSCULAR | Status: DC
  Filled 2019-07-01: qty 200

## 2019-07-01 MED ORDER — LEVALBUTEROL HCL 0.63 MG/3ML IN NEBU
0.6300 mg | INHALATION_SOLUTION | Freq: Four times a day (QID) | RESPIRATORY_TRACT | Status: DC
  Administered 2019-07-01 – 2019-07-04 (×13): 0.63 mg via RESPIRATORY_TRACT
  Filled 2019-07-01 (×14): qty 3

## 2019-07-01 MED ORDER — DIPHENHYDRAMINE HCL 25 MG PO CAPS: 50.0000 mg | ORAL_CAPSULE | Freq: Once | ORAL | Status: DC

## 2019-07-01 MED ORDER — SODIUM CHLORIDE 0.9% FLUSH
3.0000 mL | Freq: Two times a day (BID) | INTRAVENOUS | Status: DC
  Administered 2019-07-01 – 2019-07-02 (×3): 3 mL via INTRAVENOUS

## 2019-07-01 MED ORDER — POTASSIUM CHLORIDE CRYS ER 20 MEQ PO TBCR
40.0000 meq | EXTENDED_RELEASE_TABLET | Freq: Once | ORAL | Status: AC
  Administered 2019-07-01: 40 meq via ORAL
  Filled 2019-07-01: qty 2

## 2019-07-01 MED ORDER — DIPHENHYDRAMINE HCL 50 MG/ML IJ SOLN: 50.0000 mg | Freq: Once | INTRAMUSCULAR | Status: DC

## 2019-07-01 MED ORDER — FUROSEMIDE 10 MG/ML IJ SOLN
40.0000 mg | Freq: Once | INTRAMUSCULAR | Status: AC
  Administered 2019-07-01: 40 mg via INTRAVENOUS
  Filled 2019-07-01: qty 4

## 2019-07-01 MED ORDER — PREDNISONE 20 MG PO TABS
40.0000 mg | ORAL_TABLET | Freq: Every day | ORAL | Status: AC
  Administered 2019-07-02 – 2019-07-04 (×3): 40 mg via ORAL
  Filled 2019-07-01 (×4): qty 2

## 2019-07-01 NOTE — Progress Notes (Signed)
Progress Note  Patient Name: Philip TISDEL Sr. Date of Encounter: 07/01/2019  Primary Cardiologist: [name], MD  Subjective   Fever this AM, yet he reports no chills or feeling of fever. Continues to note SOB on 3L [place] (does not use oxygen at home) but able to lay flat / no orthopnea and improving volume status since stopping fluids yesterday. States today that he is trying to stay still so that he does not cough. Discussed his case with IM this morning -see A/P below.  Inpatient Medications    Scheduled Meds: . apixaban  5 mg Oral BID  . atorvastatin  40 mg Oral Daily  . budesonide (PULMICORT) nebulizer solution  0.5 mg Nebulization BID  . Chlorhexidine Gluconate Cloth  6 each Topical Daily  . famotidine  20 mg Oral BID  . gabapentin  100 mg Oral TID  . guaiFENesin  600 mg Oral BID  . insulin aspart  0-20 Units Subcutaneous TID WC  . insulin aspart  0-5 Units Subcutaneous QHS  . insulin aspart  6 Units Subcutaneous TID WC  . insulin glargine  10 Units Subcutaneous QHS  . levothyroxine  88 mcg Oral Daily  . metoprolol tartrate  50 mg Oral BID  . montelukast  10 mg Oral QHS  . predniSONE  20 mg Oral Q breakfast  . Ensure Max Protein  11 oz Oral BID   Continuous Infusions: . sodium chloride Stopped (06/27/19 0132)  .  ceFAZolin (ANCEF) IV 2 g (07/01/19 0507)  . lactated ringers     PRN Meds: guaiFENesin-dextromethorphan, ipratropium, levalbuterol, phenol   Vital Signs    Vitals:   06/30/19 2011 06/30/19 2358 07/01/19 0514 07/01/19 0709  BP: 134/74  (!) 141/92   Pulse: (!) 111 95 (!) 125   Resp:      Temp: 98.3 F (36.8 C)  (!) 100.8 F (38.2 C) 99.8 F (37.7 C)  TempSrc: Oral  Oral Oral  SpO2: 97%  91%   Weight:   97.3 kg   Height:        Intake/Output Summary (Last 24 hours) at 07/01/2019 0803 Last data filed at 07/01/2019 0515 Gross per 24 hour  Intake 720 ml  Output 2200 ml  Net -1480 ml   Last 3 Weights 07/01/2019 06/30/2019 06/29/2019  Weight (lbs)  214 lb 8 oz 218 lb 1.6 oz 216 lb 8 oz  Weight (kg) 97.297 kg 98.93 kg 98.204 kg      Telemetry    Atrial fibrillation with rates still  Uncontrolled and into the 150s - Personally Reviewed  ECG    No new tracings - Personally Reviewed  Physical Exam   GEN: No acute distress.   Neck:  JVD difficult to assess due to body habitus Cardiac: IRIR and tachycardic, 1/6 systolic murmur. No rubs, or gallops.  Respiratory: Bilaterally rhonchi and wheezing GI:  Soft, nontender MS: 1+ bilateral edema; No deformity. Neuro:  Nonfocal  Psych: Normal affect   Labs    High Sensitivity Troponin:   Recent Labs  Lab 06/26/19 2224 06/26/19 2336  TROPONINIHS 8 7      Chemistry Recent Labs  Lab 06/26/19 2224 06/27/19 0508 06/29/19 0554 06/30/19 0448 07/01/19 0537  NA 140   < > 137 141 141  K 3.2*   < > 4.1 4.2 3.5  CL 110   < > 106 108 105  CO2 21*   < > 21* 26 26  GLUCOSE 194*   < > 212*  137* 130*  BUN 24*   < > 30* 27* 23  CREATININE 1.44*   < > 1.07 0.92 0.93  CALCIUM 7.9*   < > 8.5* 8.4* 8.2*  PROT 4.9*  --   --   --   --   ALBUMIN 3.3*  --   --   --   --   AST 33  --   --   --   --   ALT 24  --   --   --   --   ALKPHOS 80  --   --   --   --   BILITOT 1.0  --   --   --   --   GFRNONAA 48*   < > >60 >60 >60  GFRAA 56*   < > >60 >60 >60  ANIONGAP 9   < > 10 7 10    < > = values in this interval not displayed.     Hematology Recent Labs  Lab 06/29/19 0554 06/30/19 0448 07/01/19 0537  WBC 17.4* 15.5* 12.6*  RBC 4.24 4.05* 4.66  HGB 12.9* 12.6* 14.2  HCT 37.6* 35.6* 41.0  MCV 88.7 87.9 88.0  MCH 30.4 31.1 30.5  MCHC 34.3 35.4 34.6  RDW 13.3 13.3 13.4  PLT 162 168 223    BNP Recent Labs  Lab 06/26/19 2145 06/30/19 0448  BNP 583.0* 857.0*     DDimer No results for input(s): DDIMER in the last 168 hours.   Radiology    ECHOCARDIOGRAM COMPLETE  Result Date: 06/29/2019    ECHOCARDIOGRAM REPORT   Patient Name:   Philip TSUNG Sr. Date of Exam: 06/29/2019  Medical Rec #:  [id number]          Height:       71.0 in Accession #:    [id number]         Weight:       216.5 lb Date of Birth:  09-08-1945         BSA:          2.181 m Patient Age:    74 years           BP:           122/96 mmHg Patient Gender: M                  HR:           101 bpm. Exam Location:  [facility] Procedure: 2D Echo Indications:     BACTEREMIA 790.7/R78.81  History:         Patient has prior history of Echocardiogram examinations, most                  recent 07/26/2015. CAD, Arrythmias:Atrial Fibrillation; Risk                  Factors:Dyslipidemia, Hypertension and Diabetes.  Sonographer:     Avanell Shackleton Referring Phys:  [id number] AMIN Diagnosing Phys: Ida Rogue MD IMPRESSIONS  1. Left ventricular ejection fraction, by estimation, is 40 to 45%. The left ventricle has mildly decreased function. The left ventricle demonstrates global hypokinesis. The left ventricular internal cavity size was moderately dilated. Left ventricular diastolic parameters are indeterminate.  2. Right ventricular systolic function is mildly reduced. The right ventricular size is mildly enlarged. There is moderately elevated pulmonary artery systolic pressure.  3. Left atrial size was mildly dilated.  4. Right atrial size was mildly dilated.  5. The mitral  valve is normal in structure and function. Mild to moderate mitral valve regurgitation. No evidence of mitral stenosis.  6. Tricuspid valve regurgitation is mild to moderate.  7. No valve vegetation noted. FINDINGS  Left Ventricle: Left ventricular ejection fraction, by estimation, is 40 to 45%. The left ventricle has mildly decreased function. The left ventricle demonstrates global hypokinesis. The left ventricular internal cavity size was moderately dilated. There is no left ventricular hypertrophy. Left ventricular diastolic parameters are indeterminate. Right Ventricle: The right ventricular size is mildly enlarged. No increase in right ventricular wall  thickness. Right ventricular systolic function is mildly reduced. There is moderately elevated pulmonary artery systolic pressure. The tricuspid regurgitant velocity is 3.23 m/s, and with an assumed right atrial pressure of 10 mmHg, the estimated right ventricular systolic pressure is [id number] mmHg. Left Atrium: Left atrial size was mildly dilated. Right Atrium: Right atrial size was mildly dilated. Pericardium: There is no evidence of pericardial effusion. Mitral Valve: The mitral valve is normal in structure and function. Normal mobility of the mitral valve leaflets. Mild to moderate mitral valve regurgitation. No evidence of mitral valve stenosis. Tricuspid Valve: The tricuspid valve is normal in structure. Tricuspid valve regurgitation is mild to moderate. No evidence of tricuspid stenosis. Aortic Valve: The aortic valve is normal in structure and function. Aortic valve regurgitation is not visualized. No aortic stenosis is present. Pulmonic Valve: The pulmonic valve was normal in structure. Pulmonic valve regurgitation is not visualized. No evidence of pulmonic stenosis. Aorta: The aortic root is normal in size and structure. Venous: The inferior vena cava is normal in size with greater than 50% respiratory variability, suggesting right atrial pressure of 3 mmHg. IAS/Shunts: No atrial level shunt detected by color flow Doppler.  LEFT VENTRICLE PLAX 2D LVIDd:         4.80 cm LVIDs:         4.00 cm LV PW:         0.94 cm LV IVS:        0.93 cm LVOT diam:     2.20 cm LVOT Area:     3.80 cm  IVC IVC diam: 2.01 cm LEFT ATRIUM             Index       RIGHT ATRIUM           Index LA diam:        4.70 cm 2.16 cm/m  RA Area:     19.20 cm LA Vol (A2C):   80.0 ml 36.68 ml/m RA Volume:   58.30 ml  26.73 ml/m LA Vol (A4C):   62.3 ml 28.57 ml/m LA Biplane Vol: 73.3 ml 33.61 ml/m   AORTA Ao Root diam: 3.30 cm MR Peak grad: 95.1 mmHg   TRICUSPID VALVE MR Mean grad: 62.0 mmHg   TR Peak grad:   41.7 mmHg MR Vmax:      487.50  cm/s TR Vmax:        323.00 cm/s MR Vmean:     374.5 cm/s                           SHUNTS                           Systemic Diam: 2.20 cm Ida Rogue MD Electronically signed by Ida Rogue MD Signature Date/Time: 06/29/2019/3:21:50 PM    Final     Cardiac Studies  Echo 06/29/19 1. Left ventricular ejection fraction, by estimation, is 40 to 45%. The  left ventricle has mildly decreased function. The left ventricle  demonstrates global hypokinesis. The left ventricular internal cavity size  was moderately dilated. Left ventricular  diastolic parameters are indeterminate.  2. Right ventricular systolic function is mildly reduced. The right  ventricular size is mildly enlarged. There is moderately elevated  pulmonary artery systolic pressure.  3. Left atrial size was mildly dilated.  4. Right atrial size was mildly dilated.  5. The mitral valve is normal in structure and function. Mild to moderate  mitral valve regurgitation. No evidence of mitral stenosis.  6. Tricuspid valve regurgitation is mild to moderate.  7. No valve vegetation noted.   08/24/2015 Holter, 48H Normal sinus rhythm with frequent PACs. A total of 18,000 PACs in 48 hours of presenting 12%. Lowest heart rate was 43 bpm with overall no pauses greater than 2 seconds.  Patient Profile     74 y.o. male  with a hx of  w CAD s/p three-vessel bypass 05/2012, hypertension, hyperlipidemia, DM2, diastolic dysfunction, paroxysmal atrial fibrillation, ectopic atrial tachycardia, and COPD, who presents today for evaluation of transesophageal echocardiogram in the setting of bacteremia and atrial fibrillation.   Assessment & Plan    Bacteremia with request for TEE --Request for TEE per ID for MSSA bacteremia. No valve vegetation noted on most recent TTE. Rate continues to be uncontrolled with poor breathing status and fever this AM.Continue to not recommend TEE today. Will take off of NPO status.  --Reached out to  internal medicine regarding ordering a chest CT, and per ID recommendations yesterday. Will need to reassess pulmonary dz and rule out PE / pulmonary embolism. Recommend monitor fever as well and consider contact precautions.  --Delay TEE until later in the week and breathing status and ventricular rates improved. Verbally consented for TEE today but will continue to reassess on a daily basis.  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  --As above, and per ID recommendations yesterday, recommend CT today given return of fever today and to reassess PNA. Also recommend CT to rule out PE given hypoxic on 3L, tachycardic, bloody sputum, and RV dilation on echo and continued elevated rates. Discussed this recommendation with IM early this AM  Continue to recommend against fluids. Restarted low dose lasix, which had been discontinued earlier this admission. Recommend strict I/Os, daily standing weights. Daily BMET. As below, once recovered, will need outpatient ischemic workup for newly reduced EF with stress testing +/- R/LHC.  Persistent atrial fibrillation with RVR --Remains in Afib with rates poorly controlled in the setting of llness / sepsis / current fever, hypokalemia, COPD, and volume overload s/p IVF. Amiodarone d/c'd in 2018 due to side effects. --Increased his BB dose to metoprolol 50mg  BID yesterday without much improvement in rate today. Suspect this is 2/2 worsening illness / respiration status and given today's fever. As above, need him rate controlled to allow for optimal imaging on TEE. Recommend against further fluids. Continue to monitor electrolytes, BMET. Continue OAC and to monitor CBC. Continue [place]. CHA2DS2VASc score of at least 4  (CHF, HTN, agex1, DM2).  CAD --No CP. Reports cough, SOB at rest, and chest tightness preceding most recent admission.  This has now  returned today. High-sensitivity troponin has been minimally elevated and flat trending and EKG without acute ST or T changes, which is not  consistent with ACS. Reduced EF on recent echo, as well as LV hypokinesis. Once recovered, recommend outpatient ischemic workup  with stress testing +/- catheterization this admission or as an outpatient. For now, continue statin, increased dose BB, and ARB. No ASA in the setting of Eliquis.   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 Caution with fluids.   HTN --Increased BB. Continue ARB. Recommend against further fluids given newly reduced EF and volume overload.  HLD --Continue statin.  DM2 --SSI, Per IM.   COPD --Chronic DOE. Inhalers and breathing treatments per IM. Of note, inhalers and breathing treatments may further increase rates.  For questions or updates, please contact [place] Please consult [email or web address] for contact info under        Signed, Arvil Chaco, [name]  07/01/2019, 8:03 AM

## 2019-07-01 NOTE — Progress Notes (Signed)
[facility] INFECTIOUS DISEASE PROGRESS NOTE Date of Admission:  06/26/2019     ID: Philip Potters Sr. is a 74 y.o. male with MSSA bacteremia, PNA Active Problems:   Atrial fibrillation with rapid ventricular response (HCC)   Chronic obstructive pulmonary disease ([place])   Shock ([place])   Community acquired pneumonia of left lower lobe of lung   Sepsis ([place])   Subjective: Fever 100.8. On o2.   ROS  Eleven systems are reviewed and negative except per hpi  Medications:  Antibiotics Given (last 72 hours)    Date/Time Action Medication Dose Rate   06/29/19 1233 New Bag/Given   ceFAZolin (ANCEF) IVPB 2g/100 mL premix 2 g 200 mL/hr   06/29/19 1935 New Bag/Given   ceFAZolin (ANCEF) IVPB 2g/100 mL premix 2 g 200 mL/hr   06/30/19 0436 New Bag/Given   ceFAZolin (ANCEF) IVPB 2g/100 mL premix 2 g 200 mL/hr   06/30/19 1230 New Bag/Given   ceFAZolin (ANCEF) IVPB 2g/100 mL premix 2 g 200 mL/hr   06/30/19 2047 New Bag/Given   ceFAZolin (ANCEF) IVPB 2g/100 mL premix 2 g 200 mL/hr   07/01/19 0507 New Bag/Given   ceFAZolin (ANCEF) IVPB 2g/100 mL premix 2 g 200 mL/hr   07/01/19 1227 New Bag/Given   ceFAZolin (ANCEF) IVPB 2g/100 mL premix 2 g 200 mL/hr     . apixaban  5 mg Oral BID  . atorvastatin  40 mg Oral Daily  . budesonide (PULMICORT) nebulizer solution  0.5 mg Nebulization BID  . Chlorhexidine Gluconate Cloth  6 each Topical Daily  . diphenhydrAMINE  50 mg Oral Once   Or  . diphenhydrAMINE  50 mg Intravenous Once  . famotidine  20 mg Oral BID  . furosemide  20 mg Oral Daily  . gabapentin  100 mg Oral TID  . guaiFENesin  600 mg Oral BID  . insulin aspart  0-20 Units Subcutaneous TID WC  . insulin aspart  0-5 Units Subcutaneous QHS  . insulin aspart  6 Units Subcutaneous TID WC  . insulin glargine  10 Units Subcutaneous QHS  . levalbuterol  0.63 mg Nebulization Q6H  . levothyroxine  88 mcg Oral Daily  . metoprolol tartrate  50 mg Oral BID  . montelukast  10 mg Oral QHS  .  [START ON 07/02/2019] predniSONE  40 mg Oral Q breakfast  . Ensure Max Protein  11 oz Oral BID    Objective: Vital signs in last 24 hours: Temp:  [98.1 F (36.7 C)-100.8 F (38.2 C)] 99.3 F (37.4 C) (03/02 1130) Pulse Rate:  [73-125] 93 (03/02 1130) Resp:  [17-20] 20 (03/02 1130) BP: (104-142)/(74-92) 104/88 (03/02 1130) SpO2:  [90 %-97 %] 96 % (03/02 1130) Weight:  [97.3 kg] 97.3 kg (03/02 0514) Physical Exam  Constitutional: sleping at time of visit. Appears comfortable. Wife at bedside. On O2.   Lab Results Recent Labs    06/30/19 0448 07/01/19 0537  WBC 15.5* 12.6*  HGB 12.6* 14.2  HCT 35.6* 41.0  NA 141 141  K 4.2 3.5  CL 108 105  CO2 26 26  BUN 27* 23  CREATININE 0.92 0.93    Microbiology: Results for orders placed or performed during the hospital encounter of 06/26/19  Blood Culture (routine x 2)     Status: None   Collection Time: 06/26/19  9:45 PM   Specimen: BLOOD  Result Value Ref Range Status   Specimen Description BLOOD LEFT HAND  Final   Special Requests   Final  BOTTLES DRAWN AEROBIC AND ANAEROBIC Blood Culture adequate volume   Culture   Final    NO GROWTH 5 DAYS Performed at [facility], [address] [place], [place] [address]    Report Status 07/01/2019 FINAL  Final  Blood Culture (routine x 2)     Status: Abnormal   Collection Time: 06/26/19  9:58 PM   Specimen: BLOOD  Result Value Ref Range Status   Specimen Description   Final    BLOOD BLOOD RIGHT HAND Performed at [facility], [address], [place], [place] [address]    Special Requests   Final    BOTTLES DRAWN AEROBIC AND ANAEROBIC Blood Culture adequate volume Performed at [facility], [address] [place], [place] [address]    Culture  Setup Time   Final    GRAM POSITIVE COCCI AEROBIC BOTTLE ONLY CRITICAL RESULT CALLED TO, READ BACK BY AND VERIFIED WITH: KEYSHAWN PATEL 06/27/19 @ 1400  [place] Performed at [facility], [address] [place], [place] [address]    Culture STAPHYLOCOCCUS AUREUS (A)  Final   Report Status 06/29/2019 FINAL  Final   Organism ID, Bacteria STAPHYLOCOCCUS AUREUS  Final      Susceptibility   Staphylococcus aureus - MIC*    CIPROFLOXACIN >=8 RESISTANT Resistant     ERYTHROMYCIN 0.5 SENSITIVE Sensitive     GENTAMICIN <=0.5 SENSITIVE Sensitive     OXACILLIN 0.5 SENSITIVE Sensitive     TETRACYCLINE <=1 SENSITIVE Sensitive     VANCOMYCIN <=0.5 SENSITIVE Sensitive     TRIMETH/SULFA <=10 SENSITIVE Sensitive     CLINDAMYCIN <=0.25 SENSITIVE Sensitive     RIFAMPIN <=0.5 SENSITIVE Sensitive     Inducible Clindamycin NEGATIVE Sensitive     * STAPHYLOCOCCUS AUREUS  Respiratory Panel by RT PCR (Flu A&B, Covid) - Nasopharyngeal Swab     Status: None   Collection Time: 06/26/19 10:49 PM   Specimen: Nasopharyngeal Swab  Result Value Ref Range Status   SARS Coronavirus 2 by RT PCR NEGATIVE NEGATIVE Final    Comment: (NOTE) SARS-CoV-2 target nucleic acids are NOT DETECTED. The SARS-CoV-2 RNA is generally detectable in upper respiratoy specimens during the acute phase of infection. The lowest concentration of SARS-CoV-2 viral copies this assay can detect is 131 copies/mL. A negative result does not preclude SARS-Cov-2 infection and should not be used as the sole basis for treatment or other patient management decisions. A negative result may occur with  improper specimen collection/handling, submission of specimen other than nasopharyngeal swab, presence of viral mutation(s) within the areas targeted by this assay, and inadequate number of viral copies (<131 copies/mL). A negative result must be combined with clinical observations, patient history, and epidemiological information. The expected result is Negative. Fact Sheet for Patients:  PinkCheek.be Fact Sheet for Healthcare Providers:  GravelBags.it This test is not yet ap proved or  cleared by the [place] FDA and  has been authorized for detection and/or diagnosis of SARS-CoV-2 by FDA under an Emergency Use Authorization (EUA). This EUA will remain  in effect (meaning this test can be used) for the duration of the COVID-19 declaration under Section 564(b)(1) of the Act, 21 U.S.C. section 360bbb-3(b)(1), unless the authorization is terminated or revoked sooner.    Influenza A by PCR NEGATIVE NEGATIVE Final   Influenza B by PCR NEGATIVE NEGATIVE Final    Comment: (NOTE) The Xpert Xpress SARS-CoV-2/FLU/RSV assay is intended as an aid in  the diagnosis of influenza from Nasopharyngeal swab  specimens and  should not be used as a sole basis for treatment. Nasal washings and  aspirates are unacceptable for Xpert Xpress SARS-CoV-2/FLU/RSV  testing. Fact Sheet for Patients: PinkCheek.be Fact Sheet for Healthcare Providers: GravelBags.it This test is not yet approved or cleared by the [place] FDA and  has been authorized for detection and/or diagnosis of SARS-CoV-2 by  FDA under an Emergency Use Authorization (EUA). This EUA will remain  in effect (meaning this test can be used) for the duration of the  Covid-19 declaration under Section 564(b)(1) of the Act, 21  U.S.C. section 360bbb-3(b)(1), unless the authorization is  terminated or revoked. Performed at [facility], [address], [place], [place] [address]   Urine culture     Status: None   Collection Time: 06/27/19  1:30 AM   Specimen: In/Out Cath Urine  Result Value Ref Range Status   Specimen Description   Final    IN/OUT CATH URINE Performed at [facility], [address] [place], [place] [address]    Special Requests   Final    NONE Performed at [facility], [address], [place], [place] [address]    Culture   Final    NO GROWTH Performed at [facility], [place] [address], [place],  [place] [address]    Report Status 06/28/2019 FINAL  Final  Culture, sputum-assessment     Status: None   Collection Time: 06/27/19  1:30 AM   Specimen: Expectorated Sputum  Result Value Ref Range Status   Specimen Description EXPECTORATED SPUTUM  Final   Special Requests NONE  Final   Sputum evaluation   Final    THIS SPECIMEN IS ACCEPTABLE FOR SPUTUM CULTURE Performed at [facility], [address] [place], [place] [address]    Report Status 06/27/2019 FINAL  Final  MRSA PCR Screening     Status: None   Collection Time: 06/27/19  1:30 AM   Specimen: Nasal Mucosa; Nasopharyngeal  Result Value Ref Range Status   MRSA by PCR NEGATIVE NEGATIVE Final    Comment:        The GeneXpert MRSA Assay (FDA approved for NASAL specimens only), is one component of a comprehensive MRSA colonization surveillance program. It is not intended to diagnose MRSA infection nor to guide or monitor treatment for MRSA infections. Performed at [facility], [address] [place], [place] [address]   Culture, respiratory     Status: None (Preliminary result)   Collection Time: 06/27/19  1:30 AM  Result Value Ref Range Status   Specimen Description   Final    EXPECTORATED SPUTUM Performed at [facility], [address] [place], [place] [address]    Special Requests   Final    NONE Reflexed from [id number] Performed at [facility], [place], [place] [address]    Gram Stain   Final    ABUNDANT WBC PRESENT,BOTH PMN AND MONONUCLEAR MODERATE GRAM POSITIVE COCCI FEW GRAM POSITIVE RODS RARE GRAM NEGATIVE RODS RARE YEAST MODERATE SQUAMOUS EPITHELIAL CELLS PRESENT    Culture   Final    CULTURE REINCUBATED FOR BETTER GROWTH Performed at [facility], [place] [address] [place], [place] [address]    Report Status PENDING  Incomplete  Group A Strep by PCR     Status: None   Collection Time: 06/27/19  2:11 AM   Specimen: Throat; Sterile Swab  Result  Value Ref Range Status   Group  A Strep by PCR NOT DETECTED NOT DETECTED Final    Comment: Performed at [facility], [address] [place], [place] [address]  Culture, blood (single) w Reflex to ID Panel     Status: None (Preliminary result)   Collection Time: 06/29/19  1:11 PM   Specimen: BLOOD  Result Value Ref Range Status   Specimen Description BLOOD RAC  Final   Special Requests   Final    BOTTLES DRAWN AEROBIC AND ANAEROBIC Blood Culture results may not be optimal due to an excessive volume of blood received in culture bottles   Culture   Final    NO GROWTH 2 DAYS Performed at [facility], [address], [place], [place] [address]    Report Status PENDING  Incomplete   Studies/Results: DG Chest 2 View  Result Date: 07/01/2019 CLINICAL DATA:  Shortness of breath and productive cough. EXAM: CHEST - 2 VIEW COMPARISON:  06/28/2019 FINDINGS: The cardiac silhouette, mediastinal and hilar contours are within normal limits and stable. 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 Persistent left lower lobe airspace process most consistent with pneumonia. No interval change. IMPRESSION: Persistent left lower lobe pneumonia. Electronically Signed   By: Marijo Sanes M.D.   On: 07/01/2019 09:35    Assessment/Plan: HENERY BRETL Sr. is a 74 y.o. male with MMP including COPD, CHF, A fib, DM CAD admitted with 2 week resp illness and progressive SOB with purulent then bloody sputum. CXR with LLL opacity. BCX + MSSA.  TTE neg for veg. No obvious source of MSSA bacteremia unless he had CAP from MSSA which is unusual but does occur. No obvious metastatic sites of infection. Has no prosthetic joints or intravascular devices but does have hardware in C spine following surgery in past. No current back or neck pain. No joint swelling or pain.   3/2 - persistent fevers, fu bcx remain neg. Sputum cx P Recommendations Cont cefazolin. Check TEE. Sputum cx pending.  Once bcx neg x  48 hours will need PICC line or midline for min 2 weeks IV abx. If TEE negative, blood cultures clear rapidly, has good clinical response and no sites of metastatic infection, could consider short 2 week course.  Thank you very much for the consult. Will follow with you.  Leonel Ramsay   07/01/2019, 2:19 PM

## 2019-07-01 NOTE — Progress Notes (Signed)
PROGRESS NOTE    Philip Potters Sr.  [id number] DOB: 03-23-1946 DOA: 06/26/2019 PCP: [name], DO   Brief Narrative:  This is a 74 yo male with a PMH of Type II Diabetes Mellitus, OSA (does not wear CPAP), Shingles, Paroxymal Atrial Fibrillation on Eliquis, Neuropathy, Hypercholesteremia, HOH, Bronchitis, Chronic Diastolic CHF (EF [id number]), CABG, CAD, COPD, Atrial Tachycardia, Asthma, and Arthritis.  He presented to [facility] ER on 02/25 via EMS with shortness of breath onset 02/25.  He also endorsed chills, shortness of breath, and a fever. He also states he returned to work this week (he works for [employer]). Upon EMS arrival he received solumedrol and duoneb's x2.  It was also noted cardiac rhythm atrial fibrillation with rvr hr 140's bpm. Influenza PCR/COVID-19 negative, however CXR concerning mild vascular congestion and LLL consolidation vs. Atelectasis. Sepsis protocol initiated pt received 1L LR bolus, 2L NS bolus, cefepime, solumedrol, azithromycin, and duoneb treatments.  Pt initially admitted to the medsurg unit per hospitalist team for additional workup and treatment.  However, he later developed hypotension requiring levophed gtt and transfer to ICU. Transferred out of ICU on 06/28/2019.  Blood pressure stable without levo.  Aerobic blood culture bottles are growing gram-positive cocci and vancomycin was added to ceftriaxone and azithromycin.  Subjective: Patient was complaining of some dyspnea and palpitations.  Accompanied by his wife.  Assessment & Plan:   Active Problems:   Atrial fibrillation with rapid ventricular response (HCC)   Chronic obstructive pulmonary disease ([place])   Shock ([place])   Community acquired pneumonia of left lower lobe of lung   Sepsis ([place])  Acute on chronic respiratory failure secondary to CAP and AECOPD  Hx: Bronchitis and Asthma.  He was given higher doses of IV Solu-Medrol with breathing treatment.  Becoming mildly hypoxic  again today. .  Chest x-ray with persistent left lower lobe pneumonia, no significant vascular congestion.  There was some concern of PE by ID but patient has allergy to contrast and taking Eliquis.  He was wheezing again today. -Continue Pulmicort. -Increased dose of prednisone to 40 mg daily. -Xopenex every 6 hourly.  Ipratropium allergies listed in his chart.  Hypotension secondary to septic shock due to MSSA bacteremia. Resolved.  Patient was normotensive now. -Blood cultures growing MSSA -Narrow antibiotics to cefazolin. -TTE is without any vegetation but depressed EF as compared to prior. -Cardiology was consulted for newly decreased EF and TEE. -TEE got canceled today due to worsening respiratory status and tachycardia.  New diagnosis of systolic heart failure.  EF at 40 to 45% which is new as compared to prior echo done in 2017.  Patient also having exertional dyspnea.  Denies any orthopnea or PND.  Did receive fluid due to sepsis earlier. Patient has an history of diastolic dysfunction and was taking ACE inhibitor and Lasix at home.  Currently holding due to hypotension on presentation. -Cardiology was consulted-might get ischemic work-up during current hospitalization. -BNP mildly elevated, TSH within normal limit-started him on small dose of diuretic by cardiology. -Restart home dose of ACE inhibitor if blood pressure allows.  Atrial fibrillation with RVR.  Patient remained in A. fib with elevated heart rate. -Increase beta-blocker to 50 mg twice daily per cardiology and monitor. -Continue home dose of Eliquis.   Hypercholesterolemia. -Restart home dose of Lipitor.  Essential hypertension.  Pressure mildly elevated. -Restart home dose of beta-blocker. -Restart home dose of ACE inhibitor if blood pressure allows.  Acute on chronic renal failure.  Resolved.  Secondary to sepsis, hypotension and lactic acidosis.  -Continue to monitor renal function. -Avoid  nephrotoxins.  Type II diabetes mellitus.  Uncontrolled with A1c of 7.9.   -Continue Lantus 10 units at bedtime along with mealtime coverage. -Continue to monitor-anticipated improvement as steroid dose has been decreased. -Continue SSI  Objective: Vitals:   07/01/19 0514 07/01/19 0709 07/01/19 0806 07/01/19 1130  BP: (!) 141/92  (!) 142/86 104/88  Pulse: (!) 125  73 93  Resp:   17 20  Temp: (!) 100.8 F (38.2 C) 99.8 F (37.7 C) 98.4 F (36.9 C) 99.3 F (37.4 C)  TempSrc: Oral Oral Oral Oral  SpO2: 91%  90% 96%  Weight: 97.3 kg     Height:        Intake/Output Summary (Last 24 hours) at 07/01/2019 1327 Last data filed at 07/01/2019 1133 Gross per 24 hour  Intake 360 ml  Output 2125 ml  Net -1765 ml   Filed Weights   06/29/19 0500 06/30/19 0519 07/01/19 0514  Weight: 98.2 kg 98.9 kg 97.3 kg    Examination:  General exam: Appears calm and comfortable  Respiratory system: Scattered crackles with some scattered wheeze bilaterally. Respiratory effort normal. Cardiovascular system: Irregularly irregular, no JVD, murmurs, rubs, gallops or clicks. Gastrointestinal system: Soft, nontender, nondistended, bowel sounds positive. Central nervous system: Alert and oriented. No focal neurological deficits.Symmetric 5 x 5 power. Extremities: No LE edema, no cyanosis, pulses intact and symmetrical. Skin: No rashes, lesions or ulcers Psychiatry: Judgement and insight appear normal. Mood & affect appropriate.    DVT prophylaxis: Eliquis Code Status: Full Family Communication: Wife was updated at bedside. Disposition Plan: Pending improvement.  Most likely will go back home.  Consultants:   PCCM  ID  Cardiology  Procedures:   Antimicrobials:  Cefazolin  Data Reviewed: I have personally reviewed following labs and imaging studies  CBC: Recent Labs  Lab 06/26/19 2145 06/26/19 2145 06/27/19 0508 06/28/19 0425 06/29/19 0554 06/30/19 0448 07/01/19 0537  WBC 4.0   < >  17.4* 12.8* 17.4* 15.5* 12.6*  NEUTROABS 2.9  --  14.9*  --   --   --   --   HGB 13.9   < > 13.5 12.6* 12.9* 12.6* 14.2  HCT 40.4   < > 39.6 36.6* 37.6* 35.6* 41.0  MCV 89.0   < > 91.0 88.8 88.7 87.9 88.0  PLT 162   < > 216 141* 162 168 223   < > = values in this interval not displayed.   Basic Metabolic Panel: Recent Labs  Lab 06/26/19 2224 06/27/19 0150 06/27/19 0508 06/27/19 0739 06/28/19 0425 06/29/19 0554 06/30/19 0448 07/01/19 0537  NA   < >  --  141  --  139 137 141 141  K   < >  --  4.5  --  4.4 4.1 4.2 3.5  CL   < >  --  111  --  109 106 108 105  CO2   < >  --  19*  --  24 21* 26 26  GLUCOSE   < >  --  255*  --  244* 212* 137* 130*  BUN   < >  --  26*  --  32* 30* 27* 23  CREATININE   < >  --  1.52*  --  1.25* 1.07 0.92 0.93  CALCIUM   < >  --  7.8*  --  8.2* 8.5* 8.4* 8.2*  MG  --  1.0*  --  2.5* 2.4 2.5* 2.3  --   PHOS  --   --  2.0*  --  2.1* 2.4* 2.6  --    < > = values in this interval not displayed.   GFR: Estimated Creatinine Clearance: 85.4 mL/min (by C-G formula based on SCr of 0.93 mg/dL). Liver Function Tests: Recent Labs  Lab 06/26/19 2224  AST 33  ALT 24  ALKPHOS 80  BILITOT 1.0  PROT 4.9*  ALBUMIN 3.3*   No results for input(s): LIPASE, AMYLASE in the last 168 hours. No results for input(s): AMMONIA in the last 168 hours. Coagulation Profile: Recent Labs  Lab 06/26/19 2145  INR 1.6*   Cardiac Enzymes: No results for input(s): CKTOTAL, CKMB, CKMBINDEX, TROPONINI in the last 168 hours. BNP (last 3 results) No results for input(s): PROBNP in the last 8760 hours. HbA1C: No results for input(s): HGBA1C in the last 72 hours. CBG: Recent Labs  Lab 06/30/19 1149 06/30/19 1726 06/30/19 2046 07/01/19 0807 07/01/19 1131  GLUCAP 145* 172* 247* 131* 98   Lipid Profile: No results for input(s): CHOL, HDL, LDLCALC, TRIG, CHOLHDL, LDLDIRECT in the last 72 hours. Thyroid Function Tests: Recent Labs    06/30/19 0448  TSH 2.676   Anemia  Panel: No results for input(s): VITAMINB12, FOLATE, FERRITIN, TIBC, IRON, RETICCTPCT in the last 72 hours. Sepsis Labs: Recent Labs  Lab 06/26/19 2224 06/26/19 2336 06/27/19 0150 06/27/19 0508 06/28/19 0425  PROCALCITON 7.19  --  22.84 26.84 18.23  LATICACIDVEN 2.6* 2.9* 3.8*  --   --     Recent Results (from the past 240 hour(s))  Blood Culture (routine x 2)     Status: None   Collection Time: 06/26/19  9:45 PM   Specimen: BLOOD  Result Value Ref Range Status   Specimen Description BLOOD LEFT HAND  Final   Special Requests   Final    BOTTLES DRAWN AEROBIC AND ANAEROBIC Blood Culture adequate volume   Culture   Final    NO GROWTH 5 DAYS Performed at [facility], [address], [place], [place] [address]    Report Status 07/01/2019 FINAL  Final  Blood Culture (routine x 2)     Status: Abnormal   Collection Time: 06/26/19  9:58 PM   Specimen: BLOOD  Result Value Ref Range Status   Specimen Description   Final    BLOOD BLOOD RIGHT HAND Performed at [facility], [address], [place], [place] [address]    Special Requests   Final    BOTTLES DRAWN AEROBIC AND ANAEROBIC Blood Culture adequate volume Performed at [facility], [address] [place], [place] [address]    Culture  Setup Time   Final    GRAM POSITIVE COCCI AEROBIC BOTTLE ONLY CRITICAL RESULT CALLED TO, READ BACK BY AND VERIFIED WITH: KEYSHAWN PATEL 06/27/19 @ 1400  [place] Performed at [facility], [address] [place], [place] [address]    Culture STAPHYLOCOCCUS AUREUS (A)  Final   Report Status 06/29/2019 FINAL  Final   Organism ID, Bacteria STAPHYLOCOCCUS AUREUS  Final      Susceptibility   Staphylococcus aureus - MIC*    CIPROFLOXACIN >=8 RESISTANT Resistant     ERYTHROMYCIN 0.5 SENSITIVE Sensitive     GENTAMICIN <=0.5 SENSITIVE Sensitive     OXACILLIN 0.5 SENSITIVE Sensitive     TETRACYCLINE <=1 SENSITIVE Sensitive     VANCOMYCIN <=0.5 SENSITIVE  Sensitive     TRIMETH/SULFA <=10 SENSITIVE Sensitive  CLINDAMYCIN <=0.25 SENSITIVE Sensitive     RIFAMPIN <=0.5 SENSITIVE Sensitive     Inducible Clindamycin NEGATIVE Sensitive     * STAPHYLOCOCCUS AUREUS  Respiratory Panel by RT PCR (Flu A&B, Covid) - Nasopharyngeal Swab     Status: None   Collection Time: 06/26/19 10:49 PM   Specimen: Nasopharyngeal Swab  Result Value Ref Range Status   SARS Coronavirus 2 by RT PCR NEGATIVE NEGATIVE Final    Comment: (NOTE) SARS-CoV-2 target nucleic acids are NOT DETECTED. The SARS-CoV-2 RNA is generally detectable in upper respiratoy specimens during the acute phase of infection. The lowest concentration of SARS-CoV-2 viral copies this assay can detect is 131 copies/mL. A negative result does not preclude SARS-Cov-2 infection and should not be used as the sole basis for treatment or other patient management decisions. A negative result may occur with  improper specimen collection/handling, submission of specimen other than nasopharyngeal swab, presence of viral mutation(s) within the areas targeted by this assay, and inadequate number of viral copies (<131 copies/mL). A negative result must be combined with clinical observations, patient history, and epidemiological information. The expected result is Negative. Fact Sheet for Patients:  PinkCheek.be Fact Sheet for Healthcare Providers:  GravelBags.it This test is not yet ap proved or cleared by the [place] FDA and  has been authorized for detection and/or diagnosis of SARS-CoV-2 by FDA under an Emergency Use Authorization (EUA). This EUA will remain  in effect (meaning this test can be used) for the duration of the COVID-19 declaration under Section 564(b)(1) of the Act, 21 U.S.C. section 360bbb-3(b)(1), unless the authorization is terminated or revoked sooner.    Influenza A by PCR NEGATIVE NEGATIVE Final   Influenza B by PCR  NEGATIVE NEGATIVE Final    Comment: (NOTE) The Xpert Xpress SARS-CoV-2/FLU/RSV assay is intended as an aid in  the diagnosis of influenza from Nasopharyngeal swab specimens and  should not be used as a sole basis for treatment. Nasal washings and  aspirates are unacceptable for Xpert Xpress SARS-CoV-2/FLU/RSV  testing. Fact Sheet for Patients: PinkCheek.be Fact Sheet for Healthcare Providers: GravelBags.it This test is not yet approved or cleared by the [place] FDA and  has been authorized for detection and/or diagnosis of SARS-CoV-2 by  FDA under an Emergency Use Authorization (EUA). This EUA will remain  in effect (meaning this test can be used) for the duration of the  Covid-19 declaration under Section 564(b)(1) of the Act, 21  U.S.C. section 360bbb-3(b)(1), unless the authorization is  terminated or revoked. Performed at [facility], [address] [place], [place] [address]   Urine culture     Status: None   Collection Time: 06/27/19  1:30 AM   Specimen: In/Out Cath Urine  Result Value Ref Range Status   Specimen Description   Final    IN/OUT CATH URINE Performed at [facility], [address], [place], [place] [address]    Special Requests   Final    NONE Performed at [facility], [address] [place], [place] [address]    Culture   Final    NO GROWTH Performed at [facility], [place] [address], [place], [place] [address]    Report Status 06/28/2019 FINAL  Final  Culture, sputum-assessment     Status: None   Collection Time: 06/27/19  1:30 AM   Specimen: Expectorated Sputum  Result Value Ref Range Status   Specimen Description EXPECTORATED SPUTUM  Final   Special Requests NONE  Final   Sputum evaluation   Final    THIS SPECIMEN IS ACCEPTABLE FOR SPUTUM CULTURE Performed at [facility], [address] [place], [place] [address]    Report Status  06/27/2019 FINAL  Final  MRSA PCR Screening     Status: None   Collection Time: 06/27/19  1:30 AM   Specimen: Nasal Mucosa; Nasopharyngeal  Result Value Ref Range Status   MRSA by PCR NEGATIVE NEGATIVE Final    Comment:        The GeneXpert MRSA Assay (FDA approved for NASAL specimens only), is one component of a comprehensive MRSA colonization surveillance program. It is not intended to diagnose MRSA infection nor to guide or monitor treatment for MRSA infections. Performed at [facility], [address] [place], [place] [address]   Culture, respiratory     Status: None (Preliminary result)   Collection Time: 06/27/19  1:30 AM  Result Value Ref Range Status   Specimen Description   Final    EXPECTORATED SPUTUM Performed at [facility], [address] [place], [place] [address]    Special Requests   Final    NONE Reflexed from [id number] Performed at [facility], [place], [place] [address]    Gram Stain   Final    ABUNDANT WBC PRESENT,BOTH PMN AND MONONUCLEAR MODERATE GRAM POSITIVE COCCI FEW GRAM POSITIVE RODS RARE GRAM NEGATIVE RODS RARE YEAST MODERATE SQUAMOUS EPITHELIAL CELLS PRESENT    Culture   Final    CULTURE REINCUBATED FOR BETTER GROWTH Performed at [facility], [place] [address] [place], [place] [address]    Report Status PENDING  Incomplete  Group A Strep by PCR     Status: None   Collection Time: 06/27/19  2:11 AM   Specimen: Throat; Sterile Swab  Result Value Ref Range Status   Group A Strep by PCR NOT DETECTED NOT DETECTED Final    Comment: Performed at [facility], [address] [place], [place] [address]  Culture, blood (single) w Reflex to ID Panel     Status: None (Preliminary result)   Collection Time: 06/29/19  1:11 PM   Specimen: BLOOD  Result Value Ref Range Status   Specimen Description BLOOD RAC  Final   Special Requests   Final    BOTTLES DRAWN AEROBIC AND ANAEROBIC Blood  Culture results may not be optimal due to an excessive volume of blood received in culture bottles   Culture   Final    NO GROWTH 2 DAYS Performed at [facility], [address] [place], [place] [address]    Report Status PENDING  Incomplete     Radiology Studies: DG Chest 2 View  Result Date: 07/01/2019 CLINICAL DATA:  Shortness of breath and productive cough. EXAM: CHEST - 2 VIEW COMPARISON:  06/28/2019 FINDINGS: The cardiac silhouette, mediastinal and hilar contours are within normal limits and stable. 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 Persistent left lower lobe airspace process most consistent with pneumonia. No interval change. IMPRESSION: Persistent left lower lobe pneumonia. Electronically Signed   By: Marijo Sanes M.D.   On: 07/01/2019 09:35    Scheduled Meds: . apixaban  5 mg Oral BID  . atorvastatin  40 mg Oral Daily  . budesonide (PULMICORT) nebulizer solution  0.5 mg Nebulization BID  . Chlorhexidine Gluconate Cloth  6 each Topical Daily  . diphenhydrAMINE  50 mg Oral Once   Or  . diphenhydrAMINE  50 mg Intravenous  Once  . famotidine  20 mg Oral BID  . furosemide  20 mg Oral Daily  . gabapentin  100 mg Oral TID  . guaiFENesin  600 mg Oral BID  . hydrocortisone sod succinate (SOLU-CORTEF) inj  200 mg Intravenous Once  . insulin aspart  0-20 Units Subcutaneous TID WC  . insulin aspart  0-5 Units Subcutaneous QHS  . insulin aspart  6 Units Subcutaneous TID WC  . insulin glargine  10 Units Subcutaneous QHS  . levothyroxine  88 mcg Oral Daily  . metoprolol tartrate  50 mg Oral BID  . montelukast  10 mg Oral QHS  . Ensure Max Protein  11 oz Oral BID   Continuous Infusions: . sodium chloride Stopped (06/27/19 0132)  .  ceFAZolin (ANCEF) IV 2 g (07/01/19 1227)  . lactated ringers       LOS: 4 days   Time spent: 40 minutes.  Lorella Nimrod, MD [facility]  If 7PM-7AM, please contact night-coverage [email or web address]  07/01/2019, 1:27 PM    This record has been created using Systems analyst. Errors have been sought and corrected,but may not always be located. Such creation errors do not reflect on the standard of care.

## 2019-07-01 NOTE — Progress Notes (Signed)
PT Cancellation Note  Patient Details Name: Philip PENLAND Sr. MRN: [id number] DOB: [birth year]   Cancelled Treatment:     PT attempt/ hold. Pt politely refused PT at this time 2/2 to fatigue. Spouse in room with pt and lunch tray at bedside. Pt reports no appetite but trying to eat. Resting HR seated EOB upon therapist arriving to room 128 bpm. PT will continue to follow pt per POC and progress as able while closely monitoring HR/O2/ RR. Per Cardiology note, more testing required moving forward.     [place] Pa 07/01/2019, 1:11 PM

## 2019-07-01 NOTE — Consult Note (Signed)
Pharmacy Antibiotic Note  Philip [name]. is a 74 y.o. male admitted on 06/26/2019 with bacteremia.  Pharmacy has been consulted for Cefazolin dosing.  Blood cultures have returned MSSA bacteremia - will d/c Vancomycin and start Cefazolin.  Pt with documented cephalosporin use in recent history  Plan: Day 6 of abx. Continue Cefazolin 2g q8h, plan for duration of at least 2 week, depending on result of TEE.   Height: 5\' 11"  (180.3 cm) Weight: 214 lb 8 oz (97.3 kg) IBW/kg (Calculated) : 75.3  Temp (24hrs), Avg:99.2 F (37.3 C), Min:98.1 F (36.7 C), Max:100.8 F (38.2 C)  Recent Labs  Lab 06/26/19 2145 06/26/19 2224 06/26/19 2336 06/27/19 0150 06/27/19 0508 06/28/19 0425 06/29/19 0554 06/30/19 0448 07/01/19 0537  WBC   < >  --   --   --  17.4* 12.8* 17.4* 15.5* 12.6*  CREATININE   < > 1.44*  --   --  1.52* 1.25* 1.07 0.92 0.93  LATICACIDVEN  --  2.6* 2.9* 3.8*  --   --   --   --   --    < > = values in this interval not displayed.    Estimated Creatinine Clearance: 85.4 mL/min (by C-G formula based on SCr of 0.93 mg/dL).    Allergies  Allergen Reactions  . Bydureon [Exenatide] Anaphylaxis    Possible from bydureon, not 100% confirmed  . Clindamycin/Lincomycin Hives and Itching  . Penicillins Hives, Itching, Rash and Other (See Comments)    Has patient had a PCN reaction causing immediate rash, facial/tongue/throat swelling, SOB or lightheadedness with hypotension: Yes Has patient had a PCN reaction causing severe rash involving mucus membranes or skin necrosis: No Has patient had a PCN reaction that required hospitalization: No Has patient had a PCN reaction occurring within the last 10 years: No If all of the above answers are "NO", then may proceed with Cephalosporin use.  . Sulfa Antibiotics Shortness Of Breath  . Spiriferis Other (See Comments)    Affected breathing  . Spiriva Handihaler [Tiotropium Bromide Monohydrate]     Affected breathing  . Ivp Dye  [Iodinated Diagnostic Agents] Rash and Other (See Comments)    Redness    Antimicrobials this admission: Azithromycin 2/25 >> 2/26 Cefepime 2/25 >> 2/25 Ceftriaxone 2/26 >> 2/27 Vancomycin 2/26 >> 2/27 Cefazolin 2/28 >>  Dose adjustments this admission: 2/27 vanc 1500 mg q24h > 1750 mg q24h  Microbiology results: 2/25 BCx: staph aureus - MSSA 2/26 UCx: pending  2/26 MRSA PCR: NEG COVID/Influenza A/B NEG  Thank you for allowing pharmacy to be a part of this patient's care.  Oswald Hillock, PharmD, BCPS Clinical Pharmacist 07/01/2019 8:51 AM

## 2019-07-02 ENCOUNTER — Inpatient Hospital Stay: Payer: Medicare Other

## 2019-07-02 ENCOUNTER — Inpatient Hospital Stay: Payer: Self-pay

## 2019-07-02 DIAGNOSIS — N179 Acute kidney failure, unspecified: Secondary | ICD-10-CM

## 2019-07-02 DIAGNOSIS — J9601 Acute respiratory failure with hypoxia: Secondary | ICD-10-CM

## 2019-07-02 DIAGNOSIS — I5020 Unspecified systolic (congestive) heart failure: Secondary | ICD-10-CM

## 2019-07-02 DIAGNOSIS — E1169 Type 2 diabetes mellitus with other specified complication: Secondary | ICD-10-CM

## 2019-07-02 DIAGNOSIS — E785 Hyperlipidemia, unspecified: Secondary | ICD-10-CM

## 2019-07-02 LAB — CBC
HCT: 40.7 % (ref 39.0–52.0)
Hemoglobin: 13.9 g/dL (ref 13.0–17.0)
MCH: 30.3 pg (ref 26.0–34.0)
MCHC: 34.2 g/dL (ref 30.0–36.0)
MCV: 88.9 fL (ref 80.0–100.0)
Platelets: 241 10*3/uL (ref 150–400)
RBC: 4.58 MIL/uL (ref 4.22–5.81)
RDW: 13.2 % (ref 11.5–15.5)
WBC: 10.7 10*3/uL — ABNORMAL HIGH (ref 4.0–10.5)
nRBC: 0.3 % — ABNORMAL HIGH (ref 0.0–0.2)

## 2019-07-02 LAB — BASIC METABOLIC PANEL
Anion gap: 11 (ref 5–15)
BUN: 22 mg/dL (ref 8–23)
CO2: 25 mmol/L (ref 22–32)
Calcium: 8.5 mg/dL — ABNORMAL LOW (ref 8.9–10.3)
Chloride: 102 mmol/L (ref 98–111)
Creatinine, Ser: 1.05 mg/dL (ref 0.61–1.24)
GFR calc Af Amer: >60 mL/min (ref >60)
GFR calc non Af Amer: >60 mL/min (ref >60)
Glucose, Bld: 148 mg/dL — ABNORMAL HIGH (ref 70–99)
Potassium: 3.7 mmol/L (ref 3.5–5.1)
Sodium: 138 mmol/L (ref 135–145)

## 2019-07-02 LAB — GLUCOSE, CAPILLARY
Glucose-Capillary: 123 mg/dL — ABNORMAL HIGH (ref 70–99)
Glucose-Capillary: 115 mg/dL — ABNORMAL HIGH (ref 70–99)
Glucose-Capillary: 265 mg/dL — ABNORMAL HIGH (ref 70–99)
Glucose-Capillary: 235 mg/dL — ABNORMAL HIGH (ref 70–99)

## 2019-07-02 MED ORDER — POTASSIUM CHLORIDE CRYS ER 20 MEQ PO TBCR
30.0000 meq | EXTENDED_RELEASE_TABLET | Freq: Once | ORAL | Status: AC
  Administered 2019-07-02: 30 meq via ORAL
  Filled 2019-07-02: qty 1

## 2019-07-02 MED ORDER — LOSARTAN POTASSIUM 25 MG PO TABS
25.0000 mg | ORAL_TABLET | Freq: Every day | ORAL | Status: DC
  Administered 2019-07-02 – 2019-07-05 (×4): 25 mg via ORAL
  Filled 2019-07-02 (×4): qty 1

## 2019-07-02 MED ORDER — METOPROLOL TARTRATE 50 MG PO TABS
50.0000 mg | ORAL_TABLET | Freq: Three times a day (TID) | ORAL | Status: DC
  Administered 2019-07-02 – 2019-07-03 (×5): 50 mg via ORAL
  Filled 2019-07-02 (×5): qty 1

## 2019-07-02 MED ORDER — SODIUM CHLORIDE 0.9% FLUSH
10.0000 mL | Freq: Two times a day (BID) | INTRAVENOUS | Status: DC
  Administered 2019-07-02 – 2019-07-05 (×5): 10 mL

## 2019-07-02 MED ORDER — SODIUM CHLORIDE 0.9% FLUSH: 10.0000 mL | INTRAVENOUS | Status: DC | PRN

## 2019-07-02 NOTE — Progress Notes (Signed)
Patient ID: Philip Atz., male   DOB: Jul 30, 1945, 74 y.o.   MRN: [id number] [facility] PROGRESS NOTE  Philip Potters Sr. [id number] DOB: 1945-07-28 DOA: 06/26/2019 PCP: [name], DO  HPI/Subjective: Patient still with some cough and shortness of breath.  Bringing up some greenish bloody phlegm.  Feeling better than when he came in.  Objective: Vitals:   07/02/19 1247 07/02/19 1548  BP:  110/79  Pulse:  (!) 108  Resp:  18  Temp:  98.9 F (37.2 C)  SpO2: (!) 88% 93%    Intake/Output Summary (Last 24 hours) at 07/02/2019 1652 Last data filed at 07/02/2019 1552 Gross per 24 hour  Intake 1267.41 ml  Output 2200 ml  Net -932.59 ml   Filed Weights   06/30/19 0519 07/01/19 0514 07/02/19 0348  Weight: 98.9 kg 97.3 kg 94.4 kg    ROS: Review of Systems  Constitutional: Negative for chills and fever.  Eyes: Negative for blurred vision.  Respiratory: Positive for cough, hemoptysis, shortness of breath and wheezing.   Cardiovascular: Negative for chest pain.  Gastrointestinal: Positive for diarrhea. Negative for abdominal pain, constipation, nausea and vomiting.  Genitourinary: Negative for dysuria.  Musculoskeletal: Negative for joint pain.  Neurological: Negative for dizziness and headaches.   Exam: Physical Exam  Constitutional: He is oriented to person, place, and time.  HENT:  Nose: No mucosal edema.  Mouth/Throat: No oropharyngeal exudate or posterior oropharyngeal edema.  Eyes: Pupils are equal, round, and reactive to light. Conjunctivae, EOM and lids are normal.  Neck: Carotid bruit is not present.  Cardiovascular: S1 normal and S2 normal. Exam reveals no gallop.  No murmur heard. Respiratory: No respiratory distress. He has decreased breath sounds in the right lower field and the left lower field. He has no wheezes. He has no rhonchi. He has no rales.  GI: Soft. Bowel sounds are normal. There is no abdominal tenderness.  Musculoskeletal:      Right ankle: No swelling.     Left ankle: No swelling.  Lymphadenopathy:    He has no cervical adenopathy.  Neurological: He is alert and oriented to person, place, and time. No cranial nerve deficit.  Skin: Skin is warm. No rash noted. Nails show no clubbing.  Psychiatric: He has a normal mood and affect.      Data Reviewed: Basic Metabolic Panel: Recent Labs  Lab 06/26/19 2224 06/27/19 0150 06/27/19 [id number] 06/27/19 [id number] 06/27/19 [id number] 06/28/19 0425 06/29/19 0554 06/30/19 0448 07/01/19 0537 07/02/19 0602  NA   < >  --  141   < >  --  139 137 141 141 138  K   < >  --  4.5   < >  --  4.4 4.1 4.2 3.5 3.7  CL   < >  --  111   < >  --  109 106 108 105 102  CO2   < >  --  19*   < >  --  24 21* 26 26 25   GLUCOSE   < >  --  255*   < >  --  244* 212* 137* 130* 148*  BUN   < >  --  26*   < >  --  32* 30* 27* 23 22  CREATININE   < >  --  1.52*   < >  --  1.25* 1.07 0.92 0.93 1.05  CALCIUM   < >  --  7.8*   < >  --  8.2* 8.5* 8.4* 8.2* 8.5*  MG  --  1.0*  --   --  2.5* 2.4 2.5* 2.3  --   --   PHOS  --   --  2.0*  --   --  2.1* 2.4* 2.6  --   --    < > = values in this interval not displayed.   Liver Function Tests: Recent Labs  Lab 06/26/19 2224  AST 33  ALT 24  ALKPHOS 80  BILITOT 1.0  PROT 4.9*  ALBUMIN 3.3*   CBC: Recent Labs  Lab 06/26/19 2145 06/26/19 2145 06/27/19 0508 06/27/19 0508 06/28/19 0425 06/29/19 0554 06/30/19 0448 07/01/19 0537 07/02/19 0602  WBC 4.0   < > 17.4*   < > 12.8* 17.4* 15.5* 12.6* 10.7*  NEUTROABS 2.9  --  14.9*  --   --   --   --   --   --   HGB 13.9   < > 13.5   < > 12.6* 12.9* 12.6* 14.2 13.9  HCT 40.4   < > 39.6   < > 36.6* 37.6* 35.6* 41.0 40.7  MCV 89.0   < > 91.0   < > 88.8 88.7 87.9 88.0 88.9  PLT 162   < > 216   < > 141* 162 168 223 241   < > = values in this interval not displayed.   BNP (last 3 results) Recent Labs    06/26/19 2145 06/30/19 0448  BNP 583.0* 857.0*    CBG: Recent Labs  Lab 07/01/19 1131  07/01/19 1622 07/01/19 2056 07/02/19 0731 07/02/19 1237  GLUCAP 98 143* 144* 123* 115*    Recent Results (from the past 240 hour(s))  Blood Culture (routine x 2)     Status: None   Collection Time: 06/26/19  9:45 PM   Specimen: BLOOD  Result Value Ref Range Status   Specimen Description BLOOD LEFT HAND  Final   Special Requests   Final    BOTTLES DRAWN AEROBIC AND ANAEROBIC Blood Culture adequate volume   Culture   Final    NO GROWTH 5 DAYS Performed at [facility], [address], [place], [place] [address]    Report Status 07/01/2019 FINAL  Final  Blood Culture (routine x 2)     Status: Abnormal   Collection Time: 06/26/19  9:58 PM   Specimen: BLOOD  Result Value Ref Range Status   Specimen Description   Final    BLOOD BLOOD RIGHT HAND Performed at [facility], [address] [place], [place] [address]    Special Requests   Final    BOTTLES DRAWN AEROBIC AND ANAEROBIC Blood Culture adequate volume Performed at [facility], [address], [place], [place] [address]    Culture  Setup Time   Final    GRAM POSITIVE COCCI AEROBIC BOTTLE ONLY CRITICAL RESULT CALLED TO, READ BACK BY AND VERIFIED WITH: KEYSHAWN PATEL 06/27/19 @ 1400  Coal Performed at [facility], [address] [place], [place] [address]    Culture STAPHYLOCOCCUS AUREUS (A)  Final   Report Status 06/29/2019 FINAL  Final   Organism ID, Bacteria STAPHYLOCOCCUS AUREUS  Final      Susceptibility   Staphylococcus aureus - MIC*    CIPROFLOXACIN >=8 RESISTANT Resistant     ERYTHROMYCIN 0.5 SENSITIVE Sensitive     GENTAMICIN <=0.5 SENSITIVE Sensitive     OXACILLIN 0.5 SENSITIVE Sensitive     TETRACYCLINE <=1 SENSITIVE Sensitive  VANCOMYCIN <=0.5 SENSITIVE Sensitive     TRIMETH/SULFA <=10 SENSITIVE Sensitive     CLINDAMYCIN <=0.25 SENSITIVE Sensitive     RIFAMPIN <=0.5 SENSITIVE Sensitive     Inducible Clindamycin NEGATIVE Sensitive     * STAPHYLOCOCCUS AUREUS   Respiratory Panel by RT PCR (Flu A&B, Covid) - Nasopharyngeal Swab     Status: None   Collection Time: 06/26/19 10:49 PM   Specimen: Nasopharyngeal Swab  Result Value Ref Range Status   SARS Coronavirus 2 by RT PCR NEGATIVE NEGATIVE Final    Comment: (NOTE) SARS-CoV-2 target nucleic acids are NOT DETECTED. The SARS-CoV-2 RNA is generally detectable in upper respiratoy specimens during the acute phase of infection. The lowest concentration of SARS-CoV-2 viral copies this assay can detect is 131 copies/mL. A negative result does not preclude SARS-Cov-2 infection and should not be used as the sole basis for treatment or other patient management decisions. A negative result may occur with  improper specimen collection/handling, submission of specimen other than nasopharyngeal swab, presence of viral mutation(s) within the areas targeted by this assay, and inadequate number of viral copies (<131 copies/mL). A negative result must be combined with clinical observations, patient history, and epidemiological information. The expected result is Negative. Fact Sheet for Patients:  PinkCheek.be Fact Sheet for Healthcare Providers:  GravelBags.it This test is not yet ap proved or cleared by the [place] FDA and  has been authorized for detection and/or diagnosis of SARS-CoV-2 by FDA under an Emergency Use Authorization (EUA). This EUA will remain  in effect (meaning this test can be used) for the duration of the COVID-19 declaration under Section 564(b)(1) of the Act, 21 U.S.C. section 360bbb-3(b)(1), unless the authorization is terminated or revoked sooner.    Influenza A by PCR NEGATIVE NEGATIVE Final   Influenza B by PCR NEGATIVE NEGATIVE Final    Comment: (NOTE) The Xpert Xpress SARS-CoV-2/FLU/RSV assay is intended as an aid in  the diagnosis of influenza from Nasopharyngeal swab specimens and  should not be used as a sole  basis for treatment. Nasal washings and  aspirates are unacceptable for Xpert Xpress SARS-CoV-2/FLU/RSV  testing. Fact Sheet for Patients: PinkCheek.be Fact Sheet for Healthcare Providers: GravelBags.it This test is not yet approved or cleared by the [place] FDA and  has been authorized for detection and/or diagnosis of SARS-CoV-2 by  FDA under an Emergency Use Authorization (EUA). This EUA will remain  in effect (meaning this test can be used) for the duration of the  Covid-19 declaration under Section 564(b)(1) of the Act, 21  U.S.C. section 360bbb-3(b)(1), unless the authorization is  terminated or revoked. Performed at [facility], [address], [place], [place] [address]   Urine culture     Status: None   Collection Time: 06/27/19  1:30 AM   Specimen: In/Out Cath Urine  Result Value Ref Range Status   Specimen Description   Final    IN/OUT CATH URINE Performed at [facility], [address], [place], [place] [address]    Special Requests   Final    NONE Performed at [facility], [address] [place], [place] [address]    Culture   Final    NO GROWTH Performed at [facility], [place] [address] [place], [place] [address]    Report Status 06/28/2019 FINAL  Final  Culture, sputum-assessment     Status: None   Collection Time: 06/27/19  1:30 AM   Specimen: Expectorated Sputum  Result Value  Ref Range Status   Specimen Description EXPECTORATED SPUTUM  Final   Special Requests NONE  Final   Sputum evaluation   Final    THIS SPECIMEN IS ACCEPTABLE FOR SPUTUM CULTURE Performed at [facility], [address] [place], [place] [address]    Report Status 06/27/2019 FINAL  Final  MRSA PCR Screening     Status: None   Collection Time: 06/27/19  1:30 AM   Specimen: Nasal Mucosa; Nasopharyngeal  Result Value Ref Range Status   MRSA by PCR NEGATIVE NEGATIVE Final     Comment:        The GeneXpert MRSA Assay (FDA approved for NASAL specimens only), is one component of a comprehensive MRSA colonization surveillance program. It is not intended to diagnose MRSA infection nor to guide or monitor treatment for MRSA infections. Performed at [facility], [address] [place], [place] [address]   Culture, respiratory     Status: None (Preliminary result)   Collection Time: 06/27/19  1:30 AM  Result Value Ref Range Status   Specimen Description   Final    EXPECTORATED SPUTUM Performed at [facility], [address] [place], [place] [address]    Special Requests   Final    NONE Reflexed from [id number] Performed at [facility], [place], [place] [address]    Gram Stain   Final    ABUNDANT WBC PRESENT,BOTH PMN AND MONONUCLEAR MODERATE GRAM POSITIVE COCCI FEW GRAM POSITIVE RODS RARE GRAM NEGATIVE RODS RARE YEAST MODERATE SQUAMOUS EPITHELIAL CELLS PRESENT    Culture   Final    FEW STAPHYLOCOCCUS AUREUS SUSCEPTIBILITIES TO FOLLOW CULTURE REINCUBATED FOR BETTER GROWTH Performed at [facility], [place] [address] [place], [place] [address]    Report Status PENDING  Incomplete  Group A Strep by PCR     Status: None   Collection Time: 06/27/19  2:11 AM   Specimen: Throat; Sterile Swab  Result Value Ref Range Status   Group A Strep by PCR NOT DETECTED NOT DETECTED Final    Comment: Performed at [facility], [address] [place], [place] [address]  Culture, blood (single) w Reflex to ID Panel     Status: None (Preliminary result)   Collection Time: 06/29/19  1:11 PM   Specimen: BLOOD  Result Value Ref Range Status   Specimen Description BLOOD RAC  Final   Special Requests   Final    BOTTLES DRAWN AEROBIC AND ANAEROBIC Blood Culture results may not be optimal due to an excessive volume of blood received in culture bottles   Culture   Final    NO GROWTH 3 DAYS Performed at [facility], [address], [place], [place] [address]    Report Status PENDING  Incomplete     Studies: DG Chest 2 View  Result Date: 07/01/2019 CLINICAL DATA:  Shortness of breath and productive cough. EXAM: CHEST - 2 VIEW COMPARISON:  06/28/2019 FINDINGS: The cardiac silhouette, mediastinal and hilar contours are within normal limits and stable. 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 Persistent left lower lobe airspace process most consistent with pneumonia. No interval change. IMPRESSION: Persistent left lower lobe pneumonia. Electronically Signed   By: Marijo Sanes M.D.   On: 07/01/2019 09:35   DG Chest Port 1 View  Result Date: 07/02/2019 CLINICAL DATA:  PICC line placement EXAM: PORTABLE CHEST 1 VIEW COMPARISON:  October 06, 2016 FINDINGS: There is a right-sided PICC line with tip terminating  over the SVC. The heart size is stable. Aortic calcifications are noted. There is a left basilar airspace opacity favored to represent atelectasis with an infiltrate not excluded. There is no pneumothorax. No large pleural effusion. No acute osseous abnormality. IMPRESSION: 1. Right-sided PICC line with tip terminating over the SVC. 2. Left basilar airspace opacity favored to represent atelectasis with an infiltrate not excluded. Electronically Signed   By: Constance Holster M.D.   On: 07/02/2019 15:22   [place] EKG SITE RITE  Result Date: 07/02/2019 If Site Rite image not attached, placement could not be confirmed due to current cardiac rhythm.   Scheduled Meds: . apixaban  5 mg Oral BID  . atorvastatin  40 mg Oral Daily  . budesonide (PULMICORT) nebulizer solution  0.5 mg Nebulization BID  . Chlorhexidine Gluconate Cloth  6 each Topical Daily  . diphenhydrAMINE  50 mg Oral Once   Or  . diphenhydrAMINE  50 mg Intravenous Once  . famotidine  20 mg Oral BID  . furosemide  20 mg Oral Daily  . gabapentin  100 mg Oral TID  . guaiFENesin  600 mg Oral BID  . insulin aspart  0-20 Units Subcutaneous  TID WC  . insulin aspart  0-5 Units Subcutaneous QHS  . insulin aspart  6 Units Subcutaneous TID WC  . insulin glargine  10 Units Subcutaneous QHS  . levalbuterol  0.63 mg Nebulization Q6H  . levothyroxine  88 mcg Oral Daily  . losartan  25 mg Oral Daily  . metoprolol tartrate  50 mg Oral TID  . montelukast  10 mg Oral QHS  . predniSONE  40 mg Oral Q breakfast  . Ensure Max Protein  11 oz Oral BID  . sodium chloride flush  3 mL Intravenous Q12H   Continuous Infusions: . sodium chloride Stopped (07/02/19 0658)  .  ceFAZolin (ANCEF) IV 2 g (07/02/19 1534)    Assessment/Plan:   1. Septic shock present on admission with MSSA bacteremia.  Patient is normotensive.  Repeat blood cultures negative.  TEE recommended by infectious disease.  Patient will need IV antibiotics.  Currently on IV Ancef.  Duration of therapy will depend on TEE results. 2. Acute hypoxic respiratory failure secondary to MSSA pneumonia, COPD exacerbation.  Continue steroids and nebulizer treatments.  Trying to get respiratory status is good as possible prior to TEE.  Try to taper off oxygen. 3. Chronic systolic congestive heart failure.  On oral Lasix, losartan and metoprolol. 4. Atrial fibrillation, paroxysmal but currently in A. fib.  Continue metoprolol.  On Eliquis for anticoagulation 5. Acute kidney injury.  Improved with IV fluids.  GFR now greater than 60. 6. Type 2 diabetes mellitus with hyperlipidemia on glargine insulin and sliding scale and atorvastatin  Code Status:     Code Status Orders  (From admission, onward)         Start     Ordered   06/26/19 2335  Full code  Continuous     06/26/19 2340        Code Status History    Date Active Date Inactive Code Status Order ID Comments User Context   07/23/2015 2159 07/29/2015 1654 Partial Code [id number]  Bettey Costa, MD Inpatient   05/10/2012 0733 05/13/2012 1427 Full Code [id number]  [name], MD Inpatient   05/07/2012 1322 05/10/2012 0733 Full  Code [id number]  Lucy Chris, RN Inpatient   Advance Care Planning Activity     Family Communication: Spoke with wife on the  phone Disposition Plan: Patient's respiratory status needs to be better as per cardiology before they will put the patient through a TEE.  Length of IV therapy will depend on TEE results.  In speaking with transitional care team, they may not be able to set up home health until Sunday.  Patient will be discharged home after TEE and home health set up.  Consultants:  Infectious disease  Cardiology  Antibiotics:  Ancef  Time spent: 28 minutes  [place]

## 2019-07-02 NOTE — Progress Notes (Addendum)
Physical Therapy Treatment Patient Details Name: Philip LECCE Sr. MRN: [id number] DOB: August 18, 1945 Today's Date: 07/02/2019    History of Present Illness presented to ER secondary to dyspnea, cough, fever and chills; admitted for management of acute/chronic respiratory failure secondary to CAD/COPD with L LL PNA.  Noted in Afib with RVR upon arrival to ER.    PT Comments    Pt is making good progress towards goals with ability to ambulate around RN station. All mobility performed on RA with sats decreasing to 85% on RA.  Improve to 90% on 3L of O2. Able to ambulate earlier in the day with RN staff. Good endurance with there-ex. Will continue to progress as able.    Follow Up Recommendations  No PT follow up     Equipment Recommendations  None recommended by PT    Recommendations for Other Services (pulmonary rehab)     Precautions / Restrictions Precautions Precautions: Fall Restrictions Weight Bearing Restrictions: No    Mobility  Bed Mobility Overal bed mobility: Modified Independent             General bed mobility comments: easily comes to sitting with use of railing. Safe technique  Transfers Overall transfer level: Modified independent Equipment used: None Transfers: Sit to/from Stand Sit to Stand: Supervision         General transfer comment: safe technique with upright posture.   Ambulation/Gait Ambulation/Gait assistance: Supervision Gait Distance (Feet): 200 Feet Assistive device: None Gait Pattern/deviations: WFL(Within Functional Limits)     General Gait Details: ambulated using no AD and safe technique. Does become slightly SOB with exertinon. All mobility performed on RA with sats decreasing to 85% on RA.    Stairs             Wheelchair Mobility    Modified Rankin (Stroke Patients Only)       Balance Overall balance assessment: Mild deficits observed, not formally tested Sitting-balance support: No upper extremity  supported;Feet supported Sitting balance-Leahy Scale: Good     Standing balance support: Bilateral upper extremity supported Standing balance-Leahy Scale: Fair                              Cognition Arousal/Alertness: Awake/alert Behavior During Therapy: WFL for tasks assessed/performed Overall Cognitive Status: Within Functional Limits for tasks assessed                                        Exercises Other Exercises Other Exercises: Sitting/standing ther-ex performed on B LE including alt. marching and LAQ. All ther-ex performed x 15 reps with slight LOB during standing marching. Also performed 5 time sit<>stand in 6 seconds with use of hands    General Comments        Pertinent Vitals/Pain Pain Assessment: No/denies pain    Home Living                      Prior Function            PT Goals (current goals can now be found in the care plan section) Acute Rehab PT Goals Patient Stated Goal: to get back home and be able to work again PT Goal Formulation: With patient Time For Goal Achievement: 07/11/19 Potential to Achieve Goals: Good Progress towards PT goals: Progressing toward goals    Frequency  Min 2X/week      PT Plan Current plan remains appropriate    Co-evaluation              AM-PAC PT "6 Clicks" Mobility   Outcome Measure  Help needed turning from your back to your side while in a flat bed without using bedrails?: None Help needed moving from lying on your back to sitting on the side of a flat bed without using bedrails?: None Help needed moving to and from a bed to a chair (including a wheelchair)?: None Help needed standing up from a chair using your arms (e.g., wheelchair or bedside chair)?: None Help needed to walk in hospital room?: None Help needed climbing 3-5 steps with a railing? : A Little 6 Click Score: 23    End of Session   Activity Tolerance: Patient tolerated treatment  well Patient left: in bed(seated at EOB wanting to play cards with his wife) Nurse Communication: Mobility status PT Visit Diagnosis: Difficulty in walking, not elsewhere classified (R26.2)     Time: [id number] PT Time Calculation (min) (ACUTE ONLY): 23 min  Charges:  $Gait Training: 8-22 mins $Therapeutic Exercise: 8-22 mins                     Greggory Stallion, PT, DPT [phone]    Philip [name] 07/02/2019, 4:31 PM

## 2019-07-02 NOTE — Progress Notes (Signed)
[name] INFECTIOUS DISEASE PROGRESS NOTE Date of Admission:  06/26/2019     ID: Philip Potters Sr. is a 74 y.o. male with MSSA bacteremia, PNA Active Problems:   Atrial fibrillation with rapid ventricular response (HCC)   Type 2 diabetes mellitus with hyperlipidemia (HCC)   COPD with acute exacerbation (HCC)   Shock ([place])   Community acquired pneumonia of left lower lobe of lung   Severe sepsis with septic shock (HCC)   HFrEF (heart failure with reduced ejection fraction) (HCC)   Acute respiratory failure with hypoxia (HCC)   AKI (acute kidney injury) ([place])   Subjective: No further fevers. WBC 11. [name] Sputum cx with staph aureus.  ROS  Eleven systems are reviewed and negative except per hpi  Medications:  Antibiotics Given (last 72 hours)    Date/Time Action Medication Dose Rate   06/30/19 2047 New Bag/Given   ceFAZolin (ANCEF) IVPB 2g/100 mL premix 2 g 200 mL/hr   07/01/19 0507 New Bag/Given   ceFAZolin (ANCEF) IVPB 2g/100 mL premix 2 g 200 mL/hr   07/01/19 1227 New Bag/Given   ceFAZolin (ANCEF) IVPB 2g/100 mL premix 2 g 200 mL/hr   07/01/19 2322 New Bag/Given   ceFAZolin (ANCEF) IVPB 2g/100 mL premix 2 g 200 mL/hr   07/02/19 0518 New Bag/Given   ceFAZolin (ANCEF) IVPB 2g/100 mL premix 2 g 200 mL/hr   07/02/19 1534 New Bag/Given   ceFAZolin (ANCEF) IVPB 2g/100 mL premix 2 g 200 mL/hr   07/02/19 1946 New Bag/Given   ceFAZolin (ANCEF) IVPB 2g/100 mL premix 2 g 200 mL/hr   07/03/19 0436 New Bag/Given   ceFAZolin (ANCEF) IVPB 2g/100 mL premix 2 g 200 mL/hr   07/03/19 1219 New Bag/Given   ceFAZolin (ANCEF) IVPB 2g/100 mL premix 2 g 200 mL/hr     . apixaban  5 mg Oral BID  . atorvastatin  40 mg Oral Daily  . budesonide (PULMICORT) nebulizer solution  0.5 mg Nebulization BID  . Chlorhexidine Gluconate Cloth  6 each Topical Daily  . diphenhydrAMINE  50 mg Oral Once   Or  . diphenhydrAMINE  50 mg Intravenous Once  . famotidine  20 mg Oral BID  . furosemide  20 mg  Oral Daily  . gabapentin  100 mg Oral TID  . guaiFENesin  600 mg Oral BID  . insulin aspart  0-20 Units Subcutaneous TID WC  . insulin aspart  0-5 Units Subcutaneous QHS  . insulin aspart  6 Units Subcutaneous TID WC  . insulin glargine  10 Units Subcutaneous QHS  . levalbuterol  0.63 mg Nebulization Q6H  . levothyroxine  88 mcg Oral Daily  . losartan  25 mg Oral Daily  . metoprolol tartrate  50 mg Oral TID  . montelukast  10 mg Oral QHS  . predniSONE  40 mg Oral Q breakfast  . Ensure Max Protein  11 oz Oral BID  . sodium chloride flush  10-40 mL Intracatheter Q12H    Objective: Vital signs in last 24 hours: Temp:  [97.8 F (36.6 C)-98.9 F (37.2 C)] 98.4 F (36.9 C) (03/04 1152) Pulse Rate:  [85-120] 100 (03/04 1152) Resp:  [16-20] 20 (03/04 1152) BP: (94-118)/(52-88) 94/65 (03/04 1152) SpO2:  [91 %-96 %] 93 % (03/04 1300) Weight:  [92.7 kg] 92.7 kg (03/04 0541) Physical Exam  Constitutional: sleping at time of visit. Appears comfortable. Wife at bedside. On O2.   Lab Results Recent Labs    07/02/19 0602 07/03/19 0529  WBC 10.7*  11.1*  HGB 13.9 12.7*  HCT 40.7 37.6*  NA 138 139  K 3.7 4.2  CL 102 105  CO2 25 25  BUN 22 27*  CREATININE 1.05 0.92    Microbiology: Results for orders placed or performed during the hospital encounter of 06/26/19  Blood Culture (routine x 2)     Status: None   Collection Time: 06/26/19  9:45 PM   Specimen: BLOOD  Result Value Ref Range Status   Specimen Description BLOOD LEFT HAND  Final   Special Requests   Final    BOTTLES DRAWN AEROBIC AND ANAEROBIC Blood Culture adequate volume   Culture   Final    NO GROWTH 5 DAYS Performed at [facility], [address] [place], [place] [address]    Report Status 07/01/2019 FINAL  Final  Blood Culture (routine x 2)     Status: Abnormal   Collection Time: 06/26/19  9:58 PM   Specimen: BLOOD  Result Value Ref Range Status   Specimen Description   Final    BLOOD BLOOD RIGHT  HAND Performed at [facility], [address], [place], [place] [address]    Special Requests   Final    BOTTLES DRAWN AEROBIC AND ANAEROBIC Blood Culture adequate volume Performed at [facility], [address] [place], [place] [address]    Culture  Setup Time   Final    GRAM POSITIVE COCCI AEROBIC BOTTLE ONLY CRITICAL RESULT CALLED TO, READ BACK BY AND VERIFIED WITH: KEYSHAWN PATEL 06/27/19 @ 1400  [place] Performed at [facility], [address] [place],  [address]    Culture STAPHYLOCOCCUS AUREUS (A)  Final   Report Status 06/29/2019 FINAL  Final   Organism ID, Bacteria STAPHYLOCOCCUS AUREUS  Final      Susceptibility   Staphylococcus aureus - MIC*    CIPROFLOXACIN >=8 RESISTANT Resistant     ERYTHROMYCIN 0.5 SENSITIVE Sensitive     GENTAMICIN <=0.5 SENSITIVE Sensitive     OXACILLIN 0.5 SENSITIVE Sensitive     TETRACYCLINE <=1 SENSITIVE Sensitive     VANCOMYCIN <=0.5 SENSITIVE Sensitive     TRIMETH/SULFA <=10 SENSITIVE Sensitive     CLINDAMYCIN <=0.25 SENSITIVE Sensitive     RIFAMPIN <=0.5 SENSITIVE Sensitive     Inducible Clindamycin NEGATIVE Sensitive     * STAPHYLOCOCCUS AUREUS  Respiratory Panel by RT PCR (Flu A&B, Covid) - Nasopharyngeal Swab     Status: None   Collection Time: 06/26/19 10:49 PM   Specimen: Nasopharyngeal Swab  Result Value Ref Range Status   SARS Coronavirus 2 by RT PCR NEGATIVE NEGATIVE Final    Comment: (NOTE) SARS-CoV-2 target nucleic acids are NOT DETECTED. The SARS-CoV-2 RNA is generally detectable in upper respiratoy specimens during the acute phase of infection. The lowest concentration of SARS-CoV-2 viral copies this assay can detect is 131 copies/mL. A negative result does not preclude SARS-Cov-2 infection and should not be used as the sole basis for treatment or other patient management decisions. A negative result may occur with  improper specimen collection/handling, submission of specimen  other than nasopharyngeal swab, presence of viral mutation(s) within the areas targeted by this assay, and inadequate number of viral copies (<131 copies/mL). A negative result must be combined with clinical observations, patient history, and epidemiological information. The expected result is Negative. Fact Sheet for Patients:  PinkCheek.be Fact Sheet for Healthcare Providers:  GravelBags.it This test is not yet ap proved or cleared by the [place] and  has been authorized for detection and/or diagnosis of SARS-CoV-2 by FDA under an Emergency Use Authorization (EUA). This EUA will remain  in effect (meaning this test can be used) for the duration of the COVID-19 declaration under Section 564(b)(1) of the Act, 21 U.S.C. section 360bbb-3(b)(1), unless the authorization is terminated or revoked sooner.    Influenza A by PCR NEGATIVE NEGATIVE Final   Influenza B by PCR NEGATIVE NEGATIVE Final    Comment: (NOTE) The Xpert Xpress SARS-CoV-2/FLU/RSV assay is intended as an aid in  the diagnosis of influenza from Nasopharyngeal swab specimens and  should not be used as a sole basis for treatment. Nasal washings and  aspirates are unacceptable for Xpert Xpress SARS-CoV-2/FLU/RSV  testing. Fact Sheet for Patients: PinkCheek.be Fact Sheet for Healthcare Providers: GravelBags.it This test is not yet approved or cleared by the [place] FDA and  has been authorized for detection and/or diagnosis of SARS-CoV-2 by  FDA under an Emergency Use Authorization (EUA). This EUA will remain  in effect (meaning this test can be used) for the duration of the  Covid-19 declaration under Section 564(b)(1) of the Act, 21  U.S.C. section 360bbb-3(b)(1), unless the authorization is  terminated or revoked. Performed at [facility], [address], [place], [place]  [address]   Urine culture     Status: None   Collection Time: 06/27/19  1:30 AM   Specimen: In/Out Cath Urine  Result Value Ref Range Status   Specimen Description   Final    IN/OUT CATH URINE Performed at [facility], [address] [place], [place] [address]    Special Requests   Final    NONE Performed at [facility], [address] [place], [place] [address]    Culture   Final    NO GROWTH Performed at [facility], [place] [address], [place], [place] [address]    Report Status 06/28/2019 FINAL  Final  Culture, sputum-assessment     Status: None   Collection Time: 06/27/19  1:30 AM   Specimen: Expectorated Sputum  Result Value Ref Range Status   Specimen Description EXPECTORATED SPUTUM  Final   Special Requests NONE  Final   Sputum evaluation   Final    THIS SPECIMEN IS ACCEPTABLE FOR SPUTUM CULTURE Performed at [facility], [address] [place], [place] [address]    Report Status 06/27/2019 FINAL  Final  MRSA PCR Screening     Status: None   Collection Time: 06/27/19  1:30 AM   Specimen: Nasal Mucosa; Nasopharyngeal  Result Value Ref Range Status   MRSA by PCR NEGATIVE NEGATIVE Final    Comment:        The GeneXpert MRSA Assay (FDA approved for NASAL specimens only), is one component of a comprehensive MRSA colonization surveillance program. It is not intended to diagnose MRSA infection nor to guide or monitor treatment for MRSA infections. Performed at [facility], [address] [place], [place] [address]   Culture, respiratory     Status: None   Collection Time: 06/27/19  1:30 AM  Result Value Ref Range Status   Specimen Description   Final    EXPECTORATED SPUTUM Performed at [facility], [address] [place], [place] [address]    Special Requests   Final    NONE Reflexed from [phone] Performed at [facility], [address] [place],  [address]    Gram Stain   Final  ABUNDANT WBC PRESENT,BOTH PMN AND MONONUCLEAR MODERATE GRAM POSITIVE COCCI FEW GRAM POSITIVE RODS RARE GRAM NEGATIVE RODS RARE YEAST MODERATE SQUAMOUS EPITHELIAL CELLS PRESENT Performed at [facility], [address] [place], [place] [address]    Culture FEW STAPHYLOCOCCUS AUREUS FEW CANDIDA ALBICANS   Final   Report Status 07/03/2019 FINAL  Final   Organism ID, Bacteria STAPHYLOCOCCUS AUREUS  Final      Susceptibility   Staphylococcus aureus - MIC*    CIPROFLOXACIN >=8 RESISTANT Resistant     ERYTHROMYCIN 0.5 SENSITIVE Sensitive     GENTAMICIN <=0.5 SENSITIVE Sensitive     OXACILLIN 0.5 SENSITIVE Sensitive     TETRACYCLINE <=1 SENSITIVE Sensitive     VANCOMYCIN <=0.5 SENSITIVE Sensitive     TRIMETH/SULFA <=10 SENSITIVE Sensitive     CLINDAMYCIN <=0.25 SENSITIVE Sensitive     RIFAMPIN <=0.5 SENSITIVE Sensitive     Inducible Clindamycin NEGATIVE Sensitive     * FEW STAPHYLOCOCCUS AUREUS  Group A Strep by PCR     Status: None   Collection Time: 06/27/19  2:11 AM   Specimen: Throat; Sterile Swab  Result Value Ref Range Status   Group A Strep by PCR NOT DETECTED NOT DETECTED Final    Comment: Performed at [facility], [address] [place], [place] [address]  Culture, blood (single) w Reflex to ID Panel     Status: None (Preliminary result)   Collection Time: 06/29/19  1:11 PM   Specimen: BLOOD  Result Value Ref Range Status   Specimen Description BLOOD RAC  Final   Special Requests   Final    BOTTLES DRAWN AEROBIC AND ANAEROBIC Blood Culture results may not be optimal due to an excessive volume of blood received in culture bottles   Culture   Final    NO GROWTH 4 DAYS Performed at [facility], [name], [place], [place] [address]    Report Status PENDING  Incomplete   Studies/Results: DG Chest Port 1 View  Result Date: 07/02/2019 CLINICAL DATA:  PICC line placement EXAM: PORTABLE CHEST 1 VIEW COMPARISON:  October 06, 2016 FINDINGS: There  is a right-sided PICC line with tip terminating over the SVC. The heart size is stable. Aortic calcifications are noted. There is a left basilar airspace opacity favored to represent atelectasis with an infiltrate not excluded. There is no pneumothorax. No large pleural effusion. No acute osseous abnormality. IMPRESSION: 1. Right-sided PICC line with tip terminating over the SVC. 2. Left basilar airspace opacity favored to represent atelectasis with an infiltrate not excluded. Electronically Signed   By: Constance Holster M.D.   On: 07/02/2019 15:22   [place] EKG SITE RITE  Result Date: 07/02/2019 If Site Rite image not attached, placement could not be confirmed due to current cardiac rhythm.   Assessment/Plan: Philip Potters Sr. is a 74 y.o. male with MMP including COPD, CHF, A fib, DM CAD admitted with 2 week resp illness and progressive SOB with purulent then bloody sputum. CXR with LLL opacity. BCX + MSSA.  TTE neg for veg. Apparent source of MSSA bacteremia is CAP from MSSA (this is unusual but does occur) No obvious metastatic sites of infection. Has no prosthetic joints or intravascular devices but does have hardware in C spine following surgery in past. No current back or neck pain. No joint swelling or pain.  3/2 - persistent fevers, fu bcx remain neg. Sputum cx P 3/4 - improving, wbc 11, fu bcx neg, Sputum with MSSA  Recommendations Cont cefazolin. Check TEE. PICC placed.  If TEE negative, blood cultures clear rapidly, has good clinical response and no sites of metastatic infection, could consider short 2 week course.  Thank you very much for the consult. Will follow with you.  Leonel Ramsay   07/03/2019, 1:48 PM

## 2019-07-02 NOTE — Progress Notes (Signed)
SATURATION QUALIFICATIONS: (This note is used to comply with regulatory documentation for home oxygen)  Patient Saturations on Room Air at Rest = 93%  Patient Saturations on Room Air while Ambulating = 92%  

## 2019-07-02 NOTE — Progress Notes (Signed)
PT Cancellation Note  Patient Details Name: Philip MCCUNE Sr. MRN: [id number] DOB: March 12, 1946   Cancelled Treatment:    Reason Eval/Treat Not Completed: Other (comment). Currently receiving a PICC line, unavailable for therapy. Will re-attempt another date.   Roy Snuffer 07/02/2019, 11:32 AM  Greggory Stallion, PT, DPT [phone]

## 2019-07-02 NOTE — Progress Notes (Signed)
Nutrition Follow Up Note   DOCUMENTATION CODES:   Not applicable  INTERVENTION:   Ensure Max protein supplement BID, each supplement provides 150kcal and 30g of protein.  Carbohydrate modified/3g sodium diet via Health Touch  NUTRITION DIAGNOSIS:   Increased nutrient needs related to catabolic illness(COPD) as evidenced by increased estimated needs.  GOAL:   Patient will meet greater than or equal to 90% of their needs  -progressing   MONITOR:   PO intake, Supplement acceptance, Labs, Weight trends, Skin, I & O's  ASSESSMENT:   74 yo male with a PMH of Type II Diabetes Mellitus, OSA (does not wear CPAP), Shingles, Paroxymal Atrial Fibrillation on Eliquis, Neuropathy, Hypercholesteremia, HOH, Bronchitis, Chronic Diastolic CHF (EF [id number]), CABG, CAD, COPD, Atrial Tachycardia, Asthma, and Arthritis.  He presented to [facility] ER on 02/25 via EMS with shortness of breath onset 02/25 r/t CAP AND COPD exacerbation   Pt with fair appetite and oral intake in hospital; pt ate 50% of his breakfast this morning and is documented to be eating anywhere from 25-100% of meals. Pt is also drinking some Ensure Max. RD will liberalize the heart healthy portion of pt's diet as this is restrictive of protein and pt is not eating enough to exceed nutrient limits; RD will add a sodium restriction via Health Touch. Per chart, pt down ~5lbs since admit.   Medications reviewed and include: pepcid, lasix, insulin, synthroid, prednisone, cefazolin   Labs reviewed: K 3.7 wnl, P 2.6 wnl, Mg 2.3 wnl BNP- 857(H) Wbc- 10.7(H) cbgs- 131, 98, 143, 144, 123 x 24 hrs  Diet Order:   Diet Order            Diet Carb Modified Fluid consistency: Thin; Room service appropriate? Yes  Diet effective now             EDUCATION NEEDS:   Education needs have been addressed  Skin:  Skin Assessment: Reviewed RN Assessment(ecchymosis)  Last BM:  3/3- small amount  Height:   Ht Readings from Last 1 Encounters:   06/27/19 5\' 11"  (1.803 m)    Weight:   Wt Readings from Last 1 Encounters:  07/02/19 94.4 kg    Ideal Body Weight:  78.2 kg  BMI:  Body mass index is 29.02 kg/m.  Estimated Nutritional Needs:   Kcal:  2100-2400kcal/day  Protein:  105-120g/day  Fluid:  >2L/day  Koleen Distance MS, RD, LDN Contact information available in Amion

## 2019-07-02 NOTE — Plan of Care (Signed)
Rapid heart rate continues with minimal activity out of bed.  Denies palpitations.  Normal rates on return to bed.

## 2019-07-02 NOTE — Progress Notes (Signed)
Peripherally Inserted Central Catheter/Midline Placement  The IV Nurse has discussed with the patient and/or persons authorized to consent for the patient, the purpose of this procedure and the potential benefits and risks involved with this procedure.  The benefits include less needle sticks, lab draws from the catheter, and the patient may be discharged home with the catheter. Risks include, but not limited to, infection, bleeding, blood clot (thrombus formation), and puncture of an artery; nerve damage and irregular heartbeat and possibility to perform a PICC exchange if needed/ordered by physician.  Alternatives to this procedure were also discussed.  Bard Power PICC patient education guide, fact sheet on infection prevention and patient information card has been provided to patient /or left at bedside.    PICC/Midline Placement Documentation  PICC Single Lumen [id number] PICC Right Basilic 40 cm 0 cm (Active)  Indication for Insertion or Continuance of Line Prolonged intravenous therapies 07/02/19 1214  Exposed Catheter (cm) 0 cm 07/02/19 1214  Site Assessment Clean;Dry;Intact 07/02/19 1214  Line Status Blood return noted;Flushed;Saline locked 07/02/19 1214  Dressing Type Transparent;Securing device 07/02/19 1214  Dressing Status Clean;Dry;Intact;Antimicrobial disc in place 07/02/19 1214  Dressing Change Due 07/09/19 07/02/19 1214       Frances Maywood 07/02/2019, 12:16 PM

## 2019-07-02 NOTE — Progress Notes (Signed)
Progress Note  Patient Name: Philip ZANIN Sr. Date of Encounter: 07/02/2019  Primary Cardiologist: [name], MD   Subjective   Patient still with occasional cough.  Shortness of breath has stayed about the same since yesterday.  Inpatient Medications    Scheduled Meds: . apixaban  5 mg Oral BID  . atorvastatin  40 mg Oral Daily  . budesonide (PULMICORT) nebulizer solution  0.5 mg Nebulization BID  . Chlorhexidine Gluconate Cloth  6 each Topical Daily  . diphenhydrAMINE  50 mg Oral Once   Or  . diphenhydrAMINE  50 mg Intravenous Once  . famotidine  20 mg Oral BID  . furosemide  20 mg Oral Daily  . gabapentin  100 mg Oral TID  . guaiFENesin  600 mg Oral BID  . insulin aspart  0-20 Units Subcutaneous TID WC  . insulin aspart  0-5 Units Subcutaneous QHS  . insulin aspart  6 Units Subcutaneous TID WC  . insulin glargine  10 Units Subcutaneous QHS  . levalbuterol  0.63 mg Nebulization Q6H  . levothyroxine  88 mcg Oral Daily  . losartan  25 mg Oral Daily  . metoprolol tartrate  50 mg Oral TID  . montelukast  10 mg Oral QHS  . predniSONE  40 mg Oral Q breakfast  . Ensure Max Protein  11 oz Oral BID  . sodium chloride flush  3 mL Intravenous Q12H   Continuous Infusions: . sodium chloride Stopped (07/02/19 0658)  .  ceFAZolin (ANCEF) IV Stopped (07/02/19 0556)   PRN Meds: guaiFENesin-dextromethorphan, phenol   Vital Signs    Vitals:   07/02/19 0631 07/02/19 0730 07/02/19 0737 07/02/19 1052  BP:  121/81    Pulse: (!) 116 (!) 103    Resp:  19    Temp:  99.5 F (37.5 C)  98.2 F (36.8 C)  TempSrc:  Oral  Oral  SpO2:  91% 90%   Weight:      Height:        Intake/Output Summary (Last 24 hours) at 07/02/2019 1107 Last data filed at 07/02/2019 0940 Gross per 24 hour  Intake 1147.41 ml  Output 1725 ml  Net -577.59 ml   Last 3 Weights 07/02/2019 07/01/2019 06/30/2019  Weight (lbs) 208 lb 1.6 oz 214 lb 8 oz 218 lb 1.6 oz  Weight (kg) 94.394 kg 97.297 kg 98.93 kg     Telemetry    Atrial fibrillation heart rate 120s to 140s- Personally Reviewed  ECG    No new tracing-  Physical Exam   GEN:  Mild respiratory distress, occasional cough Neck: No JVD Cardiac:  Irregular irregular, tachycardic, no murmurs, rubs, or gallops.  Respiratory:  Bilateral wheezing, rhonchi at base GI: Soft, nontender, non-distended  MS: No edema; No deformity. Neuro:  Nonfocal  Psych: Normal affect   Labs    High Sensitivity Troponin:   Recent Labs  Lab 06/26/19 2224 06/26/19 2336  TROPONINIHS 8 7      Chemistry Recent Labs  Lab 06/26/19 2224 06/27/19 0508 06/30/19 0448 07/01/19 0537 07/02/19 0602  NA 140   < > 141 141 138  K 3.2*   < > 4.2 3.5 3.7  CL 110   < > 108 105 102  CO2 21*   < > 26 26 25   GLUCOSE 194*   < > 137* 130* 148*  BUN 24*   < > 27* 23 22  CREATININE 1.44*   < > 0.92 0.93 1.05  CALCIUM 7.9*   < >  8.4* 8.2* 8.5*  PROT 4.9*  --   --   --   --   ALBUMIN 3.3*  --   --   --   --   AST 33  --   --   --   --   ALT 24  --   --   --   --   ALKPHOS 80  --   --   --   --   BILITOT 1.0  --   --   --   --   GFRNONAA 48*   < > >60 >60 >60  GFRAA 56*   < > >60 >60 >60  ANIONGAP 9   < > 7 10 11    < > = values in this interval not displayed.     Hematology Recent Labs  Lab 06/30/19 0448 07/01/19 0537 07/02/19 0602  WBC 15.5* 12.6* 10.7*  RBC 4.05* 4.66 4.58  HGB 12.6* 14.2 13.9  HCT 35.6* 41.0 40.7  MCV 87.9 88.0 88.9  MCH 31.1 30.5 30.3  MCHC 35.4 34.6 34.2  RDW 13.3 13.4 13.2  PLT 168 223 241    BNP Recent Labs  Lab 06/26/19 2145 06/30/19 0448  BNP 583.0* 857.0*     DDimer No results for input(s): DDIMER in the last 168 hours.   Radiology    DG Chest 2 View  Result Date: 07/01/2019 CLINICAL DATA:  Shortness of breath and productive cough. EXAM: CHEST - 2 VIEW COMPARISON:  06/28/2019 FINDINGS: The cardiac silhouette, mediastinal and hilar contours are within normal limits and stable. Stable surgical changes from  bypass surgery. Persistent left lower lobe airspace process most consistent with pneumonia. No interval change. IMPRESSION: Persistent left lower lobe pneumonia. Electronically Signed   By: Marijo Sanes M.D.   On: 07/01/2019 09:35   [place] EKG SITE RITE  Result Date: 07/02/2019 If Site Rite image not attached, placement could not be confirmed due to current cardiac rhythm.   Cardiac Studies   Echo 06/29/19 1. Left ventricular ejection fraction, by estimation, is 40 to 45%. The  left ventricle has mildly decreased function. The left ventricle  demonstrates global hypokinesis. The left ventricular internal cavity size  was moderately dilated. Left ventricular  diastolic parameters are indeterminate.  2. Right ventricular systolic function is mildly reduced. The right  ventricular size is mildly enlarged. There is moderately elevated  pulmonary artery systolic pressure.  3. Left atrial size was mildly dilated.  4. Right atrial size was mildly dilated.  5. The mitral valve is normal in structure and function. Mild to moderate  mitral valve regurgitation. No evidence of mitral stenosis.  6. Tricuspid valve regurgitation is mild to moderate.  7. No valve vegetation noted.  Patient Profile     74 y.o. male with history of CAD/CABG times 07/18/2012, hypertension, paroxysmal A. fib, COPD diagnosed with pneumonia and bacteremia.  Patient being seen for atrial fibrillation and TEE evaluation due to bacteremia.  Assessment & Plan    1.  Bacteremia, pneumonia -MSSA bacteremia, on antibiotics -Trans-thoracic echo with no evidence of vegetation -Patient still with wheezing, coughing.  Not safe to proceed with TEE today. -Plan for TEE when respiratory status improves.  2.  Heart failure, mild to moderately reduced EF.  EF 40 to 45% -Continue Lopressor for rate control for now.  Plan to switch to Toprol when rates are more stable. -Restart ARB when blood pressure permits, and clinical  function improves  3.  Atrial fibrillation -Currently in A.  fib with RVR -Increase Lopressor to 50 mg 3 times daily -Continue Eliquis      Signed, Kate Sable, MD  07/02/2019, 11:07 AM

## 2019-07-03 DIAGNOSIS — B9561 Methicillin susceptible Staphylococcus aureus infection as the cause of diseases classified elsewhere: Secondary | ICD-10-CM

## 2019-07-03 DIAGNOSIS — R7881 Bacteremia: Secondary | ICD-10-CM

## 2019-07-03 DIAGNOSIS — I480 Paroxysmal atrial fibrillation: Secondary | ICD-10-CM

## 2019-07-03 DIAGNOSIS — J9601 Acute respiratory failure with hypoxia: Secondary | ICD-10-CM

## 2019-07-03 DIAGNOSIS — A419 Sepsis, unspecified organism: Secondary | ICD-10-CM

## 2019-07-03 DIAGNOSIS — I5022 Chronic systolic (congestive) heart failure: Secondary | ICD-10-CM

## 2019-07-03 LAB — GLUCOSE, CAPILLARY
Glucose-Capillary: 158 mg/dL — ABNORMAL HIGH (ref 70–99)
Glucose-Capillary: 220 mg/dL — ABNORMAL HIGH (ref 70–99)
Glucose-Capillary: 314 mg/dL — ABNORMAL HIGH (ref 70–99)
Glucose-Capillary: 226 mg/dL — ABNORMAL HIGH (ref 70–99)

## 2019-07-03 LAB — CBC
HCT: 37.6 % — ABNORMAL LOW (ref 39.0–52.0)
Hemoglobin: 12.7 g/dL — ABNORMAL LOW (ref 13.0–17.0)
MCH: 30.2 pg (ref 26.0–34.0)
MCHC: 33.8 g/dL (ref 30.0–36.0)
MCV: 89.5 fL (ref 80.0–100.0)
Platelets: 225 10*3/uL (ref 150–400)
RBC: 4.2 MIL/uL — ABNORMAL LOW (ref 4.22–5.81)
RDW: 13.1 % (ref 11.5–15.5)
WBC: 11.1 10*3/uL — ABNORMAL HIGH (ref 4.0–10.5)
nRBC: 0 % (ref 0.0–0.2)

## 2019-07-03 LAB — BASIC METABOLIC PANEL
Anion gap: 9 (ref 5–15)
BUN: 27 mg/dL — ABNORMAL HIGH (ref 8–23)
CO2: 25 mmol/L (ref 22–32)
Calcium: 8.3 mg/dL — ABNORMAL LOW (ref 8.9–10.3)
Chloride: 105 mmol/L (ref 98–111)
Creatinine, Ser: 0.92 mg/dL (ref 0.61–1.24)
GFR calc Af Amer: >60 mL/min (ref >60)
GFR calc non Af Amer: >60 mL/min (ref >60)
Glucose, Bld: 182 mg/dL — ABNORMAL HIGH (ref 70–99)
Potassium: 4.2 mmol/L (ref 3.5–5.1)
Sodium: 139 mmol/L (ref 135–145)

## 2019-07-03 LAB — CULTURE, RESPIRATORY W GRAM STAIN

## 2019-07-03 MED ORDER — SODIUM CHLORIDE 0.9 % IV SOLN
INTRAVENOUS | Status: DC | PRN
  Administered 2019-07-03 – 2019-07-05 (×4): 250 mL via INTRAVENOUS

## 2019-07-03 NOTE — Progress Notes (Signed)
Infectious Disease Long Term IV Antibiotic Orders Philip HENRICHS Sr. [birth year]  Diagnosis: MSSA bacteremia and PNA  Culture results MSSA blood and sputum   LABS Lab Results  Component Value Date   CREATININE 0.92 07/03/2019   Lab Results  Component Value Date   WBC 11.1 (H) 07/03/2019   HGB 12.7 (L) 07/03/2019   HCT 37.6 (L) 07/03/2019   MCV 89.5 07/03/2019   PLT 225 07/03/2019   No results found for: ESRSEDRATE, POCTSEDRATE No results found for: CRP  Allergies:  Allergies  Allergen Reactions  . Bydureon [Exenatide] Anaphylaxis    Possible from bydureon, not 100% confirmed  . Clindamycin/Lincomycin Hives and Itching  . Penicillins Hives, Itching, Rash and Other (See Comments)    Has patient had a PCN reaction causing immediate rash, facial/tongue/throat swelling, SOB or lightheadedness with hypotension: Yes Has patient had a PCN reaction causing severe rash involving mucus membranes or skin necrosis: No Has patient had a PCN reaction that required hospitalization: No Has patient had a PCN reaction occurring within the last 10 years: No If all of the above answers are "NO", then may proceed with Cephalosporin use.  . Sulfa Antibiotics Shortness Of Breath  . Spiriferis Other (See Comments)    Affected breathing  . Spiriva Handihaler [Tiotropium Bromide Monohydrate]     Affected breathing  . Ivp Dye [Iodinated Diagnostic Agents] Rash and Other (See Comments)    Redness    Discharge antibiotics Cefazolin    2   grams every  8 hours  PICC Care per protocol Labs weekly while on IV antibiotics -FAX weekly labs to [phone] CBC w diff   Comprehensive met panel CRP   Planned duration of antibiotics 2 weeks from 2/28 neg bcx   Stop date March 14th  Follow up clinic date Prior to March 14th   Leonel Ramsay, MD

## 2019-07-03 NOTE — H&P (View-Only) (Signed)
Progress Note  Patient Name: Philip SOLANKI Sr. Date of Encounter: 07/03/2019  Primary Cardiologist: Fletcher Anon  Subjective   Productive cough persists though is improving.  Hemoptysis has improved.  Shortness of breath is about the same though significantly improved from his presentation.  No chest pain.  Inpatient Medications    Scheduled Meds: . apixaban  5 mg Oral BID  . atorvastatin  40 mg Oral Daily  . budesonide (PULMICORT) nebulizer solution  0.5 mg Nebulization BID  . Chlorhexidine Gluconate Cloth  6 each Topical Daily  . diphenhydrAMINE  50 mg Oral Once   Or  . diphenhydrAMINE  50 mg Intravenous Once  . famotidine  20 mg Oral BID  . furosemide  20 mg Oral Daily  . gabapentin  100 mg Oral TID  . guaiFENesin  600 mg Oral BID  . insulin aspart  0-20 Units Subcutaneous TID WC  . insulin aspart  0-5 Units Subcutaneous QHS  . insulin aspart  6 Units Subcutaneous TID WC  . insulin glargine  10 Units Subcutaneous QHS  . levalbuterol  0.63 mg Nebulization Q6H  . levothyroxine  88 mcg Oral Daily  . losartan  25 mg Oral Daily  . metoprolol tartrate  50 mg Oral TID  . montelukast  10 mg Oral QHS  . predniSONE  40 mg Oral Q breakfast  . Ensure Max Protein  11 oz Oral BID  . sodium chloride flush  10-40 mL Intracatheter Q12H   Continuous Infusions: . sodium chloride Stopped (07/02/19 0658)  .  ceFAZolin (ANCEF) IV Stopped (07/03/19 0504)   PRN Meds: guaiFENesin-dextromethorphan, phenol   Vital Signs    Vitals:   07/03/19 0411 07/03/19 0541 07/03/19 0724 07/03/19 0750  BP: 117/73   113/88  Pulse: 100   (!) 120  Resp: 20   20  Temp: 97.8 F (36.6 C)   97.9 F (36.6 C)  TempSrc: Oral   Oral  SpO2: 94%  92% 94%  Weight:  92.7 kg    Height:        Intake/Output Summary (Last 24 hours) at 07/03/2019 0822 Last data filed at 07/03/2019 0506 Gross per 24 hour  Intake 753.42 ml  Output 1850 ml  Net -1096.58 ml   Filed Weights   07/01/19 0514 07/02/19 0348 07/03/19  0541  Weight: 97.3 kg 94.4 kg 92.7 kg    Telemetry    A. fib with ventricular rates in the 90s to 120s bpm, occasional PVCs- Personally Reviewed  ECG    No new tracings- Personally Reviewed  Physical Exam   GEN: No acute distress.  Container of sputum noted at bedside. Neck: No JVD. Cardiac:  Irregularly irregular and tachycardic, no murmurs, rubs, or gallops.  Respiratory:  Coarse breath sounds along the bilateral bases with bilateral wheezing.  GI: Soft, nontender, non-distended.   MS: No edema; No deformity. Neuro:  Alert and oriented x 3; Nonfocal.  Psych: Normal affect.  Labs    Chemistry Recent Labs  Lab 06/26/19 2224 06/27/19 0508 07/01/19 0537 07/02/19 0602 07/03/19 0529  NA 140   < > 141 138 139  K 3.2*   < > 3.5 3.7 4.2  CL 110   < > 105 102 105  CO2 21*   < > 26 25 25   GLUCOSE 194*   < > 130* 148* 182*  BUN 24*   < > 23 22 27*  CREATININE 1.44*   < > 0.93 1.05 0.92  CALCIUM 7.9*   < >  8.2* 8.5* 8.3*  PROT 4.9*  --   --   --   --   ALBUMIN 3.3*  --   --   --   --   AST 33  --   --   --   --   ALT 24  --   --   --   --   ALKPHOS 80  --   --   --   --   BILITOT 1.0  --   --   --   --   GFRNONAA 48*   < > >60 >60 >60  GFRAA 56*   < > >60 >60 >60  ANIONGAP 9   < > 10 11 9    < > = values in this interval not displayed.     Hematology Recent Labs  Lab 07/01/19 0537 07/02/19 0602 07/03/19 0529  WBC 12.6* 10.7* 11.1*  RBC 4.66 4.58 4.20*  HGB 14.2 13.9 12.7*  HCT 41.0 40.7 37.6*  MCV 88.0 88.9 89.5  MCH 30.5 30.3 30.2  MCHC 34.6 34.2 33.8  RDW 13.4 13.2 13.1  PLT 223 241 225    Cardiac EnzymesNo results for input(s): TROPONINI in the last 168 hours. No results for input(s): TROPIPOC in the last 168 hours.   BNP Recent Labs  Lab 06/26/19 2145 06/30/19 0448  BNP 583.0* 857.0*     DDimer No results for input(s): DDIMER in the last 168 hours.   Radiology    DG Chest 2 View  Result Date: 07/01/2019 IMPRESSION: Persistent left lower  lobe pneumonia. Electronically Signed   By: Marijo Sanes M.D.   On: 07/01/2019 09:35   DG Chest Port 1 View  Result Date: 07/02/2019 IMPRESSION: 1. Right-sided PICC line with tip terminating over the SVC. 2. Left basilar airspace opacity favored to represent atelectasis with an infiltrate not excluded. Electronically Signed   By: Constance Holster M.D.   On: 07/02/2019 15:22    Cardiac Studies   2D echo 06/29/2019: 1. Left ventricular ejection fraction, by estimation, is 40 to 45%. The  left ventricle has mildly decreased function. The left ventricle  demonstrates global hypokinesis. The left ventricular internal cavity size  was moderately dilated. Left ventricular diastolic parameters are indeterminate.  2. Right ventricular systolic function is mildly reduced. The right  ventricular size is mildly enlarged. There is moderately elevated  pulmonary artery systolic pressure.  3. Left atrial size was mildly dilated.  4. Right atrial size was mildly dilated.  5. The mitral valve is normal in structure and function. Mild to moderate  mitral valve regurgitation. No evidence of mitral stenosis.  6. Tricuspid valve regurgitation is mild to moderate.  7. No valve vegetation noted.   Patient Profile     74 y.o. male with history of CAD status post three-vessel CABG in 05/2012, PAF, ectopic atrial tachycardia, HFrEF, DM2, HTN, HLD, and COPD who we are seeing for A. fib and TEE evaluation in the setting of MSSA bacteremia.  Assessment & Plan    1.  PAF with RVR: -Ventricular rates remain tachycardic at times with BP of 100/52 precluding further escalation of Lopressor -Rounding MD is considering addition of digoxin for added rate control -Continue Lopressor 50 mg 3 times daily -CHADS2VASc at least 5 (CHF, HTN, age x 1, DM, vascular disease) -Remains on Eliquis 5 mg twice daily as he does not meet reduced dosing criteria -Hemoglobin stable -TSH normal  2.  MSSA bacteremia with left  lower lobe pneumonia and  COPD exacerbation: -No evidence of obvious vegetation on 2D surface echo -Respiratory status continues to improve -Ventricular rates remain tachycardic -NPO at midnight for possible TEE on 3/5 if respiratory status and ventricular rates allow -After careful review of history and examination, the risks and benefits of transesophageal echocardiogram have been explained including risks of esophageal damage, perforation (1:10,000 risk), bleeding, pharyngeal hematoma as well as other potential complications associated with conscious sedation including aspiration, arrhythmia, respiratory failure and death. Alternatives to treatment were discussed, questions were answered. Patient is willing to proceed -Antibiotics per ID/IM  3.  CAD status post CABG without angina: -No symptoms concerning for angina -On Eliquis in place of aspirin -Continue Lipitor as below -Plan for outpatient ischemic evaluation once his acute illness has improved as outlined below  4.  HFrEF: -Prior echo from 06/2015 demonstrated low normal LV systolic function with echo this admission demonstrating an EF of 40 to 45% -Possibly tachycardia mediated -Remains on Lopressor for rate control as outlined above -Would consider transitioning to Toprol-XL prior to discharge if ventricular rates allow -Not currently able to escalate GDMT with ACE inhibitor/ARB/spironolactone secondary to relative hypotension -As able to escalate evidence-based therapy and once his rate is well controlled would recommend follow-up limited echocardiogram to reassess EF -Plan for outpatient ischemic evaluation as outlined above once his acute illness has improved  5.  HTN: -Blood pressure is well controlled to slightly soft -Continue current therapy as outlined above  6.  HLD: -Remains on Lipitor -LDL of 61 from 06/2018    For questions or updates, please contact [place] Please consult [email or web address] for contact info  under Cardiology/STEMI.    Signed, Christell Faith, [name] [place] Pager: [phone] 07/03/2019, 8:22 AM

## 2019-07-03 NOTE — Care Management Important Message (Signed)
Important Message  Patient Details  Name: Philip BROSH Sr. MRN: [id number] Date of Birth: 1945-05-09   Medicare Important Message Given:  Yes     Dannette Barbara 07/03/2019, 1:49 PM

## 2019-07-03 NOTE — Progress Notes (Signed)
Progress Note  Patient Name: Philip SCHRIMSHER Sr. Date of Encounter: 07/03/2019  Primary Cardiologist: Fletcher Anon  Subjective   Productive cough persists though is improving.  Hemoptysis has improved.  Shortness of breath is about the same though significantly improved from his presentation.  No chest pain.  Inpatient Medications    Scheduled Meds: . apixaban  5 mg Oral BID  . atorvastatin  40 mg Oral Daily  . budesonide (PULMICORT) nebulizer solution  0.5 mg Nebulization BID  . Chlorhexidine Gluconate Cloth  6 each Topical Daily  . diphenhydrAMINE  50 mg Oral Once   Or  . diphenhydrAMINE  50 mg Intravenous Once  . famotidine  20 mg Oral BID  . furosemide  20 mg Oral Daily  . gabapentin  100 mg Oral TID  . guaiFENesin  600 mg Oral BID  . insulin aspart  0-20 Units Subcutaneous TID WC  . insulin aspart  0-5 Units Subcutaneous QHS  . insulin aspart  6 Units Subcutaneous TID WC  . insulin glargine  10 Units Subcutaneous QHS  . levalbuterol  0.63 mg Nebulization Q6H  . levothyroxine  88 mcg Oral Daily  . losartan  25 mg Oral Daily  . metoprolol tartrate  50 mg Oral TID  . montelukast  10 mg Oral QHS  . predniSONE  40 mg Oral Q breakfast  . Ensure Max Protein  11 oz Oral BID  . sodium chloride flush  10-40 mL Intracatheter Q12H   Continuous Infusions: . sodium chloride Stopped (07/02/19 0658)  .  ceFAZolin (ANCEF) IV Stopped (07/03/19 0504)   PRN Meds: guaiFENesin-dextromethorphan, phenol   Vital Signs    Vitals:   07/03/19 0411 07/03/19 0541 07/03/19 0724 07/03/19 0750  BP: 117/73   113/88  Pulse: 100   (!) 120  Resp: 20   20  Temp: 97.8 F (36.6 C)   97.9 F (36.6 C)  TempSrc: Oral   Oral  SpO2: 94%  92% 94%  Weight:  92.7 kg    Height:        Intake/Output Summary (Last 24 hours) at 07/03/2019 0822 Last data filed at 07/03/2019 0506 Gross per 24 hour  Intake 753.42 ml  Output 1850 ml  Net -1096.58 ml   Filed Weights   07/01/19 0514 07/02/19 0348 07/03/19  0541  Weight: 97.3 kg 94.4 kg 92.7 kg    Telemetry    A. fib with ventricular rates in the 90s to 120s bpm, occasional PVCs- Personally Reviewed  ECG    No new tracings- Personally Reviewed  Physical Exam   GEN: No acute distress.  Container of sputum noted at bedside. Neck: No JVD. Cardiac:  Irregularly irregular and tachycardic, no murmurs, rubs, or gallops.  Respiratory:  Coarse breath sounds along the bilateral bases with bilateral wheezing.  GI: Soft, nontender, non-distended.   MS: No edema; No deformity. Neuro:  Alert and oriented x 3; Nonfocal.  Psych: Normal affect.  Labs    Chemistry Recent Labs  Lab 06/26/19 2224 06/27/19 0508 07/01/19 0537 07/02/19 0602 07/03/19 0529  NA 140   < > 141 138 139  K 3.2*   < > 3.5 3.7 4.2  CL 110   < > 105 102 105  CO2 21*   < > 26 25 25   GLUCOSE 194*   < > 130* 148* 182*  BUN 24*   < > 23 22 27*  CREATININE 1.44*   < > 0.93 1.05 0.92  CALCIUM 7.9*   < >  8.2* 8.5* 8.3*  PROT 4.9*  --   --   --   --   ALBUMIN 3.3*  --   --   --   --   AST 33  --   --   --   --   ALT 24  --   --   --   --   ALKPHOS 80  --   --   --   --   BILITOT 1.0  --   --   --   --   GFRNONAA 48*   < > >60 >60 >60  GFRAA 56*   < > >60 >60 >60  ANIONGAP 9   < > 10 11 9    < > = values in this interval not displayed.     Hematology Recent Labs  Lab 07/01/19 0537 07/02/19 0602 07/03/19 0529  WBC 12.6* 10.7* 11.1*  RBC 4.66 4.58 4.20*  HGB 14.2 13.9 12.7*  HCT 41.0 40.7 37.6*  MCV 88.0 88.9 89.5  MCH 30.5 30.3 30.2  MCHC 34.6 34.2 33.8  RDW 13.4 13.2 13.1  PLT 223 241 225    Cardiac EnzymesNo results for input(s): TROPONINI in the last 168 hours. No results for input(s): TROPIPOC in the last 168 hours.   BNP Recent Labs  Lab 06/26/19 2145 06/30/19 0448  BNP 583.0* 857.0*     DDimer No results for input(s): DDIMER in the last 168 hours.   Radiology    DG Chest 2 View  Result Date: 07/01/2019 IMPRESSION: Persistent left lower  lobe pneumonia. Electronically Signed   By: Marijo Sanes M.D.   On: 07/01/2019 09:35   DG Chest Port 1 View  Result Date: 07/02/2019 IMPRESSION: 1. Right-sided PICC line with tip terminating over the SVC. 2. Left basilar airspace opacity favored to represent atelectasis with an infiltrate not excluded. Electronically Signed   By: Constance Holster M.D.   On: 07/02/2019 15:22    Cardiac Studies   2D echo 06/29/2019: 1. Left ventricular ejection fraction, by estimation, is 40 to 45%. The  left ventricle has mildly decreased function. The left ventricle  demonstrates global hypokinesis. The left ventricular internal cavity size  was moderately dilated. Left ventricular diastolic parameters are indeterminate.  2. Right ventricular systolic function is mildly reduced. The right  ventricular size is mildly enlarged. There is moderately elevated  pulmonary artery systolic pressure.  3. Left atrial size was mildly dilated.  4. Right atrial size was mildly dilated.  5. The mitral valve is normal in structure and function. Mild to moderate  mitral valve regurgitation. No evidence of mitral stenosis.  6. Tricuspid valve regurgitation is mild to moderate.  7. No valve vegetation noted.   Patient Profile     74 y.o. male with history of CAD status post three-vessel CABG in 05/2012, PAF, ectopic atrial tachycardia, HFrEF, DM2, HTN, HLD, and COPD who we are seeing for A. fib and TEE evaluation in the setting of MSSA bacteremia.  Assessment & Plan    1.  PAF with RVR: -Ventricular rates remain tachycardic at times with BP of 100/52 precluding further escalation of Lopressor -Rounding MD is considering addition of digoxin for added rate control -Continue Lopressor 50 mg 3 times daily -CHADS2VASc at least 5 (CHF, HTN, age x 1, DM, vascular disease) -Remains on Eliquis 5 mg twice daily as he does not meet reduced dosing criteria -Hemoglobin stable -TSH normal  2.  MSSA bacteremia with left  lower lobe pneumonia and  COPD exacerbation: -No evidence of obvious vegetation on 2D surface echo -Respiratory status continues to improve -Ventricular rates remain tachycardic -NPO at midnight for possible TEE on 3/5 if respiratory status and ventricular rates allow -After careful review of history and examination, the risks and benefits of transesophageal echocardiogram have been explained including risks of esophageal damage, perforation (1:10,000 risk), bleeding, pharyngeal hematoma as well as other potential complications associated with conscious sedation including aspiration, arrhythmia, respiratory failure and death. Alternatives to treatment were discussed, questions were answered. Patient is willing to proceed -Antibiotics per ID/IM  3.  CAD status post CABG without angina: -No symptoms concerning for angina -On Eliquis in place of aspirin -Continue Lipitor as below -Plan for outpatient ischemic evaluation once his acute illness has improved as outlined below  4.  HFrEF: -Prior echo from 06/2015 demonstrated low normal LV systolic function with echo this admission demonstrating an EF of 40 to 45% -Possibly tachycardia mediated -Remains on Lopressor for rate control as outlined above -Would consider transitioning to Toprol-XL prior to discharge if ventricular rates allow -Not currently able to escalate GDMT with ACE inhibitor/ARB/spironolactone secondary to relative hypotension -As able to escalate evidence-based therapy and once his rate is well controlled would recommend follow-up limited echocardiogram to reassess EF -Plan for outpatient ischemic evaluation as outlined above once his acute illness has improved  5.  HTN: -Blood pressure is well controlled to slightly soft -Continue current therapy as outlined above  6.  HLD: -Remains on Lipitor -LDL of 61 from 06/2018    For questions or updates, please contact [place] Please consult [email or web address] for contact info  under Cardiology/STEMI.    Signed, Christell Faith, [name] [place] Pager: [phone] 07/03/2019, 8:22 AM

## 2019-07-03 NOTE — Progress Notes (Signed)
Patient ID: Philip Joya., male   DOB: 10-Mar-1946, 74 y.o.   MRN: [id number] [facility] PROGRESS NOTE  Philip [name] Sr. [id number] DOB: 11/18/1945 DOA: 06/26/2019 PCP: [name], DO  HPI/Subjective: Patient feels little bit better.  Not coughing up blood.  Still little short of breath and still cough and some wheeze.  Hopeful to get out of the hospital soon.  Objective: Vitals:   07/03/19 1152 07/03/19 1300  BP: 94/65   Pulse: 100   Resp: 20   Temp: 98.4 F (36.9 C)   SpO2: 92% 93%    Intake/Output Summary (Last 24 hours) at 07/03/2019 1444 Last data filed at 07/03/2019 0900 Gross per 24 hour  Intake 573.42 ml  Output 1850 ml  Net -1276.58 ml   Filed Weights   07/01/19 0514 07/02/19 0348 07/03/19 0541  Weight: 97.3 kg 94.4 kg 92.7 kg    ROS: Review of Systems  Constitutional: Negative for fever.  Eyes: Negative for blurred vision.  Respiratory: Positive for cough, shortness of breath and wheezing. Negative for hemoptysis.   Cardiovascular: Negative for chest pain.  Gastrointestinal: Negative for abdominal pain.  Genitourinary: Negative for dysuria.  Musculoskeletal: Negative for joint pain.  Neurological: Negative for headaches.   Exam: Physical Exam  Constitutional: He is oriented to person, place, and time.  HENT:  Nose: No mucosal edema.  Mouth/Throat: No oropharyngeal exudate or posterior oropharyngeal edema.  Eyes: Conjunctivae and lids are normal.  Neck: Carotid bruit is not present.  Cardiovascular: S1 normal and S2 normal. Exam reveals no gallop.  No murmur heard. Respiratory: No respiratory distress. He has decreased breath sounds in the right lower field and the left lower field. He has no wheezes. He has rhonchi in the right lower field and the left lower field. He has no rales.  Prior to coughing I did hear wheeze in the lungs.  After coughing the lungs sound better.  GI: Soft. Bowel sounds are normal. There is no abdominal  tenderness.  Musculoskeletal:     Right ankle: No swelling.     Left ankle: No swelling.  Lymphadenopathy:    He has no cervical adenopathy.  Neurological: He is alert and oriented to person, place, and time. No cranial nerve deficit.  Skin: Skin is warm. No rash noted. Nails show no clubbing.  Psychiatric: He has a normal mood and affect.      Data Reviewed: Basic Metabolic Panel: Recent Labs  Lab 06/26/19 2224 06/27/19 0150 06/27/19 [id number] 06/27/19 [id number] 06/27/19 [id number] 06/28/19 0425 06/28/19 0425 06/29/19 [id number] 06/30/19 0448 07/01/19 0537 07/02/19 0602 07/03/19 0529  NA   < >  --  141   < >  --  139   < > 137 141 141 138 139  K   < >  --  4.5   < >  --  4.4   < > 4.1 4.2 3.5 3.7 4.2  CL   < >  --  111   < >  --  109   < > 106 108 105 102 105  CO2   < >  --  19*   < >  --  24   < > 21* 26 26 25 25   GLUCOSE   < >  --  255*   < >  --  244*   < > 212* 137* 130* 148* 182*  BUN   < >  --  26*   < >  --  32*   < > 30* 27* 23 22 27*  CREATININE   < >  --  1.52*   < >  --  1.25*   < > 1.07 0.92 0.93 1.05 0.92  CALCIUM   < >  --  7.8*   < >  --  8.2*   < > 8.5* 8.4* 8.2* 8.5* 8.3*  MG  --  1.0*  --   --  2.5* 2.4  --  2.5* 2.3  --   --   --   PHOS  --   --  2.0*  --   --  2.1*  --  2.4* 2.6  --   --   --    < > = values in this interval not displayed.   Liver Function Tests: Recent Labs  Lab 06/26/19 2224  AST 33  ALT 24  ALKPHOS 80  BILITOT 1.0  PROT 4.9*  ALBUMIN 3.3*   CBC: Recent Labs  Lab 06/26/19 2145 06/26/19 2145 06/27/19 0508 06/28/19 0425 06/29/19 0554 06/30/19 0448 07/01/19 0537 07/02/19 0602 07/03/19 0529  WBC 4.0   < > 17.4*   < > 17.4* 15.5* 12.6* 10.7* 11.1*  NEUTROABS 2.9  --  14.9*  --   --   --   --   --   --   HGB 13.9   < > 13.5   < > 12.9* 12.6* 14.2 13.9 12.7*  HCT 40.4   < > 39.6   < > 37.6* 35.6* 41.0 40.7 37.6*  MCV 89.0   < > 91.0   < > 88.7 87.9 88.0 88.9 89.5  PLT 162   < > 216   < > 162 168 223 241 225   < > = values in this  interval not displayed.   BNP (last 3 results) Recent Labs    06/26/19 2145 06/30/19 0448  BNP 583.0* 857.0*    CBG: Recent Labs  Lab 07/02/19 1237 07/02/19 1713 07/02/19 2100 07/03/19 0753 07/03/19 1153  GLUCAP 115* 265* 235* 158* 220*    Recent Results (from the past 240 hour(s))  Blood Culture (routine x 2)     Status: None   Collection Time: 06/26/19  9:45 PM   Specimen: BLOOD  Result Value Ref Range Status   Specimen Description BLOOD LEFT HAND  Final   Special Requests   Final    BOTTLES DRAWN AEROBIC AND ANAEROBIC Blood Culture adequate volume   Culture   Final    NO GROWTH 5 DAYS Performed at [facility], [address] [place], [place] [address]    Report Status 07/01/2019 FINAL  Final  Blood Culture (routine x 2)     Status: Abnormal   Collection Time: 06/26/19  9:58 PM   Specimen: BLOOD  Result Value Ref Range Status   Specimen Description   Final    BLOOD BLOOD RIGHT HAND Performed at [facility], [address] [place], [place] [address]    Special Requests   Final    BOTTLES DRAWN AEROBIC AND ANAEROBIC Blood Culture adequate volume Performed at [facility], [address] [place], [place] [address]    Culture  Setup Time   Final    GRAM POSITIVE COCCI AEROBIC BOTTLE ONLY CRITICAL RESULT CALLED TO, READ BACK BY AND VERIFIED WITH: Weber Cooks PATEL 06/27/19 @ 1400  [place] Performed at [facility], [address] [place], [place] [address]    Culture STAPHYLOCOCCUS AUREUS (A)  Final  Report Status 06/29/2019 FINAL  Final   Organism ID, Bacteria STAPHYLOCOCCUS AUREUS  Final      Susceptibility   Staphylococcus aureus - MIC*    CIPROFLOXACIN >=8 RESISTANT Resistant     ERYTHROMYCIN 0.5 SENSITIVE Sensitive     GENTAMICIN <=0.5 SENSITIVE Sensitive     OXACILLIN 0.5 SENSITIVE Sensitive     TETRACYCLINE <=1 SENSITIVE Sensitive     VANCOMYCIN <=0.5 SENSITIVE Sensitive     TRIMETH/SULFA <=10 SENSITIVE  Sensitive     CLINDAMYCIN <=0.25 SENSITIVE Sensitive     RIFAMPIN <=0.5 SENSITIVE Sensitive     Inducible Clindamycin NEGATIVE Sensitive     * STAPHYLOCOCCUS AUREUS  Respiratory Panel by RT PCR (Flu A&B, Covid) - Nasopharyngeal Swab     Status: None   Collection Time: 06/26/19 10:49 PM   Specimen: Nasopharyngeal Swab  Result Value Ref Range Status   SARS Coronavirus 2 by RT PCR NEGATIVE NEGATIVE Final    Comment: (NOTE) SARS-CoV-2 target nucleic acids are NOT DETECTED. The SARS-CoV-2 RNA is generally detectable in upper respiratoy specimens during the acute phase of infection. The lowest concentration of SARS-CoV-2 viral copies this assay can detect is 131 copies/mL. A negative result does not preclude SARS-Cov-2 infection and should not be used as the sole basis for treatment or other patient management decisions. A negative result may occur with  improper specimen collection/handling, submission of specimen other than nasopharyngeal swab, presence of viral mutation(s) within the areas targeted by this assay, and inadequate number of viral copies (<131 copies/mL). A negative result must be combined with clinical observations, patient history, and epidemiological information. The expected result is Negative. Fact Sheet for Patients:  PinkCheek.be Fact Sheet for Healthcare Providers:  GravelBags.it This test is not yet ap proved or cleared by the [place] FDA and  has been authorized for detection and/or diagnosis of SARS-CoV-2 by FDA under an Emergency Use Authorization (EUA). This EUA will remain  in effect (meaning this test can be used) for the duration of the COVID-19 declaration under Section 564(b)(1) of the Act, 21 U.S.C. section 360bbb-3(b)(1), unless the authorization is terminated or revoked sooner.    Influenza A by PCR NEGATIVE NEGATIVE Final   Influenza B by PCR NEGATIVE NEGATIVE Final    Comment:  (NOTE) The Xpert Xpress SARS-CoV-2/FLU/RSV assay is intended as an aid in  the diagnosis of influenza from Nasopharyngeal swab specimens and  should not be used as a sole basis for treatment. Nasal washings and  aspirates are unacceptable for Xpert Xpress SARS-CoV-2/FLU/RSV  testing. Fact Sheet for Patients: PinkCheek.be Fact Sheet for Healthcare Providers: GravelBags.it This test is not yet approved or cleared by the [place] FDA and  has been authorized for detection and/or diagnosis of SARS-CoV-2 by  FDA under an Emergency Use Authorization (EUA). This EUA will remain  in effect (meaning this test can be used) for the duration of the  Covid-19 declaration under Section 564(b)(1) of the Act, 21  U.S.C. section 360bbb-3(b)(1), unless the authorization is  terminated or revoked. Performed at [facility], [address], [place], [place] [address]   Urine culture     Status: None   Collection Time: 06/27/19  1:30 AM   Specimen: In/Out Cath Urine  Result Value Ref Range Status   Specimen Description   Final    IN/OUT CATH URINE Performed at [facility], [address], [place], [place] [address]    Special Requests   Final    NONE Performed  at [facility], [address], [place], [place] [address]    Culture   Final    NO GROWTH Performed at [facility], [place] [address], [place], [place] [address]    Report Status 06/28/2019 FINAL  Final  Culture, sputum-assessment     Status: None   Collection Time: 06/27/19  1:30 AM   Specimen: Expectorated Sputum  Result Value Ref Range Status   Specimen Description EXPECTORATED SPUTUM  Final   Special Requests NONE  Final   Sputum evaluation   Final    THIS SPECIMEN IS ACCEPTABLE FOR SPUTUM CULTURE Performed at [facility], [address] [place], [place] [address]    Report Status 06/27/2019 FINAL  Final  MRSA PCR  Screening     Status: None   Collection Time: 06/27/19  1:30 AM   Specimen: Nasal Mucosa; Nasopharyngeal  Result Value Ref Range Status   MRSA by PCR NEGATIVE NEGATIVE Final    Comment:        The GeneXpert MRSA Assay (FDA approved for NASAL specimens only), is one component of a comprehensive MRSA colonization surveillance program. It is not intended to diagnose MRSA infection nor to guide or monitor treatment for MRSA infections. Performed at [facility], [address] [place], [place] [address]   Culture, respiratory     Status: None   Collection Time: 06/27/19  1:30 AM  Result Value Ref Range Status   Specimen Description   Final    EXPECTORATED SPUTUM Performed at [facility], [address] [place], [place] [address]    Special Requests   Final    NONE Reflexed from [id number] Performed at [facility], [place], [place] [address]    Gram Stain   Final    ABUNDANT WBC PRESENT,BOTH PMN AND MONONUCLEAR MODERATE GRAM POSITIVE COCCI FEW GRAM POSITIVE RODS RARE GRAM NEGATIVE RODS RARE YEAST MODERATE SQUAMOUS EPITHELIAL CELLS PRESENT Performed at [facility], [place] [address], [place], [place] [address]    Culture FEW STAPHYLOCOCCUS AUREUS FEW CANDIDA ALBICANS   Final   Report Status 07/03/2019 FINAL  Final   Organism ID, Bacteria STAPHYLOCOCCUS AUREUS  Final      Susceptibility   Staphylococcus aureus - MIC*    CIPROFLOXACIN >=8 RESISTANT Resistant     ERYTHROMYCIN 0.5 SENSITIVE Sensitive     GENTAMICIN <=0.5 SENSITIVE Sensitive     OXACILLIN 0.5 SENSITIVE Sensitive     TETRACYCLINE <=1 SENSITIVE Sensitive     VANCOMYCIN <=0.5 SENSITIVE Sensitive     TRIMETH/SULFA <=10 SENSITIVE Sensitive     CLINDAMYCIN <=0.25 SENSITIVE Sensitive     RIFAMPIN <=0.5 SENSITIVE Sensitive     Inducible Clindamycin NEGATIVE Sensitive     * FEW STAPHYLOCOCCUS AUREUS  Group A Strep by PCR     Status: None   Collection Time: 06/27/19   2:11 AM   Specimen: Throat; Sterile Swab  Result Value Ref Range Status   Group A Strep by PCR NOT DETECTED NOT DETECTED Final    Comment: Performed at [facility], [address] [place], [place] [address]  Culture, blood (single) w Reflex to ID Panel     Status: None (Preliminary result)   Collection Time: 06/29/19  1:11 PM   Specimen: BLOOD  Result Value Ref Range Status   Specimen Description BLOOD RAC  Final   Special Requests   Final    BOTTLES DRAWN AEROBIC AND ANAEROBIC Blood Culture results  may not be optimal due to an excessive volume of blood received in culture bottles   Culture   Final    NO GROWTH 4 DAYS Performed at [facility], [address] [place], [place] [address]    Report Status PENDING  Incomplete     Studies: DG Chest Port 1 View  Result Date: 07/02/2019 CLINICAL DATA:  PICC line placement EXAM: PORTABLE CHEST 1 VIEW COMPARISON:  October 06, 2016 FINDINGS: There is a right-sided PICC line with tip terminating over the SVC. The heart size is stable. Aortic calcifications are noted. There is a left basilar airspace opacity favored to represent atelectasis with an infiltrate not excluded. There is no pneumothorax. No large pleural effusion. No acute osseous abnormality. IMPRESSION: 1. Right-sided PICC line with tip terminating over the SVC. 2. Left basilar airspace opacity favored to represent atelectasis with an infiltrate not excluded. Electronically Signed   By: Constance Holster M.D.   On: 07/02/2019 15:22   [place] EKG SITE RITE  Result Date: 07/02/2019 If Site Rite image not attached, placement could not be confirmed due to current cardiac rhythm.   Scheduled Meds: . apixaban  5 mg Oral BID  . atorvastatin  40 mg Oral Daily  . budesonide (PULMICORT) nebulizer solution  0.5 mg Nebulization BID  . Chlorhexidine Gluconate Cloth  6 each Topical Daily  . diphenhydrAMINE  50 mg Oral Once   Or  . diphenhydrAMINE  50 mg Intravenous Once  .  famotidine  20 mg Oral BID  . furosemide  20 mg Oral Daily  . gabapentin  100 mg Oral TID  . guaiFENesin  600 mg Oral BID  . insulin aspart  0-20 Units Subcutaneous TID WC  . insulin aspart  0-5 Units Subcutaneous QHS  . insulin aspart  6 Units Subcutaneous TID WC  . insulin glargine  10 Units Subcutaneous QHS  . levalbuterol  0.63 mg Nebulization Q6H  . levothyroxine  88 mcg Oral Daily  . losartan  25 mg Oral Daily  . metoprolol tartrate  50 mg Oral TID  . montelukast  10 mg Oral QHS  . predniSONE  40 mg Oral Q breakfast  . Ensure Max Protein  11 oz Oral BID  . sodium chloride flush  10-40 mL Intracatheter Q12H   Continuous Infusions: . sodium chloride Stopped (07/02/19 0658)  .  ceFAZolin (ANCEF) IV 2 g (07/03/19 1219)    Assessment/Plan:   1. Septic shock present on admission with MSSA bacteremia.  Repeat blood cultures negative.  PICC line placed.  Currently on IV Ancef.  Length of therapy will depend on TEE results (2 weeks if negative and likely 4 weeks if positive).  TEE scheduled for tomorrow morning. 2. Acute hypoxic respiratory failure secondary to MSSA pneumonia, COPD exacerbation.  Continue steroids and nebulizer treatments.  Try to taper off oxygen. 3. Chronic systolic congestive heart failure.  On oral Lasix, losartan and metoprolol. 4. Atrial fibrillation, paroxysmal but currently in A. fib.  Continue metoprolol.  On Eliquis for anticoagulation. 5. Acute kidney injury.  Improved with IV fluids.  GFR now greater than 60. 6. Type 2 diabetes mellitus with hyperlipidemia on glargine insulin and sliding scale and atorvastatin  Code Status:     Code Status Orders  (From admission, onward)         Start     Ordered   06/26/19 2335  Full code  Continuous     06/26/19 2340  Code Status History    Date Active Date Inactive Code Status Order ID Comments User Context   07/23/2015 2159 07/29/2015 1654 Partial Code [id number]  Bettey Costa, MD Inpatient   05/10/2012  0733 05/13/2012 1427 Full Code [id number]  [name], MD Inpatient   05/07/2012 1322 05/10/2012 0733 Full Code [id number]  Lucy Chris, RN Inpatient   Advance Care Planning Activity     Family Communication: Spoke with wife on the phone Disposition Plan: TEE scheduled for Friday.  Patient will receive IV antibiotics here in the hospital through the 2 PM dose on Saturday.  The patient's wife will give the 10 PM dose on Saturday evening.  Home health will come out on Sunday.  Discharge home with home health on Saturday afternoon.  Consultants:  Infectious disease  Cardiology  Antibiotics:  Ancef  Time spent: 27 minutes  [place]

## 2019-07-04 ENCOUNTER — Inpatient Hospital Stay (HOSPITAL_COMMUNITY)
Admit: 2019-07-04 | Discharge: 2019-07-04 | Disposition: A | Discharge status: Home / Self Care | Payer: Medicare Other | Attending: Physician Assistant | Admitting: Physician Assistant

## 2019-07-04 ENCOUNTER — Encounter
Admission: EM | Disposition: A | Discharge status: Home Health | Payer: Self-pay | Source: Home / Self Care | Attending: Internal Medicine

## 2019-07-04 DIAGNOSIS — I361 Nonrheumatic tricuspid (valve) insufficiency: Secondary | ICD-10-CM

## 2019-07-04 DIAGNOSIS — R7881 Bacteremia: Secondary | ICD-10-CM

## 2019-07-04 DIAGNOSIS — I340 Nonrheumatic mitral (valve) insufficiency: Secondary | ICD-10-CM

## 2019-07-04 HISTORY — PX: TEE WITHOUT CARDIOVERSION: SHX5443

## 2019-07-04 LAB — CULTURE, BLOOD (SINGLE): Culture: NO GROWTH

## 2019-07-04 LAB — BASIC METABOLIC PANEL
Anion gap: 5 (ref 5–15)
BUN: 30 mg/dL — ABNORMAL HIGH (ref 8–23)
CO2: 27 mmol/L (ref 22–32)
Calcium: 8.1 mg/dL — ABNORMAL LOW (ref 8.9–10.3)
Chloride: 106 mmol/L (ref 98–111)
Creatinine, Ser: 0.89 mg/dL (ref 0.61–1.24)
GFR calc Af Amer: >60 mL/min (ref >60)
GFR calc non Af Amer: >60 mL/min (ref >60)
Glucose, Bld: 154 mg/dL — ABNORMAL HIGH (ref 70–99)
Potassium: 4 mmol/L (ref 3.5–5.1)
Sodium: 138 mmol/L (ref 135–145)

## 2019-07-04 LAB — GLUCOSE, CAPILLARY
Glucose-Capillary: 158 mg/dL — ABNORMAL HIGH (ref 70–99)
Glucose-Capillary: 133 mg/dL — ABNORMAL HIGH (ref 70–99)
Glucose-Capillary: 98 mg/dL (ref 70–99)
Glucose-Capillary: 189 mg/dL — ABNORMAL HIGH (ref 70–99)
Glucose-Capillary: 275 mg/dL — ABNORMAL HIGH (ref 70–99)

## 2019-07-04 SURGERY — ECHOCARDIOGRAM, TRANSESOPHAGEAL
Anesthesia: Moderate Sedation

## 2019-07-04 MED ORDER — FENTANYL CITRATE (PF) 100 MCG/2ML IJ SOLN
INTRAMUSCULAR | Status: AC | PRN
  Administered 2019-07-04 (×2): 25 ug via INTRAVENOUS

## 2019-07-04 MED ORDER — FUROSEMIDE 10 MG/ML IJ SOLN
20.0000 mg | Freq: Once | INTRAMUSCULAR | Status: AC
  Administered 2019-07-04: 20 mg via INTRAVENOUS
  Filled 2019-07-04: qty 2

## 2019-07-04 MED ORDER — CEFAZOLIN SODIUM-DEXTROSE 2-4 GM/100ML-% IV SOLN: 2.0000 g | Freq: Three times a day (TID) | INTRAVENOUS | 0 refills | Status: DC

## 2019-07-04 MED ORDER — NYSTATIN 100000 UNIT/ML MT SUSP
5.0000 mL | Freq: Four times a day (QID) | OROMUCOSAL | Status: DC
  Administered 2019-07-04 – 2019-07-05 (×5): 500000 [IU] via ORAL
  Filled 2019-07-04 (×5): qty 5

## 2019-07-04 MED ORDER — METOPROLOL TARTRATE 75 MG PO TABS: 75.0000 mg | ORAL_TABLET | Freq: Two times a day (BID) | ORAL | 0 refills | Status: DC

## 2019-07-04 MED ORDER — NYSTATIN 100000 UNIT/ML MT SUSP: 5.0000 mL | Freq: Four times a day (QID) | OROMUCOSAL | 0 refills | Status: DC

## 2019-07-04 MED ORDER — SODIUM CHLORIDE 0.9% FLUSH: 20.0000 mL | Freq: Three times a day (TID) | INTRAVENOUS | 0 refills | Status: DC

## 2019-07-04 MED ORDER — METOPROLOL TARTRATE 50 MG PO TABS
75.0000 mg | ORAL_TABLET | Freq: Two times a day (BID) | ORAL | Status: DC
  Administered 2019-07-04 – 2019-07-05 (×3): 75 mg via ORAL
  Filled 2019-07-04 (×3): qty 1

## 2019-07-04 MED ORDER — FENTANYL CITRATE (PF) 100 MCG/2ML IJ SOLN
INTRAMUSCULAR | Status: AC
  Filled 2019-07-04: qty 2

## 2019-07-04 MED ORDER — SODIUM CHLORIDE 0.9 % IV SOLN: INTRAVENOUS | Status: DC

## 2019-07-04 MED ORDER — MIDAZOLAM HCL 5 MG/5ML IJ SOLN
INTRAMUSCULAR | Status: AC
  Filled 2019-07-04: qty 5

## 2019-07-04 MED ORDER — LIDOCAINE VISCOUS HCL 2 % MT SOLN
OROMUCOSAL | Status: AC
  Administered 2019-07-04: 15 mL
  Filled 2019-07-04: qty 15

## 2019-07-04 MED ORDER — ENSURE MAX PROTEIN PO LIQD: 11.0000 [oz_av] | Freq: Two times a day (BID) | ORAL | 0 refills | Status: DC

## 2019-07-04 MED ORDER — CEFAZOLIN IV (FOR PTA / DISCHARGE USE ONLY): 2.0000 g | Freq: Three times a day (TID) | INTRAVENOUS | 0 refills | Status: AC

## 2019-07-04 MED ORDER — MIDAZOLAM HCL 5 MG/5ML IJ SOLN
INTRAMUSCULAR | Status: AC | PRN
  Administered 2019-07-04: 2 mg via INTRAVENOUS
  Administered 2019-07-04: 1 mg via INTRAVENOUS

## 2019-07-04 MED ORDER — IPRATROPIUM BROMIDE 0.02 % IN SOLN: 0.5000 mg | RESPIRATORY_TRACT | Status: DC | PRN

## 2019-07-04 NOTE — Progress Notes (Addendum)
PT Cancellation Note  Patient Details Name: Philip [name]. MRN: [id number] DOB: Aug 31, 1945   Cancelled Treatment:     1:52 pm; PT attempt. Pt currently getting breathing treatment and then would like to eat his lunch. Therapist will attempt to return later this date.   330pm: Therapist returned and pt sleeping with spouse at bedside requesting not to wake him. RN aware and reports pt has been Independent with mobility.    Willette Pa 07/04/2019, 1:52 PM

## 2019-07-04 NOTE — Consult Note (Signed)
Pharmacy Antibiotic Note  Philip [name]. is a 74 y.o. male admitted on 06/26/2019 with bacteremia.  Pharmacy has been consulted for Cefazolin dosing.  Blood cultures have returned MSSA bacteremia - will d/c Vancomycin and start Cefazolin.  Pt with documented cephalosporin use in recent history  Plan: Day 9 of abx. Continue Cefazolin 2g IV q8h, plan for duration of at least 2 week, depending on result of TEE.   Height: 5\' 11"  (180.3 cm) Weight: 210 lb 12.2 oz (95.6 kg) IBW/kg (Calculated) : 75.3  Temp (24hrs), Avg:98.2 F (36.8 C), Min:98 F (36.7 C), Max:98.4 F (36.9 C)  Recent Labs  Lab 06/29/19 0554 06/29/19 0554 06/30/19 0448 07/01/19 0537 07/02/19 0602 07/03/19 0529 07/04/19 0522  WBC 17.4*  --  15.5* 12.6* 10.7* 11.1*  --   CREATININE 1.07   < > 0.92 0.93 1.05 0.92 0.89   < > = values in this interval not displayed.    Estimated Creatinine Clearance: 88.5 mL/min (by C-G formula based on SCr of 0.89 mg/dL).    Allergies  Allergen Reactions  . Bydureon [Exenatide] Anaphylaxis    Possible from bydureon, not 100% confirmed  . Clindamycin/Lincomycin Hives and Itching  . Penicillins Hives, Itching, Rash and Other (See Comments)    Has patient had a PCN reaction causing immediate rash, facial/tongue/throat swelling, SOB or lightheadedness with hypotension: Yes Has patient had a PCN reaction causing severe rash involving mucus membranes or skin necrosis: No Has patient had a PCN reaction that required hospitalization: No Has patient had a PCN reaction occurring within the last 10 years: No If all of the above answers are "NO", then may proceed with Cephalosporin use.  . Sulfa Antibiotics Shortness Of Breath  . Spiriferis Other (See Comments)    Affected breathing  . Spiriva Handihaler [Tiotropium Bromide Monohydrate]     Affected breathing  . Ivp Dye [Iodinated Diagnostic Agents] Rash and Other (See Comments)    Redness    Antimicrobials this  admission: Azithromycin 2/25 >> 2/26 Cefepime 2/25 >> 2/25 Ceftriaxone 2/26 >> 2/27 Vancomycin 2/26 >> 2/27 Cefazolin 2/28 >>  Dose adjustments this admission: 2/27 vanc 1500 mg q24h > 1750 mg q24h  Microbiology results: 2/25 BCx: staph aureus - MSSA 2/26 UCx: NG  2/26 MRSA PCR: NEG 2/28 BCx: NGF COVID/Influenza A/B NEG  Thank you for allowing pharmacy to be a part of this patient's care.  Rocky Morel, PharmD, BCPS Clinical Pharmacist 07/04/2019 1:43 PM

## 2019-07-04 NOTE — Progress Notes (Signed)
*  PRELIMINARY RESULTS* Echocardiogram Echocardiogram Transesophageal has been performed.  Philip [name] [name] 07/04/2019, 10:11 AM

## 2019-07-04 NOTE — Progress Notes (Addendum)
Inpatient Diabetes Program Recommendations  AACE/ADA: New Consensus Statement on Inpatient Glycemic Control (2015)  Target Ranges:  Prepandial:   less than 140 mg/dL      Peak postprandial:   less than 180 mg/dL (1-2 hours)      Critically ill patients:  140 - 180 mg/dL   Results for UHURU, HACKING SR. (MRN [id number]) as of 07/04/2019 10:21  Ref. Range 07/03/2019 07:53 07/03/2019 11:53 07/03/2019 16:27 07/03/2019 21:00  Glucose-Capillary Latest Ref Range: 70 - 99 mg/dL 158 (H)  10 units NOVOLOG  220 (H)  13 units NOVOLOG  314 (H)  21 units NOVOLOG  226 (H)  2 units NOVOLOG +  10 units LANTUS   Results for GARNETT, GOFF SR. (MRN [id number]) as of 07/04/2019 10:21  Ref. Range 03/04/2019 08:18 06/27/2019 01:50  Hemoglobin A1C Latest Ref Range: 4.8 - 5.6 % 7.6 (H) 7.9 (H)   Home DM Meds: Metformin 1000 mg BID   Current Orders: Lantus 10 units QHS      Novolog Resistant Correction Scale/ SSI (0-20 units) TID AC + HS      Novolog 6 units TID with meals    MD- Note patient getting Prednisone 40 mg Daily which is likely contributing to in-hospital Hyperglycemia.  May consider increasing Novolog Meal Coverage to: Novolog 10 units TID with meals while patient remains on Prednisone     --Will follow patient during hospitalization--  Wyn Quaker RN, MSN, CDE Diabetes Coordinator Inpatient Glycemic Control Team Team Pager: [phone] (8a-5p)

## 2019-07-04 NOTE — Progress Notes (Signed)
Patient ID: Philip Mumaw., male   DOB: 20-Oct-1945, 74 y.o.   MRN: [id number] [facility] PROGRESS NOTE  Philip Potters Sr. [id number] DOB: [birth year] DOA: 06/26/2019 PCP: [name], DO  HPI/Subjective: Patient seen after TEE.  Hoarse voice with procedure.  Feeling better with regards to his breathing.  States he uses his nebulizers at home and usually clears that up.  Patient has thrush on his tongue.  Objective: Vitals:   07/04/19 1045 07/04/19 1118  BP: 130/86 136/88  Pulse: 90 98  Resp: 16 19  Temp:  98.4 F (36.9 C)  SpO2: 96% 92%    Intake/Output Summary (Last 24 hours) at 07/04/2019 1334 Last data filed at 07/04/2019 1045 Gross per 24 hour  Intake 537.94 ml  Output 2350 ml  Net -1812.06 ml   Filed Weights   07/03/19 0541 07/04/19 0102 07/04/19 0858  Weight: 92.7 kg 95.6 kg 95.6 kg    ROS: Review of Systems  Constitutional: Negative for fever.  Eyes: Negative for blurred vision.  Respiratory: Positive for cough. Negative for hemoptysis and shortness of breath.   Cardiovascular: Negative for chest pain.  Gastrointestinal: Negative for abdominal pain.  Genitourinary: Negative for dysuria.  Musculoskeletal: Negative for joint pain.  Neurological: Negative for headaches.   Exam: Physical Exam  Constitutional: He is oriented to person, place, and time.  HENT:  Nose: No mucosal edema.  Mouth/Throat: No oropharyngeal exudate or posterior oropharyngeal edema.  Thrush on tongue  Eyes: Conjunctivae and lids are normal.  Neck: Carotid bruit is not present.  Cardiovascular: S1 normal and S2 normal. Exam reveals no gallop.  No murmur heard. Respiratory: No respiratory distress. He has decreased breath sounds in the right lower field and the left lower field. He has no wheezes. He has rhonchi in the right lower field and the left lower field. He has no rales.  Prior to coughing I did hear wheeze in the lungs.  After coughing the lungs sound  better.  GI: Soft. Bowel sounds are normal. There is no abdominal tenderness.  Musculoskeletal:     Right ankle: No swelling.     Left ankle: No swelling.  Lymphadenopathy:    He has no cervical adenopathy.  Neurological: He is alert and oriented to person, place, and time. No cranial nerve deficit.  Skin: Skin is warm. No rash noted. Nails show no clubbing.  Psychiatric: He has a normal mood and affect.      Data Reviewed: Basic Metabolic Panel: Recent Labs  Lab 06/28/19 0425 06/28/19 0425 06/29/19 [id number] 06/29/19 [id number] 06/30/19 0448 07/01/19 [id number] 07/02/19 0602 07/03/19 0529 07/04/19 0522  NA 139   < > 137   < > 141 141 138 139 138  K 4.4   < > 4.1   < > 4.2 3.5 3.7 4.2 4.0  CL 109   < > 106   < > 108 105 102 105 106  CO2 24   < > 21*   < > 26 26 25 25 27   GLUCOSE 244*   < > 212*   < > 137* 130* 148* 182* 154*  BUN 32*   < > 30*   < > 27* 23 22 27* 30*  CREATININE 1.25*   < > 1.07   < > 0.92 0.93 1.05 0.92 0.89  CALCIUM 8.2*   < > 8.5*   < > 8.4* 8.2* 8.5* 8.3* 8.1*  MG 2.4  --  2.5*  --  2.3  --   --   --   --  PHOS 2.1*  --  2.4*  --  2.6  --   --   --   --    < > = values in this interval not displayed.   CBC: Recent Labs  Lab 06/29/19 0554 06/30/19 0448 07/01/19 0537 07/02/19 0602 07/03/19 0529  WBC 17.4* 15.5* 12.6* 10.7* 11.1*  HGB 12.9* 12.6* 14.2 13.9 12.7*  HCT 37.6* 35.6* 41.0 40.7 37.6*  MCV 88.7 87.9 88.0 88.9 89.5  PLT 162 168 223 241 225   BNP (last 3 results) Recent Labs    06/26/19 2145 06/30/19 0448  BNP 583.0* 857.0*    CBG: Recent Labs  Lab 07/03/19 1627 07/03/19 2100 07/04/19 0800 07/04/19 0901 07/04/19 1120  GLUCAP 314* 226* 158* 133* 98    Recent Results (from the past 240 hour(s))  Blood Culture (routine x 2)     Status: None   Collection Time: 06/26/19  9:45 PM   Specimen: BLOOD  Result Value Ref Range Status   Specimen Description BLOOD LEFT HAND  Final   Special Requests   Final    BOTTLES DRAWN AEROBIC AND  ANAEROBIC Blood Culture adequate volume   Culture   Final    NO GROWTH 5 DAYS Performed at [facility], [address] [place], [place] [address]    Report Status 07/01/2019 FINAL  Final  Blood Culture (routine x 2)     Status: Abnormal   Collection Time: 06/26/19  9:58 PM   Specimen: BLOOD  Result Value Ref Range Status   Specimen Description   Final    BLOOD BLOOD RIGHT HAND Performed at [facility], [address], [place], [place] [address]    Special Requests   Final    BOTTLES DRAWN AEROBIC AND ANAEROBIC Blood Culture adequate volume Performed at [facility], [address] [place], [place] [address]    Culture  Setup Time   Final    GRAM POSITIVE COCCI AEROBIC BOTTLE ONLY CRITICAL RESULT CALLED TO, READ BACK BY AND VERIFIED WITH: KEYSHAWN PATEL 06/27/19 @ 1400  [place] Performed at [facility], [address] [place], [place] [address]    Culture STAPHYLOCOCCUS AUREUS (A)  Final   Report Status 06/29/2019 FINAL  Final   Organism ID, Bacteria STAPHYLOCOCCUS AUREUS  Final      Susceptibility   Staphylococcus aureus - MIC*    CIPROFLOXACIN >=8 RESISTANT Resistant     ERYTHROMYCIN 0.5 SENSITIVE Sensitive     GENTAMICIN <=0.5 SENSITIVE Sensitive     OXACILLIN 0.5 SENSITIVE Sensitive     TETRACYCLINE <=1 SENSITIVE Sensitive     VANCOMYCIN <=0.5 SENSITIVE Sensitive     TRIMETH/SULFA <=10 SENSITIVE Sensitive     CLINDAMYCIN <=0.25 SENSITIVE Sensitive     RIFAMPIN <=0.5 SENSITIVE Sensitive     Inducible Clindamycin NEGATIVE Sensitive     * STAPHYLOCOCCUS AUREUS  Respiratory Panel by RT PCR (Flu A&B, Covid) - Nasopharyngeal Swab     Status: None   Collection Time: 06/26/19 10:49 PM   Specimen: Nasopharyngeal Swab  Result Value Ref Range Status   SARS Coronavirus 2 by RT PCR NEGATIVE NEGATIVE Final    Comment: (NOTE) SARS-CoV-2 target nucleic acids are NOT DETECTED. The SARS-CoV-2 RNA is generally detectable in upper  respiratoy specimens during the acute phase of infection. The lowest concentration of SARS-CoV-2 viral copies this assay can detect is 131 copies/mL. A negative result does not preclude SARS-Cov-2 infection and should not be used as the sole basis for  treatment or other patient management decisions. A negative result may occur with  improper specimen collection/handling, submission of specimen other than nasopharyngeal swab, presence of viral mutation(s) within the areas targeted by this assay, and inadequate number of viral copies (<131 copies/mL). A negative result must be combined with clinical observations, patient history, and epidemiological information. The expected result is Negative. Fact Sheet for Patients:  PinkCheek.be Fact Sheet for Healthcare Providers:  GravelBags.it This test is not yet ap proved or cleared by the [place] FDA and  has been authorized for detection and/or diagnosis of SARS-CoV-2 by FDA under an Emergency Use Authorization (EUA). This EUA will remain  in effect (meaning this test can be used) for the duration of the COVID-19 declaration under Section 564(b)(1) of the Act, 21 U.S.C. section 360bbb-3(b)(1), unless the authorization is terminated or revoked sooner.    Influenza A by PCR NEGATIVE NEGATIVE Final   Influenza B by PCR NEGATIVE NEGATIVE Final    Comment: (NOTE) The Xpert Xpress SARS-CoV-2/FLU/RSV assay is intended as an aid in  the diagnosis of influenza from Nasopharyngeal swab specimens and  should not be used as a sole basis for treatment. Nasal washings and  aspirates are unacceptable for Xpert Xpress SARS-CoV-2/FLU/RSV  testing. Fact Sheet for Patients: PinkCheek.be Fact Sheet for Healthcare Providers: GravelBags.it This test is not yet approved or cleared by the [place] FDA and  has been authorized for  detection and/or diagnosis of SARS-CoV-2 by  FDA under an Emergency Use Authorization (EUA). This EUA will remain  in effect (meaning this test can be used) for the duration of the  Covid-19 declaration under Section 564(b)(1) of the Act, 21  U.S.C. section 360bbb-3(b)(1), unless the authorization is  terminated or revoked. Performed at [facility], [address] [place], [place] [address]   Urine culture     Status: None   Collection Time: 06/27/19  1:30 AM   Specimen: In/Out Cath Urine  Result Value Ref Range Status   Specimen Description   Final    IN/OUT CATH URINE Performed at [facility], [address], [place], [place] [address]    Special Requests   Final    NONE Performed at [facility], [address] [place], [place] [address]    Culture   Final    NO GROWTH Performed at [facility], [place] [address] [place], [place] [address]    Report Status 06/28/2019 FINAL  Final  Culture, sputum-assessment     Status: None   Collection Time: 06/27/19  1:30 AM   Specimen: Expectorated Sputum  Result Value Ref Range Status   Specimen Description EXPECTORATED SPUTUM  Final   Special Requests NONE  Final   Sputum evaluation   Final    THIS SPECIMEN IS ACCEPTABLE FOR SPUTUM CULTURE Performed at [facility], [address] [place], [place] [address]    Report Status 06/27/2019 FINAL  Final  MRSA PCR Screening     Status: None   Collection Time: 06/27/19  1:30 AM   Specimen: Nasal Mucosa; Nasopharyngeal  Result Value Ref Range Status   MRSA by PCR NEGATIVE NEGATIVE Final    Comment:        The GeneXpert MRSA Assay (FDA approved for NASAL specimens only), is one component of a comprehensive MRSA colonization surveillance program. It is not intended to diagnose MRSA infection nor to guide or monitor treatment for MRSA infections. Performed at [facility], [address],  [place], [place] [address]   Culture,  respiratory     Status: None   Collection Time: 06/27/19  1:30 AM  Result Value Ref Range Status   Specimen Description   Final    EXPECTORATED SPUTUM Performed at [facility], [address] [place], [place] [address]    Special Requests   Final    NONE Reflexed from [id number] Performed at [facility], [place], [place] [address]    Gram Stain   Final    ABUNDANT WBC PRESENT,BOTH PMN AND MONONUCLEAR MODERATE GRAM POSITIVE COCCI FEW GRAM POSITIVE RODS RARE GRAM NEGATIVE RODS RARE YEAST MODERATE SQUAMOUS EPITHELIAL CELLS PRESENT Performed at [facility], [place] [address] [place], [place] [address]    Culture FEW STAPHYLOCOCCUS AUREUS FEW CANDIDA ALBICANS   Final   Report Status 07/03/2019 FINAL  Final   Organism ID, Bacteria STAPHYLOCOCCUS AUREUS  Final      Susceptibility   Staphylococcus aureus - MIC*    CIPROFLOXACIN >=8 RESISTANT Resistant     ERYTHROMYCIN 0.5 SENSITIVE Sensitive     GENTAMICIN <=0.5 SENSITIVE Sensitive     OXACILLIN 0.5 SENSITIVE Sensitive     TETRACYCLINE <=1 SENSITIVE Sensitive     VANCOMYCIN <=0.5 SENSITIVE Sensitive     TRIMETH/SULFA <=10 SENSITIVE Sensitive     CLINDAMYCIN <=0.25 SENSITIVE Sensitive     RIFAMPIN <=0.5 SENSITIVE Sensitive     Inducible Clindamycin NEGATIVE Sensitive     * FEW STAPHYLOCOCCUS AUREUS  Group A Strep by PCR     Status: None   Collection Time: 06/27/19  2:11 AM   Specimen: Throat; Sterile Swab  Result Value Ref Range Status   Group A Strep by PCR NOT DETECTED NOT DETECTED Final    Comment: Performed at [facility], [address] [place], [place] [address]  Culture, blood (single) w Reflex to ID Panel     Status: None   Collection Time: 06/29/19  1:11 PM   Specimen: BLOOD  Result Value Ref Range Status   Specimen Description BLOOD RAC  Final   Special Requests   Final    BOTTLES DRAWN AEROBIC AND ANAEROBIC Blood Culture results may not be optimal due to an  excessive volume of blood received in culture bottles   Culture   Final    NO GROWTH 5 DAYS Performed at [facility], [address] [place], [place] [address]    Report Status 07/04/2019 FINAL  Final     Studies: DG Chest Port 1 View  Result Date: 07/02/2019 CLINICAL DATA:  PICC line placement EXAM: PORTABLE CHEST 1 VIEW COMPARISON:  October 06, 2016 FINDINGS: There is a right-sided PICC line with tip terminating over the SVC. The heart size is stable. Aortic calcifications are noted. There is a left basilar airspace opacity favored to represent atelectasis with an infiltrate not excluded. There is no pneumothorax. No large pleural effusion. No acute osseous abnormality. IMPRESSION: 1. Right-sided PICC line with tip terminating over the SVC. 2. Left basilar airspace opacity favored to represent atelectasis with an infiltrate not excluded. Electronically Signed   By: Constance Holster M.D.   On: 07/02/2019 15:22   ECHO TEE  Result Date: 07/04/2019    TRANSESOPHOGEAL ECHO REPORT   Patient Name:   Philip TZINTZUN Sr. Date of Exam: 07/04/2019 Medical Rec #:  [id number]          Height:       71.0 in Accession #:  [id number]         Weight:       210.8 lb Date of Birth:  1945/10/08         BSA:          2.156 m Patient Age:    2 years           BP:           120/69 mmHg Patient Gender: M                  HR:           114 bpm. Exam Location:  [facility] Procedure: Color Doppler, Cardiac Doppler, Transesophageal Echo and 3D Echo Indications:     R78.81 Bacteremia  History:         Patient has prior history of Echocardiogram examinations.                  Pneumonia.  Sonographer:     [place] (AE) Referring Phys:  [id number] Rise Mu Diagnosing Phys: Nelva Bush MD PROCEDURE: After discussion of the risks and benefits of a TEE, an informed consent was obtained from the patient. The transesophogeal probe was passed without difficulty through the esophogus of the patient. Local oropharyngeal  anesthetic was provided with viscous lidocaine. Sedation performed by performing physician. Image quality was good. The patient's vital signs; including heart rate, blood pressure, and oxygen saturation; remained stable throughout the procedure. The patient developed no complications during the procedure. IMPRESSIONS  1. Left ventricular ejection fraction, by estimation, is 30 to 35%. The left ventricle has moderately decreased function. The left ventricle demonstrates global hypokinesis.  2. Right ventricular systolic function is mildly reduced. The right ventricular size is mildly enlarged.  3. No left atrial/left atrial appendage thrombus was detected.  4. The mitral valve is degenerative. Mild to moderate mitral valve regurgitation. No evidence of mitral stenosis.  5. The aortic valve is tricuspid. Aortic valve regurgitation is not visualized. Mild aortic valve sclerosis is present, with no evidence of aortic valve stenosis.  6. There is mild (Grade II) plaque involving the descending aorta. Conclusion(s)/Recommendation(s): No evidence of vegetation/infective endocarditis on this transesophageal echocardiogram. FINDINGS  Left Ventricle: Left ventricular ejection fraction, by estimation, is 30 to 35%. The left ventricle has moderately decreased function. The left ventricle demonstrates global hypokinesis. The left ventricular internal cavity size was normal in size. There is no left ventricular hypertrophy. Right Ventricle: The right ventricular size is mildly enlarged. Right vetricular wall thickness was not assessed. Right ventricular systolic function is mildly reduced. Left Atrium: Left atrial size was not assessed. No left atrial/left atrial appendage thrombus was detected. Right Atrium: Right atrial size was not assessed. Pericardium: There is no evidence of pericardial effusion. Mitral Valve: The mitral valve is degenerative in appearance. Mild to moderate mitral valve regurgitation. No evidence of mitral  valve stenosis. MV peak gradient, 86.5 mmHg. The mean mitral valve gradient is 44.0 mmHg. There is no evidence of mitral valve vegetation. Tricuspid Valve: The tricuspid valve is normal in structure. Tricuspid valve regurgitation is mild. There is no evidence of tricuspid valve vegetation. Aortic Valve: The aortic valve is tricuspid. . There is mild thickening of the aortic valve. Aortic valve regurgitation is not visualized. Mild aortic valve sclerosis is present, with no evidence of aortic valve stenosis. There is mild thickening of the aortic valve. There is no evidence of aortic valve vegetation. Pulmonic Valve: The pulmonic valve was normal in structure.  Pulmonic valve regurgitation is trivial. There is no evidence of pulmonic valve vegetation. Aorta: The aortic root and ascending aorta are structurally normal, with no evidence of dilitation. There is mild (Grade II) plaque involving the descending aorta. IAS/Shunts: No atrial level shunt detected by color flow Doppler.  MITRAL VALVE MV Peak grad: 86.5 mmHg MV Mean grad: 44.0 mmHg MV Vmax:      4.65 m/s MV Vmean:     281.0 cm/s MR PISA:        1.24 cm MR PISA Radius: 0.44 cm Nelva Bush MD Electronically signed by Nelva Bush MD Signature Date/Time: 07/04/2019/10:37:35 AM    Final     Scheduled Meds: . apixaban  5 mg Oral BID  . atorvastatin  40 mg Oral Daily  . budesonide (PULMICORT) nebulizer solution  0.5 mg Nebulization BID  . Chlorhexidine Gluconate Cloth  6 each Topical Daily  . diphenhydrAMINE  50 mg Oral Once   Or  . diphenhydrAMINE  50 mg Intravenous Once  . famotidine  20 mg Oral BID  . fentaNYL      . gabapentin  100 mg Oral TID  . guaiFENesin  600 mg Oral BID  . insulin aspart  0-20 Units Subcutaneous TID WC  . insulin aspart  0-5 Units Subcutaneous QHS  . insulin aspart  6 Units Subcutaneous TID WC  . insulin glargine  10 Units Subcutaneous QHS  . levalbuterol  0.63 mg Nebulization Q6H  . levothyroxine  88 mcg Oral Daily   . losartan  25 mg Oral Daily  . metoprolol tartrate  75 mg Oral BID  . midazolam      . montelukast  10 mg Oral QHS  . nystatin  5 mL Oral QID  . Ensure Max Protein  11 oz Oral BID  . sodium chloride flush  10-40 mL Intracatheter Q12H   Continuous Infusions: . sodium chloride Stopped (07/02/19 0658)  . sodium chloride 250 mL (07/04/19 0526)  .  ceFAZolin (ANCEF) IV 2 g (07/04/19 1206)    Assessment/Plan:   1. Septic shock present on admission with MSSA bacteremia.  Repeat blood cultures negative.  PICC line placed.  Currently on IV Ancef.  Patient will be discharged home on IV Ancef through 07/13/2019 as per infectious disease a total of 2 weeks.  Patient will be discharged home tomorrow after 2 PM dose of Ancef.  Family will do the 10 PM dose and home health will be out on Sunday. 2. Acute hypoxic respiratory failure secondary to MSSA pneumonia, COPD exacerbation.  Continue steroids and nebulizer treatments.  Patient tapered off oxygen.  Patient will have a slow steroid taper. 3. Chronic systolic congestive heart failure.  On oral Lasix, losartan and metoprolol. 4. Atrial fibrillation, paroxysmal but currently in A. fib.  Continue metoprolol.  On Eliquis for anticoagulation. 5. Acute kidney injury.  Improved with IV fluids.  GFR now greater than 60. 6. Type 2 diabetes mellitus with hyperlipidemia on glargine insulin and sliding scale and atorvastatin.  Will speak with the patient on what he wants to do with diabetes medications as of tomorrow  Code Status:     Code Status Orders  (From admission, onward)         Start     Ordered   06/26/19 2335  Full code  Continuous     02 /25/21 2340        Code Status History    Date Active Date Inactive Code Status Order ID Comments User Context  07/23/2015 2159 07/29/2015 1654 Partial Code [id number]  Bettey Costa, MD Inpatient   05/10/2012 0733 05/13/2012 1427 Full Code [id number]  [name], MD Inpatient   05/07/2012 1322 05/10/2012  0733 Full Code [id number]  Lucy Chris, RN Inpatient   Advance Care Planning Activity     Family Communication: Spoke with wife on the phone Disposition Plan: Patient will receive IV antibiotics here in the hospital through the 2 PM dose on Saturday.  Discharge home with home health on Saturday afternoon.  Home health will follow up with him on Sunday morning.  Consultants:  Infectious disease  Cardiology  Antibiotics:  Ancef  Time spent: 28 minutes.   [place]  [facility] [employer]

## 2019-07-04 NOTE — CV Procedure (Signed)
    Transesophageal Echocardiogram Note  Eliezar Hockaday [id number] [birth year]  Procedure: Transesophageal Echocardiogram Indications: MSSA bacteremia  Procedure Details Consent: Obtained Time Out: Verified patient identification, verified procedure, site/side was marked, verified correct patient position, special equipment/implants available, Radiology Safety Procedures followed,  medications/allergies/relevent history reviewed, required imaging and test results available.  Performed  Medications:  During this procedure the patient is administered a total of Versed 3 mg and Fentanyl 50 mcg  to achieve and maintain moderate conscious sedation.  The patient's heart rate, blood pressure, and oxygen saturation are monitored continuously during the procedure. The period of conscious sedation is 30 minutes, of which I was present face-to-face 100% of this time.  Preliminary findings (see final echo report for details):  Left Ventrical:  Normal size with moderately to severely reduced systolic function ([id number] [id number]).  Mitral Valve: Mildly thickened with mild to moderate MR.  No vegetation.  Aortic Valve: Mildly thickened without vegetation or significant regurgitation.  Tricuspid Valve: Normal with mild regurgitation.  No vegetation.  Pulmonic Valve: Normal with trivial regurgitation.  No vegetation.  Left Atrium/ Left atrial appendage: Relatively small LAE without thrombus.  Atrial septum: Intact by color Doppler.  Aorta: Normal sized aortic root and ascending aorta.  Mild to moderate plaquing of the descending aorta.  Complications: No apparent complications Patient did tolerate procedure well.  Nelva Bush, MD 07/04/2019, 10:17 AM

## 2019-07-04 NOTE — Progress Notes (Signed)
Progress Note  Patient Name: Philip [name] Sr. Date of Encounter: 07/04/2019  Primary Cardiologist: Fletcher Anon  Subjective   Dyspnea continues to improve. Productive cough improving. No further hemoptysis. No chest pain or palpitations. He remains in Afib with improved and controlled ventricular response. NPO for TEE this morning.   Inpatient Medications    Scheduled Meds: . apixaban  5 mg Oral BID  . atorvastatin  40 mg Oral Daily  . budesonide (PULMICORT) nebulizer solution  0.5 mg Nebulization BID  . Chlorhexidine Gluconate Cloth  6 each Topical Daily  . diphenhydrAMINE  50 mg Oral Once   Or  . diphenhydrAMINE  50 mg Intravenous Once  . famotidine  20 mg Oral BID  . furosemide  20 mg Oral Daily  . gabapentin  100 mg Oral TID  . guaiFENesin  600 mg Oral BID  . insulin aspart  0-20 Units Subcutaneous TID WC  . insulin aspart  0-5 Units Subcutaneous QHS  . insulin aspart  6 Units Subcutaneous TID WC  . insulin glargine  10 Units Subcutaneous QHS  . levalbuterol  0.63 mg Nebulization Q6H  . levothyroxine  88 mcg Oral Daily  . losartan  25 mg Oral Daily  . metoprolol tartrate  50 mg Oral TID  . montelukast  10 mg Oral QHS  . predniSONE  40 mg Oral Q breakfast  . Ensure Max Protein  11 oz Oral BID  . sodium chloride flush  10-40 mL Intracatheter Q12H   Continuous Infusions: . sodium chloride Stopped (07/02/19 0658)  . sodium chloride 250 mL (07/04/19 0526)  .  ceFAZolin (ANCEF) IV 2 g (07/04/19 0527)   PRN Meds: sodium chloride, guaiFENesin-dextromethorphan, phenol   Vital Signs    Vitals:   07/03/19 1940 07/04/19 0102 07/04/19 0256 07/04/19 0425  BP:    108/79  Pulse:    91  Resp:    19  Temp:    98.2 F (36.8 C)  TempSrc:    Oral  SpO2: 94%  98% 99%  Weight:  95.6 kg    Height:        Intake/Output Summary (Last 24 hours) at 07/04/2019 0654 Last data filed at 07/04/2019 [id number] Gross per 24 hour  Intake 777.94 ml  Output 1900 ml  Net -1122.06 ml   Filed  Weights   07/02/19 0348 07/03/19 0541 07/04/19 0102  Weight: 94.4 kg 92.7 kg 95.6 kg    Telemetry    Afib, 70s-80s bpm - Personally Reviewed  ECG    No new tracings - Personally Reviewed  Physical Exam   GEN: No acute distress.   Neck: No JVD. Cardiac: Irregularly irregular, no murmurs, rubs, or gallops.  Respiratory: Coarse breath sounds bilaterally.  GI: Soft, nontender, non-distended.   MS: Trace bilateral ankle edema; No deformity. Neuro:  Alert and oriented x 3; Nonfocal.  Psych: Normal affect.  Labs    Chemistry Recent Labs  Lab 07/02/19 0602 07/03/19 0529 07/04/19 0522  NA 138 139 138  K 3.7 4.2 4.0  CL 102 105 106  CO2 25 25 27   GLUCOSE 148* 182* 154*  BUN 22 27* 30*  CREATININE 1.05 0.92 0.89  CALCIUM 8.5* 8.3* 8.1*  GFRNONAA >60 >60 >60  GFRAA >60 >60 >60  ANIONGAP 11 9 5      Hematology Recent Labs  Lab 07/01/19 0537 07/02/19 0602 07/03/19 0529  WBC 12.6* 10.7* 11.1*  RBC 4.66 4.58 4.20*  HGB 14.2 13.9 12.7*  HCT 41.0 40.7  37.6*  MCV 88.0 88.9 89.5  MCH 30.5 30.3 30.2  MCHC 34.6 34.2 33.8  RDW 13.4 13.2 13.1  PLT 223 241 225    Cardiac EnzymesNo results for input(s): TROPONINI in the last 168 hours. No results for input(s): TROPIPOC in the last 168 hours.   BNP Recent Labs  Lab 06/30/19 0448  BNP 857.0*     DDimer No results for input(s): DDIMER in the last 168 hours.   Radiology    DG Chest Port 1 View  Result Date: 07/02/2019 IMPRESSION: 1. Right-sided PICC line with tip terminating over the SVC. 2. Left basilar airspace opacity favored to represent atelectasis with an infiltrate not excluded. Electronically Signed   By: Constance Holster M.D.   On: 07/02/2019 15:22    Cardiac Studies   2D echo 06/29/2019: 1. Left ventricular ejection fraction, by estimation, is 40 to 45%. The left ventricle has mildly decreased function. The left ventricle demonstrates global hypokinesis. The left ventricular internal cavity size was  moderately dilated. Left ventricular diastolic parameters are indeterminate.  2. Right ventricular systolic function is mildly reduced. The right ventricular size is mildly enlarged. There is moderately elevated pulmonary artery systolic pressure.  3. Left atrial size was mildly dilated.  4. Right atrial size was mildly dilated.  5. The mitral valve is normal in structure and function. Mild to moderate  mitral valve regurgitation. No evidence of mitral stenosis.  6. Tricuspid valve regurgitation is mild to moderate.  7. No valve vegetation noted.   Patient Profile     74 y.o. male with history of CAD status post three-vessel CABG in 05/2012, PAF, ectopic atrial tachycardia, HFrEF, DM2, HTN, HLD, and COPD who we are seeing for A. fib and TEE evaluation in the setting of MSSA bacteremia.  Assessment & Plan    1. PAF with RVR: -Ventricular rates well controlled in the 80s to 90s bpm -Consolidate Lopressor from 50 mg tib to 75 mg bid -Rounding MD is considering addition of digoxin for added rate control -CHADS2VASc at least 5 (CHF, HTN, age x 1, DM, vascular disease) -Remains on Eliquis 5 mg twice daily as he does not meet reduced dosing criteria -No symptoms of bleeding -Hemoglobin stable -TSH normal  2.  MSSA bacteremia with left lower lobe pneumonia and COPD exacerbation: -No evidence of obvious vegetation on 2D surface echo -Respiratory status continues to improve -Ventricular rates remain tachycardic -NPO for TEE this morning  -After careful review of history and examination, the risks and benefits of transesophageal echocardiogram have been explained including risks of esophageal damage, perforation (1:10,000 risk), bleeding, pharyngeal hematoma as well as other potential complications associated with conscious sedation including aspiration, arrhythmia, respiratory failure and death. Alternatives to treatment were discussed, questions were answered. Patient is willing to  proceed -Antibiotics per ID/IM  3.  CAD status post CABG without angina: -No symptoms concerning for angina -On Eliquis in place of aspirin -Continue Lipitor as below -Plan for outpatient ischemic evaluation once his acute illness has improved as outlined below  4.  HFrEF: -Prior echo from 06/2015 demonstrated low normal LV systolic function with echo this admission demonstrating an EF of 40 to 45% -Possibly tachycardia mediated -Remains on Lopressor for rate control as outlined above -Would consider transitioning to Toprol-XL prior to discharge if ventricular rates allow -Give a one-time dose of IV Lasix 40 mg -Not currently able to escalate GDMT with ACE inhibitor/ARB/spironolactone secondary to relative hypotension -As able to escalate evidence-based therapy and once his  rate is well controlled would recommend follow-up limited echocardiogram to reassess EF -Plan for outpatient ischemic evaluation as outlined above once his acute illness has improved  5.  HTN: -Blood pressure is well controlled -Continue current therapy as outlined above  6.  HLD: -Remains on Lipitor -LDL of 61 from 06/2018  For questions or updates, please contact [place] Please consult [email or web address] for contact info under Cardiology/STEMI.    Signed, Christell Faith, [name] [place] Pager: [phone] 07/04/2019, 6:54 AM

## 2019-07-04 NOTE — Interval H&P Note (Signed)
History and Physical Interval Note:  07/04/2019 8:54 AM  Philip Potters Sr.  has presented today for surgery, with the diagnosis of methicillin sensitive stahphylococcus aureus bacteremia.  The various methods of treatment have been discussed with the patient and family. After consideration of risks, benefits and other options for treatment, the patient has consented to  Procedure(s): TRANSESOPHAGEAL ECHOCARDIOGRAM (TEE) (N/A) as a surgical intervention.  The patient's history has been reviewed, patient examined, no change in status, stable for surgery.  I have reviewed the patient's chart and labs.  Questions were answered to the patient's satisfaction.     [name]

## 2019-07-04 NOTE — Progress Notes (Addendum)
PHARMACY CONSULT NOTE FOR:  OUTPATIENT  PARENTERAL ANTIBIOTIC THERAPY (OPAT)  Indication: MSSA bacteremia Regimen: Cefazolin 2gm IV q8h End date: 07/13/2019  IV antibiotic discharge orders are pended. To discharging provider:  please sign these orders via discharge navigator,  Select New Orders & click on the button choice - Manage This Unsigned Work.     Thank you for allowing pharmacy to be a part of this patient's care.  Doreene Eland, PharmD, BCPS.   Work Cell: [phone] 07/04/2019 1:29 PM

## 2019-07-05 DIAGNOSIS — J189 Pneumonia, unspecified organism: Secondary | ICD-10-CM | POA: Diagnosis not present

## 2019-07-05 DIAGNOSIS — A419 Sepsis, unspecified organism: Secondary | ICD-10-CM | POA: Diagnosis not present

## 2019-07-05 DIAGNOSIS — B370 Candidal stomatitis: Secondary | ICD-10-CM

## 2019-07-05 LAB — GLUCOSE, CAPILLARY
Glucose-Capillary: 133 mg/dL — ABNORMAL HIGH (ref 70–99)
Glucose-Capillary: 110 mg/dL — ABNORMAL HIGH (ref 70–99)

## 2019-07-05 MED ORDER — PREDNISONE 5 MG PO TABS: ORAL_TABLET | ORAL | 0 refills | Status: DC

## 2019-07-05 MED ORDER — LEVALBUTEROL HCL 0.63 MG/3ML IN NEBU
0.6300 mg | INHALATION_SOLUTION | Freq: Three times a day (TID) | RESPIRATORY_TRACT | Status: DC
  Administered 2019-07-05 (×2): 0.63 mg via RESPIRATORY_TRACT
  Filled 2019-07-05 (×2): qty 3

## 2019-07-05 MED ORDER — LINAGLIPTIN 5 MG PO TABS: 5.0000 mg | ORAL_TABLET | Freq: Every day | ORAL | 0 refills | Status: DC

## 2019-07-05 NOTE — Plan of Care (Signed)

## 2019-07-05 NOTE — Progress Notes (Signed)
Progress Note  Patient Name: Philip FADELY Sr. Date of Encounter: 07/05/2019  Primary Cardiologist: Fletcher Anon  Subjective   Net out 5.8 L.  Creatinine has continued to improve with diuresis.  Remains in atrial fibrillation with improved heart rates.  This Asheton Viramontes likely continue to improve with continued diuresis.  He is overall feeling well.  He continues to be mildly short of breath.  Inpatient Medications    Scheduled Meds: . apixaban  5 mg Oral BID  . atorvastatin  40 mg Oral Daily  . budesonide (PULMICORT) nebulizer solution  0.5 mg Nebulization BID  . Chlorhexidine Gluconate Cloth  6 each Topical Daily  . diphenhydrAMINE  50 mg Oral Once   Or  . diphenhydrAMINE  50 mg Intravenous Once  . famotidine  20 mg Oral BID  . gabapentin  100 mg Oral TID  . guaiFENesin  600 mg Oral BID  . insulin aspart  0-20 Units Subcutaneous TID WC  . insulin aspart  0-5 Units Subcutaneous QHS  . insulin aspart  6 Units Subcutaneous TID WC  . insulin glargine  10 Units Subcutaneous QHS  . levalbuterol  0.63 mg Nebulization TID  . levothyroxine  88 mcg Oral Daily  . losartan  25 mg Oral Daily  . metoprolol tartrate  75 mg Oral BID  . montelukast  10 mg Oral QHS  . nystatin  5 mL Oral QID  . Ensure Max Protein  11 oz Oral BID  . sodium chloride flush  10-40 mL Intracatheter Q12H   Continuous Infusions: . sodium chloride Stopped (07/02/19 0658)  . sodium chloride 250 mL (07/05/19 0428)  .  ceFAZolin (ANCEF) IV 2 g (07/05/19 0429)   PRN Meds: sodium chloride, guaiFENesin-dextromethorphan, ipratropium, phenol   Vital Signs    Vitals:   07/04/19 1947 07/04/19 2013 07/05/19 0320 07/05/19 0757  BP:  108/75 (!) 141/78 128/78  Pulse:  97 88 99  Resp:  18 18 18   Temp:  98 F (36.7 C) 98.8 F (37.1 C) 98.4 F (36.9 C)  TempSrc:  Oral Oral   SpO2: 96% 92% 91% 92%  Weight:   94.6 kg   Height:        Intake/Output Summary (Last 24 hours) at 07/05/2019 1252 Last data filed at 07/05/2019  0321 Gross per 24 hour  Intake 1116.85 ml  Output 3200 ml  Net -2083.15 ml   Filed Weights   07/04/19 0102 07/04/19 0858 07/05/19 0320  Weight: 95.6 kg 95.6 kg 94.6 kg    Telemetry    Atrial fibrillation, rates 80s to 90s- Personally Reviewed  ECG    No new tracings - Personally Reviewed  Physical Exam   GEN: Well nourished, well developed, in no acute distress  HEENT: normal  Neck: no JVD, carotid bruits, or masses Cardiac: Irregular; no murmurs, rubs, or gallops,no edema  Respiratory: Rhonchi throughout GI: soft, nontender, nondistended, + BS MS: no deformity or atrophy  Skin: warm and dry Neuro:  Strength and sensation are intact Psych: euthymic mood, full affect   Labs    Chemistry Recent Labs  Lab 07/02/19 0602 07/03/19 0529 07/04/19 0522  NA 138 139 138  K 3.7 4.2 4.0  CL 102 105 106  CO2 25 25 27   GLUCOSE 148* 182* 154*  BUN 22 27* 30*  CREATININE 1.05 0.92 0.89  CALCIUM 8.5* 8.3* 8.1*  GFRNONAA >60 >60 >60  GFRAA >60 >60 >60  ANIONGAP 11 9 5      Hematology Recent  Labs  Lab 07/01/19 0537 07/02/19 0602 07/03/19 0529  WBC 12.6* 10.7* 11.1*  RBC 4.66 4.58 4.20*  HGB 14.2 13.9 12.7*  HCT 41.0 40.7 37.6*  MCV 88.0 88.9 89.5  MCH 30.5 30.3 30.2  MCHC 34.6 34.2 33.8  RDW 13.4 13.2 13.1  PLT 223 241 225    Cardiac EnzymesNo results for input(s): TROPONINI in the last 168 hours. No results for input(s): TROPIPOC in the last 168 hours.   BNP Recent Labs  Lab 06/30/19 0448  BNP 857.0*     DDimer No results for input(s): DDIMER in the last 168 hours.   Radiology    DG Chest Port 1 View  Result Date: 07/02/2019 IMPRESSION: 1. Right-sided PICC line with tip terminating over the SVC. 2. Left basilar airspace opacity favored to represent atelectasis with an infiltrate not excluded. Electronically Signed   By: Constance Holster M.D.   On: 07/02/2019 15:22    Cardiac Studies   2D echo 06/29/2019: 1. Left ventricular ejection fraction, by  estimation, is 40 to 45%. The left ventricle has mildly decreased function. The left ventricle demonstrates global hypokinesis. The left ventricular internal cavity size was moderately dilated. Left ventricular diastolic parameters are indeterminate.  2. Right ventricular systolic function is mildly reduced. The right ventricular size is mildly enlarged. There is moderately elevated pulmonary artery systolic pressure.  3. Left atrial size was mildly dilated.  4. Right atrial size was mildly dilated.  5. The mitral valve is normal in structure and function. Mild to moderate  mitral valve regurgitation. No evidence of mitral stenosis.  6. Tricuspid valve regurgitation is mild to moderate.  7. No valve vegetation noted.   Patient Profile     74 y.o. male with history of CAD status post three-vessel CABG in 05/2012, PAF, ectopic atrial tachycardia, HFrEF, DM2, HTN, HLD, and COPD who we are seeing for A. fib and TEE evaluation in the setting of MSSA bacteremia.  Assessment & Plan    1. PAF with RVR: Currently on metoprolol.  Ventricular rates have been better controlled.  CHA2DS2-VASc of 5 on Eliquis.  He may require rhythm control, but this would be more appropriate once infectious issues have resolved.  2.  MSSA bacteremia with left lower lobe pneumonia and COPD exacerbation: No evidence of endocarditis on TEE.  Antibiotics per primary team.  3.  CAD status post CABG without angina: No current symptoms of angina.  Currently on Eliquis and aspirin.  Would continue Lipitor.  Tamisha Nordstrom potentially need outpatient ischemic evaluation.  4.  HFrEF: Echo with an ejection fraction of 40 to 45%.  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  He Brynja Marker potentially undergo outpatient ischemic evaluation.  He Gunda Maqueda also potentially need rhythm control for his atrial fibrillation.  Blood pressure is improved.  Mylinda Brook likely need goal-directed medical therapy titrated as an outpatient.  5.   HTN: Currently well controlled  6.  HLD: Continue Lipitor  For questions or updates, please contact [facility] Please consult [email or web address] for contact info under Cardiology/STEMI.    Signed, Christell Faith, [name] [place] Pager: [phone] 07/05/2019, 12:52 PM

## 2019-07-05 NOTE — Discharge Summary (Signed)
[facility] [place] NAME: Philip [name]    MR#:  [id number]  Guadalupe Guerra:  [birth year]  DATE OF ADMISSION:  06/26/2019 ADMITTING PHYSICIAN: Christel Mormon, MD  DATE OF DISCHARGE: 07/05/2019  PRIMARY CARE PHYSICIAN: [name], DO    ADMISSION DIAGNOSIS:  Shock ([place]) [R57.9] COPD exacerbation ([place]) [J44.1] Chronic obstructive pulmonary disease, unspecified COPD type ([place]) [J44.9] Community acquired pneumonia of left lower lobe of lung [J18.9] Sepsis, due to unspecified organism, unspecified whether acute organ dysfunction present ([place]) [A41.9]  DISCHARGE DIAGNOSIS:  Active Problems:   Atrial fibrillation with rapid ventricular response ([place])   Type 2 diabetes mellitus with hyperlipidemia ([place])   COPD with acute exacerbation ([place])   Shock ([place])   Community acquired pneumonia of left lower lobe of lung   Severe sepsis with septic shock (HCC)   HFrEF (heart failure with reduced ejection fraction) (HCC)   Acute respiratory failure with hypoxia (HCC)   AKI (acute kidney injury) ([place])   MSSA bacteremia   Chronic systolic CHF (congestive heart failure) (HCC)   AF (paroxysmal atrial fibrillation) ([place])   SECONDARY DIAGNOSIS:   Past Medical History:  Diagnosis Date  . Arthritis    a. right knee  . Asthma   . Atrial tachycardia ([place])   . 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 etiology.  [name] COPD (chronic obstructive pulmonary disease) ([place])   . Coronary artery disease    a. 05/2012 Cath: severe 3VD-->CABG x 4 by Dr. Servando Snare in 1/2014[place] Jun, SVG->LCX & SVG->RPDA; b. 06/2014 MV: no ischemia/infarct.  . Diastolic dysfunction    a. 06/2015 Echo: EF 50-55%, Gr1 DD. Mild MR. Mildly dil LA. Nl RV fxn. Nl PASP.  [name] Edema    LEGS/ FEET  . Essential hypertension   . History of bronchitis   . HOH (hard of hearing)   . Hypercholesteremia   . Neuropathy   . PAF (paroxysmal atrial fibrillation) (HCC)    a. CHA2DS2VASc =  4-->Eliquis.  . Shingles   . Sleep apnea    NO CPAP  . Type II diabetes mellitus ([place])     HOSPITAL COURSE:   1.  Septic shock, present on admission.  With MSSA bacteremia.  Repeat blood cultures are negative.  Patient has a PICC line.  Patient currently on IV Ancef.  TEE negative for vegetations.  Patient will receive home IV Ancef through 3/14 as per infectious disease a total of 2 weeks.  Patient will be discharged home today after 2 PM dose of Ancef.  Family will give the 10 PM dose tonight and home health will be out on Sunday. 2.  Acute hypoxic respiratory failure secondary to MSSA pneumonia.,  COPD exacerbation.  The patient is on chronic steroids he will be tapered down on his steroids to 5 mg daily.  Patient has inhalers and nebulizers at home.  Patient tapered off oxygen here in the hospital. 3.  Chronic systolic congestive heart failure.  Patient on oral Lasix, losartan and metoprolol. 4.  Paroxysmal atrial fibrillation but here in the hospital mostly in atrial fibrillation.  Continue metoprolol and Eliquis for anticoagulation 5.  Acute kidney injury.  This has improved with IV fluids.  GFR now greater than 60 6.  Type 2 diabetes mellitus with hyperlipidemia.  Here in the hospital the patient was on glargine insulin.  The patient wants to go back to pills at home.  I will prescribe Tradjenta 5  mg daily and he can go back on his Metformin.  Patient already on atorvastatin. 7.  Thrush.  This has improved.  Continue nystatin swish and swallow  DISCHARGE CONDITIONS:   Satisfactory  CONSULTS OBTAINED:  Treatment Team:  [name], MD  DRUG ALLERGIES:   Allergies  Allergen Reactions  . Bydureon [Exenatide] Anaphylaxis    Possible from bydureon, not 100% confirmed  . Clindamycin/Lincomycin Hives and Itching  . Penicillins Hives, Itching, Rash and Other (See Comments)    Has patient had a PCN reaction causing immediate rash, facial/tongue/throat swelling, SOB or  lightheadedness with hypotension: Yes Has patient had a PCN reaction causing severe rash involving mucus membranes or skin necrosis: No Has patient had a PCN reaction that required hospitalization: No Has patient had a PCN reaction occurring within the last 10 years: No If all of the above answers are "NO", then may proceed with Cephalosporin use.  . Sulfa Antibiotics Shortness Of Breath  . Spiriferis Other (See Comments)    Affected breathing  . Spiriva Handihaler [Tiotropium Bromide Monohydrate]     Affected breathing  . Ivp Dye [Iodinated Diagnostic Agents] Rash and Other (See Comments)    Redness    DISCHARGE MEDICATIONS:   Allergies as of 07/05/2019      Reactions   Bydureon [exenatide] Anaphylaxis   Possible from bydureon, not 100% confirmed   Clindamycin/lincomycin Hives, Itching   Penicillins Hives, Itching, Rash, Other (See Comments)   Has patient had a PCN reaction causing immediate rash, facial/tongue/throat swelling, SOB or lightheadedness with hypotension: Yes Has patient had a PCN reaction causing severe rash involving mucus membranes or skin necrosis: No Has patient had a PCN reaction that required hospitalization: No Has patient had a PCN reaction occurring within the last 10 years: No If all of the above answers are "NO", then may proceed with Cephalosporin use.   Sulfa Antibiotics Shortness Of Breath   Spiriferis Other (See Comments)   Affected breathing   Spiriva Handihaler [tiotropium Bromide Monohydrate]    Affected breathing   Ivp Dye [iodinated Diagnostic Agents] Rash, Other (See Comments)   Redness      Medication List    TAKE these medications   Albuterol Sulfate 108 (90 Base) MCG/ACT Aepb Inhale 2 puffs into the lungs every 6 (six) hours as needed (shortness of breath). What changed: Another medication with the same name was removed. Continue taking this medication, and follow the directions you see here.   atorvastatin 40 MG tablet Commonly known  as: LIPITOR Take 1 tablet by mouth once daily   ceFAZolin  IVPB Commonly known as: ANCEF Inject 2 g into the vein every 8 (eight) hours for 9 days. Indication: MSSA bacteremia Last Day of Therapy:  07/13/2019 Labs - Once weekly:  CBC/D, CRP and CMP   cetirizine 10 MG tablet Commonly known as: ZYRTEC Take 10 mg by mouth daily.   diclofenac sodium 1 % Gel Commonly known as: VOLTAREN Apply 2 g topically 3 (three) times daily as needed (Use on elbow bursitis and knee).   DRY EYES OP Place 1 drop into both eyes daily as needed (for dry eyes).   Eliquis 5 MG Tabs tablet Generic drug: apixaban Take 1 tablet by mouth twice daily   Ensure Max Protein Liqd Take 330 mLs (11 oz total) by mouth 2 (two) times daily.   fluticasone 50 MCG/ACT nasal spray Commonly known as: FLONASE Place 1 spray into both nostrils daily as needed for allergies or rhinitis.  furosemide 40 MG tablet Commonly known as: LASIX TAKE 1 TABLET BY MOUTH ONCE DAILY. APPOINTMENT NEEDED FOR FURTHER REFILLS. What changed: See the new instructions.   gabapentin 100 MG capsule Commonly known as: NEURONTIN TAKE 1 CAPSULE BY MOUTH THREE TIMES DAILY   guaiFENesin 600 MG 12 hr tablet Commonly known as: MUCINEX Take 600 mg by mouth 2 (two) times daily as needed.   ipratropium-albuterol 0.5-2.5 (3) MG/3ML Soln Commonly known as: DUONEB Take 3 mLs by nebulization 4 (four) times daily.   Lancets Ultra Thin Misc 1 Device by Does not apply route 4 (four) times daily -  before meals and at bedtime.   levothyroxine 88 MCG tablet Commonly known as: SYNTHROID Take 1 tablet by mouth once daily   linagliptin 5 MG Tabs tablet Commonly known as: TRADJENTA Take 1 tablet (5 mg total) by mouth daily.   losartan 25 MG tablet Commonly known as: COZAAR Take 1 tablet by mouth once daily   metFORMIN 500 MG tablet Commonly known as: GLUCOPHAGE TAKE 2 TABLETS BY MOUTH TWICE DAILY WITH A MEAL What changed: See the new  instructions.   Metoprolol Tartrate 75 MG Tabs Take 75 mg by mouth 2 (two) times daily. What changed:   medication strength  See the new instructions.   montelukast 10 MG tablet Commonly known as: SINGULAIR Take 10 mg by mouth at bedtime.   nystatin 100000 UNIT/ML suspension Commonly known as: MYCOSTATIN Take 5 mLs (500,000 Units total) by mouth 4 (four) times daily.   ONE TOUCH ULTRA 2 w/Device Kit Use to check blood sugar as advised up to twice a day   OneTouch Ultra test strip Generic drug: glucose blood USE TEST STRIP(S) TO CHECK GLUCOSE TWICE DAILY   predniSONE 5 MG tablet Commonly known as: DELTASONE 4 tabs po day1; 3 tabs po day2; 2 tabs po day3,4; 1 tab po day afterwards What changed:   how much to take  how to take this  when to take this  additional instructions   sodium chloride flush 0.9 % Soln Commonly known as: NS 20 mLs by Intracatheter route every 8 (eight) hours.   Symbicort 160-4.5 MCG/ACT inhaler Generic drug: budesonide-formoterol Inhale 2 puffs 2 (two) times daily into the lungs.            Home Infusion Instuctions  (From admission, onward)         Start     Ordered   07/04/19 0000  Home infusion instructions    Question:  Instructions  Answer:  Flushing of vascular access device: 0.9% NaCl pre/post medication administration and prn patency; Heparin 100 u/ml, 71m for implanted ports and Heparin 10u/ml, 555mfor all other central venous catheters.   07/04/19 1337           DISCHARGE INSTRUCTIONS:   Follow-up PMD 5 days Follow-up infectious disease next week Follow-up cardiology as outpatient  If you experience worsening of your admission symptoms, develop shortness of breath, life threatening emergency, suicidal or homicidal thoughts you must seek medical attention immediately by calling 911 or calling your MD immediately  if symptoms less severe.  You Must read complete instructions/literature along with all the possible  adverse reactions/side effects for all the Medicines you take and that have been prescribed to you. Take any new Medicines after you have completely understood and accept all the possible adverse reactions/side effects.   Please note  You were cared for by a hospitalist during your hospital stay. If you have any questions about  your discharge medications or the care you received while you were in the hospital after you are discharged, you can call the unit and asked to speak with the hospitalist on call if the hospitalist that took care of you is not available. Once you are discharged, your primary care physician will handle any further medical issues. Please note that NO REFILLS for any discharge medications will be authorized once you are discharged, as it is imperative that you return to your primary care physician (or establish a relationship with a primary care physician if you do not have one) for your aftercare needs so that they can reassess your need for medications and monitor your lab values.    Today   CHIEF COMPLAINT:   Chief Complaint  Patient presents with  . Shortness of Breath    HISTORY OF PRESENT ILLNESS:  Philip [name]  is a 74 y.o. male came in with shortness of breath   VITAL SIGNS:  Blood pressure 128/78, pulse 99, temperature 98.4 F (36.9 C), resp. rate 18, height 5' 11"  (1.803 m), weight 94.6 kg, SpO2 92 %.   PHYSICAL EXAMINATION:  GENERAL:  74 y.o.-year-old patient lying in the bed with no acute distress.  EYES: Pupils equal, round, reactive to light and accommodation. No scleral icterus. HEENT: Head atraumatic, normocephalic. Oropharynx and nasopharynx clear.  NECK:  Supple, no jugular venous distention. No thyroid enlargement, no tenderness.  LUNGS:  breath sounds bilateral, no wheezing, rales,rhonchi or crepitation. No use of accessory muscles of respiration.  CARDIOVASCULAR: S1, S2 irregular irregular. No murmurs, rubs, or gallops.  ABDOMEN: Soft,  non-tender.  EXTREMITIES: No pedal edema.  NEUROLOGIC: Cranial nerves II through XII are intact. Muscle strength 5/5 in all extremities. Sensation intact. Gait not checked.  PSYCHIATRIC: The patient is alert and oriented x 3.  SKIN: No obvious rash, lesion, or ulcer.   DATA REVIEW:   CBC Recent Labs  Lab 07/03/19 0529  WBC 11.1*  HGB 12.7*  HCT 37.6*  PLT 225    Chemistries  Recent Labs  Lab 06/30/19 0448 07/01/19 0537 07/04/19 0522  NA 141   < > 138  K 4.2   < > 4.0  CL 108   < > 106  CO2 26   < > 27  GLUCOSE 137*   < > 154*  BUN 27*   < > 30*  CREATININE 0.92   < > 0.89  CALCIUM 8.4*   < > 8.1*  MG 2.3  --   --    < > = values in this interval not displayed.    Cardiac Enzymes No results for input(s): TROPONINI in the last 168 hours.  Microbiology Results  Results for orders placed or performed during the hospital encounter of 06/26/19  Blood Culture (routine x 2)     Status: None   Collection Time: 06/26/19  9:45 PM   Specimen: BLOOD  Result Value Ref Range Status   Specimen Description BLOOD LEFT HAND  Final   Special Requests   Final    BOTTLES DRAWN AEROBIC AND ANAEROBIC Blood Culture adequate volume   Culture   Final    NO GROWTH 5 DAYS Performed at [facility], [address] [place], [place] [address]    Report Status 07/01/2019 FINAL  Final  Blood Culture (routine x 2)     Status: Abnormal   Collection Time: 06/26/19  9:58 PM   Specimen: BLOOD  Result Value Ref Range Status   Specimen Description  Final    BLOOD BLOOD RIGHT HAND Performed at [facility], [address] [place], [place] [address]    Special Requests   Final    BOTTLES DRAWN AEROBIC AND ANAEROBIC Blood Culture adequate volume Performed at [facility], [address] [place], [place] [address]    Culture  Setup Time   Final    GRAM POSITIVE COCCI AEROBIC BOTTLE ONLY CRITICAL RESULT CALLED TO, READ BACK BY AND VERIFIED WITH: KEYSHAWN PATEL  06/27/19 @ 1400  [place] Performed at [facility], [address] [place], [place] [address]    Culture STAPHYLOCOCCUS AUREUS (A)  Final   Report Status 06/29/2019 FINAL  Final   Organism ID, Bacteria STAPHYLOCOCCUS AUREUS  Final      Susceptibility   Staphylococcus aureus - MIC*    CIPROFLOXACIN >=8 RESISTANT Resistant     ERYTHROMYCIN 0.5 SENSITIVE Sensitive     GENTAMICIN <=0.5 SENSITIVE Sensitive     OXACILLIN 0.5 SENSITIVE Sensitive     TETRACYCLINE <=1 SENSITIVE Sensitive     VANCOMYCIN <=0.5 SENSITIVE Sensitive     TRIMETH/SULFA <=10 SENSITIVE Sensitive     CLINDAMYCIN <=0.25 SENSITIVE Sensitive     RIFAMPIN <=0.5 SENSITIVE Sensitive     Inducible Clindamycin NEGATIVE Sensitive     * STAPHYLOCOCCUS AUREUS  Respiratory Panel by RT PCR (Flu A&B, Covid) - Nasopharyngeal Swab     Status: None   Collection Time: 06/26/19 10:49 PM   Specimen: Nasopharyngeal Swab  Result Value Ref Range Status   SARS Coronavirus 2 by RT PCR NEGATIVE NEGATIVE Final    Comment: (NOTE) SARS-CoV-2 target nucleic acids are NOT DETECTED. The SARS-CoV-2 RNA is generally detectable in upper respiratoy specimens during the acute phase of infection. The lowest concentration of SARS-CoV-2 viral copies this assay can detect is 131 copies/mL. A negative result does not preclude SARS-Cov-2 infection and should not be used as the sole basis for treatment or other patient management decisions. A negative result may occur with  improper specimen collection/handling, submission of specimen other than nasopharyngeal swab, presence of viral mutation(s) within the areas targeted by this assay, and inadequate number of viral copies (<131 copies/mL). A negative result must be combined with clinical observations, patient history, and epidemiological information. The expected result is Negative. Fact Sheet for Patients:  PinkCheek.be Fact Sheet for Healthcare Providers:   GravelBags.it This test is not yet ap proved or cleared by the [place] FDA and  has been authorized for detection and/or diagnosis of SARS-CoV-2 by FDA under an Emergency Use Authorization (EUA). This EUA will remain  in effect (meaning this test can be used) for the duration of the COVID-19 declaration under Section 564(b)(1) of the Act, 21 U.S.C. section 360bbb-3(b)(1), unless the authorization is terminated or revoked sooner.    Influenza A by PCR NEGATIVE NEGATIVE Final   Influenza B by PCR NEGATIVE NEGATIVE Final    Comment: (NOTE) The Xpert Xpress SARS-CoV-2/FLU/RSV assay is intended as an aid in  the diagnosis of influenza from Nasopharyngeal swab specimens and  should not be used as a sole basis for treatment. Nasal washings and  aspirates are unacceptable for Xpert Xpress SARS-CoV-2/FLU/RSV  testing. Fact Sheet for Patients: PinkCheek.be Fact Sheet for Healthcare Providers: GravelBags.it This test is not yet approved or cleared by the [place] FDA and  has been authorized for detection and/or diagnosis of SARS-CoV-2 by  FDA under an Emergency Use Authorization (EUA). This EUA will remain  in effect (meaning this  test can be used) for the duration of the  Covid-19 declaration under Section 564(b)(1) of the Act, 21  U.S.C. section 360bbb-3(b)(1), unless the authorization is  terminated or revoked. Performed at [facility], [address] [place], [place] [address]   Urine culture     Status: None   Collection Time: 06/27/19  1:30 AM   Specimen: In/Out Cath Urine  Result Value Ref Range Status   Specimen Description   Final    IN/OUT CATH URINE Performed at [facility], [address] [place], [place] [address]    Special Requests   Final    NONE Performed at [facility], [address], [place], [place] [address]    Culture   Final     NO GROWTH Performed at [facility], [place] [address] [place], [place] [address]    Report Status 06/28/2019 FINAL  Final  Culture, sputum-assessment     Status: None   Collection Time: 06/27/19  1:30 AM   Specimen: Expectorated Sputum  Result Value Ref Range Status   Specimen Description EXPECTORATED SPUTUM  Final   Special Requests NONE  Final   Sputum evaluation   Final    THIS SPECIMEN IS ACCEPTABLE FOR SPUTUM CULTURE Performed at [facility], [address] [place], [place] [address]    Report Status 06/27/2019 FINAL  Final  MRSA PCR Screening     Status: None   Collection Time: 06/27/19  1:30 AM   Specimen: Nasal Mucosa; Nasopharyngeal  Result Value Ref Range Status   MRSA by PCR NEGATIVE NEGATIVE Final    Comment:        The GeneXpert MRSA Assay (FDA approved for NASAL specimens only), is one component of a comprehensive MRSA colonization surveillance program. It is not intended to diagnose MRSA infection nor to guide or monitor treatment for MRSA infections. Performed at [facility], [address] [place], [place] [address]   Culture, respiratory     Status: None   Collection Time: 06/27/19  1:30 AM  Result Value Ref Range Status   Specimen Description   Final    EXPECTORATED SPUTUM Performed at [facility], [address] [place], [place] [address]    Special Requests   Final    NONE Reflexed from [id number] Performed at [facility], [place], [place] [address]    Gram Stain   Final    ABUNDANT WBC PRESENT,BOTH PMN AND MONONUCLEAR MODERATE GRAM POSITIVE COCCI FEW GRAM POSITIVE RODS RARE GRAM NEGATIVE RODS RARE YEAST MODERATE SQUAMOUS EPITHELIAL CELLS PRESENT Performed at [facility], [place] [address], [place],  [address]    Culture FEW STAPHYLOCOCCUS AUREUS FEW CANDIDA ALBICANS   Final   Report Status 07/03/2019 FINAL  Final   Organism ID, Bacteria STAPHYLOCOCCUS AUREUS  Final       Susceptibility   Staphylococcus aureus - MIC*    CIPROFLOXACIN >=8 RESISTANT Resistant     ERYTHROMYCIN 0.5 SENSITIVE Sensitive     GENTAMICIN <=0.5 SENSITIVE Sensitive     OXACILLIN 0.5 SENSITIVE Sensitive     TETRACYCLINE <=1 SENSITIVE Sensitive     VANCOMYCIN <=0.5 SENSITIVE Sensitive     TRIMETH/SULFA <=10 SENSITIVE Sensitive     CLINDAMYCIN <=0.25 SENSITIVE Sensitive     RIFAMPIN <=0.5 SENSITIVE Sensitive     Inducible Clindamycin NEGATIVE Sensitive     * FEW STAPHYLOCOCCUS AUREUS  Group A Strep by PCR  Status: None   Collection Time: 06/27/19  2:11 AM   Specimen: Throat; Sterile Swab  Result Value Ref Range Status   Group A Strep by PCR NOT DETECTED NOT DETECTED Final    Comment: Performed at [facility], [address] [place], [place] [address]  Culture, blood (single) w Reflex to ID Panel     Status: None   Collection Time: 06/29/19  1:11 PM   Specimen: BLOOD  Result Value Ref Range Status   Specimen Description BLOOD RAC  Final   Special Requests   Final    BOTTLES DRAWN AEROBIC AND ANAEROBIC Blood Culture results may not be optimal due to an excessive volume of blood received in culture bottles   Culture   Final    NO GROWTH 5 DAYS Performed at [facility], [address] [place], [place] [address]    Report Status 07/04/2019 FINAL  Final    RADIOLOGY:  ECHO TEE  Result Date: 07/04/2019    TRANSESOPHOGEAL ECHO REPORT   Patient Name:   Philip CISLO Sr. Date of Exam: 07/04/2019 Medical Rec #:  [id number]          Height:       71.0 in Accession #:    [id number]         Weight:       210.8 lb Date of Birth:  1945-05-20         BSA:          2.156 m Patient Age:    3 years           BP:           120/69 mmHg Patient Gender: M                  HR:           114 bpm. Exam Location:  [facility] Procedure: Color Doppler, Cardiac Doppler, Transesophageal Echo and 3D Echo Indications:     R78.81 Bacteremia  History:         Patient has prior history of  Echocardiogram examinations.                  Pneumonia.  Sonographer:     [place] (AE) Referring Phys:  [id number] Rise [place] Diagnosing Phys: Nelva Bush MD PROCEDURE: After discussion of the risks and benefits of a TEE, an informed consent was obtained from the patient. The transesophogeal probe was passed without difficulty through the esophogus of the patient. Local oropharyngeal anesthetic was provided with viscous lidocaine. Sedation performed by performing physician. Image quality was good. The patient's vital signs; including heart rate, blood pressure, and oxygen saturation; remained stable throughout the procedure. The patient developed no complications during the procedure. IMPRESSIONS  1. Left ventricular ejection fraction, by estimation, is 30 to 35%. The left ventricle has moderately decreased function. The left ventricle demonstrates global hypokinesis.  2. Right ventricular systolic function is mildly reduced. The right ventricular size is mildly enlarged.  3. No left atrial/left atrial appendage thrombus was detected.  4. The mitral valve is degenerative. Mild to moderate mitral valve regurgitation. No evidence of mitral stenosis.  5. The aortic valve is tricuspid. Aortic valve regurgitation is not visualized. Mild aortic valve sclerosis is present, with no evidence of aortic valve stenosis.  6. There is mild (Grade II) plaque involving the descending aorta. Conclusion(s)/Recommendation(s): No evidence of vegetation/infective endocarditis on this transesophageal echocardiogram. FINDINGS  Left Ventricle: Left ventricular  ejection fraction, by estimation, is 30 to 35%. The left ventricle has moderately decreased function. The left ventricle demonstrates global hypokinesis. The left ventricular internal cavity size was normal in size. There is no left ventricular hypertrophy. Right Ventricle: The right ventricular size is mildly enlarged. Right vetricular wall thickness was not assessed.  Right ventricular systolic function is mildly reduced. Left Atrium: Left atrial size was not assessed. No left atrial/left atrial appendage thrombus was detected. Right Atrium: Right atrial size was not assessed. Pericardium: There is no evidence of pericardial effusion. Mitral Valve: The mitral valve is degenerative in appearance. Mild to moderate mitral valve regurgitation. No evidence of mitral valve stenosis. MV peak gradient, 86.5 mmHg. The mean mitral valve gradient is 44.0 mmHg. There is no evidence of mitral valve vegetation. Tricuspid Valve: The tricuspid valve is normal in structure. Tricuspid valve regurgitation is mild. There is no evidence of tricuspid valve vegetation. Aortic Valve: The aortic valve is tricuspid. . There is mild thickening of the aortic valve. Aortic valve regurgitation is not visualized. Mild aortic valve sclerosis is present, with no evidence of aortic valve stenosis. There is mild thickening of the aortic valve. There is no evidence of aortic valve vegetation. Pulmonic Valve: The pulmonic valve was normal in structure. Pulmonic valve regurgitation is trivial. There is no evidence of pulmonic valve vegetation. Aorta: The aortic root and ascending aorta are structurally normal, with no evidence of dilitation. There is mild (Grade II) plaque involving the descending aorta. IAS/Shunts: No atrial level shunt detected by color flow Doppler.  MITRAL VALVE MV Peak grad: 86.5 mmHg MV Mean grad: 44.0 mmHg MV Vmax:      4.65 m/s MV Vmean:     281.0 cm/s MR PISA:        1.24 cm MR PISA Radius: 0.44 cm Nelva Bush MD Electronically signed by Nelva Bush MD Signature Date/Time: 07/04/2019/10:37:35 AM    Final     EKG:   Orders placed or performed during the hospital encounter of 06/26/19  . ED EKG 12-Lead  . ED EKG 12-Lead  . EKG 12-Lead  . EKG 12-Lead      Management plans discussed with the patient, family and they are in agreement.  CODE STATUS:     Code Status Orders   (From admission, onward)         Start     Ordered   06/26/19 2335  Full code  Continuous     06/26/19 2340        Code Status History    Date Active Date Inactive Code Status Order ID Comments User Context   07/23/2015 2159 07/29/2015 1654 Partial Code [id number]  Bettey Costa, MD Inpatient   05/10/2012 0733 05/13/2012 1427 Full Code [id number]  [name], MD Inpatient   05/07/2012 1322 05/10/2012 0733 Full Code [id number]  Lucy Chris, RN Inpatient   [facility] Care Planning Activity      TOTAL TIME TAKING CARE OF THIS PATIENT: 35 minutes.    Loletha Grayer M.D on 07/05/2019 at 3:04 PM  Between 7am to 6pm - Pager - [phone]  After 6pm go to [email or web address] - password [email or web address]  [facility]  CC: Primary care physician; [name], DO

## 2019-07-05 NOTE — TOC Progression Note (Addendum)
Transition of Care (TOC) - Progression Note    Patient Details  Name: Philip CREDIT Sr. MRN: [id number] Date of Birth: 1945/09/06  Transition [facility]) CM/SW Contact  Truitt Merle, LCSW Phone Number: 07/05/2019, 3:16 PM  Clinical Narrative:    Patient medically ready for discharge today. Home ibx already arranged with Carolynn Sayers through [facility]. Updated Corene Cornea with [place]. Patient with transportation and no other needs. TOC signing off.         Expected Discharge Plan and Services           Expected Discharge Date: 07/05/19                                     Social Determinants of Health (SDOH) Interventions    Readmission Risk Interventions No flowsheet data found.

## 2019-07-06 DIAGNOSIS — E785 Hyperlipidemia, unspecified: Secondary | ICD-10-CM | POA: Diagnosis not present

## 2019-07-06 DIAGNOSIS — Z452 Encounter for adjustment and management of vascular access device: Secondary | ICD-10-CM | POA: Diagnosis not present

## 2019-07-06 DIAGNOSIS — E1159 Type 2 diabetes mellitus with other circulatory complications: Secondary | ICD-10-CM | POA: Diagnosis not present

## 2019-07-06 DIAGNOSIS — I480 Paroxysmal atrial fibrillation: Secondary | ICD-10-CM | POA: Diagnosis not present

## 2019-07-06 DIAGNOSIS — J440 Chronic obstructive pulmonary disease with acute lower respiratory infection: Secondary | ICD-10-CM | POA: Diagnosis not present

## 2019-07-06 DIAGNOSIS — N179 Acute kidney failure, unspecified: Secondary | ICD-10-CM | POA: Diagnosis not present

## 2019-07-06 DIAGNOSIS — A419 Sepsis, unspecified organism: Secondary | ICD-10-CM | POA: Diagnosis not present

## 2019-07-06 DIAGNOSIS — I110 Hypertensive heart disease with heart failure: Secondary | ICD-10-CM | POA: Diagnosis not present

## 2019-07-06 DIAGNOSIS — J9601 Acute respiratory failure with hypoxia: Secondary | ICD-10-CM | POA: Diagnosis not present

## 2019-07-06 DIAGNOSIS — I2510 Atherosclerotic heart disease of native coronary artery without angina pectoris: Secondary | ICD-10-CM | POA: Diagnosis not present

## 2019-07-06 DIAGNOSIS — J15211 Pneumonia due to Methicillin susceptible Staphylococcus aureus: Secondary | ICD-10-CM | POA: Diagnosis not present

## 2019-07-06 DIAGNOSIS — I5022 Chronic systolic (congestive) heart failure: Secondary | ICD-10-CM | POA: Diagnosis not present

## 2019-07-06 DIAGNOSIS — J441 Chronic obstructive pulmonary disease with (acute) exacerbation: Secondary | ICD-10-CM | POA: Diagnosis not present

## 2019-07-07 ENCOUNTER — Telehealth: Payer: Self-pay

## 2019-07-07 ENCOUNTER — Ambulatory Visit: Payer: Self-pay | Admitting: Pharmacist

## 2019-07-07 DIAGNOSIS — J432 Centrilobular emphysema: Secondary | ICD-10-CM

## 2019-07-07 NOTE — Telephone Encounter (Signed)
Transition Care Management Follow-up Telephone Call  Date of discharge and from where: [facility] 07/05/2019  How have you been since you were released from the hospital? "just very weak"  Any questions or concerns? No   Items Reviewed:  Did the pt receive and understand the discharge instructions provided? Yes   Medications obtained and verified? Yes , Benjamine Mola, pharmacist  to call on the 10th to discuss medications with patients wife as he doesn't know them well. appt is already scheduled and pt aware.   Any new allergies since your discharge? No   Dietary orders reviewed? Yes  Do you have support at home? Yes   Functional Questionnaire: (I = Independent and D = Dependent) ADLs: i  Bathing/Dressing- i  Meal Prep- i  Eating- i  Maintaining continence- i  Transferring/Ambulation- d  Managing Meds- d  Follow up appointments reviewed:   PCP Hospital f/u appt confirmed? Yes  Scheduled to see Dr.[name] on 07/11/2019 @ [facility] f/u appt confirmed?n/a  Are transportation arrangements needed? No   If their condition worsens, is the pt aware to call PCP or go to the Emergency Dept.? Yes  Was the patient provided with contact information for the PCP's office or ED? Yes  Was to pt encouraged to call back with questions or concerns? Yes

## 2019-07-07 NOTE — Patient Instructions (Signed)
Thank you allowing the Chronic Care Management Team to be a part of your care! It was a pleasure speaking with you today!     CCM (Chronic Care Management) Team    Noreene Larsson RN, MSN, CCM Nurse Care Coordinator  [phone]   [name] PharmD  Clinical Pharmacist  [phone]   Eula Fried LCSW Clinical Social Worker [phone]  Visit Information  Goals Addressed            This Visit's Progress   . PharmD - Medication Management       CARE PLAN ENTRY (see longitudinal plan of care for additional care plan information)  Current Barriers:  [name] Knowledge deficits related to dietary choices to improve blood sugar control . Financial - currently in coverage gap of Medicare Part D plan  Pharmacist Clinical Goal(s):  [name] Over the next 30 days, patient will work with CM Pharmacist to address needs related to medication regimen optimization.  Interventions: . Perform chart review: Mr. Klein was admitted to [facility] from 2/25 to 3/6 for septic shock, COPD exacerbation and community acquired pneumonia . Speak with Mr. Hurlbutt briefly. o Reports HH RN currently in home.  o Reports currently has all of his medications. . Reports his wife is helping him to manage his medications since discharge and that she is not currently home. . Schedule appointment to call patient and wife back to complete post-discharge medication review.  Patient Self Care Activities:  . Self administers medications as prescribed . Attends all scheduled provider appointments . Calls pharmacy for medication refills . Calls provider office for new concerns or questions  . Patient to check blood sugar regularly as directed and keep log . Patient to keep log when checks blood pressure . Patient will continue work on dietary modifications o Reducing portion sizes o Reducing total carbohydrate intake at each meal o Stopping eating when full, rather than when plate empty  Please see past updates related  to this goal by clicking on the "Past Updates" button in the selected goal         Patient verbalizes understanding of instructions provided today.   Telephone follow up appointment with care management team member scheduled for: 3/10 at 3 pm  [name], PharmD, [place] [phone]

## 2019-07-07 NOTE — Chronic Care Management (AMB) (Signed)
Chronic Care Management   Follow Up Note   07/07/2019 Name: Philip SELMAN Sr. MRN: [id number] DOB: 11/30/1945  Referred by: [name], DO Reason for referral : Chronic Care Management (Patient Phone Call)   Philip Potters Sr. is a 74 y.o. year old male who is a primary care patient of [name], DO. The CCM team was consulted for assistance with chronic disease management and care coordination needs.  Philip [name] has a past medical history including but not limited to type 2 diabetes, CAD, atrial fibrillation, hypothyroidism, OSA, hyperlipidemia, hypertension, CKD, seasonal allergies, knee OA and COPD  Note patient was admitted to [facility] from 2/25 to 3/6 for septic shock, COPD exacerbation and community acquired pneumonia  I reached out to [place]. by phone today.   Review of patient status, including review of consultants reports, relevant laboratory and other test results, and collaboration with appropriate care team members and the patient's provider was performed as part of comprehensive patient evaluation and provision of chronic care management services.     Outpatient Encounter Medications as of 07/07/2019  Medication Sig  . Albuterol Sulfate 108 (90 Base) MCG/ACT AEPB Inhale 2 puffs into the lungs every 6 (six) hours as needed (shortness of breath).   . Artificial Tear Ointment (DRY EYES OP) Place 1 drop into both eyes daily as needed (for dry eyes).  [name] atorvastatin (LIPITOR) 40 MG tablet Take 1 tablet by mouth once daily  . Blood Glucose Monitoring Suppl (ONE TOUCH ULTRA 2) w/Device KIT Use to check blood sugar as advised up to twice a day  . ceFAZolin (ANCEF) IVPB Inject 2 g into the vein every 8 (eight) hours for 9 days. Indication: MSSA bacteremia Last Day of Therapy:  07/13/2019 Labs - Once weekly:  CBC/D, CRP and CMP  . cetirizine (ZYRTEC) 10 MG tablet Take 10 mg by mouth daily.   . diclofenac sodium (VOLTAREN) 1  % GEL Apply 2 g topically 3 (three) times daily as needed (Use on elbow bursitis and knee).  [name] ELIQUIS 5 MG TABS tablet Take 1 tablet by mouth twice daily  . Ensure Max Protein (ENSURE MAX PROTEIN) LIQD Take 330 mLs (11 oz total) by mouth 2 (two) times daily.  . fluticasone (FLONASE) 50 MCG/ACT nasal spray Place 1 spray into both nostrils daily as needed for allergies or rhinitis.  . furosemide (LASIX) 40 MG tablet TAKE 1 TABLET BY MOUTH ONCE DAILY. APPOINTMENT NEEDED FOR FURTHER REFILLS. (Patient taking differently: Every other day)  . gabapentin (NEURONTIN) 100 MG capsule TAKE 1 CAPSULE BY MOUTH THREE TIMES DAILY  . guaiFENesin (MUCINEX) 600 MG 12 hr tablet Take 600 mg by mouth 2 (two) times daily as needed.  [name] ipratropium-albuterol (DUONEB) 0.5-2.5 (3) MG/3ML SOLN Take 3 mLs by nebulization 4 (four) times daily.  [name] LANCETS ULTRA THIN MISC 1 Device by Does not apply route 4 (four) times daily -  before meals and at bedtime.  [name] levothyroxine (SYNTHROID) 88 MCG tablet Take 1 tablet by mouth once daily  . linagliptin (TRADJENTA) 5 MG TABS tablet Take 1 tablet (5 mg total) by mouth daily.  [name] losartan (COZAAR) 25 MG tablet Take 1 tablet by mouth once daily  . metFORMIN (GLUCOPHAGE) 500 MG tablet TAKE 2 TABLETS BY MOUTH TWICE DAILY WITH A MEAL (Patient taking differently: Take 1,000 mg by mouth 2 (two) times daily with a meal. )  . metoprolol tartrate 75 MG TABS Take 75 mg  by mouth 2 (two) times daily.  . montelukast (SINGULAIR) 10 MG tablet Take 10 mg by mouth at bedtime.   [name] nystatin (MYCOSTATIN) 100000 UNIT/ML suspension Take 5 mLs (500,000 Units total) by mouth 4 (four) times daily.  Glory Rosebush ULTRA test strip USE TEST STRIP(S) TO CHECK GLUCOSE TWICE DAILY  . predniSONE (DELTASONE) 5 MG tablet 4 tabs po day1; 3 tabs po day2; 2 tabs po day3,4; 1 tab po day afterwards  . sodium chloride flush (NS) 0.9 % SOLN 20 mLs by Intracatheter route every 8 (eight) hours.  . SYMBICORT 160-4.5 MCG/ACT inhaler  Inhale 2 puffs 2 (two) times daily into the lungs.    No facility-administered encounter medications on file as of 07/07/2019.    Goals Addressed            This Visit's Progress   . PharmD - Medication Management       CARE PLAN ENTRY (see longitudinal plan of care for additional care plan information)  Current Barriers:  [name] Knowledge deficits related to dietary choices to improve blood sugar control . Financial - currently in coverage gap of Medicare Part D plan  Pharmacist Clinical Goal(s):  [name] Over the next 30 days, patient will work with CM Pharmacist to address needs related to medication regimen optimization.  Interventions: . Perform chart review: Philip [name] was admitted to [facility] from 2/25 to 3/6 for septic shock, COPD exacerbation and community acquired pneumonia . Speak with Philip [name] briefly. o Reports HH RN currently in home.  o Reports currently has all of his medications. . Reports his wife is helping him to manage his medications since discharge and that she is not currently home. . Schedule appointment to call patient and wife back to complete post-discharge medication review.  Patient Self Care Activities:  . Self administers medications as prescribed . Attends all scheduled provider appointments . Calls pharmacy for medication refills . Calls provider office for new concerns or questions  . Patient to check blood sugar regularly as directed and keep log . Patient to keep log when checks blood pressure . Patient will continue work on dietary modifications o Reducing portion sizes o Reducing total carbohydrate intake at each meal o Stopping eating when full, rather than when plate empty  Please see past updates related to this goal by clicking on the "Past Updates" button in the selected goal         Plan  Telephone follow up appointment with care management team member scheduled for: 3/10 at 3 pm  [name], PharmD, [place] [phone]

## 2019-07-08 DIAGNOSIS — N179 Acute kidney failure, unspecified: Secondary | ICD-10-CM | POA: Diagnosis not present

## 2019-07-08 DIAGNOSIS — I2510 Atherosclerotic heart disease of native coronary artery without angina pectoris: Secondary | ICD-10-CM | POA: Diagnosis not present

## 2019-07-08 DIAGNOSIS — J15211 Pneumonia due to Methicillin susceptible Staphylococcus aureus: Secondary | ICD-10-CM | POA: Diagnosis not present

## 2019-07-08 DIAGNOSIS — I480 Paroxysmal atrial fibrillation: Secondary | ICD-10-CM | POA: Diagnosis not present

## 2019-07-08 DIAGNOSIS — A419 Sepsis, unspecified organism: Secondary | ICD-10-CM | POA: Diagnosis not present

## 2019-07-08 DIAGNOSIS — J441 Chronic obstructive pulmonary disease with (acute) exacerbation: Secondary | ICD-10-CM | POA: Diagnosis not present

## 2019-07-08 DIAGNOSIS — J440 Chronic obstructive pulmonary disease with acute lower respiratory infection: Secondary | ICD-10-CM | POA: Diagnosis not present

## 2019-07-08 DIAGNOSIS — I110 Hypertensive heart disease with heart failure: Secondary | ICD-10-CM | POA: Diagnosis not present

## 2019-07-08 DIAGNOSIS — E785 Hyperlipidemia, unspecified: Secondary | ICD-10-CM | POA: Diagnosis not present

## 2019-07-08 DIAGNOSIS — Z452 Encounter for adjustment and management of vascular access device: Secondary | ICD-10-CM | POA: Diagnosis not present

## 2019-07-08 DIAGNOSIS — E1159 Type 2 diabetes mellitus with other circulatory complications: Secondary | ICD-10-CM | POA: Diagnosis not present

## 2019-07-08 DIAGNOSIS — I5022 Chronic systolic (congestive) heart failure: Secondary | ICD-10-CM | POA: Diagnosis not present

## 2019-07-08 DIAGNOSIS — J9601 Acute respiratory failure with hypoxia: Secondary | ICD-10-CM | POA: Diagnosis not present

## 2019-07-09 ENCOUNTER — Ambulatory Visit: Payer: Self-pay | Admitting: Pharmacist

## 2019-07-09 ENCOUNTER — Telehealth: Payer: Self-pay

## 2019-07-09 NOTE — Chronic Care Management (AMB) (Signed)
  Chronic Care Management   Follow Up Note   07/09/2019 Name: Philip [name] Sr. MRN: [id number] DOB: Oct 17, 1945  Referred by: [name], DO Reason for referral : Chronic Care Management (Patient Phone Call)   Philip Potters Sr. is a 74 y.o. year old male who is a primary care patient of [name], DO. The CCM team was consulted for assistance with chronic disease management and care coordination needs.    Was unable to reach patient via telephone today and unable to leave a message as no voicemail picks up.  Plan  The care management team will reach out to the patient again over the next 14 days.   [name], PharmD, [place] [employer] [phone]

## 2019-07-10 ENCOUNTER — Other Ambulatory Visit: Payer: Self-pay | Admitting: Cardiovascular Disease

## 2019-07-10 DIAGNOSIS — J15211 Pneumonia due to Methicillin susceptible Staphylococcus aureus: Secondary | ICD-10-CM | POA: Diagnosis not present

## 2019-07-10 DIAGNOSIS — Z792 Long term (current) use of antibiotics: Secondary | ICD-10-CM | POA: Diagnosis not present

## 2019-07-10 DIAGNOSIS — R7881 Bacteremia: Secondary | ICD-10-CM | POA: Diagnosis not present

## 2019-07-10 DIAGNOSIS — Z09 Encounter for follow-up examination after completed treatment for conditions other than malignant neoplasm: Secondary | ICD-10-CM | POA: Diagnosis not present

## 2019-07-10 DIAGNOSIS — B9561 Methicillin susceptible Staphylococcus aureus infection as the cause of diseases classified elsewhere: Secondary | ICD-10-CM | POA: Diagnosis not present

## 2019-07-10 NOTE — Telephone Encounter (Signed)
Refill Request.  

## 2019-07-11 ENCOUNTER — Encounter: Payer: Self-pay | Admitting: Family Medicine

## 2019-07-11 ENCOUNTER — Other Ambulatory Visit: Payer: Self-pay

## 2019-07-11 ENCOUNTER — Ambulatory Visit (INDEPENDENT_AMBULATORY_CARE_PROVIDER_SITE_OTHER): Payer: Medicare Other | Admitting: Family Medicine

## 2019-07-11 VITALS — BP 100/60 | HR 116 | Temp 97.1°F | Resp 16 | Ht 71.0 in | Wt 202.0 lb

## 2019-07-11 DIAGNOSIS — B9561 Methicillin susceptible Staphylococcus aureus infection as the cause of diseases classified elsewhere: Secondary | ICD-10-CM

## 2019-07-11 DIAGNOSIS — J441 Chronic obstructive pulmonary disease with (acute) exacerbation: Secondary | ICD-10-CM | POA: Diagnosis not present

## 2019-07-11 DIAGNOSIS — R7881 Bacteremia: Secondary | ICD-10-CM | POA: Diagnosis not present

## 2019-07-11 DIAGNOSIS — J189 Pneumonia, unspecified organism: Secondary | ICD-10-CM

## 2019-07-11 LAB — CBC AND DIFFERENTIAL
HCT: 41 (ref 41–53)
Hemoglobin: 13.6 (ref 13.5–17.5)
Neutrophils Absolute: 15
Platelets: 306 (ref 150–399)
WBC: 16.3

## 2019-07-11 LAB — CBC: RBC: 4.43 (ref 3.87–5.11)

## 2019-07-11 LAB — COMPREHENSIVE METABOLIC PANEL
Albumin: 3.1 — AB (ref 3.5–5.0)
Calcium: 8.2 — AB (ref 8.7–10.7)
GFR calc non Af Amer: 71

## 2019-07-11 LAB — BASIC METABOLIC PANEL
CO2: 20 (ref 13–22)
Chloride: 99 (ref 99–108)
Potassium: 3.7 (ref 3.4–5.3)
Sodium: 140 (ref 137–147)

## 2019-07-11 LAB — C-REACTIVE PROTEIN: CRP: 61 — AB (ref 0–10)

## 2019-07-11 LAB — HEPATIC FUNCTION PANEL
ALT: 8 — AB (ref 10–40)
AST: 19 (ref 14–40)

## 2019-07-11 NOTE — Progress Notes (Signed)
Subjective:    Patient ID: Philip Husbands., male    DOB: 1946-02-06, 74 y.o.   MRN: [id number]  Philip ECKSTEIN Sr. is a 74 y.o. male presenting on 07/11/2019 for Hospitalization Follow-up (pneumonia of left lower lobe of lung )   HPI  HOSPITAL FOLLOW-UP VISIT  Hospital/Location: [facility] Date of Admission: 06/26/19 Date of Discharge: 07/05/19 Transitions of care telephone call: Completed on 07/07/19 by Tyler Aas LPN  Reason for Admission: Sepsis, Pneumonia Primary (+Secondary) Diagnosis: MSSA Bacteremia, CAP, COPD Exacerbation resp failure  - Hospital H&P and Discharge Summary have been reviewed - Patient presents today 6 days after recent hospitalization. Brief summary of recent course, patient had symptoms of dyspnea wheezing productive cough, hospitalized, identified PNA on CXR, sepsis criteria and septic shock on admission,  treated with antibiotics IV ancef, had work up with neg repeat blood cx, TEE negative, PICC line and DC'd on ANcef, thought to have respiratory failure w/ PNA due to MSSA and COPD exac, he is on chronic steroids was given a burst taper, and on oxygen temporary then taken off O2..  - Today reports overall has done fairly well after discharge. Symptoms of generalized weakness are improving gradually still not major improved but at times can have good day feels 50% improved, has some dyspnea with increased exertion, also he was coughing up more phlegm especially in morning and would improve with productive cough later. He had some hemoptysis that has improved as well.  - Has Forada. To help PICC line and IV antibiotics, they are coming back on Monday  - He has reduced his dose of Prednisone down to 57m once daily. Has completed taper   I have reviewed the discharge medication list, and have reconciled the current and discharge medications today.   Current Outpatient Medications:  .  Albuterol Sulfate 108 (90 Base) MCG/ACT AEPB, Inhale 2 puffs into  the lungs every 6 (six) hours as needed (shortness of breath). , Disp: , Rfl:  .  Artificial Tear Ointment (DRY EYES OP), Place 1 drop into both eyes daily as needed (for dry eyes)., Disp: , Rfl:  .  atorvastatin (LIPITOR) 40 MG tablet, Take 1 tablet by mouth once daily, Disp: 90 tablet, Rfl: 3 .  Blood Glucose Monitoring Suppl (ONE TOUCH ULTRA 2) w/Device KIT, Use to check blood sugar as advised up to twice a day, Disp: 1 kit, Rfl: 0 .  ceFAZolin (ANCEF) IVPB, Inject 2 g into the vein every 8 (eight) hours for 9 days. Indication: MSSA bacteremia Last Day of Therapy:  07/13/2019 Labs - Once weekly:  CBC/D, CRP and CMP, Disp: 30 Units, Rfl: 0 .  cetirizine (ZYRTEC) 10 MG tablet, Take 10 mg by mouth daily. , Disp: , Rfl:  .  diclofenac sodium (VOLTAREN) 1 % GEL, Apply 2 g topically 3 (three) times daily as needed (Use on elbow bursitis and knee)., Disp: 100 g, Rfl: 2 .  ELIQUIS 5 MG TABS tablet, Take 1 tablet by mouth twice daily, Disp: 60 tablet, Rfl: 0 .  Ensure Max Protein (ENSURE MAX PROTEIN) LIQD, Take 330 mLs (11 oz total) by mouth 2 (two) times daily., Disp: 660 mL, Rfl: 0 .  fluticasone (FLONASE) 50 MCG/ACT nasal spray, Place 1 spray into both nostrils daily as needed for allergies or rhinitis., Disp: , Rfl:  .  furosemide (LASIX) 40 MG tablet, TAKE 1 TABLET BY MOUTH ONCE DAILY. APPOINTMENT NEEDED FOR FURTHER REFILLS. (Patient taking differently: Every other day), Disp:  90 tablet, Rfl: 3 .  gabapentin (NEURONTIN) 100 MG capsule, TAKE 1 CAPSULE BY MOUTH THREE TIMES DAILY, Disp: 270 capsule, Rfl: 2 .  guaiFENesin (MUCINEX) 600 MG 12 hr tablet, Take 600 mg by mouth 2 (two) times daily as needed., Disp: , Rfl:  .  ipratropium-albuterol (DUONEB) 0.5-2.5 (3) MG/3ML SOLN, Take 3 mLs by nebulization 4 (four) times daily., Disp: , Rfl:  .  LANCETS ULTRA THIN MISC, 1 Device by Does not apply route 4 (four) times daily -  before meals and at bedtime., Disp: 200 each, Rfl: 5 .  levothyroxine (SYNTHROID) 88  MCG tablet, Take 1 tablet by mouth once daily, Disp: 90 tablet, Rfl: 1 .  linagliptin (TRADJENTA) 5 MG TABS tablet, Take 1 tablet (5 mg total) by mouth daily., Disp: 30 tablet, Rfl: 0 .  losartan (COZAAR) 25 MG tablet, Take 1 tablet by mouth once daily, Disp: 90 tablet, Rfl: 0 .  metFORMIN (GLUCOPHAGE) 500 MG tablet, TAKE 2 TABLETS BY MOUTH TWICE DAILY WITH A MEAL (Patient taking differently: Take 1,000 mg by mouth 2 (two) times daily with a meal. ), Disp: 360 tablet, Rfl: 1 .  metoprolol tartrate 75 MG TABS, Take 75 mg by mouth 2 (two) times daily., Disp: 60 tablet, Rfl: 0 .  montelukast (SINGULAIR) 10 MG tablet, Take 10 mg by mouth at bedtime. , Disp: , Rfl:  .  nystatin (MYCOSTATIN) 100000 UNIT/ML suspension, Take 5 mLs (500,000 Units total) by mouth 4 (four) times daily., Disp: 60 mL, Rfl: 0 .  ONETOUCH ULTRA test strip, USE TEST STRIP(S) TO CHECK GLUCOSE TWICE DAILY, Disp: 200 each, Rfl: 3 .  predniSONE (DELTASONE) 5 MG tablet, 4 tabs po day1; 3 tabs po day2; 2 tabs po day3,4; 1 tab po day afterwards, Disp: 38 tablet, Rfl: 0 .  sodium chloride flush (NS) 0.9 % SOLN, 20 mLs by Intracatheter route every 8 (eight) hours., Disp: 500 mL, Rfl: 0 .  SYMBICORT 160-4.5 MCG/ACT inhaler, Inhale 2 puffs 2 (two) times daily into the lungs. , Disp: , Rfl:   ------------------------------------------------------------------------- Social History   Tobacco Use  . Smoking status: Former Smoker    Packs/day: 1.00    Years: 8.00    Pack years: 8.00    Types: Cigarettes    Quit date: 12/31/1967    Years since quitting: 51.5  . Smokeless tobacco: Former Network engineer Use Topics  . Alcohol use: No    Alcohol/week: 0.0 standard drinks    Comment: 05/06/2012 "last alcohol several years ago; never had problem wit"  . Drug use: No    Review of Systems  Constitutional: Positive for fatigue. Negative for chills, diaphoresis and fever.  HENT: Negative for congestion and postnasal drip.   Respiratory:  Positive for cough and shortness of breath. Negative for chest tightness.   Cardiovascular: Negative for chest pain and leg swelling.  Endocrine: Negative for cold intolerance.   Per HPI unless specifically indicated above     Objective:    BP 100/60   Pulse (!) 116   Temp (!) 97.1 F (36.2 C) (Temporal)   Resp 16   Ht 5' 11"  (1.803 m)   Wt 202 lb (91.6 kg)   SpO2 96%   BMI 28.17 kg/m   Wt Readings from Last 3 Encounters:  07/11/19 202 lb (91.6 kg)  07/05/19 208 lb 8 oz (94.6 kg)  04/17/19 208 lb (94.3 kg)    Physical Exam Vitals and nursing note reviewed.  Constitutional:  General: He is not in acute distress.    Appearance: He is well-developed. He is not diaphoretic.     Comments: Elderly 74 year old male, fairly well appearing today, comfortable, cooperative  HENT:     Head: Normocephalic and atraumatic.     Comments: Voice is slightly hoarse Eyes:     General:        Right eye: No discharge.        Left eye: No discharge.     Conjunctiva/sclera: Conjunctivae normal.  Neck:     Thyroid: No thyromegaly.  Cardiovascular:     Rate and Rhythm: Normal rate and regular rhythm.     Heart sounds: Normal heart sounds. No murmur.  Pulmonary:     Effort: Pulmonary effort is normal. No respiratory distress.     Breath sounds: Rhonchi present. No wheezing or rales.     Comments: Scattered rhonchi, productive cough clearing thick sputum Musculoskeletal:        General: Normal range of motion.     Cervical back: Normal range of motion and neck supple.  Lymphadenopathy:     Cervical: No cervical adenopathy.  Skin:    General: Skin is warm and dry.     Findings: No erythema or rash.  Neurological:     Mental Status: He is alert and oriented to person, place, and time.  Psychiatric:        Behavior: Behavior normal.     Comments: Well groomed, good eye contact, normal speech and thoughts      CLINICAL DATA:  PICC line placement  EXAM: PORTABLE CHEST 1  VIEW  COMPARISON:  October 06, 2016  FINDINGS: There is a right-sided PICC line with tip terminating over the SVC. The heart size is stable. Aortic calcifications are noted. There is a left basilar airspace opacity favored to represent atelectasis with an infiltrate not excluded. There is no pneumothorax. No large pleural effusion. No acute osseous abnormality.  IMPRESSION: 1. Right-sided PICC line with tip terminating over the SVC. 2. Left basilar airspace opacity favored to represent atelectasis with an infiltrate not excluded.   Electronically Signed   By: Constance Holster M.D.   On: 07/02/2019 15:22   Results for orders placed or performed during the hospital encounter of 06/26/19  Blood Culture (routine x 2)   Specimen: BLOOD  Result Value Ref Range   Specimen Description BLOOD LEFT HAND    Special Requests      BOTTLES DRAWN AEROBIC AND ANAEROBIC Blood Culture adequate volume   Culture      NO GROWTH 5 DAYS Performed at [facility], [address] [place], [place] [address]    Report Status 07/01/2019 FINAL   Blood Culture (routine x 2)   Specimen: BLOOD  Result Value Ref Range   Specimen Description      BLOOD BLOOD RIGHT HAND Performed at [facility], [address] [place], [place] [address]    Special Requests      BOTTLES DRAWN AEROBIC AND ANAEROBIC Blood Culture adequate volume Performed at [facility], [address] [place], [place] [address]    Culture  Setup Time      GRAM POSITIVE COCCI AEROBIC BOTTLE ONLY CRITICAL RESULT CALLED TO, READ BACK BY AND VERIFIED WITH: Weber Cooks PATEL 06/27/19 @ 1400  [place] Performed at [facility], [address] [place], [place] [address]    Culture STAPHYLOCOCCUS AUREUS (A)    Report Status 06/29/2019 FINAL    Organism  ID, Bacteria STAPHYLOCOCCUS AUREUS       Susceptibility   Staphylococcus aureus - MIC*    CIPROFLOXACIN >=8 RESISTANT Resistant     ERYTHROMYCIN 0.5  SENSITIVE Sensitive     GENTAMICIN <=0.5 SENSITIVE Sensitive     OXACILLIN 0.5 SENSITIVE Sensitive     TETRACYCLINE <=1 SENSITIVE Sensitive     VANCOMYCIN <=0.5 SENSITIVE Sensitive     TRIMETH/SULFA <=10 SENSITIVE Sensitive     CLINDAMYCIN <=0.25 SENSITIVE Sensitive     RIFAMPIN <=0.5 SENSITIVE Sensitive     Inducible Clindamycin NEGATIVE Sensitive     * STAPHYLOCOCCUS AUREUS  Urine culture   Specimen: In/Out Cath Urine  Result Value Ref Range   Specimen Description      IN/OUT CATH URINE Performed at [facility], [address], [place], [place] [address]    Special Requests      NONE Performed at [facility], [address], [place], [place] [address]    Culture      NO GROWTH Performed at [facility], [place] [address], [place], [place] [address]    Report Status 06/28/2019 FINAL   Respiratory Panel by RT PCR (Flu A&B, Covid) - Nasopharyngeal Swab   Specimen: Nasopharyngeal Swab  Result Value Ref Range   SARS Coronavirus 2 by RT PCR NEGATIVE NEGATIVE   Influenza A by PCR NEGATIVE NEGATIVE   Influenza B by PCR NEGATIVE NEGATIVE  Culture, sputum-assessment   Specimen: Expectorated Sputum  Result Value Ref Range   Specimen Description EXPECTORATED SPUTUM    Special Requests NONE    Sputum evaluation      THIS SPECIMEN IS ACCEPTABLE FOR SPUTUM CULTURE Performed at [facility], [address] [place], [place] [address]    Report Status 06/27/2019 FINAL   MRSA PCR Screening   Specimen: Nasal Mucosa; Nasopharyngeal  Result Value Ref Range   MRSA by PCR NEGATIVE NEGATIVE  Group A Strep by PCR   Specimen: Throat; Sterile Swab  Result Value Ref Range   Group A Strep by PCR NOT DETECTED NOT DETECTED  Culture, respiratory  Result Value Ref Range   Specimen Description      EXPECTORATED SPUTUM Performed at [facility], [address] [place], [place] [address]    Special Requests      NONE Reflexed from [id number] Performed  at [address] [place], [place] [address]    Gram Stain      ABUNDANT WBC PRESENT,BOTH PMN AND MONONUCLEAR MODERATE GRAM POSITIVE COCCI FEW GRAM POSITIVE RODS RARE GRAM NEGATIVE RODS RARE YEAST MODERATE SQUAMOUS EPITHELIAL CELLS PRESENT Performed at [facility], [place] [address], [place], [place] [address]    Culture FEW STAPHYLOCOCCUS AUREUS FEW CANDIDA ALBICANS     Report Status 07/03/2019 FINAL    Organism ID, Bacteria STAPHYLOCOCCUS AUREUS       Susceptibility   Staphylococcus aureus - MIC*    CIPROFLOXACIN >=8 RESISTANT Resistant     ERYTHROMYCIN 0.5 SENSITIVE Sensitive     GENTAMICIN <=0.5 SENSITIVE Sensitive     OXACILLIN 0.5 SENSITIVE Sensitive     TETRACYCLINE <=1 SENSITIVE Sensitive     VANCOMYCIN <=0.5 SENSITIVE Sensitive     TRIMETH/SULFA <=10 SENSITIVE Sensitive     CLINDAMYCIN <=0.25 SENSITIVE Sensitive     RIFAMPIN <=0.5 SENSITIVE Sensitive     Inducible Clindamycin NEGATIVE Sensitive     * FEW STAPHYLOCOCCUS AUREUS  Culture, blood (single) w Reflex to ID Panel  Specimen: BLOOD  Result Value Ref Range   Specimen Description BLOOD RAC    Special Requests      BOTTLES DRAWN AEROBIC AND ANAEROBIC Blood Culture results may not be optimal due to an excessive volume of blood received in culture bottles   Culture      NO GROWTH 5 DAYS Performed at [facility], [address] [place], [place] [address]    Report Status 07/04/2019 FINAL   CBC WITH DIFFERENTIAL  Result Value Ref Range   WBC 4.0 4.0 - 10.5 K/uL   RBC 4.54 4.22 - 5.81 MIL/uL   Hemoglobin 13.9 13.0 - 17.0 g/dL   HCT 40.4 39.0 - 52.0 %   MCV 89.0 80.0 - 100.0 fL   MCH 30.6 26.0 - 34.0 pg   MCHC 34.4 30.0 - 36.0 g/dL   RDW 12.9 11.5 - 15.5 %   Platelets 162 150 - 400 K/uL   nRBC 0.0 0.0 - 0.2 %   Neutrophils Relative % 72 %   Neutro Abs 2.9 1.7 - 7.7 K/uL   Lymphocytes Relative 18 %   Lymphs Abs 0.7 0.7 - 4.0 K/uL   Monocytes Relative 8 %    Monocytes Absolute 0.3 0.1 - 1.0 K/uL   Eosinophils Relative 2 %   Eosinophils Absolute 0.1 0.0 - 0.5 K/uL   Basophils Relative 0 %   Basophils Absolute 0.0 0.0 - 0.1 K/uL   Immature Granulocytes 0 %   Abs Immature Granulocytes 0.01 0.00 - 0.07 K/uL  APTT  Result Value Ref Range   aPTT 30 24 - 36 seconds  Protime-INR  Result Value Ref Range   Prothrombin Time 18.6 (H) 11.4 - 15.2 seconds   INR 1.6 (H) 0.8 - 1.2  Urinalysis, Routine w reflex microscopic  Result Value Ref Range   Color, Urine YELLOW (A) YELLOW   APPearance CLEAR (A) CLEAR   Specific Gravity, Urine 1.024 1.005 - 1.030   pH 5.0 5.0 - 8.0   Glucose, UA NEGATIVE NEGATIVE mg/dL   Hgb urine dipstick NEGATIVE NEGATIVE   Bilirubin Urine NEGATIVE NEGATIVE   Ketones, ur NEGATIVE NEGATIVE mg/dL   Protein, ur NEGATIVE NEGATIVE mg/dL   Nitrite NEGATIVE NEGATIVE   Leukocytes,Ua TRACE (A) NEGATIVE   RBC / HPF 0-5 0 - 5 RBC/hpf   WBC, UA 6-10 0 - 5 WBC/hpf   Bacteria, UA NONE SEEN NONE SEEN   Squamous Epithelial / LPF NONE SEEN 0 - 5   Mucus PRESENT    Hyaline Casts, UA PRESENT   Brain natriuretic peptide  Result Value Ref Range   B Natriuretic Peptide 583.0 (H) 0.0 - 100.0 pg/mL  Comprehensive metabolic panel  Result Value Ref Range   Sodium 140 135 - 145 mmol/L   Potassium 3.2 (L) 3.5 - 5.1 mmol/L   Chloride 110 98 - 111 mmol/L   CO2 21 (L) 22 - 32 mmol/L   Glucose, Bld 194 (H) 70 - 99 mg/dL   BUN 24 (H) 8 - 23 mg/dL   Creatinine, Ser 1.44 (H) 0.61 - 1.24 mg/dL   Calcium 7.9 (L) 8.9 - 10.3 mg/dL   Total Protein 4.9 (L) 6.5 - 8.1 g/dL   Albumin 3.3 (L) 3.5 - 5.0 g/dL   AST 33 15 - 41 U/L   ALT 24 0 - 44 U/L   Alkaline Phosphatase 80 38 - 126 U/L   Total Bilirubin 1.0 0.3 - 1.2 mg/dL   GFR calc non Af Amer 48 (L) >60 mL/min  GFR calc Af Amer 56 (L) >60 mL/min   Anion gap 9 5 - 15  Lactic acid, plasma  Result Value Ref Range   Lactic Acid, Venous 2.6 (HH) 0.5 - 1.9 mmol/L  Procalcitonin  Result Value Ref  Range   Procalcitonin 7.19 ng/mL  Lactic acid, plasma  Result Value Ref Range   Lactic Acid, Venous 2.9 (HH) 0.5 - 1.9 mmol/L  Lactic acid, plasma  Result Value Ref Range   Lactic Acid, Venous 3.8 (HH) 0.5 - 1.9 mmol/L  HIV Antibody (routine testing w rflx)  Result Value Ref Range   HIV Screen 4th Generation wRfx NON REACTIVE NON REACTIVE  Strep pneumoniae urinary antigen  Result Value Ref Range   Strep Pneumo Urinary Antigen NEGATIVE NEGATIVE  Legionella Pneumophila Serogp 1 Ur Ag  Result Value Ref Range   L. pneumophila Serogp 1 Ur Ag Negative Negative   Source of Sample IN/OUT CATH URINE   Procalcitonin - Baseline  Result Value Ref Range   Procalcitonin 22.84 ng/mL  Procalcitonin  Result Value Ref Range   Procalcitonin 26.84 ng/mL  Magnesium  Result Value Ref Range   Magnesium 1.0 (L) 1.7 - 2.4 mg/dL  Hemoglobin A1c  Result Value Ref Range   Hgb A1c MFr Bld 7.9 (H) 4.8 - 5.6 %   Mean Plasma Glucose 180.03 mg/dL  Glucose, capillary  Result Value Ref Range   Glucose-Capillary 195 (H) 70 - 99 mg/dL  Glucose, capillary  Result Value Ref Range   Glucose-Capillary 257 (H) 70 - 99 mg/dL  CBC with Differential/Platelet  Result Value Ref Range   WBC 17.4 (H) 4.0 - 10.5 K/uL   RBC 4.35 4.22 - 5.81 MIL/uL   Hemoglobin 13.5 13.0 - 17.0 g/dL   HCT 39.6 39.0 - 52.0 %   MCV 91.0 80.0 - 100.0 fL   MCH 31.0 26.0 - 34.0 pg   MCHC 34.1 30.0 - 36.0 g/dL   RDW 13.2 11.5 - 15.5 %   Platelets 216 150 - 400 K/uL   nRBC 0.0 0.0 - 0.2 %   Neutrophils Relative % 86 %   Neutro Abs 14.9 (H) 1.7 - 7.7 K/uL   Lymphocytes Relative 3 %   Lymphs Abs 0.6 (L) 0.7 - 4.0 K/uL   Monocytes Relative 9 %   Monocytes Absolute 1.6 (H) 0.1 - 1.0 K/uL   Eosinophils Relative 1 %   Eosinophils Absolute 0.1 0.0 - 0.5 K/uL   Basophils Relative 0 %   Basophils Absolute 0.0 0.0 - 0.1 K/uL   WBC Morphology      MODERATE LEFT SHIFT (>5% METAS AND MYELOS,OCC PRO NOTED)   RBC Morphology MORPHOLOGY  UNREMARKABLE    Smear Review Normal platelet morphology    Immature Granulocytes 1 %   Abs Immature Granulocytes 0.24 (H) 0.00 - 0.07 K/uL  Basic metabolic panel  Result Value Ref Range   Sodium 141 135 - 145 mmol/L   Potassium 4.5 3.5 - 5.1 mmol/L   Chloride 111 98 - 111 mmol/L   CO2 19 (L) 22 - 32 mmol/L   Glucose, Bld 255 (H) 70 - 99 mg/dL   BUN 26 (H) 8 - 23 mg/dL   Creatinine, Ser 1.52 (H) 0.61 - 1.24 mg/dL   Calcium 7.8 (L) 8.9 - 10.3 mg/dL   GFR calc non Af Amer 45 (L) >60 mL/min   GFR calc Af Amer 52 (L) >60 mL/min   Anion gap 11 5 - 15  Phosphorus  Result Value Ref Range  Phosphorus 2.0 (L) 2.5 - 4.6 mg/dL  Magnesium  Result Value Ref Range   Magnesium 2.5 (H) 1.7 - 2.4 mg/dL  Glucose, capillary  Result Value Ref Range   Glucose-Capillary 257 (H) 70 - 99 mg/dL  Glucose, capillary  Result Value Ref Range   Glucose-Capillary 327 (H) 70 - 99 mg/dL  Glucose, capillary  Result Value Ref Range   Glucose-Capillary 361 (H) 70 - 99 mg/dL  Procalcitonin  Result Value Ref Range   Procalcitonin 18.23 ng/mL  Pathologist smear review  Result Value Ref Range   Path Review Blood smear from June 27 2019 is reviewed.   Basic metabolic panel  Result Value Ref Range   Sodium 139 135 - 145 mmol/L   Potassium 4.4 3.5 - 5.1 mmol/L   Chloride 109 98 - 111 mmol/L   CO2 24 22 - 32 mmol/L   Glucose, Bld 244 (H) 70 - 99 mg/dL   BUN 32 (H) 8 - 23 mg/dL   Creatinine, Ser 1.25 (H) 0.61 - 1.24 mg/dL   Calcium 8.2 (L) 8.9 - 10.3 mg/dL   GFR calc non Af Amer 57 (L) >60 mL/min   GFR calc Af Amer >60 >60 mL/min   Anion gap 6 5 - 15  Magnesium  Result Value Ref Range   Magnesium 2.4 1.7 - 2.4 mg/dL  CBC  Result Value Ref Range   WBC 12.8 (H) 4.0 - 10.5 K/uL   RBC 4.12 (L) 4.22 - 5.81 MIL/uL   Hemoglobin 12.6 (L) 13.0 - 17.0 g/dL   HCT 36.6 (L) 39.0 - 52.0 %   MCV 88.8 80.0 - 100.0 fL   MCH 30.6 26.0 - 34.0 pg   MCHC 34.4 30.0 - 36.0 g/dL   RDW 13.1 11.5 - 15.5 %   Platelets  141 (L) 150 - 400 K/uL   nRBC 0.0 0.0 - 0.2 %  Phosphorus  Result Value Ref Range   Phosphorus 2.1 (L) 2.5 - 4.6 mg/dL  Glucose, capillary  Result Value Ref Range   Glucose-Capillary 332 (H) 70 - 99 mg/dL  Glucose, capillary  Result Value Ref Range   Glucose-Capillary 194 (H) 70 - 99 mg/dL  Glucose, capillary  Result Value Ref Range   Glucose-Capillary 286 (H) 70 - 99 mg/dL  Glucose, capillary  Result Value Ref Range   Glucose-Capillary 327 (H) 70 - 99 mg/dL  Basic metabolic panel  Result Value Ref Range   Sodium 137 135 - 145 mmol/L   Potassium 4.1 3.5 - 5.1 mmol/L   Chloride 106 98 - 111 mmol/L   CO2 21 (L) 22 - 32 mmol/L   Glucose, Bld 212 (H) 70 - 99 mg/dL   BUN 30 (H) 8 - 23 mg/dL   Creatinine, Ser 1.07 0.61 - 1.24 mg/dL   Calcium 8.5 (L) 8.9 - 10.3 mg/dL   GFR calc non Af Amer >60 >60 mL/min   GFR calc Af Amer >60 >60 mL/min   Anion gap 10 5 - 15  Magnesium  Result Value Ref Range   Magnesium 2.5 (H) 1.7 - 2.4 mg/dL  Phosphorus  Result Value Ref Range   Phosphorus 2.4 (L) 2.5 - 4.6 mg/dL  CBC  Result Value Ref Range   WBC 17.4 (H) 4.0 - 10.5 K/uL   RBC 4.24 4.22 - 5.81 MIL/uL   Hemoglobin 12.9 (L) 13.0 - 17.0 g/dL   HCT 37.6 (L) 39.0 - 52.0 %   MCV 88.7 80.0 - 100.0 fL   MCH 30.4 26.0 -  34.0 pg   MCHC 34.3 30.0 - 36.0 g/dL   RDW 13.3 11.5 - 15.5 %   Platelets 162 150 - 400 K/uL   nRBC 0.0 0.0 - 0.2 %  Glucose, capillary  Result Value Ref Range   Glucose-Capillary 237 (H) 70 - 99 mg/dL  Glucose, capillary  Result Value Ref Range   Glucose-Capillary 187 (H) 70 - 99 mg/dL  Glucose, capillary  Result Value Ref Range   Glucose-Capillary 227 (H) 70 - 99 mg/dL  Glucose, capillary  Result Value Ref Range   Glucose-Capillary 261 (H) 70 - 99 mg/dL  Basic metabolic panel  Result Value Ref Range   Sodium 141 135 - 145 mmol/L   Potassium 4.2 3.5 - 5.1 mmol/L   Chloride 108 98 - 111 mmol/L   CO2 26 22 - 32 mmol/L   Glucose, Bld 137 (H) 70 - 99 mg/dL   BUN 27  (H) 8 - 23 mg/dL   Creatinine, Ser 0.92 0.61 - 1.24 mg/dL   Calcium 8.4 (L) 8.9 - 10.3 mg/dL   GFR calc non Af Amer >60 >60 mL/min   GFR calc Af Amer >60 >60 mL/min   Anion gap 7 5 - 15  Phosphorus  Result Value Ref Range   Phosphorus 2.6 2.5 - 4.6 mg/dL  CBC  Result Value Ref Range   WBC 15.5 (H) 4.0 - 10.5 K/uL   RBC 4.05 (L) 4.22 - 5.81 MIL/uL   Hemoglobin 12.6 (L) 13.0 - 17.0 g/dL   HCT 35.6 (L) 39.0 - 52.0 %   MCV 87.9 80.0 - 100.0 fL   MCH 31.1 26.0 - 34.0 pg   MCHC 35.4 30.0 - 36.0 g/dL   RDW 13.3 11.5 - 15.5 %   Platelets 168 150 - 400 K/uL   nRBC 0.0 0.0 - 0.2 %  Glucose, capillary  Result Value Ref Range   Glucose-Capillary 326 (H) 70 - 99 mg/dL  Glucose, capillary  Result Value Ref Range   Glucose-Capillary 127 (H) 70 - 99 mg/dL  Glucose, capillary  Result Value Ref Range   Glucose-Capillary 145 (H) 70 - 99 mg/dL  Brain natriuretic peptide  Result Value Ref Range   B Natriuretic Peptide 857.0 (H) 0.0 - 100.0 pg/mL  TSH  Result Value Ref Range   TSH 2.676 0.350 - 4.500 uIU/mL  Magnesium  Result Value Ref Range   Magnesium 2.3 1.7 - 2.4 mg/dL  Basic metabolic panel  Result Value Ref Range   Sodium 141 135 - 145 mmol/L   Potassium 3.5 3.5 - 5.1 mmol/L   Chloride 105 98 - 111 mmol/L   CO2 26 22 - 32 mmol/L   Glucose, Bld 130 (H) 70 - 99 mg/dL   BUN 23 8 - 23 mg/dL   Creatinine, Ser 0.93 0.61 - 1.24 mg/dL   Calcium 8.2 (L) 8.9 - 10.3 mg/dL   GFR calc non Af Amer >60 >60 mL/min   GFR calc Af Amer >60 >60 mL/min   Anion gap 10 5 - 15  CBC  Result Value Ref Range   WBC 12.6 (H) 4.0 - 10.5 K/uL   RBC 4.66 4.22 - 5.81 MIL/uL   Hemoglobin 14.2 13.0 - 17.0 g/dL   HCT 41.0 39.0 - 52.0 %   MCV 88.0 80.0 - 100.0 fL   MCH 30.5 26.0 - 34.0 pg   MCHC 34.6 30.0 - 36.0 g/dL   RDW 13.4 11.5 - 15.5 %   Platelets 223 150 - 400 K/uL  nRBC 0.2 0.0 - 0.2 %  Glucose, capillary  Result Value Ref Range   Glucose-Capillary 172 (H) 70 - 99 mg/dL  Glucose, capillary    Result Value Ref Range   Glucose-Capillary 247 (H) 70 - 99 mg/dL  Glucose, capillary  Result Value Ref Range   Glucose-Capillary 131 (H) 70 - 99 mg/dL  Glucose, capillary  Result Value Ref Range   Glucose-Capillary 98 70 - 99 mg/dL  Glucose, capillary  Result Value Ref Range   Glucose-Capillary 143 (H) 70 - 99 mg/dL  Basic metabolic panel  Result Value Ref Range   Sodium 138 135 - 145 mmol/L   Potassium 3.7 3.5 - 5.1 mmol/L   Chloride 102 98 - 111 mmol/L   CO2 25 22 - 32 mmol/L   Glucose, Bld 148 (H) 70 - 99 mg/dL   BUN 22 8 - 23 mg/dL   Creatinine, Ser 1.05 0.61 - 1.24 mg/dL   Calcium 8.5 (L) 8.9 - 10.3 mg/dL   GFR calc non Af Amer >60 >60 mL/min   GFR calc Af Amer >60 >60 mL/min   Anion gap 11 5 - 15  CBC  Result Value Ref Range   WBC 10.7 (H) 4.0 - 10.5 K/uL   RBC 4.58 4.22 - 5.81 MIL/uL   Hemoglobin 13.9 13.0 - 17.0 g/dL   HCT 40.7 39.0 - 52.0 %   MCV 88.9 80.0 - 100.0 fL   MCH 30.3 26.0 - 34.0 pg   MCHC 34.2 30.0 - 36.0 g/dL   RDW 13.2 11.5 - 15.5 %   Platelets 241 150 - 400 K/uL   nRBC 0.3 (H) 0.0 - 0.2 %  Glucose, capillary  Result Value Ref Range   Glucose-Capillary 144 (H) 70 - 99 mg/dL  Glucose, capillary  Result Value Ref Range   Glucose-Capillary 123 (H) 70 - 99 mg/dL  Glucose, capillary  Result Value Ref Range   Glucose-Capillary 115 (H) 70 - 99 mg/dL  Basic metabolic panel  Result Value Ref Range   Sodium 139 135 - 145 mmol/L   Potassium 4.2 3.5 - 5.1 mmol/L   Chloride 105 98 - 111 mmol/L   CO2 25 22 - 32 mmol/L   Glucose, Bld 182 (H) 70 - 99 mg/dL   BUN 27 (H) 8 - 23 mg/dL   Creatinine, Ser 0.92 0.61 - 1.24 mg/dL   Calcium 8.3 (L) 8.9 - 10.3 mg/dL   GFR calc non Af Amer >60 >60 mL/min   GFR calc Af Amer >60 >60 mL/min   Anion gap 9 5 - 15  CBC  Result Value Ref Range   WBC 11.1 (H) 4.0 - 10.5 K/uL   RBC 4.20 (L) 4.22 - 5.81 MIL/uL   Hemoglobin 12.7 (L) 13.0 - 17.0 g/dL   HCT 37.6 (L) 39.0 - 52.0 %   MCV 89.5 80.0 - 100.0 fL   MCH 30.2  26.0 - 34.0 pg   MCHC 33.8 30.0 - 36.0 g/dL   RDW 13.1 11.5 - 15.5 %   Platelets 225 150 - 400 K/uL   nRBC 0.0 0.0 - 0.2 %  Glucose, capillary  Result Value Ref Range   Glucose-Capillary 265 (H) 70 - 99 mg/dL  Glucose, capillary  Result Value Ref Range   Glucose-Capillary 235 (H) 70 - 99 mg/dL  Glucose, capillary  Result Value Ref Range   Glucose-Capillary 158 (H) 70 - 99 mg/dL  Glucose, capillary  Result Value Ref Range   Glucose-Capillary 220 (H) 70 - 99  mg/dL  Glucose, capillary  Result Value Ref Range   Glucose-Capillary 314 (H) 70 - 99 mg/dL  Basic metabolic panel  Result Value Ref Range   Sodium 138 135 - 145 mmol/L   Potassium 4.0 3.5 - 5.1 mmol/L   Chloride 106 98 - 111 mmol/L   CO2 27 22 - 32 mmol/L   Glucose, Bld 154 (H) 70 - 99 mg/dL   BUN 30 (H) 8 - 23 mg/dL   Creatinine, Ser 0.89 0.61 - 1.24 mg/dL   Calcium 8.1 (L) 8.9 - 10.3 mg/dL   GFR calc non Af Amer >60 >60 mL/min   GFR calc Af Amer >60 >60 mL/min   Anion gap 5 5 - 15  Glucose, capillary  Result Value Ref Range   Glucose-Capillary 226 (H) 70 - 99 mg/dL  Glucose, capillary  Result Value Ref Range   Glucose-Capillary 158 (H) 70 - 99 mg/dL  Glucose, capillary  Result Value Ref Range   Glucose-Capillary 98 70 - 99 mg/dL  Glucose, capillary  Result Value Ref Range   Glucose-Capillary 133 (H) 70 - 99 mg/dL  Glucose, capillary  Result Value Ref Range   Glucose-Capillary 189 (H) 70 - 99 mg/dL  Glucose, capillary  Result Value Ref Range   Glucose-Capillary 275 (H) 70 - 99 mg/dL   Comment 1 Notify RN    Comment 2 Document in Chart   Glucose, capillary  Result Value Ref Range   Glucose-Capillary 133 (H) 70 - 99 mg/dL  Glucose, capillary  Result Value Ref Range   Glucose-Capillary 110 (H) 70 - 99 mg/dL  POC SARS Coronavirus 2 Ag  Result Value Ref Range   SARS Coronavirus 2 Ag NEGATIVE NEGATIVE  ECHOCARDIOGRAM COMPLETE  Result Value Ref Range   Weight 3,464 oz   Height 71 in   BP 125/86 mmHg    Troponin I (High Sensitivity)  Result Value Ref Range   Troponin I (High Sensitivity) 8 <18 ng/L  Troponin I (High Sensitivity)  Result Value Ref Range   Troponin I (High Sensitivity) 7 <18 ng/L      Assessment & Plan:   Problem List Items Addressed This Visit    MSSA bacteremia   COPD with acute exacerbation ([place]) - Primary   Community acquired pneumonia of left lower lobe of lung      #MSSA Bacteremia - resolving Secondary complication with CAP LLL, due to MSSA Resolved acute respiratory failure now, off oxygen Improving on antibiotics, finishing PICC IV Ancef then Dr Ola Spurr ID has placed him on Keflex oral course after finish IV Reviewed labs faxed from [facility] / Dr Ola Spurr, abstracted COPD improving - still with significant decline in lung function, but maintaining oxygen now, POx 97% on Room Air, frequent cough, improving on prednisone taper now. Continue Mucinex and breathing treatments If any decline instead of gradual improvement, or worsening dyspnea lung function can notify us and we may increase prednisone again temporarily for longer taper.  No orders of the defined types were placed in this encounter.   Follow up plan: Return in about 1 week (around 07/18/2019), or if symptoms worsen or fail to improve, for PNA / COPD.   [name], [facility] 07/11/2019, 11:48 AM

## 2019-07-11 NOTE — Patient Instructions (Addendum)
Thank you for coming to the office today.  Let me know if need to be back on daily steroid for longer, let me know. Otherwise keep on current plan.  Keep up with Dr Ola Spurr with antibiotic plan to finish pills after.  Lungs are still consistent with residual COPD and Infection it should keep improving.  Please schedule a Follow-up Appointment to: Return in about 1 week (around 07/18/2019), or if symptoms worsen or fail to improve, for PNA / COPD.  If you have any other questions or concerns, please feel free to call the office or send a message through [place]. You may also schedule an earlier appointment if necessary.  Additionally, you may be receiving a survey about your experience at our office within a few days to 1 week by e-mail or mail. We value your feedback.  [name], DO [place]

## 2019-07-12 DIAGNOSIS — A419 Sepsis, unspecified organism: Secondary | ICD-10-CM | POA: Diagnosis not present

## 2019-07-12 DIAGNOSIS — J189 Pneumonia, unspecified organism: Secondary | ICD-10-CM | POA: Diagnosis not present

## 2019-07-13 NOTE — Progress Notes (Addendum)
Cardiology Office Note  Date: 07/14/2019   ID: Philip Potters Sr., DOB 30-Aug-1945, MRN [id number]  PCP:  [name], DO  Cardiologist:  [name], MD Electrophysiologist:  None   Chief Complaint: F/U CAD, Afib RVR, Cardiomyopathy. Systolic (HFrEF),  History of Present Illness: Philip MACKE Sr. is a 74 y.o. male with a history of CAD status post CABG x 2014 (LIMA-LAD, SVG-LCx and SVG-RPDA), HTN, HLD, DM 2, DD, PAF (on Eliquis), ectopic atrial tachycardia, COPD.   In the past has not been interested in catheter ablation for ectopic atrial tachycardia and has been managed on beta-blocker therapy.  Recently had a hospital admission June 26, 2019 (stayed 10 days) with complaints of shortness of breath.  Admission diagnosis was acute hypoxic respiratory failure secondary to MSSA pneumonia, COPD exacerbation, chronic systolic congestive heart failure, PAF, septic shock, acute kidney injury, type 2 diabetes. Trop I; 8>7 BNP 857  He was discharged 07/05/2019 with PICC line and IV Ancef infusions with plans to start oral Keflex after IV antibiotics. He still has PICC line in today but will have it removed tomorrow. He started his course of Keflex for 7 days today  He had a TTE followed by a TEE 8 days later. TTE on 06/29/2019 showed EF 40 to 45% with LV global hypokinesis moderately dilated LV internal cavity size, mild to moderate mitral valve regurgitation.  TEE on 07/04/2019 showed an EF of 30 to 35% global hypokinesis, mild to moderate mitral valve regurgitation, Mild grade 2 plaque involving the descending aorta. No vegetations noted.   Today he states his this is the best he has felt in quite some time.  He will have his PICC line removed tomorrow.  He started his Keflex 7-day course of antibiotics today.  He will have follow-up with his infectious disease physician within the next week and has a referral for pulmonology to be seen within the next month.  States he will had  no issues walking from the car to the clinic today.  He denies any progressive anginal or exertional symptoms.  States he still feels a little weak from getting over the pneumonia and sepsis.  He remains in atrial fibrillation with rate controlled.    Past Medical History:  Diagnosis Date  . Arthritis    a. right knee  . Asthma   . Atrial tachycardia ([place])   . 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 etiology.  [name] COPD (chronic obstructive pulmonary disease) ([place])   . Coronary artery disease    a. 05/2012 Cath: severe 3VD-->CABG x 4 by Dr. Servando Snare in 1/2014[place] Jun, SVG->LCX & SVG->RPDA; b. 06/2014 MV: no ischemia/infarct.  . Diastolic dysfunction    a. 06/2015 Echo: EF 50-55%, Gr1 DD. Mild MR. Mildly dil LA. Nl RV fxn. Nl PASP.  [name] Edema    LEGS/ FEET  . Essential hypertension   . History of bronchitis   . HOH (hard of hearing)   . Hypercholesteremia   . Neuropathy   . PAF (paroxysmal atrial fibrillation) (HCC)    a. CHA2DS2VASc = 4-->Eliquis.  . Shingles   . Sleep apnea    NO CPAP  . Type II diabetes mellitus ([place])     Past Surgical History:  Procedure Laterality Date  . ANTERIOR CERVICAL DECOMP/DISCECTOMY FUSION  ~ 2009  . BACK SURGERY     CERVICAL FUSION  . CARDIAC CATHETERIZATION  05/06/2012   Significant three-vessel coronary artery disease with normal ejection  fraction  . CATARACT EXTRACTION W/PHACO Right 08/15/2017   Procedure: CATARACT EXTRACTION PHACO AND INTRAOCULAR LENS PLACEMENT (IOC);  Surgeon: Birder Robson, MD;  Location: [facility];  Service: Ophthalmology;  Laterality: Right;  [place] 00:23 AP% 13.3 CDE 3.11 Fluid pack lot # [id number]  . CATARACT EXTRACTION W/PHACO Left 10/03/2017   Procedure: CATARACT EXTRACTION PHACO AND INTRAOCULAR LENS PLACEMENT (IOC);  Surgeon: Birder Robson, MD;  Location: [facility];  Service: Ophthalmology;  Laterality: Left;  Lot #[id number] H [place]: 00:21.2 AP%:13.9 CDE: 2.96  . CORONARY ANGIOPLASTY    . CORONARY ARTERY BYPASS GRAFT   05/07/2012   Procedure: CORONARY ARTERY BYPASS GRAFTING (CABG);  Surgeon: [name], MD;  Location: [facility];  Service: Open Heart Surgery;  Laterality: N/A;  times three  . INTRAOPERATIVE TRANSESOPHAGEAL ECHOCARDIOGRAM  05/07/2012   Procedure: INTRAOPERATIVE TRANSESOPHAGEAL ECHOCARDIOGRAM;  Surgeon: [name], MD;  Location: [facility];  Service: Open Heart Surgery;  Laterality: N/A;  . KNEE ARTHROSCOPY  ~ 2000   "right" (05/06/2012)  . KNEE SURGERY  2003   right  . NECK SURGERY  2008   Plate in neck  . TEE WITHOUT CARDIOVERSION N/A 07/04/2019   Procedure: TRANSESOPHAGEAL ECHOCARDIOGRAM (TEE);  Surgeon: Nelva Bush, MD;  Location: [facility];  Service: Cardiovascular;  Laterality: N/A;    Current Outpatient Medications  Medication Sig Dispense Refill  . Albuterol Sulfate 108 (90 Base) MCG/ACT AEPB Inhale 2 puffs into the lungs every 6 (six) hours as needed (shortness of breath).     . Artificial Tear Ointment (DRY EYES OP) Place 1 drop into both eyes daily as needed (for dry eyes).    [name] atorvastatin (LIPITOR) 40 MG tablet Take 1 tablet by mouth once daily 90 tablet 3  . Blood Glucose Monitoring Suppl (ONE TOUCH ULTRA 2) w/Device KIT Use to check blood sugar as advised up to twice a day 1 kit 0  . cetirizine (ZYRTEC) 10 MG tablet Take 10 mg by mouth daily.     . diclofenac sodium (VOLTAREN) 1 % GEL Apply 2 g topically 3 (three) times daily as needed (Use on elbow bursitis and knee). 100 g 2  . ELIQUIS 5 MG TABS tablet Take 1 tablet by mouth twice daily 60 tablet 0  . Ensure Max Protein (ENSURE MAX PROTEIN) LIQD Take 330 mLs (11 oz total) by mouth 2 (two) times daily. 660 mL 0  . fluticasone (FLONASE) 50 MCG/ACT nasal spray Place 1 spray into both nostrils daily as needed for allergies or rhinitis.    . furosemide (LASIX) 40 MG tablet TAKE 1 TABLET BY MOUTH ONCE DAILY. APPOINTMENT NEEDED FOR FURTHER REFILLS. (Patient taking differently: Every other day) 90 tablet 3  . gabapentin  (NEURONTIN) 100 MG capsule TAKE 1 CAPSULE BY MOUTH THREE TIMES DAILY 270 capsule 2  . guaiFENesin (MUCINEX) 600 MG 12 hr tablet Take 600 mg by mouth 2 (two) times daily.     [name] ipratropium-albuterol (DUONEB) 0.5-2.5 (3) MG/3ML SOLN Take 3 mLs by nebulization 4 (four) times daily.    [name] LANCETS ULTRA THIN MISC 1 Device by Does not apply route 4 (four) times daily -  before meals and at bedtime. 200 each 5  . levothyroxine (SYNTHROID) 88 MCG tablet Take 1 tablet by mouth once daily 90 tablet 1  . linagliptin (TRADJENTA) 5 MG TABS tablet Take 1 tablet (5 mg total) by mouth daily. 30 tablet 0  . losartan (COZAAR) 25 MG tablet Take 0.5 tablets (12.5 mg total) by mouth daily. [place]  tablet 3  . metFORMIN (GLUCOPHAGE) 500 MG tablet TAKE 2 TABLETS BY MOUTH TWICE DAILY WITH A MEAL (Patient taking differently: Take 1,000 mg by mouth 2 (two) times daily with a meal. ) 360 tablet 1  . metoprolol tartrate 75 MG TABS Take 75 mg by mouth 2 (two) times daily. 60 tablet 0  . nystatin (MYCOSTATIN) 100000 UNIT/ML suspension Take 5 mLs (500,000 Units total) by mouth 4 (four) times daily. 60 mL 0  . ONETOUCH ULTRA test strip USE TEST STRIP(S) TO CHECK GLUCOSE TWICE DAILY 200 each 3  . predniSONE (DELTASONE) 5 MG tablet 4 tabs po day1; 3 tabs po day2; 2 tabs po day3,4; 1 tab po day afterwards 38 tablet 0  . sodium chloride flush (NS) 0.9 % SOLN 20 mLs by Intracatheter route every 8 (eight) hours. 500 mL 0  . SYMBICORT 160-4.5 MCG/ACT inhaler Inhale 2 puffs 2 (two) times daily into the lungs.      No current facility-administered medications for this visit.   Allergies:  Bydureon [exenatide], Clindamycin/lincomycin, Penicillins, Sulfa antibiotics, Spiriferis, Spiriva handihaler [tiotropium bromide monohydrate], and Ivp dye [iodinated diagnostic agents]   Social History: The patient  reports that he quit smoking about 51 years ago. His smoking use included cigarettes. He has a 8.00 pack-year smoking history. He has quit using  smokeless tobacco. He reports that he does not drink alcohol or use drugs.   Family History: The patient's family history includes Clotting disorder in his mother; Heart disease in his mother; Hypertension in his sister.   ROS:  Please see the history of present illness. Otherwise, complete review of systems is positive for none.  All other systems are reviewed and negative.   Physical Exam: VS:  BP 100/60 (BP Location: Left Arm, Patient Position: Sitting, Cuff Size: Normal)   Pulse 85   Ht 5' 11.5" (1.816 m)   Wt 203 lb (92.1 kg)   BMI 27.92 kg/m , BMI Body mass index is 27.92 kg/m.  Wt Readings from Last 3 Encounters:  07/14/19 203 lb (92.1 kg)  07/11/19 202 lb (91.6 kg)  07/05/19 208 lb 8 oz (94.6 kg)    General: Patient appears comfortable at rest. Neck: Supple, no elevated JVP or carotid bruits, no thyromegaly. Lungs: Clear to auscultation, nonlabored breathing at rest. Cardiac: Irregularly irregular rate and rhythm, no S3 or significant systolic murmur, no pericardial rub.  Extremities: No pitting edema, distal pulses 2+. Skin: Warm and dry. Musculoskeletal: No kyphosis. Neuropsychiatric: Alert and oriented x3, affect grossly appropriate.  ECG:  An ECG dated 07/14/2019 was personally reviewed today and demonstrated:  Atrial fibrillation with competing junctional pacemaker.  Rate of 85.  Recent Labwork: 06/30/2019: B Natriuretic Peptide 857.0; Magnesium 2.3; TSH 2.676 07/04/2019: BUN 30; Creatinine, Ser 0.89 07/11/2019: ALT 8; AST 19; Hemoglobin 13.6; Platelets 306; Potassium 3.7; Sodium 140     Component Value Date/Time   CHOL 125 06/24/2018 0846   CHOL 139 12/14/2014 0834   TRIG 97 06/24/2018 0846   HDL 46 06/24/2018 0846   HDL 43 12/14/2014 0834   CHOLHDL 2.7 06/24/2018 0846   LDLCALC 61 06/24/2018 0846    Other Studies Reviewed Today:  TEE 07/04/2019 1. Left ventricular ejection fraction, by estimation, is 30 to 35%. The left ventricle has moderately decreased  function. The left ventricle demonstrates global hypokinesis. 2. Right ventricular systolic function is mildly reduced. The right ventricular size is mildly enlarged. 3. No left atrial/left atrial appendage thrombus was detected. 4. The mitral valve is  degenerative. Mild to moderate mitral valve regurgitation. No evidence of mitral stenosis. 5. The aortic valve is tricuspid. Aortic valve regurgitation is not visualized. Mild aortic valve sclerosis is present, with no evidence of aortic valve stenosis. 6. There is mild (Grade II) plaque involving the descending aorta.   TTE 06/29/2019 1. Left ventricular ejection fraction, by estimation, is 40 to 45%. The left ventricle has mildly decreased function. The left ventricle demonstrates global hypokinesis. The left ventricular internal cavity size was moderately dilated. Left ventricular diastolic parameters are indeterminate. 2. Right ventricular systolic function is mildly reduced. The right ventricular size is mildly enlarged. There is moderately elevated pulmonary artery systolic pressure. 3. Left atrial size was mildly dilated. 4. Right atrial size was mildly dilated. 5. The mitral valve is normal in structure and function. Mild to moderate mitral valve regurgitation. No evidence of mitral stenosis. 6. Tricuspid valve regurgitation is mild to moderate. 7. No valve vegetation noted.  Assessment and Plan:  1. Coronary artery disease involving native coronary artery of native heart without angina pectoris   2. Paroxysmal atrial fibrillation (HCC)   3. Essential hypertension   4. Mixed hyperlipidemia   5. Hyperlipidemia, unspecified hyperlipidemia type   6. Shortness of breath      1. Coronary artery disease involving native coronary artery of native heart without angina pectoris  CABG x 2014 (LIMA-LAD, SVG-LCx and SVG-RPDA) Has recently decreased EF 30-35% with recent acute HFrEF presentation and acute hypoxic respiratory failure, PNA,  sepsis/septic shock. Hospital LOS 10 days.  EF is decreased with recent TEE showing EF of 30 to 35%.  This could be due to ischemia or persistent tachyarrhythmias.  Get a Lexiscan stress test to rule out ischemic etiology.   2. Paroxysmal atrial fibrillation (HCC) CHADsVASc = 5 (Age, Sex, CHF, HTN, Aortic plaque, DM) Patient remains in atrial fibrillation with controlled rate today.  EKG: Atrial fibrillation with competing junctional pacemaker, nonspecific T wave abnormality, rate of 85.  Patient states he cannot feel the abnormal rhythm.  States he walked from the parking lot to the clinic with no significant anginal or exertional symptoms.  Discussed with patient possible DC synchronized cardioversion in the future.  Patient is on Eliquis which was recently started on 07/05/2019.(will have 3 weeks of anti-coagulation therapy on 07/26/2019).  Discussed the fact that he needed to be anticoagulated at least 3 weeks before considering cardioversion. Denies any bleeding.   Discussed what cardioversion is and how it is performed.  Patient verbalizes understanding.  Informed patient this would be the next step and treatment of his atrial fibrillation.  Informed him provider will discuss with him further at follow-up in 3 weeks.  Continue metoprolol 75 mg p.o. twice daily, continue Eliquis 5 mg p.o. twice daily.   3. Essential hypertension Blood pressure is soft today at 100/60.  Decrease losartan to 12.5 mg daily.  Patient is having no orthostatic symptoms or near syncopal/syncopal episodes with lower blood pressure today.  4. Mixed hyperlipidemia Last lipid panel 06/24/2018 with total cholesterol 125, HDL 46, triglycerides 97, LDL 61.  Patient is on atorvastatin 40 mg.  Continue atorvastatin.   Medication Adjustments/Labs and Tests Ordered: Current medicines are reviewed at length with the patient today.  Concerns regarding medicines are outlined above.   Disposition: Follow-up with [name] or APP 3 weeks     Signed, Levell July, NP 07/14/2019 4:07 PM    [facility]

## 2019-07-14 ENCOUNTER — Other Ambulatory Visit: Payer: Self-pay

## 2019-07-14 ENCOUNTER — Ambulatory Visit (INDEPENDENT_AMBULATORY_CARE_PROVIDER_SITE_OTHER): Payer: Medicare Other | Admitting: Family Medicine

## 2019-07-14 ENCOUNTER — Encounter: Payer: Self-pay | Admitting: Family Medicine

## 2019-07-14 VITALS — BP 100/60 | HR 85 | Ht 71.5 in | Wt 203.0 lb

## 2019-07-14 DIAGNOSIS — E785 Hyperlipidemia, unspecified: Secondary | ICD-10-CM | POA: Diagnosis not present

## 2019-07-14 DIAGNOSIS — I10 Essential (primary) hypertension: Secondary | ICD-10-CM

## 2019-07-14 DIAGNOSIS — R0602 Shortness of breath: Secondary | ICD-10-CM

## 2019-07-14 DIAGNOSIS — I2510 Atherosclerotic heart disease of native coronary artery without angina pectoris: Secondary | ICD-10-CM | POA: Diagnosis not present

## 2019-07-14 DIAGNOSIS — E782 Mixed hyperlipidemia: Secondary | ICD-10-CM | POA: Diagnosis not present

## 2019-07-14 DIAGNOSIS — I480 Paroxysmal atrial fibrillation: Secondary | ICD-10-CM

## 2019-07-14 MED ORDER — LOSARTAN POTASSIUM 25 MG PO TABS: 12.5000 mg | ORAL_TABLET | Freq: Every day | ORAL | 3 refills | Status: DC

## 2019-07-14 NOTE — Patient Instructions (Signed)
Medication Instructions:  1- DECREASE Losartan Take 0.5 tablets (12.5 mg total) by mouth daily. *If you need a refill on your cardiac medications before your next appointment, please call your pharmacy*   Lab Work: None ordered  If you have labs (blood work) drawn today and your tests are completely normal, you will receive your results only by: [name] MyChart Message (if you have MyChart) OR . A paper copy in the mail If you have any lab test that is abnormal or we need to change your treatment, we will call you to review the results.   Testing/Procedures: 1- [place]  Your caregiver has ordered a Stress Test with nuclear imaging. The purpose of this test is to evaluate the blood supply to your heart muscle. This procedure is referred to as a "Non-Invasive Stress Test." This is because other than having an IV started in your vein, nothing is inserted or "invades" your body. Cardiac stress tests are done to find areas of poor blood flow to the heart by determining the extent of coronary artery disease (CAD). Some patients exercise on a treadmill, which naturally increases the blood flow to your heart, while others who are  unable to walk on a treadmill due to physical limitations have a pharmacologic/chemical stress agent called Lexiscan . This medicine will mimic walking on a treadmill by temporarily increasing your coronary blood flow.   Please note: these test may take anywhere between 2-4 hours to complete  PLEASE REPORT TO [place] AT THE FIRST DESK WILL DIRECT YOU WHERE TO GO  Date of Procedure:_____________________________________  Arrival Time for Procedure:______________________________  Instructions regarding medication:   __x__ : Hold diabetes medication (meformin 24 hours prior)  _x___:  Hold betablocker(s) night before procedure and morning of procedure (metoprolol)  __x__:  Hold other medications as follows:_______Hold Lasix the am  of__________________________   PLEASE NOTIFY THE OFFICE AT LEAST 24 HOURS IN ADVANCE IF YOU ARE UNABLE TO KEEP YOUR APPOINTMENT.  [phone] AND  PLEASE NOTIFY NUCLEAR MEDICINE AT [facility] AT LEAST 24 HOURS IN ADVANCE IF YOU ARE UNABLE TO KEEP YOUR APPOINTMENT. [phone]  How to prepare for your Myoview test:  1. Do not eat or drink after midnight 2. No caffeine for 24 hours prior to test 3. No smoking 24 hours prior to test. 4. Your medication may be taken with water.  If your doctor stopped a medication because of this test, do not take that medication. 5. Ladies, please do not wear dresses.  Skirts or pants are appropriate. Please wear a short sleeve shirt. 6. No perfume, cologne or lotion. 7. Wear comfortable walking shoes. No heels!    Follow-Up: At [facility], you and your health needs are our priority.  As part of our continuing mission to provide you with exceptional heart care, we have created designated Provider Care Teams.  These Care Teams include your primary Cardiologist (physician) and Advanced Practice Providers (APPs -  Physician Assistants and Nurse Practitioners) who all work together to provide you with the care you need, when you need it.  We recommend signing up for the patient portal called "MyChart".  Sign up information is provided on this After Visit Summary.  MyChart is used to connect with patients for Virtual Visits (Telemedicine).  Patients are able to view lab/test results, encounter notes, upcoming appointments, etc.  Non-urgent messages can be sent to your provider as well.   To learn more about what you can do with MyChart,  go to [email or web address].    Your next appointment:   3 week(s)  The format for your next appointment:   In Person  Provider:    You may see [name], MD or one of the following Advanced Practice Providers on your designated Care Team:    Murray Hodgkins, NP  Christell Faith, [name]  Marrianne Mood, [name]

## 2019-07-15 DIAGNOSIS — J441 Chronic obstructive pulmonary disease with (acute) exacerbation: Secondary | ICD-10-CM | POA: Diagnosis not present

## 2019-07-15 DIAGNOSIS — I110 Hypertensive heart disease with heart failure: Secondary | ICD-10-CM | POA: Diagnosis not present

## 2019-07-15 DIAGNOSIS — E1159 Type 2 diabetes mellitus with other circulatory complications: Secondary | ICD-10-CM | POA: Diagnosis not present

## 2019-07-15 DIAGNOSIS — J9601 Acute respiratory failure with hypoxia: Secondary | ICD-10-CM | POA: Diagnosis not present

## 2019-07-15 DIAGNOSIS — I480 Paroxysmal atrial fibrillation: Secondary | ICD-10-CM | POA: Diagnosis not present

## 2019-07-15 DIAGNOSIS — A419 Sepsis, unspecified organism: Secondary | ICD-10-CM | POA: Diagnosis not present

## 2019-07-15 DIAGNOSIS — J15211 Pneumonia due to Methicillin susceptible Staphylococcus aureus: Secondary | ICD-10-CM | POA: Diagnosis not present

## 2019-07-15 DIAGNOSIS — N179 Acute kidney failure, unspecified: Secondary | ICD-10-CM | POA: Diagnosis not present

## 2019-07-15 DIAGNOSIS — I2510 Atherosclerotic heart disease of native coronary artery without angina pectoris: Secondary | ICD-10-CM | POA: Diagnosis not present

## 2019-07-15 DIAGNOSIS — Z452 Encounter for adjustment and management of vascular access device: Secondary | ICD-10-CM | POA: Diagnosis not present

## 2019-07-15 DIAGNOSIS — I5022 Chronic systolic (congestive) heart failure: Secondary | ICD-10-CM | POA: Diagnosis not present

## 2019-07-15 DIAGNOSIS — E785 Hyperlipidemia, unspecified: Secondary | ICD-10-CM | POA: Diagnosis not present

## 2019-07-15 DIAGNOSIS — J440 Chronic obstructive pulmonary disease with acute lower respiratory infection: Secondary | ICD-10-CM | POA: Diagnosis not present

## 2019-07-16 ENCOUNTER — Other Ambulatory Visit: Payer: Self-pay | Admitting: Pharmacy Technician

## 2019-07-16 ENCOUNTER — Ambulatory Visit (INDEPENDENT_AMBULATORY_CARE_PROVIDER_SITE_OTHER): Payer: Medicare Other | Admitting: Pharmacist

## 2019-07-16 DIAGNOSIS — I129 Hypertensive chronic kidney disease with stage 1 through stage 4 chronic kidney disease, or unspecified chronic kidney disease: Secondary | ICD-10-CM

## 2019-07-16 DIAGNOSIS — E785 Hyperlipidemia, unspecified: Secondary | ICD-10-CM | POA: Diagnosis not present

## 2019-07-16 DIAGNOSIS — I5022 Chronic systolic (congestive) heart failure: Secondary | ICD-10-CM | POA: Diagnosis not present

## 2019-07-16 DIAGNOSIS — J449 Chronic obstructive pulmonary disease, unspecified: Secondary | ICD-10-CM

## 2019-07-16 DIAGNOSIS — J4489 Other specified chronic obstructive pulmonary disease: Secondary | ICD-10-CM

## 2019-07-16 DIAGNOSIS — E1142 Type 2 diabetes mellitus with diabetic polyneuropathy: Secondary | ICD-10-CM | POA: Diagnosis not present

## 2019-07-16 DIAGNOSIS — J432 Centrilobular emphysema: Secondary | ICD-10-CM | POA: Diagnosis not present

## 2019-07-16 NOTE — Chronic Care Management (AMB) (Signed)
Chronic Care Management   Follow Up Note   07/16/2019 Name: Philip [name] Sr. MRN: [id number] DOB: 1945/10/13  Referred by: Philip Hauser, DO Reason for referral : Chronic Care Management (Patient Phone Call)   Philip [name] Sr. is a 74 y.o. year old male who is a primary care patient of Philip Hauser, DO. The CCM team was consulted for assistance with chronic disease management and care coordination needs.  [name] has a past medical history including but not limited to type 2 diabetes, CAD, atrial fibrillation, hypothyroidism, OSA, hyperlipidemia, hypertension, CKD, seasonal allergies, knee OA and COPD.  I reached out to Philip [name] Sr. by phone today.   Review of patient status, including review of consultants reports, relevant laboratory and other test results, and collaboration with appropriate care team members and the patient's provider was performed as part of comprehensive patient evaluation and provision of chronic care management services.   .   Outpatient Encounter Medications as of 07/16/2019  Medication Sig  . Albuterol Sulfate 108 (90 Base) MCG/ACT AEPB Inhale 2 puffs into the lungs every 6 (six) hours as needed (shortness of breath).   [name] atorvastatin (LIPITOR) 40 MG tablet Take 1 tablet by mouth once daily  . cephALEXin (KEFLEX) 500 MG capsule Take 500 mg by mouth 3 (three) times daily.  . cetirizine (ZYRTEC) 10 MG tablet Take 10 mg by mouth daily.   . diclofenac sodium (VOLTAREN) 1 % GEL Apply 2 g topically 3 (three) times daily as needed (Use on elbow bursitis and knee).  [name] ELIQUIS 5 MG TABS tablet Take 1 tablet by mouth twice daily  . Ensure Max Protein (ENSURE MAX PROTEIN) LIQD Take 330 mLs (11 oz total) by mouth 2 (two) times daily.  . fluticasone (FLONASE) 50 MCG/ACT nasal spray Place 1 spray into both nostrils daily as needed for allergies or rhinitis.  . furosemide (LASIX) 40 MG tablet TAKE 1 TABLET BY MOUTH ONCE DAILY. APPOINTMENT  NEEDED FOR FURTHER REFILLS. (Patient taking differently: Every other day)  . gabapentin (NEURONTIN) 100 MG capsule TAKE 1 CAPSULE BY MOUTH THREE TIMES DAILY  . guaiFENesin (MUCINEX) 600 MG 12 hr tablet Take 600 mg by mouth 2 (two) times daily.   [name] ipratropium-albuterol (DUONEB) 0.5-2.5 (3) MG/3ML SOLN Take 3 mLs by nebulization 4 (four) times daily.  [name] levothyroxine (SYNTHROID) 88 MCG tablet Take 1 tablet by mouth once daily  . linagliptin (TRADJENTA) 5 MG TABS tablet Take 1 tablet (5 mg total) by mouth daily.  [name] losartan (COZAAR) 25 MG tablet Take 0.5 tablets (12.5 mg total) by mouth daily.  . metFORMIN (GLUCOPHAGE) 500 MG tablet TAKE 2 TABLETS BY MOUTH TWICE DAILY WITH A MEAL (Patient taking differently: Take 1,000 mg by mouth 2 (two) times daily with a meal. )  . metoprolol tartrate 75 MG TABS Take 75 mg by mouth 2 (two) times daily.  . predniSONE (DELTASONE) 5 MG tablet 4 tabs po day1; 3 tabs po day2; 2 tabs po day3,4; 1 tab po day afterwards (Patient taking differently: Take 5 mg by mouth daily with breakfast. )  . SYMBICORT 160-4.5 MCG/ACT inhaler Inhale 2 puffs 2 (two) times daily into the lungs.   . Artificial Tear Ointment (DRY EYES OP) Place 1 drop into both eyes daily as needed (for dry eyes).  . Blood Glucose Monitoring Suppl (ONE TOUCH ULTRA 2) w/Device KIT Use to check blood sugar as advised up to twice a day  . [place]  1 Device by Does not apply route 4 (four) times daily -  before meals and at bedtime.  Glory Rosebush ULTRA test strip USE TEST STRIP(S) TO CHECK GLUCOSE TWICE DAILY  . [DISCONTINUED] nystatin (MYCOSTATIN) 100000 UNIT/ML suspension Take 5 mLs (500,000 Units total) by mouth 4 (four) times daily. (Patient not taking: Reported on 07/16/2019)  . [DISCONTINUED] sodium chloride flush (NS) 0.9 % SOLN 20 mLs by Intracatheter route every 8 (eight) hours. (Patient not taking: Reported on 07/16/2019)   No facility-administered encounter medications on file as of  07/16/2019.    Goals Addressed            This Visit's Progress   . PharmD - Medication Management       CARE PLAN ENTRY (see longitudinal plan of care for additional care plan information)  Current Barriers:  [name] Knowledge deficits related to dietary choices to improve blood sugar control . Financial - currently in coverage gap of Medicare Part D plan  Pharmacist Clinical Goal(s):  [name] Over the next 30 days, patient will work with CM Pharmacist to address needs related to medication regimen optimization.  Interventions: . Perform chart review o [name] was admitted to [facility] from 2/25 to 3/6 for septic shock, COPD exacerbation and community acquired pneumonia. - At discharge, patient instructed to START: . Cefazolin IV - now completed . Ensure Max Protein supplement . Tradjenta 5 mg daily - At discharge, patient instructed to CHANGE: . Metoprolol tartrate to 75 mg twice daily . Prednisone 5 mg taper - 4 tabs po day1; 3 tabs po day2; 2 tabs po day3,4; and then 1 tab po day afterwards o Note seen by Cardiologist on 3/15 - Note patient to complete Lexiscan stress test (scheduled) - Losartan 25 mg dose decreased to  tablet (12.5 mg) daily due to lower BP results . Comprehensive medication review performed; medication list updated in electronic medical record o Reports has completed IV cefazolin course, patient reports PICC line removed.  - Following IV cefazolin, now started taking cephalexin 500 mg three times daily for 7 days as directed by Dr. Ola [name] o Reports completed prednisone taper and currently taking prednisone 5 mg once daily as directed. - Reports that he will follow up with Dr. Ola [name] next week about whether he will continue current prednisone dose o Reports feels like he is gradually doing better/gaining strength back . Discuss blood sugar control and monitoring ? Reports currently taking ? metformin 500 mg - 2 tablets (1,000 mg) twice daily as  directed ? Tradjenta 5 mg once daily ? Review recent blood sugar results (see below) . Discuss blood pressure/ HR monitoring and control ? Reports currently taking: ? Furosemide 40 mg every other day ? Losartan 25 mg - 1/2 tablet (12.5 mg) once daily ? Metoprolol 75 mg twice daily ? Review recent BP results (see below) ? Counsel patient on importance of contacting Cardiologist for readings outside of established parameters  Counsel on importance of medication adherence  Confirms continuing to use weekly pillbox as adherence tool  Patient due for refill of losartan, confirms he will pick up from pharmacy today  Patient reports currently having ~15 day supply of Tradjenta remaining. Note Tradjenta is a tier 3 option through patient's health plan formulary.  Review medication assistance options available to patient  Will collaborate with PCP to discuss patient's diabetes medication management  Patient Self Care Activities:  . Self administers medications as prescribed . Attends all scheduled provider appointments o Next appointment with ID  provider on 3/22 o Next appointment with Cardiologist on 4/5 o Next appointment with Pulmonology on 4/12 . Calls pharmacy for medication refills . Calls provider office for new concerns or questions  . Patient to check blood sugar regularly as directed and keep log Date Fasting Blood Glucose After Supper  (~2 hours)  11 - March 198 288  12 - March 152 199  13 - March 143 193  14 - March 133 186  15 - March 143 158  16 - March 155 222  17 - March 140   Average 152 208   . Patient to keep log when checks blood pressure Date AM BP PM BP Notes  11 - March 98/65, HR 116 96/64, HR 94   12 - March 113/94, HR 103 97/69, HR 97   13 - March 96/70, HR 84 96/65, HR 91   14 - March 91/76, HR 91 90/65, HR 86   15 - March 120/83, HR 82 99/78, HR 92 *losartan dose decreased  16 - March 102/69, HR 78 102/78, HR 92   17 - March 109/73, HR 84       Please see past updates related to this goal by clicking on the "Past Updates" button in the selected goal         Plan  The care management team will reach out to the patient again over the next 3 days.   [name], PharmD, [place] [employer] [phone]

## 2019-07-16 NOTE — Patient Outreach (Signed)
[facility]) Care Management  07/16/2019  Philip ADGER Sr. 13-Jul-1945 [id number]   Care coordination call placed to [employer] in regards to patient's application for Januvia.  Spoke to [place] who informed the application has not been processed or received despite having been mailed to [employer] on 06/26/2019. She informed to try back in a few weeks.  Will follow up with [employer] in 10-14 business days.  Palmina Clodfelter P. Jaquez Farrington, [place]  [phone]

## 2019-07-17 NOTE — Patient Instructions (Signed)
Thank you allowing the Chronic Care Management Team to be a part of your care! It was a pleasure speaking with you today!     CCM (Chronic Care Management) Team    Noreene Larsson RN, MSN, CCM Nurse Care Coordinator  [phone]   [name] PharmD  Clinical Pharmacist  [phone]   Eula Fried LCSW Clinical Social Worker [phone]  Visit Information  Goals Addressed            This Visit's Progress   . PharmD - Medication Management       CARE PLAN ENTRY (see longitudinal plan of care for additional care plan information)  Current Barriers:  [name] Knowledge deficits related to dietary choices to improve blood sugar control . Financial - currently in coverage gap of Medicare Part D plan  Pharmacist Clinical Goal(s):  [name] Over the next 30 days, patient will work with CM Pharmacist to address needs related to medication regimen optimization.  Interventions: . Perform chart review o Mr. Ryals was admitted to [facility] from 2/25 to 3/6 for septic shock, COPD exacerbation and community acquired pneumonia. - At discharge, patient instructed to START: . Cefazolin IV - now completed . Ensure Max Protein supplement . Tradjenta 5 mg daily - At discharge, patient instructed to CHANGE: . Metoprolol tartrate to 75 mg twice daily . Prednisone 5 mg taper - 4 tabs po day1; 3 tabs po day2; 2 tabs po day3,4; and then 1 tab po day afterwards o Note seen by Cardiologist on 3/15 - Note patient to complete Lexiscan stress test (scheduled) - Losartan 25 mg dose decreased to  tablet (12.5 mg) daily due to lower BP results . Comprehensive medication review performed; medication list updated in electronic medical record o Reports has completed IV cefazolin course, patient reports PICC line removed.  - Following IV cefazolin, now started taking cephalexin 500 mg three times daily for 7 days as directed by Dr. Ola Spurr o Reports completed prednisone taper and currently taking prednisone 5  mg once daily as directed. - Reports that he will follow up with Dr. Ola Spurr next week about whether he will continue current prednisone dose o Reports feels like he is gradually doing better/gaining strength back . Discuss blood sugar control and monitoring ? Reports currently taking ? metformin 500 mg - 2 tablets (1,000 mg) twice daily as directed ? Tradjenta 5 mg once daily ? Review recent blood sugar results (see below) . Discuss blood pressure/ HR monitoring and control ? Reports currently taking: ? Furosemide 40 mg every other day ? Losartan 25 mg - 1/2 tablet (12.5 mg) once daily ? Metoprolol 75 mg twice daily ? Review recent BP results (see below) ? Counsel patient on importance of contacting Cardiologist for readings outside of established parameters  Counsel on importance of medication adherence  Confirms continuing to use weekly pillbox as adherence tool  Patient due for refill of losartan, confirms he will pick up from pharmacy today  Patient reports currently having ~15 day supply of Tradjenta remaining. Note Tradjenta is a tier 3 option through patient's health plan formulary.  Review medication assistance options available to patient  Will collaborate with PCP to discuss patient's diabetes medication management  Patient Self Care Activities:  . Self administers medications as prescribed . Attends all scheduled provider appointments o Next appointment with ID provider on 3/22 o Next appointment with Cardiologist on 4/5 o Next appointment with Pulmonology on 4/12 . Calls pharmacy for medication refills . Calls provider office for  new concerns or questions  . Patient to check blood sugar regularly as directed and keep log Date Fasting Blood Glucose After Supper  (~2 hours)  11 - March 198 288  12 - March 152 199  13 - March 143 193  14 - March 133 186  15 - March 143 158  16 - March 155 222  17 - March 140   Average 152 208   . Patient to keep log when  checks blood pressure Date AM BP PM BP Notes  11 - March 98/65, HR 116 96/64, HR 94   12 - March 113/94, HR 103 97/69, HR 97   13 - March 96/70, HR 84 96/65, HR 91   14 - March 91/76, HR 91 90/65, HR 86   15 - March 120/83, HR 82 99/78, HR 92 *losartan dose decreased  16 - March 102/69, HR 78 102/78, HR 92   17 - March 109/73, HR 84      Please see past updates related to this goal by clicking on the "Past Updates" button in the selected goal         Patient verbalizes understanding of instructions provided today.   The care management team will reach out to the patient again over the next 3 days.   [name], PharmD, [place] [employer] [phone]

## 2019-07-18 ENCOUNTER — Ambulatory Visit: Payer: Self-pay | Admitting: Pharmacist

## 2019-07-18 DIAGNOSIS — E1142 Type 2 diabetes mellitus with diabetic polyneuropathy: Secondary | ICD-10-CM

## 2019-07-18 DIAGNOSIS — I5022 Chronic systolic (congestive) heart failure: Secondary | ICD-10-CM

## 2019-07-18 NOTE — Patient Instructions (Signed)
Thank you allowing the Chronic Care Management Team to be a part of your care! It was a pleasure speaking with you today!     CCM (Chronic Care Management) Team    Noreene Larsson RN, MSN, CCM Nurse Care Coordinator  [phone]   [name] PharmD  Clinical Pharmacist  [phone]   Eula Fried LCSW Clinical Social Worker [phone]  Visit Information  Goals Addressed            This Visit's Progress   . PharmD - Medication Management       CARE PLAN ENTRY (see longitudinal plan of care for additional care plan information)  Current Barriers:  [name] Knowledge deficits related to dietary choices to improve blood sugar control . Financial - currently in coverage gap of Medicare Part D plan  Pharmacist Clinical Goal(s):  [name] Over the next 30 days, patient will work with CM Pharmacist to address needs related to medication regimen optimization.  Interventions:  Collaborated with PCP to discuss patient's diabetes medication management  Recommend switch from DPP-4 inhibitor (Tradjenta) to SGLT-2 inhibitor, [name]. Provider agrees with plan for addition of cardioprotective and CKD benefits   Note from discssion with patient, he is now meets eligility requirements for [facility] patient assistance program.  Send coordination of care InBasket message to Cardiology team regarding this medication change. Note given diuretic effect of SGLT-2 inhibitors monitoring for hypotension needed and often requires dose adjustment of diuretcs.  F/u with Mr. Fredie Ranard patient on COVID-19 prevention and vaccination.   Reports planning to schedule COVID-19 vaccination and has needed contact information  Patient Self Care Activities:  . Self administers medications as prescribed . Attends all scheduled provider appointments o Next appointment with ID provider on 3/22 o Next appointment with Cardiologist on 4/5 o Next appointment with Pulmonology on 4/12 . Calls pharmacy for  medication refills . Calls provider office for new concerns or questions  . Patient to check blood sugar regularly as directed and keep log . Patient to keep log when checks blood pressure   Please see past updates related to this goal by clicking on the "Past Updates" button in the selected goal         Patient verbalizes understanding of instructions provided today.   The care management team will reach out to the patient again over the next 14 days.   [name], PharmD, [place] [employer] [phone]

## 2019-07-18 NOTE — Chronic Care Management (AMB) (Signed)
Chronic Care Management   Follow Up Note   07/18/2019 Name: Philip GETTER Sr. MRN: [id number] DOB: Sep 01, 1945  Referred by: Philip Hauser, DO Reason for referral : Chronic Care Management (Patient Phone Call) and [place] Sr. is a 74 y.o. year old male who is a primary care patient of Philip Hauser, DO. The CCM team was consulted for assistance with chronic disease management and care coordination needs.  Mr. Catino has a past medical history including but not limited to type 2 diabetes, CAD, atrial fibrillation, CHF, hypothyroidism, OSA, hyperlipidemia, hypertension, CKD, seasonal allergies, knee OA and COPD.  I reached out to Philip [name] Sr. by phone today.   Review of patient status, including review of consultants reports, relevant laboratory and other test results, and collaboration with appropriate care team members and the patient's provider was performed as part of comprehensive patient evaluation and provision of chronic care management services.      Outpatient Encounter Medications as of 07/18/2019  Medication Sig  . Albuterol Sulfate 108 (90 Base) MCG/ACT AEPB Inhale 2 puffs into the lungs every 6 (six) hours as needed (shortness of breath).   . Artificial Tear Ointment (DRY EYES OP) Place 1 drop into both eyes daily as needed (for dry eyes).  [name] atorvastatin (LIPITOR) 40 MG tablet Take 1 tablet by mouth once daily  . Blood Glucose Monitoring Suppl (ONE TOUCH ULTRA 2) w/Device KIT Use to check blood sugar as advised up to twice a day  . cephALEXin (KEFLEX) 500 MG capsule Take 500 mg by mouth 3 (three) times daily.  . cetirizine (ZYRTEC) 10 MG tablet Take 10 mg by mouth daily.   . diclofenac sodium (VOLTAREN) 1 % GEL Apply 2 g topically 3 (three) times daily as needed (Use on elbow bursitis and knee).  [name] ELIQUIS 5 MG TABS tablet Take 1 tablet by mouth twice daily  . Ensure Max Protein (ENSURE MAX PROTEIN) LIQD Take 330 mLs (11 oz  total) by mouth 2 (two) times daily.  . fluticasone (FLONASE) 50 MCG/ACT nasal spray Place 1 spray into both nostrils daily as needed for allergies or rhinitis.  . furosemide (LASIX) 40 MG tablet TAKE 1 TABLET BY MOUTH ONCE DAILY. APPOINTMENT NEEDED FOR FURTHER REFILLS. (Patient taking differently: Every other day)  . gabapentin (NEURONTIN) 100 MG capsule TAKE 1 CAPSULE BY MOUTH THREE TIMES DAILY  . guaiFENesin (MUCINEX) 600 MG 12 hr tablet Take 600 mg by mouth 2 (two) times daily.   [name] ipratropium-albuterol (DUONEB) 0.5-2.5 (3) MG/3ML SOLN Take 3 mLs by nebulization 4 (four) times daily.  [name] LANCETS ULTRA THIN MISC 1 Device by Does not apply route 4 (four) times daily -  before meals and at bedtime.  [name] levothyroxine (SYNTHROID) 88 MCG tablet Take 1 tablet by mouth once daily  . linagliptin (TRADJENTA) 5 MG TABS tablet Take 1 tablet (5 mg total) by mouth daily.  [name] losartan (COZAAR) 25 MG tablet Take 0.5 tablets (12.5 mg total) by mouth daily.  . metFORMIN (GLUCOPHAGE) 500 MG tablet TAKE 2 TABLETS BY MOUTH TWICE DAILY WITH A MEAL (Patient taking differently: Take 1,000 mg by mouth 2 (two) times daily with a meal. )  . metoprolol tartrate 75 MG TABS Take 75 mg by mouth 2 (two) times daily.  Glory Rosebush ULTRA test strip USE TEST STRIP(S) TO CHECK GLUCOSE TWICE DAILY  . predniSONE (DELTASONE) 5 MG tablet 4 tabs po day1; 3 tabs po day2; 2 tabs  po day3,4; 1 tab po day afterwards (Patient taking differently: Take 5 mg by mouth daily with breakfast. )  . SYMBICORT 160-4.5 MCG/ACT inhaler Inhale 2 puffs 2 (two) times daily into the lungs.    No facility-administered encounter medications on file as of 07/18/2019.    Goals Addressed            This Visit's Progress   . PharmD - Medication Management       CARE PLAN ENTRY (see longitudinal plan of care for additional care plan information)  Current Barriers:  [name] Knowledge deficits related to dietary choices to improve blood sugar control . Financial -  currently in coverage gap of Medicare Part D plan  Pharmacist Clinical Goal(s):  [name] Over the next 30 days, patient will work with CM Pharmacist to address needs related to medication regimen optimization.  Interventions:  Collaborated with PCP to discuss patient's diabetes medication management  Recommend switch from DPP-4 inhibitor (Tradjenta) to SGLT-2 inhibitor, [name]. Provider agrees with plan for addition of cardioprotective and CKD benefits   Note from discssion with patient, he is now meets eligility requirements for [facility] patient assistance program.  Send coordination of care InBasket message to Cardiology team regarding this medication change. Note given diuretic effect of SGLT-2 inhibitors monitoring for hypotension needed and often requires dose adjustment of diuretcs.  F/u with [name] patient on COVID-19 prevention and vaccination.   Reports planning to schedule COVID-19 vaccination and has needed contact information  Patient Self Care Activities:  . Self administers medications as prescribed . Attends all scheduled provider appointments o Next appointment with ID provider on 3/22 o Next appointment with Cardiologist on 4/5 o Next appointment with Pulmonology on 4/12 . Calls pharmacy for medication refills . Calls provider office for new concerns or questions  . Patient to check blood sugar regularly as directed and keep log . Patient to keep log when checks blood pressure   Please see past updates related to this goal by clicking on the "Past Updates" button in the selected goal         Plan  The care management team will reach out to the patient again over the next 14 days.   Philip [name], PharmD, [place] [employer] [phone]

## 2019-07-21 ENCOUNTER — Ambulatory Visit: Payer: Self-pay | Admitting: Pharmacist

## 2019-07-21 ENCOUNTER — Other Ambulatory Visit: Payer: Self-pay | Admitting: Family Medicine

## 2019-07-21 DIAGNOSIS — E785 Hyperlipidemia, unspecified: Secondary | ICD-10-CM | POA: Diagnosis not present

## 2019-07-21 DIAGNOSIS — J432 Centrilobular emphysema: Secondary | ICD-10-CM | POA: Diagnosis not present

## 2019-07-21 DIAGNOSIS — N182 Chronic kidney disease, stage 2 (mild): Secondary | ICD-10-CM

## 2019-07-21 DIAGNOSIS — E1142 Type 2 diabetes mellitus with diabetic polyneuropathy: Secondary | ICD-10-CM

## 2019-07-21 DIAGNOSIS — E1169 Type 2 diabetes mellitus with other specified complication: Secondary | ICD-10-CM

## 2019-07-21 DIAGNOSIS — I5022 Chronic systolic (congestive) heart failure: Secondary | ICD-10-CM

## 2019-07-21 DIAGNOSIS — J90 Pleural effusion, not elsewhere classified: Secondary | ICD-10-CM | POA: Diagnosis not present

## 2019-07-21 DIAGNOSIS — I129 Hypertensive chronic kidney disease with stage 1 through stage 4 chronic kidney disease, or unspecified chronic kidney disease: Secondary | ICD-10-CM

## 2019-07-21 DIAGNOSIS — J449 Chronic obstructive pulmonary disease, unspecified: Secondary | ICD-10-CM | POA: Diagnosis not present

## 2019-07-21 DIAGNOSIS — Z87891 Personal history of nicotine dependence: Secondary | ICD-10-CM | POA: Diagnosis not present

## 2019-07-21 MED ORDER — FARXIGA 5 MG PO TABS: 5.0000 mg | ORAL_TABLET | Freq: Every day | ORAL | 3 refills | Status: DC

## 2019-07-21 NOTE — Patient Instructions (Signed)
Thank you allowing the Chronic Care Management Team to be a part of your care! It was a pleasure speaking with you today!     CCM (Chronic Care Management) Team    Noreene Larsson RN, MSN, CCM Nurse Care Coordinator  [phone]   [name] PharmD  Clinical Pharmacist  [phone]   Eula Fried LCSW Clinical Social Worker [phone]  Visit Information  Goals Addressed            This Visit's Progress   . PharmD - Medication Management       CARE PLAN ENTRY (see longitudinal plan of care for additional care plan information)  Current Barriers:  [name] Knowledge deficits related to dietary choices to improve blood sugar control . Financial - currently in coverage gap of Medicare Part D plan  Pharmacist Clinical Goal(s):  [name] Over the next 30 days, patient will work with CM Pharmacist to address needs related to medication regimen optimization.  Interventions:  Sent coordination of care InBasket message to Cardiology team regarding plan with PCP to switch patient from [place] to [place] for addition of cardioprotective and CKD benefits. Note given diuretic effect of SGLT-2 inhibitors monitoring for hypotension needed and often requires dose adjustment of diuretcs.  Receive following reply from [place]:  "We recently down titrated his Losartan dosage due to soft blood pressures. I would say it is ok to start but keep close eye on BP and renal function. He takes Lasix 40 mg every other day. It may need to be decreased after starting [place].  Will need a BMET after initiation. Tell the patient to keep a close eye on his BP. If SBP decreases to below 100 , he will need to decrease the lasix dose to 20 mg every other day."  Follow up with patient regarding diabetes medication management   Patient would like to finish current supply of Tradjenta (15 day supply remaining) prior to starting on Farxiga  Review above instructions from Mr. Leonides Sake with patient.    Mr. Fahrer confirms continuing to monitor his daily weights as directed  Confirms checking BP/HR twice daily (see below)  Confirms that he will f/u with Cardiology following parameters from provider  Remind patient to bring BP log and daily weight log with him to upcoming Cardiology appointment  Will collaborate with PCP  Will collaborate with Cardiology team and [facility]  Will continue to collaborate with [place] regarding assisting patient with medication assistance applications  Note previously started application for assistance with Januvia through [employer]  Patient verbalizes understanding of plan to discontinue applying for this program as patient no longer taking Januvia  Patient reports now meeting income criteria for [place] patient assistance program through [place]  Patient Self Care Activities:  . Self administers medications as prescribed . Attends all scheduled provider appointments o Next appointment with Cardiologist on 4/5 o Next appointment with Pulmonology on 4/12 . Calls pharmacy for medication refills . Calls provider office for new concerns or questions  . Patient to check blood sugar regularly as directed and keep log . Patient to keep log when checks blood pressure Date AM BP PM BP  19 - March 104/71, HR 87 100/66, HR 90  20 - March 109/82, HR 90 109/61, HR 91  21 - March 107/70, HR 87 95/66, HR 85  22 - March 105/74, HR 82     Please see past updates related to this goal by clicking on the "Past Updates" button in the  selected goal         Patient verbalizes understanding of instructions provided today.   Telephone follow up appointment with care management team member scheduled for: 4/5 at 9:30 am  [name], PharmD, [place] [phone]

## 2019-07-21 NOTE — Chronic Care Management (AMB) (Signed)
Chronic Care Management   Follow Up Note   07/21/2019 Name: Philip LLERENA Sr. MRN: [id number] DOB: Jan 29, 1946  Referred by: [name], DO Reason for referral : Chronic Care Management (Patient Phone Call)   Philip Potters Sr. is a 74 y.o. year old male who is a primary care patient of [name], DO. The CCM team was consulted for assistance with chronic disease management and care coordination needs.  Philip [name] has a past medical history including but not limited to type 2 diabetes, CAD, atrial fibrillation, CHF, hypothyroidism, OSA, hyperlipidemia, hypertension, CKD, seasonal allergies, knee OA and COPD.  Collaborated with [facility]  I reached out to [name] by phone today.   Review of patient status, including review of consultants reports, relevant laboratory and other test results, and collaboration with appropriate care team members and the patient's provider was performed as part of comprehensive patient evaluation and provision of chronic care management services.     Outpatient Encounter Medications as of 07/21/2019  Medication Sig  . linagliptin (TRADJENTA) 5 MG TABS tablet Take 1 tablet (5 mg total) by mouth daily.  . Albuterol Sulfate 108 (90 Base) MCG/ACT AEPB Inhale 2 puffs into the lungs every 6 (six) hours as needed (shortness of breath).   . Artificial Tear Ointment (DRY EYES OP) Place 1 drop into both eyes daily as needed (for dry eyes).  [name] atorvastatin (LIPITOR) 40 MG tablet Take 1 tablet by mouth once daily  . Blood Glucose Monitoring Suppl (ONE TOUCH ULTRA 2) w/Device KIT Use to check blood sugar as advised up to twice a day  . cephALEXin (KEFLEX) 500 MG capsule Take 500 mg by mouth 3 (three) times daily.  . cetirizine (ZYRTEC) 10 MG tablet Take 10 mg by mouth daily.   . diclofenac sodium (VOLTAREN) 1 % GEL Apply 2 g topically 3 (three) times daily as needed (Use on elbow bursitis and knee).  [name] ELIQUIS 5 MG TABS tablet  Take 1 tablet by mouth twice daily  . Ensure Max Protein (ENSURE MAX PROTEIN) LIQD Take 330 mLs (11 oz total) by mouth 2 (two) times daily.  . fluticasone (FLONASE) 50 MCG/ACT nasal spray Place 1 spray into both nostrils daily as needed for allergies or rhinitis.  . furosemide (LASIX) 40 MG tablet TAKE 1 TABLET BY MOUTH ONCE DAILY. APPOINTMENT NEEDED FOR FURTHER REFILLS. (Patient taking differently: Every other day)  . gabapentin (NEURONTIN) 100 MG capsule TAKE 1 CAPSULE BY MOUTH THREE TIMES DAILY  . guaiFENesin (MUCINEX) 600 MG 12 hr tablet Take 600 mg by mouth 2 (two) times daily.   [name] ipratropium-albuterol (DUONEB) 0.5-2.5 (3) MG/3ML SOLN Take 3 mLs by nebulization 4 (four) times daily.  [name] LANCETS ULTRA THIN MISC 1 Device by Does not apply route 4 (four) times daily -  before meals and at bedtime.  [name] levothyroxine (SYNTHROID) 88 MCG tablet Take 1 tablet by mouth once daily  . losartan (COZAAR) 25 MG tablet Take 0.5 tablets (12.5 mg total) by mouth daily.  . metFORMIN (GLUCOPHAGE) 500 MG tablet TAKE 2 TABLETS BY MOUTH TWICE DAILY WITH A MEAL (Patient taking differently: Take 1,000 mg by mouth 2 (two) times daily with a meal. )  . metoprolol tartrate 75 MG TABS Take 75 mg by mouth 2 (two) times daily.  Glory Rosebush ULTRA test strip USE TEST STRIP(S) TO CHECK GLUCOSE TWICE DAILY  . predniSONE (DELTASONE) 5 MG tablet 4 tabs po day1; 3 tabs po day2; 2  tabs po day3,4; 1 tab po day afterwards (Patient taking differently: Take 5 mg by mouth daily with breakfast. )  . SYMBICORT 160-4.5 MCG/ACT inhaler Inhale 2 puffs 2 (two) times daily into the lungs.    No facility-administered encounter medications on file as of 07/21/2019.    Goals Addressed            This Visit's Progress   . PharmD - Medication Management       CARE PLAN ENTRY (see longitudinal plan of care for additional care plan information)  Current Barriers:  [name] Knowledge deficits related to dietary choices to improve blood sugar  control . Financial - currently in coverage gap of Medicare Part D plan  Pharmacist Clinical Goal(s):  [name] Over the next 30 days, patient will work with CM Pharmacist to address needs related to medication regimen optimization.  Interventions:  Sent coordination of care InBasket message to Cardiology team regarding plan with PCP to switch patient from [place] to [place] for addition of cardioprotective and CKD benefits. Note given diuretic effect of SGLT-2 inhibitors monitoring for hypotension needed and often requires dose adjustment of diuretcs.  Receive following reply from [place]:  "We recently down titrated his Losartan dosage due to soft blood pressures. I would say it is ok to start but keep close eye on BP and renal function. He takes Lasix 40 mg every other day. It may need to be decreased after starting [place].  Will need a BMET after initiation. Tell the patient to keep a close eye on his BP. If SBP decreases to below 100 , he will need to decrease the lasix dose to 20 mg every other day."  Follow up with patient regarding diabetes medication management   Patient would like to finish current supply of Tradjenta (15 day supply remaining) prior to starting on Farxiga  Review above instructions from Philip [name] with patient.   Philip [name] confirms continuing to monitor his daily weights as directed  Confirms checking BP/HR twice daily (see below)  Confirms that he will f/u with Cardiology following parameters from provider  Remind patient to bring BP log and daily weight log with him to upcoming Cardiology appointment  Will collaborate with PCP  Will collaborate with Cardiology team and [facility]  Will continue to collaborate with [place] regarding assisting patient with medication assistance applications  Note previously started application for assistance with Januvia through [employer]  Patient verbalizes understanding of plan to discontinue applying for  this program as patient no longer taking Januvia  Patient reports now meeting income criteria for [place] patient assistance program through [place]  Patient Self Care Activities:  . Self administers medications as prescribed . Attends all scheduled provider appointments o Next appointment with Cardiologist on 4/5 o Next appointment with Pulmonology on 4/12 . Calls pharmacy for medication refills . Calls provider office for new concerns or questions  . Patient to check blood sugar regularly as directed and keep log . Patient to keep log when checks blood pressure Date AM BP PM BP  19 - March 104/71, HR 87 100/66, HR 90  20 - March 109/82, HR 90 109/61, HR 91  21 - March 107/70, HR 87 95/66, HR 85  22 - March 105/74, HR 82     Please see past updates related to this goal by clicking on the "Past Updates" button in the selected goal         Plan  Telephone follow up appointment with care  management team member scheduled for: 4/5 at 9:30 am  [name], PharmD, [place] [phone]

## 2019-07-22 ENCOUNTER — Other Ambulatory Visit: Payer: Self-pay | Admitting: Pharmacy Technician

## 2019-07-22 NOTE — Patient Outreach (Signed)
[facility]) Care Management  07/22/2019  SOU AHMAD Sr. [birth year] [id number]                                       Medication Assistance Referral  Referral From: [place]   Medication/Company: [name] / [place] Patient application portion:  Mailed Provider application portion: Faxed  to [name], DO Provider address/fax verified via: Office website     Follow up:  Will follow up with patient in 10-14 business days to confirm application(s) have been received.  Lashawne Dura P. Lorelle Macaluso, [place]  [phone]

## 2019-07-23 ENCOUNTER — Ambulatory Visit: Payer: Self-pay | Admitting: Pharmacist

## 2019-07-23 DIAGNOSIS — E1169 Type 2 diabetes mellitus with other specified complication: Secondary | ICD-10-CM

## 2019-07-23 DIAGNOSIS — E785 Hyperlipidemia, unspecified: Secondary | ICD-10-CM

## 2019-07-23 NOTE — Patient Instructions (Signed)
Thank you allowing the Chronic Care Management Team to be a part of your care! It was a pleasure speaking with you today!     CCM (Chronic Care Management) Team    Noreene Larsson RN, MSN, CCM Nurse Care Coordinator  [phone]   [name] PharmD  Clinical Pharmacist  [phone]   Eula Fried LCSW Clinical Social Worker [phone]  Visit Information  Goals Addressed            This Visit's Progress   . PharmD - Medication Management       CARE PLAN ENTRY (see longitudinal plan of care for additional care plan information)  Current Barriers:  [name] Knowledge deficits related to dietary choices to improve blood sugar control . Financial - currently in coverage gap of Medicare Part D plan  Pharmacist Clinical Goal(s):  [name] Over the next 30 days, patient will work with CM Pharmacist to address needs related to medication regimen optimization.  Interventions:  Receive coordination of care secure message from Debby Bud, [place] with office letting me know that patient has a question about [place] Rx.  Mr. Winborn reports that he picked up Rx for Farxiga from pharmacy today. Patient wants to confirm when to start this medication. Again states that he would like to finish up remainder of Rx for Tradjenta 5 mg once daily.   Patient verbalizes understanding via teach back method that he will start taking Farxiga 5 mg QAM as directed after he has finished supply of Tradjenta.  Denies further medication questions/concerns at this time.  Patient Self Care Activities:  . Self administers medications as prescribed . Attends all scheduled provider appointments o Next appointment with Cardiologist on 4/5 o Next appointment with Pulmonology on 4/12 . Calls pharmacy for medication refills . Calls provider office for new concerns or questions  . Patient to check blood sugar regularly as directed and keep log . Patient to keep log when checks blood pressure   Please see past  updates related to this goal by clicking on the "Past Updates" button in the selected goal         Patient verbalizes understanding of instructions provided today.   Telephone follow up appointment with care management team member scheduled for: 4/5  [name], PharmD, [place] [phone]

## 2019-07-23 NOTE — Chronic Care Management (AMB) (Signed)
Chronic Care Management   Follow Up Note   07/23/2019 Name: Philip LALOR Sr. MRN: [id number] DOB: [birth year]  Referred by: [name], DO Reason for referral : Chronic Care Management (Patient Phone Call)   Philip [name] Sr. is a 74 y.o. year old male who is a primary care patient of [name], DO. The CCM team was consulted for assistance with chronic disease management and care coordination needs. Philip [name] has a past medical history including but not limited to type 2 diabetes, CAD, atrial fibrillation, CHF, hypothyroidism, OSA, hyperlipidemia, hypertension, CKD, seasonal allergies, knee OA and COPD.   Receive coordination of care message from Debby Bud, [place] with office letting me know that patient has a question about [place] Rx.  I reached out to Philip [name] Sr. by phone today.    Review of patient status, including review of consultants reports, relevant laboratory and other test results, and collaboration with appropriate care team members and the patient's provider was performed as part of comprehensive patient evaluation and provision of chronic care management services.     Outpatient Encounter Medications as of 07/23/2019  Medication Sig Note  . Albuterol Sulfate 108 (90 Base) MCG/ACT AEPB Inhale 2 puffs into the lungs every 6 (six) hours as needed (shortness of breath).    . Artificial Tear Ointment (DRY EYES OP) Place 1 drop into both eyes daily as needed (for dry eyes).   [name] atorvastatin (LIPITOR) 40 MG tablet Take 1 tablet by mouth once daily   . Blood Glucose Monitoring Suppl (ONE TOUCH ULTRA 2) w/Device KIT Use to check blood sugar as advised up to twice a day   . cephALEXin (KEFLEX) 500 MG capsule Take 500 mg by mouth 3 (three) times daily.   . cetirizine (ZYRTEC) 10 MG tablet Take 10 mg by mouth daily.    . dapagliflozin propanediol (FARXIGA) 5 MG TABS tablet Take 5 mg by mouth daily before breakfast. (Patient not taking: Reported on  07/23/2019) 07/23/2019: Planning to start taking after finished supply of Tradjenta  . diclofenac sodium (VOLTAREN) 1 % GEL Apply 2 g topically 3 (three) times daily as needed (Use on elbow bursitis and knee).   [name] ELIQUIS 5 MG TABS tablet Take 1 tablet by mouth twice daily   . Ensure Max Protein (ENSURE MAX PROTEIN) LIQD Take 330 mLs (11 oz total) by mouth 2 (two) times daily.   . fluticasone (FLONASE) 50 MCG/ACT nasal spray Place 1 spray into both nostrils daily as needed for allergies or rhinitis.   . furosemide (LASIX) 40 MG tablet TAKE 1 TABLET BY MOUTH ONCE DAILY. APPOINTMENT NEEDED FOR FURTHER REFILLS. (Patient taking differently: Every other day)   . gabapentin (NEURONTIN) 100 MG capsule TAKE 1 CAPSULE BY MOUTH THREE TIMES DAILY   . guaiFENesin (MUCINEX) 600 MG 12 hr tablet Take 600 mg by mouth 2 (two) times daily.    [name] ipratropium-albuterol (DUONEB) 0.5-2.5 (3) MG/3ML SOLN Take 3 mLs by nebulization 4 (four) times daily.   [name] LANCETS ULTRA THIN MISC 1 Device by Does not apply route 4 (four) times daily -  before meals and at bedtime.   [name] levothyroxine (SYNTHROID) 88 MCG tablet Take 1 tablet by mouth once daily   . losartan (COZAAR) 25 MG tablet Take 0.5 tablets (12.5 mg total) by mouth daily.   . metFORMIN (GLUCOPHAGE) 500 MG tablet TAKE 2 TABLETS BY MOUTH TWICE DAILY WITH A MEAL (Patient taking differently: Take 1,000 mg by mouth 2 (  two) times daily with a meal. )   . metoprolol tartrate 75 MG TABS Take 75 mg by mouth 2 (two) times daily.   Glory Rosebush ULTRA test strip USE TEST STRIP(S) TO CHECK GLUCOSE TWICE DAILY   . predniSONE (DELTASONE) 5 MG tablet 4 tabs po day1; 3 tabs po day2; 2 tabs po day3,4; 1 tab po day afterwards (Patient taking differently: Take 5 mg by mouth daily with breakfast. )   . SYMBICORT 160-4.5 MCG/ACT inhaler Inhale 2 puffs 2 (two) times daily into the lungs.     No facility-administered encounter medications on file as of 07/23/2019.    Goals Addressed              This Visit's Progress   . PharmD - Medication Management       CARE PLAN ENTRY (see longitudinal plan of care for additional care plan information)  Current Barriers:  [name] Knowledge deficits related to dietary choices to improve blood sugar control . Financial - currently in coverage gap of Medicare Part D plan  Pharmacist Clinical Goal(s):  [name] Over the next 30 days, patient will work with CM Pharmacist to address needs related to medication regimen optimization.  Interventions:  Receive coordination of care secure message from Debby Bud, [place] with office letting me know that patient has a question about [place] Rx.  [name] reports that he picked up Rx for Farxiga from pharmacy today. Patient wants to confirm when to start this medication. Again states that he would like to finish up remainder of Rx for Tradjenta 5 mg once daily.   Patient verbalizes understanding via teach back method that he will start taking Farxiga 5 mg QAM as directed after he has finished supply of Tradjenta.  Denies further medication questions/concerns at this time.  Patient Self Care Activities:  . Self administers medications as prescribed . Attends all scheduled provider appointments o Next appointment with Cardiologist on 4/5 o Next appointment with Pulmonology on 4/12 . Calls pharmacy for medication refills . Calls provider office for new concerns or questions  . Patient to check blood sugar regularly as directed and keep log . Patient to keep log when checks blood pressure   Please see past updates related to this goal by clicking on the "Past Updates" button in the selected goal         Plan Telephone follow up appointment with care management team member scheduled for: 4/5  [name], PharmD, [place] [phone]

## 2019-07-28 ENCOUNTER — Encounter
Admission: RE | Admit: 2019-07-28 | Discharge: 2019-07-28 | Disposition: A | Discharge status: Home / Self Care | Payer: Medicare Other | Source: Ambulatory Visit | Attending: Family | Admitting: Family

## 2019-07-28 ENCOUNTER — Other Ambulatory Visit: Payer: Self-pay

## 2019-07-28 DIAGNOSIS — R0602 Shortness of breath: Secondary | ICD-10-CM | POA: Insufficient documentation

## 2019-07-28 LAB — NM MYOCAR MULTI W/SPECT W/WALL MOTION / EF
Estimated workload: 1 METS
Exercise duration (min): 0 min
Exercise duration (sec): 0 s
LV dias vol: 49 mL (ref 62–150)
LV sys vol: 21 mL
MPHR: 148 {beats}/min
Peak HR: 105 {beats}/min
Percent HR: 70 %
Rest HR: 86 {beats}/min
SDS: 0
SRS: 5
SSS: 2
TID: 1.02

## 2019-07-28 MED ORDER — TECHNETIUM TC 99M TETROFOSMIN IV KIT
10.5000 | PACK | Freq: Once | INTRAVENOUS | Status: AC | PRN
  Administered 2019-07-28: 10.5 via INTRAVENOUS

## 2019-07-28 MED ORDER — REGADENOSON 0.4 MG/5ML IV SOLN
0.4000 mg | Freq: Once | INTRAVENOUS | Status: AC
  Administered 2019-07-28: 0.4 mg via INTRAVENOUS

## 2019-07-28 MED ORDER — TECHNETIUM TC 99M TETROFOSMIN IV KIT
32.5300 | PACK | Freq: Once | INTRAVENOUS | Status: AC | PRN
  Administered 2019-07-28: 32.53 via INTRAVENOUS

## 2019-07-30 ENCOUNTER — Telehealth: Payer: Self-pay

## 2019-07-31 ENCOUNTER — Other Ambulatory Visit: Payer: Self-pay | Admitting: Pharmacy Technician

## 2019-07-31 NOTE — Patient Outreach (Signed)
[facility] [facility]) Care Management  07/31/2019  ADRI LEGNER Sr. 1945-06-15 [id number]    Received both patient and provider portion(s) of patient assistance application(s) for Farxiga. Faxed completed application and required documents into [employer].  Will follow up with company(ies) in 10-15 business days to check status of application(s).  Donne Baley P. Saesha Llerenas, [place]  [phone]

## 2019-08-01 NOTE — Progress Notes (Signed)
Cardiology Office Note    Date:  08/04/2019   ID:  Philip Potters Sr., DOB 10/08/1945, MRN [id number]  PCP:  [name], DO  Cardiologist:  [name], MD  Electrophysiologist:  None   Chief Complaint: Follow up  History of Present Illness:   Philip Potters Sr. is a 74 y.o. male with history of CAD status post three-vessel CABG in 05/2012 with LIMA to LAD, SVG to LCx, and SVG to RPDA, HFrEF, persistent A. fib on Eliquis, ectopic atrial tachycardia, diastolic dysfunction, DM2, HTN, HLD, and COPD who presents for follow-up of his CAD, cardiomyopathy, and A. fib.  Cardiac cath on 05/2012 showed severe three-vessel CAD with recommendation for CABG as outlined above.  Most recent Myoview from 06/2014 without evidence of ischemia or infarct.  Echo from 06/2015 showed a low normal EF of 50 to 63%, grade 1 diastolic dysfunction, mild mitral regurgitation, mildly dilated left atrium, normal RV systolic function, and normal PASP.  He has been managed on chronic Eliquis in the setting of his prior atrial arrhythmias and was previously on amiodarone, though this was discontinued in 11/2016 secondary to concern for related side effects.  He has previously not been interested in catheter ablation for ectopic atrial tachycardia and has been managed with medical therapy.  He was admitted to the hospital in late 06/2019 with sepsis secondary to pneumonia and MSSA bacteremia complicated by A. fib with RVR.  2D surface echo on 06/29/2019 showed an EF of 40 to 45%, global hypokinesis, moderately dilated left ventricle, mildly reduced RV systolic function with mildly large RV cavity size, moderately elevated PASP, mild biatrial enlargement, mild to moderate mitral regurgitation.  TEE on 07/04/2019 showed an EF of 30 to 35%, global hypokinesis, mildly reduced RV systolic function with mildly large RV cavity size, no left atrial appendage thrombus was detected, mild to moderate mitral valve regurgitation,  trileaflet aortic valve with mild sclerosis without evidence of stenosis, mild plaque involving the descending aorta, no evidence of vegetation/infective endocarditis.  He was seen by cardiology in hospital follow-up on 07/14/2019 indicating he was feeling quite well.  He remained in rate controlled A. fib with consideration for DCCV down the road.  Given his cardiomyopathy, he underwent Lexiscan MPI on 07/28/2019 which showed no evidence of ischemia, EF 63%, and was overall a low risk study.  He comes in continuing to do reasonably well from a cardiac perspective.  He reports stable dyspnea which has been longstanding for several years.  No chest pain, palpitations, dizziness, presyncope, syncope, lower extremity swelling, abdominal distention, PND, or early satiety.  He does have the head of his bed slightly elevated, though this has been this way for many years and is unchanged.  He continues to work on a low-sodium diet though does continue to add small amounts of salt to certain foods, though not as much as he previously did.  His weight at home has been stable between 197 and 200 to 202 pounds.  More recently, BP has been in the low 335K to 562B systolic with occasional readings as low as the 63S systolic, though none recently.  He will be starting on Farxiga on 08/05/2019.  He is tolerating Eliquis without any symptoms concerning for bleeding and no falls.   Labs independently reviewed: 06/2019 - AST/ALT normal, albumin 3.1, potassium 3.7, BUN 30, serum creatinine 0.9 Hgb 13.6, PLT 306, magnesium 2.3, TSH normal  Past Medical History:  Diagnosis Date  . Arthritis  a. right knee  . Asthma   . Atrial tachycardia ([place])   . 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 etiology.  [name] COPD (chronic obstructive pulmonary disease) ([place])   . Coronary artery disease    a. 05/2012 Cath: severe 3VD-->CABG x 4 by Dr. Servando Snare in 1/2014[place], SVG->LCX & SVG->RPDA; b. 06/2014 MV: no ischemia/infarct.  .  Diastolic dysfunction    a. 06/2015 Echo: EF 50-55%, Gr1 DD. Mild MR. Mildly dil LA. Nl RV fxn. Nl PASP.  [name] Edema    LEGS/ FEET  . Essential hypertension   . History of bronchitis   . HOH (hard of hearing)   . Hypercholesteremia   . Neuropathy   . PAF (paroxysmal atrial fibrillation) ([facility])    a. CHA2DS2VASc = 4-->Eliquis.  . Shingles   . Sleep apnea    NO CPAP  . Type II diabetes mellitus ([place])     Past Surgical History:  Procedure Laterality Date  . ANTERIOR CERVICAL DECOMP/DISCECTOMY FUSION  ~ 2009  . BACK SURGERY     CERVICAL FUSION  . CARDIAC CATHETERIZATION  05/06/2012   Significant three-vessel coronary artery disease with normal ejection fraction  . CATARACT EXTRACTION W/PHACO Right 08/15/2017   Procedure: CATARACT EXTRACTION PHACO AND INTRAOCULAR LENS PLACEMENT (IOC);  Surgeon: Birder Robson, MD;  Location: [facility];  Service: Ophthalmology;  Laterality: Right;  [place] 00:23 AP% 13.3 CDE 3.11 Fluid pack lot # [id number]  . CATARACT EXTRACTION W/PHACO Left 10/03/2017   Procedure: CATARACT EXTRACTION PHACO AND INTRAOCULAR LENS PLACEMENT (IOC);  Surgeon: Birder Robson, MD;  Location: [facility];  Service: Ophthalmology;  Laterality: Left;  Lot #[id number] H [place]: 00:21.2 AP%:13.9 CDE: 2.96  . CORONARY ANGIOPLASTY    . CORONARY ARTERY BYPASS GRAFT  05/07/2012   Procedure: CORONARY ARTERY BYPASS GRAFTING (CABG);  Surgeon: [name], MD;  Location: [place];  Service: Open Heart Surgery;  Laterality: N/A;  times three  . INTRAOPERATIVE TRANSESOPHAGEAL ECHOCARDIOGRAM  05/07/2012   Procedure: INTRAOPERATIVE TRANSESOPHAGEAL ECHOCARDIOGRAM;  Surgeon: [name], MD;  Location: [place];  Service: Open Heart Surgery;  Laterality: N/A;  . KNEE ARTHROSCOPY  ~ 2000   "right" (05/06/2012)  . KNEE SURGERY  2003   right  . NECK SURGERY  2008   Plate in neck  . TEE WITHOUT CARDIOVERSION N/A 07/04/2019   Procedure: TRANSESOPHAGEAL ECHOCARDIOGRAM (TEE);  Surgeon: Nelva Bush, MD;   Location: [facility];  Service: Cardiovascular;  Laterality: N/A;    Current Medications: Current Meds  Medication Sig  . Albuterol Sulfate 108 (90 Base) MCG/ACT AEPB Inhale 2 puffs into the lungs every 6 (six) hours as needed (shortness of breath).   . Artificial Tear Ointment (DRY EYES OP) Place 1 drop into both eyes daily as needed (for dry eyes).  [name] atorvastatin (LIPITOR) 40 MG tablet Take 1 tablet by mouth once daily  . Blood Glucose Monitoring Suppl (ONE TOUCH ULTRA 2) w/Device KIT Use to check blood sugar as advised up to twice a day  . cetirizine (ZYRTEC) 10 MG tablet Take 10 mg by mouth daily.   . diclofenac sodium (VOLTAREN) 1 % GEL Apply 2 g topically 3 (three) times daily as needed (Use on elbow bursitis and knee).  [name] ELIQUIS 5 MG TABS tablet Take 1 tablet by mouth twice daily  . Ensure Max Protein (ENSURE MAX PROTEIN) LIQD Take 330 mLs (11 oz total) by mouth 2 (two) times daily.  . fluticasone (FLONASE) 50 MCG/ACT nasal spray Place  1 spray into both nostrils daily as needed for allergies or rhinitis.  . furosemide (LASIX) 40 MG tablet TAKE 1 TABLET BY MOUTH ONCE DAILY. APPOINTMENT NEEDED FOR FURTHER REFILLS. (Patient taking differently: Every other day)  . gabapentin (NEURONTIN) 100 MG capsule TAKE 1 CAPSULE BY MOUTH THREE TIMES DAILY  . guaiFENesin (MUCINEX) 600 MG 12 hr tablet Take 600 mg by mouth 2 (two) times daily.   [name] ipratropium-albuterol (DUONEB) 0.5-2.5 (3) MG/3ML SOLN Take 3 mLs by nebulization 4 (four) times daily.  [name] LANCETS ULTRA THIN MISC 1 Device by Does not apply route 4 (four) times daily -  before meals and at bedtime.  [name] levothyroxine (SYNTHROID) 88 MCG tablet Take 1 tablet by mouth once daily  . losartan (COZAAR) 25 MG tablet Take 0.5 tablets (12.5 mg total) by mouth daily.  . metFORMIN (GLUCOPHAGE) 500 MG tablet TAKE 2 TABLETS BY MOUTH TWICE DAILY WITH A MEAL (Patient taking differently: Take 1,000 mg by mouth 2 (two) times daily with a meal. )  . metoprolol  tartrate 75 MG TABS Take 75 mg by mouth 2 (two) times daily.  Glory Rosebush ULTRA test strip USE TEST STRIP(S) TO CHECK GLUCOSE TWICE DAILY  . predniSONE (DELTASONE) 5 MG tablet Take 5 mg by mouth daily with breakfast.  . SYMBICORT 160-4.5 MCG/ACT inhaler Inhale 2 puffs 2 (two) times daily into the lungs.     Allergies:   Bydureon [exenatide], Clindamycin/lincomycin, Penicillins, Sulfa antibiotics, Spiriferis, Spiriva handihaler [tiotropium bromide monohydrate], and Ivp dye [iodinated diagnostic agents]   Social History   Socioeconomic History  . Marital status: Married    Spouse name: Not on file  . Number of children: Not on file  . Years of education: Not on file  . Highest education level: Some college, no degree  Occupational History  . Not on file  Tobacco Use  . Smoking status: Former Smoker    Packs/day: 1.00    Years: 8.00    Pack years: 8.00    Types: Cigarettes    Quit date: 12/31/1967    Years since quitting: 51.6  . Smokeless tobacco: Former Network engineer and Sexual Activity  . Alcohol use: No    Alcohol/week: 0.0 standard drinks    Comment: 05/06/2012 "last alcohol several years ago; never had problem wit"  . Drug use: No  . Sexual activity: Not Currently  Other Topics Concern  . Not on file  Social History Narrative   ** Merged History Encounter **          Working part time - not currently working because of covid-19    Social Determinants of Radio broadcast assistant Strain:   . Difficulty of Paying Living Expenses:   Food Insecurity:   . Worried About Charity fundraiser in the Last Year:   . Arboriculturist in the Last Year:   Transportation Needs:   . Film/video editor (Medical):   [name] Lack of Transportation (Non-Medical):   Physical Activity:   . Days of Exercise per Week:   . Minutes of Exercise per Session:   Stress:   . Feeling of Stress :   Social Connections:   . Frequency of Communication with Friends and Family:   . Frequency of  Social Gatherings with Friends and Family:   . Attends Religious Services:   . Active Member of Clubs or Organizations:   . Attends Archivist Meetings:   [name] Marital Status:  Family History:  The patient's family history includes Clotting disorder in his mother; Heart disease in his mother; Hypertension in his sister.  ROS:   Review of Systems  Constitutional: Positive for malaise/fatigue. Negative for chills, diaphoresis, fever and weight loss.  HENT: Negative for congestion.   Eyes: Negative for discharge and redness.  Respiratory: Positive for shortness of breath and wheezing. Negative for cough, hemoptysis and sputum production.   Cardiovascular: Negative for chest pain, palpitations, orthopnea, claudication, leg swelling and PND.  Gastrointestinal: Negative for abdominal pain, blood in stool, heartburn, melena, nausea and vomiting.  Genitourinary: Negative for hematuria.  Musculoskeletal: Negative for falls and myalgias.  Skin: Negative for rash.  Neurological: Positive for weakness. Negative for dizziness, tingling, tremors, sensory change, speech change, focal weakness and loss of consciousness.  Endo/Heme/Allergies: Does not bruise/bleed easily.  Psychiatric/Behavioral: Negative for substance abuse. The patient is not nervous/anxious.   All other systems reviewed and are negative.    EKGs/Labs/Other Studies Reviewed:    Studies reviewed were summarized above. The additional studies were reviewed today:  2D echo 06/29/2019: 1. Left ventricular ejection fraction, by estimation, is 40 to 45%. The  left ventricle has mildly decreased function. The left ventricle demonstrates global hypokinesis. The left ventricular internal cavity size was moderately dilated. Left ventricular diastolic parameters are indeterminate.  2. Right ventricular systolic function is mildly reduced. The right ventricular size is mildly enlarged. There is moderately elevated pulmonary artery  systolic pressure.  3. Left atrial size was mildly dilated.  4. Right atrial size was mildly dilated.  5. The mitral valve is normal in structure and function. Mild to moderate  mitral valve regurgitation. No evidence of mitral stenosis.  6. Tricuspid valve regurgitation is mild to moderate.  7. No valve vegetation noted.  __________  TEE 07/04/2019: 1. Left ventricular ejection fraction, by estimation, is 30 to 35%. The  left ventricle has moderately decreased function. The left ventricle  demonstrates global hypokinesis.  2. Right ventricular systolic function is mildly reduced. The right  ventricular size is mildly enlarged.  3. No left atrial/left atrial appendage thrombus was detected.  4. The mitral valve is degenerative. Mild to moderate mitral valve  regurgitation. No evidence of mitral stenosis.  5. The aortic valve is tricuspid. Aortic valve regurgitation is not  visualized. Mild aortic valve sclerosis is present, with no evidence of  aortic valve stenosis.  6. There is mild (Grade II) plaque involving the descending aorta.   Conclusion(s)/Recommendation(s): No evidence of vegetation/infective  endocarditis on this transesophageal echocardiogram.  __________  Carlton Adam MPI 07/28/2019:  The study is normal.  This is a low risk study.  The left ventricular ejection fraction is normal (63%).  There is no evidence for ischemia   EKG:  EKG is ordered today.  The EKG ordered today demonstrates A. fib, 85 bpm, nonspecific ST-T changes involving the inferolateral leads which are slightly more pronounced today when compared to most recent study  Recent Labs: 06/30/2019: B Natriuretic Peptide 857.0; Magnesium 2.3; TSH 2.676 07/04/2019: BUN 30; Creatinine, Ser 0.89 07/11/2019: ALT 8; Hemoglobin 13.6; Platelets 306; Potassium 3.7; Sodium 140  Recent Lipid Panel    Component Value Date/Time   CHOL 125 06/24/2018 0846   CHOL 139 12/14/2014 0834   TRIG 97 06/24/2018 0846     HDL 46 06/24/2018 0846   HDL 43 12/14/2014 0834   CHOLHDL 2.7 06/24/2018 0846   LDLCALC 61 06/24/2018 0846    PHYSICAL EXAM:    VS:  BP  120/80 (BP Location: Left Arm, Patient Position: Sitting, Cuff Size: Normal)   Pulse 85   Ht 5' 11.5" (1.816 m)   Wt 203 lb 6 oz (92.3 kg)   SpO2 96%   BMI 27.97 kg/m   BMI: Body mass index is 27.97 kg/m.  Physical Exam  Wt Readings from Last 3 Encounters:  08/04/19 203 lb 6 oz (92.3 kg)  07/14/19 203 lb (92.1 kg)  07/11/19 202 lb (91.6 kg)     ASSESSMENT & PLAN:   1. CAD involving the native coronary arteries status post CABG without angina: He is doing well without any symptoms concerning for angina.  Recent Lexiscan MPI from last month showed no significant ischemia with improved LV SF with an EF of 63%.  He remains on Eliquis in place of ASA.  Continue secondary prevention with atorvastatin, losartan, and Metformin.  Continue risk factor modification.  No plans for further ischemic evaluation at this time.  2. 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  EF by Myoview normalized as outlined above with adequate rate control.  He appears euvolemic and well compensated.  Decrease Lasix to 20 mg every other day with the planned initiation of Farxiga on 08/05/2019.  Continue Lopressor 75 mg twice daily and losartan 12.5 mg daily.  Plan for follow-up limited echo in 1 month to reevaluate LV systolic function.  Further escalation of GDMT based on these results.  His longstanding dyspnea is likely multifactorial including underlying COPD, A. fib, cardiomyopathy, CAD, obesity, and physical deconditioning.  3. Persistent A. Fib/PAT: He remains in A. fib with controlled ventricular response and is asymptomatic.  I do not think his longstanding dyspnea can be solely explained by his A. fib and do not think restoration of sinus rhythm would play a significant role and symptom improvement.   The patient feels this way as well.  However, should he continue to have a cardiomyopathy on a follow-up echo next month we may need to revisit the idea of rhythm control strategy.  Otherwise, if his EF is preserved we will plan to continue rate control strategy given he is asymptomatic with his atrial arrhythmia.  Given his CHA2DS2-VASc of 5 (CHF, HTN, age x 1, DM, vascular disease) we will continue him on Eliquis 5 mg twice daily.  He does not meet reduced dosing criteria.  Recent hemoglobin normal with no symptoms concerning for bleeding.  Continue current dose metoprolol.  4. HTN: Blood pressure is well controlled today at 120/80.  Continue current therapy as outlined above.  5. HLD: LDL of 61 from 06/2018 with normal liver function from 06/2019.  Remains on atorvastatin 40 mg daily.  6. COPD: Stable without acute exacerbation.  Continue inhalers as directed by pulmonology.  7. DM2: Planning to start Chloride on 08/05/2019.  With this, we are decreasing his Lasix dose as outlined above and planning for a follow-up BMP in 2 weeks.  Orthostatic/dehydration symptoms were discussed with the patient in detail today with precautions given.  Based on his trend and weights, BP, and follow-up labs he may need to further address his medications.  A1c 7.9 from 06/2019.   Disposition: F/u with Dr. Fletcher Anon or an APP in 2 months.   Medication Adjustments/Labs and Tests Ordered: Current medicines are reviewed at length with the patient today.  Concerns regarding medicines are outlined above. Medication changes, Labs and Tests ordered today are summarized above and listed in the Patient Instructions accessible in Encounters.  Signed, Christell Faith, [name] 08/04/2019 3:27 PM     [facility] [address] [place], [place] [address] [phone]

## 2019-08-04 ENCOUNTER — Ambulatory Visit (INDEPENDENT_AMBULATORY_CARE_PROVIDER_SITE_OTHER): Payer: Medicare Other | Admitting: Physician Assistant

## 2019-08-04 ENCOUNTER — Other Ambulatory Visit: Payer: Self-pay

## 2019-08-04 ENCOUNTER — Ambulatory Visit (INDEPENDENT_AMBULATORY_CARE_PROVIDER_SITE_OTHER): Payer: Medicare Other | Admitting: Pharmacist

## 2019-08-04 ENCOUNTER — Encounter: Payer: Self-pay | Admitting: Physician Assistant

## 2019-08-04 VITALS — BP 120/80 | HR 85 | Ht 71.5 in | Wt 203.4 lb

## 2019-08-04 DIAGNOSIS — I2510 Atherosclerotic heart disease of native coronary artery without angina pectoris: Secondary | ICD-10-CM | POA: Diagnosis not present

## 2019-08-04 DIAGNOSIS — I4819 Other persistent atrial fibrillation: Secondary | ICD-10-CM

## 2019-08-04 DIAGNOSIS — N182 Chronic kidney disease, stage 2 (mild): Secondary | ICD-10-CM | POA: Diagnosis not present

## 2019-08-04 DIAGNOSIS — I471 Supraventricular tachycardia: Secondary | ICD-10-CM

## 2019-08-04 DIAGNOSIS — E1169 Type 2 diabetes mellitus with other specified complication: Secondary | ICD-10-CM | POA: Diagnosis not present

## 2019-08-04 DIAGNOSIS — J432 Centrilobular emphysema: Secondary | ICD-10-CM | POA: Diagnosis not present

## 2019-08-04 DIAGNOSIS — Z951 Presence of aortocoronary bypass graft: Secondary | ICD-10-CM

## 2019-08-04 DIAGNOSIS — I5022 Chronic systolic (congestive) heart failure: Secondary | ICD-10-CM | POA: Diagnosis not present

## 2019-08-04 DIAGNOSIS — I129 Hypertensive chronic kidney disease with stage 1 through stage 4 chronic kidney disease, or unspecified chronic kidney disease: Secondary | ICD-10-CM

## 2019-08-04 DIAGNOSIS — E785 Hyperlipidemia, unspecified: Secondary | ICD-10-CM

## 2019-08-04 DIAGNOSIS — I5020 Unspecified systolic (congestive) heart failure: Secondary | ICD-10-CM | POA: Diagnosis not present

## 2019-08-04 DIAGNOSIS — E118 Type 2 diabetes mellitus with unspecified complications: Secondary | ICD-10-CM

## 2019-08-04 DIAGNOSIS — J449 Chronic obstructive pulmonary disease, unspecified: Secondary | ICD-10-CM

## 2019-08-04 DIAGNOSIS — I10 Essential (primary) hypertension: Secondary | ICD-10-CM

## 2019-08-04 MED ORDER — FUROSEMIDE 40 MG PO TABS: 20.0000 mg | ORAL_TABLET | ORAL | 3 refills | Status: DC

## 2019-08-04 NOTE — Patient Instructions (Signed)
Thank you allowing the Chronic Care Management Team to be a part of your care! It was a pleasure speaking with you today!     CCM (Chronic Care Management) Team    Noreene Larsson RN, MSN, CCM Nurse Care Coordinator  [phone]   [name] PharmD  Clinical Pharmacist  [phone]   Eula Fried LCSW Clinical Social Worker [phone]  Visit Information  Goals Addressed            This Visit's Progress   . PharmD - Medication Management       CARE PLAN ENTRY (see longitudinal plan of care for additional care plan information)  Current Barriers:  [name] Knowledge deficits related to dietary choices to improve blood sugar control . Financial - currently in coverage gap of Medicare Part D plan  Pharmacist Clinical Goal(s):  [name] Over the next 30 days, patient will work with CM Pharmacist to address needs related to medication regimen optimization.  Interventions: . Follow up regarding blood sugar control and monitoring o Reports currently taking  metformin 500 mg - 2 tablets (1,000 mg) twice daily as directed  Tradjenta 5 mg once daily o Review recent blood sugar results (see below) o Reports today is his last dose of Tradjenta. Planning to start Farxiga 5 mg once daily tomorrow, 4/6 . Advise patient to f/u with PCP office for lab work in 2 weeks . Review recent BP results (see below) and encourage patient to bring BP log with him to Cardiology appointment this afternoon.  Review again with patient instruction from [facility] regarding instructions for BP monitoring/Lasix adjustment with start of Farxiga:  "keep close eye on BP and renal function. He takes Lasix 40 mg every other day. It may need to be decreased after starting [place].  Will need a BMET after initiation. Tell the patient to keep a close eye on his BP. If SBP decreases to below 100 , he will need to decrease the lasix dose to 20 mg every other day." o Patient confirms that he will also  discuss today with Cardiologist at appointment for avoiding dedhyration/low BP . Counsel on importance of medication adherence, including to inhalers  Patient Self Care Activities:  . Self administers medications as prescribed . Attends all scheduled provider appointments o Next appointment with Cardiologist on 4/5 o Next appointment with Pulmonology on 4/12 . Calls pharmacy for medication refills . Calls provider office for new concerns or questions  . Patient to check blood sugar regularly as directed and keep log  Fasting Blood Glucose After Supper  (~2 hours)  1 - April 160 -  2 - April 121 205  3 - April 127 266  4 - April 161 271  5 - April 147    . Patient to keep log when checks blood pressure  AM BP PM BP  1 - April 115/75, HR 94 -  2 - April 128/92, HR 87  109/82, HR 98  3 - April 118/81, HR 81 101/69, HR 92  4 - April 109/69, HR 92 110/77, HR 82  5 - April 102/75, HR 70     Please see past updates related to this goal by clicking on the "Past Updates" button in the selected goal         Patient verbalizes understanding of instructions provided today.   Telephone follow up appointment with care management team member scheduled for: 4/16 at 8:30 am  [name], PharmD, [place]  [employer] [phone]

## 2019-08-04 NOTE — Patient Instructions (Signed)
Medication Instructions:  1- DECREASE Lasix to 0.5 tablet (20 mg total) every other day  *If you need a refill on your cardiac medications before your next appointment, please call your pharmacy*   Lab Work: 1- Your physician recommends that you return for lab work in: 2 weeks at the medical mall. (BMET) No appt is needed. Hours are M-F 7AM- 6 PM.  If you have labs (blood work) drawn today and your tests are completely normal, you will receive your results only by: [name] MyChart Message (if you have MyChart) OR . A paper copy in the mail If you have any lab test that is abnormal or we need to change your treatment, we will call you to review the results.   Testing/Procedures: 1- Limited Echo  Please return to [facility] on ______________ at _______________ AM/PM for an Echocardiogram. Your physician has requested that you have an echocardiogram. Echocardiography is a painless test that uses sound waves to create images of your heart. It provides your doctor with information about the size and shape of your heart and how well your heart's chambers and valves are working. This procedure takes approximately one hour. There are no restrictions for this procedure. Please note; depending on visual quality an IV may need to be placed.     Follow-Up: At [facility], you and your health needs are our priority.  As part of our continuing mission to provide you with exceptional heart care, we have created designated Provider Care Teams.  These Care Teams include your primary Cardiologist (physician) and Advanced Practice Providers (APPs -  Physician Assistants and Nurse Practitioners) who all work together to provide you with the care you need, when you need it.  We recommend signing up for the patient portal called "MyChart".  Sign up information is provided on this After Visit Summary.  MyChart is used to connect with patients for Virtual Visits (Telemedicine).  Patients are able to view  lab/test results, encounter notes, upcoming appointments, etc.  Non-urgent messages can be sent to your provider as well.   To learn more about what you can do with MyChart, go to [email or web address].    Your next appointment:   2 month(s)  The format for your next appointment:   In Person  Provider:    You may see [name], MD or Christell Faith, [name].   Other Instructions 1- Call for BP for systolic 90 or less consistent readings. (meaning if you sit for 5 min and BP remains that number or if you are having dizziness or weakness with standing).

## 2019-08-04 NOTE — Chronic Care Management (AMB) (Signed)
Chronic Care Management   Follow Up Note   08/04/2019 Name: Philip TUDISCO Sr. MRN: [id number] DOB: 03-22-1946  Referred by: Philip Hauser, DO Reason for referral : Chronic Care Management (Patient Phone Call)   Philip Potters Sr. is a 74 y.o. year old male who is a primary care patient of Philip Hauser, DO. The CCM team was consulted for assistance with chronic disease management and care coordination needs. Mr. [name] has a past medical history including but not limited to type 2 diabetes, CAD, atrial fibrillation, CHF, hypothyroidism, OSA, hyperlipidemia, hypertension, CKD, seasonal allergies, knee OA and COPD.  I reached out to Philip Potters Sr. by phone today.   Review of patient status, including review of consultants reports, relevant laboratory and other test results, and collaboration with appropriate care team members and the patient's provider was performed as part of comprehensive patient evaluation and provision of chronic care management services.     Outpatient Encounter Medications as of 08/04/2019  Medication Sig Note  . metFORMIN (GLUCOPHAGE) 500 MG tablet TAKE 2 TABLETS BY MOUTH TWICE DAILY WITH A MEAL (Patient taking differently: Take 1,000 mg by mouth 2 (two) times daily with a meal. )   . SYMBICORT 160-4.5 MCG/ACT inhaler Inhale 2 puffs 2 (two) times daily into the lungs.    . Albuterol Sulfate 108 (90 Base) MCG/ACT AEPB Inhale 2 puffs into the lungs every 6 (six) hours as needed (shortness of breath).    . Artificial Tear Ointment (DRY EYES OP) Place 1 drop into both eyes daily as needed (for dry eyes).   Philip [name] atorvastatin (LIPITOR) 40 MG tablet Take 1 tablet by mouth once daily   . Blood Glucose Monitoring Suppl (ONE TOUCH ULTRA 2) w/Device KIT Use to check blood sugar as advised up to twice a day   . cetirizine (ZYRTEC) 10 MG tablet Take 10 mg by mouth daily.    . dapagliflozin propanediol (FARXIGA) 5 MG TABS tablet Take 5 mg by mouth daily before  breakfast. (Patient not taking: Reported on 07/23/2019) 08/04/2019: Planning to start on 4/6 (after finished supply of Tradjenta)  . diclofenac sodium (VOLTAREN) 1 % GEL Apply 2 g topically 3 (three) times daily as needed (Use on elbow bursitis and knee).   Philip [name] ELIQUIS 5 MG TABS tablet Take 1 tablet by mouth twice daily   . Ensure Max Protein (ENSURE MAX PROTEIN) LIQD Take 330 mLs (11 oz total) by mouth 2 (two) times daily.   . fluticasone (FLONASE) 50 MCG/ACT nasal spray Place 1 spray into both nostrils daily as needed for allergies or rhinitis.   . furosemide (LASIX) 40 MG tablet TAKE 1 TABLET BY MOUTH ONCE DAILY. APPOINTMENT NEEDED FOR FURTHER REFILLS. (Patient taking differently: Every other day)   . gabapentin (NEURONTIN) 100 MG capsule TAKE 1 CAPSULE BY MOUTH THREE TIMES DAILY   . guaiFENesin (MUCINEX) 600 MG 12 hr tablet Take 600 mg by mouth 2 (two) times daily.    Philip [name] ipratropium-albuterol (DUONEB) 0.5-2.5 (3) MG/3ML SOLN Take 3 mLs by nebulization 4 (four) times daily.   Philip [name] LANCETS ULTRA THIN MISC 1 Device by Does not apply route 4 (four) times daily -  before meals and at bedtime.   Philip [name] levothyroxine (SYNTHROID) 88 MCG tablet Take 1 tablet by mouth once daily   . losartan (COZAAR) 25 MG tablet Take 0.5 tablets (12.5 mg total) by mouth daily.   . metoprolol tartrate 75 MG TABS Take 75 mg by mouth 2 (two) times  daily.   Philip [name] ULTRA test strip USE TEST STRIP(S) TO CHECK GLUCOSE TWICE DAILY   . predniSONE (DELTASONE) 5 MG tablet 4 tabs po day1; 3 tabs po day2; 2 tabs po day3,4; 1 tab po day afterwards (Patient taking differently: Take 5 mg by mouth daily with breakfast. )   . [DISCONTINUED] cephALEXin (KEFLEX) 500 MG capsule Take 500 mg by mouth 3 (three) times daily.    No facility-administered encounter medications on file as of 08/04/2019.    Goals Addressed            This Visit's Progress   . PharmD - Medication Management       CARE PLAN ENTRY (see longitudinal plan of care for  additional care plan information)  Current Barriers:  Philip [name] Knowledge deficits related to dietary choices to improve blood sugar control . Financial - currently in coverage gap of Medicare Part D plan  Pharmacist Clinical Goal(s):  Philip [name] Over the next 30 days, patient will work with CM Pharmacist to address needs related to medication regimen optimization.  Interventions: . Follow up regarding blood sugar control and monitoring o Reports currently taking  metformin 500 mg - 2 tablets (1,000 mg) twice daily as directed  Tradjenta 5 mg once daily o Review recent blood sugar results (see below) o Reports today is his last dose of Tradjenta. Planning to start Farxiga 5 mg once daily tomorrow, 4/6 . Advise patient to f/u with PCP office for lab work in 2 weeks . Review recent BP results (see below) and encourage patient to bring BP log with him to Cardiology appointment this afternoon.  Review again with patient instruction from [place] regarding instructions for BP monitoring/Lasix adjustment with start of Farxiga:  "keep close eye on BP and renal function. He takes Lasix 40 mg every other day. It may need to be decreased after starting [place].  Will need a BMET after initiation. Tell the patient to keep a close eye on his BP. If SBP decreases to below 100 , he will need to decrease the lasix dose to 20 mg every other day." o Patient confirms that he will also discuss today with Cardiologist at appointment for avoiding dedhyration/low BP . Counsel on importance of medication adherence, including to inhalers  Patient Self Care Activities:  . Self administers medications as prescribed . Attends all scheduled provider appointments o Next appointment with Cardiologist on 4/5 o Next appointment with Pulmonology on 4/12 . Calls pharmacy for medication refills . Calls provider office for new concerns or questions  . Patient to check blood sugar regularly as directed and keep log   Fasting Blood Glucose After Supper  (~2 hours)  1 - April 160 -  2 - April 121 205  3 - April 127 266  4 - April 161 271  5 - April 147    . Patient to keep log when checks blood pressure  AM BP PM BP  1 - April 115/75, HR 94 -  2 - April 128/92, HR 87  109/82, HR 98  3 - April 118/81, HR 81 101/69, HR 92  4 - April 109/69, HR 92 110/77, HR 82  5 - April 102/75, HR 70     Please see past updates related to this goal by clicking on the "Past Updates" button in the selected goal         Plan  Telephone follow up appointment with care management team member scheduled for: 4/16 at 8:30 am  [name], PharmD, [place] [employer] [phone]

## 2019-08-11 DIAGNOSIS — R0600 Dyspnea, unspecified: Secondary | ICD-10-CM | POA: Diagnosis not present

## 2019-08-11 DIAGNOSIS — G4733 Obstructive sleep apnea (adult) (pediatric): Secondary | ICD-10-CM | POA: Diagnosis not present

## 2019-08-11 DIAGNOSIS — J439 Emphysema, unspecified: Secondary | ICD-10-CM | POA: Diagnosis not present

## 2019-08-12 ENCOUNTER — Other Ambulatory Visit: Payer: Self-pay | Admitting: Pharmacy Technician

## 2019-08-12 NOTE — Patient Outreach (Signed)
[facility]) Care Management  08/12/2019  Philip BREECH Sr. 19-Feb-1946 [id number]  Care coordination call placed to [employer] in regards to patient's Farxiga application.  Spoke to [place] who informed patient was APPROVED 08/06/2019-04/30/2020. Mariann Laster placed the order for the patient today. She informed it would be delivered to his home in the next 7-10 business days from today.  Will follow up with patient in 7-14 business days to inquire if it was received and to discuss refill procedure.  Ramil Edgington P. Cherylanne Ardelean, [place]  [phone]

## 2019-08-13 DIAGNOSIS — R0600 Dyspnea, unspecified: Secondary | ICD-10-CM | POA: Diagnosis not present

## 2019-08-15 ENCOUNTER — Encounter: Payer: Self-pay | Admitting: Family Medicine

## 2019-08-15 ENCOUNTER — Other Ambulatory Visit: Payer: Self-pay

## 2019-08-15 ENCOUNTER — Ambulatory Visit (INDEPENDENT_AMBULATORY_CARE_PROVIDER_SITE_OTHER): Payer: Medicare Other | Admitting: Family Medicine

## 2019-08-15 ENCOUNTER — Ambulatory Visit: Payer: Self-pay | Admitting: Pharmacist

## 2019-08-15 VITALS — BP 107/59 | HR 60 | Temp 97.1°F | Resp 16 | Ht 71.5 in | Wt 205.0 lb

## 2019-08-15 DIAGNOSIS — N182 Chronic kidney disease, stage 2 (mild): Secondary | ICD-10-CM | POA: Diagnosis not present

## 2019-08-15 DIAGNOSIS — E1169 Type 2 diabetes mellitus with other specified complication: Secondary | ICD-10-CM | POA: Diagnosis not present

## 2019-08-15 DIAGNOSIS — M10071 Idiopathic gout, right ankle and foot: Secondary | ICD-10-CM | POA: Diagnosis not present

## 2019-08-15 DIAGNOSIS — J432 Centrilobular emphysema: Secondary | ICD-10-CM

## 2019-08-15 DIAGNOSIS — I5022 Chronic systolic (congestive) heart failure: Secondary | ICD-10-CM

## 2019-08-15 DIAGNOSIS — E785 Hyperlipidemia, unspecified: Secondary | ICD-10-CM | POA: Diagnosis not present

## 2019-08-15 DIAGNOSIS — I129 Hypertensive chronic kidney disease with stage 1 through stage 4 chronic kidney disease, or unspecified chronic kidney disease: Secondary | ICD-10-CM

## 2019-08-15 MED ORDER — PREDNISONE 20 MG PO TABS: ORAL_TABLET | ORAL | 0 refills | Status: DC

## 2019-08-15 NOTE — Patient Instructions (Addendum)
Thank you for coming to the office today.  Your Right Foot pain is most likely caused an Acute Gout Flare - Gout is a chronic problem that will have episodic flare ups with pain, redness, swelling of a joint, most common spots are big toe, foot and ankle, knee or sometimes hands or wrists. It is caused by small crystals made of Uric Acid that form in the joint causing pain and swelling.  Start Prednisone burst taper over 7 days, then resume your previous dose 5mg  daily.  Gout flares can repeat again soon after they resolve in the same spot or other joints, and may need repeat treatment.  Our goal is to prevent future gout flares. Try to avoid dietary triggers that are the most common causes of gout flares. - Avoid the following foods/drinks: - Red meat, organ meat (liver) - Alcohol (especially beer, also wine, liquor) - Processed foods / carbs (white bread, white rice, pasta, sugar) - Sugary drinks (sweet tea, soda) - Shellfish, shrimp / lobster  - Foods that are preferred to eat: - Beans, Lentils, Whole grains, Quinoa - Fruits, Vegetables - Dairy, Cheese, Yogurt - Soy based protein  We can do a blood test to check Uric Acid level, but the best way to confirm a diagnosis   BLOOD TEST FOR GOUT AS WELL ON 4/19 - double check that they draw this one.  Please schedule a Follow-up Appointment to: Return in about 4 weeks (around 09/12/2019) for follow-up as needed for gout.  If you have any other questions or concerns, please feel free to call the office or send a message through [facility]. You may also schedule an earlier appointment if necessary.  Additionally, you may be receiving a survey about your experience at our office within a few days to 1 week by e-mail or mail. We value your feedback.  Philip Putnam, DO [place]

## 2019-08-15 NOTE — Progress Notes (Signed)
Subjective:    Patient ID: Philip Husbands., male    DOB: Jul 15, 1945, 74 y.o.   MRN: [id number]  Philip BERDECIA Sr. is a 74 y.o. male presenting on 08/15/2019 for Foot Swelling (Right side onset 4 day )   HPI   Right Foot Swelling Pain / Acute Gout suspected Reports new onset x 3-4 day ago R mid foot swelling and some redness ,with pain. Difficulty ambulating on it, sensitive to light touch and painful. No other swelling, not of toe or leg. He has history of venous insufficiency varicose veins and history of CHF. He takes fluid pill lasix every other day but this is new problem. He has not had gout before. Did eat red meat other day. Not on preventative. He takes prednisone 5mg  daily for COPD per Dr Ra[facility] pulm Denies fever chills sweats other joint pain or swelling, injury  Depression screen [facility] 2/9 03/12/2019 12/31/2018 12/02/2018  Decreased Interest 0 0 0  Down, Depressed, Hopeless 0 0 0  PHQ - 2 Score 0 0 0  Altered sleeping - - -  Tired, decreased energy - - -  Change in appetite - - -  Feeling bad or failure about yourself  - - -  Trouble concentrating - - -  Moving slowly or fidgety/restless - - -  Suicidal thoughts - - -  PHQ-9 Score - - -  Difficult doing work/chores - - -    Social History   Tobacco Use  . Smoking status: Former Smoker    Packs/day: 1.00    Years: 8.00    Pack years: 8.00    Types: Cigarettes    Quit date: 12/31/1967    Years since quitting: 51.6  . Smokeless tobacco: Former Network engineer Use Topics  . Alcohol use: No    Alcohol/week: 0.0 standard drinks    Comment: 05/06/2012 "last alcohol several years ago; never had problem wit"  . Drug use: No    Review of Systems Per HPI unless specifically indicated above     Objective:    BP (!) 107/59   Pulse 60   Temp (!) 97.1 F (36.2 C) (Temporal)   Resp 16   Ht 5' 11.5" (1.816 m)   Wt 205 lb (93 kg)   BMI 28.19 kg/m   Wt Readings from Last 3 Encounters:  08/15/19 205 lb (93 kg)    08/04/19 203 lb 6 oz (92.3 kg)  07/14/19 203 lb (92.1 kg)    Physical Exam Vitals and nursing note reviewed.  Constitutional:      General: He is not in acute distress.    Appearance: He is well-developed. He is not diaphoretic.     Comments: Well-appearing, comfortable, cooperative  HENT:     Head: Normocephalic and atraumatic.  Eyes:     General:        Right eye: No discharge.        Left eye: No discharge.     Conjunctiva/sclera: Conjunctivae normal.  Cardiovascular:     Rate and Rhythm: Normal rate.  Pulmonary:     Effort: Pulmonary effort is normal. No respiratory distress.     Breath sounds: Normal breath sounds. No wheezing or rales.  Musculoskeletal:     Right lower leg: No edema (No significant LE edema today).     Left lower leg: No edema.     Comments: Right Foot Localized mild non pitting edema of midfoot inner aspect, some mild erythema warmth non specific  mid inner foot, not involving R great toe, or heel or hindfoot. Not involving ankle and leg.  Skin:    General: Skin is warm and dry.     Findings: No erythema or rash.  Neurological:     Mental Status: He is alert and oriented to person, place, and time.  Psychiatric:        Behavior: Behavior normal.     Comments: Well groomed, good eye contact, normal speech and thoughts    Results for orders placed or performed during the hospital encounter of 07/28/19  NM Myocar Multi W/Spect W/Wall Motion / EF  Result Value Ref Range   Rest HR 86 bpm   Rest BP 122/73 mmHg   Exercise duration (sec) 0 sec   Percent HR 70 %   Exercise duration (min) 0 min   Estimated workload 1.0 METS   Peak HR 105 bpm   Peak BP 137/70 mmHg   MPHR 148 bpm   SSS 2    SRS 5    SDS 0    TID 1.02    LV sys vol 21 mL   LV dias vol 49 62 - 150 mL      Assessment & Plan:   Problem List Items Addressed This Visit    None    Visit Diagnoses    Acute idiopathic gout of right foot    -  Primary   Relevant Medications    predniSONE (DELTASONE) 20 MG tablet   Other Relevant Orders   Uric acid      Clinically consistent with acute gout flare of Right foot 3-4 days ago No prior history of gout before Has CKD and dietary risk factors Differential Dx - unlikely trauma without known inciting injury, unlikely OA, no sign of infection or systemic symptoms. - No prior uric acid level - Not on gout med - On chronic low dose prednisone 5mg  daily has taken burst in past  Plan: 1. Since 3-4 days in - will start Prednisone burst taper 7 day taper as prescribed, hold 5mg  daily until finish then resume - He cannot take NSAIDs 2. Avoid excessive ambulation, relative rest, ice if helps, can take Tylenol PRN 3. Avoid food triggers (red meat, alcohol) Follow-up  within 4 weeks once acute flare resolved, discuss uric acid lower therapy and prevention  Check uric acid - added future lab for 08/18/19  Meds ordered this encounter  Medications  . predniSONE (DELTASONE) 20 MG tablet    Sig: Take daily with food. Start with 60mg  (3 pills) x 2 days, then reduce to 40mg  (2 pills) x 2 days, then 20mg  (1 pill) x 3 days    Dispense:  13 tablet    Refill:  0      Follow up plan: Return in about 4 weeks (around 09/12/2019) for follow-up as needed for gout.  Future labs ordered for 08/18/19 as planned already with BMET due to lasix dosing, also will add Uric Acid.  [name], [facility] 08/15/2019, 10:26 AM

## 2019-08-15 NOTE — Chronic Care Management (AMB) (Signed)
Chronic Care Management   Follow Up Note   08/15/2019 Name: Philip VANGILDER Sr. MRN: [id number] DOB: 11-01-1945  Referred by: [name], DO Reason for referral : Chronic Care Management (Patient Phone Call)   [name] is a 74 y.o. year old male who is a primary care patient of [name], DO. The CCM team was consulted for assistance with chronic disease management and care coordination needs. Mr. Casique has a past medical history including but not limited to type 2 diabetes, CAD, atrial fibrillation, CHF, hypothyroidism, OSA, hyperlipidemia, hypertension, CKD, seasonal allergies, knee OA and COPD.   I reached out to [name] by phone today.   Review of patient status, including review of consultants reports, relevant laboratory and other test results, and collaboration with appropriate care team members and the patient's provider was performed as part of comprehensive patient evaluation and provision of chronic care management services.     Outpatient Encounter Medications as of 08/15/2019  Medication Sig  . Albuterol Sulfate 108 (90 Base) MCG/ACT AEPB Inhale 2 puffs into the lungs every 6 (six) hours as needed (shortness of breath).   . dapagliflozin propanediol (FARXIGA) 5 MG TABS tablet Take 5 mg by mouth daily before breakfast.  . ELIQUIS 5 MG TABS tablet Take 1 tablet by mouth twice daily  . fluticasone (FLONASE) 50 MCG/ACT nasal spray Place 1 spray into both nostrils daily as needed for allergies or rhinitis.  . furosemide (LASIX) 40 MG tablet Take 0.5 tablets (20 mg total) by mouth every other day. Every other day  . losartan (COZAAR) 25 MG tablet Take 0.5 tablets (12.5 mg total) by mouth daily.  . metFORMIN (GLUCOPHAGE) 500 MG tablet TAKE 2 TABLETS BY MOUTH TWICE DAILY WITH A MEAL (Patient taking differently: Take 1,000 mg by mouth 2 (two) times daily with a meal. )  . metoprolol tartrate 75 MG TABS Take 75 mg by mouth 2 (two) times  daily.  . predniSONE (DELTASONE) 5 MG tablet Take 5 mg by mouth daily with breakfast.  . SYMBICORT 160-4.5 MCG/ACT inhaler Inhale 2 puffs 2 (two) times daily into the lungs.   . Artificial Tear Ointment (DRY EYES OP) Place 1 drop into both eyes daily as needed (for dry eyes).  [name] atorvastatin (LIPITOR) 40 MG tablet Take 1 tablet by mouth once daily  . Blood Glucose Monitoring Suppl (ONE TOUCH ULTRA 2) w/Device KIT Use to check blood sugar as advised up to twice a day  . cetirizine (ZYRTEC) 10 MG tablet Take 10 mg by mouth daily.   . diclofenac sodium (VOLTAREN) 1 % GEL Apply 2 g topically 3 (three) times daily as needed (Use on elbow bursitis and knee).  . Ensure Max Protein (ENSURE MAX PROTEIN) LIQD Take 330 mLs (11 oz total) by mouth 2 (two) times daily.  [name] gabapentin (NEURONTIN) 100 MG capsule TAKE 1 CAPSULE BY MOUTH THREE TIMES DAILY  . guaiFENesin (MUCINEX) 600 MG 12 hr tablet Take 600 mg by mouth 2 (two) times daily.   [name] ipratropium-albuterol (DUONEB) 0.5-2.5 (3) MG/3ML SOLN Take 3 mLs by nebulization 4 (four) times daily.  [name] LANCETS ULTRA THIN MISC 1 Device by Does not apply route 4 (four) times daily -  before meals and at bedtime.  [name] levothyroxine (SYNTHROID) 88 MCG tablet Take 1 tablet by mouth once daily  . ONETOUCH ULTRA test strip USE TEST STRIP(S) TO CHECK GLUCOSE TWICE DAILY  . [DISCONTINUED] predniSONE (DELTASONE) 5 MG tablet 4  tabs po day1; 3 tabs po day2; 2 tabs po day3,4; 1 tab po day afterwards (Patient taking differently: Take 5 mg by mouth daily with breakfast. )   No facility-administered encounter medications on file as of 08/15/2019.    Goals Addressed            This Visit's Progress   . PharmD - Medication Management       CARE PLAN ENTRY (see longitudinal plan of care for additional care plan information)  Current Barriers:  [name] Knowledge deficits related to dietary choices to improve blood sugar control . Financial - currently in coverage gap of Medicare Part  D plan  Pharmacist Clinical Goal(s):  [name] Over the next 30 days, patient will work with CM Pharmacist to address needs related to medication regimen optimization.  Interventions: . Mr. Depuy reports has pain in right foot. Note patient scheduled to see PCP about foot pain this morning . Follow up regarding blood sugar control and monitoring o Reports currently taking  metformin 500 mg - 2 tablets (1,000 mg) twice daily as directed  Farxiga 5 mg once daily  Confirms has STOPPED Tradjenta o Review recent blood sugar results (see below) o Confirms planning to return for lab work on 4/20 . Review counseling on new Farxiga Rx, including strategies for prevention of adverse effects, importance of continuing to monitor fluid status and importance of follow up with providers for any new conditions/illness effecting hydration . Discuss blood pressure/ HR monitoring and control o Reports currently taking:  Furosemide 40 mg - 1/2 tablet (20 mg) every other day (as adjusted by Cardiologist at last visit)  Losartan 25 mg - 1/2 tablet (12.5 mg) once daily  Metoprolol 75 mg twice daily o Review recent BP results (see below) . Counsel patient on importance of contacting Cardiologist for BP and HR readings outside of established parameters . Counsel on importance of medication adherence, including to inhaler  Patient Self Care Activities:  . Self administers medications as prescribed . Attends all scheduled provider appointments . Calls pharmacy for medication refills . Calls provider office for new concerns or questions  . Patient to check blood sugar regularly as directed and keep log  Fasting Blood Glucose After Supper  (~2 hours)  10 - April 146 199  11 - April 135 216  12 - April 118 327  13 - April 150 205  14 - April 122 255  15 - April 141 181  16 - April 122   Average 133 230   . Patient to keep log when checks blood pressure  AM BP PM BP  10 - April 130/83, HR 97 124/79, HR 88   11 - April 126/92, HR 96 106/80, HR 98  12 - April 122/85, HR 85 118/73, HR 87  13 - April 118/74, HR 77 111/79, HR 90  14 - April 115/82, HR 79 116/74, HR 73  15 - April 124/85, HR 73 102/71, HR 87  16 - April 126/81, HR 68      Please see past updates related to this goal by clicking on the "Past Updates" button in the selected goal         Plan  Telephone follow up appointment with care management team member scheduled for: 5/7 at 9 am  [name], PharmD, [place] [phone]

## 2019-08-15 NOTE — Patient Instructions (Signed)
Thank you allowing the Chronic Care Management Team to be a part of your care! It was a pleasure speaking with you today!     CCM (Chronic Care Management) Team    Noreene Larsson RN, MSN, CCM Nurse Care Coordinator  [phone]   [name] PharmD  Clinical Pharmacist  [phone]   Eula Fried LCSW Clinical Social Worker [phone]  Visit Information  Goals Addressed            This Visit's Progress   . PharmD - Medication Management       CARE PLAN ENTRY (see longitudinal plan of care for additional care plan information)  Current Barriers:  [name] Knowledge deficits related to dietary choices to improve blood sugar control . Financial - currently in coverage gap of Medicare Part D plan  Pharmacist Clinical Goal(s):  [name] Over the next 30 days, patient will work with CM Pharmacist to address needs related to medication regimen optimization.  Interventions: . Mr. Swagger reports has pain in right foot. Note patient scheduled to see PCP about foot pain this morning . Follow up regarding blood sugar control and monitoring o Reports currently taking  metformin 500 mg - 2 tablets (1,000 mg) twice daily as directed  Farxiga 5 mg once daily  Confirms has STOPPED Tradjenta o Review recent blood sugar results (see below) o Confirms planning to return for lab work on 4/20 . Review counseling on new Farxiga Rx, including strategies for prevention of adverse effects, importance of continuing to monitor fluid status and importance of follow up with providers for any new conditions/illness effecting hydration . Discuss blood pressure/ HR monitoring and control o Reports currently taking:  Furosemide 40 mg - 1/2 tablet (20 mg) every other day (as adjusted by Cardiologist at last visit)  Losartan 25 mg - 1/2 tablet (12.5 mg) once daily  Metoprolol 75 mg twice daily o Review recent BP results (see below) . Counsel patient on importance of contacting Cardiologist for BP and  HR readings outside of established parameters . Counsel on importance of medication adherence, including to inhaler  Patient Self Care Activities:  . Self administers medications as prescribed . Attends all scheduled provider appointments . Calls pharmacy for medication refills . Calls provider office for new concerns or questions  . Patient to check blood sugar regularly as directed and keep log  Fasting Blood Glucose After Supper  (~2 hours)  10 - April 146 199  11 - April 135 216  12 - April 118 327  13 - April 150 205  14 - April 122 255  15 - April 141 181  16 - April 122   Average 133 230   . Patient to keep log when checks blood pressure  AM BP PM BP  10 - April 130/83, HR 97 124/79, HR 88  11 - April 126/92, HR 96 106/80, HR 98  12 - April 122/85, HR 85 118/73, HR 87  13 - April 118/74, HR 77 111/79, HR 90  14 - April 115/82, HR 79 116/74, HR 73  15 - April 124/85, HR 73 102/71, HR 87  16 - April 126/81, HR 68      Please see past updates related to this goal by clicking on the "Past Updates" button in the selected goal         Patient verbalizes understanding of instructions provided today.   Telephone follow up appointment with care management team member scheduled for: 5/7 at 9 am  [name], PharmD, [place] [employer] [phone]

## 2019-08-16 ENCOUNTER — Other Ambulatory Visit: Payer: Self-pay | Admitting: Cardiovascular Disease

## 2019-08-18 ENCOUNTER — Other Ambulatory Visit: Payer: Self-pay | Admitting: Family Medicine

## 2019-08-18 ENCOUNTER — Other Ambulatory Visit
Admission: RE | Admit: 2019-08-18 | Discharge: 2019-08-18 | Disposition: A | Discharge status: Home / Self Care | Payer: Medicare Other | Source: Ambulatory Visit | Attending: Family Medicine | Admitting: Family Medicine

## 2019-08-18 DIAGNOSIS — I5020 Unspecified systolic (congestive) heart failure: Secondary | ICD-10-CM

## 2019-08-18 LAB — BASIC METABOLIC PANEL
Anion gap: 11 (ref 5–15)
BUN: 32 mg/dL — ABNORMAL HIGH (ref 8–23)
CO2: 24 mmol/L (ref 22–32)
Calcium: 9.4 mg/dL (ref 8.9–10.3)
Chloride: 104 mmol/L (ref 98–111)
Creatinine, Ser: 1.33 mg/dL — ABNORMAL HIGH (ref 0.61–1.24)
GFR calc Af Amer: >60 mL/min (ref >60)
GFR calc non Af Amer: 53 mL/min — ABNORMAL LOW (ref >60)
Glucose, Bld: 375 mg/dL — ABNORMAL HIGH (ref 70–99)
Potassium: 5.1 mmol/L (ref 3.5–5.1)
Sodium: 139 mmol/L (ref 135–145)

## 2019-08-18 LAB — URIC ACID: Uric Acid, Serum: 6.7 mg/dL (ref 3.7–8.6)

## 2019-08-18 NOTE — Telephone Encounter (Signed)
Refill Request.  

## 2019-08-19 ENCOUNTER — Telehealth: Payer: Self-pay

## 2019-08-19 DIAGNOSIS — I2510 Atherosclerotic heart disease of native coronary artery without angina pectoris: Secondary | ICD-10-CM

## 2019-08-19 MED ORDER — METOPROLOL TARTRATE 75 MG PO TABS: 75.0000 mg | ORAL_TABLET | Freq: Two times a day (BID) | ORAL | 11 refills | Status: DC

## 2019-08-19 NOTE — Telephone Encounter (Signed)
-----   Message from Rise Mu, [name] sent at 08/18/2019  4:43 PM EDT ----- Labs showed elevated glucose of 375. Elevated renal function. High normal potassium.  Recommendations: -Hold Lasix and take only on an as-needed basis for weight gain greater than 3 pounds overnight, 5 pounds in a 1 week time span, increase shortness of breath, or increased lower extremity swelling.  This will help prevent significant dehydration with the recent addition of Farxiga. -He is not on added potassium supplementation. -Hold losartan for 2 days followed by resumption thereafter at current dose 12.5 mg daily.  This is in an effort to allow for return of normal renal function and to prevent significantly elevated potassium level while his kidneys get some rest from diuresis. -Please ensure he is not using salt seasonings/substitutes that are high in potassium. -Recheck BMET in 1 week after restarting losartan as outlined above.

## 2019-08-19 NOTE — Telephone Encounter (Signed)
Call to patient to review labs.    Pt verbalized understanding and has no further questions at this time.   Orders placed as requested. Pt cooperative with POC.   Advised pt to call for any further questions or concerns.

## 2019-08-21 ENCOUNTER — Telehealth: Payer: Self-pay | Admitting: Physician Assistant

## 2019-08-21 MED ORDER — METOPROLOL TARTRATE 50 MG PO TABS: 75.0000 mg | ORAL_TABLET | Freq: Two times a day (BID) | ORAL | 3 refills | Status: DC

## 2019-08-21 NOTE — Telephone Encounter (Signed)
Please call regarding Metoprolol doseage. Insurance will only pay for 25 mg or 50 mg

## 2019-08-21 NOTE — Telephone Encounter (Signed)
Returned call to [facility] to inquire about call. She reported that insurance will not cover 75 mg table. Gave verbal okay to fill for 1.5 tabs (75 mg total) twice daily.   Call to patient to make him aware of change. Pt verbalized understanding.   Advised pt to call for any further questions or concerns.   Epic updated.

## 2019-08-22 ENCOUNTER — Other Ambulatory Visit: Payer: Self-pay | Admitting: Pharmacy Technician

## 2019-08-22 NOTE — Patient Outreach (Signed)
[facility]) Care Management  08/22/2019  JOVANI LABER Sr. 12/25/1945 [id number]    Successful call placed to patient regarding patient assistance medication delivery of Farxiga with [employer], HIPAA identifiers verified.   Patient informed he received a 90 days supply of medication from the patient assistance company. Discussed refill procedure with patient which requires patient to call the company for his request. Informed patient to call when he has approximately 2 week supply remaining to avoid a delay in therapy and also informed him where to find the phone number on the pharmacy label. Patient verbalized understanding.  Confirmed patient had name and number as patient had no other questions or concerns.  Follow up:  Will route note to embedded Arlee for case closure.  Analena Gama P. Omarion Minnehan, [place]  [phone]

## 2019-08-26 ENCOUNTER — Other Ambulatory Visit
Admission: RE | Admit: 2019-08-26 | Discharge: 2019-08-26 | Disposition: A | Discharge status: Home / Self Care | Payer: Medicare Other | Source: Ambulatory Visit | Attending: Physician Assistant | Admitting: Physician Assistant

## 2019-08-26 DIAGNOSIS — I2510 Atherosclerotic heart disease of native coronary artery without angina pectoris: Secondary | ICD-10-CM | POA: Diagnosis not present

## 2019-08-26 LAB — BASIC METABOLIC PANEL
Anion gap: 7 (ref 5–15)
BUN: 17 mg/dL (ref 8–23)
CO2: 24 mmol/L (ref 22–32)
Calcium: 8.7 mg/dL — ABNORMAL LOW (ref 8.9–10.3)
Chloride: 109 mmol/L (ref 98–111)
Creatinine, Ser: 1.09 mg/dL (ref 0.61–1.24)
GFR calc Af Amer: >60 mL/min (ref >60)
GFR calc non Af Amer: >60 mL/min (ref >60)
Glucose, Bld: 285 mg/dL — ABNORMAL HIGH (ref 70–99)
Potassium: 4.5 mmol/L (ref 3.5–5.1)
Sodium: 140 mmol/L (ref 135–145)

## 2019-08-28 ENCOUNTER — Telehealth: Payer: Self-pay

## 2019-08-28 NOTE — Telephone Encounter (Signed)
Call returned by patient to review labs.    Pt verbalized understanding and has no further questions at this time.    Advised pt to call for any further questions or concerns.  No further orders.

## 2019-08-28 NOTE — Telephone Encounter (Signed)
Call attempted. No answer, no vm.

## 2019-08-28 NOTE — Telephone Encounter (Signed)
-----   Message from Rise Mu, [place] sent at 08/26/2019  3:02 PM EDT ----- Philip [name] news! Renal function has improved and is back to his approximate baseline. Potassium has improved and is normal. Random glucose remains elevated with known diabetes followed by PCP. Continue current dose losartan and Lasix on an as-needed basis, to be taken for increased shortness of breath, weight gain greater than 3 pounds overnight or 5 pounds in a 1 week time span.

## 2019-09-05 ENCOUNTER — Ambulatory Visit (INDEPENDENT_AMBULATORY_CARE_PROVIDER_SITE_OTHER): Payer: Medicare Other | Admitting: Pharmacist

## 2019-09-05 ENCOUNTER — Telehealth: Payer: Self-pay

## 2019-09-05 DIAGNOSIS — N182 Chronic kidney disease, stage 2 (mild): Secondary | ICD-10-CM | POA: Diagnosis not present

## 2019-09-05 DIAGNOSIS — E1169 Type 2 diabetes mellitus with other specified complication: Secondary | ICD-10-CM | POA: Diagnosis not present

## 2019-09-05 DIAGNOSIS — J432 Centrilobular emphysema: Secondary | ICD-10-CM | POA: Diagnosis not present

## 2019-09-05 DIAGNOSIS — I129 Hypertensive chronic kidney disease with stage 1 through stage 4 chronic kidney disease, or unspecified chronic kidney disease: Secondary | ICD-10-CM

## 2019-09-05 DIAGNOSIS — I5022 Chronic systolic (congestive) heart failure: Secondary | ICD-10-CM | POA: Diagnosis not present

## 2019-09-05 DIAGNOSIS — E785 Hyperlipidemia, unspecified: Secondary | ICD-10-CM | POA: Diagnosis not present

## 2019-09-05 NOTE — Patient Instructions (Signed)
Thank you allowing the Chronic Care Management Team to be a part of your care! It was a pleasure speaking with you today!     CCM (Chronic Care Management) Team    Noreene Larsson RN, MSN, CCM Nurse Care Coordinator  [phone]   [name] PharmD  Clinical Pharmacist  [phone]   Eula Fried LCSW Clinical Social Worker [phone]  Visit Information  Goals Addressed            This Visit's Progress   . PharmD - Medication Management       CARE PLAN ENTRY (see longitudinal plan of care for additional care plan information)  Current Barriers:  [name] Knowledge deficits related to dietary choices to improve blood sugar control . Financial - currently in coverage gap of Medicare Part D plan  Pharmacist Clinical Goal(s):  [name] Over the next 30 days, patient will work with CM Pharmacist to address needs related to medication regimen optimization.  Interventions: . Receive InBasket message from [place] advising patient approved for patient assistance program for Farxiga through [employer] through 04/30/2020, medication received and patient counseled on procedure for ordering refill. . Perform chart review . Follow up regarding blood sugar control and monitoring o Reports currently taking  metformin 500 mg - 2 tablets (1,000 mg) twice daily as directed  Farxiga 5 mg once daily  Note patient continues on prednisone 5 mg once daily as directed by pulmonologist o Review recent blood sugar results (see below)  Patient attributes elevated reading from last night to eating large portion of fried shrimp . Counsel patient on importance of well-balanced diet and limiting carbohydrate portions sizes . Discuss blood pressure/ HR monitoring and control o Reports taking:  Losartan 25 mg - 1/2 tablet (12.5 mg) once daily  Metoprolol 75 mg twice daily  Furosemide ONLY as needed as directed by Cardiologist o Review recent BP results (see below)  Reports recent  episodes of "weakness in legs" occasionally when exerting himself (walking his dog uphill) and found BP readings on 5/5: 89/67 and on 5/3: 97/76. Denies recording HR at these times, but believes was in 70s-80s range.   Encourage patient to follow up with Cardiologist regarding these episodes  Encourage patient to be cautious with weakness to avoid fall . Counsel patient on importance of continuing to weigh himself daily and follow directions from Cardiologist for use of PRN furosemide (Lasix).  o Patient confirms weighing daily and denies any recent weight gain . Counsel patient on importance of contacting Cardiologist for BP and HR readings outside of established parameters . Discuss importance of medication adherence o Confirms using maintenance inhaler (Symbicort), rescue inhaler (albuterol), in addition to cetirzine, fluticasone nasal spray, prednisone and montelukast as directed by Pulmonologist. . Will collaborate with Cardiologist via [employer] regarding report of recent episodes of weakness/hypotension  Patient Self Care Activities:  . Self administers medications as prescribed . Attends all scheduled provider appointments . Calls pharmacy for medication refills . Calls provider office for new concerns or questions  . Patient to check blood sugar regularly as directed and keep log  Fasting Blood Glucose After Supper  (~2 hours) Notes  1 - May     2 - May  324   3 - May 154 271   4 - May 132 246   5 - May 139 261   6 - May 148 407* *Fried Shrimp for supper  7 - May 146    Average 144 302    .  Patient to keep log when checks blood pressure  AM BP PM BP Notes  1 - May - -   2 - May - 120/85, HR 83   3 - May 129/89, HR 80 131/81, HR 72 Reports other reading of 97/76  4 - May 122/83, HR 77 119/92, HR 89   5 - May 136/99, HR 83 120/88, HR 82 Reports other reading of 89/67  6 - May 116/79, HR 82 120/72, HR 79   7 - May 144/98, HR 79       Please see past updates  related to this goal by clicking on the "Past Updates" button in the selected goal         Patient verbalizes understanding of instructions provided today.   Telephone follow up appointment with care management team member scheduled for: 6/2 at 9:30 am  [name], PharmD, [place] [phone]

## 2019-09-05 NOTE — Telephone Encounter (Signed)
Pt coming in next Friday with Philip walker, np after u/s on Thursday.

## 2019-09-05 NOTE — Telephone Encounter (Signed)
-----   Message from Rise Mu, [name] sent at 09/05/2019  2:13 PM EDT ----- Please schedule patient to be seen next week with APP or primary cardiologist. ----- Message ----- From: Vella Raring, [facility] Sent: 09/05/2019   1:57 PM EDT To: Rise Mu, [name]  Philip [name],  I am following up with you regarding Philip [name]'s BP readings. Patient has continued to check BP/HR twice daily (included in note) and most readings look good. However, also reported recent episodes of "weakness in legs" occasionally when exerting himself (walking his dog uphill) and when checked BP noted readings on 5/5: 89/67 and on 5/3: 97/76. Patient denies having recorded HR at these times, but believes was in 70s-80s range.   I encouraged patient to follow up with you himself about these episodes, but also wanted to share my note for your review of his reported readings.  Thank you! Hope that you have a good weekend!  [name], PharmD, [place] [employer] [phone]

## 2019-09-05 NOTE — Chronic Care Management (AMB) (Signed)
Chronic Care Management   Follow Up Note   09/05/2019 Name: Philip NAVARRETTE Sr. MRN: [id number] DOB: 1945/10/13  Referred by: [name], DO Reason for referral : Chronic Care Management (Patient Phone Call)   Philip Potters Sr. is a 74 y.o. year old male who is a primary care patient of [name], DO. The CCM team was consulted for assistance with chronic disease management and care coordination needs.    I reached out to Philip Potters Sr. by phone today.   Review of patient status, including review of consultants reports, relevant laboratory and other test results, and collaboration with appropriate care team members and the patient's provider was performed as part of comprehensive patient evaluation and provision of chronic care management services.    Outpatient Encounter Medications as of 09/05/2019  Medication Sig  . Albuterol Sulfate 108 (90 Base) MCG/ACT AEPB Inhale 2 puffs into the lungs every 6 (six) hours as needed (shortness of breath).   [name] atorvastatin (LIPITOR) 40 MG tablet Take 1 tablet by mouth once daily  . dapagliflozin propanediol (FARXIGA) 5 MG TABS tablet Take 5 mg by mouth daily before breakfast.  . fluticasone (FLONASE) 50 MCG/ACT nasal spray Place 1 spray into both nostrils daily as needed for allergies or rhinitis.  . furosemide (LASIX) 40 MG tablet Take 0.5-1 tablets (20-40 mg total) by mouth daily as needed for edema. Only as needed now.  . losartan (COZAAR) 25 MG tablet Take 0.5 tablets (12.5 mg total) by mouth daily.  . metFORMIN (GLUCOPHAGE) 500 MG tablet TAKE 2 TABLETS BY MOUTH TWICE DAILY WITH A MEAL (Patient taking differently: Take 1,000 mg by mouth 2 (two) times daily with a meal. )  . metoprolol tartrate (LOPRESSOR) 50 MG tablet Take 1.5 tablets (75 mg total) by mouth 2 (two) times daily.  . montelukast (SINGULAIR) 10 MG tablet Take 10 mg by mouth at bedtime.  . predniSONE (DELTASONE) 5 MG tablet Take 5 mg by mouth daily with  breakfast.  . SYMBICORT 160-4.5 MCG/ACT inhaler Inhale 2 puffs 2 (two) times daily into the lungs.   . Artificial Tear Ointment (DRY EYES OP) Place 1 drop into both eyes daily as needed (for dry eyes).  . Blood Glucose Monitoring Suppl (ONE TOUCH ULTRA 2) w/Device KIT Use to check blood sugar as advised up to twice a day  . cetirizine (ZYRTEC) 10 MG tablet Take 10 mg by mouth daily.   . diclofenac sodium (VOLTAREN) 1 % GEL Apply 2 g topically 3 (three) times daily as needed (Use on elbow bursitis and knee).  [name] ELIQUIS 5 MG TABS tablet Take 1 tablet by mouth twice daily  . Ensure Max Protein (ENSURE MAX PROTEIN) LIQD Take 330 mLs (11 oz total) by mouth 2 (two) times daily.  [name] gabapentin (NEURONTIN) 100 MG capsule TAKE 1 CAPSULE BY MOUTH THREE TIMES DAILY  . guaiFENesin (MUCINEX) 600 MG 12 hr tablet Take 600 mg by mouth 2 (two) times daily.   [name] ipratropium-albuterol (DUONEB) 0.5-2.5 (3) MG/3ML SOLN Take 3 mLs by nebulization 4 (four) times daily.  [name] LANCETS ULTRA THIN MISC 1 Device by Does not apply route 4 (four) times daily -  before meals and at bedtime.  [name] levothyroxine (SYNTHROID) 88 MCG tablet Take 1 tablet by mouth once daily  . ONETOUCH ULTRA test strip USE TEST STRIP(S) TO CHECK GLUCOSE TWICE DAILY  . [DISCONTINUED] predniSONE (DELTASONE) 20 MG tablet Take daily with food. Start with 88m (3 pills) x  2 days, then reduce to 21m (2 pills) x 2 days, then 235m(1 pill) x 3 days   No facility-administered encounter medications on file as of 09/05/2019.    Goals Addressed            This Visit's Progress   . PharmD - Medication Management       CARE PLAN ENTRY (see longitudinal plan of care for additional care plan information)  Current Barriers:  . Marland Kitchennowledge deficits related to dietary choices to improve blood sugar control . Financial - currently in coverage gap of Medicare Part D plan  Pharmacist Clinical Goal(s):  . Marland Kitchenver the next 30 days, patient will work with CM Pharmacist to  address needs related to medication regimen optimization.  Interventions: . Receive [place] message from [place] advising patient approved for patient assistance program for Farxiga through [employer] through 04/30/2020, medication received and patient counseled on procedure for ordering refill. . Perform chart review o Note from repeat lab work on 4/27, Scr improved (1.33 to 1.09)  Cardiologist advised patient to "continue current dose losartan and Lasix on an as-needed basis, to be taken for increased shortness of breath, weight gain greater than 3 pounds overnight or 5 pounds in a 1 week time span." . Follow up regarding blood sugar control and monitoring o Reports currently taking  metformin 500 mg - 2 tablets (1,000 mg) twice daily as directed  Farxiga 5 mg once daily  Note patient continues on prednisone 5 mg once daily as directed by pulmonologist o Review recent blood sugar results (see below)  Patient attributes elevated reading from last night to eating large portion of fried shrimp . Counsel patient on importance of well-balanced diet and limiting carbohydrate portions sizes . Discuss blood pressure/ HR monitoring and control o Reports taking:  Losartan 25 mg - 1/2 tablet (12.5 mg) once daily  Metoprolol 75 mg twice daily  Furosemide ONLY as needed as directed by Cardiologist o Review recent BP results (see below)  Reports recent episodes of "weakness in legs" occasionally when exerting himself (walking his dog uphill) and found BP readings on 5/5: 89/67 and on 5/3: 97/76. Denies recording HR at these times, but believes was in 70s-80s range.   Encourage patient to follow up with Cardiologist regarding these episodes  Encourage patient to be cautious with weakness to avoid fall . Counsel patient on importance of continuing to weigh himself daily and follow directions from Cardiologist for use of PRN furosemide (Lasix).  o Patient confirms weighing daily and  denies any recent weight gain . Counsel patient on importance of contacting Cardiologist for BP and HR readings outside of established parameters . Discuss importance of medication adherence o Confirms using maintenance inhaler (Symbicort), rescue inhaler (albuterol), in addition to cetirzine, fluticasone nasal spray, prednisone and montelukast as directed by Pulmonologist. . Will collaborate with Cardiologist via [employer]arding report of recent episodes of weakness/hypotension  Patient Self Care Activities:  . Self administers medications as prescribed . Attends all scheduled provider appointments . Calls pharmacy for medication refills . Calls provider office for new concerns or questions  . Patient to check blood sugar regularly as directed and keep log  Fasting Blood Glucose After Supper  (~2 hours) Notes  1 - May     2 - May  324   3 - May 154 271   4 - May 132 246   5 - May 139 261   6 - May 148 [place]  Shrimp for supper  7 - May 146    Average 144 302    . Patient to keep log when checks blood pressure  AM BP PM BP Notes  1 - May - -   2 - May - 120/85, HR 83   3 - May 129/89, HR 80 131/81, HR 72 Reports other reading of 97/76  4 - May 122/83, HR 77 119/92, HR 89   5 - May 136/99, HR 83 120/88, HR 82 Reports other reading of 89/67  6 - May 116/79, HR 82 120/72, HR 79   7 - May 144/98, HR 79       Please see past updates related to this goal by clicking on the "Past Updates" button in the selected goal         Plan  Telephone follow up appointment with care management team member scheduled for: 6/2 at 9:30 am  [name], PharmD, [place] [phone]

## 2019-09-11 ENCOUNTER — Ambulatory Visit (INDEPENDENT_AMBULATORY_CARE_PROVIDER_SITE_OTHER): Payer: Medicare Other

## 2019-09-11 ENCOUNTER — Other Ambulatory Visit: Payer: Self-pay

## 2019-09-11 DIAGNOSIS — M5441 Lumbago with sciatica, right side: Secondary | ICD-10-CM | POA: Diagnosis not present

## 2019-09-11 DIAGNOSIS — I5020 Unspecified systolic (congestive) heart failure: Secondary | ICD-10-CM | POA: Diagnosis not present

## 2019-09-11 DIAGNOSIS — M62838 Other muscle spasm: Secondary | ICD-10-CM | POA: Diagnosis not present

## 2019-09-12 ENCOUNTER — Encounter: Payer: Self-pay | Admitting: Family

## 2019-09-12 ENCOUNTER — Ambulatory Visit (INDEPENDENT_AMBULATORY_CARE_PROVIDER_SITE_OTHER): Payer: Medicare Other | Admitting: Family

## 2019-09-12 VITALS — BP 136/86 | HR 76 | Ht 71.0 in | Wt 208.4 lb

## 2019-09-12 DIAGNOSIS — I4819 Other persistent atrial fibrillation: Secondary | ICD-10-CM

## 2019-09-12 DIAGNOSIS — E1169 Type 2 diabetes mellitus with other specified complication: Secondary | ICD-10-CM

## 2019-09-12 DIAGNOSIS — E785 Hyperlipidemia, unspecified: Secondary | ICD-10-CM

## 2019-09-12 DIAGNOSIS — I25810 Atherosclerosis of coronary artery bypass graft(s) without angina pectoris: Secondary | ICD-10-CM | POA: Diagnosis not present

## 2019-09-12 DIAGNOSIS — I5022 Chronic systolic (congestive) heart failure: Secondary | ICD-10-CM

## 2019-09-12 DIAGNOSIS — Z7901 Long term (current) use of anticoagulants: Secondary | ICD-10-CM | POA: Diagnosis not present

## 2019-09-12 NOTE — Progress Notes (Signed)
Office Visit    Patient Name: Philip SCHWAN Sr. Date of Encounter: 09/12/2019  Primary Care Provider:  [name], DO Primary Cardiologist:  [name], MD Electrophysiologist:  None   Chief Complaint    Philip Potters Sr. is a 74 y.o. male with a hx of  CAD s/p CABG X3 05/2012 with LIMA to LAD, SVG to LCx, SVG to RPDA, HFrEF, persistent atrial fibrillation on Eliquis, ectopic atrial tachycardia, diastolic dysfunction, DM2, HLD, HTN, CAD presents today for low blood pressures.   Past Medical History    Past Medical History:  Diagnosis Date  . Arthritis    a. right knee  . Asthma   . Atrial tachycardia ([place])   . 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 etiology.  [name] COPD (chronic obstructive pulmonary disease) ([place])   . Coronary artery disease    a. 05/2012 Cath: severe 3VD-->CABG x 4 by Dr. Servando Snare in 1/2014[place], SVG->LCX & SVG->RPDA; b. 06/2014 MV: no ischemia/infarct.  . Diastolic dysfunction    a. 06/2015 Echo: EF 50-55%, Gr1 DD. Mild MR. Mildly dil LA. Nl RV fxn. Nl PASP.  [name] Edema    LEGS/ FEET  . Essential hypertension   . History of bronchitis   . HOH (hard of hearing)   . Hypercholesteremia   . Neuropathy   . PAF (paroxysmal atrial fibrillation) ([facility])    a. CHA2DS2VASc = 4-->Eliquis.  . Shingles   . Sleep apnea    NO CPAP  . Type II diabetes mellitus ([place])    Past Surgical History:  Procedure Laterality Date  . ANTERIOR CERVICAL DECOMP/DISCECTOMY FUSION  ~ 2009  . BACK SURGERY     CERVICAL FUSION  . CARDIAC CATHETERIZATION  05/06/2012   Significant three-vessel coronary artery disease with normal ejection fraction  . CATARACT EXTRACTION W/PHACO Right 08/15/2017   Procedure: CATARACT EXTRACTION PHACO AND INTRAOCULAR LENS PLACEMENT (IOC);  Surgeon: Birder Robson, MD;  Location: [facility];  Service: Ophthalmology;  Laterality: Right;  [place] 00:23 AP% 13.3 CDE 3.11 Fluid pack lot # [id number]  . CATARACT EXTRACTION W/PHACO Left 10/03/2017    Procedure: CATARACT EXTRACTION PHACO AND INTRAOCULAR LENS PLACEMENT (IOC);  Surgeon: Birder Robson, MD;  Location: [facility];  Service: Ophthalmology;  Laterality: Left;  Lot #[id number] H [place]: 00:21.2 AP%:13.9 CDE: 2.96  . CORONARY ANGIOPLASTY    . CORONARY ARTERY BYPASS GRAFT  05/07/2012   Procedure: CORONARY ARTERY BYPASS GRAFTING (CABG);  Surgeon: [name], MD;  Location: [place];  Service: Open Heart Surgery;  Laterality: N/A;  times three  . INTRAOPERATIVE TRANSESOPHAGEAL ECHOCARDIOGRAM  05/07/2012   Procedure: INTRAOPERATIVE TRANSESOPHAGEAL ECHOCARDIOGRAM;  Surgeon: [name], MD;  Location: [place];  Service: Open Heart Surgery;  Laterality: N/A;  . KNEE ARTHROSCOPY  ~ 2000   "right" (05/06/2012)  . KNEE SURGERY  2003   right  . NECK SURGERY  2008   Plate in neck  . TEE WITHOUT CARDIOVERSION N/A 07/04/2019   Procedure: TRANSESOPHAGEAL ECHOCARDIOGRAM (TEE);  Surgeon: Nelva Bush, MD;  Location: [facility];  Service: Cardiovascular;  Laterality: N/A;    Allergies  Allergies  Allergen Reactions  . Bydureon [Exenatide] Anaphylaxis    Possible from bydureon, not 100% confirmed  . Clindamycin/Lincomycin Hives and Itching  . Penicillins Hives, Itching, Rash and Other (See Comments)    Has patient had a PCN reaction causing immediate rash, facial/tongue/throat swelling, SOB or lightheadedness with hypotension: Yes Has patient had a PCN reaction causing  severe rash involving mucus membranes or skin necrosis: No Has patient had a PCN reaction that required hospitalization: No Has patient had a PCN reaction occurring within the last 10 years: No If all of the above answers are "NO", then may proceed with Cephalosporin use.  . Sulfa Antibiotics Shortness Of Breath  . Spiriferis Other (See Comments)    Affected breathing  . Spiriva Handihaler [Tiotropium Bromide Monohydrate]     Affected breathing  . Ivp Dye [Iodinated Diagnostic Agents] Rash and Other (See Comments)    Redness     History of Present Illness    Philip Passe. is a 74 y.o. male with a hx of CAD s/p CABG X3 05/2012 with LIMA to LAD, SVG to LCx, SVG to RPDA, HFrEF, persistent atrial fibrillation on Eliquis, ectopic atrial tachycardia, diastolic dysfunction, DM2, HLD, HTN, CAD.  He was last seen 08/04/2019 by Christell Faith, PA.  Cardiac cath 05/2012 severe three-vessel CAD recommended for CABG.  Most recent Myoview 06/2014 evidence of ischemia.  Echo 06/2015 with LVEF 50-50%, GR 1 DD, mild MR, mildly dilated LA, normal RV systolic function, normal PASP.  Atrial fibrillation and on chronic Eliquis.  He was previously on amiodarone but this was discontinued 11/2016 secondary concern for related side effects.  He has not previously been interested in catheter ablation.  Admitted 06/2019 with sepsis secondary to pneumonia and MSSA bacteremia complicated by atrial flutter with RVR.  06/21/2019 LVEF 40 to 25%, global hypokinesis, moderately dilated LV, mildly reduced RV systolic function with mildly large RV cavity size, moderately elevated PASP, mild biatrial management, mild to moderate MR.  TEE 3/\21 with LVEF 30 to 35%, global hypokinesis, mildly reduced RV systolic abdominal aorta RV cavity size, no left atrial appendage thrombus detected, mild to moderate MR, trileaflet AV with mild sclerosis without stenosis, mild plaque involving descending aorta, no evidence of vegetations/infected myocarditis  Seen by cardiology for hospital follow-up 07/09/2019 feeling well.  Remains in rate controlled atrial fibrillation with consideration for DCCV down the road.  Given cardiomyopathy underwent Lexiscan MPI 07/28/2019 with no evidence of ischemia, EF 63%, low risk study.  08/19/18 labs with elevated renal function and high normal potassium. His Lasix was changed to as-needed and Losartan was held for 2 days. Repeat labs 1 week later with normal renal function and potassium.   At last clinic visit 08/04/19 he was feeling well. His Lasix  was decreased to 45m every other day as FWilder Gladewas being added the next day to prevent dehydration or hypotension.   He is followed by pharmacy for chronic care management. Noted episodes of "weakness in legs" when exerting such as walking dog up hill. His BP readings at that time weree 89/67 and 97/76.   He had echocardiogram 09/12/19 with LVEF 40-45%, global LV hypokinesis, indeterminite LV diastolic parameters, RV normal size and function, LA moderately dilated, moderate MR, mild-moderate TR. The study was reviewed in depth during office visit.  Reports no shortness of breath at rest and reports stable dyspnea on exertion.  Notices dyspnea only when walking up a large hill with his dog.  Reports no chest pain, pressure, or tightness. No edema, orthopnea, PND. Reports no palpitations, irregular heart beats. Is unaware of his atrial fibrillation.   He keeps a very careful log of his blood pressure, heart rate, blood sugar.  He is tolerating the addition of FI[place]. Reports he has not had to take his PRN Lasix.  He reports he does not feel dehydrated.  He did have one day at the end of April and one day beginning of May that he felt "weak in his legs" and checked BP midday with low readings. No lightheadedness, dizziness, near syncope. Remainder of BP 110s-120s/70s-80s.    EKGs/Labs/Other Studies Reviewed:   The following studies were reviewed today:  Echo 09/11/19  1. Left ventricular ejection fraction, by estimation, is 40 to 45%. The  left ventricle has mildly decreased function. The left ventricle  demonstrates global hypokinesis. Left ventricular diastolic parameters are  indeterminate.   2. Right ventricular systolic function is normal. The right ventricular  size is normal. Tricuspid regurgitation signal is inadequate for assessing  PA pressure.   3. Left atrial size was moderately dilated.   4. The mitral valve is normal in structure. Moderate mitral valve  regurgitation. No  evidence of mitral stenosis.   5. Tricuspid valve regurgitation is mild to moderate   EKG:  EKG is ordered today.  The ekg ordered today demonstrates rate controlled atrial fib 68 bpm.   Recent Labs: 06/30/2019: B Natriuretic Peptide 857.0; Magnesium 2.3; TSH 2.676 07/11/2019: ALT 8; Hemoglobin 13.6; Platelets 306 08/26/2019: BUN 17; Creatinine, Ser 1.09; Potassium 4.5; Sodium 140  Recent Lipid Panel    Component Value Date/Time   CHOL 125 06/24/2018 0846   CHOL 139 12/14/2014 0834   TRIG 97 06/24/2018 0846   HDL 46 06/24/2018 0846   HDL 43 12/14/2014 0834   CHOLHDL 2.7 06/24/2018 0846   LDLCALC 61 06/24/2018 0846    Home Medications   Current Meds  Medication Sig  . Albuterol Sulfate 108 (90 Base) MCG/ACT AEPB Inhale 2 puffs into the lungs every 6 (six) hours as needed (shortness of breath).   . Artificial Tear Ointment (DRY EYES OP) Place 1 drop into both eyes daily as needed (for dry eyes).  [name] atorvastatin (LIPITOR) 40 MG tablet Take 1 tablet by mouth once daily  . Blood Glucose Monitoring Suppl (ONE TOUCH ULTRA 2) w/Device KIT Use to check blood sugar as advised up to twice a day  . cetirizine (ZYRTEC) 10 MG tablet Take 10 mg by mouth daily.   . cyclobenzaprine (FLEXERIL) 10 MG tablet Take 10 mg by mouth 3 (three) times daily.  . dapagliflozin propanediol (FARXIGA) 5 MG TABS tablet Take 5 mg by mouth daily before breakfast.  . diclofenac sodium (VOLTAREN) 1 % GEL Apply 2 g topically 3 (three) times daily as needed (Use on elbow bursitis and knee).  [name] ELIQUIS 5 MG TABS tablet Take 1 tablet by mouth twice daily  . Ensure Max Protein (ENSURE MAX PROTEIN) LIQD Take 330 mLs (11 oz total) by mouth 2 (two) times daily.  . fluticasone (FLONASE) 50 MCG/ACT nasal spray Place 1 spray into both nostrils daily as needed for allergies or rhinitis.  . furosemide (LASIX) 40 MG tablet Take 0.5-1 tablets (20-40 mg total) by mouth daily as needed for edema. Only as needed now.  . gabapentin  (NEURONTIN) 100 MG capsule TAKE 1 CAPSULE BY MOUTH THREE TIMES DAILY  . guaiFENesin (MUCINEX) 600 MG 12 hr tablet Take 600 mg by mouth 2 (two) times daily.   [name] ipratropium-albuterol (DUONEB) 0.5-2.5 (3) MG/3ML SOLN Take 3 mLs by nebulization 4 (four) times daily.  [name] LANCETS ULTRA THIN MISC 1 Device by Does not apply route 4 (four) times daily -  before meals and at bedtime.  [name] levothyroxine (SYNTHROID) 88 MCG tablet Take 1 tablet by mouth once daily  . losartan (COZAAR) 25 MG  tablet Take 0.5 tablets (12.5 mg total) by mouth daily.  . metFORMIN (GLUCOPHAGE) 500 MG tablet TAKE 2 TABLETS BY MOUTH TWICE DAILY WITH A MEAL (Patient taking differently: Take 1,000 mg by mouth 2 (two) times daily with a meal. )  . methylPREDNISolone (MEDROL) 4 MG tablet TAKE 6 TABLETS ON DAY 1 THEN TAKE 5 TABLETS ON DAY 2 THEN TAKE 4 TABLETS ON DAY 3 THEN TAKE 3 TABLETS ON DAY 4 THEN TAKE 2 TABLETS ON DAY 5  . metoprolol tartrate (LOPRESSOR) 50 MG tablet Take 1.5 tablets (75 mg total) by mouth 2 (two) times daily.  . montelukast (SINGULAIR) 10 MG tablet Take 10 mg by mouth at bedtime.  Glory Rosebush ULTRA test strip USE TEST STRIP(S) TO CHECK GLUCOSE TWICE DAILY  . predniSONE (DELTASONE) 5 MG tablet Take 5 mg by mouth daily with breakfast.  . SYMBICORT 160-4.5 MCG/ACT inhaler Inhale 2 puffs 2 (two) times daily into the lungs.     Review of Systems    Review of Systems  Constitution: Negative for chills, fever and malaise/fatigue.  Cardiovascular: Positive for dyspnea on exertion. Negative for chest pain, irregular heartbeat, leg swelling, near-syncope, orthopnea, palpitations and syncope.  Respiratory: Negative for cough, shortness of breath and wheezing.   Gastrointestinal: Negative for melena, nausea and vomiting.  Genitourinary: Negative for hematuria.  Neurological: Negative for dizziness, light-headedness and weakness.   All other systems reviewed and are otherwise negative except as noted above.  Physical Exam     VS:  BP 136/86   Pulse 76   Ht 5' 11"  (1.803 m)   Wt 208 lb 6.4 oz (94.5 kg)   SpO2 96%   BMI 29.07 kg/m  , BMI Body mass index is 29.07 kg/m. GEN: Well nourished, well developed, in no acute distress. HEENT: normal. Neck: Supple, no JVD, carotid bruits, or masses. Cardiac: irregularly irregular, no murmurs, rubs, or gallops. No clubbing, cyanosis, edema.  Radials/DP/PT 2+ and equal bilaterally.  Respiratory:  Respirations regular and unlabored, clear to auscultation bilaterally. GI: Soft, nontender, nondistended, BS + x 4. MS: No deformity or atrophy. Skin: Warm and dry, no rash. Neuro:  Strength and sensation are intact. Psych: Normal affect.  Assessment & Plan    1. CAD s/p CABG - Stable with no anginal symptoms. EKG no acute ST/T wave changes. No indication for ischemic evaluation at this time. GDMT includes beta blocker and statin. No aspirin secondary to chronic anticoagulation.   2. 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 Euvolemic and well controlled on exam today. Previous TEE 07/04/19 LVEF 30-35%.  Echo yesterday LVEF 40-45%, global hypokinesis, normal LV size, RV normal size and function. NYHA I-II with DOE. Offered cardiac rehab referral which he politely declined. GDMT includes Metoprolol (no Coreg secondary to low normal BP), Lasix 43m PRN, Dapagliflozin, Losartan 12.513mdaily. Due to recent episodes of hypotension, will not add MRA or [facility] time as I do not believe his BP will tolerate though could be considered in the future. Continue low sodium diet. Continue regular walking regimen.   3. Persistent atrial fib - Reports on palpitations, irregular heart beats. Overall asymptomatic. Rate controlled by EKG today and home monitoring. As his EF improved from 30-35% on 07/04/19 to 40-45% by echo yesterday (which was previous baseline from 06/29/19) appropriate to continue rate control. He is hesitant regarding rhythm control. Continue  Metoprolol 75 mg BID.   4. Chronic anticoagulation-secondary PAF and CHA2DS2-VASc of at least 4 (age, HF, HTN,  CAD). Denies bleeding complications. 07/11/19 Hb 13.6. Continue Eliquis 80m BID.   5. HTN- BP well controlled. 2 isolated episodes of hypotension not associated with lightheadedness, dizziness. Educated to report SBP <100. If BP low, educated to hydrate.   6. HLD- LDL goal <70. Lipid panel 06/24/18 with LDL 61. Due for lipid panel, but not fasting. Plan to collect 1 week prior to follow up. Continue Atorvastatin 439mdaily.   7. COPD- Contributory to DOWest WendoverContinue to follow with PCP.   8. DM2- Appreciate inclusion of dapagliflozin for heart failure benefit.   Disposition: Follow up in 2 month(s) with Dr. ArFletcher Anonr APP   CaLoel DubonnetNP 09/12/2019, 1:57 PM

## 2019-09-12 NOTE — Patient Instructions (Signed)
Medication Instructions:  No medication changes today.   If you gain 3lb overnight or 5lbs in 1 week, take Lasix.   *If you need a refill on your cardiac medications before your next appointment, please call your pharmacy*  Lab Work: None ordered today.   Testing/Procedures: Your EKG today shows rate controlled atrial fibrillation.   Your echocardiogram showed your pumping function had improvement from 30-35% to 40-45%. (Normal is 50-65%). Your mitral and tricuspid valves were stable with some mild leakiness. We control this by keeping your blood pressure and volume (fluid overload) well controlled.   Follow-Up: At [facility], you and your health needs are our priority.  As part of our continuing mission to provide you with exceptional heart care, we have created designated Provider Care Teams.  These Care Teams include your primary Cardiologist (physician) and Advanced Practice Providers (APPs -  Physician Assistants and Nurse Practitioners) who all work together to provide you with the care you need, when you need it.  We recommend signing up for the patient portal called "MyChart".  Sign up information is provided on this After Visit Summary.  MyChart is used to connect with patients for Virtual Visits (Telemedicine).  Patients are able to view lab/test results, encounter notes, upcoming appointments, etc.  Non-urgent messages can be sent to your provider as well.   To learn more about what you can do with MyChart, go to [email or web address].    Your next appointment:   2-3  month(s)  The format for your next appointment:   In Person  Provider:    You may see [name], MD or one of the following Advanced Practice Providers on your designated Care Team:    Murray Hodgkins, NP  Christell Faith, [name]  Marrianne Mood, [name]  Laurann Montana, NP  Other Instructions  If you have a day that you get those low blood pressures, recommend drinking extra water and eating a  bit of salt. Those low blood pressures are likely due to dehydration. If you routinely are getting systolic blood pressures (the top number) less than 110, please call our office.   You may also wear compression stockings.   If you decide you want to participate in cardiac rehab simply call our office and we can place a referral.

## 2019-09-16 ENCOUNTER — Other Ambulatory Visit: Payer: Medicare Other

## 2019-09-17 DIAGNOSIS — M9903 Segmental and somatic dysfunction of lumbar region: Secondary | ICD-10-CM | POA: Diagnosis not present

## 2019-09-17 DIAGNOSIS — M5431 Sciatica, right side: Secondary | ICD-10-CM | POA: Diagnosis not present

## 2019-09-17 DIAGNOSIS — M9905 Segmental and somatic dysfunction of pelvic region: Secondary | ICD-10-CM | POA: Diagnosis not present

## 2019-09-17 DIAGNOSIS — M4306 Spondylolysis, lumbar region: Secondary | ICD-10-CM | POA: Diagnosis not present

## 2019-09-18 DIAGNOSIS — M9903 Segmental and somatic dysfunction of lumbar region: Secondary | ICD-10-CM | POA: Diagnosis not present

## 2019-09-18 DIAGNOSIS — M9905 Segmental and somatic dysfunction of pelvic region: Secondary | ICD-10-CM | POA: Diagnosis not present

## 2019-09-18 DIAGNOSIS — M4306 Spondylolysis, lumbar region: Secondary | ICD-10-CM | POA: Diagnosis not present

## 2019-09-18 DIAGNOSIS — M5431 Sciatica, right side: Secondary | ICD-10-CM | POA: Diagnosis not present

## 2019-09-19 DIAGNOSIS — M5431 Sciatica, right side: Secondary | ICD-10-CM | POA: Diagnosis not present

## 2019-09-19 DIAGNOSIS — M4306 Spondylolysis, lumbar region: Secondary | ICD-10-CM | POA: Diagnosis not present

## 2019-09-19 DIAGNOSIS — M9905 Segmental and somatic dysfunction of pelvic region: Secondary | ICD-10-CM | POA: Diagnosis not present

## 2019-09-19 DIAGNOSIS — M9903 Segmental and somatic dysfunction of lumbar region: Secondary | ICD-10-CM | POA: Diagnosis not present

## 2019-09-22 DIAGNOSIS — M4306 Spondylolysis, lumbar region: Secondary | ICD-10-CM | POA: Diagnosis not present

## 2019-09-22 DIAGNOSIS — M9905 Segmental and somatic dysfunction of pelvic region: Secondary | ICD-10-CM | POA: Diagnosis not present

## 2019-09-22 DIAGNOSIS — M9903 Segmental and somatic dysfunction of lumbar region: Secondary | ICD-10-CM | POA: Diagnosis not present

## 2019-09-22 DIAGNOSIS — M5431 Sciatica, right side: Secondary | ICD-10-CM | POA: Diagnosis not present

## 2019-09-23 DIAGNOSIS — M4306 Spondylolysis, lumbar region: Secondary | ICD-10-CM | POA: Diagnosis not present

## 2019-09-23 DIAGNOSIS — M5431 Sciatica, right side: Secondary | ICD-10-CM | POA: Diagnosis not present

## 2019-09-23 DIAGNOSIS — M9903 Segmental and somatic dysfunction of lumbar region: Secondary | ICD-10-CM | POA: Diagnosis not present

## 2019-09-23 DIAGNOSIS — M9905 Segmental and somatic dysfunction of pelvic region: Secondary | ICD-10-CM | POA: Diagnosis not present

## 2019-09-24 DIAGNOSIS — M9905 Segmental and somatic dysfunction of pelvic region: Secondary | ICD-10-CM | POA: Diagnosis not present

## 2019-09-24 DIAGNOSIS — M5431 Sciatica, right side: Secondary | ICD-10-CM | POA: Diagnosis not present

## 2019-09-24 DIAGNOSIS — M4306 Spondylolysis, lumbar region: Secondary | ICD-10-CM | POA: Diagnosis not present

## 2019-09-24 DIAGNOSIS — M9903 Segmental and somatic dysfunction of lumbar region: Secondary | ICD-10-CM | POA: Diagnosis not present

## 2019-09-26 DIAGNOSIS — M4306 Spondylolysis, lumbar region: Secondary | ICD-10-CM | POA: Diagnosis not present

## 2019-09-26 DIAGNOSIS — M5431 Sciatica, right side: Secondary | ICD-10-CM | POA: Diagnosis not present

## 2019-09-26 DIAGNOSIS — M9903 Segmental and somatic dysfunction of lumbar region: Secondary | ICD-10-CM | POA: Diagnosis not present

## 2019-09-26 DIAGNOSIS — M9905 Segmental and somatic dysfunction of pelvic region: Secondary | ICD-10-CM | POA: Diagnosis not present

## 2019-09-30 DIAGNOSIS — M5431 Sciatica, right side: Secondary | ICD-10-CM | POA: Diagnosis not present

## 2019-09-30 DIAGNOSIS — M9903 Segmental and somatic dysfunction of lumbar region: Secondary | ICD-10-CM | POA: Diagnosis not present

## 2019-09-30 DIAGNOSIS — M4306 Spondylolysis, lumbar region: Secondary | ICD-10-CM | POA: Diagnosis not present

## 2019-09-30 DIAGNOSIS — M9905 Segmental and somatic dysfunction of pelvic region: Secondary | ICD-10-CM | POA: Diagnosis not present

## 2019-10-01 ENCOUNTER — Ambulatory Visit (INDEPENDENT_AMBULATORY_CARE_PROVIDER_SITE_OTHER): Payer: Medicare Other | Admitting: Pharmacist

## 2019-10-01 ENCOUNTER — Other Ambulatory Visit: Payer: Self-pay | Admitting: Family Medicine

## 2019-10-01 DIAGNOSIS — E785 Hyperlipidemia, unspecified: Secondary | ICD-10-CM | POA: Diagnosis not present

## 2019-10-01 DIAGNOSIS — J432 Centrilobular emphysema: Secondary | ICD-10-CM

## 2019-10-01 DIAGNOSIS — E1169 Type 2 diabetes mellitus with other specified complication: Secondary | ICD-10-CM

## 2019-10-01 DIAGNOSIS — I129 Hypertensive chronic kidney disease with stage 1 through stage 4 chronic kidney disease, or unspecified chronic kidney disease: Secondary | ICD-10-CM

## 2019-10-01 DIAGNOSIS — M5431 Sciatica, right side: Secondary | ICD-10-CM | POA: Diagnosis not present

## 2019-10-01 DIAGNOSIS — M4306 Spondylolysis, lumbar region: Secondary | ICD-10-CM | POA: Diagnosis not present

## 2019-10-01 DIAGNOSIS — E034 Atrophy of thyroid (acquired): Secondary | ICD-10-CM

## 2019-10-01 DIAGNOSIS — M9905 Segmental and somatic dysfunction of pelvic region: Secondary | ICD-10-CM | POA: Diagnosis not present

## 2019-10-01 DIAGNOSIS — N182 Chronic kidney disease, stage 2 (mild): Secondary | ICD-10-CM

## 2019-10-01 DIAGNOSIS — M9903 Segmental and somatic dysfunction of lumbar region: Secondary | ICD-10-CM | POA: Diagnosis not present

## 2019-10-01 MED ORDER — LEVOTHYROXINE SODIUM 88 MCG PO TABS: 88.0000 ug | ORAL_TABLET | Freq: Every day | ORAL | 1 refills | Status: DC

## 2019-10-01 NOTE — Telephone Encounter (Signed)
Requested medication (s) are due for refill today: yes  Requested medication (s) are on the active medication list: medication listed as levothyroxine 88 mcg on med list  Last refill:  06/19/19  Future visit scheduled: no  Notes to clinic: please clarify if DAW or generic    Requested Prescriptions  Pending Prescriptions Disp Refills   EUTHYROX 88 MCG tablet [Pharmacy Med Name: Euthyrox 88 MCG Oral Tablet] 90 tablet 0    Sig: Take 1 tablet by mouth once daily      Endocrinology:  Hypothyroid Agents Failed - 10/01/2019 10:38 AM      Failed - TSH needs to be rechecked within 3 months after an abnormal result. Refill until TSH is due.      Passed - TSH in normal range and within 360 days    TSH  Date Value Ref Range Status  06/30/2019 2.676 0.350 - 4.500 uIU/mL Final    Comment:    Performed by a 3rd Generation assay with a functional sensitivity of <=0.01 uIU/mL. Performed at [facility], [address] [place], [place] [address]   04/17/2019 3.460 0.450 - 4.500 uIU/mL Final          Passed - Valid encounter within last 12 months    Recent Outpatient Visits           1 month ago Acute idiopathic gout of right foot   [place], DO   2 months ago COPD with acute exacerbation [facility])   [place], DO   6 months ago Type 2 diabetes mellitus with peripheral neuropathy [facility])   [place], DO   10 months ago Type 2 diabetes mellitus with peripheral neuropathy [facility])   [facility] [name], DO   1 year ago Annual physical exam   [place], DO       Future Appointments             In 2 months Fletcher Anon, Mertie Clause, MD [facility], [place]

## 2019-10-01 NOTE — Chronic Care Management (AMB) (Signed)
Chronic Care Management   Follow Up Note   10/01/2019 Name: Philip [name] Sr. MRN: [id number] DOB: [birth year]  Referred by: [name], DO Reason for referral : Chronic Care Management (Patient Phone Call)   Philip Potters Sr. is a 74 y.o. year old male who is a primary care patient of [name], DO. The CCM team was consulted for assistance with chronic disease management and care coordination needs.    I reached out to Philip Potters Sr. by phone today.   Review of patient status, including review of consultants reports, relevant laboratory and other test results, and collaboration with appropriate care team members and the patient's provider was performed as part of comprehensive patient evaluation and provision of chronic care management services.    Outpatient Encounter Medications as of 10/01/2019  Medication Sig  . cetirizine (ZYRTEC) 10 MG tablet Take 10 mg by mouth daily.   . dapagliflozin propanediol (FARXIGA) 5 MG TABS tablet Take 5 mg by mouth daily before breakfast.  . losartan (COZAAR) 25 MG tablet Take 0.5 tablets (12.5 mg total) by mouth daily.  . metFORMIN (GLUCOPHAGE) 500 MG tablet TAKE 2 TABLETS BY MOUTH TWICE DAILY WITH A MEAL (Patient taking differently: Take 1,000 mg by mouth 2 (two) times daily with a meal. )  . metoprolol tartrate (LOPRESSOR) 50 MG tablet Take 1.5 tablets (75 mg total) by mouth 2 (two) times daily.  . montelukast (SINGULAIR) 10 MG tablet Take 10 mg by mouth at bedtime.  . predniSONE (DELTASONE) 5 MG tablet Take 5 mg by mouth daily with breakfast.  . Albuterol Sulfate 108 (90 Base) MCG/ACT AEPB Inhale 2 puffs into the lungs every 6 (six) hours as needed (shortness of breath).   . Artificial Tear Ointment (DRY EYES OP) Place 1 drop into both eyes daily as needed (for dry eyes).  [name] atorvastatin (LIPITOR) 40 MG tablet Take 1 tablet by mouth once daily  . Blood Glucose Monitoring Suppl (ONE TOUCH ULTRA 2) w/Device KIT Use to  check blood sugar as advised up to twice a day  . cyclobenzaprine (FLEXERIL) 10 MG tablet Take 10 mg by mouth 3 (three) times daily.  . diclofenac sodium (VOLTAREN) 1 % GEL Apply 2 g topically 3 (three) times daily as needed (Use on elbow bursitis and knee).  [name] ELIQUIS 5 MG TABS tablet Take 1 tablet by mouth twice daily  . Ensure Max Protein (ENSURE MAX PROTEIN) LIQD Take 330 mLs (11 oz total) by mouth 2 (two) times daily.  . fluticasone (FLONASE) 50 MCG/ACT nasal spray Place 1 spray into both nostrils daily as needed for allergies or rhinitis.  . furosemide (LASIX) 40 MG tablet Take 0.5-1 tablets (20-40 mg total) by mouth daily as needed for edema. Only as needed now.  . gabapentin (NEURONTIN) 100 MG capsule TAKE 1 CAPSULE BY MOUTH THREE TIMES DAILY  . guaiFENesin (MUCINEX) 600 MG 12 hr tablet Take 600 mg by mouth 2 (two) times daily.   [name] ipratropium-albuterol (DUONEB) 0.5-2.5 (3) MG/3ML SOLN Take 3 mLs by nebulization 4 (four) times daily.  [name] LANCETS ULTRA THIN MISC 1 Device by Does not apply route 4 (four) times daily -  before meals and at bedtime.  [name] levothyroxine (SYNTHROID) 88 MCG tablet Take 1 tablet by mouth once daily  . ONETOUCH ULTRA test strip USE TEST STRIP(S) TO CHECK GLUCOSE TWICE DAILY  . SYMBICORT 160-4.5 MCG/ACT inhaler Inhale 2 puffs 2 (two) times daily into the lungs.   . [  DISCONTINUED] methylPREDNISolone (MEDROL) 4 MG tablet TAKE 6 TABLETS ON DAY 1 THEN TAKE 5 TABLETS ON DAY 2 THEN TAKE 4 TABLETS ON DAY 3 THEN TAKE 3 TABLETS ON DAY 4 THEN TAKE 2 TABLETS ON DAY 5   No facility-administered encounter medications on file as of 10/01/2019.    Goals Addressed            This Visit's Progress   . PharmD - Medication Management       CARE PLAN ENTRY (see longitudinal plan of care for additional care plan information)  Current Barriers:  [name] Knowledge deficits related to dietary choices to improve blood sugar control . Financial - currently in coverage gap of Medicare Part D  plan  Pharmacist Clinical Goal(s):  [name] Over the next 30 days, patient will work with CM Pharmacist to address needs related to medication regimen optimization.  Interventions: . Perform chart review . Reports recent injury to his back ~2 weeks ago picking up a heavy object. Describes had nerve-like pain in his back and pain in right leg (hip and knee). Reports seen at Urgent Care and prescribed 6-day methylprednisolone tapered-dose pack o Reports completed course of methylprednisolone. Notes also sees a chiropractor o Reports started feeling better over past few days o Encourage patient to follow up with provider if symptoms not improving or worsen . Follow up regarding blood sugar control and monitoring o Reports currently taking  metformin 500 mg - 2 tablets (1,000 mg) twice daily as directed  Farxiga 5 mg once daily  Note patient continues on prednisone 5 mg once daily as directed by pulmonologist o Review recent blood sugar results (see below) o Counsel on importance of well-balanced diet and limiting carbohydrate portions sizes . Discuss blood pressure/ HR monitoring and control o Reports taking:  Losartan 25 mg - 1/2 tablet (12.5 mg) once daily  Metoprolol 75 mg twice daily  Furosemide ONLY as needed as directed by Cardiologist - denies needing recently o Review recent BP results (see below)  Denies missed doses  Attributes recent elevated BP readings to pain in back and right leg  Denies any further episodes of "weakness in legs" or low blood pressure readings . Counsel patient on importance of continuing to weigh himself daily and follow directions from Cardiologist for use of PRN furosemide (Lasix).  o Patient confirms weighing daily and denies any recent weight gain . Counsel patient on importance of contacting Cardiologist for BP and HR readings outside of established parameters . Discuss importance of medication adherence o Reports recently has not been using  maintenance inhaler consistently as he has felt like his breathing has been better  Counsel patient to resume using Symbicort twice daily as directed and to continue rescue inhaler (albuterol), in addition to cetirzine, fluticasone nasal spray, prednisone and montelukast as directed by Pulmonologist.  Patient Self Care Activities:  . Self administers medications as prescribed . Attends all scheduled provider appointments . Calls pharmacy for medication refills . Calls provider office for new concerns or questions  . Patient to check blood sugar regularly as directed and keep log  Fasting Blood Glucose After Supper  (~2 hours)  27 - May - -  28 - May 130 199  29 - May 129 280  30 - May 109 266  31 - May 137 226  1 - June  166 167  2 - June 122   Average 132 228   . Patient to keep log when checks blood pressure  AM  BP PM BP  27 - May - -  28 - May 143/92, HR 74 136/92, HR 80  29 - May 130/93, HR 78 128/102, HR 91  30 - May 140/88, HR 82 128/92, HR 74  31 - May 141/109, HR 88 143/103, HR 81  1 - June  155/99, HR 85 145/93, HR 73  2 - June 141/94, HR 75    Please see past updates related to this goal by clicking on the "Past Updates" button in the selected goal         Plan  Telephone follow up appointment with care management team member scheduled for: 7/7 at 9:30 am  [name], PharmD, [place] [phone]

## 2019-10-01 NOTE — Patient Instructions (Signed)
Thank you allowing the Chronic Care Management Team to be a part of your care! It was a pleasure speaking with you today!     CCM (Chronic Care Management) Team    Noreene Larsson RN, MSN, CCM Nurse Care Coordinator  [phone]   [name] PharmD  Clinical Pharmacist  [phone]   Eula Fried LCSW Clinical Social Worker [phone]  Visit Information  Goals Addressed            This Visit's Progress   . PharmD - Medication Management       CARE PLAN ENTRY (see longitudinal plan of care for additional care plan information)  Current Barriers:  [name] Knowledge deficits related to dietary choices to improve blood sugar control . Financial - currently in coverage gap of Medicare Part D plan  Pharmacist Clinical Goal(s):  [name] Over the next 30 days, patient will work with CM Pharmacist to address needs related to medication regimen optimization.  Interventions: . Perform chart review . Reports recent injury to his back ~2 weeks ago picking up a heavy object. Describes had nerve-like pain in his back and pain in right leg (hip and knee). Reports seen at Urgent Care and prescribed 6-day methylprednisolone tapered-dose pack o Reports completed course of methylprednisolone. Notes also sees a chiropractor o Reports started feeling better over past few days o Encourage patient to follow up with provider if symptoms not improving or worsen . Follow up regarding blood sugar control and monitoring o Reports currently taking  metformin 500 mg - 2 tablets (1,000 mg) twice daily as directed  Farxiga 5 mg once daily  Note patient continues on prednisone 5 mg once daily as directed by pulmonologist o Review recent blood sugar results (see below) o Counsel on importance of well-balanced diet and limiting carbohydrate portions sizes . Discuss blood pressure/ HR monitoring and control o Reports taking:  Losartan 25 mg - 1/2 tablet (12.5 mg) once daily  Metoprolol 75 mg twice  daily  Furosemide ONLY as needed as directed by Cardiologist - denies needing recently o Review recent BP results (see below)  Denies missed doses  Attributes recent elevated BP readings to pain in back and right leg  Denies any further episodes of "weakness in legs" or low blood pressure readings . Counsel patient on importance of continuing to weigh himself daily and follow directions from Cardiologist for use of PRN furosemide (Lasix).  o Patient confirms weighing daily and denies any recent weight gain . Counsel patient on importance of contacting Cardiologist for BP and HR readings outside of established parameters . Discuss importance of medication adherence o Reports recently has not been using maintenance inhaler consistently as he has felt like his breathing has been better  Counsel patient to resume using Symbicort twice daily as directed and to continue rescue inhaler (albuterol), in addition to cetirzine, fluticasone nasal spray, prednisone and montelukast as directed by Pulmonologist.  Patient Self Care Activities:  . Self administers medications as prescribed . Attends all scheduled provider appointments . Calls pharmacy for medication refills . Calls provider office for new concerns or questions  . Patient to check blood sugar regularly as directed and keep log  Fasting Blood Glucose After Supper  (~2 hours)  27 - May - -  28 - May 130 199  29 - May 129 280  30 - May 109 266  31 - May 137 226  1 - June  166 167  2 - June 122   Average  132 228   . Patient to keep log when checks blood pressure  AM BP PM BP  27 - May - -  28 - May 143/92, HR 74 136/92, HR 80  29 - May 130/93, HR 78 128/102, HR 91  30 - May 140/88, HR 82 128/92, HR 74  31 - May 141/109, HR 88 143/103, HR 81  1 - June  155/99, HR 85 145/93, HR 73  2 - June 141/94, HR 75    Please see past updates related to this goal by clicking on the "Past Updates" button in the selected goal          Patient verbalizes understanding of instructions provided today.   Telephone follow up appointment with care management team member scheduled for: 7/7 at 9:30 am  [name], PharmD, [place] [phone]

## 2019-10-03 ENCOUNTER — Other Ambulatory Visit: Payer: Self-pay | Admitting: Cardiovascular Disease

## 2019-10-03 DIAGNOSIS — M5431 Sciatica, right side: Secondary | ICD-10-CM | POA: Diagnosis not present

## 2019-10-03 DIAGNOSIS — M9905 Segmental and somatic dysfunction of pelvic region: Secondary | ICD-10-CM | POA: Diagnosis not present

## 2019-10-03 DIAGNOSIS — M9903 Segmental and somatic dysfunction of lumbar region: Secondary | ICD-10-CM | POA: Diagnosis not present

## 2019-10-03 DIAGNOSIS — M4306 Spondylolysis, lumbar region: Secondary | ICD-10-CM | POA: Diagnosis not present

## 2019-10-06 DIAGNOSIS — M9905 Segmental and somatic dysfunction of pelvic region: Secondary | ICD-10-CM | POA: Diagnosis not present

## 2019-10-06 DIAGNOSIS — M9903 Segmental and somatic dysfunction of lumbar region: Secondary | ICD-10-CM | POA: Diagnosis not present

## 2019-10-06 DIAGNOSIS — M5431 Sciatica, right side: Secondary | ICD-10-CM | POA: Diagnosis not present

## 2019-10-06 DIAGNOSIS — M4306 Spondylolysis, lumbar region: Secondary | ICD-10-CM | POA: Diagnosis not present

## 2019-10-08 DIAGNOSIS — M5431 Sciatica, right side: Secondary | ICD-10-CM | POA: Diagnosis not present

## 2019-10-08 DIAGNOSIS — M9905 Segmental and somatic dysfunction of pelvic region: Secondary | ICD-10-CM | POA: Diagnosis not present

## 2019-10-08 DIAGNOSIS — M9903 Segmental and somatic dysfunction of lumbar region: Secondary | ICD-10-CM | POA: Diagnosis not present

## 2019-10-08 DIAGNOSIS — M4306 Spondylolysis, lumbar region: Secondary | ICD-10-CM | POA: Diagnosis not present

## 2019-10-10 ENCOUNTER — Ambulatory Visit: Payer: Medicare Other | Admitting: Cardiovascular Disease

## 2019-10-13 DIAGNOSIS — M4306 Spondylolysis, lumbar region: Secondary | ICD-10-CM | POA: Diagnosis not present

## 2019-10-13 DIAGNOSIS — M5431 Sciatica, right side: Secondary | ICD-10-CM | POA: Diagnosis not present

## 2019-10-13 DIAGNOSIS — M9905 Segmental and somatic dysfunction of pelvic region: Secondary | ICD-10-CM | POA: Diagnosis not present

## 2019-10-13 DIAGNOSIS — M9903 Segmental and somatic dysfunction of lumbar region: Secondary | ICD-10-CM | POA: Diagnosis not present

## 2019-10-15 DIAGNOSIS — M4306 Spondylolysis, lumbar region: Secondary | ICD-10-CM | POA: Diagnosis not present

## 2019-10-15 DIAGNOSIS — M9903 Segmental and somatic dysfunction of lumbar region: Secondary | ICD-10-CM | POA: Diagnosis not present

## 2019-10-15 DIAGNOSIS — M5431 Sciatica, right side: Secondary | ICD-10-CM | POA: Diagnosis not present

## 2019-10-15 DIAGNOSIS — M9905 Segmental and somatic dysfunction of pelvic region: Secondary | ICD-10-CM | POA: Diagnosis not present

## 2019-10-20 DIAGNOSIS — M9905 Segmental and somatic dysfunction of pelvic region: Secondary | ICD-10-CM | POA: Diagnosis not present

## 2019-10-20 DIAGNOSIS — M4306 Spondylolysis, lumbar region: Secondary | ICD-10-CM | POA: Diagnosis not present

## 2019-10-20 DIAGNOSIS — M5431 Sciatica, right side: Secondary | ICD-10-CM | POA: Diagnosis not present

## 2019-10-20 DIAGNOSIS — M9903 Segmental and somatic dysfunction of lumbar region: Secondary | ICD-10-CM | POA: Diagnosis not present

## 2019-10-22 DIAGNOSIS — M4306 Spondylolysis, lumbar region: Secondary | ICD-10-CM | POA: Diagnosis not present

## 2019-10-22 DIAGNOSIS — M5431 Sciatica, right side: Secondary | ICD-10-CM | POA: Diagnosis not present

## 2019-10-22 DIAGNOSIS — M9905 Segmental and somatic dysfunction of pelvic region: Secondary | ICD-10-CM | POA: Diagnosis not present

## 2019-10-22 DIAGNOSIS — M9903 Segmental and somatic dysfunction of lumbar region: Secondary | ICD-10-CM | POA: Diagnosis not present

## 2019-10-27 DIAGNOSIS — M9905 Segmental and somatic dysfunction of pelvic region: Secondary | ICD-10-CM | POA: Diagnosis not present

## 2019-10-27 DIAGNOSIS — M4306 Spondylolysis, lumbar region: Secondary | ICD-10-CM | POA: Diagnosis not present

## 2019-10-27 DIAGNOSIS — M9903 Segmental and somatic dysfunction of lumbar region: Secondary | ICD-10-CM | POA: Diagnosis not present

## 2019-10-27 DIAGNOSIS — M5431 Sciatica, right side: Secondary | ICD-10-CM | POA: Diagnosis not present

## 2019-10-29 ENCOUNTER — Telehealth: Payer: Self-pay | Admitting: Family Medicine

## 2019-10-29 DIAGNOSIS — M9905 Segmental and somatic dysfunction of pelvic region: Secondary | ICD-10-CM | POA: Diagnosis not present

## 2019-10-29 DIAGNOSIS — M9903 Segmental and somatic dysfunction of lumbar region: Secondary | ICD-10-CM | POA: Diagnosis not present

## 2019-10-29 DIAGNOSIS — M4306 Spondylolysis, lumbar region: Secondary | ICD-10-CM | POA: Diagnosis not present

## 2019-10-29 DIAGNOSIS — M5431 Sciatica, right side: Secondary | ICD-10-CM | POA: Diagnosis not present

## 2019-10-29 NOTE — Chronic Care Management (AMB) (Signed)
  Chronic Care Management   Note  10/29/2019 Name: CHINEDU AGUSTIN Sr. MRN: [id number] DOB: 01/28/1946  [name] is a 74 y.o. year old male who is a primary care patient of [name], DO and is actively engaged with the care management team. I reached out to [place]. by phone today to assist with re-scheduling a follow up visit with the Pharmacist.  Follow up plan: Telephone appointment with care management team member scheduled for: 11/05/2019 @ 16:00  [place], [facility]  [place], [place] [address] Direct Dial: [email or web address] Website: [email or web address]

## 2019-11-05 ENCOUNTER — Ambulatory Visit (INDEPENDENT_AMBULATORY_CARE_PROVIDER_SITE_OTHER): Payer: Medicare Other | Admitting: Pharmacist

## 2019-11-05 ENCOUNTER — Telehealth: Payer: Self-pay

## 2019-11-05 DIAGNOSIS — J432 Centrilobular emphysema: Secondary | ICD-10-CM

## 2019-11-05 DIAGNOSIS — I129 Hypertensive chronic kidney disease with stage 1 through stage 4 chronic kidney disease, or unspecified chronic kidney disease: Secondary | ICD-10-CM | POA: Diagnosis not present

## 2019-11-05 DIAGNOSIS — E1169 Type 2 diabetes mellitus with other specified complication: Secondary | ICD-10-CM

## 2019-11-05 DIAGNOSIS — E785 Hyperlipidemia, unspecified: Secondary | ICD-10-CM

## 2019-11-05 DIAGNOSIS — N182 Chronic kidney disease, stage 2 (mild): Secondary | ICD-10-CM | POA: Diagnosis not present

## 2019-11-05 NOTE — Chronic Care Management (AMB) (Signed)
Chronic Care Management   Follow Up Note   11/05/2019 Name: Philip MAINER Sr. MRN: [id number] DOB: Mar 03, 1946  Referred by: [name], DO Reason for referral : Chronic Care Management (Patient Phone Call)   Philip Potters Sr. is a 74 y.o. year old male who is a primary care patient of [name], DO. The CCM team was consulted for assistance with chronic disease management and care coordination needs.    I reached out to Philip Potters Sr. by phone today.   Review of patient status, including review of consultants reports, relevant laboratory and other test results, and collaboration with appropriate care team members and the patient's provider was performed as part of comprehensive patient evaluation and provision of chronic care management services.     Outpatient Encounter Medications as of 11/05/2019  Medication Sig  . Albuterol Sulfate 108 (90 Base) MCG/ACT AEPB Inhale 2 puffs into the lungs every 6 (six) hours as needed (shortness of breath).   . cetirizine (ZYRTEC) 10 MG tablet Take 10 mg by mouth daily.   . dapagliflozin propanediol (FARXIGA) 5 MG TABS tablet Take 5 mg by mouth daily before breakfast.  . ELIQUIS 5 MG TABS tablet Take 1 tablet by mouth twice daily  . fluticasone (FLONASE) 50 MCG/ACT nasal spray Place 1 spray into both nostrils daily as needed for allergies or rhinitis.  [name] losartan (COZAAR) 25 MG tablet Take 0.5 tablets (12.5 mg total) by mouth daily.  . metFORMIN (GLUCOPHAGE) 500 MG tablet TAKE 2 TABLETS BY MOUTH TWICE DAILY WITH A MEAL (Patient taking differently: Take 1,000 mg by mouth 2 (two) times daily with a meal. )  . metoprolol tartrate (LOPRESSOR) 50 MG tablet Take 1.5 tablets (75 mg total) by mouth 2 (two) times daily.  . montelukast (SINGULAIR) 10 MG tablet Take 10 mg by mouth at bedtime.  . predniSONE (DELTASONE) 5 MG tablet Take 5 mg by mouth daily with breakfast.  . SYMBICORT 160-4.5 MCG/ACT inhaler Inhale 2 puffs 2 (two)  times daily into the lungs.   . Artificial Tear Ointment (DRY EYES OP) Place 1 drop into both eyes daily as needed (for dry eyes).  [name] atorvastatin (LIPITOR) 40 MG tablet Take 1 tablet by mouth once daily  . Blood Glucose Monitoring Suppl (ONE TOUCH ULTRA 2) w/Device KIT Use to check blood sugar as advised up to twice a day  . cyclobenzaprine (FLEXERIL) 10 MG tablet Take 10 mg by mouth 3 (three) times daily.  . diclofenac sodium (VOLTAREN) 1 % GEL Apply 2 g topically 3 (three) times daily as needed (Use on elbow bursitis and knee).  . Ensure Max Protein (ENSURE MAX PROTEIN) LIQD Take 330 mLs (11 oz total) by mouth 2 (two) times daily.  . furosemide (LASIX) 40 MG tablet Take 0.5-1 tablets (20-40 mg total) by mouth daily as needed for edema. Only as needed now. (Patient not taking: Reported on 11/05/2019)  . gabapentin (NEURONTIN) 100 MG capsule TAKE 1 CAPSULE BY MOUTH THREE TIMES DAILY  . guaiFENesin (MUCINEX) 600 MG 12 hr tablet Take 600 mg by mouth 2 (two) times daily.   [name] ipratropium-albuterol (DUONEB) 0.5-2.5 (3) MG/3ML SOLN Take 3 mLs by nebulization 4 (four) times daily.  [name] LANCETS ULTRA THIN MISC 1 Device by Does not apply route 4 (four) times daily -  before meals and at bedtime.  [name] levothyroxine (SYNTHROID) 88 MCG tablet Take 1 tablet (88 mcg total) by mouth daily before breakfast.  . ONETOUCH ULTRA test  strip USE TEST STRIP(S) TO CHECK GLUCOSE TWICE DAILY   No facility-administered encounter medications on file as of 11/05/2019.    Goals Addressed            This Visit's Progress   . PharmD - Medication Management       CARE PLAN ENTRY (see longitudinal plan of care for additional care plan information)  Current Barriers:  [name] Knowledge deficits related to dietary choices to improve blood sugar control . Financial - currently in coverage gap of Medicare Part D plan  Pharmacist Clinical Goal(s):  [name] Over the next 30 days, patient will work with CM Pharmacist to address needs related  to medication regimen optimization.  Interventions: . Reports had a fall ~2 weeks ago, which he attributes to trouble with his right knee. Scheduled to see Orthopedic Surgery at [facility] for more evaluation of knee and hip since fall. o Denies dizziness or trouble with balance prior to fall o Reports had a bruise right knee after fall, but has resolved since. . Follow up regarding blood sugar control and monitoring o Reports currently taking  metformin 500 mg - 2 tablets (1,000 mg) twice daily as directed  Farxiga 5 mg once daily  Note patient continues on prednisone 5 mg once daily as directed by pulmonologist o Review recent blood sugar results (see below) o Counsel on importance of well-balanced diet and limiting carbohydrate portions sizes  Patient attributes some of his recent higher readings to drinking sweet tea and portions of ice cream o Patient notes that his ability to exercise is limited by his knee and hip pain . Discuss blood pressure/ HR monitoring and control o Reports taking:  Losartan 25 mg - 1/2 tablet (12.5 mg) once daily  Metoprolol 75 mg twice daily  Furosemide ONLY as needed as directed by Cardiologist - denies needing recently o Review recent BP results (see below)  Denies missed doses  Attributes recent elevated BP readings to pain in right knee and in hips  Denies any further episodes of "weakness in legs" or low blood pressure readings . Counsel patient on importance of continuing to weigh himself daily and follow directions from Cardiologist for use of PRN furosemide (Lasix).  o Patient confirms weighing daily and denies any recent weight gain . Counsel patient on importance of contacting Cardiologist for BP and HR readings outside of established parameters . Discuss importance of medication adherence o Reports recently has not been using maintenance inhaler consistently as he has felt like his breathing has been better  Counsel patient to  resume using Symbicort twice daily as directed and to continue rescue inhaler (albuterol), in addition to cetirzine, fluticasone nasal spray, prednisone and montelukast as directed by Pulmonologist. . Counsel on COVID-19 vaccinations and continued importance of COVID-19 prevention o States that he is not currently ready to receive the vaccine due to concerns about side effects. o States that he will continue to consider receiving in the future  Patient Self Care Activities:  . Self administers medications as prescribed . Attends all scheduled provider appointments o Appointment with Orthopedics on 7/15 . Calls pharmacy for medication refills . Calls provider office for new concerns or questions  . Patient to check blood sugar regularly as directed and keep log  Fasting Blood Glucose After Breakfast After Supper  (~2 hours)  1 - July 149  157  2 - July 123  234  3 - July 117  243  4 - July 137  252  5 - July  246 315  6 - July 151  223  7 - July 143    Average 137  237   . Patient to keep log when checks blood pressure  AM BP PM BP Notes  1 - July 136/96, HR 71 137/92, HR 88   2 - July 146/105, HR 73 140/90, HR 86   3 - July 139/99, HR 80 144/98, HR 88   4 - July 133/94, HR 87 139/95, HR 94   5 - July 129/86, HR 85 138/100, HR 89   6 - July 153/99, HR 79 144/86, HR 90   7 - July 141/110*, HR 90  *In pain    Please see past updates related to this goal by clicking on the "Past Updates" button in the selected goal         Plan  Telephone follow up appointment with care management team member scheduled for: 8/4 at 9 am  [name], PharmD, [place] [phone]

## 2019-11-06 NOTE — Patient Instructions (Signed)
Thank you allowing the Chronic Care Management Team to be a part of your care! It was a pleasure speaking with you today!     CCM (Chronic Care Management) Team    Noreene Larsson RN, MSN, CCM Nurse Care Coordinator  [phone]   [name] PharmD  Clinical Pharmacist  [phone]   Eula Fried LCSW Clinical Social Worker [phone]  Visit Information  Goals Addressed            This Visit's Progress   . PharmD - Medication Management       CARE PLAN ENTRY (see longitudinal plan of care for additional care plan information)  Current Barriers:  [name] Knowledge deficits related to dietary choices to improve blood sugar control . Financial - currently in coverage gap of Medicare Part D plan  Pharmacist Clinical Goal(s):  [name] Over the next 30 days, patient will work with CM Pharmacist to address needs related to medication regimen optimization.  Interventions: . Reports had a fall ~2 weeks ago, which he attributes to trouble with his right knee. Scheduled to see Orth[facility] Surgery at [facility] for more evaluation of knee and hip since fall. o Denies dizziness or trouble with balance with fall o Reports had a bruise right knee after fall, but has resolved since. . Follow up regarding blood sugar control and monitoring o Reports currently taking  metformin 500 mg - 2 tablets (1,000 mg) twice daily as directed  Farxiga 5 mg once daily  Note patient continues on prednisone 5 mg once daily as directed by pulmonologist o Review recent blood sugar results (see below) o Counsel on importance of well-balanced diet and limiting carbohydrate portions sizes  Patient attributes some of his recent higher readings to drinking sweet tea and portions of ice cream o Patient notes that his ability to exercise is limited by knee and hip pain . Discuss blood pressure/ HR monitoring and control o Reports taking:  Losartan 25 mg - 1/2 tablet (12.5 mg) once daily  Metoprolol  75 mg twice daily  Furosemide ONLY as needed as directed by Cardiologist - denies needing recently o Review recent BP results (see below)  Denies missed doses  Attributes recent elevated BP readings to pain in right knee and in hips . Counsel patient on importance of continuing to weigh himself daily and follow directions from Cardiologist for use of PRN furosemide (Lasix).  o Patient confirms weighing daily and denies any recent weight gain . Counsel patient on importance of contacting Cardiologist for BP and HR readings outside of established parameters . Discuss importance of medication adherence o Reports recently has not been using maintenance inhaler consistently as he has felt like his breathing has been better  Counsel patient to resume using Symbicort twice daily as directed and to continue rescue inhaler (albuterol), in addition to cetirzine, fluticasone nasal spray, prednisone and montelukast as directed by Pulmonologist. . Counsel on COVID-19 vaccinations and continued importance of COVID-19 prevention o States that he is not currently ready to receive the vaccine due to concerns about side effects. o States that he will continue to consider receiving in the future  Patient Self Care Activities:  . Self administers medications as prescribed . Attends all scheduled provider appointments o Appointment with Orthopedics on 7/15 . Calls pharmacy for medication refills . Calls provider office for new concerns or questions  . Patient to check blood sugar regularly as directed and keep log  Fasting Blood Glucose After Breakfast After Supper  (~2  hours)  1 - July 149  157  2 - July 123  234  3 - July 117  243  4 - July 137  252  5 - July  246 315  6 - July 151  223  7 - July 143    Average 137  237   . Patient to keep log when checks blood pressure  AM BP PM BP Notes  1 - July 136/96, HR 71 137/92, HR 88   2 - July 146/105, HR 73 140/90, HR 86   3 - July 139/99, HR 80  144/98, HR 88   4 - July 133/94, HR 87 139/95, HR 94   5 - July 129/86, HR 85 138/100, HR 89   6 - July 153/99, HR 79 144/86, HR 90   7 - July 141/110*, HR 90  *In pain    Please see past updates related to this goal by clicking on the "Past Updates" button in the selected goal         Patient verbalizes understanding of instructions provided today.   Telephone follow up appointment with care management team member scheduled for:  8/4 at 9 am  [name], PharmD, [place] [phone]

## 2019-11-13 ENCOUNTER — Other Ambulatory Visit: Payer: Self-pay | Admitting: Family Medicine

## 2019-11-13 DIAGNOSIS — M5441 Lumbago with sciatica, right side: Secondary | ICD-10-CM | POA: Diagnosis not present

## 2019-11-13 DIAGNOSIS — E119 Type 2 diabetes mellitus without complications: Secondary | ICD-10-CM

## 2019-11-13 DIAGNOSIS — M25561 Pain in right knee: Secondary | ICD-10-CM | POA: Diagnosis not present

## 2019-11-13 DIAGNOSIS — G8929 Other chronic pain: Secondary | ICD-10-CM | POA: Diagnosis not present

## 2019-11-13 DIAGNOSIS — M1711 Unilateral primary osteoarthritis, right knee: Secondary | ICD-10-CM | POA: Diagnosis not present

## 2019-11-17 ENCOUNTER — Telehealth: Payer: Self-pay | Admitting: Family

## 2019-11-17 DIAGNOSIS — E785 Hyperlipidemia, unspecified: Secondary | ICD-10-CM

## 2019-11-17 NOTE — Telephone Encounter (Signed)
Upcoming appointment with Dr. Fletcher Anon 12/02/19 at 3:40 PM. He is due for fasting lipid panel. As his appointment is not until the afternoon, I reached out to him about having blood work done prior and he was agreeable.  Labs placed for CMET, lipid panel at the [facility]. He is aware to fast prior to these labs and will have them collected before his upcoming appointment.   Philip Dubonnet, NP

## 2019-11-19 ENCOUNTER — Ambulatory Visit (INDEPENDENT_AMBULATORY_CARE_PROVIDER_SITE_OTHER): Payer: Medicare Other | Admitting: Family Medicine

## 2019-11-19 ENCOUNTER — Encounter: Payer: Self-pay | Admitting: Family Medicine

## 2019-11-19 ENCOUNTER — Other Ambulatory Visit: Payer: Self-pay

## 2019-11-19 VITALS — BP 132/77 | HR 110 | Temp 96.9°F | Resp 16 | Ht 71.0 in | Wt 201.6 lb

## 2019-11-19 DIAGNOSIS — M1611 Unilateral primary osteoarthritis, right hip: Secondary | ICD-10-CM

## 2019-11-19 DIAGNOSIS — G8929 Other chronic pain: Secondary | ICD-10-CM | POA: Insufficient documentation

## 2019-11-19 DIAGNOSIS — M25551 Pain in right hip: Secondary | ICD-10-CM

## 2019-11-19 DIAGNOSIS — B3749 Other urogenital candidiasis: Secondary | ICD-10-CM | POA: Diagnosis not present

## 2019-11-19 DIAGNOSIS — M7061 Trochanteric bursitis, right hip: Secondary | ICD-10-CM

## 2019-11-19 MED ORDER — TRAMADOL HCL 50 MG PO TABS: 50.0000 mg | ORAL_TABLET | Freq: Four times a day (QID) | ORAL | 0 refills | Status: AC | PRN

## 2019-11-19 MED ORDER — LIDOCAINE HCL (PF) 1 % IJ SOLN
4.0000 mL | Freq: Once | INTRAMUSCULAR | Status: AC
  Administered 2019-11-19: 4 mL

## 2019-11-19 MED ORDER — FLUCONAZOLE 150 MG PO TABS: ORAL_TABLET | ORAL | 1 refills | Status: DC

## 2019-11-19 MED ORDER — METHYLPREDNISOLONE ACETATE 40 MG/ML IJ SUSP
40.0000 mg | Freq: Once | INTRAMUSCULAR | Status: AC
  Administered 2019-11-19: 40 mg via INTRA_ARTICULAR

## 2019-11-19 NOTE — Progress Notes (Signed)
Subjective:    Patient ID: Philip Husbands., male    DOB: Jun 08, 1945, 74 y.o.   MRN: [id number]  Philip GROMAN Sr. is a 74 y.o. male presenting on 11/19/2019 for Hip Pain and itchness (side effect of farxiga)  Patient presents for a same day appointment.  Accompanied by his wife today.  HPI   Right Hip Pain, Chronic History of Osteoarthritis DJD Lumbar Spine / Sciatica - Recent history, seen by [facility] PA working with Dr Marry Guan, treated on 11/13/19, for Right knee pain and low back pain - he declined a repeat cortisone injection on 7/15, in past he had cortisone injections in the past with 3+ month lasting effects. s/p visco injections in past 2020, he was improving, he did have fall injury. Set back. Initially was improving after chiropractor treatment. Some benefit, then worsening since fall on R knee. He declined repeat cortisone injection because he was told in past that he may need knee replacement. - Today he describes R hip pain lateral side with aching down to knee and ankle at times, deeper ache, sitting is worse if putting downward pressure on the hip it provokes pain, not worsening with walking and standing, seems to stretch out more. - Given Methylprednisone dose pak, no relief for R hip Taking Tylenol regularly In past has had Tramadol for other pain problems with some relief. Failed Gabapentin side effect before. Limited relief on muscle relaxants in past - Has had Left hip x-ray, from orthopedic and lumbar spine, told some bone spurs - Denies numbness tingling, weakness, swelling redness  Yeast Infection / Balanitis Additionally new problem recently some white discharge itching of penis he is uncircumcised. Taking [name] says this may be making it worse   Depression screen [facility] 2/9 03/12/2019 12/31/2018 12/02/2018  Decreased Interest 0 0 0  Down, Depressed, Hopeless 0 0 0  PHQ - 2 Score 0 0 0  Altered sleeping - - -  Tired, decreased energy - - -    Change in appetite - - -  Feeling bad or failure about yourself  - - -  Trouble concentrating - - -  Moving slowly or fidgety/restless - - -  Suicidal thoughts - - -  PHQ-9 Score - - -  Difficult doing work/chores - - -  Some recent data might be hidden    Social History   Tobacco Use  . Smoking status: Former Smoker    Packs/day: 1.00    Years: 8.00    Pack years: 8.00    Types: Cigarettes    Quit date: 12/31/1967    Years since quitting: 51.9  . Smokeless tobacco: Former Network engineer  . Vaping Use: Never used  Substance Use Topics  . Alcohol use: No    Alcohol/week: 0.0 standard drinks    Comment: 05/06/2012 "last alcohol several years ago; never had problem wit"  . Drug use: No    Review of Systems Per HPI unless specifically indicated above     Objective:    BP 132/77   Pulse (!) 110   Temp (!) 96.9 F (36.1 C) (Temporal)   Resp 16   Ht 5\' 11"  (1.803 m)   Wt 201 lb 9.6 oz (91.4 kg)   SpO2 97%   BMI 28.12 kg/m   Wt Readings from Last 3 Encounters:  11/19/19 201 lb 9.6 oz (91.4 kg)  09/12/19 208 lb 6.4 oz (94.5 kg)  08/15/19 205 lb (93 kg)  Physical Exam Vitals and nursing note reviewed.  Constitutional:      General: He is not in acute distress.    Appearance: He is well-developed. He is not diaphoretic.     Comments: Well-appearing, comfortable, cooperative  HENT:     Head: Normocephalic and atraumatic.  Eyes:     General:        Right eye: No discharge.        Left eye: No discharge.     Conjunctiva/sclera: Conjunctivae normal.  Cardiovascular:     Rate and Rhythm: Normal rate.  Pulmonary:     Effort: Pulmonary effort is normal.  Genitourinary:    Comments: Patient declined Musculoskeletal:     Comments: Low Back / R hip and Greater Trochanter Inspection: BACK - Normal appearance, no spinal deformity, symmetrical. HIP - Normal appearance, symmetrical, no obvious leg length or pelvis deformity  Palpation: BACK - No tenderness over  spinous processes. Bilateral lumbar paraspinal muscles non-tender and without hypertonicity/spasm. Some mild discomfort over R iliac crest region of hip. HIP - Mild tender to palpation deeper R greater trochanter region of lateral upper thigh. Lower extremity thigh calf soft non tender no spasm.  ROM: BACK - Full active ROM forward flex / back extension, rotation L/R without discomfort HIP - Bilateral hip flex/ext supine normal, internal and external rotation normal without problem or limitation.  Special Testing: BACK - Seated SLR negative for radicular pain bilaterally HIP - some pain with limited range of motion internal rotation. Acetabular compression of hip R is mild positive for pain  Strength: Bilateral hip flex/ext 5/5, knee flex/ext 5/5, ankle dorsiflex/plantarflex 5/5 Neurovascular: intact distal sensation to light touch   Skin:    General: Skin is warm and dry.     Findings: No erythema or rash.  Neurological:     Mental Status: He is alert and oriented to person, place, and time.  Psychiatric:        Behavior: Behavior normal.     Comments: Well groomed, good eye contact, normal speech and thoughts      ________________________________________________________ PROCEDURE NOTE Date: 11/19/19 Right hip greater trochanteric bursa cortisone injection Discussed benefits and risks (including pain, bleeding, infection, steroid flare). Verbal consent given by patient. Medication:  1 cc Depo-medrol 40mg  and 4 cc Lidocaine 1% without epi Time Out taken  Trochanteric bursa Patient in lateral decubitus position with affected RIGHT hip superior. Landmarks identified over RIGHT greater trochanter. Palpated to identify point of maximum tenderness over bony process. Area cleansed with alcohol wipes. Using 21 gauage and 1, 1/2 inch needle, Right trochanteric bursa space was injected (with above listed medication) with needle to bone contact then slightly withdrawn to inject. cold spray  used for superficial anesthetic. Sterile bandage placed. Patient tolerated procedure well without bleeding or paresthesias. No complications.   Results for orders placed or performed during the hospital encounter of 30/86/57  Basic metabolic panel  Result Value Ref Range   Sodium 140 135 - 145 mmol/L   Potassium 4.5 3.5 - 5.1 mmol/L   Chloride 109 98 - 111 mmol/L   CO2 24 22 - 32 mmol/L   Glucose, Bld 285 (H) 70 - 99 mg/dL   BUN 17 8 - 23 mg/dL   Creatinine, Ser 1.09 0.61 - 1.24 mg/dL   Calcium 8.7 (L) 8.9 - 10.3 mg/dL   GFR calc non Af Amer >60 >60 mL/min   GFR calc Af Amer >60 >60 mL/min   Anion gap 7 5 - 15  Assessment & Plan:   Problem List Items Addressed This Visit    Chronic right hip pain - Primary   Relevant Medications   traMADol (ULTRAM) 50 MG tablet   Bursitis of hip   Relevant Medications   traMADol (ULTRAM) 50 MG tablet    Other Visit Diagnoses    Yeast dermatitis of penis       Relevant Medications   fluconazole (DIFLUCAN) 150 MG tablet   Primary osteoarthritis of right hip       Relevant Medications   traMADol (ULTRAM) 50 MG tablet   lidocaine (PF) (XYLOCAINE) 1 % injection 4 mL (Completed)   methylPREDNISolone acetate (DEPO-MEDROL) injection 40 mg (Completed)       Subacute on chronic gradual worsening problem with R Hip pain, likely trochanteric bursitis given history and exam with point tenderness. With known mild to moderate osteoarthritis as possible cause. He did have fall injury but pain onset prior to fall. Prior imaging from [facility], not available in chart - Some radiation of pain but not radicular not responding to conservative therapy  Plan: 1. Start R hip trochanteric steroid injection today in office, tolerated well some reduced pain after 2. Rx Tramadol short term 50mg  take q 6 hr PRN max 4 in day, #20, 0 refill short term trial, if need refill or extend course we can consider this. Or may need to adjust to stronger medication  Hydrocodone/Acetaminophen in future if indicated again shrot term 3. May use topical voltaren, heating pad 4. May use Tylenol PRN for breakthrough 5 Given handout for hip exercises and to modify activities  Follow-up 4-6 weeks if not improving, consider hip x-rays, and return to established [facility] ordered this encounter  Medications  . fluconazole (DIFLUCAN) 150 MG tablet    Sig: Take one tablet by mouth on Day 1. Repeat dose 2nd tablet on Day 3.    Dispense:  2 tablet    Refill:  1  . traMADol (ULTRAM) 50 MG tablet    Sig: Take 1 tablet (50 mg total) by mouth every 6 (six) hours as needed for up to 5 days.    Dispense:  20 tablet    Refill:  0  . lidocaine (PF) (XYLOCAINE) 1 % injection 4 mL  . methylPREDNISolone acetate (DEPO-MEDROL) injection 40 mg      Follow up plan: Return in about 4 weeks (around 12/17/2019), or if symptoms worsen or fail to improve, for hip pain.   [name], [facility] 11/19/2019, 11:15 AM

## 2019-11-19 NOTE — Patient Instructions (Addendum)
Thank you for coming to the office today.  You received a Right Hip Joint steroid injection today. - Lidocaine numbing medicine may ease the pain initially for a few hours until it wears off - As discussed, you may experience a "steroid flare" this evening or within 24-48 hours, anytime medicine is injected into an inflamed joint it can cause the pain to get worse temporarily - Everyone responds differently to these injections, it depends on the patient and the severity of the joint problem, it may provide anywhere from days to weeks, to months of relief. Ideal response is >6 months relief - Try to take it easy for next 1-2 days, avoid over activity and strain on joint (limit walking for hip) - Recommend the following:   - For swelling - rest, elevation, and ice packs as needed for first few days   - For pain in future may use heating pad or moist heat as needed  Medication  Start Tramadol 50mg  as needed up to 4 times a day every 6 hours as needed  Try topical Voltaren (Diclofenac) anti inflammatory on hip as needed - this is Over the counter.  Reach out to Orthopedics again if you need to discuss hip next.    Please schedule a Follow-up Appointment to: Return in about 4 weeks (around 12/17/2019), or if symptoms worsen or fail to improve, for hip pain.  If you have any other questions or concerns, please feel free to call the office or send a message through [place]. You may also schedule an earlier appointment if necessary.  Additionally, you may be receiving a survey about your experience at our office within a few days to 1 week by e-mail or mail. We value your feedback.  [name], DO [facility], [facility]   Hip Exercises Ask your health care provider which exercises are safe for you. Do exercises exactly as told by your health care provider and adjust them as directed. It is normal to feel mild stretching, pulling, tightness, or discomfort as you do these  exercises. Stop right away if you feel sudden pain or your pain gets worse. Do not begin these exercises until told by your health care provider. Stretching and range-of-motion exercises These exercises warm up your muscles and joints and improve the movement and flexibility of your hip. These exercises also help to relieve pain, numbness, and tingling. You may be asked to limit your range of motion if you had a hip replacement. Talk to your health care provider about these restrictions. Hamstrings, supine  1. Lie on your back (supine position). 2. Loop a belt or towel over the ball of your left / right foot. The ball of your foot is on the walking surface, right under your toes. 3. Straighten your left / right knee and slowly pull on the belt or towel to raise your leg until you feel a gentle stretch behind your knee (hamstring). ? Do not let your knee bend while you do this. ? Keep your other leg flat on the floor. 4. Hold this position for __________ seconds. 5. Slowly return your leg to the starting position. Repeat __________ times. Complete this exercise __________ times a day. Hip rotation  1. Lie on your back on a firm surface. 2. With your left / right hand, gently pull your left / right knee toward the shoulder that is on the same side of the body. Stop when your knee is pointing toward the ceiling. 3. Hold your left /  right ankle with your other hand. 4. Keeping your knee steady, gently pull your left / right ankle toward your other shoulder until you feel a stretch in your buttocks. ? Keep your hips and shoulders firmly planted while you do this stretch. 5. Hold this position for __________ seconds. Repeat __________ times. Complete this exercise __________ times a day. Seated stretch This exercise is sometimes called hamstrings and adductors stretch. 1. Sit on the floor with your legs stretched wide. Keep your knees straight during this exercise. 2. Keeping your head and back in  a straight line, bend at your waist to reach for your left foot (position A). You should feel a stretch in your right inner thigh (adductors). 3. Hold this position for __________ seconds. Then slowly return to the upright position. 4. Keeping your head and back in a straight line, bend at your waist to reach forward (position B). You should feel a stretch behind both of your thighs and knees (hamstrings). 5. Hold this position for __________ seconds. Then slowly return to the upright position. 6. Keeping your head and back in a straight line, bend at your waist to reach for your right foot (position C). You should feel a stretch in your left inner thigh (adductors). 7. Hold this position for __________ seconds. Then slowly return to the upright position. Repeat __________ times. Complete this exercise __________ times a day. Lunge This exercise stretches the muscles of the hip (hip flexors). 1. Place your left / right knee on the floor and bend your other knee so that is directly over your ankle. You should be half-kneeling. 2. Keep good posture with your head over your shoulders. 3. Tighten your buttocks to point your tailbone downward. This will prevent your back from arching too much. 4. You should feel a gentle stretch in the front of your left / right thigh and hip. If you do not feel a stretch, slide your other foot forward slightly and then slowly lunge forward with your chest up until your knee once again lines up over your ankle. ? Make sure your tailbone continues to point downward. 5. Hold this position for __________ seconds. 6. Slowly return to the starting position. Repeat __________ times. Complete this exercise __________ times a day. Strengthening exercises These exercises build strength and endurance in your hip. Endurance is the ability to use your muscles for a long time, even after they get tired. Bridge This exercise strengthens the muscles of your hip (hip  extensors). 1. Lie on your back on a firm surface with your knees bent and your feet flat on the floor. 2. Tighten your buttocks muscles and lift your bottom off the floor until the trunk of your body and your hips are level with your thighs. ? Do not arch your back. ? You should feel the muscles working in your buttocks and the back of your thighs. If you do not feel these muscles, slide your feet 1-2 inches (2.5-5 cm) farther away from your buttocks. 3. Hold this position for __________ seconds. 4. Slowly lower your hips to the starting position. 5. Let your muscles relax completely between repetitions. Repeat __________ times. Complete this exercise __________ times a day. Straight leg raises, side-lying This exercise strengthens the muscles that move the hip joint away from the center of the body (hip abductors). 1. Lie on your side with your left / right leg in the top position. Lie so your head, shoulder, hip, and knee line up. You may bend your  bottom knee slightly to help you balance. 2. Roll your hips slightly forward, so your hips are stacked directly over each other and your left / right knee is facing forward. 3. Leading with your heel, lift your top leg 4-6 inches (10-15 cm). You should feel the muscles in your top hip lifting. ? Do not let your foot drift forward. ? Do not let your knee roll toward the ceiling. 4. Hold this position for __________ seconds. 5. Slowly return to the starting position. 6. Let your muscles relax completely between repetitions. Repeat __________ times. Complete this exercise __________ times a day. Straight leg raises, side-lying This exercise strengthens the muscles that move the hip joint toward the center of the body (hip adductors). 1. Lie on your side with your left / right leg in the bottom position. Lie so your head, shoulder, hip, and knee line up. You may place your upper foot in front to help you balance. 2. Roll your hips slightly forward,  so your hips are stacked directly over each other and your left / right knee is facing forward. 3. Tense the muscles in your inner thigh and lift your bottom leg 4-6 inches (10-15 cm). 4. Hold this position for __________ seconds. 5. Slowly return to the starting position. 6. Let your muscles relax completely between repetitions. Repeat __________ times. Complete this exercise __________ times a day. Straight leg raises, supine This exercise strengthens the muscles in the front of your thigh (quadriceps). 1. Lie on your back (supine position) with your left / right leg extended and your other knee bent. 2. Tense the muscles in the front of your left / right thigh. You should see your kneecap slide up or see increased dimpling just above your knee. 3. Keep these muscles tight as you raise your leg 4-6 inches (10-15 cm) off the floor. Do not let your knee bend. 4. Hold this position for __________ seconds. 5. Keep these muscles tense as you lower your leg. 6. Relax the muscles slowly and completely between repetitions. Repeat __________ times. Complete this exercise __________ times a day. Hip abductors, standing This exercise strengthens the muscles that move the leg and hip joint away from the center of the body (hip abductors). 1. Tie one end of a rubber exercise band or tubing to a secure surface, such as a chair, table, or pole. 2. Loop the other end of the band or tubing around your left / right ankle. 3. Keeping your ankle with the band or tubing directly opposite the secured end, step away until there is tension in the tubing or band. Hold on to a chair, table, or pole as needed for balance. 4. Lift your left / right leg out to your side. While you do this: ? Keep your back upright. ? Keep your shoulders over your hips. ? Keep your toes pointing forward. ? Make sure to use your hip muscles to slowly lift your leg. Do not tip your body or forcefully lift your leg. 5. Hold this position  for __________ seconds. 6. Slowly return to the starting position. Repeat __________ times. Complete this exercise __________ times a day. Squats This exercise strengthens the muscles in the front of your thigh (quadriceps). 1. Stand in a door frame so your feet and knees are in line with the frame. You may place your hands on the frame for balance. 2. Slowly bend your knees and lower your hips like you are going to sit in a chair. ? Keep your  lower legs in a straight-up-and-down position. ? Do not let your hips go lower than your knees. ? Do not bend your knees lower than told by your health care provider. ? If your hip pain increases, do not bend as low. 3. Hold this position for ___________ seconds. 4. Slowly push with your legs to return to standing. Do not use your hands to pull yourself to standing. Repeat __________ times. Complete this exercise __________ times a day. This information is not intended to replace advice given to you by your health care provider. Make sure you discuss any questions you have with your health care provider. Document Revised: 11/21/2018 Document Reviewed: 02/26/2018 Elsevier Patient Education  [place].

## 2019-11-21 ENCOUNTER — Other Ambulatory Visit
Admission: RE | Admit: 2019-11-21 | Discharge: 2019-11-21 | Disposition: A | Discharge status: Home / Self Care | Payer: Medicare Other | Attending: Family | Admitting: Family

## 2019-11-21 DIAGNOSIS — E785 Hyperlipidemia, unspecified: Secondary | ICD-10-CM | POA: Insufficient documentation

## 2019-11-21 LAB — COMPREHENSIVE METABOLIC PANEL
ALT: 21 U/L (ref 0–44)
AST: 15 U/L (ref 15–41)
Albumin: 4.1 g/dL (ref 3.5–5.0)
Alkaline Phosphatase: 60 U/L (ref 38–126)
Anion gap: 9 (ref 5–15)
BUN: 26 mg/dL — ABNORMAL HIGH (ref 8–23)
CO2: 28 mmol/L (ref 22–32)
Calcium: 9.2 mg/dL (ref 8.9–10.3)
Chloride: 102 mmol/L (ref 98–111)
Creatinine, Ser: 1.07 mg/dL (ref 0.61–1.24)
GFR calc Af Amer: >60 mL/min (ref >60)
GFR calc non Af Amer: >60 mL/min (ref >60)
Glucose, Bld: 172 mg/dL — ABNORMAL HIGH (ref 70–99)
Potassium: 4.4 mmol/L (ref 3.5–5.1)
Sodium: 139 mmol/L (ref 135–145)
Total Bilirubin: 0.9 mg/dL (ref 0.3–1.2)
Total Protein: 6.6 g/dL (ref 6.5–8.1)

## 2019-11-21 LAB — LIPID PANEL
Cholesterol: 145 mg/dL (ref 0–200)
HDL: 67 mg/dL (ref >40)
LDL Cholesterol: 58 mg/dL (ref 0–99)
Total CHOL/HDL Ratio: 2.2 RATIO
Triglycerides: 99 mg/dL (ref <150)
VLDL: 20 mg/dL (ref 0–40)

## 2019-11-28 ENCOUNTER — Telehealth: Payer: Self-pay | Admitting: Family Medicine

## 2019-11-28 DIAGNOSIS — M7061 Trochanteric bursitis, right hip: Secondary | ICD-10-CM

## 2019-11-28 DIAGNOSIS — M1611 Unilateral primary osteoarthritis, right hip: Secondary | ICD-10-CM

## 2019-11-28 DIAGNOSIS — G8929 Other chronic pain: Secondary | ICD-10-CM

## 2019-11-28 DIAGNOSIS — M25551 Pain in right hip: Secondary | ICD-10-CM

## 2019-11-28 NOTE — Telephone Encounter (Signed)
Patient was seen by PCP on 11/19/2019 and was advised if symtomps did not improve to call in, patient states tramadol is slightly relieving the hip pain, seeking clinical advice    [facility] [place] Phone:  [phone]  Fax:  [phone]

## 2019-12-01 MED ORDER — HYDROCODONE-ACETAMINOPHEN 5-325 MG PO TABS: 1.0000 | ORAL_TABLET | ORAL | 0 refills | Status: DC | PRN

## 2019-12-01 NOTE — Telephone Encounter (Signed)
Patient had seen Dr Raliegh Ip last week and followed all the instruction as per Dr Raliegh Ip but all those med's or gel is not improving his Sx's and went to see orthopedic who thought that it might be his Knee and he will need knee replacement surgery --patient already ahead steroid shot twice for Knee and they have done HIP xray which shows bone spurs of spine or HIP. As per patient was told if it does not improve in 5 days call back ?

## 2019-12-01 NOTE — Telephone Encounter (Signed)
Called patient.  He seems to be a little confused on the timeline.  Last visit with me 11/19/19. I did R hip injection steroid, for hip pain.  I gave him Tramadol rx for pain.  He said injection and Tramadol not helping enough.  I asked him to call back if want to increase to Hydrocodone pain med temporary.  He last saw [facility] on 11/13/19 BEFORE my last visit.  They treated him for Back pain and knee pain, they did steroid and knee injection.  I now advised him, he needs to RETURN to [facility] orthopedic for his R hip pain, he will call them next to schedule.  Hydrocodone is only short term, discussed benefits risks, he is in significant pain still every day.  He may warrant further management of orthopedics now  [name], [facility] 12/01/2019, 5:43 PM

## 2019-12-02 ENCOUNTER — Other Ambulatory Visit: Payer: Self-pay

## 2019-12-02 ENCOUNTER — Encounter: Payer: Self-pay | Admitting: Cardiovascular Disease

## 2019-12-02 ENCOUNTER — Ambulatory Visit: Payer: Medicare Other | Admitting: Cardiovascular Disease

## 2019-12-02 VITALS — BP 120/80 | HR 84 | Ht 71.8 in | Wt 196.5 lb

## 2019-12-02 DIAGNOSIS — I4821 Permanent atrial fibrillation: Secondary | ICD-10-CM | POA: Diagnosis not present

## 2019-12-02 DIAGNOSIS — I10 Essential (primary) hypertension: Secondary | ICD-10-CM

## 2019-12-02 DIAGNOSIS — I5020 Unspecified systolic (congestive) heart failure: Secondary | ICD-10-CM

## 2019-12-02 DIAGNOSIS — I2510 Atherosclerotic heart disease of native coronary artery without angina pectoris: Secondary | ICD-10-CM

## 2019-12-02 NOTE — Patient Instructions (Signed)
Medication Instructions:  Your physician recommends that you continue on your current medications as directed. Please refer to the Current Medication list given to you today.  *If you need a refill on your cardiac medications before your next appointment, please call your pharmacy*   Lab Work: None ordered If you have labs (blood work) drawn today and your tests are completely normal, you will receive your results only by:  [place] (if you have MyChart) OR  A paper copy in the mail If you have any lab test that is abnormal or we need to change your treatment, we will call you to review the results.   Testing/Procedures: None ordered   Follow-Up: At [facility], you and your health needs are our priority.  As part of our continuing mission to provide you with exceptional heart care, we have created designated Provider Care Teams.  These Care Teams include your primary Cardiologist (physician) and Advanced Practice Providers (APPs -  Physician Assistants and Nurse Practitioners) who all work together to provide you with the care you need, when you need it.  We recommend signing up for the patient portal called "MyChart".  Sign up information is provided on this After Visit Summary.  MyChart is used to connect with patients for Virtual Visits (Telemedicine).  Patients are able to view lab/test results, encounter notes, upcoming appointments, etc.  Non-urgent messages can be sent to your provider as well.   To learn more about what you can do with MyChart, go to [email or web address].    Your next appointment:   Your physician wants you to follow-up in: 6 months You will receive a reminder letter in the mail two months in advance. If you don't receive a letter, please call our office to schedule the follow-up appointment.   The format for your next appointment:   In Person  Provider:    You may see [name], MD or one of the following Advanced Practice Providers on  your designated Care Team:    Murray Hodgkins, NP  Christell Faith, [name]  Marrianne Mood, [name]    Other Instructions N/A

## 2019-12-02 NOTE — Progress Notes (Signed)
Cardiology Office Note   Date:  12/02/2019   ID:  Philip GOLDMAN Sr., DOB 09-Aug-1945, MRN [id number]  PCP:  [name], DO  Cardiologist:   [name], MD   Chief Complaint  Patient presents with  . office visit    2-3 month F/U; Meds verbally reviewed with patient.      History of Present Illness: Philip [name]. is a 74 y.o. male who presents for a follow-up visit regarding coronary artery disease, chronic atrial fibrillation and atrial tachycardia.  He is status post CABG in January of 2014.  He had postoperative atrial fibrillation which was treated with amiodarone.    He was hospitalized in February 2021 with sepsis secondary to pneumonia and MSSA bacteremia complicated by atrial flutter with RVR.  Echo showed an EF of 40 to 45% with global hypokinesis, mildly reduced RV function, moderate pulmonary hypertension mild to moderate mitral regurgitation.  TEE showed an EF of 30 to 35% with no evidence of vegetations.  He had a Lexiscan Myoview in March which showed no evidence of ischemia .  Most recent echocardiogram in May showed an EF of 40 to 45% with normal LV function.  He has been doing well with no recent chest pain or worsening dyspnea.  No palpitations.  He is having severe low back pain that started few months ago and has not improved.   Past Medical History:  Diagnosis Date  . Arthritis    a. right knee  . Asthma   . Atrial tachycardia ([place])   . 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 etiology.  [name] COPD (chronic obstructive pulmonary disease) ([place])   . Coronary artery disease    a. 05/2012 Cath: severe 3VD-->CABG x 4 by Dr. Servando Snare in 1/2014[place], SVG->LCX & SVG->RPDA; b. 06/2014 MV: no ischemia/infarct.  . Diastolic dysfunction    a. 06/2015 Echo: EF 50-55%, Gr1 DD. Mild MR. Mildly dil LA. Nl RV fxn. Nl PASP.  [name] Edema    LEGS/ FEET  . Essential hypertension   . History of bronchitis   . HOH (hard of hearing)   . Hypercholesteremia    . Neuropathy   . PAF (paroxysmal atrial fibrillation) ([facility])    a. CHA2DS2VASc = 4-->Eliquis.  . Shingles   . Sleep apnea    NO CPAP  . Type II diabetes mellitus ([place])     Past Surgical History:  Procedure Laterality Date  . ANTERIOR CERVICAL DECOMP/DISCECTOMY FUSION  ~ 2009  . BACK SURGERY     CERVICAL FUSION  . CARDIAC CATHETERIZATION  05/06/2012   Significant three-vessel coronary artery disease with normal ejection fraction  . CATARACT EXTRACTION W/PHACO Right 08/15/2017   Procedure: CATARACT EXTRACTION PHACO AND INTRAOCULAR LENS PLACEMENT (IOC);  Surgeon: Birder Robson, MD;  Location: [facility];  Service: Ophthalmology;  Laterality: Right;  [place] 00:23 AP% 13.3 CDE 3.11 Fluid pack lot # [id number]  . CATARACT EXTRACTION W/PHACO Left 10/03/2017   Procedure: CATARACT EXTRACTION PHACO AND INTRAOCULAR LENS PLACEMENT (IOC);  Surgeon: Birder Robson, MD;  Location: [facility];  Service: Ophthalmology;  Laterality: Left;  Lot #[id number] H [place]: 00:21.2 AP%:13.9 CDE: 2.96  . CORONARY ANGIOPLASTY    . CORONARY ARTERY BYPASS GRAFT  05/07/2012   Procedure: CORONARY ARTERY BYPASS GRAFTING (CABG);  Surgeon: [name], MD;  Location: [facility];  Service: Open Heart Surgery;  Laterality: N/A;  times three  . INTRAOPERATIVE TRANSESOPHAGEAL ECHOCARDIOGRAM  05/07/2012   Procedure: INTRAOPERATIVE  TRANSESOPHAGEAL ECHOCARDIOGRAM;  Surgeon: [name], MD;  Location: [facility];  Service: Open Heart Surgery;  Laterality: N/A;  . KNEE ARTHROSCOPY  ~ 2000   "right" (05/06/2012)  . KNEE SURGERY  2003   right  . NECK SURGERY  2008   Plate in neck  . TEE WITHOUT CARDIOVERSION N/A 07/04/2019   Procedure: TRANSESOPHAGEAL ECHOCARDIOGRAM (TEE);  Surgeon: Nelva Bush, MD;  Location: [facility];  Service: Cardiovascular;  Laterality: N/A;     Current Outpatient Medications  Medication Sig Dispense Refill  . Albuterol Sulfate 108 (90 Base) MCG/ACT AEPB Inhale 2 puffs into the lungs every 6 (six) hours as  needed (shortness of breath).     . Artificial Tear Ointment (DRY EYES OP) Place 1 drop into both eyes daily as needed (for dry eyes).    [name] atorvastatin (LIPITOR) 40 MG tablet Take 1 tablet by mouth once daily 90 tablet 0  . Blood Glucose Monitoring Suppl (ONE TOUCH ULTRA 2) w/Device KIT Use to check blood sugar as advised up to twice a day 1 kit 0  . cetirizine (ZYRTEC) 10 MG tablet Take 10 mg by mouth daily.     . cyclobenzaprine (FLEXERIL) 10 MG tablet Take 10 mg by mouth 3 (three) times daily.    . dapagliflozin propanediol (FARXIGA) 5 MG TABS tablet Take 5 mg by mouth daily before breakfast. 30 tablet 3  . diclofenac sodium (VOLTAREN) 1 % GEL Apply 2 g topically 3 (three) times daily as needed (Use on elbow bursitis and knee). 100 g 2  . ELIQUIS 5 MG TABS tablet Take 1 tablet by mouth twice daily 180 tablet 1  . Ensure Max Protein (ENSURE MAX PROTEIN) LIQD Take 330 mLs (11 oz total) by mouth 2 (two) times daily. 660 mL 0  . fluticasone (FLONASE) 50 MCG/ACT nasal spray Place 1 spray into both nostrils daily as needed for allergies or rhinitis.    . furosemide (LASIX) 40 MG tablet Take 20-40 mg by mouth daily as needed for edema. Only as needed now. 45 tablet 3  . gabapentin (NEURONTIN) 100 MG capsule TAKE 1 CAPSULE BY MOUTH THREE TIMES DAILY 270 capsule 2  . guaiFENesin (MUCINEX) 600 MG 12 hr tablet Take 600 mg by mouth 2 (two) times daily.     [name] HYDROcodone-acetaminophen (NORCO/VICODIN) 5-325 MG tablet Take 1 tablet by mouth every 4 (four) hours as needed for moderate pain or severe pain. 30 tablet 0  . ipratropium-albuterol (DUONEB) 0.5-2.5 (3) MG/3ML SOLN Take 3 mLs by nebulization 4 (four) times daily.    [name] LANCETS ULTRA THIN MISC 1 Device by Does not apply route 4 (four) times daily -  before meals and at bedtime. 200 each 5  . levothyroxine (SYNTHROID) 88 MCG tablet Take 1 tablet (88 mcg total) by mouth daily before breakfast. 90 tablet 1  . losartan (COZAAR) 25 MG tablet Take 0.5 tablets  (12.5 mg total) by mouth daily. 45 tablet 3  . metFORMIN (GLUCOPHAGE) 500 MG tablet TAKE 2 TABLETS BY MOUTH TWICE DAILY WITH A MEAL 360 tablet 0  . metoprolol tartrate (LOPRESSOR) 50 MG tablet Take 1.5 tablets (75 mg total) by mouth 2 (two) times daily. 270 tablet 3  . montelukast (SINGULAIR) 10 MG tablet Take 10 mg by mouth at bedtime.    Glory Rosebush ULTRA test strip USE TEST STRIP(S) TO CHECK GLUCOSE TWICE DAILY 200 each 3  . predniSONE (DELTASONE) 5 MG tablet Take 5 mg by mouth daily with breakfast.    .  SYMBICORT 160-4.5 MCG/ACT inhaler Inhale 2 puffs 2 (two) times daily into the lungs.     . fluconazole (DIFLUCAN) 150 MG tablet Take one tablet by mouth on Day 1. Repeat dose 2nd tablet on Day 3. 2 tablet 1   No current facility-administered medications for this visit.    Allergies:   Bydureon [exenatide], Clindamycin/lincomycin, Penicillins, Sulfa antibiotics, Spiriferis, Spiriva handihaler [tiotropium bromide monohydrate], and Ivp dye [iodinated diagnostic agents]    Social History:  The patient  reports that he quit smoking about 51 years ago. His smoking use included cigarettes. He has a 8.00 pack-year smoking history. He has quit using smokeless tobacco. He reports that he does not drink alcohol and does not use drugs.   Family History:  The patient's family history includes Clotting disorder in his mother; Heart disease in his mother; Hypertension in his sister.    ROS:  Please see the history of present illness.   Otherwise, review of systems are positive for none.   All other systems are reviewed and negative.    PHYSICAL EXAM: VS:  BP 120/80 (BP Location: Left Arm, Patient Position: Sitting, Cuff Size: Normal)   Pulse 84   Ht 5' 11.8" (1.824 m)   Wt 196 lb 8 oz (89.1 kg)   SpO2 94%   BMI 26.80 kg/m  , BMI Body mass index is 26.8 kg/m. GEN: Well nourished, well developed, in no acute distress  HEENT: normal  Neck: no JVD, carotid bruits, or masses Cardiac: Irregularly  irregular; no murmurs, rubs, or gallops, +1 edema Respiratory: Lungs are clear, normal work of breathing GI: soft, nontender, nondistended, + BS MS: no deformity or atrophy  Skin: warm and dry, no rash Neuro:  Strength and sensation are intact Psych: euthymic mood, full affect   EKG:  EKG is ordered today. The ekg ordered today demonstrates atrial fibrillation with ventricular rate of 84 bpm.  Anterolateral ST and T wave changes suggestive of ischemia.  Recent Labs: 06/30/2019: B Natriuretic Peptide 857.0; Magnesium 2.3; TSH 2.676 07/11/2019: Hemoglobin 13.6; Platelets 306 11/21/2019: ALT 21; BUN 26; Creatinine, Ser 1.07; Potassium 4.4; Sodium 139    Lipid Panel    Component Value Date/Time   CHOL 145 11/21/2019 0915   CHOL 139 12/14/2014 0834   TRIG 99 11/21/2019 0915   HDL 67 11/21/2019 0915   HDL 43 12/14/2014 0834   CHOLHDL 2.2 11/21/2019 0915   VLDL 20 11/21/2019 0915   LDLCALC 58 11/21/2019 0915   LDLCALC 61 06/24/2018 0846      Wt Readings from Last 3 Encounters:  12/02/19 196 lb 8 oz (89.1 kg)  11/19/19 201 lb 9.6 oz (91.4 kg)  09/12/19 208 lb 6.4 oz (94.5 kg)        ASSESSMENT AND PLAN:   1.  Chronic atrial fibrillation: Ventricular rate is well controlled and he is tolerating anticoagulation with Eliquis with no side effects.  Given the lack of symptoms related to atrial fibrillation, no indication for cardioversion at some point.  I think it would be difficult to keep him in sinus rhythm anyway.  2. Coronary artery disease involving native coronary arteries without angina: He has no anginal symptoms.   3.  Chronic systolic heart failure: Most recent EF was 40 to 45%.  Continue treatment with metoprolol, losartan and Farxiga.  Given history of hypertension, I did not increase the dose of these medications.  4. Hyperlipidemia: Continue treatment with atorvastatin.  Most recent lipid profile showed an LDL of 58.  5.  Severe low back pain: This has been  persistent over the last few months.  Patient might need an MRI of the lumbar spine.  I will reach out to Dr. [name].   Disposition:   FU with me in 6 months  Signed,  [name], MD  12/02/2019 5:01 PM    [place]

## 2019-12-03 ENCOUNTER — Ambulatory Visit (INDEPENDENT_AMBULATORY_CARE_PROVIDER_SITE_OTHER): Payer: Medicare Other | Admitting: Pharmacist

## 2019-12-03 DIAGNOSIS — J432 Centrilobular emphysema: Secondary | ICD-10-CM | POA: Diagnosis not present

## 2019-12-03 DIAGNOSIS — I129 Hypertensive chronic kidney disease with stage 1 through stage 4 chronic kidney disease, or unspecified chronic kidney disease: Secondary | ICD-10-CM

## 2019-12-03 DIAGNOSIS — E785 Hyperlipidemia, unspecified: Secondary | ICD-10-CM | POA: Diagnosis not present

## 2019-12-03 DIAGNOSIS — N182 Chronic kidney disease, stage 2 (mild): Secondary | ICD-10-CM

## 2019-12-03 DIAGNOSIS — G8929 Other chronic pain: Secondary | ICD-10-CM

## 2019-12-03 DIAGNOSIS — E1169 Type 2 diabetes mellitus with other specified complication: Secondary | ICD-10-CM | POA: Diagnosis not present

## 2019-12-03 NOTE — Chronic Care Management (AMB) (Signed)
Chronic Care Management   Follow Up Note   12/03/2019 Name: Philip [name] Sr. MRN: [id number] DOB: October 02, 1945  Referred by: Philip Hauser, DO Reason for referral : Chronic Care Management (Patient Phone Call)   Philip Potters Sr. is a 74 y.o. year old male who is a primary care patient of Philip Hauser, DO. The CCM team was consulted for assistance with chronic disease management and care coordination needs.    I reached out to Philip Potters Sr. by phone today.   Review of patient status, including review of consultants reports, relevant laboratory and other test results, and collaboration with appropriate care team members and the patient's provider was performed as part of comprehensive patient evaluation and provision of chronic care management services.     Outpatient Encounter Medications as of 12/03/2019  Medication Sig  . HYDROcodone-acetaminophen (NORCO/VICODIN) 5-325 MG tablet Take 1 tablet by mouth every 4 (four) hours as needed for moderate pain or severe pain.  Philip [name] losartan (COZAAR) 25 MG tablet Take 0.5 tablets (12.5 mg total) by mouth daily.  . metFORMIN (GLUCOPHAGE) 500 MG tablet TAKE 2 TABLETS BY MOUTH TWICE DAILY WITH A MEAL  . metoprolol tartrate (LOPRESSOR) 50 MG tablet Take 1.5 tablets (75 mg total) by mouth 2 (two) times daily.  . predniSONE (DELTASONE) 5 MG tablet Take 5 mg by mouth daily with breakfast.  . SYMBICORT 160-4.5 MCG/ACT inhaler Inhale 2 puffs 2 (two) times daily into the lungs.   . Albuterol Sulfate 108 (90 Base) MCG/ACT AEPB Inhale 2 puffs into the lungs every 6 (six) hours as needed (shortness of breath).   . Artificial Tear Ointment (DRY EYES OP) Place 1 drop into both eyes daily as needed (for dry eyes).  Philip [name] atorvastatin (LIPITOR) 40 MG tablet Take 1 tablet by mouth once daily  . Blood Glucose Monitoring Suppl (ONE TOUCH ULTRA 2) w/Device KIT Use to check blood sugar as advised up to twice a day  . cetirizine (ZYRTEC) 10 MG  tablet Take 10 mg by mouth daily.   . cyclobenzaprine (FLEXERIL) 10 MG tablet Take 10 mg by mouth 3 (three) times daily.  . dapagliflozin propanediol (FARXIGA) 5 MG TABS tablet Take 5 mg by mouth daily before breakfast.  . diclofenac sodium (VOLTAREN) 1 % GEL Apply 2 g topically 3 (three) times daily as needed (Use on elbow bursitis and knee).  Philip [name] ELIQUIS 5 MG TABS tablet Take 1 tablet by mouth twice daily  . Ensure Max Protein (ENSURE MAX PROTEIN) LIQD Take 330 mLs (11 oz total) by mouth 2 (two) times daily.  . fluticasone (FLONASE) 50 MCG/ACT nasal spray Place 1 spray into both nostrils daily as needed for allergies or rhinitis.  . furosemide (LASIX) 40 MG tablet Take 20-40 mg by mouth daily as needed for edema. Only as needed now. (Patient not taking: Reported on 12/03/2019)  . gabapentin (NEURONTIN) 100 MG capsule TAKE 1 CAPSULE BY MOUTH THREE TIMES DAILY  . guaiFENesin (MUCINEX) 600 MG 12 hr tablet Take 600 mg by mouth 2 (two) times daily.   Philip [name] ipratropium-albuterol (DUONEB) 0.5-2.5 (3) MG/3ML SOLN Take 3 mLs by nebulization 4 (four) times daily.  Philip [name] LANCETS ULTRA THIN MISC 1 Device by Does not apply route 4 (four) times daily -  before meals and at bedtime.  Philip [name] levothyroxine (SYNTHROID) 88 MCG tablet Take 1 tablet (88 mcg total) by mouth daily before breakfast.  . montelukast (SINGULAIR) 10 MG tablet Take 10 mg by mouth at bedtime.  Philip [name]  ONETOUCH ULTRA test strip USE TEST STRIP(S) TO CHECK GLUCOSE TWICE DAILY  . [DISCONTINUED] fluconazole (DIFLUCAN) 150 MG tablet Take one tablet by mouth on Day 1. Repeat dose 2nd tablet on Day 3.   No facility-administered encounter medications on file as of 12/03/2019.    Goals Addressed            This Visit's Progress   . PharmD - Medication Management       CARE PLAN ENTRY (see longitudinal plan of care for additional care plan information)  Current Barriers:  Philip [name] Knowledge deficits related to dietary choices to improve blood sugar control . Financial -  currently in coverage gap of Medicare Part D plan  Pharmacist Clinical Goal(s):  Philip [name] Over the next 30 days, patient will work with CM Pharmacist to address needs related to medication regimen optimization.  Interventions: . Receive coordination of care message from PCP . Perform chart review . Follow up with patient regarding back and hip pain o [name] reports his pain is relatively better today (rates pain as 6/10) o Reports taking hydrocodone/acetaminophen 5-325 mg as directed by PCP as needed, typically ~ twice daily  Caution for side effects. Patient denies sedation, dizziness or constipation  Reports typically lays down after taking hydrocodone  Counsel to take with food . Counsel to schedule follow up visit with [facility] as directed by PCP.  o Patient confirms having this phone numer and states will call to schedule today. . Follow up with patient regarding yeast infection and fluconazole course o Reports completed course of fluconazole as directed and infection resolved o Note patient taking [place]. SGLT2 inhibitors associated with increase risk of genital yeast infection  Counsel on prevention strategies . Follow up regarding blood sugar control and monitoring o Reports currently taking  metformin 500 mg - 2 tablets (1,000 mg) twice daily as directed  Farxiga 5 mg once daily  Note patient continues on prednisone 5 mg once daily as directed by pulmonologist o Review recent blood sugar results (see below) o Counsel on importance of well-balanced diet and limiting carbohydrate portions sizes  Reports has been trying to lose weight.   Reports a reduced appetite since he has been in pain  Reports eating more vegetables o Patient noted his ability to exercise is limited by knee and hip pain . Discuss blood pressure/ HR monitoring and control o Reports taking:  Losartan 25 mg - 1/2 tablet (12.5 mg) once daily  Metoprolol 75 mg twice  daily  Furosemide ONLY as needed as directed by Cardiologist - denies needing recently o Review recent BP/HR results (see below)  Attributes recent elevated BP readings to pain in right knee and in hips . Have counseled patient on importance of continuing to weigh himself daily and follow directions from Cardiologist for use of PRN furosemide (Lasix).  Philip [name] Counsel patient on importance of contacting Cardiologist for BP and HR readings outside of established parameters . Discuss importance of medication adherence o Reports restarted using Symbicort twice daily as directed o Note patient uses weekly pillbox to organize medications . Have counseled on COVID-19 vaccinations and continued importance of COVID-19 prevention o Stated that he is not currently ready to receive the vaccine due to concerns about side effects. o Stated would consider receiving in the future  Patient Self Care Activities:  . Self administers medications as prescribed . Attends all scheduled provider appointments . Calls pharmacy for medication refills . Calls provider office for new concerns or questions  .  Patient to check blood sugar regularly as directed and keep log  Fasting Blood Glucose After Supper  (~2 hours)  29 - July 127 210  30 - July 115 220  31 - July 127 151  1 - August 146 210  2 - August 118 136  3 - August 103 185  4 - August -   Average 123 185   . Patient to keep log when checks blood pressure  AM BP PM BP  29 - July    30 - July 142/111, HR 81 147/90, HR 78  31 - July 124/99, HR 76 147/98, HR 78  1 - August 121/96, HR 69 142/92, HR 82  2 - August 146/110, HR 59 114/91, HR 89  3 - August 153/94, HR 83 -  4 - August      Please see past updates related to this goal by clicking on the "Past Updates" button in the selected goal          Plan  Telephone follow up appointment with care management team member scheduled for: 9/1 at 9 am  [name], PharmD, [place] [phone]

## 2019-12-03 NOTE — Patient Instructions (Signed)
Thank you allowing the Chronic Care Management Team to be a part of your care! It was a pleasure speaking with you today!     CCM (Chronic Care Management) Team    Noreene Larsson RN, MSN, CCM Nurse Care Coordinator  [phone]   [name] PharmD  Clinical Pharmacist  [phone]   Eula Fried LCSW Clinical Social Worker [phone]  Visit Information  Goals Addressed            This Visit's Progress   . PharmD - Medication Management       CARE PLAN ENTRY (see longitudinal plan of care for additional care plan information)  Current Barriers:  [name] Knowledge deficits related to dietary choices to improve blood sugar control . Financial - currently in coverage gap of Medicare Part D plan  Pharmacist Clinical Goal(s):  [name] Over the next 30 days, patient will work with CM Pharmacist to address needs related to medication regimen optimization.  Interventions: . Receive coordination of care message from PCP . Perform chart review . Follow up with patient regarding back and hip pain o Mr. Ambrosio reports his pain is relatively better today (rates pain as 6/10) o Reports taking hydrocodone/acetaminophen 5-325 mg as directed by PCP as needed, typically ~ twice daily  Caution for side effects. Patient denies sedation, dizziness or constipation  Reports typically lays down after taking hydrocodone  Counsel to take with food . Counsel to schedule follow up visit with [facility] as directed by PCP.  o Patient confirms having this phone numer and states will call to schedule today. . Follow up with patient regarding yeast infection and fluconazole course o Reports completed course of fluconazole as directed and infection resolved o Note patient taking [place]. SGLT2 inhibitors associated with increase risk of genital yeast infection  Counsel on prevention strategies . Follow up regarding blood sugar control and monitoring o Reports currently  taking  metformin 500 mg - 2 tablets (1,000 mg) twice daily as directed  Farxiga 5 mg once daily  Note patient continues on prednisone 5 mg once daily as directed by pulmonologist o Review recent blood sugar results (see below) o Counsel on importance of well-balanced diet and limiting carbohydrate portions sizes  Reports has been trying to lose weight.   Reports a reduced appetite since he has been in pain  Reports eating more vegetables o Patient noted his ability to exercise is limited by knee and hip pain . Discuss blood pressure/ HR monitoring and control o Reports taking:  Losartan 25 mg - 1/2 tablet (12.5 mg) once daily  Metoprolol 75 mg twice daily  Furosemide ONLY as needed as directed by Cardiologist - denies needing recently o Review recent BP/HR results (see below)  Attributes recent elevated BP readings to pain in right knee and in hips . Have counseled patient on importance of continuing to weigh himself daily and follow directions from Cardiologist for use of PRN furosemide (Lasix).  [name] Counsel patient on importance of contacting Cardiologist for BP and HR readings outside of established parameters . Discuss importance of medication adherence o Reports restarted using Symbicort twice daily as directed o Note patient uses weekly pillbox to organize medications . Have counseled on COVID-19 vaccinations and continued importance of COVID-19 prevention o Stated that he is not currently ready to receive the vaccine due to concerns about side effects. o Stated would consider receiving in the future  Patient Self Care Activities:  . Self administers medications as prescribed .  Attends all scheduled provider appointments . Calls pharmacy for medication refills . Calls provider office for new concerns or questions  . Patient to check blood sugar regularly as directed and keep log  Fasting Blood Glucose After Supper  (~2 hours)  29 - July 127 210  30 - July 115 220   31 - July 127 151  1 - August 146 210  2 - August 118 136  3 - August 103 185  4 - August -   Average 123 185   . Patient to keep log when checks blood pressure  AM BP PM BP  29 - July    30 - July 142/111, HR 81 147/90, HR 78  31 - July 124/99, HR 76 147/98, HR 78  1 - August 121/96, HR 69 142/92, HR 82  2 - August 146/110, HR 59 114/91, HR 89  3 - August 153/94, HR 83 -  4 - August      Please see past updates related to this goal by clicking on the "Past Updates" button in the selected goal         Patient verbalizes understanding of instructions provided today.   Telephone follow up appointment with care management team member scheduled for: 9/1 at 9 am  [name], PharmD, [place] [phone]

## 2019-12-05 ENCOUNTER — Other Ambulatory Visit: Payer: Self-pay | Admitting: Orthopedic Surgery

## 2019-12-05 DIAGNOSIS — G8929 Other chronic pain: Secondary | ICD-10-CM

## 2019-12-09 ENCOUNTER — Other Ambulatory Visit: Payer: Self-pay | Admitting: Family Medicine

## 2019-12-09 DIAGNOSIS — M1611 Unilateral primary osteoarthritis, right hip: Secondary | ICD-10-CM

## 2019-12-09 DIAGNOSIS — G8929 Other chronic pain: Secondary | ICD-10-CM

## 2019-12-09 DIAGNOSIS — M25551 Pain in right hip: Secondary | ICD-10-CM

## 2019-12-09 MED ORDER — HYDROCODONE-ACETAMINOPHEN 5-325 MG PO TABS: 1.0000 | ORAL_TABLET | ORAL | 0 refills | Status: DC | PRN

## 2019-12-09 NOTE — Telephone Encounter (Signed)
Pt called and is requesting to have a refill on hydrocodone due to his MRI not being scheduled until 12/23/19. Please advise .    Philip [name] (N), [facility] [place] [address]  Phone: [phone] Fax: [phone]  Hours: Not open 24 hours

## 2019-12-09 NOTE — Telephone Encounter (Signed)
Requested medication (s) are due for refill today: no  Requested medication (s) are on the active medication list: yes  Last refill:  12/01/2019  Future visit scheduled:no  Notes to clinic:  this refill cannot be delegated  Patient would like refill do to MRI not be scheduled    Requested Prescriptions  Pending Prescriptions Disp Refills   HYDROcodone-acetaminophen (NORCO/VICODIN) 5-325 MG tablet 30 tablet 0    Sig: Take 1 tablet by mouth every 4 (four) hours as needed for moderate pain or severe pain.      Not Delegated - Analgesics:  Opioid Agonist Combinations Failed - 12/09/2019  2:25 PM      Failed - This refill cannot be delegated      Failed - Urine Drug Screen completed in last 360 days.      Passed - Valid encounter within last 6 months    Recent Outpatient Visits           2 weeks ago Chronic right hip pain   [place], DO   3 months ago Acute idiopathic gout of right foot   [place], [name], DO   5 months ago COPD with acute exacerbation [facility])   [facility] [name], DO   9 months ago Type 2 diabetes mellitus with peripheral neuropathy [facility])   [place], DO   1 year ago Type 2 diabetes mellitus with peripheral neuropathy [facility])   [facility], [name], DO

## 2019-12-23 ENCOUNTER — Ambulatory Visit
Admission: RE | Admit: 2019-12-23 | Discharge: 2019-12-23 | Disposition: A | Discharge status: Home / Self Care | Payer: Medicare Other | Source: Ambulatory Visit | Attending: Orthopedic Surgery | Admitting: Orthopedic Surgery

## 2019-12-23 ENCOUNTER — Other Ambulatory Visit: Payer: Self-pay

## 2019-12-23 DIAGNOSIS — G8929 Other chronic pain: Secondary | ICD-10-CM

## 2019-12-23 DIAGNOSIS — M5127 Other intervertebral disc displacement, lumbosacral region: Secondary | ICD-10-CM | POA: Diagnosis not present

## 2019-12-23 DIAGNOSIS — M48061 Spinal stenosis, lumbar region without neurogenic claudication: Secondary | ICD-10-CM | POA: Diagnosis not present

## 2019-12-23 DIAGNOSIS — M5126 Other intervertebral disc displacement, lumbar region: Secondary | ICD-10-CM | POA: Diagnosis not present

## 2019-12-23 DIAGNOSIS — M5441 Lumbago with sciatica, right side: Secondary | ICD-10-CM | POA: Diagnosis not present

## 2019-12-23 DIAGNOSIS — M4807 Spinal stenosis, lumbosacral region: Secondary | ICD-10-CM | POA: Diagnosis not present

## 2019-12-25 DIAGNOSIS — M5416 Radiculopathy, lumbar region: Secondary | ICD-10-CM | POA: Diagnosis not present

## 2019-12-25 DIAGNOSIS — M5126 Other intervertebral disc displacement, lumbar region: Secondary | ICD-10-CM | POA: Diagnosis not present

## 2019-12-25 DIAGNOSIS — M5136 Other intervertebral disc degeneration, lumbar region: Secondary | ICD-10-CM | POA: Diagnosis not present

## 2019-12-25 DIAGNOSIS — M5417 Radiculopathy, lumbosacral region: Secondary | ICD-10-CM | POA: Diagnosis not present

## 2019-12-26 DIAGNOSIS — M5416 Radiculopathy, lumbar region: Secondary | ICD-10-CM | POA: Diagnosis not present

## 2019-12-26 DIAGNOSIS — M5126 Other intervertebral disc displacement, lumbar region: Secondary | ICD-10-CM | POA: Diagnosis not present

## 2019-12-31 ENCOUNTER — Other Ambulatory Visit: Payer: Self-pay

## 2019-12-31 ENCOUNTER — Ambulatory Visit (INDEPENDENT_AMBULATORY_CARE_PROVIDER_SITE_OTHER): Payer: Medicare Other | Admitting: Family Medicine

## 2019-12-31 ENCOUNTER — Encounter: Payer: Self-pay | Admitting: Family Medicine

## 2019-12-31 ENCOUNTER — Ambulatory Visit: Payer: Medicare Other | Admitting: Pharmacist

## 2019-12-31 DIAGNOSIS — J432 Centrilobular emphysema: Secondary | ICD-10-CM

## 2019-12-31 DIAGNOSIS — J019 Acute sinusitis, unspecified: Secondary | ICD-10-CM

## 2019-12-31 DIAGNOSIS — E1169 Type 2 diabetes mellitus with other specified complication: Secondary | ICD-10-CM

## 2019-12-31 DIAGNOSIS — H1033 Unspecified acute conjunctivitis, bilateral: Secondary | ICD-10-CM

## 2019-12-31 DIAGNOSIS — R509 Fever, unspecified: Secondary | ICD-10-CM | POA: Diagnosis not present

## 2019-12-31 DIAGNOSIS — I129 Hypertensive chronic kidney disease with stage 1 through stage 4 chronic kidney disease, or unspecified chronic kidney disease: Secondary | ICD-10-CM

## 2019-12-31 MED ORDER — POLYMYXIN B-TRIMETHOPRIM 10000-0.1 UNIT/ML-% OP SOLN: 1.0000 [drp] | OPHTHALMIC | 0 refills | Status: DC

## 2019-12-31 NOTE — Patient Instructions (Signed)
Thank you allowing the Chronic Care Management Team to be a part of your care! It was a pleasure speaking with you today!     CCM (Chronic Care Management) Team    Noreene Larsson RN, MSN, CCM Nurse Care Coordinator  [phone]   [name] PharmD  Clinical Pharmacist  [phone]   Eula Fried LCSW Clinical Social Worker [phone]  Visit Information  Goals Addressed            This Visit's Progress    PharmD - Medication Management       CARE PLAN ENTRY (see longitudinal plan of care for additional care plan information)  Current Barriers:   Knowledge deficits related to dietary choices to improve blood sugar control  Financial - currently in coverage gap of Medicare Part D plan  Pharmacist Clinical Goal(s):   Over the next 30 days, patient will work with CM Pharmacist to address needs related to medication regimen optimization.  Interventions:  Follow up with patient regarding back and hip pain o Patient reports received a steroid injection in lower back last week(on 8/27 per chart), which has provided some relief o Reports also taking acetaminophen and hydrocodone/acetaminophen Rx as needed as directed  Reports has been trying to limit hydrocodone use. Admits to sedation with use and states he typically lays down after taking. Encourage patient to continue caution with use for dizziness/sedation  Reports taking 1-3 tablets of acetaminophen/day  Follow up regarding blood sugar control and monitoring o Reports currently taking  metformin 500 mg - 2 tablets (1,000 mg) twice daily as directed  Farxiga 5 mg once daily  Note patient continues on prednisone 5 mg once daily as directed by pulmonologist o Review recent blood sugar results (see below)  Counsel patient on temporary impact of steroid injections on blood sugar readings; note elevation following injection  Patient also notes an elevation related to dietary indiscretion (banana  milkshake)  Have counseled patient on importance of contacting Cardiologist for BP and HR readings outside of established parameters  Counsel to follow up with PCP today  o Patient reports has had significant pressure behind his eyes for past 2-3 days and noticed eyes to be red o Reports had a fever early this morning over 100 degrees, but cannot recall reading; denies feeling feverish now o Denies any increase in shortness of breath or mucus - states "my breathing has been pretty good"  Reports taking pulmonary medications as directed, including Symbicort twice daily as directed o Denies other symptoms o Confirms having phone number and states will call office now  Patient Self Care Activities:   Self administers medications as prescribed  Attends all scheduled provider appointments o Next appointment for steroid injection scheduled for 9/24  Calls pharmacy for medication refills  Calls provider office for new concerns or questions   Patient to check blood sugar regularly as directed and keep log  Fasting Blood Glucose After Supper  (~2 hours) Notes  26 - August 119 209   27 - August 120 296 *Received cortisone injection  28 - August 200 192   29 - August 163 178   30 - August  133 376* *Had banana milkshake  31 - August 125 115 Poor appetite  1 - September -      Patient to keep log when checks blood pressure   Please see past updates related to this goal by clicking on the "Past Updates" button in the selected goal  Patient verbalizes understanding of instructions provided today.   Telephone follow up appointment with care management team member scheduled for: 10/6 at 9:45 am  [name], PharmD, [place] [phone]

## 2019-12-31 NOTE — Chronic Care Management (AMB) (Signed)
Chronic Care Management   Follow Up Note   12/31/2019 Name: LAURIE LOVEJOY Sr. MRN: [id number] DOB: 06/20/1945  Referred by: [name], DO Reason for referral : Chronic Care Management (Patient Phone Call)   [name] is a 74 y.o. year old male who is a primary care patient of [name], DO. The CCM team was consulted for assistance with chronic disease management and care coordination needs.    I reached out to [name] by phone today.   Review of patient status, including review of consultants reports, relevant laboratory and other test results, and collaboration with appropriate care team members and the patient's provider was performed as part of comprehensive patient evaluation and provision of chronic care management services.    SDOH (Social Determinants of Health) assessments performed: No See Care Plan activities for detailed interventions related to [facility])     Outpatient Encounter Medications as of 12/31/2019  Medication Sig  . acetaminophen (TYLENOL) 500 MG tablet Take 500 mg by mouth every 6 (six) hours as needed.  . cetirizine (ZYRTEC) 10 MG tablet Take 10 mg by mouth daily.   . dapagliflozin propanediol (FARXIGA) 5 MG TABS tablet Take 5 mg by mouth daily before breakfast.  . fluticasone (FLONASE) 50 MCG/ACT nasal spray Place 1 spray into both nostrils daily as needed for allergies or rhinitis.  [name] HYDROcodone-acetaminophen (NORCO/VICODIN) 5-325 MG tablet Take 1 tablet by mouth every 4 (four) hours as needed for moderate pain or severe pain.  . metFORMIN (GLUCOPHAGE) 500 MG tablet TAKE 2 TABLETS BY MOUTH TWICE DAILY WITH A MEAL  . montelukast (SINGULAIR) 10 MG tablet Take 10 mg by mouth at bedtime.  . predniSONE (DELTASONE) 5 MG tablet Take 5 mg by mouth daily with breakfast.  . SYMBICORT 160-4.5 MCG/ACT inhaler Inhale 2 puffs 2 (two) times daily into the lungs.   . Albuterol Sulfate 108 (90 Base) MCG/ACT AEPB Inhale 2 puffs  into the lungs every 6 (six) hours as needed (shortness of breath).   . Artificial Tear Ointment (DRY EYES OP) Place 1 drop into both eyes daily as needed (for dry eyes).  [name] atorvastatin (LIPITOR) 40 MG tablet Take 1 tablet by mouth once daily  . Blood Glucose Monitoring Suppl (ONE TOUCH ULTRA 2) w/Device KIT Use to check blood sugar as advised up to twice a day  . cyclobenzaprine (FLEXERIL) 10 MG tablet Take 10 mg by mouth 3 (three) times daily.  . diclofenac sodium (VOLTAREN) 1 % GEL Apply 2 g topically 3 (three) times daily as needed (Use on elbow bursitis and knee).  [name] ELIQUIS 5 MG TABS tablet Take 1 tablet by mouth twice daily  . Ensure Max Protein (ENSURE MAX PROTEIN) LIQD Take 330 mLs (11 oz total) by mouth 2 (two) times daily.  . furosemide (LASIX) 40 MG tablet Take 20-40 mg by mouth daily as needed for edema. Only as needed now. (Patient not taking: Reported on 12/03/2019)  . gabapentin (NEURONTIN) 100 MG capsule TAKE 1 CAPSULE BY MOUTH THREE TIMES DAILY  . guaiFENesin (MUCINEX) 600 MG 12 hr tablet Take 600 mg by mouth 2 (two) times daily.   [name] ipratropium-albuterol (DUONEB) 0.5-2.5 (3) MG/3ML SOLN Take 3 mLs by nebulization 4 (four) times daily.  [name] LANCETS ULTRA THIN MISC 1 Device by Does not apply route 4 (four) times daily -  before meals and at bedtime.  [name] levothyroxine (SYNTHROID) 88 MCG tablet Take 1 tablet (88 mcg total) by mouth  daily before breakfast.  . losartan (COZAAR) 25 MG tablet Take 0.5 tablets (12.5 mg total) by mouth daily.  . metoprolol tartrate (LOPRESSOR) 50 MG tablet Take 1.5 tablets (75 mg total) by mouth 2 (two) times daily.  Glory Rosebush ULTRA test strip USE TEST STRIP(S) TO CHECK GLUCOSE TWICE DAILY   No facility-administered encounter medications on file as of 12/31/2019.    Goals Addressed            This Visit's Progress   . PharmD - Medication Management       CARE PLAN ENTRY (see longitudinal plan of care for additional care plan information)  Current  Barriers:  [name] Knowledge deficits related to dietary choices to improve blood sugar control . Financial - currently in coverage gap of Medicare Part D plan  Pharmacist Clinical Goal(s):  [name] Over the next 30 days, patient will work with CM Pharmacist to address needs related to medication regimen optimization.  Interventions: . Follow up with patient regarding back and hip pain o Patient reports received a steroid injection in lower back last week(on 8/27 per chart), which has provided some relief o Reports also taking acetaminophen and hydrocodone/acetaminophen Rx as needed as directed  Reports has been trying to limit hydrocodone use. Admits to sedation with use and states he typically lays down after taking. Encourage patient to continue caution with use for dizziness/sedation  Reports taking 1-3 tablets of acetaminophen/day . Follow up regarding blood sugar control and monitoring o Reports currently taking  metformin 500 mg - 2 tablets (1,000 mg) twice daily as directed  Farxiga 5 mg once daily  Note patient continues on prednisone 5 mg once daily as directed by pulmonologist o Review recent blood sugar results (see below)  Counsel patient on temporary impact of steroid injections on blood sugar readings; note elevation following injection  Patient also notes an elevation related to dietary indiscretion (banana milkshake) . Have counseled patient on importance of contacting Cardiologist for BP and HR readings outside of established parameters . Counsel to follow up with PCP today  o Patient reports has had significant pressure behind his eyes for past 2-3 days and noticed eyes to be red o Reports had a fever early this morning over 100 degrees, but cannot recall reading; denies feeling feverish now o Denies any increase in shortness of breath or mucus - states "my breathing has been pretty good"  Reports taking pulmonary medications as directed, including Symbicort twice daily as  directed o Denies other symptoms o Confirms having phone number and states will call office now  Patient Self Care Activities:  . Self administers medications as prescribed . Attends all scheduled provider appointments o Next appointment for steroid injection scheduled for 9/24 . Calls pharmacy for medication refills . Calls provider office for new concerns or questions  . Patient to check blood sugar regularly as directed and keep log  Fasting Blood Glucose After Supper  (~2 hours) Notes  26 - August 119 209   27 - August 120 296 *Received cortisone injection  28 - August 200 192   29 - August 163 178   30 - August  133 376* *Had banana milkshake  31 - August 125 115 Poor appetite  1 - September -     . Patient to keep log when checks blood pressure   Please see past updates related to this goal by clicking on the "Past Updates" button in the selected goal  Plan  Telephone follow up appointment with care management team member scheduled for: 10/6 at 9:45 am  [name], PharmD, [place] [phone]

## 2019-12-31 NOTE — Progress Notes (Signed)
Virtual Visit via Telephone The purpose of this virtual visit is to provide medical care while limiting exposure to the novel coronavirus (COVID19) for both patient and office staff.  Consent was obtained for phone visit:  Yes.   Answered questions that patient had about telehealth interaction:  Yes.   I discussed the limitations, risks, security and privacy concerns of performing an evaluation and management service by telephone. I also discussed with the patient that there may be a patient responsible charge related to this service. The patient expressed understanding and agreed to proceed.  Patient Location: Home Provider Location: [facility])   ---------------------------------------------------------------------- Chief Complaint  Patient presents with  . Headache    S: Reviewed CMA documentation. I have called patient and gathered additional HPI as follows:  Headache / Fever Sinus pressure Conjunctivitis  Reports that symptoms started 3 days ago, had low grade temp 99.7 to 100.71F low grade, not taken any tylenol or other medicine. Admits eyes bloodshot eyes. Admits dry itchy irritated eyes, has some eye drops. Admits crusty discharge from eyes - Still taking Prednisone 5mg  daily - He has Flonase, limited relief - Unvaccinated to COVID. No new exposures  Denies any known or suspected exposure to person with or possibly with COVID19.  Admits low grade fever Denies any chills, sweats, body ache, cough, shortness of breath, abdominal pain, diarrhea  -------------------------------------------------------------------------- O: No physical exam performed due to remote telephone encounter.  -------------------------------------------------------------------------- A&P:   Suspected acute conjunctivitis / sinusitis Possibly allergic vs viral etiology Limited by telephonic visit, has some persistent eye symptoms and discharge by report, does not sound  purulent Low grade temp Unvaccinated to covid but cannot rule out  Offer COVID19 test at pharmacy or home or clinic, he declines today will wait 24-48 hours, info given on testing  1. Start Polytrim antibiotic eyedrops for empiric coverage, again limited by virtual will offer this now, 1 drop each eye every 4 hours 2. Start moist warm compresses over both eyelid 10-15 min at a time, up to 4-6 times daily until resolution 3. Frequent hand washing, avoid rubbing / scratching eye  Keep using allergy/sinus treatment Defer oral antibiotics or prednisone therapy, he is on chronic prednisone already, no change at this time.  Follow-up 1-2 weeks as needed    Meds ordered this encounter  Medications  . DISCONTD: trimethoprim-polymyxin b (POLYTRIM) ophthalmic solution    Sig: Place 1 drop into both eyes every 4 (four) hours.    Dispense:  10 mL    Refill:  0  . trimethoprim-polymyxin b (POLYTRIM) ophthalmic solution    Sig: Place 1 drop into both eyes every 4 (four) hours. For up to 7 days or until resolved.    Dispense:  10 mL    Refill:  0    Re-sent with additional instruction. Thank you! Please disregard initial rx, should be for up to 7 days   OPTIONAL RECOMMENDED self quarantine for patient safety for PREVENTION ONLY. It is not required based on current clinical symptoms. If they were to develop fever or worsening shortness of breath, then emphasis on REQUIRED quarantine for up to 7-14 days that could be resolved if fever free >3 days AND if symptoms improving after 7 days.   If symptoms do not resolve or significantly improve OR if WORSENING - fever / cough - or worsening shortness of breath - then should contact us and seek advice on next steps in treatment at home vs where/when to seek care  at Urgent Care or Hospital ED for further intervention and possible testing if indicated.  Patient verbalizes understanding with the above medical recommendations including the limitation of remote  medical advice.  Specific follow-up / call-back criteria were given for patient to follow-up or seek medical care more urgently if needed.   - Time spent in direct consultation with patient on phone: 10 minutes  [name], [facility] 12/31/2019, 11:30 AM

## 2020-01-05 ENCOUNTER — Other Ambulatory Visit: Payer: Self-pay | Admitting: Cardiovascular Disease

## 2020-01-07 ENCOUNTER — Telehealth: Payer: Self-pay

## 2020-01-10 ENCOUNTER — Other Ambulatory Visit: Payer: Self-pay | Admitting: Family Medicine

## 2020-01-10 DIAGNOSIS — E1142 Type 2 diabetes mellitus with diabetic polyneuropathy: Secondary | ICD-10-CM

## 2020-01-10 NOTE — Telephone Encounter (Signed)
Requested Prescriptions  Pending Prescriptions Disp Refills  . ONETOUCH ULTRA test strip [employer] Med Name: OneTouch Ultra Blue In Vitro Strip] 150 each 0    Sig: USE TEST STRIP(S) TO CHECK GLUCOSE TWICE DAILY     Endocrinology: Diabetes - Testing Supplies Passed - 01/10/2020 12:53 PM      Passed - Valid encounter within last 12 months    Recent Outpatient Visits          1 week ago Acute conjunctivitis of both eyes, unspecified acute conjunctivitis type   [place], DO   1 month ago Chronic right hip pain   [place], DO   4 months ago Acute idiopathic gout of right foot   [facility] [place], [name], DO   6 months ago COPD with acute exacerbation [facility])   [place], DO   10 months ago Type 2 diabetes mellitus with peripheral neuropathy [facility])   [facility], [name], DO

## 2020-01-23 DIAGNOSIS — M5126 Other intervertebral disc displacement, lumbar region: Secondary | ICD-10-CM | POA: Diagnosis not present

## 2020-01-23 DIAGNOSIS — M5416 Radiculopathy, lumbar region: Secondary | ICD-10-CM | POA: Diagnosis not present

## 2020-02-04 ENCOUNTER — Ambulatory Visit (INDEPENDENT_AMBULATORY_CARE_PROVIDER_SITE_OTHER): Payer: Medicare Other | Admitting: Pharmacist

## 2020-02-04 ENCOUNTER — Telehealth: Payer: Self-pay | Admitting: Family Medicine

## 2020-02-04 DIAGNOSIS — B3749 Other urogenital candidiasis: Secondary | ICD-10-CM

## 2020-02-04 DIAGNOSIS — I129 Hypertensive chronic kidney disease with stage 1 through stage 4 chronic kidney disease, or unspecified chronic kidney disease: Secondary | ICD-10-CM

## 2020-02-04 DIAGNOSIS — E785 Hyperlipidemia, unspecified: Secondary | ICD-10-CM | POA: Diagnosis not present

## 2020-02-04 DIAGNOSIS — N182 Chronic kidney disease, stage 2 (mild): Secondary | ICD-10-CM | POA: Diagnosis not present

## 2020-02-04 DIAGNOSIS — E1169 Type 2 diabetes mellitus with other specified complication: Secondary | ICD-10-CM

## 2020-02-04 MED ORDER — FLUCONAZOLE 150 MG PO TABS: ORAL_TABLET | ORAL | 3 refills | Status: DC

## 2020-02-04 NOTE — Patient Instructions (Signed)
Thank you allowing the Chronic Care Management Team to be a part of your care! It was a pleasure speaking with you today!     CCM (Chronic Care Management) Team    Noreene Larsson RN, MSN, CCM Nurse Care Coordinator  [phone]   [name] PharmD  Clinical Pharmacist  [phone]   Eula Fried LCSW Clinical Social Worker [phone]  Visit Information  Goals Addressed            This Visit's Progress   . PharmD - Medication Management       CARE PLAN ENTRY (see longitudinal plan of care for additional care plan information)  Current Barriers:  [name] Knowledge deficits related to dietary choices to improve blood sugar control . Financial - currently in coverage gap of Medicare Part D plan  Pharmacist Clinical Goal(s):  [name] Over the next 30 days, patient will work with CM Pharmacist to address needs related to medication regimen optimization.  Interventions: . Perform chart review . Follow up with patient regarding back and hip pain o Patient reports received another steroid injection in lower back on 9/24 o Next appointment with Physical Medicine and Rehabilitation on 10/15 . Reports believes has another genital yeast infection. Reports has kept area clean and dry. Reports having itching and white residue in genital area o Note patient previously seen by PCP on 7/21 for genital yeast infection and treated with course of fluconazole  o Note patient taking [place]. SGLT2 inhibitors associated with increase risk of genital yeast infection o Advise patient to follow up with PCP regarding concern for yeast infection . Follow up regarding blood sugar control and monitoring o Reports currently taking  metformin 500 mg - 2 tablets (1,000 mg) twice daily as directed  Farxiga 5 mg once daily  Note patient continues on prednisone 5 mg once daily as directed by pulmonologist o Review recent blood sugar results:  Fasting Blood Glucose After Supper  (~2 hours) Notes  30 -  September - -   1 - October 184 210   2 - October 131 223   3 - October 143 251   4 - October 147 190   5 - October 136 354* *Pasta, ice cream and cobbler  6 - October 153    Average 149 246     Patient attributes some elevations to dietary indiscretions  Again discuss importance of limiting carbohydrate portion sizes and well-balanced meals . Discuss blood pressure/HR monitoring and control o Reports taking:  Losartan 25 mg - 1/2 tablet (12.5 mg) once daily  Metoprolol 75 mg twice daily  Furosemide ONLY as needed as directed by Cardiologist - denies needing recently o Review recent BP/HR results   AM BP PM BP  30 - September 101/88, HR 113 140/88, HR 78  1 - October - -  2 - October 130/83, HR 72 134/89, HR 88  3 - October 147/99, HR 88 104/75, HR 88  4 - October 115/82, HR 72 128/84, HR 95  5 - October 137/93, HR 92 108/75, HR 95  6 - October 128/82, HR 69    o Reports often catches himself continuing to move around while taking readings  o Counsel on BP monitoring technique, including importance of resting 5 minutes prior to reading and sitting still during reading o Have counseled patient on importance of continuing to weigh himself daily and follow directions from Cardiologist for use of PRN furosemide (Lasix).  o Have counseled patient on importance of contacting  Cardiologist for BP and HR readings outside of established parameters . Discuss importance of medication adherence o Note patient uses weekly pillbox to organize medications o Reports not often, but sometimes misses an evening dose. Counsel on strategies to aid with adherence, including use of daily alarm o Reports using maintenance (Symbicort) inhaler, rescue inhaler/nebulizer solution, and other respiratory medications as directed. Confirms rinsing out mouth after use of Symbicort.  Encourage patient to follow up with pulmonologist at upcoming appointment regarding prednisone . Again counsel on COVID-19  vaccinations and continued importance of COVID-19 prevention ? Stated that he is not currently ready to receive the vaccine due to concerns about side effects. ? Stated would consider receiving in the future . Will collaborate with PCP regarding diabetes medication management  Patient Self Care Activities:  . Self administers medications as prescribed . Attends all scheduled provider appointments o Next appointment with Physical Medicine and Rehabilitation on 10/15 o Next appointment with Pulmonology on 10/18 . Calls pharmacy for medication refills . Calls provider office for new concerns or questions  . Patient to check blood sugar regularly as directed and keep log . Patient to keep log when checks blood pressure   Please see past updates related to this goal by clicking on the "Past Updates" button in the selected goal         Patient verbalizes understanding of instructions provided today.   Telephone follow up appointment with care management team member scheduled for: 03/17/20 at 9 am  [name], PharmD, [place] [phone]

## 2020-02-04 NOTE — Telephone Encounter (Signed)
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 He will need A1c and to discuss.  Called patient back, reviewed above message and plan, sent diflucan now, he will call main scheduling # and schedule to see me for in office f/u DM A1c 4-6 weeks  [name], DO [facility] 02/04/2020, 5:06 PM

## 2020-02-04 NOTE — Chronic Care Management (AMB) (Signed)
Chronic Care Management   Follow Up Note   02/04/2020 Name: Philip KARNES Sr. MRN: [id number] DOB: May 12, 1945  Referred by: [name], DO Reason for referral : Chronic Care Management (Patient Phone Call)   Philip Potters Sr. is a 74 y.o. year old male who is a primary care patient of [name], DO. The CCM team was consulted for assistance with chronic disease management and care coordination needs.    I reached out to Philip Potters Sr. by phone today.   Review of patient status, including review of consultants reports, relevant laboratory and other test results, and collaboration with appropriate care team members and the patient's provider was performed as part of comprehensive patient evaluation and provision of chronic care management services.    SDOH (Social Determinants of Health) assessments performed: No See Care Plan activities for detailed interventions related to [facility])     Outpatient Encounter Medications as of 02/04/2020  Medication Sig  . Albuterol Sulfate 108 (90 Base) MCG/ACT AEPB Inhale 2 puffs into the lungs every 6 (six) hours as needed (shortness of breath).   Philip [name] atorvastatin (LIPITOR) 40 MG tablet Take 1 tablet by mouth once daily  . cetirizine (ZYRTEC) 10 MG tablet Take 10 mg by mouth daily.   . dapagliflozin propanediol (FARXIGA) 5 MG TABS tablet Take 5 mg by mouth daily before breakfast.  . fluticasone (FLONASE) 50 MCG/ACT nasal spray Place 1 spray into both nostrils daily as needed for allergies or rhinitis.  Philip [name] gabapentin (NEURONTIN) 100 MG capsule TAKE 1 CAPSULE BY MOUTH THREE TIMES DAILY  . ipratropium-albuterol (DUONEB) 0.5-2.5 (3) MG/3ML SOLN Take 3 mLs by nebulization 4 (four) times daily.  Philip [name] losartan (COZAAR) 25 MG tablet Take 0.5 tablets (12.5 mg total) by mouth daily.  . metFORMIN (GLUCOPHAGE) 500 MG tablet TAKE 2 TABLETS BY MOUTH TWICE DAILY WITH A MEAL  . metoprolol tartrate (LOPRESSOR) 50 MG tablet Take 1.5 tablets  (75 mg total) by mouth 2 (two) times daily.  . montelukast (SINGULAIR) 10 MG tablet Take 10 mg by mouth at bedtime.  . predniSONE (DELTASONE) 5 MG tablet Take 5 mg by mouth daily with breakfast.  . SYMBICORT 160-4.5 MCG/ACT inhaler Inhale 2 puffs 2 (two) times daily into the lungs.   Philip [name] acetaminophen (TYLENOL) 500 MG tablet Take 500 mg by mouth every 6 (six) hours as needed.  . Artificial Tear Ointment (DRY EYES OP) Place 1 drop into both eyes daily as needed (for dry eyes).  . Blood Glucose Monitoring Suppl (ONE TOUCH ULTRA 2) w/Device KIT Use to check blood sugar as advised up to twice a day  . cyclobenzaprine (FLEXERIL) 10 MG tablet Take 10 mg by mouth 3 (three) times daily.  . diclofenac sodium (VOLTAREN) 1 % GEL Apply 2 g topically 3 (three) times daily as needed (Use on elbow bursitis and knee).  Philip [name] ELIQUIS 5 MG TABS tablet Take 1 tablet by mouth twice daily  . Ensure Max Protein (ENSURE MAX PROTEIN) LIQD Take 330 mLs (11 oz total) by mouth 2 (two) times daily.  . furosemide (LASIX) 40 MG tablet Take 20-40 mg by mouth daily as needed for edema. Only as needed now.  Philip [name] HYDROcodone-acetaminophen (NORCO/VICODIN) 5-325 MG tablet Take 1 tablet by mouth every 4 (four) hours as needed for moderate pain or severe pain.  Philip [name] LANCETS ULTRA THIN MISC 1 Device by Does not apply route 4 (four) times daily -  before meals and at bedtime.  Philip [name] levothyroxine (SYNTHROID)  88 MCG tablet Take 1 tablet (88 mcg total) by mouth daily before breakfast.  . ONETOUCH ULTRA test strip USE TEST STRIP(S) TO CHECK GLUCOSE TWICE DAILY  . trimethoprim-polymyxin b (POLYTRIM) ophthalmic solution Place 1 drop into both eyes every 4 (four) hours. For up to 7 days or until resolved. (Patient not taking: Reported on 02/04/2020)   No facility-administered encounter medications on file as of 02/04/2020.    Goals Addressed            This Visit's Progress   . PharmD - Medication Management       CARE PLAN ENTRY (see longitudinal  plan of care for additional care plan information)  Current Barriers:  Philip [name] Knowledge deficits related to dietary choices to improve blood sugar control . Financial - currently in coverage gap of Medicare Part D plan  Pharmacist Clinical Goal(s):  Philip [name] Over the next 30 days, patient will work with CM Pharmacist to address needs related to medication regimen optimization.  Interventions: . Perform chart review . Follow up with patient regarding back and hip pain o Patient reports received another steroid injection in lower back on 9/24 o Next appointment with Physical Medicine and Rehabilitation on 10/15 . Reports believes has another genital yeast infection. Reports has kept area clean and dry. Reports having itching and white residue in genital area o Note patient previously seen by PCP on 7/21 for genital yeast infection and treated with course of fluconazole  o Note patient taking [place]. SGLT2 inhibitors associated with increase risk of genital yeast infection o Advise patient to follow up with PCP regarding concern for yeast infection . Follow up regarding blood sugar control and monitoring o Reports currently taking  metformin 500 mg - 2 tablets (1,000 mg) twice daily as directed  Farxiga 5 mg once daily  Note patient continues on prednisone 5 mg once daily as directed by pulmonologist o Review recent blood sugar results:  Fasting Blood Glucose After Supper  (~2 hours) Notes  30 - September - -   1 - October 184 210   2 - October 131 223   3 - October 143 251   4 - October 147 190   5 - October 136 354* *Pasta, ice cream and cobbler  6 - October 153    Average 149 246     Patient attributes some elevations to dietary indiscretions  Again discuss importance of limiting carbohydrate portion sizes and well-balanced meals . Discuss blood pressure/HR monitoring and control o Reports taking:  Losartan 25 mg - 1/2 tablet (12.5 mg) once daily  Metoprolol 75 mg twice  daily  Furosemide ONLY as needed as directed by Cardiologist - denies needing recently o Review recent BP/HR results   AM BP PM BP  30 - September 101/88, HR 113 140/88, HR 78  1 - October - -  2 - October 130/83, HR 72 134/89, HR 88  3 - October 147/99, HR 88 104/75, HR 88  4 - October 115/82, HR 72 128/84, HR 95  5 - October 137/93, HR 92 108/75, HR 95  6 - October 128/82, HR 69    o Reports often catches himself continuing to move around while taking readings  o Counsel on BP monitoring technique, including importance of resting 5 minutes prior to reading and sitting still during reading o Have counseled patient on importance of continuing to weigh himself daily and follow directions from Cardiologist for use of PRN furosemide (Lasix).  o Have counseled patient  on importance of contacting Cardiologist for BP and HR readings outside of established parameters . Discuss importance of medication adherence o Note patient uses weekly pillbox to organize medications o Reports not often, but sometimes misses an evening dose. Counsel on strategies to aid with adherence, including use of daily alarm o Reports using maintenance (Symbicort) inhaler, rescue inhaler/nebulizer solution, and other respiratory medications as directed. Confirms rinsing out mouth after use of Symbicort.  Encourage patient to follow up with pulmonologist at upcoming appointment regarding prednisone . Again counsel on COVID-19 vaccinations and continued importance of COVID-19 prevention ? Stated that he is not currently ready to receive the vaccine due to concerns about side effects. ? Stated would consider receiving in the future . Will collaborate with PCP regarding diabetes medication management  Patient Self Care Activities:  . Self administers medications as prescribed . Attends all scheduled provider appointments o Next appointment with Physical Medicine and Rehabilitation on 10/15 o Next appointment with  Pulmonology on 10/18 . Calls pharmacy for medication refills . Calls provider office for new concerns or questions  . Patient to check blood sugar regularly as directed and keep log . Patient to keep log when checks blood pressure   Please see past updates related to this goal by clicking on the "Past Updates" button in the selected goal         Plan  Telephone follow up appointment with care management team member scheduled for: 03/17/20 at 9 am  [name], PharmD, [place] [phone]

## 2020-02-13 DIAGNOSIS — M5417 Radiculopathy, lumbosacral region: Secondary | ICD-10-CM | POA: Diagnosis not present

## 2020-02-13 DIAGNOSIS — M5416 Radiculopathy, lumbar region: Secondary | ICD-10-CM | POA: Diagnosis not present

## 2020-02-13 DIAGNOSIS — M5136 Other intervertebral disc degeneration, lumbar region: Secondary | ICD-10-CM | POA: Diagnosis not present

## 2020-02-13 DIAGNOSIS — M5126 Other intervertebral disc displacement, lumbar region: Secondary | ICD-10-CM | POA: Diagnosis not present

## 2020-02-16 DIAGNOSIS — G4733 Obstructive sleep apnea (adult) (pediatric): Secondary | ICD-10-CM | POA: Diagnosis not present

## 2020-02-16 DIAGNOSIS — R0600 Dyspnea, unspecified: Secondary | ICD-10-CM | POA: Diagnosis not present

## 2020-02-16 DIAGNOSIS — J310 Chronic rhinitis: Secondary | ICD-10-CM | POA: Diagnosis not present

## 2020-02-16 DIAGNOSIS — J449 Chronic obstructive pulmonary disease, unspecified: Secondary | ICD-10-CM | POA: Diagnosis not present

## 2020-02-18 ENCOUNTER — Other Ambulatory Visit: Payer: Self-pay | Admitting: Family Medicine

## 2020-02-18 DIAGNOSIS — E119 Type 2 diabetes mellitus without complications: Secondary | ICD-10-CM

## 2020-02-20 ENCOUNTER — Other Ambulatory Visit: Payer: Self-pay | Admitting: Cardiovascular Disease

## 2020-02-23 NOTE — Telephone Encounter (Signed)
Pt's age 74, wt 89.1 kg, SCr 1.07, CrCl 78.64, last ov w/ MA 12/02/19.

## 2020-02-23 NOTE — Telephone Encounter (Signed)
Refill request

## 2020-03-11 ENCOUNTER — Other Ambulatory Visit: Payer: Self-pay | Admitting: Family Medicine

## 2020-03-11 DIAGNOSIS — E1142 Type 2 diabetes mellitus with diabetic polyneuropathy: Secondary | ICD-10-CM

## 2020-03-17 ENCOUNTER — Telehealth: Payer: Self-pay

## 2020-03-17 ENCOUNTER — Telehealth: Payer: Self-pay | Admitting: Pharmacist

## 2020-03-17 NOTE — Chronic Care Management (AMB) (Signed)
  Chronic Care Management   Outreach Note  03/17/2020 Name: Philip NAGY Sr. MRN: [id number] DOB: 05/30/1945  Referred by: [name], DO Reason for referral : No chief complaint on file.  Outreach to [place] by phone today. Patient reports that he is currently out of town and requests to reschedule this appointment.  Follow Up Plan:Telephone follow up appointment with CM Pharmacist scheduled for: 12/1 at 1:30 pm  [name], PharmD, [place] [facility] [phone]

## 2020-03-29 ENCOUNTER — Other Ambulatory Visit: Payer: Self-pay | Admitting: Family Medicine

## 2020-03-29 DIAGNOSIS — E034 Atrophy of thyroid (acquired): Secondary | ICD-10-CM

## 2020-03-31 ENCOUNTER — Ambulatory Visit: Payer: Medicare Other | Admitting: Pharmacist

## 2020-03-31 DIAGNOSIS — E1169 Type 2 diabetes mellitus with other specified complication: Secondary | ICD-10-CM

## 2020-03-31 DIAGNOSIS — I129 Hypertensive chronic kidney disease with stage 1 through stage 4 chronic kidney disease, or unspecified chronic kidney disease: Secondary | ICD-10-CM

## 2020-03-31 DIAGNOSIS — E785 Hyperlipidemia, unspecified: Secondary | ICD-10-CM

## 2020-03-31 DIAGNOSIS — J432 Centrilobular emphysema: Secondary | ICD-10-CM

## 2020-03-31 NOTE — Chronic Care Management (AMB) (Signed)
Chronic Care Management   Follow Up Note   03/31/2020 Name: Philip VELDHUIZEN Sr. MRN: [id number] DOB: [birth year]  Referred by: Philip Hauser, DO Reason for referral : Chronic Care Management (Patient Phone Call)   Philip Potters Sr. is a 74 y.o. year old male who is a primary care patient of Philip Hauser, DO. The CCM team was consulted for assistance with chronic disease management and care coordination needs.    I reached out to Philip Potters Sr. by phone today.   Review of patient status, including review of consultants reports, relevant laboratory and other test results, and collaboration with appropriate care team members and the patient's provider was performed as part of comprehensive patient evaluation and provision of chronic care management services.    SDOH (Social Determinants of Health) assessments performed: No See Care Plan activities for detailed interventions related to [facility])     Outpatient Encounter Medications as of 03/31/2020  Medication Sig  . dapagliflozin propanediol (FARXIGA) 5 MG TABS tablet Take 5 mg by mouth daily before breakfast.  . fluticasone (FLONASE) 50 MCG/ACT nasal spray Place 1 spray into both nostrils daily as needed for allergies or rhinitis.  . furosemide (LASIX) 40 MG tablet Take 20-40 mg by mouth daily as needed for edema. Only as needed now.  . losartan (COZAAR) 25 MG tablet Take 0.5 tablets (12.5 mg total) by mouth daily.  . metFORMIN (GLUCOPHAGE) 500 MG tablet TAKE 2 TABLETS BY MOUTH TWICE DAILY WITH A MEAL  . metoprolol tartrate (LOPRESSOR) 50 MG tablet Take 1.5 tablets (75 mg total) by mouth 2 (two) times daily.  . montelukast (SINGULAIR) 10 MG tablet Take 10 mg by mouth at bedtime.  . predniSONE (DELTASONE) 5 MG tablet Take 5 mg by mouth daily with breakfast.  . SYMBICORT 160-4.5 MCG/ACT inhaler Inhale 2 puffs 2 (two) times daily into the lungs.   [name] acetaminophen (TYLENOL) 500 MG tablet Take 500 mg by mouth every 6  (six) hours as needed.  . Albuterol Sulfate 108 (90 Base) MCG/ACT AEPB Inhale 2 puffs into the lungs every 6 (six) hours as needed (shortness of breath).   . Artificial Tear Ointment (DRY EYES OP) Place 1 drop into both eyes daily as needed (for dry eyes).  [name] atorvastatin (LIPITOR) 40 MG tablet Take 1 tablet by mouth once daily  . Blood Glucose Monitoring Suppl (ONE TOUCH ULTRA 2) w/Device KIT Use to check blood sugar as advised up to twice a day  . cetirizine (ZYRTEC) 10 MG tablet Take 10 mg by mouth daily.   . cyclobenzaprine (FLEXERIL) 10 MG tablet Take 10 mg by mouth 3 (three) times daily.  . diclofenac sodium (VOLTAREN) 1 % GEL Apply 2 g topically 3 (three) times daily as needed (Use on elbow bursitis and knee).  [name] ELIQUIS 5 MG TABS tablet Take 1 tablet by mouth twice daily  . Ensure Max Protein (ENSURE MAX PROTEIN) LIQD Take 330 mLs (11 oz total) by mouth 2 (two) times daily.  . EUTHYROX 88 MCG tablet TAKE 1 TABLET BY MOUTH ONCE DAILY BEFORE BREAKFAST  . gabapentin (NEURONTIN) 100 MG capsule TAKE 1 CAPSULE BY MOUTH THREE TIMES DAILY  . HYDROcodone-acetaminophen (NORCO/VICODIN) 5-325 MG tablet Take 1 tablet by mouth every 4 (four) hours as needed for moderate pain or severe pain.  [name] ipratropium-albuterol (DUONEB) 0.5-2.5 (3) MG/3ML SOLN Take 3 mLs by nebulization 4 (four) times daily.  [name] LANCETS ULTRA THIN MISC 1 Device by Does not apply route  4 (four) times daily -  before meals and at bedtime.  Glory Rosebush ULTRA test strip USE TEST STRIP(S) TO CHECK GLUCOSE TWICE DAILY  . [DISCONTINUED] fluconazole (DIFLUCAN) 150 MG tablet Take one tablet by mouth on Day 1. Repeat dose 2nd tablet on Day 3.  . [DISCONTINUED] trimethoprim-polymyxin b (POLYTRIM) ophthalmic solution Place 1 drop into both eyes every 4 (four) hours. For up to 7 days or until resolved. (Patient not taking: Reported on 02/04/2020)   No facility-administered encounter medications on file as of 03/31/2020.    Goals Addressed              This Visit's Progress   . PharmD - Medication Management       CARE PLAN ENTRY (see longitudinal plan of care for additional care plan information)  Current Barriers:  [name] Knowledge deficits related to dietary choices to improve blood sugar control . Financial - difficulty with affording Farxiga through Medicare Part D health plan  Pharmacist Clinical Goal(s):  [name] Over the next 30 days, patient will work with CM Pharmacist to address needs related to medication regimen optimization.  Interventions: . Perform chart review. Note patient seen by Pulmonologist on 10/18 . Follow up regarding blood sugar control and monitoring o Reports currently taking  metformin 500 mg - 2 tablets (1,000 mg) twice daily as directed  Farxiga 5 mg once daily  Note patient continues on prednisone 5 mg once daily as directed by pulmonologist o Review recent blood sugar results:  Fasting Blood Glucose After Supper  (~2 hours) Notes  25 - November 157 210   26 - November 121 278   27 - November 130 253   28 - November 131 314   29 - November 137 268   30 - November 197 271   1 - December 157    Average 147 266      Patient attributes some elevations to dietary indiscretions  Again discuss importance of limiting carbohydrate portion sizes and well-balanced meals  Reports interested in making dietary changes, such as:  Cutting out soda  Eating less pasta and bread  Declines written dietary information today  Reports mobility 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 Follow up regarding genital yeast infection symptoms  o Reports completed course of fluconazole.as prescribed by PCP on 10/6 and symptoms resolved and have not returned  Note patient taking [place]. SGLT2 inhibitors associated with increase risk of genital yeast infection  Review personal hygiene education for prevention . Discuss blood pressure/HR monitoring and  control o Reports taking:  Losartan 25 mg - 1/2 tablet (12.5 mg) once daily  Metoprolol 75 mg twice daily  Furosemide only as needed as directed by Cardiologist o Review recent BP/HR results   AM BP PM BP  25 - November 130/88, HR 68 136/88, HR 78  26 - November 146/75, HR 70 138/90, HR 68  27 - November 137/99, HR 76 133/83, HR 85  28 - November 135/95, HR 68 134/96, HR 84  29 - November 142/94, HR 74 136/88, HR 82  30 - November 142/94, HR 85 125/77, HR 81  1 - December 133/91, HR 78    o Counsel on BP monitoring technique, including importance of resting 5 minutes prior to reading and sitting still during reading o Counsel patient on importance of continuing to weigh himself daily and follow directions from Cardiologist for use of PRN furosemide (  Lasix).  o Counsel patient on importance of contacting Cardiologist for BP and HR readings outside of established parameters . Discuss importance of medication adherence o Note patient uses weekly pillbox to organize medications o Reports using maintenance (Symbicort) inhaler, rescue inhaler/nebulizer solution, and other respiratory medications as directed. Confirms rinsing out mouth after use of Symbicort. . Follow up with patient regarding sleep apnea/CPAP ? Reports has resumed using CPAP since Pulmonologist assisted with resolving moisture issue with machine, but denies using every night ? Counsel patient on rational for/encourage patient to use CPAP nightly . Medication assistance: aid patient with completing 2022 calendar year re-enrollment application online ? Read all sections of application to patient and enter patient's responses as directed and submit form with patient's verbal permission. . Will collaborate with Clarence Simcox for her assistance with aiding patient with follow up for the 2022 re-enrollment for [place] patient assistance through [employer] . Remind patient that he is due for follow up DM/A1C visit with PCP and  encourage him to call office today to schedule appointment  Patient Self Care Activities:  . Self administers medications as prescribed . Attends all scheduled provider appointments . Calls pharmacy for medication refills . Calls provider office for new concerns or questions  . Patient to check blood sugar regularly as directed and keep log . Patient to keep log when checks blood pressure   Please see past updates related to this goal by clicking on the "Past Updates" button in the selected goal         Plan  Telephone follow up appointment with care management team member scheduled for: 05/05/2020 at 12:30 pm  [name], PharmD, [place] [phone]

## 2020-03-31 NOTE — Patient Instructions (Signed)
Thank you allowing the Chronic Care Management Team to be a part of your care! It was a pleasure speaking with you today!     CCM (Chronic Care Management) Team    Noreene Larsson RN, MSN, CCM Nurse Care Coordinator  [phone]   [name] PharmD  Clinical Pharmacist  [phone]   Eula Fried LCSW Clinical Social Worker [phone]  Visit Information  Goals Addressed            This Visit's Progress   . PharmD - Medication Management       CARE PLAN ENTRY (see longitudinal plan of care for additional care plan information)  Current Barriers:  [name] Knowledge deficits related to dietary choices to improve blood sugar control . Financial - difficulty with affording Farxiga through Medicare Part D health plan  Pharmacist Clinical Goal(s):  [name] Over the next 30 days, patient will work with CM Pharmacist to address needs related to medication regimen optimization.  Interventions: . Perform chart review. Note patient seen by Pulmonologist on 10/18 . Follow up regarding blood sugar control and monitoring o Reports currently taking  metformin 500 mg - 2 tablets (1,000 mg) twice daily as directed  Farxiga 5 mg once daily  Note patient continues on prednisone 5 mg once daily as directed by pulmonologist o Review recent blood sugar results:  Fasting Blood Glucose After Supper  (~2 hours) Notes  25 - November 157 210   26 - November 121 278   27 - November 130 253   28 - November 131 314   29 - November 137 268   30 - November 197 271   1 - December 157    Average 147 266      Patient attributes some elevations to dietary indiscretions  Again discuss importance of limiting carbohydrate portion sizes and well-balanced meals  Reports interested in making dietary changes, such as:  Cutting out soda  Eating less pasta and bread  Declines written dietary information today  Reports 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 Follow up regarding genital yeast infection symptoms  o Reports completed course of fluconazole.as prescribed by PCP on 10/6 and symptoms resolved and have not returned  Note patient taking [place]. SGLT2 inhibitors associated with increase risk of genital yeast infection  Review personal hygiene education for prevention . Discuss blood pressure/HR monitoring and control o Reports taking:  Losartan 25 mg - 1/2 tablet (12.5 mg) once daily  Metoprolol 75 mg twice daily  Furosemide only as needed as directed by Cardiologist o Review recent BP/HR results   AM BP PM BP  25 - November 130/88, HR 68 136/88, HR 78  26 - November 146/75, HR 70 138/90, HR 68  27 - November 137/99, HR 76 133/83, HR 85  28 - November 135/95, HR 68 134/96, HR 84  29 - November 142/94, HR 74 136/88, HR 82  30 - November 142/94, HR 85 125/77, HR 81  1 - December 133/91, HR 78    o Counsel on BP monitoring technique, including importance of resting 5 minutes prior to reading and sitting still during reading o Counsel patient on importance of continuing to weigh himself daily and follow directions from Cardiologist for use of PRN furosemide (Lasix).  o Counsel patient on importance of contacting Cardiologist for BP and HR readings outside of established parameters . Discuss importance of medication adherence o  Note patient uses weekly pillbox to organize medications o Reports using maintenance (Symbicort) inhaler, rescue inhaler/nebulizer solution, and other respiratory medications as directed. Confirms rinsing out mouth after use of Symbicort. . Follow up with patient regarding sleep apnea/CPAP ? Reports has resumed using CPAP since Pulmonologist assisted with resolving moisture issue with machine, but denies using every night ? Counsel patient on rational for/encourage patient to use CPAP nightly . Medication assistance: aid patient with completing 2022 calendar year  re-enrollment application online ? Read all sections of application to patient and enter patient's responses as directed and submit form with patient's verbal permission. . Will collaborate with Shalimar Simcox for her assistance with aiding patient with follow up for the 2022 re-enrollment for [place] patient assistance through [employer] . Remind patient that he is due for follow up DM/A1C visit with PCP and encourage him to call office today to schedule appointment  Patient Self Care Activities:  . Self administers medications as prescribed . Attends all scheduled provider appointments . Calls pharmacy for medication refills . Calls provider office for new concerns or questions  . Patient to check blood sugar regularly as directed and keep log . Patient to keep log when checks blood pressure   Please see past updates related to this goal by clicking on the "Past Updates" button in the selected goal         The patient verbalized understanding of instructions, educational materials, and care plan provided today and declined offer to receive copy of patient instructions, educational materials, and care plan.   Telephone follow up appointment with care management team member scheduled for: 05/05/2020 at 12:30 pm  [name], PharmD, [place] [phone]

## 2020-04-06 ENCOUNTER — Other Ambulatory Visit: Payer: Self-pay | Admitting: Family Medicine

## 2020-04-06 DIAGNOSIS — E1142 Type 2 diabetes mellitus with diabetic polyneuropathy: Secondary | ICD-10-CM

## 2020-04-06 NOTE — Telephone Encounter (Signed)
Requested Prescriptions  Pending Prescriptions Disp Refills  . ONETOUCH ULTRA test strip [employer] Med Name: OneTouch Ultra Blue In Vitro Strip] 150 each 2    Sig: USE TEST STRIPS TO CHECK GLUCOSE TWICE DAILY.     Endocrinology: Diabetes - Testing Supplies Passed - 04/06/2020 11:35 AM      Passed - Valid encounter within last 12 months    Recent Outpatient Visits          3 months ago Acute conjunctivitis of both eyes, unspecified acute conjunctivitis type   [place], DO   4 months ago Chronic right hip pain   [place], DO   7 months ago Acute idiopathic gout of right foot   [facility] [name], DO   9 months ago COPD with acute exacerbation [facility])   [place], DO   1 year ago Type 2 diabetes mellitus with peripheral neuropathy [facility])   [facility], [name], DO

## 2020-05-05 ENCOUNTER — Ambulatory Visit: Payer: Self-pay | Admitting: Pharmacist

## 2020-05-05 DIAGNOSIS — I129 Hypertensive chronic kidney disease with stage 1 through stage 4 chronic kidney disease, or unspecified chronic kidney disease: Secondary | ICD-10-CM

## 2020-05-05 DIAGNOSIS — J432 Centrilobular emphysema: Secondary | ICD-10-CM

## 2020-05-05 DIAGNOSIS — E1169 Type 2 diabetes mellitus with other specified complication: Secondary | ICD-10-CM

## 2020-05-05 DIAGNOSIS — E785 Hyperlipidemia, unspecified: Secondary | ICD-10-CM

## 2020-05-05 NOTE — Chronic Care Management (AMB) (Signed)
Chronic Care Management   Follow Up Note   05/05/2020 Name: Philip LIFORD Sr. MRN: [id number] DOB: 03-Jul-1945  Referred by: [name], DO Reason for referral : Chronic Care Management (Patient Phone Call)   Philip Potters Sr. is a 75 y.o. year old male who is a primary care patient of [name], DO. The CCM team was consulted for assistance with chronic disease management and care coordination needs.    I reached out to Philip Potters Sr. by phone today.   Review of patient status, including review of consultants reports, relevant laboratory and other test results, and collaboration with appropriate care team members and the patient's provider was performed as part of comprehensive patient evaluation and provision of chronic care management services.    SDOH (Social Determinants of Health) assessments performed: No See Care Plan activities for detailed interventions related to SDOH)    Objective  Lab Results  Component Value Date   CREATININE 1.07 11/21/2019   BUN 26 (H) 11/21/2019   NA 139 11/21/2019   K 4.4 11/21/2019   CL 102 11/21/2019   CO2 28 11/21/2019    Lab Results  Component Value Date   HGBA1C 7.9 (H) 06/27/2019    Lab Results  Component Value Date   CHOL 145 11/21/2019   HDL 67 11/21/2019   LDLCALC 58 11/21/2019   TRIG 99 11/21/2019   CHOLHDL 2.2 11/21/2019     Outpatient Encounter Medications as of 05/05/2020  Medication Sig  . dapagliflozin propanediol (FARXIGA) 5 MG TABS tablet Take 5 mg by mouth daily before breakfast.  . metFORMIN (GLUCOPHAGE) 500 MG tablet TAKE 2 TABLETS BY MOUTH TWICE DAILY WITH A MEAL  . predniSONE (DELTASONE) 5 MG tablet Take 5 mg by mouth daily with breakfast.  . SYMBICORT 160-4.5 MCG/ACT inhaler Inhale 2 puffs 2 (two) times daily into the lungs.   [name] acetaminophen (TYLENOL) 500 MG tablet Take 500 mg by mouth every 6 (six) hours as needed.  . Albuterol Sulfate 108 (90 Base) MCG/ACT AEPB Inhale 2  puffs into the lungs every 6 (six) hours as needed (shortness of breath).   . Artificial Tear Ointment (DRY EYES OP) Place 1 drop into both eyes daily as needed (for dry eyes).  [name] atorvastatin (LIPITOR) 40 MG tablet Take 1 tablet by mouth once daily  . Blood Glucose Monitoring Suppl (ONE TOUCH ULTRA 2) w/Device KIT Use to check blood sugar as advised up to twice a day  . cetirizine (ZYRTEC) 10 MG tablet Take 10 mg by mouth daily.   . cyclobenzaprine (FLEXERIL) 10 MG tablet Take 10 mg by mouth 3 (three) times daily.  . diclofenac sodium (VOLTAREN) 1 % GEL Apply 2 g topically 3 (three) times daily as needed (Use on elbow bursitis and knee).  [name] ELIQUIS 5 MG TABS tablet Take 1 tablet by mouth twice daily  . Ensure Max Protein (ENSURE MAX PROTEIN) LIQD Take 330 mLs (11 oz total) by mouth 2 (two) times daily.  . EUTHYROX 88 MCG tablet TAKE 1 TABLET BY MOUTH ONCE DAILY BEFORE BREAKFAST  . fluticasone (FLONASE) 50 MCG/ACT nasal spray Place 1 spray into both nostrils daily as needed for allergies or rhinitis.  . furosemide (LASIX) 40 MG tablet Take 20-40 mg by mouth daily as needed for edema. Only as needed now.  . gabapentin (NEURONTIN) 100 MG capsule TAKE 1 CAPSULE BY MOUTH THREE TIMES DAILY  . HYDROcodone-acetaminophen (NORCO/VICODIN) 5-325 MG tablet Take 1 tablet by mouth every  4 (four) hours as needed for moderate pain or severe pain. (Patient not taking: Reported on 05/05/2020)  . ipratropium-albuterol (DUONEB) 0.5-2.5 (3) MG/3ML SOLN Take 3 mLs by nebulization 4 (four) times daily.  [name] LANCETS ULTRA THIN MISC 1 Device by Does not apply route 4 (four) times daily -  before meals and at bedtime.  [name] losartan (COZAAR) 25 MG tablet Take 0.5 tablets (12.5 mg total) by mouth daily.  . metoprolol tartrate (LOPRESSOR) 50 MG tablet Take 1.5 tablets (75 mg total) by mouth 2 (two) times daily.  . montelukast (SINGULAIR) 10 MG tablet Take 10 mg by mouth at bedtime.  Philip [name] ULTRA test strip USE TEST STRIPS TO  CHECK GLUCOSE TWICE DAILY.   No facility-administered encounter medications on file as of 05/05/2020.    Goals Addressed            This Visit's Progress   . PharmD - Medication Management       CARE PLAN ENTRY (see longitudinal plan of care for additional care plan information)  Current Barriers:  [name] Knowledge deficits related to dietary choices to improve blood sugar control . Financial - difficulty with affording Farxiga through Medicare Part D health plan  Pharmacist Clinical Goal(s):  [name] Over the next 30 days, patient will work with CM Pharmacist to address needs related to medication regimen optimization.  Interventions:  Type 2 Diabetes . Reports currently taking o metformin 500 mg - 2 tablets (1,000 mg) twice daily as directed o Farxiga 5 mg once daily o Note patient continues on prednisone 5 mg once daily as directed by pulmonologist . Review recent blood sugar results:  Fasting Blood Glucose After Supper  (~2 hours)  30 - December 166 248  31 - December 177 187  1 - January 140 267  2 - January 160 290  3 - January 163 201  4 - January 146 240  5 - January 144   Average 156 239   o Patient attributes some elevations to dietary indiscretions o Reports missed one dose of [place] last week . Again discuss importance of limiting carbohydrate portion sizes and well-balanced meals o Reports has been working on cutting back on pasta and drinking less soda o Declines written dietary information today . Reports mobility limited by knee and back pain. Reports is no longer able to go for daily walks with his dog. o Reports planning to follow up with Orthopaedics about back and knee pain . Discuss importance of blood sugar control and diabetes medication management with patient. o Patient denies any further genital yeast infection symptoms  . Will collaborate with PCP regarding diabetes medication management . Patient requests new [facility] Care Management booklet for blood  sugar, blood pressure and daily weight monitoring logs o Will mail to patient as requested  CHF/HTN . Review recent BP/HR results   AM BP PM BP  30 - December 145/87, HR 78 138/78, HR 72  31 - December 141/91, HR 87 133/94, HR 82  1 - January 142/93, HR 87 127/78, HR 84  2 - January 121/89, HR 87 137/93, HR 83  3 - January 131/85, HR 86 122/85, HR 72  4 - January 133/86, HR 69 136/90, HR 80  5 - January 134/98, HR 83    . Again counsel on BP monitoring technique, including importance of resting 5 minutes prior to reading and sitting still during reading . Counsel patient on importance of continuing to weigh himself daily and follow directions from  Cardiologist for use of PRN furosemide (Lasix).   Reports has had increased swelling in feet for a couple of weeks and has been following directions from Cardiologist for use of PRN furosemide (Lasix) based on symptoms and daily weights. Reports symptoms have improved, but some swelling in feet remains  Encourage patient to follow up with Cardiology today regarding swelling in his feet . Have counseled patient on importance of contacting Cardiologist for BP and HR readings outside of established parameters  Medication/Device Adherence . Note patient uses weekly pillbox to organize medications . Reports using maintenance (Symbicort) inhaler, rescue inhaler/nebulizer solution, and other respiratory medications as directed. Confirms rinsing out mouth after use of Symbicort. . Follow up with patient regarding sleep apnea/CPAP ? Reports has resumed using CPAP since Pulmonologist assisted with resolving moisture issue with machine, but denies using every night ? Again counsel patient on rational for/encourage patient to use CPAP nightly  Medication Assistance . Will collaborate with [place] Simcox for her assistance with aiding patient with follow up for the 2022 re-enrollment for [place] patient assistance through [place] . Again remind  patient that he is due for follow up DM/A1C visit with PCP ? Patient states he will call office today to schedule appointment  Patient Self Care Activities:  . Self administers medications as prescribed . Attends all scheduled provider appointments . Calls pharmacy for medication refills . Calls provider office for new concerns or questions  . Patient to check blood sugar regularly as directed and keep log . Patient to keep log when checks blood pressure   Please see past updates related to this goal by clicking on the "Past Updates" button in the selected goal         Plan  Telephone follow up appointment with care management team member scheduled for: 06/07/2020 at 10:15 am  [name], PharmD, [place] [phone]

## 2020-05-05 NOTE — Patient Instructions (Signed)
Thank you allowing the Chronic Care Management Team to be a part of your care! It was a pleasure speaking with you today!     CCM (Chronic Care Management) Team    Alto Denver RN, MSN, CCM Nurse Care Coordinator  [phone]   Duanne Moron PharmD  Clinical Pharmacist  [phone]   Dickie La LCSW Clinical Social Worker [phone]  Visit Information  Goals Addressed            This Visit's Progress   . PharmD - Medication Management       CARE PLAN ENTRY (see longitudinal plan of care for additional care plan information)  Current Barriers:  [name] Knowledge deficits related to dietary choices to improve blood sugar control . Financial - difficulty with affording Farxiga through Medicare Part D health plan  Pharmacist Clinical Goal(s):  [name] Over the next 30 days, patient will work with CM Pharmacist to address needs related to medication regimen optimization.  Interventions:  Type 2 Diabetes . Reports currently taking o metformin 500 mg - 2 tablets (1,000 mg) twice daily as directed o Farxiga 5 mg once daily o Note patient continues on prednisone 5 mg once daily as directed by pulmonologist . Review recent blood sugar results:  Fasting Blood Glucose After Supper  (~2 hours)  30 - December 166 248  31 - December 177 187  1 - January 140 267  2 - January 160 290  3 - January 163 201  4 - January 146 240  5 - January 144   Average 156 239   o Patient attributes some elevations to dietary indiscretions o Reports missed one dose of [place] last week . Again discuss importance of limiting carbohydrate portion sizes and well-balanced meals o Reports has been working on cutting back on pasta and drinking less soda o Declines written dietary information today . Reports mobility limited by knee and back pain. Reports is no longer able to go for daily walks with his dog. o Reports planning to follow up with Orthopaedics about back and knee pain . Discuss  importance of blood sugar control and diabetes medication management with patient. o Patient denies any further genital yeast infection symptoms  . Will collaborate with PCP regarding diabetes medication management . Patient requests new [facility] Care Management booklet for blood sugar, blood pressure and daily weight monitoring logs o Will mail to patient as requested  CHF/HTN . Review recent BP/HR results   AM BP PM BP  30 - December 145/87, HR 78 138/78, HR 72  31 - December 141/91, HR 87 133/94, HR 82  1 - January 142/93, HR 87 127/78, HR 84  2 - January 121/89, HR 87 137/93, HR 83  3 - January 131/85, HR 86 122/85, HR 72  4 - January 133/86, HR 69 136/90, HR 80  5 - January 134/98, HR 83    . Again counsel on BP monitoring technique, including importance of resting 5 minutes prior to reading and sitting still during reading . Counsel patient on importance of continuing to weigh himself daily and follow directions from Cardiologist for use of PRN furosemide (Lasix).   Reports has had increased swelling in feet for a couple of weeks and has been following directions from Cardiologist for use of PRN furosemide (Lasix) based on symptoms and daily weights. Reports symptoms have improved, but some swelling in feet remains  Encourage patient to follow up with Cardiology today regarding swelling in his feet . Have counseled  patient on importance of contacting Cardiologist for BP and HR readings outside of established parameters  Medication/Device Adherence . Note patient uses weekly pillbox to organize medications . Reports using maintenance (Symbicort) inhaler, rescue inhaler/nebulizer solution, and other respiratory medications as directed. Confirms rinsing out mouth after use of Symbicort. . Follow up with patient regarding sleep apnea/CPAP ? Reports has resumed using CPAP since Pulmonologist assisted with resolving moisture issue with machine, but denies using every night ? Again counsel  patient on rational for/encourage patient to use CPAP nightly  Medication Assistance . Will collaborate with [place] Simcox for her assistance with aiding patient with follow up for the 2022 re-enrollment for [place] patient assistance through [place] . Again remind patient that he is due for follow up DM/A1C visit with PCP ? Patient states he will call office today to schedule appointment  Patient Self Care Activities:  . Self administers medications as prescribed . Attends all scheduled provider appointments . Calls pharmacy for medication refills . Calls provider office for new concerns or questions  . Patient to check blood sugar regularly as directed and keep log . Patient to keep log when checks blood pressure   Please see past updates related to this goal by clicking on the "Past Updates" button in the selected goal         The patient verbalized understanding of instructions, educational materials, and care plan provided today and declined offer to receive copy of patient instructions, educational materials, and care plan.   Telephone follow up appointment with care management team member scheduled for: 06/07/2020 at 10:15 am  [name], PharmD, [place] [phone]

## 2020-05-07 ENCOUNTER — Other Ambulatory Visit: Payer: Self-pay

## 2020-05-07 ENCOUNTER — Ambulatory Visit (INDEPENDENT_AMBULATORY_CARE_PROVIDER_SITE_OTHER): Payer: Medicare Other | Admitting: Family Medicine

## 2020-05-07 ENCOUNTER — Encounter: Payer: Self-pay | Admitting: Family Medicine

## 2020-05-07 VITALS — BP 104/68 | HR 83 | Temp 97.3°F | Resp 16 | Ht 71.0 in | Wt 208.6 lb

## 2020-05-07 DIAGNOSIS — M10071 Idiopathic gout, right ankle and foot: Secondary | ICD-10-CM | POA: Diagnosis not present

## 2020-05-07 MED ORDER — PREDNISONE 10 MG PO TABS: ORAL_TABLET | ORAL | 0 refills | Status: DC

## 2020-05-07 NOTE — Progress Notes (Signed)
Subjective:    Patient ID: Philip [name]., male    DOB: 07-25-1945, 75 y.o.   MRN: [id number]  Philip RODGER Sr. is a 75 y.o. male presenting on 05/07/2020 for Foot Pain (Right side--was swollen taken furosemide got improved now )   HPI   Right Foot / Ankle Swelling / Pain Reports acute onset recently within past few days, swelling in R ankle, with pain and some erythema over ankle. He was unable to walk on it at first, now it is gradually improving, today is notable better. He takes chronic prednisone 5mg  daily, history of possible gout flare, but not confirmed, he had last uric acid 6.4 in past. Not on prophylaxis. He has varicose veins and chronic edema, and chronic knee problem worse R side Denies fall injury twist fever chills ulceration   Depression screen [facility] 2/9 03/12/2019 12/31/2018 12/02/2018  Decreased Interest 0 0 0  Down, Depressed, Hopeless 0 0 0  PHQ - 2 Score 0 0 0  Altered sleeping - - -  Tired, decreased energy - - -  Change in appetite - - -  Feeling bad or failure about yourself  - - -  Trouble concentrating - - -  Moving slowly or fidgety/restless - - -  Suicidal thoughts - - -  PHQ-9 Score - - -  Difficult doing work/chores - - -  Some recent data might be hidden    Social History   Tobacco Use  . Smoking status: Former Smoker    Packs/day: 1.00    Years: 8.00    Pack years: 8.00    Types: Cigarettes    Quit date: 12/31/1967    Years since quitting: 52.3  . Smokeless tobacco: Former Network engineer  . Vaping Use: Never used  Substance Use Topics  . Alcohol use: No    Alcohol/week: 0.0 standard drinks    Comment: 05/06/2012 "last alcohol several years ago; never had problem wit"  . Drug use: No    Review of Systems Per HPI unless specifically indicated above     Objective:    BP 104/68   Pulse 83   Temp (!) 97.3 F (36.3 C) (Temporal)   Resp 16   Ht 5\' 11"  (1.803 m)   Wt 208 lb 9.6 oz (94.6 kg)   SpO2 100%   BMI 29.09 kg/m   Wt  Readings from Last 3 Encounters:  05/07/20 208 lb 9.6 oz (94.6 kg)  12/02/19 196 lb 8 oz (89.1 kg)  11/19/19 201 lb 9.6 oz (91.4 kg)    Physical Exam Vitals and nursing note reviewed.  Constitutional:      General: He is not in acute distress.    Appearance: He is well-developed and well-nourished. He is not diaphoretic.     Comments: Well-appearing, comfortable, cooperative  HENT:     Head: Normocephalic and atraumatic.     Mouth/Throat:     Mouth: Oropharynx is clear and moist.  Eyes:     General:        Right eye: No discharge.        Left eye: No discharge.     Conjunctiva/sclera: Conjunctivae normal.  Cardiovascular:     Rate and Rhythm: Normal rate.  Pulmonary:     Effort: Pulmonary effort is normal.  Musculoskeletal:        General: No edema.     Comments: Right ankle inner aspect with localized edema and mild erythema. Varicose veins.  Skin:  General: Skin is warm and dry.     Findings: No erythema or rash.  Neurological:     Mental Status: He is alert and oriented to person, place, and time.  Psychiatric:        Mood and Affect: Mood and affect normal.        Behavior: Behavior normal.     Comments: Well groomed, good eye contact, normal speech and thoughts       Results for orders placed or performed during the hospital encounter of 11/21/19  Lipid panel  Result Value Ref Range   Cholesterol 145 0 - 200 mg/dL   Triglycerides 99 <150 mg/dL   HDL 67 >40 mg/dL   Total CHOL/HDL Ratio 2.2 RATIO   VLDL 20 0 - 40 mg/dL   LDL Cholesterol 58 0 - 99 mg/dL  Comprehensive metabolic panel  Result Value Ref Range   Sodium 139 135 - 145 mmol/L   Potassium 4.4 3.5 - 5.1 mmol/L   Chloride 102 98 - 111 mmol/L   CO2 28 22 - 32 mmol/L   Glucose, Bld 172 (H) 70 - 99 mg/dL   BUN 26 (H) 8 - 23 mg/dL   Creatinine, Ser 1.07 0.61 - 1.24 mg/dL   Calcium 9.2 8.9 - 10.3 mg/dL   Total Protein 6.6 6.5 - 8.1 g/dL   Albumin 4.1 3.5 - 5.0 g/dL   AST 15 15 - 41 U/L   ALT 21 0 -  44 U/L   Alkaline Phosphatase 60 38 - 126 U/L   Total Bilirubin 0.9 0.3 - 1.2 mg/dL   GFR calc non Af Amer >60 >60 mL/min   GFR calc Af Amer >60 >60 mL/min   Anion gap 9 5 - 15      Assessment & Plan:   Problem List Items Addressed This Visit   None   Visit Diagnoses    Acute idiopathic gout of right ankle    -  Primary   Relevant Medications   predniSONE (DELTASONE) 10 MG tablet      Clinically most consistent presentation with acute gout of R ankle No sign cellulitis or ulceration No systemic symptoms No injury or trauma Edema now improved back on lasix  Reassurance, keep on therapy currently RICE, and monitoring Printed a back up plan precaution rx Prednisone 6 day taper 60 to 10mg  -  Hold his existing 5mg  daily if decides to take, only for gout flare possibility.  Meds ordered this encounter  Medications  . predniSONE (DELTASONE) 10 MG tablet    Sig: Take 6 tabs with breakfast Day 1, 5 tabs Day 2, 4 tabs Day 3, 3 tabs Day 4, 2 tabs Day 5, 1 tab Day 6.    Dispense:  21 tablet    Refill:  0      Follow up plan: Return in about 6 weeks (around 06/18/2020), or if symptoms worsen or fail to improve, for 6 week DM A1c follow-up (Foot exam).   Philip [name], [facility] 05/07/2020, 11:45 AM

## 2020-05-07 NOTE — Patient Instructions (Addendum)
Thank you for coming to the office today.  Maybe gout flare. Doesn't look like cellulitis or skin infection. Maybe related to swelling. Keep on current therapy.  Try prednisone printed rx if it does not improve. Stop 5mg  daily and use taper for 6 days then go back to 5mg   Use RICE therapy: - R - Rest / relative rest with activity modification avoid overuse of joint - I - Ice packs (make sure you use a towel or sock / something to protect skin) - C - Compression - E - Elevation - if significant swelling, lift leg above heart level (toes above your nose) to help reduce swelling, most helpful at night after day of being on your feet   Please schedule a Follow-up Appointment to: Return in about 6 weeks (around 06/18/2020), or if symptoms worsen or fail to improve, for 6 week DM A1c follow-up (Foot exam).  If you have any other questions or concerns, please feel free to call the office or send a message through [place]. You may also schedule an earlier appointment if necessary.  Additionally, you may be receiving a survey about your experience at our office within a few days to 1 week by e-mail or mail. We value your feedback.  [name], DO [place]

## 2020-05-12 ENCOUNTER — Other Ambulatory Visit: Payer: Self-pay | Admitting: Family Medicine

## 2020-05-12 DIAGNOSIS — E785 Hyperlipidemia, unspecified: Secondary | ICD-10-CM

## 2020-05-12 DIAGNOSIS — E1169 Type 2 diabetes mellitus with other specified complication: Secondary | ICD-10-CM

## 2020-05-12 MED ORDER — FARXIGA 5 MG PO TABS: 5.0000 mg | ORAL_TABLET | Freq: Every day | ORAL | 3 refills | Status: DC

## 2020-05-19 ENCOUNTER — Other Ambulatory Visit: Payer: Self-pay | Admitting: Family Medicine

## 2020-05-19 DIAGNOSIS — E119 Type 2 diabetes mellitus without complications: Secondary | ICD-10-CM

## 2020-05-31 ENCOUNTER — Ambulatory Visit: Payer: Medicare Other | Admitting: Family

## 2020-05-31 ENCOUNTER — Other Ambulatory Visit: Payer: Self-pay

## 2020-05-31 ENCOUNTER — Encounter: Payer: Self-pay | Admitting: Family

## 2020-05-31 VITALS — BP 112/68 | HR 67 | Ht 71.5 in | Wt 206.0 lb

## 2020-05-31 DIAGNOSIS — Z7901 Long term (current) use of anticoagulants: Secondary | ICD-10-CM

## 2020-05-31 DIAGNOSIS — Z951 Presence of aortocoronary bypass graft: Secondary | ICD-10-CM | POA: Diagnosis not present

## 2020-05-31 DIAGNOSIS — E785 Hyperlipidemia, unspecified: Secondary | ICD-10-CM | POA: Diagnosis not present

## 2020-05-31 DIAGNOSIS — I5020 Unspecified systolic (congestive) heart failure: Secondary | ICD-10-CM

## 2020-05-31 DIAGNOSIS — I2510 Atherosclerotic heart disease of native coronary artery without angina pectoris: Secondary | ICD-10-CM

## 2020-05-31 DIAGNOSIS — I4821 Permanent atrial fibrillation: Secondary | ICD-10-CM | POA: Diagnosis not present

## 2020-05-31 DIAGNOSIS — E1169 Type 2 diabetes mellitus with other specified complication: Secondary | ICD-10-CM

## 2020-05-31 NOTE — Patient Instructions (Addendum)
Medication Instructions:  No medication changes today.   *If you need a refill on your cardiac medications before your next appointment, please call your pharmacy*   Lab Work: Your physician recommends lab work today: CBC, CMP  If you have labs (blood work) drawn today and your tests are completely normal, you will receive your results only by: [name] MyChart Message (if you have MyChart) OR . A paper copy in the mail If you have any lab test that is abnormal or we need to change your treatment, we will call you to review the results.   Testing/Procedures: Your EKG today showed rate controlled atrial fibrillation.    Follow-Up: At [facility], you and your health needs are our priority.  As part of our continuing mission to provide you with exceptional heart care, we have created designated Provider Care Teams.  These Care Teams include your primary Cardiologist (physician) and Advanced Practice Providers (APPs -  Physician Assistants and Nurse Practitioners) who all work together to provide you with the care you need, when you need it.  We recommend signing up for the patient portal called "MyChart".  Sign up information is provided on this After Visit Summary.  MyChart is used to connect with patients for Virtual Visits (Telemedicine).  Patients are able to view lab/test results, encounter notes, upcoming appointments, etc.  Non-urgent messages can be sent to your provider as well.   To learn more about what you can do with MyChart, go to [email or web address].    Your next appointment:   6 month(s)  The format for your next appointment:   In Person  Provider:   You may see [name], MD or one of the following Advanced Practice Providers on your designated Care Team:    Murray Hodgkins, NP  Christell Faith, [name]  Marrianne Mood, [name]  Cadence Kathlen Mody, [place]  Laurann Montana, NP  Other Instructions  Heart Healthy Diet Recommendations: A low-salt diet is recommended.  Meats should be grilled, baked, or boiled. Avoid fried foods. Focus on lean protein sources like fish or chicken with vegetables and fruits. The American Heart Association is a Microbiologist!  American Heart Association Diet and Lifeystyle Recommendations   Exercise recommendations: The American Heart Association recommends 150 minutes of moderate intensity exercise weekly. Try 30 minutes of moderate intensity exercise 4-5 times per week. This could include walking, jogging, or swimming.

## 2020-05-31 NOTE — Progress Notes (Signed)
Office Visit    Patient Name: [name] Date of Encounter: 05/31/2020  Primary Care Provider:  [name], DO Primary Cardiologist:  [name], MD Electrophysiologist:  None   Chief Complaint    Philip Potters Sr. is a 75 y.o. male with a hx of  CAD s/p CABG X3 05/2012 with LIMA to LAD, SVG to LCx, SVG to RPDA, HFrEF, persistent atrial fibrillation on Eliquis, ectopic atrial tachycardia, diastolic dysfunction, DM2, HLD, HTN, CAD presents today for follow up of CAD and atrial fibrillation.   Past Medical History    Past Medical History:  Diagnosis Date  . Arthritis    a. right knee  . Asthma   . Atrial tachycardia ([place])   . 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 etiology.  [name] COPD (chronic obstructive pulmonary disease) ([place])   . Coronary artery disease    a. 05/2012 Cath: severe 3VD-->CABG x 4 by Dr. Servando Snare in 1/2014[place], SVG->LCX & SVG->RPDA; b. 06/2014 MV: no ischemia/infarct.  . Diastolic dysfunction    a. 06/2015 Echo: EF 50-55%, Gr1 DD. Mild MR. Mildly dil LA. Nl RV fxn. Nl PASP.  [name] Edema    LEGS/ FEET  . Essential hypertension   . History of bronchitis   . HOH (hard of hearing)   . Hypercholesteremia   . Neuropathy   . PAF (paroxysmal atrial fibrillation) (HCC)    a. CHA2DS2VASc = 4-->Eliquis.  . Shingles   . Sleep apnea    NO CPAP  . Type II diabetes mellitus ([place])    Past Surgical History:  Procedure Laterality Date  . ANTERIOR CERVICAL DECOMP/DISCECTOMY FUSION  ~ 2009  . BACK SURGERY     CERVICAL FUSION  . CARDIAC CATHETERIZATION  05/06/2012   Significant three-vessel coronary artery disease with normal ejection fraction  . CATARACT EXTRACTION W/PHACO Right 08/15/2017   Procedure: CATARACT EXTRACTION PHACO AND INTRAOCULAR LENS PLACEMENT (IOC);  Surgeon: Birder Robson, MD;  Location: [facility];  Service: Ophthalmology;  Laterality: Right;  [place] 00:23 AP% 13.3 CDE 3.11 Fluid pack lot # [id number]  . CATARACT EXTRACTION  W/PHACO Left 10/03/2017   Procedure: CATARACT EXTRACTION PHACO AND INTRAOCULAR LENS PLACEMENT (IOC);  Surgeon: Birder Robson, MD;  Location: [facility];  Service: Ophthalmology;  Laterality: Left;  Lot #[id number] H [place]: 00:21.2 AP%:13.9 CDE: 2.96  . CORONARY ANGIOPLASTY    . CORONARY ARTERY BYPASS GRAFT  05/07/2012   Procedure: CORONARY ARTERY BYPASS GRAFTING (CABG);  Surgeon: [name], MD;  Location: [place];  Service: Open Heart Surgery;  Laterality: N/A;  times three  . INTRAOPERATIVE TRANSESOPHAGEAL ECHOCARDIOGRAM  05/07/2012   Procedure: INTRAOPERATIVE TRANSESOPHAGEAL ECHOCARDIOGRAM;  Surgeon: [name], MD;  Location: [place];  Service: Open Heart Surgery;  Laterality: N/A;  . KNEE ARTHROSCOPY  ~ 2000   "right" (05/06/2012)  . KNEE SURGERY  2003   right  . NECK SURGERY  2008   Plate in neck  . TEE WITHOUT CARDIOVERSION N/A 07/04/2019   Procedure: TRANSESOPHAGEAL ECHOCARDIOGRAM (TEE);  Surgeon: Nelva Bush, MD;  Location: [facility];  Service: Cardiovascular;  Laterality: N/A;    Allergies  Allergies  Allergen Reactions  . Bydureon [Exenatide] Anaphylaxis    Possible from bydureon, not 100% confirmed  . Clindamycin/Lincomycin Hives and Itching  . Penicillins Hives, Itching, Rash and Other (See Comments)    Has patient had a PCN reaction causing immediate rash, facial/tongue/throat swelling, SOB or lightheadedness with hypotension: Yes Has patient had  a PCN reaction causing severe rash involving mucus membranes or skin necrosis: No Has patient had a PCN reaction that required hospitalization: No Has patient had a PCN reaction occurring within the last 10 years: No If all of the above answers are "NO", then may proceed with Cephalosporin use.  . Sulfa Antibiotics Shortness Of Breath  . Spiriferis Other (See Comments)    Affected breathing  . Spiriva Handihaler [Tiotropium Bromide Monohydrate]     Affected breathing  . Ivp Dye [Iodinated Diagnostic Agents] Rash and Other  (See Comments)    Redness    History of Present Illness    Philip [name]. is a 75 y.o. male with a hx of CAD s/p CABG X3 05/2012 with LIMA to LAD, SVG to LCx, SVG to RPDA, HFrEF, persistent atrial fibrillation on Eliquis, ectopic atrial tachycardia, diastolic dysfunction, DM2, HLD, HTN, CAD.  He was last seen 12/02/19 by Dr. Fletcher Anon.   Cardiac cath 05/2012 severe three-vessel CAD recommended for CABG.  Most recent Myoview 06/2014 evidence of ischemia.  Echo 06/2015 with LVEF 50-50%, GR 1 DD, mild MR, mildly dilated LA, normal RV systolic function, normal PASP.  Atrial fibrillation and on chronic Eliquis.  He was previously on amiodarone but this was discontinued 11/2016 secondary concern for related side effects.  He has not previously been interested in catheter ablation.  Admitted 06/2019 with sepsis secondary to pneumonia and MSSA bacteremia complicated by atrial flutter with RVR.  06/21/2019 LVEF 40 to 25%, global hypokinesis, moderately dilated LV, mildly reduced RV systolic function with mildly large RV cavity size, moderately elevated PASP, mild biatrial management, mild to moderate MR.  TEE 3/\21 with LVEF 30 to 35%, global hypokinesis, mildly reduced RV systolic abdominal aorta RV cavity size, no left atrial appendage thrombus detected, mild to moderate MR, trileaflet AV with mild sclerosis without stenosis, mild plaque involving descending aorta, no evidence of vegetations/infected myocarditis  Seen by cardiology for hospital follow-up 07/09/2019 feeling well.  Remains in rate controlled atrial fibrillation with consideration for DCCV down the road.  Given cardiomyopathy underwent Lexiscan MPI 07/28/2019 with no evidence of ischemia, EF 63%, low risk study.  08/19/18 labs with elevated renal function and high normal potassium. His Lasix was changed to as-needed and Losartan was held for 2 days. Repeat labs 1 week later with normal renal function and potassium.   At last clinic visit 08/04/19 he was  feeling well. His Lasix was decreased to 20mg  every other day as [name] was being added the next day to prevent dehydration or hypotension.   He had echocardiogram 09/12/19 with LVEF 40-45%, global LV hypokinesis, indeterminite LV diastolic parameters, RV normal size and function, LA moderately dilated, moderate MR, mild-moderate TR.   He was last seen 11/2019 and doing well at that time from a cardiac perspective and no changes were made.  He presents today for follow-up with his wife.  Denies chest pain, pressure, tightness.  Reports no shortness of breath at rest.  Tells me his dyspnea on exertion is stable at baseline.  He is on day 3 of a prednisone taper and reports improvement in his dyspnea.  Does notice mild increase in his lower extremity edema which we discussed is likely prednisone.  He does keep his legs elevated when sitting.  He takes Lasix 40 mg as needed for swelling approximately 3 times per week.   Reports home blood pressure and heart rate readings are well controlled.  Denies lightheadedness, dizziness, near syncope.  He is  staying active by walking his dog outdoors.  EKGs/Labs/Other Studies Reviewed:   The following studies were reviewed today:  Echo 09/11/19  1. Left ventricular ejection fraction, by estimation, is 40 to 45%. The  left ventricle has mildly decreased function. The left ventricle  demonstrates global hypokinesis. Left ventricular diastolic parameters are  indeterminate.   2. Right ventricular systolic function is normal. The right ventricular  size is normal. Tricuspid regurgitation signal is inadequate for assessing  PA pressure.   3. Left atrial size was moderately dilated.   4. The mitral valve is normal in structure. Moderate mitral valve  regurgitation. No evidence of mitral stenosis.   5. Tricuspid valve regurgitation is mild to moderate   EKG:  EKG is ordered today.  The ekg ordered today demonstrates rate controlled atrial fib 67 bpm with  occasional PVC. No acute ST/T wave changes.  Recent Labs: 06/30/2019: B Natriuretic Peptide 857.0; Magnesium 2.3; TSH 2.676 07/11/2019: Hemoglobin 13.6; Platelets 306 11/21/2019: ALT 21; BUN 26; Creatinine, Ser 1.07; Potassium 4.4; Sodium 139  Recent Lipid Panel    Component Value Date/Time   CHOL 145 11/21/2019 0915   CHOL 139 12/14/2014 0834   TRIG 99 11/21/2019 0915   HDL 67 11/21/2019 0915   HDL 43 12/14/2014 0834   CHOLHDL 2.2 11/21/2019 0915   VLDL 20 11/21/2019 0915   LDLCALC 58 11/21/2019 0915   LDLCALC 61 06/24/2018 0846    Home Medications   No outpatient medications have been marked as taking for the 05/31/20 encounter (Appointment) with Loel Dubonnet, NP.    Review of Systems    Review of Systems  Constitutional: Negative for chills, fever and malaise/fatigue.  Cardiovascular: Positive for dyspnea on exertion. Negative for chest pain, irregular heartbeat, leg swelling, near-syncope, orthopnea, palpitations and syncope.  Respiratory: Negative for cough, shortness of breath and wheezing.   Gastrointestinal: Negative for melena, nausea and vomiting.  Genitourinary: Negative for hematuria.  Neurological: Negative for dizziness, light-headedness and weakness.   All other systems reviewed and are otherwise negative except as noted above.  Physical Exam    VS:  There were no vitals taken for this visit. , BMI There is no height or weight on file to calculate BMI. GEN: Well nourished, well developed, in no acute distress. HEENT: normal. Neck: Supple, no JVD, carotid bruits, or masses. Cardiac: irregularly irregular, no murmurs, rubs, or gallops. No clubbing, cyanosis, edema.  Radials/DP/PT 2+ and equal bilaterally.  Respiratory:  Respirations regular and unlabored, RLL/RUL/LUL clear to auscultation, LLL with rhonchi GI: Soft, nontender, nondistended, BS + x 4. MS: No deformity or atrophy. Skin: Warm and dry, no rash. Neuro:  Strength and sensation are intact. Psych:  Normal affect.  Assessment & Plan    1. CAD s/p CABG - Stable with no anginal symptoms. EKG no acute ST/T wave changes. No indication for ischemic evaluation at this time. GDMT includes beta blocker and statin. No aspirin secondary to chronic anticoagulation.   2. 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 Euvolemic and well controlled on exam today. Previous TEE 07/04/19 LVEF 30-35%.  Echo 09/11/19 LVEF 40-45%, global hypokinesis, normal LV size, RV normal size and function. NYHA I-II with DOE. GDMT includes Metoprolol (no Coreg secondary to low normal BP), Lasix 40mg  PRN, Dapagliflozin 5mg  QD, Losartan 12.5mg  daily.  Unable to further escalate due to relative hypotension.  Continue low sodium diet. Continue regular walking regimen.   3. Permanent atrial fib -asymptomatic with no palpitations. Rate controlled by EKG today.  As his EF improved from 30-35% on 07/04/19 to 40-45% by echo 08/2019 (which was previous baseline from 06/29/19) appropriate to continue rate control. He is hesitant regarding rhythm control. Continue Metoprolol 75 mg BID.  Anticoagulation, as below.  4. Chronic anticoagulation-secondary PAF and CHA2DS2-VASc of at least 4 (age, HF, HTN, CAD). Denies bleeding complications.  CBC, CMP today for monitoring. Continue Eliquis 5mg  BID.   5. HTN- BP well controlled.  Continue present antihypertensive regimen.  6. HLD- LDL goal <70. Lipid panel 11/20/19 LDL 58  continue Atorvastatin 40mg  daily.   7. COPD- Contributory to [place]. Continue to follow with PCP.   8. DM2- Appreciate inclusion of dapagliflozin for heart failure benefit.   Disposition: Follow up in 6 month(s) with Dr. Fletcher Anon or APP   Loel Dubonnet, NP 05/31/2020, 7:48 AM

## 2020-06-01 ENCOUNTER — Telehealth: Payer: Self-pay | Admitting: *Deleted

## 2020-06-01 LAB — CBC WITH DIFFERENTIAL/PLATELET
Basophils Absolute: 0 10*3/uL (ref 0.0–0.2)
Basos: 0 %
EOS (ABSOLUTE): 0 10*3/uL (ref 0.0–0.4)
Eos: 0 %
Hematocrit: 45.2 % (ref 37.5–51.0)
Hemoglobin: 15.2 g/dL (ref 13.0–17.7)
Immature Grans (Abs): 0 10*3/uL (ref 0.0–0.1)
Immature Granulocytes: 0 %
Lymphocytes Absolute: 1 10*3/uL (ref 0.7–3.1)
Lymphs: 10 %
MCH: 29.4 pg (ref 26.6–33.0)
MCHC: 33.6 g/dL (ref 31.5–35.7)
MCV: 87 fL (ref 79–97)
Monocytes Absolute: 0.7 10*3/uL (ref 0.1–0.9)
Monocytes: 7 %
Neutrophils Absolute: 8.2 10*3/uL — ABNORMAL HIGH (ref 1.4–7.0)
Neutrophils: 83 %
Platelets: 206 10*3/uL (ref 150–450)
RBC: 5.17 x10E6/uL (ref 4.14–5.80)
RDW: 12.9 % (ref 11.6–15.4)
WBC: 9.9 10*3/uL (ref 3.4–10.8)

## 2020-06-01 LAB — COMPREHENSIVE METABOLIC PANEL
ALT: 14 IU/L (ref 0–44)
AST: 11 IU/L (ref 0–40)
Albumin/Globulin Ratio: 2.5 — ABNORMAL HIGH (ref 1.2–2.2)
Albumin: 4.2 g/dL (ref 3.7–4.7)
Alkaline Phosphatase: 87 IU/L (ref 44–121)
BUN/Creatinine Ratio: 20 (ref 10–24)
BUN: 24 mg/dL (ref 8–27)
Bilirubin Total: 0.3 mg/dL (ref 0.0–1.2)
CO2: 20 mmol/L (ref 20–29)
Calcium: 9.1 mg/dL (ref 8.6–10.2)
Chloride: 106 mmol/L (ref 96–106)
Creatinine, Ser: 1.23 mg/dL (ref 0.76–1.27)
GFR calc Af Amer: 67 mL/min/{1.73_m2} (ref >59)
GFR calc non Af Amer: 58 mL/min/{1.73_m2} — ABNORMAL LOW (ref >59)
Globulin, Total: 1.7 g/dL (ref 1.5–4.5)
Glucose: 212 mg/dL — ABNORMAL HIGH (ref 65–99)
Potassium: 4.2 mmol/L (ref 3.5–5.2)
Sodium: 141 mmol/L (ref 134–144)
Total Protein: 5.9 g/dL — ABNORMAL LOW (ref 6.0–8.5)

## 2020-06-01 LAB — HEMOGLOBIN A1C: Hemoglobin A1C: 8.7

## 2020-06-01 NOTE — Telephone Encounter (Signed)
-----   Message from Loel Dubonnet, NP sent at 06/01/2020  7:46 AM EST ----- Stable kidney function. Normal electrolytes and liver enzymes. CBC with no evidence of anemia or infection. Good result!

## 2020-06-01 NOTE — Telephone Encounter (Signed)
Attempted to call both pt and pt's wife per DPR ok. No answer. Both numbers without voicemail. Will attempt to call pt at a later time with results.

## 2020-06-02 ENCOUNTER — Other Ambulatory Visit: Payer: Self-pay | Admitting: Family Medicine

## 2020-06-02 DIAGNOSIS — E119 Type 2 diabetes mellitus without complications: Secondary | ICD-10-CM

## 2020-06-03 NOTE — Telephone Encounter (Signed)
The patient has been notified of the result on 06/01/20 and verbalized understanding.  All questions (if any) were answered. Darlyne Russian, RN 06/03/2020 10:23 AM

## 2020-06-04 ENCOUNTER — Other Ambulatory Visit: Payer: Self-pay

## 2020-06-07 ENCOUNTER — Telehealth: Payer: Self-pay

## 2020-06-10 ENCOUNTER — Other Ambulatory Visit: Payer: Self-pay

## 2020-06-10 ENCOUNTER — Ambulatory Visit (INDEPENDENT_AMBULATORY_CARE_PROVIDER_SITE_OTHER): Payer: Medicare Other | Admitting: Family Medicine

## 2020-06-10 ENCOUNTER — Encounter: Payer: Self-pay | Admitting: Family Medicine

## 2020-06-10 DIAGNOSIS — E1169 Type 2 diabetes mellitus with other specified complication: Secondary | ICD-10-CM | POA: Diagnosis not present

## 2020-06-10 DIAGNOSIS — E785 Hyperlipidemia, unspecified: Secondary | ICD-10-CM | POA: Diagnosis not present

## 2020-06-10 NOTE — Patient Instructions (Addendum)
Thank you for coming to the office today.  Increase Farxiga 5mg  - now take 2 pills = 10mg , for next 1 week, call with Grayland Ormond on 06/16/20 - if you are doing well, let her know and we can re order the higher dose 10mg  pills.  Please schedule a Follow-up Appointment to: Return in about 5 months (around 11/07/2020) for 5 month fasting lab only then 1 week later Annual Physical.  If you have any other questions or concerns, please feel free to call the office or send a message through [place] [facility]. You may also schedule an earlier appointment if necessary.  Additionally, you may be receiving a survey about your experience at our office within a few days to 1 week by e-mail or mail. We value your feedback.  [name], DO [place]

## 2020-06-10 NOTE — Progress Notes (Signed)
Subjective:    Patient ID: Philip Husbands., male    DOB: 1946-01-18, 75 y.o.   MRN: [id number]  Philip JURGENS Sr. is a 75 y.o. male presenting on 06/10/2020 for Follow-up (6 week) and Diabetes   HPI   CHRONIC DM, Type 2: Recent A1c 8.7 from [place] Nurse visit. 06/01/20 He had recent prednisone burst and this spiked his sugar up to >400 at times. CBG readings AM 120-130s and PM 200-250s He admits eating out a lot - and has spiked sugars, admits sweets candy bar often, so higher sugar PM. He has not had further yeast balanitis infection. Meds:Metformin 1000mg  twice daily (500mg  tabs), Farxiga 5mg  daily (through PAP assistance) - OFF Januvia, Bydureon, Lantus Reports good compliance. Tolerating well w/o side-effects Currently on ARB, on Statin - Exercise: Walking at work, no other changes - Complicated by peripheral neuropathy and CKD-IIto III - with now improved kidney function on last lab. -NextDM Eye Exam - [facility] Denies hypoglycemia    Depression screen [facility] 2/9 03/12/2019 12/31/2018 12/02/2018  Decreased Interest 0 0 0  Down, Depressed, Hopeless 0 0 0  PHQ - 2 Score 0 0 0  Altered sleeping - - -  Tired, decreased energy - - -  Change in appetite - - -  Feeling bad or failure about yourself  - - -  Trouble concentrating - - -  Moving slowly or fidgety/restless - - -  Suicidal thoughts - - -  PHQ-9 Score - - -  Difficult doing work/chores - - -  Some recent data might be hidden    Social History   Tobacco Use  . Smoking status: Former Smoker    Packs/day: 1.00    Years: 8.00    Pack years: 8.00    Types: Cigarettes    Quit date: 12/31/1967    Years since quitting: 52.4  . Smokeless tobacco: Former Network engineer  . Vaping Use: Never used  Substance Use Topics  . Alcohol use: No    Alcohol/week: 0.0 standard drinks    Comment: 05/06/2012 "last alcohol several years ago; never had problem wit"  . Drug use: No    Review of Systems Per HPI unless  specifically indicated above     Objective:    BP 100/65   Pulse 85   Temp 97.7 F (36.5 C) (Temporal)   Ht 5\' 11"  (1.803 m)   Wt 205 lb 12.8 oz (93.4 kg)   SpO2 99%   BMI 28.70 kg/m   Wt Readings from Last 3 Encounters:  06/10/20 205 lb 12.8 oz (93.4 kg)  05/31/20 206 lb (93.4 kg)  05/07/20 208 lb 9.6 oz (94.6 kg)    Physical Exam Vitals and nursing note reviewed.  Constitutional:      General: He is not in acute distress.    Appearance: He is well-developed and well-nourished. He is not diaphoretic.     Comments: Well-appearing, comfortable, cooperative  HENT:     Head: Normocephalic and atraumatic.     Mouth/Throat:     Mouth: Oropharynx is clear and moist.  Eyes:     General:        Right eye: No discharge.        Left eye: No discharge.     Conjunctiva/sclera: Conjunctivae normal.  Cardiovascular:     Rate and Rhythm: Normal rate.  Pulmonary:     Effort: Pulmonary effort is normal.  Musculoskeletal:  General: No edema.  Skin:    General: Skin is warm and dry.     Findings: No erythema or rash.  Neurological:     Mental Status: He is alert and oriented to person, place, and time.  Psychiatric:        Mood and Affect: Mood and affect normal.        Behavior: Behavior normal.     Comments: Well groomed, good eye contact, normal speech and thoughts      Diabetic Foot Exam - Simple   Simple Foot Form Diabetic Foot exam was performed with the following findings: Yes 06/10/2020 10:14 AM  Visual Inspection No deformities, no ulcerations, no other skin breakdown bilaterally: Yes Sensation Testing Intact to touch and monofilament testing bilaterally: Yes Pulse Check Posterior Tibialis and Dorsalis pulse intact bilaterally: Yes Comments     Results for orders placed or performed in visit on 06/10/20  Hemoglobin A1c  Result Value Ref Range   Hemoglobin A1C 8.7       Assessment & Plan:   Problem List Items Addressed This Visit    Type 2 diabetes  mellitus with hyperlipidemia (HCC)    Elevated A1c 8.7 range - No hypoglycemia - Concer chronic low dose prednisone contributing. Recent prednisone burst can raise sugar as well. Complications - CKD-II, peripheral neuropathy, cataracts Remains OFF GLP1 Bydureon (allergy), and Lantus insulin, Januvia  Plan:  Increase Farxiga from 5mg  up to 10mg  - he just received shipment from PAP program will have him double dose take 2 of the 5mg  tab for 1 week, and has upcoming [place] call with [name] can discuss his CBG results at that time, and if he is tolerating higher dose, we can submit new order in future to change dose from 5mg  up to 10mg  if indicated  Next option would be reconsider re-trial on Januvia.  Continue Metformin 1000mg  BID (500mg  x 2 BID) Encourage improved lifestyle - low carb, low sugar diet, reduce portion size, continue improving regular exercise - emphasized limiting sweets / eating out - he admits these are raising sugar lately Check CBG, bring log to next visit for review Continue ARB, Statin      Relevant Medications   FARXIGA 5 MG TABS tablet       No orders of the defined types were placed in this encounter.    Follow up plan: Return in about 5 months (around 11/07/2020) for 5 month fasting lab only then 1 week later Annual Physical.  [name], DO [facility] 06/10/2020, 10:06 AM

## 2020-06-10 NOTE — Assessment & Plan Note (Addendum)
Elevated A1c 8.7 range - No hypoglycemia - Concer chronic low dose prednisone contributing. Recent prednisone burst can raise sugar as well. Complications - CKD-II, peripheral neuropathy, cataracts Remains OFF GLP1 Bydureon (allergy), and Lantus insulin, Januvia  Plan:  Increase Farxiga from 5mg  up to 10mg  - he just received shipment from PAP program will have him double dose take 2 of the 5mg  tab for 1 week, and has upcoming [place] call with [name] can discuss his CBG results at that time, and if he is tolerating higher dose, we can submit new order in future to change dose from 5mg  up to 10mg  if indicated  Next option would be reconsider re-trial on Januvia.  Continue Metformin 1000mg  BID (500mg  x 2 BID) Encourage improved lifestyle - low carb, low sugar diet, reduce portion size, continue improving regular exercise - emphasized limiting sweets / eating out - he admits these are raising sugar lately Check CBG, bring log to next visit for review Continue ARB, Statin

## 2020-06-16 ENCOUNTER — Ambulatory Visit (INDEPENDENT_AMBULATORY_CARE_PROVIDER_SITE_OTHER): Payer: Medicare Other | Admitting: Pharmacist

## 2020-06-16 ENCOUNTER — Other Ambulatory Visit: Payer: Self-pay | Admitting: Family Medicine

## 2020-06-16 DIAGNOSIS — N182 Chronic kidney disease, stage 2 (mild): Secondary | ICD-10-CM | POA: Diagnosis not present

## 2020-06-16 DIAGNOSIS — E1169 Type 2 diabetes mellitus with other specified complication: Secondary | ICD-10-CM

## 2020-06-16 DIAGNOSIS — I129 Hypertensive chronic kidney disease with stage 1 through stage 4 chronic kidney disease, or unspecified chronic kidney disease: Secondary | ICD-10-CM | POA: Diagnosis not present

## 2020-06-16 DIAGNOSIS — E785 Hyperlipidemia, unspecified: Secondary | ICD-10-CM

## 2020-06-16 DIAGNOSIS — J432 Centrilobular emphysema: Secondary | ICD-10-CM

## 2020-06-16 MED ORDER — FARXIGA 10 MG PO TABS: 10.0000 mg | ORAL_TABLET | Freq: Every day | ORAL | 3 refills | Status: DC

## 2020-06-16 NOTE — Chronic Care Management (AMB) (Signed)
Chronic Care Management Pharmacy Note  06/16/2020 Name:  Philip [name] Sr. MRN:  [id number] DOB:  02-25-1946  Subjective: Philip Potters Sr. is an 75 y.o. year old male who is a primary patient of Philip [name].  The CCM team was consulted for assistance with disease management and care coordination needs.    Engaged with patient by telephone for follow up visit in response to provider referral for pharmacy case management and/or care coordination services.   Consent to Services:  The patient was given information about Chronic Care Management services, agreed to services, and gave verbal consent prior to initiation of services.  Please see initial visit note for detailed documentation.   Objective:  Lab Results  Component Value Date   CREATININE 1.23 05/31/2020   CREATININE 1.07 11/21/2019   CREATININE 1.09 08/26/2019    Lab Results  Component Value Date   HGBA1C 8.7 06/01/2020       Component Value Date/Time   CHOL 145 11/21/2019 0915   CHOL 139 12/14/2014 0834   TRIG 99 11/21/2019 0915   HDL 67 11/21/2019 0915   HDL 43 12/14/2014 0834   CHOLHDL 2.2 11/21/2019 0915   VLDL 20 11/21/2019 0915   LDLCALC 58 11/21/2019 0915   LDLCALC 61 06/24/2018 0846    BP Readings from Last 3 Encounters:  06/10/20 100/65  05/31/20 112/68  05/07/20 104/68    Assessment: Review of patient past medical history, allergies, medications, health status, including review of consultants reports, laboratory and other test data, was performed as part of comprehensive evaluation and provision of chronic care management services.   SDOH:  (Social Determinants of Health) assessments and interventions performed: none   CCM Care Plan  Allergies  Allergen Reactions  . Bydureon [Exenatide] Anaphylaxis    Possible from bydureon, not 100% confirmed  . Clindamycin/Lincomycin Hives and Itching  . Penicillins Hives, Itching, Rash and Other (See Comments)    Has patient had a PCN  reaction causing immediate rash, facial/tongue/throat swelling, SOB or lightheadedness with hypotension: Yes Has patient had a PCN reaction causing severe rash involving mucus membranes or skin necrosis: No Has patient had a PCN reaction that required hospitalization: No Has patient had a PCN reaction occurring within the last 10 years: No If all of the above answers are "NO", then may proceed with Cephalosporin use.  . Sulfa Antibiotics Shortness Of Breath  . Spiriferis Other (See Comments)    Affected breathing  . Spiriva Handihaler [Tiotropium Bromide Monohydrate]     Affected breathing  . Ivp Dye [Iodinated Diagnostic Agents] Rash and Other (See Comments)    Redness    Medications Reviewed Today    Reviewed by Vella Raring, Philip [name] (Pharmacist) on 06/16/20 at 1016  Med List Status: <None>  Medication Order Taking? Sig Documenting Provider Last Dose Status Informant  acetaminophen (TYLENOL) 500 MG tablet [id number]  Take 500 mg by mouth every 6 (six) hours as needed. [provider]  Active   Albuterol Sulfate 108 (90 Base) MCG/ACT AEPB [id number] Yes Inhale 2 puffs into the lungs every 6 (six) hours as needed (shortness of breath).  [provider] Taking Active Pharmacy Records  Artificial Tear Ointment (DRY EYES OP) [id number]  Place 1 drop into both eyes daily as needed (for dry eyes). [provider]  Active Pharmacy Records  atorvastatin (LIPITOR) 40 MG tablet [id number] Yes Take 1 tablet by mouth once daily [name], MD Taking Active   Blood Glucose Monitoring  Suppl (ONE TOUCH ULTRA 2) w/Device KIT [id number]  Use to check blood sugar as advised up to twice a day Philip [name]  Active Pharmacy Records  cetirizine (ZYRTEC) 10 MG tablet [id number]  Take 10 mg by mouth daily.  [provider]  Active Pharmacy Records  cyclobenzaprine (FLEXERIL) 10 MG tablet [id number]  Take 10 mg by mouth 3 (three) times daily. [provider]  Active   diclofenac sodium (VOLTAREN) 1 % GEL [id number]  Apply 2 g topically 3 (three) times daily as needed (Use on elbow bursitis and knee). Philip [name]  Active Pharmacy Records  ELIQUIS 5 MG TABS tablet [id number]  Take 1 tablet by mouth twice daily [name], MD  Active   Ensure Max Protein (ENSURE MAX PROTEIN) LIQD [id number]  Take 330 mLs (11 oz total) by mouth 2 (two) times daily. Loletha Grayer, MD  Active   EUTHYROX 88 MCG tablet [id number]  TAKE 1 TABLET BY MOUTH ONCE DAILY BEFORE BREAKFAST Philip [name]  Active   FARXIGA 5 MG TABS tablet [id number] Yes Take 2 tablets (10 mg total) by mouth daily before breakfast. Philip [name] Taking Active   fluticasone (FLONASE) 50 MCG/ACT nasal spray [id number] Yes Place 1 spray into both nostrils daily as needed for allergies or rhinitis. [provider] Taking Active Pharmacy Records  furosemide (LASIX) 40 MG tablet [id number] Yes Take 20-40 mg by mouth daily as needed for edema. Only as needed now. Philip [name] Taking Active   gabapentin (NEURONTIN) 100 MG capsule [id number]  TAKE 1 CAPSULE BY MOUTH THREE TIMES DAILY [name], [name], [name]  Active   HYDROcodone-acetaminophen (NORCO/VICODIN) 5-325 MG tablet [id number]  Take 1 tablet by mouth every 4 (four) hours as needed for moderate pain or severe pain. Philip [name]  Active   ipratropium-albuterol (DUONEB) 0.5-2.5 (3) MG/3ML SOLN [id number]  Take 3 mLs by nebulization 4 (four) times daily. [provider]  Active Pharmacy Records  Philip [name] [id number]  1 Device by Does not apply route 4 (four) times daily -  before meals and at bedtime. Philip Isaac, MD  Active Pharmacy Records  losartan (COZAAR) 25 MG tablet [id number] Yes Take 0.5 tablets (12.5 mg total) by mouth daily. Philip Dubonnet, NP Taking Active   metFORMIN (GLUCOPHAGE) 500 MG tablet [id number] Yes TAKE 2  TABLETS BY MOUTH TWICE DAILY WITH A MEAL Philip [name] Taking Active   metoprolol tartrate (LOPRESSOR) 50 MG tablet [id number] Yes Take 1.5 tablets (75 mg total) by mouth 2 (two) times daily. Philip Mu, [name] Taking Active   montelukast (SINGULAIR) 10 MG tablet [id number] Yes Take 10 mg by mouth at bedtime. [provider] Taking Active   ONETOUCH ULTRA test strip [id number]  USE TEST STRIPS TO CHECK GLUCOSE TWICE DAILY. Philip [name]  Active   predniSONE (DELTASONE) 5 MG tablet [id number] Yes Take 5 mg by mouth daily with breakfast. [provider] Taking Active   SYMBICORT 160-4.5 MCG/ACT inhaler [id number] Yes Inhale 2 puffs 2 (two) times daily into the lungs.  [provider] Taking Active Pharmacy Records          Patient Active Problem List   Diagnosis Date Noted  . Chronic right hip pain 11/19/2019  . Thrush   . MSSA bacteremia   . Chronic systolic CHF (congestive heart failure) ([place])   . AF (paroxysmal atrial fibrillation) ([place])   .  HFrEF (heart failure with reduced ejection fraction) ([place])   . Community acquired pneumonia of left lower lobe of lung   . COPD with acute exacerbation ([place]) 06/26/2019  . Overweight (BMI 25.0-29.9) 07/01/2018  . Osteoarthritis of multiple joints 05/14/2017  . Chronic pain of left knee 05/14/2017  . OSA (obstructive sleep apnea) 02/28/2017  . CKD (chronic kidney disease), stage II 02/28/2017  . Chronic anticoagulation 02/28/2017  . Onychomycosis of left great toe 08/14/2016  . Allergic rhinitis due to allergen 07/31/2016  . Peripheral neuropathy 08/17/2015  . Type 2 diabetes mellitus with hyperlipidemia ([place]) 08/17/2015  . (HFpEF) heart failure with preserved ejection fraction ([place]) 07/25/2015  . Atrial fibrillation with rapid ventricular response ([place]) 12/10/2014  . Centrilobular emphysema ([place]) 11/09/2014  . Bradycardia 11/09/2014  . 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 Benign  hypertension with CKD (chronic kidney disease), stage II 11/09/2014  . Bursitis of hip 11/09/2014  . Enlarged prostate 11/09/2014  . Coronary artery disease   . 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 Hypothyroid 05/06/2012  . Hyperlipidemia associated with type 2 diabetes mellitus ([place]) 03/31/2012  . Colon polyp 12/21/2003    Conditions to be addressed/monitored: CHF, HTN, HLD, COPD, CKD and DMII  Care Plan : PharmD - Medication Management/Medication Assistance  Updates made by Vella Raring, [place] since 06/16/2020 12:00 AM    Problem: Disease Progression     Long-Range Goal: Disease Progression Prevented or Minimized   Start Date: 06/16/2020  Expected End Date: 09/14/2020  This Visit's Progress: On track  Priority: High  Note:   Current Barriers:  [name] Knowledge deficits related to dietary choices to improve blood sugar control . Financial - difficulty with affording Farxiga through Medicare Part D health plan . Patient APPROVED for patient assistance for Farxiga from [employer] through 04/30/2021  Pharmacist Clinical Goal(s):  [name] Over the next 90 days, patient will achieve control of T2DM as evidenced by A1C < 7%  through collaboration with PharmD and provider.   Interventions: . 1:1 collaboration with Philip [name] regarding development and update of comprehensive plan of care as evidenced by provider attestation and co-signature . Inter-disciplinary care team collaboration (see longitudinal plan of care) . Perform chart review. Patient seen for follow up with Cardiology on 1/31  Medication Assistance . Received coordination of care message from [place] letting me know patient approved to received Farxiga from [employer] patient assistance program through 04/30/2021, confirmed patient received the medication and reviewed refill procedure  Type 2 Diabetes . Receive coordination of care message from PCP. Patient seen by provider on 2/10 and Farxiga dose increased from 5  mg to 10 mg daily o Note latest A1C: Lab Results  Component Value Date   HGBA1C 8.7 06/01/2020   . Reports currently taking o metformin 500 mg - 2 tablets (1,000 mg) twice daily as directed o Farxiga 5 mg - 2 tablets (10 mg) QAM (reports increased on 2/11) o Note patient continues on prednisone 5 mg once daily as directed by pulmonologist . Review recent blood sugar results:  Fasting Blood Glucose After Supper  (~2 hours) Notes  9 - February 137 438   10 - February 154 206   11 - February 172 324 Increased Farxiga dose to 10 mg daily  12 - February 151 267   13 - February 138 177   14 - February 124 248   15 - February 132 224   16 - February 167  o Denies missed doses . Again discuss importance of limiting carbohydrate portion sizes and well-balanced meals o Reports limiting sugary beverages, not yet eliminated soda from diet . Note patient's mobility limited by knee and back pain.  . Patient denies any side effects or further genital yeast infection symptoms with increased dose of Farxiga o Will collaborate with PCP and recommend new Rx for Farxiga 10 mg daily be sent to [facility] (dispensing pharmacy for [employer] patient assistance program)  CHF/HTN . Current treatment: o  . Review recent BP/HR results   AM BP PM BP  9 - February 111/84, HR 83 103/75, HR 89  10 - February 128/88, HR 88 94/72, HR 68  11 - February 122/89, HR 89 107/78, HR 83  12 - February 126/90, HR 85 118/85, HR 91  13 - February 133/84, HR 84 127/89, HR 77  14 - February 119/89, HR 92 110/75, HR 82  15 - February 124/88, HR 82 134/82, HR 69  16 - February 129/82, HR 82  .  Denies any recent symptoms . Counsel patient on importance of continuing to weigh himself daily and follow directions from Cardiologist for use of PRN furosemide (Lasix).  o Patient denies recent swelling in feet and reports using furosemide as directed . Have counseled patient on importance of contacting Cardiologist for BP and HR  readings outside of established parameters  Medication/Device Adherence . Note patient uses weekly pillbox to organize medications . Reports using maintenance (Symbicort) inhaler, rescue inhaler/nebulizer solution, and other respiratory medications as directed. Confirms rinsing out mouth after use of Symbicort. . Follow up with patient regarding sleep apnea/CPAP o Reports recently has not been using CPAP. Reports has been going to bed late and not wanting to wake his wife  Again counsel patient on rational for/encourage patient to use CPAP nightly  Discuss evening routine to aid with use of CPAP  Patient Goals/Self-Care Activities . Over the next 90 days, patient will:  . Self administers medications as prescribed . Attends all scheduled provider appointments . Calls pharmacy for medication refills . Calls provider office for new concerns or questions  . Patient to check blood sugar regularly as directed and keep log . Patient to keep log when checks blood pressure  Follow Up Plan: Telephone follow up appointment with care management team member scheduled for: 3/16 at 9 am      Medication Assistance: [name] obtained through [employer] medication assistance program.  Enrollment ends 04/30/2021  Follow Up:  Patient agrees to Care Plan and Follow-up.  [name], PharmD, [place] [phone]

## 2020-06-16 NOTE — Patient Instructions (Signed)
Visit Information  PATIENT GOALS: Goals Addressed            This Visit's Progress   . Pharmacy Goals       Our goal A1c is less than 7%. This corresponds with fasting sugars less than 130 and 2 hour after meal sugars less than 180. Please check your blood sugar and keep log of results  Please continue to monitor your home weight, blood pressure and heart rate  Our goal bad cholesterol, or LDL, is less than 70 . This is why it is important to continue taking your atorvastatin   Feel free to call me with any questions or concerns. I look forward to our next call!         The patient verbalized understanding of instructions, educational materials, and care plan provided today and declined offer to receive copy of patient instructions, educational materials, and care plan.   Telephone follow up appointment with care management team member scheduled for: 3/16 at 9 am  [name], PharmD, [place] [phone]

## 2020-06-28 DIAGNOSIS — M5126 Other intervertebral disc displacement, lumbar region: Secondary | ICD-10-CM | POA: Diagnosis not present

## 2020-06-28 DIAGNOSIS — M5416 Radiculopathy, lumbar region: Secondary | ICD-10-CM | POA: Diagnosis not present

## 2020-06-28 DIAGNOSIS — M5136 Other intervertebral disc degeneration, lumbar region: Secondary | ICD-10-CM | POA: Diagnosis not present

## 2020-07-03 ENCOUNTER — Other Ambulatory Visit: Payer: Self-pay | Admitting: Family Medicine

## 2020-07-03 ENCOUNTER — Other Ambulatory Visit: Payer: Self-pay | Admitting: Family

## 2020-07-03 DIAGNOSIS — E034 Atrophy of thyroid (acquired): Secondary | ICD-10-CM

## 2020-07-03 NOTE — Telephone Encounter (Signed)
Requested medication (s) are due for refill today: yes  Requested medication (s) are on the active medication list: Listed as Euthyrox  Last refill:  11/29/221 #90  Future visit scheduled: no  Notes to clinic:  overdue lab work   Requested Prescriptions  Pending Prescriptions Disp Refills   levothyroxine (SYNTHROID) 88 MCG tablet [Pharmacy Med Name: Levothyroxine Sodium 88 MCG Oral Tablet] 90 tablet 0    Sig: TAKE 1 TABLET BY MOUTH ONCE DAILY BEFORE BREAKFAST      Endocrinology:  Hypothyroid Agents Failed - 07/03/2020  5:18 PM      Failed - TSH needs to be rechecked within 3 months after an abnormal result. Refill until TSH is due.      Failed - TSH in normal range and within 360 days    TSH  Date Value Ref Range Status  06/30/2019 2.676 0.350 - 4.500 uIU/mL Final    Comment:    Performed by a 3rd Generation assay with a functional sensitivity of <=0.01 uIU/mL. Performed at [facility], [address] [place], [place] [address]   04/17/2019 3.460 0.450 - 4.500 uIU/mL Final          Passed - Valid encounter within last 12 months    Recent Outpatient Visits           3 weeks ago Type 2 diabetes mellitus with hyperlipidemia [facility])   [facility] [place], [name], DO   1 month ago Acute idiopathic gout of right ankle   [place], DO   6 months ago Acute conjunctivitis of both eyes, unspecified acute conjunctivitis type   [place], DO   7 months ago Chronic right hip pain   [place], DO   10 months ago Acute idiopathic gout of right foot   [place], [name], [place]

## 2020-07-12 DIAGNOSIS — M6281 Muscle weakness (generalized): Secondary | ICD-10-CM | POA: Diagnosis not present

## 2020-07-12 DIAGNOSIS — M5137 Other intervertebral disc degeneration, lumbosacral region: Secondary | ICD-10-CM | POA: Diagnosis not present

## 2020-07-14 ENCOUNTER — Telehealth: Payer: Self-pay | Admitting: Pharmacy Technician

## 2020-07-14 ENCOUNTER — Ambulatory Visit (INDEPENDENT_AMBULATORY_CARE_PROVIDER_SITE_OTHER): Payer: Medicare Other | Admitting: Pharmacist

## 2020-07-14 DIAGNOSIS — E785 Hyperlipidemia, unspecified: Secondary | ICD-10-CM | POA: Diagnosis not present

## 2020-07-14 DIAGNOSIS — N182 Chronic kidney disease, stage 2 (mild): Secondary | ICD-10-CM | POA: Diagnosis not present

## 2020-07-14 DIAGNOSIS — I129 Hypertensive chronic kidney disease with stage 1 through stage 4 chronic kidney disease, or unspecified chronic kidney disease: Secondary | ICD-10-CM | POA: Diagnosis not present

## 2020-07-14 DIAGNOSIS — J432 Centrilobular emphysema: Secondary | ICD-10-CM

## 2020-07-14 DIAGNOSIS — Z596 Low income: Secondary | ICD-10-CM

## 2020-07-14 DIAGNOSIS — E1169 Type 2 diabetes mellitus with other specified complication: Secondary | ICD-10-CM

## 2020-07-14 NOTE — Patient Instructions (Signed)
Visit Information  PATIENT GOALS: Goals Addressed            This Visit's Progress   . Pharmacy Goals       Our goal A1c is less than 7%. This corresponds with fasting sugars less than 130 and 2 hour after meal sugars less than 180. Please check your blood sugar and keep log of results  Please continue to monitor your home weight, blood pressure and heart rate  Our goal bad cholesterol, or LDL, is less than 70 . This is why it is important to continue taking your atorvastatin   Feel free to call me with any questions or concerns. I look forward to our next call!  [name], PharmD, [place] [phone]        The patient verbalized understanding of instructions, educational materials, and care plan provided today and declined offer to receive copy of patient instructions, educational materials, and care plan.   Telephone follow up appointment with care management team member scheduled for: 08/02/2020 at 10:15 AM  [name], PharmD, [place] [phone]

## 2020-07-14 NOTE — Chronic Care Management (AMB) (Signed)
Chronic Care Management Pharmacy Note  07/14/2020 Name:  Philip [name]. MRN:  [id number] DOB:  07-May-1945  Subjective: Philip [name]. is an 75 y.o. year old male who is a primary patient of [name], DO.  The CCM team was consulted for assistance with disease management and care coordination needs.    Engaged with patient by telephone for follow up visit in response to provider referral for pharmacy case management and/or care coordination services.   Consent to Services:  The patient was given information about Chronic Care Management services, agreed to services, and gave verbal consent prior to initiation of services.  Please see initial visit note for detailed documentation.   Patient Care Team: [name], DO as PCP - General (Family Medicine) [name], MD as PCP - Cardiology (Cardiology) [name], MD as Consulting Physician (Cardiology) Leanor Kail, MD (Inactive) (Orthopedic Surgery) Erby Pian, MD as Referring Physician (Specialist) [place], Virl Diamond, [facility] as Pharmacist Minor, Dalbert Garnet, RN (Inactive) as Case Manager  Recent consult visits: Office Visit with Physical Medicine and Rehabilitation on 2/28 for follow up of acute on chronic right lateral hip pain  Hospital visits: None in previous 6 months  Objective:  Lab Results  Component Value Date   CREATININE 1.23 05/31/2020   CREATININE 1.07 11/21/2019   CREATININE 1.09 08/26/2019    Lab Results  Component Value Date   HGBA1C 8.7 06/01/2020   Last diabetic Eye exam:  Lab Results  Component Value Date/Time   HMDIABEYEEXA No Retinopathy 07/08/2018 12:00 AM       Component Value Date/Time   CHOL 145 11/21/2019 0915   CHOL 139 12/14/2014 0834   TRIG 99 11/21/2019 0915   HDL 67 11/21/2019 0915   HDL 43 12/14/2014 0834   CHOLHDL 2.2 11/21/2019 0915   VLDL 20 11/21/2019 0915   LDLCALC 58 11/21/2019 0915   LDLCALC 61 06/24/2018 0846     Hepatic Function Latest Ref Rng & Units 05/31/2020 11/21/2019 07/11/2019  Total Protein 6.0 - 8.5 g/dL 5.9(L) 6.6 -  Albumin 3.7 - 4.7 g/dL 4.2 4.1 3.1(A)  AST 0 - 40 IU/L 11 15 19   ALT 0 - 44 IU/L 14 21 8(A)  Alk Phosphatase 44 - 121 IU/L 87 60 -  Total Bilirubin 0.0 - 1.2 mg/dL 0.3 0.9 -  Bilirubin, Direct 0.00 - 0.40 mg/dL - - -    Lab Results  Component Value Date/Time   TSH 2.676 06/30/2019 04:48 AM   TSH 3.460 04/17/2019 09:09 AM   TSH 3.58 06/24/2018 08:46 AM   FREET4 1.4 06/24/2018 08:46 AM   FREET4 1.2 05/07/2017 08:08 AM    CBC Latest Ref Rng & Units 05/31/2020 07/11/2019 07/03/2019  WBC 3.4 - 10.8 x10E3/uL 9.9 16.3 11.1(H)  Hemoglobin 13.0 - 17.7 g/dL 15.2 13.6 12.7(L)  Hematocrit 37.5 - 51.0 % 45.2 41 37.6(L)  Platelets 150 - 450 x10E3/uL 206 306 225    Social History   Tobacco Use  Smoking Status Former Smoker  . Packs/day: 1.00  . Years: 8.00  . Pack years: 8.00  . Types: Cigarettes  . Quit date: 12/31/1967  . Years since quitting: 52.5  Smokeless Tobacco Former User   BP Readings from Last 3 Encounters:  06/10/20 100/65  05/31/20 112/68  05/07/20 104/68   Pulse Readings from Last 3 Encounters:  06/10/20 85  05/31/20 67  05/07/20 83   Wt Readings from Last 3 Encounters:  06/10/20 205  lb 12.8 oz (93.4 kg)  05/31/20 206 lb (93.4 kg)  05/07/20 208 lb 9.6 oz (94.6 kg)    Assessment: Review of patient past medical history, allergies, medications, health status, including review of consultants reports, laboratory and other test data, was performed as part of comprehensive evaluation and provision of chronic care management services.   SDOH:  (Social Determinants of Health) assessments and interventions performed: none   CCM Care Plan  Allergies  Allergen Reactions  . Bydureon [Exenatide] Anaphylaxis    Possible from bydureon, not 100% confirmed  . Clindamycin/Lincomycin Hives and Itching  . Penicillins Hives, Itching, Rash and Other (See  Comments)    Has patient had a PCN reaction causing immediate rash, facial/tongue/throat swelling, SOB or lightheadedness with hypotension: Yes Has patient had a PCN reaction causing severe rash involving mucus membranes or skin necrosis: No Has patient had a PCN reaction that required hospitalization: No Has patient had a PCN reaction occurring within the last 10 years: No If all of the above answers are "NO", then may proceed with Cephalosporin use.  . Sulfa Antibiotics Shortness Of Breath  . Spiriferis Other (See Comments)    Affected breathing  . Spiriva Handihaler [Tiotropium Bromide Monohydrate]     Affected breathing  . Ivp Dye [Iodinated Diagnostic Agents] Rash and Other (See Comments)    Redness    Medications Reviewed Today    Reviewed by Vella Raring, [place] (Pharmacist) on 06/16/20 at 1016  Med List Status: <None>  Medication Order Taking? Sig Documenting Provider Last Dose Status Informant  acetaminophen (TYLENOL) 500 MG tablet [id number]  Take 500 mg by mouth every 6 (six) hours as needed. [provider]  Active   Albuterol Sulfate 108 (90 Base) MCG/ACT AEPB [id number] Yes Inhale 2 puffs into the lungs every 6 (six) hours as needed (shortness of breath).  [provider] Taking Active Pharmacy Records  Artificial Tear Ointment (DRY EYES OP) [id number]  Place 1 drop into both eyes daily as needed (for dry eyes). [provider]  Active Pharmacy Records  atorvastatin (LIPITOR) 40 MG tablet [id number] Yes Take 1 tablet by mouth once daily [name], MD Taking Active   Blood Glucose Monitoring Suppl (ONE TOUCH ULTRA 2) w/Device KIT [id number]  Use to check blood sugar as advised up to twice a day [name], DO  Active Pharmacy Records  cetirizine (ZYRTEC) 10 MG tablet [id number]  Take 10 mg by mouth daily.  [provider]  Active Pharmacy Records  cyclobenzaprine (FLEXERIL) 10 MG tablet [id number]  Take 10 mg by mouth 3  (three) times daily. [provider]  Active   diclofenac sodium (VOLTAREN) 1 % GEL [id number]  Apply 2 g topically 3 (three) times daily as needed (Use on elbow bursitis and knee). [name], DO  Active Pharmacy Records  ELIQUIS 5 MG TABS tablet [id number]  Take 1 tablet by mouth twice daily [name], MD  Active   Ensure Max Protein (ENSURE MAX PROTEIN) LIQD [id number]  Take 330 mLs (11 oz total) by mouth 2 (two) times daily. Loletha Grayer, MD  Active   EUTHYROX 88 MCG tablet [id number]  TAKE 1 TABLET BY MOUTH ONCE DAILY BEFORE BREAKFAST [name], DO  Active   FARXIGA 5 MG TABS tablet [id number] Yes Take 2 tablets (10 mg total) by mouth daily before breakfast. [name], DO Taking Active   fluticasone (FLONASE) 50 MCG/ACT nasal spray [id number] Yes Place 1 spray into both  nostrils daily as needed for allergies or rhinitis. [provider] Taking Active Pharmacy Records  furosemide (LASIX) 40 MG tablet [id number] Yes Take 20-40 mg by mouth daily as needed for edema. Only as needed now. [name], DO Taking Active   gabapentin (NEURONTIN) 100 MG capsule [id number]  TAKE 1 CAPSULE BY MOUTH THREE TIMES DAILY [name], [name], DO  Active   HYDROcodone-acetaminophen (NORCO/VICODIN) 5-325 MG tablet [id number]  Take 1 tablet by mouth every 4 (four) hours as needed for moderate pain or severe pain. [name], [name], DO  Active   ipratropium-albuterol (DUONEB) 0.5-2.5 (3) MG/3ML SOLN [id number]  Take 3 mLs by nebulization 4 (four) times daily. [provider]  Active Pharmacy Records  [place] [id number]  1 Device by Does not apply route 4 (four) times daily -  before meals and at bedtime. [name], MD  Active Pharmacy Records  losartan (COZAAR) 25 MG tablet [id number] Yes Take 0.5 tablets (12.5 mg total) by mouth daily. Loel Dubonnet, NP Taking Active   metFORMIN (GLUCOPHAGE) 500  MG tablet [id number] Yes TAKE 2 TABLETS BY MOUTH TWICE DAILY WITH A MEAL [name], [name], DO Taking Active   metoprolol tartrate (LOPRESSOR) 50 MG tablet [id number] Yes Take 1.5 tablets (75 mg total) by mouth 2 (two) times daily. Rise Mu, [name] Taking Active   montelukast (SINGULAIR) 10 MG tablet [id number] Yes Take 10 mg by mouth at bedtime. [provider] Taking Active   ONETOUCH ULTRA test strip [id number]  USE TEST STRIPS TO CHECK GLUCOSE TWICE DAILY. [name], [name], DO  Active   predniSONE (DELTASONE) 5 MG tablet [id number] Yes Take 5 mg by mouth daily with breakfast. [provider] Taking Active   SYMBICORT 160-4.5 MCG/ACT inhaler [id number] Yes Inhale 2 puffs 2 (two) times daily into the lungs.  [provider] Taking Active Pharmacy Records          Patient Active Problem List   Diagnosis Date Noted  . Chronic right hip pain 11/19/2019  . Thrush   . MSSA bacteremia   . Chronic systolic CHF (congestive heart failure) ([place])   . AF (paroxysmal atrial fibrillation) ([place])   . HFrEF (heart failure with reduced ejection fraction) ([place])   . Community acquired pneumonia of left lower lobe of lung   . COPD with acute exacerbation ([place]) 06/26/2019  . Overweight (BMI 25.0-29.9) 07/01/2018  . Osteoarthritis of multiple joints 05/14/2017  . Chronic pain of left knee 05/14/2017  . OSA (obstructive sleep apnea) 02/28/2017  . CKD (chronic kidney disease), stage II 02/28/2017  . Chronic anticoagulation 02/28/2017  . Onychomycosis of left great toe 08/14/2016  . Allergic rhinitis due to allergen 07/31/2016  . Peripheral neuropathy 08/17/2015  . Type 2 diabetes mellitus with hyperlipidemia ([place]) 08/17/2015  . (HFpEF) heart failure with preserved ejection fraction ([place]) 07/25/2015  . Atrial fibrillation with rapid ventricular response ([place]) 12/10/2014  . Centrilobular emphysema ([place]) 11/09/2014  . Bradycardia 11/09/2014  . Cardiac conduction  disorder 11/09/2014  . Benign hypertension with CKD (chronic kidney disease), stage II 11/09/2014  . Bursitis of hip 11/09/2014  . Enlarged prostate 11/09/2014  . Coronary artery disease   . 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 Hypothyroid 05/06/2012  . Hyperlipidemia associated with type 2 diabetes mellitus ([place]) 03/31/2012  . Colon polyp 12/21/2003    Immunization History  Administered Date(s) Administered  . Fluad Quad(high Dose 65+) 12/31/2018  . Influenza, High Dose Seasonal PF 01/06/2015, 02/28/2017  .  Influenza-Unspecified 02/08/2014, 03/01/2018  . Pneumococcal Conjugate-13 01/02/2014  . Pneumococcal Polysaccharide-23 05/01/2012  . Tdap 05/01/2012    Conditions to be addressed/monitored: HTN, HLD, COPD and DMII  Care Plan : PharmD - Medication Management/Medication Assistance  Updates made by Vella Raring, [place] since 07/14/2020 12:00 AM    Problem: Disease Progression     Long-Range Goal: Disease Progression Prevented or Minimized   Start Date: 06/16/2020  Expected End Date: 09/14/2020  This Visit's Progress: On track  Recent Progress: On track  Priority: High  Note:   Current Barriers:  [name] Knowledge deficits related to dietary choices to improve blood sugar control . Financial - difficulty with affording Farxiga through Medicare Part D health plan . Patient APPROVED for patient assistance for Farxiga from [employer] through 04/30/2021  Pharmacist Clinical Goal(s):  [name] Over the next 90 days, patient will achieve control of T2DM as evidenced by A1C < 7%  through collaboration with PharmD and provider.   Interventions: . 1:1 collaboration with [name], DO regarding development and update of comprehensive plan of care as evidenced by provider attestation and co-signature . Inter-disciplinary care team collaboration (see longitudinal plan of care) . Perform chart review.  o Patient seen for Office Visit with Physical Medicine and Rehabilitation on 2/28 for follow  up of acute on chronic right lateral hip pain. Provider advised patient: - To increase gabapentin 100 mg to 3 times a day - Referred to physical therapy at [facility] clinic o Per telephone note from Physical Medicine and Rehabilitation on 3/1 patient called clinic regarding gabapentin dosing and was advised to increase gabapentin to "200 mg at night keeping 100 mg in the morning and in the afternoon" . Today patient reports has started physical therapy once weekly  Medication Assistance . Reports has not yet received supply of Farxiga 10 mg from patient assistance program.  o Note collaborated with PCP and provider sent new Rx for Farxiga 10 mg daily be sent to [facility] (dispensing pharmacy for [employer] patient assistance program) on 2/16 o Patient reports has >15 day supply remaining using Farxiga 5 mg tablets . Collaborate with Harper Simcox today. [facility] CPhT places call to [employer] assistance program. Reports patient should receive supply of new dose of Farxiga to his home in next 7-10 business days  Type 2 Diabetes . Uncontrolled; current treatment: o metformin 500 mg - 2 tablets (1,000 mg) twice daily as directed o Farxiga 5 mg - 2 tablets (10 mg) QAM  o Note patient continues on prednisone 5 mg once daily as directed by pulmonologist . Review recent blood sugar results:  Fasting Blood Glucose After Supper  (~2 hours) Notes  10 - March 165 240   11 - March 142 191   12 - March 202 260   13 - March 142 200   14 - March 139 262* *Larger carbohydrate portion with supper  15 - March 135 264* *Had larger portion of rice for dinner  16 - March 163     o Denies missed doses . Again discuss importance of limiting carbohydrate portion sizes and well-balanced meals o Reports limiting sugary beverages - has cut back on soda, but still drinking some sweet tea o Reports has noticed blood sugar better when eats at home - Will mail patient handout "Dining Out With Diabetes" as requested . Note  patient's mobility limited by knee and back pain.  . Patient denies any side effects or further genital yeast infection symptoms with increased  dose of Farxiga  CHF/HTN . Current treatment:  Losartan 25 mg - 1/2 tablet (12.5 mg) once daily  Metoprolol 75 mg twice daily  Farxiga 5 mg - 2 tablets (10 mg total) daily  Furosemide only as needed as directed by Cardiologist . Review recent BP/HR results   AM BP PM BP  10 - March 121/95, HR 80 122/84, HR 72  11 - March 139/85, HR 79 143/88, HR 67  12 - March 135/92, HR 76 128/78, HR 72  13 - March 121/84, HR 83 123/80, HR 75  14 - March 123/80, HR 67 103/73, HR 75  15 - March 127/85, HR 78 132/84, HR 82  16 - March 126/85, HR 81    . Denies any recent symptoms . Counsel patient on importance of continuing to weigh himself daily and follow directions from Cardiologist for use of PRN furosemide (Lasix).  . Have counseled patient on importance of contacting Cardiologist for BP and HR readings outside of established parameters  Medication/Device Adherence . Note patient uses weekly pillbox to organize medications . Counsel patient to restart using maintenance (Symbicort) inhaler twice daily consistently as directed . Reports using rescue inhaler/nebulizer solution, and other respiratory medications as directed. Confirms rinsing out mouth after use of Symbicort. . Follow up with patient regarding sleep apnea/CPAP o Reports has not been using CPAP as feels like more difficult to use as recently has more mucus build up  Again counsel patient on rational for/encourage patient to use CPAP nightly  Discuss strategies to improve control of allergy symptoms. Patient reports will try using his Flonase daily to see if improves nasal mucus/ability to tolerate CPAP  Also encourage patient to contact DME company if needed for additional support/regarding moisture settings  Patient Goals/Self-Care Activities . Over the next 90 days, patient will:   . Self administers medications as prescribed . Attends all scheduled provider appointments ? Nex appointment with Pulmonology on 4/18 . Calls pharmacy for medication refills . Calls provider office for new concerns or questions  . Patient to check blood sugar regularly as directed and keep log . Patient to keep log when checks blood pressure  Follow Up Plan: Telephone follow up appointment with care management team member scheduled for: 08/02/2020 at 10:15 AM      Medication Assistance: [name] obtained through [facility] [employer] medication assistance program.  Enrollment ends 04/30/2021  Patient's preferred pharmacy is:  [facility] [address] (N), [place])  [address] Phone: [phone] Fax: [phone]  Uses pill box? Yes Pt endorses 100% compliance  Follow Up:  Patient agrees to Care Plan and Follow-up.  Plan: Telephone follow up appointment with care management team member scheduled for:  08/02/2020 at 10:15 AM  [name], PharmD, [place] [phone]

## 2020-07-14 NOTE — Progress Notes (Addendum)
[facility])                                            [place] Team    07/14/2020  Philip BRANDENBURG Sr. Apr 19, 1946 [id number]    Received message from embedded PharmD [name] inquiring if a phone call could be placed to [employer] to check on status of patient's new Farxiga dose delivery.  Care coordination call placed to [employer]. Spoke to [employer]. [name] was able locate the prescription that the provider sent in on 06/16/20 increasing the Farxiga to 10mg . She informed she would release it for processing and shipping. She informed it should arrive to the patient's home in the next 7-10 business days.   [name] also informed that any time a product or dose change is made by the provider, then a follow up call would also need to be made to [employer] to confirm the change was intended.  Message sent to embedded PharmD to update.  [name] P. [name], [name]  [phone]

## 2020-07-17 ENCOUNTER — Other Ambulatory Visit: Payer: Self-pay | Admitting: Family Medicine

## 2020-07-17 ENCOUNTER — Other Ambulatory Visit: Payer: Self-pay | Admitting: Cardiovascular Disease

## 2020-07-17 DIAGNOSIS — E119 Type 2 diabetes mellitus without complications: Secondary | ICD-10-CM

## 2020-07-17 NOTE — Telephone Encounter (Signed)
Requested Prescriptions  Pending Prescriptions Disp Refills  . metFORMIN (GLUCOPHAGE) 500 MG tablet [Pharmacy Med Name: metFORMIN HCl 500 MG Oral Tablet] 360 tablet 1    Sig: TAKE 2 TABLETS BY MOUTH TWICE DAILY WITH A MEAL . APPOINTMENT REQUIRED FOR FUTURE REFILLS     Endocrinology:  Diabetes - Biguanides Failed - 07/17/2020  5:38 PM      Failed - HBA1C is between 0 and 7.9 and within 180 days    Hemoglobin A1C  Date Value Ref Range Status  06/01/2020 8.7  Final    Comment:    [employer] Nurse         Failed - eGFR in normal range and within 360 days    GFR, Est African American  Date Value Ref Range Status  06/24/2018 85 > OR = 60 mL/min/1.73m2 Final   GFR calc Af Amer  Date Value Ref Range Status  05/31/2020 67 >59 mL/min/1.73 Final    Comment:    **In accordance with recommendations from the NKF-ASN Task force,**   [facility] is in the process of updating its eGFR calculation to the   2021 CKD-EPI creatinine equation that estimates kidney function   without a race variable.    GFR, Est Non African American  Date Value Ref Range Status  06/24/2018 74 > OR = 60 mL/min/1.73m2 Final   GFR calc non Af Amer  Date Value Ref Range Status  05/31/2020 58 (L) >59 mL/min/1.73 Final         Passed - Cr in normal range and within 360 days    Creat  Date Value Ref Range Status  06/24/2018 1.02 0.70 - 1.18 mg/dL Final    Comment:    For patients >49 years of age, the reference limit for Creatinine is approximately 13% higher for people identified as African-American. .    Creatinine, Ser  Date Value Ref Range Status  05/31/2020 1.23 0.76 - 1.27 mg/dL Final         Passed - Valid encounter within last 6 months    Recent Outpatient Visits          1 month ago Type 2 diabetes mellitus with hyperlipidemia (HCC)   [facility] [name], Alexander J, DO   2 months ago Acute idiopathic gout of right ankle   [facility] [name], Alexander J, DO   6  months ago Acute conjunctivitis of both eyes, unspecified acute conjunctivitis type   [facility] [name], Alexander J, DO   8 months ago Chronic right hip pain   [facility] [name], Alexander J, DO   11 months ago Acute idiopathic gout of right foot   [facility] [name], Alexander J, DO               

## 2020-07-19 DIAGNOSIS — M5137 Other intervertebral disc degeneration, lumbosacral region: Secondary | ICD-10-CM | POA: Diagnosis not present

## 2020-07-21 ENCOUNTER — Other Ambulatory Visit: Payer: Self-pay | Admitting: Cardiovascular Disease

## 2020-07-26 DIAGNOSIS — M6281 Muscle weakness (generalized): Secondary | ICD-10-CM | POA: Diagnosis not present

## 2020-07-26 DIAGNOSIS — M5137 Other intervertebral disc degeneration, lumbosacral region: Secondary | ICD-10-CM | POA: Diagnosis not present

## 2020-08-02 ENCOUNTER — Telehealth: Payer: Self-pay

## 2020-08-03 ENCOUNTER — Ambulatory Visit (INDEPENDENT_AMBULATORY_CARE_PROVIDER_SITE_OTHER): Payer: Medicare Other

## 2020-08-03 VITALS — Ht 71.0 in | Wt 200.0 lb

## 2020-08-03 DIAGNOSIS — Z0000 Encounter for general adult medical examination without abnormal findings: Secondary | ICD-10-CM | POA: Diagnosis not present

## 2020-08-03 NOTE — Patient Instructions (Signed)
Philip [name] , Thank you for taking time to come for your Medicare Wellness Visit. I appreciate your ongoing commitment to your health goals. Please review the following plan we discussed and let me know if I can assist you in the future.   Screening recommendations/referrals: Colonoscopy: completed 11/29/2013 Recommended yearly ophthalmology/optometry visit for glaucoma screening and checkup Recommended yearly dental visit for hygiene and checkup  Vaccinations: Influenza vaccine: decline Pneumococcal vaccine: completed 01/02/2014 Tdap vaccine: completed 05/01/2012, due 05/01/2022 Shingles vaccine: discussed   Covid-19:  decline  Advanced directives: Advance directive discussed with you today.   Conditions/risks identified: none  Next appointment: Follow up in one year for your annual wellness visit.   Preventive Care 48 Years and Older, Male Preventive care refers to lifestyle choices and visits with your health care provider that can promote health and wellness. What does preventive care include?  A yearly physical exam. This is also called an annual well check.  Dental exams once or twice a year.  Routine eye exams. Ask your health care provider how often you should have your eyes checked.  Personal lifestyle choices, including:  Daily care of your teeth and gums.  Regular physical activity.  Eating a healthy diet.  Avoiding tobacco and drug use.  Limiting alcohol use.  Practicing safe sex.  Taking low doses of aspirin every day.  Taking vitamin and mineral supplements as recommended by your health care provider. What happens during an annual well check? The services and screenings done by your health care provider during your annual well check will depend on your age, overall health, lifestyle risk factors, and family history of disease. Counseling  Your health care provider may ask you questions about your:  Alcohol use.  Tobacco use.  Drug use.  Emotional  well-being.  Home and relationship well-being.  Sexual activity.  Eating habits.  History of falls.  Memory and ability to understand (cognition).  Work and work Statistician. Screening  You may have the following tests or measurements:  Height, weight, and BMI.  Blood pressure.  Lipid and cholesterol levels. These may be checked every 5 years, or more frequently if you are over 18 years old.  Skin check.  Lung cancer screening. You may have this screening every year starting at age 33 if you have a 30-pack-year history of smoking and currently smoke or have quit within the past 15 years.  Fecal occult blood test (FOBT) of the stool. You may have this test every year starting at age 61.  Flexible sigmoidoscopy or colonoscopy. You may have a sigmoidoscopy every 5 years or a colonoscopy every 10 years starting at age 52.  Prostate cancer screening. Recommendations will vary depending on your family history and other risks.  Hepatitis C blood test.  Hepatitis B blood test.  Sexually transmitted disease (STD) testing.  Diabetes screening. This is done by checking your blood sugar (glucose) after you have not eaten for a while (fasting). You may have this done every 1-3 years.  Abdominal aortic aneurysm (AAA) screening. You may need this if you are a current or former smoker.  Osteoporosis. You may be screened starting at age 63 if you are at high risk. Talk with your health care provider about your test results, treatment options, and if necessary, the need for more tests. Vaccines  Your health care provider may recommend certain vaccines, such as:  Influenza vaccine. This is recommended every year.  Tetanus, diphtheria, and acellular pertussis (Tdap, Td) vaccine. You may need  a Td booster every 10 years.  Zoster vaccine. You may need this after age 53.  Pneumococcal 13-valent conjugate (PCV13) vaccine. One dose is recommended after age 68.  Pneumococcal  polysaccharide (PPSV23) vaccine. One dose is recommended after age 20. Talk to your health care provider about which screenings and vaccines you need and how often you need them. This information is not intended to replace advice given to you by your health care provider. Make sure you discuss any questions you have with your health care provider. Document Released: 05/14/2015 Document Revised: 01/05/2016 Document Reviewed: 02/16/2015 Elsevier Interactive Patient Education  2017 [employer] Prevention in the Home Falls can cause injuries. They can happen to people of all ages. There are many things you can do to make your home safe and to help prevent falls. What can I do on the outside of my home?  Regularly fix the edges of walkways and driveways and fix any cracks.  Remove anything that might make you trip as you walk through a door, such as a raised step or threshold.  Trim any bushes or trees on the path to your home.  Use bright outdoor lighting.  Clear any walking paths of anything that might make someone trip, such as rocks or tools.  Regularly check to see if handrails are loose or broken. Make sure that both sides of any steps have handrails.  Any raised decks and porches should have guardrails on the edges.  Have any leaves, snow, or ice cleared regularly.  Use sand or salt on walking paths during winter.  Clean up any spills in your garage right away. This includes oil or grease spills. What can I do in the bathroom?  Use night lights.  Install grab bars by the toilet and in the tub and shower. Do not use towel bars as grab bars.  Use non-skid mats or decals in the tub or shower.  If you need to sit down in the shower, use a plastic, non-slip stool.  Keep the floor dry. Clean up any water that spills on the floor as soon as it happens.  Remove soap buildup in the tub or shower regularly.  Attach bath mats securely with double-sided non-slip rug  tape.  Do not have throw rugs and other things on the floor that can make you trip. What can I do in the bedroom?  Use night lights.  Make sure that you have a light by your bed that is easy to reach.  Do not use any sheets or blankets that are too big for your bed. They should not hang down onto the floor.  Have a firm chair that has side arms. You can use this for support while you get dressed.  Do not have throw rugs and other things on the floor that can make you trip. What can I do in the kitchen?  Clean up any spills right away.  Avoid walking on wet floors.  Keep items that you use a lot in easy-to-reach places.  If you need to reach something above you, use a strong step stool that has a grab bar.  Keep electrical cords out of the way.  Do not use floor polish or wax that makes floors slippery. If you must use wax, use non-skid floor wax.  Do not have throw rugs and other things on the floor that can make you trip. What can I do with my stairs?  Do not leave any items on the  stairs.  Make sure that there are handrails on both sides of the stairs and use them. Fix handrails that are broken or loose. Make sure that handrails are as long as the stairways.  Check any carpeting to make sure that it is firmly attached to the stairs. Fix any carpet that is loose or worn.  Avoid having throw rugs at the top or bottom of the stairs. If you do have throw rugs, attach them to the floor with carpet tape.  Make sure that you have a light switch at the top of the stairs and the bottom of the stairs. If you do not have them, ask someone to add them for you. What else can I do to help prevent falls?  Wear shoes that:  Do not have high heels.  Have rubber bottoms.  Are comfortable and fit you well.  Are closed at the toe. Do not wear sandals.  If you use a stepladder:  Make sure that it is fully opened. Do not climb a closed stepladder.  Make sure that both sides of the  stepladder are locked into place.  Ask someone to hold it for you, if possible.  Clearly mark and make sure that you can see:  Any grab bars or handrails.  First and last steps.  Where the edge of each step is.  Use tools that help you move around (mobility aids) if they are needed. These include:  Canes.  Walkers.  Scooters.  Crutches.  Turn on the lights when you go into a dark area. Replace any light bulbs as soon as they burn out.  Set up your furniture so you have a clear path. Avoid moving your furniture around.  If any of your floors are uneven, fix them.  If there are any pets around you, be aware of where they are.  Review your medicines with your doctor. Some medicines can make you feel dizzy. This can increase your chance of falling. Ask your doctor what other things that you can do to help prevent falls. This information is not intended to replace advice given to you by your health care provider. Make sure you discuss any questions you have with your health care provider. Document Released: 02/11/2009 Document Revised: 09/23/2015 Document Reviewed: 05/22/2014 Elsevier Interactive Patient Education  2017 [employer].

## 2020-08-03 NOTE — Progress Notes (Signed)
I connected with Philip Donath Sr. today by telephone and verified that I am speaking with the correct person using two identifiers. Location patient: home Location provider: work Persons participating in the virtual visit: Philip Donath Sr., Glenna Durand LPN.   I discussed the limitations, risks, security and privacy concerns of performing an evaluation and management service by telephone and the availability of in person appointments. I also discussed with the patient that there may be a patient responsible charge related to this service. The patient expressed understanding and verbally consented to this telephonic visit.    Interactive audio and video telecommunications were attempted between this provider and patient, however failed, due to patient having technical difficulties OR patient did not have access to video capability.  We continued and completed visit with audio only.     Vital signs may be patient reported or missing.  Subjective:   Philip ROSTAD Sr. is a 75 y.o. male who presents for Medicare Annual/Subsequent preventive examination.  Review of Systems     Cardiac Risk Factors include: advanced age (>59mn, >>21women);diabetes mellitus;dyslipidemia;male gender;sedentary lifestyle     Objective:    Today's Vitals   08/03/20 0937 08/03/20 0938  Weight: 200 lb (90.7 kg)   Height: _0  (1.803 m)   PainSc:  4    Body mass index is 27.89 kg/m.  Advanced Directives 08/03/2020 06/27/2019 06/26/2019 12/31/2018 12/25/2017 10/03/2017 09/19/2016  Does Patient Have a Medical Advance Directive? No - _1   Would patient like information on creating a medical advance directive? - No - Patient declined - Yes (MAU/Ambulatory/Procedural Areas - Information given) No - Patient declined No - Patient declined Yes (MAU/Ambulatory/Procedural Areas - Information given)  Pre-existing out of facility DNR order (yellow form or pink MOST form) - - - - - - -    Current Medications  (verified) Outpatient Encounter Medications as of 08/03/2020  Medication Sig  . acetaminophen (TYLENOL) 500 MG tablet Take 500 mg by mouth every 6 (six) hours as needed.  . Albuterol Sulfate 108 (90 Base) MCG/ACT AEPB Inhale 2 puffs into the lungs every 6 (six) hours as needed (shortness of breath).   . Artificial Tear Ointment (DRY EYES OP) Place 1 drop into both eyes daily as needed (for dry eyes).  .Marland Kitchenatorvastatin (LIPITOR) 40 MG tablet Take 1 tablet by mouth once daily  . Blood Glucose Monitoring Suppl (ONE TOUCH ULTRA 2) w/Device KIT Use to check blood sugar as advised up to twice a day  . cetirizine (ZYRTEC) 10 MG tablet Take 10 mg by mouth daily.   . diclofenac sodium (VOLTAREN) 1 % GEL Apply 2 g topically 3 (three) times daily as needed (Use on elbow bursitis and knee).  .Marland KitchenELIQUIS 5 MG TABS tablet Take 1 tablet by mouth twice daily  . FARXIGA 10 MG TABS tablet Take 1 tablet (10 mg total) by mouth daily before breakfast.  . fluticasone (FLONASE) 50 MCG/ACT nasal spray Place 1 spray into both nostrils daily as needed for allergies or rhinitis.  . furosemide (LASIX) 40 MG tablet Take 20-40 mg by mouth daily as needed for edema. Only as needed now.  . gabapentin (NEURONTIN) 100 MG capsule TAKE 1 CAPSULE BY MOUTH THREE TIMES DAILY (Patient taking differently: Taking 1 capsule (100 mg) QAM, 1 capsule (100 mg) each afternoon and 2 capsules (200 mg) QHS per Physical Medicine and Rehabilitation)  . ipratropium-albuterol (DUONEB) 0.5-2.5 (3) MG/3ML SOLN Take 3 mLs by nebulization 4 (four) times  daily.  [name] LANCETS ULTRA THIN MISC 1 Device by Does not apply route 4 (four) times daily -  before meals and at bedtime.  [name] levothyroxine (SYNTHROID) 88 MCG tablet TAKE 1 TABLET BY MOUTH ONCE DAILY BEFORE BREAKFAST  . losartan (COZAAR) 25 MG tablet Take 1/2 (one-half) tablet by mouth once daily  . metFORMIN (GLUCOPHAGE) 500 MG tablet TAKE 2 TABLETS BY MOUTH TWICE DAILY WITH A MEAL . APPOINTMENT REQUIRED FOR  FUTURE REFILLS  . metoprolol tartrate (LOPRESSOR) 50 MG tablet Take 1.5 tablets (75 mg total) by mouth 2 (two) times daily.  . montelukast (SINGULAIR) 10 MG tablet Take 10 mg by mouth at bedtime.  Glory Rosebush ULTRA test strip USE TEST STRIPS TO CHECK GLUCOSE TWICE DAILY.  [name] predniSONE (DELTASONE) 5 MG tablet Take 5 mg by mouth daily with breakfast.  . SYMBICORT 160-4.5 MCG/ACT inhaler Inhale 2 puffs 2 (two) times daily into the lungs.   . cyclobenzaprine (FLEXERIL) 10 MG tablet Take 10 mg by mouth 3 (three) times daily. (Patient not taking: Reported on 08/03/2020)  . Ensure Max Protein (ENSURE MAX PROTEIN) LIQD Take 330 mLs (11 oz total) by mouth 2 (two) times daily. (Patient not taking: Reported on 08/03/2020)  . HYDROcodone-acetaminophen (NORCO/VICODIN) 5-325 MG tablet Take 1 tablet by mouth every 4 (four) hours as needed for moderate pain or severe pain. (Patient not taking: Reported on 08/03/2020)   No facility-administered encounter medications on file as of 08/03/2020.    Allergies (verified) Bydureon [exenatide], Clindamycin/lincomycin, Penicillins, Sulfa antibiotics, Spiriferis, Spiriva handihaler [tiotropium bromide monohydrate], and Ivp dye [iodinated diagnostic agents]   History: Past Medical History:  Diagnosis Date  . Arthritis    a. right knee  . Asthma   . Atrial tachycardia ([place])   . 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 etiology.  [name] COPD (chronic obstructive pulmonary disease) ([place])   . Coronary artery disease    a. 05/2012 Cath: severe 3VD-->CABG x 4 by Dr. Servando Snare in 1/2014[place], SVG->LCX & SVG->RPDA; b. 06/2014 MV: no ischemia/infarct.  . Diastolic dysfunction    a. 06/2015 Echo: EF 50-55%, Gr1 DD. Mild MR. Mildly dil LA. Nl RV fxn. Nl PASP.  [name] Edema    LEGS/ FEET  . Essential hypertension   . History of bronchitis   . HOH (hard of hearing)   . Hypercholesteremia   . Neuropathy   . PAF (paroxysmal atrial fibrillation) ([facility])    a. CHA2DS2VASc = 4-->Eliquis.  .  Shingles   . Sleep apnea    NO CPAP  . Type II diabetes mellitus ([place])    Past Surgical History:  Procedure Laterality Date  . ANTERIOR CERVICAL DECOMP/DISCECTOMY FUSION  ~ 2009  . BACK SURGERY     CERVICAL FUSION  . CARDIAC CATHETERIZATION  05/06/2012   Significant three-vessel coronary artery disease with normal ejection fraction  . CATARACT EXTRACTION W/PHACO Right 08/15/2017   Procedure: CATARACT EXTRACTION PHACO AND INTRAOCULAR LENS PLACEMENT (IOC);  Surgeon: Birder Robson, MD;  Location: [facility];  Service: Ophthalmology;  Laterality: Right;  [place] 00:23 AP% 13.3 CDE 3.11 Fluid pack lot # [id number]  . CATARACT EXTRACTION W/PHACO Left 10/03/2017   Procedure: CATARACT EXTRACTION PHACO AND INTRAOCULAR LENS PLACEMENT (IOC);  Surgeon: Birder Robson, MD;  Location: [facility];  Service: Ophthalmology;  Laterality: Left;  Lot #[id number] H [place]: 00:21.2 AP%:13.9 CDE: 2.96  . CORONARY ANGIOPLASTY    . CORONARY ARTERY BYPASS GRAFT  05/07/2012   Procedure: CORONARY ARTERY BYPASS GRAFTING (CABG);  Surgeon: [name], MD;  Location: [place];  Service: Open Heart Surgery;  Laterality: N/A;  times three  . INTRAOPERATIVE TRANSESOPHAGEAL ECHOCARDIOGRAM  05/07/2012   Procedure: INTRAOPERATIVE TRANSESOPHAGEAL ECHOCARDIOGRAM;  Surgeon: [name], MD;  Location: [place];  Service: Open Heart Surgery;  Laterality: N/A;  . KNEE ARTHROSCOPY  ~ 2000   "right" (05/06/2012)  . KNEE SURGERY  2003   right  . NECK SURGERY  2008   Plate in neck  . TEE WITHOUT CARDIOVERSION N/A 07/04/2019   Procedure: TRANSESOPHAGEAL ECHOCARDIOGRAM (TEE);  Surgeon: Nelva Bush, MD;  Location: [facility];  Service: Cardiovascular;  Laterality: N/A;   Family History  Problem Relation Age of Onset  . Heart disease Mother   . Clotting disorder Mother   . Hypertension Sister    Social History   Socioeconomic History  . Marital status: Married    Spouse name: Not on file  . Number of children: Not on file  . Years of  education: Not on file  . Highest education level: Some college, no degree  Occupational History  . Occupation: retired  Tobacco Use  . Smoking status: Former Smoker    Packs/day: 1.00    Years: 8.00    Pack years: 8.00    Types: Cigarettes    Quit date: 12/31/1967    Years since quitting: 52.6  . Smokeless tobacco: Former Network engineer  . Vaping Use: Never used  Substance and Sexual Activity  . Alcohol use: No    Alcohol/week: 0.0 standard drinks    Comment: 05/06/2012 "last alcohol several years ago; never had problem wit"  . Drug use: No  . Sexual activity: Not Currently  Other Topics Concern  . Not on file  Social History Narrative   ** Merged History Encounter **          Working part time - not currently working because of covid-19    Social Determinants of Radio broadcast assistant Strain: Low Risk   . Difficulty of Paying Living Expenses: Not hard at all  Food Insecurity: No Food Insecurity  . Worried About Charity fundraiser in the Last Year: Never true  . Ran Out of Food in the Last Year: Never true  Transportation Needs: No Transportation Needs  . Lack of Transportation (Medical): No  . Lack of Transportation (Non-Medical): No  Physical Activity: Inactive  . Days of Exercise per Week: 0 days  . Minutes of Exercise per Session: 0 min  Stress: Stress Concern Present  . Feeling of Stress : To some extent  Social Connections: Not on file    Tobacco Counseling Counseling given: Not Answered   Clinical Intake:  Pre-visit preparation completed: Yes  Pain : 0-10 Pain Score: 4  Pain Type: Chronic pain Pain Location: Generalized Pain Descriptors / Indicators: Aching Pain Frequency: Constant     Nutritional Status: BMI 25 -29 Overweight Nutritional Risks: None Diabetes: Yes  How often do you need to have someone help you when you read instructions, pamphlets, or other written materials from your doctor or pharmacy?: 1 - Never What is the last  grade level you completed in school?: some college  Diabetic? Yes Nutrition Risk Assessment:  Has the patient had any N/V/D within the last 2 months?  No  Does the patient have any non-healing wounds?  No  Has the patient had any unintentional weight loss or weight gain?  No   Diabetes:  Is the patient diabetic?  Yes  If diabetic, was a CBG obtained today?  No  Did the patient bring in their glucometer from home?  No  How often do you monitor your CBG's? Twice daily.   Financial Strains and Diabetes Management:  Are you having any financial strains with the device, your supplies or your medication? No .  Does the patient want to be seen by Chronic Care Management for management of their diabetes?  No  Would the patient like to be referred to a Nutritionist or for Diabetic Management?  No   Diabetic Exams:  Diabetic Eye Exam: Overdue for diabetic eye exam. Pt has been advised about the importance in completing this exam. Patient advised to call and schedule an eye exam. Diabetic Foot Exam: Completed 06/10/2020   Interpreter Needed?: No  Information entered by :: NAllen LPN   Activities of Daily Living In your present state of health, do you have any difficulty performing the following activities: 08/03/2020  Hearing? Y  Vision? N  Difficulty concentrating or making decisions? N  Walking or climbing stairs? N  Dressing or bathing? N  Doing errands, shopping? N  Preparing Food and eating ? N  Using the Toilet? N  In the past six months, have you accidently leaked urine? N  Do you have problems with loss of bowel control? N  Managing your Medications? N  Managing your Finances? N  Housekeeping or managing your Housekeeping? N  Some recent data might be hidden    Patient Care Team: [name], DO as PCP - General (Family Medicine) [name], MD as PCP - Cardiology (Cardiology) [name], MD as Consulting Physician (Cardiology) Leanor Kail, MD (Inactive) (Orthopedic Surgery) Erby Pian, MD as Referring Physician (Specialist) Dhalla, Virl Diamond, RPH-CPP as Pharmacist Minor, Dalbert Garnet, RN (Inactive) as Case Manager  Indicate any recent Medical Services you may have received from other than [facility] providers in the past year (date may be approximate).     Assessment:   This is a routine wellness examination for [employer].  Hearing/Vision screen No exam data present  Dietary issues and exercise activities discussed: Current Exercise Habits: The patient does not participate in regular exercise at present  Goals    .  Increase water intake      Recommend drinking 6-8 glasses of water a day.    .  Patient Stated      08/03/2020, no goals    .  Pharmacy Goals      Our goal A1c is less than 7%. This corresponds with fasting sugars less than 130 and 2 hour after meal sugars less than 180. Please check your blood sugar and keep log of results  Please continue to monitor your home weight, blood pressure and heart rate  Our goal bad cholesterol, or LDL, is less than 70 . This is why it is important to continue taking your atorvastatin   Feel free to call me with any questions or concerns. I look forward to our next call!  [name], PharmD, [facility] [place] [phone]     .  RN-I would like to work on my COPD (pt-stated)      Current Barriers:  [name] Knowledge deficits related to basic understanding of COPD disease process . Knowledge deficits related to basic COPD self care/management . Knowledge deficit related to basic understanding of how to use inhalers and how inhaled medications work   Case Manager Clinical Goal(s):  Over the next 90 days patient will report using inhalers as prescribed including rinsing mouth after use  Over the next 90 days patient will report utilizing pursed lip breathing for shortness of breath  Over the next 90 days,  patient will be able to verbalize understanding of COPD action plan and when to seek appropriate levels of medical care  Over the next 90 days, patient will engage in lite exercise as tolerated to build/regain stamina and strength and reduce shortness of breath through activity tolerance  Over the next 90 days, patient will verbalize basic understanding of COPD disease process and self care activities  Over the next 90 days, patient will not be hospitalized for COPD exacerbation   Interventions:   Provided patient with basic written and verbal COPD education on self care/management/and exacerbation prevention   Provided patient with COPD action plan and reinforced importance of daily self assessment  Discussed Pulmonary Rehab and offered to assist with referral placement  Advised patient to self assesses COPD action plan zone and make appointment with provider if in the yellow zone for 48 hours without improvement.  Provided patient with education about the role of exercise in the management of COPD  Advised patient to engage in light exercise as tolerated 3-5 days a week   Reviewed COPD education and information about pulmonary rehab being open and benefits-Patient continues to want to wait on this, states at next pulmonary appointment patient has to have PFTs and testing, will make a determination at that time.  Talked with patient about recent follow ups with pulmonary, PCP and cardiology. Patient felt he was doing well, still having some mild congestion but overall well.   Discussed Holiday eating and maintaining sugar control.   Patient Self Care Activities:  Takes medications as prescribed including inhalers  Engages in light exercise 3-5 days a week   Please see past updates related to this goal by clicking on the "Past Updates" button in the selected goal      .  Weight < 200 lb (90.719 kg)      Pt wants to loose 30 pounds within year.      Depression Screen PHQ 2/9  Scores 08/03/2020 03/12/2019 12/31/2018 12/02/2018 07/01/2018 04/01/2018 12/25/2017  PHQ - 2 Score 0 0 0 0 0 0 0  PHQ- 9 Score - - - - - - -    Fall Risk Fall Risk  08/03/2020 03/12/2019 12/31/2018 12/02/2018 07/01/2018  Falls in the past year? 0 0 0 0 0  Number falls in past yr: - - - - -  Injury with Fall? - - - - -  Risk for fall due to : Medication side effect - - - -  Follow up Falls evaluation completed;Education provided;Falls prevention discussed Falls evaluation completed - Falls evaluation completed Falls evaluation completed    FALL RISK PREVENTION PERTAINING TO THE HOME:  Any stairs in or around the home? Yes  If so, are there any without handrails? No  Home free of loose throw rugs in walkways, pet beds, electrical cords, etc? Yes  Adequate lighting in your home to reduce risk of falls? Yes   ASSISTIVE DEVICES UTILIZED TO PREVENT FALLS:  Life alert? No  Use of a cane, walker or w/c? No  Grab bars in the bathroom? Yes  Shower chair or bench in shower? Yes  Elevated toilet seat or a handicapped toilet? Yes   TIMED UP AND GO:  Was the test performed? No . .  Cognitive Function:  MMSE - Mini Mental State Exam 01/06/2015  Orientation to time 5  Orientation to Place 5  Registration 3  Attention/ Calculation 5  Recall 3  Language- name 2 objects 2  Language- repeat 1  Language- follow 3 step command 3  Language- read & follow direction 1  Write a sentence 1  Copy design 1  Total score 30     6CIT Screen 08/03/2020 12/31/2018 12/25/2017 09/19/2016  What Year? 0 points 0 points 0 points 0 points  What month? 0 points 0 points 0 points 0 points  What time? 0 points 0 points 0 points 0 points  Count back from 20 0 points 0 points 0 points 0 points  Months in reverse 0 points 0 points 0 points 0 points  Repeat phrase 0 points 0 points 0 points 4 points  Total Score 0 0 0 4    Immunizations Immunization History  Administered Date(s) Administered  . Fluad Quad(high Dose 65+)  12/31/2018  . Influenza, High Dose Seasonal PF 01/06/2015, 02/28/2017  . Influenza-Unspecified 02/08/2014, 03/01/2018  . Pneumococcal Conjugate-13 01/02/2014  . Pneumococcal Polysaccharide-23 05/01/2012  . Tdap 05/01/2012    TDAP status: Up to date  Flu Vaccine status: Declined, Education has been provided regarding the importance of this vaccine but patient still declined. Advised may receive this vaccine at local pharmacy or Health Dept. Aware to provide a copy of the vaccination record if obtained from local pharmacy or Health Dept. Verbalized acceptance and understanding.  Pneumococcal vaccine status: Up to date  Covid-19 vaccine status: Declined, Education has been provided regarding the importance of this vaccine but patient still declined. Advised may receive this vaccine at local pharmacy or Health Dept.or vaccine clinic. Aware to provide a copy of the vaccination record if obtained from local pharmacy or Health Dept. Verbalized acceptance and understanding.  Qualifies for Shingles Vaccine? Yes   Zostavax completed No   Shingrix Completed?: No.    Education has been provided regarding the importance of this vaccine. Patient has been advised to call insurance company to determine out of pocket expense if they have not yet received this vaccine. Advised may also receive vaccine at local pharmacy or Health Dept. Verbalized acceptance and understanding.  Screening Tests Health Maintenance  Topic Date Due  . COVID-19 Vaccine (1) Never done  . Fecal DNA (Cologuard)  Never done  . OPHTHALMOLOGY EXAM  07/08/2019  . INFLUENZA VACCINE  11/29/2020  . HEMOGLOBIN A1C  11/29/2020  . FOOT EXAM  06/10/2021  . TETANUS/TDAP  05/01/2022  . Hepatitis C Screening  Completed  . PNA vac Low Risk Adult  Completed  . HPV VACCINES  Aged Out    Health Maintenance  Health Maintenance Due  Topic Date Due  . COVID-19 Vaccine (1) Never done  . Fecal DNA (Cologuard)  Never done  . OPHTHALMOLOGY EXAM   07/08/2019    Colorectal cancer screening: Type of screening: Colonoscopy. Completed 11/29/2013. Repeat every 10 years  Lung Cancer Screening: (Low Dose CT Chest recommended if Age 24-80 years, 30 pack-year currently smoking OR have quit w/in 15years.) does not qualify.   Lung Cancer Screening Referral: no  Additional Screening:  Hepatitis C Screening: does qualify; Completed 05/01/1993  Vision Screening: Recommended annual ophthalmology exams for early detection of glaucoma and other disorders of the eye. Is the patient up to date with their annual eye exam?  No  Who is the provider or what is the name of the office in which the patient attends  annual eye exams? [facility] If pt is not established with a provider, would they like to be referred to a provider to establish care? No .   Dental Screening: Recommended annual dental exams for proper oral hygiene  Community Resource Referral / Chronic Care Management: CRR required this visit?  No   CCM required this visit?  No      Plan:     I have personally reviewed and noted the following in the patient's chart:   . Medical and social history . Use of alcohol, tobacco or illicit drugs  . Current medications and supplements . Functional ability and status . Nutritional status . Physical activity . Advanced directives . List of other physicians . Hospitalizations, surgeries, and ER visits in previous 12 months . Vitals . Screenings to include cognitive, depression, and falls . Referrals and appointments  In addition, I have reviewed and discussed with patient certain preventive protocols, quality metrics, and best practice recommendations. A written personalized care plan for preventive services as well as general preventive health recommendations were provided to patient.     Kellie Simmering, LPN   01/06/2819   Nurse Notes:

## 2020-08-16 DIAGNOSIS — R0600 Dyspnea, unspecified: Secondary | ICD-10-CM | POA: Diagnosis not present

## 2020-08-16 DIAGNOSIS — J439 Emphysema, unspecified: Secondary | ICD-10-CM | POA: Diagnosis not present

## 2020-08-16 DIAGNOSIS — J310 Chronic rhinitis: Secondary | ICD-10-CM | POA: Diagnosis not present

## 2020-08-17 DIAGNOSIS — M5136 Other intervertebral disc degeneration, lumbar region: Secondary | ICD-10-CM | POA: Diagnosis not present

## 2020-08-17 DIAGNOSIS — M5416 Radiculopathy, lumbar region: Secondary | ICD-10-CM | POA: Diagnosis not present

## 2020-08-17 DIAGNOSIS — M5126 Other intervertebral disc displacement, lumbar region: Secondary | ICD-10-CM | POA: Diagnosis not present

## 2020-08-18 ENCOUNTER — Ambulatory Visit (INDEPENDENT_AMBULATORY_CARE_PROVIDER_SITE_OTHER): Payer: Medicare Other | Admitting: Pharmacist

## 2020-08-18 DIAGNOSIS — E785 Hyperlipidemia, unspecified: Secondary | ICD-10-CM | POA: Diagnosis not present

## 2020-08-18 DIAGNOSIS — E1169 Type 2 diabetes mellitus with other specified complication: Secondary | ICD-10-CM

## 2020-08-18 DIAGNOSIS — N182 Chronic kidney disease, stage 2 (mild): Secondary | ICD-10-CM

## 2020-08-18 DIAGNOSIS — I129 Hypertensive chronic kidney disease with stage 1 through stage 4 chronic kidney disease, or unspecified chronic kidney disease: Secondary | ICD-10-CM

## 2020-08-18 DIAGNOSIS — J432 Centrilobular emphysema: Secondary | ICD-10-CM

## 2020-08-18 NOTE — Chronic Care Management (AMB) (Signed)
Chronic Care Management Pharmacy Note  08/18/2020 Name:  Philip BATZEL Sr. MRN:  [id number] DOB:  Jan 18, 1946  Subjective: Philip Potters Sr. is an 75 y.o. year old male who is a primary patient of [name], DO.  The CCM team was consulted for assistance with disease management and care coordination needs.    Engaged with patient by telephone for follow up visit in response to provider referral for pharmacy case management and/or care coordination services.   Consent to Services:  The patient was given information about Chronic Care Management services, agreed to services, and gave verbal consent prior to initiation of services.  Please see initial visit note for detailed documentation.   Patient Care Team: [name], DO as PCP - General (Family Medicine) [name], MD as PCP - Cardiology (Cardiology) [name], MD as Consulting Physician (Cardiology) Leanor Kail, MD (Inactive) (Orthopedic Surgery) Erby Pian, MD as Referring Physician (Specialist) Daiya Tamer, Virl Diamond, RPH-CPP as Pharmacist Minor, Dalbert Garnet, RN (Inactive) as Case Manager   Recent consult visits: Office Visit with [facility] Pulmonology on 4/18 for follow up Office Visit with [facility] Medicine [facility] Rehabilitation on 4/19 for follow up of acute on chronic right lateral hip pain   Hospital visits: None in previous 6 months  Objective:  Lab Results  Component Value Date   CREATININE 1.23 05/31/2020   CREATININE 1.07 11/21/2019   CREATININE 1.09 08/26/2019    Lab Results  Component Value Date   HGBA1C 8.7 06/01/2020   Last diabetic Eye exam:  Lab Results  Component Value Date/Time   HMDIABEYEEXA No Retinopathy 07/08/2018 12:00 AM    Last diabetic Foot exam: No results found for: HMDIABFOOTEX      Component Value Date/Time   CHOL 145 11/21/2019 0915   CHOL 139 12/14/2014 0834   TRIG 99 11/21/2019 0915   HDL 67 11/21/2019  0915   HDL 43 12/14/2014 0834   CHOLHDL 2.2 11/21/2019 0915   VLDL 20 11/21/2019 0915   LDLCALC 58 11/21/2019 0915   LDLCALC 61 06/24/2018 0846    Hepatic Function Latest Ref Rng & Units 05/31/2020 11/21/2019 07/11/2019  Total Protein 6.0 - 8.5 g/dL 5.9(L) 6.6 -  Albumin 3.7 - 4.7 g/dL 4.2 4.1 3.1(A)  AST 0 - 40 IU/L _0 ALT 0 - 44 IU/L 14 21 8(A)  Alk Phosphatase 44 - 121 IU/L 87 60 -  Total Bilirubin 0.0 - 1.2 mg/dL 0.3 0.9 -  Bilirubin, Direct 0.00 - 0.40 mg/dL - - -    ASCVD Risk The 10-year ASCVD risk score Mikey Bussing DC Jr., et al., 2013) is: 28.6%   Values used to calculate the score:     Age: 53 years     Sex: Male     Is Non-Hispanic African American: No     Diabetic: Yes     Tobacco smoker: No     Systolic Blood Pressure: 080 mmHg     Is BP treated: Yes     HDL Cholesterol: 67 mg/dL     Total Cholesterol: 145 mg/dL     Social History   Tobacco Use  Smoking Status Former Smoker  . Packs/day: 1.00  . Years: 8.00  . Pack years: 8.00  . Types: Cigarettes  . Quit date: 12/31/1967  . Years since quitting: 52.6  Smokeless Tobacco Former User   BP Readings from Last 3 Encounters:  06/10/20 100/65  05/31/20 112/68  05/07/20  104/68   Pulse Readings from Last 3 Encounters:  06/10/20 85  05/31/20 67  05/07/20 83   Wt Readings from Last 3 Encounters:  08/03/20 200 lb (90.7 kg)  06/10/20 205 lb 12.8 oz (93.4 kg)  05/31/20 206 lb (93.4 kg)    Assessment: Review of patient past medical history, allergies, medications, health status, including review of consultants reports, laboratory and other test data, was performed as part of comprehensive evaluation and provision of chronic care management services.   SDOH:  (Social Determinants of Health) assessments and interventions performed: none   CCM Care Plan  Allergies  Allergen Reactions  . Bydureon [Exenatide] Anaphylaxis    Possible from bydureon, not 100% confirmed  . Clindamycin/Lincomycin Hives and  Itching  . Penicillins Hives, Itching, Rash and Other (See Comments)    Has patient had a PCN reaction causing immediate rash, facial/tongue/throat swelling, SOB or lightheadedness with hypotension: Yes Has patient had a PCN reaction causing severe rash involving mucus membranes or skin necrosis: No Has patient had a PCN reaction that required hospitalization: No Has patient had a PCN reaction occurring within the last 10 years: No If all of the above answers are "NO", then may proceed with Cephalosporin use.  . Sulfa Antibiotics Shortness Of Breath  . Spiriferis Other (See Comments)    Affected breathing  . Spiriva Handihaler [Tiotropium Bromide Monohydrate]     Affected breathing  . Ivp Dye [Iodinated Diagnostic Agents] Rash and Other (See Comments)    Redness    Medications Reviewed Today    Reviewed by Vella Raring, RPH-CPP (Pharmacist) on 08/18/20 at [phone]  Med List Status: <None>  Medication Order Taking? Sig Documenting Provider Last Dose Status Informant  acetaminophen (TYLENOL) 500 MG tablet [id number] Yes Take 500 mg by mouth every 6 (six) hours as needed. [provider] Taking Active   Albuterol Sulfate 108 (90 Base) MCG/ACT AEPB [id number] Yes Inhale 2 puffs into the lungs every 6 (six) hours as needed (shortness of breath).  [provider] Taking Active Pharmacy Records  Artificial Tear Ointment (DRY EYES OP) [id number]  Place 1 drop into both eyes daily as needed (for dry eyes). [provider]  Active Pharmacy Records  atorvastatin (LIPITOR) 40 MG tablet [id number] Yes Take 1 tablet by mouth once daily [name], MD Taking Active   Blood Glucose Monitoring Suppl (ONE TOUCH ULTRA 2) w/Device KIT [id number]  Use to check blood sugar as advised up to twice a day [name], DO  Active Pharmacy Records  cetirizine (ZYRTEC) 10 MG tablet [id number] Yes Take 10 mg by mouth daily.  [provider] Taking Active Pharmacy  Records  cyclobenzaprine (FLEXERIL) 10 MG tablet [id number] No Take 10 mg by mouth 3 (three) times daily.  Patient not taking: No sig reported   [provider] Not Taking Active   diclofenac sodium (VOLTAREN) 1 % GEL [id number] No Apply 2 g topically 3 (three) times daily as needed (Use on elbow bursitis and knee).  Patient not taking: Reported on 08/18/2020   [name], DO Not Taking Active Pharmacy Records  ELIQUIS 5 MG TABS tablet [id number] Yes Take 1 tablet by mouth twice daily [name], MD Taking Active   FARXIGA 10 MG TABS tablet [id number] Yes Take 1 tablet (10 mg total) by mouth daily before breakfast. [name], DO Taking Active   fluticasone (FLONASE) 50 MCG/ACT nasal spray [id number] Yes Place 1 spray into both nostrils daily as needed  for allergies or rhinitis. [provider] Taking Active Pharmacy Records  furosemide (LASIX) 40 MG tablet [id number] No Take 20-40 mg by mouth daily as needed for edema. Only as needed now.  Patient not taking: Reported on 08/18/2020   [name], DO Not Taking Active   gabapentin (NEURONTIN) 100 MG capsule [id number] Yes TAKE 1 CAPSULE BY MOUTH THREE TIMES DAILY [name] [name], DO Taking Active   HYDROcodone-acetaminophen (NORCO/VICODIN) 5-325 MG tablet [id number] No Take 1 tablet by mouth every 4 (four) hours as needed for moderate pain or severe pain.  Patient not taking: No sig reported   [name], DO Not Taking Active   ipratropium-albuterol (DUONEB) 0.5-2.5 (3) MG/3ML SOLN [id number] Yes Take 3 mLs by nebulization 4 (four) times daily. [provider] Taking Active Pharmacy Records  [place] [id number]  1 Device by Does not apply route 4 (four) times daily -  before meals and at bedtime. [name], MD  Active Pharmacy Records  levothyroxine (SYNTHROID) 88 MCG tablet [id number] Yes TAKE 1 TABLET BY MOUTH ONCE DAILY BEFORE BREAKFAST  [name], [name], DO Taking Active   losartan (COZAAR) 25 MG tablet [id number] Yes Take 1/2 (one-half) tablet by mouth once daily Loel Dubonnet, NP Taking Active   metFORMIN (GLUCOPHAGE) 500 MG tablet [id number] Yes TAKE 2 TABLETS BY MOUTH TWICE DAILY WITH A MEAL . APPOINTMENT REQUIRED FOR FUTURE REFILLS [name], DO Taking Active   metoprolol tartrate (LOPRESSOR) 50 MG tablet [id number] Yes Take 1.5 tablets (75 mg total) by mouth 2 (two) times daily. Rise Mu, [name] Taking Active   montelukast (SINGULAIR) 10 MG tablet [id number] Yes Take 10 mg by mouth at bedtime. [provider] Taking Active   ONETOUCH ULTRA test strip [id number]  USE TEST STRIPS TO CHECK GLUCOSE TWICE DAILY. [name], [name], DO  Active   predniSONE (DELTASONE) 5 MG tablet [id number] Yes Take 5 mg by mouth every other day. Reports currently taking every other day with plan to continue for 10 days and then stop per Dr. Raul Del [provider] Taking Active   SYMBICORT 160-4.5 MCG/ACT inhaler [id number] Yes Inhale 2 puffs 2 (two) times daily into the lungs.  [provider] Taking Active Pharmacy Records          Patient Active Problem List   Diagnosis Date Noted  . Chronic right hip pain 11/19/2019  . Thrush   . MSSA bacteremia   . Chronic systolic CHF (congestive heart failure) ([place])   . AF (paroxysmal atrial fibrillation) ([place])   . HFrEF (heart failure with reduced ejection fraction) ([place])   . Community acquired pneumonia of left lower lobe of lung   . COPD with acute exacerbation ([place]) 06/26/2019  . Overweight (BMI 25.0-29.9) 07/01/2018  . Osteoarthritis of multiple joints 05/14/2017  . Chronic pain of left knee 05/14/2017  . OSA (obstructive sleep apnea) 02/28/2017  . CKD (chronic kidney disease), stage II 02/28/2017  . Chronic anticoagulation 02/28/2017  . Onychomycosis of left great toe 08/14/2016  . Allergic rhinitis due to allergen 07/31/2016  .  Peripheral neuropathy 08/17/2015  . Type 2 diabetes mellitus with hyperlipidemia ([place]) 08/17/2015  . (HFpEF) heart failure with preserved ejection fraction ([place]) 07/25/2015  . Atrial fibrillation with rapid ventricular response ([place]) 12/10/2014  . Centrilobular emphysema ([place]) 11/09/2014  . Bradycardia 11/09/2014  . 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 Benign hypertension with CKD (chronic kidney disease), stage II 11/09/2014  . Bursitis of hip  11/09/2014  . Enlarged prostate 11/09/2014  . Coronary artery disease   . 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 Hypothyroid 05/06/2012  . Hyperlipidemia associated with type 2 diabetes mellitus ([place]) 03/31/2012  . Colon polyp 12/21/2003    Immunization History  Administered Date(s) Administered  . Fluad Quad(high Dose 65+) 12/31/2018  . Influenza, High Dose Seasonal PF 01/06/2015, 02/28/2017  . Influenza-Unspecified 02/08/2014, 03/01/2018  . Pneumococcal Conjugate-13 01/02/2014  . Pneumococcal Polysaccharide-23 05/01/2012  . Tdap 05/01/2012    Conditions to be addressed/monitored:  HTN, HLD, COPD and DMII  Care Plan : PharmD - Medication Management/Medication Assistance  Updates made by Vella Raring, RPH-CPP since 08/18/2020 12:00 AM    Problem: Disease Progression     Long-Range Goal: Disease Progression Prevented or Minimized   Start Date: 06/16/2020  Expected End Date: 09/14/2020  This Visit's Progress: On track  Recent Progress: On track  Priority: High  Note:   Current Barriers:  [name] Knowledge deficits related to dietary choices to improve blood sugar control . Financial - difficulty with affording Farxiga through Medicare Part D health plan . Patient APPROVED for patient assistance for Farxiga from [employer] through 04/30/2021  Pharmacist Clinical Goal(s):  [name] Over the next 90 days, patient will achieve control of T2DM as evidenced by A1C through collaboration with PharmD and provider.   Interventions: . 1:1 collaboration with  [name], DO regarding development and update of comprehensive plan of care as evidenced by provider attestation and co-signature . Inter-disciplinary care team collaboration (see longitudinal plan of care) . Perform chart review.  o Patient seen for Office Visit with Physical Medicine and Rehabilitation on 4/19 for follow up of acute on chronic right lateral hip pain. Provider advised patient: - Increase gabapentin 100 mg to 3 times a day - Continue with exercises as learned in physical therapy at [facility] clinic  - Follow up with office as needed o Patient seen for Office Visit with [facility] Pulmonology on 4/18 for follow up. Provider advised patient: - Continue albuterol, singulair, symbicort 160/4.5 two puffs bid - Continue Pred 5 mg q o day x 10 days and d/c - Encouraged to stay on the same cpap settings  Medication Assistance . Confirms received supply of Farxiga 10 mg from patient assistance program.   Type 2 Diabetes . Uncontrolled based on latest A1C, but home CBG readings improved; current treatment: o metformin 500 mg - 2 tablets (1,000 mg) twice daily as directed o Farxiga 10 mg - 1 tablet QAM  o Note taking prednisone 5 mg decreased to once EVERY OTHER daily for 10 days, and then to discontinue as directed by pulmonologist on 4/18 . Review recent blood sugar results:  Fasting Blood Glucose After Supper  (~2 hours)  13 - April 121 181  14 - April 127 171  15 - April 154 201  16 - April 115 157  17 - April 145 245  18 - April 127 158  19 - April 125 198  20 - April 142   Average 132 187   . Reports has been limiting carbohydrate portion sizes and drinking less sweet tea and soda . Note patient's mobility limited by knee and back pain.    CHF/HTN . Current treatment:  Losartan 25 mg - 1/2 tablet (12.5 mg) once daily  Metoprolol 75 mg twice daily  Farxiga 10 mg - 1 tablets daily  Furosemide only as needed as directed by  Cardiologist . Review recent BP/HR results  AM BP PM BP  13 - April 129/87, HR 83 116/77, HR 87  14 - April 121/81, HR 79 120/76, HR 71  15 - April 135/87, HR 60 128/86, HR 74  16 - April 126/77, HR 67 128/84, HR 63  17 - April 140/92, HR 76 107/70, HR 68  18 - April 130/91, HR 63 130/89, HR 54  19 - April 132/88, HR 63 120/85, HR 75  20 - April  139/80, HR 71    . Denies any recent symptoms . Counsel patient on importance of continuing to weigh himself daily and follow directions from Cardiologist for use of PRN furosemide (Lasix).  o Reports has not needed furosemide recently. Reports weight staying consistently between 198-201 . Have counseled patient on importance of contacting Cardiologist for BP and HR readings outside of established parameters  Medication/Device Adherence . Note patient uses weekly pillbox to organize medications . Reports using maintenance (Symbicort) inhaler twice daily and rinsing mouth out after each use. Counsel on importance of continuing to use Symbicort consistently . Reports using rescue inhaler/nebulizer solution, and other respiratory medications as directed.  . Encourage patient to follow up with Pulmonlogist as needed, particularly for any new or worsening respiratory symptoms as follows provider's instruction for decreasing prednisone dose . Follow up with patient regarding sleep apnea/CPAP  Again counsel patient on rational for/encourage patient to use CPAP nightly  Have discussed strategies to improve control of allergy symptoms.   Also encouraged patient to contact DME company if needed for additional support/regarding moisture settings  Patient Goals/Self-Care Activities . Over the next 90 days, patient will:  . Self administers medications as prescribed . Attends all scheduled provider appointments ? Next appointment with Pulmonology on 10/18 . Calls pharmacy for medication refills . Calls provider office for new concerns or questions   . Patient to check blood sugar regularly as directed and keep log . Patient to keep log when checks blood pressure  Follow Up Plan: Telephone follow up appointment with care management team member scheduled for: 6/1 at 9:15 am     Medication Assistance: APPROVED for patient assistance for Farxiga from [employer] through 04/30/2021  Patient's preferred pharmacy is:  [facility] [address] ([address]), [place]) [place] [address] Phone: [phone] Fax: [phone]  Uses pill box? Yes  Follow Up:  Patient agrees to Care Plan and Follow-up.  Plan: Telephone follow up appointment with care management team member scheduled for:  6/1 at 9:15 am  [name], PharmD, [place], [place] [phone]

## 2020-08-18 NOTE — Patient Instructions (Signed)
Visit Information  PATIENT GOALS: Goals Addressed            This Visit's Progress   . Pharmacy Goals       Our goal A1c is less than 7%. This corresponds with fasting sugars less than 130 and 2 hour after meal sugars less than 180. Please check your blood sugar and keep log of results  Please continue to monitor your home weight, blood pressure and heart rate  Our goal bad cholesterol, or LDL, is less than 70 . This is why it is important to continue taking your atorvastatin   Feel free to call me with any questions or concerns. I look forward to our next call!    [name], PharmD, [place] [phone]        The patient verbalized understanding of instructions, educational materials, and care plan provided today and declined offer to receive copy of patient instructions, educational materials, and care plan.   Telephone follow up appointment with care management team member scheduled for: 6/1 at 9:15 am

## 2020-08-23 ENCOUNTER — Other Ambulatory Visit: Payer: Self-pay | Admitting: Physician Assistant

## 2020-08-24 NOTE — Telephone Encounter (Signed)
Rx request sent to pharmacy.  

## 2020-09-02 ENCOUNTER — Other Ambulatory Visit: Payer: Self-pay | Admitting: Cardiovascular Disease

## 2020-09-03 NOTE — Telephone Encounter (Signed)
24m, 93.4kg, scr 1.23 05/31/20, lovw/walker 05/31/20

## 2020-09-03 NOTE — Telephone Encounter (Signed)
Refill request

## 2020-09-03 NOTE — Telephone Encounter (Signed)
Pt's age 75, wt 90.7 kg, SCr 1.23, CrCl 68.62, last ov w/ CW 05/31/20.

## 2020-09-29 ENCOUNTER — Ambulatory Visit (INDEPENDENT_AMBULATORY_CARE_PROVIDER_SITE_OTHER): Payer: Medicare Other | Admitting: Pharmacist

## 2020-09-29 DIAGNOSIS — N182 Chronic kidney disease, stage 2 (mild): Secondary | ICD-10-CM

## 2020-09-29 DIAGNOSIS — J432 Centrilobular emphysema: Secondary | ICD-10-CM

## 2020-09-29 DIAGNOSIS — E785 Hyperlipidemia, unspecified: Secondary | ICD-10-CM | POA: Diagnosis not present

## 2020-09-29 DIAGNOSIS — I129 Hypertensive chronic kidney disease with stage 1 through stage 4 chronic kidney disease, or unspecified chronic kidney disease: Secondary | ICD-10-CM | POA: Diagnosis not present

## 2020-09-29 DIAGNOSIS — E1169 Type 2 diabetes mellitus with other specified complication: Secondary | ICD-10-CM | POA: Diagnosis not present

## 2020-09-29 NOTE — Patient Instructions (Signed)
Visit Information  PATIENT GOALS: Goals Addressed            This Visit's Progress   . Pharmacy Goals       Our goal A1c is less than 7%. This corresponds with fasting sugars less than 130 and 2 hour after meal sugars less than 180. Please check your blood sugar and keep log of results  Please continue to monitor your home weight, blood pressure and heart rate  Our goal bad cholesterol, or LDL, is less than 70 . This is why it is important to continue taking your atorvastatin   Feel free to call me with any questions or concerns. I look forward to our next call!    [name], PharmD, [place] [phone]        The patient verbalized understanding of instructions, educational materials, and care plan provided today and declined offer to receive copy of patient instructions, educational materials, and care plan.   Telephone follow up appointment with care management team member scheduled for: 7/1 at 9:15 am

## 2020-09-29 NOTE — Chronic Care Management (AMB) (Signed)
Chronic Care Management Pharmacy Note  09/29/2020 Name:  Philip LABRADOR Sr. MRN:  [id number] DOB:  02-Aug-1945   Subjective: Philip Potters Sr. is an 75 y.o. year old male who is a primary patient of [name], DO.  The CCM team was consulted for assistance with disease management and care coordination needs.    Engaged with patient by telephone for follow up visit in response to provider referral for pharmacy case management and/or care coordination services.   Consent to Services:  The patient was given information about Chronic Care Management services, agreed to services, and gave verbal consent prior to initiation of services.  Please see initial visit note for detailed documentation.   Patient Care Team: [name], DO as PCP - General (Family Medicine) [name], MD as PCP - Cardiology (Cardiology) [name], MD as Consulting Physician (Cardiology) Leanor Kail, MD (Inactive) (Orthopedic Surgery) Erby Pian, MD as Referring Physician (Specialist) Dhalla, Virl Diamond, RPH-CPP as Pharmacist Minor, Dalbert Garnet, RN (Inactive) as Case Manager  Recent office visits: None  Hospital visits: None in previous 6 months  Objective:  Lab Results  Component Value Date   CREATININE 1.23 05/31/2020   CREATININE 1.07 11/21/2019   CREATININE 1.09 08/26/2019    Lab Results  Component Value Date   HGBA1C 8.7 06/01/2020   Last diabetic Eye exam:  Lab Results  Component Value Date/Time   HMDIABEYEEXA No Retinopathy 07/08/2018 12:00 AM    Last diabetic Foot exam: No results found for: HMDIABFOOTEX      Component Value Date/Time   CHOL 145 11/21/2019 0915   CHOL 139 12/14/2014 0834   TRIG 99 11/21/2019 0915   HDL 67 11/21/2019 0915   HDL 43 12/14/2014 0834   CHOLHDL 2.2 11/21/2019 0915   VLDL 20 11/21/2019 0915   LDLCALC 58 11/21/2019 0915   LDLCALC 61 06/24/2018 0846    Hepatic Function Latest Ref Rng & Units  05/31/2020 11/21/2019 07/11/2019  Total Protein 6.0 - 8.5 g/dL 5.9(L) 6.6 -  Albumin 3.7 - 4.7 g/dL 4.2 4.1 3.1(A)  AST 0 - 40 IU/L _0 ALT 0 - 44 IU/L 14 21 8(A)  Alk Phosphatase 44 - 121 IU/L 87 60 -  Total Bilirubin 0.0 - 1.2 mg/dL 0.3 0.9 -  Bilirubin, Direct 0.00 - 0.40 mg/dL - - -    Lab Results  Component Value Date/Time   TSH 2.676 06/30/2019 04:48 AM   TSH 3.460 04/17/2019 09:09 AM   TSH 3.58 06/24/2018 08:46 AM   FREET4 1.4 06/24/2018 08:46 AM   FREET4 1.2 05/07/2017 08:08 AM    Social History   Tobacco Use  Smoking Status Former Smoker  . Packs/day: 1.00  . Years: 8.00  . Pack years: 8.00  . Types: Cigarettes  . Quit date: 12/31/1967  . Years since quitting: 52.7  Smokeless Tobacco Former User   BP Readings from Last 3 Encounters:  06/10/20 100/65  05/31/20 112/68  05/07/20 104/68   Pulse Readings from Last 3 Encounters:  06/10/20 85  05/31/20 67  05/07/20 83   Wt Readings from Last 3 Encounters:  08/03/20 200 lb (90.7 kg)  06/10/20 205 lb 12.8 oz (93.4 kg)  05/31/20 206 lb (93.4 kg)    Assessment: Review of patient past medical history, allergies, medications, health status, including review of consultants reports, laboratory and other test data, was performed as part of comprehensive evaluation and provision of chronic care management services.  SDOH:  (Social Determinants of Health) assessments and interventions performed: none   CCM Care Plan  Allergies  Allergen Reactions  . Bydureon [Exenatide] Anaphylaxis    Possible from bydureon, not 100% confirmed  . Clindamycin/Lincomycin Hives and Itching  . Penicillins Hives, Itching, Rash and Other (See Comments)    Has patient had a PCN reaction causing immediate rash, facial/tongue/throat swelling, SOB or lightheadedness with hypotension: Yes Has patient had a PCN reaction causing severe rash involving mucus membranes or skin necrosis: No Has patient had a PCN reaction that required  hospitalization: No Has patient had a PCN reaction occurring within the last 10 years: No If all of the above answers are "NO", then may proceed with Cephalosporin use.  . Sulfa Antibiotics Shortness Of Breath  . Spiriferis Other (See Comments)    Affected breathing  . Spiriva Handihaler [Tiotropium Bromide Monohydrate]     Affected breathing  . Ivp Dye [Iodinated Diagnostic Agents] Rash and Other (See Comments)    Redness    Medications Reviewed Today    Reviewed by Vella Raring, RPH-CPP (Pharmacist) on 09/29/20 at 1644  Med List Status: <None>  Medication Order Taking? Sig Documenting Provider Last Dose Status Informant  acetaminophen (TYLENOL) 500 MG tablet [id number]  Take 500 mg by mouth every 6 (six) hours as needed. [provider]  Active   Albuterol Sulfate 108 (90 Base) MCG/ACT AEPB [id number] Yes Inhale 2 puffs into the lungs every 6 (six) hours as needed (shortness of breath).  [provider] Taking Active Pharmacy Records  Artificial Tear Ointment (DRY EYES OP) [id number]  Place 1 drop into both eyes daily as needed (for dry eyes). [provider]  Active Pharmacy Records  atorvastatin (LIPITOR) 40 MG tablet [id number] Yes Take 1 tablet by mouth once daily [name], MD Taking Active   Blood Glucose Monitoring Suppl (ONE TOUCH ULTRA 2) w/Device KIT [id number]  Use to check blood sugar as advised up to twice a day [name], DO  Active Pharmacy Records  cetirizine (ZYRTEC) 10 MG tablet [id number] Yes Take 10 mg by mouth daily.  [provider] Taking Active Pharmacy Records  cyclobenzaprine (FLEXERIL) 10 MG tablet [id number] No Take 10 mg by mouth 3 (three) times daily.  Patient not taking: No sig reported   [provider] Not Taking Active   diclofenac sodium (VOLTAREN) 1 % GEL [id number] No Apply 2 g topically 3 (three) times daily as needed (Use on elbow bursitis and knee).  Patient not taking: No sig  reported   [name], DO Not Taking Active   ELIQUIS 5 MG TABS tablet [id number] Yes Take 1 tablet by mouth twice daily Loel Dubonnet, NP Taking Active   FARXIGA 10 MG TABS tablet [id number] Yes Take 1 tablet (10 mg total) by mouth daily before breakfast. [name], DO Taking Active   fluticasone (FLONASE) 50 MCG/ACT nasal spray [id number] No Place 1 spray into both nostrils daily as needed for allergies or rhinitis.  Patient not taking: Reported on 09/29/2020   [provider] Not Taking Active Pharmacy Records  furosemide (LASIX) 40 MG tablet [id number] Yes Take 20-40 mg by mouth daily as needed for edema. Only as needed now. [name], DO Taking Active   gabapentin (NEURONTIN) 100 MG capsule [id number] Yes TAKE 1 CAPSULE BY MOUTH THREE TIMES DAILY [name] [name], DO Taking Active   HYDROcodone-acetaminophen (NORCO/VICODIN) 5-325 MG tablet [id number] No Take 1 tablet by mouth every  4 (four) hours as needed for moderate pain or severe pain.  Patient not taking: No sig reported   [name], DO Not Taking Active   ipratropium-albuterol (DUONEB) 0.5-2.5 (3) MG/3ML SOLN [id number] Yes Take 3 mLs by nebulization 4 (four) times daily. [provider] Taking Active Pharmacy Records  [place] [id number]  1 Device by Does not apply route 4 (four) times daily -  before meals and at bedtime. [name], MD  Active Pharmacy Records  levothyroxine (SYNTHROID) 88 MCG tablet [id number] Yes TAKE 1 TABLET BY MOUTH ONCE DAILY BEFORE BREAKFAST [name], [name], DO Taking Active   losartan (COZAAR) 25 MG tablet [id number] Yes Take 1/2 (one-half) tablet by mouth once daily Loel Dubonnet, NP Taking Active   metFORMIN (GLUCOPHAGE) 500 MG tablet [id number] Yes TAKE 2 TABLETS BY MOUTH TWICE DAILY WITH A MEAL . APPOINTMENT REQUIRED FOR FUTURE REFILLS [name], DO Taking Active   metoprolol  tartrate (LOPRESSOR) 50 MG tablet [id number] Yes Take 1.5 tablets (75 mg total) by mouth 2 (two) times daily. Please call to schedule 6 month follow-up appointment for refills. Rise Mu, [name] Taking Active   montelukast (SINGULAIR) 10 MG tablet [id number] Yes Take 10 mg by mouth at bedtime. [provider] Taking Active   ONETOUCH ULTRA test strip [id number]  USE TEST STRIPS TO CHECK GLUCOSE TWICE DAILY. [name], [name], DO  Active   predniSONE (DELTASONE) 5 MG tablet [id number] Yes Take 5 mg by mouth every other day. Reports currently taking every other day with plan to continue for 10 days and then stop per Dr. Raul Del [provider] Taking Active            Med Note [facility]   Wed Sep 29, 2020  1:58 PM) Taking 5 mg daily  SYMBICORT 160-4.5 MCG/ACT inhaler [id number] Yes Inhale 2 puffs 2 (two) times daily into the lungs.  [provider] Taking Active Pharmacy Records          Patient Active Problem List   Diagnosis Date Noted  . Chronic right hip pain 11/19/2019  . Thrush   . MSSA bacteremia   . Chronic systolic CHF (congestive heart failure) ([place])   . AF (paroxysmal atrial fibrillation) ([place])   . HFrEF (heart failure with reduced ejection fraction) ([place])   . Community acquired pneumonia of left lower lobe of lung   . COPD with acute exacerbation ([place]) 06/26/2019  . Overweight (BMI 25.0-29.9) 07/01/2018  . Osteoarthritis of multiple joints 05/14/2017  . Chronic pain of left knee 05/14/2017  . OSA (obstructive sleep apnea) 02/28/2017  . CKD (chronic kidney disease), stage II 02/28/2017  . Chronic anticoagulation 02/28/2017  . Onychomycosis of left great toe 08/14/2016  . Allergic rhinitis due to allergen 07/31/2016  . Peripheral neuropathy 08/17/2015  . Type 2 diabetes mellitus with hyperlipidemia ([place]) 08/17/2015  . (HFpEF) heart failure with preserved ejection fraction ([place]) 07/25/2015  . Atrial fibrillation with rapid ventricular  response ([place]) 12/10/2014  . Centrilobular emphysema ([place]) 11/09/2014  . Bradycardia 11/09/2014  . 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 Benign hypertension with CKD (chronic kidney disease), stage II 11/09/2014  . Bursitis of hip 11/09/2014  . Enlarged prostate 11/09/2014  . Coronary artery disease   . 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 Hypothyroid 05/06/2012  . Hyperlipidemia associated with type 2 diabetes mellitus ([place]) 03/31/2012  . Colon polyp 12/21/2003    Immunization History  Administered Date(s) Administered  .  Fluad Quad(high Dose 65+) 12/31/2018  . Influenza, High Dose Seasonal PF 01/06/2015, 02/28/2017  . Influenza-Unspecified 02/08/2014, 03/01/2018  . Pneumococcal Conjugate-13 01/02/2014  . Pneumococcal Polysaccharide-23 05/01/2012  . Tdap 05/01/2012    Conditions to be addressed/monitored: Atrial Fibrillation, HTN, HLD, COPD and DMII  Care Plan : PharmD - Medication Management/Medication Assistance  Updates made by Vella Raring, RPH-CPP since 09/29/2020 12:00 AM    Problem: Disease Progression     Long-Range Goal: Disease Progression Prevented or Minimized   Start Date: 06/16/2020  Expected End Date: 09/14/2020  This Visit's Progress: On track  Recent Progress: On track  Priority: High  Note:   Current Barriers:  [name] Knowledge deficits related to dietary choices to improve blood sugar control . Financial - difficulty with affording Farxiga through Medicare Part D health plan . Patient APPROVED for patient assistance for Farxiga from [place] through 04/30/2021  Pharmacist Clinical Goal(s):  [name] Over the next 90 days, patient will achieve control of T2DM as evidenced by A1C through collaboration with PharmD and provider.   Interventions: . 1:1 collaboration with [name], DO regarding development and update of comprehensive plan of care as evidenced by provider attestation and co-signature . Inter-disciplinary care team collaboration (see  longitudinal plan of care)  COPD: . Patient [facility] with [facility]ul [facility] . Current treatment: o Symbicort 160/4.5 two puffs bid o Montelukast 10 mg QHS o Albuterol/ipratropium nebulizer solution/ albuterol inhaler as needed o Flonase nasal spray as needed o Reports self-restarted prednisone 5 mg daily ~2 weeks ago due to respiratory symptoms - Note previously on prednisone 5 mg daily. At latest office visit on 4/18, pulmonologist advised patient on tapering down and then discontinuing prednisone . Today reports that over the past weeks, he has had difficulty with shortness of breath, particularly if outside when weather hot and humid o Reports also noticed had less relief from his nebulizer solution from a different manufacturer, but reports with latest refill is back on previous manufacturer and better response o Reports has thick, grey productive cough sometimes o Reports that while he is more breathless than usual, is able to catch his breath o States sleeping okay with head of his bed raised o Reports breathing has improved over past 2 weeks. o Encourage patient to contact Pulmonologist office today to let provider know about his latest symptoms and that he self-restarted the prednisone - Patient confirms having phone number and states will call provider . Reports using maintenance (Symbicort) inhaler twice daily and rinsing mouth out after each use. Counsel on importance of continuing to use Symbicort consistently o Identify patient in need of refill of Symbicort. Patient confirms will call pharmacy to order.  Sleep Apnea: . Follow up with patient regarding sleep apnea/CPAP o Counseled patient on rational for/encourage patient to use CPAP nightly o Have discussed strategies to improve control of allergy symptoms.  o Encourage patient to contact DME company/Pulmonologist if needed for additional support/regarding moisture settings  Type 2  Diabetes . Uncontrolled based on latest A1C, but home CBG readings improved; current treatment: o metformin 500 mg - 2 tablets (1,000 mg) twice daily as directed o Farxiga 10 mg - 1 tablet QAM  o Note recently taking prednisone 5 mg daily  . Review recent blood sugar results:  Fasting Blood Glucose After Supper  (~2 hours)  25 - May 137 223  26 - May 113 222  27 - May 127 276  28 - May 146 295  28 - May 154 171  30 - May 130 175  31 - May - -  1 - June 146   Average 136 227   . Attributes recent elevated evening readings to having sweets after meals o Discuss limiting carbohydrate portions sizes o Reports drinking less soda and using less sugar when making sweet tea, but reports also drinks [place] lemonade - Counsel on lower-carbohydrate beverage options . Note patient's mobility limited by knee and back pain.    CHF/HTN . Current treatment:  Losartan 25 mg - 1/2 tablet (12.5 mg) once daily  Metoprolol 75 mg twice daily  Farxiga 10 mg - 1 tablets daily  Furosemide 40 mg daily as needed as directed by Cardiologist . Reports recent BP ranging: 110-130s/70s-80s . Denies any recent symptoms . Confirms has been weighing himself daily and following directions from Cardiologist for use of PRN furosemide (Lasix).  . Have counseled patient on importance of contacting Cardiologist for BP and HR readings outside of established parameters  Medication/Device Adherence . Note patient uses weekly pillbox to organize medications .   Patient Goals/Self-Care Activities . Over the next 90 days, patient will:  . Self administers medications as prescribed . Attends all scheduled provider appointments ? Next appointment with Pulmonology on 10/18 . Calls pharmacy for medication refills . Calls provider office for new concerns or questions  . Patient to check blood sugar regularly as directed and keep log . Patient to keep log when checks blood pressure  Follow Up Plan: Telephone  follow up appointment with care management team member scheduled for: 7/1 at 9:15 am      Medication Assistance: Patient APPROVED for patient assistance for Farxiga from [employer] through 04/30/2021  Patient's preferred pharmacy is:  [facility] [address]), [place]) [place] [address] Phone: [phone] Fax: [phone]  Uses pill box? Yes  Follow Up:  Patient agrees to Care Plan and Follow-up.  [name], PharmD, [place], CPP Clinical Pharmacist [facility] [phone]

## 2020-09-30 ENCOUNTER — Other Ambulatory Visit: Payer: Self-pay | Admitting: Family Medicine

## 2020-09-30 DIAGNOSIS — E034 Atrophy of thyroid (acquired): Secondary | ICD-10-CM

## 2020-09-30 NOTE — Telephone Encounter (Signed)
Requested Prescriptions  Pending Prescriptions Disp Refills  . levothyroxine (SYNTHROID) 88 MCG tablet [Pharmacy Med Name: Levothyroxine Sodium 88 MCG Oral Tablet] 90 tablet 0    Sig: TAKE 1 TABLET BY MOUTH ONCE DAILY BEFORE BREAKFAST     Endocrinology:  Hypothyroid Agents Failed - 09/30/2020 11:28 AM      Failed - TSH needs to be rechecked within 3 months after an abnormal result. Refill until TSH is due.      Failed - TSH in normal range and within 360 days    TSH  Date Value Ref Range Status  06/30/2019 2.676 0.350 - 4.500 uIU/mL Final    Comment:    Performed by a 3rd Generation assay with a functional sensitivity of <=0.01 uIU/mL. Performed at [facility] [address] [place], [place] [address]   04/17/2019 3.460 0.450 - 4.500 uIU/mL Final         Passed - Valid encounter within last 12 months    Recent Outpatient Visits          3 months ago Type 2 diabetes mellitus with hyperlipidemia [facility])   [facility], [name], DO   4 months ago Acute idiopathic gout of right ankle   [place], DO   9 months ago Acute conjunctivitis of both eyes, unspecified acute conjunctivitis type   [place], DO   10 months ago Chronic right hip pain   [place], DO   1 year ago Acute idiopathic gout of right foot   [facility] [name], DO      Future Appointments            In 10 months [facility], [facility]

## 2020-10-12 ENCOUNTER — Other Ambulatory Visit: Payer: Self-pay | Admitting: Cardiovascular Disease

## 2020-10-12 ENCOUNTER — Other Ambulatory Visit: Payer: Self-pay | Admitting: Family

## 2020-10-28 ENCOUNTER — Other Ambulatory Visit: Payer: Self-pay | Admitting: Physician Assistant

## 2020-10-28 DIAGNOSIS — I5020 Unspecified systolic (congestive) heart failure: Secondary | ICD-10-CM

## 2020-10-28 NOTE — Telephone Encounter (Signed)
Pt due for 6 month f/u.  Please contact pt for future appointment. 

## 2020-10-29 ENCOUNTER — Ambulatory Visit (INDEPENDENT_AMBULATORY_CARE_PROVIDER_SITE_OTHER): Payer: Medicare Other | Admitting: Pharmacist

## 2020-10-29 DIAGNOSIS — E1169 Type 2 diabetes mellitus with other specified complication: Secondary | ICD-10-CM | POA: Diagnosis not present

## 2020-10-29 DIAGNOSIS — E785 Hyperlipidemia, unspecified: Secondary | ICD-10-CM

## 2020-10-29 DIAGNOSIS — N182 Chronic kidney disease, stage 2 (mild): Secondary | ICD-10-CM | POA: Diagnosis not present

## 2020-10-29 DIAGNOSIS — J432 Centrilobular emphysema: Secondary | ICD-10-CM

## 2020-10-29 DIAGNOSIS — I129 Hypertensive chronic kidney disease with stage 1 through stage 4 chronic kidney disease, or unspecified chronic kidney disease: Secondary | ICD-10-CM | POA: Diagnosis not present

## 2020-10-29 NOTE — Patient Instructions (Signed)
Visit Information  PATIENT GOALS:  Goals Addressed             This Visit's Progress    Pharmacy Goals       Our goal A1c is less than 7%. This corresponds with fasting sugars less than 130 and 2 hour after meal sugars less than 180. Please check your blood sugar and keep log of results  Please continue to monitor your home weight, blood pressure and heart rate  Our goal bad cholesterol, or LDL, is less than 70 . This is why it is important to continue taking your atorvastatin   Feel free to call me with any questions or concerns. I look forward to our next call!   [name], PharmD, [place] [phone]          The patient verbalized understanding of instructions, educational materials, and care plan provided today and declined offer to receive copy of patient instructions, educational materials, and care plan.   Telephone follow up appointment with care management team member scheduled for: 12/08/2020 at 9:30 AM

## 2020-10-29 NOTE — Telephone Encounter (Signed)
Please advise if ok to refill Furosemide 40 mg qd. Last filled by PCP. Pt has future appointment scheduled w/[place].

## 2020-10-29 NOTE — Chronic Care Management (AMB) (Signed)
Chronic Care Management Pharmacy Note  10/29/2020 Name:  Philip REPPOND Sr. MRN:  [id number] DOB:  05-02-1945   Subjective: Philip Potters Sr. is an 75 y.o. year old male who is a primary patient of [name], DO.  The CCM team was consulted for assistance with disease management and care coordination needs.    Engaged with patient by telephone for follow up visit in response to provider referral for pharmacy case management and/or care coordination services.   Consent to Services:  The patient was given information about Chronic Care Management services, agreed to services, and gave verbal consent prior to initiation of services.  Please see initial visit note for detailed documentation.   Patient Care Team: [name], DO as PCP - General (Family Medicine) [name], MD as PCP - Cardiology (Cardiology) [name], MD as Consulting Physician (Cardiology) Leanor Kail, MD (Inactive) (Orthopedic Surgery) Erby Pian, MD as Referring Physician (Specialist) Margaret Staggs, Virl Diamond, RPH-CPP as Pharmacist Minor, Dalbert Garnet, RN (Inactive) as Case Manager  Recent office visits: None  Hospital visits: None in previous 6 months  Objective:  Lab Results  Component Value Date   CREATININE 1.23 05/31/2020   CREATININE 1.07 11/21/2019   CREATININE 1.09 08/26/2019    Lab Results  Component Value Date   HGBA1C 8.7 06/01/2020   Last diabetic Eye exam:  Lab Results  Component Value Date/Time   HMDIABEYEEXA No Retinopathy 07/08/2018 12:00 AM    Last diabetic Foot exam: No results found for: HMDIABFOOTEX      Component Value Date/Time   CHOL 145 11/21/2019 0915   CHOL 139 12/14/2014 0834   TRIG 99 11/21/2019 0915   HDL 67 11/21/2019 0915   HDL 43 12/14/2014 0834   CHOLHDL 2.2 11/21/2019 0915   VLDL 20 11/21/2019 0915   LDLCALC 58 11/21/2019 0915   LDLCALC 61 06/24/2018 0846    Hepatic Function Latest Ref Rng & Units  05/31/2020 11/21/2019 07/11/2019  Total Protein 6.0 - 8.5 g/dL 5.9(L) 6.6 -  Albumin 3.7 - 4.7 g/dL 4.2 4.1 3.1(A)  AST 0 - 40 IU/L 11 15 19   ALT 0 - 44 IU/L 14 21 8(A)  Alk Phosphatase 44 - 121 IU/L 87 60 -  Total Bilirubin 0.0 - 1.2 mg/dL 0.3 0.9 -  Bilirubin, Direct 0.00 - 0.40 mg/dL - - -    Lab Results  Component Value Date/Time   TSH 2.676 06/30/2019 04:48 AM   TSH 3.460 04/17/2019 09:09 AM   TSH 3.58 06/24/2018 08:46 AM   FREET4 1.4 06/24/2018 08:46 AM   FREET4 1.2 05/07/2017 08:08 AM    Social History   Tobacco Use  Smoking Status Former   Packs/day: 1.00   Years: 8.00   Pack years: 8.00   Types: Cigarettes   Quit date: 12/31/1967   Years since quitting: 52.8  Smokeless Tobacco Former   BP Readings from Last 3 Encounters:  06/10/20 100/65  05/31/20 112/68  05/07/20 104/68   Pulse Readings from Last 3 Encounters:  06/10/20 85  05/31/20 67  05/07/20 83   Wt Readings from Last 3 Encounters:  08/03/20 200 lb (90.7 kg)  06/10/20 205 lb 12.8 oz (93.4 kg)  05/31/20 206 lb (93.4 kg)    Assessment: Review of patient past medical history, allergies, medications, health status, including review of consultants reports, laboratory and other test data, was performed as part of comprehensive evaluation and provision of chronic care management services.  SDOH:  (Social Determinants of Health) assessments and interventions performed: none   CCM Care Plan  Allergies  Allergen Reactions   Bydureon [Exenatide] Anaphylaxis    Possible from bydureon, not 100% confirmed   Clindamycin/Lincomycin Hives and Itching   Penicillins Hives, Itching, Rash and Other (See Comments)    Has patient had a PCN reaction causing immediate rash, facial/tongue/throat swelling, SOB or lightheadedness with hypotension: Yes Has patient had a PCN reaction causing severe rash involving mucus membranes or skin necrosis: No Has patient had a PCN reaction that required hospitalization: No Has  patient had a PCN reaction occurring within the last 10 years: No If all of the above answers are "NO", then may proceed with Cephalosporin use.   Sulfa Antibiotics Shortness Of Breath   Spiriferis Other (See Comments)    Affected breathing   Spiriva Handihaler [Tiotropium Bromide Monohydrate]     Affected breathing   Ivp Dye [Iodinated Diagnostic Agents] Rash and Other (See Comments)    Redness    Medications Reviewed Today     Reviewed by Vella Raring, RPH-CPP (Pharmacist) on 10/29/20 at 1042  Med List Status: <None>   Medication Order Taking? Sig Documenting Provider Last Dose Status Informant  acetaminophen (TYLENOL) 500 MG tablet [id number]  Take 500 mg by mouth every 6 (six) hours as needed. [provider]  Active   Albuterol Sulfate 108 (90 Base) MCG/ACT AEPB [id number] Yes Inhale 2 puffs into the lungs every 6 (six) hours as needed (shortness of breath).  [provider] Taking Active Pharmacy Records  Artificial Tear Ointment (DRY EYES OP) [id number]  Place 1 drop into both eyes daily as needed (for dry eyes). [provider]  Active Pharmacy Records  atorvastatin (LIPITOR) 40 MG tablet [id number]  Take 1 tablet (40 mg total) by mouth daily. PLEASE SCHEDULE OFFICE VISIT FOR FURTHER REFILLS. THANK YOU! [name], MD  Active   Blood Glucose Monitoring Suppl (ONE TOUCH ULTRA 2) w/Device KIT [id number]  Use to check blood sugar as advised up to twice a day [name], DO  Active Pharmacy Records  cetirizine (ZYRTEC) 10 MG tablet [id number] Yes Take 10 mg by mouth daily.  [provider] Taking Active Pharmacy Records  cyclobenzaprine (FLEXERIL) 10 MG tablet [id number]  Take 10 mg by mouth 3 (three) times daily.  Patient not taking: No sig reported   [provider]  Active   diclofenac sodium (VOLTAREN) 1 % GEL [id number]  Apply 2 g topically 3 (three) times daily as needed (Use on elbow bursitis and knee).  Patient not  taking: No sig reported   [name], DO  Active   ELIQUIS 5 MG TABS tablet [id number] Yes Take 1 tablet by mouth twice daily Loel Dubonnet, NP Taking Active   FARXIGA 10 MG TABS tablet [id number] Yes Take 1 tablet (10 mg total) by mouth daily before breakfast. [name], DO Taking Active   fluticasone (FLONASE) 50 MCG/ACT nasal spray [id number] Yes Place 1 spray into both nostrils daily as needed for allergies or rhinitis. [provider] Taking Active   furosemide (LASIX) 40 MG tablet [id number] Yes Take 20-40 mg by mouth daily as needed for edema. Only as needed now. [name], DO Taking Active   gabapentin (NEURONTIN) 100 MG capsule [id number]  TAKE 1 CAPSULE BY MOUTH THREE TIMES DAILY [name], DO  Active   HYDROcodone-acetaminophen (NORCO/VICODIN) 5-325 MG tablet [id number]  Take 1 tablet by mouth every  4 (four) hours as needed for moderate pain or severe pain.  Patient not taking: No sig reported   [name], DO  Active   ipratropium-albuterol (DUONEB) 0.5-2.5 (3) MG/3ML SOLN [id number] Yes Take 3 mLs by nebulization 4 (four) times daily. [provider] Taking Active Pharmacy Records  [place] [id number]  1 Device by Does not apply route 4 (four) times daily -  before meals and at bedtime. [name], MD  Active Pharmacy Records  levothyroxine (SYNTHROID) 88 MCG tablet [id number]  TAKE 1 TABLET BY MOUTH ONCE DAILY BEFORE BREAKFAST [name], Alexander Lenna Sciara, DO  Active   losartan (COZAAR) 25 MG tablet [id number] Yes Take 0.5 tablets (12.5 mg total) by mouth daily. PLEASE SCHEDULE OFFICE VISIT FOR FURTHER REFILLS. THANK YOU! [name], MD Taking Active   metFORMIN (GLUCOPHAGE) 500 MG tablet [id number] Yes TAKE 2 TABLETS BY MOUTH TWICE DAILY WITH A MEAL . APPOINTMENT REQUIRED FOR FUTURE REFILLS [name], DO Taking Active   metoprolol tartrate (LOPRESSOR) 50 MG tablet  [id number] Yes Take 1.5 tablets (75 mg total) by mouth 2 (two) times daily. Please call to schedule 6 month follow-up appointment for refills. Rise Mu, [name] Taking Active   montelukast (SINGULAIR) 10 MG tablet [id number] Yes Take 10 mg by mouth at bedtime. [provider] Taking Active   ONETOUCH ULTRA test strip [id number]  USE TEST STRIPS TO CHECK GLUCOSE TWICE DAILY. [name], [name], DO  Active   predniSONE (DELTASONE) 5 MG tablet [id number] No Take 5 mg by mouth every other day. Reports currently taking every other day with plan to continue for 10 days and then stop per Dr. Raul Del  Patient not taking: Reported on 10/29/2020   [provider] Not Taking Active            Med Note Winfield Cunas, Shekera Beavers A   Wed Sep 29, 2020  1:58 PM) Taking 5 mg daily  SYMBICORT 160-4.5 MCG/ACT inhaler [id number] Yes Inhale 2 puffs 2 (two) times daily into the lungs.  [provider] Taking Active Pharmacy Records            Patient Active Problem List   Diagnosis Date Noted   Chronic right hip pain 11/19/2019   Thrush    MSSA bacteremia    Chronic systolic CHF (congestive heart failure) (HCC)    AF (paroxysmal atrial fibrillation) (HCC)    HFrEF (heart failure with reduced ejection fraction) ([place])    Community acquired pneumonia of left lower lobe of lung    COPD with acute exacerbation ([place]) 06/26/2019   Overweight (BMI 25.0-29.9) 07/01/2018   Osteoarthritis of multiple joints 05/14/2017   Chronic pain of left knee 05/14/2017   OSA (obstructive sleep apnea) 02/28/2017   CKD (chronic kidney disease), stage II 02/28/2017   Chronic anticoagulation 02/28/2017   Onychomycosis of left great toe 08/14/2016   Allergic rhinitis due to allergen 07/31/2016   Peripheral neuropathy 08/17/2015   Type 2 diabetes mellitus with hyperlipidemia ([place]) 08/17/2015   (HFpEF) heart failure with preserved ejection fraction ([place]) 07/25/2015   Atrial fibrillation with rapid ventricular  response ([place]) 12/10/2014   Centrilobular emphysema ([place]) 11/09/2014   Bradycardia 11/09/2014   Cardiac conduction disorder 11/09/2014   Benign hypertension with CKD (chronic kidney disease), stage II 11/09/2014   Bursitis of hip 11/09/2014   Enlarged prostate 11/09/2014   Coronary artery disease    S/P CABG x 3 05/12/2012   Hypothyroid 05/06/2012   Hyperlipidemia associated with  type 2 diabetes mellitus ([place]) 03/31/2012   Colon polyp 12/21/2003    Immunization History  Administered Date(s) Administered   Fluad Quad(high Dose 65+) 12/31/2018   Influenza, High Dose Seasonal PF 01/06/2015, 02/28/2017   Influenza-Unspecified 02/08/2014, 03/01/2018   Pneumococcal Conjugate-13 01/02/2014   Pneumococcal Polysaccharide-23 05/01/2012   Tdap 05/01/2012    Conditions to be addressed/monitored: Atrial Fibrillation, HTN, HLD, COPD, hypothyroidism and DMII  Care Plan : PharmD - Medication Management/Medication Assistance  Updates made by Vella Raring, RPH-CPP since 10/29/2020 12:00 AM     Problem: Disease Progression      Long-Range Goal: Disease Progression Prevented or Minimized   Start Date: 06/16/2020  Expected End Date: 09/14/2020  This Visit's Progress: On track  Recent Progress: On track  Priority: High  Note:   Current Barriers:  Knowledge deficits related to dietary choices to improve blood sugar control Financial - difficulty with affording [name] through Medicare Part D health plan Patient APPROVED for patient assistance for Farxiga from [employer] through 04/30/2021  Pharmacist Clinical Goal(s):  Over the next 90 days, patient will achieve control of T2DM as evidenced by A1C through collaboration with PharmD and provider.   Interventions: 1:1 collaboration with [name], DO regarding development and update of comprehensive plan of care as evidenced by provider attestation and co-signature Inter-disciplinary care team collaboration (see longitudinal  plan of care)  COPD/seasonal allergies: Patient follows with [facility] Raul Del at [facility] Pulmonology Current treatment: Symbicort 160/4.5 two puffs bid Montelukast 10 mg QHS Albuterol/ipratropium nebulizer solution/ albuterol inhaler as needed Cetirizine 10 mg daily Flonase nasal spray as needed Reports respiratory symptoms have improved and no longer taking prednisone for ~1 week. Note when we previously spoke, patient reported had self-restarted taking prednisone 5 mg daily when having increased shortness of breath and mucus. Note previously on prednisone 5 mg daily. At latest office visit on 4/18, pulmonologist advised patient on tapering down and then discontinuing prednisone Denies having followed up with Pulmonologist regarding respiratory symptoms Reports feels like reducing dairy consumption (ice cream) and avoiding being outdoors during higher humidity have helped Again encourage patient to follow up with Pulmonologist for further guidance around managing [place] symptoms for COPD action plan Offer to contact Pulmonologist with patient today, but he declines, stating he will follow up Reports using maintenance (Symbicort) inhaler twice daily and rinsing mouth out after each use. Counsel on importance of continuing to use Symbicort consistently  Sleep Apnea: Follow up with patient regarding sleep apnea/CPAP Counseled patient on rational for/encourage patient to use CPAP nightly Have discussed strategies to improve control of allergy symptoms.  Encourage patient to contact DME company/Pulmonologist if needed for additional support/regarding moisture settings  Type 2 Diabetes Uncontrolled based on latest A1C, but home CBG readings improved; current treatment: metformin 500 mg - 2 tablets (1,000 mg) twice daily as directed Farxiga 10 mg - 1 tablet QAM   Review recent blood sugar results:  Fasting Blood Glucose After Supper (~2 hours) Comments  22 - June 123 183   23 - June  137 188   24 - June 137 195   25 - June 140 161   26 - June 144 168   27 - June 140 182   28 - June 136 180   29 - June 144 196   30 - June 162 182   1 - July 204*  *Ran out of metformin yesterday   Attributes elevated blood sugar reading this morning to running out of  metformin (missed yesterday's doses). Confirms will pick up refill and resume taking Reports has been making changes to dietary habits to reduce carbohydrate consumption: Cutting back on snacking, soft drinks and less sugar in his tea Changing snacks to fresh cucumbers and onions Note patient's mobility limited by knee and back pain.    CHF/HTN Current treatment: Losartan 25 mg - 1/2 tablet (12.5 mg) once daily Metoprolol 75 mg twice daily Farxiga 10 mg - 1 tablets daily Furosemide 40 mg daily as needed as directed by Cardiologist Reports recent BP results:  AM BP PM BP  25 - June 129/84, HR 81 116/75, HR 75  26 - June 124/71, HR 78 120/82, HR 76  27 - June 123/88, HR 92 128/84, HR 78  28 - June 123/91, HR 86 140/86, HR 80  29 - June 131/88, HR 79 112/80, HR 84  30 - June 129/81, HR 68 136/95, HR 79  1 - July 128/87, HR 79    Attributes higher HR readings to times when checked too soon after walking with his dog Denies any recent symptoms Confirms has been weighing himself daily and following directions from Cardiologist for use of PRN furosemide (Lasix).  Have counseled patient on importance of contacting Cardiologist for BP and HR readings outside of established parameters  Medication/Device Adherence Note patient uses weekly pillbox to organize medications  Medication Assistance: Patient currently receiving patient assistance for Farxiga from [employer] through 04/30/2021 Reports difficulty with affording cost of Eliquis, particularly as concerned he is close to being in the coverage gap of his Part D plan Based on reported income, patient meets income requirement of program. Place call to [employer] for  out of pocket total for calendar year for husband and spouse. Patient will calculate current household income to determine 3% out of pocket requirement for 2022 calendar year. Will collaborate with Martin Simcox for assistance to patient with applying for patient assistance for Eliquis  Patient Goals/Self-Care Activities Over the next 90 days, patient will:  Self administers medications as prescribed Attends all scheduled provider appointments Next appointment with Cardiology on 8/3 Next appointment with Pulmonology on 10/18 Calls pharmacy for medication refills Calls provider office for new concerns or questions  Patient to check blood sugar regularly as directed and keep log Patient to keep log when checks blood pressure  Follow Up Plan: Telephone follow up appointment with care management team member scheduled for: 12/08/2020 at 9:30 AM      Patient's preferred pharmacy is:  [facility] [address] ([address]), [place]) [place] [address] Phone: [phone] Fax: [phone]  Uses pill box? Yes  Follow Up:  Patient agrees to Care Plan and Follow-up.  [name], PharmD, Para [place], CPP Clinical Pharmacist [facility] [phone]

## 2020-11-02 NOTE — Telephone Encounter (Signed)
Would forward to PCP office for refill as I do not see where cardiology changed the sig to what this is currently reading.   Thank you!

## 2020-11-02 NOTE — Telephone Encounter (Signed)
Please see note below. 

## 2020-11-03 MED ORDER — FUROSEMIDE 40 MG PO TABS: 20.0000 mg | ORAL_TABLET | Freq: Every day | ORAL | 3 refills | Status: DC | PRN

## 2020-11-03 NOTE — Telephone Encounter (Signed)
Please review for refill.  

## 2020-11-05 ENCOUNTER — Other Ambulatory Visit: Payer: Self-pay | Admitting: Family Medicine

## 2020-11-05 DIAGNOSIS — E1142 Type 2 diabetes mellitus with diabetic polyneuropathy: Secondary | ICD-10-CM

## 2020-11-05 MED ORDER — GABAPENTIN 100 MG PO CAPS: 100.0000 mg | ORAL_CAPSULE | Freq: Three times a day (TID) | ORAL | 0 refills | Status: DC

## 2020-11-05 NOTE — Telephone Encounter (Signed)
Medication Refill - Medication:   gabapentin (NEURONTIN) 100 MG capsule  Has the patient contacted their pharmacy? Yes.  Pt stated he contact the pharmacy and only has a few pills left.    Preferred Pharmacy (with phone number or street name):   [facility] [place] [address]  Phone: [phone] Fax: [phone]    Agent: Please be advised that RX refills may take up to 3 business days. We ask that you follow-up with your pharmacy.

## 2020-11-09 ENCOUNTER — Telehealth: Payer: Self-pay | Admitting: Pharmacy Technician

## 2020-11-09 DIAGNOSIS — Z596 Low income: Secondary | ICD-10-CM

## 2020-11-09 NOTE — Progress Notes (Signed)
[facility])                                            [place] Team    11/09/2020  ARUL FARABEE Sr. December 15, 1945 [id number]                                       Medication Assistance Referral  Referral From: [facility] Embedded RPh Dorthula Perfect   Medication/Company: Eliquis / BMS Patient application portion:  Mailed Provider application portion: Faxed  to Laurann Montana, NP Provider address/fax verified via: Office website   Nivek Powley P. Whitman Meinhardt, [place]  [phone]

## 2020-11-19 ENCOUNTER — Other Ambulatory Visit: Payer: Self-pay | Admitting: Physician Assistant

## 2020-11-22 DIAGNOSIS — M5416 Radiculopathy, lumbar region: Secondary | ICD-10-CM | POA: Diagnosis not present

## 2020-11-22 DIAGNOSIS — M5136 Other intervertebral disc degeneration, lumbar region: Secondary | ICD-10-CM | POA: Diagnosis not present

## 2020-11-22 DIAGNOSIS — M5126 Other intervertebral disc displacement, lumbar region: Secondary | ICD-10-CM | POA: Diagnosis not present

## 2020-11-29 ENCOUNTER — Other Ambulatory Visit: Payer: Self-pay | Admitting: Family Medicine

## 2020-11-29 DIAGNOSIS — E1142 Type 2 diabetes mellitus with diabetic polyneuropathy: Secondary | ICD-10-CM

## 2020-11-29 NOTE — Telephone Encounter (Signed)
Requested Prescriptions  Pending Prescriptions Disp Refills  . ONETOUCH ULTRA test strip [employer] Med Name: OneTouch Ultra Blue In Vitro Strip] 150 each 0    Sig: USE STRIP TO CHECK GLUCOSE TWICE DAILY     Endocrinology: Diabetes - Testing Supplies Passed - 11/29/2020 11:35 AM      Passed - Valid encounter within last 12 months    Recent Outpatient Visits          5 months ago Type 2 diabetes mellitus with hyperlipidemia [facility])   [facility] [place], [name], DO   6 months ago Acute idiopathic gout of right ankle   [place], DO   11 months ago Acute conjunctivitis of both eyes, unspecified acute conjunctivitis type   [place], DO   1 year ago Chronic right hip pain   [place], DO   1 year ago Acute idiopathic gout of right foot   [facility], [name], DO      Future Appointments            In 2 days [place], [name], [name] [place], [place]   In 8 months  [facility], [place]

## 2020-11-30 ENCOUNTER — Other Ambulatory Visit: Payer: Self-pay | Admitting: Family Medicine

## 2020-11-30 DIAGNOSIS — M5416 Radiculopathy, lumbar region: Secondary | ICD-10-CM

## 2020-11-30 DIAGNOSIS — M5136 Other intervertebral disc degeneration, lumbar region: Secondary | ICD-10-CM

## 2020-11-30 NOTE — Progress Notes (Signed)
Cardiology Office Note:    Date:  12/01/2020   ID:  Philip Potters Sr., DOB 19-May-1945, MRN [id number]  PCP:  Philip Hauser, DO  [facility] Cardiologist:  Philip Sacramento, MD  [facility] Electrophysiologist:  None   Referring MD: Philip [name] *   Chief Complaint: 6 month follow-up  History of Present Illness:    Philip [name]. is a 75 y.o. male with a hx of CAD s/p CABGx3 05/2012 with LIMA to LAD, SVG to RPDA, HFrEF, persistent atrial fibrillation on Eliquis, ectopic tachycardia, diastolic dysfunction, DM2, HLD, HTN who presents for CAD follow-up.   Cardiac cath 05/2012 showed severe 3V CAD recommended for CABG. Myoview 06/2014 showed evidence of ischemia. Echo 06/2015 with LVEF 5-55%, G1DD, mild MR, mildly dilated LA, normal RV systolic function, normal PASP.   H/o of afb on Eliquis. Previously on amiodarone but this was discontinued 11/2016 secondary concern for related side effects. He has not been interested in catheter ablation.   Admitted 06/2019 with sepsis due to PNA and MSSA bacteremia complicated by aflutter with RVR. 06/2019 showed LVEF 40-45%, global HK, moderately dilated LV, mildly reduced RV function with mildly larged RV size, moderately elevated PASP, mild biatrial enlargement, mild to mod MR. TEE 06/2019 showed LVEF 30-35%, no appendage thrombus detected, mild tom mod MR.   Seen by cardiology for hospitla follow-up 06/2019 and feeling well. He was in rate controlled afib with consideration for DCCV. Given cardiomyopathy he underwent Lexiscan MPI 07/28/2019 with no evidence of ischemia, EF 63% low risk study.   07/2018 labs showed elevated renal function and high normal potassium. His lasix was changed to as-needed and Losartan was held for 2 days. Repeat labs were normal.   Seen 07/2019 and was feeling well. Lasix was decreased to 27m daily.   Echo 08/2019 showed LVEF 40-45%, global HK, indeterminate LV diastolic parameters.   Last seen 05/31/20 and  was doing well from a cardiac perspective. Euvolemic on exam and couldn't escalate medications due to hypotension.   Today, the patient has no concerns. No chest pain, shortness of breath. He has some intermittent LLE and takes a lasix 479mtablet as needed, based on weight. He might take lasix 1-3 times a week. He weighs himself every day. No orthopnea or pnd. He has COPD, he is a non-smoker. Does no formal activity or walking. Mostly life is sedentary. Has a herniated disc in the back that limits his activity, he is scheduled to see a suPsychologist, sport and exerciseDiet is fairly healthy. Eats low salt diet. EKG shows rate controlled afib. Denies bleeding issues with Eliquis. BP at home generally 130-140/80-90s, but says this is before medcations. BP today 110/68.   Past Medical History:  Diagnosis Date   Arthritis    a. right knee   Asthma    Atrial tachycardia ([facility]   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 etiology.   COPD (chronic obstructive pulmonary disease) (HCC)    Coronary artery disease    a. 05/2012 Cath: severe 3VD-->CABG x 4 by Dr. GeServando [name] 05/2012: Scot JunSVG->LCX & SVG->RPDA; b. 06/2014 MV: no ischemia/infarct.   Diastolic dysfunction    a. 06/2015 Echo: EF 50-55%, Gr1 DD. Mild MR. Mildly dil LA. Nl RV fxn. Nl PASP.   Edema    LEGS/ FEET   Essential hypertension    History of bronchitis    HOH (hard of hearing)    Hypercholesteremia    Neuropathy  PAF (paroxysmal atrial fibrillation) (HCC)    a. CHA2DS2VASc = 4-->Eliquis.   Shingles    Sleep apnea    NO CPAP   Type II diabetes mellitus ([place])     Past Surgical History:  Procedure Laterality Date   ANTERIOR CERVICAL DECOMP/DISCECTOMY FUSION  ~ 2009   BACK SURGERY     CERVICAL FUSION   CARDIAC CATHETERIZATION  05/06/2012   Significant three-vessel coronary artery disease with normal ejection fraction   CATARACT EXTRACTION W/PHACO Right 08/15/2017   Procedure: CATARACT EXTRACTION PHACO AND INTRAOCULAR LENS PLACEMENT ([place]);   Surgeon: Philip Robson, MD;  Location: [facility];  Service: Ophthalmology;  Laterality: Right;  [place] 00:23 AP% 13.3 CDE 3.11 Fluid pack lot # [id number]   CATARACT EXTRACTION W/PHACO Left 10/03/2017   Procedure: CATARACT EXTRACTION PHACO AND INTRAOCULAR LENS PLACEMENT (I[facility]);  Surgeon: Philip Robson, MD;  Location: [facility];  Service: Ophthalmology;  Laterality: Left;  Lot #[id number] H [place]: 00:21.2 AP%:13.9 CDE: 2.96   CORONARY ANGIOPLASTY     CORONARY ARTERY BYPASS GRAFT  05/07/2012   Procedure: CORONARY ARTERY BYPASS GRAFTING (CABG);  Surgeon: Philip Isaac, MD;  Location: [place];  Service: Open Heart Surgery;  Laterality: N/A;  times three   INTRAOPERATIVE TRANSESOPHAGEAL ECHOCARDIOGRAM  05/07/2012   Procedure: INTRAOPERATIVE TRANSESOPHAGEAL ECHOCARDIOGRAM;  Surgeon: Philip Isaac, MD;  Location: [place];  Service: Open Heart Surgery;  Laterality: N/A;   KNEE ARTHROSCOPY  ~ 2000   "right" (05/06/2012)   KNEE SURGERY  2003   right   NECK SURGERY  2008   Plate in neck   TEE WITHOUT CARDIOVERSION N/A 07/04/2019   Procedure: TRANSESOPHAGEAL ECHOCARDIOGRAM (TEE);  Surgeon: Philip Bush, MD;  Location: [facility];  Service: Cardiovascular;  Laterality: N/A;    Current Medications: Current Meds  Medication Sig   acetaminophen (TYLENOL) 500 MG tablet Take 500 mg by mouth every 6 (six) hours as needed.   Albuterol Sulfate 108 (90 Base) MCG/ACT AEPB Inhale 2 puffs into the lungs every 6 (six) hours as needed (shortness of breath).    Artificial Tear Ointment (DRY EYES OP) Place 1 drop into both eyes daily as needed (for dry eyes).   atorvastatin (LIPITOR) 40 MG tablet Take 1 tablet (40 mg total) by mouth daily. PLEASE SCHEDULE OFFICE VISIT FOR FURTHER REFILLS. THANK YOU!   Blood Glucose Monitoring Suppl (ONE TOUCH ULTRA 2) w/Device KIT Use to check blood sugar as advised up to twice a day   cetirizine (ZYRTEC) 10 MG tablet Take 10 mg by mouth daily.    diclofenac sodium (VOLTAREN) 1 % GEL Apply  2 g topically 3 (three) times daily as needed (Use on elbow bursitis and knee).   ELIQUIS 5 MG TABS tablet Take 1 tablet by mouth twice daily   FARXIGA 10 MG TABS tablet Take 1 tablet (10 mg total) by mouth daily before breakfast.   fluticasone (FLONASE) 50 MCG/ACT nasal spray Place 1 spray into both nostrils daily as needed for allergies or rhinitis.   furosemide (LASIX) 40 MG tablet Take 0.5-1 tablets (20-40 mg total) by mouth daily as needed for edema. Only as needed now.   gabapentin (NEURONTIN) 100 MG capsule Take 1 capsule (100 mg total) by mouth 3 (three) times daily.   HYDROcodone-acetaminophen (NORCO/VICODIN) 5-325 MG tablet Take 1 tablet by mouth every 4 (four) hours as needed for moderate pain or severe pain.   ipratropium-albuterol (DUONEB) 0.5-2.5 (3) MG/3ML SOLN Take 3 mLs by nebulization 4 (four) times daily.   LANCETS  ULTRA THIN MISC 1 Device by Does not apply route 4 (four) times daily -  before meals and at bedtime.   levothyroxine (SYNTHROID) 88 MCG tablet TAKE 1 TABLET BY MOUTH ONCE DAILY BEFORE BREAKFAST   losartan (COZAAR) 25 MG tablet Take 0.5 tablets (12.5 mg total) by mouth daily. PLEASE SCHEDULE OFFICE VISIT FOR FURTHER REFILLS. THANK YOU!   metFORMIN (GLUCOPHAGE) 500 MG tablet TAKE 2 TABLETS BY MOUTH TWICE DAILY WITH A MEAL . APPOINTMENT REQUIRED FOR FUTURE REFILLS   metoprolol tartrate (LOPRESSOR) 50 MG tablet TAKE 1 & 1/2 (ONE & ONE-HALF) TABLETS BY MOUTH TWICE DAILY . APPOINTMENT REQUIRED FOR FUTURE REFILLS   montelukast (SINGULAIR) 10 MG tablet Take 10 mg by mouth at bedtime.   ONETOUCH ULTRA test strip USE STRIP TO CHECK GLUCOSE TWICE DAILY   spironolactone (ALDACTONE) 25 MG tablet Take 0.5 tablets (12.5 mg total) by mouth daily.   SYMBICORT 160-4.5 MCG/ACT inhaler Inhale 2 puffs 2 (two) times daily into the lungs.      Allergies:   Bydureon [exenatide], Clindamycin/lincomycin, Penicillins, Sulfa antibiotics, Spiriferis, Spiriva handihaler [tiotropium bromide  monohydrate], and Ivp dye [iodinated diagnostic agents]   Social History   Socioeconomic History   Marital status: Married    Spouse name: Not on file   Number of children: Not on file   Years of education: Not on file   Highest education level: Some college, no degree  Occupational History   Occupation: retired  Tobacco Use   Smoking status: Former    Packs/day: 1.00    Years: 8.00    Pack years: 8.00    Types: Cigarettes    Quit date: 12/31/1967    Years since quitting: 52.9   Smokeless tobacco: Former  Scientific laboratory technician Use: Never used  Substance and Sexual Activity   Alcohol use: No    Alcohol/week: 0.0 standard drinks    Comment: 05/06/2012 "last alcohol several years ago; never had problem wit"   Drug use: No   Sexual activity: Not Currently  Other Topics Concern   Not on file  Social History Narrative   ** Merged History Encounter **          Working part time - not currently working because of covid-19    Social Determinants of Radio broadcast assistant Strain: Low Risk    Difficulty of Paying Living Expenses: Not hard at all  Food Insecurity: No Food Insecurity   Worried About Charity fundraiser in the Last Year: Never true   Arboriculturist in the Last Year: Never true  Transportation Needs: No Transportation Needs   Lack of Transportation (Medical): No   Lack of Transportation (Non-Medical): No  Physical Activity: Inactive   Days of Exercise per Week: 0 days   Minutes of Exercise per Session: 0 min  Stress: Stress Concern Present   Feeling of Stress : To some extent  Social Connections: Not on file     Family History: The patient's family history includes Clotting disorder in his mother; Heart disease in his mother; Hypertension in his sister.  ROS:   Please see the history of present illness.     All other systems reviewed and are negative.  EKGs/Labs/Other Studies Reviewed:    The following studies were reviewed today:  Echo limited  08/2019  1. Left ventricular ejection fraction, by estimation, is 40 to 45%. The  left ventricle has mildly decreased function. The left ventricle  demonstrates global hypokinesis. Left  ventricular diastolic parameters are  indeterminate.   2. Right ventricular systolic function is normal. The right ventricular  size is normal. Tricuspid regurgitation signal is inadequate for assessing  PA pressure.   3. Left atrial size was moderately dilated.   4. The mitral valve is normal in structure. Moderate mitral valve  regurgitation. No evidence of mitral stenosis.   5. Tricuspid valve regurgitation is mild to moderate.   TEE 06/2019  1. Left ventricular ejection fraction, by estimation, is 30 to 35%. The  left ventricle has moderately decreased function. The left ventricle  demonstrates global hypokinesis.   2. Right ventricular systolic function is mildly reduced. The right  ventricular size is mildly enlarged.   3. No left atrial/left atrial appendage thrombus was detected.   4. The mitral valve is degenerative. Mild to moderate mitral valve  regurgitation. No evidence of mitral stenosis.   5. The aortic valve is tricuspid. Aortic valve regurgitation is not  visualized. Mild aortic valve sclerosis is present, with no evidence of  aortic valve stenosis.   6. There is mild (Grade II) plaque involving the descending aorta.  Echo 06/2019  1. Left ventricular ejection fraction, by estimation, is 40 to 45%. The  left ventricle has mildly decreased function. The left ventricle  demonstrates global hypokinesis. The left ventricular internal cavity size  was moderately dilated. Left ventricular  diastolic parameters are indeterminate.   2. Right ventricular systolic function is mildly reduced. The right  ventricular size is mildly enlarged. There is moderately elevated  pulmonary artery systolic pressure.   3. Left atrial size was mildly dilated.   4. Right atrial size was mildly dilated.   5.  The mitral valve is normal in structure and function. Mild to moderate  mitral valve regurgitation. No evidence of mitral stenosis.   6. Tricuspid valve regurgitation is mild to moderate.   7. No valve vegetation noted.   Myoview Lexiscan 06/2019 The study is normal. This is a low risk study. The left ventricular ejection fraction is normal (63%). There is no evidence for ischemia   EKG:  EKG is ordered today.  The ekg ordered today demonstrates Afib, 78bpm, nonspecific T wave changes, low voltage  Recent Labs: 05/31/2020: ALT 14; BUN 24; Creatinine, Ser 1.23; Hemoglobin 15.2; Platelets 206; Potassium 4.2; Sodium 141  Recent Lipid Panel    Component Value Date/Time   CHOL 145 11/21/2019 0915   CHOL 139 12/14/2014 0834   TRIG 99 11/21/2019 0915   HDL 67 11/21/2019 0915   HDL 43 12/14/2014 0834   CHOLHDL 2.2 11/21/2019 0915   VLDL 20 11/21/2019 0915   LDLCALC 58 11/21/2019 0915   LDLCALC 61 06/24/2018 0846    Physical Exam:    VS:  BP 110/68 (BP Location: Left Arm, Patient Position: Sitting, Cuff Size: Normal)   Pulse 78   Ht _0  (1.803 m)   Wt 201 lb 6 oz (91.3 kg)   SpO2 97%   BMI 28.09 kg/m     Wt Readings from Last 3 Encounters:  12/01/20 201 lb 6 oz (91.3 kg)  08/03/20 200 lb (90.7 kg)  06/10/20 205 lb 12.8 oz (93.4 kg)     GEN:  Well nourished, well developed in no acute distress HEENT: Normal NECK: No JVD; No carotid bruits LYMPHATICS: No lymphadenopathy CARDIAC: Irreg Irreg, no murmurs, rubs, gallops RESPIRATORY:  Clear to auscultation without rales, wheezing or rhonchi  ABDOMEN: Soft, non-tender, non-distended MUSCULOSKELETAL:  No edema; No deformity  SKIN: Warm  and dry NEUROLOGIC:  Alert and oriented x 3 PSYCHIATRIC:  Normal affect   ASSESSMENT:    1. HFrEF (heart failure with reduced ejection fraction) ([place])   2. Permanent atrial fibrillation ([place])   3. S/P CABG (coronary artery bypass graft)   4. Coronary artery disease involving native  coronary artery of native heart without angina pectoris   5. Chronic obstructive pulmonary disease, unspecified COPD type ([place])   6. Diabetes mellitus type 2 with complications ([place])   7. Essential hypertension   8. Hyperlipidemia LDL goal <70   9. Cardiomyopathy, unspecified type ([place])   10. Type 2 diabetes mellitus with hyperlipidemia (HCC)    PLAN:    In order of problems listed above:  CAD s/p CABG Patient is stable with no anginal symptoms. EKG with no ischemic changes. No plan for ischemic evaluation at this time. Continue BB and statin. No ASA with Eliquis.   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 TEE 06/2019 showed LVEF 30-35%. Echo 08/2019 showed LVEF 40-45%, global HK, normal LV size. He is euvolemic on exam. He takes Lasix 26m PRN (1-3 times a week) depending on weight. He takes metoprolol 744mBID, dapagliflozin 1057maily, Losartan 12.5mg49mily. Unable to titrate medications at last visit due to hypotension. BP today 110/68. I will add on spironolactone 12.5 mg daily. BMET in a week. Suspect won't need lasix as much given addition of spironolactone. Encouraged daily Bps 2 hours after medications. Re-check limited echo in 2 months and follow-up in 3 months.   Permanent Afib EKG shows rate controlled afib. Continue Eliquis 5mg 74m for stroke prophylaxis. He denies bleeding issues. Continue metoprolol for rate control  HTN BP today 110/68. At home they report it is much higher, but this is before medications. Add spironolactone as above. Recommended BP 2 hours after medications as above, call if Bps too low or patient is dizzy/lightheaded.  HLD LDL 58 in 10/2019. Continue atorvastatin 40mg 53my. Can repeat labs at follow-up.   COPD Reports breathing is the same. Likely contributing to sedentary lifestyle  DM2 A1C 8.7 in 06/2020. Followed by PCP.  Disposition: Follow up in 3 month(s) with MD/APP   Shared Decision Making/Informed Consent         Signed, Kahlel Peake H FurtNinfa Meeker  12/01/2020 12:03 PM    [facility]

## 2020-12-01 ENCOUNTER — Ambulatory Visit: Payer: Medicare Other | Admitting: Medical

## 2020-12-01 ENCOUNTER — Other Ambulatory Visit: Payer: Self-pay

## 2020-12-01 ENCOUNTER — Encounter: Payer: Self-pay | Admitting: Medical

## 2020-12-01 VITALS — BP 110/68 | HR 78 | Ht 71.0 in | Wt 201.4 lb

## 2020-12-01 DIAGNOSIS — Z951 Presence of aortocoronary bypass graft: Secondary | ICD-10-CM

## 2020-12-01 DIAGNOSIS — I2510 Atherosclerotic heart disease of native coronary artery without angina pectoris: Secondary | ICD-10-CM | POA: Diagnosis not present

## 2020-12-01 DIAGNOSIS — I5020 Unspecified systolic (congestive) heart failure: Secondary | ICD-10-CM

## 2020-12-01 DIAGNOSIS — I10 Essential (primary) hypertension: Secondary | ICD-10-CM

## 2020-12-01 DIAGNOSIS — E785 Hyperlipidemia, unspecified: Secondary | ICD-10-CM

## 2020-12-01 DIAGNOSIS — I429 Cardiomyopathy, unspecified: Secondary | ICD-10-CM | POA: Diagnosis not present

## 2020-12-01 DIAGNOSIS — E118 Type 2 diabetes mellitus with unspecified complications: Secondary | ICD-10-CM | POA: Diagnosis not present

## 2020-12-01 DIAGNOSIS — E1169 Type 2 diabetes mellitus with other specified complication: Secondary | ICD-10-CM | POA: Diagnosis not present

## 2020-12-01 DIAGNOSIS — I4821 Permanent atrial fibrillation: Secondary | ICD-10-CM | POA: Diagnosis not present

## 2020-12-01 DIAGNOSIS — J449 Chronic obstructive pulmonary disease, unspecified: Secondary | ICD-10-CM

## 2020-12-01 MED ORDER — SPIRONOLACTONE 25 MG PO TABS
12.5000 mg | ORAL_TABLET | Freq: Every day | ORAL | 3 refills | Status: DC
Start: 2020-12-01 — End: 2021-11-23

## 2020-12-01 NOTE — Patient Instructions (Addendum)
Medication Instructions:  Your physician has recommended you make the following change in your medication:   START Spironolactone 25 mg and take half a tablet (12.5 mg) once daily.   *If you need a refill on your cardiac medications before your next appointment, please call your pharmacy*   Lab Work: BMET in 1 week (next week on 12/09/20 Thursday or  12/10/20 Friday). No appointment needed for this. Go over at the [place] at [facility] then go to 1st desk on the right to check in, past the screening table. Lab hours: Monday- Friday (7:30 am- 5:30 pm)  If you have labs (blood work) drawn today and your tests are completely normal, you will receive your results only by: [place] (if you have MyChart) OR A paper copy in the mail If you have any lab test that is abnormal or we need to change your treatment, we will call you to review the results.   Testing/Procedures: Your physician has requested that you have a Limited echocardiogram in 2 months.  Echocardiography is a painless test that uses sound waves to create images of your heart. It provides your doctor with information about the size and shape of your heart and how well your heart's chambers and valves are working. This procedure takes approximately one hour. There are no restrictions for this procedure.    Follow-Up: At [facility], you and your health needs are our priority.  As part of our continuing mission to provide you with exceptional heart care, we have created designated Provider Care Teams.  These Care Teams include your primary Cardiologist (physician) and Advanced Practice Providers (APPs -  Physician Assistants and Nurse Practitioners) who all work together to provide you with the care you need, when you need it.  We recommend signing up for the patient portal called "MyChart".  Sign up information is provided on this After Visit Summary.  MyChart is used to connect with patients for Virtual Visits  (Telemedicine).  Patients are able to view lab/test results, encounter notes, upcoming appointments, etc.  Non-urgent messages can be sent to your provider as well.   To learn more about what you can do with MyChart, go to [email or web address].    Your next appointment:   3 month(s)  The format for your next appointment:   In Person  Provider:   [name], MD or Cadence Kathlen Mody, [name]   Please monitor blood pressures and keep a log of your readings.   Make sure to check 2 hours after your medications.   AVOID these things for 30 minutes before checking your blood pressure: No Drinking caffeine. No Drinking alcohol. No Eating. No Smoking. No Exercising.  Five minutes before checking your blood pressure: Pee. Sit in a dining chair. Avoid sitting in a soft couch or armchair. Be quiet. Do not talk.

## 2020-12-08 ENCOUNTER — Ambulatory Visit (INDEPENDENT_AMBULATORY_CARE_PROVIDER_SITE_OTHER): Payer: Medicare Other | Admitting: Pharmacist

## 2020-12-08 DIAGNOSIS — N182 Chronic kidney disease, stage 2 (mild): Secondary | ICD-10-CM

## 2020-12-08 DIAGNOSIS — J432 Centrilobular emphysema: Secondary | ICD-10-CM | POA: Diagnosis not present

## 2020-12-08 DIAGNOSIS — E785 Hyperlipidemia, unspecified: Secondary | ICD-10-CM | POA: Diagnosis not present

## 2020-12-08 DIAGNOSIS — I129 Hypertensive chronic kidney disease with stage 1 through stage 4 chronic kidney disease, or unspecified chronic kidney disease: Secondary | ICD-10-CM | POA: Diagnosis not present

## 2020-12-08 DIAGNOSIS — E1169 Type 2 diabetes mellitus with other specified complication: Secondary | ICD-10-CM

## 2020-12-08 NOTE — Chronic Care Management (AMB) (Signed)
Chronic Care Management Pharmacy Note  12/08/2020 Name:  Philip PRINSEN Sr. MRN:  [id number] DOB:  [birth year]   Subjective: Philip Potters Sr. is an 75 y.o. year old male who is a primary patient of [name], DO.  The CCM team was consulted for assistance with disease management and care coordination needs.    Engaged with patient by telephone for follow up visit in response to provider referral for pharmacy case management and/or care coordination services.   Consent to Services:  The patient was given information about Chronic Care Management services, agreed to services, and gave verbal consent prior to initiation of services.  Please see initial visit note for detailed documentation.   Patient Care Team: [name], DO as PCP - General (Family Medicine) [name], MD as PCP - Cardiology (Cardiology) [name], MD as Consulting Physician (Cardiology) Leanor Kail, MD (Inactive) (Orthopedic Surgery) Erby Pian, MD as Referring Physician (Specialist) Curley Spice Virl Diamond, RPH-CPP as Pharmacist Minor, Dalbert Garnet, RN (Inactive) as Case Manager   Recent consult visits: Office Visit with [facility] on 7/25 Office Visit with [place] [facility] visits: None in previous 6 months  Objective:  Lab Results  Component Value Date   CREATININE 1.23 05/31/2020   CREATININE 1.07 11/21/2019   CREATININE 1.09 08/26/2019    Lab Results  Component Value Date   HGBA1C 8.7 06/01/2020   Last diabetic Eye exam:  Lab Results  Component Value Date/Time   HMDIABEYEEXA No Retinopathy 07/08/2018 12:00 AM    Last diabetic Foot exam: No results found for: HMDIABFOOTEX      Component Value Date/Time   CHOL 145 11/21/2019 0915   CHOL 139 12/14/2014 0834   TRIG 99 11/21/2019 0915   HDL 67 11/21/2019 0915   HDL 43 12/14/2014 0834   CHOLHDL 2.2 11/21/2019 0915   VLDL 20  11/21/2019 0915   LDLCALC 58 11/21/2019 0915   LDLCALC 61 06/24/2018 0846    Hepatic Function Latest Ref Rng & Units 05/31/2020 11/21/2019 07/11/2019  Total Protein 6.0 - 8.5 g/dL 5.9(L) 6.6 -  Albumin 3.7 - 4.7 g/dL 4.2 4.1 3.1(A)  AST 0 - 40 IU/L 11 15 19   ALT 0 - 44 IU/L 14 21 8(A)  Alk Phosphatase 44 - 121 IU/L 87 60 -  Total Bilirubin 0.0 - 1.2 mg/dL 0.3 0.9 -  Bilirubin, Direct 0.00 - 0.40 mg/dL - - -     Social History   Tobacco Use  Smoking Status Former   Packs/day: 1.00   Years: 8.00   Pack years: 8.00   Types: Cigarettes   Quit date: 12/31/1967   Years since quitting: 52.9  Smokeless Tobacco Former   BP Readings from Last 3 Encounters:  12/01/20 110/68  06/10/20 100/65  05/31/20 112/68   Pulse Readings from Last 3 Encounters:  12/01/20 78  06/10/20 85  05/31/20 67   Wt Readings from Last 3 Encounters:  12/01/20 201 lb 6 oz (91.3 kg)  08/03/20 200 lb (90.7 kg)  06/10/20 205 lb 12.8 oz (93.4 kg)    Assessment: Review of patient past medical history, allergies, medications, health status, including review of consultants reports, laboratory and other test data, was performed as part of comprehensive evaluation and provision of chronic care management services.   SDOH:  (Social Determinants of Health) assessments and interventions performed: none   CCM Care Plan  Allergies  Allergen Reactions  Bydureon [Exenatide] Anaphylaxis    Possible from bydureon, not 100% confirmed   Clindamycin/Lincomycin Hives and Itching   Penicillins Hives, Itching, Rash and Other (See Comments)    Has patient had a PCN reaction causing immediate rash, facial/tongue/throat swelling, SOB or lightheadedness with hypotension: Yes Has patient had a PCN reaction causing severe rash involving mucus membranes or skin necrosis: No Has patient had a PCN reaction that required hospitalization: No Has patient had a PCN reaction occurring within the last 10 years: No If all of the above  answers are "NO", then may proceed with Cephalosporin use.   Sulfa Antibiotics Shortness Of Breath   Spiriferis Other (See Comments)    Affected breathing   Spiriva Handihaler [Tiotropium Bromide Monohydrate]     Affected breathing   Ivp Dye [Iodinated Diagnostic Agents] Rash and Other (See Comments)    Redness    Medications Reviewed Today     Reviewed by [name], RPH-CPP (Pharmacist) on 12/08/20 at 1129  Med List Status: <None>   Medication Order Taking? Sig Documenting Provider Last Dose Status Informant  acetaminophen (TYLENOL) 500 MG tablet [id number]  Take 500 mg by mouth every 6 (six) hours as needed. [provider]  Active   Albuterol Sulfate 108 (90 Base) MCG/ACT AEPB [id number] Yes Inhale 2 puffs into the lungs every 6 (six) hours as needed (shortness of breath).  [provider] Taking Active Pharmacy Records  Artificial Tear Ointment (DRY EYES OP) [id number]  Place 1 drop into both eyes daily as needed (for dry eyes). [provider]  Active Pharmacy Records  atorvastatin (LIPITOR) 40 MG tablet [id number]  Take 1 tablet (40 mg total) by mouth daily. PLEASE SCHEDULE OFFICE VISIT FOR FURTHER REFILLS. THANK YOU! [name], MD  Active   Blood Glucose Monitoring Suppl (ONE TOUCH ULTRA 2) w/Device KIT [id number]  Use to check blood sugar as advised up to twice a day [name], DO  Active Pharmacy Records  cetirizine (ZYRTEC) 10 MG tablet [id number] Yes Take 10 mg by mouth daily.  [provider] Taking Active Pharmacy Records  cyclobenzaprine (FLEXERIL) 10 MG tablet [id number]  Take 10 mg by mouth 3 (three) times daily.  Patient not taking: Reported on 12/01/2020   [provider]  Active   diclofenac sodium (VOLTAREN) 1 % GEL [id number]  Apply 2 g topically 3 (three) times daily as needed (Use on elbow bursitis and knee). [name], DO  Active   ELIQUIS 5 MG TABS tablet [id number]  Take 1 tablet by  mouth twice daily Loel Dubonnet, NP  Active   FARXIGA 10 MG TABS tablet [id number] Yes Take 1 tablet (10 mg total) by mouth daily before breakfast. [name], DO Taking Active   fluticasone (FLONASE) 50 MCG/ACT nasal spray [id number] Yes Place 1 spray into both nostrils daily as needed for allergies or rhinitis. [provider] Taking Active   furosemide (LASIX) 40 MG tablet [id number] Yes Take 0.5-1 tablets (20-40 mg total) by mouth daily as needed for edema. Only as needed now. [name], DO Taking Active   gabapentin (NEURONTIN) 100 MG capsule [id number]  Take 1 capsule (100 mg total) by mouth 3 (three) times daily. [name], DO  Active   HYDROcodone-acetaminophen (NORCO/VICODIN) 5-325 MG tablet [id number] Yes Take 1 tablet by mouth 2 (two) times daily as needed. [provider]  Active   ipratropium-albuterol (DUONEB) 0.5-2.5 (3) MG/3ML SOLN [id number] Yes Take 3 mLs by nebulization  4 (four) times daily. [provider] Taking Active Pharmacy Records           Med Note Eugenio Hoes, Allen Norris   Wed Dec 01, 2020 10:16 AM) PRN   LANCETS ULTRA THIN MISC [id number]  1 Device by Does not apply route 4 (four) times daily -  before meals and at bedtime. [name], MD  Active Pharmacy Records  levothyroxine (SYNTHROID) 88 MCG tablet [id number]  TAKE 1 TABLET BY MOUTH ONCE DAILY BEFORE BREAKFAST [name], Alexander Lenna Sciara, DO  Active   losartan (COZAAR) 25 MG tablet [id number] Yes Take 0.5 tablets (12.5 mg total) by mouth daily. PLEASE SCHEDULE OFFICE VISIT FOR FURTHER REFILLS. THANK YOU! [name], MD Taking Active   metFORMIN (GLUCOPHAGE) 500 MG tablet [id number] Yes TAKE 2 TABLETS BY MOUTH TWICE DAILY WITH A MEAL . APPOINTMENT REQUIRED FOR FUTURE REFILLS [name], DO Taking Active   metoprolol tartrate (LOPRESSOR) 50 MG tablet [id number] Yes TAKE 1 & 1/2 (ONE & ONE-HALF) TABLETS BY MOUTH TWICE DAILY . APPOINTMENT  REQUIRED FOR FUTURE REFILLS Christell Faith M, [name] Taking Active   montelukast (SINGULAIR) 10 MG tablet [id number] Yes Take 10 mg by mouth at bedtime. [provider] Taking Active   [facility] ULTRA test strip [id number]  USE STRIP TO CHECK GLUCOSE TWICE DAILY [name], DO  Active   spironolactone (ALDACTONE) 25 MG tablet [id number] Yes Take 0.5 tablets (12.5 mg total) by mouth daily. Kathlen Mody, [name], [name] Taking Active   SYMBICORT 160-4.5 MCG/ACT inhaler [id number] Yes Inhale 2 puffs 2 (two) times daily into the lungs.  [provider] Taking Active Pharmacy Records            Patient Active Problem List   Diagnosis Date Noted   Chronic right hip pain 11/19/2019   Thrush    MSSA bacteremia    Chronic systolic CHF (congestive heart failure) (HCC)    AF (paroxysmal atrial fibrillation) (HCC)    HFrEF (heart failure with reduced ejection fraction) ([place])    Community acquired pneumonia of left lower lobe of lung    COPD with acute exacerbation ([place]) 06/26/2019   Overweight (BMI 25.0-29.9) 07/01/2018   Osteoarthritis of multiple joints 05/14/2017   Chronic pain of left knee 05/14/2017   OSA (obstructive sleep apnea) 02/28/2017   CKD (chronic kidney disease), stage II 02/28/2017   Chronic anticoagulation 02/28/2017   Onychomycosis of left great toe 08/14/2016   Allergic rhinitis due to allergen 07/31/2016   Peripheral neuropathy 08/17/2015   Type 2 diabetes mellitus with hyperlipidemia ([place]) 08/17/2015   (HFpEF) heart failure with preserved ejection fraction ([place]) 07/25/2015   Atrial fibrillation with rapid ventricular response ([place]) 12/10/2014   Centrilobular emphysema ([place]) 11/09/2014   Bradycardia 11/09/2014   Cardiac conduction disorder 11/09/2014   Benign hypertension with CKD (chronic kidney disease), stage II 11/09/2014   Bursitis of hip 11/09/2014   Enlarged prostate 11/09/2014   Coronary artery disease    S/P CABG x 3 05/12/2012   Hypothyroid  05/06/2012   Hyperlipidemia associated with type 2 diabetes mellitus ([place]) 03/31/2012   Colon polyp 12/21/2003    Immunization History  Administered Date(s) Administered   Fluad Quad(high Dose 65+) 12/31/2018   Influenza, High Dose Seasonal PF 01/06/2015, 02/28/2017   Influenza-Unspecified 02/08/2014, 03/01/2018   Pneumococcal Conjugate-13 01/02/2014   Pneumococcal Polysaccharide-23 05/01/2012   Tdap 05/01/2012    Conditions to be addressed/monitored: Atrial Fibrillation, HTN, HLD, COPD, hypothyroidism and DMII  Care Plan : PharmD -  Medication Management/Medication Assistance  Updates made by [name], RPH-CPP since 12/08/2020 12:00 AM     Problem: Disease Progression      Long-Range Goal: Disease Progression Prevented or Minimized   Start Date: 06/16/2020  Expected End Date: 09/14/2020  This Visit's Progress: On track  Recent Progress: On track  Priority: High  Note:   Current Barriers:  Knowledge deficits related to dietary choices to improve blood sugar control Financial - difficulty with affording [name] through Medicare Part D health plan Patient APPROVED for patient assistance for Farxiga from [employer] through 04/30/2021  Pharmacist Clinical Goal(s):  Over the next 90 days, patient will achieve control of T2DM as evidenced by A1C through collaboration with PharmD and provider.   Interventions: 1:1 collaboration with [name], DO regarding development and update of comprehensive plan of care as evidenced by provider attestation and co-signature Inter-disciplinary care team collaboration (see longitudinal plan of care) Perform chart review Patient seen for Office Visit with [facility] on 7/25. Provider advised patient:  Continue gabapentin, exercises from physical therapy, and Norco as needed Start prednisone 6 day taper If his pain does not improve by next week he will call our office at which time I  will place an order for an updated MRI of the lumbar spine and refer him to neurosurgery for evaluation Note patient contacted office on 8/2 to let provider know pain not improved. Provider placed order for MRI and referral to neurosurgery Office Visit with [facility] on 8/3. Provider advised patient: Start spironolactone 12.5 mg daily BMET lab in a week at [facility] daily BPs 2 hours after medications and to call Cardiology if BPs too low or patient is dizzy/lightheaded  Type 2 Diabetes Uncontrolled based on latest A1C, but home CBG readings improved; current treatment: metformin 500 mg - 2 tablets (1,000 mg) twice daily as directed Farxiga 10 mg - 1 tablet QAM   Review recent blood sugar results:  Fasting Blood Glucose After Supper (~2 hours)  3 - August 124 232  4 - August 134 183  5 - August 123 208  6 - August 106 236  7 - August 131 175  8 - August 121 143  9 - August 116 130  10 - August 127   Average 123 187  Reports has notice blood sugar readings higher when eats out vs eating at home Reports continues changes to dietary habits to reduce carbohydrate consumption: Cutting back on snacking, soft drinks and less sugar in his tea Changing snacks to fresh cucumbers and onions Exercise: Note patient's mobility limited by knee and back pain.  Reports currently walks for ~15-20 minutes most days   CHF/HTN Current treatment: Losartan 25 mg - 1/2 tablet (12.5 mg) once daily Metoprolol 75 mg twice daily Spironolactone 25 mg - 1/2 tablet (12.5 mg) daily (started last week) Farxiga 10 mg - 1 tablets daily Furosemide 40 mg daily as needed as directed by Cardiologist Denies needing since started spironolactone Reports recent BP results:  AM BP PM BP  3 - August 147/82, HR 65 117/80, HR 85  4 - August 137/85, HR 74 113/74, HR 80  5 - August 146/88, HR 79 118/86, HR 77  6 - August 149/99, HR 67 124/77, HR 88  7 - August 130/89, HR 75 123/82, HR ?  8 - August  143/96, HR 72 123/91, HR 83  9 - August 142/92, HR 73 130/84, HR 83  10 -  August 149/92, HR 75   Confirms has been weighing himself daily and following directions from Cardiologist for use of PRN furosemide (Lasix).  Counsel on importance of contacting Cardiologist for BP and HR readings outside of established parameters or new/worsening symtpoms Confirms planning to go for BMET lab work tomorrow  COPD/seasonal allergies: Patient follows with Dr. Raul Del at [facility] Current treatment: Symbicort 160/4.5 two puffs bid Montelukast 10 mg QHS Albuterol/ipratropium nebulizer solution/ albuterol inhaler as needed Cetirizine 10 mg daily Flonase nasal spray as needed Using saline Navage pods Confirms using maintenance (Symbicort) inhaler twice daily and rinsing mouth out after each use and using rescue inhaler (albuterol) as needed  Sleep Apnea: Follow up with patient regarding sleep apnea/CPAP Counsel on rational for/encourage patient to use CPAP nightly Have discussed strategies to improve control of allergy symptoms.  Encourage patient to contact DME company/Pulmonologist if needed for additional support/regarding moisture settings  Medication/Device Adherence Note patient uses weekly pillbox to organize medications  Medication Assistance: Patient currently receiving patient assistance for Farxiga from [employer] through 04/30/2021 Reports difficulty with affording cost of Eliquis, particularly as concerned he is close to being in the coverage gap of his Part D plan Collaborating with [place] for assistance to patient with applying for patient assistance for Eliquis Reports planning to mail paperwork back to [place] today  Patient Goals/Self-Care Activities Over the next 90 days, patient will:  Self administers medications as prescribed Attends all scheduled provider appointments Calls pharmacy for medication refills Calls provider office for new concerns  or questions  Patient to check blood sugar regularly as directed and keep log Patient to keep log when checks blood pressure  Follow Up Plan: Telephone follow up appointment with care management team member scheduled for: 9/14 at 10:30 am      Medication Assistance: Patient APPROVED for patient assistance for Farxiga from [employer] through 04/30/2021  Patient's preferred pharmacy is:  [facility] [address] ([address]), [place]) [place] [address] Phone: [phone] Fax: [phone]  Uses pill box? Yes  Follow Up:  Patient agrees to Care Plan and Follow-up.  Wallace Cullens, PharmD, Para [place], CPP Clinical Pharmacist [facility] [phone]

## 2020-12-08 NOTE — Patient Instructions (Signed)
Visit Information  PATIENT GOALS:  Goals Addressed             This Visit's Progress    Pharmacy Goals       Our goal A1c is less than 7%. This corresponds with fasting sugars less than 130 and 2 hour after meal sugars less than 180. Please check your blood sugar and keep log of results  Please continue to monitor your home weight, blood pressure and heart rate  Our goal bad cholesterol, or LDL, is less than 70 . This is why it is important to continue taking your atorvastatin   Feel free to call me with any questions or concerns. I look forward to our next call!  Wallace Cullens, PharmD, [place] [phone]         The patient verbalized understanding of instructions, educational materials, and care plan provided today and declined offer to receive copy of patient instructions, educational materials, and care plan.   Telephone follow up appointment with care management team member scheduled for: 9/14 at 10:30 am

## 2020-12-09 ENCOUNTER — Other Ambulatory Visit
Admission: RE | Admit: 2020-12-09 | Discharge: 2020-12-09 | Disposition: A | Discharge status: Home / Self Care | Payer: Medicare Other | Attending: Medical | Admitting: Medical

## 2020-12-09 DIAGNOSIS — I4821 Permanent atrial fibrillation: Secondary | ICD-10-CM

## 2020-12-09 DIAGNOSIS — I2510 Atherosclerotic heart disease of native coronary artery without angina pectoris: Secondary | ICD-10-CM

## 2020-12-09 DIAGNOSIS — I429 Cardiomyopathy, unspecified: Secondary | ICD-10-CM | POA: Diagnosis not present

## 2020-12-09 DIAGNOSIS — E118 Type 2 diabetes mellitus with unspecified complications: Secondary | ICD-10-CM

## 2020-12-09 DIAGNOSIS — J449 Chronic obstructive pulmonary disease, unspecified: Secondary | ICD-10-CM

## 2020-12-09 DIAGNOSIS — E785 Hyperlipidemia, unspecified: Secondary | ICD-10-CM | POA: Diagnosis not present

## 2020-12-09 DIAGNOSIS — I10 Essential (primary) hypertension: Secondary | ICD-10-CM | POA: Diagnosis not present

## 2020-12-09 DIAGNOSIS — I5020 Unspecified systolic (congestive) heart failure: Secondary | ICD-10-CM | POA: Diagnosis not present

## 2020-12-09 DIAGNOSIS — Z951 Presence of aortocoronary bypass graft: Secondary | ICD-10-CM

## 2020-12-09 LAB — BASIC METABOLIC PANEL
Anion gap: 8 (ref 5–15)
BUN: 16 mg/dL (ref 8–23)
CO2: 27 mmol/L (ref 22–32)
Calcium: 9.1 mg/dL (ref 8.9–10.3)
Chloride: 105 mmol/L (ref 98–111)
Creatinine, Ser: 1.01 mg/dL (ref 0.61–1.24)
GFR, Estimated: >60 mL/min (ref >60)
Glucose, Bld: 141 mg/dL — ABNORMAL HIGH (ref 70–99)
Potassium: 4.1 mmol/L (ref 3.5–5.1)
Sodium: 140 mmol/L (ref 135–145)

## 2020-12-10 ENCOUNTER — Ambulatory Visit
Admission: RE | Admit: 2020-12-10 | Discharge: 2020-12-10 | Disposition: A | Discharge status: Home / Self Care | Payer: Medicare Other | Source: Ambulatory Visit | Attending: Family Medicine | Admitting: Family Medicine

## 2020-12-10 ENCOUNTER — Other Ambulatory Visit: Payer: Self-pay

## 2020-12-10 DIAGNOSIS — M5136 Other intervertebral disc degeneration, lumbar region: Secondary | ICD-10-CM

## 2020-12-10 DIAGNOSIS — M5416 Radiculopathy, lumbar region: Secondary | ICD-10-CM | POA: Insufficient documentation

## 2020-12-10 DIAGNOSIS — M545 Low back pain, unspecified: Secondary | ICD-10-CM | POA: Diagnosis not present

## 2020-12-16 DIAGNOSIS — M5416 Radiculopathy, lumbar region: Secondary | ICD-10-CM | POA: Diagnosis not present

## 2020-12-17 ENCOUNTER — Telehealth: Payer: Self-pay | Admitting: Cardiovascular Disease

## 2020-12-17 ENCOUNTER — Other Ambulatory Visit: Payer: Self-pay | Admitting: Neurosurgery

## 2020-12-17 NOTE — Telephone Encounter (Signed)
Patient with diagnosis of atrial fibrillation on Eliquis for anticoagulation.    Procedure: R L3-4 microdiscectomy Date of procedure: 01/07/21   CHA2DS2-VASc Score = 5  This indicates a 7.2% annual risk of stroke. The patient's score is based upon: CHF History: Yes HTN History: Yes Diabetes History: Yes Stroke History: No Vascular Disease History: Yes Age Score: 1 Gender Score: 0   CrCl 75 (adjusted body weight) Platelet count 206   Per office protocol, patient can hold Eliquis for 3 days prior to procedure.   Patient will not need bridging with Lovenox (enoxaparin) around procedure.  Surgeon is asking for 2 week hold after procedure - will need to send to primary cardiologist for final approval.

## 2020-12-17 NOTE — Telephone Encounter (Signed)
   [facility] Pre-operative Risk Assessment    Patient Name: Philip [name]  DOB: 11-29-1945 MRN: [id number]  HEARTCARE STAFF:  - IMPORTANT!!!!!! Under Visit Info/Reason for Call, type in Other and utilize the format Clearance MM/DD/YY or Clearance TBD. Do not use dashes or single digits. - Please review there is not already an duplicate clearance open for this procedure. - If request is for dental extraction, please clarify the # of teeth to be extracted. - If the patient is currently at the dentist's office, call Pre-Op Callback Staff (MA/nurse) to input urgent request.  - If the patient is not currently in the dentist office, please route to the Pre-Op pool.  Request for surgical clearance:  What type of surgery is being performed? Right L3-4 microdiscectomy   When is this surgery scheduled? 01/07/21  What type of clearance is required (medical clearance vs. Pharmacy clearance to hold med vs. Both)? both  Are there any medications that need to be held prior to surgery and how long? Eliquis stop 72 hours before surgery and resume 2 weeks after surgery  Practice name and name of physician performing surgery? [facility] - Dr Meade Maw   What is the office phone number? [phone]   7.   What is the office fax number? [phone]  8.   Anesthesia type (None, local, MAC, general) ? Not listed    Ace Gins 12/17/2020, 2:09 PM  _________________________________________________________________   (provider comments below)

## 2020-12-22 NOTE — Telephone Encounter (Signed)
Agree with holding Eliquis 3 days before surgery.  However, holding Eliquis for 2 weeks after surgery is too long in my opinion and can significantly increase his risk of stroke given CHA2DS2-VASc score of 5. I think we have to look at the risks and benefits in order to make an informed decision.  I included Dr. Cari Caraway on this message for his input.

## 2020-12-24 NOTE — Telephone Encounter (Signed)
    Patient Name: Philip [name]  DOB: 06-Feb-1946 MRN: [id number]  Primary Cardiologist: [name], MD  Chart reviewed as part of pre-operative protocol coverage. Given past medical history and time since last visit, based on ACC/AHA guidelines, Philip BERTRAND Sr. would be at acceptable risk for the planned procedure without further cardiovascular testing.   Patient with diagnosis of atrial fibrillation on Eliquis for anticoagulation.     Procedure: R L3-4 microdiscectomy Date of procedure: 01/07/21   CHA2DS2-VASc Score = 5  This indicates a 7.2% annual risk of stroke. The patient's score is based upon: CHF History: Yes HTN History: Yes Diabetes History: Yes Stroke History: No Vascular Disease History: Yes Age Score: 1 Gender Score: 0    CrCl 75 (adjusted body weight) Platelet count 206   Per office protocol, recommended that the patient hold Eliquis for 3 days prior to procedure with no bridging with Lovenox (enoxaparin) around procedure.   However, the surgeon is asking for a 2 week hold after the procedure therefore case reviewed with Dr. Fletcher Anon who states that the patient may proceed with the 3 day hold however he initially felt strongly against the two week post-op hold. After further review with Dr. Izora Ribas, he states that the patient may proceed with the prolonged anticoagulation hold however will need to understand the higher risk of stroke during this time given a CHA2DS2VASc score of 5. Pt will need to be fully informed and agree with this before proceeding.   I will route this recommendation to the requesting party via Epic fax function and remove from pre-op pool.  Please call with questions.  Kathyrn Drown, NP 12/24/2020, 2:03 PM

## 2020-12-24 NOTE — Telephone Encounter (Signed)
I am fine with that as long as the patient understands the small risk. Thanks.

## 2020-12-31 ENCOUNTER — Other Ambulatory Visit: Payer: Self-pay

## 2020-12-31 ENCOUNTER — Encounter: Payer: Self-pay | Admitting: Neurosurgery

## 2020-12-31 ENCOUNTER — Encounter: Payer: Self-pay | Admitting: Urgent Care

## 2020-12-31 ENCOUNTER — Other Ambulatory Visit
Admission: RE | Admit: 2020-12-31 | Discharge: 2020-12-31 | Disposition: A | Discharge status: Home / Self Care | Payer: Medicare Other | Source: Ambulatory Visit | Attending: Neurosurgery | Admitting: Neurosurgery

## 2020-12-31 DIAGNOSIS — Z01812 Encounter for preprocedural laboratory examination: Secondary | ICD-10-CM | POA: Insufficient documentation

## 2020-12-31 LAB — PROTIME-INR
INR: 1.3 — ABNORMAL HIGH (ref 0.8–1.2)
Prothrombin Time: 15.7 seconds — ABNORMAL HIGH (ref 11.4–15.2)

## 2020-12-31 LAB — CBC
HCT: 45.9 % (ref 39.0–52.0)
Hemoglobin: 15.3 g/dL (ref 13.0–17.0)
MCH: 29.5 pg (ref 26.0–34.0)
MCHC: 33.3 g/dL (ref 30.0–36.0)
MCV: 88.6 fL (ref 80.0–100.0)
Platelets: 218 10*3/uL (ref 150–400)
RBC: 5.18 MIL/uL (ref 4.22–5.81)
RDW: 14.6 % (ref 11.5–15.5)
WBC: 8.2 10*3/uL (ref 4.0–10.5)
nRBC: 0 % (ref 0.0–0.2)

## 2020-12-31 LAB — APTT: aPTT: 36 seconds (ref 24–36)

## 2020-12-31 LAB — SURGICAL PCR SCREEN
MRSA, PCR: NEGATIVE
Staphylococcus aureus: NEGATIVE

## 2020-12-31 LAB — TYPE AND SCREEN
ABO/RH(D): A POS
Antibody Screen: NEGATIVE

## 2020-12-31 NOTE — Patient Instructions (Addendum)
Your procedure is scheduled on: Friday January 07, 2021. Report to Day Surgery inside [facility] 2nd floor stop by admissions desk first before getting on elevator.  To find out your arrival time please call [phone] between 1PM - 3PM on Thursday January 06, 2021.  Remember: Instructions that are not followed completely may result in serious medical risk,  up to and including death, or upon the discretion of your surgeon and anesthesiologist your  surgery may need to be rescheduled.     _X__ 1. Do not eat food after midnight the night before your procedure.                 No chewing gum or hard candies. You may drink clear liquids up to 2 hours                 before you are scheduled to arrive for your surgery- DO not drink clear                 liquids within 2 hours of the start of your surgery.                 Clear Liquids include:  water, Black Coffee or Tea (Do not add                 anything to coffee or tea).  __X__2.  On the morning of surgery brush your teeth with toothpaste and water, you                may rinse your mouth with mouthwash if you wish.  Do not swallow any toothpaste of mouthwash.     _X__ 3.  No Alcohol for 24 hours before or after surgery.   _X__ 4.  Do Not Smoke or use e-cigarettes For 24 Hours Prior to Your Surgery.                 Do not use any chewable tobacco products for at least 6 hours prior to                 Surgery.  _X__  5.  Do not use any recreational drugs (marijuana, cocaine, heroin, ecstasy, MDMA or other)                For at least one week prior to your surgery.  Combination of these drugs with anesthesia                May have life threatening results.  __X__6.  Notify your doctor if there is any change in your medical condition      (cold, fever, infections).     Do not wear jewelry, make-up, hairpins, clips or nail polish. Do not wear lotions, powders, or perfumes.  Do not shave 48 hours prior to  surgery. Men may shave face and neck. Do not bring valuables to the hospital.    [facility] is not responsible for any belongings or valuables.  Contacts, dentures or bridgework may not be worn into surgery. Leave your suitcase in the car. After surgery it may be brought to your room. For patients admitted to the hospital, discharge time is determined by your treatment team.   Patients discharged the day of surgery will not be allowed to drive home.   Make arrangements for someone to be with you for the first 24 hours of your Same Day Discharge.   __X__ Take these medicines the morning of surgery with  A SIP OF WATER:    1. gabapentin (NEURONTIN) 100 MG  2. levothyroxine (SYNTHROID) 88 MCG   3. metoprolol tartrate (LOPRESSOR) 50 MG   4. cetirizine (ZYRTEC) 10 MG tablet  5.  6.  ____ Fleet Enema (as directed)   __X__ Use CHG Soap (or wipes) as directed  ____ Use Benzoyl Peroxide Gel as instructed  __X__ Use inhalers on the day of surgery (bring inhalers to hsoipital   Albuterol Sulfate 108 (90 Base) MCG/ACT AEPB  SYMBICORT 160-4.5 MCG/ACT inhaler  __X__ Stop metFORMIN (GLUCOPHAGE) 500 MG 2 days prior to surgery (Take last dose Tuesday 01/04/21)   __X__ Stop FARXIGA 10 MG TABS 3 days prior to surgery (take last dose Monday 01/03/21)  ____ Take 1/2 of usual insulin dose the night before surgery. No insulin the morning          of surgery.   __X__ Stop ELIQUIS 5 MG TABS 3 days prior to your surgery (take last dose Monday 01/03/21) as instructed by your doctor.  __X__ One Week prior to surgery- Stop Anti-inflammatories such as Ibuprofen, Aleve, Advil, Motrin, meloxicam (MOBIC), diclofenac, etodolac, ketorolac, Toradol, Daypro, piroxicam, Goody's or BC powders. OK TO USE TYLENOL IF NEEDED   __X__ Stop supplements until after surgery.    __X__ Bring C-Pap to the hospital.    If you have any questions regarding your pre-procedure instructions,  Please call Pre-admit Testing at  [phone].

## 2020-12-31 NOTE — Progress Notes (Signed)
Perioperative Services  Pre-Admission/Anesthesia Testing Clinical Review  Date: 12/31/20  Patient Demographics:  Name: Philip SHENK Sr. DOB:   Jan 16, 1946 MRN:   [id number]  Planned Surgical Procedure(s):   Case: [id number] Date/Time: 01/07/21 0700  Procedure: RIGHT L3-4 MICRODISCECTOMY 1 LEVEL (Right)  Anesthesia type: General  Pre-op diagnosis: lumbar radiculopathy m54.16  Location: [facility] OR ROOM 09 / [place] [facility]S FOR ANESTHESIA GROUP  Surgeons: Meade Maw, MD   NOTE: Available PAT nursing documentation and vital signs have been reviewed. Clinical nursing staff has updated patient's PMH/PSHx, current medication list, and drug allergies/intolerances to ensure comprehensive history available to assist in medical decision making as it pertains to the aforementioned surgical procedure and anticipated anesthetic course. Extensive review of available clinical information performed. [place] PMH and PSHx updated with any diagnoses/procedures that  may have been inadvertently omitted during his intake with the pre-admission testing department's nursing staff.  Clinical Discussion:  Philip Figg. is a 75 y.o. male who is submitted for pre-surgical anesthesia review and clearance prior to him undergoing the above procedure. Patient is a Former Smoker (8 pack years; quit 12/1967). Pertinent PMH includes: CAD (s/p CABG), HFpEF, PAF, atrial tachycardia, HTN, HLD, T2DM, hypothyroidism, COPD, asthma, OSAH (no nocturnal PAP therapy), OA, lumbar DDD, HNP of lumbar disc, lumbar stenosis with neurogenic claudication, peripheral edema.  Patient is followed by cardiology Fletcher Anon, MD). He was last seen in the cardiology clinic on 12/01/2020; notes reviewed.  At the time of his clinic visit, patient doing well overall from a cardiovascular perspective.  He denied any episodes of chest pain, shortness breath, PND, orthopnea, palpitations, vertiginous symptoms, or presyncope/syncope.  Patient with  intermittent lower extremity edema, which is chronic for him.  PMH significant for cardiovascular diagnoses.  Diagnostic left heart catheterization was performed on 05/06/2012 revealing a mildly depressed left ventricular systolic function with an EF of 42%.  LVEDP mildly elevated.  There was severe three-vessel CAD noted: 30% LM, 99% ostial LAD, 40% mid LAD, 50% proximal LCx, 90% mid LCx, 20% proximal RCA, 50% mid RCA, and 80% RPDA.  Recommendations were for consultation with CVTS for consideration of CABG procedure.  TTE was performed on 05/06/2012 revealing normal left ventricular systolic function with an EF of 55 to 60%.  Doppler parameters consistent with abnormal left ventricular relaxation (G1DD).  There was trivial pulmonic valve regurgitation.  No evidence of mitral or aortic valve stenosis.  Patient underwent a three-vessel CABG procedure at [facility] on 05/07/2012.  LIMA-LAD, SVG-LCx, and SVG-RPDA bypass grafts were placed.  Long-term cardiac event monitor study performed on 08/24/2015 revealed an underlying sinus rhythm with frequent PACs; minimum heart rate 43 bpm, average heart rate 55 bpm, and maximum heart rate 114 bpm.  There were 18,061 PACs (12% burden).  There were no arrhythmias or pauses lasting >2 seconds.  Myocardial perfusion imaging study performed on 07/28/2019 revealed a normal left ventricular systolic function with an EF of 63%.  There was no evidence of stress-induced myocardial ischemia or arrhythmia.  Study determined to be normal and low risk.  Repeat TTE performed on 09/11/2019 demonstrated a mildly reduced left ventricular systolic function with an EF of 40-45%.  There was global hypokinesis of the left ventricle.  Diastolic parameters indeterminate.  Left atrium moderately dilated.  There was moderate mitral valve and mild to moderate tricuspid valve regurgitation.  Patient with an atrial fibrillation diagnosis. CHA2DS2-VASc Score = 5 (age, HTN, CHF,  vascular disease, T2DM).  He is chronically anticoagulated using  daily apixaban; compliant with therapy with no evidence of GI bleeding.  Blood pressure well controlled at 110/68 on prescribed diuretic, ARB, and beta-blocker therapies.  Patient is on a statin for his HLD.  Patient with suboptimally controlled T2DM; last Hgb A1c was 8.7% when checked on 06/01/2020.  Functional capacity somewhat limited by patient's lower back pain, however he is still felt to be able to achieve at least 4 METS of activity without angina/anginal equivalent symptoms.  No changes were made to his medication regimen.  Patient to follow-up with outpatient cardiology in 3 months or sooner if needed.  Philip Potters Sr. is scheduled to undergo a RIGHT L3-L4 microdiscectomy on 01/07/2021 with Dr. Meade Maw, MD.  Given patient's past medical history significant for cardiovascular diagnoses and intervention, presurgical cardiac clearance was sought by the performing surgeon's office and PAT team. Per cardiology, "based on patient's past medical history and time since his last clinic visit, patient would be at an overall ACCEPTABLE risk for the planned procedure without further cardiovascular testing or intervention at this time".  Again, this patient is on daily anticoagulation therapy.  Discussion between cardiologist and neurosurgeon regarding washout and postoperative hold.  Cardiology noting that extended hold of his apixaban places patient at increased risk for postoperative CVA.  The decision was made to proceed.  Patient has been instructed on recommendations for holding his apixaban for 3 days prior to his procedure with plans to restart postoperatively as directed by his neurosurgeon (10-14 days).  Neurosurgeon to discuss increased CVA risk with patient prior to proceeding.  At this point, the patient has been made aware that his last dose of apixaban will be on 01/03/2021.  Patient denies previous perioperative  complications with anesthesia in the past. In review of the available records, it is noted that patient underwent a MAC anesthetic course here (ASA III) in 09/2017 without documented complications.   Vitals with BMI 12/31/2020 12/01/2020 08/03/2020  Height _0  _1  _2   Weight 196 lbs 201 lbs 6 oz 200 lbs  BMI 27.35 38.4 53.64  Systolic - 680 (No Data)  Diastolic - 68 (No Data)  Pulse - 78 (No Data)    Providers/Specialists:   NOTE: Primary physician provider listed below. Patient may have been seen by APP or partner within same practice.   PROVIDER ROLE / SPECIALTY LAST Dola Factor, MD Neurosurgery 12/16/2020  [name], DO Primary Care Provider 06/10/2020  [name], MD Cardiology 12/01/2020  Wallene Huh, MD Pulmonary Medicine 08/16/2020  Jana Hakim, MD Physiatry 11/22/2020   Allergies:  Bydureon [exenatide], Clindamycin/lincomycin, Penicillins, Sulfa antibiotics, Spiriferis, Spiriva handihaler [tiotropium bromide monohydrate], and Ivp dye [iodinated diagnostic agents]  Current Home Medications:   No current facility-administered medications for this encounter.    acetaminophen (TYLENOL) 500 MG tablet   Albuterol Sulfate 108 (90 Base) MCG/ACT AEPB   ARTIFICIAL TEAR SOLUTION OP   atorvastatin (LIPITOR) 40 MG tablet   calcium carbonate (TUMS - DOSED IN MG ELEMENTAL CALCIUM) 500 MG chewable tablet   cetirizine (ZYRTEC) 10 MG tablet   ELIQUIS 5 MG TABS tablet   FARXIGA 10 MG TABS tablet   fluticasone (FLONASE) 50 MCG/ACT nasal spray   furosemide (LASIX) 40 MG tablet   gabapentin (NEURONTIN) 100 MG capsule   ipratropium-albuterol (DUONEB) 0.5-2.5 (3) MG/3ML SOLN   levothyroxine (SYNTHROID) 88 MCG tablet   losartan (COZAAR) 25 MG tablet   metFORMIN (GLUCOPHAGE) 500 MG tablet   metoprolol tartrate (LOPRESSOR) 50 MG tablet  montelukast (SINGULAIR) 10 MG tablet   spironolactone (ALDACTONE) 25 MG tablet   SYMBICORT 160-4.5 MCG/ACT  inhaler   Blood Glucose Monitoring Suppl (ONE TOUCH ULTRA 2) w/Device KIT   LANCETS ULTRA THIN MISC   ONETOUCH ULTRA test strip   History:   Past Medical History:  Diagnosis Date   (HFpEF) heart failure with preserved ejection fraction ([place])    a. 06/2015 Echo: EF 50-55%, Gr1 DD. Mild MR. Mildly dil LA. Nl RV fxn. Nl PASP.   Arthritis    a. right knee   Asthma    Atrial tachycardia (HCC)    Chronic anticoagulation    Apixaban   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 etiology.   COPD (chronic obstructive pulmonary disease) (HCC)    Coronary artery disease    a. 05/2012 Cath: severe 3VD-->CABG x 3 by Dr. Servando Snare in 01/07/2014[place], SVG->LCX & SVG->RPDA; b. 06/2014 MV: no ischemia/infarct.   DDD (degenerative disc disease), lumbar    Dyspnea    Edema    LEGS/ FEET   Essential hypertension    History of Helicobacter pylori infection    HNP (herniated nucleus pulposus), lumbar    HOH (hard of hearing)    Hypercholesteremia    Hypothyroidism    Low back pain radiating to lower extremity    Lumbar radiculitis    Lumbar stenosis with neurogenic claudication    Neuropathy    PAF (paroxysmal atrial fibrillation) (HCC)    a. CHA2DS2VASc = 5 (age, CHF, HTN, vascular disase, T2DM) --> Eliquis.   Shingles    Sleep apnea    NO CPAP   Type II diabetes mellitus [facility])    Past Surgical History:  Procedure Laterality Date   ANTERIOR CERVICAL DECOMP/DISCECTOMY FUSION  ~ 2009   CARDIAC CATHETERIZATION  05/06/2012   Significant three-vessel coronary artery disease with normal ejection fraction   CATARACT EXTRACTION W/PHACO Right 08/15/2017   Procedure: CATARACT EXTRACTION PHACO AND INTRAOCULAR LENS PLACEMENT ([place]);  Surgeon: Birder Robson, MD;  Location: [facility] ORS;  Service: Ophthalmology;  Laterality: Right;  [place] 00:23 AP% 13.3 CDE 3.11 Fluid pack lot # [id number]   CATARACT EXTRACTION W/PHACO Left 10/03/2017   Procedure: CATARACT EXTRACTION PHACO AND INTRAOCULAR LENS PLACEMENT  (I[facility]);  Surgeon: Birder Robson, MD;  Location: [facility] ORS;  Service: Ophthalmology;  Laterality: Left;  Lot #[id number] H [place]: 00:21.2 AP%:13.9 CDE: 2.96   CORONARY ARTERY BYPASS GRAFT  05/07/2012   Procedure: CORONARY ARTERY BYPASS GRAFTING (CABG);  Surgeon: [name], MD;  Location: [place];  Service: Open Heart Surgery;  Laterality: N/A;  times three   INTRAOPERATIVE TRANSESOPHAGEAL ECHOCARDIOGRAM  05/07/2012   Procedure: INTRAOPERATIVE TRANSESOPHAGEAL ECHOCARDIOGRAM;  Surgeon: [name], MD;  Location: [place];  Service: Open Heart Surgery;  Laterality: N/A;   KNEE ARTHROSCOPY Right ~ 2000   KNEE SURGERY Right 2003   TEE WITHOUT CARDIOVERSION N/A 07/04/2019   Procedure: TRANSESOPHAGEAL ECHOCARDIOGRAM (TEE);  Surgeon: Nelva Bush, MD;  Location: [facility] ORS;  Service: Cardiovascular;  Laterality: N/A;   Family History  Problem Relation Age of Onset   Heart disease Mother    Clotting disorder Mother    Hypertension Sister    Social History   Tobacco Use   Smoking status: Former    Packs/day: 1.00    Years: 8.00    Pack years: 8.00    Types: Cigarettes    Quit date: 12/31/1967    Years since quitting: 53.0   Smokeless tobacco: Former  Vaping Use   Vaping Use: Never used  Substance Use Topics   Alcohol use: No    Alcohol/week: 0.0 standard drinks    Comment: 05/06/2012 "last alcohol several years ago; never had problem wit"   Drug use: No    Pertinent Clinical Results:  LABS: Labs reviewed: Acceptable for surgery.  Hospital Outpatient Visit on 12/31/2020  Component Date Value Ref Range Status   aPTT 12/31/2020 36  24 - 36 seconds Final   Performed at [facility], [address] [place], [place] [address]   WBC 12/31/2020 8.2  4.0 - 10.5 K/uL Final   RBC 12/31/2020 5.18  4.22 - 5.81 MIL/uL Final   Hemoglobin 12/31/2020 15.3  13.0 - 17.0 g/dL Final   HCT 12/31/2020 45.9  39.0 - 52.0 % Final   MCV 12/31/2020 88.6  80.0 - 100.0 fL Final   MCH  12/31/2020 29.5  26.0 - 34.0 pg Final   MCHC 12/31/2020 33.3  30.0 - 36.0 g/dL Final   RDW 12/31/2020 14.6  11.5 - 15.5 % Final   Platelets 12/31/2020 218  150 - 400 K/uL Final   nRBC 12/31/2020 0.0  0.0 - 0.2 % Final   Performed at [facility], [address] [place], [place] [address]   Prothrombin Time 12/31/2020 15.7 (A) 11.4 - 15.2 seconds Final   INR 12/31/2020 1.3 (A) 0.8 - 1.2 Final   Comment: (NOTE) INR goal varies based on device and disease states. Performed at [facility], [address] [place], [place] [address]    MRSA, PCR 12/31/2020 NEGATIVE  NEGATIVE Final   Staphylococcus aureus 12/31/2020 NEGATIVE  NEGATIVE Final   Comment: (NOTE) The Xpert SA Assay (FDA approved for NASAL specimens in patients 61 years of age and older), is one component of a comprehensive surveillance program. It is not intended to diagnose infection nor to guide or monitor treatment. Performed at [facility], [address] [place], [place] [address]     ECG: Date: 12/01/2020 Time ECG obtained: 1017 AM Rate: 78 bpm Rhythm: atrial fibrillation Axis (leads I and aVF): Normal Intervals: QRS 88 ms. QTc 456 ms. ST segment and T wave changes: No evidence of acute ST segment elevation or depression Comparison: Similar to previous tracing obtained on 05/31/2020   IMAGING / PROCEDURES: TRANSTHORACIC ECHOCARDIOGRAM performed on 09/11/2019 Left ventricular ejection fraction, by estimation, is 40 to 45%. The  left ventricle has mildly decreased function. The left ventricle  demonstrates global hypokinesis. Left ventricular diastolic parameters are  indeterminate.   2. Right ventricular systolic function is normal. The right ventricular  size is normal. Tricuspid regurgitation signal is inadequate for assessing  PA pressure.   3. Left atrial size was moderately dilated.   4. The mitral valve is normal in structure. Moderate mitral valve  regurgitation. No  evidence of mitral stenosis.   5. Tricuspid valve regurgitation is mild to moderate.   LEXISCAN performed on 07/28/2019 Normal left ventricular systolic function with an EF of 63%  No evidence of stress-induced myocardial ischemia or arrhythmia  Normal low risk study   TRANSESOPHAGEAL ECHOCARDIOGRAM performed on 07/04/2019 Left ventricular ejection fraction, by estimation, is 30 to 35%. The left ventricle has moderately decreased function. The left ventricle demonstrates global hypokinesis.  Right ventricular systolic function is mildly reduced. The right ventricular size is mildly enlarged.  No left atrial/left atrial appendage thrombus was detected.  The mitral valve is degenerative. Mild to moderate mitral valve regurgitation. No evidence of  mitral stenosis.  The aortic valve is tricuspid. Aortic valve regurgitation is not visualized. Mild aortic valve sclerosis is present, with no evidence of aortic valve stenosis.  There is mild (Grade II) plaque involving the descending aorta.   LONG TERM CARDIAC EVENT MONITOR STUDY performed on 08/24/2015 Predominant underlying normal sinus rhythm with frequent PACs 18,061 PACs in 48 hours representing 12% burden Lowest heart rate was 43 bpm with overall no pauses >4 2 seconds  CORONARY ARTERY BYPASS GRAFTING performed on 05/07/2012 Three-vessel CABG procedure LIMA-LAD SVG-LCx SVG-RPDA  LEFT HEART CATHETERIZATION AND CORONARY ANGIOGRAPHY performed on 05/06/2012 Mildly depressed left ventricular systolic function with an EF of 42% LVEDP mildly elevated There was severe three-vessel CAD  30% LM 99% ostial LAD 40% mid LAD 50% proximal LCx 90% mid LCx 20% proximal RCA 50% mid RCA 80% RPDA.  Recommendations: consultation with CVTS for consideration of CABG procedure  Impression and Plan:  Philip Potters Sr. has been referred for pre-anesthesia review and clearance prior to him undergoing the planned anesthetic and procedural courses.  Available labs, pertinent testing, and imaging results were personally reviewed by me. This patient has been appropriately cleared by cardiology with an overall ACCEPTABLE risk of significant perioperative cardiovascular complications.  Based on clinical review performed today (12/31/20), barring any significant acute changes in the patient's overall condition, it is anticipated that he will be able to proceed with the planned surgical intervention. Any acute changes in clinical condition may necessitate his procedure being postponed and/or cancelled. Patient will meet with anesthesia team (MD and/or CRNA) on the day of his procedure for preoperative evaluation/assessment. Questions regarding anesthetic course will be fielded at that time.   Pre-surgical instructions were reviewed with the patient during his PAT appointment and questions were fielded by PAT clinical staff. Patient was advised that if any questions or concerns arise prior to his procedure then he should return a call to PAT and/or his surgeon's office to discuss.  Honor Loh, MSN, APRN, FNP-C, CEN [facility]  Peri-operative Services Nurse Practitioner Phone: [phone] Fax: [phone] 12/31/20 5:36 PM  NOTE: This note has been prepared using Dragon dictation software. Despite my best ability to proofread, there is always the potential that unintentional transcriptional errors may still occur from this process.

## 2021-01-04 ENCOUNTER — Other Ambulatory Visit: Payer: Medicare Other

## 2021-01-06 ENCOUNTER — Telehealth: Payer: Self-pay | Admitting: Pharmacy Technician

## 2021-01-06 DIAGNOSIS — Z596 Low income: Secondary | ICD-10-CM

## 2021-01-06 NOTE — Progress Notes (Signed)
[facility])                                            [place] Team    01/06/2021  Philip JASKOT Sr. [birth year] [id number]  Care coordination call placed to [employer] in regards to Eliquis application.  Spoke to [place] who informed that an application was received on 12/28/20 but was missing number of people in household, income amount and provider's portion of application.  Successful outreach call placed to patient, HIPAA verified.  Patient informed he had initially mailed the application to [employer] but that it was returned to him. He informed he then took the application to the office and they faxed it into [employer].  Informed patient that I had outreached [employer] this morning and could take care of the missing information but would need to know the household income amount and get permission to add that to the application.Patient provided me with the missing information for number of people in household and household income as well as permission to add that to the application. Informed patient that I would refax all missing pieces of information into [employer]MS and would follow up with him once a determination is received. Patient verbalized understanding.  Re faxed [employer]MS the missing information as indicated above as well as provider's portion of application.  [name], [place]  [phone]

## 2021-01-07 ENCOUNTER — Telehealth: Payer: Self-pay | Admitting: *Deleted

## 2021-01-07 ENCOUNTER — Ambulatory Visit: Payer: Medicare Other | Admitting: Urgent Care

## 2021-01-07 ENCOUNTER — Ambulatory Visit
Admission: RE | Admit: 2021-01-07 | Discharge: 2021-01-07 | Disposition: A | Discharge status: Home / Self Care | Payer: Medicare Other | Attending: Neurosurgery | Admitting: Neurosurgery

## 2021-01-07 ENCOUNTER — Encounter
Admission: RE | Disposition: A | Discharge status: Home / Self Care | Payer: Self-pay | Source: Home / Self Care | Attending: Neurosurgery

## 2021-01-07 ENCOUNTER — Other Ambulatory Visit: Payer: Self-pay

## 2021-01-07 ENCOUNTER — Ambulatory Visit: Payer: Medicare Other

## 2021-01-07 ENCOUNTER — Encounter: Payer: Self-pay | Admitting: Neurosurgery

## 2021-01-07 DIAGNOSIS — Z79890 Hormone replacement therapy (postmenopausal): Secondary | ICD-10-CM | POA: Diagnosis not present

## 2021-01-07 DIAGNOSIS — Z86010 Personal history of colonic polyps: Secondary | ICD-10-CM | POA: Insufficient documentation

## 2021-01-07 DIAGNOSIS — G4733 Obstructive sleep apnea (adult) (pediatric): Secondary | ICD-10-CM | POA: Diagnosis not present

## 2021-01-07 DIAGNOSIS — Z7984 Long term (current) use of oral hypoglycemic drugs: Secondary | ICD-10-CM | POA: Insufficient documentation

## 2021-01-07 DIAGNOSIS — Z888 Allergy status to other drugs, medicaments and biological substances status: Secondary | ICD-10-CM | POA: Diagnosis not present

## 2021-01-07 DIAGNOSIS — Z882 Allergy status to sulfonamides status: Secondary | ICD-10-CM | POA: Diagnosis not present

## 2021-01-07 DIAGNOSIS — M5416 Radiculopathy, lumbar region: Secondary | ICD-10-CM | POA: Insufficient documentation

## 2021-01-07 DIAGNOSIS — Z7901 Long term (current) use of anticoagulants: Secondary | ICD-10-CM | POA: Diagnosis not present

## 2021-01-07 DIAGNOSIS — Z8619 Personal history of other infectious and parasitic diseases: Secondary | ICD-10-CM | POA: Insufficient documentation

## 2021-01-07 DIAGNOSIS — E039 Hypothyroidism, unspecified: Secondary | ICD-10-CM | POA: Insufficient documentation

## 2021-01-07 DIAGNOSIS — I4891 Unspecified atrial fibrillation: Secondary | ICD-10-CM | POA: Insufficient documentation

## 2021-01-07 DIAGNOSIS — Z951 Presence of aortocoronary bypass graft: Secondary | ICD-10-CM | POA: Diagnosis not present

## 2021-01-07 DIAGNOSIS — Z91041 Radiographic dye allergy status: Secondary | ICD-10-CM | POA: Diagnosis not present

## 2021-01-07 DIAGNOSIS — M5126 Other intervertebral disc displacement, lumbar region: Secondary | ICD-10-CM | POA: Diagnosis not present

## 2021-01-07 DIAGNOSIS — E1142 Type 2 diabetes mellitus with diabetic polyneuropathy: Secondary | ICD-10-CM | POA: Insufficient documentation

## 2021-01-07 DIAGNOSIS — Z87891 Personal history of nicotine dependence: Secondary | ICD-10-CM | POA: Diagnosis not present

## 2021-01-07 DIAGNOSIS — M545 Low back pain, unspecified: Secondary | ICD-10-CM

## 2021-01-07 DIAGNOSIS — Z7951 Long term (current) use of inhaled steroids: Secondary | ICD-10-CM | POA: Diagnosis not present

## 2021-01-07 DIAGNOSIS — E7800 Pure hypercholesterolemia, unspecified: Secondary | ICD-10-CM | POA: Insufficient documentation

## 2021-01-07 DIAGNOSIS — I2510 Atherosclerotic heart disease of native coronary artery without angina pectoris: Secondary | ICD-10-CM | POA: Diagnosis not present

## 2021-01-07 DIAGNOSIS — J439 Emphysema, unspecified: Secondary | ICD-10-CM | POA: Insufficient documentation

## 2021-01-07 DIAGNOSIS — Z79899 Other long term (current) drug therapy: Secondary | ICD-10-CM | POA: Diagnosis not present

## 2021-01-07 DIAGNOSIS — I5030 Unspecified diastolic (congestive) heart failure: Secondary | ICD-10-CM | POA: Diagnosis not present

## 2021-01-07 DIAGNOSIS — Z8249 Family history of ischemic heart disease and other diseases of the circulatory system: Secondary | ICD-10-CM | POA: Insufficient documentation

## 2021-01-07 DIAGNOSIS — Z880 Allergy status to penicillin: Secondary | ICD-10-CM | POA: Insufficient documentation

## 2021-01-07 DIAGNOSIS — I252 Old myocardial infarction: Secondary | ICD-10-CM | POA: Diagnosis not present

## 2021-01-07 HISTORY — PX: LUMBAR LAMINECTOMY/DECOMPRESSION MICRODISCECTOMY: SHX5026

## 2021-01-07 HISTORY — DX: Long term (current) use of anticoagulants: Z79.01

## 2021-01-07 HISTORY — DX: Other intervertebral disc degeneration, lumbar region without mention of lumbar back pain or lower extremity pain: M51.369

## 2021-01-07 HISTORY — DX: Radiculopathy, lumbar region: M54.16

## 2021-01-07 HISTORY — DX: Pain in leg, unspecified: M79.606

## 2021-01-07 HISTORY — DX: Other intervertebral disc degeneration, lumbar region: M51.36

## 2021-01-07 HISTORY — DX: Personal history of other infectious and parasitic diseases: Z86.19

## 2021-01-07 HISTORY — DX: Other intervertebral disc displacement, lumbar region: M51.26

## 2021-01-07 HISTORY — DX: Spinal stenosis, lumbar region with neurogenic claudication: M48.062

## 2021-01-07 HISTORY — DX: Unspecified diastolic (congestive) heart failure: I50.30

## 2021-01-07 HISTORY — DX: Low back pain, unspecified: M54.50

## 2021-01-07 LAB — GLUCOSE, CAPILLARY
Glucose-Capillary: 130 mg/dL — ABNORMAL HIGH (ref 70–99)
Glucose-Capillary: 110 mg/dL — ABNORMAL HIGH (ref 70–99)

## 2021-01-07 SURGERY — LUMBAR LAMINECTOMY/DECOMPRESSION MICRODISCECTOMY 1 LEVEL
Anesthesia: General | Laterality: Right

## 2021-01-07 MED ORDER — HEMOSTATIC AGENTS (NO CHARGE) OPTIME
TOPICAL | Status: DC | PRN
  Administered 2021-01-07: 1 via TOPICAL

## 2021-01-07 MED ORDER — CHLORHEXIDINE GLUCONATE 0.12 % MT SOLN
OROMUCOSAL | Status: AC
  Administered 2021-01-07: 15 mL via OROMUCOSAL
  Filled 2021-01-07: qty 15

## 2021-01-07 MED ORDER — METHYLPREDNISOLONE ACETATE 40 MG/ML IJ SUSP
INTRAMUSCULAR | Status: AC
  Filled 2021-01-07: qty 1

## 2021-01-07 MED ORDER — BUPIVACAINE HCL (PF) 0.5 % IJ SOLN
INTRAMUSCULAR | Status: AC
  Filled 2021-01-07: qty 30

## 2021-01-07 MED ORDER — ACETAMINOPHEN 10 MG/ML IV SOLN
INTRAVENOUS | Status: AC
  Filled 2021-01-07: qty 100

## 2021-01-07 MED ORDER — SUGAMMADEX SODIUM 200 MG/2ML IV SOLN
INTRAVENOUS | Status: DC | PRN
  Administered 2021-01-07: 177.8 mg via INTRAVENOUS

## 2021-01-07 MED ORDER — OXYCODONE HCL 5 MG PO TABS: 5.0000 mg | ORAL_TABLET | Freq: Four times a day (QID) | ORAL | 0 refills | Status: AC | PRN

## 2021-01-07 MED ORDER — METHOCARBAMOL 500 MG PO TABS: 500.0000 mg | ORAL_TABLET | Freq: Four times a day (QID) | ORAL | 0 refills | Status: DC

## 2021-01-07 MED ORDER — 0.9 % SODIUM CHLORIDE (POUR BTL) OPTIME
TOPICAL | Status: DC | PRN
  Administered 2021-01-07: 100 mL

## 2021-01-07 MED ORDER — PHENYLEPHRINE HCL (PRESSORS) 10 MG/ML IV SOLN
INTRAVENOUS | Status: DC | PRN
  Administered 2021-01-07: 3556 ug via INTRAVENOUS
  Administered 2021-01-07: 4445 ug via INTRAVENOUS

## 2021-01-07 MED ORDER — FAMOTIDINE 20 MG PO TABS
ORAL_TABLET | ORAL | Status: AC
  Administered 2021-01-07: 20 mg via ORAL
  Filled 2021-01-07: qty 1

## 2021-01-07 MED ORDER — BUPIVACAINE-EPINEPHRINE (PF) 0.5% -1:200000 IJ SOLN
INTRAMUSCULAR | Status: DC | PRN
  Administered 2021-01-07: 2 mL

## 2021-01-07 MED ORDER — PENTAFLUOROPROP-TETRAFLUOROETH EX AERO
INHALATION_SPRAY | CUTANEOUS | Status: AC
  Filled 2021-01-07: qty 30

## 2021-01-07 MED ORDER — OXYCODONE HCL 5 MG PO TABS: 5.0000 mg | ORAL_TABLET | Freq: Once | ORAL | Status: DC | PRN

## 2021-01-07 MED ORDER — LIDOCAINE HCL (CARDIAC) PF 100 MG/5ML IV SOSY
PREFILLED_SYRINGE | INTRAVENOUS | Status: DC | PRN
  Administered 2021-01-07: 60 mg via INTRAVENOUS

## 2021-01-07 MED ORDER — SODIUM CHLORIDE (PF) 0.9 % IJ SOLN
INTRAMUSCULAR | Status: DC | PRN
  Administered 2021-01-07: 60 mL

## 2021-01-07 MED ORDER — FENTANYL CITRATE (PF) 100 MCG/2ML IJ SOLN
INTRAMUSCULAR | Status: AC
  Filled 2021-01-07: qty 2

## 2021-01-07 MED ORDER — PROPOFOL 10 MG/ML IV BOLUS
INTRAVENOUS | Status: DC | PRN
  Administered 2021-01-07: 150 mg via INTRAVENOUS

## 2021-01-07 MED ORDER — SODIUM CHLORIDE 0.9 % IV SOLN: INTRAVENOUS | Status: DC

## 2021-01-07 MED ORDER — FENTANYL CITRATE (PF) 100 MCG/2ML IJ SOLN
25.0000 ug | INTRAMUSCULAR | Status: DC | PRN
  Administered 2021-01-07: 25 ug via INTRAVENOUS
  Administered 2021-01-07: 50 ug via INTRAVENOUS

## 2021-01-07 MED ORDER — REMIFENTANIL HCL 1 MG IV SOLR
INTRAVENOUS | Status: DC | PRN
  Administered 2021-01-07: .07 ug/kg/min via INTRAVENOUS

## 2021-01-07 MED ORDER — ORAL CARE MOUTH RINSE: 15.0000 mL | Freq: Once | OROMUCOSAL | Status: AC

## 2021-01-07 MED ORDER — THROMBIN 5000 UNITS EX SOLR
CUTANEOUS | Status: DC | PRN
  Administered 2021-01-07: 5000 [IU] via TOPICAL

## 2021-01-07 MED ORDER — FAMOTIDINE 20 MG PO TABS: 20.0000 mg | ORAL_TABLET | Freq: Once | ORAL | Status: AC

## 2021-01-07 MED ORDER — ACETAMINOPHEN 10 MG/ML IV SOLN
INTRAVENOUS | Status: DC | PRN
  Administered 2021-01-07: 1000 mg via INTRAVENOUS

## 2021-01-07 MED ORDER — FENTANYL CITRATE (PF) 100 MCG/2ML IJ SOLN
INTRAMUSCULAR | Status: DC | PRN
  Administered 2021-01-07: 100 ug via INTRAVENOUS

## 2021-01-07 MED ORDER — CEFAZOLIN SODIUM-DEXTROSE 2-4 GM/100ML-% IV SOLN
INTRAVENOUS | Status: AC
  Filled 2021-01-07: qty 100

## 2021-01-07 MED ORDER — CHLORHEXIDINE GLUCONATE 0.12 % MT SOLN: 15.0000 mL | Freq: Once | OROMUCOSAL | Status: AC

## 2021-01-07 MED ORDER — PHENYLEPHRINE HCL (PRESSORS) 10 MG/ML IV SOLN
INTRAVENOUS | Status: DC | PRN
  Administered 2021-01-07: 100 ug via INTRAVENOUS

## 2021-01-07 MED ORDER — CEFAZOLIN SODIUM-DEXTROSE 2-4 GM/100ML-% IV SOLN
2.0000 g | INTRAVENOUS | Status: AC
  Administered 2021-01-07: 2 g via INTRAVENOUS

## 2021-01-07 MED ORDER — THROMBIN 5000 UNITS EX SOLR
CUTANEOUS | Status: AC
  Filled 2021-01-07: qty 5000

## 2021-01-07 MED ORDER — BUPIVACAINE LIPOSOME 1.3 % IJ SUSP
INTRAMUSCULAR | Status: AC
  Filled 2021-01-07: qty 20

## 2021-01-07 MED ORDER — BUPIVACAINE-EPINEPHRINE (PF) 0.5% -1:200000 IJ SOLN
INTRAMUSCULAR | Status: AC
  Filled 2021-01-07: qty 30

## 2021-01-07 MED ORDER — ROCURONIUM BROMIDE 100 MG/10ML IV SOLN
INTRAVENOUS | Status: DC | PRN
  Administered 2021-01-07: 50 mg via INTRAVENOUS

## 2021-01-07 MED ORDER — PHENYLEPHRINE HCL (PRESSORS) 10 MG/ML IV SOLN
INTRAVENOUS | Status: AC
  Filled 2021-01-07: qty 1

## 2021-01-07 MED ORDER — ONDANSETRON HCL 4 MG/2ML IJ SOLN
INTRAMUSCULAR | Status: DC | PRN
  Administered 2021-01-07: 4 mg via INTRAVENOUS

## 2021-01-07 MED ORDER — DEXAMETHASONE SODIUM PHOSPHATE 10 MG/ML IJ SOLN
INTRAMUSCULAR | Status: DC | PRN
  Administered 2021-01-07: 10 mg via INTRAVENOUS

## 2021-01-07 MED ORDER — LACTATED RINGERS IV SOLN: INTRAVENOUS | Status: DC | PRN

## 2021-01-07 MED ORDER — REMIFENTANIL HCL 1 MG IV SOLR
INTRAVENOUS | Status: AC
  Filled 2021-01-07: qty 1000

## 2021-01-07 MED ORDER — SENNA 8.6 MG PO TABS: 1.0000 | ORAL_TABLET | Freq: Every day | ORAL | 0 refills | Status: DC | PRN

## 2021-01-07 MED ORDER — SODIUM CHLORIDE FLUSH 0.9 % IV SOLN
INTRAVENOUS | Status: AC
  Filled 2021-01-07: qty 20

## 2021-01-07 MED ORDER — OXYCODONE HCL 5 MG/5ML PO SOLN
5.0000 mg | Freq: Once | ORAL | Status: DC | PRN
Start: 2021-01-07 — End: 2021-01-07

## 2021-01-07 MED ORDER — METHYLPREDNISOLONE ACETATE 40 MG/ML IJ SUSP
INTRAMUSCULAR | Status: DC | PRN
  Administered 2021-01-07: 40 mg

## 2021-01-07 MED ORDER — SEVOFLURANE IN SOLN
RESPIRATORY_TRACT | Status: AC
  Filled 2021-01-07: qty 250

## 2021-01-07 SURGICAL SUPPLY — 54 items
BUR NEURO DRILL SOFT 3.0X3.8M (BURR) ×2 IMPLANT
CHLORAPREP W/TINT 26 (MISCELLANEOUS) ×4 IMPLANT
CNTNR SPEC 2.5X3XGRAD LEK (MISCELLANEOUS) ×1
CONT SPEC 4OZ STER OR WHT (MISCELLANEOUS) ×1
CONTAINER SPEC 2.5X3XGRAD LEK (MISCELLANEOUS) ×1 IMPLANT
COUNTER NEEDLE 20/40 LG (NEEDLE) ×2 IMPLANT
CUP MEDICINE 2OZ PLAST GRAD ST (MISCELLANEOUS) ×4 IMPLANT
DERMABOND ADVANCED (GAUZE/BANDAGES/DRESSINGS) ×1
DERMABOND ADVANCED .7 DNX12 (GAUZE/BANDAGES/DRESSINGS) ×1 IMPLANT
DRAPE C ARM PK CFD 31 SPINE (DRAPES) ×2 IMPLANT
DRAPE LAPAROTOMY 100X77 ABD (DRAPES) ×2 IMPLANT
DRAPE MICROSCOPE SPINE 48X150 (DRAPES) ×2 IMPLANT
DRAPE SURG 17X11 SM STRL (DRAPES) ×8 IMPLANT
DRSG OPSITE POSTOP 3X4 (GAUZE/BANDAGES/DRESSINGS) ×1 IMPLANT
ELECT CAUTERY BLADE TIP 2.5 (TIP) ×2
ELECT EZSTD 165MM 6.5IN (MISCELLANEOUS)
ELECT REM PT RETURN 9FT ADLT (ELECTROSURGICAL) ×2
ELECTRODE CAUTERY BLDE TIP 2.5 (TIP) ×1 IMPLANT
ELECTRODE EZSTD 165MM 6.5IN (MISCELLANEOUS) IMPLANT
ELECTRODE REM PT RTRN 9FT ADLT (ELECTROSURGICAL) ×1 IMPLANT
GAUZE 4X4 16PLY ~~LOC~~+RFID DBL (SPONGE) ×2 IMPLANT
GLOVE SURG SYN 6.5 ES PF (GLOVE) ×4 IMPLANT
GLOVE SURG SYN 6.5 PF PI (GLOVE) ×2 IMPLANT
GLOVE SURG SYN 8.5  E (GLOVE) ×3
GLOVE SURG SYN 8.5 E (GLOVE) ×3 IMPLANT
GLOVE SURG SYN 8.5 PF PI (GLOVE) ×3 IMPLANT
GLOVE SURG UNDER POLY LF SZ6.5 (GLOVE) ×2 IMPLANT
GOWN SRG LRG LVL 4 IMPRV REINF (GOWNS) ×1 IMPLANT
GOWN SRG XL LVL 3 NONREINFORCE (GOWNS) ×1 IMPLANT
GOWN STRL NON-REIN TWL XL LVL3 (GOWNS) ×1
GOWN STRL REIN LRG LVL4 (GOWNS) ×1
GRADUATE 1200CC STRL 31836 (MISCELLANEOUS) ×2 IMPLANT
GRAFT DURAGEN MATRIX 1WX1L (Tissue) IMPLANT
KIT SPINAL PRONEVIEW (KITS) ×2 IMPLANT
MANIFOLD NEPTUNE II (INSTRUMENTS) ×2 IMPLANT
MARKER SKIN DUAL TIP RULER LAB (MISCELLANEOUS) ×3 IMPLANT
NDL SAFETY ECLIPSE 18X1.5 (NEEDLE) ×1 IMPLANT
NEEDLE HYPO 18GX1.5 SHARP (NEEDLE) ×1
NEEDLE HYPO 22GX1.5 SAFETY (NEEDLE) ×2 IMPLANT
NS IRRIG 1000ML POUR BTL (IV SOLUTION) ×2 IMPLANT
PACK LAMINECTOMY NEURO (CUSTOM PROCEDURE TRAY) ×2 IMPLANT
PAD ARMBOARD 7.5X6 YLW CONV (MISCELLANEOUS) ×2 IMPLANT
SPOGE SURGIFLO 8M (HEMOSTASIS) ×1
SPONGE SURGIFLO 8M (HEMOSTASIS) ×1 IMPLANT
SUT DVC VLOC 3-0 CL 6 P-12 (SUTURE) ×2 IMPLANT
SUT VIC AB 0 CT1 27 (SUTURE) ×1
SUT VIC AB 0 CT1 27XCR 8 STRN (SUTURE) ×1 IMPLANT
SUT VIC AB 2-0 CT1 18 (SUTURE) ×2 IMPLANT
SYR 10ML LL (SYRINGE) ×2 IMPLANT
SYR 20ML LL LF (SYRINGE) ×2 IMPLANT
SYR 30ML LL (SYRINGE) ×4 IMPLANT
SYR 3ML LL SCALE MARK (SYRINGE) ×2 IMPLANT
TOWEL OR 17X26 4PK STRL BLUE (TOWEL DISPOSABLE) ×6 IMPLANT
TUBING CONNECTING 10 (TUBING) ×2 IMPLANT

## 2021-01-07 NOTE — Anesthesia Procedure Notes (Signed)
Procedure Name: Intubation Date/Time: 01/07/2021 7:20 AM Performed by: Willette Alma, CRNA Pre-anesthesia Checklist: Patient identified, Patient being monitored, Timeout performed, Emergency Drugs available and Suction available Patient Re-evaluated:Patient Re-evaluated prior to induction Oxygen Delivery Method: Circle system utilized Preoxygenation: Pre-oxygenation with 100% oxygen Induction Type: IV induction Ventilation: Mask ventilation without difficulty Laryngoscope Size: 4 and McGraph Grade View: Grade I Tube type: Oral Tube size: 7.5 mm Number of attempts: 1 Airway Equipment and Method: Stylet Placement Confirmation: ETT inserted through vocal cords under direct vision, positive ETCO2 and breath sounds checked- equal and bilateral Secured at: 21 cm Tube secured with: Tape Dental Injury: Teeth and Oropharynx as per pre-operative assessment

## 2021-01-07 NOTE — Op Note (Signed)
Indications: Philip [name] is a 75 yo male who presents with lumbar radiculopathy.  He failed conservative management and elected for surgical intervention  Findings: disc herniation L3-4  Preoperative Diagnosis: Lumbar radiculopathy Postoperative Diagnosis: same   EBL: 50 ml IVF: 500 ml Drains: none Disposition: Extubated and Stable to PACU Complications: none  No foley catheter was placed.   Preoperative Note:   Risks of surgery discussed include: infection, bleeding, stroke, coma, death, paralysis, CSF leak, nerve/spinal cord injury, numbness, tingling, weakness, complex regional pain syndrome, recurrent stenosis and/or disc herniation, vascular injury, development of instability, neck/back pain, need for further surgery, persistent symptoms, development of deformity, and the risks of anesthesia. The patient understood these risks and agreed to proceed.  Operative Note:   1) Right L3/4 microdiscectomy  The patient was then brought from the preoperative center with intravenous access established.  The patient underwent general anesthesia and endotracheal tube intubation, and was then rotated on the [place] rail top where all pressure points were appropriately padded.  The skin was then thoroughly cleansed.  Perioperative antibiotic prophylaxis was administered.  Sterile prep and drapes were then applied and a timeout was then observed.  C-arm was brought into the field under sterile conditions, and the L3-4 disc space identified and marked with an incision on the right 1cm lateral to midline.  Once this was complete a 2 cm incision was opened with the use of a #10 blade knife.  The Metrx tubes were sequentially advanced under lateral fluoroscopy until a 18 x 50 mm Metrx tube was placed over the facet and lamina and secured to the bed.    The microscope was then sterilely brought into the field and muscle creep was hemostased with a bipolar and resected with a pituitary rongeur.  A Bovie  extender was then used to expose the spinous process and lamina.  Careful attention was placed to not violate the facet capsule. A 3 mm matchstick drill bit was then used to make a hemi-laminotomy trough until the ligamentum flavum was exposed.  This was extended to the base of the spinous process.  Once this was complete and the underlying ligamentum flavum was visualized this was dissected with an up angle curette and resected with a #2 and #3 mm biting Kerrison.  The laminotomy opening was also expanded in similar fashion and hemostasis was obtained with Surgifoam and a patty as well as bone wax.  The rostral aspect of the caudal level of the lamina was also resected with a #2 biting Kerrison effort to further enhance exposure.  Once the underlying dura was visualized a Penfield 4 was then used to dissect and expose the traversing nerve root.  Once this was identified a nerve root retractor suction was used to mobilize this medially.  The venous plexus was hemostased with Surgifoam and light bipolar use.  A small penfield was then used to make a small annulotomy within the disc space and disc space contents were noted to come through the annulus.    The disc herniation was identified and dissected free using a balltip probe. The pituitary rongeur was used to remove the extruded disc fragments. Once the thecal sac and nerve root were noted to be relaxed and under less tension the ball-tipped feeler was passed along the foramen distally to to ensure no residual compression was noted.    Depo-Medrol was placed along the nerve root.  The area was irrigated. The tube system was then removed under microscopic visualization and hemostasis was  obtained with a bipolar.    The fascial layer was reapproximated with the use of a 0- Vicryl suture.  Subcutaneous tissue layer was reapproximated using 2-0 Vicryl suture.  3-0 monocryl was used on the skin. The skin was then cleansed and Dermabond was used to close the skin  opening.  Patient was then rotated back to the preoperative bed awakened from anesthesia and taken to recovery all counts are correct in this case.   I performed the entire procedure with the assistance of Cooper Render PA as an Pensions consultant.  Meade Maw MD

## 2021-01-07 NOTE — Discharge Instructions (Addendum)
Your surgeon has performed an operation on your lumbar spine (low back) to relieve pressure on one or more nerves. Many times, patients feel better immediately after surgery and can "overdo it." Even if you feel well, it is important that you follow these activity guidelines. If you do not let your back heal properly from the surgery, you can increase the chance of a disc herniation and/or return of your symptoms. The following are instructions to help in your recovery once you have been discharged from the hospital.  Please hold home Eliquis for 14 days after surgery.  Activity    No bending, lifting, or twisting ("BLT"). Avoid lifting objects heavier than 10 pounds (gallon milk jug).  Where possible, avoid household activities that involve lifting, bending, pushing, or pulling such as laundry, vacuuming, grocery shopping, and childcare. Try to arrange for help from friends and family for these activities while your back heals.  Increase physical activity slowly as tolerated.  Taking short walks is encouraged, but avoid strenuous exercise. Do not jog, run, bicycle, lift weights, or participate in any other exercises unless specifically allowed by your doctor. Avoid prolonged sitting, including car rides.  Talk to your doctor before resuming sexual activity.  You should not drive until cleared by your doctor.  Until released by your doctor, you should not return to work or school.  You should rest at home and let your body heal.   You may shower two days after your surgery.  After showering, lightly dab your incision dry. Do not take a tub bath or go swimming for 3 weeks, or until approved by your doctor at your follow-up appointment.  If you smoke, we strongly recommend that you quit.  Smoking has been proven to interfere with normal healing in your back and will dramatically reduce the success rate of your surgery. Please contact [facility] (800-QUIT-NOW) and use the resources at  www.[facility].com for assistance in stopping smoking.  Surgical Incision   If you have a dressing on your incision, you may remove it three days after your surgery. Keep your incision area clean and dry.  Your incision was closed with Dermabond glue. The glue should begin to peel away within about a week. Diet            You may return to your usual diet. Be sure to stay hydrated.  When to Contact us  Although your surgery and recovery will likely be uneventful, you may have some residual numbness, aches, and pains in your back and/or legs. This is normal and should improve in the next few weeks.  However, should you experience any of the following, contact us immediately: New numbness or weakness Pain that is progressively getting worse, and is not relieved by your pain medications or rest Bleeding, redness, swelling, pain, or drainage from surgical incision Chills or flu-like symptoms Fever greater than 101.0 F (38.3 C) Problems with bowel or bladder functions Difficulty breathing or shortness of breath Warmth, tenderness, or swelling in your calf  Contact Information During office hours (Monday-Friday 9 am to 5 pm), please call your physician at [phone] After hours and weekends, please call [phone] and speak with the answering service, who will contact the doctor on call.  If that fails, call the [place] Operator at [phone] and ask for the Neurosurgery Resident On Call  For a life-threatening emergency, call [place]   The drugs that you were given will stay in  your system until tomorrow so for the next 24 hours you should not:  Drive an automobile Make any legal decisions Drink any alcoholic beverage   You may resume regular meals tomorrow.  Today it is better to start with liquids and gradually work up to solid foods.  You may eat anything you prefer, but it is better to start with liquids, then soup and  crackers, and gradually work up to solid foods.   Please notify your doctor immediately if you have any unusual bleeding, trouble breathing, redness and pain at the surgery site, drainage, fever, or pain not relieved by medication.    Additional Instructions:        Please contact your physician with any problems or Same Day Surgery at [phone], Monday through Friday 6 am to 4 pm, or [place] [facility] number at [phone].

## 2021-01-07 NOTE — H&P (Signed)
I have reviewed and confirmed my history and physical from 12/16/2020 with no additions or changes. Plan for R L3-4 microdiscectomy.  Risks and benefits reviewed.  Heart sounds normal no MRG. Chest Clear to Auscultation Bilaterally.

## 2021-01-07 NOTE — Telephone Encounter (Signed)
Pt has been approved for Eliquis Pt Assistance. 01/06/2021-04/30/2021.

## 2021-01-07 NOTE — Anesthesia Preprocedure Evaluation (Signed)
Anesthesia Evaluation  Patient identified by MRN, date of birth, ID band Patient awake    Reviewed: Allergy & Precautions, NPO status , Patient's Chart, lab work & pertinent test results  History of Anesthesia Complications Negative for: history of anesthetic complications  Airway Mallampati: III  TM Distance: >3 FB Neck ROM: full    Dental  (+) Edentulous Lower, Edentulous Upper   Pulmonary shortness of breath and with exertion, asthma , sleep apnea , pneumonia, COPD, former smoker,    Pulmonary exam normal        Cardiovascular hypertension, + CAD, + Cardiac Stents and +CHF  Normal cardiovascular exam+ dysrhythmias      Neuro/Psych  Neuromuscular disease negative psych ROS   GI/Hepatic negative GI ROS, Neg liver ROS, neg GERD  ,  Endo/Other  diabetes, Type 2Hypothyroidism   Renal/GU Renal disease     Musculoskeletal  (+) Arthritis ,   Abdominal   Peds  Hematology negative hematology ROS (+)   Anesthesia Other Findings Past Medical History: No date: (HFpEF) heart failure with preserved ejection fraction ([place])     Comment:  a. 06/2015 Echo: EF 50-55%, Gr1 DD. Mild MR. Mildly dil               LA. Nl RV fxn. Nl PASP. No date: Arthritis     Comment:  a. right knee No date: Asthma No date: Atrial tachycardia ([place]) No date: Chronic anticoagulation     Comment:  Apixaban No date: Collapse of right lung     Comment:  a. 12/1967 - ? etiology. No date: COPD (chronic obstructive pulmonary disease) (HCC) No date: Coronary artery disease     Comment:  a. 05/2012 Cath: severe 3VD-->CABG x 3 by Dr. Servando Snare in              01/07/2014[place], SVG->LCX & SVG->RPDA; b. 06/2014               MV: no ischemia/infarct. No date: DDD (degenerative disc disease), lumbar No date: Dyspnea No date: Edema     Comment:  LEGS/ FEET No date: Essential hypertension No date: History of Helicobacter pylori infection No date: HNP  (herniated nucleus pulposus), lumbar No date: HOH (hard of hearing) No date: Hypercholesteremia No date: Hypothyroidism No date: Low back pain radiating to lower extremity No date: Lumbar radiculitis No date: Lumbar stenosis with neurogenic claudication No date: Neuropathy No date: PAF (paroxysmal atrial fibrillation) (HCC)     Comment:  a. CHA2DS2VASc = 5 (age, CHF, HTN, vascular disase,               T2DM) --> Eliquis. No date: Shingles No date: Sleep apnea     Comment:  NO CPAP No date: Type II diabetes mellitus [facility])  Past Surgical History: ~ 2009: ANTERIOR CERVICAL DECOMP/DISCECTOMY FUSION 05/06/2012: CARDIAC CATHETERIZATION     Comment:  Significant three-vessel coronary artery disease with               normal ejection fraction 08/15/2017: CATARACT EXTRACTION W/PHACO; Right     Comment:  Procedure: CATARACT EXTRACTION PHACO AND INTRAOCULAR               LENS PLACEMENT ([place]);  Surgeon: Birder Robson, MD;                Location: [facility] ORS;  Service: Ophthalmology;  Laterality:              Right;  [place] 00:23 AP% 13.3 CDE 3.11 Fluid pack  lot #               [id number] 10/03/2017: CATARACT EXTRACTION W/PHACO; Left     Comment:  Procedure: CATARACT EXTRACTION PHACO AND INTRAOCULAR               LENS PLACEMENT (I[facility]);  Surgeon: Birder Robson, MD;                Location: [facility] ORS;  Service: Ophthalmology;  Laterality:              Left;  Lot [id number] H [place]: 00:21.2 AP%:13.9 CDE: 2.96 05/07/2012: CORONARY ARTERY BYPASS GRAFT     Comment:  Procedure: CORONARY ARTERY BYPASS GRAFTING (CABG);                Surgeon: [name], MD;  Location: No Name;                Service: Open Heart Surgery;  Laterality: N/A;  times               three 05/07/2012: INTRAOPERATIVE TRANSESOPHAGEAL ECHOCARDIOGRAM     Comment:  Procedure: INTRAOPERATIVE TRANSESOPHAGEAL               ECHOCARDIOGRAM;  Surgeon: [name], MD;                Location: [place];  Service: Open Heart  Surgery;                Laterality: N/A; ~ 2000: KNEE ARTHROSCOPY; Right 2003: KNEE SURGERY; Right 07/04/2019: TEE WITHOUT CARDIOVERSION; N/A     Comment:  Procedure: TRANSESOPHAGEAL ECHOCARDIOGRAM (TEE);                Surgeon: Nelva Bush, MD;  Location: [facility] ORS;                Service: Cardiovascular;  Laterality: N/A;  BMI    Body Mass Index: 27.33 kg/m      Reproductive/Obstetrics negative OB ROS                             Anesthesia Physical Anesthesia Plan  ASA: 3  Anesthesia Plan: General ETT   Post-op Pain Management:    Induction: Intravenous  PONV Risk Score and Plan: Ondansetron, Dexamethasone, Midazolam and Treatment may vary due to age or medical condition  Airway Management Planned: Oral ETT  Additional Equipment:   Intra-op Plan:   Post-operative Plan: Extubation in OR  Informed Consent: I have reviewed the patients History and Physical, chart, labs and discussed the procedure including the risks, benefits and alternatives for the proposed anesthesia with the patient or authorized representative who has indicated his/her understanding and acceptance.     Dental Advisory Given  Plan Discussed with: Anesthesiologist, CRNA and Surgeon  Anesthesia Plan Comments: (Patient consented for risks of anesthesia including but not limited to:  - adverse reactions to medications - damage to eyes, teeth, lips or other oral mucosa - nerve damage due to positioning  - sore throat or hoarseness - Damage to heart, brain, nerves, lungs, other parts of body or loss of life  Patient voiced understanding.)        Anesthesia Quick Evaluation

## 2021-01-07 NOTE — Discharge Summary (Signed)
Physician Discharge Summary  Patient ID: Philip NUZUM Sr. MRN: [id number] DOB/AGE: [birth year] 74 y.o.  Admit date: 01/07/2021 Discharge date: 01/07/2021  Admission Diagnoses: Lumbar Radiculopathy   Discharge Diagnoses:  Active Problems:   * No active hospital problems. *   Discharged Condition: good  Hospital Course: Philip [name] is a 75 y.o s/p right L3-4 microdisctomy. His intraoperative course was uncomplicated. He was monitored in PACU and was able to urinate, ambulate and tolerate P.O intake. His pain was controlled and he was discharged home with pain medication, muscle relaxer, and a stool softener.   Consults: None  Significant Diagnostic Studies: none  Treatments: The above surgery  Discharge Exam: Blood pressure 137/77, pulse 75, temperature 98.2 F (36.8 C), temperature source Oral, resp. rate 18, height 5' 11" (1.803 m), weight 88.9 kg, SpO2 99 %. Oriented x 3 Sensation grossly intact in BLE 5/5 strength throughout LLE 4-/5 left HF and KE, 5/5 distally  Disposition: Discharge disposition: 01-Home or Self Care       Discharge Instructions     Diet - low sodium heart healthy   Complete by: As directed       Allergies as of 01/07/2021       Reactions   Bydureon [exenatide] Anaphylaxis   Possible from bydureon, not 100% confirmed   Clindamycin/lincomycin Hives, Itching   Penicillins Hives, Itching, Rash, Other (See Comments)   Has patient had a PCN reaction causing immediate rash, facial/tongue/throat swelling, SOB or lightheadedness with hypotension: Yes Has patient had a PCN reaction causing severe rash involving mucus membranes or skin necrosis: No Has patient had a PCN reaction that required hospitalization: No Has patient had a PCN reaction occurring within the last 10 years: No If all of the above answers are "NO", then may proceed with Cephalosporin use.   Sulfa Antibiotics Shortness Of Breath   Spiriferis Other (See Comments)   Affected  breathing   Spiriva Handihaler [tiotropium Bromide Monohydrate]    Affected breathing   Ivp Dye [iodinated Diagnostic Agents] Rash, Other (See Comments)   Redness        Medication List     STOP taking these medications    Eliquis 5 MG Tabs tablet Generic drug: apixaban       TAKE these medications    acetaminophen 500 MG tablet Commonly known as: TYLENOL Take 1,000-1,500 mg by mouth every 8 (eight) hours as needed for moderate pain.   Albuterol Sulfate 108 (90 Base) MCG/ACT Aepb Commonly known as: PROAIR RESPICLICK Inhale 2 puffs into the lungs every 6 (six) hours as needed (shortness of breath).   ARTIFICIAL TEAR SOLUTION OP Place 1 drop into both eyes daily.   atorvastatin 40 MG tablet Commonly known as: LIPITOR Take 1 tablet (40 mg total) by mouth daily. PLEASE SCHEDULE OFFICE VISIT FOR FURTHER REFILLS. THANK YOU!   calcium carbonate 500 MG chewable tablet Commonly known as: TUMS - dosed in mg elemental calcium Chew 1,000 mg by mouth daily as needed for indigestion or heartburn.   cetirizine 10 MG tablet Commonly known as: ZYRTEC Take 10 mg by mouth daily.   Farxiga 10 MG Tabs tablet Generic drug: dapagliflozin propanediol Take 1 tablet (10 mg total) by mouth daily before breakfast.   fluticasone 50 MCG/ACT nasal spray Commonly known as: FLONASE Place 1 spray into both nostrils daily as needed for allergies or rhinitis.   furosemide 40 MG tablet Commonly known as: LASIX Take 0.5-1 tablets (20-40 mg total) by mouth daily as needed for  edema. Only as needed now.   gabapentin 100 MG capsule Commonly known as: NEURONTIN Take 1 capsule (100 mg total) by mouth 3 (three) times daily.   ipratropium-albuterol 0.5-2.5 (3) MG/3ML Soln Commonly known as: DUONEB Take 3 mLs by nebulization every 6 (six) hours as needed (shortness of breath).   Lancets Ultra Thin Misc 1 Device by Does not apply route 4 (four) times daily -  before meals and at bedtime.    levothyroxine 88 MCG tablet Commonly known as: SYNTHROID TAKE 1 TABLET BY MOUTH ONCE DAILY BEFORE BREAKFAST   losartan 25 MG tablet Commonly known as: COZAAR Take 0.5 tablets (12.5 mg total) by mouth daily. PLEASE SCHEDULE OFFICE VISIT FOR FURTHER REFILLS. THANK YOU!   metFORMIN 500 MG tablet Commonly known as: GLUCOPHAGE TAKE 2 TABLETS BY MOUTH TWICE DAILY WITH A MEAL . APPOINTMENT REQUIRED FOR FUTURE REFILLS   methocarbamol 500 MG tablet Commonly known as: Robaxin Take 1 tablet (500 mg total) by mouth 4 (four) times daily.   metoprolol tartrate 50 MG tablet Commonly known as: LOPRESSOR TAKE 1 & 1/2 (ONE & ONE-HALF) TABLETS BY MOUTH TWICE DAILY . APPOINTMENT REQUIRED FOR FUTURE REFILLS   montelukast 10 MG tablet Commonly known as: SINGULAIR Take 10 mg by mouth at bedtime.   ONE TOUCH ULTRA 2 w/Device Kit Use to check blood sugar as advised up to twice a day   OneTouch Ultra test strip Generic drug: glucose blood USE STRIP TO CHECK GLUCOSE TWICE DAILY   oxyCODONE 5 MG immediate release tablet Commonly known as: Roxicodone Take 1 tablet (5 mg total) by mouth every 6 (six) hours as needed for up to 5 days for severe pain.   senna 8.6 MG Tabs tablet Commonly known as: SENOKOT Take 1 tablet (8.6 mg total) by mouth daily as needed for mild constipation.   spironolactone 25 MG tablet Commonly known as: ALDACTONE Take 0.5 tablets (12.5 mg total) by mouth daily.   Symbicort 160-4.5 MCG/ACT inhaler Generic drug: budesonide-formoterol Inhale 2 puffs 2 (two) times daily into the lungs.        Follow-up Information     Loleta Dicker, PA Follow up in 2 week(s).   Why: for incision check Contact information: [place] [address] [phone]                 Signed: Loleta Dicker 01/07/2021, 9:05 AM

## 2021-01-07 NOTE — Transfer of Care (Signed)
Immediate Anesthesia Transfer of Care Note  Patient: Philip MERGEL Sr.  Procedure(s) Performed: RIGHT L3-4 MICRODISCECTOMY 1 LEVEL (Right)  Patient Location: PACU  Anesthesia Type:General  Level of Consciousness: awake, alert  and oriented  Airway & Oxygen Therapy: Patient Spontanous Breathing and Patient connected to face mask oxygen  Post-op Assessment: Report given to RN and Post -op Vital signs reviewed and stable  Post vital signs: Reviewed and stable  Last Vitals:  Vitals Value Taken Time  BP 151/95 01/07/21 0903  Temp    Pulse 66 01/07/21 0906  Resp 17 01/07/21 0906  SpO2 99 % 01/07/21 0906  Vitals shown include unvalidated device data.  Last Pain:  Vitals:   01/07/21 0634  TempSrc: Oral  PainSc: 0-No pain         Complications: No notable events documented.

## 2021-01-07 NOTE — Anesthesia Postprocedure Evaluation (Signed)
Anesthesia Post Note  Patient: Philip HABTE Sr.  Procedure(s) Performed: RIGHT L3-4 MICRODISCECTOMY 1 LEVEL (Right)  Patient location during evaluation: PACU Anesthesia Type: General Level of consciousness: awake and alert Pain management: pain level controlled Vital Signs Assessment: post-procedure vital signs reviewed and stable Respiratory status: spontaneous breathing, nonlabored ventilation, respiratory function stable and patient connected to nasal cannula oxygen Cardiovascular status: blood pressure returned to baseline and stable Postop Assessment: no apparent nausea or vomiting Anesthetic complications: no   No notable events documented.   Last Vitals:  Vitals:   01/07/21 1003 01/07/21 1030  BP: (!) 136/91 132/79  Pulse: 66 96  Resp: 18 16  Temp: (!) 36 C   SpO2: 96% 96%    Last Pain:  Vitals:   01/07/21 1030  TempSrc:   PainSc: 0-No pain                 Precious Haws Zahirah Cheslock

## 2021-01-07 NOTE — Progress Notes (Signed)
PHARMACY -  BRIEF ANTIBIOTIC NOTE   Pharmacy has received consult(s) for Cefazolin from an OR provider.  The patient's profile has been reviewed for ht/wt/allergies/indication/available labs.    One time order(s) placed for Cefazolin 2 gm preop per pt wt: 88.9 kg.  Further antibiotics/pharmacy consults should be ordered by admitting physician if indicated.                       Thank you, Renda Rolls, PharmD, [facility] 01/07/2021 6:15 AM

## 2021-01-08 ENCOUNTER — Other Ambulatory Visit: Payer: Self-pay | Admitting: Cardiovascular Disease

## 2021-01-08 ENCOUNTER — Other Ambulatory Visit: Payer: Self-pay | Admitting: Family Medicine

## 2021-01-08 DIAGNOSIS — E034 Atrophy of thyroid (acquired): Secondary | ICD-10-CM

## 2021-01-08 NOTE — Telephone Encounter (Signed)
Requested Prescriptions  Pending Prescriptions Disp Refills  . EUTHYROX 88 MCG tablet [Pharmacy Med Name: Euthyrox 88 MCG Oral Tablet] 30 tablet 0    Sig: TAKE 1 TABLET BY MOUTH ONCE DAILY BEFORE BREAKFAST     Endocrinology:  Hypothyroid Agents Failed - 01/08/2021 11:25 AM      Failed - TSH needs to be rechecked within 3 months after an abnormal result. Refill until TSH is due.      Failed - TSH in normal range and within 360 days    TSH  Date Value Ref Range Status  06/30/2019 2.676 0.350 - 4.500 uIU/mL Final    Comment:    Performed by a 3rd Generation assay with a functional sensitivity of <=0.01 uIU/mL. Performed at [facility], [address] [place], [place] [address]   04/17/2019 3.460 0.450 - 4.500 uIU/mL Final         Passed - Valid encounter within last 12 months    Recent Outpatient Visits          7 months ago Type 2 diabetes mellitus with hyperlipidemia [facility])   [place], DO   8 months ago Acute idiopathic gout of right ankle   [place], DO   1 year ago Acute conjunctivitis of both eyes, unspecified acute conjunctivitis type   [place], DO   1 year ago Chronic right hip pain   [place], DO   1 year ago Acute idiopathic gout of right foot   [place], [name], DO      Future Appointments            In 2 months [place], [name], [name] [place], [place]   In 7 months  [facility], [place]

## 2021-01-10 ENCOUNTER — Telehealth: Payer: Self-pay | Admitting: Pharmacy Technician

## 2021-01-10 DIAGNOSIS — I480 Paroxysmal atrial fibrillation: Secondary | ICD-10-CM | POA: Diagnosis not present

## 2021-01-10 DIAGNOSIS — R2681 Unsteadiness on feet: Secondary | ICD-10-CM | POA: Diagnosis not present

## 2021-01-10 DIAGNOSIS — I130 Hypertensive heart and chronic kidney disease with heart failure and stage 1 through stage 4 chronic kidney disease, or unspecified chronic kidney disease: Secondary | ICD-10-CM | POA: Diagnosis not present

## 2021-01-10 DIAGNOSIS — J441 Chronic obstructive pulmonary disease with (acute) exacerbation: Secondary | ICD-10-CM | POA: Diagnosis not present

## 2021-01-10 DIAGNOSIS — E1169 Type 2 diabetes mellitus with other specified complication: Secondary | ICD-10-CM | POA: Diagnosis not present

## 2021-01-10 DIAGNOSIS — I5022 Chronic systolic (congestive) heart failure: Secondary | ICD-10-CM | POA: Diagnosis not present

## 2021-01-10 DIAGNOSIS — M5441 Lumbago with sciatica, right side: Secondary | ICD-10-CM | POA: Diagnosis not present

## 2021-01-10 DIAGNOSIS — Z4789 Encounter for other orthopedic aftercare: Secondary | ICD-10-CM | POA: Diagnosis not present

## 2021-01-10 DIAGNOSIS — N182 Chronic kidney disease, stage 2 (mild): Secondary | ICD-10-CM | POA: Diagnosis not present

## 2021-01-10 DIAGNOSIS — E785 Hyperlipidemia, unspecified: Secondary | ICD-10-CM | POA: Diagnosis not present

## 2021-01-10 DIAGNOSIS — Z596 Low income: Secondary | ICD-10-CM

## 2021-01-10 NOTE — Progress Notes (Signed)
[facility])                                            [place] Team    01/10/2021  BRAE GODOWN Sr. 22-Jan-1946 [id number]  Care coordination call placed to [facility] in regards to Eliquis application.  Spoke to [place] who informed patient was APPROVED 01/06/21-04/30/21. Medication would be delivered to patient's home.  Anias Bartol P. Ashten Prats, [place]  [phone]

## 2021-01-12 ENCOUNTER — Ambulatory Visit (INDEPENDENT_AMBULATORY_CARE_PROVIDER_SITE_OTHER): Payer: Medicare Other | Admitting: Pharmacist

## 2021-01-12 DIAGNOSIS — I129 Hypertensive chronic kidney disease with stage 1 through stage 4 chronic kidney disease, or unspecified chronic kidney disease: Secondary | ICD-10-CM

## 2021-01-12 DIAGNOSIS — J432 Centrilobular emphysema: Secondary | ICD-10-CM

## 2021-01-12 DIAGNOSIS — E1169 Type 2 diabetes mellitus with other specified complication: Secondary | ICD-10-CM

## 2021-01-12 DIAGNOSIS — I480 Paroxysmal atrial fibrillation: Secondary | ICD-10-CM

## 2021-01-12 NOTE — Patient Instructions (Signed)
Visit Information  PATIENT GOALS:  Goals Addressed             This Visit's Progress    Pharmacy Goals       Our goal A1c is less than 7%. This corresponds with fasting sugars less than 130 and 2 hour after meal sugars less than 180. Please check your blood sugar and keep log of results  Please continue to monitor your home weight, blood pressure and heart rate  Our goal bad cholesterol, or LDL, is less than 70 . This is why it is important to continue taking your atorvastatin  Feel free to call me with any questions or concerns. I look forward to our next call!   Wallace Cullens, PharmD, [place] [phone]         The patient verbalized understanding of instructions, educational materials, and care plan provided today and declined offer to receive copy of patient instructions, educational materials, and care plan.   Telephone follow up appointment with care management team member scheduled for: 10/12 at 11:30 am

## 2021-01-12 NOTE — Chronic Care Management (AMB) (Signed)
Chronic Care Management Pharmacy Note  01/12/2021 Name:  Philip HUMBER Sr. MRN:  [id number] DOB:  07/19/1945  Summary:  Recommendations/Changes made from today's visit:  Plan:  Subjective: Philip Potters Sr. is an 75 y.o. year old male who is a primary patient of [name], DO.  The CCM team was consulted for assistance with disease management and care coordination needs.    Engaged with patient by telephone for follow up visit in response to provider referral for pharmacy case management and/or care coordination services.   Consent to Services:  The patient was given information about Chronic Care Management services, agreed to services, and gave verbal consent prior to initiation of services.  Please see initial visit note for detailed documentation.   Patient Care Team: [name], DO as PCP - General (Family Medicine) [name], MD as PCP - Cardiology (Cardiology) [name], MD as Consulting Physician (Cardiology) Leanor Kail, MD (Inactive) (Orthopedic Surgery) Erby Pian, MD as Referring Physician (Specialist) Curley Spice Virl Diamond, RPH-CPP as Pharmacist [place], Dalbert Garnet, RN (Inactive) as [place] [facility] visits: Medication Reconciliation was completed by comparing discharge summary, patient's EMR and Pharmacy list, and upon discussion with patient.  Admitted to [facility] on 9/9 as planned for right L3-4 microdisctomy   New?Medications Started at [facility] Discharge:?? -methocarbamol 500 mg by mouth four times daily -oxycodone 5 mg by mouth every 6 hours as needed for up to 5 days for severe pain -Senna 8.6 mg by mouth daily as needed for constipation  Medications Discontinued at Hospital Discharge: -Stopped Eliquis Per review of chart, per telephone note from [facility] on 8/19 regarding clearance for 9/9 surgery from Cardiology, after collaboration between Cardiology  and Neurosurgery, plan for patient to hold Eliquis for 3 days prior to procedure and then for 2 weeks post procedure  Medications that remain the same after Hospital Discharge:??  -All other medications will remain the same.    Objective:  Lab Results  Component Value Date   CREATININE 1.01 12/09/2020   CREATININE 1.23 05/31/2020   CREATININE 1.07 11/21/2019    Lab Results  Component Value Date   HGBA1C 8.7 06/01/2020   Last diabetic Eye exam:  Lab Results  Component Value Date/Time   HMDIABEYEEXA No Retinopathy 07/08/2018 12:00 AM    Last diabetic Foot exam: No results found for: HMDIABFOOTEX      Component Value Date/Time   CHOL 145 11/21/2019 0915   CHOL 139 12/14/2014 0834   TRIG 99 11/21/2019 0915   HDL 67 11/21/2019 0915   HDL 43 12/14/2014 0834   CHOLHDL 2.2 11/21/2019 0915   VLDL 20 11/21/2019 0915   LDLCALC 58 11/21/2019 0915   LDLCALC 61 06/24/2018 0846    Hepatic Function Latest Ref Rng & Units 05/31/2020 11/21/2019 07/11/2019  Total Protein 6.0 - 8.5 g/dL 5.9(L) 6.6 -  Albumin 3.7 - 4.7 g/dL 4.2 4.1 3.1(A)  AST 0 - 40 IU/L 11 15 19   ALT 0 - 44 IU/L 14 21 8(A)  Alk Phosphatase 44 - 121 IU/L 87 60 -  Total Bilirubin 0.0 - 1.2 mg/dL 0.3 0.9 -  Bilirubin, Direct 0.00 - 0.40 mg/dL - - -   Social History   Tobacco Use  Smoking Status Former   Packs/day: 1.00   Years: 8.00   Pack years: 8.00   Types: Cigarettes   Quit date: 12/31/1967   Years since quitting: 53.0  Smokeless Tobacco Former  BP Readings from Last 3 Encounters:  01/07/21 138/75  12/01/20 110/68  06/10/20 100/65   Pulse Readings from Last 3 Encounters:  01/07/21 66  12/01/20 78  06/10/20 85   Wt Readings from Last 3 Encounters:  01/07/21 195 lb 15.8 oz (88.9 kg)  12/31/20 196 lb (88.9 kg)  12/01/20 201 lb 6 oz (91.3 kg)    Assessment: Review of patient past medical history, allergies, medications, health status, including review of consultants reports, laboratory and other test  data, was performed as part of comprehensive evaluation and provision of chronic care management services.   SDOH:  (Social Determinants of Health) assessments and interventions performed: none   CCM Care Plan  Allergies  Allergen Reactions   Bydureon [Exenatide] Anaphylaxis    Possible from bydureon, not 100% confirmed   Clindamycin/Lincomycin Hives and Itching   Penicillins Hives, Itching, Rash and Other (See Comments)    Has patient had a PCN reaction causing immediate rash, facial/tongue/throat swelling, SOB or lightheadedness with hypotension: Yes Has patient had a PCN reaction causing severe rash involving mucus membranes or skin necrosis: No Has patient had a PCN reaction that required hospitalization: No Has patient had a PCN reaction occurring within the last 10 years: No If all of the above answers are "NO", then may proceed with Cephalosporin use.   Sulfa Antibiotics Shortness Of Breath   Spiriferis Other (See Comments)    Affected breathing   Spiriva Handihaler [Tiotropium Bromide Monohydrate]     Affected breathing   Ivp Dye [Iodinated Diagnostic Agents] Rash and Other (See Comments)    Redness    Medications Reviewed Today     Reviewed by Laqueta Jean, RN (Registered Nurse) on 01/07/21 at [phone]  Med List Status: Complete   Medication Order Taking? Sig Documenting Provider Last Dose Status Informant  acetaminophen (TYLENOL) 500 MG tablet [id number] Yes Take 1,000-1,500 mg by mouth every 8 (eight) hours as needed for moderate pain. [provider] Past Week Active Self  Albuterol Sulfate 108 (90 Base) MCG/ACT AEPB [id number] Yes Inhale 2 puffs into the lungs every 6 (six) hours as needed (shortness of breath).  [provider] 01/07/2021 0500 Active Self  ARTIFICIAL TEAR SOLUTION OP [id number] Yes Place 1 drop into both eyes daily. [provider] Past Week Active Self  atorvastatin (LIPITOR) 40 MG tablet [id number] Yes Take 1 tablet (40 mg total)  by mouth daily. PLEASE SCHEDULE OFFICE VISIT FOR FURTHER REFILLS. THANK YOU! [name], MD 01/06/2021 Active Self  Blood Glucose Monitoring Suppl (ONE TOUCH ULTRA 2) w/Device KIT [id number]  Use to check blood sugar as advised up to twice a day [name], DO  Active Self  calcium carbonate (TUMS - DOSED IN MG ELEMENTAL CALCIUM) 500 MG chewable tablet [id number] Yes Chew 1,000 mg by mouth daily as needed for indigestion or heartburn. [provider] Past Week Active Self  cetirizine (ZYRTEC) 10 MG tablet [id number] Yes Take 10 mg by mouth daily.  [provider] 01/07/2021 0500 Active Self  ELIQUIS 5 MG TABS tablet [id number] Yes Take 1 tablet by mouth twice daily Loel Dubonnet, NP 01/03/2021 Active Self  FARXIGA 10 MG TABS tablet [id number] Yes Take 1 tablet (10 mg total) by mouth daily before breakfast. [name], DO 01/03/2021 Active Self  fluticasone (FLONASE) 50 MCG/ACT nasal spray [id number] Yes Place 1 spray into both nostrils daily as needed for allergies or rhinitis. [provider] Past Month Active Self  furosemide (LASIX) 40  MG tablet [id number] Yes Take 0.5-1 tablets (20-40 mg total) by mouth daily as needed for edema. Only as needed now. [name], DO 01/06/2021 Active Self  gabapentin (NEURONTIN) 100 MG capsule [id number] Yes Take 1 capsule (100 mg total) by mouth 3 (three) times daily. [name], DO 01/07/2021 0500 Active Self  ipratropium-albuterol (DUONEB) 0.5-2.5 (3) MG/3ML SOLN [id number] Yes Take 3 mLs by nebulization every 6 (six) hours as needed (shortness of breath). [provider] 01/07/2021 0500 Active Self           Med Note Caryn Section, KYLE A   Fri Dec 24, 2020  4:04 PM)    Lanell Matar THIN MISC [id number]  1 Device by Does not apply route 4 (four) times daily -  before meals and at bedtime. [name], MD  Active Self  levothyroxine (SYNTHROID) 88 MCG tablet [id number] Yes TAKE 1 TABLET  BY MOUTH ONCE DAILY BEFORE BREAKFAST [name], DO 01/07/2021 0500 Active Self  losartan (COZAAR) 25 MG tablet [id number] Yes Take 0.5 tablets (12.5 mg total) by mouth daily. PLEASE SCHEDULE OFFICE VISIT FOR FURTHER REFILLS. THANK YOU! [name], MD 01/06/2021 Active Self  metFORMIN (GLUCOPHAGE) 500 MG tablet [id number] Yes TAKE 2 TABLETS BY MOUTH TWICE DAILY WITH A MEAL . APPOINTMENT REQUIRED FOR FUTURE REFILLS [name], DO 01/03/2021 Active Self  metoprolol tartrate (LOPRESSOR) 50 MG tablet [id number] Yes TAKE 1 & 1/2 (ONE & ONE-HALF) TABLETS BY MOUTH TWICE DAILY . APPOINTMENT REQUIRED FOR FUTURE REFILLS Rise Mu, [name] 01/07/2021 0500 Active Self  montelukast (SINGULAIR) 10 MG tablet [id number] Yes Take 10 mg by mouth at bedtime. [provider] 01/06/2021 Active Self  Donald Siva test strip [id number]  USE STRIP TO CHECK GLUCOSE TWICE DAILY [name] [name], DO  Active Self  spironolactone (ALDACTONE) 25 MG tablet [id number] Yes Take 0.5 tablets (12.5 mg total) by mouth daily. Kathlen Mody, [name], [name] 01/06/2021 Active Self  SYMBICORT 160-4.5 MCG/ACT inhaler [id number] Yes Inhale 2 puffs 2 (two) times daily into the lungs.  [provider] 01/06/2021 Active Self            Patient Active Problem List   Diagnosis Date Noted   Chronic right hip pain 11/19/2019   Thrush    MSSA bacteremia    Chronic systolic CHF (congestive heart failure) (HCC)    AF (paroxysmal atrial fibrillation) (HCC)    HFrEF (heart failure with reduced ejection fraction) ([place])    Community acquired pneumonia of left lower lobe of lung    COPD with acute exacerbation ([place]) 06/26/2019   Overweight (BMI 25.0-29.9) 07/01/2018   Osteoarthritis of multiple joints 05/14/2017   Chronic pain of left knee 05/14/2017   OSA (obstructive sleep apnea) 02/28/2017   CKD (chronic kidney disease), stage II 02/28/2017   Chronic anticoagulation 02/28/2017   Onychomycosis of left great  toe 08/14/2016   Allergic rhinitis due to allergen 07/31/2016   Peripheral neuropathy 08/17/2015   Type 2 diabetes mellitus with hyperlipidemia ([place]) 08/17/2015   (HFpEF) heart failure with preserved ejection fraction ([place]) 07/25/2015   Atrial fibrillation with rapid ventricular response ([place]) 12/10/2014   Centrilobular emphysema ([place]) 11/09/2014   Bradycardia 11/09/2014   Cardiac conduction disorder 11/09/2014   Benign hypertension with CKD (chronic kidney disease), stage II 11/09/2014   Bursitis of hip 11/09/2014   Enlarged prostate 11/09/2014   Coronary artery disease    S/P CABG x 3 05/12/2012   Hypothyroid 05/06/2012   Hyperlipidemia associated with  type 2 diabetes mellitus ([place]) 03/31/2012   Colon polyp 12/21/2003    Immunization History  Administered Date(s) Administered   Fluad Quad(high Dose 65+) 12/31/2018   Influenza, High Dose Seasonal PF 01/06/2015, 02/28/2017   Influenza-Unspecified 02/08/2014, 03/01/2018   Pneumococcal Conjugate-13 01/02/2014   Pneumococcal Polysaccharide-23 05/01/2012   Tdap 05/01/2012    Conditions to be addressed/monitored: Atrial Fibrillation, HTN, HLD, COPD, hypothyroidism and DMII  Care Plan : PharmD - Medication Management/Medication Assistance  Updates made by [name], RPH-CPP since 01/12/2021 12:00 AM     Problem: Disease Progression      Long-Range Goal: Disease Progression Prevented or Minimized   Start Date: 06/16/2020  Expected End Date: 09/14/2020  This Visit's Progress: On track  Recent Progress: On track  Priority: High  Note:   Current Barriers:  Knowledge deficits related to dietary choices to improve blood sugar control Financial - difficulty with affording [name] through Medicare Part D health plan Patient APPROVED for patient assistance for Farxiga from [employer] through 04/30/2021 Patient APPROVED for patient assistance for Eliquis from [employer] from 01/06/21-04/30/21  Pharmacist Clinical Goal(s):  Over the next  90 days, patient will achieve control of T2DM as evidenced by A1C through collaboration with PharmD and provider.   Interventions: 1:1 collaboration with [name], DO regarding development and update of comprehensive plan of care as evidenced by provider attestation and co-signature Inter-disciplinary care team collaboration (see longitudinal plan of care) Perform chart review Patient admitted to [facility] on 9/9 as planned for right L3-4 microdisctomy Today reports he is recovering well since surgery Reports pain controlled pretty well by taking Tylenol 500 mg -2 tablets (1000 mg) every 8 hours as needed, methocarbamol 500 mg ~ twice daily as needed and oxycodone 5 mg every 6 hours as needed - but mostly only taking oxycodone overnight Denies dizziness and drowsiness with methocarbamol and uses caution with oxycodone - only taking at bedtime Denies constipation. Denies needing senna Reports is holding Eliquis as directed by Neurosurgeon for 2 weeks after procedure From review of chart, per telephone note from [facility] on 8/19 regarding clearance for 9/9 surgery from Cardiology, after collaboration between Cardiology and Neurosurgery, plan for patient to hold Eliquis for 3 days prior to procedure and then for 2 weeks post procedure Next follow up with Neurosurgeon on 9/22  Type 2 Diabetes Uncontrolled based on latest A1C, but home CBG readings improved; current treatment: metformin 500 mg - 2 tablets (1,000 mg) twice daily as directed Farxiga 10 mg - 1 tablet QAM   Review recent blood sugar results:  Fasting Blood Glucose After Supper (~2 hours)  11 - September 150 252  12 - September 137 144  13 - September 114 144  14 - September -   Reports blood sugars were elevated on the day of the procedure but have been returning to his normal range over the past few days   CHF/HTN Current treatment: Losartan 25 mg - 1/2 tablet (12.5 mg)  once daily Metoprolol 75 mg twice daily Spironolactone 25 mg - 1/2 tablet (12.5 mg) daily (started last week) Farxiga 10 mg - 1 tablets daily Furosemide 40 mg daily as needed as directed by Cardiologist Reports recent BP results: 9/13: 119/86, HR 79 9/12: 134/89, HR 79 Note patient weighs himself daily and follows directions from Cardiologist for use of PRN furosemide (Lasix).  Have counseled on importance of contacting Cardiologist for BP and HR readings outside of established parameters or new/worsening symtpoms  COPD/seasonal allergies: Patient follows with Dr. Raul Del at [facility] Current treatment: Symbicort 160/4.5 two puffs bid Montelukast 10 mg QHS Albuterol/ipratropium nebulizer solution/ albuterol inhaler as needed Cetirizine 10 mg daily Flonase nasal spray as needed Using saline Navage pods Confirms using maintenance (Symbicort) inhaler twice daily and rinsing mouth out after each use and using rescue inhaler (albuterol) as needed  Medication/Device Adherence Note patient uses weekly pillbox to organize medications  Medication Assistance: Patient currently receiving patient assistance for Farxiga from [employer] through 04/30/2021 Collaborated with Rome Simcox for assistance to patient with applying for patient assistance for Eliquis Received message from Leonardo Simcox on 9/12 letting me know patient was approved for Eliquis patient assistance from [employer] and patient will need to call to setup shipment Today patient reports he received call from assistance program to schedule shipment and has already received supply of the medication  Patient Goals/Self-Care Activities Over the next 90 days, patient will:  Self administers medications as prescribed Attends all scheduled provider appointments Calls pharmacy for medication refills Calls provider office for new concerns or questions  Patient to check blood sugar regularly as directed and keep  log Patient to keep log when checks blood pressure  Follow Up Plan: Telephone follow up appointment with care management team member scheduled for: 10/12 at 11:30 am       Patient's preferred pharmacy is:  [facility] [address]), [place] - [place]) [place] [address] Phone: [phone] Fax: [phone]  Follow Up:  Patient agrees to Care Plan and Follow-up.  Wallace Cullens, PharmD, [place], CPP Clinical Pharmacist [facility] [phone]

## 2021-01-14 DIAGNOSIS — M5441 Lumbago with sciatica, right side: Secondary | ICD-10-CM | POA: Diagnosis not present

## 2021-01-14 DIAGNOSIS — I5022 Chronic systolic (congestive) heart failure: Secondary | ICD-10-CM | POA: Diagnosis not present

## 2021-01-14 DIAGNOSIS — R2681 Unsteadiness on feet: Secondary | ICD-10-CM | POA: Diagnosis not present

## 2021-01-14 DIAGNOSIS — E1169 Type 2 diabetes mellitus with other specified complication: Secondary | ICD-10-CM | POA: Diagnosis not present

## 2021-01-14 DIAGNOSIS — I480 Paroxysmal atrial fibrillation: Secondary | ICD-10-CM | POA: Diagnosis not present

## 2021-01-14 DIAGNOSIS — E785 Hyperlipidemia, unspecified: Secondary | ICD-10-CM | POA: Diagnosis not present

## 2021-01-14 DIAGNOSIS — J441 Chronic obstructive pulmonary disease with (acute) exacerbation: Secondary | ICD-10-CM | POA: Diagnosis not present

## 2021-01-14 DIAGNOSIS — Z4789 Encounter for other orthopedic aftercare: Secondary | ICD-10-CM | POA: Diagnosis not present

## 2021-01-14 DIAGNOSIS — N182 Chronic kidney disease, stage 2 (mild): Secondary | ICD-10-CM | POA: Diagnosis not present

## 2021-01-14 DIAGNOSIS — I130 Hypertensive heart and chronic kidney disease with heart failure and stage 1 through stage 4 chronic kidney disease, or unspecified chronic kidney disease: Secondary | ICD-10-CM | POA: Diagnosis not present

## 2021-01-18 DIAGNOSIS — I5022 Chronic systolic (congestive) heart failure: Secondary | ICD-10-CM | POA: Diagnosis not present

## 2021-01-18 DIAGNOSIS — E1169 Type 2 diabetes mellitus with other specified complication: Secondary | ICD-10-CM | POA: Diagnosis not present

## 2021-01-18 DIAGNOSIS — E785 Hyperlipidemia, unspecified: Secondary | ICD-10-CM | POA: Diagnosis not present

## 2021-01-18 DIAGNOSIS — N182 Chronic kidney disease, stage 2 (mild): Secondary | ICD-10-CM | POA: Diagnosis not present

## 2021-01-18 DIAGNOSIS — R2681 Unsteadiness on feet: Secondary | ICD-10-CM | POA: Diagnosis not present

## 2021-01-18 DIAGNOSIS — J441 Chronic obstructive pulmonary disease with (acute) exacerbation: Secondary | ICD-10-CM | POA: Diagnosis not present

## 2021-01-18 DIAGNOSIS — M5441 Lumbago with sciatica, right side: Secondary | ICD-10-CM | POA: Diagnosis not present

## 2021-01-18 DIAGNOSIS — I130 Hypertensive heart and chronic kidney disease with heart failure and stage 1 through stage 4 chronic kidney disease, or unspecified chronic kidney disease: Secondary | ICD-10-CM | POA: Diagnosis not present

## 2021-01-18 DIAGNOSIS — I480 Paroxysmal atrial fibrillation: Secondary | ICD-10-CM | POA: Diagnosis not present

## 2021-01-18 DIAGNOSIS — Z4789 Encounter for other orthopedic aftercare: Secondary | ICD-10-CM | POA: Diagnosis not present

## 2021-01-19 DIAGNOSIS — N182 Chronic kidney disease, stage 2 (mild): Secondary | ICD-10-CM | POA: Diagnosis not present

## 2021-01-19 DIAGNOSIS — Z4789 Encounter for other orthopedic aftercare: Secondary | ICD-10-CM | POA: Diagnosis not present

## 2021-01-19 DIAGNOSIS — E1169 Type 2 diabetes mellitus with other specified complication: Secondary | ICD-10-CM | POA: Diagnosis not present

## 2021-01-19 DIAGNOSIS — E785 Hyperlipidemia, unspecified: Secondary | ICD-10-CM | POA: Diagnosis not present

## 2021-01-19 DIAGNOSIS — M5441 Lumbago with sciatica, right side: Secondary | ICD-10-CM | POA: Diagnosis not present

## 2021-01-19 DIAGNOSIS — R2681 Unsteadiness on feet: Secondary | ICD-10-CM | POA: Diagnosis not present

## 2021-01-19 DIAGNOSIS — I130 Hypertensive heart and chronic kidney disease with heart failure and stage 1 through stage 4 chronic kidney disease, or unspecified chronic kidney disease: Secondary | ICD-10-CM | POA: Diagnosis not present

## 2021-01-19 DIAGNOSIS — I5022 Chronic systolic (congestive) heart failure: Secondary | ICD-10-CM | POA: Diagnosis not present

## 2021-01-19 DIAGNOSIS — J441 Chronic obstructive pulmonary disease with (acute) exacerbation: Secondary | ICD-10-CM | POA: Diagnosis not present

## 2021-01-19 DIAGNOSIS — I480 Paroxysmal atrial fibrillation: Secondary | ICD-10-CM | POA: Diagnosis not present

## 2021-01-21 DIAGNOSIS — N182 Chronic kidney disease, stage 2 (mild): Secondary | ICD-10-CM | POA: Diagnosis not present

## 2021-01-21 DIAGNOSIS — Z4789 Encounter for other orthopedic aftercare: Secondary | ICD-10-CM | POA: Diagnosis not present

## 2021-01-21 DIAGNOSIS — I130 Hypertensive heart and chronic kidney disease with heart failure and stage 1 through stage 4 chronic kidney disease, or unspecified chronic kidney disease: Secondary | ICD-10-CM | POA: Diagnosis not present

## 2021-01-21 DIAGNOSIS — M5441 Lumbago with sciatica, right side: Secondary | ICD-10-CM | POA: Diagnosis not present

## 2021-01-21 DIAGNOSIS — I480 Paroxysmal atrial fibrillation: Secondary | ICD-10-CM | POA: Diagnosis not present

## 2021-01-21 DIAGNOSIS — I5022 Chronic systolic (congestive) heart failure: Secondary | ICD-10-CM | POA: Diagnosis not present

## 2021-01-21 DIAGNOSIS — E785 Hyperlipidemia, unspecified: Secondary | ICD-10-CM | POA: Diagnosis not present

## 2021-01-21 DIAGNOSIS — R2681 Unsteadiness on feet: Secondary | ICD-10-CM | POA: Diagnosis not present

## 2021-01-21 DIAGNOSIS — E1169 Type 2 diabetes mellitus with other specified complication: Secondary | ICD-10-CM | POA: Diagnosis not present

## 2021-01-21 DIAGNOSIS — J441 Chronic obstructive pulmonary disease with (acute) exacerbation: Secondary | ICD-10-CM | POA: Diagnosis not present

## 2021-01-26 ENCOUNTER — Other Ambulatory Visit: Payer: Self-pay | Admitting: Cardiovascular Disease

## 2021-01-26 ENCOUNTER — Other Ambulatory Visit: Payer: Self-pay | Admitting: Family Medicine

## 2021-01-26 DIAGNOSIS — E119 Type 2 diabetes mellitus without complications: Secondary | ICD-10-CM

## 2021-01-28 DIAGNOSIS — N182 Chronic kidney disease, stage 2 (mild): Secondary | ICD-10-CM | POA: Diagnosis not present

## 2021-01-28 DIAGNOSIS — E785 Hyperlipidemia, unspecified: Secondary | ICD-10-CM

## 2021-01-28 DIAGNOSIS — I129 Hypertensive chronic kidney disease with stage 1 through stage 4 chronic kidney disease, or unspecified chronic kidney disease: Secondary | ICD-10-CM

## 2021-01-28 DIAGNOSIS — E1169 Type 2 diabetes mellitus with other specified complication: Secondary | ICD-10-CM

## 2021-01-28 DIAGNOSIS — J432 Centrilobular emphysema: Secondary | ICD-10-CM | POA: Diagnosis not present

## 2021-01-28 DIAGNOSIS — I480 Paroxysmal atrial fibrillation: Secondary | ICD-10-CM

## 2021-01-31 ENCOUNTER — Encounter: Payer: Self-pay | Admitting: Family Medicine

## 2021-01-31 ENCOUNTER — Other Ambulatory Visit: Payer: Self-pay

## 2021-01-31 ENCOUNTER — Ambulatory Visit (INDEPENDENT_AMBULATORY_CARE_PROVIDER_SITE_OTHER): Payer: Medicare Other | Admitting: Family Medicine

## 2021-01-31 VITALS — BP 111/74 | HR 94 | Ht 71.0 in | Wt 191.0 lb

## 2021-01-31 DIAGNOSIS — E1142 Type 2 diabetes mellitus with diabetic polyneuropathy: Secondary | ICD-10-CM

## 2021-01-31 DIAGNOSIS — Z23 Encounter for immunization: Secondary | ICD-10-CM

## 2021-01-31 DIAGNOSIS — E034 Atrophy of thyroid (acquired): Secondary | ICD-10-CM

## 2021-01-31 DIAGNOSIS — Z9889 Other specified postprocedural states: Secondary | ICD-10-CM

## 2021-01-31 DIAGNOSIS — M5136 Other intervertebral disc degeneration, lumbar region: Secondary | ICD-10-CM | POA: Diagnosis not present

## 2021-01-31 LAB — TSH: TSH: 1.27 mIU/L (ref 0.40–4.50)

## 2021-01-31 LAB — T4, FREE: Free T4: 1.5 ng/dL (ref 0.8–1.8)

## 2021-01-31 LAB — POCT GLYCOSYLATED HEMOGLOBIN (HGB A1C): Hemoglobin A1C: 6.8 % — AB (ref 4.0–5.6)

## 2021-01-31 MED ORDER — EUTHYROX 88 MCG PO TABS: 88.0000 ug | ORAL_TABLET | Freq: Every day | ORAL | 3 refills | Status: DC

## 2021-01-31 MED ORDER — METFORMIN HCL 500 MG PO TABS: 1000.0000 mg | ORAL_TABLET | Freq: Two times a day (BID) | ORAL | 3 refills | Status: DC

## 2021-01-31 MED ORDER — GABAPENTIN 100 MG PO CAPS: 100.0000 mg | ORAL_CAPSULE | Freq: Three times a day (TID) | ORAL | 3 refills | Status: DC

## 2021-01-31 NOTE — Assessment & Plan Note (Signed)
Dramatic improvement A1c 6.8 - No hypoglycemia Concern with prednisone burst Complications - CKD-II, peripheral neuropathy, cataracts Remains OFF GLP1 Bydureon (allergy), and Lantus insulin, Januvia  Plan:  Continue Farxiga 10mg  through PAP Continue Metformin 1000mg  BID (500mg  x 2 BID) add refills Encourage improved lifestyle - low carb, low sugar diet, reduce portion size, continue improving regular exercise - emphasized limiting sweets / eating out - he admits these are raising sugar lately Check CBG, bring log to next visit for review Continue ARB, Statin

## 2021-01-31 NOTE — Assessment & Plan Note (Signed)
Overdue for next thyroid lab Continue Euthyrox 33mcg daily re order today Labs ordered today TSH + T4 REpeat in 6 months

## 2021-01-31 NOTE — Progress Notes (Signed)
Subjective:    Patient ID: Philip [name]., male    DOB: 12/28/1945, 75 y.o.   MRN: [id number]  [name] is a 75 y.o. male presenting on 01/31/2021 for Diabetes   HPI  CHRONIC DM, Type 2: Recent updates with improved CBGs. He has had prednisone burst recently but otherwise had done well He has improved his diet overall Meds: Metformin 1000mg  twice daily (500mg  tabs), Farxiga 10mg  daily (through PAP assistance) - OFF Januvia, Bydureon, Lantus Reports good compliance. 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 Denies hypoglycemia  Lumbar DDD S/p Lumbar diskectomy 01/07/21, R L3-4 microdiskectomy - done by [facility] Neuro[facility] He has had issue with prolong sitting and issues since surgery but he has done well today and doing better. He was on oxycodone initially, and now on steroid. - He was doing some PT that seems to have aggravated some symptoms.  Hypothyroidism On Euthyrox 50mcg daily, needs new order and lab Last Thyroid panel 06/2019  Health Maintenance: Due for Flu Shot, will receive today    Depression screen [facility] 2/9 01/31/2021 08/03/2020 03/12/2019  Decreased Interest 3 0 0  Down, Depressed, Hopeless 0 0 0  PHQ - 2 Score 3 0 0  Altered sleeping 3 - -  Tired, decreased energy 3 - -  Change in appetite 3 - -  Feeling bad or failure about yourself  0 - -  Trouble concentrating 0 - -  Moving slowly or fidgety/restless 0 - -  Suicidal thoughts 0 - -  PHQ-9 Score 12 - -  Difficult doing work/chores Not difficult at all - -  Some recent data might be hidden    Social History   Tobacco Use   Smoking status: Former    Packs/day: 1.00    Years: 8.00    Pack years: 8.00    Types: Cigarettes    Quit date: 12/31/1967    Years since quitting: 53.1   Smokeless tobacco: Former  Brewing technologist Use: Never used  Substance Use Topics   Alcohol use: No    Alcohol/week: 0.0 standard drinks    Comment: 05/06/2012 "last alcohol several years ago; never had problem wit"   Drug use: No    Review of Systems Per HPI unless specifically indicated above     Objective:    BP 111/74   Pulse 94   Ht 5\' 11"  (1.803 m)   Wt 191 lb (86.6 kg)   SpO2 97%   BMI 26.64 kg/m   Wt Readings from Last 3 Encounters:  01/31/21 191 lb (86.6 kg)  01/07/21 195 lb 15.8 oz (88.9 kg)  12/31/20 196 lb (88.9 kg)    Physical Exam Vitals and nursing note reviewed.  Constitutional:      General: He is not in acute distress.    Appearance: He is well-developed. He is not diaphoretic.     Comments: Well-appearing, comfortable, cooperative  HENT:     Head: Normocephalic and atraumatic.  Eyes:     General:        Right eye: No discharge.        Left eye: No discharge.     Conjunctiva/sclera: Conjunctivae normal.  Neck:     Thyroid: No thyromegaly.  Cardiovascular:     Rate and Rhythm: Normal rate and regular rhythm.  Pulses: Normal pulses.     Heart sounds: Normal heart sounds. No murmur heard. Pulmonary:     Effort: Pulmonary effort is normal. No respiratory distress.     Breath sounds: Normal breath sounds. No wheezing or rales.  Musculoskeletal:        General: Normal range of motion.     Cervical back: Normal range of motion and neck supple.  Lymphadenopathy:     Cervical: No cervical adenopathy.  Skin:    General: Skin is warm and dry.     Findings: No erythema or rash.  Neurological:     Mental Status: He is alert and oriented to person, place, and time. Mental status is at baseline.  Psychiatric:        Behavior: Behavior normal.     Comments: Well groomed, good eye contact, normal speech and thoughts   Results for orders placed or performed in visit on 01/31/21  POCT HgB A1C  Result Value Ref Range   Hemoglobin A1C 6.8 (A) 4.0 - 5.6 %      Assessment & Plan:    Problem List Items Addressed This Visit     Type 2 diabetes mellitus with peripheral neuropathy (HCC) - Primary    Dramatic improvement A1c 6.8 - No hypoglycemia Concern with prednisone burst Complications - CKD-II, peripheral neuropathy, cataracts Remains OFF GLP1 Bydureon (allergy), and Lantus insulin, Januvia  Plan:  Continue Farxiga 10mg  through PAP Continue Metformin 1000mg  BID (500mg  x 2 BID) add refills Encourage improved lifestyle - low carb, low sugar diet, reduce portion size, continue improving regular exercise - emphasized limiting sweets / eating out - he admits these are raising sugar lately Check CBG, bring log to next visit for review Continue ARB, Statin      Relevant Medications   metFORMIN (GLUCOPHAGE) 500 MG tablet   gabapentin (NEURONTIN) 100 MG capsule   Other Relevant Orders   POCT HgB A1C (Completed)   Hypothyroid    Overdue for next thyroid lab Continue Euthyrox 21mcg daily re order today Labs ordered today TSH + T4 REpeat in 6 months      Relevant Medications   EUTHYROX 88 MCG tablet   Other Relevant Orders   TSH   T4, free   DDD (degenerative disc disease), lumbar   Other Visit Diagnoses     Needs flu shot       Relevant Orders   Flu Vaccine QUAD High Dose(Fluad) (Completed)   S/P lumbar microdiscectomy           S/p recent lumbar surgery per orthopedics Improving on steroid, continue per specialty management  Meds ordered this encounter  Medications   metFORMIN (GLUCOPHAGE) 500 MG tablet    Sig: Take 2 tablets (1,000 mg total) by mouth 2 (two) times daily with a meal.    Dispense:  360 tablet    Refill:  3   EUTHYROX 88 MCG tablet    Sig: Take 1 tablet (88 mcg total) by mouth daily before breakfast.    Dispense:  90 tablet    Refill:  3   gabapentin (NEURONTIN) 100 MG capsule    Sig: Take 1 capsule (100 mg total) by mouth 3 (three) times daily.    Dispense:  270 capsule    Refill:  3      Follow up plan: Return in  about 6 months (around 08/01/2021) for 6 month fasting lab only then 1 week later Annual Physical.  Future labs including CMET CBC Lipid A1c TSH  T4 07/2021  [name], [facility] 01/31/2021, 2:10 PM

## 2021-01-31 NOTE — Patient Instructions (Addendum)
Thank you for coming to the office today.  Blood today for Thyroid blood test  Re ordered Euthyrox medicine 4mcg daily, if we need to change it we can call you.  Recent Labs    06/01/20 0000 01/31/21 1412  HGBA1C 8.7 6.8*   Refilled Metformin.  Keep up the good work with sugar!  I would wait 3+ months for any repeat surgery after the back.  DUE for FASTING BLOOD WORK (no food or drink after midnight before the lab appointment, only water or coffee without cream/sugar on the morning of)  SCHEDULE "Lab Only" visit in the morning at the clinic for lab draw in 6 MONTHS   - Make sure Lab Only appointment is at about 1 week before your next appointment, so that results will be available  For Lab Results, once available within 2-3 days of blood draw, you can can log in to MyChart online to view your results and a brief explanation. Also, we can discuss results at next follow-up visit.   Please schedule a Follow-up Appointment to: Return in about 6 months (around 08/01/2021) for 6 month fasting lab only then 1 week later Annual Physical.  If you have any other questions or concerns, please feel free to call the office or send a message through [place]. You may also schedule an earlier appointment if necessary.  Additionally, you may be receiving a survey about your experience at our office within a few days to 1 week by e-mail or mail. We value your feedback.  [name], DO [place]

## 2021-02-03 ENCOUNTER — Other Ambulatory Visit: Payer: Self-pay

## 2021-02-03 ENCOUNTER — Ambulatory Visit (INDEPENDENT_AMBULATORY_CARE_PROVIDER_SITE_OTHER): Payer: Medicare Other

## 2021-02-03 DIAGNOSIS — I429 Cardiomyopathy, unspecified: Secondary | ICD-10-CM

## 2021-02-03 DIAGNOSIS — I428 Other cardiomyopathies: Secondary | ICD-10-CM

## 2021-02-03 LAB — ECHOCARDIOGRAM LIMITED
Calc EF: 41.5 %
S' Lateral: 3.3 cm
Single Plane A2C EF: 39 %
Single Plane A4C EF: 44.6 %

## 2021-02-08 DIAGNOSIS — M1711 Unilateral primary osteoarthritis, right knee: Secondary | ICD-10-CM | POA: Diagnosis not present

## 2021-02-09 ENCOUNTER — Ambulatory Visit (INDEPENDENT_AMBULATORY_CARE_PROVIDER_SITE_OTHER): Payer: Medicare Other | Admitting: Pharmacist

## 2021-02-09 DIAGNOSIS — J432 Centrilobular emphysema: Secondary | ICD-10-CM

## 2021-02-09 DIAGNOSIS — I129 Hypertensive chronic kidney disease with stage 1 through stage 4 chronic kidney disease, or unspecified chronic kidney disease: Secondary | ICD-10-CM

## 2021-02-09 DIAGNOSIS — E1142 Type 2 diabetes mellitus with diabetic polyneuropathy: Secondary | ICD-10-CM

## 2021-02-09 NOTE — Patient Instructions (Signed)
Visit Information  PATIENT GOALS:  Goals Addressed             This Visit's Progress    Pharmacy Goals       Our goal A1c is less than 7%. This corresponds with fasting sugars less than 130 and 2 hour after meal sugars less than 180. Please check your blood sugar and keep log of results  Please continue to monitor your home weight, blood pressure and heart rate  Our goal bad cholesterol, or LDL, is less than 70 . This is why it is important to continue taking your atorvastatin  Feel free to call me with any questions or concerns. I look forward to our next call!  Wallace Cullens, PharmD, [place] [phone]         The patient verbalized understanding of instructions, educational materials, and care plan provided today and declined offer to receive copy of patient instructions, educational materials, and care plan.   Telephone follow up appointment with care management team member scheduled for: 11/16 at 9:15 am

## 2021-02-09 NOTE — Chronic Care Management (AMB) (Signed)
Chronic Care Management Pharmacy Note  02/09/2021 Name:  Philip AHONEN Sr. MRN:  [id number] DOB:  12/10/1945   Subjective: Philip Potters Sr. is an 75 y.o. year old male who is a primary patient of [name], DO.  The CCM team was consulted for assistance with disease management and care coordination needs.    Engaged with patient by telephone for follow up visit in response to provider referral for pharmacy case management and/or care coordination services.   Consent to Services:  The patient was given information about Chronic Care Management services, agreed to services, and gave verbal consent prior to initiation of services.  Please see initial visit note for detailed documentation.   Patient Care Team: [name], DO as PCP - General (Family Medicine) [name], MD as PCP - Cardiology (Cardiology) [name], MD as Consulting Physician (Cardiology) Leanor Kail, MD (Inactive) (Orthopedic Surgery) Erby Pian, MD as Referring Physician (Specialist) Curley Spice Virl Diamond, RPH-CPP as Pharmacist [place], Dalbert Garnet, RN (Inactive) as Case Manager  Recent office visits: Office Visit with PCP on 10/3  Recent consult visits: Office Visit with [place] on 10/11  Hospital visits: Admitted to [facility] on 9/9 as planned for right L3-4 microdisctomy   Objective:  Lab Results  Component Value Date   CREATININE 1.01 12/09/2020   CREATININE 1.23 05/31/2020   CREATININE 1.07 11/21/2019    Lab Results  Component Value Date   HGBA1C 6.8 (A) 01/31/2021   Last diabetic Eye exam:  Lab Results  Component Value Date/Time   HMDIABEYEEXA No Retinopathy 07/08/2018 12:00 AM    Last diabetic Foot exam: No results found for: HMDIABFOOTEX      Component Value Date/Time   CHOL 145 11/21/2019 0915   CHOL 139 12/14/2014 0834   TRIG 99 11/21/2019 0915   HDL 67 11/21/2019 0915   HDL 43  12/14/2014 0834   CHOLHDL 2.2 11/21/2019 0915   VLDL 20 11/21/2019 0915   LDLCALC 58 11/21/2019 0915   LDLCALC 61 06/24/2018 0846    Hepatic Function Latest Ref Rng & Units 05/31/2020 11/21/2019 07/11/2019  Total Protein 6.0 - 8.5 g/dL 5.9(L) 6.6 -  Albumin 3.7 - 4.7 g/dL 4.2 4.1 3.1(A)  AST 0 - 40 IU/L 11 15 19   ALT 0 - 44 IU/L 14 21 8(A)  Alk Phosphatase 44 - 121 IU/L 87 60 -  Total Bilirubin 0.0 - 1.2 mg/dL 0.3 0.9 -  Bilirubin, Direct 0.00 - 0.40 mg/dL - - -    Lab Results  Component Value Date/Time   TSH 1.27 01/31/2021 02:41 PM   TSH 2.676 06/30/2019 04:48 AM   TSH 3.460 04/17/2019 09:09 AM   FREET4 1.5 01/31/2021 02:41 PM   FREET4 1.4 06/24/2018 08:46 AM     Social History   Tobacco Use  Smoking Status Former   Packs/day: 1.00   Years: 8.00   Pack years: 8.00   Types: Cigarettes   Quit date: 12/31/1967   Years since quitting: 53.1  Smokeless Tobacco Former   BP Readings from Last 3 Encounters:  01/31/21 111/74  01/07/21 138/75  12/01/20 110/68   Pulse Readings from Last 3 Encounters:  01/31/21 94  01/07/21 66  12/01/20 78   Wt Readings from Last 3 Encounters:  01/31/21 191 lb (86.6 kg)  01/07/21 195 lb 15.8 oz (88.9 kg)  12/31/20 196 lb (88.9 kg)    Assessment: Review of patient past medical history, allergies,  medications, health status, including review of consultants reports, laboratory and other test data, was performed as part of comprehensive evaluation and provision of chronic care management services.   SDOH:  (Social Determinants of Health) assessments and interventions performed:    CCM Care Plan  Allergies  Allergen Reactions   Bydureon [Exenatide] Anaphylaxis    Possible from bydureon, not 100% confirmed   Clindamycin/Lincomycin Hives and Itching   Penicillins Hives, Itching, Rash and Other (See Comments)    Has patient had a PCN reaction causing immediate rash, facial/tongue/throat swelling, SOB or lightheadedness with hypotension:  Yes Has patient had a PCN reaction causing severe rash involving mucus membranes or skin necrosis: No Has patient had a PCN reaction that required hospitalization: No Has patient had a PCN reaction occurring within the last 10 years: No If all of the above answers are "NO", then may proceed with Cephalosporin use.   Sulfa Antibiotics Shortness Of Breath   Spiriferis Other (See Comments)    Affected breathing   Spiriva Handihaler [Tiotropium Bromide Monohydrate]     Affected breathing   Ivp Dye [Iodinated Diagnostic Agents] Rash and Other (See Comments)    Redness    Medications Reviewed Today     Reviewed by [name], RPH-CPP (Pharmacist) on 02/09/21 at 35  Med List Status: <None>   Medication Order Taking? Sig Documenting Provider Last Dose Status Informant  acetaminophen (TYLENOL) 500 MG tablet [id number] Yes Take 1,000 mg by mouth every 8 (eight) hours as needed for moderate pain. [provider] Taking Active Self  Albuterol Sulfate 108 (90 Base) MCG/ACT AEPB [id number] Yes Inhale 2 puffs into the lungs every 6 (six) hours as needed (shortness of breath).  [provider] Taking Active Self  apixaban (ELIQUIS) 5 MG TABS tablet [id number] Yes Take 5 mg by mouth 2 (two) times daily. [provider] Taking Active   ARTIFICIAL TEAR SOLUTION OP [id number]  Place 1 drop into both eyes daily. [provider]  Active Self  atorvastatin (LIPITOR) 40 MG tablet [id number] Yes Take 1 tablet (40 mg total) by mouth daily. [name], MD Taking Active   Blood Glucose Monitoring Suppl (ONE TOUCH ULTRA 2) w/Device KIT [id number]  Use to check blood sugar as advised up to twice a day [name], DO  Active Self  calcium carbonate (TUMS - DOSED IN MG ELEMENTAL CALCIUM) 500 MG chewable tablet [id number] Yes Chew 1,000 mg by mouth daily as needed for indigestion or heartburn. [provider] Taking Active Self  cetirizine (ZYRTEC) 10 MG  tablet [id number] Yes Take 10 mg by mouth daily.  [provider] Taking Active Self  EUTHYROX 88 MCG tablet [id number] Yes Take 1 tablet (88 mcg total) by mouth daily before breakfast. [name], DO Taking Active   FARXIGA 10 MG TABS tablet [id number] Yes Take 1 tablet (10 mg total) by mouth daily before breakfast. [name], DO Taking Active Self  fluticasone (FLONASE) 50 MCG/ACT nasal spray [id number] Yes Place 1 spray into both nostrils daily as needed for allergies or rhinitis. [provider] Taking Active Self  furosemide (LASIX) 40 MG tablet [id number] Yes Take 0.5-1 tablets (20-40 mg total) by mouth daily as needed for edema. Only as needed now. [name], DO Taking Active Self  gabapentin (NEURONTIN) 100 MG capsule [id number] Yes Take 1 capsule (100 mg total) by mouth 3 (three) times daily. [name], [name], DO Taking Active   ipratropium-albuterol (DUONEB) 0.5-2.5 (3) MG/3ML SOLN  [id number] Yes Take 3 mLs by nebulization every 6 (six) hours as needed (shortness of breath). [provider] Taking Active Self           Med Note Jilda Roche A   Fri Dec 24, 2020  4:04 PM)    Lanell Matar THIN MISC [id number]  1 Device by Does not apply route 4 (four) times daily -  before meals and at bedtime. [name], MD  Active Self  losartan (COZAAR) 25 MG tablet [id number] Yes Take 0.5 tablets (12.5 mg total) by mouth daily. [name], MD Taking Active   metFORMIN (GLUCOPHAGE) 500 MG tablet [id number] Yes Take 2 tablets (1,000 mg total) by mouth 2 (two) times daily with a meal. [name], [name], DO Taking Active   methocarbamol (ROBAXIN) 500 MG tablet [id number] Yes Take 1 tablet (500 mg total) by mouth 4 (four) times daily. Loleta Dicker, PA Taking Active   metoprolol tartrate (LOPRESSOR) 50 MG tablet [id number] Yes TAKE 1 & 1/2 (ONE & ONE-HALF) TABLETS BY MOUTH TWICE DAILY . APPOINTMENT REQUIRED FOR FUTURE  REFILLS Rise Mu, [name] Taking Active Self  montelukast (SINGULAIR) 10 MG tablet [id number] Yes Take 10 mg by mouth at bedtime. [provider] Taking Active Self  Donald Siva test strip [id number]  USE STRIP TO CHECK GLUCOSE TWICE DAILY [name] [name], DO  Active Self  spironolactone (ALDACTONE) 25 MG tablet [id number] Yes Take 0.5 tablets (12.5 mg total) by mouth daily. Kathlen Mody, [name], [name] Taking Active Self  SYMBICORT 160-4.5 MCG/ACT inhaler [id number] Yes Inhale 2 puffs 2 (two) times daily into the lungs.  [provider] Taking Active Self            Patient Active Problem List   Diagnosis Date Noted   DDD (degenerative disc disease), lumbar 01/31/2021   Chronic right hip pain 11/19/2019   Thrush    MSSA bacteremia    Chronic systolic CHF (congestive heart failure) (HCC)    AF (paroxysmal atrial fibrillation) ([place])    HFrEF (heart failure with reduced ejection fraction) ([place])    Community acquired pneumonia of left lower lobe of lung    COPD with acute exacerbation ([place]) 06/26/2019   Overweight (BMI 25.0-29.9) 07/01/2018   Osteoarthritis of multiple joints 05/14/2017   Chronic pain of left knee 05/14/2017   OSA (obstructive sleep apnea) 02/28/2017   CKD (chronic kidney disease), stage II 02/28/2017   Chronic anticoagulation 02/28/2017   Onychomycosis of left great toe 08/14/2016   Allergic rhinitis due to allergen 07/31/2016   Peripheral neuropathy 08/17/2015   Type 2 diabetes mellitus with peripheral neuropathy ([place]) 08/17/2015   (HFpEF) heart failure with preserved ejection fraction ([place]) 07/25/2015   Atrial fibrillation with rapid ventricular response ([place]) 12/10/2014   Centrilobular emphysema ([place]) 11/09/2014   Bradycardia 11/09/2014   Cardiac conduction disorder 11/09/2014   Benign hypertension with CKD (chronic kidney disease), stage II 11/09/2014   Bursitis of hip 11/09/2014   Enlarged prostate 11/09/2014   Coronary artery disease     S/P CABG x 3 05/12/2012   Hypothyroid 05/06/2012   Hyperlipidemia associated with type 2 diabetes mellitus ([place]) 03/31/2012   Colon polyp 12/21/2003    Immunization History  Administered Date(s) Administered   Fluad Quad(high Dose 65+) 12/31/2018, 01/31/2021   Influenza, High Dose Seasonal PF 01/06/2015, 02/28/2017   Influenza-Unspecified 02/08/2014, 03/01/2018   Pneumococcal Conjugate-13 01/02/2014   Pneumococcal Polysaccharide-23 05/01/2012   Tdap 05/01/2012    Conditions to be addressed/monitored: Atrial  Fibrillation, HTN, HLD, COPD, hypothyroidism and DMII  Care Plan : PharmD - Medication Management/Medication Assistance  Updates made by [name], RPH-CPP since 02/09/2021 12:00 AM     Problem: Disease Progression      Long-Range Goal: Disease Progression Prevented or Minimized   Start Date: 06/16/2020  Expected End Date: 09/14/2020  This Visit's Progress: On track  Recent Progress: On track  Priority: High  Note:   Current Barriers:  Knowledge deficits related to dietary choices to improve blood sugar control Financial - difficulty with affording [name] through Medicare Part D health plan Patient APPROVED for patient assistance for Farxiga from [employer] through 04/30/2021 Patient APPROVED for patient assistance for Eliquis from [employer] from 01/06/21-04/30/21  Pharmacist Clinical Goal(s):  Over the next 90 days, patient will achieve control of T2DM as evidenced by A1C through collaboration with PharmD and provider.   Interventions: 1:1 collaboration with [name], DO regarding development and update of comprehensive plan of care as evidenced by provider attestation and co-signature Inter-disciplinary care team collaboration (see longitudinal plan of care) Perform chart review Office Visit with PCP on 10/3 A1C improved to 6.8% Office Visit with [facility] on 10/11 Reports recovering from back surgery, but continues to have pain  which is managed with Tylenol 500 mg -2 tablets (1000 mg) every 8 hours as needed, methocarbamol 500 mg ~ twice daily as needed Reports discussed with Orthopedics yesterday plan to have right knee surgery once cleared by Dr. Cari Caraway Comprehensive medication review performed; medication list updated in electronic medical record Reports restarted Eliquis 3m twice daily as directed on hospital discharge instruction he received from Dr. YCari Carawayon 9/9  Type 2 Diabetes Controlled; current treatment: metformin 500 mg - 2 tablets (1,000 mg) twice daily as directed Farxiga 10 mg - 1 tablet QAM   Review recent blood sugar results:  Fasting Blood Glucose After Supper (~2 hours)  6 - October 124 146  7 - October 117 135  8 - October 120 126  9 - October 127 114  10 - October 130 247  11 - October 126 163  12 - October -   Average 124 155  Denies s/s of hypoglycemia  CHF/HTN Current treatment: Losartan 25 mg - 1/2 tablet (12.5 mg) once daily Metoprolol 75 mg twice daily Spironolactone 25 mg - 1/2 tablet (12.5 mg) daily (started last week) Farxiga 10 mg - 1 tablets daily Furosemide 40 mg daily as needed as directed by Cardiologist Reports recent BP results:  AM BP PM BP  6 - October 130/92, HR 91 115/73, HR 70  7 - October 130/88, HR 69 103/75, HR 87  8 - October 128/86, HR 72 100/73, HR 66  9 - October 138/93, HR 77 94/69, HR 71  10 - October 137/87, HR 63 111/80, HR 64  11 - October 134/83, HR 78 108/75, HR 75  12 - October -   Denies s/s of hypotension or hypertension Note patient weighs himself daily and follows directions from Cardiologist for use of PRN furosemide (Lasix).  Have counseled on importance of contacting Cardiologist for BP and HR readings outside of established parameters or new/worsening symtpoms  COPD/seasonal allergies: [facility] Current treatment: Symbicort 160/4.5 two puffs bid Montelukast 10 mg  QHS Albuterol/ipratropium nebulizer solution/ albuterol inhaler as needed Cetirizine 10 mg daily Flonase nasal spray as needed Using saline Nav[place] pods Counsel on importance of using maintenance (Symbicort) inhaler twice daily and rinsing  mouth out after each use and using rescue inhaler (albuterol) as needed Reports recently has noticed an increase in mucus production and describes mucus color as yellow/grey. Denies increase in shortness of breath, more diffiuclty with sleep or other symptoms. Denies need to discuss with Pulmonology today, but will monitor, discuss with Pulmonolgist at upcoming appointment on 10/18 and call office sooner for worsening or new symptoms  Medication/Device Adherence Note patient uses weekly pillbox to organize medications   Patient Goals/Self-Care Activities Over the next 90 days, patient will:  Self administers medications as prescribed Attends all scheduled provider appointments Next appointment with Pulmonology on 10/18 Next appointment with Neurosurgery on 10/20 Next appointment with Cardiology on 11/11 Calls pharmacy for medication refills Calls provider office for new concerns or questions  Patient to check blood sugar regularly as directed and keep log Patient to keep log when checks blood pressure  Follow Up Plan: Telephone follow up appointment with care management team member scheduled for: 11/16 at 9:15 am      Patient's preferred pharmacy is:  [facility] [address] [place]) [place] [address] Phone: [phone] Fax: [phone]   Follow Up:  Patient agrees to Care Plan and Follow-up.  Wallace Cullens, PharmD, [place], CPP Clinical Pharmacist [facility] [phone]

## 2021-02-13 DIAGNOSIS — M1711 Unilateral primary osteoarthritis, right knee: Secondary | ICD-10-CM | POA: Insufficient documentation

## 2021-02-15 ENCOUNTER — Other Ambulatory Visit: Payer: Self-pay | Admitting: Physician Assistant

## 2021-02-15 DIAGNOSIS — J310 Chronic rhinitis: Secondary | ICD-10-CM | POA: Diagnosis not present

## 2021-02-15 DIAGNOSIS — J449 Chronic obstructive pulmonary disease, unspecified: Secondary | ICD-10-CM | POA: Diagnosis not present

## 2021-02-15 DIAGNOSIS — G4733 Obstructive sleep apnea (adult) (pediatric): Secondary | ICD-10-CM | POA: Diagnosis not present

## 2021-02-15 DIAGNOSIS — R0609 Other forms of dyspnea: Secondary | ICD-10-CM | POA: Diagnosis not present

## 2021-02-24 ENCOUNTER — Other Ambulatory Visit: Payer: Self-pay | Admitting: Family Medicine

## 2021-02-24 DIAGNOSIS — E1142 Type 2 diabetes mellitus with diabetic polyneuropathy: Secondary | ICD-10-CM

## 2021-02-24 NOTE — Telephone Encounter (Signed)
Requested Prescriptions  Pending Prescriptions Disp Refills  . ONETOUCH ULTRA test strip [employer] Med Name: OneTouch Ultra Blue In Vitro Strip] 150 each 0    Sig: USE TO CHECK BLOOD SUGAR TWICE DAILY     Endocrinology: Diabetes - Testing Supplies Passed - 02/24/2021 11:21 AM      Passed - Valid encounter within last 12 months    Recent Outpatient Visits          3 weeks ago Type 2 diabetes mellitus with peripheral neuropathy [facility])   [facility], [name], DO   8 months ago Type 2 diabetes mellitus with hyperlipidemia [facility])   [place], DO   9 months ago Acute idiopathic gout of right ankle   [place], DO   1 year ago Acute conjunctivitis of both eyes, unspecified acute conjunctivitis type   [place], DO   1 year ago Chronic right hip pain   [place], DO      Future Appointments            In 2 weeks Kathlen Mody, [place], [name] [place], [facility]   In 5 months [name], [name], [facility], [place]   In 5 months  [facility], [place]

## 2021-02-25 NOTE — Discharge Instructions (Signed)
Instructions after Total Knee Replacement   Dairon Procter P. [place] Nordquist, Jr., M.D.     Dept. of Orthopaedics & Sports Medicine  [facility]  [address]  [place], [place]  [address]  Phone: [phone]   Fax: [phone]    DIET: Drink plenty of non-alcoholic fluids. Resume your normal diet. Include foods high in fiber.  ACTIVITY:  You may use crutches or a walker with weight-bearing as tolerated, unless instructed otherwise. You may be weaned off of the walker or crutches by your Physical Therapist.  Do NOT place pillows under the knee. Anything placed under the knee could limit your ability to straighten the knee.   Continue doing gentle exercises. Exercising will reduce the pain and swelling, increase motion, and prevent muscle weakness.   Please continue to use the TED compression stockings for 6 weeks. You may remove the stockings at night, but should reapply them in the morning. Do not drive or operate any equipment until instructed.  WOUND CARE:  Continue to use the PolarCare or ice packs periodically to reduce pain and swelling. You may bathe or shower after the staples are removed at the first office visit following surgery.  MEDICATIONS: You may resume your regular medications. Please take the pain medication as prescribed on the medication. Do not take pain medication on an empty stomach. You have been given a prescription for a blood thinner (Lovenox or Coumadin). Please take the medication as instructed. (NOTE: After completing a 2 week course of Lovenox, take one Enteric-coated aspirin once a day. This along with elevation will help reduce the possibility of phlebitis in your operated leg.) Do not drive or drink alcoholic beverages when taking pain medications.  CALL THE OFFICE FOR: Temperature above 101 degrees Excessive bleeding or drainage on the dressing. Excessive swelling, coldness, or paleness of the toes. Persistent nausea and vomiting.  FOLLOW-UP:  You  should have an appointment to return to the office in 10-14 days after surgery. Arrangements have been made for continuation of Physical Therapy (either home therapy or outpatient therapy).   [facility] Department Directory         [email or web address]       [email or web address]          Cardiology  Appointments: [place] - [phone] [place] - [phone]  Endocrinology  Appointments: [place] - [phone] [place] - [phone]  Gastroenterology  Appointments: [place] - [phone] [place] - [phone]        General Surgery   Appointments: [place] - [phone]  Internal Medicine/Family Medicine  Appointments: [place] - [phone] [place] - [phone] [place] - [phone]  Metabolic and Weigh Loss Surgery  Appointments: [place] - [phone]        Neurology  Appointments: [place] - [phone] [place] - [phone]  Neurosurgery  Appointments: [place] - [phone]  Obstetrics & Gynecology  Appointments: [place] - [phone] [place] - [phone]        Pediatrics  Appointments: [place] - [phone] [place] - [phone]  Physiatry  Appointments: [place] -[phone]  Physical Therapy  Appointments: [place] - [phone] [place] - [phone]        Podiatry  Appointments: [place] - [phone] [place] - [phone]  Pulmonology  Appointments: [place] - [phone]  Rheumatology  Appointments: [place] - [phone]        [place] Location: [facility]  [address] , [place]  [address]  [place] Location: [facility] [address] [place], [place]  [address]  [place] Location: [facility] [address] [place], [place]  [address]    

## 2021-02-28 DIAGNOSIS — N182 Chronic kidney disease, stage 2 (mild): Secondary | ICD-10-CM | POA: Diagnosis not present

## 2021-02-28 DIAGNOSIS — E1142 Type 2 diabetes mellitus with diabetic polyneuropathy: Secondary | ICD-10-CM

## 2021-02-28 DIAGNOSIS — I129 Hypertensive chronic kidney disease with stage 1 through stage 4 chronic kidney disease, or unspecified chronic kidney disease: Secondary | ICD-10-CM

## 2021-02-28 DIAGNOSIS — J432 Centrilobular emphysema: Secondary | ICD-10-CM

## 2021-03-02 ENCOUNTER — Telehealth: Payer: Self-pay | Admitting: *Deleted

## 2021-03-02 ENCOUNTER — Other Ambulatory Visit
Admission: RE | Admit: 2021-03-02 | Discharge: 2021-03-02 | Disposition: A | Discharge status: Home / Self Care | Payer: Medicare Other | Source: Ambulatory Visit | Attending: Orthopedic Surgery | Admitting: Orthopedic Surgery

## 2021-03-02 ENCOUNTER — Other Ambulatory Visit: Payer: Self-pay

## 2021-03-02 DIAGNOSIS — M1711 Unilateral primary osteoarthritis, right knee: Secondary | ICD-10-CM | POA: Insufficient documentation

## 2021-03-02 DIAGNOSIS — Z01812 Encounter for preprocedural laboratory examination: Secondary | ICD-10-CM | POA: Diagnosis not present

## 2021-03-02 DIAGNOSIS — D126 Benign neoplasm of colon, unspecified: Secondary | ICD-10-CM | POA: Insufficient documentation

## 2021-03-02 DIAGNOSIS — I480 Paroxysmal atrial fibrillation: Secondary | ICD-10-CM | POA: Diagnosis not present

## 2021-03-02 DIAGNOSIS — I5022 Chronic systolic (congestive) heart failure: Secondary | ICD-10-CM | POA: Diagnosis not present

## 2021-03-02 DIAGNOSIS — Z7901 Long term (current) use of anticoagulants: Secondary | ICD-10-CM | POA: Diagnosis not present

## 2021-03-02 DIAGNOSIS — E1142 Type 2 diabetes mellitus with diabetic polyneuropathy: Secondary | ICD-10-CM | POA: Diagnosis not present

## 2021-03-02 DIAGNOSIS — Z880 Allergy status to penicillin: Secondary | ICD-10-CM | POA: Diagnosis not present

## 2021-03-02 LAB — URINALYSIS, ROUTINE W REFLEX MICROSCOPIC
Bacteria, UA: NONE SEEN
Bilirubin Urine: NEGATIVE
Glucose, UA: >=500 mg/dL — AB
Hgb urine dipstick: NEGATIVE
Ketones, ur: NEGATIVE mg/dL
Nitrite: NEGATIVE
Protein, ur: NEGATIVE mg/dL
Specific Gravity, Urine: 1.016 (ref 1.005–1.030)
pH: 5 (ref 5.0–8.0)

## 2021-03-02 LAB — CBC
HCT: 47.4 % (ref 39.0–52.0)
Hemoglobin: 15.7 g/dL (ref 13.0–17.0)
MCH: 30.6 pg (ref 26.0–34.0)
MCHC: 33.1 g/dL (ref 30.0–36.0)
MCV: 92.4 fL (ref 80.0–100.0)
Platelets: 200 10*3/uL (ref 150–400)
RBC: 5.13 MIL/uL (ref 4.22–5.81)
RDW: 13.5 % (ref 11.5–15.5)
WBC: 7.1 10*3/uL (ref 4.0–10.5)
nRBC: 0 % (ref 0.0–0.2)

## 2021-03-02 LAB — APTT: aPTT: 32 seconds (ref 24–36)

## 2021-03-02 LAB — SURGICAL PCR SCREEN
MRSA, PCR: NEGATIVE
Staphylococcus aureus: NEGATIVE

## 2021-03-02 LAB — COMPREHENSIVE METABOLIC PANEL
ALT: 20 U/L (ref 0–44)
AST: 20 U/L (ref 15–41)
Albumin: 4.1 g/dL (ref 3.5–5.0)
Alkaline Phosphatase: 73 U/L (ref 38–126)
Anion gap: 8 (ref 5–15)
BUN: 17 mg/dL (ref 8–23)
CO2: 26 mmol/L (ref 22–32)
Calcium: 9.2 mg/dL (ref 8.9–10.3)
Chloride: 107 mmol/L (ref 98–111)
Creatinine, Ser: 0.92 mg/dL (ref 0.61–1.24)
GFR, Estimated: >60 mL/min (ref >60)
Glucose, Bld: 109 mg/dL — ABNORMAL HIGH (ref 70–99)
Potassium: 4.4 mmol/L (ref 3.5–5.1)
Sodium: 141 mmol/L (ref 135–145)
Total Bilirubin: 0.8 mg/dL (ref 0.3–1.2)
Total Protein: 6.9 g/dL (ref 6.5–8.1)

## 2021-03-02 LAB — TYPE AND SCREEN
ABO/RH(D): A POS
Antibody Screen: NEGATIVE

## 2021-03-02 LAB — C-REACTIVE PROTEIN: CRP: 0.6 mg/dL (ref <1.0)

## 2021-03-02 LAB — SEDIMENTATION RATE: Sed Rate: 2 mm/hr (ref 0–20)

## 2021-03-02 LAB — PROTIME-INR
INR: 1.2 (ref 0.8–1.2)
Prothrombin Time: 15.5 seconds — ABNORMAL HIGH (ref 11.4–15.2)

## 2021-03-02 NOTE — Progress Notes (Signed)
Perioperative Services Pre-Admission/Anesthesia Testing    Date: 03/02/21  Name: Philip KIDNEY Sr. MRN:   [id number]  Re: Review and clearance for surgery  Planned Surgical Procedure(s):    Case: [id number] Date/Time: 03/07/21 0830   Procedure: COMPUTER ASSISTED TOTAL KNEE ARTHROPLASTY - (Right: Knee) - RNFA   Anesthesia type: Choice   Pre-op diagnosis: PRIMARY OSTEOARTHRITIS OF RIGHT KNEE.   Location: [facility] OR ROOM 01 / [facility]   Surgeons: Dereck Leep, MD   Clinical Notes:  Patient scheduled for the above procedure on 03/07/2021 with Dr. Skip Estimable, MD.  Patient previously reviewed by PAT APP prior to spinal surgery in 12/2020 see my note dated 12/31/2020. Interval health history/records reviewed.   Patient underwent an uncomplicated RIGHT N8-6 MICRODISCECTOMY on 01/07/2021. He discharged home on POD-0. Notes from follow up appointments with neurosurgery indicated that patient was doing well overall and recovering as expected postoperatively. Patient able to participate in physical therapy as ordered.   Patient previously cleared by cardiology prior to his most recent procedure. With that being said, given his health history and length of time since the initial clearance from cardiology was issued, updated clearance from her cardiology team was sought by the PAT team. Per cardiology, "based ACC/AHA guidelines, the patient's past medical history, and the amount of time since his last clinic visit, this patient would be at an overall ACCEPTABLE risk for the planned procedure without further cardiovascular testing or intervention at this time". Philip Potters Sr. has been cleared by cardiology to hold his daily apixaban dose x 3 days prior to his procedure with plans to restart as soon as postoperative bleeding risk felt to be minimized by his primary attending surgeon.  Patient is aware that his last dose of apixaban will be on 03/03/2021.  Pertinent Clinical  Results:   Hospital Outpatient Visit on 03/02/2021  Component Date Value Ref Range Status   ABO/RH(D) 03/02/2021 A POS   Final   Antibody Screen 03/02/2021 NEG   Final   Sample Expiration 03/02/2021 03/16/2021,2359   Final   Extend sample reason 03/02/2021    Final                   Value:NO TRANSFUSIONS OR PREGNANCY IN THE PAST 3 MONTHS Performed at [facility], [address] [place], [place] [address]    CRP 03/02/2021 0.6  <1.0 mg/dL Final   Performed at [facility], [place] [address] [place], [place] [address]   Sed Rate 03/02/2021 2  0 - 20 mm/hr Final   Performed at [facility], [address] [place], [place] [address]   MRSA, PCR 03/02/2021 NEGATIVE  NEGATIVE Final   Staphylococcus aureus 03/02/2021 NEGATIVE  NEGATIVE Final   Comment: (NOTE) The Xpert SA Assay (FDA approved for NASAL specimens in patients 38 years of age and older), is one component of a comprehensive surveillance program. It is not intended to diagnose infection nor to guide or monitor treatment. Performed at [facility], [address] [place], [place] [address]   WBC 03/02/2021 7.1  4.0 - 10.5 K/uL Final   RBC 03/02/2021 5.13  4.22 - 5.81 MIL/uL Final   Hemoglobin 03/02/2021 15.7  13.0 - 17.0 g/dL Final   HCT 03/02/2021 47.4  39.0 - 52.0 % Final   MCV 03/02/2021 92.4  80.0 - 100.0 fL Final   MCH 03/02/2021 30.6  26.0 - 34.0 pg Final   MCHC 03/02/2021 33.1  30.0 - 36.0 g/dL Final   RDW 03/02/2021 13.5  11.5 - 15.5 % Final   Platelets 03/02/2021 200  150 - 400 K/uL Final   nRBC 03/02/2021 0.0  0.0 - 0.2 % Final   Performed at [facility], [place], [place] [address]   Sodium 03/02/2021 141  135 - 145 mmol/L Final   Potassium 03/02/2021 4.4  3.5 - 5.1 mmol/L Final   Chloride 03/02/2021 107  98 - 111 mmol/L Final   CO2 03/02/2021 26  22 - 32 mmol/L Final   Glucose, Bld 03/02/2021 109 (A)  70 - 99 mg/dL Final   Glucose reference range  applies only to samples taken after fasting for at least 8 hours.   BUN 03/02/2021 17  8 - 23 mg/dL Final   Creatinine, Ser 03/02/2021 0.92  0.61 - 1.24 mg/dL Final   Calcium 03/02/2021 9.2  8.9 - 10.3 mg/dL Final   Total Protein 03/02/2021 6.9  6.5 - 8.1 g/dL Final   Albumin 03/02/2021 4.1  3.5 - 5.0 g/dL Final   AST 03/02/2021 20  15 - 41 U/L Final   ALT 03/02/2021 20  0 - 44 U/L Final   Alkaline Phosphatase 03/02/2021 73  38 - 126 U/L Final   Total Bilirubin 03/02/2021 0.8  0.3 - 1.2 mg/dL Final   GFR, Estimated 03/02/2021 >60  >60 mL/min Final   Comment: (NOTE) Calculated using the CKD-EPI Creatinine Equation (2021)    Anion gap 03/02/2021 8  5 - 15 Final   Performed at [facility], [address] [place], [place] [address]   Prothrombin Time 03/02/2021 15.5 (A)  11.4 - 15.2 seconds Final   INR 03/02/2021 1.2  0.8 - 1.2 Final   Comment: (NOTE) INR goal varies based on device and disease states. Performed at [facility], [address] [place], [place] [address]    aPTT 03/02/2021 32  24 - 36 seconds Final   Performed at [facility], [place], [place] [address]   Color, Urine 03/02/2021 YELLOW (A)  YELLOW Final   APPearance 03/02/2021 CLEAR (A)  CLEAR Final   Specific Gravity, Urine 03/02/2021 1.016  1.005 - 1.030 Final   pH 03/02/2021 5.0  5.0 - 8.0 Final   Glucose, UA 03/02/2021 >=500 (A)  NEGATIVE mg/dL Final   Hgb urine dipstick 03/02/2021 NEGATIVE  NEGATIVE Final   Bilirubin Urine 03/02/2021 NEGATIVE  NEGATIVE Final   Ketones, ur 03/02/2021 NEGATIVE  NEGATIVE mg/dL Final   Protein, ur 03/02/2021 NEGATIVE  NEGATIVE mg/dL Final   Nitrite 03/02/2021 NEGATIVE  NEGATIVE Final   Leukocytes,Ua 03/02/2021 SMALL (A)  NEGATIVE Final   RBC / HPF 03/02/2021 0-5  0 - 5 RBC/hpf Final   WBC, UA 03/02/2021 0-5  0 - 5 WBC/hpf Final   Bacteria, UA 03/02/2021 NONE SEEN  NONE SEEN Final   Squamous Epithelial / LPF 03/02/2021 0-5  0 - 5  Final   Mucus 03/02/2021 PRESENT   Final   Performed at [facility], [address] [place], [place] [address]    ECG: Date: 12/01/2020 Time ECG obtained: 1017 AM Rate: 78 bpm Rhythm: atrial fibrillation Axis (leads I and aVF): Normal Intervals: QRS 88 ms. QTc 456 ms. ST segment and T wave changes: No evidence of acute ST segment elevation or depression Comparison: Similar to previous tracing obtained on 05/31/2020    IMAGING / PROCEDURES: DIAGNOSTIC RADIOGRAPHS OF RIGHT KNEE 3 VIEWS performed on 02/08/2021  Narrowing of the medial cartilage space with near bone-on-bone articulation and associated varus alignment.   Osteophyte formation is noted.  Subchondral sclerosis is noted.  Degenerative changes to the patellofemoral articulation are noted.  No evidence of fracture or dislocation.  MRI LUMBAR SPINE WO CONTRAST performed on 12/10/2020 Progressive right paramedian disc protrusion at L3-4 with severe right subarticular and moderate right foraminal stenosis. Mild left foraminal narrowing at L3-4 Mild subarticular narrowing at L4-5 is worse on the left Moderate left and mild right foraminal stenosis at L4-5 is similar to the prior study. Left foraminal stenosis at L5-S1 is similar to the prior study.  TRANSTHORACIC ECHOCARDIOGRAM performed on 09/11/2019 Left ventricular ejection fraction, by estimation, is 40 to 45%. The left ventricle has mildly decreased function. The left ventricle demonstrates global hypokinesis. Left ventricular diastolic parameters are indeterminate. Right ventricular systolic function is normal. The right ventricular size is normal. Tricuspid regurgitation signal is inadequate for assessing PA pressure. Left atrial size was moderately dilated.  The mitral valve is normal in structure. Moderate mitral valve regurgitation. No evidence of mitral stenosis.  Tricuspid valve regurgitation is mild to moderate.    LEXISCAN performed on  07/28/2019 Normal left ventricular systolic function with an EF of 63%  No evidence of stress-induced myocardial ischemia or arrhythmia  Normal low risk study    TRANSESOPHAGEAL ECHOCARDIOGRAM performed on 07/04/2019 Left ventricular ejection fraction, by estimation, is 30 to 35%. The left ventricle has moderately decreased function. The left ventricle demonstrates global hypokinesis.  Right ventricular systolic function is mildly reduced. The right ventricular size is mildly enlarged.  No left atrial/left atrial appendage thrombus was detected.  The mitral valve is degenerative. Mild to moderate mitral valve regurgitation. No evidence of mitral stenosis.  The aortic valve is tricuspid. Aortic valve regurgitation is not visualized. Mild aortic valve sclerosis is present, with no evidence of aortic valve stenosis.  There is mild (Grade II) plaque involving the descending aorta.    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 has been referred for pre-anesthesia review and clearance prior to him undergoing the planned anesthetic and procedural courses. Available labs, pertinent testing, and imaging results were personally reviewed by me. This patient has been  appropriately cleared by cardiology with an overall ACCEPTABLE risk of significant perioperative cardiovascular complications.  Based on clinical review performed today (03/02/21), barring any significant acute changes in the patient's overall condition, it is anticipated that he will be able to proceed with the planned surgical intervention. Any acute changes in clinical condition may necessitate his procedure being postponed and/or cancelled. Patient will meet with anesthesia team (MD and/or CRNA) on this day of his procedure for preoperative evaluation/assessment.   Pre-surgical instructions were reviewed with the patient during his PAT appointment and questions were fielded by PAT clinical staff. Patient was  advised that if any questions or concerns arise prior to his procedure then he should return a call to PAT and/or his surgeon's office to discuss.  Honor Loh, MSN, APRN, FNP-C, CEN [facility]  Peri-operative Services Nurse Practitioner Phone: [phone] 03/02/21 3:08 PM  NOTE: This note has been prepared using Dragon dictation software. Despite my best ability to proofread, there is always the potential that unintentional transcriptional errors may still occur from this process.

## 2021-03-02 NOTE — Telephone Encounter (Signed)
Request for pre-operative cardiac clearance Received: Today Karen Kitchens, NP  P Cv Div Preop Callback Request for pre-operative cardiac clearance:     1. What type of surgery is being performed?  RIGHT COMPUTER ASSISTED TOTAL KNEE ARTHROPLASTY   2. When is this surgery scheduled?  03/07/2021     3. Are there any medications that need to be held prior to surgery?  Apixaban   4. Practice name and name of physician performing surgery?  Performing surgeon: Dr. Skip Estimable, MD  Requesting clearance: Honor Loh, FNP-C       5. Anesthesia type (none, local, MAC, general)? General   6. What is the office phone and fax number?    Phone: [phone]  Fax: [phone]   ATTENTION: Unable to create telephone message as per your standard workflow. Directed by [facility] providers to send requests for cardiac clearance to this pool for appropriate distribution to provider covering pre-operative clearances.   Honor Loh, MSN, APRN, FNP-C, CEN  [facility]  Peri-operative Services Nurse Practitioner  Phone: [phone]  03/02/21 2:55 PM

## 2021-03-02 NOTE — Patient Instructions (Addendum)
Your procedure is scheduled on:03-07-21 Monday Report to the Registration Desk on the 1st floor of the [facility].Then proceed to the 2nd floor Surgery Desk in the [facility] To find out your arrival time, please call [phone] between 1PM - 3PM on:03-04-21 Friday  REMEMBER: Instructions that are not followed completely may result in serious medical risk, up to and including death; or upon the discretion of your surgeon and anesthesiologist your surgery may need to be rescheduled.  Do not eat food after midnight the night before surgery.  No gum chewing, lozengers or hard candies.  You may however, drink Water up to 2 hours before you are scheduled to arrive for your surgery. Do not drink anything within 2 hours of your scheduled arrival time.  Type 1 and Type 2 diabetics should only drink water.  TAKE THESE MEDICATIONS THE MORNING OF SURGERY WITH A SIP OF WATER: -cetirizine (ZYRTEC) 10 MG tablet -EUTHYROX (Levothyroxine) 88 MCG tablet -metoprolol tartrate (LOPRESSOR) 50 MG tablet  Use your SYMBICORT 160-4.5 MCG/ACT inhaler and ipratropium-albuterol (DUONEB) 0.5-2.5 (3) MG/3ML SOLNthe day of surgery and bring Albuterol Inhaler to the hopsital  Stop apixaban (ELIQUIS) 5 MG TABS tablet 3 days prior to surgery per Dr Mallie Mussel dose on 03-03-21 (Thursday)  Stop FARXIGA 10 MG TABS tablet 3 days prior to surgery-Last dose on 03-03-21 (Thursday)  Stop metFORMIN (GLUCOPHAGE) 500 MG tablet 2 days prior to surgery-Last dose on 03-04-21 (Friday)   One week prior to surgery: Stop Anti-inflammatories (NSAIDS) such as Advil, Aleve, Ibuprofen, Motrin, Naproxen, Naprosyn and Aspirin based products such as Excedrin, Goodys Powder, BC Powder.You may however, continue to take Tylenol if needed for pain up until the day of surgery.  Stop ANY OVER THE COUNTER supplements/vitamins NOW (03-02-21) until after surgery  No Alcohol for 24 hours before or after surgery.  No Smoking including e-cigarettes  for 24 hours prior to surgery.  No chewable tobacco products for at least 6 hours prior to surgery.  No nicotine patches on the day of surgery.  Do not use any "recreational" drugs for at least a week prior to your surgery.  Please be advised that the combination of cocaine and anesthesia may have negative outcomes, up to and including death. If you test positive for cocaine, your surgery will be cancelled.  On the morning of surgery brush your teeth with toothpaste and water, you may rinse your mouth with mouthwash if you wish. Do not swallow any toothpaste or mouthwash.  Use CHG Soap as directed on instruction sheet.  Do not wear jewelry, make-up, hairpins, clips or nail polish.  Do not wear lotions, powders, or perfumes.   Do not shave body from the neck down 48 hours prior to surgery just in case you cut yourself which could leave a site for infection.  Also, freshly shaved skin may become irritated if using the CHG soap.  Contact lenses, hearing aids and dentures may not be worn into surgery.  Do not bring valuables to the hospital. [facility] is not responsible for any missing/lost belongings or valuables.   Notify your doctor if there is any change in your medical condition (cold, fever, infection).  Wear comfortable clothing (specific to your surgery type) to the hospital.  After surgery, you can help prevent lung complications by doing breathing exercises.  Take deep breaths and cough every 1-2 hours. Your doctor may order a device called an Incentive Spirometer to help you take deep breaths. When coughing or sneezing, hold a pillow  firmly against your incision with both hands. This is called "splinting." Doing this helps protect your incision. It also decreases belly discomfort.  If you are being admitted to the hospital overnight, leave your suitcase in the car. After surgery it may be brought to your room.  If you are being discharged the day of surgery, you will not  be allowed to drive home. You will need a responsible adult (18 years or older) to drive you home and stay with you that night.   If you are taking public transportation, you will need to have a responsible adult (18 years or older) with you. Please confirm with your physician that it is acceptable to use public transportation.   Please call the [facility] Dept. at [phone] if you have any questions about these instructions.  Surgery Visitation Policy:  Patients undergoing a surgery or procedure may have one family member or support person with them as long as that person is not COVID-19 positive or experiencing its symptoms.  That person may remain in the waiting area during the procedure and may rotate out with other people.  Inpatient Visitation:    Visiting hours are 7 a.m. to 8 p.m. Up to two visitors ages 16+ are allowed at one time in a patient room. The visitors may rotate out with other people during the day. Visitors must check out when they leave, or other visitors will not be allowed. One designated support person may remain overnight. The visitor must pass COVID-19 screenings, use hand sanitizer when entering and exiting the patient's room and wear a mask at all times, including in the patient's room. Patients must also wear a mask when staff or their visitor are in the room. Masking is required regardless of vaccination status.

## 2021-03-03 ENCOUNTER — Other Ambulatory Visit
Admission: RE | Admit: 2021-03-03 | Discharge: 2021-03-03 | Disposition: A | Discharge status: Home / Self Care | Payer: Medicare Other | Source: Ambulatory Visit | Attending: Orthopedic Surgery | Admitting: Orthopedic Surgery

## 2021-03-03 DIAGNOSIS — Z20822 Contact with and (suspected) exposure to covid-19: Secondary | ICD-10-CM | POA: Diagnosis not present

## 2021-03-03 DIAGNOSIS — Z01812 Encounter for preprocedural laboratory examination: Secondary | ICD-10-CM | POA: Diagnosis not present

## 2021-03-03 NOTE — Telephone Encounter (Signed)
Patient with diagnosis of A Fib on Eliquis for anticoagulation.    Procedure: RIGHT COMPUTER ASSISTED TOTAL KNEE ARTHROPLASTY  Date of procedure: 02/04/21   CHA2DS2-VASc Score = 5  This indicates a 7.2% annual risk of stroke. The patient's score is based upon: CHF History: 1 HTN History: 1 Diabetes History: 1 Stroke History: 0 Vascular Disease History: 1 Age Score: 1 Gender Score: 0    CrCl 88 mL/min Platelet count 200K  Per office protocol, patient can hold Eliquis for 3 days prior to procedure.

## 2021-03-03 NOTE — Telephone Encounter (Signed)
I called pt and wife's phone numbers - neither had VM setup. No MC.

## 2021-03-04 LAB — SARS CORONAVIRUS 2 (TAT 6-24 HRS): SARS Coronavirus 2: NEGATIVE

## 2021-03-04 NOTE — Telephone Encounter (Signed)
   Name: Philip [name]  DOB: Oct 16, 1945  MRN: [id number]   Primary Cardiologist: [name], MD  Chart reviewed as part of pre-operative protocol coverage. Patient was contacted 03/04/2021 in reference to pre-operative risk assessment for pending surgery as outlined be/low.  Philip Potters Sr. was last seen on 11/2020 by Dr. Fletcher Anon.  Since that day, Philip ARLINGTON Sr. has done fine from a cardiac standpoint.  He has chronic DOE which has been at baseline and unchanged over the past several weeks/months.  Despite his knee pain he is able to complete 4 METS without anginal complaints.  Therefore, based on ACC/AHA guidelines, the patient would be at acceptable risk for the planned procedure without further cardiovascular testing.   The patient was advised that if he develops new symptoms prior to surgery to contact our office to arrange for a follow-up visit, and he verbalized understanding.  Per pharmacy recommendation, patient can hold Eliquis 3 days prior to his upcoming surgery with plans to restart as soon as he is cleared to do so by his orthopedist.  I will route this recommendation to the requesting party via [facility] fax function and remove from pre-op pool. Please call with questions.  Abigail Butts, [name] 03/04/2021, 10:39 AM

## 2021-03-04 NOTE — H&P (Signed)
ORTHOPAEDIC HISTORY & PHYSICAL Yuvaan Olander, Florinda Marker., MD - 02/08/2021 11:00 AM EDT Formatting of this note is different from the original. Images from the original note were not included. Chief Complaint: Chief Complaint  Patient presents with   Knee Pain  Right knee degenerative arthrosis   Reason for Visit: The patient is a 75 y.o. male who presents today for reevaluation of his right knee. He reports a several year history of progressive right knee pain. He localizes most of the pain along the medial aspect of the knee. He reports some swelling, no locking, and significant giving way of the knee. The pain is aggravated by any weight bearing. The knee pain limits the patient's ability to ambulate long distances. The patient has not appreciated any significant improvement despite Tylenol, topical NSAIDs, intra-articular corticosteroid injections, viscosupplementation, and activity modification. He is unable to tolerate oral NSAIDs due to anticoagulation with Eliquis. He is not using any ambulatory aids. The patient states that the knee pain has progressed to the point that it is significantly interfering with his activities of daily living.  Of note, the patient underwent a right L3-4 discectomy on 01/07/2021 as per Dr. Izora Ribas. He has a follow-up appointment with Dr. Izora Ribas on 02/17/2021.  Medications: Current Outpatient Medications  Medication Sig Dispense Refill   acetaminophen (TYLENOL) 500 MG tablet Take by mouth   albuterol 90 mcg/actuation inhaler INHALE 2 PUFFS BY MOUTH EVERY 6 HOURS AS NEEDED FOR WHEEZING 18 g 3   apixaban (ELIQUIS) 5 mg tablet TAKE ONE TABLET BY MOUTH TWICE DAILY   artificial tears with lanolin (AKWA TEARS) ophthalmic ointment Place 2 drops into both eyes as needed   atorvastatin (LIPITOR) 40 MG tablet Take 40 mg by mouth once daily   cetirizine (ZYRTEC) 10 MG tablet Take 10 mg by mouth 2 (two) times daily as needed   ESOMEPRAZOLE MAGNESIUM (NEXIUM PACKET  ORAL) Take by mouth once daily as needed.   fluticasone propionate (FLONASE) 50 mcg/actuation nasal spray Place 1 spray into both nostrils once daily as needed   FUROsemide (LASIX) 40 MG tablet Take 40 mg by mouth once daily as needed   gabapentin (NEURONTIN) 100 MG capsule TAKE 1 CAPSULE BY MOUTH THREE TIMES DAILY 90 capsule 5   guaiFENesin (MUCINEX) 600 mg SR tablet Take 600 mg by mouth 2 (two) times daily as needed   ipratropium-albuteroL (DUO-NEB) nebulizer solution USE 1 VIAL IN NEBULIZER 4 TIMES DAILY 360 mL 11   levothyroxine (SYNTHROID, LEVOTHROID) 88 MCG tablet Take 88 mcg by mouth once daily   losartan (COZAAR) 25 MG tablet Take 25 mg by mouth once daily 3   metFORMIN (GLUCOPHAGE) 500 MG tablet Take 500 mg by mouth 2 (two) times daily.   methocarbamoL (ROBAXIN) 500 MG tablet Take 500 mg by mouth every 6 (six) hours as needed   montelukast (SINGULAIR) 10 mg tablet Take 1 tablet by mouth nightly 90 tablet 0   ONETOUCH ULTRA BLUE TEST STRIP test strip USE TEST STRIP(S) TO CHECK GLUCOSE TWICE DAILY 12   ONETOUCH ULTRA2 METER Misc USE TO CHECK BLOOD SUGAR UP TO TWICE DAILY AS INSTRUCTED   SYMBICORT 160-4.5 mcg/actuation inhaler Inhale 2 puffs by mouth twice daily 11 g 2   TRADJENTA 5 mg tablet Take 5 mg by mouth once daily   metoprolol tartrate (LOPRESSOR) 50 MG tablet Take by mouth   No current facility-administered medications for this visit.   Allergies: Allergies  Allergen Reactions   Sulfa (Sulfonamide Antibiotics) Shortness Of Breath  Tiotropium Bromide Shortness Of Breath and Swelling   Penicillin G Hives  Joint stiffness   Iodinated Contrast Media Rash  Redness   Iodine Itching and Rash   Other Rash  Stress test dye   Past Medical History: Past Medical History:  Diagnosis Date   (HFpEF) heart failure with preserved ejection fraction (CMS-HCC) 07/25/2015  Last Assessment & Plan: Stable, without worsening. Euvolemic Followed by [facility]   Allergic rhinitis due  to allergen 07/31/2016  Last Assessment & Plan: Likely trigger for current COPD flare. Continue anti-histamine Likely need flonase or nasal steroid in future   Atrial fibrillation (CMS-HCC) 12/10/2014  Last Assessment & Plan: Stable, without worsening or complication. Currently sinus without AFib, occasional PAC On chronic anticoagulation Eliquis Followed by [facility], also Dr Rayann Heman EP - last seen 03/2017, taken off Amio, not indicated for ablation at this time   Blockage of coronary artery of heart (CMS-HCC)   Chickenpox   Collapsed lung   Colon polyp 12-21-2003  adenomatous   COPD (chronic obstructive pulmonary disease) (CMS-HCC)   Coronary artery disease 12/10/2017  Last Assessment & Plan: Stable without angina S/p CABG 2014 Followed by [facility] On med management now Keep all follow up with Cardiologist as requested.   DM (diabetes mellitus) (CMS-HCC) 10/22/2013   H. pylori infection   Heart attack (CMS-HCC)   Herpes zoster 12/10/2017   High cholesterol   Hypothyroidism 05/06/2012  Last Assessment & Plan: Stable, controlled on current dose Levothyroxine 62mcg TSH minimally elevated, improved from prior. Normal Free T4 Refilled   Multiple allergies   OSA on CPAP 02/28/2017  Last Assessment & Plan: Well controlled, chronic OSA on CPAP - Good adherence to CPAP nightly - Continue current CPAP therapy, patient seems to be benefiting from therapy   Osteoarthritis of multiple joints 05/14/2017  Last Assessment & Plan: Gradual worsening R knee pain and limitations, with known multiple joint OA/DJD, mostly knees S/p R knee surgery Dr Marry Guan [facility] 2003 Reviewed conservative therapy again Consider repeat X-ray but deferred since may refer to Ortho soon at request, advised return to Dr Marry Guan for re-eval. 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 He has much more energy than before and is able to do more activities without significant limitations. Continue medical therapy.   Sleep apnea   Thyroid disease   Past Surgical History: Past Surgical History:  Procedure Laterality Date   COLONOSCOPY 12/21/2003  Adenomatous Polyps, FH Colon Polyps (Mother/Sister)   COLONOSCOPY 11/21/2013  Adenomatous Polyps, FH Colon Polyps (Mother/Sister: CBF 10/2018 Recall ltr mailed   CORONARY ARTERY BYPASS W/ARTERIAL GRAFTS 2014   EGD 12/21/2003  H Pylori: No repeat per RTE   KNEE ARTHROSCOPY Right   neck surgery   Right L3-4 microdiscectomy 01/07/2021  Dr Meade Maw [facility]   Social History: Social History   Socioeconomic History   Marital status: Married  Spouse name: Harle Battiest   Number of children: 4   Years of education: 13.5  Occupational History   Occupation: Retired  Tobacco Use   Smoking status: Former Smoker  Packs/day: 1.50  Years: 5.00  Pack years: 7.50  Types: Cigarettes  Quit date: 05/02/1967  Years since quitting: 53.8   Smokeless tobacco: Never Used  Vaping Use   Vaping Use: Never used  Substance and  Sexual Activity   Alcohol use: Not Currently   Drug use: Never   Sexual activity: Yes  Partners: Female   Family History: Family History  Problem Relation Age of Onset   High blood pressure (Hypertension) Mother   Breast cancer Mother   High blood pressure (Hypertension) Father   Prostate cancer Father   Review of Systems: A comprehensive 14 point ROS was performed, reviewed, and the pertinent orthopaedic findings are documented in the HPI.  Exam BP 108/68  Temp 36.1 C (97 F)  Ht 177.8 cm (5\' 10" )  Wt 86.9 kg (191 lb 9.6 oz)  BMI 27.49 kg/m   General:  Well-developed, well-nourished male seen in no acute distress.  Antalgic gait. Varus thrust to the right knee.  HEENT:  Atraumatic, normocephalic. Pupils are equal and reactive to light.  Extraocular motion is intact. Sclera are clear. Oropharynx is clear with moist mucosa.  Neck:  Supple, nontender, and with good ROM. No thyromegaly, adenopathy, JVD, or carotid bruits.  Lungs:  Clear to auscultation bilaterally.  Cardiovascular:  Regular rate and rhythm. Normal S1, S2. No murmur . No appreciable gallops or rubs. Peripheral pulses are palpable. No lower extremity edema. Homan`s test is negative.  Abdomen:  Soft, nontender, nondistended. Bowel sounds are present.  Extremities: Good strength, stability, and range of motion of the upper extremities. Good range of motion of the hips and ankles.  Right Knee: Soft tissue swelling: mild Effusion: none Erythema: none Crepitance: mild Tenderness: medial Alignment: relative varus Mediolateral laxity: medial pseudolaxity Posterior sag: negative Patellar tracking: Good tracking without evidence of subluxation or tilt Atrophy: No significant atrophy.  Quadriceps tone was fair to good. Range of motion: 0/3/129 degrees  Neurologic:  Awake, alert, and oriented.  Sensory function is intact to pinprick and light touch.  Motor strength is judged to be 5/5.  Motor coordination is within normal limits.  No apparent clonus. No tremor.   X-rays: I ordered and interpreted standing AP, lateral, and sunrise radiographs of the right knee that were obtained in the office today. There is narrowing of the medial cartilage space with near bone-on-bone articulation and associated varus alignment. Osteophyte formation is noted. Subchondral sclerosis is noted. Degenerative changes to the patellofemoral articulation are noted. No evidence of fracture or dislocation.   Impression: Degenerative arthrosis of the right knee  Plan:  The findings were discussed in detail with the patient. The patient was given informational material on total knee replacement. Conservative treatment options were reviewed with the patient. We discussed the risks  and benefits of surgical intervention. The usual perioperative course was also discussed in detail. The patient expressed understanding of the risks and benefits of surgical intervention and would like to proceed with plans for right total knee arthroplasty. He will need to be released by Dr. Izora Ribas before we will schedule the knee surgery.  I spent a total of 45 minutes in both face-to-face and non-face-to-face activities for this visit on the date of this encounter.  MEDICAL CLEARANCE: Per anesthesiology. ACTIVITY: As tolerated. WORK STATUS: Not applicable. THERAPY: Preoperative physical therapy evaluation. MEDICATIONS: Requested Prescriptions   No prescriptions requested or ordered in this encounter   FOLLOW-UP: Return for preop History & Physical pending surgery date.  Luzmaria Devaux P. Holley Bouche., M.D.  This note was generated in part with voice recognition software and I apologize for any typographical errors that were not detected and corrected.  Electronically signed by Lamar Benes., MD at 02/13/2021 5:43 PM EDT

## 2021-03-07 ENCOUNTER — Observation Stay: Payer: Medicare Other

## 2021-03-07 ENCOUNTER — Observation Stay
Admission: RE | Admit: 2021-03-07 | Discharge: 2021-03-08 | Disposition: A | Discharge status: Home / Self Care | Payer: Medicare Other | Attending: Orthopedic Surgery | Admitting: Orthopedic Surgery

## 2021-03-07 ENCOUNTER — Ambulatory Visit: Payer: Medicare Other | Admitting: Urgent Care

## 2021-03-07 ENCOUNTER — Encounter
Admission: RE | Disposition: A | Discharge status: Home / Self Care | Payer: Self-pay | Source: Home / Self Care | Attending: Orthopedic Surgery

## 2021-03-07 ENCOUNTER — Other Ambulatory Visit: Payer: Self-pay

## 2021-03-07 ENCOUNTER — Encounter: Payer: Self-pay | Admitting: Orthopedic Surgery

## 2021-03-07 DIAGNOSIS — D126 Benign neoplasm of colon, unspecified: Secondary | ICD-10-CM

## 2021-03-07 DIAGNOSIS — G4733 Obstructive sleep apnea (adult) (pediatric): Secondary | ICD-10-CM | POA: Diagnosis not present

## 2021-03-07 DIAGNOSIS — E119 Type 2 diabetes mellitus without complications: Secondary | ICD-10-CM | POA: Insufficient documentation

## 2021-03-07 DIAGNOSIS — E1142 Type 2 diabetes mellitus with diabetic polyneuropathy: Secondary | ICD-10-CM

## 2021-03-07 DIAGNOSIS — Z96659 Presence of unspecified artificial knee joint: Secondary | ICD-10-CM

## 2021-03-07 DIAGNOSIS — Z7984 Long term (current) use of oral hypoglycemic drugs: Secondary | ICD-10-CM | POA: Insufficient documentation

## 2021-03-07 DIAGNOSIS — Z8679 Personal history of other diseases of the circulatory system: Secondary | ICD-10-CM | POA: Diagnosis not present

## 2021-03-07 DIAGNOSIS — I2510 Atherosclerotic heart disease of native coronary artery without angina pectoris: Secondary | ICD-10-CM | POA: Insufficient documentation

## 2021-03-07 DIAGNOSIS — Z7901 Long term (current) use of anticoagulants: Secondary | ICD-10-CM | POA: Insufficient documentation

## 2021-03-07 DIAGNOSIS — I480 Paroxysmal atrial fibrillation: Secondary | ICD-10-CM

## 2021-03-07 DIAGNOSIS — M1711 Unilateral primary osteoarthritis, right knee: Secondary | ICD-10-CM | POA: Diagnosis not present

## 2021-03-07 DIAGNOSIS — Z79899 Other long term (current) drug therapy: Secondary | ICD-10-CM | POA: Diagnosis not present

## 2021-03-07 DIAGNOSIS — J449 Chronic obstructive pulmonary disease, unspecified: Secondary | ICD-10-CM | POA: Insufficient documentation

## 2021-03-07 DIAGNOSIS — Z87891 Personal history of nicotine dependence: Secondary | ICD-10-CM | POA: Insufficient documentation

## 2021-03-07 DIAGNOSIS — E039 Hypothyroidism, unspecified: Secondary | ICD-10-CM | POA: Insufficient documentation

## 2021-03-07 DIAGNOSIS — I509 Heart failure, unspecified: Secondary | ICD-10-CM | POA: Insufficient documentation

## 2021-03-07 DIAGNOSIS — Z96651 Presence of right artificial knee joint: Secondary | ICD-10-CM | POA: Diagnosis not present

## 2021-03-07 DIAGNOSIS — Z880 Allergy status to penicillin: Secondary | ICD-10-CM

## 2021-03-07 DIAGNOSIS — I5022 Chronic systolic (congestive) heart failure: Secondary | ICD-10-CM

## 2021-03-07 HISTORY — PX: KNEE ARTHROPLASTY: SHX992

## 2021-03-07 LAB — GLUCOSE, CAPILLARY
Glucose-Capillary: 176 mg/dL — ABNORMAL HIGH (ref 70–99)
Glucose-Capillary: 173 mg/dL — ABNORMAL HIGH (ref 70–99)
Glucose-Capillary: 324 mg/dL — ABNORMAL HIGH (ref 70–99)
Glucose-Capillary: 209 mg/dL — ABNORMAL HIGH (ref 70–99)

## 2021-03-07 SURGERY — ARTHROPLASTY, KNEE, TOTAL, USING IMAGELESS COMPUTER-ASSISTED NAVIGATION
Anesthesia: Spinal | Site: Knee | Laterality: Right

## 2021-03-07 MED ORDER — FENTANYL CITRATE (PF) 100 MCG/2ML IJ SOLN: 25.0000 ug | INTRAMUSCULAR | Status: DC | PRN

## 2021-03-07 MED ORDER — APIXABAN 5 MG PO TABS
5.0000 mg | ORAL_TABLET | Freq: Two times a day (BID) | ORAL | Status: DC
  Administered 2021-03-08: 5 mg via ORAL
  Filled 2021-03-07 (×2): qty 1

## 2021-03-07 MED ORDER — CEFAZOLIN SODIUM-DEXTROSE 2-4 GM/100ML-% IV SOLN
INTRAVENOUS | Status: AC
  Filled 2021-03-07: qty 100

## 2021-03-07 MED ORDER — MONTELUKAST SODIUM 10 MG PO TABS
10.0000 mg | ORAL_TABLET | Freq: Every day | ORAL | Status: DC
  Administered 2021-03-07: 10 mg via ORAL
  Filled 2021-03-07 (×2): qty 1

## 2021-03-07 MED ORDER — LOSARTAN POTASSIUM 25 MG PO TABS
12.5000 mg | ORAL_TABLET | ORAL | Status: DC
  Administered 2021-03-08: 12.5 mg via ORAL
  Filled 2021-03-07 (×2): qty 0.5

## 2021-03-07 MED ORDER — HYDROMORPHONE HCL 1 MG/ML IJ SOLN: 0.5000 mg | INTRAMUSCULAR | Status: DC | PRN

## 2021-03-07 MED ORDER — ATORVASTATIN CALCIUM 20 MG PO TABS
40.0000 mg | ORAL_TABLET | Freq: Every day | ORAL | Status: DC
  Administered 2021-03-07: 40 mg via ORAL
  Filled 2021-03-07 (×2): qty 2

## 2021-03-07 MED ORDER — OXYCODONE HCL 5 MG PO TABS: 5.0000 mg | ORAL_TABLET | ORAL | Status: DC | PRN

## 2021-03-07 MED ORDER — LEVOTHYROXINE SODIUM 88 MCG PO TABS
88.0000 ug | ORAL_TABLET | Freq: Every day | ORAL | Status: DC
  Administered 2021-03-08: 88 ug via ORAL
  Filled 2021-03-07 (×2): qty 1

## 2021-03-07 MED ORDER — PRONTOSAN WOUND IRRIGATION OPTIME
TOPICAL | Status: DC | PRN
  Administered 2021-03-07: 1 via TOPICAL

## 2021-03-07 MED ORDER — SODIUM CHLORIDE 0.9 % IV SOLN: INTRAVENOUS | Status: DC

## 2021-03-07 MED ORDER — GABAPENTIN 300 MG PO CAPS: 300.0000 mg | ORAL_CAPSULE | Freq: Once | ORAL | Status: DC

## 2021-03-07 MED ORDER — FAMOTIDINE 20 MG PO TABS: 20.0000 mg | ORAL_TABLET | Freq: Once | ORAL | Status: AC

## 2021-03-07 MED ORDER — IPRATROPIUM-ALBUTEROL 0.5-2.5 (3) MG/3ML IN SOLN: 3.0000 mL | Freq: Four times a day (QID) | RESPIRATORY_TRACT | Status: DC | PRN

## 2021-03-07 MED ORDER — CEFAZOLIN SODIUM-DEXTROSE 2-4 GM/100ML-% IV SOLN: 2.0000 g | INTRAVENOUS | Status: DC

## 2021-03-07 MED ORDER — SODIUM CHLORIDE FLUSH 0.9 % IV SOLN
INTRAVENOUS | Status: AC
  Filled 2021-03-07: qty 40

## 2021-03-07 MED ORDER — FERROUS SULFATE 325 (65 FE) MG PO TABS
325.0000 mg | ORAL_TABLET | Freq: Two times a day (BID) | ORAL | Status: DC
  Administered 2021-03-07 – 2021-03-08 (×2): 325 mg via ORAL
  Filled 2021-03-07 (×3): qty 1

## 2021-03-07 MED ORDER — TRANEXAMIC ACID-NACL 1000-0.7 MG/100ML-% IV SOLN
INTRAVENOUS | Status: DC | PRN
Start: 2021-03-07 — End: 2021-03-07
  Administered 2021-03-07: 1000 mg via INTRAVENOUS

## 2021-03-07 MED ORDER — CELECOXIB 200 MG PO CAPS
ORAL_CAPSULE | ORAL | Status: AC
  Filled 2021-03-07: qty 2

## 2021-03-07 MED ORDER — MOMETASONE FURO-FORMOTEROL FUM 200-5 MCG/ACT IN AERO
2.0000 | INHALATION_SPRAY | Freq: Two times a day (BID) | RESPIRATORY_TRACT | Status: DC
  Administered 2021-03-07 – 2021-03-08 (×2): 2 via RESPIRATORY_TRACT
  Filled 2021-03-07 (×2): qty 8.8

## 2021-03-07 MED ORDER — MAGNESIUM HYDROXIDE 400 MG/5ML PO SUSP
ORAL | Status: AC
  Filled 2021-03-07: qty 30

## 2021-03-07 MED ORDER — CHLORHEXIDINE GLUCONATE 4 % EX LIQD
60.0000 mL | Freq: Once | CUTANEOUS | Status: DC
  Administered 2021-03-07: 4 via TOPICAL

## 2021-03-07 MED ORDER — CELECOXIB 200 MG PO CAPS
400.0000 mg | ORAL_CAPSULE | Freq: Once | ORAL | Status: DC
  Administered 2021-03-07: 400 mg via ORAL

## 2021-03-07 MED ORDER — TRANEXAMIC ACID-NACL 1000-0.7 MG/100ML-% IV SOLN
INTRAVENOUS | Status: AC
  Filled 2021-03-07: qty 100

## 2021-03-07 MED ORDER — DEXAMETHASONE SODIUM PHOSPHATE 10 MG/ML IJ SOLN
INTRAMUSCULAR | Status: AC
  Administered 2021-03-07: 8 mg via INTRAVENOUS
  Filled 2021-03-07: qty 1

## 2021-03-07 MED ORDER — PROPOFOL 10 MG/ML IV BOLUS
INTRAVENOUS | Status: AC
  Filled 2021-03-07: qty 20

## 2021-03-07 MED ORDER — ACETAMINOPHEN 10 MG/ML IV SOLN
1000.0000 mg | Freq: Four times a day (QID) | INTRAVENOUS | Status: DC
  Administered 2021-03-07 – 2021-03-08 (×2): 1000 mg via INTRAVENOUS
  Filled 2021-03-07 (×2): qty 100

## 2021-03-07 MED ORDER — BUPIVACAINE HCL (PF) 0.25 % IJ SOLN
INTRAMUSCULAR | Status: AC
  Filled 2021-03-07: qty 60

## 2021-03-07 MED ORDER — FLUTICASONE PROPIONATE 50 MCG/ACT NA SUSP
1.0000 | Freq: Every day | NASAL | Status: DC | PRN
  Filled 2021-03-07: qty 16

## 2021-03-07 MED ORDER — METOPROLOL TARTRATE 50 MG PO TABS: 75.0000 mg | ORAL_TABLET | Freq: Two times a day (BID) | ORAL | Status: DC

## 2021-03-07 MED ORDER — METOCLOPRAMIDE HCL 10 MG PO TABS: 10.0000 mg | ORAL_TABLET | Freq: Three times a day (TID) | ORAL | Status: DC

## 2021-03-07 MED ORDER — INSULIN ASPART 100 UNIT/ML IJ SOLN
INTRAMUSCULAR | Status: AC
  Administered 2021-03-07: 11 [IU] via SUBCUTANEOUS
  Filled 2021-03-07: qty 1

## 2021-03-07 MED ORDER — CELECOXIB 200 MG PO CAPS
200.0000 mg | ORAL_CAPSULE | Freq: Two times a day (BID) | ORAL | Status: DC
  Administered 2021-03-07: 200 mg via ORAL

## 2021-03-07 MED ORDER — INSULIN ASPART 100 UNIT/ML IJ SOLN
0.0000 [IU] | Freq: Three times a day (TID) | INTRAMUSCULAR | Status: DC
  Administered 2021-03-08: 3 [IU] via SUBCUTANEOUS

## 2021-03-07 MED ORDER — ONDANSETRON HCL 4 MG/2ML IJ SOLN: 4.0000 mg | Freq: Once | INTRAMUSCULAR | Status: DC | PRN

## 2021-03-07 MED ORDER — SODIUM CHLORIDE 0.9 % IV SOLN
INTRAVENOUS | Status: DC | PRN
  Administered 2021-03-07: 60 mL

## 2021-03-07 MED ORDER — ACETAMINOPHEN 10 MG/ML IV SOLN
INTRAVENOUS | Status: AC
  Filled 2021-03-07: qty 100

## 2021-03-07 MED ORDER — ONDANSETRON HCL 4 MG/2ML IJ SOLN
INTRAMUSCULAR | Status: DC | PRN
  Administered 2021-03-07: 4 mg via INTRAVENOUS

## 2021-03-07 MED ORDER — FUROSEMIDE 20 MG PO TABS
20.0000 mg | ORAL_TABLET | Freq: Every day | ORAL | Status: DC | PRN
  Filled 2021-03-07: qty 1

## 2021-03-07 MED ORDER — PHENYLEPHRINE HCL (PRESSORS) 10 MG/ML IV SOLN
INTRAVENOUS | Status: DC | PRN
  Administered 2021-03-07: 100 ug via INTRAVENOUS

## 2021-03-07 MED ORDER — ACETAMINOPHEN 10 MG/ML IV SOLN: 1000.0000 mg | Freq: Four times a day (QID) | INTRAVENOUS | Status: DC

## 2021-03-07 MED ORDER — GABAPENTIN 300 MG PO CAPS
ORAL_CAPSULE | ORAL | Status: AC
  Administered 2021-03-07: 300 mg via ORAL
  Filled 2021-03-07: qty 1

## 2021-03-07 MED ORDER — TRANEXAMIC ACID-NACL 1000-0.7 MG/100ML-% IV SOLN: 1000.0000 mg | INTRAVENOUS | Status: DC

## 2021-03-07 MED ORDER — CEFAZOLIN SODIUM-DEXTROSE 2-4 GM/100ML-% IV SOLN
INTRAVENOUS | Status: AC
  Administered 2021-03-07: 2 g via INTRAVENOUS
  Filled 2021-03-07: qty 100

## 2021-03-07 MED ORDER — METFORMIN HCL 500 MG PO TABS
1000.0000 mg | ORAL_TABLET | Freq: Two times a day (BID) | ORAL | Status: DC
  Administered 2021-03-08: 1000 mg via ORAL
  Filled 2021-03-07 (×3): qty 2

## 2021-03-07 MED ORDER — MAGNESIUM HYDROXIDE 400 MG/5ML PO SUSP: 30.0000 mL | Freq: Every day | ORAL | Status: DC

## 2021-03-07 MED ORDER — SPIRONOLACTONE 25 MG PO TABS
12.5000 mg | ORAL_TABLET | Freq: Every day | ORAL | Status: DC
  Administered 2021-03-08: 12.5 mg via ORAL
  Filled 2021-03-07: qty 0.5

## 2021-03-07 MED ORDER — CHLORHEXIDINE GLUCONATE 0.12 % MT SOLN
OROMUCOSAL | Status: AC
  Administered 2021-03-07: 15 mL via OROMUCOSAL
  Filled 2021-03-07: qty 15

## 2021-03-07 MED ORDER — NEOMYCIN-POLYMYXIN B GU 40-200000 IR SOLN
Status: DC | PRN
  Administered 2021-03-07: 14 mL

## 2021-03-07 MED ORDER — DAPAGLIFLOZIN PROPANEDIOL 5 MG PO TABS
10.0000 mg | ORAL_TABLET | Freq: Every day | ORAL | Status: DC
  Administered 2021-03-08: 10 mg via ORAL
  Filled 2021-03-07 (×2): qty 2

## 2021-03-07 MED ORDER — ACETAMINOPHEN 325 MG PO TABS: 325.0000 mg | ORAL_TABLET | Freq: Four times a day (QID) | ORAL | Status: DC | PRN

## 2021-03-07 MED ORDER — SODIUM CHLORIDE 0.9 % IV SOLN: INTRAVENOUS | Status: DC | PRN

## 2021-03-07 MED ORDER — TRAMADOL HCL 50 MG PO TABS
50.0000 mg | ORAL_TABLET | ORAL | Status: DC | PRN
  Administered 2021-03-08: 50 mg via ORAL

## 2021-03-07 MED ORDER — CEFAZOLIN SODIUM-DEXTROSE 2-3 GM-%(50ML) IV SOLR
INTRAVENOUS | Status: DC | PRN
  Administered 2021-03-07: 2 g via INTRAVENOUS

## 2021-03-07 MED ORDER — SENNOSIDES-DOCUSATE SODIUM 8.6-50 MG PO TABS
1.0000 | ORAL_TABLET | Freq: Two times a day (BID) | ORAL | Status: DC
  Administered 2021-03-08: 1 via ORAL
  Filled 2021-03-07 (×3): qty 1

## 2021-03-07 MED ORDER — ALUM & MAG HYDROXIDE-SIMETH 200-200-20 MG/5ML PO SUSP: 30.0000 mL | ORAL | Status: DC | PRN

## 2021-03-07 MED ORDER — BUPIVACAINE HCL (PF) 0.5 % IJ SOLN
INTRAMUSCULAR | Status: DC | PRN
Start: 2021-03-07 — End: 2021-03-07
  Administered 2021-03-07: 3 mL via INTRATHECAL

## 2021-03-07 MED ORDER — SODIUM CHLORIDE 0.9 % IR SOLN
Status: DC | PRN
  Administered 2021-03-07: 3000 mL

## 2021-03-07 MED ORDER — FAMOTIDINE 20 MG PO TABS
ORAL_TABLET | ORAL | Status: AC
  Administered 2021-03-07: 20 mg via ORAL
  Filled 2021-03-07: qty 1

## 2021-03-07 MED ORDER — TRANEXAMIC ACID-NACL 1000-0.7 MG/100ML-% IV SOLN: 1000.0000 mg | Freq: Once | INTRAVENOUS | Status: AC

## 2021-03-07 MED ORDER — GABAPENTIN 100 MG PO CAPS: 100.0000 mg | ORAL_CAPSULE | Freq: Three times a day (TID) | ORAL | Status: DC

## 2021-03-07 MED ORDER — CALCIUM CARBONATE ANTACID 500 MG PO CHEW
1000.0000 mg | CHEWABLE_TABLET | Freq: Every day | ORAL | Status: DC | PRN
  Filled 2021-03-07: qty 2

## 2021-03-07 MED ORDER — PHENYLEPHRINE HCL-NACL 20-0.9 MG/250ML-% IV SOLN
INTRAVENOUS | Status: DC | PRN
  Administered 2021-03-07: 25 ug/min via INTRAVENOUS

## 2021-03-07 MED ORDER — METOCLOPRAMIDE HCL 10 MG PO TABS
ORAL_TABLET | ORAL | Status: AC
  Administered 2021-03-07: 10 mg via ORAL
  Filled 2021-03-07: qty 1

## 2021-03-07 MED ORDER — FENTANYL CITRATE (PF) 100 MCG/2ML IJ SOLN
INTRAMUSCULAR | Status: DC | PRN
  Administered 2021-03-07: 50 ug via INTRAVENOUS

## 2021-03-07 MED ORDER — OXYCODONE HCL 5 MG PO TABS
ORAL_TABLET | ORAL | Status: AC
  Administered 2021-03-07: 5 mg via ORAL
  Filled 2021-03-07: qty 1

## 2021-03-07 MED ORDER — PHENOL 1.4 % MT LIQD
1.0000 | OROMUCOSAL | Status: DC | PRN
  Filled 2021-03-07: qty 177

## 2021-03-07 MED ORDER — GABAPENTIN 100 MG PO CAPS
ORAL_CAPSULE | ORAL | Status: AC
  Administered 2021-03-07: 100 mg via ORAL
  Filled 2021-03-07: qty 1

## 2021-03-07 MED ORDER — BUPIVACAINE HCL (PF) 0.25 % IJ SOLN
INTRAMUSCULAR | Status: DC | PRN
  Administered 2021-03-07: 30 mL

## 2021-03-07 MED ORDER — LORATADINE 10 MG PO TABS
10.0000 mg | ORAL_TABLET | Freq: Every day | ORAL | Status: DC
  Administered 2021-03-08: 10 mg via ORAL
  Filled 2021-03-07: qty 1

## 2021-03-07 MED ORDER — CHLORHEXIDINE GLUCONATE 0.12 % MT SOLN: 15.0000 mL | Freq: Once | OROMUCOSAL | Status: DC

## 2021-03-07 MED ORDER — OXYCODONE HCL 5 MG PO TABS: 10.0000 mg | ORAL_TABLET | ORAL | Status: DC | PRN

## 2021-03-07 MED ORDER — TRANEXAMIC ACID-NACL 1000-0.7 MG/100ML-% IV SOLN
INTRAVENOUS | Status: AC
  Administered 2021-03-07: 1000 mg via INTRAVENOUS
  Filled 2021-03-07: qty 100

## 2021-03-07 MED ORDER — ONDANSETRON HCL 4 MG PO TABS: 4.0000 mg | ORAL_TABLET | Freq: Four times a day (QID) | ORAL | Status: DC | PRN

## 2021-03-07 MED ORDER — ALBUTEROL SULFATE (2.5 MG/3ML) 0.083% IN NEBU: 2.5000 mg | INHALATION_SOLUTION | Freq: Four times a day (QID) | RESPIRATORY_TRACT | Status: DC | PRN

## 2021-03-07 MED ORDER — DEXAMETHASONE SODIUM PHOSPHATE 10 MG/ML IJ SOLN: 8.0000 mg | Freq: Once | INTRAMUSCULAR | Status: DC

## 2021-03-07 MED ORDER — FLEET ENEMA 7-19 GM/118ML RE ENEM: 1.0000 | ENEMA | Freq: Once | RECTAL | Status: DC | PRN

## 2021-03-07 MED ORDER — MENTHOL 3 MG MT LOZG
1.0000 | LOZENGE | OROMUCOSAL | Status: DC | PRN
  Filled 2021-03-07: qty 9

## 2021-03-07 MED ORDER — BISACODYL 10 MG RE SUPP
10.0000 mg | Freq: Every day | RECTAL | Status: DC | PRN
  Filled 2021-03-07: qty 1

## 2021-03-07 MED ORDER — INSULIN ASPART 100 UNIT/ML IJ SOLN: 0.0000 [IU] | Freq: Every day | INTRAMUSCULAR | Status: DC

## 2021-03-07 MED ORDER — CEFAZOLIN SODIUM-DEXTROSE 2-4 GM/100ML-% IV SOLN
2.0000 g | Freq: Four times a day (QID) | INTRAVENOUS | Status: AC
  Administered 2021-03-07: 2 g via INTRAVENOUS

## 2021-03-07 MED ORDER — ACETAMINOPHEN 10 MG/ML IV SOLN
INTRAVENOUS | Status: AC
  Administered 2021-03-07: 1000 mg via INTRAVENOUS
  Filled 2021-03-07: qty 100

## 2021-03-07 MED ORDER — BUPIVACAINE LIPOSOME 1.3 % IJ SUSP
INTRAMUSCULAR | Status: AC
  Filled 2021-03-07: qty 20

## 2021-03-07 MED ORDER — PROPOFOL 10 MG/ML IV BOLUS
INTRAVENOUS | Status: DC | PRN
  Administered 2021-03-07: 20 mg via INTRAVENOUS
  Administered 2021-03-07: 30 mg via INTRAVENOUS

## 2021-03-07 MED ORDER — EPHEDRINE SULFATE 50 MG/ML IJ SOLN
INTRAMUSCULAR | Status: DC | PRN
  Administered 2021-03-07: 7.5 mg via INTRAVENOUS
  Administered 2021-03-07: 5 mg via INTRAVENOUS

## 2021-03-07 MED ORDER — DIPHENHYDRAMINE HCL 12.5 MG/5ML PO ELIX
12.5000 mg | ORAL_SOLUTION | ORAL | Status: DC | PRN
  Filled 2021-03-07: qty 10

## 2021-03-07 MED ORDER — ORAL CARE MOUTH RINSE: 15.0000 mL | Freq: Once | OROMUCOSAL | Status: DC

## 2021-03-07 MED ORDER — FENTANYL CITRATE (PF) 100 MCG/2ML IJ SOLN
INTRAMUSCULAR | Status: AC
  Filled 2021-03-07: qty 2

## 2021-03-07 MED ORDER — ONDANSETRON HCL 4 MG/2ML IJ SOLN: 4.0000 mg | Freq: Four times a day (QID) | INTRAMUSCULAR | Status: DC | PRN

## 2021-03-07 MED ORDER — PROPOFOL 500 MG/50ML IV EMUL
INTRAVENOUS | Status: DC | PRN
  Administered 2021-03-07: 50 ug/kg/min via INTRAVENOUS

## 2021-03-07 MED ORDER — PANTOPRAZOLE SODIUM 40 MG PO TBEC
40.0000 mg | DELAYED_RELEASE_TABLET | Freq: Two times a day (BID) | ORAL | Status: DC
  Administered 2021-03-07 – 2021-03-08 (×2): 40 mg via ORAL
  Filled 2021-03-07 (×3): qty 1

## 2021-03-07 MED ORDER — 0.9 % SODIUM CHLORIDE (POUR BTL) OPTIME
TOPICAL | Status: DC | PRN
  Administered 2021-03-07: 500 mL

## 2021-03-07 MED ORDER — ACETAMINOPHEN 10 MG/ML IV SOLN
INTRAVENOUS | Status: DC | PRN
  Administered 2021-03-07: 1000 mg via INTRAVENOUS

## 2021-03-07 MED ORDER — MIDAZOLAM HCL 2 MG/2ML IJ SOLN
INTRAMUSCULAR | Status: AC
  Filled 2021-03-07: qty 2

## 2021-03-07 SURGICAL SUPPLY — 74 items
ATTUNE MED DOME PAT 41 KNEE (Knees) ×1 IMPLANT
ATTUNE PS FEM RT SZ 7 CEM KNEE (Femur) ×1 IMPLANT
ATTUNE PSRP INSR SZ7 5 KNEE (Insert) ×1 IMPLANT
BASE TIBIAL ROT PLAT SZ 7 KNEE (Knees) IMPLANT
BATTERY INSTRU NAVIGATION (MISCELLANEOUS) ×8 IMPLANT
BLADE SAW 70X12.5 (BLADE) ×2 IMPLANT
BLADE SAW 90X13X1.19 OSCILLAT (BLADE) ×2 IMPLANT
BLADE SAW 90X25X1.19 OSCILLAT (BLADE) ×2 IMPLANT
BONE CEMENT GENTAMICIN (Cement) ×4 IMPLANT
CEMENT BONE GENTAMICIN 40 (Cement) IMPLANT
COOLER POLAR GLACIER W/PUMP (MISCELLANEOUS) ×2 IMPLANT
DRAPE 3/4 80X56 (DRAPES) ×2 IMPLANT
DRAPE INCISE IOBAN 66X45 STRL (DRAPES) ×2 IMPLANT
DRSG DERMACEA 8X12 NADH (GAUZE/BANDAGES/DRESSINGS) ×2 IMPLANT
DRSG MEPILEX SACRM 8.7X9.8 (GAUZE/BANDAGES/DRESSINGS) ×2 IMPLANT
DRSG OPSITE POSTOP 4X12 (GAUZE/BANDAGES/DRESSINGS) ×1 IMPLANT
DRSG OPSITE POSTOP 4X14 (GAUZE/BANDAGES/DRESSINGS) ×2 IMPLANT
DRSG TEGADERM 4X4.75 (GAUZE/BANDAGES/DRESSINGS) ×2 IMPLANT
DURAPREP 26ML APPLICATOR (WOUND CARE) ×4 IMPLANT
ELECT CAUTERY BLADE 6.4 (BLADE) ×2 IMPLANT
ELECT REM PT RETURN 9FT ADLT (ELECTROSURGICAL) ×2
ELECTRODE REM PT RTRN 9FT ADLT (ELECTROSURGICAL) ×1 IMPLANT
EX-PIN ORTHOLOCK NAV 4X150 (PIN) ×4 IMPLANT
GLOVE SRG 8 PF TXTR STRL LF DI (GLOVE) ×1 IMPLANT
GLOVE SURG ENC TEXT LTX SZ7.5 (GLOVE) ×4 IMPLANT
GLOVE SURG UNDER POLY LF SZ7.5 (GLOVE) ×2 IMPLANT
GLOVE SURG UNDER POLY LF SZ8 (GLOVE) ×1
GOWN STRL REUS W/ TWL LRG LVL3 (GOWN DISPOSABLE) ×2 IMPLANT
GOWN STRL REUS W/ TWL XL LVL3 (GOWN DISPOSABLE) ×1 IMPLANT
GOWN STRL REUS W/TWL LRG LVL3 (GOWN DISPOSABLE) ×2
GOWN STRL REUS W/TWL XL LVL3 (GOWN DISPOSABLE) ×1
HEMOVAC 400CC 10FR (MISCELLANEOUS) ×2 IMPLANT
HOLDER FOLEY CATH W/STRAP (MISCELLANEOUS) ×2 IMPLANT
IRRIGATION SURGIPHOR STRL (IV SOLUTION) ×2 IMPLANT
IV NS IRRIG 3000ML ARTHROMATIC (IV SOLUTION) ×2 IMPLANT
KIT TURNOVER KIT A (KITS) ×2 IMPLANT
KNIFE SCULPS 14X20 (INSTRUMENTS) ×2 IMPLANT
LABEL OR SOLS (LABEL) ×2 IMPLANT
MANIFOLD NEPTUNE II (INSTRUMENTS) ×4 IMPLANT
NDL SAFETY ECLIPSE 18X1.5 (NEEDLE) ×1 IMPLANT
NDL SPNL 20GX3.5 QUINCKE YW (NEEDLE) ×2 IMPLANT
NEEDLE HYPO 18GX1.5 SHARP (NEEDLE) ×1
NEEDLE SPNL 20GX3.5 QUINCKE YW (NEEDLE) ×4 IMPLANT
NS IRRIG 500ML POUR BTL (IV SOLUTION) ×2 IMPLANT
PACK TOTAL KNEE (MISCELLANEOUS) ×2 IMPLANT
PAD ABD DERMACEA PRESS 5X9 (GAUZE/BANDAGES/DRESSINGS) ×4 IMPLANT
PAD WRAPON POLAR KNEE (MISCELLANEOUS) ×1 IMPLANT
PENCIL SMOKE EVACUATOR COATED (MISCELLANEOUS) ×2 IMPLANT
PIN DRILL FIX HALF THREAD (BIT) ×4 IMPLANT
PIN DRILL QUICK PACK ×3 IMPLANT
PIN FIXATION 1/8DIA X 3INL (PIN) ×2 IMPLANT
PULSAVAC PLUS IRRIG FAN TIP (DISPOSABLE) ×2
SOL PREP PVP 2OZ (MISCELLANEOUS) ×2
SOLUTION PREP PVP 2OZ (MISCELLANEOUS) ×1 IMPLANT
SPONGE DRAIN TRACH 4X4 STRL 2S (GAUZE/BANDAGES/DRESSINGS) ×2 IMPLANT
SPONGE T-LAP 18X18 ~~LOC~~+RFID (SPONGE) ×6 IMPLANT
STAPLER SKIN PROX 35W (STAPLE) ×2 IMPLANT
STOCKINETTE IMPERV 14X48 (MISCELLANEOUS) ×1 IMPLANT
STRAP TIBIA SHORT (MISCELLANEOUS) ×2 IMPLANT
SUCTION FRAZIER HANDLE 10FR (MISCELLANEOUS) ×1
SUCTION TUBE FRAZIER 10FR DISP (MISCELLANEOUS) ×1 IMPLANT
SUT VIC AB 0 CT1 36 (SUTURE) ×4 IMPLANT
SUT VIC AB 1 CT1 36 (SUTURE) ×4 IMPLANT
SUT VIC AB 2-0 CT2 27 (SUTURE) ×2 IMPLANT
SYR 20ML LL LF (SYRINGE) ×2 IMPLANT
SYR 30ML LL (SYRINGE) ×4 IMPLANT
TIBIAL BASE ROT PLAT SZ 7 KNEE (Knees) ×2 IMPLANT
TIP FAN IRRIG PULSAVAC PLUS (DISPOSABLE) ×1 IMPLANT
TOWEL OR 17X26 4PK STRL BLUE (TOWEL DISPOSABLE) ×2 IMPLANT
TOWER CARTRIDGE SMART MIX (DISPOSABLE) ×2 IMPLANT
TRAY FOLEY MTR SLVR 16FR STAT (SET/KITS/TRAYS/PACK) ×2 IMPLANT
WATER STERILE IRR 1000ML POUR (IV SOLUTION) ×2 IMPLANT
WATER STERILE IRR 500ML POUR (IV SOLUTION) ×2 IMPLANT
WRAPON POLAR PAD KNEE (MISCELLANEOUS) ×2

## 2021-03-07 NOTE — Progress Notes (Addendum)
PT Cancellation Note  Patient Details Name: Philip ANTRIM Sr. MRN: [id number] DOB: 06-08-1945   Cancelled Treatment:    Reason Eval/Treat Not Completed: Medical issues which prohibited therapy: Pt presented with only trace RLE ankle strength and AROM.  Will attempt to see pt at a future date/time as medically appropriate.     Linus Salmons PT, DPT 03/07/21, 4:59 PM

## 2021-03-07 NOTE — Anesthesia Preprocedure Evaluation (Signed)
Anesthesia Evaluation  Patient identified by MRN, date of birth, ID band Patient awake    Reviewed: Allergy & Precautions, NPO status , Patient's Chart, lab work & pertinent test results  History of Anesthesia Complications Negative for: history of anesthetic complications  Airway Mallampati: II  TM Distance: >3 FB Neck ROM: Full    Dental  (+) Edentulous Lower, Edentulous Upper   Pulmonary sleep apnea , COPD,  COPD inhaler, former smoker,    breath sounds clear to auscultation- rhonchi (-) wheezing      Cardiovascular hypertension, Pt. on medications + CAD, + CABG (2014) and +CHF (preserved EF)  + dysrhythmias Atrial Fibrillation  Rhythm:Regular Rate:Normal - Systolic murmurs and - Diastolic murmurs    Neuro/Psych neg Seizures negative neurological ROS  negative psych ROS   GI/Hepatic negative GI ROS, Neg liver ROS,   Endo/Other  diabetes, Oral Hypoglycemic AgentsHypothyroidism   Renal/GU negative Renal ROS     Musculoskeletal  (+) Arthritis ,   Abdominal (+) - obese,   Peds  Hematology negative hematology ROS (+)   Anesthesia Other Findings Past Medical History: No date: (HFpEF) heart failure with preserved ejection fraction ([place])     Comment:  a. 06/2015 Echo: EF 50-55%, Gr1 DD. Mild MR. Mildly dil               LA. Nl RV fxn. Nl PASP. No date: Arthritis     Comment:  a. right knee No date: Asthma No date: Atrial tachycardia ([place]) No date: Chronic anticoagulation     Comment:  Apixaban No date: Collapse of right lung     Comment:  a. 12/1967 - ? etiology. No date: COPD (chronic obstructive pulmonary disease) (HCC) No date: Coronary artery disease     Comment:  a. 05/2012 Cath: severe 3VD-->CABG x 3 by Dr. Servando Snare in              01/07/2014[place] Jun, SVG->LCX & SVG->RPDA; b. 06/2014               MV: no ischemia/infarct. No date: DDD (degenerative disc disease), lumbar No date: Dyspnea No date: Edema      Comment:  LEGS/ FEET No date: Essential hypertension No date: History of Helicobacter pylori infection No date: HNP (herniated nucleus pulposus), lumbar No date: HOH (hard of hearing) No date: Hypercholesteremia No date: Hypothyroidism No date: Low back pain radiating to lower extremity No date: Lumbar radiculitis No date: Lumbar stenosis with neurogenic claudication No date: Neuropathy No date: PAF (paroxysmal atrial fibrillation) (HCC)     Comment:  a. CHA2DS2VASc = 5 (age, CHF, HTN, vascular disase,               T2DM) --> Eliquis. 2021: Sepsis ([place]) No date: Shingles No date: Sleep apnea     Comment:  NO CPAP No date: Type II diabetes mellitus (HCC)   Reproductive/Obstetrics                             Lab Results  Component Value Date   WBC 7.1 03/02/2021   HGB 15.7 03/02/2021   HCT 47.4 03/02/2021   MCV 92.4 03/02/2021   PLT 200 03/02/2021    Anesthesia Physical Anesthesia Plan  ASA: 3  Anesthesia Plan: Spinal   Post-op Pain Management:    Induction:   PONV Risk Score and Plan: 1 and Propofol infusion  Airway Management Planned: Natural Airway  Additional Equipment:   Intra-op Plan:  Post-operative Plan:   Informed Consent: I have reviewed the patients History and Physical, chart, labs and discussed the procedure including the risks, benefits and alternatives for the proposed anesthesia with the patient or authorized representative who has indicated his/her understanding and acceptance.     Dental advisory given  Plan Discussed with: CRNA and Anesthesiologist  Anesthesia Plan Comments:         Anesthesia Quick Evaluation

## 2021-03-07 NOTE — Plan of Care (Signed)
Continue with current plan of care.

## 2021-03-07 NOTE — Op Note (Signed)
OPERATIVE NOTE  DATE OF SURGERY:  03/07/2021  PATIENT NAME:  Philip WARNELL Sr.   DOB: Feb 13, 1946  MRN: [id number]  PRE-OPERATIVE DIAGNOSIS: Degenerative arthrosis of the right knee, primary  POST-OPERATIVE DIAGNOSIS:  Same  PROCEDURE:  Right total knee arthroplasty using computer-assisted navigation  SURGEON:  Marciano Sequin. M.D.  ASSISTANT: Cassell Smiles, [name] (present and scrubbed throughout the case, critical for assistance with exposure, retraction, instrumentation, and closure)  ANESTHESIA: spinal  ESTIMATED BLOOD LOSS: 50 mL  FLUIDS REPLACED: 1200 mL of crystalloid  TOURNIQUET TIME: 80 minutes  DRAINS: 2 medium Hemovac drains  SOFT TISSUE RELEASES: Anterior cruciate ligament, posterior cruciate ligament, deep  medial collateral ligament, patellofemoral ligament  IMPLANTS UTILIZED: DePuy Attune size 7 posterior stabilized femoral component (cemented), size 7 rotating platform tibial component (cemented), 41 mm medialized dome patella (cemented), and a 5 mm stabilized rotating platform polyethylene insert.  INDICATIONS FOR SURGERY: Philip Wilbon. is a 75 y.o. year old adult with a long history of progressive knee pain. X-rays demonstrated severe degenerative changes in tricompartmental fashion. The patient had not seen any significant improvement despite conservative nonsurgical intervention. After discussion of the risks and benefits of surgical intervention, the patient expressed understanding of the risks benefits and agree with plans for total knee arthroplasty.   The risks, benefits, and alternatives were discussed at length including but not limited to the risks of infection, bleeding, nerve injury, stiffness, blood clots, the need for revision surgery, cardiopulmonary complications, among others, and they were willing to proceed.  PROCEDURE IN DETAIL: The patient was brought into the operating room and, after adequate spinal anesthesia was achieved, a tourniquet was  placed on the patient's upper thigh. The patient's knee and leg were cleaned and prepped with alcohol and DuraPrep and draped in the usual sterile fashion. A "timeout" was performed as per usual protocol. The lower extremity was exsanguinated using an Esmarch, and the tourniquet was inflated to 300 mmHg. An anterior longitudinal incision was made followed by a standard mid vastus approach. The deep fibers of the medial collateral ligament were elevated in a subperiosteal fashion off of the medial flare of the tibia so as to maintain a continuous soft tissue sleeve. The patella was subluxed laterally and the patellofemoral ligament was incised. Inspection of the knee demonstrated severe degenerative changes with full-thickness loss of articular cartilage. Osteophytes were debrided using a rongeur. Anterior and posterior cruciate ligaments were excised. Two 4.0 mm Schanz pins were inserted in the femur and into the tibia for attachment of the array of trackers used for computer-assisted navigation. Hip center was identified using a circumduction technique. Distal landmarks were mapped using the computer. The distal femur and proximal tibia were mapped using the computer. The distal femoral cutting guide was positioned using computer-assisted navigation so as to achieve a 5 distal valgus cut. The femur was sized and it was felt that a size 7 femoral component was appropriate. A size 7 femoral cutting guide was positioned and the anterior cut was performed and verified using the computer. This was followed by completion of the posterior and chamfer cuts. Femoral cutting guide for the central box was then positioned in the center box cut was performed.  Attention was then directed to the proximal tibia. Medial and lateral menisci were excised. The extramedullary tibial cutting guide was positioned using computer-assisted navigation so as to achieve a 0 varus-valgus alignment and 3 posterior slope. The cut was  performed and verified using the computer. The  proximal tibia was sized and it was felt that a size 7 tibial tray was appropriate. Tibial and femoral trials were inserted followed by insertion of a 5 mm polyethylene insert. This allowed for excellent mediolateral soft tissue balancing both in flexion and in full extension. Finally, the patella was cut and prepared so as to accommodate a 41 mm medialized dome patella. A patella trial was placed and the knee was placed through a range of motion with excellent patellar tracking appreciated. The femoral trial was removed after debridement of posterior osteophytes. The central post-hole for the tibial component was reamed followed by insertion of a keel punch. Tibial trials were then removed. Cut surfaces of bone were irrigated with copious amounts of normal saline using pulsatile lavage and then suctioned dry. Polymethylmethacrylate cement with gentamicin was prepared in the usual fashion using a vacuum mixer. Cement was applied to the cut surface of the proximal tibia as well as along the undersurface of a size 7 rotating platform tibial component. Tibial component was positioned and impacted into place. Excess cement was removed using [employer] fast streamer. Cement was then applied to the cut surfaces of the femur as well as along the posterior flanges of the size 7 femoral component. The femoral component was positioned and impacted into place. Excess cement was removed using [employer] fast streamer. A 5 mm polyethylene trial was inserted and the knee was brought into full extension with steady axial compression applied. Finally, cement was applied to the backside of a 41 mm medialized dome patella and the patellar component was positioned and patellar clamp applied. Excess cement was removed using [employer] fast streamer. After adequate curing of the cement, the tourniquet was deflated after a total tourniquet time of 80 minutes. Hemostasis was achieved using electrocautery. The knee was  irrigated with copious amounts of normal saline using pulsatile lavage followed by 350 mL of Prontosan and then suctioned dry. 20 mL of 1.3% Exparel and 60 mL of 0.25% Marcaine in 40 mL of normal saline was injected along the posterior capsule, medial and lateral gutters, and along the arthrotomy site. A 5 mm stabilized rotating platform polyethylene insert was inserted and the knee was placed through a range of motion with excellent mediolateral soft tissue balancing appreciated and excellent patellar tracking noted. 2 medium drains were placed in the wound bed and brought out through separate stab incisions. The medial parapatellar portion of the incision was reapproximated using interrupted sutures of #1 Vicryl. Subcutaneous tissue was approximated in layers using first #0 Vicryl followed #2-0 Vicryl. The skin was approximated with skin staples. A sterile dressing was applied.  The patient tolerated the procedure well and was transported to the recovery room in stable condition.    Philip [name] P. Holley Bouche., M.D.

## 2021-03-07 NOTE — H&P (Signed)
The patient has been re-examined, and the chart reviewed, and there have been no interval changes to the documented history and physical.    The risks, benefits, and alternatives have been discussed at length. The patient expressed understanding of the risks benefits and agreed with plans for surgical intervention.  Toni Demo P. Michaila Kenney, Jr. M.D.    

## 2021-03-07 NOTE — Transfer of Care (Signed)
Immediate Anesthesia Transfer of Care Note  Patient: Philip LATOUR Sr.  Procedure(s) Performed: COMPUTER ASSISTED TOTAL RIGHT KNEE ARTHROPLASTY - (Right: Knee)  Patient Location: PACU  Anesthesia Type:Spinal  Level of Consciousness: awake, drowsy and patient cooperative  Airway & Oxygen Therapy: Patient Spontanous Breathing  Post-op Assessment: Report given to RN and Post -op Vital signs reviewed and stable  Post vital signs: Reviewed and stable  Last Vitals:  Vitals Value Taken Time  BP 104/64 03/07/21 1205  Temp    Pulse 77 03/07/21 1207  Resp 18 03/07/21 1207  SpO2 91 % 03/07/21 1207  Vitals shown include unvalidated device data.  Last Pain:  Vitals:   03/07/21 0723  TempSrc: Temporal  PainSc: 0-No pain         Complications: No notable events documented.

## 2021-03-07 NOTE — Progress Notes (Signed)
Ordered patient's lunch as requested.

## 2021-03-07 NOTE — Anesthesia Procedure Notes (Signed)
Spinal  Patient location during procedure: OR Start time: 03/07/2021 8:40 AM End time: 03/07/2021 8:48 AM Reason for block: surgical anesthesia Staffing Performed: anesthesiologist  Anesthesiologist: Emmie Niemann, MD Preanesthetic Checklist Completed: patient identified, IV checked, site marked, risks and benefits discussed, surgical consent, monitors and equipment checked, pre-op evaluation and timeout performed Spinal Block Patient position: sitting Prep: ChloraPrep Patient monitoring: heart rate, continuous pulse ox and blood pressure Approach: midline Location: L3-4 Injection technique: single-shot Needle Needle type: Introducer and Pencil-Tip  Needle gauge: 24 G Needle length: 9 cm Assessment Sensory level: T4 Events: CSF return

## 2021-03-08 ENCOUNTER — Encounter: Payer: Self-pay | Admitting: Orthopedic Surgery

## 2021-03-08 DIAGNOSIS — J449 Chronic obstructive pulmonary disease, unspecified: Secondary | ICD-10-CM | POA: Diagnosis not present

## 2021-03-08 DIAGNOSIS — I2510 Atherosclerotic heart disease of native coronary artery without angina pectoris: Secondary | ICD-10-CM | POA: Diagnosis not present

## 2021-03-08 DIAGNOSIS — E039 Hypothyroidism, unspecified: Secondary | ICD-10-CM | POA: Diagnosis not present

## 2021-03-08 DIAGNOSIS — Z87891 Personal history of nicotine dependence: Secondary | ICD-10-CM | POA: Diagnosis not present

## 2021-03-08 DIAGNOSIS — Z7901 Long term (current) use of anticoagulants: Secondary | ICD-10-CM | POA: Diagnosis not present

## 2021-03-08 DIAGNOSIS — Z79899 Other long term (current) drug therapy: Secondary | ICD-10-CM | POA: Diagnosis not present

## 2021-03-08 DIAGNOSIS — E119 Type 2 diabetes mellitus without complications: Secondary | ICD-10-CM | POA: Diagnosis not present

## 2021-03-08 DIAGNOSIS — Z8679 Personal history of other diseases of the circulatory system: Secondary | ICD-10-CM | POA: Diagnosis not present

## 2021-03-08 DIAGNOSIS — I509 Heart failure, unspecified: Secondary | ICD-10-CM | POA: Diagnosis not present

## 2021-03-08 DIAGNOSIS — Z7984 Long term (current) use of oral hypoglycemic drugs: Secondary | ICD-10-CM | POA: Diagnosis not present

## 2021-03-08 DIAGNOSIS — M1711 Unilateral primary osteoarthritis, right knee: Secondary | ICD-10-CM | POA: Diagnosis not present

## 2021-03-08 LAB — GLUCOSE, CAPILLARY
Glucose-Capillary: 200 mg/dL — ABNORMAL HIGH (ref 70–99)
Glucose-Capillary: 209 mg/dL — ABNORMAL HIGH (ref 70–99)

## 2021-03-08 MED ORDER — CELECOXIB 200 MG PO CAPS
ORAL_CAPSULE | ORAL | Status: AC
  Filled 2021-03-08: qty 1

## 2021-03-08 MED ORDER — TRAMADOL HCL 50 MG PO TABS
50.0000 mg | ORAL_TABLET | ORAL | 0 refills | Status: DC | PRN
Start: 2021-03-08 — End: 2021-08-11

## 2021-03-08 MED ORDER — METOPROLOL TARTRATE 25 MG PO TABS
ORAL_TABLET | ORAL | Status: AC
  Filled 2021-03-08: qty 3

## 2021-03-08 MED ORDER — CELECOXIB 200 MG PO CAPS: 200.0000 mg | ORAL_CAPSULE | Freq: Two times a day (BID) | ORAL | 0 refills | Status: DC

## 2021-03-08 MED ORDER — TRAMADOL HCL 50 MG PO TABS
ORAL_TABLET | ORAL | Status: AC
  Filled 2021-03-08: qty 1

## 2021-03-08 MED ORDER — OXYCODONE HCL 5 MG PO TABS
ORAL_TABLET | ORAL | Status: AC
  Administered 2021-03-08: 5 mg via ORAL
  Filled 2021-03-08: qty 1

## 2021-03-08 MED ORDER — INSULIN ASPART 100 UNIT/ML IJ SOLN
INTRAMUSCULAR | Status: AC
  Filled 2021-03-08: qty 1

## 2021-03-08 MED ORDER — GABAPENTIN 100 MG PO CAPS
ORAL_CAPSULE | ORAL | Status: AC
  Administered 2021-03-08: 100 mg via ORAL
  Filled 2021-03-08: qty 1

## 2021-03-08 MED ORDER — MAGNESIUM HYDROXIDE 400 MG/5ML PO SUSP
ORAL | Status: AC
  Filled 2021-03-08: qty 30

## 2021-03-08 MED ORDER — CELECOXIB 200 MG PO CAPS
ORAL_CAPSULE | ORAL | Status: AC
  Administered 2021-03-08: 200 mg via ORAL
  Filled 2021-03-08: qty 1

## 2021-03-08 MED ORDER — OXYCODONE HCL 5 MG PO TABS: 5.0000 mg | ORAL_TABLET | ORAL | 0 refills | Status: DC | PRN

## 2021-03-08 MED ORDER — METOCLOPRAMIDE HCL 10 MG PO TABS
ORAL_TABLET | ORAL | Status: AC
  Administered 2021-03-08: 10 mg via ORAL
  Filled 2021-03-08: qty 1

## 2021-03-08 NOTE — Discharge Summary (Signed)
Physician Discharge Summary  Patient ID: ROSCO HARRIOTT Sr. MRN: [id number] DOB/AGE: Dec 24, 1945 75 y.o.  Admit date: 03/07/2021 Discharge date: 03/08/2021  Admission Diagnoses:  Total knee replacement status [Z96.659]  Surgeries:Procedure(s): Right total knee arthroplasty using computer-assisted navigation   SURGEON:  Marciano Sequin. M.D.   ASSISTANT: Cassell Smiles, [name] (present and scrubbed throughout the case, critical for assistance with exposure, retraction, instrumentation, and closure)   ANESTHESIA: spinal   ESTIMATED BLOOD LOSS: 50 mL   FLUIDS REPLACED: 1200 mL of crystalloid   TOURNIQUET TIME: 80 minutes   DRAINS: 2 medium Hemovac drains   SOFT TISSUE RELEASES: Anterior cruciate ligament, posterior cruciate ligament, deep  medial collateral ligament, patellofemoral ligament   IMPLANTS UTILIZED: DePuy Attune size 7 posterior stabilized femoral component (cemented), size 7 rotating platform tibial component (cemented), 41 mm medialized dome patella (cemented), and a 5 mm stabilized rotating platform polyethylene insert.  Discharge Diagnoses: Patient Active Problem List   Diagnosis Date Noted   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   Primary osteoarthritis of right knee 02/13/2021   DDD (degenerative disc disease), lumbar 01/31/2021   Chronic right hip pain 11/19/2019   Thrush    MSSA bacteremia    Chronic systolic CHF (congestive heart failure) (HCC)    AF (paroxysmal atrial fibrillation) ([place])    HFrEF (heart failure with reduced ejection fraction) ([place])    Community acquired pneumonia of left lower lobe of lung    COPD with acute exacerbation ([place]) 06/26/2019   Overweight (BMI 25.0-29.9) 07/01/2018   Radicular pain of lumbosacral region 01/03/2018   Cellulitis of right elbow 12/10/2017   Herpes zoster 12/10/2017   Right elbow pain 12/10/2017   Osteoarthritis of multiple joints 05/14/2017   Chronic pain of left knee 05/14/2017   OSA (obstructive sleep  apnea) 02/28/2017   CKD (chronic kidney disease), stage II 02/28/2017   Chronic anticoagulation 02/28/2017   Onychomycosis of left great toe 08/14/2016   Allergic rhinitis due to allergen 07/31/2016   Peripheral neuropathy 08/17/2015   Type 2 diabetes mellitus with peripheral neuropathy ([place]) 08/17/2015   (HFpEF) heart failure with preserved ejection fraction ([place]) 07/25/2015   Atrial fibrillation with rapid ventricular response ([place]) 12/10/2014   Centrilobular emphysema ([place]) 11/09/2014   Bradycardia 11/09/2014   Cardiac conduction disorder 11/09/2014   Benign hypertension with CKD (chronic kidney disease), stage II 11/09/2014   Bursitis of hip 11/09/2014   Enlarged prostate 11/09/2014   Coronary artery disease    S/P CABG x 3 05/12/2012   Hypothyroid 05/06/2012   Hyperlipidemia associated with type 2 diabetes mellitus ([place]) 03/31/2012   Colon polyp 12/21/2003    Past Medical History:  Diagnosis Date   (HFpEF) heart failure with preserved ejection fraction ([place])    a. 06/2015 Echo: EF 50-55%, Gr1 DD. Mild MR. Mildly dil LA. Nl RV fxn. Nl PASP.   Arthritis    a. right knee   Asthma    Atrial tachycardia (HCC)    Chronic anticoagulation    Apixaban   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 etiology.   COPD (chronic obstructive pulmonary disease) (HCC)    Coronary artery disease    a. 05/2012 Cath: severe 3VD-->CABG x 3 by Dr. Servando Snare in 01/07/2014[place], SVG->LCX & SVG->RPDA; b. 06/2014 MV: no ischemia/infarct.   DDD (degenerative disc disease), lumbar    Dyspnea    Edema    LEGS/ FEET   Essential hypertension    History of Helicobacter pylori infection  HNP (herniated nucleus pulposus), lumbar    HOH (hard of hearing)    Hypercholesteremia    Hypothyroidism    Low back pain radiating to lower extremity    Lumbar radiculitis    Lumbar stenosis with neurogenic claudication    Neuropathy    PAF (paroxysmal atrial fibrillation) (HCC)    a. CHA2DS2VASc = 5 (age,  CHF, HTN, vascular disase, T2DM) --> Eliquis.   Sepsis ([place]) 2021   Shingles    Sleep apnea    NO CPAP   Type II diabetes mellitus ([place])      Transfusion:    Consultants (if any):   Discharged Condition: Improved  Hospital Course: Jatavious Peppard. is an 75 y.o. adult who was admitted 03/07/2021 with a diagnosis of right knee osteoarthritis and went to the operating room on 03/07/2021 and underwent right total knee arthroplasty. The patient received perioperative antibiotics for prophylaxis (see below). The patient tolerated the procedure well and was transported to PACU in stable condition. After meeting PACU criteria, the patient was subsequently transferred to the Orthopaedics/Rehabilitation unit.   The patient received DVT prophylaxis in the form of early mobilization, Foot Pumps, TED hose, and Eliquis . A sacral pad had been placed and heels were elevated off of the bed with rolled towels in order to protect skin integrity. Foley catheter was discontinued on postoperative day #0. Wound drains were discontinued on postoperative day #2. The surgical incision was healing well without signs of infection.  Physical therapy was initiated postoperatively for transfers, gait training, and strengthening. Occupational therapy was initiated for activities of daily living and evaluation for assisted devices. Rehabilitation goals were reviewed in detail with the patient. The patient made steady progress with physical therapy and physical therapy recommended discharge to Home.   The patient achieved the preliminary goals of this hospitalization and was felt to be medically and orthopaedically appropriate for discharge.  He was given perioperative antibiotics:  Anti-infectives (From admission, onward)    Start     Dose/Rate Route Frequency Ordered Stop   03/07/21 2048  ceFAZolin (ANCEF) 2-4 GM/100ML-% IVPB       Note to Pharmacy: Lyman Bishop   : cabinet override      03/07/21 2048 03/07/21 2119    03/07/21 1500  ceFAZolin (ANCEF) IVPB 2g/100 mL premix        2 g 200 mL/hr over 30 Minutes Intravenous Every 6 hours 03/07/21 1300 03/07/21 2119   03/07/21 0600  ceFAZolin (ANCEF) IVPB 2g/100 mL premix  Status:  Discontinued        2 g 200 mL/hr over 30 Minutes Intravenous On call to O.R. 03/07/21 0401 03/07/21 1603     .  Recent vital signs:  Vitals:   03/08/21 0730 03/08/21 1102  BP: 119/72 114/66  Pulse: 75 87  Resp: 16 17  Temp: (!) 97.1 F (36.2 C) 98 F (36.7 C)  SpO2: 96% 96%    Recent laboratory studies:  No results for input(s): WBC, HGB, HCT, PLT, K, CL, CO2, BUN, CREATININE, GLUCOSE, CALCIUM, LABPT, INR in the last 72 hours.  Diagnostic Studies: DG Knee Right Port  Result Date: 03/07/2021 CLINICAL DATA:  Post RIGHT total knee replacement in a 75 year old male. EXAM: PORTABLE RIGHT KNEE - 1-2 VIEW COMPARISON:  None FINDINGS: Postoperative changes of RIGHT total knee arthroplasty without unexpected findings. Surgical drains in soft tissues and skin staples overlie the surgical site. Alignment is anatomic. Small amount of gas in the joint and adjacent soft  tissues. IMPRESSION: Expected postoperative appearance of RIGHT total knee arthroplasty. Electronically Signed   By: Zetta Bills M.D.   On: 03/07/2021 13:01    Discharge Medications:   Allergies as of 03/08/2021       Reactions   Bydureon [exenatide] Anaphylaxis   Possible from bydureon, not 100% confirmed   Clindamycin/lincomycin Hives, Itching   Penicillins Hives, Itching, Rash, Other (See Comments)   Has patient had a PCN reaction causing immediate rash, facial/tongue/throat swelling, SOB or lightheadedness with hypotension: Yes Has patient had a PCN reaction causing severe rash involving mucus membranes or skin necrosis: No Has patient had a PCN reaction that required hospitalization: No Has patient had a PCN reaction occurring within the last 10 years: No If all of the above answers are "NO", then may  proceed with Cephalosporin use.   Sulfa Antibiotics Shortness Of Breath   Spiriferis Other (See Comments)   Affected breathing   Spiriva Handihaler [tiotropium Bromide Monohydrate]    Affected breathing   Ivp Dye [iodinated Diagnostic Agents] Rash, Other (See Comments)   Redness        Medication List     TAKE these medications    acetaminophen 500 MG tablet Commonly known as: TYLENOL Take 1,000-1,500 mg by mouth every 8 (eight) hours as needed for moderate pain or mild pain.   Albuterol Sulfate 108 (90 Base) MCG/ACT Aepb Commonly known as: PROAIR RESPICLICK Inhale 2 puffs into the lungs every 6 (six) hours as needed (shortness of breath).   apixaban 5 MG Tabs tablet Commonly known as: ELIQUIS Take 5 mg by mouth 2 (two) times daily.   ARTIFICIAL TEAR SOLUTION OP Place 1 drop into both eyes daily.   atorvastatin 40 MG tablet Commonly known as: LIPITOR Take 1 tablet (40 mg total) by mouth daily. What changed: when to take this   calcium carbonate 500 MG chewable tablet Commonly known as: TUMS - dosed in mg elemental calcium Chew 1,000 mg by mouth daily as needed for indigestion or heartburn.   celecoxib 200 MG capsule Commonly known as: CELEBREX Take 1 capsule (200 mg total) by mouth 2 (two) times daily.   cetirizine 10 MG tablet Commonly known as: ZYRTEC Take 10 mg by mouth 2 (two) times daily.   Euthyrox 88 MCG tablet Generic drug: levothyroxine Take 1 tablet (88 mcg total) by mouth daily before breakfast.   Farxiga 10 MG Tabs tablet Generic drug: dapagliflozin propanediol Take 1 tablet (10 mg total) by mouth daily before breakfast.   fluticasone 50 MCG/ACT nasal spray Commonly known as: FLONASE Place 1 spray into both nostrils daily as needed for allergies or rhinitis.   furosemide 40 MG tablet Commonly known as: LASIX Take 0.5-1 tablets (20-40 mg total) by mouth daily as needed for edema. Only as needed now.   gabapentin 100 MG capsule Commonly  known as: NEURONTIN Take 1 capsule (100 mg total) by mouth 3 (three) times daily.   ipratropium-albuterol 0.5-2.5 (3) MG/3ML Soln Commonly known as: DUONEB Take 3 mLs by nebulization every 6 (six) hours as needed (shortness of breath).   Lancets Ultra Thin Misc 1 Device by Does not apply route 4 (four) times daily -  before meals and at bedtime.   losartan 25 MG tablet Commonly known as: COZAAR Take 0.5 tablets (12.5 mg total) by mouth daily. What changed: when to take this   metFORMIN 500 MG tablet Commonly known as: GLUCOPHAGE Take 2 tablets (1,000 mg total) by mouth 2 (two) times daily with  a meal.   metoprolol tartrate 50 MG tablet Commonly known as: LOPRESSOR Take 1.5 tablets (75 mg total) by mouth 2 (two) times daily.   montelukast 10 MG tablet Commonly known as: SINGULAIR Take 10 mg by mouth at bedtime.   ONE TOUCH ULTRA 2 w/Device Kit Use to check blood sugar as advised up to twice a day   OneTouch Ultra test strip Generic drug: glucose blood USE TO CHECK BLOOD SUGAR TWICE DAILY   oxyCODONE 5 MG immediate release tablet Commonly known as: Oxy IR/ROXICODONE Take 1 tablet (5 mg total) by mouth every 4 (four) hours as needed for severe pain.   spironolactone 25 MG tablet Commonly known as: ALDACTONE Take 0.5 tablets (12.5 mg total) by mouth daily.   Symbicort 160-4.5 MCG/ACT inhaler Generic drug: budesonide-formoterol Inhale 2 puffs 2 (two) times daily into the lungs.   traMADol 50 MG tablet Commonly known as: ULTRAM Take 1 tablet (50 mg total) by mouth every 4 (four) hours as needed for moderate pain.               Durable Medical Equipment  (From admission, onward)           Start     Ordered   03/07/21 1301  DME Walker rolling  Once       Question:  Patient needs a walker to treat with the following condition  Answer:  Total knee replacement status   03/07/21 1300   03/07/21 1301  DME Bedside commode  Once       Question:  Patient needs a  bedside commode to treat with the following condition  Answer:  Total knee replacement status   03/07/21 1300            Disposition: Home with home health PT     Follow-up Information     Fausto Skillern, [name] Follow up on 03/22/2021.   Specialty: Orthopedic Surgery Why: at 9:45am Contact information: [place] [address] [phone]         Dereck Leep, MD Follow up on 04/21/2021.   Specialty: Orthopedic Surgery Why: at 10:30am Contact information: [place] [address] [place], [name] 03/08/2021, 12:24 PM

## 2021-03-08 NOTE — Evaluation (Signed)
Occupational Therapy Evaluation Patient Details Name: Philip SARVER Sr. MRN: [id number] DOB: 1946/04/25 Today's Date: 03/08/2021   History of Present Illness 75 yo male s/p R TKA. Of note, the patient with recent L3-4 discectomy (01/07/2021)   Clinical Impression   Pt seen for OT evaluation this date, POD#1 from above surgery. Pt was independent in all ADLs prior to surgery, however occasionally required increased time/effort during ADLs/functional mobility due to R knee pain. Pt is eager to return to PLOF with less pain and improved safety and independence. Pt currently requires MIN GUARD for LB dressing while in seated position due to pain and limited AROM of R knee. Pt also requires MIN GUARD for toilet transfers and SUPERVISION for standing grooming tasks due to decreased balance and limited AROM of R knee. Pt instructed in polar care mgt, falls prevention strategies, home/routines modifications, and DME/AE for LB bathing and dressing tasks. Pt verbalized understanding of all education/training provided. Handout provided to support recall and carry over of learned precautions/techniques. Pt would benefit from skilled OT services including additional instruction in techniques, with or without assistive devices, for dressing and bathing skills to support recall and carryover prior to discharge and ultimately to maximize safety, independence, and minimize falls risk and caregiver burden. Do not currently anticipate any OT needs following this hospitalization.        Recommendations for follow up therapy are one component of a multi-disciplinary discharge planning process, led by the attending physician.  Recommendations may be updated based on patient status, additional functional criteria and insurance authorization.   Follow Up Recommendations  No OT follow up    Assistance Recommended at Discharge Intermittent Supervision/Assistance  Functional Status Assessment  Patient has had a recent  decline in their functional status and demonstrates the ability to make significant improvements in function in a reasonable and predictable amount of time.  Equipment Recommendations  None recommended by OT       Precautions / Restrictions Precautions Precautions: Knee;Fall Restrictions Weight Bearing Restrictions: Yes RLE Weight Bearing: Weight bearing as tolerated      Mobility Bed Mobility               General bed mobility comments: not assessed, pt in recliner at beginning/end of session    Transfers Overall transfer level: Needs assistance Equipment used: Rolling walker (2 wheels) Transfers: Sit to/from Stand Sit to Stand: Min guard           General transfer comment: Requires verbal cues for safe hand placement with RW use      Balance Overall balance assessment: Needs assistance Sitting-balance support: No upper extremity supported;Feet supported Sitting balance-Leahy Scale: Good Sitting balance - Comments: Good sitting balance reaching outside BOS to don/doff socks   Standing balance support: No upper extremity supported;During functional activity Standing balance-Leahy Scale: Good Standing balance comment: SUPERVISION for standing grooming tasks with b/l UE unsupported                           ADL either performed or assessed with clinical judgement   ADL Overall ADL's : Needs assistance/impaired     Grooming: Wash/dry hands;Supervision/safety;Standing               Lower Body Dressing: Min guard;Sitting/lateral leans Lower Body Dressing Details (indicate cue type and reason): to don/doff socks Toilet Transfer: Min guard;Regular Toilet;Grab bars;Rolling walker (2 wheels)           Functional mobility during  ADLs: Supervision/safety;Rolling walker (2 wheels)       Vision Baseline Vision/History: 1 Wears glasses              Pertinent Vitals/Pain Pain Assessment: 0-10 Pain Score: 1  Pain Location: R knee Pain  Descriptors / Indicators: Aching Pain Intervention(s): Limited activity within patient's tolerance;Monitored during session        Extremity/Trunk Assessment Upper Extremity Assessment Upper Extremity Assessment: Overall WFL for tasks assessed   Lower Extremity Assessment Lower Extremity Assessment: Defer to PT evaluation;RLE deficits/detail RLE Deficits / Details: s/p R TKA       Communication Communication Communication: No difficulties   Cognition Arousal/Alertness: Awake/alert Behavior During Therapy: WFL for tasks assessed/performed Overall Cognitive Status: Within Functional Limits for tasks assessed                                          Exercises          Home Living Family/patient expects to be discharged to:: Private residence Living Arrangements: Spouse/significant other Available Help at Discharge: Family;Available 24 hours/day (spouse and adult children) Type of Home: House Home Access: Stairs to enter [employer] of Steps: 1+1 Entrance Stairs-Rails: None Home Layout: Two level;Laundry or work area in basement;Able to live on main level with bedroom/bathroom     Bathroom Shower/Tub: Teacher, early years/pre: Handicapped height     Home Equipment: Tub bench;Grab bars - toilet;Grab bars - tub/shower;Hand held shower head;Adaptive equipment Adaptive Equipment: Reacher        Prior Functioning/Environment Prior Level of Function : Independent/Modified Independent             Mobility Comments: Independent without AD for household and community mobility ADLs Comments: Independent with ADLs, however required increased time/effort for LB ADLs        OT Problem List: Decreased range of motion;Impaired balance (sitting and/or standing);Decreased knowledge of precautions;Pain      OT Treatment/Interventions: Self-care/ADL training;Therapeutic exercise;DME and/or AE instruction;Therapeutic  activities;Patient/family education;Balance training    OT Goals(Current goals can be found in the care plan section) Acute Rehab OT Goals Patient Stated Goal: to shower when safe to do so OT Goal Formulation: With patient Time For Goal Achievement: 03/22/21 Potential to Achieve Goals: Good ADL Goals Pt Will Perform Lower Body Bathing: with supervision;sit to/from stand Pt Will Perform Lower Body Dressing: with supervision;with adaptive equipment;sit to/from stand Pt Will Transfer to Toilet: with modified independence;ambulating;regular height toilet;grab bars  OT Frequency: Min 2X/week    AM-PAC OT "6 Clicks" Daily Activity     Outcome Measure Help from another person eating meals?: None Help from another person taking care of personal grooming?: A Little Help from another person toileting, which includes using toliet, bedpan, or urinal?: A Little Help from another person bathing (including washing, rinsing, drying)?: A Little Help from another person to put on and taking off regular upper body clothing?: None Help from another person to put on and taking off regular lower body clothing?: A Little 6 Click Score: 20   End of Session Equipment Utilized During Treatment: Rolling walker (2 wheels) Nurse Communication: Mobility status  Activity Tolerance: Patient tolerated treatment well Patient left: in chair;with call bell/phone within reach;with family/visitor present  OT Visit Diagnosis: Unsteadiness on feet (R26.81)                Time: 8841-6606 OT Time Calculation (  min): 23 min Charges:  OT General Charges $OT Visit: 1 Visit OT Evaluation $OT Eval Moderate Complexity: 1 Mod OT Treatments $Self Care/Home Management : 8-22 mins  Fredirick Maudlin, OTR/L [place]

## 2021-03-08 NOTE — Anesthesia Postprocedure Evaluation (Signed)
Anesthesia Post Note  Patient: Philip TORTORELLA Sr.  Procedure(s) Performed: COMPUTER ASSISTED TOTAL RIGHT KNEE ARTHROPLASTY - (Right: Knee)  Patient location during evaluation: Nursing Unit Anesthesia Type: Spinal Level of consciousness: awake and alert and oriented Pain management: pain level controlled Vital Signs Assessment: post-procedure vital signs reviewed and stable Respiratory status: spontaneous breathing Cardiovascular status: stable Postop Assessment: no headache, no backache, patient able to bend at knees, no apparent nausea or vomiting, able to ambulate and adequate PO intake Anesthetic complications: no   No notable events documented.   Last Vitals:  Vitals:   03/08/21 0400 03/08/21 0730  BP: 109/64 119/72  Pulse: 78 75  Resp: 16 16  Temp: (!) 36.1 C (!) 36.2 C  SpO2: 95% 96%    Last Pain:  Vitals:   03/08/21 0730  TempSrc: Temporal  PainSc:                  Lanora Manis

## 2021-03-08 NOTE — Progress Notes (Signed)
  Subjective: 1 Day Post-Op Procedure(s) (LRB): COMPUTER ASSISTED TOTAL RIGHT KNEE ARTHROPLASTY - (Right) Patient reports pain as well-controlled.   Patient is well, and has had no acute complaints or problems Plan is to go Home after hospital stay. Negative for chest pain and shortness of breath Fever: no Gastrointestinal: negative for nausea and vomiting.  Patient has not had a bowel movement.  Objective: Vital signs in last 24 hours: Temp:  [96.7 F (35.9 C)-98 F (36.7 C)] 97.1 F (36.2 C) (11/08 0730) Pulse Rate:  [55-101] 75 (11/08 0730) Resp:  [14-17] 16 (11/08 0730) BP: (101-119)/(60-74) 119/72 (11/08 0730) SpO2:  [90 %-96 %] 96 % (11/08 0730)  Intake/Output from previous day:  Intake/Output Summary (Last 24 hours) at 03/08/2021 0834 Last data filed at 03/08/2021 0552 Gross per 24 hour  Intake 1846.73 ml  Output 2515 ml  Net -668.27 ml    Intake/Output this shift: No intake/output data recorded.  Labs: No results for input(s): HGB in the last 72 hours. No results for input(s): WBC, RBC, HCT, PLT in the last 72 hours. No results for input(s): NA, K, CL, CO2, BUN, CREATININE, GLUCOSE, CALCIUM in the last 72 hours. No results for input(s): LABPT, INR in the last 72 hours.   EXAM General - Patient is Alert, Appropriate, and Oriented Extremity - Neurovascular intact Dorsiflexion/Plantar flexion intact Compartment soft Dressing/Incision -Postoperative dressing remains in place., Polar Care in place and working. , Hemovac in place.  Motor Function - intact, moving foot and toes well on exam. Able to perform independent SLR.  Cardiovascular- Regular rate and rhythm, no murmurs/rubs/gallops Respiratory- Lungs clear to auscultation bilaterally Gastrointestinal- soft, nontender, and active bowel sounds   Assessment/Plan: 1 Day Post-Op Procedure(s) (LRB): COMPUTER ASSISTED TOTAL RIGHT KNEE ARTHROPLASTY - (Right) Active Problems:   Total knee replacement  status  Estimated body mass index is 27.49 kg/m as calculated from the following:   Height as of this encounter: 5\' 11"  (1.803 m).   Weight as of this encounter: 89.4 kg. Advance diet Up with therapy  Possible d/c today pending completion of therapy goals.      DVT Prophylaxis - Eliquis, Ted hose, and foot pumps Weight-Bearing as tolerated to right leg  Cassell Smiles, [name] [facility] Orthopaedic Surgery 03/08/2021, 8:34 AM

## 2021-03-08 NOTE — Evaluation (Signed)
Physical Therapy Evaluation Patient Details Name: Philip SLAGER Sr. MRN: [id number] DOB: Sep 13, 1945 Today's Date: 03/08/2021  History of Present Illness  Pt is a 75 y.o. male s/p R TKA. PMH includes: COPD, HTN, CAD, CABG, CHF, a-fib, and DM. Surgical history includes recent L3-4 discectomy (01/07/2021).  Clinical Impression  Pt was pleasant and motivated to participate during the session and put forth good effort throughout. Pt was able to complete all ther ex supine in bed.  At the beginning of the session, SpO2 was 96% and HR was 96. Pt ambulated with CGA with step-to pattern that quickly progressed to step-through pattern and required min cuing when taking sharp turns. Pt was abel to complete stair training with CGA and min cuing for proper, safe sequencing. At the end of the session, pt's SpO2 was 98% and HR was 69. Pt will benefit from HHPT upon discharge to safely address deficits listed in patient problem list for decreased caregiver assistance and eventual return to PLOF.   Recommendations for follow up therapy are one component of a multi-disciplinary discharge planning process, led by the attending physician.  Recommendations may be updated based on patient status, additional functional criteria and insurance authorization.  Follow Up Recommendations Home health PT    Assistance Recommended at Discharge Intermittent Supervision/Assistance  Functional Status Assessment Patient has had a recent decline in their functional status and demonstrates the ability to make significant improvements in function in a reasonable and predictable amount of time.  Equipment Recommendations  Rolling walker (2 wheels)    Recommendations for Other Services       Precautions / Restrictions Precautions Precautions: Knee;Fall Precaution Booklet Issued: No Precaution Comments: Nothing placed under bent knee, no knee mobilizer required due to completing SLR Restrictions Weight Bearing Restrictions:  Yes RLE Weight Bearing: Weight bearing as tolerated      Mobility  Bed Mobility Overal bed mobility: Modified Independent             General bed mobility comments: Increased time and effort    Transfers Overall transfer level: Needs assistance Equipment used: Rolling walker (2 wheels) Transfers: Sit to/from Stand Sit to Stand: Min guard           General transfer comment: Increased time and effort, CGA for safety and stability    Ambulation/Gait Ambulation/Gait assistance: Min guard Gait Distance (Feet): 120 Feet x2 Assistive device: Rolling walker (2 wheels) Gait Pattern/deviations: Decreased step length - right;Decreased step length - left;Decreased stride length;Step-through pattern Gait velocity: decreased     General Gait Details: Increased time and effort, RLE cuing for sharp turns  Stairs Stairs: Yes Stairs assistance: Min guard Stair Management: No rails;With walker;Backwards Number of Stairs: 1 General stair comments: CGA for safety and stability, cuing on proper sequencing  Wheelchair Mobility    Modified Rankin (Stroke Patients Only)       Balance Overall balance assessment: Needs assistance Sitting-balance support: Bilateral upper extremity supported;Feet supported Sitting balance-Leahy Scale: Good Sitting balance - Comments: Good sitting balance reaching outside BOS to don/doff socks   Standing balance support: Bilateral upper extremity supported;During functional activity Standing balance-Leahy Scale: Good                              Pertinent Vitals/Pain Pain Assessment: No/denies pain Pain Score: 1  Pain Location: R knee Pain Descriptors / Indicators: Aching Pain Intervention(s): Limited activity within patient's tolerance;Monitored during session    Home Living Family/patient  expects to be discharged to:: Private residence Living Arrangements: Spouse/significant other Available Help at Discharge:  Family;Available 24 hours/day Type of Home: House Home Access: Stairs to enter Entrance Stairs-Rails: None Entrance Stairs-Number of Steps: 1+1 Alternate Level Stairs-Number of Steps: 15 Home Layout: Two level;Laundry or work area in basement;Able to live on main level with bedroom/bathroom Home Equipment: Tub bench;Grab bars - toilet;Grab bars - tub/shower      Prior Function Prior Level of Function : Independent/Modified Independent             Mobility Comments: Independent without AD for household and community mobility ADLs Comments: Independent with ADLs     Hand Dominance        Extremity/Trunk Assessment   Upper Extremity Assessment Upper Extremity Assessment: Generalized weakness    Lower Extremity Assessment Lower Extremity Assessment: Generalized weakness;RLE deficits/detail RLE Deficits / Details: s/p R TKA       Communication   Communication: No difficulties  Cognition Arousal/Alertness: Awake/alert Behavior During Therapy: WFL for tasks assessed/performed Overall Cognitive Status: Within Functional Limits for tasks assessed                                          General Comments      Exercises Total Joint Exercises Ankle Circles/Pumps: AROM;Strengthening;Both;10 reps;Supine Quad Sets: AROM;Strengthening;Both;10 reps;Supine Gluteal Sets: AROM;Strengthening;Both;10 reps;Supine Straight Leg Raises: AROM;Right;Supine (1 rep for knee mobilizer test) Goniometric ROM: R knee AROM: 10-98 degrees Marching in Standing: AROM;Strengthening;Both;10 reps;Standing Other Exercises Other Exercises: Education on proper stair sequencing Other Exercises: Education on car transfers Other Exercises: 90 deg right turn training to prevent CKC twisting on R knee, positioning education    Assessment/Plan    PT Assessment Patient needs continued PT services  PT Problem List Decreased strength;Decreased mobility;Decreased range of motion;Decreased  balance;Decreased knowledge of use of DME;Decreased knowledge of precautions       PT Treatment Interventions DME instruction;Gait training;Stair training;Functional mobility training;Therapeutic activities;Patient/family education;Balance training;Therapeutic exercise;Neuromuscular re-education    PT Goals (Current goals can be found in the Care Plan section)  Acute Rehab PT Goals Patient Stated Goal: Be able to do all ADLs and walk PT Goal Formulation: With patient Time For Goal Achievement: 03/21/21 Potential to Achieve Goals: Good    Frequency BID   Barriers to discharge        Co-evaluation               AM-PAC PT "6 Clicks" Mobility  Outcome Measure Help needed turning from your back to your side while in a flat bed without using bedrails?: A Little Help needed moving from lying on your back to sitting on the side of a flat bed without using bedrails?: A Little Help needed moving to and from a bed to a chair (including a wheelchair)?: A Little Help needed standing up from a chair using your arms (e.g., wheelchair or bedside chair)?: A Little Help needed to walk in hospital room?: A Little Help needed climbing 3-5 steps with a railing? : A Little 6 Click Score: 18    End of Session Equipment Utilized During Treatment: Gait belt Activity Tolerance: Patient tolerated treatment well Patient left: in chair;with call bell/phone within reach Nurse Communication: Mobility status PT Visit Diagnosis: Other abnormalities of gait and mobility (R26.89);Muscle weakness (generalized) (M62.81)    Time: 4431-5400 PT Time Calculation (min) (ACUTE ONLY): 47 min   Charges:  Sheldon Silvan SPT 03/08/21, 1:56 PM

## 2021-03-08 NOTE — Progress Notes (Signed)
Fausto Skillern PA at bedside hemovac removed , right knee dressing changed , discharge instructions reviewed with pt. And wife , IV removed Pt discharged home .

## 2021-03-08 NOTE — TOC Initial Note (Signed)
Transition of Care (TOC) - Initial/Assessment Note    Patient Details  Name: Philip WERT Sr. MRN: [id number] Date of Birth: 24-Feb-1946  Transition of Care [facility]) CM/SW Contact:    Anselm Pancoast, RN Phone Number: 03/08/2021, 1:31 PM  Clinical Narrative:                 Attempted to contact wife to discuss any discharge needs. No answer and no voicemail option.         Patient Goals and CMS Choice        Expected Discharge Plan and Services           Expected Discharge Date: 03/08/21                                    Prior Living Arrangements/Services                       Activities of Daily Living Home Assistive Devices/Equipment: Eyeglasses, Dentures (specify type) ADL Screening (condition at time of admission) Patient's cognitive ability adequate to safely complete daily activities?: Yes Is the patient deaf or have difficulty hearing?: No Does the patient have difficulty seeing, even when wearing glasses/contacts?: No Does the patient have difficulty concentrating, remembering, or making decisions?: No Patient able to express need for assistance with ADLs?: Yes Does the patient have difficulty dressing or bathing?: No Independently performs ADLs?: Yes (appropriate for developmental age) Does the patient have difficulty walking or climbing stairs?: Yes Weakness of Legs: None Weakness of Arms/Hands: None  Permission Sought/Granted                  Emotional Assessment              Admission diagnosis:  Total knee replacement status [Z96.659] Patient Active Problem List   Diagnosis Date Noted   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   Primary osteoarthritis of right knee 02/13/2021   DDD (degenerative disc disease), lumbar 01/31/2021   Chronic right hip pain 11/19/2019   Thrush    MSSA bacteremia    Chronic systolic CHF (congestive heart failure) (HCC)    AF (paroxysmal atrial fibrillation) ([place])    HFrEF (heart failure  with reduced ejection fraction) ([place])    Community acquired pneumonia of left lower lobe of lung    COPD with acute exacerbation ([place]) 06/26/2019   Overweight (BMI 25.0-29.9) 07/01/2018   Radicular pain of lumbosacral region 01/03/2018   Cellulitis of right elbow 12/10/2017   Herpes zoster 12/10/2017   Right elbow pain 12/10/2017   Osteoarthritis of multiple joints 05/14/2017   Chronic pain of left knee 05/14/2017   OSA (obstructive sleep apnea) 02/28/2017   CKD (chronic kidney disease), stage II 02/28/2017   Chronic anticoagulation 02/28/2017   Onychomycosis of left great toe 08/14/2016   Allergic rhinitis due to allergen 07/31/2016   Peripheral neuropathy 08/17/2015   Type 2 diabetes mellitus with peripheral neuropathy ([place]) 08/17/2015   (HFpEF) heart failure with preserved ejection fraction ([place]) 07/25/2015   Atrial fibrillation with rapid ventricular response ([place]) 12/10/2014   Centrilobular emphysema ([place]) 11/09/2014   Bradycardia 11/09/2014   Cardiac conduction disorder 11/09/2014   Benign hypertension with CKD (chronic kidney disease), stage II 11/09/2014   Bursitis of hip 11/09/2014   Enlarged prostate 11/09/2014   Coronary artery disease    S/P CABG x 3 05/12/2012   Hypothyroid 05/06/2012  Hyperlipidemia associated with type 2 diabetes mellitus ([place]) 03/31/2012   Colon polyp 12/21/2003   PCP:  [name], DO Pharmacy:   [facility] [address]), [place] [address] [place]) [place] [address] Phone: [phone] Fax: [phone]     Social Determinants of Health (SDOH) Interventions    Readmission Risk Interventions No flowsheet data found.

## 2021-03-09 DIAGNOSIS — N182 Chronic kidney disease, stage 2 (mild): Secondary | ICD-10-CM | POA: Diagnosis not present

## 2021-03-09 DIAGNOSIS — E039 Hypothyroidism, unspecified: Secondary | ICD-10-CM | POA: Diagnosis not present

## 2021-03-09 DIAGNOSIS — E1142 Type 2 diabetes mellitus with diabetic polyneuropathy: Secondary | ICD-10-CM | POA: Diagnosis not present

## 2021-03-09 DIAGNOSIS — H9190 Unspecified hearing loss, unspecified ear: Secondary | ICD-10-CM | POA: Diagnosis not present

## 2021-03-09 DIAGNOSIS — E1169 Type 2 diabetes mellitus with other specified complication: Secondary | ICD-10-CM | POA: Diagnosis not present

## 2021-03-09 DIAGNOSIS — J432 Centrilobular emphysema: Secondary | ICD-10-CM | POA: Diagnosis not present

## 2021-03-09 DIAGNOSIS — Z7951 Long term (current) use of inhaled steroids: Secondary | ICD-10-CM | POA: Diagnosis not present

## 2021-03-09 DIAGNOSIS — Z471 Aftercare following joint replacement surgery: Secondary | ICD-10-CM | POA: Diagnosis not present

## 2021-03-09 DIAGNOSIS — G4733 Obstructive sleep apnea (adult) (pediatric): Secondary | ICD-10-CM | POA: Diagnosis not present

## 2021-03-09 DIAGNOSIS — E7800 Pure hypercholesterolemia, unspecified: Secondary | ICD-10-CM | POA: Diagnosis not present

## 2021-03-09 DIAGNOSIS — I480 Paroxysmal atrial fibrillation: Secondary | ICD-10-CM | POA: Diagnosis not present

## 2021-03-09 DIAGNOSIS — I252 Old myocardial infarction: Secondary | ICD-10-CM | POA: Diagnosis not present

## 2021-03-09 DIAGNOSIS — I5030 Unspecified diastolic (congestive) heart failure: Secondary | ICD-10-CM | POA: Diagnosis not present

## 2021-03-09 DIAGNOSIS — M130 Polyarthritis, unspecified: Secondary | ICD-10-CM | POA: Diagnosis not present

## 2021-03-09 DIAGNOSIS — M5116 Intervertebral disc disorders with radiculopathy, lumbar region: Secondary | ICD-10-CM | POA: Diagnosis not present

## 2021-03-09 DIAGNOSIS — I5022 Chronic systolic (congestive) heart failure: Secondary | ICD-10-CM | POA: Diagnosis not present

## 2021-03-09 DIAGNOSIS — I2510 Atherosclerotic heart disease of native coronary artery without angina pectoris: Secondary | ICD-10-CM | POA: Diagnosis not present

## 2021-03-09 DIAGNOSIS — M48062 Spinal stenosis, lumbar region with neurogenic claudication: Secondary | ICD-10-CM | POA: Diagnosis not present

## 2021-03-09 DIAGNOSIS — Z7901 Long term (current) use of anticoagulants: Secondary | ICD-10-CM | POA: Diagnosis not present

## 2021-03-09 DIAGNOSIS — I130 Hypertensive heart and chronic kidney disease with heart failure and stage 1 through stage 4 chronic kidney disease, or unspecified chronic kidney disease: Secondary | ICD-10-CM | POA: Diagnosis not present

## 2021-03-09 DIAGNOSIS — Z79891 Long term (current) use of opiate analgesic: Secondary | ICD-10-CM | POA: Diagnosis not present

## 2021-03-09 DIAGNOSIS — Z7984 Long term (current) use of oral hypoglycemic drugs: Secondary | ICD-10-CM | POA: Diagnosis not present

## 2021-03-09 DIAGNOSIS — I471 Supraventricular tachycardia: Secondary | ICD-10-CM | POA: Diagnosis not present

## 2021-03-09 LAB — IGE: IgE (Immunoglobulin E), Serum: 14 IU/mL (ref 6–495)

## 2021-03-10 NOTE — Progress Notes (Signed)
Cardiology Office Note:    Date:  03/11/2021   ID:  [name], DOB 20-Sep-1945, MRN [id number]  PCP:  [name], DO  [facility] Cardiologist:  [name], MD  [facility] Electrophysiologist:  None   Referring MD: [name] *   Chief Complaint: 3 month follow-up  History of Present Illness:    Philip DESROCHES Sr. is a 75 y.o. adult with a hx of CAD s/p CABGx3 05/2012 with LIMA to LAD, SVG to RPDA, HFrEF, persistent atrial fibrillation on Eliquis, ectopic tachycardia, diastolic dysfunction, DM2, HLD, HTN who presents for CAD follow-up.    Cardiac cath 05/2012 showed severe 3V CAD recommended for CABG. Myoview 06/2014 showed evidence of ischemia. Echo 06/2015 with LVEF 5-55%, G1DD, mild MR, mildly dilated LA, normal RV systolic function, normal PASP.    H/o of afb on Eliquis. Previously on amiodarone but this was discontinued 11/2016 secondary concern for related side effects. He has not been interested in catheter ablation.    Admitted 06/2019 with sepsis due to PNA and MSSA bacteremia complicated by aflutter with RVR. 06/2019 showed LVEF 40-45%, global HK, moderately dilated LV, mildly reduced RV function with mildly larged RV size, moderately elevated PASP, mild biatrial enlargement, mild to mod MR. TEE 06/2019 showed LVEF 30-35%, no appendage thrombus detected, mild tom mod MR.    Seen by cardiology for hospitla follow-up 06/2019 and feeling well. He was in rate controlled afib with consideration for DCCV. Given cardiomyopathy he underwent Lexiscan MPI 07/28/2019 with no evidence of ischemia, EF 63% low risk study.    07/2018 labs showed elevated renal function and high normal potassium. His lasix was changed to as-needed and Losartan was held for 2 days. Repeat labs were normal.    Seen 07/2019 and was feeling well. Lasix was decreased to 72m daily. Echo 08/2019 showed LVEF 40-45%, global HK, indeterminate LV diastolic parameters.    Seen 05/31/20  and was doing well from a cardiac perspective. Euvolemic on exam and couldn't escalate medications due to hypotension.   Last seen 12/01/20 and spironolactone was added. Repeat limited echo 10/6 LVEF 40-45%, global HK, trivial MR.  Patient recently underwent Right knee replacement 03/07/21  Today, the patient reports he has been doing OK since the knee surgery,still painful. He is doing PT, using a walker for ambulation. HE is doing well from a cardiac perspective. Reports BP low generally 110/70. Unable to add Entresto in the past due to hypotension, they would like to stay on current meds and not [place] No chest pain. Chronic SOB from COPD which is unchanged. No LLE, orthopnea, pnd. He takes lasix as needed.   Past Medical History:  Diagnosis Date   (HFpEF) heart failure with preserved ejection fraction ([place]    a. 06/2015 Echo: EF 50-55%, Gr1 DD. Mild MR. Mildly dil LA. Nl RV fxn. Nl PASP.   Arthritis    a. right knee   Asthma    Atrial tachycardia (HCC)    Chronic anticoagulation    Apixaban   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 etiology.   COPD (chronic obstructive pulmonary disease) (HCC)    Coronary artery disease    a. 05/2012 Cath: severe 3VD-->CABG x 3 by Dr. GServando Snarein 05/07/2012:[place] SVG->LCX & SVG->RPDA; b. 06/2014 MV: no ischemia/infarct.   DDD (degenerative disc disease), lumbar    Dyspnea    Edema    LEGS/ FEET   Essential hypertension  History of Helicobacter pylori infection    HNP (herniated nucleus pulposus), lumbar    HOH (hard of hearing)    Hypercholesteremia    Hypothyroidism    Low back pain radiating to lower extremity    Lumbar radiculitis    Lumbar stenosis with neurogenic claudication    Neuropathy    PAF (paroxysmal atrial fibrillation) (HCC)    a. CHA2DS2VASc = 5 (age, CHF, HTN, vascular disase, T2DM) --> Eliquis.   Sepsis ([place]) 2021   Shingles    Sleep apnea    NO CPAP   Type II diabetes mellitus [facility])     Past  Surgical History:  Procedure Laterality Date   ANTERIOR CERVICAL DECOMP/DISCECTOMY FUSION  ~ 2009   CARDIAC CATHETERIZATION  05/06/2012   Significant three-vessel coronary artery disease with normal ejection fraction   CATARACT EXTRACTION W/PHACO Right 08/15/2017   Procedure: CATARACT EXTRACTION PHACO AND INTRAOCULAR LENS PLACEMENT ([place]);  Surgeon: Birder Robson, MD;  Location: [facility];  Service: Ophthalmology;  Laterality: Right;  [place] 00:23 AP% 13.3 CDE 3.11 Fluid pack lot # [id number]   CATARACT EXTRACTION W/PHACO Left 10/03/2017   Procedure: CATARACT EXTRACTION PHACO AND INTRAOCULAR LENS PLACEMENT (I[facility]);  Surgeon: Birder Robson, MD;  Location: [facility];  Service: Ophthalmology;  Laterality: Left;  Lot #[id number] H [place]: 00:21.2 AP%:13.9 CDE: 2.96   CORONARY ARTERY BYPASS GRAFT  05/07/2012   Procedure: CORONARY ARTERY BYPASS GRAFTING (CABG);  Surgeon: [name], MD;  Location: [place];  Service: Open Heart Surgery;  Laterality: N/A;  times three   INTRAOPERATIVE TRANSESOPHAGEAL ECHOCARDIOGRAM  05/07/2012   Procedure: INTRAOPERATIVE TRANSESOPHAGEAL ECHOCARDIOGRAM;  Surgeon: [name], MD;  Location: [place];  Service: Open Heart Surgery;  Laterality: N/A;   KNEE ARTHROPLASTY Right 03/07/2021   Procedure: COMPUTER ASSISTED TOTAL RIGHT KNEE ARTHROPLASTY -;  Surgeon: Dereck Leep, MD;  Location: [facility];  Service: Orthopedics;  Laterality: Right;   KNEE ARTHROSCOPY Right ~ 2000   KNEE SURGERY Right 2003   LUMBAR LAMINECTOMY/DECOMPRESSION MICRODISCECTOMY Right 01/07/2021   Procedure: RIGHT L3-4 MICRODISCECTOMY 1 LEVEL;  Surgeon: Meade Maw, MD;  Location: [facility];  Service: Neurosurgery;  Laterality: Right;   TEE WITHOUT CARDIOVERSION N/A 07/04/2019   Procedure: TRANSESOPHAGEAL ECHOCARDIOGRAM (TEE);  Surgeon: Nelva Bush, MD;  Location: [facility];  Service: Cardiovascular;  Laterality: N/A;    Current Medications: Current Meds  Medication Sig   acetaminophen  (TYLENOL) 500 MG tablet Take 1,000-1,500 mg by mouth every 8 (eight) hours as needed for moderate pain or mild pain.   Albuterol Sulfate 108 (90 Base) MCG/ACT AEPB Inhale 2 puffs into the lungs every 6 (six) hours as needed (shortness of breath).    apixaban (ELIQUIS) 5 MG TABS tablet Take 5 mg by mouth 2 (two) times daily.   ARTIFICIAL TEAR SOLUTION OP Place 1 drop into both eyes daily.   atorvastatin (LIPITOR) 40 MG tablet Take 1 tablet (40 mg total) by mouth daily. (Patient taking differently: Take 40 mg by mouth at bedtime.)   Blood Glucose Monitoring Suppl (ONE TOUCH ULTRA 2) w/Device KIT Use to check blood sugar as advised up to twice a day   calcium carbonate (TUMS - DOSED IN MG ELEMENTAL CALCIUM) 500 MG chewable tablet Chew 1,000 mg by mouth daily as needed for indigestion or heartburn.   celecoxib (CELEBREX) 200 MG capsule Take 1 capsule (200 mg total) by mouth 2 (two) times daily.   cetirizine (ZYRTEC) 10 MG tablet Take 10 mg by mouth 2 (two) times daily.  EUTHYROX 88 MCG tablet Take 1 tablet (88 mcg total) by mouth daily before breakfast.   FARXIGA 10 MG TABS tablet Take 1 tablet (10 mg total) by mouth daily before breakfast.   fluticasone (FLONASE) 50 MCG/ACT nasal spray Place 1 spray into both nostrils daily as needed for allergies or rhinitis.   furosemide (LASIX) 40 MG tablet Take 0.5-1 tablets (20-40 mg total) by mouth daily as needed for edema. Only as needed now.   gabapentin (NEURONTIN) 100 MG capsule Take 1 capsule (100 mg total) by mouth 3 (three) times daily.   ipratropium-albuterol (DUONEB) 0.5-2.5 (3) MG/3ML SOLN Take 3 mLs by nebulization every 6 (six) hours as needed (shortness of breath).   LANCETS ULTRA THIN MISC 1 Device by Does not apply route 4 (four) times daily -  before meals and at bedtime.   losartan (COZAAR) 25 MG tablet Take 0.5 tablets (12.5 mg total) by mouth daily. (Patient taking differently: Take 12.5 mg by mouth every morning.)   metFORMIN (GLUCOPHAGE)  500 MG tablet Take 2 tablets (1,000 mg total) by mouth 2 (two) times daily with a meal.   metoprolol tartrate (LOPRESSOR) 50 MG tablet Take 1.5 tablets (75 mg total) by mouth 2 (two) times daily.   montelukast (SINGULAIR) 10 MG tablet Take 10 mg by mouth at bedtime.   ONETOUCH ULTRA test strip USE TO CHECK BLOOD SUGAR TWICE DAILY   oxyCODONE (OXY IR/ROXICODONE) 5 MG immediate release tablet Take 1 tablet (5 mg total) by mouth every 4 (four) hours as needed for severe pain.   spironolactone (ALDACTONE) 25 MG tablet Take 0.5 tablets (12.5 mg total) by mouth daily.   SYMBICORT 160-4.5 MCG/ACT inhaler Inhale 2 puffs 2 (two) times daily into the lungs.    traMADol (ULTRAM) 50 MG tablet Take 1 tablet (50 mg total) by mouth every 4 (four) hours as needed for moderate pain.     Allergies:   Bydureon [exenatide], Clindamycin/lincomycin, Penicillins, Sulfa antibiotics, Tiotropium, Penicillin g, Spiriferis, Spiriva handihaler [tiotropium bromide monohydrate], Iodinated diagnostic agents, and Other   Social History   Socioeconomic History   Marital status: Married    Spouse name: Not on file   Number of children: Not on file   Years of education: Not on file   Highest education level: Some college, no degree  Occupational History   Occupation: retired  Tobacco Use   Smoking status: Former    Packs/day: 1.00    Years: 8.00    Pack years: 8.00    Types: Cigarettes    Quit date: 12/31/1967    Years since quitting: 53.2   Smokeless tobacco: Former  Scientific laboratory technician Use: Never used  Substance and Sexual Activity   Alcohol use: No    Alcohol/week: 0.0 standard drinks    Comment: 05/06/2012 "last alcohol several years ago; never had problem wit"   Drug use: No   Sexual activity: Not Currently  Other Topics Concern   Not on file  Social History Narrative   ** Merged History Encounter **          Working part time - not currently working because of covid-19    Social Determinants of Adult nurse Strain: Low Risk    Difficulty of Paying Living Expenses: Not hard at all  Food Insecurity: No Food Insecurity   Worried About Charity fundraiser in the Last Year: Never true   Arboriculturist in the Last Year: Never true  Transportation Needs: No Data processing manager (Medical): No   Lack of Transportation (Non-Medical): No  Physical Activity: Inactive   Days of Exercise per Week: 0 days   Minutes of Exercise per Session: 0 min  Stress: Stress Concern Present   Feeling of Stress : To some extent  Social Connections: Not on file     Family History: The patient's family history includes Clotting disorder in his mother; Heart disease in his mother; Hypertension in his sister.  ROS:   Please see the history of present illness.     All other systems reviewed and are negative.  EKGs/Labs/Other Studies Reviewed:    The following studies were reviewed today:  Echo limited 08/2019  1. Left ventricular ejection fraction, by estimation, is 40 to 45%. The  left ventricle has mildly decreased function. The left ventricle  demonstrates global hypokinesis. Left ventricular diastolic parameters are  indeterminate.   2. Right ventricular systolic function is normal. The right ventricular  size is normal. Tricuspid regurgitation signal is inadequate for assessing  PA pressure.   3. Left atrial size was moderately dilated.   4. The mitral valve is normal in structure. Moderate mitral valve  regurgitation. No evidence of mitral stenosis.   5. Tricuspid valve regurgitation is mild to moderate.    TEE 06/2019  1. Left ventricular ejection fraction, by estimation, is 30 to 35%. The  left ventricle has moderately decreased function. The left ventricle  demonstrates global hypokinesis.   2. Right ventricular systolic function is mildly reduced. The right  ventricular size is mildly enlarged.   3. No left atrial/left atrial appendage thrombus was  detected.   4. The mitral valve is degenerative. Mild to moderate mitral valve  regurgitation. No evidence of mitral stenosis.   5. The aortic valve is tricuspid. Aortic valve regurgitation is not  visualized. Mild aortic valve sclerosis is present, with no evidence of  aortic valve stenosis.   6. There is mild (Grade II) plaque involving the descending aorta.   Echo 06/2019  1. Left ventricular ejection fraction, by estimation, is 40 to 45%. The  left ventricle has mildly decreased function. The left ventricle  demonstrates global hypokinesis. The left ventricular internal cavity size  was moderately dilated. Left ventricular  diastolic parameters are indeterminate.   2. Right ventricular systolic function is mildly reduced. The right  ventricular size is mildly enlarged. There is moderately elevated  pulmonary artery systolic pressure.   3. Left atrial size was mildly dilated.   4. Right atrial size was mildly dilated.   5. The mitral valve is normal in structure and function. Mild to moderate  mitral valve regurgitation. No evidence of mitral stenosis.   6. Tricuspid valve regurgitation is mild to moderate.   7. No valve vegetation noted.    Myoview Lexiscan 06/2019 The study is normal. This is a low risk study. The left ventricular ejection fraction is normal (63%). There is no evidence for ischemia    EKG:  EKG is  ordered today.  The ekg ordered today demonstrates Afib, 78bpm, nonspecific T wave changes  Recent Labs: 01/31/2021: TSH 1.27 03/02/2021: ALT 20; BUN 17; Creatinine, Ser 0.92; Hemoglobin 15.7; Platelets 200; Potassium 4.4; Sodium 141  Recent Lipid Panel    Component Value Date/Time   CHOL 145 11/21/2019 0915   CHOL 139 12/14/2014 0834   TRIG 99 11/21/2019 0915   HDL 67 11/21/2019 0915   HDL 43 12/14/2014 0834   CHOLHDL 2.2  11/21/2019 0915   VLDL 20 11/21/2019 0915   LDLCALC 58 11/21/2019 0915   LDLCALC 61 06/24/2018 0846     Physical Exam:    VS:  BP  110/70 (BP Location: Left Arm, Patient Position: Sitting, Cuff Size: Normal)   Pulse 78   Ht 5' 11"  (1.803 m)   Wt 199 lb 6 oz (90.4 kg)   SpO2 97%   BMI 27.81 kg/m     Wt Readings from Last 3 Encounters:  03/11/21 199 lb 6 oz (90.4 kg)  03/07/21 197 lb 1.5 oz (89.4 kg)  03/02/21 197 lb 1.5 oz (89.4 kg)     GEN:  Well nourished, well developed in no acute distress HEENT: Normal NECK: No JVD; No carotid bruits LYMPHATICS: No lymphadenopathy CARDIAC: Irreg IRreg, no murmurs, rubs, gallops RESPIRATORY:  Clear to auscultation without rales, wheezing or rhonchi  ABDOMEN: Soft, non-tender, non-distended MUSCULOSKELETAL:  No edema; No deformity  SKIN: Warm and dry NEUROLOGIC:  Alert and oriented x 3 PSYCHIATRIC:  Normal affect   ASSESSMENT:    1. HFrEF (heart failure with reduced ejection fraction) ([place])   2. Benign hypertension with CKD (chronic kidney disease), stage II   3. S/P CABG (coronary artery bypass graft)   4. Coronary artery disease involving native coronary artery of native heart without angina pectoris   5. Cardiomyopathy, unspecified type ([place])   6. Hyperlipidemia LDL goal <70   7. Permanent atrial fibrillation ([place])   8. Hyperlipidemia, mixed    PLAN:    In order of problems listed above:  CAD s/p CABG Denies anginal symptoms. EKG with no ischemic changes. Continue BB, statin. No ASA with Eliquis. No further ischemic evaluation at this time.   HFrEF Mixed CM He is tolerating spironolactone well. He is euvolemic on exam today. He takes lasix as needed, based on daily weights. Repeat limited echo showed LVEF 40-45%. Discussed changing Losartan to [facility], however with history of hypotension they would like to wait at this time. Continue Lopressor, Farxiga, Spironolactone, and Losartan.   Permanent Afib Rate controlled with Lopressor 60m BID. Continue Eliquis for stroke ppx.    HLD LDL 58 10/2019. Continue Atorvastatin.   COPD Has chronic shortness of  breath, which is unchanged.   Disposition: Follow up in 3 month(s) with MD   Signed, Landri Dorsainvil HNinfa Meeker [name]  03/11/2021 9:51 AM    [facility]

## 2021-03-11 ENCOUNTER — Encounter: Payer: Self-pay | Admitting: Medical

## 2021-03-11 ENCOUNTER — Ambulatory Visit: Payer: Medicare Other | Admitting: Medical

## 2021-03-11 ENCOUNTER — Other Ambulatory Visit: Payer: Self-pay

## 2021-03-11 VITALS — BP 110/70 | HR 78 | Ht 71.0 in | Wt 199.4 lb

## 2021-03-11 DIAGNOSIS — E1169 Type 2 diabetes mellitus with other specified complication: Secondary | ICD-10-CM | POA: Diagnosis not present

## 2021-03-11 DIAGNOSIS — I130 Hypertensive heart and chronic kidney disease with heart failure and stage 1 through stage 4 chronic kidney disease, or unspecified chronic kidney disease: Secondary | ICD-10-CM | POA: Diagnosis not present

## 2021-03-11 DIAGNOSIS — I2510 Atherosclerotic heart disease of native coronary artery without angina pectoris: Secondary | ICD-10-CM | POA: Diagnosis not present

## 2021-03-11 DIAGNOSIS — I4821 Permanent atrial fibrillation: Secondary | ICD-10-CM | POA: Diagnosis not present

## 2021-03-11 DIAGNOSIS — Z951 Presence of aortocoronary bypass graft: Secondary | ICD-10-CM | POA: Diagnosis not present

## 2021-03-11 DIAGNOSIS — I129 Hypertensive chronic kidney disease with stage 1 through stage 4 chronic kidney disease, or unspecified chronic kidney disease: Secondary | ICD-10-CM | POA: Diagnosis not present

## 2021-03-11 DIAGNOSIS — G4733 Obstructive sleep apnea (adult) (pediatric): Secondary | ICD-10-CM | POA: Diagnosis not present

## 2021-03-11 DIAGNOSIS — E1142 Type 2 diabetes mellitus with diabetic polyneuropathy: Secondary | ICD-10-CM | POA: Diagnosis not present

## 2021-03-11 DIAGNOSIS — I5020 Unspecified systolic (congestive) heart failure: Secondary | ICD-10-CM

## 2021-03-11 DIAGNOSIS — M130 Polyarthritis, unspecified: Secondary | ICD-10-CM | POA: Diagnosis not present

## 2021-03-11 DIAGNOSIS — I471 Supraventricular tachycardia: Secondary | ICD-10-CM | POA: Diagnosis not present

## 2021-03-11 DIAGNOSIS — E785 Hyperlipidemia, unspecified: Secondary | ICD-10-CM

## 2021-03-11 DIAGNOSIS — Z471 Aftercare following joint replacement surgery: Secondary | ICD-10-CM | POA: Diagnosis not present

## 2021-03-11 DIAGNOSIS — Z79891 Long term (current) use of opiate analgesic: Secondary | ICD-10-CM | POA: Diagnosis not present

## 2021-03-11 DIAGNOSIS — M5116 Intervertebral disc disorders with radiculopathy, lumbar region: Secondary | ICD-10-CM | POA: Diagnosis not present

## 2021-03-11 DIAGNOSIS — E7800 Pure hypercholesterolemia, unspecified: Secondary | ICD-10-CM | POA: Diagnosis not present

## 2021-03-11 DIAGNOSIS — Z7901 Long term (current) use of anticoagulants: Secondary | ICD-10-CM | POA: Diagnosis not present

## 2021-03-11 DIAGNOSIS — I429 Cardiomyopathy, unspecified: Secondary | ICD-10-CM | POA: Diagnosis not present

## 2021-03-11 DIAGNOSIS — I5022 Chronic systolic (congestive) heart failure: Secondary | ICD-10-CM | POA: Diagnosis not present

## 2021-03-11 DIAGNOSIS — Z7984 Long term (current) use of oral hypoglycemic drugs: Secondary | ICD-10-CM | POA: Diagnosis not present

## 2021-03-11 DIAGNOSIS — J432 Centrilobular emphysema: Secondary | ICD-10-CM | POA: Diagnosis not present

## 2021-03-11 DIAGNOSIS — I5030 Unspecified diastolic (congestive) heart failure: Secondary | ICD-10-CM | POA: Diagnosis not present

## 2021-03-11 DIAGNOSIS — M48062 Spinal stenosis, lumbar region with neurogenic claudication: Secondary | ICD-10-CM | POA: Diagnosis not present

## 2021-03-11 DIAGNOSIS — I480 Paroxysmal atrial fibrillation: Secondary | ICD-10-CM | POA: Diagnosis not present

## 2021-03-11 DIAGNOSIS — Z7951 Long term (current) use of inhaled steroids: Secondary | ICD-10-CM | POA: Diagnosis not present

## 2021-03-11 DIAGNOSIS — I252 Old myocardial infarction: Secondary | ICD-10-CM | POA: Diagnosis not present

## 2021-03-11 DIAGNOSIS — H9190 Unspecified hearing loss, unspecified ear: Secondary | ICD-10-CM | POA: Diagnosis not present

## 2021-03-11 DIAGNOSIS — E039 Hypothyroidism, unspecified: Secondary | ICD-10-CM | POA: Diagnosis not present

## 2021-03-11 DIAGNOSIS — N182 Chronic kidney disease, stage 2 (mild): Secondary | ICD-10-CM | POA: Diagnosis not present

## 2021-03-11 DIAGNOSIS — E782 Mixed hyperlipidemia: Secondary | ICD-10-CM

## 2021-03-11 NOTE — Patient Instructions (Signed)
Medication Instructions:  Please continue your current medications   *If you need a refill on your cardiac medications before your next appointment, please call your pharmacy*  Lab Work: None  Testing/Procedures: None  Follow-Up: At [facility], you and your health needs are our priority.  As part of our continuing mission to provide you with exceptional heart care, we have created designated Provider Care Teams.  These Care Teams include your primary Cardiologist (physician) and Advanced Practice Providers (APPs -  Physician Assistants and Nurse Practitioners) who all work together to provide you with the care you need, when you need it.  We recommend signing up for the patient portal called "MyChart".  Sign up information is provided on this After Visit Summary.  MyChart is used to connect with patients for Virtual Visits (Telemedicine).  Patients are able to view lab/test results, encounter notes, upcoming appointments, etc.  Non-urgent messages can be sent to your provider as well.   To learn more about what you can do with MyChart, go to [email or web address].    Your next appointment:   3 month(s)  The format for your next appointment:   In Person  Provider:   [name], MD

## 2021-03-12 DIAGNOSIS — H9190 Unspecified hearing loss, unspecified ear: Secondary | ICD-10-CM | POA: Diagnosis not present

## 2021-03-12 DIAGNOSIS — E1142 Type 2 diabetes mellitus with diabetic polyneuropathy: Secondary | ICD-10-CM | POA: Diagnosis not present

## 2021-03-12 DIAGNOSIS — G4733 Obstructive sleep apnea (adult) (pediatric): Secondary | ICD-10-CM | POA: Diagnosis not present

## 2021-03-12 DIAGNOSIS — I480 Paroxysmal atrial fibrillation: Secondary | ICD-10-CM | POA: Diagnosis not present

## 2021-03-12 DIAGNOSIS — Z7901 Long term (current) use of anticoagulants: Secondary | ICD-10-CM | POA: Diagnosis not present

## 2021-03-12 DIAGNOSIS — I252 Old myocardial infarction: Secondary | ICD-10-CM | POA: Diagnosis not present

## 2021-03-12 DIAGNOSIS — I5030 Unspecified diastolic (congestive) heart failure: Secondary | ICD-10-CM | POA: Diagnosis not present

## 2021-03-12 DIAGNOSIS — N182 Chronic kidney disease, stage 2 (mild): Secondary | ICD-10-CM | POA: Diagnosis not present

## 2021-03-12 DIAGNOSIS — I5022 Chronic systolic (congestive) heart failure: Secondary | ICD-10-CM | POA: Diagnosis not present

## 2021-03-12 DIAGNOSIS — Z471 Aftercare following joint replacement surgery: Secondary | ICD-10-CM | POA: Diagnosis not present

## 2021-03-12 DIAGNOSIS — E1169 Type 2 diabetes mellitus with other specified complication: Secondary | ICD-10-CM | POA: Diagnosis not present

## 2021-03-12 DIAGNOSIS — I130 Hypertensive heart and chronic kidney disease with heart failure and stage 1 through stage 4 chronic kidney disease, or unspecified chronic kidney disease: Secondary | ICD-10-CM | POA: Diagnosis not present

## 2021-03-12 DIAGNOSIS — M5116 Intervertebral disc disorders with radiculopathy, lumbar region: Secondary | ICD-10-CM | POA: Diagnosis not present

## 2021-03-12 DIAGNOSIS — E039 Hypothyroidism, unspecified: Secondary | ICD-10-CM | POA: Diagnosis not present

## 2021-03-12 DIAGNOSIS — E7800 Pure hypercholesterolemia, unspecified: Secondary | ICD-10-CM | POA: Diagnosis not present

## 2021-03-12 DIAGNOSIS — I2510 Atherosclerotic heart disease of native coronary artery without angina pectoris: Secondary | ICD-10-CM | POA: Diagnosis not present

## 2021-03-12 DIAGNOSIS — Z79891 Long term (current) use of opiate analgesic: Secondary | ICD-10-CM | POA: Diagnosis not present

## 2021-03-12 DIAGNOSIS — J432 Centrilobular emphysema: Secondary | ICD-10-CM | POA: Diagnosis not present

## 2021-03-12 DIAGNOSIS — M130 Polyarthritis, unspecified: Secondary | ICD-10-CM | POA: Diagnosis not present

## 2021-03-12 DIAGNOSIS — Z7984 Long term (current) use of oral hypoglycemic drugs: Secondary | ICD-10-CM | POA: Diagnosis not present

## 2021-03-12 DIAGNOSIS — M48062 Spinal stenosis, lumbar region with neurogenic claudication: Secondary | ICD-10-CM | POA: Diagnosis not present

## 2021-03-12 DIAGNOSIS — Z7951 Long term (current) use of inhaled steroids: Secondary | ICD-10-CM | POA: Diagnosis not present

## 2021-03-12 DIAGNOSIS — I471 Supraventricular tachycardia: Secondary | ICD-10-CM | POA: Diagnosis not present

## 2021-03-14 DIAGNOSIS — G4733 Obstructive sleep apnea (adult) (pediatric): Secondary | ICD-10-CM | POA: Diagnosis not present

## 2021-03-14 DIAGNOSIS — I5030 Unspecified diastolic (congestive) heart failure: Secondary | ICD-10-CM | POA: Diagnosis not present

## 2021-03-14 DIAGNOSIS — M48062 Spinal stenosis, lumbar region with neurogenic claudication: Secondary | ICD-10-CM | POA: Diagnosis not present

## 2021-03-14 DIAGNOSIS — Z7901 Long term (current) use of anticoagulants: Secondary | ICD-10-CM | POA: Diagnosis not present

## 2021-03-14 DIAGNOSIS — I480 Paroxysmal atrial fibrillation: Secondary | ICD-10-CM | POA: Diagnosis not present

## 2021-03-14 DIAGNOSIS — Z79891 Long term (current) use of opiate analgesic: Secondary | ICD-10-CM | POA: Diagnosis not present

## 2021-03-14 DIAGNOSIS — Z7984 Long term (current) use of oral hypoglycemic drugs: Secondary | ICD-10-CM | POA: Diagnosis not present

## 2021-03-14 DIAGNOSIS — H9190 Unspecified hearing loss, unspecified ear: Secondary | ICD-10-CM | POA: Diagnosis not present

## 2021-03-14 DIAGNOSIS — I252 Old myocardial infarction: Secondary | ICD-10-CM | POA: Diagnosis not present

## 2021-03-14 DIAGNOSIS — E1142 Type 2 diabetes mellitus with diabetic polyneuropathy: Secondary | ICD-10-CM | POA: Diagnosis not present

## 2021-03-14 DIAGNOSIS — J432 Centrilobular emphysema: Secondary | ICD-10-CM | POA: Diagnosis not present

## 2021-03-14 DIAGNOSIS — N182 Chronic kidney disease, stage 2 (mild): Secondary | ICD-10-CM | POA: Diagnosis not present

## 2021-03-14 DIAGNOSIS — I471 Supraventricular tachycardia: Secondary | ICD-10-CM | POA: Diagnosis not present

## 2021-03-14 DIAGNOSIS — E039 Hypothyroidism, unspecified: Secondary | ICD-10-CM | POA: Diagnosis not present

## 2021-03-14 DIAGNOSIS — I5022 Chronic systolic (congestive) heart failure: Secondary | ICD-10-CM | POA: Diagnosis not present

## 2021-03-14 DIAGNOSIS — M5116 Intervertebral disc disorders with radiculopathy, lumbar region: Secondary | ICD-10-CM | POA: Diagnosis not present

## 2021-03-14 DIAGNOSIS — Z471 Aftercare following joint replacement surgery: Secondary | ICD-10-CM | POA: Diagnosis not present

## 2021-03-14 DIAGNOSIS — E7800 Pure hypercholesterolemia, unspecified: Secondary | ICD-10-CM | POA: Diagnosis not present

## 2021-03-14 DIAGNOSIS — I2510 Atherosclerotic heart disease of native coronary artery without angina pectoris: Secondary | ICD-10-CM | POA: Diagnosis not present

## 2021-03-14 DIAGNOSIS — M130 Polyarthritis, unspecified: Secondary | ICD-10-CM | POA: Diagnosis not present

## 2021-03-14 DIAGNOSIS — Z7951 Long term (current) use of inhaled steroids: Secondary | ICD-10-CM | POA: Diagnosis not present

## 2021-03-14 DIAGNOSIS — I130 Hypertensive heart and chronic kidney disease with heart failure and stage 1 through stage 4 chronic kidney disease, or unspecified chronic kidney disease: Secondary | ICD-10-CM | POA: Diagnosis not present

## 2021-03-14 DIAGNOSIS — E1169 Type 2 diabetes mellitus with other specified complication: Secondary | ICD-10-CM | POA: Diagnosis not present

## 2021-03-16 ENCOUNTER — Ambulatory Visit (INDEPENDENT_AMBULATORY_CARE_PROVIDER_SITE_OTHER): Payer: Medicare Other | Admitting: Pharmacist

## 2021-03-16 DIAGNOSIS — M5116 Intervertebral disc disorders with radiculopathy, lumbar region: Secondary | ICD-10-CM | POA: Diagnosis not present

## 2021-03-16 DIAGNOSIS — N182 Chronic kidney disease, stage 2 (mild): Secondary | ICD-10-CM | POA: Diagnosis not present

## 2021-03-16 DIAGNOSIS — E1142 Type 2 diabetes mellitus with diabetic polyneuropathy: Secondary | ICD-10-CM

## 2021-03-16 DIAGNOSIS — E1169 Type 2 diabetes mellitus with other specified complication: Secondary | ICD-10-CM | POA: Diagnosis not present

## 2021-03-16 DIAGNOSIS — E039 Hypothyroidism, unspecified: Secondary | ICD-10-CM | POA: Diagnosis not present

## 2021-03-16 DIAGNOSIS — I480 Paroxysmal atrial fibrillation: Secondary | ICD-10-CM | POA: Diagnosis not present

## 2021-03-16 DIAGNOSIS — I129 Hypertensive chronic kidney disease with stage 1 through stage 4 chronic kidney disease, or unspecified chronic kidney disease: Secondary | ICD-10-CM

## 2021-03-16 DIAGNOSIS — J432 Centrilobular emphysema: Secondary | ICD-10-CM | POA: Diagnosis not present

## 2021-03-16 DIAGNOSIS — G4733 Obstructive sleep apnea (adult) (pediatric): Secondary | ICD-10-CM | POA: Diagnosis not present

## 2021-03-16 DIAGNOSIS — M48062 Spinal stenosis, lumbar region with neurogenic claudication: Secondary | ICD-10-CM | POA: Diagnosis not present

## 2021-03-16 DIAGNOSIS — Z7901 Long term (current) use of anticoagulants: Secondary | ICD-10-CM | POA: Diagnosis not present

## 2021-03-16 DIAGNOSIS — Z7951 Long term (current) use of inhaled steroids: Secondary | ICD-10-CM | POA: Diagnosis not present

## 2021-03-16 DIAGNOSIS — H9190 Unspecified hearing loss, unspecified ear: Secondary | ICD-10-CM | POA: Diagnosis not present

## 2021-03-16 DIAGNOSIS — E7800 Pure hypercholesterolemia, unspecified: Secondary | ICD-10-CM | POA: Diagnosis not present

## 2021-03-16 DIAGNOSIS — I471 Supraventricular tachycardia: Secondary | ICD-10-CM | POA: Diagnosis not present

## 2021-03-16 DIAGNOSIS — Z79891 Long term (current) use of opiate analgesic: Secondary | ICD-10-CM | POA: Diagnosis not present

## 2021-03-16 DIAGNOSIS — Z7984 Long term (current) use of oral hypoglycemic drugs: Secondary | ICD-10-CM | POA: Diagnosis not present

## 2021-03-16 DIAGNOSIS — I252 Old myocardial infarction: Secondary | ICD-10-CM | POA: Diagnosis not present

## 2021-03-16 DIAGNOSIS — M130 Polyarthritis, unspecified: Secondary | ICD-10-CM | POA: Diagnosis not present

## 2021-03-16 DIAGNOSIS — Z471 Aftercare following joint replacement surgery: Secondary | ICD-10-CM | POA: Diagnosis not present

## 2021-03-16 DIAGNOSIS — I5022 Chronic systolic (congestive) heart failure: Secondary | ICD-10-CM | POA: Diagnosis not present

## 2021-03-16 DIAGNOSIS — I130 Hypertensive heart and chronic kidney disease with heart failure and stage 1 through stage 4 chronic kidney disease, or unspecified chronic kidney disease: Secondary | ICD-10-CM | POA: Diagnosis not present

## 2021-03-16 DIAGNOSIS — I5030 Unspecified diastolic (congestive) heart failure: Secondary | ICD-10-CM | POA: Diagnosis not present

## 2021-03-16 DIAGNOSIS — I2510 Atherosclerotic heart disease of native coronary artery without angina pectoris: Secondary | ICD-10-CM | POA: Diagnosis not present

## 2021-03-16 NOTE — Chronic Care Management (AMB) (Signed)
Chronic Care Management CCM Pharmacy Note  03/16/2021 Name:  Philip KALAS Sr. MRN:  [id number] DOB:  May 28, 1945   Subjective: Philip Potters Sr. is an 75 y.o. year old adult who is a primary patient of [name], DO.  The CCM team was consulted for assistance with disease management and care coordination needs.    Engaged with patient by telephone for follow up visit for pharmacy case management and/or care coordination services.   Objective:  Medications Reviewed Today     Reviewed by [name], RPH-CPP (Pharmacist) on 03/16/21 at 1009  Med List Status: <None>   Medication Order Taking? Sig Documenting Provider Last Dose Status Informant  acetaminophen (TYLENOL) 500 MG tablet [id number] Yes Take 1,000-1,500 mg by mouth every 8 (eight) hours as needed for moderate pain or mild pain. [provider] Taking Active Self  Albuterol Sulfate 108 (90 Base) MCG/ACT AEPB [id number] Yes Inhale 2 puffs into the lungs every 6 (six) hours as needed (shortness of breath).  [provider] Taking Active Self  apixaban (ELIQUIS) 5 MG TABS tablet [id number] Yes Take 5 mg by mouth 2 (two) times daily. [provider] Taking Active Self  ARTIFICIAL TEAR SOLUTION OP [id number]  Place 1 drop into both eyes daily. [provider]  Active Self  atorvastatin (LIPITOR) 40 MG tablet [id number]  Take 1 tablet (40 mg total) by mouth daily.  Patient taking differently: Take 40 mg by mouth at bedtime.   [name], MD  Active   Blood Glucose Monitoring Suppl (ONE TOUCH ULTRA 2) w/Device KIT [id number]  Use to check blood sugar as advised up to twice a day [name], DO  Active Self  calcium carbonate (TUMS - DOSED IN MG ELEMENTAL CALCIUM) 500 MG chewable tablet [id number]  Chew 1,000 mg by mouth daily as needed for indigestion or heartburn. [provider]  Active Self  celecoxib (CELEBREX) 200 MG capsule [id number] Yes Take 1  capsule (200 mg total) by mouth 2 (two) times daily. Fausto Skillern, [name] Taking Active   cetirizine (ZYRTEC) 10 MG tablet [id number] Yes Take 10 mg by mouth 2 (two) times daily. [provider] Taking Active Self  EUTHYROX 88 MCG tablet [id number]  Take 1 tablet (88 mcg total) by mouth daily before breakfast. [name], DO  Active Self  FARXIGA 10 MG TABS tablet [id number] Yes Take 1 tablet (10 mg total) by mouth daily before breakfast. [name], DO Taking Active Self  fluticasone (FLONASE) 50 MCG/ACT nasal spray [id number]  Place 1 spray into both nostrils daily as needed for allergies or rhinitis. [provider]  Active Self  furosemide (LASIX) 40 MG tablet [id number] Yes Take 0.5-1 tablets (20-40 mg total) by mouth daily as needed for edema. Only as needed now. [name], DO Taking Active Self  gabapentin (NEURONTIN) 100 MG capsule [id number]  Take 1 capsule (100 mg total) by mouth 3 (three) times daily. [name], [name], DO  Active Self  ipratropium-albuterol (DUONEB) 0.5-2.5 (3) MG/3ML SOLN [id number]  Take 3 mLs by nebulization every 6 (six) hours as needed (shortness of breath). [provider]  Active Self           Med Note Jilda Roche A   Fri Dec 24, 2020  4:04 PM)    Lanell Matar THIN MISC [id number]  1 Device by Does not apply route 4 (four) times daily -  before meals and at bedtime. Lanelle Bal  B, MD  Active Self  losartan (COZAAR) 25 MG tablet [id number] Yes Take 0.5 tablets (12.5 mg total) by mouth daily.  Patient taking differently: Take 12.5 mg by mouth every morning.   [name], MD Taking Active   metFORMIN (GLUCOPHAGE) 500 MG tablet [id number] Yes Take 2 tablets (1,000 mg total) by mouth 2 (two) times daily with a meal. [name], [name], DO Taking Active Self  metoprolol tartrate (LOPRESSOR) 50 MG tablet [id number] Yes Take 1.5 tablets (75 mg total) by mouth 2 (two) times daily.  Furth, [name], [name] Taking Active Self  montelukast (SINGULAIR) 10 MG tablet [id number] Yes Take 10 mg by mouth at bedtime. [provider] Taking Active Self  Donald Siva test strip [id number]  USE TO CHECK BLOOD SUGAR TWICE DAILY [name] [name], DO  Active Self  oxyCODONE (OXY IR/ROXICODONE) 5 MG immediate release tablet [id number] Yes Take 1 tablet (5 mg total) by mouth every 4 (four) hours as needed for severe pain. Fausto Skillern, [name] Taking Active   spironolactone (ALDACTONE) 25 MG tablet [id number] Yes Take 0.5 tablets (12.5 mg total) by mouth daily. Kathlen Mody, [name], [name] Taking Active Self  SYMBICORT 160-4.5 MCG/ACT inhaler [id number] Yes Inhale 2 puffs 2 (two) times daily into the lungs.  [provider] Taking Active Self  traMADol (ULTRAM) 50 MG tablet [id number] Yes Take 1 tablet (50 mg total) by mouth every 4 (four) hours as needed for moderate pain. Fausto Skillern, [name] Taking Active             Pertinent Labs:   Lab Results  Component Value Date   HGBA1C 6.8 (A) 01/31/2021   Lab Results  Component Value Date   CHOL 145 11/21/2019   HDL 67 11/21/2019   LDLCALC 58 11/21/2019   TRIG 99 11/21/2019   CHOLHDL 2.2 11/21/2019   Lab Results  Component Value Date   CREATININE 0.92 03/02/2021   BUN 17 03/02/2021   NA 141 03/02/2021   K 4.4 03/02/2021   CL 107 03/02/2021   CO2 26 03/02/2021   BP Readings from Last 3 Encounters:  03/11/21 110/70  03/08/21 114/66  03/02/21 115/72   Pulse Readings from Last 3 Encounters:  03/11/21 78  03/08/21 87  03/02/21 68    SDOH:  (Social Determinants of Health) assessments and interventions performed:    [place]  Review of patient past medical history, allergies, medications, health status, including review of consultants reports, laboratory and other test data, was performed as part of comprehensive evaluation and provision of chronic care management services.   Care Plan : PharmD  - Medication Management/Medication Assistance  Updates made by [name], RPH-CPP since 03/16/2021 12:00 AM     Problem: Disease Progression      Long-Range Goal: Disease Progression Prevented or Minimized   Start Date: 06/16/2020  Expected End Date: 09/14/2020  This Visit's Progress: On track  Recent Progress: On track  Priority: High  Note:   Current Barriers:  Knowledge deficits related to dietary choices to improve blood sugar control Financial - difficulty with affording [name] through Medicare Part D health plan Patient APPROVED for patient assistance for Farxiga from [employer] through 04/30/2021 Patient APPROVED for patient assistance for Eliquis from [employer] from 01/06/21-04/30/21  Pharmacist Clinical Goal(s):  Over the next 90 days, patient will achieve control of T2DM as evidenced by A1C through collaboration with PharmD and provider.   Interventions: 1:1 collaboration with [name], DO regarding development  and update of comprehensive plan of care as evidenced by provider attestation and co-signature Inter-disciplinary care team collaboration (see longitudinal plan of care) Perform chart review Office Visit with [facility] Pulmonology on 10/18. Provider advised patient: Start Mucinex 500 mg q 12 hrs Start Zpak and Medrol dose pak F/u in 6 months, spirometry, cxr Restart using CPAP Office Visit with [facility] on 11/11 Medication Reconciliation was completed by comparing discharge summary, patient's EMR and Pharmacy list, and upon discussion with patient. Patient admitted to [facility] 11/7[facility]11[facility]8 for Right total knee arthroplasty   New?Medications Started at [facility] Discharge:?? Celecoxib 200 mg twice daily Oxycodone 5 mg every 4 hours as needed for severe pain Tramadol 50 mg every 4 hours as needed for moderate pain Reports doing well post-knee surgery. Reports knee pain now similar to level of pain prior to  surgery and hip pain resolved Reports currently working with home physical therapy three times/week. Appointment with PT today at 9 am Reports currently taking celecoxib 200 mg twice daily as directed. Discuss with patient for risks with NSAID use, including risk of bleeding as also on Eliquis. Patient will monitor Patient will discuss with knee surgeon at upcoming appointment benefit vs risk of continuing celeboxib Caution patient for risk of sedation/dizziness with oxycodone and tramadol Patient uses each as needed and never takes oxycodone and tramadol together - only using oxycodone as needed at bedtime and tramadol as needed during the day  Type 2 Diabetes Controlled; current treatment: metformin 500 mg - 2 tablets (1,000 mg) twice daily as directed Farxiga 10 mg - 1 tablet QAM   Review recent blood sugar results:  Fasting Blood Glucose After Supper (~2 hours)  10 - November 131 188  11 - November 110 132  12 - November 152 125  13 - November 138 197  14 - November 116 195  15 - November 168 153  16 - November 127   Average 134 165  Denies s/s of hypoglycemia  CHF/HTN Current treatment: Losartan 25 mg - 1/2 tablet (12.5 mg) once daily Metoprolol 75 mg twice daily Spironolactone 25 mg - 1/2 tablet (12.5 mg) daily (started last week) Farxiga 10 mg - 1 tablets daily Furosemide 40 mg daily as needed as directed by Cardiologist Reports recent BP results:   AM BP PM BP Note  10 - November 132/91, HR 78 103/78, HR 96   11 - November 128/89, HR 79 -   12 - November 132/80, HR 96 99/62, HR 78   13 - November 104/79, HR 72 110/78, HR 82   14 - November - -   15 - November 91/65, HR 80* 100/70, HR 82 *Felt fatigued  16 - November 112/79, HR 95    Reports felt fatigued with lower blood pressure reading yesterday morning. Note patient reports discussed recent low readings with Cardiologist on 11/11 Note patient weighs himself daily and follows directions from Cardiologist for use of  PRN furosemide (Lasix).  Counsel patient on importance of contacting Cardiologist for BP and HR readings outside of established parameters, particularly if continues to have low readings or new/worsening symtpoms  COPD/seasonal allergies: Patient follows with Dr. Raul Del at [facility] Pulmonology Current treatment: Symbicort 160/4.5 two puffs bid Montelukast 10 mg QHS Albuterol/ipratropium nebulizer solution/ albuterol inhaler as needed Cetirizine 10 mg daily Flonase nasal spray as needed Using saline Navage pods Counsel on importance of using maintenance (Symbicort) inhaler twice daily and rinsing mouth out after each use and using  rescue inhaler (albuterol) as needed Reports recently has noticed an increase in mucus production and describes mucus color as yellow/grey. Denies increase in shortness of breath, more diffiuclty with sleep or other symptoms. Denies need to discuss with Pulmonology today, but will monitor, discuss with Pulmonolgist at upcoming appointment on 10/18 and call office sooner for worsening or new symptoms  Medication Assistance: Patient to be auto re-enrolled in patient assistance for [place] with AZ[employer]Me for 2023 calendar year No action needed by patient at this time Will collaborate with [place] regarding provider portion of renewal/Rx to patient assistance program  Medication/Device Adherence Note patient uses weekly pillbox to organize medications   Patient Goals/Self-Care Activities Over the next 90 days, patient will:  Self administers medications as prescribed Attends all scheduled provider appointments Calls pharmacy for medication refills Calls provider office for new concerns or questions  Patient to check blood sugar regularly as directed and keep log Patient to keep log when checks blood pressure  Follow Up Plan: Telephone follow up appointment with care management team member scheduled for: 04/15/2021 at 10:00 AM        Wallace Cullens, PharmD, Para [place], [place] [phone]

## 2021-03-16 NOTE — Patient Instructions (Signed)
Visit Information  Our goal A1c is less than 7%. This corresponds with fasting sugars less than 130 and 2 hour after meal sugars less than 180. Please check your blood sugar and keep log of results  Please continue to monitor your home weight, blood pressure and heart rate  Our goal bad cholesterol, or LDL, is less than 70 . This is why it is important to continue taking your atorvastatin  Feel free to call me with any questions or concerns. I look forward to our next call!  Wallace Cullens, PharmD, [place] [phone]   The patient verbalized understanding of instructions, educational materials, and care plan provided today and declined offer to receive copy of patient instructions, educational materials, and care plan.   Telephone follow up appointment with care management team member scheduled for: 04/15/2021 at 10:00 AM

## 2021-03-17 DIAGNOSIS — I480 Paroxysmal atrial fibrillation: Secondary | ICD-10-CM | POA: Diagnosis not present

## 2021-03-17 DIAGNOSIS — G4733 Obstructive sleep apnea (adult) (pediatric): Secondary | ICD-10-CM | POA: Diagnosis not present

## 2021-03-17 DIAGNOSIS — J432 Centrilobular emphysema: Secondary | ICD-10-CM | POA: Diagnosis not present

## 2021-03-17 DIAGNOSIS — H9190 Unspecified hearing loss, unspecified ear: Secondary | ICD-10-CM | POA: Diagnosis not present

## 2021-03-17 DIAGNOSIS — I252 Old myocardial infarction: Secondary | ICD-10-CM | POA: Diagnosis not present

## 2021-03-17 DIAGNOSIS — E7800 Pure hypercholesterolemia, unspecified: Secondary | ICD-10-CM | POA: Diagnosis not present

## 2021-03-17 DIAGNOSIS — Z79891 Long term (current) use of opiate analgesic: Secondary | ICD-10-CM | POA: Diagnosis not present

## 2021-03-17 DIAGNOSIS — E1169 Type 2 diabetes mellitus with other specified complication: Secondary | ICD-10-CM | POA: Diagnosis not present

## 2021-03-17 DIAGNOSIS — N182 Chronic kidney disease, stage 2 (mild): Secondary | ICD-10-CM | POA: Diagnosis not present

## 2021-03-17 DIAGNOSIS — Z7984 Long term (current) use of oral hypoglycemic drugs: Secondary | ICD-10-CM | POA: Diagnosis not present

## 2021-03-17 DIAGNOSIS — M130 Polyarthritis, unspecified: Secondary | ICD-10-CM | POA: Diagnosis not present

## 2021-03-17 DIAGNOSIS — I130 Hypertensive heart and chronic kidney disease with heart failure and stage 1 through stage 4 chronic kidney disease, or unspecified chronic kidney disease: Secondary | ICD-10-CM | POA: Diagnosis not present

## 2021-03-17 DIAGNOSIS — E1142 Type 2 diabetes mellitus with diabetic polyneuropathy: Secondary | ICD-10-CM | POA: Diagnosis not present

## 2021-03-17 DIAGNOSIS — Z7951 Long term (current) use of inhaled steroids: Secondary | ICD-10-CM | POA: Diagnosis not present

## 2021-03-17 DIAGNOSIS — E039 Hypothyroidism, unspecified: Secondary | ICD-10-CM | POA: Diagnosis not present

## 2021-03-17 DIAGNOSIS — I2510 Atherosclerotic heart disease of native coronary artery without angina pectoris: Secondary | ICD-10-CM | POA: Diagnosis not present

## 2021-03-17 DIAGNOSIS — M5116 Intervertebral disc disorders with radiculopathy, lumbar region: Secondary | ICD-10-CM | POA: Diagnosis not present

## 2021-03-17 DIAGNOSIS — I471 Supraventricular tachycardia: Secondary | ICD-10-CM | POA: Diagnosis not present

## 2021-03-17 DIAGNOSIS — Z7901 Long term (current) use of anticoagulants: Secondary | ICD-10-CM | POA: Diagnosis not present

## 2021-03-17 DIAGNOSIS — Z471 Aftercare following joint replacement surgery: Secondary | ICD-10-CM | POA: Diagnosis not present

## 2021-03-17 DIAGNOSIS — I5022 Chronic systolic (congestive) heart failure: Secondary | ICD-10-CM | POA: Diagnosis not present

## 2021-03-17 DIAGNOSIS — I5030 Unspecified diastolic (congestive) heart failure: Secondary | ICD-10-CM | POA: Diagnosis not present

## 2021-03-17 DIAGNOSIS — M48062 Spinal stenosis, lumbar region with neurogenic claudication: Secondary | ICD-10-CM | POA: Diagnosis not present

## 2021-03-18 DIAGNOSIS — E1142 Type 2 diabetes mellitus with diabetic polyneuropathy: Secondary | ICD-10-CM | POA: Diagnosis not present

## 2021-03-18 DIAGNOSIS — I130 Hypertensive heart and chronic kidney disease with heart failure and stage 1 through stage 4 chronic kidney disease, or unspecified chronic kidney disease: Secondary | ICD-10-CM | POA: Diagnosis not present

## 2021-03-18 DIAGNOSIS — Z7901 Long term (current) use of anticoagulants: Secondary | ICD-10-CM | POA: Diagnosis not present

## 2021-03-18 DIAGNOSIS — E039 Hypothyroidism, unspecified: Secondary | ICD-10-CM | POA: Diagnosis not present

## 2021-03-18 DIAGNOSIS — I480 Paroxysmal atrial fibrillation: Secondary | ICD-10-CM | POA: Diagnosis not present

## 2021-03-18 DIAGNOSIS — I471 Supraventricular tachycardia: Secondary | ICD-10-CM | POA: Diagnosis not present

## 2021-03-18 DIAGNOSIS — H9190 Unspecified hearing loss, unspecified ear: Secondary | ICD-10-CM | POA: Diagnosis not present

## 2021-03-18 DIAGNOSIS — J432 Centrilobular emphysema: Secondary | ICD-10-CM | POA: Diagnosis not present

## 2021-03-18 DIAGNOSIS — M48062 Spinal stenosis, lumbar region with neurogenic claudication: Secondary | ICD-10-CM | POA: Diagnosis not present

## 2021-03-18 DIAGNOSIS — I2510 Atherosclerotic heart disease of native coronary artery without angina pectoris: Secondary | ICD-10-CM | POA: Diagnosis not present

## 2021-03-18 DIAGNOSIS — G4733 Obstructive sleep apnea (adult) (pediatric): Secondary | ICD-10-CM | POA: Diagnosis not present

## 2021-03-18 DIAGNOSIS — E1169 Type 2 diabetes mellitus with other specified complication: Secondary | ICD-10-CM | POA: Diagnosis not present

## 2021-03-18 DIAGNOSIS — Z7984 Long term (current) use of oral hypoglycemic drugs: Secondary | ICD-10-CM | POA: Diagnosis not present

## 2021-03-18 DIAGNOSIS — N182 Chronic kidney disease, stage 2 (mild): Secondary | ICD-10-CM | POA: Diagnosis not present

## 2021-03-18 DIAGNOSIS — I5022 Chronic systolic (congestive) heart failure: Secondary | ICD-10-CM | POA: Diagnosis not present

## 2021-03-18 DIAGNOSIS — M5116 Intervertebral disc disorders with radiculopathy, lumbar region: Secondary | ICD-10-CM | POA: Diagnosis not present

## 2021-03-18 DIAGNOSIS — Z471 Aftercare following joint replacement surgery: Secondary | ICD-10-CM | POA: Diagnosis not present

## 2021-03-18 DIAGNOSIS — E7800 Pure hypercholesterolemia, unspecified: Secondary | ICD-10-CM | POA: Diagnosis not present

## 2021-03-18 DIAGNOSIS — M130 Polyarthritis, unspecified: Secondary | ICD-10-CM | POA: Diagnosis not present

## 2021-03-18 DIAGNOSIS — Z7951 Long term (current) use of inhaled steroids: Secondary | ICD-10-CM | POA: Diagnosis not present

## 2021-03-18 DIAGNOSIS — I252 Old myocardial infarction: Secondary | ICD-10-CM | POA: Diagnosis not present

## 2021-03-18 DIAGNOSIS — I5030 Unspecified diastolic (congestive) heart failure: Secondary | ICD-10-CM | POA: Diagnosis not present

## 2021-03-18 DIAGNOSIS — Z79891 Long term (current) use of opiate analgesic: Secondary | ICD-10-CM | POA: Diagnosis not present

## 2021-03-21 ENCOUNTER — Telehealth: Payer: Self-pay | Admitting: Pharmacy Technician

## 2021-03-21 DIAGNOSIS — E039 Hypothyroidism, unspecified: Secondary | ICD-10-CM | POA: Diagnosis not present

## 2021-03-21 DIAGNOSIS — E1169 Type 2 diabetes mellitus with other specified complication: Secondary | ICD-10-CM | POA: Diagnosis not present

## 2021-03-21 DIAGNOSIS — I480 Paroxysmal atrial fibrillation: Secondary | ICD-10-CM | POA: Diagnosis not present

## 2021-03-21 DIAGNOSIS — E1142 Type 2 diabetes mellitus with diabetic polyneuropathy: Secondary | ICD-10-CM | POA: Diagnosis not present

## 2021-03-21 DIAGNOSIS — Z7901 Long term (current) use of anticoagulants: Secondary | ICD-10-CM | POA: Diagnosis not present

## 2021-03-21 DIAGNOSIS — N182 Chronic kidney disease, stage 2 (mild): Secondary | ICD-10-CM | POA: Diagnosis not present

## 2021-03-21 DIAGNOSIS — E7800 Pure hypercholesterolemia, unspecified: Secondary | ICD-10-CM | POA: Diagnosis not present

## 2021-03-21 DIAGNOSIS — I5022 Chronic systolic (congestive) heart failure: Secondary | ICD-10-CM | POA: Diagnosis not present

## 2021-03-21 DIAGNOSIS — M5116 Intervertebral disc disorders with radiculopathy, lumbar region: Secondary | ICD-10-CM | POA: Diagnosis not present

## 2021-03-21 DIAGNOSIS — Z7984 Long term (current) use of oral hypoglycemic drugs: Secondary | ICD-10-CM | POA: Diagnosis not present

## 2021-03-21 DIAGNOSIS — Z79891 Long term (current) use of opiate analgesic: Secondary | ICD-10-CM | POA: Diagnosis not present

## 2021-03-21 DIAGNOSIS — M130 Polyarthritis, unspecified: Secondary | ICD-10-CM | POA: Diagnosis not present

## 2021-03-21 DIAGNOSIS — J432 Centrilobular emphysema: Secondary | ICD-10-CM | POA: Diagnosis not present

## 2021-03-21 DIAGNOSIS — H9190 Unspecified hearing loss, unspecified ear: Secondary | ICD-10-CM | POA: Diagnosis not present

## 2021-03-21 DIAGNOSIS — Z596 Low income: Secondary | ICD-10-CM

## 2021-03-21 DIAGNOSIS — I252 Old myocardial infarction: Secondary | ICD-10-CM | POA: Diagnosis not present

## 2021-03-21 DIAGNOSIS — I471 Supraventricular tachycardia: Secondary | ICD-10-CM | POA: Diagnosis not present

## 2021-03-21 DIAGNOSIS — M48062 Spinal stenosis, lumbar region with neurogenic claudication: Secondary | ICD-10-CM | POA: Diagnosis not present

## 2021-03-21 DIAGNOSIS — Z7951 Long term (current) use of inhaled steroids: Secondary | ICD-10-CM | POA: Diagnosis not present

## 2021-03-21 DIAGNOSIS — I130 Hypertensive heart and chronic kidney disease with heart failure and stage 1 through stage 4 chronic kidney disease, or unspecified chronic kidney disease: Secondary | ICD-10-CM | POA: Diagnosis not present

## 2021-03-21 DIAGNOSIS — I5030 Unspecified diastolic (congestive) heart failure: Secondary | ICD-10-CM | POA: Diagnosis not present

## 2021-03-21 DIAGNOSIS — I2510 Atherosclerotic heart disease of native coronary artery without angina pectoris: Secondary | ICD-10-CM | POA: Diagnosis not present

## 2021-03-21 DIAGNOSIS — G4733 Obstructive sleep apnea (adult) (pediatric): Secondary | ICD-10-CM | POA: Diagnosis not present

## 2021-03-21 DIAGNOSIS — Z471 Aftercare following joint replacement surgery: Secondary | ICD-10-CM | POA: Diagnosis not present

## 2021-03-21 NOTE — Progress Notes (Signed)
[place] [facility])                                            [place] Team    03/21/2021  Philip MEHRING Sr. 1945-09-08 [id number]  FOR 2023 RE ENROLLMENT                                      Medication Assistance Referral  Referral From: [facility] Embedded RPh Dorthula Perfect   Medication/Company: [name] / AZ&ME Patient application portion:  N/A already enrolled for 5320 Provider application portion: Faxed  to Dr. [name] Provider address/fax verified via: Office website   [name] P. [name], [place]  [phone]

## 2021-03-22 ENCOUNTER — Telehealth: Payer: Self-pay | Admitting: Cardiovascular Disease

## 2021-03-22 ENCOUNTER — Telehealth: Payer: Self-pay | Admitting: Pharmacy Technician

## 2021-03-22 DIAGNOSIS — Z596 Low income: Secondary | ICD-10-CM

## 2021-03-22 DIAGNOSIS — Z96651 Presence of right artificial knee joint: Secondary | ICD-10-CM | POA: Diagnosis not present

## 2021-03-22 DIAGNOSIS — M25661 Stiffness of right knee, not elsewhere classified: Secondary | ICD-10-CM | POA: Diagnosis not present

## 2021-03-22 DIAGNOSIS — M25561 Pain in right knee: Secondary | ICD-10-CM | POA: Diagnosis not present

## 2021-03-22 DIAGNOSIS — M6281 Muscle weakness (generalized): Secondary | ICD-10-CM | POA: Diagnosis not present

## 2021-03-22 NOTE — Progress Notes (Signed)
[place] [facility])                                            [place] Team    03/22/2021  Philip [name]. Apr 27, 1946 [id number]  Received provider portion(s) of patient assistance application(s) for Farxiga. Faxed completed application and required documents into AZ&ME for 2023 renewal.    Brookelynn Hamor P. Caitlen Worth, [place]  [phone]

## 2021-03-22 NOTE — Telephone Encounter (Signed)
Patient dropped off PAF placed in box 

## 2021-03-22 NOTE — Telephone Encounter (Signed)
Placed on Dr. Arida's desk to be signed. 

## 2021-03-23 NOTE — Telephone Encounter (Addendum)
Patient assistance application for Eliquis faxed to [employer]. ?Fax confirmation received. ?

## 2021-03-30 DIAGNOSIS — J432 Centrilobular emphysema: Secondary | ICD-10-CM | POA: Diagnosis not present

## 2021-03-30 DIAGNOSIS — E1142 Type 2 diabetes mellitus with diabetic polyneuropathy: Secondary | ICD-10-CM

## 2021-03-30 DIAGNOSIS — I129 Hypertensive chronic kidney disease with stage 1 through stage 4 chronic kidney disease, or unspecified chronic kidney disease: Secondary | ICD-10-CM

## 2021-03-30 DIAGNOSIS — N182 Chronic kidney disease, stage 2 (mild): Secondary | ICD-10-CM | POA: Diagnosis not present

## 2021-04-03 DIAGNOSIS — Z471 Aftercare following joint replacement surgery: Secondary | ICD-10-CM | POA: Diagnosis not present

## 2021-04-15 ENCOUNTER — Ambulatory Visit (INDEPENDENT_AMBULATORY_CARE_PROVIDER_SITE_OTHER): Payer: Medicare Other | Admitting: Pharmacist

## 2021-04-15 DIAGNOSIS — I129 Hypertensive chronic kidney disease with stage 1 through stage 4 chronic kidney disease, or unspecified chronic kidney disease: Secondary | ICD-10-CM

## 2021-04-15 DIAGNOSIS — J432 Centrilobular emphysema: Secondary | ICD-10-CM

## 2021-04-15 DIAGNOSIS — E034 Atrophy of thyroid (acquired): Secondary | ICD-10-CM

## 2021-04-15 DIAGNOSIS — E1142 Type 2 diabetes mellitus with diabetic polyneuropathy: Secondary | ICD-10-CM

## 2021-04-15 NOTE — Chronic Care Management (AMB) (Signed)
Chronic Care Management CCM Pharmacy Note  04/15/2021 Name:  Philip [name] Sr. MRN:  [id number] DOB:  Jan 26, 1946  Subjective: Philip [name] Sr. is an 75 y.o. year old adult who is a primary patient of [name], DO.  The CCM team was consulted for assistance with disease management and care coordination needs.    Engaged with patient by telephone for follow up visit for pharmacy case management and/or care coordination services.   Objective:  Medications Reviewed Today     Reviewed by [name], RPH-CPP (Pharmacist) on 03/16/21 at 1009  Med List Status: <None>   Medication Order Taking? Sig Documenting Provider Last Dose Status Informant  acetaminophen (TYLENOL) 500 MG tablet [id number] Yes Take 1,000-1,500 mg by mouth every 8 (eight) hours as needed for moderate pain or mild pain. [provider] Taking Active Self  Albuterol Sulfate 108 (90 Base) MCG/ACT AEPB [id number] Yes Inhale 2 puffs into the lungs every 6 (six) hours as needed (shortness of breath).  [provider] Taking Active Self  apixaban (ELIQUIS) 5 MG TABS tablet [id number] Yes Take 5 mg by mouth 2 (two) times daily. [provider] Taking Active Self  ARTIFICIAL TEAR SOLUTION OP [id number]  Place 1 drop into both eyes daily. [provider]  Active Self  atorvastatin (LIPITOR) 40 MG tablet [id number]  Take 1 tablet (40 mg total) by mouth daily.  Patient taking differently: Take 40 mg by mouth at bedtime.   [name], MD  Active   Blood Glucose Monitoring Suppl (ONE TOUCH ULTRA 2) w/Device KIT [id number]  Use to check blood sugar as advised up to twice a day [name], DO  Active Self  calcium carbonate (TUMS - DOSED IN MG ELEMENTAL CALCIUM) 500 MG chewable tablet [id number]  Chew 1,000 mg by mouth daily as needed for indigestion or heartburn. [provider]  Active Self  celecoxib (CELEBREX) 200 MG capsule [id number] Yes Take 1  capsule (200 mg total) by mouth 2 (two) times daily. Fausto Skillern, [name] Taking Active   cetirizine (ZYRTEC) 10 MG tablet [id number] Yes Take 10 mg by mouth 2 (two) times daily. [provider] Taking Active Self  EUTHYROX 88 MCG tablet [id number]  Take 1 tablet (88 mcg total) by mouth daily before breakfast. [name], DO  Active Self  FARXIGA 10 MG TABS tablet [id number] Yes Take 1 tablet (10 mg total) by mouth daily before breakfast. [name], DO Taking Active Self  fluticasone (FLONASE) 50 MCG/ACT nasal spray [id number]  Place 1 spray into both nostrils daily as needed for allergies or rhinitis. [provider]  Active Self  furosemide (LASIX) 40 MG tablet [id number] Yes Take 0.5-1 tablets (20-40 mg total) by mouth daily as needed for edema. Only as needed now. [name], DO Taking Active Self  gabapentin (NEURONTIN) 100 MG capsule [id number]  Take 1 capsule (100 mg total) by mouth 3 (three) times daily. [name], [name], DO  Active Self  ipratropium-albuterol (DUONEB) 0.5-2.5 (3) MG/3ML SOLN [id number]  Take 3 mLs by nebulization every 6 (six) hours as needed (shortness of breath). [provider]  Active Self           Med Note Jilda Roche A   Fri Dec 24, 2020  4:04 PM)    Lanell Matar THIN MISC [id number]  1 Device by Does not apply route 4 (four) times daily -  before meals and at bedtime. [name],  MD  Active Self  losartan (COZAAR) 25 MG tablet [id number] Yes Take 0.5 tablets (12.5 mg total) by mouth daily.  Patient taking differently: Take 12.5 mg by mouth every morning.   [name], MD Taking Active   metFORMIN (GLUCOPHAGE) 500 MG tablet [id number] Yes Take 2 tablets (1,000 mg total) by mouth 2 (two) times daily with a meal. [name], [name], DO Taking Active Self  metoprolol tartrate (LOPRESSOR) 50 MG tablet [id number] Yes Take 1.5 tablets (75 mg total) by mouth 2 (two) times daily.  Furth, [name], [name] Taking Active Self  montelukast (SINGULAIR) 10 MG tablet [id number] Yes Take 10 mg by mouth at bedtime. [provider] Taking Active Self  Donald Siva test strip [id number]  USE TO CHECK BLOOD SUGAR TWICE DAILY [name] [name], DO  Active Self  oxyCODONE (OXY IR/ROXICODONE) 5 MG immediate release tablet [id number] Yes Take 1 tablet (5 mg total) by mouth every 4 (four) hours as needed for severe pain. Fausto Skillern, [name] Taking Active   spironolactone (ALDACTONE) 25 MG tablet [id number] Yes Take 0.5 tablets (12.5 mg total) by mouth daily. Kathlen Mody, [name], [name] Taking Active Self  SYMBICORT 160-4.5 MCG/ACT inhaler [id number] Yes Inhale 2 puffs 2 (two) times daily into the lungs.  [provider] Taking Active Self  traMADol (ULTRAM) 50 MG tablet [id number] Yes Take 1 tablet (50 mg total) by mouth every 4 (four) hours as needed for moderate pain. Fausto Skillern, [name] Taking Active             Pertinent Labs:  Lab Results  Component Value Date   HGBA1C 6.8 (A) 01/31/2021   Lab Results  Component Value Date   CHOL 145 11/21/2019   HDL 67 11/21/2019   LDLCALC 58 11/21/2019   TRIG 99 11/21/2019   CHOLHDL 2.2 11/21/2019   Lab Results  Component Value Date   CREATININE 0.92 03/02/2021   BUN 17 03/02/2021   NA 141 03/02/2021   K 4.4 03/02/2021   CL 107 03/02/2021   CO2 26 03/02/2021    SDOH:  (Social Determinants of Health) assessments and interventions performed:    [place]  Review of patient past medical history, allergies, medications, health status, including review of consultants reports, laboratory and other test data, was performed as part of comprehensive evaluation and provision of chronic care management services.   Care Plan : PharmD - Medication Management/Medication Assistance  Updates made by [name], RPH-CPP since 04/15/2021 12:00 AM     Problem: Disease Progression      Long-Range Goal:  Disease Progression Prevented or Minimized   Start Date: 06/16/2020  Expected End Date: 09/14/2020  This Visit's Progress: On track  Recent Progress: On track  Priority: High  Note:   Current Barriers:  Knowledge deficits related to dietary choices to improve blood sugar control Financial - difficulty with affording [name] through Medicare Part D health plan Patient APPROVED for patient assistance for Farxiga from AZ&Me through 04/30/2021 Patient APPROVED for patient assistance for Eliquis from BMS from 01/06/21-04/30/21  Pharmacist Clinical Goal(s):  Over the next 90 days, patient will achieve control of T2DM as evidenced by A1C through collaboration with PharmD and provider.   Interventions: 1:1 collaboration with [name], DO regarding development and update of comprehensive plan of care as evidenced by provider attestation and co-signature Inter-disciplinary care team collaboration (see longitudinal plan of care) Perform chart review Office Visit with [facility] on 12/8 Reports his  pain was worse since wife had fall/surgery and had to reduce doing his own home therapy exercises/stopped going to physical therapy.  Reports now getting back to his home therapy exercises now and pain improving Reports using acetaminophen as needed for control of pain. No longer taking celecoxib and not currently using tramadol Will consider calling to schedule further physical therapy if needed Follow up Visit with Dr. Marry Guan on 12/22  Type 2 Diabetes Controlled; current treatment: metformin 500 mg - 2 tablets (1,000 mg) twice daily as directed Farxiga 10 mg - 1 tablet QAM   Review recent blood sugar results:  Fasting Blood Glucose After Supper/Bedtime  10 - December 130 142  11 - December 116 166  12 - December 107 148  13 - December 100 95  14 - December 90 132  15 - December 126 116  16 - December 118   Average 112 133  Denies s/s of  hypoglycemia  CHF/HTN Current treatment: Losartan 25 mg - 1/2 tablet (12.5 mg) once daily Metoprolol 75 mg twice daily Spironolactone 25 mg - 1/2 tablet (12.5 mg) daily  Farxiga 10 mg - 1 tablets daily Furosemide 40 mg daily as needed as directed by Cardiologist Reports recent BP results:   AM BP PM BP  10 - December 119/90, HR 75 128/82, HR 82  11 - December 132/87, HR 89 123/80, HR 71  12 - December 119/84, HR 64 124/84, HR 80  13 - December 144/96, HR 80 134/85, HR 71  14 - December 142/97, HR 75 128/78, HR 76  15 - December 136/84, HR 74 141/86, HR 71  16 - December 143/86, HR 80   Attributes a couple of days when had higher diastolic readings to times when having more pain Note patient weighs himself daily and follows directions from Cardiologist for use of PRN furosemide (Lasix).  Counseled patient on importance of contacting Cardiologist for BP and HR readings outside of established parameters  COPD/seasonal allergies: Patient follows with Dr. Raul Del at [facility]ology Current treatment: Symbicort 160/4.5 two puffs bid Montelukast 10 mg QHS Albuterol/ipratropium nebulizer solution/ albuterol inhaler as needed Cetirizine 10 mg daily Flonase nasal spray as needed Using saline Navage pods Reports using maintenance (Symbicort) inhaler twice daily consistently and rinsing mouth out after each use and using rescue inhaler (albuterol) as needed Reports recently respiratory symptoms have been improved  Medication Assistance: Patient to be auto re-enrolled in patient assistance for [place] with AZ&Me for 2023 calendar year No action needed by patient at this time Collaborating with [place] [facility] regarding provider portion of renewal/Rx to patient assistance program - faxed to program on 11/22  Medication/Device Adherence Note patient uses weekly pillbox to organize medications   Patient Goals/Self-Care Activities Over the next 90 days, patient will:   Self administers medications as prescribed Attends all scheduled provider appointments Calls pharmacy for medication refills Calls provider office for new concerns or questions  Patient to check blood sugar regularly as directed and keep log Patient to keep log when checks blood pressure  Follow Up Plan: Telephone follow up appointment with care management team member scheduled for: 05/18/2021 at 2:30 pm       Wallace Cullens, PharmD, [place], CPP Clinical Pharmacist [place] [phone]

## 2021-04-15 NOTE — Patient Instructions (Signed)
Visit Information  Thank you for taking time to visit with me today. Please don't hesitate to contact me if I can be of assistance to you before our next scheduled telephone appointment.  Following are the goals we discussed today:   Goals Addressed             This Visit's Progress    Pharmacy Goals       Our goal A1c is less than 7%. This corresponds with fasting sugars less than 130 and 2 hour after meal sugars less than 180. Please check your blood sugar and keep log of results  Please continue to monitor your home weight, blood pressure and heart rate  Our goal bad cholesterol, or LDL, is less than 70 . This is why it is important to continue taking your atorvastatin  Feel free to call me with any questions or concerns. I look forward to our next call!   Wallace Cullens, PharmD, [place] [phone]          Our next appointment is by telephone on 05/18/2021 at 2:30 pm  Please call the care guide team at [phone] if you need to cancel or reschedule your appointment.    The patient verbalized understanding of instructions, educational materials, and care plan provided today and declined offer to receive copy of patient instructions, educational materials, and care plan.

## 2021-04-18 ENCOUNTER — Other Ambulatory Visit: Payer: Self-pay | Admitting: Cardiovascular Disease

## 2021-04-20 ENCOUNTER — Telehealth: Payer: Self-pay | Admitting: Pharmacy Technician

## 2021-04-20 DIAGNOSIS — Z596 Low income: Secondary | ICD-10-CM

## 2021-04-20 NOTE — Progress Notes (Signed)
[facility])                                            [place] Team    04/20/2021  Philip FRETT Sr. October 24, 1945 [id number]  Care coordination call placed to [employer] in regard to [facility] application.  Spoke to [place] who informed patient was APPROVED 05/01/21-04/30/22. She confirmed they have an updated rx on file. Patient is aware that he will need to call in for his first refill in 2023 and patient confirmed having the phone number to call.  Lino Wickliff P. [place] Pfund, Philip [name]  [phone]

## 2021-04-21 DIAGNOSIS — Z96651 Presence of right artificial knee joint: Secondary | ICD-10-CM | POA: Diagnosis not present

## 2021-04-22 ENCOUNTER — Other Ambulatory Visit: Payer: Self-pay | Admitting: Cardiovascular Disease

## 2021-04-22 ENCOUNTER — Other Ambulatory Visit: Payer: Self-pay | Admitting: Family Medicine

## 2021-04-22 DIAGNOSIS — E1142 Type 2 diabetes mellitus with diabetic polyneuropathy: Secondary | ICD-10-CM

## 2021-04-22 NOTE — Telephone Encounter (Signed)
Requested Prescriptions  Pending Prescriptions Disp Refills   ONETOUCH ULTRA test strip [Pharmacy Med Name: [place] In Vitro Strip] 150 each 0    Sig: USE TO [place] DAILY     Endocrinology: Diabetes - Testing Supplies Passed - 04/22/2021  6:47 PM      Passed - Valid encounter within last 12 months    Recent Outpatient Visits          2 months ago Type 2 diabetes mellitus with peripheral neuropathy [facility])   [facility], [name], DO   10 months ago Type 2 diabetes mellitus with hyperlipidemia [facility])   [place], DO   11 months ago Acute idiopathic gout of right ankle   [place], DO   1 year ago Acute conjunctivitis of both eyes, unspecified acute conjunctivitis type   [place], DO   1 year ago Chronic right hip pain   [place], DO      Future Appointments            In 1 month Fletcher Anon, Mertie Clause, MD [facility], [place]   In 3 months [name], [name], [facility], [place]   In 3 months  [facility], [facility]

## 2021-04-29 ENCOUNTER — Other Ambulatory Visit: Payer: Self-pay | Admitting: Medical

## 2021-04-30 DIAGNOSIS — I129 Hypertensive chronic kidney disease with stage 1 through stage 4 chronic kidney disease, or unspecified chronic kidney disease: Secondary | ICD-10-CM

## 2021-04-30 DIAGNOSIS — N182 Chronic kidney disease, stage 2 (mild): Secondary | ICD-10-CM | POA: Diagnosis not present

## 2021-04-30 DIAGNOSIS — J432 Centrilobular emphysema: Secondary | ICD-10-CM

## 2021-04-30 DIAGNOSIS — E034 Atrophy of thyroid (acquired): Secondary | ICD-10-CM

## 2021-04-30 DIAGNOSIS — E1142 Type 2 diabetes mellitus with diabetic polyneuropathy: Secondary | ICD-10-CM

## 2021-05-18 ENCOUNTER — Ambulatory Visit (INDEPENDENT_AMBULATORY_CARE_PROVIDER_SITE_OTHER): Payer: Medicare Other | Admitting: Pharmacist

## 2021-05-18 DIAGNOSIS — E1142 Type 2 diabetes mellitus with diabetic polyneuropathy: Secondary | ICD-10-CM

## 2021-05-18 NOTE — Patient Instructions (Signed)
Visit Information  Thank you for taking time to visit with me today. Please don't hesitate to contact me if I can be of assistance to you before our next scheduled telephone appointment.  Following are the goals we discussed today:   Goals Addressed             This Visit's Progress    Pharmacy Goals       Our goal A1c is less than 7%. This corresponds with fasting sugars less than 130 and 2 hour after meal sugars less than 180. Please check your blood sugar and keep log of results  Please continue to monitor your home weight, blood pressure and heart rate  Our goal bad cholesterol, or LDL, is less than 70 . This is why it is important to continue taking your atorvastatin  Feel free to call me with any questions or concerns. I look forward to our next call!  Wallace Cullens, PharmD, [place] [phone]          Our next appointment is by telephone on 06/01/2021 at 9:15 am  Please call the care guide team at [phone] if you need to cancel or reschedule your appointment.    The patient verbalized understanding of instructions, educational materials, and care plan provided today and declined offer to receive copy of patient instructions, educational materials, and care plan.

## 2021-05-18 NOTE — Chronic Care Management (AMB) (Signed)
Chronic Care Management CCM Pharmacy Note  05/18/2021 Name:  Philip ROBIDEAU Sr. MRN:  [id number] DOB:  [birth year]   Subjective: Philip Potters Sr. is an 76 y.o. year old adult who is a primary patient of [name], DO.  The CCM team was consulted for assistance with disease management and care coordination needs.    Engaged with patient by telephone for follow up visit for pharmacy case management and/or care coordination services.   Objective:  Medications    Reviewed by [name], RPH-CPP (Pharmacist) on 04/15/21 at 1035  Med List Status: <None>   Medication Order Taking? Sig Documenting Provider Last Dose Status Informant  acetaminophen (TYLENOL) 500 MG tablet [id number] Yes Take 1,000 mg by mouth every 8 (eight) hours as needed for moderate pain or mild pain. [provider] Taking Active Self  Albuterol Sulfate 108 (90 Base) MCG/ACT AEPB [id number] Yes Inhale 2 puffs into the lungs every 6 (six) hours as needed (shortness of breath).  [provider] Taking Active Self  apixaban (ELIQUIS) 5 MG TABS tablet [id number] Yes Take 5 mg by mouth 2 (two) times daily. [provider] Taking Active Self  ARTIFICIAL TEAR SOLUTION OP [id number]  Place 1 drop into both eyes daily. [provider]  Active Self  atorvastatin (LIPITOR) 40 MG tablet [id number] Yes Take 1 tablet (40 mg total) by mouth daily.  Patient taking differently: Take 40 mg by mouth at bedtime.   [name], MD Taking Active   Blood Glucose Monitoring Suppl (ONE TOUCH ULTRA 2) w/Device KIT [id number]  Use to check blood sugar as advised up to twice a day [name], DO  Active Self  calcium carbonate (TUMS - DOSED IN MG ELEMENTAL CALCIUM) 500 MG chewable tablet [id number]  Chew 1,000 mg by mouth daily as needed for indigestion or heartburn. [provider]  Active Self  cetirizine (ZYRTEC) 10 MG tablet [id number] Yes Take 10 mg by mouth 2 (two)  times daily. [provider] Taking Active Self  EUTHYROX 88 MCG tablet [id number] Yes Take 1 tablet (88 mcg total) by mouth daily before breakfast. [name], DO Taking Active Self  FARXIGA 10 MG TABS tablet [id number] Yes Take 1 tablet (10 mg total) by mouth daily before breakfast. [name], DO Taking Active Self  fluticasone (FLONASE) 50 MCG/ACT nasal spray [id number]  Place 1 spray into both nostrils daily as needed for allergies or rhinitis. [provider]  Active Self  furosemide (LASIX) 40 MG tablet [id number] Yes Take 0.5-1 tablets (20-40 mg total) by mouth daily as needed for edema. Only as needed now. [name], DO Taking Active Self  gabapentin (NEURONTIN) 100 MG capsule [id number]  Take 1 capsule (100 mg total) by mouth 3 (three) times daily. [name], [name], DO  Active Self  ipratropium-albuterol (DUONEB) 0.5-2.5 (3) MG/3ML SOLN [id number]  Take 3 mLs by nebulization every 6 (six) hours as needed (shortness of breath). [provider]  Active Self           Med Note Jilda Roche A   Fri Dec 24, 2020  4:04 PM)    Lanell Matar THIN MISC [id number]  1 Device by Does not apply route 4 (four) times daily -  before meals and at bedtime. [name], MD  Active Self  losartan (COZAAR) 25 MG tablet [id number] Yes Take 0.5 tablets (12.5 mg total) by mouth daily.  Patient taking differently: Take 12.5 mg by  mouth every morning.   [name], MD Taking Active   metFORMIN (GLUCOPHAGE) 500 MG tablet [id number] Yes Take 2 tablets (1,000 mg total) by mouth 2 (two) times daily with a meal. [name], [name], DO Taking Active Self  metoprolol tartrate (LOPRESSOR) 50 MG tablet [id number]  Take 1.5 tablets (75 mg total) by mouth 2 (two) times daily. Furth, [name], [name]  Active Self  montelukast (SINGULAIR) 10 MG tablet [id number] Yes Take 10 mg by mouth at bedtime. [provider] Taking Active Self   Donald Siva test strip [id number]  USE TO CHECK BLOOD SUGAR TWICE DAILY [name] [name], DO  Active Self  spironolactone (ALDACTONE) 25 MG tablet [id number] Yes Take 0.5 tablets (12.5 mg total) by mouth daily. Kathlen Mody, [name], [name] Taking Active Self  SYMBICORT 160-4.5 MCG/ACT inhaler [id number] Yes Inhale 2 puffs 2 (two) times daily into the lungs.  [provider] Taking Active Self  traMADol (ULTRAM) 50 MG tablet [id number] No Take 1 tablet (50 mg total) by mouth every 4 (four) hours as needed for moderate pain.  Patient not taking: Reported on 04/15/2021   [name] Not Taking Active             Pertinent Labs:  Lab Results  Component Value Date   HGBA1C 6.8 (A) 01/31/2021   Lab Results  Component Value Date   CHOL 145 11/21/2019   HDL 67 11/21/2019   LDLCALC 58 11/21/2019   TRIG 99 11/21/2019   CHOLHDL 2.2 11/21/2019   Lab Results  Component Value Date   CREATININE 0.92 03/02/2021   BUN 17 03/02/2021   NA 141 03/02/2021   K 4.4 03/02/2021   CL 107 03/02/2021   CO2 26 03/02/2021    SDOH:  (Social Determinants of Health) assessments and interventions performed:    [place]  Review of patient past medical history, allergies, medications, health status, including review of consultants reports, laboratory and other test data, was performed as part of comprehensive evaluation and provision of chronic care management services.   Care Plan : PharmD - Medication Management/Medication Assistance  Updates made by [name], RPH-CPP since 05/18/2021 12:00 AM     Problem: Disease Progression      Long-Range Goal: Disease Progression Prevented or Minimized   Start Date: 06/16/2020  Expected End Date: 09/14/2020  Recent Progress: On track  Priority: High  Note:   Current Barriers:  Knowledge deficits related to dietary choices to improve blood sugar control Financial - difficulty with affording [name] through Medicare Part  D health plan Patient APPROVED for patient assistance for Farxiga from [employer] through 04/30/2022 Patient APPROVED for patient assistance for Eliquis from [employer] from 01/06/21-04/30/21  Pharmacist Clinical Goal(s):  Over the next 90 days, patient will achieve control of T2DM as evidenced by A1C through collaboration with PharmD and provider.   Interventions: 1:1 collaboration with [name], DO regarding development and update of comprehensive plan of care as evidenced by provider attestation and co-signature Inter-disciplinary care team collaboration (see longitudinal plan of care) Today patient requests to receive new copy of [facility] logbook/calendar for recording his BP, blood sugar and daily weight results Will collaborate with [place] team to request a copy be mailed to patient. Patient requests to reschedule follow up regarding T2DM, CHF/HTN and COPD as he is currently not home. Rescheduled as requested   Medication Assistance: Collaborated with [place] [employer] regarding provider portion of renewal/Rx to patient assistance  program - faxed to program on 11/22 Receive message from [place] [employer] advising patient was APPROVED 05/01/21-04/30/22 for re-enrollment to receive Farxiga from [employer] patient assistance program Follow up with patient to let him know of approval and to expect next shipment to come based on when he received last supply in 2022 calendar year Patient states that he has plenty of Farxiga at this time  Medication/Device Adherence Note patient uses weekly pillbox to organize medications   Patient Goals/Self-Care Activities Over the next 90 days, patient will:  Self administers medications as prescribed Attends all scheduled provider appointments Calls pharmacy for medication refills Calls provider office for new concerns or questions  Patient to check blood sugar regularly as directed and keep log Patient to keep log when checks blood  pressure  Follow Up Plan: Telephone follow up appointment with care management team member scheduled for: 06/01/2021 at 9:15 am       Wallace Cullens, PharmD, [place], [facility] [phone]

## 2021-05-31 DIAGNOSIS — E1142 Type 2 diabetes mellitus with diabetic polyneuropathy: Secondary | ICD-10-CM

## 2021-06-01 ENCOUNTER — Ambulatory Visit (INDEPENDENT_AMBULATORY_CARE_PROVIDER_SITE_OTHER): Payer: Medicare Other | Admitting: Pharmacist

## 2021-06-01 DIAGNOSIS — N182 Chronic kidney disease, stage 2 (mild): Secondary | ICD-10-CM

## 2021-06-01 DIAGNOSIS — I129 Hypertensive chronic kidney disease with stage 1 through stage 4 chronic kidney disease, or unspecified chronic kidney disease: Secondary | ICD-10-CM

## 2021-06-01 DIAGNOSIS — E1142 Type 2 diabetes mellitus with diabetic polyneuropathy: Secondary | ICD-10-CM

## 2021-06-01 DIAGNOSIS — J432 Centrilobular emphysema: Secondary | ICD-10-CM

## 2021-06-01 NOTE — Chronic Care Management (AMB) (Signed)
Chronic Care Management CCM Pharmacy Note  06/01/2021 Name:  Philip PLOTTS Sr. MRN:  [id number] DOB:  August 06, 1945   Subjective: Philip Potters Sr. is an 76 y.o. year old adult who is a primary patient of [name], DO.  The CCM team was consulted for assistance with disease management and care coordination needs.    Engaged with patient by telephone for follow up visit for pharmacy case management and/or care coordination services.   Objective:  Medications Reviewed Today     Reviewed by [name], RPH-CPP (Pharmacist) on 06/01/21 at 27  Med List Status: <None>   Medication Order Taking? Sig Documenting Provider Last Dose Status Informant  acetaminophen (TYLENOL) 500 MG tablet [id number]  Take 1,000 mg by mouth every 8 (eight) hours as needed for moderate pain or mild pain. [provider]  Active Self  Albuterol Sulfate 108 (90 Base) MCG/ACT AEPB [id number]  Inhale 2 puffs into the lungs every 6 (six) hours as needed (shortness of breath).  [provider]  Active Self  apixaban (ELIQUIS) 5 MG TABS tablet [id number]  Take 5 mg by mouth 2 (two) times daily. [provider]  Active Self  ARTIFICIAL TEAR SOLUTION OP [id number]  Place 1 drop into both eyes daily. [provider]  Active Self  atorvastatin (LIPITOR) 40 MG tablet [id number]  Take 1 tablet by mouth once daily [name], MD  Active   Blood Glucose Monitoring Suppl (ONE TOUCH ULTRA 2) w/Device KIT [id number]  Use to check blood sugar as advised up to twice a day [name], DO  Active Self  calcium carbonate (TUMS - DOSED IN MG ELEMENTAL CALCIUM) 500 MG chewable tablet [id number]  Chew 1,000 mg by mouth daily as needed for indigestion or heartburn. [provider]  Active Self  cetirizine (ZYRTEC) 10 MG tablet [id number] Yes Take 10 mg by mouth 2 (two) times daily. [provider] Taking Active Self  EUTHYROX 88 MCG tablet [id number]   Take 1 tablet (88 mcg total) by mouth daily before breakfast. [name], DO  Active Self  FARXIGA 10 MG TABS tablet [id number] Yes Take 1 tablet (10 mg total) by mouth daily before breakfast. [name], DO Taking Active Self  fluticasone (FLONASE) 50 MCG/ACT nasal spray [id number]  Place 1 spray into both nostrils daily as needed for allergies or rhinitis. [provider]  Active Self  furosemide (LASIX) 40 MG tablet [id number]  Take 0.5-1 tablets (20-40 mg total) by mouth daily as needed for edema. Only as needed now. [name], DO  Active Self  gabapentin (NEURONTIN) 100 MG capsule [id number]  Take 1 capsule (100 mg total) by mouth 3 (three) times daily. [name], [name], DO  Active Self  ipratropium-albuterol (DUONEB) 0.5-2.5 (3) MG/3ML SOLN [id number]  Take 3 mLs by nebulization every 6 (six) hours as needed (shortness of breath). [provider]  Active Self           Med Note Jilda Roche A   Fri Dec 24, 2020  4:04 PM)    Lanell Matar THIN MISC [id number]  1 Device by Does not apply route 4 (four) times daily -  before meals and at bedtime. [name], MD  Active Self  losartan (COZAAR) 25 MG tablet [id number] Yes Take 0.5 tablets (12.5 mg total) by mouth every morning. [name], MD Taking Active   metFORMIN (GLUCOPHAGE) 500 MG tablet [id number] Yes Take 2 tablets (1,000  mg total) by mouth 2 (two) times daily with a meal. [name], [name], DO Taking Active Self  metoprolol tartrate (LOPRESSOR) 50 MG tablet [id number] Yes TAKE 1 & 1/2 (ONE & ONE-HALF) TABLETS BY MOUTH TWICE DAILY Furth, [name], [name] Taking Active   montelukast (SINGULAIR) 10 MG tablet [id number] Yes Take 10 mg by mouth at bedtime. [provider] Taking Active Self  Donald Siva test strip [id number]  USE TO CHECK BLOOD SUGAR TWICE DAILY [name], DO  Active   spironolactone (ALDACTONE) 25 MG tablet [id number] Yes  Take 0.5 tablets (12.5 mg total) by mouth daily. Kathlen Mody, [name], [name] Taking Active Self  SYMBICORT 160-4.5 MCG/ACT inhaler [id number] Yes Inhale 2 puffs 2 (two) times daily into the lungs.  [provider] Taking Active Self  traMADol (ULTRAM) 50 MG tablet [id number]  Take 1 tablet (50 mg total) by mouth every 4 (four) hours as needed for moderate pain.  Patient not taking: Reported on 04/15/2021   [name]  Active             Pertinent Labs:  Lab Results  Component Value Date   HGBA1C 6.8 (A) 01/31/2021   Lab Results  Component Value Date   CHOL 145 11/21/2019   HDL 67 11/21/2019   LDLCALC 58 11/21/2019   TRIG 99 11/21/2019   CHOLHDL 2.2 11/21/2019   Lab Results  Component Value Date   CREATININE 0.92 03/02/2021   BUN 17 03/02/2021   NA 141 03/02/2021   K 4.4 03/02/2021   CL 107 03/02/2021   CO2 26 03/02/2021   BP Readings from Last 3 Encounters:  03/11/21 110/70  03/08/21 114/66  03/02/21 115/72   Pulse Readings from Last 3 Encounters:  03/11/21 78  03/08/21 87  03/02/21 68     SDOH:  (Social Determinants of Health) assessments and interventions performed:    [place]  Review of patient past medical history, allergies, medications, health status, including review of consultants reports, laboratory and other test data, was performed as part of comprehensive evaluation and provision of chronic care management services.   Care Plan : PharmD - Medication Management/Medication Assistance  Updates made by [name], RPH-CPP since 06/01/2021 12:00 AM     Problem: Disease Progression      Long-Range Goal: Disease Progression Prevented or Minimized   Start Date: 06/16/2020  Expected End Date: 09/14/2020  This Visit's Progress: On track  Recent Progress: On track  Priority: High  Note:   Current Barriers:  Knowledge deficits related to dietary choices to improve blood sugar control Financial - difficulty with affording  [name] through Medicare Part D health plan Patient APPROVED for patient assistance for Farxiga from [employer] through 04/30/2022 Patient APPROVED for patient assistance for Eliquis from [employer] from 01/06/21-04/30/21  Pharmacist Clinical Goal(s):  Over the next 90 days, patient will achieve control of T2DM as evidenced by A1C through collaboration with PharmD and provider.   Interventions: 1:1 collaboration with [name], DO regarding development and update of comprehensive plan of care as evidenced by provider attestation and co-signature Inter-disciplinary care team collaboration (see longitudinal plan of care) Reports knee continues to heal well since surgery  Type 2 Diabetes Controlled; current treatment: metformin 500 mg - 2 tablets (1,000 mg) twice daily as directed Farxiga 10 mg - 1 tablet QAM   Reports has not been monitoring blood sugar as closely over past week as he was traveling Reports most recent blood sugar  results: 1/26: morning fasting: 139 1/25: morning fasting: 126 1/24: morning fasting: 102; after supper: 182 1/23: morning fasting: 118 Denies s/s of hypoglycemia Reports used weekly pillbox while traveling, but notes did miss one evening dose of metformin over past week   CHF/HTN Current treatment: Losartan 25 mg - 1/2 tablet (12.5 mg) once daily Metoprolol 75 mg twice daily Spironolactone 25 mg - 1/2 tablet (12.5 mg) daily  Farxiga 10 mg - 1 tablets daily Furosemide 40 mg daily as needed as directed by Cardiologist Reports last checked BP: 1/26: 115/80, HR 79 Note patient weighs himself daily and follows directions from Cardiologist for use of PRN furosemide (Lasix).  Counseled patient on importance of contacting Cardiologist for BP and HR readings outside of established parameters   COPD/seasonal allergies: Patient follows with Dr. Raul Del at [facility] Pulmonology Current treatment: Symbicort 160/4.5 two puffs bid Montelukast 10 mg  QHS Albuterol/ipratropium nebulizer solution/ albuterol inhaler as needed Cetirizine 10 mg daily Flonase nasal spray as needed Using saline Navage pods Reports using maintenance (Symbicort) inhaler twice daily consistently and rinsing mouth out after each use and using rescue inhaler (albuterol) as needed Reports recently respiratory symptoms have been improved Identifies in need of refill of Symbicort. Patient states will call the pharmacy today Follow up with patient regarding sleep apnea/CPAP Denies restarting CPAP Again encourage patient to consider restart as recommended by Pulmonologist Discuss considering contacting DME company if needed for additional support  Medication Assistance: Collaborated with [place] [employer] regarding provider portion of renewal/Rx to patient assistance program  Patient was APPROVED 05/01/21-04/30/22 for re-enrollment to receive Farxiga from [place] patient assistance program Patient states that he has plenty of Farxiga at this time  Medication/Device Adherence Note patient uses weekly pillbox to organize medications   Patient Goals/Self-Care Activities Over the next 90 days, patient will:  Self administers medications as prescribed Attends all scheduled provider appointments Calls pharmacy for medication refills Calls provider office for new concerns or questions  Patient to check blood sugar regularly as directed and keep log Patient to keep log when checks blood pressure  Follow Up Plan: Telephone follow up appointment with care management team member scheduled for: 06/29/2021 at 9:15 am       Wallace Cullens, PharmD, [place], [place] [phone]

## 2021-06-01 NOTE — Patient Instructions (Signed)
Visit Information  Thank you for taking time to visit with me today. Please don't hesitate to contact me if I can be of assistance to you before our next scheduled telephone appointment.  Following are the goals we discussed today:   Goals Addressed             This Visit's Progress    Pharmacy Goals       Our goal A1c is less than 7%. This corresponds with fasting sugars less than 130 and 2 hour after meal sugars less than 180. Please check your blood sugar and keep log of results  Please continue to monitor your home weight, blood pressure and heart rate  Our goal bad cholesterol, or LDL, is less than 70 . This is why it is important to continue taking your atorvastatin  Feel free to call me with any questions or concerns. I look forward to our next call!   Wallace Cullens, PharmD, [place] [phone]          Our next appointment is by telephone on 06/29/2021 at 9:15 am  Please call the care guide team at [phone] if you need to cancel or reschedule your appointment.    The patient verbalized understanding of instructions, educational materials, and care plan provided today and declined offer to receive copy of patient instructions, educational materials, and care plan.

## 2021-06-09 ENCOUNTER — Encounter: Payer: Self-pay | Admitting: Cardiovascular Disease

## 2021-06-09 ENCOUNTER — Ambulatory Visit: Payer: Medicare Other | Admitting: Cardiovascular Disease

## 2021-06-09 ENCOUNTER — Other Ambulatory Visit: Payer: Self-pay

## 2021-06-09 VITALS — BP 110/70 | HR 66 | Ht 71.5 in | Wt 192.4 lb

## 2021-06-09 DIAGNOSIS — I5022 Chronic systolic (congestive) heart failure: Secondary | ICD-10-CM

## 2021-06-09 DIAGNOSIS — E785 Hyperlipidemia, unspecified: Secondary | ICD-10-CM

## 2021-06-09 DIAGNOSIS — I482 Chronic atrial fibrillation, unspecified: Secondary | ICD-10-CM | POA: Diagnosis not present

## 2021-06-09 DIAGNOSIS — I2510 Atherosclerotic heart disease of native coronary artery without angina pectoris: Secondary | ICD-10-CM

## 2021-06-09 MED ORDER — LOSARTAN POTASSIUM 25 MG PO TABS: 25.0000 mg | ORAL_TABLET | ORAL | 3 refills | Status: DC

## 2021-06-09 NOTE — Progress Notes (Signed)
Cardiology Office Note   Date:  06/09/2021   ID:  Philip BOEDECKER Sr., DOB [birth year], MRN [id number]  PCP:  [name], DO  Cardiologist:   [name], MD   Chief Complaint  Patient presents with   Other    3 month f/u no complaints today. Meds reviewed verbally with pt.      History of Present Illness: Philip Hohensee. is a 76 y.o. adult who presents for a follow-up visit regarding coronary artery disease, chronic atrial fibrillation and atrial tachycardia.  Philip [name] is status post CABG in January of 2014.  Philip [name] had postoperative atrial fibrillation which was treated with amiodarone.    Philip [name] was hospitalized in February 2021 with sepsis secondary to pneumonia and MSSA bacteremia complicated by atrial flutter with RVR.  Echo showed an EF of 40 to 45% with global hypokinesis, mildly reduced RV function, moderate pulmonary hypertension mild to moderate mitral regurgitation.  TEE showed an EF of 30 to 35% with no evidence of vegetations.  Philip [name] had a Lexiscan Myoview in March of 2021which showed no evidence of ischemia .  Most recent echocardiogram in October 2022 showed an EF of 40 to 45% with normal LV function.  The patient had back surgery in September and most recently right knee replacement in November.  Philip [name] has been doing reasonably well and denies any chest pain, shortness of breath or palpitations.  No dizziness or syncope.    Past Medical History:  Diagnosis Date   (HFpEF) heart failure with preserved ejection fraction ([place])    a. 06/2015 Echo: EF 50-55%, Gr1 DD. Mild MR. Mildly dil LA. Nl RV fxn. Nl PASP.   Arthritis    a. right knee   Asthma    Atrial tachycardia (HCC)    Chronic anticoagulation    Apixaban   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 etiology.   COPD (chronic obstructive pulmonary disease) (HCC)    Coronary artery disease    a. 05/2012 Cath: severe 3VD-->CABG x 3 by Dr. Servando Snare in 01/07/2014[place], SVG->LCX & SVG->RPDA; b. 06/2014 MV: no  ischemia/infarct.   DDD (degenerative disc disease), lumbar    Dyspnea    Edema    LEGS/ FEET   Essential hypertension    History of Helicobacter pylori infection    HNP (herniated nucleus pulposus), lumbar    HOH (hard of hearing)    Hypercholesteremia    Hypothyroidism    Low back pain radiating to lower extremity    Lumbar radiculitis    Lumbar stenosis with neurogenic claudication    Neuropathy    PAF (paroxysmal atrial fibrillation) (HCC)    a. CHA2DS2VASc = 5 (age, CHF, HTN, vascular disase, T2DM) --> Eliquis.   Sepsis ([place]) 2021   Shingles    Sleep apnea    NO CPAP   Type II diabetes mellitus [facility])     Past Surgical History:  Procedure Laterality Date   ANTERIOR CERVICAL DECOMP/DISCECTOMY FUSION  ~ 2009   CARDIAC CATHETERIZATION  05/06/2012   Significant three-vessel coronary artery disease with normal ejection fraction   CATARACT EXTRACTION W/PHACO Right 08/15/2017   Procedure: CATARACT EXTRACTION PHACO AND INTRAOCULAR LENS PLACEMENT ([place]);  Surgeon: Birder Robson, MD;  Location: [facility];  Service: Ophthalmology;  Laterality: Right;  [place] 00:23 AP% 13.3 CDE 3.11 Fluid pack lot # [id number]   CATARACT EXTRACTION W/PHACO Left 10/03/2017   Procedure: CATARACT EXTRACTION PHACO AND INTRAOCULAR LENS PLACEMENT ([facility]);  Surgeon:  Birder Robson, MD;  Location: [facility];  Service: Ophthalmology;  Laterality: Left;  Lot #[id number] H [place]: 00:21.2 AP%:13.9 CDE: 2.96   CORONARY ARTERY BYPASS GRAFT  05/07/2012   Procedure: CORONARY ARTERY BYPASS GRAFTING (CABG);  Surgeon: [name], MD;  Location: [place];  Service: Open Heart Surgery;  Laterality: N/A;  times three   INTRAOPERATIVE TRANSESOPHAGEAL ECHOCARDIOGRAM  05/07/2012   Procedure: INTRAOPERATIVE TRANSESOPHAGEAL ECHOCARDIOGRAM;  Surgeon: [name], MD;  Location: [place];  Service: Open Heart Surgery;  Laterality: N/A;   KNEE ARTHROPLASTY Right 03/07/2021   Procedure: COMPUTER ASSISTED TOTAL RIGHT KNEE  ARTHROPLASTY -;  Surgeon: Dereck Leep, MD;  Location: [facility];  Service: Orthopedics;  Laterality: Right;   KNEE ARTHROSCOPY Right ~ 2000   KNEE SURGERY Right 2003   LUMBAR LAMINECTOMY/DECOMPRESSION MICRODISCECTOMY Right 01/07/2021   Procedure: RIGHT L3-4 MICRODISCECTOMY 1 LEVEL;  Surgeon: Meade Maw, MD;  Location: [facility];  Service: Neurosurgery;  Laterality: Right;   TEE WITHOUT CARDIOVERSION N/A 07/04/2019   Procedure: TRANSESOPHAGEAL ECHOCARDIOGRAM (TEE);  Surgeon: Nelva Bush, MD;  Location: [facility];  Service: Cardiovascular;  Laterality: N/A;     Current Outpatient Medications  Medication Sig Dispense Refill   acetaminophen (TYLENOL) 500 MG tablet Take 1,000 mg by mouth every 8 (eight) hours as needed for moderate pain or mild pain.     Albuterol Sulfate 108 (90 Base) MCG/ACT AEPB Inhale 2 puffs into the lungs every 6 (six) hours as needed (shortness of breath).      apixaban (ELIQUIS) 5 MG TABS tablet Take 5 mg by mouth 2 (two) times daily.     ARTIFICIAL TEAR SOLUTION OP Place 1 drop into both eyes daily.     atorvastatin (LIPITOR) 40 MG tablet Take 1 tablet by mouth once daily 90 tablet 0   Blood Glucose Monitoring Suppl (ONE TOUCH ULTRA 2) w/Device KIT Use to check blood sugar as advised up to twice a day 1 kit 0   calcium carbonate (TUMS - DOSED IN MG ELEMENTAL CALCIUM) 500 MG chewable tablet Chew 1,000 mg by mouth daily as needed for indigestion or heartburn.     cetirizine (ZYRTEC) 10 MG tablet Take 10 mg by mouth 2 (two) times daily.     EUTHYROX 88 MCG tablet Take 1 tablet (88 mcg total) by mouth daily before breakfast. 90 tablet 3   FARXIGA 10 MG TABS tablet Take 1 tablet (10 mg total) by mouth daily before breakfast. 90 tablet 3   fluticasone (FLONASE) 50 MCG/ACT nasal spray Place 1 spray into both nostrils daily as needed for allergies or rhinitis.     furosemide (LASIX) 40 MG tablet Take 0.5-1 tablets (20-40 mg total) by mouth daily as needed for edema.  Only as needed now. 45 tablet 3   gabapentin (NEURONTIN) 100 MG capsule Take 1 capsule (100 mg total) by mouth 3 (three) times daily. 270 capsule 3   ipratropium-albuterol (DUONEB) 0.5-2.5 (3) MG/3ML SOLN Take 3 mLs by nebulization every 6 (six) hours as needed (shortness of breath).     LANCETS ULTRA THIN MISC 1 Device by Does not apply route 4 (four) times daily -  before meals and at bedtime. 200 each 5   losartan (COZAAR) 25 MG tablet Take 0.5 tablets (12.5 mg total) by mouth every morning. 45 tablet 1   metFORMIN (GLUCOPHAGE) 500 MG tablet Take 2 tablets (1,000 mg total) by mouth 2 (two) times daily with a meal. 360 tablet 3   metoprolol tartrate (LOPRESSOR) 50 MG  tablet TAKE 1 & 1/2 (ONE & ONE-HALF) TABLETS BY MOUTH TWICE DAILY 270 tablet 0   montelukast (SINGULAIR) 10 MG tablet Take 10 mg by mouth at bedtime.     ONETOUCH ULTRA test strip USE TO CHECK BLOOD SUGAR TWICE DAILY 100 each 1   spironolactone (ALDACTONE) 25 MG tablet Take 0.5 tablets (12.5 mg total) by mouth daily. 45 tablet 3   SYMBICORT 160-4.5 MCG/ACT inhaler Inhale 2 puffs 2 (two) times daily into the lungs.      traMADol (ULTRAM) 50 MG tablet Take 1 tablet (50 mg total) by mouth every 4 (four) hours as needed for moderate pain. 30 tablet 0   No current facility-administered medications for this visit.    Allergies:   Bydureon [exenatide], Clindamycin/lincomycin, Penicillins, Sulfa antibiotics, Tiotropium, Penicillin g, Spiriferis, Spiriva handihaler [tiotropium bromide monohydrate], Iodinated contrast media, and Other    Social History:  The patient  reports that Philip [name] quit smoking about 53 years ago. His smoking use included cigarettes. Philip [name] has a 8.00 pack-year smoking history. Philip [name] has quit using smokeless tobacco. Philip [name] reports that Philip [name] does not drink alcohol and does not use drugs.   Family History:  The patient's family history includes Clotting disorder in his mother; Heart disease in his mother; Hypertension in his sister.     ROS:  Please see the history of present illness.   Otherwise, review of systems are positive for none.   All other systems are reviewed and negative.    PHYSICAL EXAM: VS:  BP 110/70 (BP Location: Left Arm, Patient Position: Sitting, Cuff Size: Normal)    Pulse 66    Ht 5' 11.5" (1.816 m)    Wt 192 lb 6 oz (87.3 kg)    SpO2 98%    BMI 26.46 kg/m  , BMI Body mass index is 26.46 kg/m. GEN: Well nourished, well developed, in no acute distress  HEENT: normal  Neck: no JVD, carotid bruits, or masses Cardiac: Irregularly irregular; no murmurs, rubs, or gallops, +1 edema Respiratory: Lungs are clear, normal work of breathing GI: soft, nontender, nondistended, + BS MS: no deformity or atrophy  Skin: warm and dry, no rash Neuro:  Strength and sensation are intact Psych: euthymic mood, full affect   EKG:  EKG is ordered today. The ekg ordered today demonstrates atrial fibrillation with ventricular rate of 66 bpm.  Low voltage and poor R wave progression in the anterior leads.  Recent Labs: 01/31/2021: TSH 1.27 03/02/2021: ALT 20; BUN 17; Creatinine, Ser 0.92; Hemoglobin 15.7; Platelets 200; Potassium 4.4; Sodium 141    Lipid Panel    Component Value Date/Time   CHOL 145 11/21/2019 0915   CHOL 139 12/14/2014 0834   TRIG 99 11/21/2019 0915   HDL 67 11/21/2019 0915   HDL 43 12/14/2014 0834   CHOLHDL 2.2 11/21/2019 0915   VLDL 20 11/21/2019 0915   LDLCALC 58 11/21/2019 0915   LDLCALC 61 06/24/2018 0846      Wt Readings from Last 3 Encounters:  06/09/21 192 lb 6 oz (87.3 kg)  03/11/21 199 lb 6 oz (90.4 kg)  03/07/21 197 lb 1.5 oz (89.4 kg)        ASSESSMENT AND PLAN:   1.  Chronic atrial fibrillation: Ventricular rate is well controlled and Philip [name] is tolerating anticoagulation with Eliquis with no side effects.  Given his cardiomyopathy, we should consider switching Toprol tartrate to metoprolol succinate but dosing right now is not straightforward given that Philip [name] is on 75 mg  twice  daily.  2. Coronary artery disease involving native coronary arteries without angina: Philip [name] has no anginal symptoms.   3.  Chronic systolic heart failure: Most recent EF was 40 to 45%.  Continue treatment with metoprolol, losartan, spironolactone and Farxiga.  I elected to increase losartan to 25 mg once daily.  Hypotension has been an issue and thus we did not switch to [employer].  4. Hyperlipidemia: Continue treatment with atorvastatin.  Most recent lipid profile showed an LDL of 58.    Disposition:   FU with me in 6 months  Signed,  [name], MD  06/09/2021 8:10 AM    [place]

## 2021-06-09 NOTE — Patient Instructions (Signed)
Medication Instructions:  °Your physician has recommended you make the following change in your medication:  ° °INCREASE Losartan to 25 mg daily. An Rx has been sent to your pharmacy. ° °*If you need a refill on your cardiac medications before your next appointment, please call your pharmacy* ° ° °Lab Work: °None ordered °If you have labs (blood work) drawn today and your tests are completely normal, you will receive your results only by: °MyChart Message (if you have MyChart) OR °A paper copy in the mail °If you have any lab test that is abnormal or we need to change your treatment, we will call you to review the results. ° ° °Testing/Procedures: °None ordered ° ° °Follow-Up: °At [facility], you and your health needs are our priority.  As part of our continuing mission to provide you with exceptional heart care, we have created designated Provider Care Teams.  These Care Teams include your primary Cardiologist (physician) and Advanced Practice Providers (APPs -  Physician Assistants and Nurse Practitioners) who all work together to provide you with the care you need, when you need it. ° °We recommend signing up for the patient portal called "MyChart".  Sign up information is provided on this After Visit Summary.  MyChart is used to connect with patients for Virtual Visits (Telemedicine).  Patients are able to view lab/test results, encounter notes, upcoming appointments, etc.  Non-urgent messages can be sent to your provider as well.   °To learn more about what you can do with MyChart, go to [email or web address].com.   ° °Your next appointment:   °Your physician wants you to follow-up in: 6 months You will receive a reminder letter in the mail two months in advance. If you don't receive a letter, please call our office to schedule the follow-up appointment. ° ° °The format for your next appointment:   °In Person ° °Provider:   °You may see Muhammad Arida, MD or one of the following Advanced Practice Providers on  your designated Care Team:   °Christopher Berge, NP °Ryan Dunn, [name] °Cadence Furth, [name]{ ° ° ° °Other Instructions °N/A ° °

## 2021-06-15 LAB — HM HEPATITIS C SCREENING LAB: HM Hepatitis Screen: NEGATIVE

## 2021-06-28 DIAGNOSIS — I129 Hypertensive chronic kidney disease with stage 1 through stage 4 chronic kidney disease, or unspecified chronic kidney disease: Secondary | ICD-10-CM | POA: Diagnosis not present

## 2021-06-28 DIAGNOSIS — E1142 Type 2 diabetes mellitus with diabetic polyneuropathy: Secondary | ICD-10-CM | POA: Diagnosis not present

## 2021-06-28 DIAGNOSIS — J432 Centrilobular emphysema: Secondary | ICD-10-CM | POA: Diagnosis not present

## 2021-06-28 DIAGNOSIS — N182 Chronic kidney disease, stage 2 (mild): Secondary | ICD-10-CM

## 2021-06-29 ENCOUNTER — Telehealth: Payer: Medicare Other

## 2021-06-29 ENCOUNTER — Telehealth: Payer: Self-pay | Admitting: Pharmacist

## 2021-06-29 NOTE — Telephone Encounter (Signed)
?  Chronic Care Management  ? ?Outreach Note ? ?06/29/2021 ?Name: Philip RISING Sr. MRN: [id number] DOB: 07-27-1945 ? ?Referred by: [name], DO ?Reason for referral : No chief complaint on file. ? ?Was unable to reach patient via telephone today and have left HIPAA compliant voicemail asking patient to return my call.  ? ? ?Follow Up Plan: Will collaborate with Care Guide to outreach to schedule follow up with me ? ?Wallace Cullens, PharmD, BCACP ?Clinical Pharmacist ?[facility] ?[phone] ? ?

## 2021-07-01 NOTE — Telephone Encounter (Signed)
Patient called and rescheduled for 3/10 ?

## 2021-07-01 NOTE — Telephone Encounter (Deleted)
Patient called and rescheduled for 3/10 ?

## 2021-07-08 ENCOUNTER — Ambulatory Visit (INDEPENDENT_AMBULATORY_CARE_PROVIDER_SITE_OTHER): Payer: Medicare Other | Admitting: Pharmacist

## 2021-07-08 DIAGNOSIS — J432 Centrilobular emphysema: Secondary | ICD-10-CM

## 2021-07-08 DIAGNOSIS — E1142 Type 2 diabetes mellitus with diabetic polyneuropathy: Secondary | ICD-10-CM

## 2021-07-08 DIAGNOSIS — I129 Hypertensive chronic kidney disease with stage 1 through stage 4 chronic kidney disease, or unspecified chronic kidney disease: Secondary | ICD-10-CM

## 2021-07-08 NOTE — Patient Instructions (Signed)
Visit Information ? ?Thank you for taking time to visit with me today. Please don't hesitate to contact me if I can be of assistance to you before our next scheduled telephone appointment. ? ?Following are the goals we discussed today:  ? Goals Addressed   ? ?  ?  ?  ?  ? This Visit's Progress  ?  Pharmacy Goals     ?  Our goal A1c is less than 7%. This corresponds with fasting sugars less than 130 and 2 hour after meal sugars less than 180. Please check your blood sugar and keep log of results ? ?Please continue to monitor your home weight, blood pressure and heart rate ? ?Our goal bad cholesterol, or LDL, is less than 70 . This is why it is important to continue taking your atorvastatin ? ? ?Feel free to call me with any questions or concerns. I look forward to our next call! ? ? ?Wallace Cullens, PharmD, BCACP ?Clinical Pharmacist ?[facility] ?[place] ?[phone] ? ?  ? ?  ? ? ? ?Our next appointment is by telephone on 08/29/2021 at 9:15 AM ? ?Please call the care guide team at [phone] if you need to cancel or reschedule your appointment.  ? ? ?The patient verbalized understanding of instructions, educational materials, and care plan provided today and declined offer to receive copy of patient instructions, educational materials, and care plan.  ? ?

## 2021-07-08 NOTE — Chronic Care Management (AMB) (Signed)
Chronic Care Management CCM Pharmacy Note  07/08/2021 Name:  Philip HANNING Sr. MRN:  [id number] DOB:  [birth year]   Subjective: Philip Potters Sr. is an 76 y.o. year old adult who is a primary patient of [name], DO.  The CCM team was consulted for assistance with disease management and care coordination needs.    Engaged with patient by telephone for follow up visit for pharmacy case management and/or care coordination services.   Objective:  Medications Reviewed Today     Reviewed by [name], RPH-CPP (Pharmacist) on 07/08/21 at [phone]  Med List Status: <None>   Medication Order Taking? Sig Documenting Provider Last Dose Status Informant  acetaminophen (TYLENOL) 500 MG tablet [id number]  Take 1,000 mg by mouth every 8 (eight) hours as needed for moderate pain or mild pain. [provider]  Active Self  Albuterol Sulfate 108 (90 Base) MCG/ACT AEPB [id number]  Inhale 2 puffs into the lungs every 6 (six) hours as needed (shortness of breath).  [provider]  Active Self  apixaban (ELIQUIS) 5 MG TABS tablet [id number]  Take 5 mg by mouth 2 (two) times daily. [provider]  Active Self  ARTIFICIAL TEAR SOLUTION OP [id number]  Place 1 drop into both eyes daily. [provider]  Active Self  atorvastatin (LIPITOR) 40 MG tablet [id number]  Take 1 tablet by mouth once daily [name], MD  Active   Blood Glucose Monitoring Suppl (ONE TOUCH ULTRA 2) w/Device KIT [id number]  Use to check blood sugar as advised up to twice a day [name], DO  Active Self  calcium carbonate (TUMS - DOSED IN MG ELEMENTAL CALCIUM) 500 MG chewable tablet [id number]  Chew 1,000 mg by mouth daily as needed for indigestion or heartburn. [provider]  Active Self  cetirizine (ZYRTEC) 10 MG tablet [id number]  Take 10 mg by mouth 2 (two) times daily. [provider]  Active Self  EUTHYROX 88 MCG tablet [id number]  Take 1  tablet (88 mcg total) by mouth daily before breakfast. [name], DO  Active Self  FARXIGA 10 MG TABS tablet [id number] Yes Take 1 tablet (10 mg total) by mouth daily before breakfast. [name], DO Taking Active Self  fluticasone (FLONASE) 50 MCG/ACT nasal spray [id number]  Place 1 spray into both nostrils daily as needed for allergies or rhinitis. [provider]  Active Self  furosemide (LASIX) 40 MG tablet [id number] Yes Take 0.5-1 tablets (20-40 mg total) by mouth daily as needed for edema. Only as needed now. [name], DO Taking Active Self  gabapentin (NEURONTIN) 100 MG capsule [id number]  Take 1 capsule (100 mg total) by mouth 3 (three) times daily. [name], [name], DO  Active Self  ipratropium-albuterol (DUONEB) 0.5-2.5 (3) MG/3ML SOLN [id number]  Take 3 mLs by nebulization every 6 (six) hours as needed (shortness of breath). [provider]  Active Self           Med Note Jilda Roche A   Fri Dec 24, 2020  4:04 PM)    Lanell Matar THIN MISC [id number]  1 Device by Does not apply route 4 (four) times daily -  before meals and at bedtime. [name], MD  Active Self  losartan (COZAAR) 25 MG tablet [id number] Yes Take 1 tablet (25 mg total) by mouth every morning. [name], MD Taking Active   metFORMIN (GLUCOPHAGE) 500 MG tablet [id number] Yes Take 2 tablets (1,000  mg total) by mouth 2 (two) times daily with a meal. [name], [name], DO Taking Active Self  metoprolol tartrate (LOPRESSOR) 50 MG tablet [id number] Yes TAKE 1 & 1/2 (ONE & ONE-HALF) TABLETS BY MOUTH TWICE DAILY Furth, [name], [name] Taking Active   montelukast (SINGULAIR) 10 MG tablet [id number]  Take 10 mg by mouth at bedtime. [provider]  Active Self  Donald Siva test strip [id number]  USE TO CHECK BLOOD SUGAR TWICE DAILY [name], DO  Active   spironolactone (ALDACTONE) 25 MG tablet [id number] Yes Take 0.5  tablets (12.5 mg total) by mouth daily. Kathlen Mody, [name], [name] Taking Active Self  SYMBICORT 160-4.5 MCG/ACT inhaler [id number] Yes Inhale 2 puffs 2 (two) times daily into the lungs.  [provider] Taking Active Self  traMADol (ULTRAM) 50 MG tablet [id number]  Take 1 tablet (50 mg total) by mouth every 4 (four) hours as needed for moderate pain. Fausto Skillern, [name]  Active             Pertinent Labs:  Lab Results  Component Value Date   HGBA1C 6.8 (A) 01/31/2021   Lab Results  Component Value Date   CHOL 145 11/21/2019   HDL 67 11/21/2019   LDLCALC 58 11/21/2019   TRIG 99 11/21/2019   CHOLHDL 2.2 11/21/2019   Lab Results  Component Value Date   CREATININE 0.92 03/02/2021   BUN 17 03/02/2021   NA 141 03/02/2021   K 4.4 03/02/2021   CL 107 03/02/2021   CO2 26 03/02/2021    SDOH:  (Social Determinants of Health) assessments and interventions performed:    [place]  Review of patient past medical history, allergies, medications, health status, including review of consultants reports, laboratory and other test data, was performed as part of comprehensive evaluation and provision of chronic care management services.   Care Plan : PharmD - Medication Management/Medication Assistance  Updates made by [name], RPH-CPP since 07/08/2021 12:00 AM     Problem: Disease Progression      Long-Range Goal: Disease Progression Prevented or Minimized   Start Date: 06/16/2020  Expected End Date: 09/14/2020  Recent Progress: On track  Priority: High  Note:   Current Barriers:  Knowledge deficits related to dietary choices to improve blood sugar control Financial - difficulty with affording [name] through Medicare Part D health plan Patient APPROVED for patient assistance for Farxiga from [employer] through 04/30/2022 Patient was previously APPROVED for patient assistance for Eliquis from [place] from 01/06/21-04/30/21  Pharmacist Clinical Goal(s):  Over the next  90 days, patient will achieve control of T2DM as evidenced by A1C through collaboration with PharmD and provider.   Interventions: 1:1 collaboration with [name], DO regarding development and update of comprehensive plan of care as evidenced by provider attestation and co-signature Inter-disciplinary care team collaboration (see longitudinal plan of care) Perform chart review. Patient seen for Office [facility] with [facility] on 2/9. Provider advised patient: Increase losartan to 25 mg once daily Reports still has knee and back pain, but starting to do more exercises at home as previously given by physical therapy Patient uses weekly pillbox  Type 2 Diabetes Controlled; current treatment: metformin 500 mg - 2 tablets (1,000 mg) twice daily as directed Farxiga 10 mg - 1 tablet QAM   Reports has not been monitoring blood sugar a closely over past week as he was traveling Reports most recent blood sugar results:  Fasting Blood Glucose After Supper/Bedtime  4 - March 128 160  5 - March 122 197  6 - March 165 243  7 - March 134 162  8 - March 143 244  9 - March 129 165  10 - March 130    Attributes a couple of recent elevations in evening blood sugar to eating larger carbohydrate portion size Counsel on importance of continuing to control carbohydrate portion sizes and using blood sugar checks to reinforce dietary choices Will collaborate with PCP to recommend check of Vitamin B12 level at upcoming appointment as patient on long-term metformin  CHF/HTN Current treatment: Losartan 25 mg - 1 tablet daily Metoprolol 75 mg twice daily Spironolactone 25 mg - 1/2 tablet (12.5 mg) daily  Farxiga 10 mg - 1 tablets daily Furosemide 40 mg daily as needed as directed by Cardiologist Reports recent home blood pressure readings:  AM BP PM BP  4 - March 123/80, HR 62 120/74, HR 64  5 - March 118/73, HR 78 113/79, HR 77  6 - March 115/81, HR 74 105/71, HR 81  7 -  March 107/78, HR 81 114/82, HR 84  8 - March 126/81, HR 88 103/69, HR 82  9 - March 125/80, HR 68 99/68, HR 76  10 - March 130/76, HR 64    Denies symptoms of hypotension Note patient weighs himself daily and follows directions from Cardiologist for use of PRN furosemide (Lasix).  Counseled patient on importance of contacting Cardiologist for BP and HR readings outside of established parameters   COPD/seasonal allergies: Patient follows with Dr. Raul Del at [facility] Pulmonology Current treatment: Symbicort 160/4.5 two puffs bid Montelukast 10 mg QHS Albuterol/ipratropium nebulizer solution/ albuterol inhaler as needed Cetirizine 10 mg daily Flonase nasal spray as needed Using saline Navage pods Reports using maintenance (Symbicort) inhaler twice daily consistently and rinsing mouth out after each use and using rescue inhaler (albuterol) as needed Follow up with patient regarding sleep apnea/CPAP Denies restarting CPAP Have discussed rational for using CPAP nightly and encouraged patient to restart as recommended by Pulmonologist  Medication/Device Adherence Note patient uses weekly pillbox to organize medications   Patient Goals/Self-Care Activities Over the next 90 days, patient will:  Self administers medications as prescribed Attends all scheduled provider appointments Next appointment with PCP on 4/5 (lab work on 3/29) [place] for medication refills Calls provider office for new concerns or questions  Patient to check blood sugar regularly as directed and keep log Patient to keep log when checks blood pressure  Follow Up Plan: Telephone follow up appointment with care management team member scheduled for: 08/29/2021 at [place], PharmD, Para [place], [facility] [place] [phone]

## 2021-07-11 ENCOUNTER — Other Ambulatory Visit: Payer: Self-pay | Admitting: Family Medicine

## 2021-07-11 DIAGNOSIS — R351 Nocturia: Secondary | ICD-10-CM

## 2021-07-11 DIAGNOSIS — E1169 Type 2 diabetes mellitus with other specified complication: Secondary | ICD-10-CM

## 2021-07-11 DIAGNOSIS — E538 Deficiency of other specified B group vitamins: Secondary | ICD-10-CM

## 2021-07-11 DIAGNOSIS — I129 Hypertensive chronic kidney disease with stage 1 through stage 4 chronic kidney disease, or unspecified chronic kidney disease: Secondary | ICD-10-CM

## 2021-07-11 DIAGNOSIS — E1142 Type 2 diabetes mellitus with diabetic polyneuropathy: Secondary | ICD-10-CM

## 2021-07-11 DIAGNOSIS — Z0000 Encounter for general adult medical examination without abnormal findings: Secondary | ICD-10-CM

## 2021-07-11 DIAGNOSIS — E034 Atrophy of thyroid (acquired): Secondary | ICD-10-CM

## 2021-07-11 DIAGNOSIS — N182 Chronic kidney disease, stage 2 (mild): Secondary | ICD-10-CM

## 2021-07-11 DIAGNOSIS — E785 Hyperlipidemia, unspecified: Secondary | ICD-10-CM

## 2021-07-26 ENCOUNTER — Other Ambulatory Visit: Payer: Self-pay | Admitting: Cardiovascular Disease

## 2021-07-27 ENCOUNTER — Other Ambulatory Visit: Payer: Medicare Other

## 2021-07-27 DIAGNOSIS — E1142 Type 2 diabetes mellitus with diabetic polyneuropathy: Secondary | ICD-10-CM | POA: Diagnosis not present

## 2021-07-27 DIAGNOSIS — E1169 Type 2 diabetes mellitus with other specified complication: Secondary | ICD-10-CM | POA: Diagnosis not present

## 2021-07-27 DIAGNOSIS — Z0000 Encounter for general adult medical examination without abnormal findings: Secondary | ICD-10-CM

## 2021-07-27 DIAGNOSIS — R351 Nocturia: Secondary | ICD-10-CM

## 2021-07-27 DIAGNOSIS — E034 Atrophy of thyroid (acquired): Secondary | ICD-10-CM

## 2021-07-27 DIAGNOSIS — E538 Deficiency of other specified B group vitamins: Secondary | ICD-10-CM

## 2021-07-27 DIAGNOSIS — I129 Hypertensive chronic kidney disease with stage 1 through stage 4 chronic kidney disease, or unspecified chronic kidney disease: Secondary | ICD-10-CM | POA: Diagnosis not present

## 2021-07-27 DIAGNOSIS — E785 Hyperlipidemia, unspecified: Secondary | ICD-10-CM | POA: Diagnosis not present

## 2021-07-28 LAB — COMPLETE METABOLIC PANEL WITH GFR
AG Ratio: 2.5 (calc) (ref 1.0–2.5)
ALT: 19 U/L (ref 9–46)
AST: 21 U/L (ref 10–35)
Albumin: 4 g/dL (ref 3.6–5.1)
Alkaline phosphatase (APISO): 110 U/L (ref 35–144)
BUN: 20 mg/dL (ref 7–25)
CO2: 26 mmol/L (ref 20–32)
Calcium: 9.1 mg/dL (ref 8.6–10.3)
Chloride: 106 mmol/L (ref 98–110)
Creat: 1.13 mg/dL (ref 0.70–1.28)
Globulin: 1.6 g/dL (calc) — ABNORMAL LOW (ref 1.9–3.7)
Glucose, Bld: 166 mg/dL — ABNORMAL HIGH (ref 65–99)
Potassium: 4.3 mmol/L (ref 3.5–5.3)
Sodium: 141 mmol/L (ref 135–146)
Total Bilirubin: 0.3 mg/dL (ref 0.2–1.2)
Total Protein: 5.6 g/dL — ABNORMAL LOW (ref 6.1–8.1)
eGFR: 68 mL/min/{1.73_m2} (ref >=60)

## 2021-07-28 LAB — CBC WITH DIFFERENTIAL/PLATELET
Absolute Monocytes: 701 cells/uL (ref 200–950)
Basophils Absolute: 81 cells/uL (ref 0–200)
Basophils Relative: 1.3 %
Eosinophils Absolute: 316 cells/uL (ref 15–500)
Eosinophils Relative: 5.1 %
HCT: 41.5 % (ref 38.5–50.0)
Hemoglobin: 13.5 g/dL (ref 13.2–17.1)
Lymphs Abs: 1314 cells/uL (ref 850–3900)
MCH: 29 pg (ref 27.0–33.0)
MCHC: 32.5 g/dL (ref 32.0–36.0)
MCV: 89.2 fL (ref 80.0–100.0)
MPV: 10.3 fL (ref 7.5–12.5)
Monocytes Relative: 11.3 %
Neutro Abs: 3788 cells/uL (ref 1500–7800)
Neutrophils Relative %: 61.1 %
Platelets: 222 10*3/uL (ref 140–400)
RBC: 4.65 10*6/uL (ref 4.20–5.80)
RDW: 13.6 % (ref 11.0–15.0)
Total Lymphocyte: 21.2 %
WBC: 6.2 10*3/uL (ref 3.8–10.8)

## 2021-07-28 LAB — LIPID PANEL
Cholesterol: 117 mg/dL (ref <200)
HDL: 50 mg/dL (ref >=40)
LDL Cholesterol (Calc): 49 mg/dL (calc)
Non-HDL Cholesterol (Calc): 67 mg/dL (calc) (ref <130)
Total CHOL/HDL Ratio: 2.3 (calc) (ref <5.0)
Triglycerides: 95 mg/dL (ref <150)

## 2021-07-28 LAB — T4, FREE: Free T4: 1.2 ng/dL (ref 0.8–1.8)

## 2021-07-28 LAB — HEMOGLOBIN A1C
Hgb A1c MFr Bld: 7.1 % of total Hgb — ABNORMAL HIGH (ref <5.7)
Mean Plasma Glucose: 157 mg/dL
eAG (mmol/L): 8.7 mmol/L

## 2021-07-28 LAB — TSH: TSH: 5.82 mIU/L — ABNORMAL HIGH (ref 0.40–4.50)

## 2021-07-28 LAB — PSA: PSA: 0.67 ng/mL (ref <=4.00)

## 2021-07-28 LAB — VITAMIN B12: Vitamin B-12: 253 pg/mL (ref 200–1100)

## 2021-07-29 DIAGNOSIS — I509 Heart failure, unspecified: Secondary | ICD-10-CM

## 2021-07-29 DIAGNOSIS — J449 Chronic obstructive pulmonary disease, unspecified: Secondary | ICD-10-CM

## 2021-07-29 DIAGNOSIS — E1159 Type 2 diabetes mellitus with other circulatory complications: Secondary | ICD-10-CM

## 2021-07-29 DIAGNOSIS — Z7984 Long term (current) use of oral hypoglycemic drugs: Secondary | ICD-10-CM

## 2021-07-29 DIAGNOSIS — I119 Hypertensive heart disease without heart failure: Secondary | ICD-10-CM

## 2021-08-02 ENCOUNTER — Telehealth (INDEPENDENT_AMBULATORY_CARE_PROVIDER_SITE_OTHER): Payer: Medicare Other | Admitting: Family Medicine

## 2021-08-02 ENCOUNTER — Encounter: Payer: Self-pay | Admitting: Family Medicine

## 2021-08-02 VITALS — Ht 71.5 in | Wt 192.0 lb

## 2021-08-02 DIAGNOSIS — Z91199 Patient's noncompliance with other medical treatment and regimen due to unspecified reason: Secondary | ICD-10-CM

## 2021-08-03 ENCOUNTER — Ambulatory Visit (INDEPENDENT_AMBULATORY_CARE_PROVIDER_SITE_OTHER): Payer: Medicare Other | Admitting: Family Medicine

## 2021-08-03 ENCOUNTER — Encounter: Payer: Self-pay | Admitting: Family Medicine

## 2021-08-03 VITALS — BP 122/70 | HR 84 | Temp 98.6°F | Ht 71.0 in | Wt 192.2 lb

## 2021-08-03 DIAGNOSIS — N182 Chronic kidney disease, stage 2 (mild): Secondary | ICD-10-CM | POA: Diagnosis not present

## 2021-08-03 DIAGNOSIS — Z0000 Encounter for general adult medical examination without abnormal findings: Secondary | ICD-10-CM

## 2021-08-03 DIAGNOSIS — I129 Hypertensive chronic kidney disease with stage 1 through stage 4 chronic kidney disease, or unspecified chronic kidney disease: Secondary | ICD-10-CM | POA: Diagnosis not present

## 2021-08-03 DIAGNOSIS — E785 Hyperlipidemia, unspecified: Secondary | ICD-10-CM

## 2021-08-03 DIAGNOSIS — E1169 Type 2 diabetes mellitus with other specified complication: Secondary | ICD-10-CM

## 2021-08-03 DIAGNOSIS — E034 Atrophy of thyroid (acquired): Secondary | ICD-10-CM | POA: Diagnosis not present

## 2021-08-03 DIAGNOSIS — J0110 Acute frontal sinusitis, unspecified: Secondary | ICD-10-CM | POA: Diagnosis not present

## 2021-08-03 DIAGNOSIS — E538 Deficiency of other specified B group vitamins: Secondary | ICD-10-CM | POA: Diagnosis not present

## 2021-08-03 DIAGNOSIS — E1142 Type 2 diabetes mellitus with diabetic polyneuropathy: Secondary | ICD-10-CM | POA: Diagnosis not present

## 2021-08-03 MED ORDER — FLUTICASONE PROPIONATE 50 MCG/ACT NA SUSP: 2.0000 | Freq: Every day | NASAL | 3 refills | Status: DC

## 2021-08-03 MED ORDER — PREDNISONE 10 MG PO TABS
ORAL_TABLET | ORAL | 0 refills | Status: DC
Start: 2021-08-03 — End: 2021-08-29

## 2021-08-03 MED ORDER — AZITHROMYCIN 250 MG PO TABS: ORAL_TABLET | ORAL | 0 refills | Status: DC

## 2021-08-03 NOTE — Patient Instructions (Addendum)
Thank you for coming to the office today. ? ?Vitamin B12 level is normal but on the low end. ?Recommend OTC supplement with oral Vitamin B12 1060mg daily ? ?Recent Labs  ?  01/31/21 ?1412 07/27/21 ?0756  ?HGBA1C 6.8* 7.1*  ? ? ?Thyroid is mildly off - TSH signal, but actual T4 hormone is normal range. Keep on same dose Euthyrox 88, no change today! ? ?Please schedule a Follow-up Appointment to: Return in about 6 months (around 02/02/2022) for 6 month DM A1c. ? ?If you have any other questions or concerns, please feel free to call the office or send a message through [facility] You may also schedule an earlier appointment if necessary. ? ?Additionally, you may be receiving a survey about your experience at our office within a few days to 1 week by e-mail or mail. We value your feedback. ? ?ANobie Putnam DO ?[place]?

## 2021-08-03 NOTE — Assessment & Plan Note (Signed)
Well-controlled HTN ?Complication with CKD-II ?  ? ?Plan:  ?1. Continue current BP regimen - Losartan 25 mg daily ?2. Encourage improved lifestyle - low sodium diet, regular exercise ?3. Continue monitor BP outside office, bring readings to next visit, if persistently >140/90 or new symptoms notify office sooner ?

## 2021-08-03 NOTE — Assessment & Plan Note (Addendum)
TSH mild elevated, T4 normal. ?Continue Euthyrox 66mg daily ?

## 2021-08-03 NOTE — Progress Notes (Signed)
? ?Subjective:  ? ? Patient ID: Philip Potters Sr., adult    DOB: [birth year], 76 y.o.   MRN: [id number] ? ?Philip DEWALD Sr. is a 76 y.o. adult presenting on 08/03/2021 for Annual Exam and Nasal Congestion ? ? ?HPI ? ?Here for Annual Physical and Lab Review. ? ?CHRONIC DM, Type 2: ?Recent updates with improved CBGs. ?Philip [name] has had prednisone burst recently but otherwise had done well ?Philip [name] has improved his diet overall ?Meds: Metformin 108m twice daily (5055mtabs), Farxiga 1049maily (through PAP assistance) ?- OFF Januvia, Bydureon, Lantus ?Reports good compliance. 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 ?Denies hypoglycemia ?  ?Lumbar DDD ?S/p Lumbar diskectomy 01/07/21, R L3-4 [facility]ke[facility] ?Philip [name] has had issue with prolong sitting and issues since surgery but Philip [name] has done well today and doing better. Philip [name] was on oxycodone initially, and now on steroid. ?- Philip [name] was doing some PT that seems to have aggravated some symptoms. ? ?HYPERLIPIDEMIA: ?- Reports no concerns. Last lipid panel 06/2021, controlled on Statin ?- Currently taking Atorvastatin 45m22molerating well without side effects or myalgias ?  ?Hypothyroidism ?On Euthyrox 88mc61mily, needs new order and lab ?Last Thyroid panel TSH up to 5.82, and Free T4 1.2 ? ?Vitamin B12 Deficiency ?Lab showed B12 253 ? ?Additional updates ? ?Sinsusitis ?Allergies ?Reports onset for past 1 week with some sinus congestion symptoms, worse with outdoor exposure allergies. Philip [name] has used sinus lavage. ?- Using Albuterol Nebulizer PRN. ?Needs re order on Flonase ?Singulair nightly ? ? ?Health Maintenance: ? ?PSA 0.67 (negative result, 06/2021) ? ? ?  01/31/2021  ?  1:57 PM 08/03/2020  ?  9:48 AM 03/12/2019  ?  9:16 AM  ?Depression screen PHQ 2/9  ?Decreased Interest 3 0 0  ?Down,  Depressed, Hopeless 0 0 0  ?PHQ - 2 Score 3 0 0  ?Altered sleeping 3    ?Tired, decreased energy 3    ?Change in appetite 3    ?Feeling bad or failure about yourself  0    ?Trouble concentrating 0    ?Moving slowly or fidgety/restless 0    ?Suicidal thoughts 0    ?PHQ-9 Score 12    ?Difficult doing work/chores Not difficult at all    ? ? ?Past Medical History:  ?Diagnosis Date  ? (HFpEF) heart failure with preserved ejection fraction (HCC) [place] a. 06/2015 Echo: EF 50-55%, Gr1 DD. Mild MR. Mildly dil LA. Nl RV fxn. Nl PASP.  ? Arthritis   ? a. right knee  ? Asthma   ? Atrial tachycardia (HCC) [place] Chronic anticoagulation   ? Apixaban  ? Collapse of right lung   ? a. 12/1967 - ? etiology.  ? COPD (chronic obstructive pulmonary disease) (HCC) [place] Coronary artery disease   ? a. 05/2012 Cath: severe 3VD-->CABG x 3 by Dr. GerhaServando Snare1/10/2012: [place] Jun->LCX & SVG->RPDA; b. 06/2014 MV: no ischemia/infarct.  ? DDD (degenerative disc disease), lumbar   ? Dyspnea   ? Edema   ? LEGS/ FEET  ? Essential hypertension   ? History of Helicobacter pylori infection   ? HNP (herniated nucleus pulposus), lumbar   ? HOH (hard of hearing)   ? Hypercholesteremia   ? Hypothyroidism   ? Low back pain radiating to lower extremity   ?  Lumbar radiculitis   ? Lumbar stenosis with neurogenic claudication   ? Neuropathy   ? PAF (paroxysmal atrial fibrillation) ([place])   ? a. CHA2DS2VASc = 5 (age, CHF, HTN, vascular disase, T2DM) --> Eliquis.  ? Sepsis ([place]) 2021  ? Shingles   ? Sleep apnea   ? NO CPAP  ? Type II diabetes mellitus ([place])   ? ?Past Surgical History:  ?Procedure Laterality Date  ? ANTERIOR CERVICAL DECOMP/DISCECTOMY FUSION  ~ 2009  ? CARDIAC CATHETERIZATION  05/06/2012  ? Significant three-vessel coronary artery disease with normal ejection fraction  ? CATARACT EXTRACTION W/PHACO Right 08/15/2017  ? Procedure: CATARACT EXTRACTION PHACO AND INTRAOCULAR LENS PLACEMENT (IOC);  Surgeon: Birder Robson, MD;  Location: [facility];   Service: Ophthalmology;  Laterality: Right;  [place] 00:23 ?AP% 13.3 ?CDE 3.11 ?Fluid pack lot # [id number]  ? CATARACT EXTRACTION W/PHACO Left 10/03/2017  ? Procedure: CATARACT EXTRACTION PHACO AND INTRAOCULAR LENS PLACEMENT (IOC);  Surgeon: Birder Robson, MD;  Location: [facility];  Service: Ophthalmology;  Laterality: Left;  Lot #[id number] ?[place]: 00:21.2 ?AP%:13.9 ?CDE: 2.96  ? CORONARY ARTERY BYPASS GRAFT  05/07/2012  ? Procedure: CORONARY ARTERY BYPASS GRAFTING (CABG);  Surgeon: [name], MD;  Location: [place];  Service: Open Heart Surgery;  Laterality: N/A;  times three  ? INTRAOPERATIVE TRANSESOPHAGEAL ECHOCARDIOGRAM  05/07/2012  ? Procedure: INTRAOPERATIVE TRANSESOPHAGEAL ECHOCARDIOGRAM;  Surgeon: [name], MD;  Location: [place];  Service: Open Heart Surgery;  Laterality: N/A;  ? KNEE ARTHROPLASTY Right 03/07/2021  ? Procedure: COMPUTER ASSISTED TOTAL RIGHT KNEE ARTHROPLASTY -;  Surgeon: Dereck Leep, MD;  Location: [facility];  Service: Orthopedics;  Laterality: Right;  ? KNEE ARTHROSCOPY Right ~ 2000  ? KNEE SURGERY Right 2003  ? LUMBAR LAMINECTOMY/DECOMPRESSION MICRODISCECTOMY Right 01/07/2021  ? Procedure: RIGHT L3-4 MICRODISCECTOMY 1 LEVEL;  Surgeon: Meade Maw, MD;  Location: [facility];  Service: Neurosurgery;  Laterality: Right;  ? TEE WITHOUT CARDIOVERSION N/A 07/04/2019  ? Procedure: TRANSESOPHAGEAL ECHOCARDIOGRAM (TEE);  Surgeon: Nelva Bush, MD;  Location: [facility];  Service: Cardiovascular;  Laterality: N/A;  ? ?Social History  ? ?Socioeconomic History  ? Marital status: Married  ?  Spouse name: Not on file  ? Number of children: Not on file  ? Years of education: Not on file  ? Highest education level: Some college, no degree  ?Occupational History  ? Occupation: retired  ?Tobacco Use  ? Smoking status: Former  ?  Packs/day: 1.00  ?  Years: 8.00  ?  Pack years: 8.00  ?  Types: Cigarettes  ?  Quit date: 12/31/1967  ?  Years since quitting: 53.6  ? Smokeless tobacco: Former   ?Vaping Use  ? Vaping Use: Never used  ?Substance and Sexual Activity  ? Alcohol use: No  ?  Alcohol/week: 0.0 standard drinks  ?  Comment: 05/06/2012 "last alcohol several years ago; never had problem wit"  ? Drug use: No  ? Sexual activity: Not Currently  ?Other Topics Concern  ? Not on file  ?Social History Narrative  ? ** Merged History Encounter **  ?    ?   ? Working part time - not currently working because of covid-19   ? ?Social Determinants of Health  ? ?Financial Resource Strain: Low Risk   ? Difficulty of Paying Living Expenses: Not hard at all  ?Food Insecurity: No Food Insecurity  ? Worried About Charity fundraiser in the Last Year: Never true  ? Ran Out of Food in  the Last Year: Never true  ?Transportation Needs: No Transportation Needs  ? Lack of Transportation (Medical): No  ? Lack of Transportation (Non-Medical): No  ?Physical Activity: Inactive  ? Days of Exercise per Week: 0 days  ? Minutes of Exercise per Session: 0 min  ?Stress: Stress Concern Present  ? Feeling of Stress : To some extent  ?Social Connections: Not on file  ?Intimate Partner Violence: Not on file  ? ?Family History  ?Problem Relation Age of Onset  ? Heart disease Mother   ? Clotting disorder Mother   ? Hypertension Sister   ? ?Current Outpatient Medications on File Prior to Visit  ?Medication Sig  ? acetaminophen (TYLENOL) 500 MG tablet Take 1,000 mg by mouth every 8 (eight) hours as needed for moderate pain or mild pain.  ? Albuterol Sulfate 108 (90 Base) MCG/ACT AEPB Inhale 2 puffs into the lungs every 6 (six) hours as needed (shortness of breath).   ? apixaban (ELIQUIS) 5 MG TABS tablet Take 5 mg by mouth 2 (two) times daily.  ? ARTIFICIAL TEAR SOLUTION OP Place 1 drop into both eyes daily.  ? atorvastatin (LIPITOR) 40 MG tablet Take 1 tablet by mouth once daily  ? Blood Glucose Monitoring Suppl (ONE TOUCH ULTRA 2) w/Device KIT Use to check blood sugar as advised up to twice a day  ? calcium carbonate (TUMS - DOSED IN MG  ELEMENTAL CALCIUM) 500 MG chewable tablet Chew 1,000 mg by mouth daily as needed for indigestion or heartburn.  ? cetirizine (ZYRTEC) 10 MG tablet Take 10 mg by mouth 2 (two) times daily.  ? EUTHYROX 88 MCG tablet Take 1 table

## 2021-08-03 NOTE — Progress Notes (Signed)
Unable to reach patient on mychart video or phone call at time of appointment. ?

## 2021-08-03 NOTE — Assessment & Plan Note (Addendum)
Controlled cholesterol on statin and improved lifestyle ? ? ? ?Plan: ?1. Continue current meds - Atorvastatin '40mg'$  daily ?2. Encourage improved lifestyle - low carb/cholesterol, reduce portion size, continue improving regular exercise ?

## 2021-08-03 NOTE — Assessment & Plan Note (Signed)
Stable A1c 7.1 ?- No hypoglycemia ?Complications - CKD-II, peripheral neuropathy, cataracts ?Remains OFF GLP1 Bydureon (allergy), and Lantus insulin, Januvia ? ?Plan:  ?Continue Farxiga '10mg'$  through PAP ?Continue Metformin '1000mg'$  BID ('500mg'$  x 2 BID) ?Encourage improved lifestyle - low carb, low sugar diet, reduce portion size, continue improving regular exercise - emphasized limiting sweets / eating out - he admits these are raising sugar lately ?Check CBG, bring log to next visit for review ?Continue ARB, Statin ?DM Foot ?

## 2021-08-09 ENCOUNTER — Ambulatory Visit: Payer: Medicare Other

## 2021-08-11 ENCOUNTER — Ambulatory Visit (INDEPENDENT_AMBULATORY_CARE_PROVIDER_SITE_OTHER): Payer: Medicare Other

## 2021-08-11 VITALS — BP 101/57 | HR 60 | Temp 98.2°F | Resp 18 | Ht 69.5 in | Wt 192.0 lb

## 2021-08-11 DIAGNOSIS — Z0000 Encounter for general adult medical examination without abnormal findings: Secondary | ICD-10-CM | POA: Diagnosis not present

## 2021-08-11 NOTE — Patient Instructions (Signed)
Screening for Type 2 Diabetes ?A screening test for type 2 diabetes (type 2 diabetes mellitus) is a blood test to measure your blood sugar (glucose) level. This test is done to check for early signs of diabetes, before you develop symptoms.  ?Type 2 diabetes is a long-term (chronic) disease. In type 2 diabetes, one or both of these problems may be present: ?The pancreas does not make enough of a hormone called insulin. ?Cells in the body do not respond properly to insulin that the body makes (insulin resistance). ?Normally, insulin allows blood sugar (glucose) to enter cells in the body. The cells use glucose for energy. Insulin resistance or lack of insulin causes excess glucose to build up in the blood instead of going into cells. This results in high blood glucose levels (hyperglycemia), which can cause many complications. ?You may be screened for type 2 diabetes as part of your regular health care, especially if you have a high risk for diabetes. Screening can help to identify type 2 diabetes at its early stage (prediabetes). Identifying and treating prediabetes may delay or prevent the development of type 2 diabetes. ?Tell a health care provider about: ?All medicines you are taking, including vitamins, herbs, eye drops, creams, and over-the-counter medicines. ?Any bleeding problems you have. ?Any medical conditions you have. ?Whether you are pregnant or may be pregnant. ?Who should be screened for type 2 diabetes? ?Adults ?Adults age 71 and older. These adults should be screened once every three years. ?Adults who are any age, are overweight, and have one other risk factor. These adults should be screened once every three years. ?Adults who have normal blood glucose levels and two or more risk factors. These adults may be screened once every year (annually). ?Women who have had gestational diabetes in the past. These women should be screened once every three years. ?Pregnant women who have risk factors. These  women should be screened at their first prenatal visit and again between weeks 24 and 28 of pregnancy. ?Children and adolescents ?Children and adolescents should be screened for type 2 diabetes if they are overweight and have any of the following risk factors: ?A family history of type 2 diabetes. ?Being a member of a high-risk ethnic group. ?Signs of insulin resistance or conditions that are associated with insulin resistance. ?A mother who had gestational diabetes while pregnant. ?Screening should be done at least once every three years, starting at age 73 or at the onset of puberty, whichever comes first. ?Your health care provider or your child's health care provider may recommend having a screening more or less often. ?What are the risk factors for type 2 diabetes? ?The following are factors that may make you more likely to develop type 2 diabetes and can be modified: ?Not getting enough exercise. ?Having high blood pressure. ?Having low levels of good cholesterol (HDL-C) or high levels of blood fats (triglycerides). ?Having high blood glucose in a previous blood test. ?Being overweight or obese. ?The following are factors that may make you more likely to develop type 2 diabetes and can not be modified: ?Having a parent or sibling (first-degree relative) who has diabetes. ?Being of American-Indian, African-American, Hispanic/Latino, Asian, or [place] descent. ?Being older than age 40. ?Having a history of diabetes during pregnancy (gestational diabetes). ?Having certain diseases or conditions that may be caused by insulin resistance, including: ?Acanthosis nigricans. This is a condition that causes dark skin on the neck, armpits, and groin. ?Polycystic ovary syndrome (PCOS). ?Cardiovascular heart disease. ?What happens during  screening? ?During screening, your health care provider may ask questions about: ?Your health and your risk factors, including your activity level and any medical conditions that  you have. ?The health of your first-degree relatives. ?Past pregnancies, if this applies. ?Your health care provider will also do a physical exam, including a blood pressure measurement and blood tests. There are four blood tests that can be used to screen for type 2 diabetes. You may have one or more of the following: ?A fasting blood glucose (FBG) test. You will not be allowed to eat (you will fast) for 8 hours or more before a blood sample is taken. ?A random blood glucose test. This test checks your blood glucose at any time of the day regardless of when you ate. ?An oral glucose tolerance test (OGTT). This test measures your blood glucose at two times: ?After you have not eaten (have fasted) overnight. This is your baseline glucose level. ?Two hours after you drink a glucose-containing beverage. ?An A1C (hemoglobin A1C) blood test. This test provides information about blood glucose control over the previous 2-3 months. ?What do the results mean? ?Your test results are a measurement of how much glucose is in your blood. Normal blood glucose levels mean that you do not have diabetes or prediabetes. High blood glucose levels may mean that you have prediabetes or diabetes. Depending on the results, other tests may be needed to confirm the diagnosis. ?You may be diagnosed with type 2 diabetes if: ?Your FBG level is 126 mg/dL (7.0 mmol/L) or higher. ?Your random blood glucose level is 200 mg/dL (11.1 mmol/L) or higher. ?Your A1C level is 6.5% or higher. ?Your OGTT result is higher than 200 mg/dL (11.1 mmol/L). ?These blood tests may be repeated to confirm your diagnosis. Talk with your health care provider about what your results mean. ?Summary ?A screening test for type 2 diabetes (type 2 diabetes mellitus) is a blood test to measure your blood sugar (glucose) level. ?Know what your risk factors are for developing type 2 diabetes. ?If you are at risk, get screening tests as often as told by your health care  provider. ?Screening may help you identify type 2 diabetes at its early stage (prediabetes). Identifying and treating prediabetes may delay or prevent the development of type 2 diabetes. ?This information is not intended to replace advice given to you by your health care provider. Make sure you discuss any questions you have with your health care provider. ?Document Revised: 07/12/2020 Document Reviewed: 07/12/2020 ?Elsevier Patient Education ? [place]. ? ?Fall Prevention in the Home, Adult ?Falls can cause injuries and can happen to people of all ages. There are many things you can do to make your home safe and to help prevent falls. Ask for help when making these changes. ?What actions can I take to prevent falls? ?General Instructions ?Use good lighting in all rooms. Replace any light bulbs that burn out. ?Turn on the lights in dark areas. Use night-lights. ?Keep items that you use often in easy-to-reach places. Lower the shelves around your home if needed. ?Set up your furniture so you have a clear path. Avoid moving your furniture around. ?Do not have throw rugs or other things on the floor that can make you trip. ?Avoid walking on wet floors. ?If any of your floors are uneven, fix them. ?Add color or contrast paint or tape to clearly mark and help you see: ?Grab bars or handrails. ?First and last steps of staircases. ?Where the edge of each  step is. ?If you use a stepladder: ?Make sure that it is fully opened. Do not climb a closed stepladder. ?Make sure the sides of the stepladder are locked in place. ?Ask someone to hold the stepladder while you use it. ?Know where your pets are when moving through your home. ?What can I do in the bathroom? ?  ?Keep the floor dry. Clean up any water on the floor right away. ?Remove soap buildup in the tub or shower. ?Use nonskid mats or decals on the floor of the tub or shower. ?Attach bath mats securely with double-sided, nonslip rug tape. ?If you need to sit down  in the shower, use a plastic, nonslip stool. ?Install grab bars by the toilet and in the tub and shower. Do not use towel bars as grab bars. ?What can I do in the bedroom? ?Make sure that you have a light by

## 2021-08-11 NOTE — Progress Notes (Signed)
? ?Subjective:  ? Philip Potters Sr. is a 76 y.o. male who presents for Medicare Annual/Subsequent preventive examination. ? ?Review of Systems    ?Per HPI unless specifically indicated below  ? ?  ? ?   ?Objective:  ?  ?Today's Vitals  ? 08/11/21 1307  ?BP: (!) 101/57  ?Pulse: 60  ?Resp: 18  ?Temp: 98.2 ?F (36.8 ?C)  ?TempSrc: Oral  ?SpO2: 96%  ?Weight: 192 lb (87.1 kg)  ?Height: 5' 9.5" (1.765 m)  ?PainSc: 0-No pain  ? ?Body mass index is 27.95 kg/m?. ? ? ?  03/07/2021  ?  7:19 AM 03/02/2021  ? 12:48 PM 01/07/2021  ?  6:31 AM 12/31/2020  ? 11:49 AM 08/03/2020  ?  9:47 AM 06/27/2019  ?  2:46 AM 06/26/2019  ?  9:42 PM  ?Advanced Directives  ?Does Patient Have a Medical Advance Directive? No Yes No No No  No  ?Would patient like information on creating a medical advance directive? No - Patient declined  No - Patient declined Yes (MAU/Ambulatory/Procedural Areas - Information given)  No - Patient declined   ? ? ?Current Medications (verified) ?Outpatient Encounter Medications as of 08/11/2021  ?Medication Sig  ? acetaminophen (TYLENOL) 500 MG tablet Take 1,000 mg by mouth every 8 (eight) hours as needed for moderate pain or mild pain.  ? Albuterol Sulfate 108 (90 Base) MCG/ACT AEPB Inhale 2 puffs into the lungs every 6 (six) hours as needed (shortness of breath).   ? apixaban (ELIQUIS) 5 MG TABS tablet Take 5 mg by mouth 2 (two) times daily.  ? ARTIFICIAL TEAR SOLUTION OP Place 1 drop into both eyes daily.  ? atorvastatin (LIPITOR) 40 MG tablet Take 1 tablet by mouth once daily  ? Blood Glucose Monitoring Suppl (ONE TOUCH ULTRA 2) w/Device KIT Use to check blood sugar as advised up to twice a day  ? calcium carbonate (TUMS - DOSED IN MG ELEMENTAL CALCIUM) 500 MG chewable tablet Chew 1,000 mg by mouth daily as needed for indigestion or heartburn.  ? cetirizine (ZYRTEC) 10 MG tablet Take 10 mg by mouth 2 (two) times daily.  ? EUTHYROX 88 MCG tablet Take 1 tablet (88 mcg total) by mouth daily before breakfast.  ? FARXIGA 10 MG  TABS tablet Take 1 tablet (10 mg total) by mouth daily before breakfast.  ? fluticasone (FLONASE) 50 MCG/ACT nasal spray Place 2 sprays into both nostrils daily.  ? furosemide (LASIX) 40 MG tablet Take 0.5-1 tablets (20-40 mg total) by mouth daily as needed for edema. Only as needed now.  ? gabapentin (NEURONTIN) 100 MG capsule Take 1 capsule (100 mg total) by mouth 3 (three) times daily.  ? ipratropium-albuterol (DUONEB) 0.5-2.5 (3) MG/3ML SOLN Take 3 mLs by nebulization every 6 (six) hours as needed (shortness of breath).  ? LANCETS ULTRA THIN MISC 1 Device by Does not apply route 4 (four) times daily -  before meals and at bedtime.  ? losartan (COZAAR) 25 MG tablet Take 1 tablet (25 mg total) by mouth every morning.  ? metFORMIN (GLUCOPHAGE) 500 MG tablet Take 2 tablets (1,000 mg total) by mouth 2 (two) times daily with a meal.  ? metoprolol tartrate (LOPRESSOR) 50 MG tablet TAKE 1 & 1/2 (ONE & ONE-HALF) TABLETS BY MOUTH TWICE DAILY  ? montelukast (SINGULAIR) 10 MG tablet Take 10 mg by mouth at bedtime.  ? ONETOUCH ULTRA test strip USE TO CHECK BLOOD SUGAR TWICE DAILY  ? spironolactone (ALDACTONE) 25 MG tablet Take  0.5 tablets (12.5 mg total) by mouth daily.  ? SYMBICORT 160-4.5 MCG/ACT inhaler Inhale 2 puffs 2 (two) times daily into the lungs.   ? predniSONE (DELTASONE) 10 MG tablet Take 6 tabs with breakfast Day 1, 5 tabs Day 2, 4 tabs Day 3, 3 tabs Day 4, 2 tabs Day 5, 1 tab Day 6. (Patient not taking: Reported on 08/11/2021)  ? [DISCONTINUED] azithromycin (ZITHROMAX Z-PAK) 250 MG tablet Take 2 tabs (544m total) on Day 1. Take 1 tab (256m daily for next 4 days. (Patient not taking: Reported on 08/11/2021)  ? [DISCONTINUED] traMADol (ULTRAM) 50 MG tablet Take 1 tablet (50 mg total) by mouth every 4 (four) hours as needed for moderate pain. (Patient not taking: Reported on 08/11/2021)  ? ?No facility-administered encounter medications on file as of 08/11/2021.  ? ? ?Allergies (verified) ?Bydureon [exenatide],  Clindamycin/lincomycin, Penicillins, Sulfa antibiotics, Tiotropium, Penicillin g, Spiriferis, Spiriva handihaler [tiotropium bromide monohydrate], Iodinated contrast media, and Other  ? ?History: ?Past Medical History:  ?Diagnosis Date  ? (HFpEF) heart failure with preserved ejection fraction (HC[facility]iawas[place]  ? a. 06/2015 Echo: EF 50-55%, Gr1 DD. Mild MR. Mildly dil LA. Nl RV fxn. Nl PASP.  ? Arthritis   ? a. right knee  ? Asthma   ? Atrial tachycardia ([facility]  ? Chronic anticoagulation   ? Apixaban  ? Collapse of right lung   ? a. 12/1967 - ? etiology.  ? COPD (chronic obstructive pulmonary disease) ([facility]  ? Coronary artery disease   ? a. 05/2012 Cath: severe 3VD-->CABG x 3 by Dr. GeServando Snaren 05/07/2012: Scot JunSVG->LCX & SVG->RPDA; b. 06/2014 MV: no ischemia/infarct.  ? DDD (degenerative disc disease), lumbar   ? Dyspnea   ? Edema   ? LEGS/ FEET  ? Essential hypertension   ? History of Helicobacter pylori infection   ? HNP (herniated nucleus pulposus), lumbar   ? HOH (hard of hearing)   ? Hypercholesteremia   ? Hypothyroidism   ? Low back pain radiating to lower extremity   ? Lumbar radiculitis   ? Lumbar stenosis with neurogenic claudication   ? Neuropathy   ? PAF (paroxysmal atrial fibrillation) ([facility]  ? a. CHA2DS2VASc = 5 (age, CHF, HTN, vascular disase, T2DM) --> Eliquis.  ? Sepsis (HCEnosburg Falls2021  ? Shingles   ? Sleep apnea   ? NO CPAP  ? Type II diabetes mellitus (HC[facility]  ? ?Past Surgical History:  ?Procedure Laterality Date  ? ANTERIOR CERVICAL DECOMP/DISCECTOMY FUSION  ~ 2009  ? CARDIAC CATHETERIZATION  05/06/2012  ? Significant three-vessel coronary artery disease with normal ejection fraction  ? CATARACT EXTRACTION W/PHACO Right 08/15/2017  ? Procedure: CATARACT EXTRACTION PHACO AND INTRAOCULAR LENS PLACEMENT (IOC);  Surgeon: PoBirder RobsonMD;  Location: [facility] OR[facility];  Service: Ophthalmology;  Laterality: Right;  [place]:23 ?AP% 13.3 ?CDE 3.11 ?Fluid pack lot # [id number]  ? CATARACT EXTRACTION W/PHACO Left 10/03/2017  ?  Procedure: CATARACT EXTRACTION PHACO AND INTRAOCULAR LENS PLACEMENT (IOC);  Surgeon: PoBirder RobsonMD;  Location: [facility];  Service: Ophthalmology;  Laterality: Left;  Lot #[id number] ?US[place]00:21.2 ?AP%:13.9 ?CDE: 2.96  ? CORONARY ARTERY BYPASS GRAFT  05/07/2012  ? Procedure: CORONARY ARTERY BYPASS GRAFTING (CABG);  Surgeon: EdGrace IsaacMD;  [place] [facility] Service: Open Heart Surgery;  Laterality: N/A;  times three  ? INTRAOPERATIVE TRANSESOPHAGEAL ECHOCARDIOGRAM  05/07/2012  ? Procedure: INTRAOPERATIVE TRANSESOPHAGEAL ECHOCARDIOGRAM;  Surgeon: EdGrace IsaacMD;  [facility] Service: Open Heart  Surgery;  Laterality: N/A;  ? KNEE ARTHROPLASTY Right 03/07/2021  ? Procedure: COMPUTER ASSISTED TOTAL RIGHT KNEE ARTHROPLASTY -;  Surgeon: Dereck Leep, MD;  Location: [facility];  Service: Orthopedics;  Laterality: Right;  ? KNEE ARTHROSCOPY Right ~ 2000  ? KNEE SURGERY Right 2003  ? LUMBAR LAMINECTOMY/DECOMPRESSION MICRODISCECTOMY Right 01/07/2021  ? Procedure: RIGHT L3-4 MICRODISCECTOMY 1 LEVEL;  Surgeon: Meade Maw, MD;  Location: [facility];  Service: Neurosurgery;  Laterality: Right;  ? TEE WITHOUT CARDIOVERSION N/A 07/04/2019  ? Procedure: TRANSESOPHAGEAL ECHOCARDIOGRAM (TEE);  Surgeon: Nelva Bush, MD;  Location: [facility] OR[facility];  Service: Cardiovascular;  Laterality: N/A;  ? ?Family History  ?Problem Relation Age of Onset  ? Heart disease Mother   ? Clotting disorder Mother   ? Hypertension Sister   ? ?Social History  ? ?Socioeconomic History  ? Marital status: Married  ?  Spouse name: Harle Battiest  ? Number of children: 4  ? Years of education: Not on file  ? Highest education level: Some college, no degree  ?Occupational History  ? Occupation: retired  ?Tobacco Use  ? Smoking status: Former  ?  Packs/day: 1.00  ?  Years: 8.00  ?  Pack years: 8.00  ?  Types: Cigarettes  ?  Quit date: 12/31/1967  ?  Years since quitting: 53.6  ? Smokeless tobacco: Former  ?Vaping Use  ? Vaping Use: Never used   ?Substance and Sexual Activity  ? Alcohol use: No  ?  Alcohol/week: 0.0 standard drinks  ?  Comment: 05/06/2012 "last alcohol several years ago; never had problem wit"  ? Drug use: No  ? Sexual activity: Not

## 2021-08-12 ENCOUNTER — Other Ambulatory Visit: Payer: Self-pay | Admitting: Medical

## 2021-08-16 DIAGNOSIS — J310 Chronic rhinitis: Secondary | ICD-10-CM | POA: Diagnosis not present

## 2021-08-16 DIAGNOSIS — G4733 Obstructive sleep apnea (adult) (pediatric): Secondary | ICD-10-CM | POA: Diagnosis not present

## 2021-08-16 DIAGNOSIS — J449 Chronic obstructive pulmonary disease, unspecified: Secondary | ICD-10-CM | POA: Diagnosis not present

## 2021-08-16 DIAGNOSIS — R0609 Other forms of dyspnea: Secondary | ICD-10-CM | POA: Diagnosis not present

## 2021-08-16 DIAGNOSIS — I517 Cardiomegaly: Secondary | ICD-10-CM | POA: Diagnosis not present

## 2021-08-29 ENCOUNTER — Telehealth: Payer: Medicare Other

## 2021-08-29 ENCOUNTER — Ambulatory Visit (INDEPENDENT_AMBULATORY_CARE_PROVIDER_SITE_OTHER): Payer: Medicare Other | Admitting: Pharmacist

## 2021-08-29 DIAGNOSIS — I129 Hypertensive chronic kidney disease with stage 1 through stage 4 chronic kidney disease, or unspecified chronic kidney disease: Secondary | ICD-10-CM

## 2021-08-29 DIAGNOSIS — J432 Centrilobular emphysema: Secondary | ICD-10-CM

## 2021-08-29 DIAGNOSIS — E1142 Type 2 diabetes mellitus with diabetic polyneuropathy: Secondary | ICD-10-CM

## 2021-08-29 NOTE — Chronic Care Management (AMB) (Signed)
? ?Chronic Care Management ?CCM Pharmacy Note ? ?08/29/2021 ?Name:  Philip VIZZINI Sr. MRN:  [id number] DOB:  [birth year] ? ? ?Subjective: ?Philip Potters Sr. is an 76 y.o. year old adult who is a primary patient of [name], DO.  The CCM team was consulted for assistance with disease management and care coordination needs.   ? ?Engaged with patient by telephone for follow up visit for pharmacy case management and/or care coordination services.  ? ?Objective: ? ?Medications Reviewed Today   ? ? Reviewed by [name], RPH-CPP (Pharmacist) on 08/29/21 at 1524  Med List Status: <None>  ? ?Medication Order Taking? Sig Documenting Provider Last Dose Status Informant  ?acetaminophen (TYLENOL) 500 MG tablet [id number]  Take 1,000 mg by mouth every 8 (eight) hours as needed for moderate pain or mild pain. [provider]  Active Self  ?Albuterol Sulfate 108 (90 Base) MCG/ACT AEPB [id number]  Inhale 2 puffs into the lungs every 6 (six) hours as needed (shortness of breath).  [provider]  Active Self  ?apixaban (ELIQUIS) 5 MG TABS tablet [id number]  Take 5 mg by mouth 2 (two) times daily. [provider]  Active Self  ?ARTIFICIAL TEAR SOLUTION OP [id number]  Place 1 drop into both eyes daily. [provider]  Active Self  ?atorvastatin (LIPITOR) 40 MG tablet [id number]  Take 1 tablet by mouth once daily [name], MD  Active   ?Blood Glucose Monitoring Suppl (ONE TOUCH ULTRA 2) w/Device KIT [id number]  Use to check blood sugar as advised up to twice a day [name], DO  Active Self  ?calcium carbonate (TUMS - DOSED IN MG ELEMENTAL CALCIUM) 500 MG chewable tablet [id number]  Chew 1,000 mg by mouth daily as needed for indigestion or heartburn. [provider]  Active Self  ?cetirizine (ZYRTEC) 10 MG tablet [id number]  Take 10 mg by mouth 2 (two) times daily. [provider]  Active Self  ?EUTHYROX 88 MCG tablet [id number]  Take 1  tablet (88 mcg total) by mouth daily before breakfast. [name], DO  Active Self  ?FARXIGA 10 MG TABS tablet [id number] Yes Take 1 tablet (10 mg total) by mouth daily before breakfast. [name], DO Taking Active Self  ?fluticasone (FLONASE) 50 MCG/ACT nasal spray [id number]  Place 2 sprays into both nostrils daily. [name], DO  Active   ?furosemide (LASIX) 40 MG tablet [id number] Yes Take 0.5-1 tablets (20-40 mg total) by mouth daily as needed for edema. Only as needed now. [name], DO Taking Active Self  ?gabapentin (NEURONTIN) 100 MG capsule [id number]  Take 1 capsule (100 mg total) by mouth 3 (three) times daily. [name], DO  Active Self  ?ipratropium-albuterol (DUONEB) 0.5-2.5 (3) MG/3ML SOLN [id number]  Take 3 mLs by nebulization every 6 (six) hours as needed (shortness of breath). [provider]  Active Self  ?         ?Med Note Jilda Roche A   Fri Dec 24, 2020  4:04 PM)    ?[place] [id number]  1 Device by Does not apply route 4 (four) times daily -  before meals and at bedtime. [name], MD  Active Self  ?losartan (COZAAR) 25 MG tablet [id number] Yes Take 1 tablet (25 mg total) by mouth every morning. [name], MD Taking Active   ?metFORMIN (GLUCOPHAGE) 500 MG tablet [id number] Yes Take 2 tablets (1,000 mg total) by mouth 2 (  two) times daily with a meal. [name], [name], DO Taking Active Self  ?metoprolol tartrate (LOPRESSOR) 50 MG tablet [id number] Yes TAKE 1 & 1/2 (ONE & ONE-HALF) TABLETS BY MOUTH TWICE DAILY [name], MD Taking Active   ?montelukast (SINGULAIR) 10 MG tablet [id number] Yes Take 10 mg by mouth at bedtime. [provider] Taking Active Self  ?ONETOUCH ULTRA test strip [id number]  USE TO CHECK BLOOD SUGAR TWICE DAILY [name] [name], DO  Active   ?spironolactone (ALDACTONE) 25 MG tablet [id number] Yes Take 0.5 tablets (12.5 mg total) by mouth  daily. Kathlen Mody, [name], [name] Taking Active Self  ?SYMBICORT 160-4.5 MCG/ACT inhaler [id number] Yes Inhale 2 puffs 2 (two) times daily into the lungs.  [provider] Taking Active Self  ? ?  ?  ? ?  ? ? ?Pertinent Labs:  ?Lab Results  ?Component Value Date  ? HGBA1C 7.1 (H) 07/27/2021  ? ?Lab Results  ?Component Value Date  ? CHOL 117 07/27/2021  ? HDL 50 07/27/2021  ? LDLCALC 49 07/27/2021  ? TRIG 95 07/27/2021  ? CHOLHDL 2.3 07/27/2021  ? ?Lab Results  ?Component Value Date  ? CREATININE 1.13 07/27/2021  ? BUN 20 07/27/2021  ? NA 141 07/27/2021  ? K 4.3 07/27/2021  ? CL 106 07/27/2021  ? CO2 26 07/27/2021  ? ?BP Readings from Last 3 Encounters:  ?08/11/21 (!) 101/57  ?08/03/21 122/70  ?06/09/21 110/70  ? ?Pulse Readings from Last 3 Encounters:  ?08/11/21 60  ?08/03/21 84  ?06/09/21 66  ? ? ? ?SDOH:  (Social Determinants of Health) assessments and interventions performed:  ? ? ?CCM Care Plan ? ?Review of patient past medical history, allergies, medications, health status, including review of consultants reports, laboratory and other test data, was performed as part of comprehensive evaluation and provision of chronic care management services.  ? ?Care Plan : PharmD - Medication Management/Medication Assistance  ?Updates made by [name], RPH-CPP since 08/29/2021 12:00 AM  ?  ? ?Problem: Disease Progression   ?  ? ?Long-Range Goal: Disease Progression Prevented or Minimized   ?Start Date: 06/16/2020  ?Expected End Date: 09/14/2020  ?Recent Progress: On track  ?Priority: High  ?Note:   ?Current Barriers:  ?Knowledge deficits related to dietary choices to improve blood sugar control ?Financial - difficulty with affording Farxiga through Medicare Part D health plan ?Patient APPROVED for patient assistance for Farxiga from [employer] through 04/30/2022 ?Patient was previously APPROVED for patient assistance for Eliquis from [place] from 01/06/21-04/30/21 ? ?Pharmacist Clinical Goal(s):  ?Over the next 90 days,  patient will achieve control of T2DM as evidenced by A1C through collaboration with PharmD and provider.  ? ?Interventions: ?1:1 collaboration with [name], DO regarding development and update of comprehensive plan of care as evidenced by provider attestation and co-signature ?Inter-disciplinary care team collaboration (see longitudinal plan of care) ?Perform chart review ?Office Visit with PCP on 4/5 for annual exam and nasal congestion. Provider advised patient: ?Recommend OTC supplement with oral Vitamin B12 1025mg daily ?Zpak and Prednisone taper ordered for sinusitis ?[facility] ?Patient uses weekly pillbox ?Reports nasal symptoms improved after completing courses of Zpak and prednisone as directed by PCP ?Reports working on building strength in legs. Doing home exercises and increasing walking ? ?Type 2 Diabetes ?Controlled; current treatment: ?metformin 500 mg - 2 tablets (1,000 mg) twice daily as directed ?Farxiga 10 mg - 1 tablet QAM   ?Reports most recent blood  sugar results: ? Fasting Blood Glucose After Supper/Bedtime  ?25 - April 195 178  ?26 - April 148 179  ?27 - April 142 167  ?28 - April 142 152  ?29 - April 131 184  ?30 - April 137 -  ?1 - May 172   ?Attributes a couple of recent elevations in evening blood sugar to eating larger carbohydrate portion size when eating out ?Counsel on importance of continuing to control carbohydrate portion sizes and using blood sugar checks to reinforce dietary choices ?Exercise: Reports walking 15-30 minutes most days of the week ? ?CHF/HTN ?Current treatment: ?Losartan 25 mg - 1 tablet daily ?Metoprolol 75 mg twice daily ?Spironolactone 25 mg - 1/2 tablet (12.5 mg) daily  ?Farxiga 10 mg - 1 tablets daily ?Furosemide 40 mg daily as needed as directed by Cardiologist ?Reports recent home blood pressure readings: ? AM BP PM BP  ?25 - April 114/78, HR 70 109/74, HR 79  ?26 - April 131/84, HR 70 102/72, HR 76   ?27 - April 107/74, HR 74 95/74, HR 79  ?28 - April 118/82, HR 84 98/71, HR 80  ?29 - April 97/71, HR 85 107/73, HR 90  ?30 - April 112/74, HR 72 -  ?1 - May 125/95, HR 60   ? ?Denies symptoms of hypotensi

## 2021-08-29 NOTE — Patient Instructions (Signed)
Visit Information ? ?Thank you for taking time to visit with me today. Please don't hesitate to contact me if I can be of assistance to you before our next scheduled telephone appointment. ? ?Following are the goals we discussed today:  ? Goals Addressed   ? ?  ?  ?  ?  ? This Visit's Progress  ?  Pharmacy Goals     ?  Our goal A1c is less than 7%. This corresponds with fasting sugars less than 130 and 2 hour after meal sugars less than 180. Please check your blood sugar and keep log of results ? ?Please continue to monitor your home weight, blood pressure and heart rate ? ?Our goal bad cholesterol, or LDL, is less than 70 . This is why it is important to continue taking your atorvastatin ? ?Feel free to call me with any questions or concerns. I look forward to our next call! ? ? ?Wallace Cullens, PharmD, BCACP ?Clinical Pharmacist ?[facility] ?[place] ?[phone] ? ?  ? ?  ? ? ? ?Our next appointment is by telephone on 6/9 at 9:15 am ? ?Please call the care guide team at [phone] if you need to cancel or reschedule your appointment.  ? ? ?Patient verbalizes understanding of instructions and care plan provided today and agrees to view in [place]. Active MyChart status confirmed with patient.   ? ?

## 2021-09-15 ENCOUNTER — Other Ambulatory Visit: Payer: Self-pay | Admitting: Family Medicine

## 2021-09-15 DIAGNOSIS — E1142 Type 2 diabetes mellitus with diabetic polyneuropathy: Secondary | ICD-10-CM

## 2021-09-15 NOTE — Telephone Encounter (Signed)
Requested Prescriptions  Pending Prescriptions Disp Refills  . ONETOUCH ULTRA test strip [employer] Med Name: OneTouch Ultra Blue In Vitro Strip] 100 each 0    Sig: USE TO CHECK BLOOD SUGAR TWICE DAILY     Endocrinology: Diabetes - Testing Supplies Passed - 09/15/2021  1:51 PM      Passed - Valid encounter within last 12 months    Recent Outpatient Visits          1 month ago Annual physical exam   [facility] [name], DO   1 month ago No-show for appointment   [facility] [name], DO   7 months ago Type 2 diabetes mellitus with peripheral neuropathy [facility])   [facility] [name], DO   1 year ago Type 2 diabetes mellitus with hyperlipidemia [facility])   [place], DO   1 year ago Acute idiopathic gout of right ankle   [place], [name], [place]

## 2021-09-28 ENCOUNTER — Other Ambulatory Visit: Payer: Self-pay | Admitting: Family

## 2021-09-28 DIAGNOSIS — I110 Hypertensive heart disease with heart failure: Secondary | ICD-10-CM

## 2021-09-28 DIAGNOSIS — Z7984 Long term (current) use of oral hypoglycemic drugs: Secondary | ICD-10-CM | POA: Diagnosis not present

## 2021-09-28 DIAGNOSIS — J449 Chronic obstructive pulmonary disease, unspecified: Secondary | ICD-10-CM

## 2021-09-28 DIAGNOSIS — Z87891 Personal history of nicotine dependence: Secondary | ICD-10-CM

## 2021-09-28 DIAGNOSIS — I509 Heart failure, unspecified: Secondary | ICD-10-CM

## 2021-09-28 DIAGNOSIS — E1159 Type 2 diabetes mellitus with other circulatory complications: Secondary | ICD-10-CM | POA: Diagnosis not present

## 2021-09-29 NOTE — Telephone Encounter (Signed)
Prescription refill request for Eliquis received. Indication:Afib Last office visit:2/23 Scr:1.1 Age: 76 Weight:87.1 kg  Prescription refilled

## 2021-10-07 ENCOUNTER — Ambulatory Visit (INDEPENDENT_AMBULATORY_CARE_PROVIDER_SITE_OTHER): Payer: Medicare Other | Admitting: Pharmacist

## 2021-10-07 DIAGNOSIS — I129 Hypertensive chronic kidney disease with stage 1 through stage 4 chronic kidney disease, or unspecified chronic kidney disease: Secondary | ICD-10-CM

## 2021-10-07 DIAGNOSIS — J432 Centrilobular emphysema: Secondary | ICD-10-CM

## 2021-10-07 DIAGNOSIS — E1142 Type 2 diabetes mellitus with diabetic polyneuropathy: Secondary | ICD-10-CM
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
## 2021-10-07 NOTE — Patient Instructions (Signed)
Visit Information  Thank you for taking time to visit with me today. Please don't hesitate to contact me if I can be of assistance to you before our next scheduled telephone appointment.  Following are the goals we discussed today:   Goals Addressed             This Visit's Progress    Pharmacy Goals       Our goal A1c is less than 7%. This corresponds with fasting sugars less than 130 and 2 hour after meal sugars less than 180. Please check your blood sugar and keep log of results  Please continue to monitor your home weight, blood pressure and heart rate  Our goal bad cholesterol, or LDL, is less than 70 . This is why it is important to continue taking your atorvastatin  Feel free to call me with any questions or concerns. I look forward to our next call!  Wallace Cullens, PharmD, [place] [phone]          Our next appointment is by telephone on 11/11/2021 at 9:45 AM  Please call the care guide team at [phone] if you need to cancel or reschedule your appointment.    Patient verbalizes understanding of instructions and care plan provided today and agrees to view in [place]. Active MyChart status and patient understanding of how to access instructions and care plan via MyChart confirmed with patient.

## 2021-10-11 IMAGING — DX DG CHEST 1V PORT
1 series · 1 of 1 positions shown · non-contrast
Comparison: 05/08/2016

CLINICAL DATA: Short of breath, tachycardia

EXAM:
PORTABLE CHEST 1 VIEW

[chest ap]
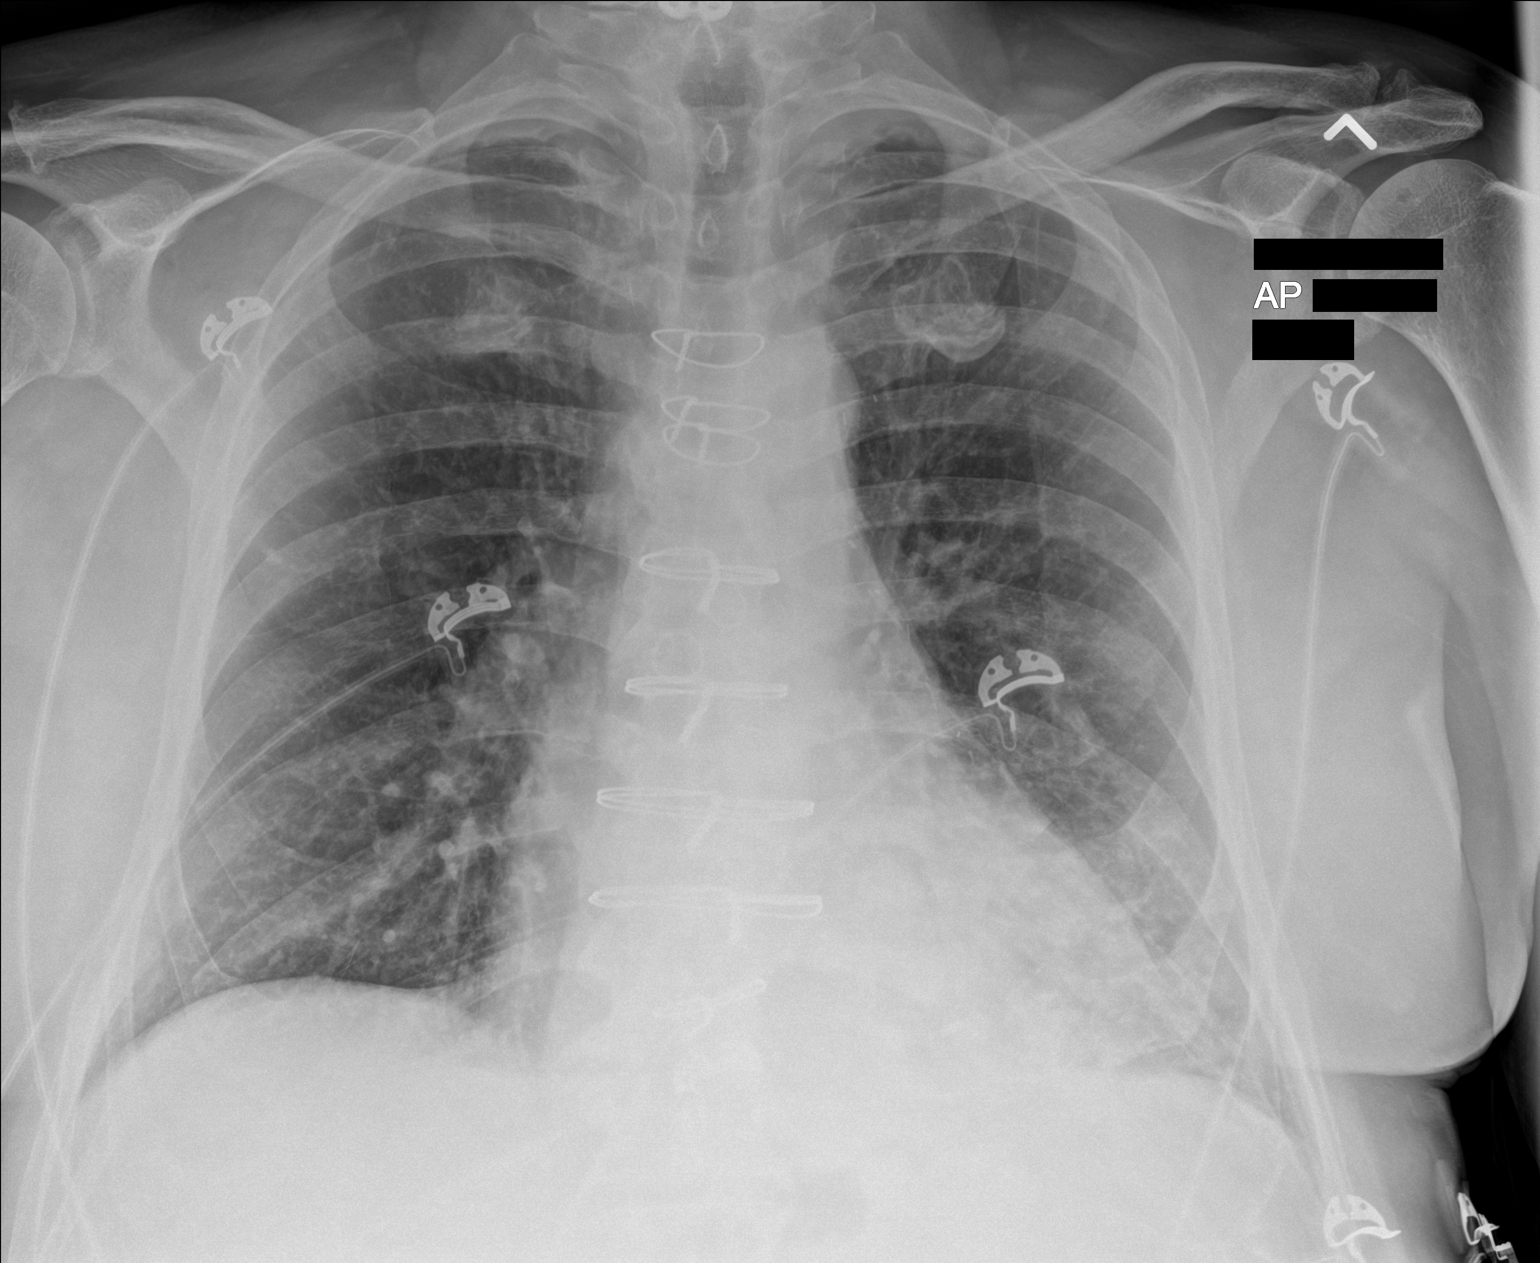

[1 of 1 positions shown; findings below may reference images not displayed]

FINDINGS: Single frontal view of the chest demonstrates mild enlargement the
cardiac silhouette. Stable postsurgical changes from CABG. There is
mild central vascular congestion. Left lower lobe consolidation is
noted. No effusion or pneumothorax.
IMPRESSION: 1. Left lower lobe consolidation which may reflect atelectasis or
airspace disease.
2. Mild central vascular congestion.

## 2021-10-25 ENCOUNTER — Other Ambulatory Visit: Payer: Self-pay | Admitting: Cardiovascular Disease

## 2021-10-28 DIAGNOSIS — I509 Heart failure, unspecified: Secondary | ICD-10-CM | POA: Diagnosis not present

## 2021-10-28 DIAGNOSIS — I110 Hypertensive heart disease with heart failure: Secondary | ICD-10-CM

## 2021-10-28 DIAGNOSIS — E1159 Type 2 diabetes mellitus with other circulatory complications: Secondary | ICD-10-CM

## 2021-10-28 DIAGNOSIS — Z7984 Long term (current) use of oral hypoglycemic drugs: Secondary | ICD-10-CM | POA: Diagnosis not present

## 2021-10-28 DIAGNOSIS — J449 Chronic obstructive pulmonary disease, unspecified: Secondary | ICD-10-CM

## 2021-11-08 NOTE — Progress Notes (Unsigned)
Cardiology Office Note:    Date:  11/09/2021   ID:  Philip Potters Sr., DOB 1945/11/04, MRN [id number]  PCP:  [name], DO  [facility] Cardiologist:  [name], MD  [facility] Electrophysiologist:  None   Referring MD: [name] *   Chief Complaint: weakness  History of Present Illness:    Philip Potters Sr. is a 76 y.o. adult with a hx of CAD s/p CABG, chronic afib, atrial tachycardia who presents for follow-up.  The patient underwent CABG in January 2014. He had post-op afib which was treated with amiodarone.   He was hospitalized in February 2021 with sepsis secondary to PNA and MSSA bacteremia complicated by atrial flutter with RVR. Echo showed LVEF 40-45% with global HK, mildly reduced RV function, moderate pulmonary HTN, mild to moderate MR. TEE showed an EF of 30-35% with no evidence of vegetations.   HE had a Lexiscan Myoview in March of 2021 which showed no evidence of ischemia. Echo in October 2022 showed an EF 40-45% with normal LV function.   Last seen 06/2021 and was doing well from a cardiac standpoint.   Today, the patient reports neck and upper back weakness. It started after back and knee replacement in October and November last year. He did some PT, although he feels he didn't get his strength back. Weakness is worse with exertion. Says he felt this way prior to his previous heart attack. He has chronic SOB from COPD. No LLE, orthopnea, or pnd.    Past Medical History:  Diagnosis Date   (HFpEF) heart failure with preserved ejection fraction ([place])    a. 06/2015 Echo: EF 50-55%, Gr1 DD. Mild MR. Mildly dil LA. Nl RV fxn. Nl PASP.   Arthritis    a. right knee   Asthma    Atrial tachycardia (HCC)    Chronic anticoagulation    Apixaban   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 etiology.   COPD (chronic obstructive pulmonary disease) (HCC)    Coronary artery disease    a. 05/2012 Cath: severe 3VD-->CABG x 3 by Dr. Servando Snare  in 01/07/2014[place], SVG->LCX & SVG->RPDA; b. 06/2014 MV: no ischemia/infarct.   DDD (degenerative disc disease), lumbar    Dyspnea    Edema    LEGS/ FEET   Essential hypertension    History of Helicobacter pylori infection    HNP (herniated nucleus pulposus), lumbar    HOH (hard of hearing)    Hypercholesteremia    Hypothyroidism    Low back pain radiating to lower extremity    Lumbar radiculitis    Lumbar stenosis with neurogenic claudication    Neuropathy    PAF (paroxysmal atrial fibrillation) (HCC)    a. CHA2DS2VASc = 5 (age, CHF, HTN, vascular disase, T2DM) --> Eliquis.   Sepsis ([place]) 2021   Shingles    Sleep apnea    NO CPAP   Type II diabetes mellitus [facility])     Past Surgical History:  Procedure Laterality Date   ANTERIOR CERVICAL DECOMP/DISCECTOMY FUSION  ~ 2009   CARDIAC CATHETERIZATION  05/06/2012   Significant three-vessel coronary artery disease with normal ejection fraction   CATARACT EXTRACTION W/PHACO Right 08/15/2017   Procedure: CATARACT EXTRACTION PHACO AND INTRAOCULAR LENS PLACEMENT ([place]);  Surgeon: Birder Robson, MD;  Location: [facility];  Service: Ophthalmology;  Laterality: Right;  [place] 00:23 AP% 13.3 CDE 3.11 Fluid pack lot # [id number]   CATARACT EXTRACTION W/PHACO Left 10/03/2017  Procedure: CATARACT EXTRACTION PHACO AND INTRAOCULAR LENS PLACEMENT (I[facility]);  Surgeon: Birder Robson, MD;  Location: [facility];  Service: Ophthalmology;  Laterality: Left;  Lot #[id number] H [place]: 00:21.2 AP%:13.9 CDE: 2.96   CORONARY ARTERY BYPASS GRAFT  05/07/2012   Procedure: CORONARY ARTERY BYPASS GRAFTING (CABG);  Surgeon: [name], MD;  Location: [place];  Service: Open Heart Surgery;  Laterality: N/A;  times three   INTRAOPERATIVE TRANSESOPHAGEAL ECHOCARDIOGRAM  05/07/2012   Procedure: INTRAOPERATIVE TRANSESOPHAGEAL ECHOCARDIOGRAM;  Surgeon: [name], MD;  Location: [place];  Service: Open Heart Surgery;  Laterality: N/A;   KNEE ARTHROPLASTY Right  03/07/2021   Procedure: COMPUTER ASSISTED TOTAL RIGHT KNEE ARTHROPLASTY -;  Surgeon: Dereck Leep, MD;  Location: [facility];  Service: Orthopedics;  Laterality: Right;   KNEE ARTHROSCOPY Right ~ 2000   KNEE SURGERY Right 2003   LUMBAR LAMINECTOMY/DECOMPRESSION MICRODISCECTOMY Right 01/07/2021   Procedure: RIGHT L3-4 MICRODISCECTOMY 1 LEVEL;  Surgeon: Meade Maw, MD;  Location: [facility];  Service: Neurosurgery;  Laterality: Right;   TEE WITHOUT CARDIOVERSION N/A 07/04/2019   Procedure: TRANSESOPHAGEAL ECHOCARDIOGRAM (TEE);  Surgeon: Nelva Bush, MD;  Location: [facility];  Service: Cardiovascular;  Laterality: N/A;    Current Medications: Current Meds  Medication Sig   acetaminophen (TYLENOL) 500 MG tablet Take 1,000 mg by mouth every 8 (eight) hours as needed for moderate pain or mild pain.   Albuterol Sulfate 108 (90 Base) MCG/ACT AEPB Inhale 2 puffs into the lungs every 6 (six) hours as needed (shortness of breath).    apixaban (ELIQUIS) 5 MG TABS tablet Take 1 tablet by mouth twice daily   ARTIFICIAL TEAR SOLUTION OP Place 1 drop into both eyes daily.   atorvastatin (LIPITOR) 40 MG tablet Take 1 tablet by mouth once daily   Blood Glucose Monitoring Suppl (ONE TOUCH ULTRA 2) w/Device KIT Use to check blood sugar as advised up to twice a day   calcium carbonate (TUMS - DOSED IN MG ELEMENTAL CALCIUM) 500 MG chewable tablet Chew 1,000 mg by mouth daily as needed for indigestion or heartburn.   cetirizine (ZYRTEC) 10 MG tablet Take 10 mg by mouth 2 (two) times daily.   EUTHYROX 88 MCG tablet Take 1 tablet (88 mcg total) by mouth daily before breakfast.   FARXIGA 10 MG TABS tablet Take 1 tablet (10 mg total) by mouth daily before breakfast.   fluticasone (FLONASE) 50 MCG/ACT nasal spray Place 2 sprays into both nostrils daily.   furosemide (LASIX) 40 MG tablet Take 0.5-1 tablets (20-40 mg total) by mouth daily as needed for edema. Only as needed now.   gabapentin (NEURONTIN) 100 MG  capsule Take 1 capsule (100 mg total) by mouth 3 (three) times daily.   ipratropium-albuterol (DUONEB) 0.5-2.5 (3) MG/3ML SOLN Take 3 mLs by nebulization every 6 (six) hours as needed (shortness of breath).   LANCETS ULTRA THIN MISC 1 Device by Does not apply route 4 (four) times daily -  before meals and at bedtime.   losartan (COZAAR) 25 MG tablet Take 1 tablet (25 mg total) by mouth every morning.   metFORMIN (GLUCOPHAGE) 500 MG tablet Take 2 tablets (1,000 mg total) by mouth 2 (two) times daily with a meal.   metoprolol tartrate (LOPRESSOR) 50 MG tablet TAKE 1 & 1/2 (ONE & ONE-HALF) TABLETS BY MOUTH TWICE DAILY   montelukast (SINGULAIR) 10 MG tablet Take 10 mg by mouth at bedtime.   ONETOUCH ULTRA test strip USE TO CHECK BLOOD SUGAR TWICE DAILY  spironolactone (ALDACTONE) 25 MG tablet Take 0.5 tablets (12.5 mg total) by mouth daily.   SYMBICORT 160-4.5 MCG/ACT inhaler Inhale 2 puffs 2 (two) times daily into the lungs.      Allergies:   Bydureon [exenatide], Clindamycin/lincomycin, Penicillins, Sulfa antibiotics, Tiotropium, Penicillin g, Spiriferis, Spiriva handihaler [tiotropium bromide monohydrate], Iodinated contrast media, and Other   Social History   Socioeconomic History   Marital status: Married    Spouse name: Harle Battiest   Number of children: 4   Years of education: Not on file   Highest education level: Some college, no degree  Occupational History   Occupation: retired  Tobacco Use   Smoking status: Former    Packs/day: 1.00    Years: 8.00    Total pack years: 8.00    Types: Cigarettes    Quit date: 12/31/1967    Years since quitting: 53.8   Smokeless tobacco: Former  Scientific laboratory technician Use: Never used  Substance and Sexual Activity   Alcohol use: No    Alcohol/week: 0.0 standard drinks of alcohol    Comment: 05/06/2012 "last alcohol several years ago; never had problem wit"   Drug use: No   Sexual activity: Not Currently  Other Topics Concern   Not on file   Social History Narrative   ** Merged History Encounter **          Working part time - not currently working because of covid-19    Social Determinants of Radio broadcast assistant Strain: [place]  (08/11/2021)   Overall Financial Resource Strain (CARDIA)    Difficulty of Paying Living Expenses: Not hard at all  Food Insecurity: No Food Insecurity (08/11/2021)   Hunger Vital Sign    Worried About Running Out of Food in the Last Year: Never true    [employer] in the Last Year: Never true  Transportation Needs: No Transportation Needs (08/11/2021)   PRAPARE - Hydrologist (Medical): No    Lack of Transportation (Non-Medical): No  Physical Activity: Insufficiently Active (08/11/2021)   Exercise Vital Sign    Days of Exercise per Week: 7 days    Minutes of Exercise per Session: 10 min  Stress: No Stress Concern Present (08/11/2021)   [place]    Feeling of Stress : Not at all  Social Connections: [place] (08/11/2021)   Social Connection and Isolation Panel [NHANES]    Frequency of Communication with Friends and Family: More than three times a week    Frequency of Social Gatherings with Friends and Family: More than three times a week    Attends Religious Services: More than 4 times per year    Active Member of [employer] or Organizations: Yes    Attends Music therapist: More than 4 times per year    Marital Status: Married     Family History: The patient's family history includes Clotting disorder in his mother; Heart disease in his mother; Hypertension in his sister.  ROS:   Please see the history of present illness.     All other systems reviewed and are negative.  EKGs/Labs/Other Studies Reviewed:    The following studies were reviewed today:  Echo limited 01/2021  1. Left ventricular ejection fraction, by estimation, is 40 to 45%. The  left  ventricle has mildly decreased function. The left ventricle  demonstrates global hypokinesis. Left ventricular diastolic parameters are  indeterminate.  2. Right ventricular systolic function is normal. The right ventricular  size is normal.   3. The mitral valve is normal in structure. Trivial mitral valve  regurgitation. No evidence of mitral stenosis.   4. The aortic valve is normal in structure. Aortic valve regurgitation is  not visualized. No aortic stenosis is present.   5. The inferior vena cava is normal in size with greater than 50%  respiratory variability, suggesting right atrial pressure of 3 mmHg.   Echo 08/2019   1. Left ventricular ejection fraction, by estimation, is 40 to 45%. The  left ventricle has mildly decreased function. The left ventricle  demonstrates global hypokinesis. Left ventricular diastolic parameters are  indeterminate.   2. Right ventricular systolic function is normal. The right ventricular  size is normal. Tricuspid regurgitation signal is inadequate for assessing  PA pressure.   3. Left atrial size was moderately dilated.   4. The mitral valve is normal in structure. Moderate mitral valve  regurgitation. No evidence of mitral stenosis.   5. Tricuspid valve regurgitation is mild to moderate.   Myoview Lexiscan 06/2019 Narrative & Impression  The study is normal. This is a low risk study. The left ventricular ejection fraction is normal (63%). There is no evidence for ischemia    EKG:  EKG is  ordered today.  The ekg ordered today demonstrates Afib, 70bpm, LAD, nonspecific T wave changes  Recent Labs: 07/27/2021: ALT 19; BUN 20; Creat 1.13; Hemoglobin 13.5; Platelets 222; Potassium 4.3; Sodium 141; TSH 5.82  Recent Lipid Panel    Component Value Date/Time   CHOL 117 07/27/2021 0756   CHOL 139 12/14/2014 0834   TRIG 95 07/27/2021 0756   HDL 50 07/27/2021 0756   HDL 43 12/14/2014 0834   CHOLHDL 2.3 07/27/2021 0756   VLDL 20 11/21/2019  0915   LDLCALC 49 07/27/2021 0756   Physical Exam:    VS:  BP 100/70 (BP Location: Left Arm, Patient Position: Sitting, Cuff Size: Normal)   Pulse 70   Ht 5' 11" (1.803 m)   Wt 186 lb 3.2 oz (84.5 kg)   SpO2 97%   BMI 25.97 kg/m     Wt Readings from Last 3 Encounters:  11/09/21 186 lb 3.2 oz (84.5 kg)  08/11/21 192 lb (87.1 kg)  08/03/21 192 lb 3.2 oz (87.2 kg)     GEN:  Well nourished, well developed in no acute distress HEENT: Normal NECK: No JVD; No carotid bruits LYMPHATICS: No lymphadenopathy CARDIAC: RRR, no murmurs, rubs, gallops RESPIRATORY:  Clear to auscultation without rales, wheezing or rhonchi  ABDOMEN: Soft, non-tender, non-distended MUSCULOSKELETAL:  No edema; No deformity  SKIN: Warm and dry NEUROLOGIC:  Alert and oriented x 3 PSYCHIATRIC:  Normal affect   ASSESSMENT:    1. Coronary artery disease involving native coronary artery of native heart with other form of angina pectoris (HCC)   2. AF (paroxysmal atrial fibrillation) ([place])   3. Chronic systolic heart failure ([place])   4. Hyperlipidemia, mixed    PLAN:    In order of problems listed above:  Atypical chest pain CAD He reports neck and shoulder weakness that feels worse on exertion since surgeries last fall. Overall low suspicion for cardiac etiology. I recommended PT or general increase in activity with stretching. Wife reports he is very sedentary. He had a normal Myoview in 2021. We will see him back in 3 months, if he still has symptoms at that time, can consider repeat Myoview Lexiscan. Continue Lipitor  and Lopressor. No ASA with Eliquis.  Permanent Afib EKG shows rate controlled afib. Continue stroke ppx with Eliquis 90m BID. Continue rate control with Lopressor 777mBID.  Chronic systolic heart failure Most recent echo showed LVEF 40-45%, trivial MR. He is euvolemic on exam . He takes lasix as needed. Continue Losartan, Lopressor, Farxiga and spironolactone.   HLD LDL 49. Continue  Lipitor 4069maily.   Disposition: Follow up in 3 month(s) with MD/APP    Signed, [name] FNinfa MeekerA-C  11/09/2021 8:41 AM    [facility]

## 2021-11-09 ENCOUNTER — Encounter: Payer: Self-pay | Admitting: Medical

## 2021-11-09 ENCOUNTER — Ambulatory Visit: Payer: Medicare Other | Admitting: Medical

## 2021-11-09 VITALS — BP 100/70 | HR 70 | Ht 71.0 in | Wt 186.2 lb

## 2021-11-09 DIAGNOSIS — E782 Mixed hyperlipidemia: Secondary | ICD-10-CM | POA: Diagnosis not present

## 2021-11-09 DIAGNOSIS — I25118 Atherosclerotic heart disease of native coronary artery with other forms of angina pectoris: Secondary | ICD-10-CM

## 2021-11-09 DIAGNOSIS — I480 Paroxysmal atrial fibrillation: Secondary | ICD-10-CM

## 2021-11-09 DIAGNOSIS — I5022 Chronic systolic (congestive) heart failure: Secondary | ICD-10-CM | POA: Diagnosis not present

## 2021-11-09 NOTE — Patient Instructions (Signed)
Medication Instructions:  ?Your physician recommends that you continue on your current medications as directed. Please refer to the Current Medication list given to you today. ? ?*If you need a refill on your cardiac medications before your next appointment, please call your pharmacy* ? ? ?Lab Work: ?None ordered ?If you have labs (blood work) drawn today and your tests are completely normal, you will receive your results only by: ?MyChart Message (if you have MyChart) OR ?A paper copy in the mail ?If you have any lab test that is abnormal or we need to change your treatment, we will call you to review the results. ? ? ?Testing/Procedures: ?None ordered ? ? ?Follow-Up: ?At [facility], you and your health needs are our priority.  As part of our continuing mission to provide you with exceptional heart care, we have created designated Provider Care Teams.  These Care Teams include your primary Cardiologist (physician) and Advanced Practice Providers (APPs -  Physician Assistants and Nurse Practitioners) who all work together to provide you with the care you need, when you need it. ? ?We recommend signing up for the patient portal called "MyChart".  Sign up information is provided on this After Visit Summary.  MyChart is used to connect with patients for Virtual Visits (Telemedicine).  Patients are able to view lab/test results, encounter notes, upcoming appointments, etc.  Non-urgent messages can be sent to your provider as well.   ?To learn more about what you can do with MyChart, go to [email or web address].   ? ?Your next appointment:   ?3 month(s) ? ?The format for your next appointment:   ?In Person ? ?Provider:   ?You may see Muhammad Arida, MD or one of the following Advanced Practice Providers on your designated Care Team:   ?Christopher Berge, NP ?Ryan Dunn, [name] ?Cadence Furth, [name] ? ? ?Other Instructions ?N/A ? ?Important Information About Sugar ? ? ? ? ? ? ?

## 2021-11-11 ENCOUNTER — Ambulatory Visit (INDEPENDENT_AMBULATORY_CARE_PROVIDER_SITE_OTHER): Payer: Medicare Other | Admitting: Pharmacist

## 2021-11-11 DIAGNOSIS — E1142 Type 2 diabetes mellitus with diabetic polyneuropathy: Secondary | ICD-10-CM

## 2021-11-11 DIAGNOSIS — J432 Centrilobular emphysema: Secondary | ICD-10-CM

## 2021-11-11 DIAGNOSIS — I129 Hypertensive chronic kidney disease with stage 1 through stage 4 chronic kidney disease, or unspecified chronic kidney disease: Secondary | ICD-10-CM

## 2021-11-11 NOTE — Chronic Care Management (AMB) (Signed)
Chronic Care Management CCM Pharmacy Note  11/11/2021 Name:  EATHON VALADE Sr. MRN:  [id number] DOB:  1945/08/12   Subjective: [name] is an 76 y.o. year old adult who is a primary patient of [name], DO.  The CCM team was consulted for assistance with disease management and care coordination needs.    Engaged with patient by telephone for follow up visit for pharmacy case management and/or care coordination services.   Objective:  Medications Reviewed Today     Reviewed by Quentin Angst, CMA (Certified Medical Assistant) on 11/09/21 at [phone]  Med List Status: <None>   Medication Order Taking? Sig Documenting Provider Last Dose Status Informant  acetaminophen (TYLENOL) 500 MG tablet [id number] Yes Take 1,000 mg by mouth every 8 (eight) hours as needed for moderate pain or mild pain. [provider] Taking Active Self  Albuterol Sulfate 108 (90 Base) MCG/ACT AEPB [id number] Yes Inhale 2 puffs into the lungs every 6 (six) hours as needed (shortness of breath).  [provider] Taking Active Self  apixaban (ELIQUIS) 5 MG TABS tablet [id number] Yes Take 1 tablet by mouth twice daily Loel Dubonnet, NP Taking Active   ARTIFICIAL TEAR SOLUTION OP [id number] Yes Place 1 drop into both eyes daily. [provider] Taking Active Self  atorvastatin (LIPITOR) 40 MG tablet [id number] Yes Take 1 tablet by mouth once daily [name], MD Taking Active   Blood Glucose Monitoring Suppl (ONE TOUCH ULTRA 2) w/Device KIT [id number] Yes Use to check blood sugar as advised up to twice a day [name], DO Taking Active Self  calcium carbonate (TUMS - DOSED IN MG ELEMENTAL CALCIUM) 500 MG chewable tablet [id number] Yes Chew 1,000 mg by mouth daily as needed for indigestion or heartburn. [provider] Taking Active Self  cetirizine (ZYRTEC) 10 MG tablet [id number] Yes Take 10 mg by mouth 2 (two) times daily. [provider] Taking Active Self  EUTHYROX 88 MCG tablet [id number] Yes Take 1 tablet (88 mcg total) by mouth daily before breakfast. [name], DO Taking Active Self  FARXIGA 10 MG TABS tablet [id number] Yes Take 1 tablet (10 mg total) by mouth daily before breakfast. [name], DO Taking Active Self  fluticasone (FLONASE) 50 MCG/ACT nasal spray [id number] Yes Place 2 sprays into both nostrils daily. [name], DO Taking Active   furosemide (LASIX) 40 MG tablet [id number] Yes Take 0.5-1 tablets (20-40 mg total) by mouth daily as needed for edema. Only as needed now. [name], DO Taking Active Self  gabapentin (NEURONTIN) 100 MG capsule [id number] Yes Take 1 capsule (100 mg total) by mouth 3 (three) times daily. [name], DO Taking Active Self  ipratropium-albuterol (DUONEB) 0.5-2.5 (3) MG/3ML SOLN [id number] Yes Take 3 mLs by nebulization every 6 (six) hours as needed (shortness of breath). [provider] Taking Active Self           Med Note Jilda Roche A   Fri Dec 24, 2020  4:04 PM)    [place] [id number] Yes 1 Device by Does not apply route 4 (four) times daily -  before meals and at bedtime. [name], MD Taking Active Self  losartan (COZAAR) 25 MG tablet [id number] Yes Take 1 tablet (25 mg total) by mouth every morning. [name], MD Taking Active   metFORMIN (GLUCOPHAGE) 500 MG tablet [id number] Yes Take 2 tablets (1,000 mg total) by mouth  2 (two) times daily with a meal. [name], [name], DO Taking Active Self  metoprolol tartrate (LOPRESSOR) 50 MG tablet [id number] Yes TAKE 1 & 1/2 (ONE & ONE-HALF) TABLETS BY MOUTH TWICE DAILY [name], MD Taking Active   montelukast (SINGULAIR) 10 MG tablet [id number] Yes Take 10 mg by mouth at bedtime. [provider] Taking Active Self  Donald Siva test strip [id number] Yes USE TO CHECK BLOOD SUGAR TWICE DAILY [name] [name], DO Taking Active   spironolactone (ALDACTONE) 25 MG tablet [id number] Yes Take 0.5 tablets (12.5 mg total) by mouth daily. Kathlen Mody, [name], [name] Taking Active Self  SYMBICORT 160-4.5 MCG/ACT inhaler [id number] Yes Inhale 2 puffs 2 (two) times daily into the lungs.  [provider] Taking Active Self            Pertinent Labs:  Lab Results  Component Value Date   HGBA1C 7.1 (H) 07/27/2021   Lab Results  Component Value Date   CHOL 117 07/27/2021   HDL 50 07/27/2021   LDLCALC 49 07/27/2021   TRIG 95 07/27/2021   CHOLHDL 2.3 07/27/2021   Lab Results  Component Value Date   CREATININE 1.13 07/27/2021   BUN 20 07/27/2021   NA 141 07/27/2021   K 4.3 07/27/2021   CL 106 07/27/2021   CO2 26 07/27/2021   BP Readings from Last 3 Encounters:  11/09/21 100/70  08/11/21 (!) 101/57  08/03/21 122/70   Pulse Readings from Last 3 Encounters:  11/09/21 70  08/11/21 60  08/03/21 84    SDOH:  (Social Determinants of Health) assessments and interventions performed:    [place]  Review of patient past medical history, allergies, medications, health status, including review of consultants reports, laboratory and other test data, was performed as part of comprehensive evaluation and provision of chronic care management services.   Care Plan : PharmD - Medication Management/Medication Assistance  Updates made by [name], RPH-CPP since 11/11/2021 12:00 AM     Problem: Disease Progression      Long-Range Goal: Disease Progression Prevented or Minimized   Start Date: 06/16/2020  Expected End Date: 09/14/2020  Recent Progress: On track  Priority: High  Note:   Current Barriers:  Knowledge deficits related to dietary choices to improve blood sugar control Financial - difficulty with affording [name] through Medicare Part D health plan Patient APPROVED for patient assistance for Farxiga from [employer] through 04/30/2022 Patient was previously APPROVED for  patient assistance for Eliquis from [place] from 01/06/21-04/30/21  Pharmacist Clinical Goal(s):  Over the next 90 days, patient will achieve control of T2DM as evidenced by A1C through collaboration with PharmD and provider.   Interventions: 1:1 collaboration with [name], DO regarding development and update of comprehensive plan of care as evidenced by provider attestation and co-signature Inter-disciplinary care team collaboration (see longitudinal plan of care) Perform chart review. Patient seen for [facility] with [facility] on 7/12 Today reports has started doing exercises previously received from physical therapy to increase his activity  Medication Assistance: Today patient reports he believes he has met/close to meeting out of pocket expenditure requirement for Eliquis patient assistance program  Advise patient to obtain copies of out of pocket expense reports (for himself and spouse) for calendar year Will collaborate with [place] Simcox to request her aid to patient with applying for Eliquis patient assistance from [employer] for this calendar year  COPD/seasonal allergies: Patient follows with [facility]. [facility]ul [facility] at [facility] Pulmonology Current treatment: Symbicort 160/4.5 two puffs bid Montelukast 10 mg QHS Albuterol/ipratropium nebulizer solution/ albuterol inhaler as needed Cetirizine 10 mg daily Flonase nasal spray as needed Using saline Navage pods Reports using maintenance (Symbicort) inhaler twice daily consistently and rinsing mouth out after each use and using rescue inhaler (albuterol) as needed Have discussed rational for using CPAP nightly and encouraged patient to restart as recommended by Pulmonologist Today reports his breathing has improves since we last spoke. Reports working on staying indoors to avoid humidity  Type 2 Diabetes Controlled; current treatment: metformin 500 mg - 2 tablets (1,000 mg) twice daily as directed Farxiga  10 mg - 1 tablet QAM   Reports most recent blood sugar results:  Fasting Blood Glucose After Supper/Bedtime   8 - July 121 169   9 - July - -   10 - July 114 180   11 - July 117 187   12 - July 136 222* *taken after eating piece of cherry cheesecake  13 - July 119 -   14 - July -    Have counseled on importance of continuing to control carbohydrate portion sizes and using blood sugar checks to reinforce dietary choices  CHF/HTN Current treatment: Losartan 25 mg - 1 tablet daily Metoprolol 75 mg twice daily Spironolactone 25 mg - 1/2 tablet (12.5 mg) daily  Farxiga 10 mg - 1 tablets daily Furosemide 40 mg daily as needed as directed by Cardiologist Reports last checked home blood pressure on 7/12: 101/69, HR 86 Denies symptoms of hypotension Note patient weighs himself daily and follows directions from Cardiologist for use of PRN furosemide (Lasix).  Have counseled patient on importance of contacting Cardiologist for BP and HR if needed for readings outside of established parameters or symptoms    Medication/Device Adherence Note patient uses weekly pillbox to organize medications   Patient Goals/Self-Care Activities Over the next 90 days, patient will:  Self administers medications as prescribed Attends all scheduled provider appointments Calls pharmacy for medication refills Calls provider office for new concerns or questions  Patient to check blood sugar regularly as directed and keep log Patient to keep log when checks blood pressure  Follow Up Plan: Telephone follow up appointment with care management team member scheduled for: 12/12/2021 at 8:30 am       Wallace Cullens, PharmD, Para [place], [place] [phone]

## 2021-11-11 NOTE — Patient Instructions (Signed)
Visit Information  Thank you for taking time to visit with me today. Please don't hesitate to contact me if I can be of assistance to you before our next scheduled telephone appointment.  Following are the goals we discussed today:   Goals Addressed             This Visit's Progress    Pharmacy Goals       Our goal A1c is less than 7%. This corresponds with fasting sugars less than 130 and 2 hour after meal sugars less than 180. Please check your blood sugar and keep log of results  Please continue to monitor your home weight, blood pressure and heart rate  Our goal bad cholesterol, or LDL, is less than 70 . This is why it is important to continue taking your atorvastatin  Feel free to call me with any questions or concerns. I look forward to our next call!  Wallace Cullens, PharmD, [place] [phone]          Our next appointment is by telephone on 12/12/2021 at 8:30 am  Please call the care guide team at [phone] if you need to cancel or reschedule your appointment.    Patient verbalizes understanding of instructions and care plan provided today and agrees to view in [place]. Active MyChart status and patient understanding of how to access instructions and care plan via MyChart confirmed with patient.

## 2021-11-16 ENCOUNTER — Telehealth: Payer: Self-pay | Admitting: Pharmacy Technician

## 2021-11-16 DIAGNOSIS — Z596 Low income: Secondary | ICD-10-CM

## 2021-11-16 NOTE — Progress Notes (Signed)
[facility])                                            [place] Team    11/16/2021  Philip HARIS Sr. 1945-05-09 [id number]                                      Medication Assistance Referral  Referral From: [facility] Embedded RPh Philip [name]   Medication/Company: Eliquis / BMS Patient application portion:  Mailed Provider application portion: Faxed  to Philip [place], Philip [name] Provider address/fax verified via: Office website   Philip [name], [place]  [phone]

## 2021-11-23 ENCOUNTER — Other Ambulatory Visit: Payer: Self-pay | Admitting: Medical

## 2021-11-28 DIAGNOSIS — I129 Hypertensive chronic kidney disease with stage 1 through stage 4 chronic kidney disease, or unspecified chronic kidney disease: Secondary | ICD-10-CM | POA: Diagnosis not present

## 2021-11-28 DIAGNOSIS — E1142 Type 2 diabetes mellitus with diabetic polyneuropathy: Secondary | ICD-10-CM | POA: Diagnosis not present

## 2021-11-28 DIAGNOSIS — J432 Centrilobular emphysema: Secondary | ICD-10-CM | POA: Diagnosis not present

## 2021-11-28 DIAGNOSIS — N182 Chronic kidney disease, stage 2 (mild): Secondary | ICD-10-CM | POA: Diagnosis not present

## 2021-12-12 ENCOUNTER — Ambulatory Visit: Payer: Medicare Other | Admitting: Pharmacist

## 2021-12-12 DIAGNOSIS — E1142 Type 2 diabetes mellitus with diabetic polyneuropathy: Secondary | ICD-10-CM

## 2021-12-12 DIAGNOSIS — I129 Hypertensive chronic kidney disease with stage 1 through stage 4 chronic kidney disease, or unspecified chronic kidney disease: Secondary | ICD-10-CM

## 2021-12-12 DIAGNOSIS — J432 Centrilobular emphysema: Secondary | ICD-10-CM

## 2021-12-12 NOTE — Patient Instructions (Signed)
Visit Information  Thank you for taking time to visit with me today. Please don't hesitate to contact me if I can be of assistance to you before our next scheduled telephone appointment.  Following are the goals we discussed today:   Goals Addressed             This Visit's Progress    Pharmacy Goals       Our goal A1c is less than 7%. This corresponds with fasting sugars less than 130 and 2 hour after meal sugars less than 180. Please check your blood sugar and keep log of results  Please continue to monitor your home weight, blood pressure and heart rate  Our goal bad cholesterol, or LDL, is less than 70 . This is why it is important to continue taking your atorvastatin  Feel free to call me with any questions or concerns. I look forward to our next call!   Wallace Cullens, PharmD, [place] [phone]          Our next appointment is by telephone on 03/01/2022 at 9:15 AM  Please call the care guide team at [phone] if you need to cancel or reschedule your appointment.    Patient verbalizes understanding of instructions and care plan provided today and agrees to view in [place]. Active MyChart status and patient understanding of how to access instructions and care plan via MyChart confirmed with patient.

## 2021-12-12 NOTE — Chronic Care Management (AMB) (Signed)
Chronic Care Management CCM Pharmacy Note  12/12/2021 Name:  Philip DERNER Sr. MRN:  [id number] DOB:  February 09, 1946   Subjective: Philip Potters Sr. is an 76 y.o. year old adult who is a primary patient of [name], DO.  The CCM team was consulted for assistance with disease management and care coordination needs.    Engaged with patient by telephone for follow up visit for pharmacy case management and/or care coordination services.   Objective:  Medications Reviewed Today     Reviewed by [name], RPH-CPP (Pharmacist) on 12/12/21 at 0859  Med List Status: <None>   Medication Order Taking? Sig Documenting Provider Last Dose Status Informant  acetaminophen (TYLENOL) 500 MG tablet [id number]  Take 1,000 mg by mouth every 8 (eight) hours as needed for moderate pain or mild pain. [provider]  Active Self  Albuterol Sulfate 108 (90 Base) MCG/ACT AEPB [id number]  Inhale 2 puffs into the lungs every 6 (six) hours as needed (shortness of breath).  [provider]  Active Self  apixaban (ELIQUIS) 5 MG TABS tablet [id number]  Take 1 tablet by mouth twice daily Loel Dubonnet, NP  Active   ARTIFICIAL TEAR SOLUTION OP [id number]  Place 1 drop into both eyes daily. [provider]  Active Self  atorvastatin (LIPITOR) 40 MG tablet [id number]  Take 1 tablet by mouth once daily [name], MD  Active   Blood Glucose Monitoring Suppl (ONE TOUCH ULTRA 2) w/Device KIT [id number]  Use to check blood sugar as advised up to twice a day [name], DO  Active Self  calcium carbonate (TUMS - DOSED IN MG ELEMENTAL CALCIUM) 500 MG chewable tablet [id number]  Chew 1,000 mg by mouth daily as needed for indigestion or heartburn. [provider]  Active Self  cetirizine (ZYRTEC) 10 MG tablet [id number]  Take 10 mg by mouth 2 (two) times daily. [provider]  Active Self  EUTHYROX 88 MCG tablet [id number]  Take 1 tablet (88 mcg  total) by mouth daily before breakfast. [name], DO  Active Self  FARXIGA 10 MG TABS tablet [id number] Yes Take 1 tablet (10 mg total) by mouth daily before breakfast. [name], DO Taking Active Self  fluticasone (FLONASE) 50 MCG/ACT nasal spray [id number]  Place 2 sprays into both nostrils daily. [name], [name], DO  Active   furosemide (LASIX) 40 MG tablet [id number]  Take 0.5-1 tablets (20-40 mg total) by mouth daily as needed for edema. Only as needed now. [name], DO  Active Self  gabapentin (NEURONTIN) 100 MG capsule [id number]  Take 1 capsule (100 mg total) by mouth 3 (three) times daily. [name], [name], DO  Active Self  ipratropium-albuterol (DUONEB) 0.5-2.5 (3) MG/3ML SOLN [id number]  Take 3 mLs by nebulization every 6 (six) hours as needed (shortness of breath). [provider]  Active Self           Med Note Jilda Roche A   Fri Dec 24, 2020  4:04 PM)    Lanell Matar THIN MISC [id number]  1 Device by Does not apply route 4 (four) times daily -  before meals and at bedtime. [name], MD  Active Self  losartan (COZAAR) 25 MG tablet [id number]  Take 1 tablet (25 mg total) by mouth every morning. [name], MD  Active   metFORMIN (GLUCOPHAGE) 500 MG tablet [id number] Yes Take 2 tablets (1,000 mg total) by mouth 2 (two)  times daily with a meal. [name], [name], DO Taking Active Self  metoprolol tartrate (LOPRESSOR) 50 MG tablet [id number]  TAKE 1 & 1/2 (ONE & ONE-HALF) TABLETS BY MOUTH TWICE DAILY Fletcher Anon, Muhammad A, MD  Active   montelukast (SINGULAIR) 10 MG tablet [id number]  Take 10 mg by mouth at bedtime. [provider]  Active Self  Donald Siva test strip [id number]  USE TO CHECK BLOOD SUGAR TWICE DAILY [name], DO  Active   spironolactone (ALDACTONE) 25 MG tablet [id number]  Take 1/2 (one-half) tablet by mouth once daily [name], MD  Active   SYMBICORT  160-4.5 MCG/ACT inhaler [id number]  Inhale 2 puffs 2 (two) times daily into the lungs.  [provider]  Active Self            Pertinent Labs:  Lab Results  Component Value Date   HGBA1C 7.1 (H) 07/27/2021   Lab Results  Component Value Date   CHOL 117 07/27/2021   HDL 50 07/27/2021   LDLCALC 49 07/27/2021   TRIG 95 07/27/2021   CHOLHDL 2.3 07/27/2021   Lab Results  Component Value Date   CREATININE 1.13 07/27/2021   BUN 20 07/27/2021   NA 141 07/27/2021   K 4.3 07/27/2021   CL 106 07/27/2021   CO2 26 07/27/2021    SDOH:  (Social Determinants of Health) assessments and interventions performed:    [place]  Review of patient past medical history, allergies, medications, health status, including review of consultants reports, laboratory and other test data, was performed as part of comprehensive evaluation and provision of chronic care management services.   Care Plan : PharmD - Medication Management/Medication Assistance  Updates made by [name], RPH-CPP since 12/12/2021 12:00 AM     Problem: Disease Progression      Long-Range Goal: Disease Progression Prevented or Minimized   Start Date: 06/16/2020  Expected End Date: 09/14/2020  Recent Progress: On track  Priority: High  Note:   Current Barriers:  Knowledge deficits related to dietary choices to improve blood sugar control Financial - difficulty with affording [name] through Medicare Part D health plan Patient APPROVED for patient assistance for Farxiga from [employer] through 04/30/2022 Patient was previously APPROVED for patient assistance for Eliquis from [place] from 01/06/21-04/30/21  Pharmacist Clinical Goal(s):  Over the next 90 days, patient will achieve control of T2DM as evidenced by A1C through collaboration with PharmD and provider.   Interventions: 1:1 collaboration with [name], DO regarding development and update of comprehensive plan of care as evidenced by  provider attestation and co-signature Inter-disciplinary care team collaboration (see longitudinal plan of care)  Medication Assistance: Collaborating with [facility] CPhT Sharee P[place] Simcox for aid to patient with applying for Eliquis patient assistance from [employer] for this calendar year Patient confirms received application from [place].  Today reports he reviewed his and his wife's out of pocket expense statements and found they have not yet met the requirement for Eliquis patient assistance program for the current calendar year Patient states that he will send out of pocket expense statements and complete application to mail back to [place] when this requirement is met.  COPD/seasonal allergies: Patient follows with Dr. Raul Del at [facility] Pulmonology Current treatment: Symbicort 160/4.5 two puffs bid Montelukast 10 mg QHS Albuterol/ipratropium nebulizer solution/ albuterol inhaler as needed Cetirizine 10 mg daily Flonase nasal spray as needed Using saline Navage pods Reports using maintenance (Symbicort) inhaler twice daily consistently and rinsing mouth  out after each use and using rescue inhaler (albuterol) as needed Have discussed rational for using CPAP nightly and encouraged patient to restart as recommended by Pulmonologist Provide patient with basic verbal COPD education on self care/management/and exacerbation prevention.  Review importance of calling provider for [place] symtpoms and for urgent medical care for any red zone symptoms  Type 2 Diabetes Controlled; current treatment: metformin 500 mg - 2 tablets (1,000 mg) twice daily as directed Farxiga 10 mg - 1 tablet QAM   Reports most recent blood sugar results:  Fasting Blood Glucose After Supper/Bedtime    8 - August 122 180   9 - August 119 244* "Ate something that I shouldn't have"  10 - August 128 178   11 - August 137 192   12 - August 123 120   13 - August 141 151   14 - August 140    Have counseled on importance  of continuing to control carbohydrate portion sizes and using blood sugar checks to reinforce dietary choices  CHF/HTN Current treatment: Losartan 25 mg - 1 tablet daily Metoprolol 75 mg twice daily Spironolactone 25 mg - 1/2 tablet (12.5 mg) daily  Farxiga 10 mg - 1 tablets daily Furosemide 40 mg daily as needed as directed by Cardiologist Reports recent home blood pressure readings ranging 117-132/80s Denies symptoms of hypotension Note patient weighs himself daily and follows directions from Cardiologist for use of PRN furosemide (Lasix).  Have counseled patient on importance of contacting Cardiologist for BP and HR if needed for readings outside of established parameters or symptoms    Medication/Device Adherence Note patient uses weekly pillbox to organize medications   Patient Goals/Self-Care Activities Over the next 90 days, patient will:  Self administers medications as prescribed Attends all scheduled provider appointments Calls pharmacy for medication refills Calls provider office for new concerns or questions  Patient to check blood sugar regularly as directed and keep log Patient to keep log when checks blood pressure  Follow Up Plan: Telephone follow up appointment with care management team member scheduled for: 03/01/2022 at [place], PharmD, Para March, [place] [phone]

## 2021-12-16 ENCOUNTER — Other Ambulatory Visit: Payer: Self-pay | Admitting: Family Medicine

## 2021-12-16 DIAGNOSIS — E1142 Type 2 diabetes mellitus with diabetic polyneuropathy: Secondary | ICD-10-CM

## 2021-12-16 NOTE — Telephone Encounter (Signed)
Requested Prescriptions  Pending Prescriptions Disp Refills  . ONETOUCH ULTRA test strip [employer] Med Name: OneTouch Ultra Blue In Vitro Strip] 200 each 6    Sig: USE TO [place]     Endocrinology: Diabetes - Testing Supplies Passed - 12/16/2021  5:45 PM      Passed - Valid encounter within last 12 months    Recent Outpatient Visits          4 months ago Annual physical exam   [place], DO   4 months ago No-show for appointment   [facility] [name], DO   10 months ago Type 2 diabetes mellitus with peripheral neuropathy [facility])   [facility] [name], DO   1 year ago Type 2 diabetes mellitus with hyperlipidemia [facility])   [place], DO   1 year ago Acute idiopathic gout of right ankle   [place], DO      Future Appointments            In 2 months Fletcher Anon, Mertie Clause, MD [facility] [place]

## 2021-12-29 ENCOUNTER — Ambulatory Visit: Payer: Self-pay | Admitting: Licensed Clinical Social Worker

## 2021-12-29 NOTE — Chronic Care Management (AMB) (Signed)
    Clinical Social Work  Care Management   Phone Outreach    12/29/2021 Name: Philip KNOOP Sr. MRN: [id number] DOB: [birth year]  Philip Potters Sr. is a 76 y.o. year old adult who is a primary care patient of [name], DO .   Patient was not contacted during this encounter. Per chart review, pt is not currently receiving CCM Social Work services; therefore, associated care plan goals were completed and closed.   Review of patient status, including review of consultants reports, relevant laboratory and other test results, and collaboration with appropriate care team members and the patient's provider was performed as part of comprehensive patient evaluation and provision of care management services.    Christa See, MSW, [place] [facility] Care Management [email or web address] Phone [phone] 4:02 PM

## 2022-02-02 ENCOUNTER — Other Ambulatory Visit: Payer: Self-pay | Admitting: Family Medicine

## 2022-02-02 ENCOUNTER — Other Ambulatory Visit: Payer: Self-pay | Admitting: Cardiovascular Disease

## 2022-02-02 DIAGNOSIS — E1142 Type 2 diabetes mellitus with diabetic polyneuropathy: Secondary | ICD-10-CM

## 2022-02-02 NOTE — Telephone Encounter (Signed)
Requested Prescriptions  Pending Prescriptions Disp Refills  . metFORMIN (GLUCOPHAGE) 500 MG tablet [Pharmacy Med Name: metFORMIN HCl 500 MG Oral Tablet] 360 tablet 1    Sig: TAKE 2 TABLETS BY MOUTH TWICE DAILY WITH A MEAL     Endocrinology:  Diabetes - Biguanides Failed - 02/02/2022  1:48 PM      Failed - HBA1C is between 0 and 7.9 and within 180 days    Hemoglobin A1C  Date Value Ref Range Status  06/01/2020 8.7  Final    Comment:    [employer] Nurse   Hgb A1c MFr Bld  Date Value Ref Range Status  07/27/2021 7.1 (H) <5.7 % of total Hgb Final    Comment:    For someone without known diabetes, a hemoglobin A1c value of 6.5% or greater indicates that they may have  diabetes and this should be confirmed with a follow-up  test. . For someone with known diabetes, a value <7% indicates  that their diabetes is well controlled and a value  greater than or equal to 7% indicates suboptimal  control. A1c targets should be individualized based on  duration of diabetes, age, comorbid conditions, and  other considerations. . Currently, no consensus exists regarding use of hemoglobin A1c for diagnosis of diabetes for children. .          Passed - Cr in normal range and within 360 days    Creat  Date Value Ref Range Status  07/27/2021 1.13 0.70 - 1.28 mg/dL Final         Passed - eGFR in normal range and within 360 days    GFR, Est African American  Date Value Ref Range Status  06/24/2018 85 > OR = 60 mL/min/1.56m Final   GFR calc Af Amer  Date Value Ref Range Status  05/31/2020 67 >59 mL/min/1.73 Final    Comment:    **In accordance with recommendations from the NKF-ASN Task force,**   [facility] is in the process of updating its eGFR calculation to the   2021 CKD-EPI creatinine equation that estimates kidney function   without a race variable.    GFR, Est Non African American  Date Value Ref Range Status  06/24/2018 74 > OR = 60 mL/min/1.713mFinal   GFR, Estimated  Date Value  Ref Range Status  03/02/2021 >60 >60 mL/min Final    Comment:    (NOTE) Calculated using the CKD-EPI Creatinine Equation (2021)    eGFR  Date Value Ref Range Status  07/27/2021 68 > OR = 60 mL/min/1.7316minal    Comment:    The eGFR is based on the CKD-EPI 2021 equation. To calculate  the new eGFR from a previous Creatinine or Cystatin C result, go to https://www.kidney.org/professionals/ kdoqi/gfr%5Fcalculator          Passed - B12 Level in normal range and within 720 days    Vitamin B-12  Date Value Ref Range Status  07/27/2021 253 200 - 1,100 pg/mL Final    Comment:    . Please Note: Although the reference range for vitamin B12 is [phone] pg/mL, it has been reported that between 5 and 10% of patients with values between 200 and 400 pg/mL may experience neuropsychiatric and hematologic abnormalities due to occult B12 deficiency; less than 1% of patients with values above 400 pg/mL will have symptoms. .  Renella CunasValid encounter within last 6 months    Recent Outpatient Visits  6 months ago Annual physical exam   [facility] [name], DO   6 months ago No-show for appointment   [facility] [place], [name], DO   1 year ago Type 2 diabetes mellitus with peripheral neuropathy [facility])   [facility] [name], DO   1 year ago Type 2 diabetes mellitus with hyperlipidemia [facility])   [employer], DO   1 year ago Acute idiopathic gout of right ankle   [place], DO      Future Appointments            In 2 weeks [name], MD [place]           Passed - CBC within normal limits and completed in the last 12 months    WBC  Date Value Ref Range Status  07/27/2021 6.2 3.8 - 10.8 Thousand/uL Final   RBC  Date Value Ref  Range Status  07/27/2021 4.65 4.20 - 5.80 Million/uL Final   Hemoglobin  Date Value Ref Range Status  07/27/2021 13.5 13.2 - 17.1 g/dL Final  05/31/2020 15.2 13.0 - 17.7 g/dL Final   HCT  Date Value Ref Range Status  07/27/2021 41.5 38.5 - 50.0 % Final   Hematocrit  Date Value Ref Range Status  05/31/2020 45.2 37.5 - 51.0 % Final   MCHC  Date Value Ref Range Status  07/27/2021 32.5 32.0 - 36.0 g/dL Final   [facility]  Date Value Ref Range Status  07/27/2021 29.0 27.0 - 33.0 pg Final   MCV  Date Value Ref Range Status  07/27/2021 89.2 80.0 - 100.0 fL Final  05/31/2020 87 79 - 97 fL Final   No results found for: "PLTCOUNTKUC", "LABPLAT", "POCPLA" RDW  Date Value Ref Range Status  07/27/2021 13.6 11.0 - 15.0 % Final  05/31/2020 12.9 11.6 - 15.4 % Final

## 2022-02-15 DIAGNOSIS — J449 Chronic obstructive pulmonary disease, unspecified: Secondary | ICD-10-CM | POA: Diagnosis not present

## 2022-02-15 DIAGNOSIS — R058 Other specified cough: Secondary | ICD-10-CM | POA: Diagnosis not present

## 2022-02-15 DIAGNOSIS — J301 Allergic rhinitis due to pollen: Secondary | ICD-10-CM | POA: Diagnosis not present

## 2022-02-15 DIAGNOSIS — G4733 Obstructive sleep apnea (adult) (pediatric): Secondary | ICD-10-CM | POA: Diagnosis not present

## 2022-02-15 DIAGNOSIS — R059 Cough, unspecified: Secondary | ICD-10-CM | POA: Diagnosis not present

## 2022-02-15 NOTE — Progress Notes (Unsigned)
Cardiology Office Note   Date:  02/16/2022   ID:  Philip Potters Sr., DOB 11-15-1945, MRN [id number]  PCP:  [name], DO  Cardiologist:   [name], MD   Chief Complaint  Patient presents with   Other    3 month f/u no complaints today. Meds reviewed verbally with pt.      History of Present Illness: Philip Leon. is a 76 y.o. adult who presents for a follow-up visit regarding coronary artery disease, chronic atrial fibrillation and atrial tachycardia.  He is status post CABG in January of 2014.  He had postoperative atrial fibrillation which was treated with amiodarone.    He was hospitalized in February 2021 with sepsis secondary to pneumonia and MSSA bacteremia complicated by atrial flutter with RVR.  Echo showed an EF of 40 to 45% with global hypokinesis, mildly reduced RV function, moderate pulmonary hypertension mild to moderate mitral regurgitation.  TEE showed an EF of 30 to 35% with no evidence of vegetations.  He had a Lexiscan Myoview in March of 2021which showed no evidence of ischemia .  Most recent echocardiogram in October 2022 showed an EF of 40 to 45% with normal LV function.  The patient had back surgery and right knee replacement in 2022.   He has been doing reasonably well overall with no chest pain.  He reports stable exertional dyspnea which has been chronic for many years.  No palpitations or dizziness.    Past Medical History:  Diagnosis Date   (HFpEF) heart failure with preserved ejection fraction ([place])    a. 06/2015 Echo: EF 50-55%, Gr1 DD. Mild MR. Mildly dil LA. Nl RV fxn. Nl PASP.   Arthritis    a. right knee   Asthma    Atrial tachycardia    Chronic anticoagulation    Apixaban   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 etiology.   COPD (chronic obstructive pulmonary disease) (HCC)    Coronary artery disease    a. 05/2012 Cath: severe 3VD-->CABG x 3 by Dr. Servando Snare in 01/07/2014[place], SVG->LCX & SVG->RPDA; b.  06/2014 MV: no ischemia/infarct.   DDD (degenerative disc disease), lumbar    Dyspnea    Edema    LEGS/ FEET   Essential hypertension    History of Helicobacter pylori infection    HNP (herniated nucleus pulposus), lumbar    HOH (hard of hearing)    Hypercholesteremia    Hypothyroidism    Low back pain radiating to lower extremity    Lumbar radiculitis    Lumbar stenosis with neurogenic claudication    Neuropathy    PAF (paroxysmal atrial fibrillation) (HCC)    a. CHA2DS2VASc = 5 (age, CHF, HTN, vascular disase, T2DM) --> Eliquis.   Sepsis ([place]) 2021   Shingles    Sleep apnea    NO CPAP   Type II diabetes mellitus [facility])     Past Surgical History:  Procedure Laterality Date   ANTERIOR CERVICAL DECOMP/DISCECTOMY FUSION  ~ 2009   CARDIAC CATHETERIZATION  05/06/2012   Significant three-vessel coronary artery disease with normal ejection fraction   CATARACT EXTRACTION W/PHACO Right 08/15/2017   Procedure: CATARACT EXTRACTION PHACO AND INTRAOCULAR LENS PLACEMENT ([place]);  Surgeon: Birder Robson, MD;  Location: [facility] ORS;  Service: Ophthalmology;  Laterality: Right;  [place] 00:23 AP% 13.3 CDE 3.11 Fluid pack lot # [id number]   CATARACT EXTRACTION W/PHACO Left 10/03/2017   Procedure: CATARACT EXTRACTION PHACO AND INTRAOCULAR LENS  PLACEMENT (IOC);  Surgeon: Birder Robson, MD;  Location: [facility] OR[facility];  Service: Ophthalmology;  Laterality: Left;  Lot #[id number] H [place]: 00:21.2 AP%:13.9 CDE: 2.96   CORONARY ARTERY BYPASS GRAFT  05/07/2012   Procedure: CORONARY ARTERY BYPASS GRAFTING (CABG);  Surgeon: [name], MD;  Location: [place];  Service: Open Heart Surgery;  Laterality: N/A;  times three   INTRAOPERATIVE TRANSESOPHAGEAL ECHOCARDIOGRAM  05/07/2012   Procedure: INTRAOPERATIVE TRANSESOPHAGEAL ECHOCARDIOGRAM;  Surgeon: [name], MD;  Location: [place];  Service: Open Heart Surgery;  Laterality: N/A;   KNEE ARTHROPLASTY Right 03/07/2021   Procedure: COMPUTER ASSISTED TOTAL RIGHT  KNEE ARTHROPLASTY -;  Surgeon: Dereck Leep, MD;  Location: [facility];  Service: Orthopedics;  Laterality: Right;   KNEE ARTHROSCOPY Right ~ 2000   KNEE SURGERY Right 2003   LUMBAR LAMINECTOMY/DECOMPRESSION MICRODISCECTOMY Right 01/07/2021   Procedure: RIGHT L3-4 MICRODISCECTOMY 1 LEVEL;  Surgeon: Meade Maw, MD;  Location: [facility];  Service: Neurosurgery;  Laterality: Right;   TEE WITHOUT CARDIOVERSION N/A 07/04/2019   Procedure: TRANSESOPHAGEAL ECHOCARDIOGRAM (TEE);  Surgeon: Nelva Bush, MD;  Location: [facility];  Service: Cardiovascular;  Laterality: N/A;     Current Outpatient Medications  Medication Sig Dispense Refill   acetaminophen (TYLENOL) 500 MG tablet Take 1,000 mg by mouth every 8 (eight) hours as needed for moderate pain or mild pain.     Albuterol Sulfate 108 (90 Base) MCG/ACT AEPB Inhale 2 puffs into the lungs every 6 (six) hours as needed (shortness of breath).      apixaban (ELIQUIS) 5 MG TABS tablet Take 1 tablet by mouth twice daily 60 tablet 5   ARTIFICIAL TEAR SOLUTION OP Place 1 drop into both eyes daily.     atorvastatin (LIPITOR) 40 MG tablet Take 1 tablet by mouth once daily 90 tablet 3   Blood Glucose Monitoring Suppl (ONE TOUCH ULTRA 2) w/Device KIT Use to check blood sugar as advised up to twice a day 1 kit 0   calcium carbonate (TUMS - DOSED IN MG ELEMENTAL CALCIUM) 500 MG chewable tablet Chew 1,000 mg by mouth daily as needed for indigestion or heartburn.     cetirizine (ZYRTEC) 10 MG tablet Take 10 mg by mouth 2 (two) times daily.     EUTHYROX 88 MCG tablet Take 1 tablet (88 mcg total) by mouth daily before breakfast. 90 tablet 3   FARXIGA 10 MG TABS tablet Take 1 tablet (10 mg total) by mouth daily before breakfast. 90 tablet 3   fluticasone (FLONASE) 50 MCG/ACT nasal spray Place 2 sprays into both nostrils daily. 16 g 3   furosemide (LASIX) 40 MG tablet Take 0.5-1 tablets (20-40 mg total) by mouth daily as needed for edema. Only as needed now. 45  tablet 3   gabapentin (NEURONTIN) 100 MG capsule Take 1 capsule (100 mg total) by mouth 3 (three) times daily. 270 capsule 3   ipratropium-albuterol (DUONEB) 0.5-2.5 (3) MG/3ML SOLN Take 3 mLs by nebulization every 6 (six) hours as needed (shortness of breath).     LANCETS ULTRA THIN MISC 1 Device by Does not apply route 4 (four) times daily -  before meals and at bedtime. 200 each 5   losartan (COZAAR) 25 MG tablet Take 1 tablet (25 mg total) by mouth every morning. 90 tablet 3   metFORMIN (GLUCOPHAGE) 500 MG tablet TAKE 2 TABLETS BY MOUTH TWICE DAILY WITH A MEAL 360 tablet 1   metoprolol tartrate (LOPRESSOR) 50 MG tablet TAKE 1 & 1/2 (ONE &  ONE-HALF) TABLETS BY MOUTH TWICE DAILY 270 tablet 1   montelukast (SINGULAIR) 10 MG tablet Take 10 mg by mouth at bedtime.     ONETOUCH ULTRA test strip USE TO CHECK BLOOD SUGAR TWICE DAILY 200 each 6   spironolactone (ALDACTONE) 25 MG tablet Take 1/2 (one-half) tablet by mouth once daily 45 tablet 0   SYMBICORT 160-4.5 MCG/ACT inhaler Inhale 2 puffs 2 (two) times daily into the lungs.      azithromycin (ZITHROMAX) 250 MG tablet Take 250 mg by mouth as directed. (Patient not taking: Reported on 02/16/2022)     No current facility-administered medications for this visit.    Allergies:   Bydureon [exenatide], Clindamycin/lincomycin, Penicillins, Sulfa antibiotics, Tiotropium, Penicillin g, Spiriferis, Spiriva handihaler [tiotropium bromide monohydrate], Iodinated contrast media, and Other    Social History:  The patient  reports that he quit smoking about 54 years ago. His smoking use included cigarettes. He has a 8.00 pack-year smoking history. He has quit using smokeless tobacco. He reports that he does not drink alcohol and does not use drugs.   Family History:  The patient's family history includes Clotting disorder in his mother; Heart disease in his mother; Hypertension in his sister.    ROS:  Please see the history of present illness.   Otherwise,  review of systems are positive for none.   All other systems are reviewed and negative.    PHYSICAL EXAM: VS:  BP 120/78 (BP Location: Left Arm, Patient Position: Sitting, Cuff Size: Normal)   Pulse 70   Ht 5' 10" (1.778 m)   Wt 189 lb 6 oz (85.9 kg)   SpO2 96%   BMI 27.17 kg/m  , BMI Body mass index is 27.17 kg/m. GEN: Well nourished, well developed, in no acute distress  HEENT: normal  Neck: no JVD, carotid bruits, or masses Cardiac: Irregularly irregular; no murmurs, rubs, or gallops, trace edema Respiratory: Lungs are clear, normal work of breathing GI: soft, nontender, nondistended, + BS MS: no deformity or atrophy  Skin: warm and dry, no rash Neuro:  Strength and sensation are intact Psych: euthymic mood, full affect   EKG:  EKG is ordered today. The ekg ordered today demonstrates atrial fibrillation with a ventricular rate of 72 bpm.  Low voltage.  No significant ST or T wave changes.   Recent Labs: 07/27/2021: ALT 19; BUN 20; Creat 1.13; Hemoglobin 13.5; Platelets 222; Potassium 4.3; Sodium 141; TSH 5.82    Lipid Panel    Component Value Date/Time   CHOL 117 07/27/2021 0756   CHOL 139 12/14/2014 0834   TRIG 95 07/27/2021 0756   HDL 50 07/27/2021 0756   HDL 43 12/14/2014 0834   CHOLHDL 2.3 07/27/2021 0756   VLDL 20 11/21/2019 0915   LDLCALC 49 07/27/2021 0756      Wt Readings from Last 3 Encounters:  02/16/22 189 lb 6 oz (85.9 kg)  11/09/21 186 lb 3.2 oz (84.5 kg)  08/11/21 192 lb (87.1 kg)        ASSESSMENT AND PLAN:   1.  Chronic atrial fibrillation: Ventricular rate is well controlled and he is tolerating anticoagulation with Eliquis with no side effects.  I reviewed most recent labs done in March which were unremarkable.  2. Coronary artery disease involving native coronary arteries without angina: He has no anginal symptoms.  He has stable exertional dyspnea which has not changed over the last few years.  3.  Chronic systolic heart failure:  Most recent EF was 40  to 45%.  Continue treatment with metoprolol, losartan, spironolactone and Farxiga.  Blood pressure has been on the low side and thus we elected not to switch to [facility].  Cost is also an issue.  He uses furosemide as needed and has not required this in the last few months.  4. Hyperlipidemia: Continue treatment with atorvastatin.  Most recent lipid profile in March showed an LDL of 49.    Disposition:   FU with me in 6 months  Signed,  [name], MD  02/16/2022 10:26 AM    [place]

## 2022-02-16 ENCOUNTER — Encounter: Payer: Self-pay | Admitting: Cardiovascular Disease

## 2022-02-16 ENCOUNTER — Other Ambulatory Visit: Payer: Self-pay | Admitting: Family Medicine

## 2022-02-16 ENCOUNTER — Other Ambulatory Visit: Payer: Self-pay | Admitting: Cardiovascular Disease

## 2022-02-16 ENCOUNTER — Ambulatory Visit: Payer: Medicare Other | Attending: Cardiovascular Disease | Admitting: Cardiovascular Disease

## 2022-02-16 VITALS — BP 120/78 | HR 70 | Ht 70.0 in | Wt 189.4 lb

## 2022-02-16 DIAGNOSIS — I5022 Chronic systolic (congestive) heart failure: Secondary | ICD-10-CM

## 2022-02-16 DIAGNOSIS — E785 Hyperlipidemia, unspecified: Secondary | ICD-10-CM | POA: Diagnosis not present

## 2022-02-16 DIAGNOSIS — I2510 Atherosclerotic heart disease of native coronary artery without angina pectoris: Secondary | ICD-10-CM

## 2022-02-16 DIAGNOSIS — E034 Atrophy of thyroid (acquired): Secondary | ICD-10-CM

## 2022-02-16 DIAGNOSIS — E1142 Type 2 diabetes mellitus with diabetic polyneuropathy: Secondary | ICD-10-CM

## 2022-02-16 DIAGNOSIS — I4821 Permanent atrial fibrillation: Secondary | ICD-10-CM | POA: Diagnosis not present

## 2022-02-16 NOTE — Telephone Encounter (Signed)
Requested Prescriptions  Pending Prescriptions Disp Refills  . levothyroxine (SYNTHROID) 88 MCG tablet [Pharmacy Med Name: Levothyroxine Sodium 88 MCG Oral Tablet] 90 tablet 0    Sig: TAKE 1 TABLET BY MOUTH ONCE DAILY BEFORE BREAKFAST     Endocrinology:  Hypothyroid Agents Failed - 02/16/2022 10:49 AM      Failed - TSH in normal range and within 360 days    TSH  Date Value Ref Range Status  07/27/2021 5.82 (H) 0.40 - 4.50 mIU/L Final         Passed - Valid encounter within last 12 months    Recent Outpatient Visits          6 months ago Annual physical exam   [place], DO   6 months ago No-show for appointment   [facility] [name], DO   1 year ago Type 2 diabetes mellitus with peripheral neuropathy [facility])   [facility] [name], DO   1 year ago Type 2 diabetes mellitus with hyperlipidemia [facility])   [place], DO   1 year ago Acute idiopathic gout of right ankle   [place], DO      Future Appointments            In 6 months Fletcher Anon, Mertie Clause, MD [place]. [facility]           . gabapentin (NEURONTIN) 100 MG capsule [Pharmacy Med Name: Gabapentin 100 MG Oral Capsule] 270 capsule 0    Sig: TAKE 1 CAPSULE BY MOUTH THREE TIMES DAILY     Neurology: Anticonvulsants - gabapentin Passed - 02/16/2022 10:49 AM      Passed - Cr in normal range and within 360 days    Creat  Date Value Ref Range Status  07/27/2021 1.13 0.70 - 1.28 mg/dL Final         Passed - Completed PHQ-2 or PHQ-9 in the last 360 days      Passed - Valid encounter within last 12 months    Recent Outpatient Visits          6 months ago Annual physical exam   [place], DO   6 months ago No-show for appointment   [facility] [name], DO   1 year ago Type 2 diabetes mellitus with peripheral neuropathy [facility])   [facility] [name], DO   1 year ago Type 2 diabetes mellitus with hyperlipidemia [facility])   [place], DO   1 year ago Acute idiopathic gout of right ankle   [facility] [name], DO      Future Appointments            In 6 months Fletcher Anon, Mertie Clause, MD [place]. [place]

## 2022-02-16 NOTE — Patient Instructions (Signed)
Medication Instructions:  No changes at this time.   *If you need a refill on your cardiac medications before your next appointment, please call your pharmacy*   Lab Work: None  If you have labs (blood work) drawn today and your tests are completely normal, you will receive your results only by: [place] (if you have MyChart) OR A paper copy in the mail If you have any lab test that is abnormal or we need to change your treatment, we will call you to review the results.   Testing/Procedures: None   Follow-Up: At [facility], you and your health needs are our priority.  As part of our continuing mission to provide you with exceptional heart care, we have created designated Provider Care Teams.  These Care Teams include your primary Cardiologist (physician) and Advanced Practice Providers (APPs -  Physician Assistants and Nurse Practitioners) who all work together to provide you with the care you need, when you need it.   Your next appointment:   6 month(s)  The format for your next appointment:   In Person  Provider:   You may see [name], MD or one of the following Advanced Practice Providers on your designated Care Team:   Murray Hodgkins, NP Christell Faith, [name] Cadence Kathlen Mody, [name] Gerrie Nordmann, NP    Important Information About Sugar

## 2022-02-22 ENCOUNTER — Telehealth: Payer: Self-pay | Admitting: Pharmacy Technician

## 2022-02-22 ENCOUNTER — Other Ambulatory Visit: Payer: Self-pay | Admitting: Cardiovascular Disease

## 2022-02-22 DIAGNOSIS — Z596 Low income: Secondary | ICD-10-CM

## 2022-02-22 NOTE — Progress Notes (Signed)
[place] [facility])                                            [place] Team    02/22/2022  HOBY KAWAI Sr. Mar 06, 1946 [id number]  Received both patient and provider portion(s) of patient assistance application(s) for Eliquis. Faxed completed application and required documents into BMS.   Bethany Cumming P. Lennart Gladish, [place]  [phone]

## 2022-02-27 ENCOUNTER — Telehealth: Payer: Self-pay | Admitting: Pharmacy Technician

## 2022-02-27 DIAGNOSIS — Z596 Low income: Secondary | ICD-10-CM

## 2022-02-27 NOTE — Progress Notes (Signed)
[place] [facility])                                            [place] Team    02/27/2022  Philip LEVESQUE Sr. August 04, 1945 [id number]  Care coordination call placed to [employer]MS in regard to Eliquis application.  Spoke to [place] who informs patient is APPROVED 02/23/22-04/30/22. Medication will be delivered to patient's home. Patient is aware of his approval.  Almetta Liddicoat P. Kehaulani Fruin, [place]  [phone]

## 2022-03-01 ENCOUNTER — Ambulatory Visit: Payer: Medicare Other | Admitting: Pharmacist

## 2022-03-01 ENCOUNTER — Telehealth: Payer: Self-pay | Admitting: Cardiovascular Disease

## 2022-03-01 DIAGNOSIS — I959 Hypotension, unspecified: Secondary | ICD-10-CM

## 2022-03-01 DIAGNOSIS — E1142 Type 2 diabetes mellitus with diabetic polyneuropathy: Secondary | ICD-10-CM

## 2022-03-01 DIAGNOSIS — I129 Hypertensive chronic kidney disease with stage 1 through stage 4 chronic kidney disease, or unspecified chronic kidney disease: Secondary | ICD-10-CM

## 2022-03-01 DIAGNOSIS — J432 Centrilobular emphysema: Secondary | ICD-10-CM

## 2022-03-01 NOTE — Chronic Care Management (AMB) (Signed)
03/01/2022 Name: [name] MRN: [id number] DOB: 12-24-1945  Chief Complaint  Patient presents with   Medication Management   Medication Assistance    VASILIY MCCARRY Sr. is a 76 y.o. year old adult who presented for a telephone visit.   Patient was referred to the pharmacist by their PCP for assistance in managing diabetes, hypertension, and medication access.    Subjective:  Care Team: Primary Care Provider: [name], DO Cardiologist: [name], MD; Next Scheduled Visit: 08/18/2022 Pulmonologist: Erby Pian, MD; Next Scheduled Visit: 08/17/2022  Medication Access/Adherence  Current Pharmacy:  [facility] [address] ([address]), [place] [address] [place]) [place] [address] Phone: [phone] Fax: [phone]   Patient reports affordability concerns with their medications: No   Patient reports access/transportation concerns to their pharmacy: No  Patient reports adherence concerns with their medications:  No    From review of chart, note [facility] CPhT Philip [name] followed up with [employer] assistance program on 02/27/2022 and was informed patient approved for assistance program 02/23/22-04/30/22 - Today patient reports he received shipment of 90 day supply of the Eliquis Rx from program on 10/30.  COPD/seasonal allergies: Patient follows with Dr. Raul Del at [facility]  Today reports breathing and chest congestion significantly improved with completion of courses of steroid and Zpak from Pulmonologist  Current medications: Symbicort 160/4.5 two puffs bid Montelukast 10 mg QHS Albuterol/ipratropium nebulizer solution/ albuterol inhaler as needed Cetirizine 10 mg daily Flonase nasal spray as needed  Reports using maintenance (Symbicort) inhaler twice daily consistently and rinsing mouth out after each use and using rescue inhaler (albuterol) as needed   Type 2  Diabetes:  Current medications:  metformin 500 mg - 2 tablets (1,000 mg) twice daily as directed Farxiga 10 mg - 1 tablet QAM    Current glucose readings:   Fasting Blood Glucose After Supper/Bedtime    26 - October 121 294* *Attributes elevated readings to impact of recent steroid course from Pulmonology  27 - October 128 244*   28 - October 120 156   29 - October 119 176   30 - October 120 141   31 - October 114 174   1 - November -      Patient denies hypoglycemic s/sx including dizziness, shakiness, sweating.    Current medication access support: enrolled in patient assistance for Farxiga from [employer] through 04/30/2022  CHF/Hypertension:  Current medications:  Losartan 25 mg - 1 tablet daily Metoprolol 75 mg twice daily Spironolactone 25 mg - 1/2 tablet (12.5 mg) daily  Farxiga 10 mg - 1 tablets daily Furosemide 40 mg daily as needed as directed by Cardiologist Denies needing to take furosemide recently based on daily weights or symptoms  Patient has an automated, upper arm home BP cuff Current blood pressure readings readings:   AM BP PM BP  29 - October 102/73, HR 52 *87/62, HR 65  30 - October 111/76, HR 57 *93/68, HR 73  31 - October 110/82, HR 63 *91/65, HR 64  1 - November 114/78, HR 77     *Patient denies hypotensive s/sx including dizziness, lightheadedness with recent readings - reports that he has been feeling good States that he will contact Cardiology office today regarding his recent home blood pressure readings    Objective: Lab Results  Component Value Date   HGBA1C 7.1 (H) 07/27/2021    Lab Results  Component Value Date   CREATININE  1.13 07/27/2021   BUN 20 07/27/2021   NA 141 07/27/2021   K 4.3 07/27/2021   CL 106 07/27/2021   CO2 26 07/27/2021    Lab Results  Component Value Date   CHOL 117 07/27/2021   HDL 50 07/27/2021   LDLCALC 49 07/27/2021   TRIG 95 07/27/2021   CHOLHDL 2.3 07/27/2021    Medications Reviewed Today      Reviewed by [name], RPH-CPP (Pharmacist) on 03/01/22 at 1354  Med List Status: <None>   Medication Order Taking? Sig Documenting Provider Last Dose Status Informant  acetaminophen (TYLENOL) 500 MG tablet [id number]  Take 1,000 mg by mouth every 8 (eight) hours as needed for moderate pain or mild pain. [provider]  Active Self  Albuterol Sulfate 108 (90 Base) MCG/ACT AEPB [id number] Yes Inhale 2 puffs into the lungs every 6 (six) hours as needed (shortness of breath).  [provider] Taking Active Self  apixaban (ELIQUIS) 5 MG TABS tablet [id number] Yes Take 1 tablet by mouth twice daily Loel Dubonnet, NP Taking Active   ARTIFICIAL TEAR SOLUTION OP [id number]  Place 1 drop into both eyes daily. [provider]  Active Self  atorvastatin (LIPITOR) 40 MG tablet [id number] Yes Take 1 tablet by mouth once daily [name], MD Taking Active   Blood Glucose Monitoring Suppl (ONE TOUCH ULTRA 2) w/Device KIT [id number]  Use to check blood sugar as advised up to twice a day [name], DO  Active Self  calcium carbonate (TUMS - DOSED IN MG ELEMENTAL CALCIUM) 500 MG chewable tablet [id number]  Chew 1,000 mg by mouth daily as needed for indigestion or heartburn. [provider]  Active Self  cetirizine (ZYRTEC) 10 MG tablet [id number] Yes Take 10 mg by mouth daily. [provider] Taking Active Self  cyanocobalamin (VITAMIN B12) 1000 MCG tablet [id number] Yes Take 1,000 mcg by mouth daily. [provider] Taking Active   FARXIGA 10 MG TABS tablet [id number] Yes Take 1 tablet (10 mg total) by mouth daily before breakfast. [name], DO Taking Active Self  fluticasone (FLONASE) 50 MCG/ACT nasal spray [id number] Yes Place 2 sprays into both nostrils daily. [name], DO Taking Active   furosemide (LASIX) 40 MG tablet [id number]  Take 0.5-1 tablets (20-40 mg total) by mouth daily as needed for edema. Only  as needed now. [name], DO  Active Self  gabapentin (NEURONTIN) 100 MG capsule [id number] Yes TAKE 1 CAPSULE BY MOUTH THREE TIMES DAILY [name], [name], DO Taking Active   ipratropium-albuterol (DUONEB) 0.5-2.5 (3) MG/3ML SOLN [id number]  Take 3 mLs by nebulization every 6 (six) hours as needed (shortness of breath). [provider]  Active Self           Med Note Jilda Roche A   Fri Dec 24, 2020  4:04 PM)    Lanell Matar THIN MISC [id number]  1 Device by Does not apply route 4 (four) times daily -  before meals and at bedtime. [name], MD  Active Self  levothyroxine (SYNTHROID) 88 MCG tablet [id number] Yes TAKE 1 TABLET BY MOUTH ONCE DAILY BEFORE BREAKFAST [name], [name], DO Taking Active   losartan (COZAAR) 25 MG tablet [id number] Yes Take 1 tablet (25 mg total) by mouth every morning. [name], MD Taking Active   metFORMIN (GLUCOPHAGE) 500 MG tablet [id number] Yes TAKE 2 TABLETS BY MOUTH TWICE DAILY WITH A MEAL [name], [name], DO Taking Active  metoprolol tartrate (LOPRESSOR) 50 MG tablet [id number] Yes TAKE 1 & 1/2 (ONE & ONE-HALF) TABLETS BY MOUTH TWICE DAILY [name], MD Taking Active   montelukast (SINGULAIR) 10 MG tablet [id number] Yes Take 10 mg by mouth at bedtime. [provider] Taking Active Self  Donald Siva test strip [id number]  USE TO CHECK BLOOD SUGAR TWICE DAILY [name], DO  Active   spironolactone (ALDACTONE) 25 MG tablet [id number] Yes Take 1/2 (one-half) tablet by mouth once daily [name], MD Taking Active   SYMBICORT 160-4.5 MCG/ACT inhaler [id number] Yes Inhale 2 puffs 2 (two) times daily into the lungs.  [provider] Taking Active Self              Assessment/Plan:   Remind patient to contact office to schedule follow up appointment with PCP  Diabetes: - Have counseled on importance of continuing to control carbohydrate portion sizes and  using blood sugar checks to reinforce dietary choices - Recommend to continue to check glucose, keep log of results and to bring this record to medical appointments - Meets financial criteria for re-enrollment in [place] patient assistance program through [employer]. Will collaborate with provider, CPhT, and patient to pursue assistance.    CHF/Hypertension: - Encourage patient to continue to weigh daily and and follow directions from Cardiologist for use of PRN furosemide (Lasix).  - Recommended to continue to check home blood pressure and heart rate, keep log of results and contact Cardiologist for BP and HR if needed for readings outside of established parameters or symptoms Patient states he will contact Cardiology office today regarding his recent home blood pressure readings    COPD/Seasonal Allergies: - Reviewed appropriate inhaler technique. - Have discussed rational for using CPAP nightly and encouraged patient to restart as recommended by Pulmonologist   Follow Up Plan: Clinical Pharmacist will follow up with patient by telephone on 04/03/2022 at 9:15 am  Wallace Cullens, PharmD, Para [place], [facility] [place] [phone]

## 2022-03-01 NOTE — Telephone Encounter (Signed)
Pt c/o BP issue: STAT if pt c/o blurred vision, one-sided weakness or slurred speech  1. What are your last 5 BP readings?  1'14/78 91/65 93/68 ''87/62 97/68 99/61 '$ 106/63  2. Are you having any other symptoms (ex. Dizziness, headache, blurred vision, passed out)? No  3. What is your BP issue? Pt states that he was told at his appt today with a pharmacist that he should call his cardiologist and report his bp readings. He states he has been feeling fine, but wanted to make sure these numbers were okay.

## 2022-03-01 NOTE — Telephone Encounter (Signed)
Attempted to call the patient to follow up on his BP readings and medications.  No answer- no voice mail set up.  Reviewed the patient's chart and a pharmacy note from today with Wallace Cullens, Pharmacist at [facility].    CHF/Hypertension:   Current medications:  Losartan 25 mg - 1 tablet daily Metoprolol 75 mg twice daily Spironolactone 25 mg - 1/2 tablet (12.5 mg) daily  Farxiga 10 mg - 1 tablets daily Furosemide 40 mg daily as needed as directed by Cardiologist Denies needing to take furosemide recently based on daily weights or symptoms   Patient has an automated, upper arm home BP cuff Current blood pressure readings readings:    AM BP PM BP  29 - October 102/73, HR 52 *87/62, HR 65  30 - October 111/76, HR 57 *93/68, HR 73  31 - October 110/82, HR 63 *91/65, HR 64  1 - November 114/78, HR 77      Per pharmacy notes, the patient is not having symptoms with low BP readings.   Will forward to Dr. Fletcher Anon to review and provide further recommendations.

## 2022-03-01 NOTE — Patient Instructions (Signed)
Goals Addressed             This Visit's Progress    Pharmacy Goals       Our goal A1c is less than 7%. This corresponds with fasting sugars less than 130 and 2 hour after meal sugars less than 180. Please check your blood sugar and keep log of results  Please continue to monitor your home weight, blood pressure and heart rate  Our goal bad cholesterol, or LDL, is less than 70 . This is why it is important to continue taking your atorvastatin  Feel free to call me with any questions or concerns. I look forward to our next call!    Erendira Crabtree Lynnix Schoneman, PharmD, BCACP Clinical Pharmacist [facility] [place] [phone]         

## 2022-03-01 NOTE — Telephone Encounter (Signed)
Call received back from the patient.  I have clarified with him that he is taking:  - Farxiga 10 mg once daily in the AM - Metoprolol 75 mg BID - Spironolactone 12.5 mg once daily in the AM - Furosemide 40 mg daily as needed, but he has taken this in the last 2 months.  He is checking his BP twice daily and this has been running ok for him up until the last 1 week. Typical SBP's are 118/126/132. Over the last week, his evening BP's have been low at 87/62, 93/68, 91/65. HR's are running 60-70's.  Per the patient, he is feeling fine and not experiencing dizziness/ lightheadedness at all.  I advised him I am forwarding this message to Dr. Fletcher Anon to review and we will call him back with any further recommendations.  The patient voices understanding and is agreeable.  He was appreciative of the call today.

## 2022-03-02 ENCOUNTER — Telehealth: Payer: Self-pay | Admitting: Pharmacy Technician

## 2022-03-02 DIAGNOSIS — Z96651 Presence of right artificial knee joint: Secondary | ICD-10-CM | POA: Diagnosis not present

## 2022-03-02 DIAGNOSIS — Z596 Low income: Secondary | ICD-10-CM

## 2022-03-02 NOTE — Telephone Encounter (Signed)
He isn't taking furosemide daily- see my note below- this is PRN and he hasn't had it in 2 months.

## 2022-03-02 NOTE — Telephone Encounter (Signed)
I spoke with the patient.  I advised him of Dr. Tyrell Antonio recommendations to: 1) Continue current medications for now 2) Come for CBC/ BMP to assess for anemia/ volume depletion  The patient voices understanding and is agreeable.  He is aware that he may come to the: [place] Entrance at [facility] 1st desk on the right to check in (REGISTRATION)  Lab hours: Monday- Friday (7:30 am- 5:30 pm)  I have advised him this is a non-fasting lab and he may come at his convenience to have this done.   He was very appreciative of the call back.

## 2022-03-02 NOTE — Telephone Encounter (Signed)
Okay I misunderstood.  Continue same medications for now but check CBC and basic metabolic profile to make sure he is not anemic or volume depleted.

## 2022-03-02 NOTE — Progress Notes (Signed)
[facility])                                            [place] Team    03/02/2022  Philip RUDDY Sr. 10-02-1945 [id number]                                      Medication Assistance Referral-FOR 2024 RE ENROLLMENT  Referral From: [facility] Embedded RPh Dorthula Perfect   Medication/Company: [name] / [employer] Patient application portion:  Mailed on 68/0/32 Provider application portion: Faxed  to Dr. [name] on 03/07/22 Provider address/fax verified via: Office website  Philip [name] P. Philip [name], [place]  [phone]

## 2022-03-02 NOTE — Telephone Encounter (Signed)
Given that he is taking furosemide daily, I recommend decreasing the dose to 20 mg daily.

## 2022-03-03 ENCOUNTER — Other Ambulatory Visit
Admission: RE | Admit: 2022-03-03 | Discharge: 2022-03-03 | Disposition: A | Discharge status: Home / Self Care | Payer: Medicare Other | Source: Ambulatory Visit | Attending: Cardiovascular Disease | Admitting: Cardiovascular Disease

## 2022-03-03 DIAGNOSIS — I959 Hypotension, unspecified: Secondary | ICD-10-CM | POA: Diagnosis not present

## 2022-03-03 LAB — CBC
HCT: 42.4 % (ref 39.0–52.0)
Hemoglobin: 13.8 g/dL (ref 13.0–17.0)
MCH: 27.9 pg (ref 26.0–34.0)
MCHC: 32.5 g/dL (ref 30.0–36.0)
MCV: 85.8 fL (ref 80.0–100.0)
Platelets: 196 10*3/uL (ref 150–400)
RBC: 4.94 MIL/uL (ref 4.22–5.81)
RDW: 14.3 % (ref 11.5–15.5)
WBC: 9.1 10*3/uL (ref 4.0–10.5)
nRBC: 0 % (ref 0.0–0.2)

## 2022-03-03 LAB — BASIC METABOLIC PANEL
Anion gap: 6 (ref 5–15)
BUN: 17 mg/dL (ref 8–23)
CO2: 26 mmol/L (ref 22–32)
Calcium: 9.2 mg/dL (ref 8.9–10.3)
Chloride: 110 mmol/L (ref 98–111)
Creatinine, Ser: 1.05 mg/dL (ref 0.61–1.24)
GFR, Estimated: >60 mL/min (ref >60)
Glucose, Bld: 195 mg/dL — ABNORMAL HIGH (ref 70–99)
Potassium: 4.3 mmol/L (ref 3.5–5.1)
Sodium: 142 mmol/L (ref 135–145)

## 2022-03-08 LAB — HM DIABETES EYE EXAM

## 2022-03-13 ENCOUNTER — Ambulatory Visit: Payer: Medicare Other | Admitting: Pharmacist

## 2022-03-13 ENCOUNTER — Encounter: Payer: Self-pay | Admitting: *Deleted

## 2022-03-13 DIAGNOSIS — E1142 Type 2 diabetes mellitus with diabetic polyneuropathy: Secondary | ICD-10-CM

## 2022-03-13 DIAGNOSIS — I129 Hypertensive chronic kidney disease with stage 1 through stage 4 chronic kidney disease, or unspecified chronic kidney disease: Secondary | ICD-10-CM

## 2022-03-13 NOTE — Patient Instructions (Signed)
Goals Addressed             This Visit's Progress    Pharmacy Goals       Our goal A1c is less than 7%. This corresponds with fasting sugars less than 130 and 2 hour after meal sugars less than 180. Please check your blood sugar and keep log of results  Please continue to monitor your home weight, blood pressure and heart rate  Our goal bad cholesterol, or LDL, is less than 70 . This is why it is important to continue taking your atorvastatin  Feel free to call me with any questions or concerns. I look forward to our next call!    Mickell Birdwell Orlanda Frankum, PharmD, BCACP Clinical Pharmacist [facility] [place] [phone]         

## 2022-03-13 NOTE — Progress Notes (Signed)
03/13/2022 Name: Philip [name] Sr. MRN: [id number] DOB: 12/04/1945  Chief Complaint  Patient presents with   Medication Assistance   Medication Management    Receive a voicemail from patient requesting a call back.  Return call to patient by telephone today.   Patient was referred to the pharmacist by their PCP for assistance in managing diabetes, hypertension, and medication access.   Subjective:  Care Team: Primary Care Provider: [name], DO Cardiologist: Philip Hampshire, MD; Next Scheduled Visit: 08/18/2022 Pulmonologist: Philip Pian, MD; Next Scheduled Visit: 08/17/2022  Medication Access/Adherence  Current Pharmacy:  [facility] [address] ([address]), [place] - [address] [place])  [address] Phone: [phone] Fax: [phone]   Patient reports affordability concerns with their medications: No   Patient reports access/transportation concerns to their pharmacy: No  Patient reports adherence concerns with their medications:  No      Type 2 Diabetes:   Current medications/Medication Access:  metformin 500 mg - 2 tablets (1,000 mg) twice daily as directed Farxiga 10 mg - 1 tablet QAM    Current medication access support: enrolled in patient assistance for Farxiga from [employer] through 04/30/2022  - Collaborating with [place] for assistance with re-enrollment in [place] patient assistance for 2024 calendar year - Today patient reports that he received application in mail from [place], but also received a letter from [employer] informing that he is automatically successfully re-enrolled in assistance program for 2024 calendar year  CHF/Hypertension:   Current medications:  Losartan 25 mg - 1 tablet daily Metoprolol 75 mg twice daily Spironolactone 25 mg - 1/2 tablet (12.5 mg) daily  Farxiga 10 mg - 1 tablets daily Furosemide 40 mg daily as needed as directed by Cardiologist Denies needing to  take furosemide recently based on daily weights or symptoms   Patient has an automated, upper arm home BP cuff Current blood pressure readings readings:  - Today: 135/89, HR 82 - Yesterday: 121/83, HR 78   Patient denies hypotensive s/sx including dizziness, lightheadedness     Objective: Lab Results  Component Value Date   HGBA1C 7.1 (H) 07/27/2021    Lab Results  Component Value Date   CREATININE 1.05 03/03/2022   BUN 17 03/03/2022   NA 142 03/03/2022   K 4.3 03/03/2022   CL 110 03/03/2022   CO2 26 03/03/2022    Lab Results  Component Value Date   CHOL 117 07/27/2021   HDL 50 07/27/2021   LDLCALC 49 07/27/2021   TRIG 95 07/27/2021   CHOLHDL 2.3 07/27/2021   BP Readings from Last 3 Encounters:  02/16/22 120/78  11/09/21 100/70  08/11/21 (!) 101/57   Pulse Readings from Last 3 Encounters:  02/16/22 70  11/09/21 70  08/11/21 60     Medications Reviewed Today     Reviewed by Philip [name], RPH-CPP (Pharmacist) on 03/01/22 at 1354  Med List Status: <None>   Medication Order Taking? Sig Documenting Provider Last Dose Status Informant  acetaminophen (TYLENOL) 500 MG tablet [id number]  Take 1,000 mg by mouth every 8 (eight) hours as needed for moderate pain or mild pain. [provider]  Active Self  Albuterol Sulfate 108 (90 Base) MCG/ACT AEPB [id number] Yes Inhale 2 puffs into the lungs every 6 (six) hours as needed (shortness of breath).  [provider] Taking Active Self  apixaban (ELIQUIS) 5 MG TABS tablet [id number] Yes Take 1 tablet by mouth twice daily Philip [name]  S, NP Taking Active   ARTIFICIAL TEAR SOLUTION OP [id number]  Place 1 drop into both eyes daily. [provider]  Active Self  atorvastatin (LIPITOR) 40 MG tablet [id number] Yes Take 1 tablet by mouth once daily Philip Hampshire, MD Taking Active   Blood Glucose Monitoring Suppl (ONE TOUCH ULTRA 2) w/Device KIT [id number]  Use to check blood sugar as advised up to  twice a day Philip Hauser, DO  Active Self  calcium carbonate (TUMS - DOSED IN MG ELEMENTAL CALCIUM) 500 MG chewable tablet [id number]  Chew 1,000 mg by mouth daily as needed for indigestion or heartburn. [provider]  Active Self  cetirizine (ZYRTEC) 10 MG tablet [id number] Yes Take 10 mg by mouth daily. [provider] Taking Active Self  cyanocobalamin (VITAMIN B12) 1000 MCG tablet [id number] Yes Take 1,000 mcg by mouth daily. [provider] Taking Active   FARXIGA 10 MG TABS tablet [id number] Yes Take 1 tablet (10 mg total) by mouth daily before breakfast. Philip Hauser, DO Taking Active Self  fluticasone (FLONASE) 50 MCG/ACT nasal spray [id number] Yes Place 2 sprays into both nostrils daily. Philip Hauser, DO Taking Active   furosemide (LASIX) 40 MG tablet [id number]  Take 0.5-1 tablets (20-40 mg total) by mouth daily as needed for edema. Only as needed now. Philip Hauser, DO  Active Self  gabapentin (NEURONTIN) 100 MG capsule [id number] Yes TAKE 1 CAPSULE BY MOUTH THREE TIMES DAILY Philip [name], Philip Doughty, DO Taking Active   ipratropium-albuterol (DUONEB) 0.5-2.5 (3) MG/3ML SOLN [id number]  Take 3 mLs by nebulization every 6 (six) hours as needed (shortness of breath). [provider]  Active Self           Med Note Philip [name] A   Fri Dec 24, 2020  4:04 PM)    Lanell Matar THIN MISC [id number]  1 Device by Does not apply route 4 (four) times daily -  before meals and at bedtime. Philip Isaac, MD  Active Self  levothyroxine (SYNTHROID) 88 MCG tablet [id number] Yes TAKE 1 TABLET BY MOUTH ONCE DAILY BEFORE BREAKFAST Philip [name], Philip Doughty, DO Taking Active   losartan (COZAAR) 25 MG tablet [id number] Yes Take 1 tablet (25 mg total) by mouth every morning. Philip Hampshire, MD Taking Active   metFORMIN (GLUCOPHAGE) 500 MG tablet [id number] Yes TAKE 2 TABLETS BY MOUTH TWICE DAILY WITH A MEAL Philip [name], Philip Lenna Sciara,  DO Taking Active   metoprolol tartrate (LOPRESSOR) 50 MG tablet [id number] Yes TAKE 1 & 1/2 (ONE & ONE-HALF) TABLETS BY MOUTH TWICE DAILY Philip Hampshire, MD Taking Active   montelukast (SINGULAIR) 10 MG tablet [id number] Yes Take 10 mg by mouth at bedtime. [provider] Taking Active Self  Philip [name] test strip [id number]  USE TO CHECK BLOOD SUGAR TWICE DAILY Philip Hauser, DO  Active   spironolactone (ALDACTONE) 25 MG tablet [id number] Yes Take 1/2 (one-half) tablet by mouth once daily Philip Hampshire, MD Taking Active   SYMBICORT 160-4.5 MCG/ACT inhaler [id number] Yes Inhale 2 puffs 2 (two) times daily into the lungs.  [provider] Taking Active Self              Assessment/Plan:   Diabetes: - Have counseled on importance of continuing to control carbohydrate portion sizes and using blood sugar checks to reinforce dietary choices - Recommend to continue to check glucose, keep log of results and to bring this record to medical appointments - Meets financial criteria for  re-enrollment in [place] patient assistance program through [employer].  Collaborating with provider, CPhT, and patient to pursue assistance Patient to send copy of letter that he received from [employer] regarding automatic re-enrollment in program for 2024 to [place] via mail     CHF/Hypertension: - Encourage patient to continue to weigh daily and and follow directions from Cardiologist for use of PRN furosemide (Lasix).  - Recommended to continue to check home blood pressure and heart rate, keep log of results and contact Cardiologist for BP and HR if needed for readings outside of established parameters or symptoms  Follow Up Plan: Clinical Pharmacist will follow up with patient by telephone on 05/05/2022 at 9:15 am  Wallace Cullens, PharmD, Para [place], [facility] [place] [phone]

## 2022-03-15 ENCOUNTER — Encounter: Payer: Self-pay | Admitting: Family Medicine

## 2022-04-03 ENCOUNTER — Telehealth: Payer: Medicare Other

## 2022-04-09 IMAGING — MR MR LUMBAR SPINE W/O CM
5 series · 31 of 48 positions shown · non-contrast
Comparison: Lumbar MRI 01/16/2018

CLINICAL DATA: Right hip and leg pain for months.

EXAM:
MRI LUMBAR SPINE WITHOUT CONTRAST
TECHNIQUE: Multiplanar, multisequence MR imaging of the lumbar spine was
performed. No intravenous contrast was administered.

[Series 5: T2 · sagittal · 4.0mm · 0.81mm/px · 7 of 17 slices shown (1 of 2)]
[im 1/17]
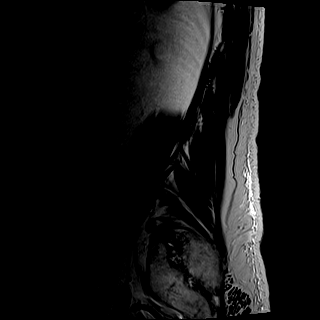
[im 3/17]
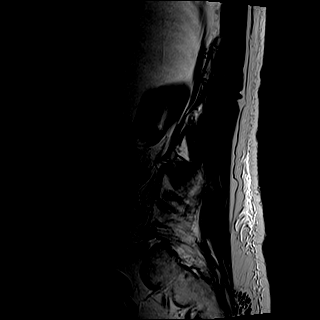
[im 6/17]
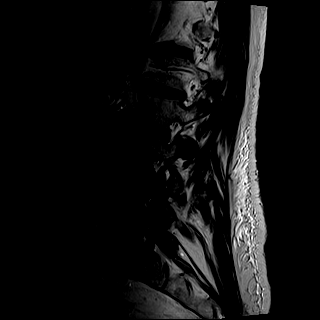
[im 9/17]
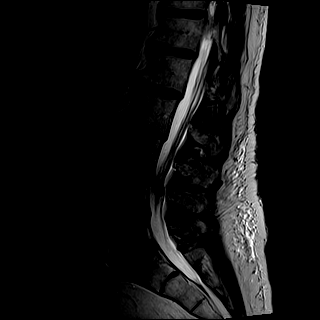
[im 11/17]
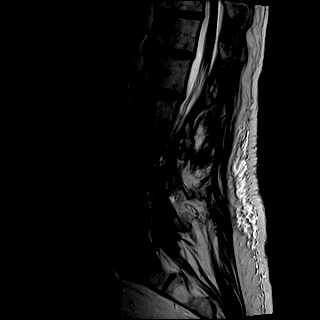
[im 14/17]
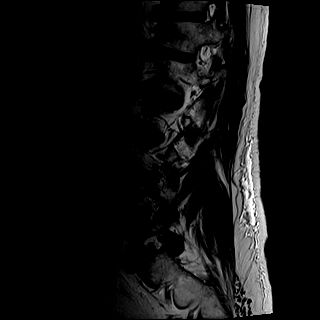
[im 17/17]
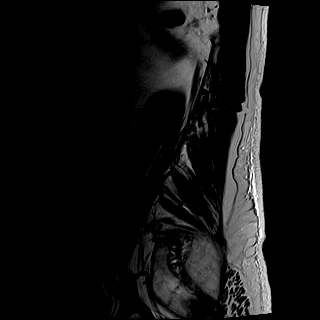

[Series 6: T1 · sagittal · 4.0mm · 0.81mm/px · 7 of 17 slices shown (1 of 2)]
[im 1/17]
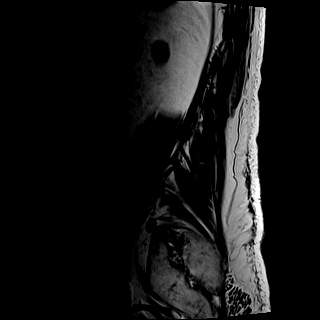
[im 3/17]
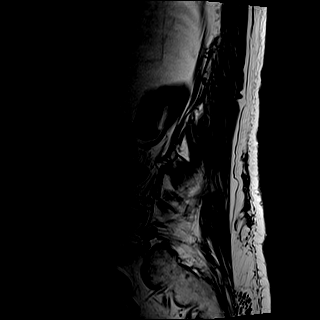
[im 6/17]
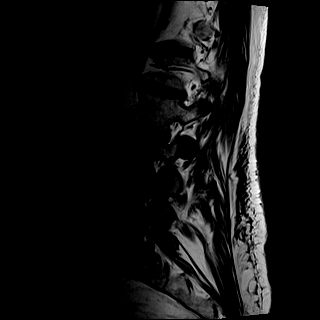
[im 9/17]
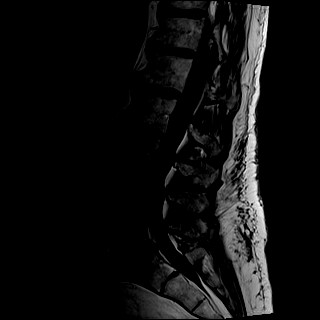
[im 11/17]
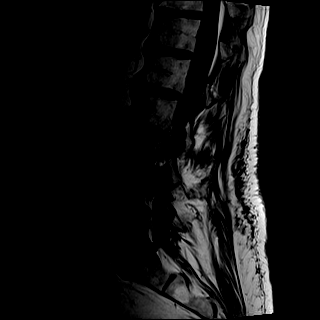
[im 14/17]
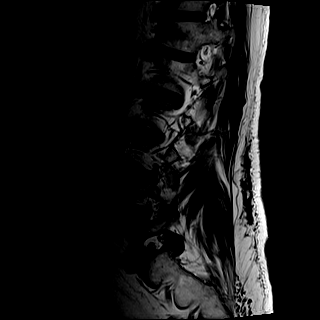
[im 17/17]
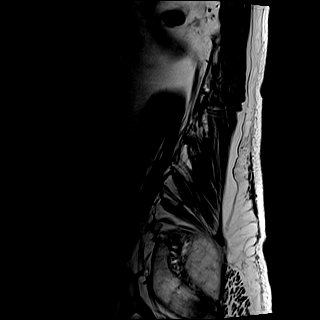

[Series 7: STIR · sagittal · 4.0mm · 0.41mm/px · 1 of 17 slices shown]
[im 1/17]
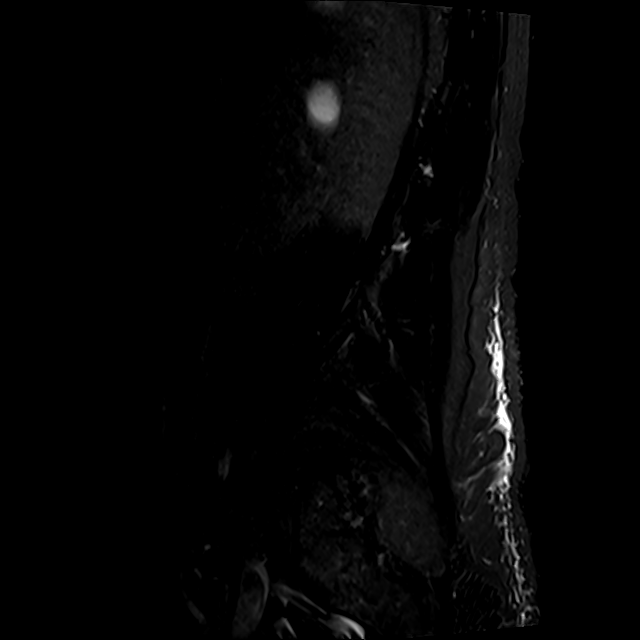

[Series 8: T2 · axial · 4.0mm · 0.78mm/px · z∈[-27,+195]mm · 8 of 38 slices shown (2 of 2)]
[im 1/38]
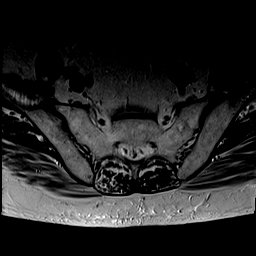
[im 6/38]
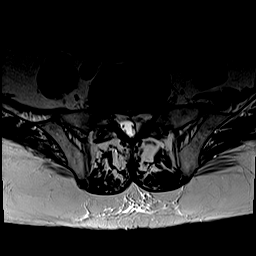
[im 12/38]
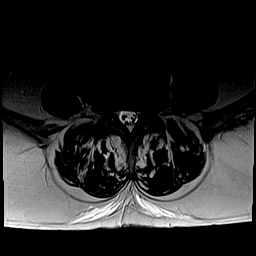
[im 18/38]
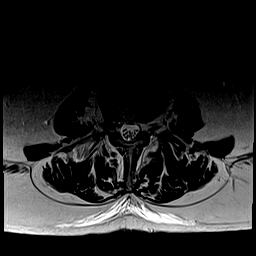
[im 20/38]
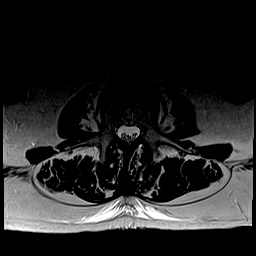
[im 26/38]
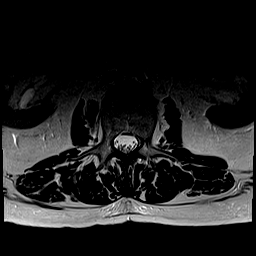
[im 32/38]
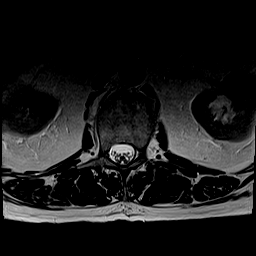
[im 38/38]
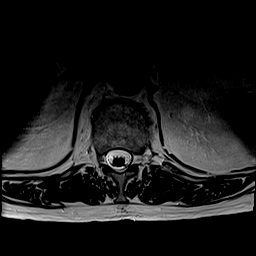

[Series 9: T1 · axial · 4.0mm · 0.39mm/px · z∈[-27,+195]mm · 8 of 38 slices shown (2 of 2)]
[im 1/38]
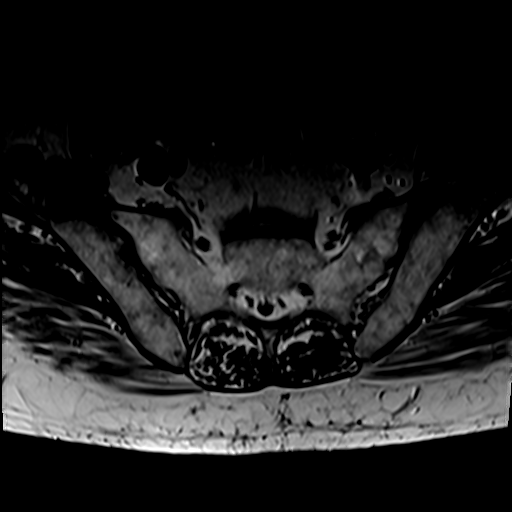
[im 6/38]
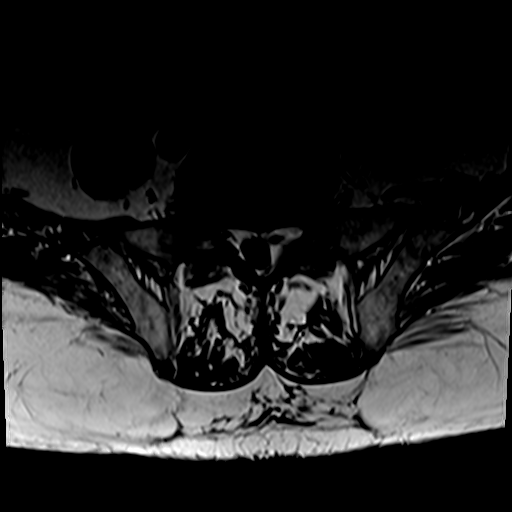
[im 12/38]
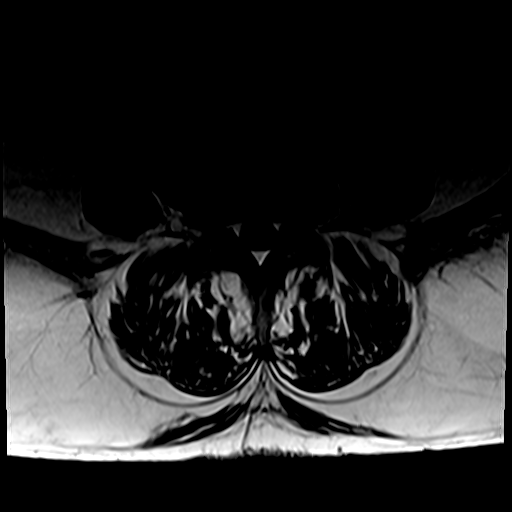
[im 18/38]
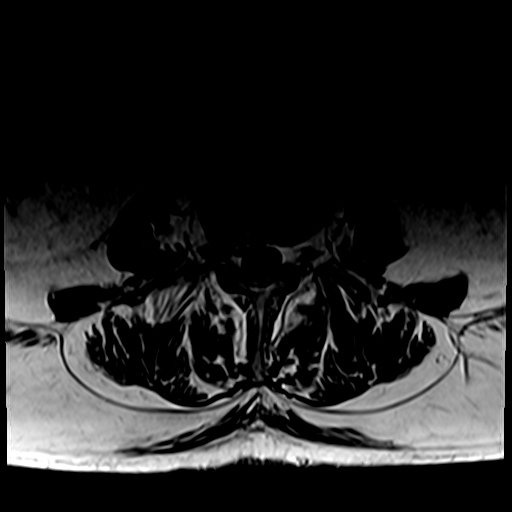
[im 20/38]
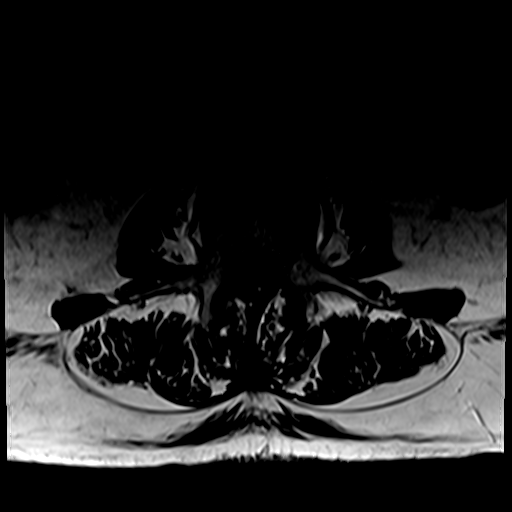
[im 26/38]
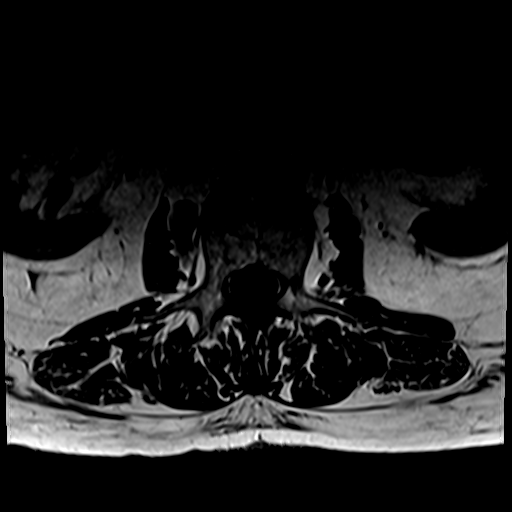
[im 32/38]
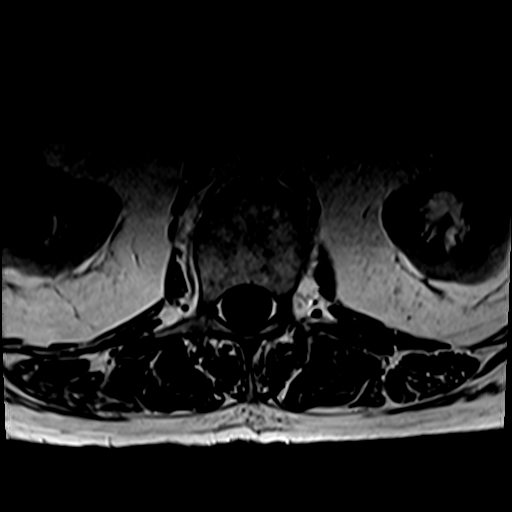
[im 38/38]
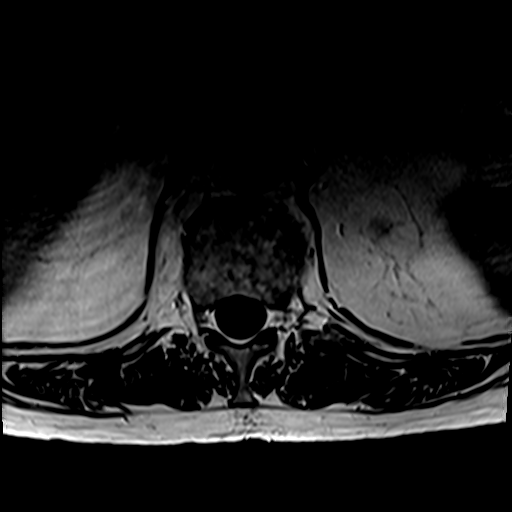

[31 of 48 positions shown; findings below may reference images not displayed]

FINDINGS: Segmentation:  Normal

Alignment:  Mild retrolisthesis L1-2, L2-3, L3-4

Vertebrae:  Normal bone marrow.  Negative for fracture or mass.

Conus medullaris and cauda equina: Conus extends to the L1-2 level.
Conus and cauda equina appear normal.

Paraspinal and other soft tissues: Negative for paraspinous mass or
adenopathy or fluid collection.

Disc levels:

L1-2: Mild disc and mild facet degeneration.  Negative for stenosis

L2-3: Mild disc and mild facet degeneration. Mild subarticular
stenosis on the left.

L3-4: Disc degeneration with diffuse disc bulging. 13 mm soft tissue
structure in the right lateral spinal canal below the disc space
most likely extruded disc fragment. Previously there was a right
foraminal disc protrusion which has progressed. I considered whether
this could be a complex synovial cyst however there is only mild
facet degeneration present at this level, and there is no fluid
signal within this abnormality. There is moderate spinal stenosis
which has progressed. Moderate left subarticular stenosis with
progression.

L4-5: Disc bulging and endplate spurring. Bilateral facet
degeneration. Mild to moderate subarticular stenosis bilaterally.
Mild spinal stenosis. No interval change.

L5-S1: Diffuse bulging of the disc with endplate spurring. Bilateral
facet hypertrophy left greater than right. Moderate subarticular and
foraminal stenosis on the left. Mild subarticular stenosis on the
right. No interval change.
IMPRESSION: 1. Moderate spinal stenosis at L3-4 has progressed. Extruded disc
fragment on the right with downgoing disc material contributing to
spinal stenosis and right L4 nerve root compression.
2. Mild to moderate subarticular stenosis bilaterally L4-5 with mild
spinal stenosis
3. Moderate subarticular foraminal stenosis on the left at L5-S1
unchanged.

## 2022-04-11 ENCOUNTER — Telehealth: Payer: Self-pay | Admitting: Pharmacy Technician

## 2022-04-11 DIAGNOSIS — Z596 Low income: Secondary | ICD-10-CM

## 2022-04-11 NOTE — Progress Notes (Signed)
[facility])                                            [place] Team    04/11/2022  Philip ARNALL Sr. December 17, 1945 [id number]  Incoming mail received from patient in regard to [employer] application for [name]  The letter informs that the patient is APPROVED 05/01/22-05/01/23. Faxed into [employer] an update prescription for use in 2024. Medication will be automatically be refilled and  delivered to the patient's home address onfile with [employer]ME as refills come due.  Taliya Mcclard P. Alana [place], [place]  [phone]

## 2022-04-25 ENCOUNTER — Ambulatory Visit: Payer: Self-pay

## 2022-04-25 NOTE — Telephone Encounter (Signed)
Patient states that he has body aches, congestion, headache, fever. Patient thinks he may have covid.     Chief Complaint: Cough,fever,wheezing,exposure to [place]. Symptoms: Above Frequency: 4 days ago Pertinent Negatives: Patient denies  Disposition: '[]'$ ED /'[]'$ Urgent Care (no appt availability in office) / '[x]'$ Appointment(In office/virtual)/ '[]'$  [facility] [facility]/ '[]'$ Home Care/ '[]'$ Refused Recommended Disposition /'[]'$ [facility]/ '[]'$  Follow-up with PCP Additional Notes: Go to ED for worsening of symptoms.  Reason for Disposition  [1] Continuous (nonstop) coughing interferes with work or school AND [2] no improvement using cough treatment per Care Advice  Answer Assessment - Initial Assessment Questions 1. ONSET: "When did the cough begin?"      4 days ago 2. SEVERITY: "How bad is the cough today?"      Severe 3. SPUTUM: "Describe the color of your sputum" (none, dry cough; clear, white, yellow, green)     Dark 4. HEMOPTYSIS: "Are you coughing up any blood?" If so ask: "How much?" (flecks, streaks, tablespoons, etc.)     No 5. DIFFICULTY BREATHING: "Are you having difficulty breathing?" If Yes, ask: "How bad is it?" (e.g., mild, moderate, severe)    - MILD: No SOB at rest, mild SOB with walking, speaks normally in sentences, can lie down, no retractions, pulse < 100.    - MODERATE: SOB at rest, SOB with minimal exertion and prefers to sit, cannot lie down flat, speaks in phrases, mild retractions, audible wheezing, pulse 100-120.    - SEVERE: Very SOB at rest, speaks in single words, struggling to breathe, sitting hunched forward, retractions, pulse > 120      Mild 6. FEVER: "Do you have a fever?" If Yes, ask: "What is your temperature, how was it measured, and when did it start?"     Yes 7. CARDIAC HISTORY: "Do you have any history of heart disease?" (e.g., heart attack, congestive heart failure)      No 8. LUNG HISTORY: "Do you have any history of lung disease?"  (e.g.,  pulmonary embolus, asthma, emphysema)     COPD 9. PE RISK FACTORS: "Do you have a history of blood clots?" (or: recent major surgery, recent prolonged travel, bedridden)     nO 10. OTHER SYMPTOMS: "Do you have any other symptoms?" (e.g., runny nose, wheezing, chest pain)       Wheezing 11. PREGNANCY: "Is there any chance you are pregnant?" "When was your last menstrual period?"       N/a 12. TRAVEL: "Have you traveled out of the country in the last month?" (e.g., travel history, exposures)       No  Protocols used: Cough - Acute Productive-A-AH

## 2022-04-26 ENCOUNTER — Telehealth: Payer: Self-pay | Admitting: Family Medicine

## 2022-04-26 ENCOUNTER — Ambulatory Visit: Payer: Medicare Other | Admitting: Internal Medicine

## 2022-04-26 NOTE — Telephone Encounter (Signed)
PEC agent states pt. Did not understand he had an appointment today. States he is "feeling a little better." PEC agent will make appointment for pt.

## 2022-04-26 NOTE — Progress Notes (Deleted)
Subjective:    Patient ID: Philip Potters Sr., adult    DOB: 10/23/1945, 76 y.o.   MRN: [id number]  HPI  Patient presents to clinic today with complaint of fever, headache and cough.  This started 4 days ago.  He has a history of COPD managed on Symbicort and Albuterol.  Review of Systems     Past Medical History:  Diagnosis Date   (HFpEF) heart failure with preserved ejection fraction ([place])    a. 06/2015 Echo: EF 50-55%, Gr1 DD. Mild MR. Mildly dil LA. Nl RV fxn. Nl PASP.   Arthritis    a. right knee   Asthma    Atrial tachycardia    Chronic anticoagulation    Apixaban   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 etiology.   COPD (chronic obstructive pulmonary disease) (HCC)    Coronary artery disease    a. 05/2012 Cath: severe 3VD-->CABG x 3 by Dr. Servando Snare in 01/07/2014[place], SVG->LCX & SVG->RPDA; b. 06/2014 MV: no ischemia/infarct.   DDD (degenerative disc disease), lumbar    Dyspnea    Edema    LEGS/ FEET   Essential hypertension    History of Helicobacter pylori infection    HNP (herniated nucleus pulposus), lumbar    HOH (hard of hearing)    Hypercholesteremia    Hypothyroidism    Low back pain radiating to lower extremity    Lumbar radiculitis    Lumbar stenosis with neurogenic claudication    Neuropathy    PAF (paroxysmal atrial fibrillation) (HCC)    a. CHA2DS2VASc = 5 (age, CHF, HTN, vascular disase, T2DM) --> Eliquis.   Sepsis ([place]) 2021   Shingles    Sleep apnea    NO CPAP   Type II diabetes mellitus (HCC)     Current Outpatient Medications  Medication Sig Dispense Refill   acetaminophen (TYLENOL) 500 MG tablet Take 1,000 mg by mouth every 8 (eight) hours as needed for moderate pain or mild pain.     Albuterol Sulfate 108 (90 Base) MCG/ACT AEPB Inhale 2 puffs into the lungs every 6 (six) hours as needed (shortness of breath).      apixaban (ELIQUIS) 5 MG TABS tablet Take 1 tablet by mouth twice daily 60 tablet 5   ARTIFICIAL TEAR SOLUTION OP Place  1 drop into both eyes daily.     atorvastatin (LIPITOR) 40 MG tablet Take 1 tablet by mouth once daily 90 tablet 3   Blood Glucose Monitoring Suppl (ONE TOUCH ULTRA 2) w/Device KIT Use to check blood sugar as advised up to twice a day 1 kit 0   calcium carbonate (TUMS - DOSED IN MG ELEMENTAL CALCIUM) 500 MG chewable tablet Chew 1,000 mg by mouth daily as needed for indigestion or heartburn.     cetirizine (ZYRTEC) 10 MG tablet Take 10 mg by mouth daily.     cyanocobalamin (VITAMIN B12) 1000 MCG tablet Take 1,000 mcg by mouth daily.     FARXIGA 10 MG TABS tablet Take 1 tablet (10 mg total) by mouth daily before breakfast. 90 tablet 3   fluticasone (FLONASE) 50 MCG/ACT nasal spray Place 2 sprays into both nostrils daily. 16 g 3   furosemide (LASIX) 40 MG tablet Take 0.5-1 tablets (20-40 mg total) by mouth daily as needed for edema. Only as needed now. 45 tablet 3   gabapentin (NEURONTIN) 100 MG capsule TAKE 1 CAPSULE BY MOUTH THREE TIMES DAILY 270 capsule 0   ipratropium-albuterol (DUONEB) 0.5-2.5 (  3) MG/3ML SOLN Take 3 mLs by nebulization every 6 (six) hours as needed (shortness of breath).     LANCETS ULTRA THIN MISC 1 Device by Does not apply route 4 (four) times daily -  before meals and at bedtime. 200 each 5   levothyroxine (SYNTHROID) 88 MCG tablet TAKE 1 TABLET BY MOUTH ONCE DAILY BEFORE BREAKFAST 90 tablet 0   losartan (COZAAR) 25 MG tablet Take 1 tablet (25 mg total) by mouth every morning. 90 tablet 3   metFORMIN (GLUCOPHAGE) 500 MG tablet TAKE 2 TABLETS BY MOUTH TWICE DAILY WITH A MEAL 360 tablet 1   metoprolol tartrate (LOPRESSOR) 50 MG tablet TAKE 1 & 1/2 (ONE & ONE-HALF) TABLETS BY MOUTH TWICE DAILY 270 tablet 2   montelukast (SINGULAIR) 10 MG tablet Take 10 mg by mouth at bedtime.     ONETOUCH ULTRA test strip USE TO CHECK BLOOD SUGAR TWICE DAILY 200 each 6   spironolactone (ALDACTONE) 25 MG tablet Take 1/2 (one-half) tablet by mouth once daily 45 tablet 1   SYMBICORT 160-4.5 MCG/ACT  inhaler Inhale 2 puffs 2 (two) times daily into the lungs.      No current facility-administered medications for this visit.    Allergies  Allergen Reactions   Bydureon [Exenatide] Anaphylaxis    Possible from bydureon, not 100% confirmed   Clindamycin/Lincomycin Hives and Itching   Penicillins Hives, Itching, Rash and Other (See Comments)    Has patient had a PCN reaction causing immediate rash, facial/tongue/throat swelling, SOB or lightheadedness with hypotension: Yes Has patient had a PCN reaction causing severe rash involving mucus membranes or skin necrosis: No Has patient had a PCN reaction that required hospitalization: No Has patient had a PCN reaction occurring within the last 10 years: No If all of the above answers are "NO", then may proceed with Cephalosporin use.   Sulfa Antibiotics Shortness Of Breath   Tiotropium Shortness Of Breath and Swelling   Penicillin G Hives    Joint stiffness   Spiriferis Other (See Comments)    Affected breathing   Spiriva Handihaler [Tiotropium Bromide Monohydrate]     Affected breathing   Iodinated Contrast Media Rash and Other (See Comments)    Redness Redness   Other Rash    Stress test dye    Family History  Problem Relation Age of Onset   Heart disease Mother    Clotting disorder Mother    Hypertension Sister     Social History   Socioeconomic History   Marital status: Married    Spouse name: Harle Battiest   Number of children: 4   Years of education: Not on file   Highest education level: Some college, no degree  Occupational History   Occupation: retired  Tobacco Use   Smoking status: Former    Packs/day: 1.00    Years: 8.00    Total pack years: 8.00    Types: Cigarettes    Quit date: 12/31/1967    Years since quitting: 54.3   Smokeless tobacco: Former  Scientific laboratory technician Use: Never used  Substance and Sexual Activity   Alcohol use: No    Alcohol/week: 0.0 standard drinks of alcohol    Comment: 05/06/2012 "last  alcohol several years ago; never had problem wit"   Drug use: No   Sexual activity: Not Currently  Other Topics Concern   Not on file  Social History Narrative   ** Merged History Encounter **  Working part time - not currently working because of covid-19    Social Determinants of Radio broadcast assistant Strain: Low Risk  (08/11/2021)   Overall Financial Resource Strain (CARDIA)    Difficulty of Paying Living Expenses: Not hard at all  Food Insecurity: No Food Insecurity (08/11/2021)   Hunger Vital Sign    Worried About Running Out of Food in the Last Year: Never true    [place] in the Last Year: Never true  Transportation Needs: No Transportation Needs (08/11/2021)   PRAPARE - Hydrologist (Medical): No    Lack of Transportation (Non-Medical): No  Physical Activity: Insufficiently Active (08/11/2021)   Exercise Vital Sign    Days of Exercise per Week: 7 days    Minutes of Exercise per Session: 10 min  Stress: No Stress Concern Present (08/11/2021)   [place]    Feeling of Stress : Not at all  Social Connections: [place] (08/11/2021)   Social Connection and Isolation Panel [NHANES]    Frequency of Communication with Friends and Family: More than three times a week    Frequency of Social Gatherings with Friends and Family: More than three times a week    Attends Religious Services: More than 4 times per year    Active Member of [employer] or Organizations: Yes    Attends Archivist Meetings: More than 4 times per year    Marital Status: Married  Human resources officer Violence: Not At Risk (08/11/2021)   Humiliation, Afraid, Rape, and Kick questionnaire    Fear of Current or Ex-Partner: No    Emotionally Abused: No    Physically Abused: No    Sexually Abused: No     Constitutional: Patient reports fever and headache.  Denies malaise, fatigue, or abrupt  weight changes.  HEENT: Denies eye pain, eye redness, ear pain, ringing in the ears, wax buildup, runny nose, nasal congestion, bloody nose, or sore throat. Respiratory: Patient reports cough.  Denies difficulty breathing, shortness of breath, or sputum production.   Cardiovascular: Denies chest pain, chest tightness, palpitations or swelling in the hands or feet.  Gastrointestinal: Denies abdominal pain, bloating, constipation, diarrhea or blood in the stool.  GU: Denies urgency, frequency, pain with urination, burning sensation, blood in urine, odor or discharge. Musculoskeletal: Denies decrease in range of motion, difficulty with gait, muscle pain or joint pain and swelling.  Skin: Denies redness, rashes, lesions or ulcercations.  Neurological: Denies dizziness, difficulty with memory, difficulty with speech or problems with balance and coordination.  Psych: Denies anxiety, depression, SI/HI.  No other specific complaints in a complete review of systems (except as listed in HPI above).  Objective:   Physical Exam  There were no vitals taken for this visit. Wt Readings from Last 3 Encounters:  02/16/22 189 lb 6 oz (85.9 kg)  11/09/21 186 lb 3.2 oz (84.5 kg)  08/11/21 192 lb (87.1 kg)    General: Appears their stated age, well developed, well nourished in NAD. Skin: Warm, dry and intact. No rashes, lesions or ulcerations noted. HEENT: Head: normal shape and size; Eyes: sclera white, no icterus, conjunctiva pink, PERRLA and EOMs intact; Ears: Tm's gray and intact, normal light reflex; Nose: mucosa pink and moist, septum midline; Throat/Mouth: Teeth present, mucosa pink and moist, no exudate, lesions or ulcerations noted.  Neck:  Neck supple, trachea midline. No masses, lumps or thyromegaly present.  Cardiovascular: Normal rate  and rhythm. S1,S2 noted.  No murmur, rubs or gallops noted. No JVD or BLE edema. No carotid bruits noted. Pulmonary/Chest: Normal effort and positive vesicular  breath sounds. No respiratory distress. No wheezes, rales or ronchi noted.  Abdomen: Soft and nontender. Normal bowel sounds. No distention or masses noted. Liver, spleen and kidneys non palpable. Musculoskeletal: Normal range of motion. No signs of joint swelling. No difficulty with gait.  Neurological: Alert and oriented. Cranial nerves II-XII grossly intact. Coordination normal.  Psychiatric: Mood and affect normal. Behavior is normal. Judgment and thought content normal.    BMET    Component Value Date/Time   NA 142 03/03/2022 1552   NA 141 05/31/2020 0903   K 4.3 03/03/2022 1552   CL 110 03/03/2022 1552   CO2 26 03/03/2022 1552   GLUCOSE 195 (H) 03/03/2022 1552   BUN 17 03/03/2022 1552   BUN 24 05/31/2020 0903   CREATININE 1.05 03/03/2022 1552   CREATININE 1.13 07/27/2021 0756   CALCIUM 9.2 03/03/2022 1552   GFRNONAA >60 03/03/2022 1552   GFRNONAA 74 06/24/2018 0846   GFRAA 67 05/31/2020 0903   GFRAA 85 06/24/2018 0846    Lipid Panel     Component Value Date/Time   CHOL 117 07/27/2021 0756   CHOL 139 12/14/2014 0834   TRIG 95 07/27/2021 0756   HDL 50 07/27/2021 0756   HDL 43 12/14/2014 0834   CHOLHDL 2.3 07/27/2021 0756   VLDL 20 11/21/2019 0915   LDLCALC 49 07/27/2021 0756    CBC    Component Value Date/Time   WBC 9.1 03/03/2022 1552   RBC 4.94 03/03/2022 1552   HGB 13.8 03/03/2022 1552   HGB 15.2 05/31/2020 0903   HCT 42.4 03/03/2022 1552   HCT 45.2 05/31/2020 0903   PLT 196 03/03/2022 1552   PLT 206 05/31/2020 0903   MCV 85.8 03/03/2022 1552   MCV 87 05/31/2020 0903   MCH 27.9 03/03/2022 1552   MCHC 32.5 03/03/2022 1552   RDW 14.3 03/03/2022 1552   RDW 12.9 05/31/2020 0903   LYMPHSABS 1,314 07/27/2021 0756   LYMPHSABS 1.0 05/31/2020 0903   MONOABS 1.6 (H) 06/27/2019 0508   EOSABS 316 07/27/2021 0756   EOSABS 0.0 05/31/2020 0903   BASOSABS 81 07/27/2021 0756   BASOSABS 0.0 05/31/2020 0903    Hgb A1C Lab Results  Component Value Date   HGBA1C  7.1 (H) 07/27/2021            Assessment & Plan:     Follow-up with your PCP as previously scheduled Webb Silversmith, NP

## 2022-04-28 ENCOUNTER — Ambulatory Visit (INDEPENDENT_AMBULATORY_CARE_PROVIDER_SITE_OTHER): Payer: Medicare Other | Admitting: Family Medicine

## 2022-04-28 VITALS — BP 98/68 | HR 110 | Resp 18 | Ht 70.0 in | Wt 184.2 lb

## 2022-04-28 DIAGNOSIS — J440 Chronic obstructive pulmonary disease with acute lower respiratory infection: Secondary | ICD-10-CM | POA: Diagnosis not present

## 2022-04-28 DIAGNOSIS — J0110 Acute frontal sinusitis, unspecified: Secondary | ICD-10-CM

## 2022-04-28 DIAGNOSIS — J209 Acute bronchitis, unspecified: Secondary | ICD-10-CM

## 2022-04-28 MED ORDER — FLUTICASONE PROPIONATE 50 MCG/ACT NA SUSP: 2.0000 | Freq: Every day | NASAL | 3 refills | Status: DC

## 2022-04-28 MED ORDER — LEVOFLOXACIN 500 MG PO TABS: 500.0000 mg | ORAL_TABLET | Freq: Every day | ORAL | 0 refills | Status: DC

## 2022-04-28 MED ORDER — HYDROCOD POLI-CHLORPHE POLI ER 10-8 MG/5ML PO SUER: 5.0000 mL | Freq: Two times a day (BID) | ORAL | 0 refills | Status: DC | PRN

## 2022-04-28 MED ORDER — PREDNISONE 20 MG PO TABS: ORAL_TABLET | ORAL | 0 refills | Status: DC

## 2022-04-28 NOTE — Patient Instructions (Addendum)
Thank you for coming to the office today.  Likely Acute COPD Flare  Start taking Levaquin antibiotic '500mg'$  daily x 7 days  Start Prednisone taper 7 days  Start nasal steroid Flonase 2 sprays in each nostril daily for 4-6 weeks, may repeat course seasonally or as needed  Tussionex cough syrup  Goal is to improve oxygen and cough to help improve breathing.  Wife may use Tamiflu medicine daily to prevent for 10 days or twice a day for symptoms if develops but I recommend Urgent Care testing for her.  Please schedule a Follow-up Appointment to: Return if symptoms worsen or fail to improve.  If you have any other questions or concerns, please feel free to call the office or send a message through [place]. You may also schedule an earlier appointment if necessary.  Additionally, you may be receiving a survey about your experience at our office within a few days to 1 week by e-mail or mail. We value your feedback.  [name], DO [place]

## 2022-04-28 NOTE — Progress Notes (Signed)
Subjective:    Patient ID: Philip Potters Sr., adult    DOB: [birth year], 76 y.o.   MRN: [id number]  Philip BODIE Sr. is a 76 y.o. adult presenting on 04/28/2022 for COPD (2 weeks fatigue, congestion)   HPI  Acute COPD Exacerbation Possible Viral Syndrome acute Recent onset 2 weeks ago with sinus congestion drainage cough dyspnea. Unsure exact cause, and did do home COVID testing negative. He had initially thicker congestion that was more productive. Also impacting his appetite with reduced appetite, poor intake overall. Weight loss 5 lbs in past 1-2 months. Improving now recently but still fatigue and weak - Admits coughing spell worse at times He has been taking Tylenol Using Symbicort 2 puff TWICE A DAY, Albuterol AS NEEDED Using Flonase Denies fever chills      04/28/2022    3:19 PM 08/11/2021    1:18 PM 01/31/2021    1:57 PM  Depression screen PHQ 2/9  Decreased Interest 0 0 3  Down, Depressed, Hopeless 0 0 0  PHQ - 2 Score 0 0 3  Altered sleeping 0  3  Tired, decreased energy 0  3  Change in appetite 0  3  Feeling bad or failure about yourself  0  0  Trouble concentrating 0  0  Moving slowly or fidgety/restless 0  0  Suicidal thoughts 0  0  PHQ-9 Score 0  12  Difficult doing work/chores Not difficult at all  Not difficult at all    Social History   Tobacco Use   Smoking status: Former    Packs/day: 1.00    Years: 8.00    Total pack years: 8.00    Types: Cigarettes    Quit date: 12/31/1967    Years since quitting: 54.3   Smokeless tobacco: Former  Scientific laboratory technician Use: Never used  Substance Use Topics   Alcohol use: No    Alcohol/week: 0.0 standard drinks of alcohol    Comment: 05/06/2012 "last alcohol several years ago; never had problem wit"   Drug use: No    Review of Systems Per HPI unless specifically indicated above     Objective:    BP 98/68 (BP Location: Right Arm, Patient Position: Sitting, Cuff Size: Normal)   Pulse (!) 110   Resp 18    Ht '5\' 10"'$  (1.778 m)   Wt 184 lb 3.2 oz (83.6 kg)   SpO2 90%   BMI 26.43 kg/m   Wt Readings from Last 3 Encounters:  04/28/22 184 lb 3.2 oz (83.6 kg)  02/16/22 189 lb 6 oz (85.9 kg)  11/09/21 186 lb 3.2 oz (84.5 kg)    Physical Exam Vitals and nursing note reviewed.  Constitutional:      General: He is not in acute distress.    Appearance: He is well-developed. He is not diaphoretic.     Comments: Well-appearing, comfortable, cooperative  HENT:     Head: Normocephalic and atraumatic.  Eyes:     General:        Right eye: No discharge.        Left eye: No discharge.     Conjunctiva/sclera: Conjunctivae normal.  Neck:     Thyroid: No thyromegaly.  Cardiovascular:     Rate and Rhythm: Normal rate and regular rhythm.     Pulses: Normal pulses.     Heart sounds: Normal heart sounds. No murmur heard. Pulmonary:     Effort: Pulmonary effort is normal. No respiratory distress.  Breath sounds: Wheezing and rhonchi present. No rales.  Musculoskeletal:        General: Normal range of motion.     Cervical back: Normal range of motion and neck supple.  Lymphadenopathy:     Cervical: No cervical adenopathy.  Skin:    General: Skin is warm and dry.     Findings: No erythema or rash.  Neurological:     Mental Status: He is alert and oriented to person, place, and time. Mental status is at baseline.  Psychiatric:        Behavior: Behavior normal.     Comments: Well groomed, good eye contact, normal speech and thoughts      Results for orders placed or performed in visit on 03/15/22  HM DIABETES EYE EXAM  Result Value Ref Range   HM Diabetic Eye Exam No Retinopathy No Retinopathy      Assessment & Plan:   Problem List Items Addressed This Visit   None Visit Diagnoses     Acute bronchitis with COPD ([place])    -  Primary   Relevant Medications   fluticasone (FLONASE) 50 MCG/ACT nasal spray   levofloxacin (LEVAQUIN) 500 MG tablet   predniSONE (DELTASONE) 20 MG tablet    chlorpheniramine-HYDROcodone (TUSSIONEX) 10-8 MG/5ML   Acute non-recurrent frontal sinusitis       Relevant Medications   fluticasone (FLONASE) 50 MCG/ACT nasal spray   levofloxacin (LEVAQUIN) 500 MG tablet   predniSONE (DELTASONE) 20 MG tablet   chlorpheniramine-HYDROcodone (TUSSIONEX) 10-8 MG/5ML       Likely Acute COPD Flare Secondary to recent viral infection vs flu Some improvement now, reassuring  Pulse ox 90%, discussed. He has home O2 monitor. Caution if < 90% persistently.  Start taking Levaquin antibiotic '500mg'$  daily x 7 days  Start Prednisone taper 7 days  Start nasal steroid Flonase 2 sprays in each nostril daily for 4-6 weeks, may repeat course seasonally or as needed  Tussionex cough syrup  Goal is to improve oxygen and cough to help improve breathing.  Switch off Symbicort for a week, use sample Breztri 2 puff TWICE A DAY, may switch to this med if he does better, notify office we can order [name] if need.  Keep using Albuterol PRN  Wife may use Tamiflu medicine daily to prevent for 10 days or twice a day for symptoms if develops but I recommend Urgent Care testing for her.   Meds ordered this encounter  Medications   fluticasone (FLONASE) 50 MCG/ACT nasal spray    Sig: Place 2 sprays into both nostrils daily.    Dispense:  16 g    Refill:  3   levofloxacin (LEVAQUIN) 500 MG tablet    Sig: Take 1 tablet (500 mg total) by mouth daily. For 7 days    Dispense:  7 tablet    Refill:  0   predniSONE (DELTASONE) 20 MG tablet    Sig: Take daily with food. Start with '60mg'$  (3 pills) x 2 days, then reduce to '40mg'$  (2 pills) x 2 days, then '20mg'$  (1 pill) x 3 days    Dispense:  13 tablet    Refill:  0   chlorpheniramine-HYDROcodone (TUSSIONEX) 10-8 MG/5ML    Sig: Take 5 mLs by mouth every 12 (twelve) hours as needed for cough.    Dispense:  115 mL    Refill:  0    Follow up plan: Return if symptoms worsen or fail to improve.   [name], DO [facility] 04/28/2022, 3:48 PM

## 2022-05-05 ENCOUNTER — Ambulatory Visit: Payer: Medicare Other | Admitting: Pharmacist

## 2022-05-05 DIAGNOSIS — I129 Hypertensive chronic kidney disease with stage 1 through stage 4 chronic kidney disease, or unspecified chronic kidney disease: Secondary | ICD-10-CM

## 2022-05-05 DIAGNOSIS — E1142 Type 2 diabetes mellitus with diabetic polyneuropathy: Secondary | ICD-10-CM

## 2022-05-05 DIAGNOSIS — J432 Centrilobular emphysema: Secondary | ICD-10-CM

## 2022-05-05 NOTE — Progress Notes (Signed)
05/05/2022 Name: Philip MCNEE Sr. MRN: [id number] DOB: 1946/03/31  Chief Complaint  Patient presents with   Chronic Care Management    Patient Phone Call    Philip MOORE Sr. is a 77 y.o. year old adult who presented for a telephone visit.   They were referred to the pharmacist by their PCP for assistance in managing diabetes, hypertension, hyperlipidemia, and medication access.    Subjective:  Perform chart review. Patient seen for Office Visit with PCP on 12/29 related to COPD exacerbation. Provider advised patient: -Start taking Levaquin antibiotic '500mg'$  daily x 7 days  -Start Prednisone taper 7 days  -Start nasal steroid Flonase 2 sprays in each nostril daily for 4-6 weeks, may repeat course seasonally or as needed  -Tussionex cough syrup prescribed, to use as needed - Switch off Symbicort for a week, use sample Breztri 2 puff TWICE A DAY, may switch to this med if he does better, notify office we can order [name] if need  - Keep using Albuterol PRN   Care Team: Primary Care Provider: [name], DO; Next Scheduled Visit: 08/10/2022 Cardiologist: [name], MD; Next Scheduled Visit: 08/18/2022 Pulmonologist: Erby Pian, MD; Next Scheduled Visit: 08/17/2022  Medication Access/Adherence  Current Pharmacy:  [facility] [address] ([address]), [place] - [address] [place])  [address] Phone: [phone] Fax: [phone]   Patient reports affordability concerns with their medications: No  Patient reports access/transportation concerns to their pharmacy: No  Patient reports adherence concerns with their medications:  No    COPD/seasonal allergies: Patient follows with Dr. Raul Del at [facility]   Today reports breathing and cough have improved with completing courses (today) of prednisone and Levaquin as prescribed by PCP on 12/29   Current medications: Using Breztri 2 puffs  twice daily for now from sample from PCP (previously on Symbicort 160/4.5 two puffs bid) Montelukast 10 mg QHS Albuterol inhaler as needed Cetirizine 10 mg daily Flonase nasal spray as needed   Reports trialing Breztri maintenance (Breztri) inhaler twice daily from sample as provided by PCP in place of Symbicort inhaler. Reports feels like he has noticed an improvement in his breathing, but also noticing increased dry mouth.  - Confirms consistently and rinsing mouth out after each use - Reports would like to continue to trial [facility] for now to decide if would like to continue or go back to using Symbicort  Type 2 Diabetes:   Current medications/Medication Access:  metformin 500 mg - 2 tablets (1,000 mg) twice daily as directed Farxiga 10 mg - 1 tablet QAM    Current glucose readings:  Fasting Blood Glucose After Supper/Bedtime  30 - December 134 219  31 - December 194 322  1 - January 138 222  2 - January 159 208  3 - January 127 134  4 - January 159 273  5 - January 123   Attributes elevated readings to recent prednisone course (completed today)  Current medication access support: enrolled in patient assistance for Farxiga from [employer] through 05/01/2023    CHF/Hypertension:   Current medications:  Losartan 25 mg - 1 tablet daily Metoprolol 75 mg twice daily Spironolactone 25 mg - 1/2 tablet (12.5 mg) daily  Farxiga 10 mg - 1 tablets daily Furosemide 40 mg daily as needed as directed by Cardiologist Denies needing to take furosemide recently based on daily weights or symptoms   Patient has an automated, upper arm home BP cuff  Current blood pressure readings readings:  - Today: 107/78, HR 77   Objective: Lab Results  Component Value Date   HGBA1C 7.1 (H) 07/27/2021    Lab Results  Component Value Date   CREATININE 1.05 03/03/2022   BUN 17 03/03/2022   NA 142 03/03/2022   K 4.3 03/03/2022   CL 110 03/03/2022   CO2 26 03/03/2022    Lab Results  Component  Value Date   CHOL 117 07/27/2021   HDL 50 07/27/2021   LDLCALC 49 07/27/2021   TRIG 95 07/27/2021   CHOLHDL 2.3 07/27/2021   BP Readings from Last 3 Encounters:  04/28/22 98/68  02/16/22 120/78  11/09/21 100/70    Medications Reviewed Today     Reviewed by [name], RPH-CPP (Pharmacist) on 05/05/22 at [phone]  Med List Status: <None>   Medication Order Taking? Sig Documenting Provider Last Dose Status Informant  acetaminophen (TYLENOL) 500 MG tablet [id number]  Take 1,000 mg by mouth every 8 (eight) hours as needed for moderate pain or mild pain. [provider]  Active Self  Albuterol Sulfate 108 (90 Base) MCG/ACT AEPB [id number]  Inhale 2 puffs into the lungs every 6 (six) hours as needed (shortness of breath).  [provider]  Active Self  apixaban (ELIQUIS) 5 MG TABS tablet [id number]  Take 1 tablet by mouth twice daily Loel Dubonnet, NP  Active   ARTIFICIAL TEAR SOLUTION OP [id number]  Place 1 drop into both eyes daily. [provider]  Active Self  atorvastatin (LIPITOR) 40 MG tablet [id number]  Take 1 tablet by mouth once daily [name], MD  Active   Blood Glucose Monitoring Suppl (ONE TOUCH ULTRA 2) w/Device KIT [id number]  Use to check blood sugar as advised up to twice a day [name], DO  Active Self  calcium carbonate (TUMS - DOSED IN MG ELEMENTAL CALCIUM) 500 MG chewable tablet [id number]  Chew 1,000 mg by mouth daily as needed for indigestion or heartburn. [provider]  Active Self  cetirizine (ZYRTEC) 10 MG tablet [id number]  Take 10 mg by mouth daily. [provider]  Active Self  chlorpheniramine-HYDROcodone (TUSSIONEX) 10-8 MG/5ML [id number] Yes Take 5 mLs by mouth every 12 (twelve) hours as needed for cough. [name], DO Taking Active   cyanocobalamin (VITAMIN B12) 1000 MCG tablet [id number]  Take 1,000 mcg by mouth daily. [provider]  Active   FARXIGA 10 MG TABS  tablet [id number] Yes Take 1 tablet (10 mg total) by mouth daily before breakfast. [name], DO Taking Active Self  fluticasone (FLONASE) 50 MCG/ACT nasal spray [id number]  Place 2 sprays into both nostrils daily. [name], [name], DO  Active   furosemide (LASIX) 40 MG tablet [id number]  Take 0.5-1 tablets (20-40 mg total) by mouth daily as needed for edema. Only as needed now. [name], DO  Active Self  gabapentin (NEURONTIN) 100 MG capsule [id number]  TAKE 1 CAPSULE BY MOUTH THREE TIMES DAILY [name], [name], DO  Active   ipratropium-albuterol (DUONEB) 0.5-2.5 (3) MG/3ML SOLN [id number]  Take 3 mLs by nebulization every 6 (six) hours as needed (shortness of breath). [provider]  Active Self           Med Note Jilda Roche A   Fri Dec 24, 2020  4:04 PM)    Lanell Matar THIN MISC [id number]  1 Device by Does not apply route 4 (four) times daily -  before meals and  at bedtime. [name], MD  Active Self  levothyroxine (SYNTHROID) 88 MCG tablet [id number]  TAKE 1 TABLET BY MOUTH ONCE DAILY BEFORE BREAKFAST [name], Alexander Lenna Sciara, DO  Active   losartan (COZAAR) 25 MG tablet [id number]  Take 1 tablet (25 mg total) by mouth every morning. [name], MD  Active   metFORMIN (GLUCOPHAGE) 500 MG tablet [id number] Yes TAKE 2 TABLETS BY MOUTH TWICE DAILY WITH A MEAL [name], Alexander Lenna Sciara, DO Taking Active   metoprolol tartrate (LOPRESSOR) 50 MG tablet [id number]  TAKE 1 & 1/2 (ONE & ONE-HALF) TABLETS BY MOUTH TWICE DAILY [name], MD  Active   montelukast (SINGULAIR) 10 MG tablet [id number] Yes Take 10 mg by mouth at bedtime. [provider] Taking Active Self  Donald Siva test strip [id number]  USE TO CHECK BLOOD SUGAR TWICE DAILY [name], DO  Active   spironolactone (ALDACTONE) 25 MG tablet [id number]  Take 1/2 (one-half) tablet by mouth once daily [name], MD  Active   SYMBICORT 160-4.5  MCG/ACT inhaler [id number]  Inhale 2 puffs 2 (two) times daily into the lungs.  [provider]  Active Self              Assessment/Plan:   COPD/Seasonal Allergies: - Reviewed appropriate inhaler technique. - Patient to contact PCP office to let provider know if he decides would like to stay on Breztri inhaler, in place of Symbicort, so PCP can order [name] Rx to pharmacy - Will follow up with patient during next call regarding inhaler therapy and, if patient remaining on Breztri, will assist with obtaining through patient assistance program (note patient already enrolled in [employer] assistance program    Diabetes: - Have counseled on importance of continuing to control carbohydrate portion sizes and using blood sugar checks to reinforce dietary choices - Recommend to continue to check glucose, keep log of results and to bring this record to medical appointments    CHF/Hypertension: - Encourage patient to continue to weigh daily and and follow directions from Cardiologist for use of PRN furosemide (Lasix).  - Recommended to continue to check home blood pressure and heart rate, keep log of results and contact Cardiologist for BP and HR if needed for readings outside of established parameters or symptoms      Follow Up Plan: Clinical Pharmacist will follow up with patient by telephone on 06/12/2022 at 10:00 AM    Wallace Cullens, PharmD, [place], [facility] [place] [phone]

## 2022-05-05 NOTE — Patient Instructions (Signed)
Goals Addressed             This Visit's Progress    Pharmacy Goals       Our goal A1c is less than 7%. This corresponds with fasting sugars less than 130 and 2 hour after meal sugars less than 180. Please check your blood sugar and keep log of results  Please continue to monitor your home weight, blood pressure and heart rate  Our goal bad cholesterol, or LDL, is less than 70 . This is why it is important to continue taking your atorvastatin  Feel free to call me with any questions or concerns. I look forward to our next call!    Philip [name], PharmD, BCACP Clinical Pharmacist [facility] [place] [phone]         

## 2022-05-10 ENCOUNTER — Ambulatory Visit: Payer: Medicare Other | Admitting: Pharmacist

## 2022-05-10 ENCOUNTER — Telehealth: Payer: Self-pay | Admitting: Family Medicine

## 2022-05-10 DIAGNOSIS — J432 Centrilobular emphysema: Secondary | ICD-10-CM

## 2022-05-10 MED ORDER — BREZTRI AEROSPHERE 160-9-4.8 MCG/ACT IN AERO: 2.0000 | INHALATION_SPRAY | Freq: Two times a day (BID) | RESPIRATORY_TRACT | 11 refills | Status: DC

## 2022-05-10 NOTE — Progress Notes (Unsigned)
05/10/2022 Name: Philip WACHTER Sr. MRN: [id number] DOB: Jul 15, 1945  Chief Complaint  Patient presents with   Medication Assistance    Philip STUCKEY Sr. is a 77 y.o. year old adult who was referred to the pharmacist by their PCP for assistance in managing diabetes, hypertension, hyperlipidemia, and medication access.    Receive a voicemail from patient requesting a call back regarding medication assistance  Subjective:  Care Team: Primary Care Provider: [name], DO; Next Scheduled Visit: 08/10/2022 Cardiologist: [name], MD; Next Scheduled Visit: 08/18/2022 Pulmonologist: Erby Pian, MD; Next Scheduled Visit: 08/17/2022  Medication Access/Adherence  Current Pharmacy:  [facility] [address] ([address]), [place] [address] [place]) [place] [address] Phone: [phone] Fax: [phone]   Patient reports affordability concerns with their medications: Yes  Patient reports access/transportation concerns to their pharmacy: No  Patient reports adherence concerns with their medications:  No     COPD/seasonal allergies/Medication Assistance: Patient follows with Dr. Raul Del at [facility]   Current medications: Using Breztri 2 puffs twice daily for now from sample from PCP (previously on Symbicort 160/4.5 two puffs bid) Montelukast 10 mg QHS Albuterol inhaler as needed Cetirizine 10 mg daily Flonase nasal spray as needed   Reports trialing Breztri maintenance (Breztri) inhaler twice daily from sample as provided by PCP in place of Symbicort inhaler. Reports feels like he has noticed a significant improvement in his breathing on Breztri (versus Symbicort), while no longer having dry mouth since completed antibiotic course - Confirms consistently and rinsing mouth out after each use - Reports contacted PCP this morning to request provider send a Rx to his local pharmacy to switch him  from Symbicort to pharmacy  Current medication access support:  Today patient requesting assistance with patient assistance for [facility] inhaler    Health Maintenance  Health Maintenance Due  Topic Date Due   COVID-19 Vaccine (1) Never done   Fecal DNA (Cologuard)  Never done   Zoster Vaccines- Shingrix (1 of 2) Never done   DEXA SCAN  Never done   Diabetic kidney evaluation - Urine ACR  04/02/2019   INFLUENZA VACCINE  11/29/2021   HEMOGLOBIN A1C  01/27/2022   DTaP/Tdap/Td (2 - Td or Tdap) 05/01/2022     Objective: Lab Results  Component Value Date   HGBA1C 7.1 (H) 07/27/2021    Lab Results  Component Value Date   CREATININE 1.05 03/03/2022   BUN 17 03/03/2022   NA 142 03/03/2022   K 4.3 03/03/2022   CL 110 03/03/2022   CO2 26 03/03/2022    Lab Results  Component Value Date   CHOL 117 07/27/2021   HDL 50 07/27/2021   LDLCALC 49 07/27/2021   TRIG 95 07/27/2021   CHOLHDL 2.3 07/27/2021    Medications Reviewed Today     Reviewed by [name], RPH-CPP (Pharmacist) on 05/05/22 at [phone]  Med List Status: <None>   Medication Order Taking? Sig Documenting Provider Last Dose Status Informant  acetaminophen (TYLENOL) 500 MG tablet [id number]  Take 1,000 mg by mouth every 8 (eight) hours as needed for moderate pain or mild pain. [provider]  Active Self  Albuterol Sulfate 108 (90 Base) MCG/ACT AEPB [id number]  Inhale 2 puffs into the lungs every 6 (six) hours as needed (shortness of breath).  [provider]  Active Self  apixaban (ELIQUIS) 5 MG TABS tablet [id number]  Take 1 tablet by mouth twice daily  Philip Dubonnet, NP  Active   ARTIFICIAL TEAR SOLUTION OP [id number]  Place 1 drop into both eyes daily. [provider]  Active Self  atorvastatin (LIPITOR) 40 MG tablet [id number]  Take 1 tablet by mouth once daily [name], MD  Active   Blood Glucose Monitoring Suppl (ONE TOUCH ULTRA 2) w/Device KIT [id number]  Use to check  blood sugar as advised up to twice a day [name], DO  Active Self  calcium carbonate (TUMS - DOSED IN MG ELEMENTAL CALCIUM) 500 MG chewable tablet [id number]  Chew 1,000 mg by mouth daily as needed for indigestion or heartburn. [provider]  Active Self  cetirizine (ZYRTEC) 10 MG tablet [id number]  Take 10 mg by mouth daily. [provider]  Active Self  chlorpheniramine-HYDROcodone (TUSSIONEX) 10-8 MG/5ML [id number] Yes Take 5 mLs by mouth every 12 (twelve) hours as needed for cough. [name], DO Taking Active   cyanocobalamin (VITAMIN B12) 1000 MCG tablet [id number]  Take 1,000 mcg by mouth daily. [provider]  Active   FARXIGA 10 MG TABS tablet [id number] Yes Take 1 tablet (10 mg total) by mouth daily before breakfast. [name], DO Taking Active Self  fluticasone (FLONASE) 50 MCG/ACT nasal spray [id number]  Place 2 sprays into both nostrils daily. [name], [name], DO  Active   furosemide (LASIX) 40 MG tablet [id number]  Take 0.5-1 tablets (20-40 mg total) by mouth daily as needed for edema. Only as needed now. [name], DO  Active Self  gabapentin (NEURONTIN) 100 MG capsule [id number]  TAKE 1 CAPSULE BY MOUTH THREE TIMES DAILY [name], [name], DO  Active   ipratropium-albuterol (DUONEB) 0.5-2.5 (3) MG/3ML SOLN [id number]  Take 3 mLs by nebulization every 6 (six) hours as needed (shortness of breath). [provider]  Active Self           Med Note Jilda Roche A   Fri Dec 24, 2020  4:04 PM)    Lanell Matar THIN MISC [id number]  1 Device by Does not apply route 4 (four) times daily -  before meals and at bedtime. [name], MD  Active Self  levothyroxine (SYNTHROID) 88 MCG tablet [id number]  TAKE 1 TABLET BY MOUTH ONCE DAILY BEFORE BREAKFAST [name], Alexander Lenna Sciara, DO  Active   losartan (COZAAR) 25 MG tablet [id number]  Take 1 tablet (25 mg total) by mouth every morning. [name], MD  Active   metFORMIN (GLUCOPHAGE) 500 MG tablet [id number] Yes TAKE 2 TABLETS BY MOUTH TWICE DAILY WITH A MEAL [name], Alexander Lenna Sciara, DO Taking Active   metoprolol tartrate (LOPRESSOR) 50 MG tablet [id number]  TAKE 1 & 1/2 (ONE & ONE-HALF) TABLETS BY MOUTH TWICE DAILY [name], MD  Active   montelukast (SINGULAIR) 10 MG tablet [id number] Yes Take 10 mg by mouth at bedtime. [provider] Taking Active Self  Donald Siva test strip [id number]  USE TO CHECK BLOOD SUGAR TWICE DAILY [name], DO  Active   spironolactone (ALDACTONE) 25 MG tablet [id number]  Take 1/2 (one-half) tablet by mouth once daily [name], MD  Active   SYMBICORT 160-4.5 MCG/ACT inhaler [id number]  Inhale 2 puffs 2 (two) times daily into the lungs.  [provider]  Active Self              Assessment/Plan:   COPD/seasonal allergies/Medication Assistance: - Meets financial criteria for [facility] patient assistance program through [employer]Me.   Note  patient already enrolled in patient assistance from [employer] for [place] to [employer] assistance program on behalf of patient. Speak with representative Shanon Brow who advises patient will need to complete a new application for [facility] patient assistance, despite patient already being enrolled in program for [place] assistance Will collaborate with [place] and PCP to aid patient with application for [facility] patient assistance through [employer]&Me  Follow Up Plan: Clinical Pharmacist will follow up with patient by telephone on 06/12/2022 at 10:00 AM     Wallace Cullens, PharmD, Para [place], [place] [phone]

## 2022-05-10 NOTE — Telephone Encounter (Signed)
New rx Breztri sent to pharmacy. Discontinue Symbicort  [name], DO [facility] 05/10/2022, 10:42 AM

## 2022-05-10 NOTE — Telephone Encounter (Signed)
Medication Refill - Medication: [name]   Has the patient contacted their pharmacy? No. (Agent: If no, request that the patient contact the pharmacy for the refill. If patient does not wish to contact the pharmacy document the reason why and proceed with request.) (Agent: If yes, when and what did the pharmacy advise?)  Preferred Pharmacy (with phone number or street name):  [facility] [place] - [address] Phone: [phone]  Fax: [phone]     Has the patient been seen for an appointment in the last year OR does the patient have an upcoming appointment? Yes.    Agent: Please be advised that RX refills may take up to 3 business days. We ask that you follow-up with your pharmacy.  *Patient was given sample of Breztri to try at visit on 12.29.23. Patient states medication is working much better than the Symbicort.

## 2022-05-11 NOTE — Patient Instructions (Signed)
Goals Addressed             This Visit's Progress    Pharmacy Goals       Our goal A1c is less than 7%. This corresponds with fasting sugars less than 130 and 2 hour after meal sugars less than 180. Please check your blood sugar and keep log of results  Please continue to monitor your home weight, blood pressure and heart rate  Our goal bad cholesterol, or LDL, is less than 70 . This is why it is important to continue taking your atorvastatin  Feel free to call me with any questions or concerns. I look forward to our next call!    Tara Wich Janaiah Vetrano, PharmD, BCACP Clinical Pharmacist [facility] [place] [phone]         

## 2022-05-12 ENCOUNTER — Telehealth: Payer: Self-pay | Admitting: *Deleted

## 2022-05-12 NOTE — Patient Outreach (Signed)
  Care Coordination   05/12/2022 Name: Philip [name]. MRN: [id number] DOB: [birth year]   Care Coordination Outreach Attempts:  An unsuccessful telephone outreach was attempted today to offer the patient information about available care coordination services as a benefit of their health plan.   Follow Up Plan:  Additional outreach attempts will be made to offer the patient care coordination information and services.   Encounter Outcome:  No Answer   Care Coordination Interventions:  No, not indicated    Valente David, RN, MSN, [facility] Care Management Care Management Coordinator [phone]

## 2022-05-15 ENCOUNTER — Telehealth: Payer: Self-pay | Admitting: *Deleted

## 2022-05-15 ENCOUNTER — Telehealth: Payer: Self-pay | Admitting: Pharmacy Technician

## 2022-05-15 ENCOUNTER — Encounter: Payer: Self-pay | Admitting: *Deleted

## 2022-05-15 DIAGNOSIS — Z596 Low income: Secondary | ICD-10-CM

## 2022-05-15 NOTE — Progress Notes (Signed)
[place] [facility])                                            [place] Team    05/15/2022  HARVIE MORUA Sr. June 29, 1945 [id number]                                      Medication Assistance Referral  Referral From: [facility] Embedded RPh Dorthula Perfect   Medication/Company: [name] / [place] Patient application portion:  N/A already have on file Provider application portion: Faxed  to Dr. [name] Provider address/fax verified via: Office website  Reiley Keisler P. Iolanda Folson, [place]  [phone]

## 2022-05-15 NOTE — Patient Instructions (Signed)
Visit Information  Thank you for taking time to visit with me today. Please don't hesitate to contact me if I can be of assistance to you.  Following are the goals we discussed today:  Continue monitoring of weight, blood pressure, and blood sugar daily.  Please call the care guide team at [phone] if you need to cancel or reschedule your appointment.   Please call the Suicide and Crisis Lifeline: 988 call the [place] National Suicide Prevention Lifeline: (856) 139-6175 or TTY: 681-715-8464 TTY (989)327-2164) to talk to a trained counselor call 1-800-273-TALK (toll free, 24 hour hotline) call 911 if you are experiencing a Mental Health or [place] or need someone to talk to.  Patient verbalizes understanding of instructions and care plan provided today and agrees to view in [place]. Active MyChart status and patient understanding of how to access instructions and care plan via MyChart confirmed with patient.     The patient has been provided with contact information for the care management team and has been advised to call with any health related questions or concerns.   Valente David, RN, MSN, [place] Care Management Care Management Coordinator [phone]

## 2022-05-15 NOTE — Patient Outreach (Signed)
  Care Coordination   Initial Visit Note   05/15/2022 Name: Philip [name] Sr. MRN: [id number] DOB: 1946-03-05  [name] is a 77 y.o. year old adult who sees [name], DO for primary care. I spoke with  [name] by phone today.  What matters to the patients health and wellness today?  Report chronic conditions are managed, working with pharmacy team for [employer].  Does not feel follow up is needed at this time, will call with questions.     Goals Addressed             This Visit's Progress    COMPLETED: Care Coordination Activities - no follow up needed       Care Coordination Interventions: Provided patient with basic written and verbal COPD education on self care/management/and exacerbation prevention Advised patient to track and manage COPD triggers Provided instruction about proper use of medications used for management of COPD including inhalers Advised patient to self assesses COPD action plan zone and make appointment with provider if in the yellow zone for 48 hours without improvement Screening for signs and symptoms of depression related to chronic disease state  Assessed social determinant of health barriers Discussed cost of [name], already active with pharmacy team, waiting for call back about medication assistance Upcoming appointments with pharmacy team on 2/12, PCP on 4/11, and cardiology on 4/19         SDOH assessments and interventions completed:  Yes  SDOH Interventions Today    Flowsheet Row Most Recent Value  SDOH Interventions   Food Insecurity Interventions Intervention Not Indicated  Housing Interventions Intervention Not Indicated  Transportation Interventions Intervention Not Indicated        Care Coordination Interventions:  Yes, provided   Follow up plan: No further intervention required.   Encounter Outcome:  Pt. Visit Completed   Valente David, RN, MSN, [place] Care Management Care Management  Coordinator [phone]

## 2022-05-17 ENCOUNTER — Other Ambulatory Visit: Payer: Self-pay | Admitting: Family Medicine

## 2022-05-17 DIAGNOSIS — E1142 Type 2 diabetes mellitus with diabetic polyneuropathy: Secondary | ICD-10-CM

## 2022-05-17 NOTE — Telephone Encounter (Signed)
Requested Prescriptions  Pending Prescriptions Disp Refills   gabapentin (NEURONTIN) 100 MG capsule [Pharmacy Med Name: Gabapentin 100 MG Oral Capsule] 270 capsule 1    Sig: TAKE 1 CAPSULE BY MOUTH THREE TIMES DAILY     Neurology: Anticonvulsants - gabapentin Passed - 05/17/2022 11:38 AM      Passed - Cr in normal range and within 360 days    Creat  Date Value Ref Range Status  07/27/2021 1.13 0.70 - 1.28 mg/dL Final   Creatinine, Ser  Date Value Ref Range Status  03/03/2022 1.05 0.61 - 1.24 mg/dL Final         Passed - Completed PHQ-2 or PHQ-9 in the last 360 days      Passed - Valid encounter within last 12 months    Recent Outpatient Visits           1 week ago Centrilobular emphysema ([place])   [place], Grayland Ormond A, RPH-CPP   1 week ago Type 2 diabetes mellitus with peripheral neuropathy ([place])   [facility] [place], Grayland Ormond A, RPH-CPP   2 weeks ago Acute bronchitis with COPD [facility])   [facility] [name], DO   2 months ago Type 2 diabetes mellitus with peripheral neuropathy [facility])   [place], Grayland Ormond A, RPH-CPP   2 months ago Type 2 diabetes mellitus with peripheral neuropathy ([place])   [place], Virl Diamond, RPH-CPP       Future Appointments             In 2 months [name], [name], DO [facility], [place]   In 3 months Fletcher Anon, Mertie Clause, MD [place]. [place]

## 2022-05-24 ENCOUNTER — Other Ambulatory Visit: Payer: Self-pay | Admitting: Family Medicine

## 2022-05-24 DIAGNOSIS — E034 Atrophy of thyroid (acquired): Secondary | ICD-10-CM

## 2022-05-24 NOTE — Telephone Encounter (Signed)
Requested Prescriptions  Pending Prescriptions Disp Refills   levothyroxine (SYNTHROID) 88 MCG tablet [Pharmacy Med Name: Levothyroxine Sodium 88 MCG Oral Tablet] 90 tablet 0    Sig: TAKE 1 TABLET BY MOUTH ONCE DAILY BEFORE BREAKFAST     Endocrinology:  Hypothyroid Agents Failed - 05/24/2022 12:46 PM      Failed - TSH in normal range and within 360 days    TSH  Date Value Ref Range Status  07/27/2021 5.82 (H) 0.40 - 4.50 mIU/L Final         Passed - Valid encounter within last 12 months    Recent Outpatient Visits           2 weeks ago Centrilobular emphysema ([place])   [facility] [place], Grayland Ormond A, RPH-CPP   2 weeks ago Type 2 diabetes mellitus with peripheral neuropathy [facility])   [place], Grayland Ormond A, RPH-CPP   3 weeks ago Acute bronchitis with COPD [facility])   [place], DO   2 months ago Type 2 diabetes mellitus with peripheral neuropathy [facility])   [place], Grayland Ormond A, RPH-CPP   2 months ago Type 2 diabetes mellitus with peripheral neuropathy [facility])   [place], Virl Diamond, RPH-CPP       Future Appointments             In 2 months [name], [name], DO [facility], [place]   In 2 months Fletcher Anon, Mertie Clause, MD [place] [facility]

## 2022-05-25 ENCOUNTER — Telehealth: Payer: Self-pay | Admitting: Pharmacy Technician

## 2022-05-25 DIAGNOSIS — Z596 Low income: Secondary | ICD-10-CM

## 2022-05-25 NOTE — Progress Notes (Signed)
[place] [facility])                                            [place] Team    05/25/2022  [place] [birth year] [id number]  Received both patient and provider portion(s) of patient assistance application(s) for [facility]. Faxed completed application and required documents into [place].    Kellan Boehlke P. Rosbel Buckner, [place]  [phone]

## 2022-05-31 DIAGNOSIS — H43813 Vitreous degeneration, bilateral: Secondary | ICD-10-CM | POA: Diagnosis not present

## 2022-05-31 DIAGNOSIS — E119 Type 2 diabetes mellitus without complications: Secondary | ICD-10-CM | POA: Diagnosis not present

## 2022-05-31 DIAGNOSIS — Z961 Presence of intraocular lens: Secondary | ICD-10-CM | POA: Diagnosis not present

## 2022-05-31 DIAGNOSIS — M3501 Sicca syndrome with keratoconjunctivitis: Secondary | ICD-10-CM | POA: Diagnosis not present

## 2022-05-31 LAB — HM DIABETES EYE EXAM

## 2022-06-07 ENCOUNTER — Telehealth: Payer: Self-pay | Admitting: Pharmacy Technician

## 2022-06-07 DIAGNOSIS — Z596 Low income: Secondary | ICD-10-CM

## 2022-06-07 NOTE — Progress Notes (Signed)
[place] [facility])                                            [place] Team    06/07/2022  ALLEN EGERTON Sr. June 11, 1945 [id number]  Care coordination call placed to [employer] in regard to [facility] application.  Spoke to [place] who informs patient is APPROVED 2/224-05/01/23. Medication will auto process, auto fill and deliver to the patient's home based on last fill date in 2023. Patient may call [employer] at [phone] if he feels he does not have sufficient supply.  Mechille Varghese P. Edvin Albus, [place]  [phone]

## 2022-06-12 ENCOUNTER — Ambulatory Visit: Payer: Medicare Other | Admitting: Pharmacist

## 2022-06-12 DIAGNOSIS — I129 Hypertensive chronic kidney disease with stage 1 through stage 4 chronic kidney disease, or unspecified chronic kidney disease: Secondary | ICD-10-CM

## 2022-06-12 DIAGNOSIS — J432 Centrilobular emphysema: Secondary | ICD-10-CM

## 2022-06-12 DIAGNOSIS — E1169 Type 2 diabetes mellitus with other specified complication: Secondary | ICD-10-CM

## 2022-06-12 DIAGNOSIS — E1142 Type 2 diabetes mellitus with diabetic polyneuropathy: Secondary | ICD-10-CM

## 2022-06-12 NOTE — Progress Notes (Signed)
06/12/2022 Name: CLARE BREYER Sr. MRN: [id number] DOB: [birth year]  Chief Complaint  Patient presents with   Medication Management   Medication Assistance    KAJ TODOROFF Sr. is a 77 y.o. year old adult who presented for a telephone visit.   They were referred to the pharmacist by their PCP for assistance in managing diabetes, hypertension, hyperlipidemia, and medication access.    Subjective:  Care Team: Primary Care Provider: [name], DO; Next Scheduled Visit: 08/10/2022 Cardiologist: [name], MD; Next Scheduled Visit: 08/18/2022 Pulmonologist: Erby Pian, MD; Next Scheduled Visit: 08/17/2022  Medication Access/Adherence  Current Pharmacy:  [facility] [address]), [place] [address] [place]) [place] [address] Phone: [phone] Fax: [phone]   Patient reports affordability concerns with their medications: No  Patient reports access/transportation concerns to their pharmacy: No  Patient reports adherence concerns with their medications:  No     Type 2 Diabetes:   Current medications: metformin 500 mg - 2 tablets (1,000 mg) twice daily as directed Farxiga 10 mg - 1 tablet QAM     Current glucose readings:   Fasting Blood Glucose  After Supper/Bedtime  6 - February 153 200  7 - February 161 179  8 - February 131 190  9 - February 132 205  10 - February 131 157  11 - February 125 208  12 - February 148   *Attributes elevated readings to having sweets around the house and drinking more sweet tea recently  Denies symptoms of hypoglycemia   Current medication access support: enrolled in patient assistance for Farxiga from [employer] through 05/01/2023    CHF/Hypertension:   Current medications:  Losartan 25 mg - 1 tablet daily Metoprolol 75 mg twice daily Spironolactone 25 mg - 1/2 tablet (12.5 mg) daily  Farxiga 10 mg - 1 tablets daily Furosemide 40 mg daily as  needed for edema as directed by Cardiologist   Patient has an automated, upper arm home BP cuff Current blood pressure readings readings:  - Today: 123/81, HR 66  Denies symptoms of hypotension such as lightheadedness or dizziness  Confirms monitoring home weight daily as directed by Cardiology   COPD/seasonal allergies: Patient follows with [facility] Raul Del at [facility]monology   Current medications: Breztri 2 puffs twice daily Montelukast 10 mg QHS Albuterol inhaler as needed Cetirizine 10 mg daily Flonase nasal spray as needed  Today reports breathing continues to be significantly improved since started using Breztri inhaler.    Confirms using consistently as directed and rinsing mouth out after each use  Current medication access support: Collaborated with [facility] CPhT and PCP to aid patient with application for [facility] patient assistance through [employer]  - From review of chart, note patient approved for enrollment in assistance program through 05/01/2023 and medication to ship to patient's home Patient reports currently has ~2 weeks of his current inhaler remaining, but will follow up with [employer] on shipping status if not received from program by end of week  Objective:  Lab Results  Component Value Date   HGBA1C 7.1 (H) 07/27/2021    Lab Results  Component Value Date   CREATININE 1.05 03/03/2022   BUN 17 03/03/2022   NA 142 03/03/2022   K 4.3 03/03/2022   CL 110 03/03/2022   CO2 26 03/03/2022    Lab Results  Component Value Date   CHOL 117 07/27/2021   HDL 50 07/27/2021   LDLCALC 49 07/27/2021  TRIG 95 07/27/2021   CHOLHDL 2.3 07/27/2021   BP Readings from Last 3 Encounters:  04/28/22 98/68  02/16/22 120/78  11/09/21 100/70   Pulse Readings from Last 3 Encounters:  04/28/22 (!) 110  02/16/22 70  11/09/21 70     Medications Reviewed Today     Reviewed by [name], RPH-CPP (Pharmacist) on 06/12/22 at 1032  Med List Status: <None>    Medication Order Taking? Sig Documenting Provider Last Dose Status Informant  acetaminophen (TYLENOL) 500 MG tablet [id number]  Take 1,000 mg by mouth every 8 (eight) hours as needed for moderate pain or mild pain. [provider]  Active Self  Albuterol Sulfate 108 (90 Base) MCG/ACT AEPB [id number]  Inhale 2 puffs into the lungs every 6 (six) hours as needed (shortness of breath).  [provider]  Active Self  apixaban (ELIQUIS) 5 MG TABS tablet [id number] Yes Take 1 tablet by mouth twice daily Loel Dubonnet, NP Taking Active   ARTIFICIAL TEAR SOLUTION OP [id number]  Place 1 drop into both eyes daily. [provider]  Active Self  atorvastatin (LIPITOR) 40 MG tablet [id number] Yes Take 1 tablet by mouth once daily [name], MD Taking Active   Blood Glucose Monitoring Suppl (ONE TOUCH ULTRA 2) w/Device KIT [id number]  Use to check blood sugar as advised up to twice a day [name], DO  Active Self  BREZTRI AEROSPHERE 160-9-4.8 MCG/ACT Hollie Salk [id number] Yes Inhale 2 puffs into the lungs 2 (two) times daily. [name], DO Taking Active   calcium carbonate (TUMS - DOSED IN MG ELEMENTAL CALCIUM) 500 MG chewable tablet [id number]  Chew 1,000 mg by mouth daily as needed for indigestion or heartburn. [provider]  Active Self  cetirizine (ZYRTEC) 10 MG tablet [id number] Yes Take 10 mg by mouth daily. [provider] Taking Active Self  cyanocobalamin (VITAMIN B12) 1000 MCG tablet [id number]  Take 1,000 mcg by mouth daily. [provider]  Active   FARXIGA 10 MG TABS tablet [id number] Yes Take 1 tablet (10 mg total) by mouth daily before breakfast. [name], DO Taking Active Self  fluticasone (FLONASE) 50 MCG/ACT nasal spray [id number]  Place 2 sprays into both nostrils daily. [name], [name], DO  Active   furosemide (LASIX) 40 MG tablet [id number] Yes Take 0.5-1 tablets (20-40 mg total) by mouth  daily as needed for edema. Only as needed now. [name], DO Taking Active Self  gabapentin (NEURONTIN) 100 MG capsule [id number] Yes TAKE 1 CAPSULE BY MOUTH THREE TIMES DAILY [name], [name], DO Taking Active   ipratropium-albuterol (DUONEB) 0.5-2.5 (3) MG/3ML SOLN [id number]  Take 3 mLs by nebulization every 6 (six) hours as needed (shortness of breath). [provider]  Active Self           Med Note Jilda Roche A   Fri Dec 24, 2020  4:04 PM)    Lanell Matar THIN MISC [id number]  1 Device by Does not apply route 4 (four) times daily -  before meals and at bedtime. [name], MD  Active Self  levothyroxine (SYNTHROID) 88 MCG tablet [id number] Yes TAKE 1 TABLET BY MOUTH ONCE DAILY BEFORE BREAKFAST [name], [name], DO Taking Active   losartan (COZAAR) 25 MG tablet [id number] Yes Take 1 tablet (25 mg total) by mouth every morning. [name], MD Taking Active   metFORMIN (GLUCOPHAGE) 500 MG tablet [id number] Yes TAKE 2 TABLETS BY MOUTH TWICE DAILY WITH  A MEAL [name], [name], DO Taking Active   metoprolol tartrate (LOPRESSOR) 50 MG tablet [id number] Yes TAKE 1 & 1/2 (ONE & ONE-HALF) TABLETS BY MOUTH TWICE DAILY [name], MD Taking Active   montelukast (SINGULAIR) 10 MG tablet [id number] Yes Take 10 mg by mouth at bedtime. [provider] Taking Active Self  Donald Siva test strip [id number]  USE TO CHECK BLOOD SUGAR TWICE DAILY [name], [name], DO  Active   spironolactone (ALDACTONE) 25 MG tablet [id number] Yes Take 1/2 (one-half) tablet by mouth once daily [name], MD Taking Active               Assessment/Plan:   COPD/Seasonal Allergies: - Reviewed appropriate inhaler technique - Patient to follow up with [place] assistance program as needed regarding refills of Breztri inhaler  Diabetes: - Counseled on importance of continuing to control carbohydrate portion sizes and using blood sugar  checks to reinforce dietary choices - Encourage patient to cut back on sweets and sugary beverages (sweet tea, Pepsi) - Recommend to continue to check glucose, keep log of results and to bring this record to medical appointments     CHF/Hypertension: - Encourage patient to continue to weigh daily and and follow directions from Cardiologist for use of PRN furosemide (Lasix).  - Recommended to continue to check home blood pressure and heart rate, keep log of results and contact Cardiologist for BP and HR if needed for readings outside of established parameters or symptoms      Follow Up Plan: Clinical Pharmacist will follow up with patient by telephone on 07/10/2022 at 9:15 am   Wallace Cullens, PharmD, [place], [facility] [phone]

## 2022-06-12 NOTE — Patient Instructions (Signed)
Goals Addressed             This Visit's Progress    Pharmacy Goals       Our goal A1c is less than 7%. This corresponds with fasting sugars less than 130 and 2 hour after meal sugars less than 180. Please check your blood sugar and keep log of results  Please continue to monitor your home weight, blood pressure and heart rate  Our goal bad cholesterol, or LDL, is less than 70 . This is why it is important to continue taking your atorvastatin  Feel free to call me with any questions or concerns. I look forward to our next call!    Wallace Cullens, PharmD, [place] [phone]

## 2022-07-10 ENCOUNTER — Other Ambulatory Visit: Payer: Self-pay | Admitting: Cardiovascular Disease

## 2022-07-10 ENCOUNTER — Ambulatory Visit: Payer: Medicare Other | Admitting: Pharmacist

## 2022-07-10 DIAGNOSIS — E1142 Type 2 diabetes mellitus with diabetic polyneuropathy: Secondary | ICD-10-CM

## 2022-07-10 DIAGNOSIS — I129 Hypertensive chronic kidney disease with stage 1 through stage 4 chronic kidney disease, or unspecified chronic kidney disease: Secondary | ICD-10-CM

## 2022-07-10 DIAGNOSIS — J432 Centrilobular emphysema: Secondary | ICD-10-CM

## 2022-07-10 MED ORDER — APIXABAN 5 MG PO TABS: 5.0000 mg | ORAL_TABLET | Freq: Two times a day (BID) | ORAL | 5 refills | Status: DC

## 2022-07-10 NOTE — Progress Notes (Signed)
07/10/2022 Name: Philip ALYEA Sr. MRN: [id number] DOB: 26-Jan-1946  Chief Complaint  Patient presents with   Medication Management   Medication Assistance    Philip SODERBERG Sr. is a 77 y.o. year old adult who presented for a telephone visit.   They were referred to the pharmacist by their PCP for assistance in managing diabetes, hypertension, hyperlipidemia, and medication access.    Subjective:  Care Team: Primary Care Provider: [name], DO; Next Scheduled Visit: 08/07/2022 Cardiologist: [name], MD; Next Scheduled Visit: 08/18/2022 Pulmonologist: Erby Pian, MD; Next Scheduled Visit: 08/17/2022  Medication Access/Adherence  Current Pharmacy:  [facility] [address]), [place] [address] [place] [address] Phone: [phone] Fax: [phone]   Patient reports affordability concerns with their medications: No  Patient reports access/transportation concerns to their pharmacy: No  Patient reports adherence concerns with their medications:  No    Identify patient currently out of Eliquis. Reports current Rx out of refills     Type 2 Diabetes:   Current medications: metformin 500 mg - 2 tablets (1,000 mg) twice daily as directed Farxiga 10 mg - 1 tablet QAM     Current glucose readings:  Fasting Blood Glucose After Supper/Bedtime  5 - March 143 220  6 - March 160 274  7 - March 133 257  8 - March 158 176  9 - March 130 194  10 - March 144 165  11 - March 132    Reports has cut back on soda, but still drinking sweet tea some. Attributes elevated readings to drinking sweet tea and snacking on cookies or chips while watching tv   Denies symptoms of hypoglycemia  Reports completed last annual eye exam: February 2024   Current medication access support: enrolled in patient assistance for Farxiga from [employer] through 05/01/2023    CHF/Hypertension:   Current  medications:  Losartan 25 mg - 1 tablet daily Metoprolol 75 mg twice daily Spironolactone 25 mg - 1/2 tablet (12.5 mg) daily  Farxiga 10 mg - 1 tablets daily Furosemide 40 mg daily as needed for edema as directed by Cardiologist   Patient has an automated, upper arm home BP cuff Current blood pressure readings readings:  - Today: 137/87, HR 68 - Yesterday: AM: 117/82, HR 66; PM: 115/74, HR 79   Denies symptoms of hypotension such as lightheadedness or dizziness   Confirms monitoring home weight daily as directed by Cardiology     COPD/seasonal allergies: Patient follows with Dr. Raul Del at [facility]   Current medications: Breztri 2 puffs twice daily Montelukast 10 mg QHS Albuterol inhaler as needed Cetirizine 10 mg daily Flonase nasal spray as needed   Today reports breathing continues to be significantly improved since started using Breztri inhaler.    Confirms using consistently as directed and rinsing mouth out after each use   Current medication access support: Enrolled in [facility] patient assistance program through [employer] through 05/01/2023 - Reports received shipment of 3 inhalers from program in February   Objective:  Lab Results  Component Value Date   HGBA1C 7.1 (H) 07/27/2021    Lab Results  Component Value Date   CREATININE 1.05 03/03/2022   BUN 17 03/03/2022   NA 142 03/03/2022   K 4.3 03/03/2022   CL 110 03/03/2022   CO2 26 03/03/2022    Lab Results  Component Value Date   CHOL 117 07/27/2021   HDL 50  07/27/2021   LDLCALC 49 07/27/2021   TRIG 95 07/27/2021   CHOLHDL 2.3 07/27/2021   BP Readings from Last 3 Encounters:  04/28/22 98/68  02/16/22 120/78  11/09/21 100/70   Pulse Readings from Last 3 Encounters:  04/28/22 (!) 110  02/16/22 70  11/09/21 70     Medications Reviewed Today     Reviewed by [name], RPH-CPP (Pharmacist) on 07/10/22 at [facility] Status: <None>   Medication Order Taking? Sig  Documenting Provider Last Dose Status Informant  acetaminophen (TYLENOL) 500 MG tablet [id number]  Take 1,000 mg by mouth every 8 (eight) hours as needed for moderate pain or mild pain. [provider]  Active Self  Albuterol Sulfate 108 (90 Base) MCG/ACT AEPB [id number]  Inhale 2 puffs into the lungs every 6 (six) hours as needed (shortness of breath).  [provider]  Active Self  apixaban (ELIQUIS) 5 MG TABS tablet [id number]  Take 1 tablet (5 mg total) by mouth 2 (two) times daily. [name], MD  Active   ARTIFICIAL TEAR SOLUTION OP [id number]  Place 1 drop into both eyes daily. [provider]  Active Self  atorvastatin (LIPITOR) 40 MG tablet [id number]  Take 1 tablet by mouth once daily [name], MD  Active   Blood Glucose Monitoring Suppl (ONE TOUCH ULTRA 2) w/Device KIT [id number]  Use to check blood sugar as advised up to twice a day [name], DO  Active Self  BREZTRI AEROSPHERE 160-9-4.8 MCG/ACT Hollie Salk [id number] Yes Inhale 2 puffs into the lungs 2 (two) times daily. [name], DO Taking Active   calcium carbonate (TUMS - DOSED IN MG ELEMENTAL CALCIUM) 500 MG chewable tablet [id number]  Chew 1,000 mg by mouth daily as needed for indigestion or heartburn. [provider]  Active Self  cetirizine (ZYRTEC) 10 MG tablet [id number]  Take 10 mg by mouth daily. [provider]  Active Self  cyanocobalamin (VITAMIN B12) 1000 MCG tablet [id number]  Take 1,000 mcg by mouth daily. [provider]  Active   FARXIGA 10 MG TABS tablet [id number] Yes Take 1 tablet (10 mg total) by mouth daily before breakfast. [name], DO Taking Active Self  fluticasone (FLONASE) 50 MCG/ACT nasal spray [id number]  Place 2 sprays into both nostrils daily. [name], [name], DO  Active   furosemide (LASIX) 40 MG tablet [id number] Yes Take 0.5-1 tablets (20-40 mg total) by mouth daily as needed for edema. Only as  needed now. [name], DO Taking Active Self  gabapentin (NEURONTIN) 100 MG capsule [id number]  TAKE 1 CAPSULE BY MOUTH THREE TIMES DAILY [name], [name], DO  Active   ipratropium-albuterol (DUONEB) 0.5-2.5 (3) MG/3ML SOLN [id number]  Take 3 mLs by nebulization every 6 (six) hours as needed (shortness of breath). [provider]  Active Self           Med Note Jilda Roche A   Fri Dec 24, 2020  4:04 PM)    Lanell Matar THIN MISC [id number]  1 Device by Does not apply route 4 (four) times daily -  before meals and at bedtime. [name], MD  Active Self  levothyroxine (SYNTHROID) 88 MCG tablet [id number]  TAKE 1 TABLET BY MOUTH ONCE DAILY BEFORE BREAKFAST [name], Alexander Lenna Sciara, DO  Active   losartan (COZAAR) 25 MG tablet [id number] Yes Take 1 tablet (25 mg total) by mouth every morning. [name], MD Taking Active   metFORMIN (GLUCOPHAGE)  500 MG tablet [id number] Yes TAKE 2 TABLETS BY MOUTH TWICE DAILY WITH A MEAL [name], Alexander Lenna Sciara, DO Taking Active   metoprolol tartrate (LOPRESSOR) 50 MG tablet [id number] Yes TAKE 1 & 1/2 (ONE & ONE-HALF) TABLETS BY MOUTH TWICE DAILY [name], MD Taking Active   montelukast (SINGULAIR) 10 MG tablet [id number]  Take 10 mg by mouth at bedtime. [provider]  Active Self  Donald Siva test strip [id number]  USE TO CHECK BLOOD SUGAR TWICE DAILY [name], [name], DO  Active   spironolactone (ALDACTONE) 25 MG tablet [id number] Yes Take 1/2 (one-half) tablet by mouth once daily [name], MD Taking Active               Assessment/Plan:   Counsel patient on importance of contacting pharmacy for refills, asking pharmacy to fax provider when out of refills and contacting provider office if needed  Today advise patient to contact Cardiology office to request renewal of his Eliquis as reports currently out of Eliquis and Rx out of refills  Review chart in afternoon to confirm  renewal sent  COPD/Seasonal Allergies: - Reviewed appropriate inhaler technique - Patient to follow up with [employer] assistance program as needed regarding refills of Breztri inhaler   Diabetes: - Counseled on importance of continuing to control carbohydrate portion sizes and using blood sugar checks to reinforce dietary choices - Encourage patient to cut back on sweets and sugary beverages (sweet tea, Pepsi) - Discuss ideas for lower carbohydrate snack options - Recommend to continue to check glucose, keep log of results and to bring this record to medical appointments     CHF/Hypertension: - Encourage patient to continue to weigh daily and and follow directions from Cardiologist for use of PRN furosemide (Lasix).  - Recommended to continue to check home blood pressure and heart rate, keep log of results and contact Cardiologist for BP and HR if needed for readings outside of established parameters or symptoms      Follow Up Plan: Clinical Pharmacist will follow up with patient by telephone on 09/18/2022 at [place], PharmD, [facility] [place], [place] [phone]

## 2022-07-10 NOTE — Telephone Encounter (Signed)
Please review

## 2022-07-10 NOTE — Telephone Encounter (Signed)
*  STAT* If patient is at the pharmacy, call can be transferred to refill team.   1. Which medications need to be refilled? (please list name of each medication and dose if known) Eliquis 5 mg twice a day  2. Which pharmacy/location (including street and city if local pharmacy) is medication to be sent to?[place]  3. Do they need a 30 day or 90 day supply? 30  Patient is completely out

## 2022-07-10 NOTE — Telephone Encounter (Signed)
Prescription refill request for Eliquis received.  Indication: afib  Last office visit: arida, 02/16/2022 Scr: 1.05, 03/03/2022 Age: 77 yo  Weight: 83.6 kg   Refill sent.

## 2022-07-10 NOTE — Patient Instructions (Signed)
Goals Addressed             This Visit's Progress    Pharmacy Goals       Our goal A1c is less than 7%. This corresponds with fasting sugars less than 130 and 2 hour after meal sugars less than 180. Please check your blood sugar and keep log of results  Please continue to monitor your home weight, blood pressure and heart rate  Our goal bad cholesterol, or LDL, is less than 70 . This is why it is important to continue taking your atorvastatin  Feel free to call me with any questions or concerns. I look forward to our next call!    Calie Buttrey Sayed Apostol, PharmD, BCACP Clinical Pharmacist [facility] [place] [phone]         

## 2022-07-14 ENCOUNTER — Ambulatory Visit: Payer: Self-pay | Admitting: *Deleted

## 2022-07-14 ENCOUNTER — Ambulatory Visit (INDEPENDENT_AMBULATORY_CARE_PROVIDER_SITE_OTHER): Payer: Medicare Other | Admitting: Physician Assistant

## 2022-07-14 ENCOUNTER — Encounter: Payer: Self-pay | Admitting: Physician Assistant

## 2022-07-14 VITALS — BP 138/86 | HR 82 | Temp 96.9°F | Wt 192.0 lb

## 2022-07-14 DIAGNOSIS — M545 Low back pain, unspecified: Secondary | ICD-10-CM

## 2022-07-14 MED ORDER — PREDNISONE 20 MG PO TABS: ORAL_TABLET | ORAL | 0 refills | Status: DC

## 2022-07-14 MED ORDER — METHOCARBAMOL 750 MG PO TABS: 750.0000 mg | ORAL_TABLET | Freq: Three times a day (TID) | ORAL | 0 refills | Status: DC | PRN

## 2022-07-14 NOTE — Telephone Encounter (Signed)
  Chief Complaint: Severe left lower back pain with radiation down left leg to his foot. Symptoms: Was sitting on couch and when he leaned forward to get up he felt a sudden sharp pain in his lower back and down his left leg to his foot.    He had lower back surgery 2 yrs ago.   Been fine until now. Frequency: Since day before yesterday Pertinent Negatives: Patient denies N/A Disposition: [] ED /[] Urgent Care (no appt availability in office) / [x] Appointment(In office/virtual)/ []  [facility]/ [] Home Care/ [] Refused Recommended Disposition /[] [facility]/ []  Follow-up with PCP Additional Notes: Appt made for today with Erin Mecum, [name] at 2:40.     Pt agreeable to appt.

## 2022-07-14 NOTE — Telephone Encounter (Signed)
Reason for Disposition  [1] SEVERE back pain (e.g., excruciating, unable to do any normal activities) AND [2] not improved 2 hours after pain medicine  Answer Assessment - Initial Assessment Questions 1. ONSET: "When did the pain begin?"      Day before yesterday my back started hurting.      I was sitting on the couch when I leaned forward to get up I got a sharp pain in my lower back and down my left leg that has not gotten better.    I'm in a lot of pain.   2. LOCATION: "Where does it hurt?" (upper, mid or lower back)     Left lower back mainly and sharp pain down left leg to my foot. I had surgery on my back for a piece of bone that was touching on the nerve 2 yrs ago.   I've been fine until now.  3. SEVERITY: "How bad is the pain?"  (e.g., Scale 1-10; mild, moderate, or severe)   - MILD (1-3): Doesn't interfere with normal activities.    - MODERATE (4-7): Interferes with normal activities or awakens from sleep.    - SEVERE (8-10): Excruciating pain, unable to do any normal activities.      Severe with movement  Also very hard to sleep due to the pain.  L Movement causes severe pain. 4. PATTERN: "Is the pain constant?" (e.g., yes, no; constant, intermittent)      I'm using the heating pad and taking Tylenol and taking hot showers which help a little but not much. 5. RADIATION: "Does the pain shoot into your legs or somewhere else?"     Down left leg to my foot 6. CAUSE:  "What do you think is causing the back pain?"      I don't know 7. BACK OVERUSE:  "Any recent lifting of heavy objects, strenuous work or exercise?"     No 8. MEDICINES: "What have you taken so far for the pain?" (e.g., nothing, acetaminophen, NSAIDS)     Tylenol 9. NEUROLOGIC SYMPTOMS: "Do you have any weakness, numbness, or problems with bowel/bladder control?"     Sharp foot all the way down my left leg to my foot. 10. OTHER SYMPTOMS: "Do you have any other symptoms?" (e.g., fever, abdomen pain, burning with  urination, blood in urine)       Not asked 11. PREGNANCY: "Is there any chance you are pregnant?" "When was your last menstrual period?"       N?A  Protocols used: Back Pain-A-AH

## 2022-07-14 NOTE — Patient Instructions (Addendum)
Based on your symptoms and physical exam I believe the following is the cause of your concern today Back pain likely secondary to a strain of your back muscles  I recommend the following at this time to help relieve that discomfort:  Rest Warm compresses to the area (20 minutes on, minimum of 30 minutes off) You can alternate Tylenol  Tylenol is preferred due to your kidney function  I have sent in script for Prednisone to help with the inflammation I am sending in a script for Robaxin, this is a muscle relaxer, and can make you sleepy so be mindful of this while taking Please continue to monitor your blood sugars while taking the Prednisone and watch your sugar intake.   Gentle stretches and exercises that I have included in your paperwork Try to reduce excess strain to the area and rest as much as possible  Wear supportive shoes and, if you must lift anything, use proper lifting techniques that spare your back.   If these measures do not lead to improvement in your symptoms over the next 2-4 weeks please let us know

## 2022-07-14 NOTE — Progress Notes (Signed)
Acute Office Visit   Patient: Philip CALLAIS Sr.   DOB: 1946/01/08   77 y.o. Adult  MRN: [id number] Visit Date: 07/14/2022  Today's healthcare provider: Dani Gobble Claiborne Stroble, [name]  Introduced myself to the patient as a Journalist, newspaper and provided education on APPs in clinical practice.    Chief Complaint  Patient presents with  . Back Pain    Lower back started about two days ago.    Subjective    Back Pain  HPI     Back Pain    Additional comments: Lower back started about two days ago.       Last edited by Kizzie Furnish, CMA on 07/14/2022  2:24 PM.        Low back pain  Onset: sudden  Reports he tried to get up from his couch 2 days ago and started having pain across the lower back  Duration: 3 days  Location: lower back pain  Pain level and character: 8/10 aching constantly and movement creates a sharp pain  Radiation:sometimes has sharp pain that shoots down to foot  Numbness or tingling: none  Interventions: Tylenol, warm compresses, back brace,  He is on blood thinner - apixaban  Alleviating: Tylenol works for a little bit but does not provide lasting relief  Aggravating: Movement of any kind and especially legs  Injuries or trauma to the area:  He reports he has stopped sugary drinks and is checking blood sugars regularly. Results this AM was 107 fasting   Medications: Outpatient Medications Prior to Visit  Medication Sig  . acetaminophen (TYLENOL) 500 MG tablet Take 1,000 mg by mouth every 8 (eight) hours as needed for moderate pain or mild pain.  . Albuterol Sulfate 108 (90 Base) MCG/ACT AEPB Inhale 2 puffs into the lungs every 6 (six) hours as needed (shortness of breath).   [name] apixaban (ELIQUIS) 5 MG TABS tablet Take 1 tablet (5 mg total) by mouth 2 (two) times daily.  . ARTIFICIAL TEAR SOLUTION OP Place 1 drop into both eyes daily.  [name] atorvastatin (LIPITOR) 40 MG tablet Take 1 tablet by mouth once daily  . Blood Glucose Monitoring Suppl (ONE TOUCH ULTRA  2) w/Device KIT Use to check blood sugar as advised up to twice a day  . BREZTRI AEROSPHERE 160-9-4.8 MCG/ACT AERO Inhale 2 puffs into the lungs 2 (two) times daily.  . calcium carbonate (TUMS - DOSED IN MG ELEMENTAL CALCIUM) 500 MG chewable tablet Chew 1,000 mg by mouth daily as needed for indigestion or heartburn.  . cetirizine (ZYRTEC) 10 MG tablet Take 10 mg by mouth daily.  . cyanocobalamin (VITAMIN B12) 1000 MCG tablet Take 1,000 mcg by mouth daily.  [name] FARXIGA 10 MG TABS tablet Take 1 tablet (10 mg total) by mouth daily before breakfast.  . fluticasone (FLONASE) 50 MCG/ACT nasal spray Place 2 sprays into both nostrils daily.  . furosemide (LASIX) 40 MG tablet Take 0.5-1 tablets (20-40 mg total) by mouth daily as needed for edema. Only as needed now.  . gabapentin (NEURONTIN) 100 MG capsule TAKE 1 CAPSULE BY MOUTH THREE TIMES DAILY  . ipratropium-albuterol (DUONEB) 0.5-2.5 (3) MG/3ML SOLN Take 3 mLs by nebulization every 6 (six) hours as needed (shortness of breath).  [name] LANCETS ULTRA THIN MISC 1 Device by Does not apply route 4 (four) times daily -  before meals and at bedtime.  [name] levothyroxine (SYNTHROID) 88 MCG tablet TAKE 1 TABLET BY MOUTH ONCE DAILY  BEFORE BREAKFAST  . losartan (COZAAR) 25 MG tablet Take 1 tablet (25 mg total) by mouth every morning.  . metFORMIN (GLUCOPHAGE) 500 MG tablet TAKE 2 TABLETS BY MOUTH TWICE DAILY WITH A MEAL  . metoprolol tartrate (LOPRESSOR) 50 MG tablet TAKE 1 & 1/2 (ONE & ONE-HALF) TABLETS BY MOUTH TWICE DAILY  . montelukast (SINGULAIR) 10 MG tablet Take 10 mg by mouth at bedtime.  Glory Rosebush ULTRA test strip USE TO CHECK BLOOD SUGAR TWICE DAILY  . spironolactone (ALDACTONE) 25 MG tablet Take 1/2 (one-half) tablet by mouth once daily   No facility-administered medications prior to visit.    Review of Systems  Musculoskeletal:  Positive for back pain and myalgias.       Objective    BP 138/86 (BP Location: Left Arm, Patient Position: Sitting, Cuff  Size: Normal)   Pulse 82   Temp (!) 96.9 F (36.1 C) (Temporal)   Wt 192 lb (87.1 kg)   SpO2 99%   BMI 27.55 kg/m    Physical Exam Vitals reviewed.  Constitutional:      General: He is awake.     Appearance: Normal appearance. He is well-developed and well-groomed.  HENT:     Head: Normocephalic and atraumatic.  Eyes:     Extraocular Movements: Extraocular movements intact.     Conjunctiva/sclera: Conjunctivae normal.  Pulmonary:     Effort: Pulmonary effort is normal.  Musculoskeletal:     Cervical back: Normal range of motion and neck supple. No swelling, edema, deformity, erythema or bony tenderness. No pain with movement. Normal range of motion.     Thoracic back: Spasms present. No swelling, deformity, tenderness or bony tenderness. Decreased range of motion.     Lumbar back: Spasms present. No swelling, edema, deformity, tenderness or bony tenderness. Normal range of motion. Negative right straight leg raise test and negative left straight leg raise test.     Right hip: Normal. Normal range of motion. Normal strength.     Left hip: Normal. Normal range of motion. Normal strength.     Comments: Pain with lateral rotation and lateral flexion to both sides ROM intact with extension and forward flexion. Hip ROM and strength intact   Neurological:     Mental Status: He is alert.  Psychiatric:        Behavior: Behavior is cooperative.      No results found for any visits on 07/14/22.  Assessment & Plan      No follow-ups on file.       Problem List Items Addressed This Visit   None Visit Diagnoses     Acute bilateral low back pain without sciatica    -  Primary Acute, new concern He reports sudden low back pain while trying to ambulate from seated position 2 days ago that has progressed to constant aches with sharp pains on movement HPI and PE seem most consistent with muscular back pain at this time Will treat with conservative measures: recommend Tylenol for  pain relief, will send Prednisone taper and Robaxin for further relief He is on blood thinner so NSAIDs are not recommended right now Reviewed monitoring his blood glucose levels while taking Prednisone and safety warnings with Robaxin regarding sedation Follow up as needed - if not improving or getting worse in the next 4-6 weeks may need to refer to PT and ortho as well as get imaging     Relevant Medications   predniSONE (DELTASONE) 20 MG tablet   methocarbamol (ROBAXIN-750)  750 MG tablet        No follow-ups on file.   I, Jahmiya Guidotti E Chimaobi Casebolt, [name], have reviewed all documentation for this visit. The documentation on 07/14/22 for the exam, diagnosis, procedures, and orders are all accurate and complete.   Talitha Givens, MHS, [name] [facility]

## 2022-07-15 ENCOUNTER — Other Ambulatory Visit: Payer: Self-pay | Admitting: Cardiovascular Disease

## 2022-07-17 ENCOUNTER — Telehealth: Payer: Self-pay | Admitting: Family Medicine

## 2022-07-17 NOTE — Telephone Encounter (Signed)
Contacted [name] to schedule their annual wellness visit. Appointment made for 08/18/2022.  Sherol Dade; Care Guide Ambulatory Clinical [facility] Direct Dial: [phone]

## 2022-07-17 NOTE — Telephone Encounter (Signed)
Contacted Philip Potters Sr. to schedule their annual wellness visit. Call back at later date: 07/17/22  *Sherol Dade; [facility] Direct Dial: [phone]

## 2022-08-02 ENCOUNTER — Other Ambulatory Visit: Payer: Self-pay | Admitting: Family Medicine

## 2022-08-02 DIAGNOSIS — E1142 Type 2 diabetes mellitus with diabetic polyneuropathy: Secondary | ICD-10-CM

## 2022-08-02 NOTE — Telephone Encounter (Signed)
Requested Prescriptions  Pending Prescriptions Disp Refills   metFORMIN (GLUCOPHAGE) 500 MG tablet [Pharmacy Med Name: metFORMIN HCl 500 MG Oral Tablet] 360 tablet 0    Sig: TAKE 2 TABLETS BY MOUTH TWICE DAILY WITH A MEAL     Endocrinology:  Diabetes - Biguanides Failed - 08/02/2022 11:01 AM      Failed - HBA1C is between 0 and 7.9 and within 180 days    Hemoglobin A1C  Date Value Ref Range Status  06/01/2020 8.7  Final    Comment:    [employer] Nurse   Hgb A1c MFr Bld  Date Value Ref Range Status  07/27/2021 7.1 (H) <5.7 % of total Hgb Final    Comment:    For someone without known diabetes, a hemoglobin A1c value of 6.5% or greater indicates that they may have  diabetes and this should be confirmed with a follow-up  test. . For someone with known diabetes, a value <7% indicates  that their diabetes is well controlled and a value  greater than or equal to 7% indicates suboptimal  control. A1c targets should be individualized based on  duration of diabetes, age, comorbid conditions, and  other considerations. . Currently, no consensus exists regarding use of hemoglobin A1c for diagnosis of diabetes for children. .          Failed - CBC within normal limits and completed in the last 12 months    WBC  Date Value Ref Range Status  03/03/2022 9.1 4.0 - 10.5 K/uL Final   RBC  Date Value Ref Range Status  03/03/2022 4.94 4.22 - 5.81 MIL/uL Final   Hemoglobin  Date Value Ref Range Status  03/03/2022 13.8 13.0 - 17.0 g/dL Final  05/31/2020 15.2 13.0 - 17.7 g/dL Final   HCT  Date Value Ref Range Status  03/03/2022 42.4 39.0 - 52.0 % Final   Hematocrit  Date Value Ref Range Status  05/31/2020 45.2 37.5 - 51.0 % Final   MCHC  Date Value Ref Range Status  03/03/2022 32.5 30.0 - 36.0 g/dL Final   [facility]  Date Value Ref Range Status  03/03/2022 27.9 26.0 - 34.0 pg Final   MCV  Date Value Ref Range Status  03/03/2022 85.8 80.0 - 100.0 fL Final  05/31/2020 87 79 - 97 fL Final    No results found for: "PLTCOUNTKUC", "LABPLAT", "POCPLA" RDW  Date Value Ref Range Status  03/03/2022 14.3 11.5 - 15.5 % Final  05/31/2020 12.9 11.6 - 15.4 % Final         Passed - Cr in normal range and within 360 days    Creat  Date Value Ref Range Status  07/27/2021 1.13 0.70 - 1.28 mg/dL Final   Creatinine, Ser  Date Value Ref Range Status  03/03/2022 1.05 0.61 - 1.24 mg/dL Final         Passed - eGFR in normal range and within 360 days    GFR, Est African American  Date Value Ref Range Status  06/24/2018 85 > OR = 60 mL/min/1.54m2 Final   GFR calc Af Amer  Date Value Ref Range Status  05/31/2020 67 >59 mL/min/1.73 Final    Comment:    **In accordance with recommendations from the NKF-ASN Task force,**   [facility] is in the process of updating its eGFR calculation to the   2021 CKD-EPI creatinine equation that estimates kidney function   without a race variable.    GFR, Est Non African American  Date Value Ref Range  Status  06/24/2018 74 > OR = 60 mL/min/1.29m2 Final   GFR, Estimated  Date Value Ref Range Status  03/03/2022 >60 >60 mL/min Final    Comment:    (NOTE) Calculated using the CKD-EPI Creatinine Equation (2021)    eGFR  Date Value Ref Range Status  07/27/2021 68 > OR = 60 mL/min/1.64m2 Final    Comment:    The eGFR is based on the CKD-EPI 2021 equation. To calculate  the new eGFR from a previous Creatinine or Cystatin C result, go to https://www.kidney.org/professionals/ kdoqi/gfr%5Fcalculator          Passed - B12 Level in normal range and within 720 days    Vitamin B-12  Date Value Ref Range Status  07/27/2021 253 200 - 1,100 pg/mL Final    Comment:    . Please Note: Although the reference range for vitamin B12 is [phone] pg/mL, it has been reported that between 5 and 10% of patients with values between 200 and 400 pg/mL may experience neuropsychiatric and hematologic abnormalities due to occult B12 deficiency; less than 1% of  patients with values above 400 pg/mL will have symptoms. Renella Cunas - Valid encounter within last 6 months    Recent Outpatient Visits           2 weeks ago Acute bilateral low back pain without sciatica   [place], [place]   3 weeks ago Centrilobular emphysema [facility])   [facility] D[facility], Grayland Ormond A, RPH-CPP   1 month ago Centrilobular emphysema [facility])   [facility] Delles, Grayland Ormond A, RPH-CPP   2 months ago Centrilobular emphysema [facility])   [place], Grayland Ormond A, RPH-CPP   2 months ago Type 2 diabetes mellitus with peripheral neuropathy [facility])   [place], Virl Diamond, RPH-CPP       Future Appointments             In 5 days [name], [name], DO [facility], [place]   In 2 weeks [name], MD [place] at [facility]

## 2022-08-07 ENCOUNTER — Ambulatory Visit (INDEPENDENT_AMBULATORY_CARE_PROVIDER_SITE_OTHER): Payer: Medicare Other | Admitting: Family Medicine

## 2022-08-07 ENCOUNTER — Encounter: Payer: Self-pay | Admitting: Family Medicine

## 2022-08-07 ENCOUNTER — Ambulatory Visit: Payer: Medicare Other | Admitting: Pharmacist

## 2022-08-07 VITALS — BP 100/60 | HR 57 | Resp 15 | Ht 70.0 in | Wt 195.8 lb

## 2022-08-07 DIAGNOSIS — N182 Chronic kidney disease, stage 2 (mild): Secondary | ICD-10-CM

## 2022-08-07 DIAGNOSIS — E1142 Type 2 diabetes mellitus with diabetic polyneuropathy: Secondary | ICD-10-CM | POA: Diagnosis not present

## 2022-08-07 DIAGNOSIS — E034 Atrophy of thyroid (acquired): Secondary | ICD-10-CM

## 2022-08-07 DIAGNOSIS — E1169 Type 2 diabetes mellitus with other specified complication: Secondary | ICD-10-CM | POA: Diagnosis not present

## 2022-08-07 DIAGNOSIS — E785 Hyperlipidemia, unspecified: Secondary | ICD-10-CM

## 2022-08-07 DIAGNOSIS — Z0000 Encounter for general adult medical examination without abnormal findings: Secondary | ICD-10-CM | POA: Diagnosis not present

## 2022-08-07 DIAGNOSIS — I129 Hypertensive chronic kidney disease with stage 1 through stage 4 chronic kidney disease, or unspecified chronic kidney disease: Secondary | ICD-10-CM

## 2022-08-07 DIAGNOSIS — J432 Centrilobular emphysema: Secondary | ICD-10-CM | POA: Diagnosis not present

## 2022-08-07 DIAGNOSIS — R351 Nocturia: Secondary | ICD-10-CM

## 2022-08-07 MED ORDER — METFORMIN HCL 500 MG PO TABS: 1000.0000 mg | ORAL_TABLET | Freq: Two times a day (BID) | ORAL | 3 refills | Status: DC

## 2022-08-07 MED ORDER — LEVOTHYROXINE SODIUM 88 MCG PO TABS: 88.0000 ug | ORAL_TABLET | Freq: Every day | ORAL | 3 refills | Status: DC

## 2022-08-07 NOTE — Assessment & Plan Note (Addendum)
Followed by [facility] Pulm Dr Meredeth Ide Off chronic prednisone On current COPD maintenance, Breztri with success, significant clinical symptom improvement Receiving from [employer] support PAP Follow with Pulm as needed

## 2022-08-07 NOTE — Assessment & Plan Note (Signed)
Controlled cholesterol on statin and improved lifestyle ? ? ? ?Plan: ?1. Continue current meds - Atorvastatin 40mg daily ?2. Encourage improved lifestyle - low carb/cholesterol, reduce portion size, continue improving regular exercise ?

## 2022-08-07 NOTE — Assessment & Plan Note (Signed)
Well-controlled HTN ?Complication with CKD-II ?  ? ?Plan:  ?1. Continue current BP regimen - Losartan 25 mg daily ?2. Encourage improved lifestyle - low sodium diet, regular exercise ?3. Continue monitor BP outside office, bring readings to next visit, if persistently >140/90 or new symptoms notify office sooner ?

## 2022-08-07 NOTE — Assessment & Plan Note (Signed)
Prior results TSH mild elevated, T4 normal. Check lab today Continue Euthyrox daily

## 2022-08-07 NOTE — Patient Instructions (Addendum)
Thank you for coming to the office today.  Keep up the great work!  I will check for Farxiga 10mg  samples. And if need we can order a 30 day or short term supply to local pharmacy while they sort out your patient assistance program  I have contacted Gentry Fitz and Noreene Larsson and hopefully we can get it sorted.  Keep up on Breztri inhaler.  Labs today, and stay tuned for results.  Please schedule a Follow-up Appointment to: Return in about 6 months (around 02/06/2023) for 6 month DM A1c, Emphysema, HTN, Hypothyroid.  If you have any other questions or concerns, please feel free to call the office or send a message through MyChart. You may also schedule an earlier appointment if necessary.  Additionally, you may be receiving a survey about your experience at our office within a few days to 1 week by e-mail or mail. We value your feedback.  Saralyn Pilar, DO [facility], [place]

## 2022-08-07 NOTE — Progress Notes (Signed)
   08/07/2022  Patient ID: Juanetta Snow Sr., adult   DOB: 03-09-1946, 77 y.o.   MRN: [id number]  Coordination of Care Call  Receive a message from PCP advising patient seen in office today and let provider know that he is out of his [place]. Note patient enrolled in patient assistance for Farxiga from [employer] through 05/01/2023. Note with re-enrollment, program advised next refill would automatically ship based on refill date from previous calendar year.  PCP advises that he is able to give patient #28 day supply of Farxiga 10 mg daily today  Outreach to [employer] program today on behalf of patient. [employer] representative advises patient's Marcelline Deist has not yet shipped for patient as patient did not call for first refill in new calendar year. Advises program now requires this for first fill of new enrollment and then program will automatically ship. Representative advises Marcelline Deist will now ship to patient within the next 10-14 business days.  Follow up with patient to provide update. Also, remind patient to monitor his supply of program medication and if ever gets below 1 month supply remaining, to contact program for status of refill.   Follow Up Plan: Clinical Pharmacist will follow up with patient by telephone on 09/18/2022 at 9:15 AM   [name], PharmD, Patsy Baltimore, CPP Clinical Pharmacist [facility] [phone]

## 2022-08-07 NOTE — Assessment & Plan Note (Signed)
A1c pending lab - No hypoglycemia Complications - CKD-II, peripheral neuropathy, cataracts Remains OFF GLP1 Bydureon (allergy), and Lantus insulin, Januvia  Plan:  Continue Farxiga 10mg  through PAP - out now, will give 28 day sample free today, and will notify clinical pharmacy team and work on request refill for future. Should be shipped within 10-14 days  Continue Metformin 1000mg  BID (500mg  x 2 BID) Encourage improved lifestyle - low carb, low sugar diet, reduce portion size, continue improving regular exercise - emphasized limiting sweets / eating out - he admits these are raising sugar lately Check CBG, bring log to next visit for review Continue ARB, Statin DM Foot exam

## 2022-08-07 NOTE — Progress Notes (Signed)
Subjective:    Patient ID: Philip SnowLetcher C Adeyemi Sr., adult    DOB: 1945/06/20, 77 y.o.   MRN: [id number]  Philip SnowLetcher C Shumard Sr. is a 77 y.o. adult presenting on 08/07/2022 for Annual Exam   HPI  Here for Annual Physical and Lab Orders.   CHRONIC DM, Type 2: Doing well Meds: Metformin 1000mg  twice daily (500mg  tabs), Farxiga 10mg  daily (through PAP assistance) - OFF Januvia, Bydureon, Lantus Reports good compliance. 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 Denies hypoglycemia   Lumbar DDD S/p Lumbar diskectomy 01/07/21, R L3-4 [facility]diske[facility] He has had issue with prolong sitting and issues since surgery but he has done well today and doing better.   HYPERLIPIDEMIA: - Reports no concerns. Last lipid panel 06/2021, controlled on Statin - Currently taking Atorvastatin 40mg , tolerating well without side effects or myalgias Due for labs   Hypothyroidism On Euthyrox 88mcg daily, needs new order and lab Last Thyroid panel TSH up to 5.82, and Free T4 1.2   Centrilobular Emphysema Dramatic improvement on respiratory status with Breztri 2 puff twice a day Breathing much better, less sputum  Not using rescue inhaler or nebulizer Overall much improved   Additional updates   Improved weight and appetite and energy now post Flu previously   Health Maintenance:   PSA 0.67 (negative result, 06/2021)  Future Colon CA Screening, reconsider home cologuard if interested.       08/07/2022    8:54 AM 04/28/2022    3:19 PM 08/11/2021    1:18 PM  Depression screen PHQ 2/9  Decreased Interest 0 0 0  Down, Depressed, Hopeless 0 0 0  PHQ - 2 Score 0 0 0  Altered sleeping 1 0   Tired, decreased energy 1 0   Change in appetite 0 0   Feeling bad or failure about yourself  0 0   Trouble  concentrating 0 0   Moving slowly or fidgety/restless 0 0   Suicidal thoughts 0 0   PHQ-9 Score 2 0   Difficult doing work/chores Not difficult at all Not difficult at all     Past Medical History:  Diagnosis Date   (HFpEF) heart failure with preserved ejection fraction    a. 06/2015 Echo: EF 50-55%, Gr1 DD. Mild MR. Mildly dil LA. Nl RV fxn. Nl PASP.   Arthritis    a. right knee   Asthma    Atrial tachycardia    Chronic anticoagulation    Apixaban   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 etiology.   COPD (chronic obstructive pulmonary disease)    Coronary artery disease    a. 05/2012 Cath: severe 3VD-->CABG x 3 by Dr. Tyrone SageGerhardt in 01/07/2014Krista Blue: LIMA->LAD, SVG->LCX & SVG->RPDA; b. 06/2014 MV: no ischemia/infarct.   DDD (degenerative disc disease), lumbar    Dyspnea    Edema    LEGS/ FEET   Essential hypertension    History of Helicobacter pylori infection    HNP (herniated nucleus pulposus), lumbar    HOH (hard of hearing)    Hypercholesteremia    Hypothyroidism    Low back pain radiating to lower extremity    Lumbar radiculitis    Lumbar stenosis with neurogenic claudication    Neuropathy    PAF (paroxysmal atrial fibrillation)  a. CHA2DS2VASc = 5 (age, CHF, HTN, vascular disase, T2DM) --> Eliquis.   Sepsis 2021   Shingles    Sleep apnea    NO CPAP   Type II diabetes mellitus    Past Surgical History:  Procedure Laterality Date   ANTERIOR CERVICAL DECOMP/DISCECTOMY FUSION  ~ 2009   CARDIAC CATHETERIZATION  05/06/2012   Significant three-vessel coronary artery disease with normal ejection fraction   CATARACT EXTRACTION W/PHACO Right 08/15/2017   Procedure: CATARACT EXTRACTION PHACO AND INTRAOCULAR LENS PLACEMENT (IOC);  Surgeon: Galen Manila, MD;  Location: [facility] ORS;  Service: Ophthalmology;  Laterality: Right;  [place] 00:23 AP% 13.3 CDE 3.11 Fluid pack lot # [id number]   CATARACT EXTRACTION W/PHACO Left 10/03/2017   Procedure: CATARACT EXTRACTION PHACO AND  INTRAOCULAR LENS PLACEMENT (IOC);  Surgeon: Galen Manila, MD;  Location: [facility] ORS;  Service: Ophthalmology;  Laterality: Left;  Lot #[id number] H [place]: 00:21.2 AP%:13.9 CDE: 2.96   CORONARY ARTERY BYPASS GRAFT  05/07/2012   Procedure: CORONARY ARTERY BYPASS GRAFTING (CABG);  Surgeon: Delight Ovens, MD;  Location: [facility] OR;  Service: Open Heart Surgery;  Laterality: N/A;  times three   INTRAOPERATIVE TRANSESOPHAGEAL ECHOCARDIOGRAM  05/07/2012   Procedure: INTRAOPERATIVE TRANSESOPHAGEAL ECHOCARDIOGRAM;  Surgeon: Delight Ovens, MD;  Location: [facility];  Service: Open Heart Surgery;  Laterality: N/A;   KNEE ARTHROPLASTY Right 03/07/2021   Procedure: COMPUTER ASSISTED TOTAL RIGHT KNEE ARTHROPLASTY -;  Surgeon: Donato Heinz, MD;  Location: [facility] ORS;  Service: Orthopedics;  Laterality: Right;   KNEE ARTHROSCOPY Right ~ 2000   KNEE SURGERY Right 2003   LUMBAR LAMINECTOMY/DECOMPRESSION MICRODISCECTOMY Right 01/07/2021   Procedure: RIGHT L3-4 MICRODISCECTOMY 1 LEVEL;  Surgeon: Venetia Night, MD;  Location: [facility] ORS;  Service: Neurosurgery;  Laterality: Right;   TEE WITHOUT CARDIOVERSION N/A 07/04/2019   Procedure: TRANSESOPHAGEAL ECHOCARDIOGRAM (TEE);  Surgeon: Yvonne Kendall, MD;  Location: [facility] ORS;  Service: Cardiovascular;  Laterality: N/A;   Social History   Socioeconomic History   Marital status: Married    Spouse name: Clide Dales   Number of children: 4   Years of education: Not on file   Highest education level: Some college, no degree  Occupational History   Occupation: retired  Tobacco Use   Smoking status: Former    Packs/day: 1.00    Years: 8.00    Additional pack years: 0.00    Total pack years: 8.00    Types: Cigarettes    Quit date: 12/31/1967    Years since quitting: 54.6   Smokeless tobacco: Former  Building services engineer Use: Never used  Substance and Sexual Activity   Alcohol use: No    Alcohol/week: 0.0 standard drinks of alcohol    Comment: 05/06/2012 "last  alcohol several years ago; never had problem wit"   Drug use: No   Sexual activity: Not Currently  Other Topics Concern   Not on file  Social History Narrative   ** Merged History Encounter **          Working part time - not currently working because of covid-19    Social Determinants of Corporate investment banker Strain: Low Risk  (08/11/2021)   Overall Financial Resource Strain (CARDIA)    Difficulty of Paying Living Expenses: Not hard at all  Food Insecurity: No Food Insecurity (05/15/2022)   Hunger Vital Sign    Worried About Running Out of Food in the Last Year: Never true    Ran Out of Food in the Last Year:  Never true  Transportation Needs: No Transportation Needs (05/15/2022)   PRAPARE - Administrator, Civil Service (Medical): No    Lack of Transportation (Non-Medical): No  Physical Activity: Insufficiently Active (08/11/2021)   Exercise Vital Sign    Days of Exercise per Week: 7 days    Minutes of Exercise per Session: 10 min  Stress: No Stress Concern Present (08/11/2021)   [employer] of Occupational Health - Occupational Stress Questionnaire    Feeling of Stress : Not at all  Social Connections: Socially Integrated (08/11/2021)   Social Connection and Isolation Panel [NHANES]    Frequency of Communication with Friends and Family: More than three times a week    Frequency of Social Gatherings with Friends and Family: More than three times a week    Attends Religious Services: More than 4 times per year    Active Member of [employer] or Organizations: Yes    Attends Engineer, structural: More than 4 times per year    Marital Status: Married  Catering manager Violence: Not At Risk (08/11/2021)   Humiliation, Afraid, Rape, and Kick questionnaire    Fear of Current or Ex-Partner: No    Emotionally Abused: No    Physically Abused: No    Sexually Abused: No   Family History  Problem Relation Age of Onset   Heart disease Mother    Clotting  disorder Mother    Hypertension Sister    Current Outpatient Medications on File Prior to Visit  Medication Sig   acetaminophen (TYLENOL) 500 MG tablet Take 1,000 mg by mouth every 8 (eight) hours as needed for moderate pain or mild pain.   Albuterol Sulfate 108 (90 Base) MCG/ACT AEPB Inhale 2 puffs into the lungs every 6 (six) hours as needed (shortness of breath).    apixaban (ELIQUIS) 5 MG TABS tablet Take 1 tablet (5 mg total) by mouth 2 (two) times daily.   ARTIFICIAL TEAR SOLUTION OP Place 1 drop into both eyes daily.   atorvastatin (LIPITOR) 40 MG tablet Take 1 tablet by mouth once daily   Blood Glucose Monitoring Suppl (ONE TOUCH ULTRA 2) w/Device KIT Use to check blood sugar as advised up to twice a day   BREZTRI AEROSPHERE 160-9-4.8 MCG/ACT AERO Inhale 2 puffs into the lungs 2 (two) times daily.   calcium carbonate (TUMS - DOSED IN MG ELEMENTAL CALCIUM) 500 MG chewable tablet Chew 1,000 mg by mouth daily as needed for indigestion or heartburn.   cetirizine (ZYRTEC) 10 MG tablet Take 10 mg by mouth daily.   cyanocobalamin (VITAMIN B12) 1000 MCG tablet Take 1,000 mcg by mouth daily.   FARXIGA 10 MG TABS tablet Take 1 tablet (10 mg total) by mouth daily before breakfast.   fluticasone (FLONASE) 50 MCG/ACT nasal spray Place 2 sprays into both nostrils daily.   furosemide (LASIX) 40 MG tablet Take 0.5-1 tablets (20-40 mg total) by mouth daily as needed for edema. Only as needed now.   gabapentin (NEURONTIN) 100 MG capsule TAKE 1 CAPSULE BY MOUTH THREE TIMES DAILY   ipratropium-albuterol (DUONEB) 0.5-2.5 (3) MG/3ML SOLN Take 3 mLs by nebulization every 6 (six) hours as needed (shortness of breath).   LANCETS ULTRA THIN MISC 1 Device by Does not apply route 4 (four) times daily -  before meals and at bedtime.   losartan (COZAAR) 25 MG tablet TAKE 1 TABLET BY MOUTH ONCE DAILY IN THE MORNING   metoprolol tartrate (LOPRESSOR) 50 MG tablet TAKE 1 & 1/2 (  ONE & ONE-HALF) TABLETS BY MOUTH TWICE  DAILY   ONETOUCH ULTRA test strip USE TO CHECK BLOOD SUGAR TWICE DAILY   spironolactone (ALDACTONE) 25 MG tablet Take 1/2 (one-half) tablet by mouth once daily   methocarbamol (ROBAXIN-750) 750 MG tablet Take 1 tablet (750 mg total) by mouth every 8 (eight) hours as needed for muscle spasms. (Patient not taking: Reported on 08/07/2022)   No current facility-administered medications on file prior to visit.    Review of Systems  Constitutional:  Negative for activity change, appetite change, chills, diaphoresis, fatigue and fever.  HENT:  Negative for congestion and hearing loss.   Eyes:  Negative for visual disturbance.  Respiratory:  Negative for cough, chest tightness, shortness of breath and wheezing.   Cardiovascular:  Negative for chest pain, palpitations and leg swelling.  Gastrointestinal:  Negative for abdominal pain, constipation, diarrhea, nausea and vomiting.  Genitourinary:  Negative for dysuria, frequency and hematuria.  Musculoskeletal:  Negative for arthralgias and neck pain.  Skin:  Negative for rash.  Neurological:  Negative for dizziness, weakness, light-headedness, numbness and headaches.  Hematological:  Negative for adenopathy.  Psychiatric/Behavioral:  Negative for behavioral problems, dysphoric mood and sleep disturbance.    Per HPI unless specifically indicated above      Objective:    BP 100/60 (BP Location: Left Arm, Patient Position: Sitting, Cuff Size: Normal)   Pulse (!) 57   Resp 15   Ht 5\' 10"  (1.778 m)   Wt 195 lb 12.8 oz (88.8 kg)   BMI 28.09 kg/m   Wt Readings from Last 3 Encounters:  08/07/22 195 lb 12.8 oz (88.8 kg)  07/14/22 192 lb (87.1 kg)  04/28/22 184 lb 3.2 oz (83.6 kg)    Physical Exam Vitals and nursing note reviewed.  Constitutional:      General: He is not in acute distress.    Appearance: He is well-developed. He is not diaphoretic.     Comments: Well-appearing, comfortable, cooperative  HENT:     Head: Normocephalic and  atraumatic.  Eyes:     General:        Right eye: No discharge.        Left eye: No discharge.     Conjunctiva/sclera: Conjunctivae normal.     Pupils: Pupils are equal, round, and reactive to light.  Neck:     Thyroid: No thyromegaly.  Cardiovascular:     Rate and Rhythm: Normal rate and regular rhythm.     Pulses: Normal pulses.     Heart sounds: Normal heart sounds. No murmur heard. Pulmonary:     Effort: Pulmonary effort is normal. No respiratory distress.     Breath sounds: Normal breath sounds. No wheezing or rales.  Abdominal:     General: Bowel sounds are normal. There is no distension.     Palpations: Abdomen is soft. There is no mass.     Tenderness: There is no abdominal tenderness.  Musculoskeletal:        General: No tenderness. Normal range of motion.     Cervical back: Normal range of motion and neck supple.     Right lower leg: No edema (trace only with varicose veins bilateral).     Left lower leg: No edema.     Comments: Upper / Lower Extremities: - Normal muscle tone, strength bilateral upper extremities 5/5, lower extremities 5/5  Lymphadenopathy:     Cervical: No cervical adenopathy.  Skin:    General: Skin is warm and dry.  Findings: No erythema or rash.  Neurological:     Mental Status: He is alert and oriented to person, place, and time.     Comments: Distal sensation intact to light touch all extremities  Psychiatric:        Mood and Affect: Mood normal.        Behavior: Behavior normal.        Thought Content: Thought content normal.     Comments: Well groomed, good eye contact, normal speech and thoughts    Results for orders placed or performed in visit on 06/06/22  HM DIABETES EYE EXAM  Result Value Ref Range   HM Diabetic Eye Exam No Retinopathy No Retinopathy      Assessment & Plan:   Problem List Items Addressed This Visit     Benign hypertension with CKD (chronic kidney disease), stage II    Well-controlled HTN Complication  with CKD-II    Plan:  1. Continue current BP regimen - Losartan 25 mg daily 2. Encourage improved lifestyle - low sodium diet, regular exercise 3. Continue monitor BP outside office, bring readings to next visit, if persistently >140/90 or new symptoms notify office sooner      Relevant Orders   COMPLETE METABOLIC PANEL WITH GFR   CBC with Differential/Platelet   Centrilobular emphysema    Followed by [facility] Dr Meredeth Ide Off chronic prednisone On current COPD maintenance, Breztri with success, significant clinical symptom improvement Receiving from [facility] support PAP Follow with Pulm as needed      Hyperlipidemia associated with type 2 diabetes mellitus    Controlled cholesterol on statin and improved lifestyle  Plan: 1. Continue current meds - Atorvastatin 40mg  daily 2. Encourage improved lifestyle - low carb/cholesterol, reduce portion size, continue improving regular exercise      Relevant Medications   metFORMIN (GLUCOPHAGE) 500 MG tablet   Other Relevant Orders   COMPLETE METABOLIC PANEL WITH GFR   Lipid panel   Hypothyroid    Prior results TSH mild elevated, T4 normal. Check lab today Continue Euthyrox daily      Relevant Medications   levothyroxine (SYNTHROID) 88 MCG tablet   Other Relevant Orders   TSH   T4, free   Type 2 diabetes mellitus with peripheral neuropathy    A1c pending lab - No hypoglycemia Complications - CKD-II, peripheral neuropathy, cataracts Remains OFF GLP1 Bydureon (allergy), and Lantus insulin, Januvia  Plan:  Continue Farxiga 10mg  through PAP - out now, will give 28 day sample free today, and will notify clinical pharmacy team and work on request refill for future. Should be shipped within 10-14 days  Continue Metformin 1000mg  BID (500mg  x 2 BID) Encourage improved lifestyle - low carb, low sugar diet, reduce portion size, continue improving regular exercise - emphasized limiting sweets / eating out - he admits these are raising  sugar lately Check CBG, bring log to next visit for review Continue ARB, Statin DM Foot exam      Relevant Medications   metFORMIN (GLUCOPHAGE) 500 MG tablet   Other Relevant Orders   Urine Microalbumin w/creat. ratio   Other Visit Diagnoses     Annual physical exam    -  Primary   Relevant Orders   COMPLETE METABOLIC PANEL WITH GFR   CBC with Differential/Platelet   Hemoglobin A1c   Lipid panel   Nocturia       Relevant Orders   PSA       Updated Health Maintenance information Fasting lab today  pending results. Encouraged improvement to lifestyle with diet and exercise Goal of weight loss   Forward chart to Gentry Fitz / Noreene Larsson to check on [facility] Farxiga Update, they will request the refill order now and it should be sent 10-14 days   Meds ordered this encounter  Medications   levothyroxine (SYNTHROID) 88 MCG tablet    Sig: Take 1 tablet (88 mcg total) by mouth daily before breakfast.    Dispense:  90 tablet    Refill:  3   metFORMIN (GLUCOPHAGE) 500 MG tablet    Sig: Take 2 tablets (1,000 mg total) by mouth 2 (two) times daily with a meal.    Dispense:  360 tablet    Refill:  3      Follow up plan: Return in about 6 months (around 02/06/2023) for 6 month DM A1c, Emphysema, HTN, Hypothyroid.  Saralyn Pilar, DO [facility] 08/07/2022, 9:08 AM

## 2022-08-07 NOTE — Patient Instructions (Signed)
Goals Addressed             This Visit's Progress    Pharmacy Goals       Our goal A1c is less than 7%. This corresponds with fasting sugars less than 130 and 2 hour after meal sugars less than 180. Please check your blood sugar and keep log of results  Please continue to monitor your home weight, blood pressure and heart rate  Our goal bad cholesterol, or LDL, is less than 70 . This is why it is important to continue taking your atorvastatin  Feel free to call me with any questions or concerns. I look forward to our next call!    Dellene Mcgroarty Yurem Viner, PharmD, BCACP Clinical Pharmacist [facility] [place] [phone]         

## 2022-08-08 LAB — COMPLETE METABOLIC PANEL WITH GFR
AG Ratio: 2 (calc) (ref 1.0–2.5)
ALT: 13 U/L (ref 9–46)
AST: 13 U/L (ref 10–35)
Albumin: 3.9 g/dL (ref 3.6–5.1)
Alkaline phosphatase (APISO): 91 U/L (ref 35–144)
BUN: 18 mg/dL (ref 7–25)
CO2: 30 mmol/L (ref 20–32)
Calcium: 9.1 mg/dL (ref 8.6–10.3)
Chloride: 106 mmol/L (ref 98–110)
Creat: 0.99 mg/dL (ref 0.70–1.28)
Globulin: 2 g/dL (calc) (ref 1.9–3.7)
Glucose, Bld: 118 mg/dL — ABNORMAL HIGH (ref 65–99)
Potassium: 4.6 mmol/L (ref 3.5–5.3)
Sodium: 144 mmol/L (ref 135–146)
Total Bilirubin: 0.5 mg/dL (ref 0.2–1.2)
Total Protein: 5.9 g/dL — ABNORMAL LOW (ref 6.1–8.1)
eGFR: 79 mL/min/{1.73_m2} (ref >=60)

## 2022-08-08 LAB — LIPID PANEL
Cholesterol: 120 mg/dL (ref <200)
HDL: 56 mg/dL (ref >=40)
LDL Cholesterol (Calc): 48 mg/dL (calc)
Non-HDL Cholesterol (Calc): 64 mg/dL (calc) (ref <130)
Total CHOL/HDL Ratio: 2.1 (calc) (ref <5.0)
Triglycerides: 75 mg/dL (ref <150)

## 2022-08-08 LAB — CBC WITH DIFFERENTIAL/PLATELET
Absolute Monocytes: 831 cells/uL (ref 200–950)
Basophils Absolute: 81 cells/uL (ref 0–200)
Basophils Relative: 1.3 %
Eosinophils Absolute: 229 cells/uL (ref 15–500)
Eosinophils Relative: 3.7 %
HCT: 43 % (ref 38.5–50.0)
Hemoglobin: 14.2 g/dL (ref 13.2–17.1)
Lymphs Abs: 1178 cells/uL (ref 850–3900)
MCH: 29.5 pg (ref 27.0–33.0)
MCHC: 33 g/dL (ref 32.0–36.0)
MCV: 89.2 fL (ref 80.0–100.0)
MPV: 10.4 fL (ref 7.5–12.5)
Monocytes Relative: 13.4 %
Neutro Abs: 3881 cells/uL (ref 1500–7800)
Neutrophils Relative %: 62.6 %
Platelets: 246 10*3/uL (ref 140–400)
RBC: 4.82 10*6/uL (ref 4.20–5.80)
RDW: 14.6 % (ref 11.0–15.0)
Total Lymphocyte: 19 %
WBC: 6.2 10*3/uL (ref 3.8–10.8)

## 2022-08-08 LAB — T4, FREE: Free T4: 1.3 ng/dL (ref 0.8–1.8)

## 2022-08-08 LAB — PSA: PSA: 0.61 ng/mL (ref <=4.00)

## 2022-08-08 LAB — MICROALBUMIN / CREATININE URINE RATIO
Creatinine, Urine: 84 mg/dL (ref 20–320)
Microalb Creat Ratio: 5 mg/g creat (ref <30)
Microalb, Ur: 0.4 mg/dL

## 2022-08-08 LAB — TSH: TSH: 3.36 mIU/L (ref 0.40–4.50)

## 2022-08-08 LAB — HEMOGLOBIN A1C
Hgb A1c MFr Bld: 7.2 % of total Hgb — ABNORMAL HIGH (ref <5.7)
Mean Plasma Glucose: 160 mg/dL
eAG (mmol/L): 8.9 mmol/L

## 2022-08-10 ENCOUNTER — Encounter: Payer: Medicare Other | Admitting: Family Medicine

## 2022-08-17 ENCOUNTER — Ambulatory Visit: Payer: Medicare Other | Attending: Cardiology | Admitting: Medical

## 2022-08-17 ENCOUNTER — Encounter: Payer: Self-pay | Admitting: Medical

## 2022-08-17 VITALS — BP 104/68 | HR 75 | Ht 70.0 in | Wt 197.8 lb

## 2022-08-17 DIAGNOSIS — G4733 Obstructive sleep apnea (adult) (pediatric): Secondary | ICD-10-CM | POA: Diagnosis not present

## 2022-08-17 DIAGNOSIS — I4821 Permanent atrial fibrillation: Secondary | ICD-10-CM

## 2022-08-17 DIAGNOSIS — I5022 Chronic systolic (congestive) heart failure: Secondary | ICD-10-CM

## 2022-08-17 DIAGNOSIS — E782 Mixed hyperlipidemia: Secondary | ICD-10-CM | POA: Diagnosis not present

## 2022-08-17 DIAGNOSIS — J449 Chronic obstructive pulmonary disease, unspecified: Secondary | ICD-10-CM | POA: Diagnosis not present

## 2022-08-17 DIAGNOSIS — J301 Allergic rhinitis due to pollen: Secondary | ICD-10-CM | POA: Diagnosis not present

## 2022-08-17 DIAGNOSIS — I25118 Atherosclerotic heart disease of native coronary artery with other forms of angina pectoris: Secondary | ICD-10-CM

## 2022-08-17 NOTE — Progress Notes (Signed)
Cardiology Office Note:    Date:  08/17/2022   ID:  Philip Snow Sr., DOB 08/06/1945, MRN [id number]  PCP:  Smitty Cords, DO  [facility] Cardiologist:  [name], MD  [facility] Electrophysiologist:  None   Referring MD: Saralyn Pilar *   Chief Complaint: 6 month follow-up  History of Present Illness:    Philip Snow Sr. is a 77 y.o. adult with a hx of CAD s/p CABG, chronic systolic heart failure, chronic afib, atrial tachycardia who presents for follow-up.   The patient underwent CABG in January 2014. He had post-op afib which was treated with amiodarone.    He was hospitalized in February 2021 with sepsis secondary to PNA and MSSA bacteremia complicated by atrial flutter with RVR. Echo showed LVEF 40-45% with global HK, mildly reduced RV function, moderate pulmonary HTN, mild to moderate MR. TEE showed an EF of 30-35% with no evidence of vegetations.    He had a Lexiscan Myoview in March of 2021 which showed no evidence of ischemia. Echo in October 2022 showed an EF 40-45% with normal LV function.   Patient was last seen October 2023 and was doing reasonably well with no chest pain.  He reported stable dyspnea on exertion.  Patient had an issue with cost in regards to [facility].  Today, the patient is overall doing OK. The patient has been doing well since that time. He denies chest pain or shortness of breath. He has stable COPD. He has occasional lower leg edema and takes lasix. He has lost 10lbs in the last 5 days. He denies bleeding issues with afib. Does not need refills of medications.   Past Medical History:  Diagnosis Date   (HFpEF) heart failure with preserved ejection fraction    a. 06/2015 Echo: EF 50-55%, Gr1 DD. Mild MR. Mildly dil LA. Nl RV fxn. Nl PASP.   Arthritis    a. right knee   Asthma    Atrial tachycardia    Chronic anticoagulation    Apixaban   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 etiology.   COPD (chronic  obstructive pulmonary disease)    Coronary artery disease    a. 05/2012 Cath: severe 3VD-->CABG x 3 by Dr. Tyrone Sage in 01/07/2014Krista Blue, SVG->LCX & SVG->RPDA; b. 06/2014 MV: no ischemia/infarct.   DDD (degenerative disc disease), lumbar    Dyspnea    Edema    LEGS/ FEET   Essential hypertension    History of Helicobacter pylori infection    HNP (herniated nucleus pulposus), lumbar    HOH (hard of hearing)    Hypercholesteremia    Hypothyroidism    Low back pain radiating to lower extremity    Lumbar radiculitis    Lumbar stenosis with neurogenic claudication    Neuropathy    PAF (paroxysmal atrial fibrillation)    a. CHA2DS2VASc = 5 (age, CHF, HTN, vascular disase, T2DM) --> Eliquis.   Sepsis 2021   Shingles    Sleep apnea    NO CPAP   Type II diabetes mellitus     Past Surgical History:  Procedure Laterality Date   ANTERIOR CERVICAL DECOMP/DISCECTOMY FUSION  ~ 2009   CARDIAC CATHETERIZATION  05/06/2012   Significant three-vessel coronary artery disease with normal ejection fraction   CATARACT EXTRACTION W/PHACO Right 08/15/2017   Procedure: CATARACT EXTRACTION PHACO AND INTRAOCULAR LENS PLACEMENT (IOC);  Surgeon: Galen Manila, MD;  Location: [facility] ORS;  Service: Ophthalmology;  Laterality: Right;  [place]  00:23 AP% 13.3 CDE 3.11 Fluid pack lot # [id number]   CATARACT EXTRACTION W/PHACO Left 10/03/2017   Procedure: CATARACT EXTRACTION PHACO AND INTRAOCULAR LENS PLACEMENT (IOC);  Surgeon: Galen Manila, MD;  Location: [facility] ORS;  Service: Ophthalmology;  Laterality: Left;  Lot #[id number] H [place]: 00:21.2 AP%:13.9 CDE: 2.96   CORONARY ARTERY BYPASS GRAFT  05/07/2012   Procedure: CORONARY ARTERY BYPASS GRAFTING (CABG);  Surgeon: Delight Ovens, MD;  Location: [facility] OR;  Service: Open Heart Surgery;  Laterality: N/A;  times three   INTRAOPERATIVE TRANSESOPHAGEAL ECHOCARDIOGRAM  05/07/2012   Procedure: INTRAOPERATIVE TRANSESOPHAGEAL ECHOCARDIOGRAM;  Surgeon: Delight Ovens, MD;   Location: [facility] OR;  Service: Open Heart Surgery;  Laterality: N/A;   KNEE ARTHROPLASTY Right 03/07/2021   Procedure: COMPUTER ASSISTED TOTAL RIGHT KNEE ARTHROPLASTY -;  Surgeon: Donato Heinz, MD;  Location: [facility] ORS;  Service: Orthopedics;  Laterality: Right;   KNEE ARTHROSCOPY Right ~ 2000   KNEE SURGERY Right 2003   LUMBAR LAMINECTOMY/DECOMPRESSION MICRODISCECTOMY Right 01/07/2021   Procedure: RIGHT L3-4 MICRODISCECTOMY 1 LEVEL;  Surgeon: Venetia Night, MD;  Location: [facility] ORS;  Service: Neurosurgery;  Laterality: Right;   TEE WITHOUT CARDIOVERSION N/A 07/04/2019   Procedure: TRANSESOPHAGEAL ECHOCARDIOGRAM (TEE);  Surgeon: Yvonne Kendall, MD;  Location: [facility] ORS;  Service: Cardiovascular;  Laterality: N/A;    Current Medications: Current Meds  Medication Sig   acetaminophen (TYLENOL) 500 MG tablet Take 1,000 mg by mouth every 8 (eight) hours as needed for moderate pain or mild pain.   Albuterol Sulfate 108 (90 Base) MCG/ACT AEPB Inhale 2 puffs into the lungs every 6 (six) hours as needed (shortness of breath).    apixaban (ELIQUIS) 5 MG TABS tablet Take 1 tablet (5 mg total) by mouth 2 (two) times daily.   ARTIFICIAL TEAR SOLUTION OP Place 1 drop into both eyes daily.   atorvastatin (LIPITOR) 40 MG tablet Take 1 tablet by mouth once daily   Blood Glucose Monitoring Suppl (ONE TOUCH ULTRA 2) w/Device KIT Use to check blood sugar as advised up to twice a day   BREZTRI AEROSPHERE 160-9-4.8 MCG/ACT AERO Inhale 2 puffs into the lungs 2 (two) times daily.   calcium carbonate (TUMS - DOSED IN MG ELEMENTAL CALCIUM) 500 MG chewable tablet Chew 1,000 mg by mouth daily as needed for indigestion or heartburn.   cetirizine (ZYRTEC) 10 MG tablet Take 10 mg by mouth daily.   cyanocobalamin (VITAMIN B12) 1000 MCG tablet Take 1,000 mcg by mouth daily.   FARXIGA 10 MG TABS tablet Take 1 tablet (10 mg total) by mouth daily before breakfast.   fluticasone (FLONASE) 50 MCG/ACT nasal spray Place 2 sprays  into both nostrils daily.   furosemide (LASIX) 40 MG tablet Take 0.5-1 tablets (20-40 mg total) by mouth daily as needed for edema. Only as needed now.   gabapentin (NEURONTIN) 100 MG capsule TAKE 1 CAPSULE BY MOUTH THREE TIMES DAILY   ipratropium-albuterol (DUONEB) 0.5-2.5 (3) MG/3ML SOLN Take 3 mLs by nebulization every 6 (six) hours as needed (shortness of breath).   LANCETS ULTRA THIN MISC 1 Device by Does not apply route 4 (four) times daily -  before meals and at bedtime.   levothyroxine (SYNTHROID) 88 MCG tablet Take 1 tablet (88 mcg total) by mouth daily before breakfast.   losartan (COZAAR) 25 MG tablet TAKE 1 TABLET BY MOUTH ONCE DAILY IN THE MORNING   metFORMIN (GLUCOPHAGE) 500 MG tablet Take 2 tablets (1,000 mg total) by mouth 2 (two) times daily with a meal.  methocarbamol (ROBAXIN-750) 750 MG tablet Take 1 tablet (750 mg total) by mouth every 8 (eight) hours as needed for muscle spasms.   metoprolol tartrate (LOPRESSOR) 50 MG tablet TAKE 1 & 1/2 (ONE & ONE-HALF) TABLETS BY MOUTH TWICE DAILY   ONETOUCH ULTRA test strip USE TO CHECK BLOOD SUGAR TWICE DAILY   spironolactone (ALDACTONE) 25 MG tablet Take 1/2 (one-half) tablet by mouth once daily     Allergies:   Bydureon [exenatide], Clindamycin/lincomycin, Penicillins, Sulfa antibiotics, Tiotropium, Penicillin g, Spiriferis, Spiriva handihaler [tiotropium bromide monohydrate], Iodinated contrast media, and Other   Social History   Socioeconomic History   Marital status: Married    Spouse name: Clide Dales   Number of children: 4   Years of education: Not on file   Highest education level: Some college, no degree  Occupational History   Occupation: retired  Tobacco Use   Smoking status: Former    Packs/day: 1.00    Years: 8.00    Additional pack years: 0.00    Total pack years: 8.00    Types: Cigarettes    Quit date: 12/31/1967    Years since quitting: 54.6   Smokeless tobacco: Former  Building services engineer Use: Never used   Substance and Sexual Activity   Alcohol use: No    Alcohol/week: 0.0 standard drinks of alcohol    Comment: 05/06/2012 "last alcohol several years ago; never had problem wit"   Drug use: No   Sexual activity: Not Currently  Other Topics Concern   Not on file  Social History Narrative   ** Merged History Encounter **          Working part time - not currently working because of covid-19    Social Determinants of Corporate investment banker Strain: Low Risk  (08/11/2021)   Overall Financial Resource Strain (CARDIA)    Difficulty of Paying Living Expenses: Not hard at all  Food Insecurity: No Food Insecurity (05/15/2022)   Hunger Vital Sign    Worried About Running Out of Food in the Last Year: Never true    Ran Out of Food in the Last Year: Never true  Transportation Needs: No Transportation Needs (05/15/2022)   PRAPARE - Administrator, Civil Service (Medical): No    Lack of Transportation (Non-Medical): No  Physical Activity: Insufficiently Active (08/11/2021)   Exercise Vital Sign    Days of Exercise per Week: 7 days    Minutes of Exercise per Session: 10 min  Stress: No Stress Concern Present (08/11/2021)   [employer] of Occupational Health - Occupational Stress Questionnaire    Feeling of Stress : Not at all  Social Connections: Socially Integrated (08/11/2021)   Social Connection and Isolation Panel [NHANES]    Frequency of Communication with Friends and Family: More than three times a week    Frequency of Social Gatherings with Friends and Family: More than three times a week    Attends Religious Services: More than 4 times per year    Active Member of [employer] or Organizations: Yes    Attends Engineer, structural: More than 4 times per year    Marital Status: Married     Family History: The patient's family history includes Clotting disorder in his mother; Heart disease in his mother; Hypertension in his sister.  ROS:   Please see the history of  present illness.     All other systems reviewed and are negative.  EKGs/Labs/Other Studies Reviewed:  The following studies were reviewed today:  Echo limited 01/2021  1. Left ventricular ejection fraction, by estimation, is 40 to 45%. The  left ventricle has mildly decreased function. The left ventricle  demonstrates global hypokinesis. Left ventricular diastolic parameters are  indeterminate.   2. Right ventricular systolic function is normal. The right ventricular  size is normal.   3. The mitral valve is normal in structure. Trivial mitral valve  regurgitation. No evidence of mitral stenosis.   4. The aortic valve is normal in structure. Aortic valve regurgitation is  not visualized. No aortic stenosis is present.   5. The inferior vena cava is normal in size with greater than 50%  respiratory variability, suggesting right atrial pressure of 3 mmHg.    Echo 08/2019   1. Left ventricular ejection fraction, by estimation, is 40 to 45%. The  left ventricle has mildly decreased function. The left ventricle  demonstrates global hypokinesis. Left ventricular diastolic parameters are  indeterminate.   2. Right ventricular systolic function is normal. The right ventricular  size is normal. Tricuspid regurgitation signal is inadequate for assessing  PA pressure.   3. Left atrial size was moderately dilated.   4. The mitral valve is normal in structure. Moderate mitral valve  regurgitation. No evidence of mitral stenosis.   5. Tricuspid valve regurgitation is mild to moderate.    Myoview Lexiscan 06/2019 Narrative & Impression  The study is normal. This is a low risk study. The left ventricular ejection fraction is normal (63%). There is no evidence for ischemia    EKG:  EKG is ordered today.  The ekg ordered today demonstrates Afib 75bpm, nonspecific ST changes  Recent Labs: 08/07/2022: ALT 13; BUN 18; Creat 0.99; Hemoglobin 14.2; Platelets 246; Potassium 4.6; Sodium 144; TSH  3.36  Recent Lipid Panel    Component Value Date/Time   CHOL 120 08/07/2022 0932   CHOL 139 12/14/2014 0834   TRIG 75 08/07/2022 0932   HDL 56 08/07/2022 0932   HDL 43 12/14/2014 0834   CHOLHDL 2.1 08/07/2022 0932   VLDL 20 11/21/2019 0915   LDLCALC 48 08/07/2022 0932    Physical Exam:    VS:  BP 104/68   Pulse 75   Ht  (1.778 m)   Wt 197 lb 12.8 oz (89.7 kg)   SpO2 95%   BMI 28.38 kg/m     Wt Readings from Last 3 Encounters:  08/17/22 197 lb 12.8 oz (89.7 kg)  08/07/22 195 lb 12.8 oz (88.8 kg)  07/14/22 192 lb (87.1 kg)     GEN:  Well nourished, well developed in no acute distress HEENT: Normal NECK: No JVD; No carotid bruits LYMPHATICS: No lymphadenopathy CARDIAC: RRR, no murmurs, rubs, gallops RESPIRATORY:  Clear to auscultation without rales, wheezing or rhonchi  ABDOMEN: Soft, non-tender, non-distended MUSCULOSKELETAL:  No edema; No deformity  SKIN: Warm and dry NEUROLOGIC:  Alert and oriented x 3 PSYCHIATRIC:  Normal affect   ASSESSMENT:    1. Permanent atrial fibrillation   2. Coronary artery disease involving native coronary artery of native heart with other form of angina pectoris   3. Chronic systolic heart failure   4. Hyperlipidemia, mixed    PLAN:    In order of problems listed above:  Permanent Afib He is in rate controlled afib today on EKG. He takes Eliquis  BID for stroke ppx. Continue Lopressor for rate control.  CAD The patient denies angina. Continue Lipitor and Lopressor. No ASA with Eliquis. He  had a normal Myoview in 2021.   Chronic systolic heart failure LVEF 40-45% Patient appears euvolemic on exam today. Most recent echo 2022 showed LVEF 40-45%, global HK, normal RVSF, trivial MR. Cost issues with Entresto. Continue Losartan  daily, Lopressor  BID, Farxiga  daily, and spironolactone 12.5mg  daily. He takes lasix 20-40 mg daily for swelling.   HLD LDL 48. Continue Lipitor  daily.   Disposition: Follow up  in 6 month(s) with MD/APP    Signed, Aerik Polan David Stall, [name]  08/17/2022 3:09 PM    [facility]

## 2022-08-17 NOTE — Patient Instructions (Signed)
Medication Instructions:  Your Physician recommend you continue on your current medication as directed.     *If you need a refill on your cardiac medications before your next appointment, please call your pharmacy*   Lab Work: No labs ordered today.  If you have labs (blood work) drawn today and your tests are completely normal, you will receive your results only by: MyChart Message (if you have MyChart) OR A paper copy in the mail If you have any lab test that is abnormal or we need to change your treatment, we will call you to review the results.   Testing/Procedures: No test ordered today.    Follow-Up: At [facility], you and your health needs are our priority.  As part of our continuing mission to provide you with exceptional heart care, we have created designated Provider Care Teams.  These Care Teams include your primary Cardiologist (physician) and Advanced Practice Providers (APPs -  Physician Assistants and Nurse Practitioners) who all work together to provide you with the care you need, when you need it.  We recommend signing up for the patient portal called "MyChart".  Sign up information is provided on this After Visit Summary.  MyChart is used to connect with patients for Virtual Visits (Telemedicine).  Patients are able to view lab/test results, encounter notes, upcoming appointments, etc.  Non-urgent messages can be sent to your provider as well.   To learn more about what you can do with MyChart, go to [email or web address].    Your next appointment:   6 month(s)  Provider:   You may see [name], MD or one of the following Advanced Practice Providers on your designated Care Team:   Nicolasa Ducking, NP Eula Listen, [name] Cadence Fransico Michael, [name] Charlsie Quest, NP

## 2022-08-18 ENCOUNTER — Ambulatory Visit: Payer: Medicare Other | Admitting: Cardiology

## 2022-08-18 ENCOUNTER — Ambulatory Visit (INDEPENDENT_AMBULATORY_CARE_PROVIDER_SITE_OTHER): Payer: Medicare Other

## 2022-08-18 ENCOUNTER — Ambulatory Visit: Payer: Medicare Other | Admitting: Cardiovascular Disease

## 2022-08-18 VITALS — BP 98/60 | Ht 71.0 in | Wt 196.6 lb

## 2022-08-18 DIAGNOSIS — Z0000 Encounter for general adult medical examination without abnormal findings: Secondary | ICD-10-CM | POA: Diagnosis not present

## 2022-08-18 NOTE — Patient Instructions (Signed)
[name] , Thank you for taking time to come for your Medicare Wellness Visit. I appreciate your ongoing commitment to your health goals. Please review the following plan we discussed and let me know if I can assist you in the future.   These are the goals we discussed:  Goals       DIET - EAT MORE FRUITS AND VEGETABLES      Increase water intake      Recommend drinking 6-8 glasses of water a day.      Patient Stated      08/03/2020, no goals      Pharmacy Goals      Our goal A1c is less than 7%. This corresponds with fasting sugars less than 130 and 2 hour after meal sugars less than 180. Please check your blood sugar and keep log of results.   Please continue to monitor your home weight, blood pressure and heart rate  Our goal bad cholesterol, or LDL, is less than 70 . This is why it is important to continue taking your atorvastatin  Feel free to call me with any questions or concerns. I look forward to our next call!  [name], PharmD, BCACP Clinical Pharmacist [facility] [phone]       RN-I would like to work on my COPD (pt-stated)      Current Barriers:  Knowledge deficits related to basic understanding of COPD disease process Knowledge deficits related to basic COPD self care/management Knowledge deficit related to basic understanding of how to use inhalers and how inhaled medications work   Case Manager Clinical Goal(s): Over the next 90 days patient will report using inhalers as prescribed including rinsing mouth after use Over the next 90 days patient will report utilizing pursed lip breathing for shortness of breath Over the next 90 days, patient will be able to verbalize understanding of COPD action plan and when to seek appropriate levels of medical care Over the next 90 days, patient will engage in lite exercise as tolerated to build/regain stamina and strength and reduce shortness of breath through activity tolerance Over  the next 90 days, patient will verbalize basic understanding of COPD disease process and self care activities Over the next 90 days, patient will not be hospitalized for COPD exacerbation   Interventions:  Provided patient with basic written and verbal COPD education on self care/management/and exacerbation prevention  Provided patient with COPD action plan and reinforced importance of daily self assessment Discussed Pulmonary Rehab and offered to assist with referral placement Advised patient to self assesses COPD action plan zone and make appointment with provider if in the yellow zone for 48 hours without improvement. Provided patient with education about the role of exercise in the management of COPD Advised patient to engage in light exercise as tolerated 3-5 days a week  Reviewed COPD education and information about pulmonary rehab being open and benefits-Patient continues to want to wait on this, states at next pulmonary appointment patient has to have PFTs and testing, will make a determination at that time. Talked with patient about recent follow ups with pulmonary, PCP and cardiology. Patient felt he was doing well, still having some mild congestion but overall well.  Discussed Holiday eating and maintaining sugar control.   Patient Self Care Activities: Takes medications as prescribed including inhalers Engages in light exercise 3-5 days a week   Please see past updates related to this goal by clicking on the "Past Updates" button in  the selected goal        Weight (lb) < 200 lb (90.7 kg)      The pt is currently working on losing weight. He would like to lose 30 lbs within a year.       Weight < 200 lb (90.719 kg)      Pt wants to loose 30 pounds within year.        This is a list of the screening recommended for you and due dates:  Health Maintenance  Topic Date Due   Zoster (Shingles) Vaccine (1 of 2) Never done   DEXA scan (bone density measurement)  Never done    DTaP/Tdap/Td vaccine (2 - Td or Tdap) 05/01/2022   Cologuard (Stool DNA test)  08/07/2023*   Flu Shot  11/30/2022   Hemoglobin A1C  02/06/2023   Eye exam for diabetics  06/01/2023   Yearly kidney function blood test for diabetes  08/07/2023   Yearly kidney health urinalysis for diabetes  08/07/2023   Complete foot exam   08/07/2023   Medicare Annual Wellness Visit  08/18/2023   Pneumonia Vaccine  Completed   Hepatitis C Screening: USPSTF Recommendation to screen - Ages 18-79 yo.  Completed   HPV Vaccine  Aged Out   Colon Cancer Screening  Discontinued   COVID-19 Vaccine  Discontinued  *Topic was postponed. The date shown is not the original due date.    Advanced directives: NO  Conditions/risks identified: NONE  Next appointment: Follow up in one year for your annual wellness visit. 08/24/23 @ 11:00 AM IN PERSON  Preventive Care 65 Years and Older, Male  Preventive care refers to lifestyle choices and visits with your health care provider that can promote health and wellness. What does preventive care include? A yearly physical exam. This is also called an annual well check. Dental exams once or twice a year. Routine eye exams. Ask your health care provider how often you should have your eyes checked. Personal lifestyle choices, including: Daily care of your teeth and gums. Regular physical activity. Eating a healthy diet. Avoiding tobacco and drug use. Limiting alcohol use. Practicing safe sex. Taking low doses of aspirin every day. Taking vitamin and mineral supplements as recommended by your health care provider. What happens during an annual well check? The services and screenings done by your health care provider during your annual well check will depend on your age, overall health, lifestyle risk factors, and family history of disease. Counseling  Your health care provider may ask you questions about your: Alcohol use. Tobacco use. Drug use. Emotional  well-being. Home and relationship well-being. Sexual activity. Eating habits. History of falls. Memory and ability to understand (cognition). Work and work Astronomer. Screening  You may have the following tests or measurements: Height, weight, and BMI. Blood pressure. Lipid and cholesterol levels. These may be checked every 5 years, or more frequently if you are over 20 years old. Skin check. Lung cancer screening. You may have this screening every year starting at age 4 if you have a 30-pack-year history of smoking and currently smoke or have quit within the past 15 years. Fecal occult blood test (FOBT) of the stool. You may have this test every year starting at age 83. Flexible sigmoidoscopy or colonoscopy. You may have a sigmoidoscopy every 5 years or a colonoscopy every 10 years starting at age 24. Prostate cancer screening. Recommendations will vary depending on your family history and other risks. Hepatitis C blood test. Hepatitis B blood  test. Sexually transmitted disease (STD) testing. Diabetes screening. This is done by checking your blood sugar (glucose) after you have not eaten for a while (fasting). You may have this done every 1-3 years. Abdominal aortic aneurysm (AAA) screening. You may need this if you are a current or former smoker. Osteoporosis. You may be screened starting at age 28 if you are at high risk. Talk with your health care provider about your test results, treatment options, and if necessary, the need for more tests. Vaccines  Your health care provider may recommend certain vaccines, such as: Influenza vaccine. This is recommended every year. Tetanus, diphtheria, and acellular pertussis (Tdap, Td) vaccine. You may need a Td booster every 10 years. Zoster vaccine. You may need this after age 38. Pneumococcal 13-valent conjugate (PCV13) vaccine. One dose is recommended after age 6. Pneumococcal polysaccharide (PPSV23) vaccine. One dose is recommended after  age 45. Talk to your health care provider about which screenings and vaccines you need and how often you need them. This information is not intended to replace advice given to you by your health care provider. Make sure you discuss any questions you have with your health care provider. Document Released: 05/14/2015 Document Revised: 01/05/2016 Document Reviewed: 02/16/2015 Elsevier Interactive Patient Education  2017 [employer].  Fall Prevention in the Home Falls can cause injuries. They can happen to people of all ages. There are many things you can do to make your home safe and to help prevent falls. What can I do on the outside of my home? Regularly fix the edges of walkways and driveways and fix any cracks. Remove anything that might make you trip as you walk through a door, such as a raised step or threshold. Trim any bushes or trees on the path to your home. Use bright outdoor lighting. Clear any walking paths of anything that might make someone trip, such as rocks or tools. Regularly check to see if handrails are loose or broken. Make sure that both sides of any steps have handrails. Any raised decks and porches should have guardrails on the edges. Have any leaves, snow, or ice cleared regularly. Use sand or salt on walking paths during winter. Clean up any spills in your garage right away. This includes oil or grease spills. What can I do in the bathroom? Use night lights. Install grab bars by the toilet and in the tub and shower. Do not use towel bars as grab bars. Use non-skid mats or decals in the tub or shower. If you need to sit down in the shower, use a plastic, non-slip stool. Keep the floor dry. Clean up any water that spills on the floor as soon as it happens. Remove soap buildup in the tub or shower regularly. Attach bath mats securely with double-sided non-slip rug tape. Do not have throw rugs and other things on the floor that can make you trip. What can I do in the  bedroom? Use night lights. Make sure that you have a light by your bed that is easy to reach. Do not use any sheets or blankets that are too big for your bed. They should not hang down onto the floor. Have a firm chair that has side arms. You can use this for support while you get dressed. Do not have throw rugs and other things on the floor that can make you trip. What can I do in the kitchen? Clean up any spills right away. Avoid walking on wet floors. Keep items that  you use a lot in easy-to-reach places. If you need to reach something above you, use a strong step stool that has a grab bar. Keep electrical cords out of the way. Do not use floor polish or wax that makes floors slippery. If you must use wax, use non-skid floor wax. Do not have throw rugs and other things on the floor that can make you trip. What can I do with my stairs? Do not leave any items on the stairs. Make sure that there are handrails on both sides of the stairs and use them. Fix handrails that are broken or loose. Make sure that handrails are as long as the stairways. Check any carpeting to make sure that it is firmly attached to the stairs. Fix any carpet that is loose or worn. Avoid having throw rugs at the top or bottom of the stairs. If you do have throw rugs, attach them to the floor with carpet tape. Make sure that you have a light switch at the top of the stairs and the bottom of the stairs. If you do not have them, ask someone to add them for you. What else can I do to help prevent falls? Wear shoes that: Do not have high heels. Have rubber bottoms. Are comfortable and fit you well. Are closed at the toe. Do not wear sandals. If you use a stepladder: Make sure that it is fully opened. Do not climb a closed stepladder. Make sure that both sides of the stepladder are locked into place. Ask someone to hold it for you, if possible. Clearly mark and make sure that you can see: Any grab bars or  handrails. First and last steps. Where the edge of each step is. Use tools that help you move around (mobility aids) if they are needed. These include: Canes. Walkers. Scooters. Crutches. Turn on the lights when you go into a dark area. Replace any light bulbs as soon as they burn out. Set up your furniture so you have a clear path. Avoid moving your furniture around. If any of your floors are uneven, fix them. If there are any pets around you, be aware of where they are. Review your medicines with your doctor. Some medicines can make you feel dizzy. This can increase your chance of falling. Ask your doctor what other things that you can do to help prevent falls. This information is not intended to replace advice given to you by your health care provider. Make sure you discuss any questions you have with your health care provider. Document Released: 02/11/2009 Document Revised: 09/23/2015 Document Reviewed: 05/22/2014 Elsevier Interactive Patient Education  2017 [employer].

## 2022-08-18 NOTE — Progress Notes (Signed)
Subjective:   Philip MUNDAY Sr. is a 77 y.o. male who presents for Medicare Annual/Subsequent preventive examination.  Review of Systems     Cardiac Risk Factors include: advanced age (>66men, >57 women);diabetes mellitus;dyslipidemia;male gender     Objective:    Today's Vitals   08/18/22 1303  BP: 98/60  Weight: 196 lb 9.6 oz (89.2 kg)  Height: 5\' 11"  (1.803 m)   Body mass index is 27.42 kg/m.     08/18/2022    1:10 PM 03/07/2021    7:19 AM 03/02/2021   12:48 PM 01/07/2021    6:31 AM 12/31/2020   11:49 AM 08/03/2020    9:47 AM 06/27/2019    2:46 AM  Advanced Directives  Does Patient Have a Medical Advance Directive? No No Yes No No No   Would patient like information on creating a medical advance directive? No - Patient declined No - Patient declined  No - Patient declined Yes (MAU/Ambulatory/Procedural Areas - Information given)  No - Patient declined    Current Medications (verified) Outpatient Encounter Medications as of 08/18/2022  Medication Sig   acetaminophen (TYLENOL) 500 MG tablet Take 1,000 mg by mouth every 8 (eight) hours as needed for moderate pain or mild pain.   Albuterol Sulfate 108 (90 Base) MCG/ACT AEPB Inhale 2 puffs into the lungs every 6 (six) hours as needed (shortness of breath).    apixaban (ELIQUIS) 5 MG TABS tablet Take 1 tablet (5 mg total) by mouth 2 (two) times daily.   ARTIFICIAL TEAR SOLUTION OP Place 1 drop into both eyes daily.   atorvastatin (LIPITOR) 40 MG tablet Take 1 tablet by mouth once daily   Blood Glucose Monitoring Suppl (ONE TOUCH ULTRA 2) w/Device KIT Use to check blood sugar as advised up to twice a day   BREZTRI AEROSPHERE 160-9-4.8 MCG/ACT AERO Inhale 2 puffs into the lungs 2 (two) times daily.   calcium carbonate (TUMS - DOSED IN MG ELEMENTAL CALCIUM) 500 MG chewable tablet Chew 1,000 mg by mouth daily as needed for indigestion or heartburn.   cetirizine (ZYRTEC) 10 MG tablet Take 10 mg by mouth daily.   cyanocobalamin (VITAMIN  B12) 1000 MCG tablet Take 1,000 mcg by mouth daily.   FARXIGA 10 MG TABS tablet Take 1 tablet (10 mg total) by mouth daily before breakfast.   fluticasone (FLONASE) 50 MCG/ACT nasal spray Place 2 sprays into both nostrils daily.   furosemide (LASIX) 40 MG tablet Take 0.5-1 tablets (20-40 mg total) by mouth daily as needed for edema. Only as needed now.   gabapentin (NEURONTIN) 100 MG capsule TAKE 1 CAPSULE BY MOUTH THREE TIMES DAILY   ipratropium-albuterol (DUONEB) 0.5-2.5 (3) MG/3ML SOLN Take 3 mLs by nebulization every 6 (six) hours as needed (shortness of breath).   LANCETS ULTRA THIN MISC 1 Device by Does not apply route 4 (four) times daily -  before meals and at bedtime.   levothyroxine (SYNTHROID) 88 MCG tablet Take 1 tablet (88 mcg total) by mouth daily before breakfast.   losartan (COZAAR) 25 MG tablet TAKE 1 TABLET BY MOUTH ONCE DAILY IN THE MORNING   metFORMIN (GLUCOPHAGE) 500 MG tablet Take 2 tablets (1,000 mg total) by mouth 2 (two) times daily with a meal.   metoprolol tartrate (LOPRESSOR) 50 MG tablet TAKE 1 & 1/2 (ONE & ONE-HALF) TABLETS BY MOUTH TWICE DAILY   ONETOUCH ULTRA test strip USE TO CHECK BLOOD SUGAR TWICE DAILY   spironolactone (ALDACTONE) 25 MG tablet Take 1/2 (one-half)  tablet by mouth once daily   methocarbamol (ROBAXIN-750) 750 MG tablet Take 1 tablet (750 mg total) by mouth every 8 (eight) hours as needed for muscle spasms. (Patient not taking: Reported on 08/18/2022)   No facility-administered encounter medications on file as of 08/18/2022.    Allergies (verified) Bydureon [exenatide], Clindamycin/lincomycin, Penicillins, Sulfa antibiotics, Tiotropium, Penicillin g, Spiriferis, Spiriva handihaler [tiotropium bromide monohydrate], Iodinated contrast media, and Other   History: Past Medical History:  Diagnosis Date   (HFpEF) heart failure with preserved ejection fraction    a. 06/2015 Echo: EF 50-55%, Gr1 DD. Mild MR. Mildly dil LA. Nl RV fxn. Nl PASP.    Arthritis    a. right knee   Asthma    Atrial tachycardia    Chronic anticoagulation    Apixaban   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 etiology.   COPD (chronic obstructive pulmonary disease)    Coronary artery disease    a. 05/2012 Cath: severe 3VD-->CABG x 3 by Dr. Tyrone Sage in 01/07/2014[place]rista [place], SVG->LCX & SVG->RPDA; b. 06/2014 MV: no ischemia/infarct.   DDD (degenerative disc disease), lumbar    Dyspnea    Edema    LEGS/ FEET   Essential hypertension    History of Helicobacter pylori infection    HNP (herniated nucleus pulposus), lumbar    HOH (hard of hearing)    Hypercholesteremia    Hypothyroidism    Low back pain radiating to lower extremity    Lumbar radiculitis    Lumbar stenosis with neurogenic claudication    Neuropathy    PAF (paroxysmal atrial fibrillation)    a. CHA2DS2VASc = 5 (age, CHF, HTN, vascular disase, T2DM) --> Eliquis.   Sepsis 2021   Shingles    Sleep apnea    NO CPAP   Type II diabetes mellitus    Past Surgical History:  Procedure Laterality Date   ANTERIOR CERVICAL DECOMP/DISCECTOMY FUSION  ~ 2009   CARDIAC CATHETERIZATION  05/06/2012   Significant three-vessel coronary artery disease with normal ejection fraction   CATARACT EXTRACTION W/PHACO Right 08/15/2017   Procedure: CATARACT EXTRACTION PHACO AND INTRAOCULAR LENS PLACEMENT (IOC);  Surgeon: Galen Manila, MD;  Location: [facility] ORS;  Service: Ophthalmology;  Laterality: Right;  [place] 00:23 AP% 13.3 CDE 3.11 Fluid pack lot # [id number]   CATARACT EXTRACTION W/PHACO Left 10/03/2017   Procedure: CATARACT EXTRACTION PHACO AND INTRAOCULAR LENS PLACEMENT (IOC);  Surgeon: Galen Manila, MD;  Location: [facility] ORS;  Service: Ophthalmology;  Laterality: Left;  Lot #[id number] H [place]: 00:21.2 AP%:13.9 CDE: 2.96   CORONARY ARTERY BYPASS GRAFT  05/07/2012   Procedure: CORONARY ARTERY BYPASS GRAFTING (CABG);  Surgeon: Delight Ovens, MD;  [facility];  Service: Open Heart Surgery;  Laterality:  N/A;  times three   INTRAOPERATIVE TRANSESOPHAGEAL ECHOCARDIOGRAM  05/07/2012   Procedure: INTRAOPERATIVE TRANSESOPHAGEAL ECHOCARDIOGRAM;  Surgeon: Delight Ovens, MD;  Location: [facility] OR;  Service: Open Heart Surgery;  Laterality: N/A;   KNEE ARTHROPLASTY Right 03/07/2021   Procedure: COMPUTER ASSISTED TOTAL RIGHT KNEE ARTHROPLASTY -;  Surgeon: Donato Heinz, MD;  Location: [facility] ORS;  Service: Orthopedics;  Laterality: Right;   KNEE ARTHROSCOPY Right ~ 2000   KNEE SURGERY Right 2003   LUMBAR LAMINECTOMY/DECOMPRESSION MICRODISCECTOMY Right 01/07/2021   Procedure: RIGHT L3-4 MICRODISCECTOMY 1 LEVEL;  Surgeon: Venetia Night, MD;  Location: [facility] ORS;  Service: Neurosurgery;  Laterality: Right;   TEE WITHOUT CARDIOVERSION N/A 07/04/2019   Procedure: TRANSESOPHAGEAL ECHOCARDIOGRAM (TEE);  Surgeon: Yvonne Kendall, MD;  Location:  [facility] ORS;  Service: Cardiovascular;  Laterality: N/A;   Family History  Problem Relation Age of Onset   Heart disease Mother    Clotting disorder Mother    Hypertension Sister    Social History   Socioeconomic History   Marital status: Married    Spouse name: Clide Dales   Number of children: 4   Years of education: Not on file   Highest education level: Some college, no degree  Occupational History   Occupation: retired  Tobacco Use   Smoking status: Former    Packs/day: 1.00    Years: 8.00    Additional pack years: 0.00    Total pack years: 8.00    Types: Cigarettes    Quit date: 12/31/1967    Years since quitting: 54.6   Smokeless tobacco: Former  Building services engineer Use: Never used  Substance and Sexual Activity   Alcohol use: No    Alcohol/week: 0.0 standard drinks of alcohol    Comment: 05/06/2012 "last alcohol several years ago; never had problem wit"   Drug use: No   Sexual activity: Not Currently  Other Topics Concern   Not on file  Social History Narrative   ** Merged History Encounter **          Working part time - not currently  working because of covid-19    Social Determinants of Corporate investment banker Strain: Low Risk  (08/18/2022)   Overall Financial Resource Strain (CARDIA)    Difficulty of Paying Living Expenses: Not hard at all  Food Insecurity: No Food Insecurity (08/18/2022)   Hunger Vital Sign    Worried About Running Out of Food in the Last Year: Never true    Ran Out of Food in the Last Year: Never true  Transportation Needs: No Transportation Needs (08/18/2022)   PRAPARE - Administrator, Civil Service (Medical): No    Lack of Transportation (Non-Medical): No  Physical Activity: Insufficiently Active (08/18/2022)   Exercise Vital Sign    Days of Exercise per Week: 3 days    Minutes of Exercise per Session: 30 min  Stress: No Stress Concern Present (08/18/2022)   [employer] of Occupational Health - Occupational Stress Questionnaire    Feeling of Stress : Not at all  Social Connections: Moderately Integrated (08/18/2022)   Social Connection and Isolation Panel [NHANES]    Frequency of Communication with Friends and Family: More than three times a week    Frequency of Social Gatherings with Friends and Family: Twice a week    Attends Religious Services: More than 4 times per year    Active Member of [employer] or Organizations: No    Attends Engineer, structural: Never    Marital Status: Married    Tobacco Counseling Counseling given: Not Answered   Clinical Intake:  Pre-visit preparation completed: Yes  Pain : No/denies pain     Nutritional Risks: None Diabetes: Yes CBG done?: No Did pt. bring in CBG monitor from home?: No  How often do you need to have someone help you when you read instructions, pamphlets, or other written materials from your doctor or pharmacy?: 1 - Never  Diabetic?YES Nutrition Risk Assessment:  Has the patient had any N/V/D within the last 2 months?  Yes  Does the patient have any non-healing wounds?  No  Has the patient had any  unintentional weight loss or weight gain?  No   Diabetes:  Is the patient diabetic?  Yes  If diabetic, was a CBG obtained today?  No  Did the patient bring in their glucometer from home?  No  How often do you monitor your CBG's? TWICE/DAY.   Financial Strains and Diabetes Management:  Are you having any financial strains with the device, your supplies or your medication? No .  Does the patient want to be seen by Chronic Care Management for management of their diabetes?  No  Would the patient like to be referred to a Nutritionist or for Diabetic Management?  No   Diabetic Exams:  Diabetic Eye Exam: Completed 05/31/22.  Pt has been advised about the importance in completing this exam.  Diabetic Foot Exam: Completed 08/07/22. Pt has been advised about the importance in completing this exam.   Interpreter Needed?: No  Information entered by :: Kennedy Bucker, LPN   Activities of Daily Living    08/18/2022    1:11 PM 08/07/2022    8:54 AM  In your present state of health, do you have any difficulty performing the following activities:  Hearing? 1 1  Vision? 0 0  Difficulty concentrating or making decisions? 0 0  Walking or climbing stairs? 0 0  Dressing or bathing? 0 0  Doing errands, shopping? 0 0  Preparing Food and eating ? N   Using the Toilet? N   In the past six months, have you accidently leaked urine? N   Do you have problems with loss of bowel control? N   Managing your Medications? N   Managing your Finances? N   Housekeeping or managing your Housekeeping? N     Patient Care Team: Smitty Cords, DO as PCP - General (Family Medicine) [name], MD as PCP - Cardiology (Cardiology) [name], MD as Consulting Physician (Cardiology) Erin Sons, MD (Inactive) (Orthopedic Surgery) Mertie Moores, MD as Referring Physician (Specialist) Ronney Asters Jackelyn Poling, RPH-CPP as Pharmacist Kemper Durie, RN as [facility] Care  Management  Indicate any recent Medical Services you may have received from other than [facility] providers in the past year (date may be approximate).     Assessment:   This is a routine wellness examination for Philip [name].  Hearing/Vision screen Hearing Screening - Comments:: WEARS AIDS Vision Screening - Comments:: READERS- DR.PORFILIO  Dietary issues and exercise activities discussed: Current Exercise Habits: Home exercise routine, Type of exercise: walking, Time (Minutes): 30, Frequency (Times/Week): 3, Weekly Exercise (Minutes/Week): 90, Intensity: Mild   Goals Addressed             This Visit's Progress    DIET - EAT MORE FRUITS AND VEGETABLES         Depression Screen    08/18/2022    1:09 PM 08/07/2022    8:54 AM 04/28/2022    3:19 PM 08/11/2021    1:18 PM 01/31/2021    1:57 PM 08/03/2020    9:48 AM 03/12/2019    9:16 AM  PHQ 2/9 Scores  PHQ - 2 Score 0 0 0 0 3 0 0  PHQ- 9 Score 0 2 0  12      Fall Risk    08/18/2022    1:11 PM 08/07/2022    8:54 AM 04/28/2022    3:19 PM 08/11/2021    1:17 PM 01/31/2021    1:57 PM  Fall Risk   Falls in the past year? 0 0 0 0 0  Number falls in past yr: 0 0 0 0 0  Injury with Fall? 0  0 0 0 0  Risk for fall due to : No Fall Risks No Fall Risks No Fall Risks No Fall Risks   Follow up Falls prevention discussed;Falls evaluation completed Falls evaluation completed Falls evaluation completed Falls evaluation completed Falls evaluation completed    FALL RISK PREVENTION PERTAINING TO THE HOME:  Any stairs in or around the home? Yes  If so, are there any without handrails? No  Home free of loose throw rugs in walkways, pet beds, electrical cords, etc? Yes  Adequate lighting in your home to reduce risk of falls? Yes   ASSISTIVE DEVICES UTILIZED TO PREVENT FALLS:  Life alert? No  Use of a cane, walker or w/c? No  Grab bars in the bathroom? Yes  Shower chair or bench in shower? Yes  Elevated toilet seat or a handicapped toilet? Yes    TIMED UP AND GO:  Was the test performed? Yes .  Length of time to ambulate 10 feet: 4 sec.   Gait steady and fast without use of assistive device  Cognitive Function:    01/06/2015    2:01 PM  MMSE - Mini Mental State Exam  Orientation to time 5  Orientation to Place 5  Registration 3  Attention/ Calculation 5  Recall 3  Language- name 2 objects 2  Language- repeat 1  Language- follow 3 step command 3  Language- read & follow direction 1  Write a sentence 1  Copy design 1  Total score 30        08/18/2022    1:19 PM 08/11/2021    1:21 PM 08/03/2020    9:51 AM 12/31/2018    8:57 AM 12/25/2017    3:13 PM  6CIT Screen  What Year? 0 points 0 points 0 points 0 points 0 points  What month? 0 points 0 points 0 points 0 points 0 points  What time? 0 points 0 points 0 points 0 points 0 points  Count back from 20 0 points 0 points 0 points 0 points 0 points  Months in reverse 0 points 0 points 0 points 0 points 0 points  Repeat phrase 2 points 2 points 0 points 0 points 0 points  Total Score 2 points 2 points 0 points 0 points 0 points    Immunizations Immunization History  Administered Date(s) Administered   Fluad Quad(high Dose 65+) 12/31/2018, 01/31/2021   Influenza, High Dose Seasonal PF 01/06/2015, 02/28/2017   Influenza-Unspecified 02/08/2014, 03/01/2018   Pneumococcal Conjugate-13 01/02/2014   Pneumococcal Polysaccharide-23 05/01/2012   Tdap 05/01/2012    TDAP status: Due, Education has been provided regarding the importance of this vaccine. Advised may receive this vaccine at local pharmacy or Health Dept. Aware to provide a copy of the vaccination record if obtained from local pharmacy or Health Dept. Verbalized acceptance and understanding.  Flu Vaccine status: Declined, Education has been provided regarding the importance of this vaccine but patient still declined. Advised may receive this vaccine at local pharmacy or Health Dept. Aware to provide a copy of the  vaccination record if obtained from local pharmacy or Health Dept. Verbalized acceptance and understanding.  Pneumococcal vaccine status: Up to date  Covid-19 vaccine status: Declined, Education has been provided regarding the importance of this vaccine but patient still declined. Advised may receive this vaccine at local pharmacy or Health Dept.or vaccine clinic. Aware to provide a copy of the vaccination record if obtained from local pharmacy or Health Dept. Verbalized acceptance and understanding.  Qualifies for Shingles Vaccine?  Yes   Zostavax completed No   Shingrix Completed?: No.    Education has been provided regarding the importance of this vaccine. Patient has been advised to call insurance company to determine out of pocket expense if they have not yet received this vaccine. Advised may also receive vaccine at local pharmacy or Health Dept. Verbalized acceptance and understanding.  Screening Tests Health Maintenance  Topic Date Due   Zoster Vaccines- Shingrix (1 of 2) Never done   DEXA SCAN  Never done   DTaP/Tdap/Td (2 - Td or Tdap) 05/01/2022   Fecal DNA (Cologuard)  08/07/2023 (Originally 03/25/1991)   INFLUENZA VACCINE  11/30/2022   HEMOGLOBIN A1C  02/06/2023   OPHTHALMOLOGY EXAM  06/01/2023   Diabetic kidney evaluation - eGFR measurement  08/07/2023   Diabetic kidney evaluation - Urine ACR  08/07/2023   FOOT EXAM  08/07/2023   Medicare Annual Wellness (AWV)  08/18/2023   Pneumonia Vaccine 36+ Years old  Completed   Hepatitis C Screening  Completed   HPV VACCINES  Aged Out   COLONOSCOPY (Pts 45-20yrs Insurance coverage will need to be confirmed)  Discontinued   COVID-19 Vaccine  Discontinued    Health Maintenance  Health Maintenance Due  Topic Date Due   Zoster Vaccines- Shingrix (1 of 2) Never done   DEXA SCAN  Never done   DTaP/Tdap/Td (2 - Td or Tdap) 05/01/2022    Colorectal cancer screening: No longer required.   Lung Cancer Screening: (Low Dose CT Chest  recommended if Age 71-80 years, 30 pack-year currently smoking OR have quit w/in 15years.) does not qualify.    Additional Screening:  Hepatitis C Screening: does qualify; Completed 06/15/21  Vision Screening: Recommended annual ophthalmology exams for early detection of glaucoma and other disorders of the eye. Is the patient up to date with their annual eye exam?  Yes  Who is the provider or what is the name of the office in which the patient attends annual eye exams? DR.PORFILIO If pt is not established with a provider, would they like to be referred to a provider to establish care? No .   Dental Screening: Recommended annual dental exams for proper oral hygiene  Community Resource Referral / Chronic Care Management: CRR required this visit?  No   CCM required this visit?  No      Plan:     I have personally reviewed and noted the following in the patient's chart:   Medical and social history Use of alcohol, tobacco or illicit drugs  Current medications and supplements including opioid prescriptions. Patient is not currently taking opioid prescriptions. Functional ability and status Nutritional status Physical activity Advanced directives List of other physicians Hospitalizations, surgeries, and ER visits in previous 12 months Vitals Screenings to include cognitive, depression, and falls Referrals and appointments  In addition, I have reviewed and discussed with patient certain preventive protocols, quality metrics, and best practice recommendations. A written personalized care plan for preventive services as well as general preventive health recommendations were provided to patient.     Hal Hope, LPN   [id number]   Nurse Notes: [facility]

## 2022-08-22 ENCOUNTER — Other Ambulatory Visit: Payer: Self-pay | Admitting: Family Medicine

## 2022-08-22 DIAGNOSIS — E034 Atrophy of thyroid (acquired): Secondary | ICD-10-CM

## 2022-08-22 NOTE — Telephone Encounter (Signed)
Rx 08/07/22 #90 3RF- too soon Requested Prescriptions  Pending Prescriptions Disp Refills   levothyroxine (SYNTHROID) 88 MCG tablet [Pharmacy Med Name: Levothyroxine Sodium 88 MCG Oral Tablet] 90 tablet 0    Sig: TAKE 1 TABLET BY MOUTH ONCE DAILY BEFORE BREAKFAST     Endocrinology:  Hypothyroid Agents Passed - 08/22/2022 12:49 PM      Passed - TSH in normal range and within 360 days    TSH  Date Value Ref Range Status  08/07/2022 3.36 0.40 - 4.50 mIU/L Final         Passed - Valid encounter within last 12 months    Recent Outpatient Visits           2 weeks ago Type 2 diabetes mellitus with peripheral neuropathy   [place] [facility] Delles, Jackelyn Poling, RPH-CPP   2 weeks ago Annual physical exam   [place] [facility] [place], Netta Neat, DO   1 month ago Acute bilateral low back pain without sciatica   [place] [facility] Mecum, Oswaldo [place], [place]   1 month ago Centrilobular emphysema [facility])   [place] [facility] Delles, Gentry Fitz A, RPH-CPP   2 months ago Centrilobular emphysema [facility])   [place] [facility] Delles, Jackelyn Poling, RPH-CPP       Future Appointments             In 5 months Althea Charon, Netta Neat, DO [place] [facility], [facility]

## 2022-09-02 ENCOUNTER — Other Ambulatory Visit: Payer: Self-pay | Admitting: Cardiovascular Disease

## 2022-09-06 ENCOUNTER — Telehealth: Payer: Self-pay | Admitting: Family Medicine

## 2022-09-06 NOTE — Telephone Encounter (Signed)
Spoke with pharmacy and they were able to get issue handled.

## 2022-09-06 NOTE — Telephone Encounter (Signed)
Philip [name],called from [facility], they don't have the brand of Levothyroxine med for patient, she is asking can they switch the manufacturer.

## 2022-09-06 NOTE — Telephone Encounter (Signed)
Yes that is fine. Okay to switch  Saralyn Pilar, DO [facility] 09/06/2022, 12:57 PM

## 2022-09-18 ENCOUNTER — Ambulatory Visit: Payer: Medicare Other | Admitting: Pharmacist

## 2022-09-18 DIAGNOSIS — E1142 Type 2 diabetes mellitus with diabetic polyneuropathy: Secondary | ICD-10-CM

## 2022-09-18 DIAGNOSIS — I129 Hypertensive chronic kidney disease with stage 1 through stage 4 chronic kidney disease, or unspecified chronic kidney disease: Secondary | ICD-10-CM

## 2022-09-18 DIAGNOSIS — J432 Centrilobular emphysema: Secondary | ICD-10-CM

## 2022-09-18 NOTE — Progress Notes (Signed)
09/18/2022 Name: Philip BEAUDET Sr. MRN: [id number] DOB: Nov 30, 1945  Chief Complaint  Patient presents with   Medication Management   Medication Assistance    Philip MUJICA Sr. is a 77 y.o. year old adult who presented for a telephone visit.   They were referred to the pharmacist by their PCP for assistance in managing diabetes, hypertension, hyperlipidemia, and medication access.   Subjective:  Care Team: Primary Care Provider: Smitty Cords, DO; Next Scheduled Visit: 02/06/2023 Cardiologist: [name], MD Pulmonologist: Mertie Moores, MD; Next Scheduled Visit: 02/15/2023 Orthopedics: Francisco Capuchin, MD; Next Scheduled Visit: 03/08/2023  Medication Access/Adherence  Current Pharmacy:  [facility] [address]), [place] - [address] [place]) [place] [address] Phone: [phone] Fax: [phone]   Patient reports affordability concerns with their medications: No  Patient reports access/transportation concerns to their pharmacy: No  Patient reports adherence concerns with their medications:  No     Patient uses weekly pillbox   Type 2 Diabetes:   Current medications: metformin 500 mg - 2 tablets (1,000 mg) twice daily as directed Farxiga 10 mg - 1 tablet QAM     Current glucose readings:  Fasting Blood Glucose After Supper/Betime  14 - May 117 158  15 - May 122 143  16 - May 154 286  17 - May 135 135  18 - May 139 193  19 - May 138 136  20 - May 138     Reports still drinking sweet tea some, but has reduced consumption   Denies symptoms of hypoglycemia   Current Physical Activity: walking ~1-2 miles/day   Current medication access support: enrolled in patient assistance for Farxiga from [employer] through 05/01/2023    CHF/Hypertension:   Current medications:  Losartan 25 mg - 1 tablet daily Metoprolol 75 mg twice daily Spironolactone 25 mg - 1/2 tablet (12.5 mg) daily   Farxiga 10 mg - 1 tablets daily Furosemide 40 mg daily as needed for edema as directed by Cardiologist   Patient has an automated, upper arm home BP cuff Current blood pressure readings readings:  - Today: 133/85, HR 72 - Yesterday: AM: 126/84, HR 72; PM 133/80, HR 68   Denies symptoms of hypotension such as lightheadedness or dizziness   Confirms monitoring home weight daily as directed by Cardiology  Current Physical Activity: walking ~1-2 miles/day     COPD/seasonal allergies: Patient follows with [facility] Meredeth Ide at [facility]   Current medications: Breztri 2 puffs twice daily Montelukast 10 mg QHS Albuterol inhaler as needed Cetirizine 10 mg daily Flonase nasal spray as needed   Today reports breathing continues to be significantly improved since started using Breztri inhaler.    Confirms using consistently as directed and rinsing mouth out after each use   Current medication access support: Enrolled in Breztri patient assistance program through [employer] through 05/01/2023   Objective:  Lab Results  Component Value Date   HGBA1C 7.2 (H) 08/07/2022    Lab Results  Component Value Date   CREATININE 0.99 08/07/2022   BUN 18 08/07/2022   NA 144 08/07/2022   K 4.6 08/07/2022   CL 106 08/07/2022   CO2 30 08/07/2022    Lab Results  Component Value Date   CHOL 120 08/07/2022   HDL 56 08/07/2022   LDLCALC 48 08/07/2022   TRIG 75 08/07/2022   CHOLHDL 2.1 08/07/2022   BP Readings from Last 3 Encounters:  08/18/22 98/60  08/17/22 104/68  08/07/22 100/60   Pulse Readings from Last 3 Encounters:  08/17/22 75  08/07/22 (!) 57  07/14/22 82     Medications Reviewed Today     Reviewed by Manuela Neptune, RPH-CPP (Pharmacist) on 09/18/22 at 0950  Med List Status: <None>   Medication Order Taking? Sig Documenting Provider Last Dose Status Informant  acetaminophen (TYLENOL) 500 MG tablet [id number]  Take 1,000 mg by mouth every 8 (eight) hours  as needed for moderate pain or mild pain. [provider]  Active Self  Albuterol Sulfate 108 (90 Base) MCG/ACT AEPB [id number]  Inhale 2 puffs into the lungs every 6 (six) hours as needed (shortness of breath).  [provider]  Active Self  apixaban (ELIQUIS) 5 MG TABS tablet [id number]  Take 1 tablet (5 mg total) by mouth 2 (two) times daily. [name], MD  Active   ARTIFICIAL TEAR SOLUTION OP [id number]  Place 1 drop into both eyes daily. [provider]  Active Self  atorvastatin (LIPITOR) 40 MG tablet [id number]  Take 1 tablet by mouth once daily [name], MD  Active   Blood Glucose Monitoring Suppl (ONE TOUCH ULTRA 2) w/Device KIT [id number]  Use to check blood sugar as advised up to twice a day Smitty Cords, DO  Active Self  BREZTRI AEROSPHERE 160-9-4.8 MCG/ACT Sandrea Matte [id number] Yes Inhale 2 puffs into the lungs 2 (two) times daily. Smitty Cords, DO Taking Active   calcium carbonate (TUMS - DOSED IN MG ELEMENTAL CALCIUM) 500 MG chewable tablet [id number]  Chew 1,000 mg by mouth daily as needed for indigestion or heartburn. [provider]  Active Self  cetirizine (ZYRTEC) 10 MG tablet [id number]  Take 10 mg by mouth daily. [provider]  Active Self  cyanocobalamin (VITAMIN B12) 1000 MCG tablet [id number]  Take 1,000 mcg by mouth daily. [provider]  Active   FARXIGA 10 MG TABS tablet [id number] Yes Take 1 tablet (10 mg total) by mouth daily before breakfast. Smitty Cords, DO Taking Active Self  fluticasone (FLONASE) 50 MCG/ACT nasal spray [id number]  Place 2 sprays into both nostrils daily. [name], Netta Neat, DO  Active   furosemide (LASIX) 40 MG tablet [id number] Yes Take 0.5-1 tablets (20-40 mg total) by mouth daily as needed for edema. Only as needed now. Smitty Cords, DO Taking Active Self  gabapentin (NEURONTIN) 100 MG capsule [id number]  TAKE 1 CAPSULE BY MOUTH THREE TIMES  DAILY [name], Netta Neat, DO  Active   ipratropium-albuterol (DUONEB) 0.5-2.5 (3) MG/3ML SOLN [id number]  Take 3 mLs by nebulization every 6 (six) hours as needed (shortness of breath). [provider]  Active Self           Med Note Joella Prince A   Fri Dec 24, 2020  4:04 PM)    Tollie Eth THIN MISC [id number]  1 Device by Does not apply route 4 (four) times daily -  before meals and at bedtime. Delight Ovens, MD  Active Self  levothyroxine (SYNTHROID) 88 MCG tablet [id number]  Take 1 tablet (88 mcg total) by mouth daily before breakfast. Smitty Cords, DO  Active   losartan (COZAAR) 25 MG tablet [id number] Yes TAKE 1 TABLET BY MOUTH ONCE DAILY IN THE MORNING [name], MD Taking Active   metFORMIN (GLUCOPHAGE) 500 MG tablet [id number] Yes Take 2 tablets (1,000 mg total) by mouth 2 (two) times daily with a meal. Smitty Cords, DO Taking Active  methocarbamol (ROBAXIN-750) 750 MG tablet [id number]  Take 1 tablet (750 mg total) by mouth every 8 (eight) hours as needed for muscle spasms.  Patient not taking: Reported on 08/18/2022   Mecum, Oswaldo Conroy, [name]  Active   metoprolol tartrate (LOPRESSOR) 50 MG tablet [id number] Yes TAKE 1 & 1/2 (ONE & ONE-HALF) TABLETS BY MOUTH TWICE DAILY Arida, Chelsea Aus, MD Taking Active   montelukast (SINGULAIR) 10 MG tablet [id number] Yes Take 1 tablet by mouth at bedtime. [provider]  Active   Koren Bound test strip [id number]  USE TO CHECK BLOOD SUGAR TWICE DAILY Althea Charon, Netta Neat, DO  Active   spironolactone (ALDACTONE) 25 MG tablet [id number] Yes Take 1/2 (one-half) tablet by mouth once daily [name], MD Taking Active               Assessment/Plan:   COPD/Seasonal Allergies: - Reviewed appropriate inhaler technique - Patient to follow up with [employer] assistance program as needed regarding refills of Breztri inhaler   Diabetes: - Counseled on importance of continuing to control  carbohydrate portion sizes and using blood sugar checks to reinforce dietary choices - Encourage patient to limit sweets and sugary beverages (sweet tea, Pepsi) - Recommend to continue to check glucose, keep log of results and to bring this record to medical appointments     CHF/Hypertension: - Encourage patient to continue to weigh daily and and follow directions from Cardiologist for use of PRN furosemide (Lasix).  - Recommended to continue to check home blood pressure and heart rate, keep log of results and contact Cardiologist for BP and HR if needed for readings outside of established parameters or symptoms      Follow Up Plan: Clinical Pharmacist will follow up with patient by telephone on 11/24/2022 at 9:15 AM   [name], PharmD, Patsy [place], CPP Clinical Pharmacist [facility] [phone]

## 2022-09-18 NOTE — Patient Instructions (Signed)
Goals Addressed             This Visit's Progress    Pharmacy Goals       Our goal A1c is less than 7%. This corresponds with fasting sugars less than 130 and 2 hour after meal sugars less than 180. Please check your blood sugar and keep log of results  Please continue to monitor your home weight, blood pressure and heart rate  Our goal bad cholesterol, or LDL, is less than 70 . This is why it is important to continue taking your atorvastatin  Feel free to call me with any questions or concerns. I look forward to our next call!    Raquon Milledge Vasco Chong, PharmD, BCACP Clinical Pharmacist [facility] [place] [phone]         

## 2022-10-16 ENCOUNTER — Encounter: Payer: Self-pay | Admitting: Internal Medicine

## 2022-10-16 ENCOUNTER — Ambulatory Visit (INDEPENDENT_AMBULATORY_CARE_PROVIDER_SITE_OTHER): Payer: Medicare Other | Admitting: Internal Medicine

## 2022-10-16 VITALS — BP 122/72 | HR 85 | Temp 96.6°F | Wt 200.0 lb

## 2022-10-16 DIAGNOSIS — J441 Chronic obstructive pulmonary disease with (acute) exacerbation: Secondary | ICD-10-CM

## 2022-10-16 MED ORDER — PREDNISONE 10 MG PO TABS: ORAL_TABLET | ORAL | 0 refills | Status: DC

## 2022-10-16 MED ORDER — AZITHROMYCIN 250 MG PO TABS: ORAL_TABLET | ORAL | 0 refills | Status: DC

## 2022-10-16 NOTE — Progress Notes (Signed)
Subjective:    Patient ID: Philip Snow Sr., adult    DOB: 1945/09/03, 77 y.o.   MRN: [id number]  HPI  Patient presents to the clinic today with complaint of nasal congestion, cough, chest congestion and shortness of breath. This started 1-2 weeks ago.  He is blowing yellow mucous out of his nose. The cough is productive of yellow/green mucous. He denies headaches, runny nose, ear pain, sore throat, chest pain, nausea, vomiting or diarrhea. He denies fever, chills or body aches. He has not noticed any swelling in his legs, takes furosemide and spironolactone as needed. He takes zyrtec, flonase, breztri, and albuterol as prescribed. He does not smoke. He has a history of allergies, CHF and COPD. He has not had sick contacts that he is aware of.   Review of Systems     Past Medical History:  Diagnosis Date   (HFpEF) heart failure with preserved ejection fraction (HCC)    a. 06/2015 Echo: EF 50-55%, Gr1 DD. Mild MR. Mildly dil LA. Nl RV fxn. Nl PASP.   Arthritis    a. right knee   Asthma    Atrial tachycardia    Chronic anticoagulation    Apixaban   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 etiology.   COPD (chronic obstructive pulmonary disease) (HCC)    Coronary artery disease    a. 05/2012 Cath: severe 3VD-->CABG x 3 by Dr. Tyrone Sage in 01/07/2014Krista Blue, SVG->LCX & SVG->RPDA; b. 06/2014 MV: no ischemia/infarct.   DDD (degenerative disc disease), lumbar    Dyspnea    Edema    LEGS/ FEET   Essential hypertension    History of Helicobacter pylori infection    HNP (herniated nucleus pulposus), lumbar    HOH (hard of hearing)    Hypercholesteremia    Hypothyroidism    Low back pain radiating to lower extremity    Lumbar radiculitis    Lumbar stenosis with neurogenic claudication    Neuropathy    PAF (paroxysmal atrial fibrillation) (HCC)    a. CHA2DS2VASc = 5 (age, CHF, HTN, vascular disase, T2DM) --> Eliquis.   Sepsis (HCC) 2021   Shingles    Sleep apnea    NO CPAP    Type II diabetes mellitus (HCC)     Current Outpatient Medications  Medication Sig Dispense Refill   acetaminophen (TYLENOL) 500 MG tablet Take 1,000 mg by mouth every 8 (eight) hours as needed for moderate pain or mild pain.     Albuterol Sulfate 108 (90 Base) MCG/ACT AEPB Inhale 2 puffs into the lungs every 6 (six) hours as needed (shortness of breath).      apixaban (ELIQUIS) 5 MG TABS tablet Take 1 tablet (5 mg total) by mouth 2 (two) times daily. 60 tablet 5   ARTIFICIAL TEAR SOLUTION OP Place 1 drop into both eyes daily.     atorvastatin (LIPITOR) 40 MG tablet Take 1 tablet by mouth once daily 90 tablet 3   Blood Glucose Monitoring Suppl (ONE TOUCH ULTRA 2) w/Device KIT Use to check blood sugar as advised up to twice a day 1 kit 0   BREZTRI AEROSPHERE 160-9-4.8 MCG/ACT AERO Inhale 2 puffs into the lungs 2 (two) times daily. 10.7 g 11   calcium carbonate (TUMS - DOSED IN MG ELEMENTAL CALCIUM) 500 MG chewable tablet Chew 1,000 mg by mouth daily as needed for indigestion or heartburn.     cetirizine (ZYRTEC) 10 MG tablet Take 10 mg by mouth daily.  cyanocobalamin (VITAMIN B12) 1000 MCG tablet Take 1,000 mcg by mouth daily.     FARXIGA 10 MG TABS tablet Take 1 tablet (10 mg total) by mouth daily before breakfast. 90 tablet 3   fluticasone (FLONASE) 50 MCG/ACT nasal spray Place 2 sprays into both nostrils daily. 16 g 3   furosemide (LASIX) 40 MG tablet Take 0.5-1 tablets (20-40 mg total) by mouth daily as needed for edema. Only as needed now. 45 tablet 3   gabapentin (NEURONTIN) 100 MG capsule TAKE 1 CAPSULE BY MOUTH THREE TIMES DAILY 270 capsule 1   ipratropium-albuterol (DUONEB) 0.5-2.5 (3) MG/3ML SOLN Take 3 mLs by nebulization every 6 (six) hours as needed (shortness of breath).     LANCETS ULTRA THIN MISC 1 Device by Does not apply route 4 (four) times daily -  before meals and at bedtime. 200 each 5   levothyroxine (SYNTHROID) 88 MCG tablet Take 1 tablet (88 mcg total) by mouth daily  before breakfast. 90 tablet 3   losartan (COZAAR) 25 MG tablet TAKE 1 TABLET BY MOUTH ONCE DAILY IN THE MORNING 90 tablet 1   metFORMIN (GLUCOPHAGE) 500 MG tablet Take 2 tablets (1,000 mg total) by mouth 2 (two) times daily with a meal. 360 tablet 3   methocarbamol (ROBAXIN-750) 750 MG tablet Take 1 tablet (750 mg total) by mouth every 8 (eight) hours as needed for muscle spasms. (Patient not taking: Reported on 08/18/2022) 20 tablet 0   metoprolol tartrate (LOPRESSOR) 50 MG tablet TAKE 1 & 1/2 (ONE & ONE-HALF) TABLETS BY MOUTH TWICE DAILY 270 tablet 2   montelukast (SINGULAIR) 10 MG tablet Take 1 tablet by mouth at bedtime.     ONETOUCH ULTRA test strip USE TO CHECK BLOOD SUGAR TWICE DAILY 200 each 6   spironolactone (ALDACTONE) 25 MG tablet Take 1/2 (one-half) tablet by mouth once daily 45 tablet 1   No current facility-administered medications for this visit.    Allergies  Allergen Reactions   Bydureon [Exenatide] Anaphylaxis    Possible from bydureon, not 100% confirmed   Clindamycin/Lincomycin Hives and Itching   Penicillins Hives, Itching, Rash and Other (See Comments)    Has patient had a PCN reaction causing immediate rash, facial/tongue/throat swelling, SOB or lightheadedness with hypotension: Yes Has patient had a PCN reaction causing severe rash involving mucus membranes or skin necrosis: No Has patient had a PCN reaction that required hospitalization: No Has patient had a PCN reaction occurring within the last 10 years: No If all of the above answers are "NO", then may proceed with Cephalosporin use.   Sulfa Antibiotics Shortness Of Breath   Tiotropium Shortness Of Breath and Swelling   Penicillin G Hives    Joint stiffness   Spiriferis Other (See Comments)    Affected breathing   Spiriva Handihaler [Tiotropium Bromide Monohydrate]     Affected breathing   Iodinated Contrast Media Rash and Other (See Comments)    Redness Redness   Other Rash    Stress test dye     Family History  Problem Relation Age of Onset   Heart disease Mother    Clotting disorder Mother    Hypertension Sister     Social History   Socioeconomic History   Marital status: Married    Spouse name: Clide Dales   Number of children: 4   Years of education: Not on file   Highest education level: Some college, no degree  Occupational History   Occupation: retired  Tobacco Use  Smoking status: Former    Packs/day: 1.00    Years: 8.00    Additional pack years: 0.00    Total pack years: 8.00    Types: Cigarettes    Quit date: 12/31/1967    Years since quitting: 54.8   Smokeless tobacco: Former  Building services engineer Use: Never used  Substance and Sexual Activity   Alcohol use: No    Alcohol/week: 0.0 standard drinks of alcohol    Comment: 05/06/2012 "last alcohol several years ago; never had problem wit"   Drug use: No   Sexual activity: Not Currently  Other Topics Concern   Not on file  Social History Narrative   ** Merged History Encounter **          Working part time - not currently working because of covid-19    Social Determinants of Corporate investment banker Strain: Low Risk  (08/18/2022)   Overall Financial Resource Strain (CARDIA)    Difficulty of Paying Living Expenses: Not hard at all  Food Insecurity: No Food Insecurity (08/18/2022)   Hunger Vital Sign    Worried About Running Out of Food in the Last Year: Never true    Ran Out of Food in the Last Year: Never true  Transportation Needs: No Transportation Needs (08/18/2022)   PRAPARE - Administrator, Civil Service (Medical): No    Lack of Transportation (Non-Medical): No  Physical Activity: Insufficiently Active (08/18/2022)   Exercise Vital Sign    Days of Exercise per Week: 3 days    Minutes of Exercise per Session: 30 min  Stress: No Stress Concern Present (08/18/2022)   [employer] of Occupational Health - Occupational Stress Questionnaire    Feeling of Stress : Not at all   Social Connections: Moderately Integrated (08/18/2022)   Social Connection and Isolation Panel [NHANES]    Frequency of Communication with Friends and Family: More than three times a week    Frequency of Social Gatherings with Friends and Family: Twice a week    Attends Religious Services: More than 4 times per year    Active Member of [employer] or Organizations: No    Attends Banker Meetings: Never    Marital Status: Married  Catering manager Violence: Not At Risk (08/18/2022)   Humiliation, Afraid, Rape, and Kick questionnaire    Fear of Current or Ex-Partner: No    Emotionally Abused: No    Physically Abused: No    Sexually Abused: No     Constitutional: Denies fever, malaise, fatigue, headache or abrupt weight changes.  HEENT: Patient reports nasal congestion.  Denies eye pain, eye redness, ear pain, ringing in the ears, wax buildup, runny nose, bloody nose, or sore throat. Respiratory: Patient reports cough and shortness of breath.  Denies difficulty breathing.   Cardiovascular: Denies chest pain, chest tightness, palpitations or swelling in the hands or feet.  Gastrointestinal: Denies abdominal pain, bloating, constipation, diarrhea or blood in the stool.   No other specific complaints in a complete review of systems (except as listed in HPI above).  Objective:   Physical Exam   BP 122/72 (BP Location: Left Arm, Patient Position: Sitting, Cuff Size: Normal)   Pulse 85   Temp (!) 96.6 F (35.9 C) (Temporal)   Wt 200 lb (90.7 kg)   SpO2 96%   BMI 27.89 kg/m  Wt Readings from Last 3 Encounters:  08/18/22 196 lb 9.6 oz (89.2 kg)  08/17/22 197 lb 12.8 oz (89.7  kg)  08/07/22 195 lb 12.8 oz (88.8 kg)    General: Appears his stated age, overweight in NAD. HEENT: Head: normal shape and size, no sinus tenderness noted; Eyes: sclera white, no icterus, conjunctiva pink, PERRLA and EOMs intact; Nose: mucosa boggy and moist, septum midline; Throat/Mouth: Teeth present,  mucosa erythema this and moist, + PND, no exudate, lesions or ulcerations noted.  Neck: No adenopathy noted. Cardiovascular: Normal rate and rhythm. No BLE noted. Pulmonary/Chest: Normal effort and positive vesicular breath sounds with scattered rhonchi and bilateral expiratory wheezing. No respiratory distress.  Musculoskeletal: No difficulty with gait.  Neurological: Alert and oriented.    BMET    Component Value Date/Time   NA 144 08/07/2022 0932   NA 141 05/31/2020 0903   K 4.6 08/07/2022 0932   CL 106 08/07/2022 0932   CO2 30 08/07/2022 0932   GLUCOSE 118 (H) 08/07/2022 0932   BUN 18 08/07/2022 0932   BUN 24 05/31/2020 0903   CREATININE 0.99 08/07/2022 0932   CALCIUM 9.1 08/07/2022 0932   GFRNONAA >60 03/03/2022 1552   GFRNONAA 74 06/24/2018 0846   GFRAA 67 05/31/2020 0903   GFRAA 85 06/24/2018 0846    Lipid Panel     Component Value Date/Time   CHOL 120 08/07/2022 0932   CHOL 139 12/14/2014 0834   TRIG 75 08/07/2022 0932   HDL 56 08/07/2022 0932   HDL 43 12/14/2014 0834   CHOLHDL 2.1 08/07/2022 0932   VLDL 20 11/21/2019 0915   LDLCALC 48 08/07/2022 0932    CBC    Component Value Date/Time   WBC 6.2 08/07/2022 0932   RBC 4.82 08/07/2022 0932   HGB 14.2 08/07/2022 0932   HGB 15.2 05/31/2020 0903   HCT 43.0 08/07/2022 0932   HCT 45.2 05/31/2020 0903   PLT 246 08/07/2022 0932   PLT 206 05/31/2020 0903   MCV 89.2 08/07/2022 0932   MCV 87 05/31/2020 0903   MCH 29.5 08/07/2022 0932   MCHC 33.0 08/07/2022 0932   RDW 14.6 08/07/2022 0932   RDW 12.9 05/31/2020 0903   LYMPHSABS 1,178 08/07/2022 0932   LYMPHSABS 1.0 05/31/2020 0903   MONOABS 1.6 (H) 06/27/2019 0508   EOSABS 229 08/07/2022 0932   EOSABS 0.0 05/31/2020 0903   BASOSABS 81 08/07/2022 0932   BASOSABS 0.0 05/31/2020 0903    Hgb A1C Lab Results  Component Value Date   HGBA1C 7.2 (H) 08/07/2022           Assessment & Plan:   COPD Exacerbation:  Encourage rest and fluids Can use a  cough suppressant such as diabetic [place] OTC Rx for Pred taper x 6 days-monitor sugars Rx for azithromycin 250 mg x 5 days Continue Breztri, albuterol, Zyrtec and Flonase  Follow-up with your PCP as previously scheduled. Nicki Reaper, NP

## 2022-10-16 NOTE — Patient Instructions (Signed)
COPD Exacerbation This video will teach you what types of triggers can make COPD worse, and how to avoid them. To view the content, go to this web address: [email or web address]  This video will expire on: 06/28/2024. If you need access to this video following this date, please reach out to the healthcare provider who assigned it to you. This information is not intended to replace advice given to you by your health care provider. Make sure you discuss any questions you have with your health care provider. Elsevier Patient Education  2024 [employer].

## 2022-10-18 ENCOUNTER — Other Ambulatory Visit: Payer: Self-pay | Admitting: Family Medicine

## 2022-10-18 DIAGNOSIS — I5020 Unspecified systolic (congestive) heart failure: Secondary | ICD-10-CM

## 2022-10-18 DIAGNOSIS — E1142 Type 2 diabetes mellitus with diabetic polyneuropathy: Secondary | ICD-10-CM

## 2022-10-18 NOTE — Telephone Encounter (Signed)
Requested Prescriptions  Pending Prescriptions Disp Refills   gabapentin (NEURONTIN) 100 MG capsule [Pharmacy Med Name: Gabapentin 100 MG Oral Capsule] 270 capsule 0    Sig: TAKE 1 CAPSULE BY MOUTH THREE TIMES DAILY     Neurology: Anticonvulsants - gabapentin Passed - 10/18/2022 10:05 AM      Passed - Cr in normal range and within 360 days    Creat  Date Value Ref Range Status  08/07/2022 0.99 0.70 - 1.28 mg/dL Final   Creatinine, Urine  Date Value Ref Range Status  08/07/2022 84 20 - 320 mg/dL Final         Passed - Completed PHQ-2 or PHQ-9 in the last 360 days      Passed - Valid encounter within last 12 months    Recent Outpatient Visits           2 days ago COPD exacerbation [facility])   [place] [facility] [place], [place] W, NP   1 month ago Type 2 diabetes mellitus with peripheral neuropathy [facility])   [place] [facility] Delles, Gentry Fitz A, RPH-CPP   2 months ago Type 2 diabetes mellitus with peripheral neuropathy [facility])   [place] [facility] Delles, Jackelyn Poling, RPH-CPP   2 months ago Annual physical exam   [place] [facility] Se[place], Netta Neat, DO   3 months ago Acute bilateral low back pain without sciatica   [facility] Mecum, Oswaldo Conroy, [place]       Future Appointments             In 3 months Althea Charon, Netta Neat, DO [place] [facility], [facility]             furosemide (LASIX) 40 MG tablet [Pharmacy Med Name: Furosemide 40 MG Oral Tablet] 90 tablet 0    Sig: TAKE 1/2 TO 1 (ONE-HALF TO ONE) TABLET BY MOUTH ONCE DAILY AS NEEDED FOR EDEMA. ONLY AS NEEDED.     Cardiovascular:  Diuretics - Loop Failed - 10/18/2022 10:05 AM      Failed - Mg Level in normal range and within 180 days    Magnesium  Date Value Ref Range Status  06/30/2019 2.3 1.7 - 2.4 mg/dL Final    Comment:    Performed at [facility], [address]  [place], [place] [address]         Passed - K in normal range and within 180 days    Potassium  Date Value Ref Range Status  08/07/2022 4.6 3.5 - 5.3 mmol/L Final         Passed - Ca in normal range and within 180 days    Calcium  Date Value Ref Range Status  08/07/2022 9.1 8.6 - 10.3 mg/dL Final   Calcium, Ion  Date Value Ref Range Status  05/08/2012 1.17 1.13 - 1.30 mmol/L Final         Passed - Na in normal range and within 180 days    Sodium  Date Value Ref Range Status  08/07/2022 144 135 - 146 mmol/L Final  05/31/2020 141 134 - 144 mmol/L Final         Passed - Cr in normal range and within 180 days    Creat  Date Value Ref Range Status  08/07/2022 0.99 0.70 - 1.28 mg/dL Final   Creatinine, Urine  Date Value Ref Range Status  08/07/2022 84 20 - 320  mg/dL Final         Passed - Cl in normal range and within 180 days    Chloride  Date Value Ref Range Status  08/07/2022 106 98 - 110 mmol/L Final         Passed - Last BP in normal range    BP Readings from Last 1 Encounters:  10/16/22 122/72         Passed - Valid encounter within last 6 months    Recent Outpatient Visits           2 days ago COPD exacerbation [facility])   [place] [facility] [place], [place] W, NP   1 month ago Type 2 diabetes mellitus with peripheral neuropathy [facility])   [place] [facility] Delles, Gentry Fitz A, RPH-CPP   2 months ago Type 2 diabetes mellitus with peripheral neuropathy [facility])   [place] [facility] Delles, Jackelyn Poling, RPH-CPP   2 months ago Annual physical exam   [place] [facility] [place], Netta Neat, DO   3 months ago Acute bilateral low back pain without sciatica   [place] [facility] Mecum, Oswaldo Conroy, [place]       Future Appointments             In 3 months Althea Charon, Netta Neat, DO [place] [facility], [facility]

## 2022-11-06 ENCOUNTER — Other Ambulatory Visit: Payer: Self-pay | Admitting: Family Medicine

## 2022-11-06 DIAGNOSIS — E1142 Type 2 diabetes mellitus with diabetic polyneuropathy: Secondary | ICD-10-CM

## 2022-11-07 NOTE — Telephone Encounter (Signed)
Requested by interface surescripts. Future visit in 3 months .  Requested Prescriptions  Pending Prescriptions Disp Refills   metFORMIN (GLUCOPHAGE) 500 MG tablet [Pharmacy Med Name: metFORMIN HCl 500 MG Oral Tablet] 360 tablet 0    Sig: TAKE 2 TABLETS BY MOUTH TWICE DAILY WITH A MEAL     Endocrinology:  Diabetes - Biguanides Passed - 11/06/2022  5:04 PM      Passed - Cr in normal range and within 360 days    Creat  Date Value Ref Range Status  08/07/2022 0.99 0.70 - 1.28 mg/dL Final   Creatinine, Urine  Date Value Ref Range Status  08/07/2022 84 20 - 320 mg/dL Final         Passed - HBA1C is between 0 and 7.9 and within 180 days    Hemoglobin A1C  Date Value Ref Range Status  06/01/2020 8.7  Final    Comment:    [facility] Nurse   Hgb A1c MFr Bld  Date Value Ref Range Status  08/07/2022 7.2 (H) <5.7 % of total Hgb Final    Comment:    For someone without known diabetes, a hemoglobin A1c value of 6.5% or greater indicates that they may have  diabetes and this should be confirmed with a follow-up  test. . For someone with known diabetes, a value <7% indicates  that their diabetes is well controlled and a value  greater than or equal to 7% indicates suboptimal  control. A1c targets should be individualized based on  duration of diabetes, age, comorbid conditions, and  other considerations. . Currently, no consensus exists regarding use of hemoglobin A1c for diagnosis of diabetes for children. .          Passed - eGFR in normal range and within 360 days    GFR, Est African American  Date Value Ref Range Status  06/24/2018 85 > OR = 60 mL/min/1.60m2 Final   GFR calc Af Amer  Date Value Ref Range Status  05/31/2020 67 >59 mL/min/1.73 Final    Comment:    **In accordance with recommendations from the NKF-ASN Task force,**   [facility] is in the process of updating its eGFR calculation to the   2021 CKD-EPI creatinine equation that estimates kidney function   without a race  variable.    GFR, Est Non African American  Date Value Ref Range Status  06/24/2018 74 > OR = 60 mL/min/1.23m2 Final   GFR, Estimated  Date Value Ref Range Status  03/03/2022 >60 >60 mL/min Final    Comment:    (NOTE) Calculated using the CKD-EPI Creatinine Equation (2021)    eGFR  Date Value Ref Range Status  08/07/2022 79 > OR = 60 mL/min/1.82m2 Final         Passed - B12 Level in normal range and within 720 days    Vitamin B-12  Date Value Ref Range Status  07/27/2021 253 200 - 1,100 pg/mL Final    Comment:    . Please Note: Although the reference range for vitamin B12 is [phone] pg/mL, it has been reported that between 5 and 10% of patients with values between 200 and 400 pg/mL may experience neuropsychiatric and hematologic abnormalities due to occult B12 deficiency; less than 1% of patients with values above 400 pg/mL will have symptoms. Verna Czech - Valid encounter within last 6 months    Recent Outpatient Visits  3 weeks ago COPD exacerbation [facility])   [place] [facility] [place], [place], NP   1 month ago Type 2 diabetes mellitus with peripheral neuropathy [facility])   [place] [facility] Delles, Gentry Fitz A, RPH-CPP   3 months ago Type 2 diabetes mellitus with peripheral neuropathy [facility])   [place] [facility] Delles, Gentry Fitz A, RPH-CPP   3 months ago Annual physical exam   [place] [facility] Smitty Cords, DO   3 months ago Acute bilateral low back pain without sciatica   [place] [facility] Mecum, Oswaldo Conroy, [name]       Future Appointments             In 3 months Althea Charon, Netta Neat, DO [place] [facility], [facility]            Passed - CBC within normal limits and completed in the last 12 months    WBC  Date Value Ref Range Status  08/07/2022 6.2 3.8 - 10.8 Thousand/uL Final   RBC  Date Value Ref Range  Status  08/07/2022 4.82 4.20 - 5.80 Million/uL Final   Hemoglobin  Date Value Ref Range Status  08/07/2022 14.2 13.2 - 17.1 g/dL Final  40/98/1191 47.8 13.0 - 17.7 g/dL Final   HCT  Date Value Ref Range Status  08/07/2022 43.0 38.5 - 50.0 % Final   Hematocrit  Date Value Ref Range Status  05/31/2020 45.2 37.5 - 51.0 % Final   MCHC  Date Value Ref Range Status  08/07/2022 33.0 32.0 - 36.0 g/dL Final   [facility]  Date Value Ref Range Status  08/07/2022 29.5 27.0 - 33.0 pg Final   MCV  Date Value Ref Range Status  08/07/2022 89.2 80.0 - 100.0 fL Final  05/31/2020 87 79 - 97 fL Final   No results found for: "PLTCOUNTKUC", "LABPLAT", "POCPLA" RDW  Date Value Ref Range Status  08/07/2022 14.6 11.0 - 15.0 % Final  05/31/2020 12.9 11.6 - 15.4 % Final

## 2022-11-15 ENCOUNTER — Other Ambulatory Visit: Payer: Self-pay | Admitting: Cardiovascular Disease

## 2022-11-24 ENCOUNTER — Ambulatory Visit: Payer: Medicare Other | Admitting: Pharmacist

## 2022-11-24 DIAGNOSIS — I129 Hypertensive chronic kidney disease with stage 1 through stage 4 chronic kidney disease, or unspecified chronic kidney disease: Secondary | ICD-10-CM

## 2022-11-24 DIAGNOSIS — E1142 Type 2 diabetes mellitus with diabetic polyneuropathy: Secondary | ICD-10-CM

## 2022-11-24 NOTE — Progress Notes (Signed)
11/24/2022 Name: AAHIL GILE Sr. MRN: [id number] DOB: [birth year]  Chief Complaint  Patient presents with   Medication Adherence   Medication Assistance   Medication Management    Wylder Trine. is a 77 y.o. year old male who presented for a telephone visit.   They were referred to the pharmacist by their PCP for assistance in managing diabetes, hypertension, hyperlipidemia, and medication access.    Subjective:   Care Team: Primary Care Provider: Smitty Cords, DO; Next Scheduled Visit: 02/06/2023 Cardiologist: [name], MD Pulmonologist: Mertie Moores, MD; Next Scheduled Visit: 02/15/2023 Orthopedics: Francisco Capuchin, MD; Next Scheduled Visit: 03/08/2023  Medication Access/Adherence  Current Pharmacy:  [facility] [address]), [place] - [address]) [place] [address] Phone: [phone] Fax: [phone]   Patient reports affordability concerns with their medications: No  Patient reports access/transportation concerns to their pharmacy: No  Patient reports adherence concerns with their medications:  No     Patient uses weekly pillbox   Type 2 Diabetes:   Current medications: metformin 500 mg - 2 tablets (1,000 mg) twice daily as directed Farxiga 10 mg - 1 tablet QAM     Current glucose readings:   Fasting Blood Glucose After Supper/Bedtime  20 - July 163 132  21 - July 146 168  22 - July 135 -  23 - July 129 143  24 - July 134 137  25 - July 133 169  26 - July 139      Reports recently drinking sweet tea some with supper and a sip at bedtime with his evening medications   Denies symptoms of hypoglycemia     Current medication access support: enrolled in patient assistance for Farxiga from [employer] through 05/01/2023    CHF/Hypertension:   Current medications:  Losartan 25 mg - 1 tablet daily Metoprolol 75 mg twice daily Spironolactone 25 mg - 1/2 tablet  (12.5 mg) daily  Farxiga 10 mg - 1 tablets daily Furosemide 40 mg daily as needed for edema as directed by Cardiologist   Patient has an automated, upper arm home BP cuff Current blood pressure readings readings:  - Yesterday: AM: 119/73, HR 66; PM 124/75, HR 68   Denies symptoms of hypotension such as lightheadedness or dizziness   Confirms monitoring home weight daily as directed by Cardiology. Reports weight has been stable; denies needing furosemide recently      COPD/seasonal allergies: Patient follows with [facility]eredeth Ide at [facility]   Current medications: Markus Daft 2 puffs twice daily Montelukast 10 mg QHS Albuterol inhaler as needed Cetirizine 10 mg daily Flonase nasal spray as needed   Today reports breathing continues to be significantly improved since completed course of both prednisone and azithromycin as prescribed by NP Nicki Reaper on 10/16/2022 related to COPD exacerbation   Confirms using Breztri inhaler consistently as directed and rinsing mouth out after each use   Current medication access support: Enrolled in [place] patient assistance program through [employer] through 05/01/2023   Objective:  Lab Results  Component Value Date   HGBA1C 7.2 (H) 08/07/2022    Lab Results  Component Value Date   CREATININE 0.99 08/07/2022   BUN 18 08/07/2022   NA 144 08/07/2022   K 4.6 08/07/2022   CL 106 08/07/2022   CO2 30 08/07/2022    Lab Results  Component Value Date   CHOL 120 08/07/2022   HDL 56 08/07/2022   LDLCALC  48 08/07/2022   TRIG 75 08/07/2022   CHOLHDL 2.1 08/07/2022   BP Readings from Last 3 Encounters:  10/16/22 122/72  08/18/22 98/60  08/17/22 104/68   Pulse Readings from Last 3 Encounters:  10/16/22 85  08/17/22 75  08/07/22 (!) 57    Medications Reviewed Today     Reviewed by Manuela Neptune, RPH-CPP (Pharmacist) on 11/24/22 at 0945  Med List Status: <None>   Medication Order Taking? Sig Documenting Provider Last  Dose Status Informant  acetaminophen (TYLENOL) 500 MG tablet [id number]  Take 1,000 mg by mouth every 8 (eight) hours as needed for moderate pain or mild pain. [provider]  Active Self  Albuterol Sulfate 108 (90 Base) MCG/ACT AEPB [id number]  Inhale 2 puffs into the lungs every 6 (six) hours as needed (shortness of breath).  [provider]  Active Self  apixaban (ELIQUIS) 5 MG TABS tablet [id number] Yes Take 1 tablet (5 mg total) by mouth 2 (two) times daily. [name], MD Taking Active   ARTIFICIAL TEAR SOLUTION OP [id number]  Place 1 drop into both eyes daily. [provider]  Active Self  atorvastatin (LIPITOR) 40 MG tablet [id number] Yes Take 1 tablet by mouth once daily [name], MD Taking Active   Blood Glucose Monitoring Suppl (ONE TOUCH ULTRA 2) w/Device KIT [id number]  Use to check blood sugar as advised up to twice a day Smitty Cords, DO  Active Self  BREZTRI AEROSPHERE 160-9-4.8 MCG/ACT Sandrea Matte [id number] Yes Inhale 2 puffs into the lungs 2 (two) times daily. Smitty Cords, DO Taking Active   calcium carbonate (TUMS - DOSED IN MG ELEMENTAL CALCIUM) 500 MG chewable tablet [id number]  Chew 1,000 mg by mouth daily as needed for indigestion or heartburn. [provider]  Active Self  cetirizine (ZYRTEC) 10 MG tablet [id number]  Take 10 mg by mouth daily. [provider]  Active Self  cyanocobalamin (VITAMIN B12) 1000 MCG tablet [id number]  Take 1,000 mcg by mouth daily. [provider]  Active   FARXIGA 10 MG TABS tablet [id number] Yes Take 1 tablet (10 mg total) by mouth daily before breakfast. Smitty Cords, DO Taking Active Self  fluticasone (FLONASE) 50 MCG/ACT nasal spray [id number]  Place 2 sprays into both nostrils daily. Smitty Cords, DO  Active   furosemide (LASIX) 40 MG tablet [id number] Yes TAKE 1/2 TO 1 (ONE-HALF TO ONE) TABLET BY MOUTH ONCE DAILY AS NEEDED FOR EDEMA. ONLY AS  NEEDED. Smitty Cords, DO Taking Active   gabapentin (NEURONTIN) 100 MG capsule [id number]  TAKE 1 CAPSULE BY MOUTH THREE TIMES DAILY [name], Netta Neat, DO  Active   ipratropium-albuterol (DUONEB) 0.5-2.5 (3) MG/3ML SOLN [id number]  Take 3 mLs by nebulization every 6 (six) hours as needed (shortness of breath). [provider]  Active Self           Med Note Joella Prince A   Fri Dec 24, 2020  4:04 PM)    Tollie Eth THIN MISC [id number]  1 Device by Does not apply route 4 (four) times daily -  before meals and at bedtime. Delight Ovens, MD  Active Self  levothyroxine (SYNTHROID) 88 MCG tablet [id number] Yes Take 1 tablet (88 mcg total) by mouth daily before breakfast. Smitty Cords, DO Taking Active   losartan (COZAAR) 25 MG tablet [id number] Yes TAKE 1 TABLET BY MOUTH ONCE DAILY IN THE MORNING [name], MD Taking Active   metFORMIN (GLUCOPHAGE) 500  MG tablet [id number] Yes TAKE 2 TABLETS BY MOUTH TWICE DAILY WITH A MEAL [name], Alexander Shela [place], DO Taking Active   metoprolol tartrate (LOPRESSOR) 50 MG tablet [id number] Yes TAKE 1 & 1/2 (ONE & ONE-HALF) TABLETS BY MOUTH TWICE DAILY [name], MD Taking Active   montelukast (SINGULAIR) 10 MG tablet [id number] Yes Take 1 tablet by mouth at bedtime. [provider] Taking Active   [facility] ULTRA test strip [id number]  USE TO CHECK BLOOD SUGAR TWICE DAILY Althea Charon, Netta Neat, DO  Active   spironolactone (ALDACTONE) 25 MG tablet [id number] Yes Take 1/2 (one-half) tablet by mouth once daily [name], MD Taking Active               Assessment/Plan:   COPD/Seasonal Allergies: - Reviewed appropriate inhaler technique - Patient to follow up with [employer] assistance program as needed regarding refills of Breztri inhaler   Diabetes: - Counseled on importance of continuing to control carbohydrate portion sizes and using blood sugar checks to reinforce dietary choices - Encourage  patient to limit sweets and sugary beverages (sweet tea, [employer]epsi) - Recommend to continue to check glucose, keep log of results and to bring this record to medical appointments     CHF/Hypertension: - Encourage patient to continue to weigh daily and and follow directions from Cardiologist for use of PRN furosemide (Lasix).  - Recommended to continue to check home blood pressure and heart rate, keep log of results and contact Cardiologist for BP and HR if needed for readings outside of established parameters or symptoms      Follow Up Plan: Clinical Pharmacist will follow up with patient by telephone on 01/22/2023 at 9:15 am   [name], PharmD, [place]y [place], CPP Clinical Pharmacist [facility] [phone]

## 2022-11-24 NOTE — Patient Instructions (Signed)
Goals Addressed             This Visit's Progress    Pharmacy Goals       Our goal A1c is less than 7%. This corresponds with fasting sugars less than 130 and 2 hour after meal sugars less than 180. Please check your blood sugar and keep log of results  Please continue to monitor your home weight, blood pressure and heart rate  Our goal bad cholesterol, or LDL, is less than 70 . This is why it is important to continue taking your atorvastatin  Feel free to call me with any questions or concerns. I look forward to our next call!    Wallace Cullens, PharmD, [place] [phone]

## 2023-01-17 ENCOUNTER — Other Ambulatory Visit: Payer: Self-pay | Admitting: Family Medicine

## 2023-01-17 ENCOUNTER — Other Ambulatory Visit: Payer: Self-pay | Admitting: Cardiovascular Disease

## 2023-01-17 DIAGNOSIS — E1142 Type 2 diabetes mellitus with diabetic polyneuropathy: Secondary | ICD-10-CM

## 2023-01-18 NOTE — Telephone Encounter (Signed)
Requested Prescriptions  Pending Prescriptions Disp Refills   gabapentin (NEURONTIN) 100 MG capsule [Pharmacy Med Name: Gabapentin 100 MG Oral Capsule] 270 capsule 0    Sig: TAKE 1 CAPSULE BY MOUTH THREE TIMES DAILY     Neurology: Anticonvulsants - gabapentin Passed - 01/17/2023  6:15 PM      Passed - Cr in normal range and within 360 days    Creat  Date Value Ref Range Status  08/07/2022 0.99 0.70 - 1.28 mg/dL Final   Creatinine, Urine  Date Value Ref Range Status  08/07/2022 84 20 - 320 mg/dL Final         Passed - Completed PHQ-2 or PHQ-9 in the last 360 days      Passed - Valid encounter within last 12 months    Recent Outpatient Visits           1 month ago Type 2 diabetes mellitus with peripheral neuropathy [facility])   [place] [facility] Delles, Gentry Fitz A, RPH-CPP   3 months ago COPD exacerbation [facility])   [place] [facility] [place], [place] W, NP   4 months ago Type 2 diabetes mellitus with peripheral neuropathy [facility])   [place] [facility] Delles, Gentry Fitz A, RPH-CPP   5 months ago Type 2 diabetes mellitus with peripheral [facility])   [place] [facility] Delles, Jackelyn Poling, RPH-CPP   5 months ago Annual physical exam   [place] [facility] Althea Charon, Netta Neat, DO       Future Appointments             In 2 weeks Althea Charon, Netta Neat, DO  [facility], [facility]

## 2023-01-22 ENCOUNTER — Ambulatory Visit: Payer: Medicare Other | Admitting: Pharmacist

## 2023-01-22 DIAGNOSIS — E1142 Type 2 diabetes mellitus with diabetic polyneuropathy: Secondary | ICD-10-CM

## 2023-01-22 DIAGNOSIS — J432 Centrilobular emphysema: Secondary | ICD-10-CM

## 2023-01-22 DIAGNOSIS — I129 Hypertensive chronic kidney disease with stage 1 through stage 4 chronic kidney disease, or unspecified chronic kidney disease: Secondary | ICD-10-CM

## 2023-01-22 NOTE — Patient Instructions (Addendum)
Goals Addressed             This Visit's Progress    Pharmacy Goals       Our goal A1c is less than 7%. This corresponds with fasting sugars less than 130 and 2 hour after meal sugars less than 180. Please check your blood sugar and keep log of results  Please continue to monitor your home weight, blood pressure and heart rate  Our goal bad cholesterol, or LDL, is less than 70 . This is why it is important to continue taking your atorvastatin  Feel free to call me with any questions or concerns. I look forward to our next call!    [name], PharmD, [facility] Clinical Pharmacist [facility] [phone]

## 2023-01-22 NOTE — Progress Notes (Signed)
01/22/2023 Name: Philip GUEDES Sr. MRN: [id number] DOB: [birth year]  Chief Complaint  Patient presents with   Medication Management   Medication Assistance    Philip CLOVER Sr. is a 77 y.o. year old male who presented for a telephone visit.   They were referred to the pharmacist by their PCP for assistance in managing diabetes, hypertension, hyperlipidemia, and medication access.    Subjective:   Care Team: Primary Care Provider: Smitty Cords, DO; Next Scheduled Visit: 02/06/2023 Cardiologist: [name], MD Pulmonologist: Mertie Moores, MD; Next Scheduled Visit: 02/15/2023 Orthopedics: Francisco Capuchin, MD; Next Scheduled Visit: 03/08/2023  Medication Access/Adherence  Current Pharmacy:  [facility] [address] ([address]), [place] - [address] [place]) [place] [address] Phone: [phone] Fax: [phone]   Patient reports affordability concerns with their medications: No  Patient reports access/transportation concerns to their pharmacy: No  Patient reports adherence concerns with their medications:  No     Patient uses weekly pillbox   Type 2 Diabetes:   Current medications: metformin 500 mg - 2 tablets (1,000 mg) twice daily as directed Farxiga 10 mg - 1 tablet QAM     Current glucose readings:  Fasting Blood Glucose After Supper/Bedtime  17 - September 129 172  18 - September 118 157  19 - September 138 156  20 - September - -  21 - September 162 -  22 - September 135 203  23 - September 120      Reports using blood sugar readings as feedback on dietary choices   Denies symptoms of hypoglycemia   Current medication access support: enrolled in patient assistance for Farxiga from [employer] through 05/01/2023    CHF/Hypertension:   Current medications:  Losartan 25 mg - 1 tablet daily Metoprolol 75 mg twice daily Spironolactone 25 mg - 1/2 tablet (12.5 mg) daily  Farxiga 10 mg - 1  tablets daily Furosemide 40 mg daily as needed for edema as directed by Cardiologist   Patient has an automated, upper arm home BP cuff Current blood pressure readings readings:  - Today: 110/75, HR 65 - Yesterday: AM:  126/80, HR 55; PM 107/67, HR 79   Denies symptoms of hypotension such as lightheadedness or dizziness   Confirms monitoring home weight daily as directed by Cardiology. Reports weight has been stable; denies needing furosemide recently      COPD/seasonal allergies: Patient follows with Dr. Meredeth Ide at [facility]   Current medications: Markus Daft 2 puffs twice daily Montelukast 10 mg QHS Albuterol inhaler as needed Cetirizine 10 mg daily Flonase nasal spray as needed   Reports recently for the past month having more coughing - reports usually starts as dry cough, but then continues and becomes productive  Confirms using Breztri inhaler consistently as directed and rinsing mouth out after each use   Current medication access support: Enrolled in [employer] patient assistance program through [employer] through 05/01/2023   Objective:  Lab Results  Component Value Date   HGBA1C 7.2 (H) 08/07/2022    Lab Results  Component Value Date   CREATININE 0.99 08/07/2022   BUN 18 08/07/2022   NA 144 08/07/2022   K 4.6 08/07/2022   CL 106 08/07/2022   CO2 30 08/07/2022    Lab Results  Component Value Date   CHOL 120 08/07/2022   HDL 56 08/07/2022   LDLCALC 48 08/07/2022   TRIG 75 08/07/2022   CHOLHDL 2.1 08/07/2022   BP  Readings from Last 3 Encounters:  10/16/22 122/72  08/18/22 98/60  08/17/22 104/68   Pulse Readings from Last 3 Encounters:  10/16/22 85  08/17/22 75  08/07/22 (!) 57     Medications Reviewed Today     Reviewed by Manuela Neptune, RPH-CPP (Pharmacist) on 01/22/23 at 1050  Med List Status: <None>   Medication Order Taking? Sig Documenting Provider Last Dose Status Informant  acetaminophen (TYLENOL) 500 MG tablet  [id number]  Take 1,000 mg by mouth every 8 (eight) hours as needed for moderate pain or mild pain. [provider]  Active Self  Albuterol Sulfate 108 (90 Base) MCG/ACT AEPB [id number]  Inhale 2 puffs into the lungs every 6 (six) hours as needed (shortness of breath).  [provider]  Active Self  apixaban (ELIQUIS) 5 MG TABS tablet [id number]  Take 1 tablet (5 mg total) by mouth 2 (two) times daily. [name], MD  Active   ARTIFICIAL TEAR SOLUTION OP [id number]  Place 1 drop into both eyes daily. [provider]  Active Self  atorvastatin (LIPITOR) 40 MG tablet [id number]  Take 1 tablet by mouth once daily [name], MD  Active   Blood Glucose Monitoring Suppl (ONE TOUCH ULTRA 2) w/Device KIT [id number]  Use to check blood sugar as advised up to twice a day Smitty Cords, DO  Active Self  BREZTRI AEROSPHERE 160-9-4.8 MCG/ACT Sandrea Matte [id number] Yes Inhale 2 puffs into the lungs 2 (two) times daily. Smitty Cords, DO Taking Active   calcium carbonate (TUMS - DOSED IN MG ELEMENTAL CALCIUM) 500 MG chewable tablet [id number]  Chew 1,000 mg by mouth daily as needed for indigestion or heartburn. [provider]  Active Self  cetirizine (ZYRTEC) 10 MG tablet [id number]  Take 10 mg by mouth daily. [provider]  Active Self  cyanocobalamin (VITAMIN B12) 1000 MCG tablet [id number]  Take 1,000 mcg by mouth daily. [provider]  Active   FARXIGA 10 MG TABS tablet [id number] Yes Take 1 tablet (10 mg total) by mouth daily before breakfast. Smitty Cords, DO Taking Active Self  fluticasone (FLONASE) 50 MCG/ACT nasal spray [id number]  Place 2 sprays into both nostrils daily. Smitty Cords, DO  Active   furosemide (LASIX) 40 MG tablet [id number] Yes TAKE 1/2 TO 1 (ONE-HALF TO ONE) TABLET BY MOUTH ONCE DAILY AS NEEDED FOR EDEMA. ONLY AS NEEDED. Smitty Cords, DO Taking Active   gabapentin (NEURONTIN) 100 MG  capsule [id number]  TAKE 1 CAPSULE BY MOUTH THREE TIMES DAILY [name], Netta Neat, DO  Active   ipratropium-albuterol (DUONEB) 0.5-2.5 (3) MG/3ML SOLN [id number]  Take 3 mLs by nebulization every 6 (six) hours as needed (shortness of breath). [provider]  Active Self           Med Note Joella Prince A   Fri Dec 24, 2020  4:04 PM)    Tollie Eth THIN MISC [id number]  1 Device by Does not apply route 4 (four) times daily -  before meals and at bedtime. Delight Ovens, MD  Active Self  levothyroxine (SYNTHROID) 88 MCG tablet [id number]  Take 1 tablet (88 mcg total) by mouth daily before breakfast. Smitty Cords, DO  Active   losartan (COZAAR) 25 MG tablet [id number] Yes TAKE 1 TABLET BY MOUTH ONCE DAILY IN THE MORNING [name], MD Taking Active   metFORMIN (GLUCOPHAGE) 500 MG tablet [id number] Yes TAKE 2 TABLETS BY MOUTH TWICE DAILY WITH A MEAL  Smitty Cords, DO Taking Active   metoprolol tartrate (LOPRESSOR) 50 MG tablet [id number] Yes TAKE 1 & 1/2 (ONE & ONE-HALF) TABLETS BY MOUTH TWICE DAILY Arida, Chelsea Aus, MD Taking Active   montelukast (SINGULAIR) 10 MG tablet [id number]  Take 1 tablet by mouth at bedtime. [provider]  Active   Koren Bound test strip [id number]  USE TO CHECK BLOOD SUGAR TWICE DAILY Smitty Cords, DO  Active   spironolactone (ALDACTONE) 25 MG tablet [id number] Yes Take 1/2 (one-half) tablet by mouth once daily [name], MD Taking Active               Assessment/Plan:   Follow up with [facility] today on behalf of patient. Based on out of pocket expense total for patient and spouse from beginning of current calendar year, patient does not yet meet annual 3% out of pocket expense requirement to apply for Eliquis patient assistance.   COPD/Seasonal Allergies: - Reviewed appropriate inhaler technique - Encourage patient to contact his Pulmonologist today regarding increased coughing for  the past month - Patient to follow up with [employer] assistance program as needed regarding refills of Breztri inhaler   Diabetes: - Counseled on importance of continuing to control carbohydrate portion sizes and using blood sugar checks to reinforce dietary choices - Encourage patient to limit sweets and sugary beverages (sweet tea, Pepsi) - Recommend to continue to check glucose, keep log of results and to bring this record to medical appointments     CHF/Hypertension: - Encourage patient to continue to weigh daily and and follow directions from Cardiologist for use of PRN furosemide (Lasix).  - Recommended to continue to check home blood pressure and heart rate, keep log of results and contact Cardiologist for BP and HR if needed for readings outside of established parameters or symptoms      Follow Up Plan:   Clinical Pharmacist will follow up with patient by telephone on 02/23/2023 at 10:00 AM   Patient to follow continue to up with pharmacy to determine when has spent 3% out of pocket. If occurs prior to our next appointment, patient to follow up with clinical pharmacist and will proceed with application for Eliquis patient assistance for current calendar year.   [name], PharmD, Patsy [place], CPP Clinical Pharmacist [facility] [phone]

## 2023-01-23 ENCOUNTER — Other Ambulatory Visit: Payer: Self-pay | Admitting: Cardiovascular Disease

## 2023-01-23 ENCOUNTER — Telehealth: Payer: Self-pay | Admitting: Family Medicine

## 2023-01-23 DIAGNOSIS — I4821 Permanent atrial fibrillation: Secondary | ICD-10-CM

## 2023-01-23 NOTE — Telephone Encounter (Signed)
Medication Refill - Medication:  gabapentin (NEURONTIN) 100 MG capsule  Completely out   Has the patient contacted their pharmacy?  Yes, Pharmacy was going to contact office for this refill, I advised patient it is showing it was received by pharmacy on 01/18/23 and patient states he was at pharmacy on 01/19/23 and it was not there. Patient states he will check again today.    Preferred Pharmacy (with phone number or street name): [facility] [address] [place]), [place] - [address]  Phone: [phone] Fax: [phone]  Has the patient been seen for an appointment in the last year OR does the patient have an upcoming appointment? Yes

## 2023-01-23 NOTE — Telephone Encounter (Signed)
Prescription refill request for Eliquis received. Indication: Afib  Last office visit: 12/17/22 Fransico Michael)  Scr: 0.99 (08/07/22)   Age: 77 Weight: 90.7kg  Appropriate dose. Refill sent.

## 2023-01-23 NOTE — Telephone Encounter (Signed)
Refill request

## 2023-01-25 DIAGNOSIS — J449 Chronic obstructive pulmonary disease, unspecified: Secondary | ICD-10-CM | POA: Diagnosis not present

## 2023-01-25 DIAGNOSIS — G4733 Obstructive sleep apnea (adult) (pediatric): Secondary | ICD-10-CM | POA: Diagnosis not present

## 2023-01-25 DIAGNOSIS — R058 Other specified cough: Secondary | ICD-10-CM | POA: Diagnosis not present

## 2023-01-29 ENCOUNTER — Other Ambulatory Visit: Payer: Self-pay | Admitting: Pharmacist

## 2023-01-29 NOTE — Patient Instructions (Signed)
Please watch the mail for an envelope from [employer] containing the patient assistance program application. Please complete this application and mail back to [facility] Technician Noreene Larsson [place] along with a copy of your Medicare Part D prescription card and a copy of your proof of income document OR you can bring these documents to the office to have them faxed back to Attention: [name] at Fax # [phone]   If you need to call Noreene Larsson, you can reach her at [phone]  Thank you!  [name], PharmD, [place]y [place], CPP Clinical Pharmacist [facility] [phone]

## 2023-01-29 NOTE — Progress Notes (Signed)
Outreach Note  01/29/2023 Name: Philip CARRISALEZ Sr. MRN: [id number] DOB: Sep 15, 1945  Receive a message from patient's spouse reporting patient/spouse have now met 3% household out of pocket spend requirement of [employer] patient assistance program for Eliquis and will obtain print out from their pharmacies.  Meets financial criteria for Eliquis patient assistance program through [employer]. Will collaborate with provider, CPhT, and patient to pursue assistance.   Follow Up Plan: Clinical Pharmacist will follow up with patient by telephone on 02/23/2023 at 10:00 AM, as previously scheduled   [name], PharmD, P[place]y [place], CPP Clinical Pharmacist [facility] [phone]

## 2023-01-30 ENCOUNTER — Other Ambulatory Visit: Payer: Self-pay | Admitting: Family Medicine

## 2023-01-30 ENCOUNTER — Other Ambulatory Visit: Payer: Self-pay | Admitting: Cardiovascular Disease

## 2023-01-30 DIAGNOSIS — E1142 Type 2 diabetes mellitus with diabetic polyneuropathy: Secondary | ICD-10-CM

## 2023-01-31 NOTE — Telephone Encounter (Signed)
Requested Prescriptions  Pending Prescriptions Disp Refills   ONETOUCH ULTRA test strip [Pharmacy Med Name: OneTouch Ultra Blue In Vitro Strip] 200 each 0    Sig: USE TO CHECK GLUCOSE TWICE DAILY     Endocrinology: Diabetes - Testing Supplies Passed - 01/30/2023  5:00 PM      Passed - Valid encounter within last 12 months    Recent Outpatient Visits           1 week ago Type 2 diabetes mellitus with peripheral neuropathy [facility])   [place] [facility] Delles, Gentry Fitz A, RPH-CPP   2 months ago Type 2 diabetes mellitus with peripheral neuropathy [facility])   [place] [facility] Delles, Gentry Fitz A, RPH-CPP   3 months ago COPD exacerbation [facility])   [place] [facility] [place], [place] W, NP   4 months ago Type 2 diabetes mellitus with peripheral neuropathy [facility])   [place] [facility] Delles, Gentry Fitz A, RPH-CPP   5 months ago Type 2 diabetes mellitus with peripheral neuropathy [facility])   [place] [facility] Delles, Jackelyn Poling, RPH-CPP       Future Appointments             In 6 days Althea Charon, Netta Neat, DO  [facility], [facility]

## 2023-02-02 ENCOUNTER — Telehealth: Payer: Self-pay | Admitting: Pharmacy Technician

## 2023-02-02 DIAGNOSIS — Z5986 Financial insecurity: Secondary | ICD-10-CM

## 2023-02-02 NOTE — Progress Notes (Signed)
[facility] Customer service manager [facility])                                            [facility] Quality Pharmacy Team    02/02/2023  Philip [name] Sr. 1945-12-17 [id number]                                      Medication Assistance Referral  Referral From:  [facility] PharmD [name]  Medication/Company: Eliquis / [employer] Patient application portion:  Mailed Provider application portion: Faxed  to Dr. [name] Provider address/fax verified via: Office website  [name], CPhT [place]  Office: [phone] Fax: [phone] Email: [name]

## 2023-02-06 ENCOUNTER — Encounter: Payer: Self-pay | Admitting: Family Medicine

## 2023-02-06 ENCOUNTER — Ambulatory Visit (INDEPENDENT_AMBULATORY_CARE_PROVIDER_SITE_OTHER): Payer: Medicare Other | Admitting: Family Medicine

## 2023-02-06 VITALS — BP 104/60 | HR 61 | Ht 71.0 in | Wt 193.0 lb

## 2023-02-06 DIAGNOSIS — Z23 Encounter for immunization: Secondary | ICD-10-CM

## 2023-02-06 DIAGNOSIS — E1142 Type 2 diabetes mellitus with diabetic polyneuropathy: Secondary | ICD-10-CM | POA: Diagnosis not present

## 2023-02-06 DIAGNOSIS — N182 Chronic kidney disease, stage 2 (mild): Secondary | ICD-10-CM | POA: Diagnosis not present

## 2023-02-06 DIAGNOSIS — I5022 Chronic systolic (congestive) heart failure: Secondary | ICD-10-CM | POA: Diagnosis not present

## 2023-02-06 DIAGNOSIS — I700 Atherosclerosis of aorta: Secondary | ICD-10-CM | POA: Diagnosis not present

## 2023-02-06 DIAGNOSIS — I129 Hypertensive chronic kidney disease with stage 1 through stage 4 chronic kidney disease, or unspecified chronic kidney disease: Secondary | ICD-10-CM | POA: Diagnosis not present

## 2023-02-06 LAB — POCT GLYCOSYLATED HEMOGLOBIN (HGB A1C): Hemoglobin A1C: 6.6 % — AB (ref 4.0–5.6)

## 2023-02-06 MED ORDER — METFORMIN HCL 1000 MG PO TABS
1000.0000 mg | ORAL_TABLET | Freq: Two times a day (BID) | ORAL | 3 refills | Status: DC
Start: 2023-02-06 — End: 2024-01-15

## 2023-02-06 NOTE — Progress Notes (Unsigned)
Subjective:    Patient ID: Philip Spitz., male    DOB: 1946/03/17, 77 y.o.   MRN: [id number]  Philip CERAVOLO Sr. is a 77 y.o. male presenting on 02/06/2023 for Diabetes, Hypertension, Hypothyroidism, and Emphysema   HPI  Discussed the use of AI scribe software for clinical note transcription with the patient, who gave verbal consent to proceed.     The patient, with a history of diabetes, hypertension, and heart disease, presents for a six-month follow-up.     Hypertension CKD III LOW BP readings  He reports a recent decrease in blood pressure over the past four days, with the lowest reading being 77/55, accompanied by feelings of lightheadedness and imbalance. The patient denies any significant changes in diet, fluid intake, or caffeine consumption, but notes a conscious effort to limit sweet intake. Today 104/60 He describes wave of lightheadedness with standing and activity. Lowest reading 77/55 (5+ days ago onset) Wt down 7 lbs Has not taken Furosemide for a while, he has not gained weight and no leg swelling. Current med - Losartan 25mg  daily in addition to Spironolactone 25mg  daily - Also taking Farxiga 10mg  daily  Note in medicare donut hole coverage gap, higher cost >300 for Eliquis, so working on assistance.  Aortic Atherosclerosis Prior noted on Chest X-ray with calicifications of aorta He has known diagnosis of coronary artery disease atherosclerosis He remains on Statin, ASA, and blood thinner anticoagulant   CHRONIC DM, Type 2: Doing well Meds: Metformin 1000mg  twice daily (500mg  tabs - note needs new order for 1000mg  tabs for twice a day. On Farxiga 10mg  daily (through PAP assistance) - OFF Januvia, Bydureon, Lantus Reports good compliance. Tolerating well w/o side-effects Currently on ARB, on Statin - Exercise: Walking at work, no other changes - Complicated by peripheral neuropathy and CKD-II to III - with now improved kidney function on last  lab. increase in neuropathy symptoms, describing sudden, sharp pains in the leg. However, he considers these symptoms manageable and not severe enough to warrant additional intervention. Denies hypoglycemia   Lumbar DDD S/p Lumbar diskectomy 01/07/21, R L3-4 microdiskectomy - done by [facility] He has had issue with prolong sitting and issues since surgery but he has done well today and doing better.   HYPERLIPIDEMIA: - Reports no concerns. Last lipid panel 06/2021, controlled on Statin - Currently taking Atorvastatin 40mg , tolerating well without side effects or myalgias   Hypothyroidism On Euthyrox daily Last labs 07/2022   Centrilobular Emphysema Dramatic improvement on respiratory status with Breztri 2 puff twice a day Breathing much better, less sputum  Not using rescue inhaler or nebulizer Overall much improved       Health Maintenance:   PSA 0.61 (negative result, 07/2022)   Future Colon CA Screening, reconsider home cologuard if interested.       02/06/2023    9:32 AM 08/18/2022    1:09 PM 08/07/2022    8:54 AM  Depression screen PHQ 2/9  Decreased Interest 1 0 0  Down, Depressed, Hopeless 0 0 0  PHQ - 2 Score 1 0 0  Altered sleeping 0 0 1  Tired, decreased energy 1 0 1  Change in appetite 0 0 0  Feeling bad or failure about yourself  0 0 0  Trouble concentrating 0 0 0  Moving slowly or fidgety/restless 0 0 0  Suicidal thoughts 0 0 0  PHQ-9 Score 2 0 2  Difficult doing work/chores Not difficult at all Not difficult at  all Not difficult at all    Past Medical History:  Diagnosis Date   (HFpEF) heart failure with preserved ejection fraction (HCC)    a. 06/2015 Echo: EF 50-55%, Gr1 DD. Mild MR. Mildly dil LA. Nl RV fxn. Nl PASP.   Arthritis    a. right knee   Asthma    Atrial tachycardia (HCC)    Chronic anticoagulation    Apixaban   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 etiology.   COPD (chronic obstructive pulmonary disease) (HCC)     Coronary artery disease    a. 05/2012 Cath: severe 3VD-->CABG x 3 by Dr. Tyrone Sage in 01/07/2014K[place]a [place], SVG->LCX & SVG->RPDA; b. 06/2014 MV: no ischemia/infarct.   DDD (degenerative disc disease), lumbar    Dyspnea    Edema    LEGS/ FEET   Essential hypertension    History of Helicobacter pylori infection    HNP (herniated nucleus pulposus), lumbar    HOH (hard of hearing)    Hypercholesteremia    Hypothyroidism    Low back pain radiating to lower extremity    Lumbar radiculitis    Lumbar stenosis with neurogenic claudication    Neuropathy    PAF (paroxysmal atrial fibrillation) (HCC)    a. CHA2DS2VASc = 5 (age, CHF, HTN, vascular disase, T2DM) --> Eliquis.   Sepsis (HCC) 2021   Shingles    Sleep apnea    NO CPAP   Type II diabetes mellitus [facility])    Past Surgical History:  Procedure Laterality Date   ANTERIOR CERVICAL DECOMP/DISCECTOMY FUSION  ~ 2009   CARDIAC CATHETERIZATION  05/06/2012   Significant three-vessel coronary artery disease with normal ejection fraction   CATARACT EXTRACTION W/PHACO Right 08/15/2017   Procedure: CATARACT EXTRACTION PHACO AND INTRAOCULAR LENS PLACEMENT (IOC);  Surgeon: Galen Manila, MD;  Location: [facility] ORS;  Service: Ophthalmology;  Laterality: Right;  [place] 00:23 AP% 13.3 CDE 3.11 Fluid pack lot # [id number]   CATARACT EXTRACTION W/PHACO Left 10/03/2017   Procedure: CATARACT EXTRACTION PHACO AND INTRAOCULAR LENS PLACEMENT (IOC);  Surgeon: Galen Manila, MD;  Location: [facility] ORS;  Service: Ophthalmology;  Laterality: Left;  Lot #[id number] H [place]: 00:21.2 AP%:13.9 CDE: 2.96   CORONARY ARTERY BYPASS GRAFT  05/07/2012   Procedure: CORONARY ARTERY BYPASS GRAFTING (CABG);  Surgeon: Delight Ovens, MD;  Location: [facility] OR;  Service: Open Heart Surgery;  Laterality: N/A;  times three   INTRAOPERATIVE TRANSESOPHAGEAL ECHOCARDIOGRAM  05/07/2012   Procedure: INTRAOPERATIVE TRANSESOPHAGEAL ECHOCARDIOGRAM;  Surgeon: Delight Ovens, MD;  [facility];   Service: Open Heart Surgery;  Laterality: N/A;   KNEE ARTHROPLASTY Right 03/07/2021   Procedure: COMPUTER ASSISTED TOTAL RIGHT KNEE ARTHROPLASTY -;  Surgeon: Donato Heinz, MD;  Location: [facility] ORS;  Service: Orthopedics;  Laterality: Right;   KNEE ARTHROSCOPY Right ~ 2000   KNEE SURGERY Right 2003   LUMBAR LAMINECTOMY/DECOMPRESSION MICRODISCECTOMY Right 01/07/2021   Procedure: RIGHT L3-4 MICRODISCECTOMY 1 LEVEL;  Surgeon: Venetia Night, MD;  Location: [facility] ORS;  Service: Neurosurgery;  Laterality: Right;   TEE WITHOUT CARDIOVERSION N/A 07/04/2019   Procedure: TRANSESOPHAGEAL ECHOCARDIOGRAM (TEE);  Surgeon: Yvonne Kendall, MD;  Location: [facility] ORS;  Service: Cardiovascular;  Laterality: N/A;   Social History   Socioeconomic History   Marital status: Married    Spouse name: Clide Dales   Number of children: 4   Years of education: Not on file   Highest education level: Some college, no degree  Occupational History   Occupation: retired  Tobacco  Use   Smoking status: Former    Current packs/day: 0.00    Average packs/day: 1 pack/day for 8.0 years (8.0 ttl pk-yrs)    Types: Cigarettes    Start date: 12/31/1959    Quit date: 12/31/1967    Years since quitting: 55.1   Smokeless tobacco: Former  Building services engineer status: Never Used  Substance and Sexual Activity   Alcohol use: No    Alcohol/week: 0.0 standard drinks of alcohol    Comment: 05/06/2012 "last alcohol several years ago; never had problem wit"   Drug use: No   Sexual activity: Not Currently  Other Topics Concern   Not on file  Social History Narrative   ** Merged History Encounter **          Working part time - not currently working because of covid-19    Social Determinants of Corporate investment banker Strain: Low Risk  (01/25/2023)   Received from [facility]   Overall Financial Resource Strain (CARDIA)    Difficulty of Paying Living Expenses: Not hard at all  Food Insecurity: No Food  Insecurity (01/25/2023)   Received from [facility]   Hunger Vital Sign    Worried About Running Out of Food in the Last Year: Never true    Ran Out of Food in the Last Year: Never true  Transportation Needs: No Transportation Needs (01/25/2023)   Received from [facility] - Transportation    In the past 12 months, has lack of transportation kept you from medical appointments or from getting medications?: No    Lack of Transportation (Non-Medical): No  Physical Activity: Insufficiently Active (08/18/2022)   Exercise Vital Sign    Days of Exercise per Week: 3 days    Minutes of Exercise per Session: 30 min  Stress: No Stress Concern Present (08/18/2022)   [employer] of Occupational Health - Occupational Stress Questionnaire    Feeling of Stress : Not at all  Social Connections: Moderately Integrated (08/18/2022)   Social Connection and Isolation Panel [NHANES]    Frequency of Communication with Friends and Family: More than three times a week    Frequency of Social Gatherings with Friends and Family: Twice a week    Attends Religious Services: More than 4 times per year    Active Member of [employer] or Organizations: No    Attends Banker Meetings: Never    Marital Status: Married  Catering manager Violence: Not At Risk (08/18/2022)   Humiliation, Afraid, Rape, and Kick questionnaire    Fear of Current or Ex-Partner: No    Emotionally Abused: No    Physically Abused: No    Sexually Abused: No   Family History  Problem Relation Age of Onset   Heart disease Mother    Clotting disorder Mother    Hypertension Sister    Current Outpatient Medications on File Prior to Visit  Medication Sig   acetaminophen (TYLENOL) 500 MG tablet Take 1,000 mg by mouth every 8 (eight) hours as needed for moderate pain or mild pain.   Albuterol Sulfate 108 (90 Base) MCG/ACT AEPB Inhale 2 puffs into the lungs every 6 (six) hours as needed  (shortness of breath).    apixaban (ELIQUIS) 5 MG TABS tablet Take 1 tablet by mouth twice daily   ARTIFICIAL TEAR SOLUTION OP Place 1 drop into both eyes daily.   atorvastatin (LIPITOR) 40 MG tablet Take 1 tablet by  mouth once daily   Blood Glucose Monitoring Suppl (ONE TOUCH ULTRA 2) w/Device KIT Use to check blood sugar as advised up to twice a day   BREZTRI AEROSPHERE 160-9-4.8 MCG/ACT AERO Inhale 2 puffs into the lungs 2 (two) times daily.   calcium carbonate (TUMS - DOSED IN MG ELEMENTAL CALCIUM) 500 MG chewable tablet Chew 1,000 mg by mouth daily as needed for indigestion or heartburn.   cetirizine (ZYRTEC) 10 MG tablet Take 10 mg by mouth daily.   cyanocobalamin (VITAMIN B12) 1000 MCG tablet Take 1,000 mcg by mouth daily.   FARXIGA 10 MG TABS tablet Take 1 tablet (10 mg total) by mouth daily before breakfast.   fluticasone (FLONASE) 50 MCG/ACT nasal spray Place 2 sprays into both nostrils daily.   furosemide (LASIX) 40 MG tablet TAKE 1/2 TO 1 (ONE-HALF TO ONE) TABLET BY MOUTH ONCE DAILY AS NEEDED FOR EDEMA. ONLY AS NEEDED.   gabapentin (NEURONTIN) 100 MG capsule TAKE 1 CAPSULE BY MOUTH THREE TIMES DAILY   ipratropium-albuterol (DUONEB) 0.5-2.5 (3) MG/3ML SOLN Take 3 mLs by nebulization every 6 (six) hours as needed (shortness of breath).   LANCETS ULTRA THIN MISC 1 Device by Does not apply route 4 (four) times daily -  before meals and at bedtime.   levothyroxine (SYNTHROID) 88 MCG tablet Take 1 tablet (88 mcg total) by mouth daily before breakfast.   losartan (COZAAR) 25 MG tablet TAKE 1 TABLET BY MOUTH ONCE DAILY IN THE MORNING   metoprolol tartrate (LOPRESSOR) 50 MG tablet TAKE 1 & 1/2 (ONE & ONE-HALF) TABLETS BY MOUTH TWICE DAILY   montelukast (SINGULAIR) 10 MG tablet Take 1 tablet by mouth at bedtime.   ONETOUCH ULTRA test strip USE TO CHECK GLUCOSE TWICE DAILY   spironolactone (ALDACTONE) 25 MG tablet Take 1/2 (one-half) tablet by mouth once daily   No current  facility-administered medications on file prior to visit.    Review of Systems Per HPI unless specifically indicated above      Objective:    BP 104/60   Pulse 61   Ht 5\' 11"  (1.803 m)   Wt 193 lb (87.5 kg)   SpO2 98%   BMI 26.92 kg/m   Wt Readings from Last 3 Encounters:  02/06/23 193 lb (87.5 kg)  10/16/22 200 lb (90.7 kg)  08/18/22 196 lb 9.6 oz (89.2 kg)    Physical Exam Vitals and nursing note reviewed.  Constitutional:      General: He is not in acute distress.    Appearance: Normal appearance. He is well-developed. He is not diaphoretic.     Comments: Well-appearing, comfortable, cooperative  HENT:     Head: Normocephalic and atraumatic.  Eyes:     General:        Right eye: No discharge.        Left eye: No discharge.     Conjunctiva/sclera: Conjunctivae normal.  Neck:     Thyroid: No thyromegaly.  Cardiovascular:     Rate and Rhythm: Normal rate and regular rhythm.     Pulses: Normal pulses.     Heart sounds: Normal heart sounds. No murmur heard. Pulmonary:     Effort: Pulmonary effort is normal. No respiratory distress.     Breath sounds: Normal breath sounds. No wheezing or rales.  Musculoskeletal:        General: Normal range of motion.     Cervical back: Normal range of motion and neck supple.  Lymphadenopathy:     Cervical: No cervical adenopathy.  Skin:    General: Skin is warm and dry.     Findings: No erythema or rash.  Neurological:     Mental Status: He is alert and oriented to person, place, and time. Mental status is at baseline.  Psychiatric:        Mood and Affect: Mood normal.        Behavior: Behavior normal.        Thought Content: Thought content normal.     Comments: Well groomed, good eye contact, normal speech and thoughts     Recent Labs    08/07/22 0932 02/06/23 0937  HGBA1C 7.2* 6.6*    Results for orders placed or performed in visit on 02/06/23  POCT glycosylated hemoglobin (Hb A1C)  Result Value Ref Range    Hemoglobin A1C 6.6 (A) 4.0 - 5.6 %   HbA1c POC (<> result, manual entry)     HbA1c, POC (prediabetic range)     HbA1c, POC (controlled diabetic range)        Assessment & Plan:   Problem List Items Addressed This Visit     Aortic atherosclerosis (HCC)    Seen on prior CXR 07/02/19 and known CAD Patient on statin, aspirni, eliquis currently       Benign hypertension with CKD (chronic kidney disease), stage II    Well-controlled HYPERTENSION, now with lower BP readings Question if too much medication impacting his BP associated with history of some weight loss  Complication with CKD    Plan:  -Hold Losartan for 1-2 weeks and monitor blood pressure at home. -If low blood pressure persists, consider increasing fluid and sodium intake.  Keep on diuretic regimen with Spironolactone 25mg  daily for now, may warrant HALF dose for 12.5mg  in future  Continue Farxiga 10mg , also this dose may need to be reduced  Remain OFF Furosemide since no edema and fluid is balanced. Use AS NEEDED only   2. Encourage improved lifestyle - diet, regular exercise 3. Continue monitor BP outside office, bring readings to next visit, if persistently >140/90 or new symptoms notify office sooner      Chronic systolic CHF (congestive heart failure) (HCC)    Stable, euvolemic Followed by Cardiology Reviewed lightheaded and low BP, suspect fluid loss HOLD ARB 25mg  losartan, may resume in future May continue Spiro 25mg  and [place] 10mg  but may need to adjust or lower these as well      Type 2 diabetes mellitus with peripheral neuropathy (HCC) - Primary    Controlled to 6.6 Noted weight loss - No hypoglycemia Complications - CKD-II, peripheral neuropathy, cataracts Remains OFF GLP1 Bydureon (allergy), and Lantus insulin, Januvia  Plan:  Continue Farxiga 10mg  through PAP Dose adjust Metformin from 500x2 = 1000mg  TWICE A DAY, will order 1000mg  tab BID  Encourage improved lifestyle - low carb, low sugar  diet, reduce portion size, continue improving regular exercise - emphasized limiting sweets / eating out - he admits these are raising sugar lately Check CBG, bring log to next visit for review Continue ARB, Statin      Relevant Medications   metFORMIN (GLUCOPHAGE) 1000 MG tablet   Other Relevant Orders   POCT glycosylated hemoglobin (Hb A1C) (Completed)   Other Visit Diagnoses     Need for influenza vaccination       Relevant Orders   Flu Vaccine Trivalent High Dose (Fluad) (Completed)         General Health Maintenance -Administer influenza vaccine today. -Schedule follow-up appointment in six months (April  2025) for full blood panel and physical examination. -Contact doctor in 1-2 weeks to report blood pressure readings.        Orders Placed This Encounter  Procedures   Flu Vaccine Trivalent High Dose (Fluad)   POCT glycosylated hemoglobin (Hb A1C)     Meds ordered this encounter  Medications   metFORMIN (GLUCOPHAGE) 1000 MG tablet    Sig: Take 1 tablet (1,000 mg total) by mouth 2 (two) times daily with a meal.    Dispense:  180 tablet    Refill:  3    Dose adjustment from 500 to 1000mg       Follow up plan: Return in about 6 months (around 08/07/2023) for 6 month fasting lab only then 1 week later Annual Physical (AM preferred).  Saralyn Pilar, DO [facility] 02/06/2023, 9:50 AM

## 2023-02-06 NOTE — Patient Instructions (Addendum)
Thank you for coming to the office today.  Due to the low blood pressures  HOLD Losartan 25mg  daily for 1-2 weeks to monitor pressures at home.  See how you feel off of medicine. It may be too much for you right now.  The Spirnolactone can also lower BP and reduce fluid  Marcelline Deist can do this as well.  If LOW BP again < 90/50 with lightheaded dizzy or symptoms - feel free to take in / eat a salty snack and increased water / fluids to help boost the pressure.  Improved A1c  Recent Labs    08/07/22 0932 02/06/23 0937  HGBA1C 7.2* 6.6*   New metformin dosage for 1000mg  twice a day 1 pill only, will be sent.   Please schedule a Follow-up Appointment to: Return in about 6 months (around 08/07/2023) for 6 month fasting lab only then 1 week later Annual Physical (AM preferred).  If you have any other questions or concerns, please feel free to call the office or send a message through MyChart. You may also schedule an earlier appointment if necessary.  Additionally, you may be receiving a survey about your experience at our office within a few days to 1 week by e-mail or mail. We value your feedback.  Saralyn Pilar, DO [facility], [place]

## 2023-02-07 NOTE — Assessment & Plan Note (Signed)
Controlled to 6.6 Noted weight loss - No hypoglycemia Complications - CKD-II, peripheral neuropathy, cataracts Remains OFF GLP1 Bydureon (allergy), and Lantus insulin, Januvia  Plan:  Continue Farxiga 10mg  through PAP Dose adjust Metformin from 500x2 = 1000mg  TWICE A DAY, will order 1000mg  tab BID  Encourage improved lifestyle - low carb, low sugar diet, reduce portion size, continue improving regular exercise - emphasized limiting sweets / eating out - he admits these are raising sugar lately Check CBG, bring log to next visit for review Continue ARB, Statin

## 2023-02-07 NOTE — Assessment & Plan Note (Signed)
Stable, euvolemic Followed by Cardiology Reviewed lightheaded and low BP, suspect fluid loss HOLD ARB 25mg  losartan, may resume in future May continue Spiro 25mg  and Farxiga 10mg  but may need to adjust or lower these as well

## 2023-02-07 NOTE — Assessment & Plan Note (Addendum)
Well-controlled HYPERTENSION, now with lower BP readings Question if too much medication impacting his BP associated with history of some weight loss  Complication with CKD    Plan:  -Hold Losartan for 1-2 weeks and monitor blood pressure at home. -If low blood pressure persists, consider increasing fluid and sodium intake.  Keep on diuretic regimen with Spironolactone 25mg  daily for now, may warrant HALF dose for 12.5mg  in future  Continue Farxiga 10mg , also this dose may need to be reduced  Remain OFF Furosemide since no edema and fluid is balanced. Use AS NEEDED only   2. Encourage improved lifestyle - diet, regular exercise 3. Continue monitor BP outside office, bring readings to next visit, if persistently >140/90 or new symptoms notify office sooner

## 2023-02-07 NOTE — Assessment & Plan Note (Addendum)
Seen on prior CXR 07/02/19 and known CAD Patient on statin, aspirni, eliquis currently

## 2023-02-15 ENCOUNTER — Other Ambulatory Visit: Payer: Self-pay | Admitting: Cardiovascular Disease

## 2023-02-15 NOTE — Telephone Encounter (Signed)
Hi,  Could you schedule this patient a 6 month follow up visit? The patient was last seen on 08-17-2022. Thank you so much.

## 2023-02-23 ENCOUNTER — Ambulatory Visit: Payer: Medicare Other | Admitting: Pharmacist

## 2023-02-23 ENCOUNTER — Other Ambulatory Visit: Payer: Self-pay | Admitting: Family Medicine

## 2023-02-23 DIAGNOSIS — E1142 Type 2 diabetes mellitus with diabetic polyneuropathy: Secondary | ICD-10-CM

## 2023-02-23 DIAGNOSIS — I5022 Chronic systolic (congestive) heart failure: Secondary | ICD-10-CM

## 2023-02-23 DIAGNOSIS — J432 Centrilobular emphysema: Secondary | ICD-10-CM

## 2023-02-23 DIAGNOSIS — I129 Hypertensive chronic kidney disease with stage 1 through stage 4 chronic kidney disease, or unspecified chronic kidney disease: Secondary | ICD-10-CM

## 2023-02-23 DIAGNOSIS — N182 Chronic kidney disease, stage 2 (mild): Secondary | ICD-10-CM

## 2023-02-23 NOTE — Patient Instructions (Signed)
Goals Addressed             This Visit's Progress    Pharmacy Goals       Our goal A1c is less than 7%. This corresponds with fasting sugars less than 130 and 2 hour after meal sugars less than 180. Please check your blood sugar and keep log of results  Please continue to monitor your home weight, blood pressure and heart rate  Our goal bad cholesterol, or LDL, is less than 70 . This is why it is important to continue taking your atorvastatin  Feel free to call me with any questions or concerns. I look forward to our next call!    [name], PharmD, [facility] Clinical Pharmacist [facility] [phone]

## 2023-02-23 NOTE — Progress Notes (Signed)
02/23/2023 Name: Philip [name] Sr. MRN: [id number] DOB: 05-29-1945  Chief Complaint  Patient presents with   Medication Assistance   Medication Management    Philip [name]. is a 77 y.o. year old male who presented for a telephone visit.   They were referred to the pharmacist by their PCP for assistance in managing diabetes, hypertension, hyperlipidemia, and medication access.    Subjective:   Care Team: Primary Care Provider: Smitty Cords, DO; Next Scheduled Visit: 08/14/2023 Cardiologist: Philip Ouch, MD Pulmonologist: Philip Moores, MD; Next Scheduled Visit: 07/25/2023 Orthopedics: Philip Capuchin, MD; Next Scheduled Visit: 03/08/2023  Medication Access/Adherence  Current Pharmacy:  [facility] [address]), [place] - [address] [place] ([place]) [place] [address] Phone: [phone] Fax: [phone]   Patient reports affordability concerns with their medications: No  Patient reports access/transportation concerns to their pharmacy: No  Patient reports adherence concerns with their medications:  No     Patient uses weekly pillbox   Type 2 Diabetes:   Current medications: metformin 1,000 mg twice daily Farxiga 10 mg - 1 tablet QAM     Current glucose readings:    Fasting Blood Glucose After Supper/Bedtime  19 - October 116 192  20 - October 126 148  21 - October 132 -  22 - October 136 118  23 - October 139 -  24 - October 127 153  25 - October 145      Reports using blood sugar readings as feedback on dietary choices   Denies symptoms of hypoglycemia   Current medication access support: enrolled in patient assistance for Farxiga from [employer] through 05/01/2023  Today assist patient with starting AZ[employer]Me Digital Assistant for re-enrollment online & patient completes via text.   CHF/Hypertension/ Atrial Fibrillation:   From review of chart, note patient seen for Office Visit with  PCP on 02/06/2023, provide advised patient to: - Hold Losartan for 1-2 weeks and monitor blood pressure at home. -If low blood pressure persists, consider increasing fluid and sodium intake.  Current medications:  Metoprolol 75 mg twice daily Spironolactone 25 mg - 1/2 tablet (12.5 mg) daily  Farxiga 10 mg - 1 tablets daily Furosemide 40 mg daily only as needed for edema as directed by Cardiologist Confirms off of losartan since office visit with PCP on 02/06/2023   Anticoagulation: Eliquis 5 mg twice daily  Today patient reports home BP readings have been good since off of losartan, so as remained off  Patient has an automated, upper arm home BP cuff Current blood pressure readings readings:    AM BP PM BP  19 - October 127/78, HR 66 112/75, HR 64  20 - October 131/81, HR 69 111/81, HR 72  21 - October 122/82, HR 72 -  22 - October 120/74, HR 68 97/75, HR 84  23 - October 115/76, HR 79 -  24 - October 112/75, HR 65 114/69, HR 64  25 - October 118/83, HR 63      Denies symptoms of hypotension such as lightheadedness or dizziness   Confirms monitoring home weight daily as directed by Cardiology    Current medication access support: none - Collaborating with PCP and CPhT to pursue enrollment in patient assistance for Eliquis from [employer]MS Today spouse reports that he has completed application and planning to bring this and OOP expense reports back to office to be faxed back to CPhT   COPD/seasonal allergies: Patient follows with  Dr. Meredeth [name] at [facility]   Current medications: Philip [name] 2 puffs twice daily Montelukast 10 mg QHS Albuterol inhaler as needed Cetirizine 10 mg daily Flonase nasal spray as needed    Confirms using Breztri inhaler consistently as directed and rinsing mouth out after each use   Current medication access support: Enrolled in [place] patient assistance program through [employer]Me through 05/01/2023 Today assist patient with starting AZ[employer]Me  Digital Assistant for re-enrollment online & patient completes via text.    Objective:  Lab Results  Component Value Date   HGBA1C 6.6 (A) 02/06/2023    Lab Results  Component Value Date   CREATININE 0.99 08/07/2022   BUN 18 08/07/2022   NA 144 08/07/2022   K 4.6 08/07/2022   CL 106 08/07/2022   CO2 30 08/07/2022    Lab Results  Component Value Date   CHOL 120 08/07/2022   HDL 56 08/07/2022   LDLCALC 48 08/07/2022   TRIG 75 08/07/2022   CHOLHDL 2.1 08/07/2022   BP Readings from Last 3 Encounters:  02/06/23 104/60  10/16/22 122/72  08/18/22 98/60   Pulse Readings from Last 3 Encounters:  02/06/23 61  10/16/22 85  08/17/22 75     Medications Reviewed Today     Reviewed by Philip [name], RPH-CPP (Pharmacist) on 02/23/23 at 1001  Med List Status: <None>   Medication Order Taking? Sig Documenting Provider Last Dose Status Informant  acetaminophen (TYLENOL) 500 MG tablet [id number]  Take 1,000 mg by mouth every 8 (eight) hours as needed for moderate pain or mild pain. [provider]  Active Self  Albuterol Sulfate 108 (90 Base) MCG/ACT AEPB [id number]  Inhale 2 puffs into the lungs every 6 (six) hours as needed (shortness of breath).  [provider]  Active Self  apixaban (ELIQUIS) 5 MG TABS tablet [id number] Yes Take 1 tablet by mouth twice daily Philip Ouch, MD Taking Active   ARTIFICIAL TEAR SOLUTION OP [id number]  Place 1 drop into both eyes daily. [provider]  Active Self  atorvastatin (LIPITOR) 40 MG tablet [id number]  Take 1 tablet by mouth once daily Philip Ouch, MD  Active   Blood Glucose Monitoring Suppl (ONE TOUCH ULTRA 2) w/Device KIT [id number]  Use to check blood sugar as advised up to twice a day Philip Cords, DO  Active Self  BREZTRI AEROSPHERE 160-9-4.8 MCG/ACT Sandrea Matte [id number] Yes Inhale 2 puffs into the lungs 2 (two) times daily. Philip Cords, DO Taking Active   calcium carbonate (TUMS  - DOSED IN MG ELEMENTAL CALCIUM) 500 MG chewable tablet [id number]  Chew 1,000 mg by mouth daily as needed for indigestion or heartburn. [provider]  Active Self  cetirizine (ZYRTEC) 10 MG tablet [id number]  Take 10 mg by mouth daily. [provider]  Active Self  cyanocobalamin (VITAMIN B12) 1000 MCG tablet [id number]  Take 1,000 mcg by mouth daily. [provider]  Active   FARXIGA 10 MG TABS tablet [id number] Yes Take 1 tablet (10 mg total) by mouth daily before breakfast. Philip Cords, DO Taking Active Self  fluticasone (FLONASE) 50 MCG/ACT nasal spray [id number]  Place 2 sprays into both nostrils daily. Althea Charon, Netta Neat, DO  Active   furosemide (LASIX) 40 MG tablet [id number]  TAKE 1/2 TO 1 (ONE-HALF TO ONE) TABLET BY MOUTH ONCE DAILY AS NEEDED FOR EDEMA. ONLY AS NEEDED. Philip Cords, DO  Active   gabapentin (NEURONTIN) 100 MG capsule [id number]  TAKE 1 CAPSULE  BY MOUTH THREE TIMES DAILY Philip Cords, DO  Active   ipratropium-albuterol (DUONEB) 0.5-2.5 (3) MG/3ML SOLN [id number]  Take 3 mLs by nebulization every 6 (six) hours as needed (shortness of breath). [provider]  Active Self           Med Note Joella Prince A   Fri Dec 24, 2020  4:04 PM)    Tollie Eth THIN MISC [id number]  1 Device by Does not apply route 4 (four) times daily -  before meals and at bedtime. Delight Ovens, MD  Active Self  levothyroxine (SYNTHROID) 88 MCG tablet [id number]  Take 1 tablet (88 mcg total) by mouth daily before breakfast. Philip Cords, DO  Active   losartan (COZAAR) 25 MG tablet [id number] No TAKE 1 TABLET BY MOUTH ONCE DAILY IN THE MORNING  Patient not taking: Reported on 02/23/2023   Philip Ouch, MD Not Taking Active   metFORMIN (GLUCOPHAGE) 1000 MG tablet [id number] Yes Take 1 tablet (1,000 mg total) by mouth 2 (two) times daily with a meal. Althea Charon, Netta Neat, DO Taking Active   metoprolol tartrate  (LOPRESSOR) 50 MG tablet [id number] Yes TAKE ONE AND ONE-HALF TABLETS BY MOUTH TWICE DAILY Philip Ouch, MD Taking Active   montelukast (SINGULAIR) 10 MG tablet [id number]  Take 1 tablet by mouth at bedtime. [provider]  Active   Koren Bound test strip [id number]  USE TO CHECK GLUCOSE TWICE DAILY Althea Charon Netta Neat, DO  Active   spironolactone (ALDACTONE) 25 MG tablet [id number] Yes Take 1/2 (one-half) tablet by mouth once daily Philip Ouch, MD Taking Active               Assessment/Plan:   Patient to watch mail for letter from [employer] to confirm if re-enrolled in assistance program for [place] for 2025 - Will plan to collaborate with PCP to send renewal of Farxiga and Breztri prescriptions to program during next telephone appointment  COPD/Seasonal Allergies: - Reviewed appropriate inhaler technique - Patient to follow up with AZ&Me assistance program as needed regarding refills of Breztri inhaler   Diabetes: - Counseled on importance of continuing to control carbohydrate portion sizes and using blood sugar checks to reinforce dietary choices - Encourage patient to limit sweets and sugary beverages (sweet tea, Pepsi) - Recommend to continue to check glucose, keep log of results and to bring this record to medical appointments - Patient to follow up with AZ&Me assistance program as needed regarding refills of Farxiga   CHF/Hypertension/Atrial Fibrillation: - Encourage patient to continue to weigh daily and and follow directions from Cardiologist for use of PRN furosemide (Lasix).  - Recommended to continue to check home blood pressure and heart rate, keep log of results and contact Cardiologist for BP and HR if needed for readings outside of established parameters or symptoms  - Collaborating with PCP and CPhT to pursue enrollment in patient assistance for Eliquis from BMS    Follow Up Plan:    Clinical Pharmacist will follow up with patient  by telephone on 04/20/2023 at 9 am     [name], PharmD, [place], CPP Clinical Pharmacist [facility] [phone]

## 2023-03-01 ENCOUNTER — Telehealth: Payer: Self-pay | Admitting: Pharmacy Technician

## 2023-03-01 DIAGNOSIS — Z5986 Financial insecurity: Secondary | ICD-10-CM

## 2023-03-01 NOTE — Progress Notes (Signed)
[facility])                                            [facility] Quality Pharmacy Team    03/01/2023  Philip [name] Sr. 02/09/1946 [id number]  Received both patient and provider portion(s) of patient assistance application(s) for Eliquis. Faxed completed application and required documents into BMS.    [name], CPhT [place]  Office: [phone] Fax: [phone] Email: [email or web address]

## 2023-03-06 ENCOUNTER — Telehealth: Payer: Self-pay | Admitting: Pharmacy Technician

## 2023-03-06 ENCOUNTER — Encounter: Payer: Self-pay | Admitting: Pharmacy Technician

## 2023-03-06 ENCOUNTER — Other Ambulatory Visit: Payer: Self-pay | Admitting: Cardiovascular Disease

## 2023-03-06 DIAGNOSIS — Z5986 Financial insecurity: Secondary | ICD-10-CM

## 2023-03-06 NOTE — Progress Notes (Signed)
[facility])                                            [facility] Quality Pharmacy Team    03/06/2023  MATILDE MARKIE Sr. 07-Jul-1945 [id number]  Erroneous encounter.  [name], CPhT [place]  Office: [phone] Fax: [phone] Email: [email or web address]

## 2023-03-06 NOTE — Progress Notes (Signed)
[facility])                                            [facility] Quality Pharmacy Team    03/06/2023  MARQUETT BERTOLI Sr. 03-29-1946 [id number]  Care coordination call placed to BMS in regard to Eliquis application.  Spoke to [place] who informs patient has not met the 3% OOP requirement and will need to spend $310.17 more before 05/01/23 to qualify for the program.  In basket message sent to [facility] PharmD [name] to consult with patient on this outcome.  [name], CPhT [place]  Office: [phone] Fax: [phone] Email: [email or web address]

## 2023-03-06 NOTE — Telephone Encounter (Signed)
Please contact pt for future appointment. Pt due for 6 month f/u. 

## 2023-03-08 DIAGNOSIS — Z96651 Presence of right artificial knee joint: Secondary | ICD-10-CM | POA: Diagnosis not present

## 2023-03-09 ENCOUNTER — Ambulatory Visit (INDEPENDENT_AMBULATORY_CARE_PROVIDER_SITE_OTHER): Payer: Medicare Other | Admitting: Family Medicine

## 2023-03-09 ENCOUNTER — Encounter: Payer: Self-pay | Admitting: Family Medicine

## 2023-03-09 ENCOUNTER — Ambulatory Visit: Payer: Self-pay

## 2023-03-09 VITALS — BP 110/70 | HR 85 | Ht 71.0 in | Wt 193.0 lb

## 2023-03-09 DIAGNOSIS — J209 Acute bronchitis, unspecified: Secondary | ICD-10-CM | POA: Diagnosis not present

## 2023-03-09 DIAGNOSIS — J440 Chronic obstructive pulmonary disease with acute lower respiratory infection: Secondary | ICD-10-CM | POA: Diagnosis not present

## 2023-03-09 DIAGNOSIS — J432 Centrilobular emphysema: Secondary | ICD-10-CM

## 2023-03-09 DIAGNOSIS — R059 Cough, unspecified: Secondary | ICD-10-CM

## 2023-03-09 LAB — POCT INFLUENZA A/B
Influenza A, POC: NEGATIVE
Influenza B, POC: NEGATIVE

## 2023-03-09 LAB — POC COVID19 BINAXNOW: SARS Coronavirus 2 Ag: NEGATIVE

## 2023-03-09 MED ORDER — GUAIFENESIN-CODEINE 100-10 MG/5ML PO SYRP
5.0000 mL | ORAL_SOLUTION | Freq: Every evening | ORAL | 0 refills | Status: DC | PRN
Start: 2023-03-09 — End: 2023-04-20

## 2023-03-09 MED ORDER — LEVOFLOXACIN 500 MG PO TABS
500.0000 mg | ORAL_TABLET | Freq: Every day | ORAL | 0 refills | Status: DC
Start: 2023-03-09 — End: 2023-04-20

## 2023-03-09 MED ORDER — PREDNISONE 20 MG PO TABS
ORAL_TABLET | ORAL | 0 refills | Status: DC
Start: 2023-03-09 — End: 2023-04-20

## 2023-03-09 NOTE — Patient Instructions (Addendum)
   Please schedule a Follow-up Appointment to: Return if symptoms worsen or fail to improve.  If you have any other questions or concerns, please feel free to call the office or send a message through [place]. You may also schedule an earlier appointment if necessary.  Additionally, you may be receiving a survey about your experience at our office within a few days to 1 week by e-mail or mail. We value your feedback.  [name], DO [place]

## 2023-03-09 NOTE — Progress Notes (Signed)
Subjective:    Patient ID: Philip Spitz., male    DOB: 29-Jan-1946, 77 y.o.   MRN: [id number]  Philip VANWEELDEN Sr. is a 77 y.o. male presenting on 03/09/2023 for Cough (Persistent problem, recently worsening when the leaves began falling), Shortness of Breath, and Nasal Congestion  Patient presents for a same day appointment.   HPI  Discussed the use of AI scribe software for clinical note transcription with the patient, who gave verbal consent to proceed.  AECOPD  The patient, with a history of chronic respiratory issues COPD, presents with persistent congestion that has been worsening over time. He reports that previous treatment, including a course of steroids and antibiotics, provided initial relief but did not fully resolve the issue, previously documented 09/2022, but cannot locate recent course.  The patient has been spending time outdoors, which he questions as a potential aggravating factor. He describes a persistent cough that has become a chain reaction, where one cough triggers a series of subsequent coughs. This coughing has been disruptive to his sleep, as he has to get up due to congestion build-up.  The patient also reports episodes of low blood pressure, which have mostly improved and nearly resolved since discontinuation of Losartan. He still has had some episodes recent with BP 77/55  The patient is currently using a nebulizer twice daily and taking cough syrup for symptom management. He has been working with [facility] Dr. Meredeth Ide and is on a regimen of Breztri and other inhalers.           03/09/2023    2:09 PM 02/06/2023    9:32 AM 08/18/2022    1:09 PM  Depression screen PHQ 2/9  Decreased Interest 0 1 0  Down, Depressed, Hopeless 0 0 0  PHQ - 2 Score 0 1 0  Altered sleeping 0 0 0  Tired, decreased energy 3 1 0  Change in appetite 1 0 0  Feeling bad or failure about yourself  0 0 0  Trouble concentrating 1 0 0  Moving slowly or fidgety/restless 0 0  0  Suicidal thoughts 0 0 0  PHQ-9 Score 5 2 0  Difficult doing work/chores Somewhat difficult Not difficult at all Not difficult at all    Social History   Tobacco Use   Smoking status: Former    Current packs/day: 0.00    Average packs/day: 1 pack/day for 8.0 years (8.0 ttl pk-yrs)    Types: Cigarettes    Start date: 12/31/1959    Quit date: 12/31/1967    Years since quitting: 55.2   Smokeless tobacco: Former  Building services engineer status: Never Used  Substance Use Topics   Alcohol use: No    Alcohol/week: 0.0 standard drinks of alcohol    Comment: 05/06/2012 "last alcohol several years ago; never had problem wit"   Drug use: No    Review of Systems Per HPI unless specifically indicated above     Objective:    BP 110/70   Pulse 85   Ht 5\' 11"  (1.803 m)   Wt 193 lb (87.5 kg)   SpO2 93%   BMI 26.92 kg/m   Wt Readings from Last 3 Encounters:  03/09/23 193 lb (87.5 kg)  02/06/23 193 lb (87.5 kg)  10/16/22 200 lb (90.7 kg)    Physical Exam Vitals and nursing note reviewed.  Constitutional:      General: He is not in acute distress.    Appearance: He is well-developed. He  is not diaphoretic.     Comments: Well-appearing, comfortable, cooperative  HENT:     Head: Normocephalic and atraumatic.  Eyes:     General:        Right eye: No discharge.        Left eye: No discharge.     Conjunctiva/sclera: Conjunctivae normal.  Neck:     Thyroid: No thyromegaly.  Cardiovascular:     Rate and Rhythm: Normal rate and regular rhythm.     Pulses: Normal pulses.     Heart sounds: Normal heart sounds. No murmur heard. Pulmonary:     Effort: Pulmonary effort is normal. No respiratory distress.     Breath sounds: Wheezing present. No rales.     Comments: Reduced air movement cough Musculoskeletal:        General: Normal range of motion.     Cervical back: Normal range of motion and neck supple.  Lymphadenopathy:     Cervical: No cervical adenopathy.  Skin:    General: Skin  is warm and dry.     Findings: No erythema or rash.  Neurological:     Mental Status: He is alert and oriented to person, place, and time. Mental status is at baseline.  Psychiatric:        Behavior: Behavior normal.     Comments: Well groomed, good eye contact, normal speech and thoughts      Results for orders placed or performed in visit on 03/09/23  POC COVID-19  Result Value Ref Range   SARS Coronavirus 2 Ag Negative Negative  POCT Influenza A/B  Result Value Ref Range   Influenza A, POC Negative Negative   Influenza B, POC Negative Negative      Assessment & Plan:   Problem List Items Addressed This Visit     Centrilobular emphysema (HCC)   Relevant Medications   predniSONE (DELTASONE) 20 MG tablet   guaiFENesin-codeine (ROBITUSSIN AC) 100-10 MG/5ML syrup   Other Visit Diagnoses     Acute bronchitis with COPD (HCC)    -  Primary   Relevant Medications   predniSONE (DELTASONE) 20 MG tablet   levofloxacin (LEVAQUIN) 500 MG tablet   guaiFENesin-codeine (ROBITUSSIN AC) 100-10 MG/5ML syrup   Cough, unspecified type       Relevant Orders   POC COVID-19 (Completed)   POCT Influenza A/B (Completed)       Acute COPD / Emphysema exacerbation Persistent cough and congestion despite previous treatment with steroids and antibiotics. Noted exacerbation with outdoor air exposure and difficulty sleeping due to cough. Lung exam reveals significant wheezing and rattling. -Order 7-day course of Prednisone taper + Start taking Levaquin antibiotic 500mg  daily x 7 days -Prescribe cough syrup with codeine for nighttime use to suppress cough and aid sleep. -Continue use of Breztri and nebulizer twice daily. -Advise patient to stay upright as much as possible to aid in congestion relief.  He should follow up with Pulmonology if worsening respiratory concerns.  Hypotension Episodes of low blood pressure, most recent reading 77/55. Patient has stopped taking Losartan due to these  episodes. -Advise patient to remain off Losartan. -Monitor blood pressure during steroid treatment as steroids can cause blood pressure to rise. He can follow up with his Cardiology if still difficulty with Low BP readings.  Follow-up Monitor response to treatment and reassess as needed.         Meds ordered this encounter  Medications   predniSONE (DELTASONE) 20 MG tablet    Sig: Take daily with  food. Start with 60mg  (3 pills) x 2 days, then reduce to 40mg  (2 pills) x 2 days, then 20mg  (1 pill) x 3 days    Dispense:  13 tablet    Refill:  0   levofloxacin (LEVAQUIN) 500 MG tablet    Sig: Take 1 tablet (500 mg total) by mouth daily. For 7 days    Dispense:  7 tablet    Refill:  0   guaiFENesin-codeine (ROBITUSSIN AC) 100-10 MG/5ML syrup    Sig: Take 5-10 mLs by mouth at bedtime as needed for cough.    Dispense:  118 mL    Refill:  0      Follow up plan: Return if symptoms worsen or fail to improve.   Saralyn Pilar, DO [facility] 03/09/2023, 2:18 PM

## 2023-03-09 NOTE — Telephone Encounter (Signed)
Chief Complaint: SOB Symptoms: Cough w/green phlegm, SOB more than normal especially at night lying flat (start coughing and phlegm goes up in throat), chest tightness w/congestion 1-2 weeks Frequency: onset 1-2 weeks Pertinent Negatives: Patient denies other symptoms Disposition: [] ED /[] Urgent Care (no appt availability in office) / [x] Appointment(In office/virtual)/ []  [facility]/ [] Home Care/ [] Refused Recommended Disposition /[]  Mobile Bus/ []  Follow-up with PCP Additional Notes: Patient says he's been having chest congestion and noticed he's becoming more SOB, especially at night lying flat. He says he's using his nebulizer more often. He says in the last month he noticed his BP dropping and getting weak, it was 77/55. He cut out the losartan to see if it would help. He says on Wednesday it dropped again. Advised OV, he agreed. Scheduled today.    Reason for Disposition  [1] Longstanding difficulty breathing AND [2] not responding to usual therapy  Answer Assessment - Initial Assessment Questions 1. RESPIRATORY STATUS: "Describe your breathing?" (e.g., wheezing, shortness of breath, unable to speak, severe coughing)      Shortness of breath, coughing 2. ONSET: "When did this breathing problem begin?"      1-2 weeks 3. PATTERN "Does the difficult breathing come and go, or has it been constant since it started?"      Constant 4. SEVERITY: "How bad is your breathing?" (e.g., mild, moderate, severe)    - MILD: No SOB at rest, mild SOB with walking, speaks normally in sentences, can lie down, no retractions, pulse < 100.    - MODERATE: SOB at rest, SOB with minimal exertion and prefers to sit, cannot lie down flat, speaks in phrases, mild retractions, audible wheezing, pulse 100-120.    - SEVERE: Very SOB at rest, speaks in single words, struggling to breathe, sitting hunched forward, retractions, pulse > 120      Moderate 5. RECURRENT SYMPTOM: "Have you had  difficulty breathing before?" If Yes, ask: "When was the last time?" and "What happened that time?"      Yes has COPD 6. LUNG HISTORY: "Do you have any history of lung disease?"  (e.g., pulmonary embolus, asthma, emphysema)     COPD 7. CAUSE: "What do you think is causing the breathing problem?"      Chest congestion 9. OTHER SYMPTOMS: "Do you have any other symptoms? (e.g., dizziness, runny nose, cough, chest pain, fever)     Cough w/thick greenish-yellow mucus, neck sore due to coughing, tightness in chest from chest congestion x 2 weeks 10. O2 SATURATION MONITOR:  "Do you use an oxygen saturation monitor (pulse oximeter) at home?" If Yes, ask: "What is your reading (oxygen level) today?" "What is your usual oxygen saturation reading?" (e.g., 95%)       90's  Protocols used: Breathing Difficulty-A-AH

## 2023-04-18 ENCOUNTER — Other Ambulatory Visit: Payer: Self-pay | Admitting: Family Medicine

## 2023-04-18 DIAGNOSIS — E1142 Type 2 diabetes mellitus with diabetic polyneuropathy: Secondary | ICD-10-CM

## 2023-04-18 NOTE — Telephone Encounter (Signed)
Requested Prescriptions  Pending Prescriptions Disp Refills   gabapentin (NEURONTIN) 100 MG capsule [Pharmacy Med Name: Gabapentin 100 MG Oral Capsule] 270 capsule 0    Sig: TAKE 1 CAPSULE BY MOUTH THREE TIMES DAILY     Neurology: Anticonvulsants - gabapentin Passed - 04/18/2023  2:54 PM      Passed - Cr in normal range and within 360 days    Creat  Date Value Ref Range Status  08/07/2022 0.99 0.70 - 1.28 mg/dL Final   Creatinine, Urine  Date Value Ref Range Status  08/07/2022 84 20 - 320 mg/dL Final         Passed - Completed PHQ-2 or PHQ-9 in the last 360 days      Passed - Valid encounter within last 12 months    Recent Outpatient Visits           1 month ago Acute bronchitis with COPD [facility])   [place] [facility] [place], Netta Neat, DO   1 month ago Type 2 diabetes mellitus with peripheral neuropathy [facility])   [place] [facility] Delles, Gentry Fitz A, RPH-CPP   2 months ago Type 2 diabetes mellitus with peripheral neuropathy [facility])   [place] [facility] [place], Netta Neat, DO   2 months ago Type 2 diabetes mellitus with peripheral neuropathy [facility])   [place] [facility] Delles, Gentry Fitz A, RPH-CPP   4 months ago Type 2 diabetes mellitus with peripheral neuropathy [facility])   [place] [facility] Delles, Jackelyn Poling, RPH-CPP       Future Appointments             In 3 months Furth, [name], [name] [facility] [place]   In 3 months Althea Charon, Netta Neat, DO [place] [facility]

## 2023-04-20 ENCOUNTER — Other Ambulatory Visit: Payer: Medicare Other | Admitting: Pharmacist

## 2023-04-20 DIAGNOSIS — E1169 Type 2 diabetes mellitus with other specified complication: Secondary | ICD-10-CM

## 2023-04-20 DIAGNOSIS — J432 Centrilobular emphysema: Secondary | ICD-10-CM

## 2023-04-20 NOTE — Progress Notes (Unsigned)
04/20/2023 Name: Philip TORGERSON Sr. MRN: [id number] DOB: Jan 20, 1946  Chief Complaint  Patient presents with   Medication Management    Philip Scarce. is a 77 y.o. year old male who presented for a telephone visit.   They were referred to the pharmacist by their PCP for assistance in managing diabetes, hypertension, hyperlipidemia, and medication access.    Subjective:   Care Team: Primary Care Provider: Smitty Cords, DO; Next Scheduled Visit: 08/14/2023 Cardiologist: [name], MD Pulmonologist: Mertie Moores, MD; Next Scheduled Visit: 07/25/2023 Orthopedics: Francisco Capuchin, MD    Medication Access/Adherence  Current Pharmacy:  [facility] [address] ([address]), [place] - [address] [place]) [place] [address] Phone: [phone] Fax: [phone]   Patient reports affordability concerns with their medications: No  Patient reports access/transportation concerns to their pharmacy: No  Patient reports adherence concerns with their medications:  No     Patient uses weekly pillbox   Type 2 Diabetes:   Current medications: metformin 1,000 mg twice daily Farxiga 10 mg - 1 tablet QAM     Current glucose readings:    Fasting Blood Glucose After Supper/Bedtime  13 - December 233 210  14 - December 169 188  15 - December 153 145  16 - December 136 131  17 - December 140 -  18 - December - -  19 - December - 268  20 - December 128   *Attributes elevated readings to recent portion sizes of pastas and sweets (brownie)   Reports using blood sugar readings as feedback on dietary choices   Denies symptoms of hypoglycemia   Current medication access support: enrolled in patient assistance for Farxiga from [employer] through 04/30/2024     CHF/Hypertension/ Atrial Fibrillation:    Current medications:  Metoprolol 75 mg twice daily Spironolactone 25 mg - 1/2 tablet (12.5 mg) daily  Farxiga 10 mg  - 1 tablets daily Furosemide 40 mg daily only as needed for edema as directed by Cardiologist   Anticoagulation: Eliquis 5 mg twice daily     Patient has an automated, upper arm home BP cuff Current blood pressure readings readings:     AM BP PM BP  15 - December 107/69, HR 72 114/75, HR 86  16 - December 110/76, HR 82 102/76, HR 75  17 - December 104/71, HR 70 -  18 - December - 105/69, HR 65  19 - December - -  20 - December 116/77, HR 81      Denies symptoms of hypotension such as lightheadedness or dizziness   Confirms monitoring home weight daily as directed by Cardiology    Current medication access support: Reports has decided not to complete application for Eliquis patient assistance application for current calendar year as has sufficient supply of medication to last through the end of the calendar year    COPD/seasonal allergies: Patient follows with Dr. Meredeth Ide at [facility]   Current medications: Breztri 2 puffs twice daily Montelukast 10 mg QHS Albuterol inhaler as needed Cetirizine 10 mg daily Flonase nasal spray as needed     Confirms using Breztri inhaler consistently as directed and rinsing mouth out after each use   Current medication access support: Enrolled in [place] patient assistance program through [employer] through 04/30/2024   Objective:  Lab Results  Component Value Date   HGBA1C 6.6 (A) 02/06/2023    Lab Results  Component Value Date   CREATININE 0.99  08/07/2022   BUN 18 08/07/2022   NA 144 08/07/2022   K 4.6 08/07/2022   CL 106 08/07/2022   CO2 30 08/07/2022    Lab Results  Component Value Date   CHOL 120 08/07/2022   HDL 56 08/07/2022   LDLCALC 48 08/07/2022   TRIG 75 08/07/2022   CHOLHDL 2.1 08/07/2022   BP Readings from Last 3 Encounters:  03/09/23 110/70  02/06/23 104/60  10/16/22 122/72   Pulse Readings from Last 3 Encounters:  03/09/23 85  02/06/23 61  10/16/22 85    Medications Reviewed Today      Reviewed by Manuela Neptune, RPH-CPP (Pharmacist) on 04/20/23 at 1014  Med List Status: <None>   Medication Order Taking? Sig Documenting Provider Last Dose Status Informant  acetaminophen (TYLENOL) 500 MG tablet [id number]  Take 1,000 mg by mouth every 8 (eight) hours as needed for moderate pain or mild pain. [provider]  Active Self  Albuterol Sulfate 108 (90 Base) MCG/ACT AEPB [id number]  Inhale 2 puffs into the lungs every 6 (six) hours as needed (shortness of breath).  [provider]  Active Self  apixaban (ELIQUIS) 5 MG TABS tablet [id number] Yes Take 1 tablet by mouth twice daily [name], MD Taking Active   ARTIFICIAL TEAR SOLUTION OP [id number]  Place 1 drop into both eyes daily. [provider]  Active Self  atorvastatin (LIPITOR) 40 MG tablet [id number]  Take 1 tablet by mouth once daily [name], MD  Active   Blood Glucose Monitoring Suppl (ONE TOUCH ULTRA 2) w/Device KIT [id number]  Use to check blood sugar as advised up to twice a day Smitty Cords, DO  Active Self  BREZTRI AEROSPHERE 160-9-4.8 MCG/ACT Sandrea Matte [id number] Yes Inhale 2 puffs into the lungs 2 (two) times daily. Smitty Cords, DO Taking Active   calcium carbonate (TUMS - DOSED IN MG ELEMENTAL CALCIUM) 500 MG chewable tablet [id number]  Chew 1,000 mg by mouth daily as needed for indigestion or heartburn. [provider]  Active Self  cetirizine (ZYRTEC) 10 MG tablet [id number]  Take 10 mg by mouth daily. [provider]  Active Self  cyanocobalamin (VITAMIN B12) 1000 MCG tablet [id number]  Take 1,000 mcg by mouth daily. [provider]  Active   FARXIGA 10 MG TABS tablet [id number] Yes Take 1 tablet (10 mg total) by mouth daily before breakfast. Smitty Cords, DO Taking Active Self  fluticasone (FLONASE) 50 MCG/ACT nasal spray [id number]  Place 2 sprays into both nostrils daily. Althea Charon, Netta Neat, DO  Active    furosemide (LASIX) 40 MG tablet [id number]  TAKE 1/2 TO 1 (ONE-HALF TO ONE) TABLET BY MOUTH ONCE DAILY AS NEEDED FOR EDEMA. ONLY AS NEEDED.  Patient not taking: Reported on 03/09/2023   Smitty Cords, DO  Active   gabapentin (NEURONTIN) 100 MG capsule [id number]  TAKE 1 CAPSULE BY MOUTH THREE TIMES DAILY Althea Charon, Netta Neat, DO  Active   ipratropium-albuterol (DUONEB) 0.5-2.5 (3) MG/3ML SOLN [id number] Yes Take 3 mLs by nebulization every 6 (six) hours as needed (shortness of breath). [provider] Taking Active Self           Med Note Joella Prince A   Fri Dec 24, 2020  4:04 PM)    Tollie Eth THIN MISC [id number]  1 Device by Does not apply route 4 (four) times daily -  before meals and at bedtime. Delight Ovens, MD  Active Self  levothyroxine (SYNTHROID) [phone]  MCG tablet [id number]  Take 1 tablet (88 mcg total) by mouth daily before breakfast. Smitty Cords, DO  Active   metFORMIN (GLUCOPHAGE) 1000 MG tablet [id number]  Take 1 tablet (1,000 mg total) by mouth 2 (two) times daily with a meal. Althea Charon, Netta Neat, DO  Active   metoprolol tartrate (LOPRESSOR) 50 MG tablet [id number] Yes TAKE ONE AND ONE-HALF TABLETS BY MOUTH TWICE DAILY [name], MD Taking Active   montelukast (SINGULAIR) 10 MG tablet [id number] Yes Take 1 tablet by mouth at bedtime. [provider] Taking Active   [facility] [place] ULTRA test strip [id number]  USE TO CHECK GLUCOSE TWICE DAILY Smitty Cords, DO  Active   spironolactone (ALDACTONE) 25 MG tablet [id number] Yes Take 1/2 (one-half) tablet by mouth once daily [name], MD Taking Active               Assessment/Plan:   Outreach to [employer] on behalf of patient. Speak with representative Valeria. Confirm re-enrolled in assistance program for [place] for 2025 - Send renewal of Farxiga and [place] prescriptions to program    COPD/Seasonal Allergies: - Reviewed appropriate inhaler  technique - Patient to follow up with [employer] assistance program as needed regarding refills of Breztri inhaler   Diabetes: - Counseled on importance of continuing to control carbohydrate portion sizes and using blood sugar checks to reinforce dietary choices - Encourage patient to limit sweets and sugary beverages (sweet tea, Pepsi) - Recommend to continue to check glucose, keep log of results and to bring this record to medical appointments - Patient to follow up with [employer] assistance program as needed regarding refills of Farxiga   CHF/Hypertension/Atrial Fibrillation: - Encourage patient to continue to weigh daily and and follow directions from Cardiologist for use of PRN furosemide (Lasix).  - Recommended to continue to check home blood pressure and heart rate, keep log of results and contact Cardiologist for BP and HR if needed for readings outside of established parameters or symptoms     Follow Up Plan:    Clinical Pharmacist will follow up with patient by telephone on 06/01/2023 at 9:00 AM      [name], PharmD, Patsy Baltimore, CPP Clinical Pharmacist [facility] [phone]

## 2023-04-23 MED ORDER — BREZTRI AEROSPHERE 160-9-4.8 MCG/ACT IN AERO: 2.0000 | INHALATION_SPRAY | Freq: Two times a day (BID) | RESPIRATORY_TRACT | 3 refills | Status: AC

## 2023-04-23 MED ORDER — FARXIGA 10 MG PO TABS: 10.0000 mg | ORAL_TABLET | Freq: Every day | ORAL | 3 refills | Status: AC

## 2023-04-23 NOTE — Patient Instructions (Signed)
Goals Addressed             This Visit's Progress    Pharmacy Goals       Our goal A1c is less than 7%. This corresponds with fasting sugars less than 130 and 2 hour after meal sugars less than 180. Please check your blood sugar and keep log of results  Please continue to monitor your home weight, blood pressure and heart rate  Our goal bad cholesterol, or LDL, is less than 70 . This is why it is important to continue taking your atorvastatin  Feel free to call me with any questions or concerns. I look forward to our next call!    [name], PharmD, [facility] Clinical Pharmacist [facility] [phone]

## 2023-05-01 ENCOUNTER — Other Ambulatory Visit: Payer: Self-pay | Admitting: Cardiovascular Disease

## 2023-05-15 ENCOUNTER — Other Ambulatory Visit: Payer: Self-pay | Admitting: Cardiovascular Disease

## 2023-05-25 DIAGNOSIS — L578 Other skin changes due to chronic exposure to nonionizing radiation: Secondary | ICD-10-CM | POA: Diagnosis not present

## 2023-05-25 DIAGNOSIS — D485 Neoplasm of uncertain behavior of skin: Secondary | ICD-10-CM | POA: Diagnosis not present

## 2023-05-25 DIAGNOSIS — D0439 Carcinoma in situ of skin of other parts of face: Secondary | ICD-10-CM | POA: Diagnosis not present

## 2023-05-25 DIAGNOSIS — L570 Actinic keratosis: Secondary | ICD-10-CM | POA: Diagnosis not present

## 2023-05-29 ENCOUNTER — Other Ambulatory Visit: Admission: RE | Admit: 2023-05-29 | Payer: Medicare Other | Source: Ambulatory Visit | Admitting: *Deleted

## 2023-05-29 ENCOUNTER — Ambulatory Visit
Admission: RE | Admit: 2023-05-29 | Discharge: 2023-05-29 | Disposition: A | Discharge status: Home / Self Care | Payer: Medicare Other | Source: Ambulatory Visit | Attending: Medical | Admitting: Medical

## 2023-05-29 ENCOUNTER — Ambulatory Visit
Admission: RE | Admit: 2023-05-29 | Discharge: 2023-05-29 | Disposition: A | Discharge status: Home / Self Care | Payer: Medicare Other | Source: Ambulatory Visit | Attending: Medical | Admitting: *Deleted

## 2023-05-29 ENCOUNTER — Telehealth: Payer: Self-pay | Admitting: Cardiovascular Disease

## 2023-05-29 ENCOUNTER — Ambulatory Visit: Payer: Medicare Other | Attending: Medical | Admitting: Medical

## 2023-05-29 ENCOUNTER — Encounter: Payer: Self-pay | Admitting: Medical

## 2023-05-29 VITALS — BP 126/72 | HR 88 | Ht 71.0 in | Wt 188.0 lb

## 2023-05-29 DIAGNOSIS — R0602 Shortness of breath: Secondary | ICD-10-CM | POA: Insufficient documentation

## 2023-05-29 DIAGNOSIS — I4821 Permanent atrial fibrillation: Secondary | ICD-10-CM | POA: Diagnosis not present

## 2023-05-29 DIAGNOSIS — I5022 Chronic systolic (congestive) heart failure: Secondary | ICD-10-CM | POA: Diagnosis not present

## 2023-05-29 DIAGNOSIS — I25118 Atherosclerotic heart disease of native coronary artery with other forms of angina pectoris: Secondary | ICD-10-CM

## 2023-05-29 DIAGNOSIS — Z79899 Other long term (current) drug therapy: Secondary | ICD-10-CM

## 2023-05-29 DIAGNOSIS — J449 Chronic obstructive pulmonary disease, unspecified: Secondary | ICD-10-CM | POA: Diagnosis not present

## 2023-05-29 MED ORDER — SPIRONOLACTONE 25 MG PO TABS: 12.5000 mg | ORAL_TABLET | Freq: Every day | ORAL | 0 refills | Status: DC

## 2023-05-29 NOTE — Telephone Encounter (Signed)
Requested Prescriptions   Signed Prescriptions Disp Refills   spironolactone (ALDACTONE) 25 MG tablet 45 tablet 0    Sig: Take 0.5 tablets (12.5 mg total) by mouth daily.    Authorizing Provider: [name] A    Ordering User: Kendrick Fries

## 2023-05-29 NOTE — Telephone Encounter (Signed)
*  STAT* If patient is at the pharmacy, call can be transferred to refill team.   1. Which medications need to be refilled? (please list name of each medication and dose if known)  spironolactone (ALDACTONE) 25 MG tablet  2. Which pharmacy/location (including street and city if local pharmacy) is medication to be sent to? [id number] - [place] ([place]), [place] - [address]  3. Do they need a 30 day or 90 day supply?   90 day supply

## 2023-05-29 NOTE — Telephone Encounter (Signed)
The patient called c/o SOB  for the past 2 months. The patient denies swelling or significant weight gain. He stated that SOB initially occurred with walking but has since progressed to occurring at rest. The patient also mentioned that during his annual [facility] nurse visit, the nurse noted his lungs were clear but could hear crackles as he breathed.  Appointment scheduled for today 05/29/23 for further evaluations.

## 2023-05-29 NOTE — Patient Instructions (Signed)
Medication Instructions:  Your physician recommends that you continue on your current medications as directed. Please refer to the Current Medication list given to you today.   *If you need a refill on your cardiac medications before your next appointment, please call your pharmacy*   Lab Work: Your provider would like for you to have following labs drawn today (BMP, CBC, BNP, TSH).     Testing/Procedures: Your provider has ordered a chest X-Ray for you. You can have this done at the [facility] medical mall. You do not need an appointment. Please go to the entrance of the Medical Mall and check in at the front desk.  Your physician has requested that you have an echocardiogram. Echocardiography is a painless test that uses sound waves to create images of your heart. It provides your doctor with information about the size and shape of your heart and how well your heart's chambers and valves are working.   You may receive an ultrasound enhancing agent through an IV if needed to better visualize your heart during the echo. This procedure takes approximately one hour.  There are no restrictions for this procedure.  This will take place at [address] [facility]) [address], [place] [address]  Please note: We ask at that you not bring children with you during ultrasound (echo/ vascular) testing. Due to room size and safety concerns, children are not allowed in the ultrasound rooms during exams. Our front office staff cannot provide observation of children in our lobby area while testing is being conducted. An adult accompanying a patient to their appointment will only be allowed in the ultrasound room at the discretion of the ultrasound technician under special circumstances. We apologize for any inconvenience.    Follow-Up: At [facility], you and your health needs are our priority.  As part of our continuing mission to provide you with exceptional heart care, we have created  designated Provider Care Teams.  These Care Teams include your primary Cardiologist (physician) and Advanced Practice Providers (APPs -  Physician Assistants and Nurse Practitioners) who all work together to provide you with the care you need, when you need it.  We recommend signing up for the patient portal called "MyChart".  Sign up information is provided on this After Visit Summary.  MyChart is used to connect with patients for Virtual Visits (Telemedicine).  Patients are able to view lab/test results, encounter notes, upcoming appointments, etc.  Non-urgent messages can be sent to your provider as well.   To learn more about what you can do with MyChart, go to [email or web address].    Your next appointment:   3 month(s)  Provider:   You may see [name], MD or one of the following Advanced Practice Providers on your designated Care Team:   Nicolasa Ducking, NP Eula Listen, [name] Cadence Fransico Michael, [name] Charlsie Quest, NP Carlos Levering, NP

## 2023-05-29 NOTE — Progress Notes (Signed)
Cardiology Office Note:  .   Date:  05/29/2023  ID:  Philip Snow Sr., DOB 1945/05/15, MRN [id number] PCP: Smitty Cords, DO  [facility] Providers Cardiologist:  [name], MD {   History of Present Illness: .   Philip Snow Sr. is a 78 y.o. male  with a hx of CAD s/p CABG, chronic systolic heart failure, chronic afib, atrial tachycardia who presents for follow-up.   The patient underwent CABG in January 2014. He had post-op afib which was treated with amiodarone.    He was hospitalized in February 2021 with sepsis secondary to PNA and MSSA bacteremia complicated by atrial flutter with RVR. Echo showed LVEF 40-45% with global HK, mildly reduced RV function, moderate pulmonary HTN, mild to moderate MR. TEE showed an EF of 30-35% with no evidence of vegetations.    He had a Lexiscan Myoview in March of 2021 which showed no evidence of ischemia. Echo in October 2022 showed an EF 40-45% with normal LV function.   Today, the patient reports he has been having shortness of breath for the last month or two. Also reports severe cough and congestion. Home Health nurse mentioned crackles at the bases. Also feels fatigued. No chest pain or lower leg edema. He takes lasix as needed. Weight has been going down.  He denies fever or chills. He has COPD. He started lasix this week, but feels breathing is not much different.   Studies Reviewed: [name]   EKG Interpretation Date/Time:  Tuesday May 29 2023 15:08:50 EST Ventricular Rate:  88 PR Interval:    QRS Duration:  90 QT Interval:  384 QTC Calculation: 464 R Axis:   25  Text Interpretation: Atrial fibrillation Low voltage QRS Nonspecific ST and T wave abnormality Prolonged QT When compared with ECG of 26-Jun-2019 21:42, PREVIOUS ECG IS PRESENT Confirmed by Fransico Michael, Aleks Nawrot ([id number]) on 05/29/2023 3:11:00 PM   Echo limited 01/2021  1. Left ventricular ejection fraction, by estimation, is 40 to 45%. The  left ventricle has  mildly decreased function. The left ventricle  demonstrates global hypokinesis. Left ventricular diastolic parameters are  indeterminate.   2. Right ventricular systolic function is normal. The right ventricular  size is normal.   3. The mitral valve is normal in structure. Trivial mitral valve  regurgitation. No evidence of mitral stenosis.   4. The aortic valve is normal in structure. Aortic valve regurgitation is  not visualized. No aortic stenosis is present.   5. The inferior vena cava is normal in size with greater than 50%  respiratory variability, suggesting right atrial pressure of 3 mmHg.    Echo 08/2019   1. Left ventricular ejection fraction, by estimation, is 40 to 45%. The  left ventricle has mildly decreased function. The left ventricle  demonstrates global hypokinesis. Left ventricular diastolic parameters are  indeterminate.   2. Right ventricular systolic function is normal. The right ventricular  size is normal. Tricuspid regurgitation signal is inadequate for assessing  PA pressure.   3. Left atrial size was moderately dilated.   4. The mitral valve is normal in structure. Moderate mitral valve  regurgitation. No evidence of mitral stenosis.   5. Tricuspid valve regurgitation is mild to moderate.    Myoview Lexiscan 06/2019 Narrative & Impression  The study is normal. This is a low risk study. The left ventricular ejection fraction is normal (63%). There is no evidence for ischemia  Physical Exam:   VS:  BP 126/72 (BP Location: Left Arm, Patient Position: Sitting, Cuff Size: Normal)   Pulse 88   Ht 5\' 11"  (1.803 m)   Wt 188 lb (85.3 kg)   SpO2 95%   BMI 26.22 kg/m    Wt Readings from Last 3 Encounters:  05/29/23 188 lb (85.3 kg)  03/09/23 193 lb (87.5 kg)  02/06/23 193 lb (87.5 kg)    GEN: Well nourished, well developed in no acute distress NECK: No JVD; No carotid bruits CARDIAC: RRR, no murmurs, rubs, gallops RESPIRATORY:  +rales,  wheezing and rhonchi  ABDOMEN: Soft, non-tender, non-distended EXTREMITIES:  No edema; No deformity   ASSESSMENT AND PLAN: .    SOB/cough Patient reports SOB for the last 2 months. He denies fever or chills. Reports frequent coughing. He has h/o COPD. He re-started lasix 40mg  this last week without much improvement in breathing. He has no LLE. Lungs with rales, rhonchi and wheezing. I will check a CXR and echocardiogram. I will check BMET, CBC, BNP and TSH. I recommended he see pulmonary  as well.   Permanent Afib He is in rate controlled Afib. Continue Eliquis for stroke ppx. CBC as above.   CAD The patient denies chest pain. No ASA given Eliquis. HE had a normal Myoview in 2021.   Chronic systolic heart failure LVEF 40-45% Patient started taking lasix this week d/t breathing issues. Does not appear volume up on exam. BNP and BMET as above. Continue Losartan, Lopressor, and [place]. Update echo as above.     Dispo: Follow-up in 3 months  Signed, Jaelynn Currier David Stall, [name]

## 2023-05-29 NOTE — Telephone Encounter (Signed)
Pt c/o Shortness Of Breath: STAT if SOB developed within the last 24 hours or pt is noticeably SOB on the phone  1. Are you currently SOB (can you hear that pt is SOB on the phone)?  No  2. How long have you been experiencing SOB?  About 1 month   3. Are you SOB when sitting or when up moving around?  Both   4. Are you currently experiencing any other symptoms?  Congestion, fatigue

## 2023-05-30 ENCOUNTER — Other Ambulatory Visit: Payer: Self-pay | Admitting: Family Medicine

## 2023-05-30 DIAGNOSIS — E1142 Type 2 diabetes mellitus with diabetic polyneuropathy: Secondary | ICD-10-CM

## 2023-05-30 LAB — BASIC METABOLIC PANEL
BUN/Creatinine Ratio: 17 (ref 10–24)
BUN: 20 mg/dL (ref 8–27)
CO2: 24 mmol/L (ref 20–29)
Calcium: 9.4 mg/dL (ref 8.6–10.2)
Chloride: 100 mmol/L (ref 96–106)
Creatinine, Ser: 1.21 mg/dL (ref 0.76–1.27)
Glucose: 171 mg/dL — ABNORMAL HIGH (ref 70–99)
Potassium: 4.6 mmol/L (ref 3.5–5.2)
Sodium: 139 mmol/L (ref 134–144)
eGFR: 62 mL/min/{1.73_m2} (ref >59)

## 2023-05-30 LAB — CBC
Hematocrit: 46.9 % (ref 37.5–51.0)
Hemoglobin: 15.7 g/dL (ref 13.0–17.7)
MCH: 29.5 pg (ref 26.6–33.0)
MCHC: 33.5 g/dL (ref 31.5–35.7)
MCV: 88 fL (ref 79–97)
Platelets: 343 10*3/uL (ref 150–450)
RBC: 5.32 x10E6/uL (ref 4.14–5.80)
RDW: 14.3 % (ref 11.6–15.4)
WBC: 10.5 10*3/uL (ref 3.4–10.8)

## 2023-05-30 LAB — TSH: TSH: 2.4 u[IU]/mL (ref 0.450–4.500)

## 2023-05-30 LAB — BRAIN NATRIURETIC PEPTIDE: BNP: 239 pg/mL — ABNORMAL HIGH (ref 0.0–100.0)

## 2023-05-30 NOTE — Telephone Encounter (Signed)
Requested Prescriptions  Pending Prescriptions Disp Refills   ONETOUCH ULTRA test strip [Pharmacy Med Name: OneTouch Ultra Blue In Vitro Strip] 200 each 0    Sig: USE TO CHECK GLUCOSE TWICE DAILY     Endocrinology: Diabetes - Testing Supplies Passed - 05/30/2023 12:00 PM      Passed - Valid encounter within last 12 months    Recent Outpatient Visits           2 months ago Acute bronchitis with COPD [facility])   [place] [facility] [place], Netta Neat, DO   3 months ago Type 2 diabetes mellitus with peripheral neuropathy [facility])   [facility] Delles, Gentry Fitz A, RPH-CPP   3 months ago Type 2 diabetes mellitus with peripheral neuropathy [facility])   [place] [facility] [place], Netta Neat, DO   4 months ago Type 2 diabetes mellitus with peripheral neuropathy [facility])   [place] [facility] Delles, Gentry Fitz A, RPH-CPP   6 months ago Type 2 diabetes mellitus with peripheral neuropathy [facility])   [place] [facility] Delles, Jackelyn Poling, RPH-CPP       Future Appointments             In 2 months Furth, [name], [name] [facility] [place]   In 2 months Althea Charon, Netta Neat, DO [facility]

## 2023-05-31 ENCOUNTER — Other Ambulatory Visit: Payer: Self-pay

## 2023-05-31 DIAGNOSIS — I5020 Unspecified systolic (congestive) heart failure: Secondary | ICD-10-CM

## 2023-05-31 MED ORDER — FUROSEMIDE 20 MG PO TABS: 20.0000 mg | ORAL_TABLET | Freq: Every day | ORAL | 3 refills | Status: AC

## 2023-06-01 ENCOUNTER — Other Ambulatory Visit: Payer: Medicare Other | Admitting: Pharmacist

## 2023-06-01 DIAGNOSIS — E1169 Type 2 diabetes mellitus with other specified complication: Secondary | ICD-10-CM

## 2023-06-01 DIAGNOSIS — J432 Centrilobular emphysema: Secondary | ICD-10-CM

## 2023-06-01 DIAGNOSIS — I129 Hypertensive chronic kidney disease with stage 1 through stage 4 chronic kidney disease, or unspecified chronic kidney disease: Secondary | ICD-10-CM

## 2023-06-01 NOTE — Patient Instructions (Signed)
Goals Addressed             This Visit's Progress    Pharmacy Goals       Our goal A1c is less than 7%. This corresponds with fasting sugars less than 130 and 2 hour after meal sugars less than 180. Please check your blood sugar and keep log of results  Please continue to monitor your home weight, blood pressure and heart rate  Our goal bad cholesterol, or LDL, is less than 70 . This is why it is important to continue taking your atorvastatin  Feel free to call me with any questions or concerns. I look forward to our next call!    [name], PharmD, [facility] Clinical Pharmacist [facility] [phone]

## 2023-06-01 NOTE — Progress Notes (Signed)
06/01/2023 Name: [name] Sr. MRN: [id number] DOB: 1945/11/19  Chief Complaint  Patient presents with   Medication Management   Medication Assistance    PIERS Philip Sr. is a 78 y.o. year old male who presented for a telephone visit.   They were referred to the pharmacist by their PCP for assistance in managing diabetes, hypertension, hyperlipidemia, and medication access.    Subjective:   Care Team: Primary Care Provider: Smitty Cords, DO; Next Scheduled Visit: 08/14/2023 Cardiologist: [name], MD; Next Scheduled Visit: 08/06/2023 Pulmonologist: Mertie Moores, MD; Next Scheduled Visit: 07/25/2023 Orthopedics: Francisco Capuchin, MD  Medication Access/Adherence  Current Pharmacy:  [facility] [address] ([address]), [place] - [address]) [place] [address] Phone: [phone] Fax: [phone]   Patient reports affordability concerns with their medications: No  Patient reports access/transportation concerns to their pharmacy: No  Patient reports adherence concerns with their medications:  No     Patient uses weekly pillbox  Reports scheduled for follow up echocardiogram as ordered by Cardiologist   Type 2 Diabetes:   Current medications: metformin 1,000 mg twice daily Farxiga 10 mg - 1 tablet QAM     Current glucose readings:    Fasting Blood Glucose After Supper/Bedtime  25 - January 164 147  26 - January 156 -  27 - January 135 214  28 - January 173 177  29 - January 138 182  30 - January 159 120  31 - January 150     *Attributes elevated readings to recent portion sizes of carbohydrates, such as pasta   Reports using blood sugar readings as feedback on dietary choices   Denies symptoms of hypoglycemia   Current medication access support: enrolled in patient assistance for Farxiga from [employer] through 04/30/2024      CHF/Hypertension/ Atrial Fibrillation:      Current medications:  Metoprolol 75 mg twice daily Spironolactone 25 mg - 1/2 tablet (12.5 mg) daily  Farxiga 10 mg - 1 tablets daily Furosemide 20 mg daily (adjusted to taking on a scheduled basis as ordered by Cardiologist on 1/30)   Anticoagulation: Eliquis 5 mg twice daily     Patient has an automated, upper arm home BP cuff Current blood pressure readings readings:     AM BP PM BP  25 - January 112/76, HR 69 106/75, HR 80  26 - January 115/81, HR 81 -  27 - January 114/84, HR 71 118/85, HR 94  28 - January 112/71, HR 71 109/80, HR 76  29 - January 114/73, HR 70 99/70, HR 92  30 - January 111/77, HR 71 107/73, HR 69  31 - January 111/74, HR 77      Denies symptoms of hypotension such as lightheadedness or dizziness   Confirms monitoring home weight daily as directed by Cardiology    Current medication access support: Reports has decided not to complete application for Eliquis patient assistance application for current calendar year as has sufficient supply of medication to last through the end of the calendar year     COPD/seasonal allergies: Patient follows with Dr. Meredeth Ide at [facility]mon[facility]   Current medications: Breztri 2 puffs twice daily Montelukast 10 mg QHS Albuterol inhaler as needed Cetirizine 10 mg daily Flonase nasal spray as needed     Confirms using Breztri inhaler consistently as directed and rinsing mouth out after each use   Current medication access support: Enrolled in [place]  patient assistance program through [employer] through 04/30/2024   Objective:  Lab Results  Component Value Date   HGBA1C 6.6 (A) 02/06/2023    Lab Results  Component Value Date   CREATININE 1.21 05/29/2023   BUN 20 05/29/2023   NA 139 05/29/2023   K 4.6 05/29/2023   CL 100 05/29/2023   CO2 24 05/29/2023    Lab Results  Component Value Date   CHOL 120 08/07/2022   HDL 56 08/07/2022   LDLCALC 48 08/07/2022   TRIG 75 08/07/2022   CHOLHDL 2.1  08/07/2022    Medications Reviewed Today     Reviewed by Manuela Neptune, RPH-CPP (Pharmacist) on 06/01/23 at [phone]  Med List Status: <None>   Medication Order Taking? Sig Documenting Provider Last Dose Status Informant  acetaminophen (TYLENOL) 500 MG tablet [id number]  Take 1,000 mg by mouth every 8 (eight) hours as needed for moderate pain or mild pain. [provider]  Active Self  Albuterol Sulfate 108 (90 Base) MCG/ACT AEPB [id number]  Inhale 2 puffs into the lungs every 6 (six) hours as needed (shortness of breath).  [provider]  Active Self  apixaban (ELIQUIS) 5 MG TABS tablet [id number] Yes Take 1 tablet by mouth twice daily [name], MD Taking Active   ARTIFICIAL TEAR SOLUTION OP [id number]  Place 1 drop into both eyes daily. [provider]  Active Self  atorvastatin (LIPITOR) 40 MG tablet [id number]  Take 1 tablet (40 mg total) by mouth daily. Pt needs to keep upcoming appt in April for additional refills [name], MD  Active   Blood Glucose Monitoring Suppl (ONE TOUCH ULTRA 2) w/Device KIT [id number]  Use to check blood sugar as advised up to twice a day Smitty Cords, DO  Active Self  BREZTRI AEROSPHERE 160-9-4.8 MCG/ACT Sandrea Matte [id number] Yes Inhale 2 puffs into the lungs 2 (two) times daily. Smitty Cords, DO Taking Active   calcium carbonate (TUMS - DOSED IN MG ELEMENTAL CALCIUM) 500 MG chewable tablet [id number]  Chew 1,000 mg by mouth daily as needed for indigestion or heartburn. [provider]  Active Self  cetirizine (ZYRTEC) 10 MG tablet [id number]  Take 10 mg by mouth daily. [provider]  Active Self  cyanocobalamin (VITAMIN B12) 1000 MCG tablet [id number]  Take 1,000 mcg by mouth daily. [provider]  Active   FARXIGA 10 MG TABS tablet [id number] Yes Take 1 tablet (10 mg total) by mouth daily before breakfast. Smitty Cords, DO Taking Active   fluticasone (FLONASE) 50  MCG/ACT nasal spray [id number]  Place 2 sprays into both nostrils daily. [name], Netta Neat, DO  Active   furosemide (LASIX) 20 MG tablet [id number] Yes Take 1 tablet (20 mg total) by mouth daily. Fransico Michael, [name], [name] Taking Active   gabapentin (NEURONTIN) 100 MG capsule [id number]  TAKE 1 CAPSULE BY MOUTH THREE TIMES DAILY [name], Netta Neat, DO  Active   ipratropium-albuterol (DUONEB) 0.5-2.5 (3) MG/3ML SOLN [id number]  Take 3 mLs by nebulization every 6 (six) hours as needed (shortness of breath). [provider]  Active Self           Med Note Joella Prince A   Fri Dec 24, 2020  4:04 PM)    Tollie Eth THIN MISC [id number]  1 Device by Does not apply route 4 (four) times daily -  before meals and at bedtime. Delight Ovens, MD  Active Self  levothyroxine (SYNTHROID) 88 MCG tablet [id number]  Take 1 tablet (88 mcg total) by mouth daily before breakfast. Smitty Cords, DO  Active   metFORMIN (GLUCOPHAGE) 1000 MG tablet [id number] Yes Take 1 tablet (1,000 mg total) by mouth 2 (two) times daily with a meal. Althea Charon, Netta Neat, DO Taking Active   metoprolol tartrate (LOPRESSOR) 50 MG tablet [id number] Yes TAKE 1 & 1/2 (ONE & ONE-HALF) TABLETS BY MOUTH TWICE DAILY [name], MD Taking Active   montelukast (SINGULAIR) 10 MG tablet [id number]  Take 1 tablet by mouth at bedtime. [provider]  Active   Koren Bound test strip [id number]  USE TO CHECK GLUCOSE TWICE DAILY Smitty Cords, DO  Active   spironolactone (ALDACTONE) 25 MG tablet [id number] Yes Take 0.5 tablets (12.5 mg total) by mouth daily. [name], MD Taking Active               Assessment/Plan:   Provide counseling on changes to Medicare prescription coverage for 2025. Per review of [employer], note that patient has a $420 annual deductible (impacting tiers 3-5) for 2025 calendar year  Encourage patient to follow up with Pulmonologist regarding  is breathing, as recommended by Cardiologist  COPD/Seasonal Allergies: - Reviewed appropriate inhaler technique - Patient to follow up with [employer] assistance program as needed regarding refills of Breztri inhaler   Diabetes: - Counseled on importance of continuing to control carbohydrate portion sizes and using blood sugar checks to reinforce dietary choices - Encourage patient to limit sweets and sugary beverages (sweet tea, Pepsi) - Recommend to continue to check glucose, keep log of results and to bring this record to medical appointments - Patient to follow up with [employer] assistance program as needed regarding refills of Farxiga   CHF/Hypertension/Atrial Fibrillation: - Encourage patient to continue to weigh daily - Recommended to continue to check home blood pressure and heart rate, keep log of results and contact Cardiologist for BP and HR if needed for readings outside of established parameters or symptoms      Follow Up Plan:    Clinical Pharmacist will follow up with patient by telephone on 07/09/2023 at 9:00 AM      [name], PharmD, Patsy Baltimore, CPP Clinical Pharmacist [facility] [phone]

## 2023-06-09 ENCOUNTER — Other Ambulatory Visit: Payer: Self-pay | Admitting: Cardiovascular Disease

## 2023-06-14 ENCOUNTER — Ambulatory Visit: Payer: Medicare Other | Attending: Medical

## 2023-06-14 DIAGNOSIS — R0602 Shortness of breath: Secondary | ICD-10-CM | POA: Diagnosis not present

## 2023-06-14 LAB — ECHOCARDIOGRAM COMPLETE
AR max vel: 2.71 cm2
AV Area VTI: 2.17 cm2
AV Area mean vel: 2.6 cm2
AV Mean grad: 2 mm[Hg]
AV Peak grad: 4.5 mm[Hg]
Ao pk vel: 1.06 m/s
Calc EF: 43.2 %
S' Lateral: 3.1 cm
Single Plane A2C EF: 40.8 %
Single Plane A4C EF: 44.2 %

## 2023-06-18 ENCOUNTER — Ambulatory Visit: Payer: Medicare Other | Admitting: Internal Medicine

## 2023-06-18 ENCOUNTER — Encounter: Payer: Self-pay | Admitting: Internal Medicine

## 2023-06-18 VITALS — BP 124/70 | Ht 71.0 in | Wt 182.4 lb

## 2023-06-18 DIAGNOSIS — M25532 Pain in left wrist: Secondary | ICD-10-CM

## 2023-06-18 MED ORDER — PREDNISONE 10 MG PO TABS: 10.0000 mg | ORAL_TABLET | Freq: Every day | ORAL | 0 refills | Status: DC

## 2023-06-18 NOTE — Progress Notes (Signed)
Subjective:    Patient ID: Philip Spitz., male    DOB: 1945-10-27, 78 y.o.   MRN: [id number]  HPI  Discussed the use of AI scribe software for clinical note transcription with the patient, who gave verbal consent to proceed.  Philip SANGER Sr. is a 78 year old male with diabetes who presents with wrist pain. He is accompanied by his wife and kids.  He has been experiencing left wrist pain since Friday, describing it as both sore and achy, as well as sharp and stabbing, depending on wrist movement. The pain started in the wrist and progressed to the elbow, though it did not extend past the elbow or involve the elbow itself. Some swelling is noted but no redness or warmth. No numbness, tingling, or weakness in the fingertips. There is no known injury to the wrist. The pain has been consistent since it began, although it is 'a lot better than it was Friday'.  He is right-handed and reports that certain movements, such as turning the wrist, exacerbate the pain, while circular motions are less bothersome. He can touch all fingers to his thumb and maintains good strength, although squeezing causes discomfort.  His current medications include Eliquis, which restricts the use of anti-inflammatories, and he has been using Tylenol for pain management. Over the weekend, he has been managing the pain with ice, heat, and Tylenol.  He has a history of diabetes, with recent fluctuations in blood sugar levels.       Review of Systems   Past Medical History:  Diagnosis Date   (HFpEF) heart failure with preserved ejection fraction (HCC)    a. 06/2015 Echo: EF 50-55%, Gr1 DD. Mild MR. Mildly dil LA. Nl RV fxn. Nl PASP.   Arthritis    a. right knee   Asthma    Atrial tachycardia (HCC)    Chronic anticoagulation    Apixaban   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 etiology.   COPD (chronic obstructive pulmonary disease) (HCC)    Coronary artery disease    a. 05/2012 Cath: severe 3VD-->CABG x  3 by Dr. Tyrone Sage in 01/07/2014Krista Blue, SVG->LCX & SVG->RPDA; b. 06/2014 MV: no ischemia/infarct.   DDD (degenerative disc disease), lumbar    Dyspnea    Edema    LEGS/ FEET   Essential hypertension    History of Helicobacter pylori infection    HNP (herniated nucleus pulposus), lumbar    HOH (hard of hearing)    Hypercholesteremia    Hypothyroidism    Low back pain radiating to lower extremity    Lumbar radiculitis    Lumbar stenosis with neurogenic claudication    Neuropathy    PAF (paroxysmal atrial fibrillation) (HCC)    a. CHA2DS2VASc = 5 (age, CHF, HTN, vascular disase, T2DM) --> Eliquis.   Sepsis (HCC) 2021   Shingles    Sleep apnea    NO CPAP   Type II diabetes mellitus (HCC)     Current Outpatient Medications  Medication Sig Dispense Refill   acetaminophen (TYLENOL) 500 MG tablet Take 1,000 mg by mouth every 8 (eight) hours as needed for moderate pain or mild pain.     Albuterol Sulfate 108 (90 Base) MCG/ACT AEPB Inhale 2 puffs into the lungs every 6 (six) hours as needed (shortness of breath).      apixaban (ELIQUIS) 5 MG TABS tablet Take 1 tablet by mouth twice daily 60 tablet 5   ARTIFICIAL TEAR SOLUTION OP  Place 1 drop into both eyes daily.     atorvastatin (LIPITOR) 40 MG tablet Take 1 tablet (40 mg total) by mouth daily. Pt needs to keep upcoming appt in April for additional refills 90 tablet 1   Blood Glucose Monitoring Suppl (ONE TOUCH ULTRA 2) w/Device KIT Use to check blood sugar as advised up to twice a day 1 kit 0   BREZTRI AEROSPHERE 160-9-4.8 MCG/ACT AERO Inhale 2 puffs into the lungs 2 (two) times daily. 32.1 g 3   calcium carbonate (TUMS - DOSED IN MG ELEMENTAL CALCIUM) 500 MG chewable tablet Chew 1,000 mg by mouth daily as needed for indigestion or heartburn.     cetirizine (ZYRTEC) 10 MG tablet Take 10 mg by mouth daily.     cyanocobalamin (VITAMIN B12) 1000 MCG tablet Take 1,000 mcg by mouth daily.     FARXIGA 10 MG TABS tablet Take 1 tablet (10 mg  total) by mouth daily before breakfast. 90 tablet 3   fluticasone (FLONASE) 50 MCG/ACT nasal spray Place 2 sprays into both nostrils daily. 16 g 3   furosemide (LASIX) 20 MG tablet Take 1 tablet (20 mg total) by mouth daily. 90 tablet 3   gabapentin (NEURONTIN) 100 MG capsule TAKE 1 CAPSULE BY MOUTH THREE TIMES DAILY 270 capsule 0   ipratropium-albuterol (DUONEB) 0.5-2.5 (3) MG/3ML SOLN Take 3 mLs by nebulization every 6 (six) hours as needed (shortness of breath).     LANCETS ULTRA THIN MISC 1 Device by Does not apply route 4 (four) times daily -  before meals and at bedtime. 200 each 5   levothyroxine (SYNTHROID) 88 MCG tablet Take 1 tablet (88 mcg total) by mouth daily before breakfast. 90 tablet 3   metFORMIN (GLUCOPHAGE) 1000 MG tablet Take 1 tablet (1,000 mg total) by mouth 2 (two) times daily with a meal. 180 tablet 3   metoprolol tartrate (LOPRESSOR) 50 MG tablet TAKE 1 & 1/2 (ONE & ONE-HALF) TABLETS BY MOUTH TWICE DAILY 270 tablet 0   montelukast (SINGULAIR) 10 MG tablet Take 1 tablet by mouth at bedtime.     ONETOUCH ULTRA test strip USE TO CHECK GLUCOSE TWICE DAILY 200 each 0   spironolactone (ALDACTONE) 25 MG tablet TAKE 1/2 (ONE-HALF) TABLET BY MOUTH ONCE DAILY.  PLEASE CONTACT OFFICE FOR FUTURE APPOINTMENT AND REFILLS 45 tablet 0   No current facility-administered medications for this visit.    Allergies  Allergen Reactions   Bydureon [Exenatide] Anaphylaxis    Possible from bydureon, not 100% confirmed   Clindamycin/Lincomycin Hives and Itching   Penicillins Hives, Itching, Rash and Other (See Comments)    Has patient had a PCN reaction causing immediate rash, facial/tongue/throat swelling, SOB or lightheadedness with hypotension: Yes Has patient had a PCN reaction causing severe rash involving mucus membranes or skin necrosis: No Has patient had a PCN reaction that required hospitalization: No Has patient had a PCN reaction occurring within the last 10 years: No If all of  the above answers are "NO", then may proceed with Cephalosporin use.   Sulfa Antibiotics Shortness Of Breath   Tiotropium Shortness Of Breath and Swelling   Penicillin G Hives    Joint stiffness   Spiriferis Other (See Comments)    Affected breathing   Spiriva Handihaler [Tiotropium Bromide Monohydrate]     Affected breathing   Iodinated Contrast Media Rash and Other (See Comments)    Redness Redness   Other Rash    Stress test dye    Family History  Problem Relation Age of Onset   Heart disease Mother    Clotting disorder Mother    Hypertension Sister     Social History   Socioeconomic History   Marital status: Married    Spouse name: Clide Dales   Number of children: 4   Years of education: Not on file   Highest education level: Some college, no degree  Occupational History   Occupation: retired  Tobacco Use   Smoking status: Former    Current packs/day: 0.00    Average packs/day: 1 pack/day for 8.0 years (8.0 ttl pk-yrs)    Types: Cigarettes    Start date: 12/31/1959    Quit date: 12/31/1967    Years since quitting: 55.5   Smokeless tobacco: Former  Building services engineer status: Never Used  Substance and Sexual Activity   Alcohol use: No    Alcohol/week: 0.0 standard drinks of alcohol    Comment: 05/06/2012 "last alcohol several years ago; never had problem wit"   Drug use: No   Sexual activity: Not Currently  Other Topics Concern   Not on file  Social History Narrative   ** Merged History Encounter **          Working part time - not currently working because of covid-19    Social Drivers of Corporate investment banker Strain: Low Risk  (01/25/2023)   Received from [facility]   Overall Financial Resource Strain (CARDIA)    Difficulty of Paying Living Expenses: Not hard at all  Food Insecurity: No Food Insecurity (01/25/2023)   [facility] from [facility]   Hunger Vital Sign    Worried About Running Out of Food in the Last  Year: Never true    Ran Out of Food in the Last Year: Never true  Transportation Needs: No Transportation Needs (01/25/2023)   Received from [facility] - Transportation    In the past 12 months, has lack of transportation kept you from medical appointments or from getting medications?: No    Lack of Transportation (Non-Medical): No  Physical Activity: Insufficiently Active (08/18/2022)   Exercise Vital Sign    Days of Exercise per Week: 3 days    Minutes of Exercise per Session: 30 min  Stress: No Stress Concern Present (08/18/2022)   [employer] of Occupational Health - Occupational Stress Questionnaire    Feeling of Stress : Not at all  Social Connections: Moderately Integrated (08/18/2022)   Social Connection and Isolation Panel [NHANES]    Frequency of Communication with Friends and Family: More than three times a week    Frequency of Social Gatherings with Friends and Family: Twice a week    Attends Religious Services: More than 4 times per year    Active Member of [employer] or Organizations: No    Attends Banker Meetings: Never    Marital Status: Married  Catering manager Violence: Not At Risk (08/18/2022)   Humiliation, Afraid, Rape, and Kick questionnaire    Fear of Current or Ex-Partner: No    Emotionally Abused: No    Physically Abused: No    Sexually Abused: No     Constitutional: Denies fever, malaise, fatigue, headache or abrupt weight changes.  Respiratory: Denies difficulty breathing, shortness of breath, cough or sputum production.   Cardiovascular: Denies chest pain, chest tightness, palpitations or swelling in the hands or feet.  Musculoskeletal: Patient reports left wrist pain and swelling.  Denies decrease  in range of motion, difficulty with gait, muscle pain.  Skin: Denies redness, rashes, lesions or ulcercations.  Neurological: Denies numbness, tingling, weakness or problems with balance and coordination.    No other  specific complaints in a complete review of systems (except as listed in HPI above).      Objective:   Physical Exam  BP 124/70 (BP Location: Left Arm, Patient Position: Sitting, Cuff Size: Normal)   Ht 5\' 11"  (1.803 m)   Wt 182 lb 6.4 oz (82.7 kg)   BMI 25.44 kg/m  Wt Readings from Last 3 Encounters:  06/18/23 182 lb 6.4 oz (82.7 kg)  05/29/23 188 lb (85.3 kg)  03/09/23 193 lb (87.5 kg)    General: Appears his stated age, overweight in NAD. Skin: Warm, dry and intact. No redness or warmth noted. Cardiovascular: Normal rate and rhythm.  Radial pulse 2+ on the left. Pulmonary/Chest: Normal effort and positive vesicular breath sounds. No respiratory distress. No wheezes, rales or ronchi noted.  Musculoskeletal: Normal flexion, extension rotation of left wrist.  No signs of joint swelling.  Pain with palpation over the distal ulnar head.  No difficulty with gait.  Neurological: Alert and oriented.  Coordination normal.    BMET    Component Value Date/Time   NA 139 05/29/2023 1535   K 4.6 05/29/2023 1535   CL 100 05/29/2023 1535   CO2 24 05/29/2023 1535   GLUCOSE 171 (H) 05/29/2023 1535   GLUCOSE 118 (H) 08/07/2022 0932   BUN 20 05/29/2023 1535   CREATININE 1.21 05/29/2023 1535   CREATININE 0.99 08/07/2022 0932   CALCIUM 9.4 05/29/2023 1535   GFRNONAA >60 03/03/2022 1552   GFRNONAA 74 06/24/2018 0846   GFRAA 67 05/31/2020 0903   GFRAA 85 06/24/2018 0846    Lipid Panel     Component Value Date/Time   CHOL 120 08/07/2022 0932   CHOL 139 12/14/2014 0834   TRIG 75 08/07/2022 0932   HDL 56 08/07/2022 0932   HDL 43 12/14/2014 0834   CHOLHDL 2.1 08/07/2022 0932   VLDL 20 11/21/2019 0915   LDLCALC 48 08/07/2022 0932    CBC    Component Value Date/Time   WBC 10.5 05/29/2023 1535   WBC 6.2 08/07/2022 0932   RBC 5.32 05/29/2023 1535   RBC 4.82 08/07/2022 0932   HGB 15.7 05/29/2023 1535   HCT 46.9 05/29/2023 1535   PLT 343 05/29/2023 1535   MCV 88 05/29/2023 1535    MCH 29.5 05/29/2023 1535   MCH 29.5 08/07/2022 0932   MCHC 33.5 05/29/2023 1535   MCHC 33.0 08/07/2022 0932   RDW 14.3 05/29/2023 1535   LYMPHSABS 1,178 08/07/2022 0932   LYMPHSABS 1.0 05/31/2020 0903   MONOABS 1.6 (H) 06/27/2019 0508   EOSABS 229 08/07/2022 0932   EOSABS 0.0 05/31/2020 0903   BASOSABS 81 08/07/2022 0932   BASOSABS 0.0 05/31/2020 0903    Hgb A1C Lab Results  Component Value Date   HGBA1C 6.6 (A) 02/06/2023            Assessment & Plan:   Assessment and Plan    Left Wrist Pain Acute onset of wrist pain since Friday, with sharp and aching pain, minimal swelling, and no redness or warmth. No history of trauma or gout. Pain radiates to the elbow. No numbness, tingling, or weakness in the fingertips. Physical examination suggests possible arthritis. -Prescribe Prednisone 10mg  for 5 days-monitor sugars. -Discontinue Tylenol while on Prednisone. -If symptoms worsen, consider ordering an X-ray.  Follow-up -If wrist pain improves significantly within 3 days, patient may discontinue Prednisone early. -If wrist pain worsens by end of the week, patient to contact office for possible X-ray order. -If no contact from patient by end of the week, assume symptoms have resolved.

## 2023-06-18 NOTE — Patient Instructions (Signed)
Arthritis Arthritis means joint pain. It can also mean joint disease. A joint is a place where bones come together. There are more than 100 types of arthritis. What are the causes? Wear and tear of a joint. This is the most common cause. Too much of a chemical called uric acid in the blood, which leads to pain in the joint (gout). Pain and swelling (inflammation) in a joint. Infection of a joint. Injuries in the joint. A reaction to medicines (allergy). In some cases, the cause may not be known. What are the signs or symptoms? Pain in a joint when moving. Redness at a joint. Swelling at a joint. Stiffness at a joint. Warmth coming from the joint. A fever. A feeling of being sick. How is this treated? This condition may be treated with: Treating the cause, if it is known. Rest. Raising (elevating) the joint. Putting cold or hot packs on the joint. Medicines to treat symptoms and reduce pain and swelling. Shots (injections) of medicines, such as cortisone, into the joint. You may also be told to make changes in your life, such as doing exercises and losing weight. Follow these instructions at home: Medicines Take over-the-counter and prescription medicines only as told by your doctor. Do not take aspirin for pain if your doctor says that you may have gout. Activity Rest your joint if your doctor tells you to. Avoid activities that make the pain worse. Exercise your joint regularly as told by your doctor. Try doing exercises like: Swimming. Water aerobics. Biking. Walking. Managing pain, stiffness, and swelling     If told, put ice on the affected area. To do this: Put ice in a plastic bag. Place a towel between your skin and the bag. Leave the ice on for 20 minutes, 2-3 times a day. Take off the ice if your skin turns bright red. This is very important. If you cannot feel pain, heat, or cold, you have a greater risk of damage to the area. If your joint is swollen, raise  (elevate) it above the level of your heart if told by your doctor. If your joint feels stiff in the morning, try taking a warm shower. If told, put heat on the affected area. Do this as often as told by your doctor. Use the heat source that your doctor recommends, such as a moist heat pack or a heating pad. If you have diabetes, do not apply heat without asking your doctor. To apply heat: Place a towel between your skin and the heat source. Leave the heat on for 20-30 minutes. Take off the heat if your skin turns bright red. This is very important. If you cannot feel pain, heat, or cold, you have a greater risk of getting burned. General instructions Maintain a healthy weight. Follow instructions from your doctor for weight control. Do not smoke or use any products that contain nicotine or tobacco. If you need help quitting, ask your doctor. Keep all follow-up visits. Where to find more information [employer]: www.niams.http://www.myers.net/ Contact a doctor if: The pain gets worse. You have a fever. Get help right away if: You have very bad pain in your joint. You have swelling in your joint. Your joint is red. Many joints become painful and swollen. You have very bad back pain. Your leg is very weak. Summary Arthritis means joint pain. It can also mean joint disease. A joint is a place where bones come together. The most common cause of this condition is wear and  tear of a joint. Symptoms of this condition include redness, swelling, or stiffness of the joint. This condition is treated with rest, raising the joint, medicines, and putting cold or hot packs on the joint. Follow your doctor's instructions about medicines, activity, exercises, and other home care treatments. This information is not intended to replace advice given to you by your health care provider. Make sure you discuss any questions you have with your health care provider. Document Revised: 01/25/2021 Document  Reviewed: 01/25/2021 Elsevier Patient Education  2024 [employer].

## 2023-07-05 DIAGNOSIS — L814 Other melanin hyperpigmentation: Secondary | ICD-10-CM | POA: Diagnosis not present

## 2023-07-05 DIAGNOSIS — L988 Other specified disorders of the skin and subcutaneous tissue: Secondary | ICD-10-CM | POA: Diagnosis not present

## 2023-07-05 DIAGNOSIS — L578 Other skin changes due to chronic exposure to nonionizing radiation: Secondary | ICD-10-CM | POA: Diagnosis not present

## 2023-07-05 DIAGNOSIS — D0439 Carcinoma in situ of skin of other parts of face: Secondary | ICD-10-CM | POA: Diagnosis not present

## 2023-07-09 ENCOUNTER — Other Ambulatory Visit: Payer: Medicare Other | Admitting: Pharmacist

## 2023-07-09 DIAGNOSIS — E119 Type 2 diabetes mellitus without complications: Secondary | ICD-10-CM

## 2023-07-09 DIAGNOSIS — I5020 Unspecified systolic (congestive) heart failure: Secondary | ICD-10-CM

## 2023-07-09 DIAGNOSIS — I129 Hypertensive chronic kidney disease with stage 1 through stage 4 chronic kidney disease, or unspecified chronic kidney disease: Secondary | ICD-10-CM

## 2023-07-09 DIAGNOSIS — E1142 Type 2 diabetes mellitus with diabetic polyneuropathy: Secondary | ICD-10-CM

## 2023-07-09 DIAGNOSIS — I5022 Chronic systolic (congestive) heart failure: Secondary | ICD-10-CM

## 2023-07-09 NOTE — Progress Notes (Signed)
 07/09/2023 Name: Philip RITSON Sr. MRN: [id number] DOB: 23-Nov-1945  Chief Complaint  Patient presents with   Medication Management   Medication Assistance    Philip KESLING Sr. is a 78 y.o. year old male who presented for a telephone visit.   They were referred to the pharmacist by their PCP for assistance in managing diabetes, hypertension, hyperlipidemia, and medication access.    Subjective:   Care Team: Primary Care Provider: Smitty Cords, DO; Next Scheduled Visit: 08/14/2023 Cardiologist: [name], MD; Next Scheduled Visit: 08/06/2023 Pulmonologist: Mertie Moores, MD; Next Scheduled Visit: 07/25/2023 Orthopedics: Francisco Capuchin, MD  Medication Access/Adherence  Current Pharmacy:  [facility] [place] [address] [place] - [address] [place]) [place] [address] Phone: [phone] Fax: [phone]   Patient reports affordability concerns with their medications: No  Patient reports access/transportation concerns to their pharmacy: No  Patient reports adherence concerns with their medications:  No     Patient uses weekly pillbox     Type 2 Diabetes:   Current medications: metformin 1,000 mg twice daily Farxiga 10 mg - 1 tablet QAM     Current glucose readings:  Fasting Blood Glucose After Supper/Bedtime  4 - March 144 221  5 - March 155 174  6 - March 133 198  7 - March 118 -  8 - March - -  9 - March 155 210  10 - March -     *Attributes elevated readings to portion sizes of carbohydrates, such as rice or pasta, and sweet tea with supper   Reports using blood sugar readings as feedback on dietary choices   Denies symptoms of hypoglycemia   Current medication access support: enrolled in patient assistance for Farxiga from [employer] through 04/30/2024      CHF/Hypertension/ Atrial Fibrillation:     Current medications:  Metoprolol 75 mg twice daily Spironolactone 25 mg - 1/2  tablet (12.5 mg) daily  Farxiga 10 mg - 1 tablets daily Furosemide 20 mg daily   Anticoagulation: Eliquis 5 mg twice daily     Patient has an automated, upper arm home BP cuff Current blood pressure readings readings:  - Last checked yesterday: AM: 102/71, HR 78; PM: 103/73, HR 67    Denies symptoms of hypotension such as lightheadedness or dizziness   Confirms monitoring home weight daily as directed by Cardiology; reports home weight stable ~176 lbs    Current medication access support: enrolled in patient assistance for Farxiga from [employer] through 04/30/2024        COPD/seasonal allergies: Patient follows with Dr. Meredeth Ide at [facility]   Current medications: Markus Daft 2 puffs twice daily Montelukast 10 mg QHS Albuterol inhaler as needed Cetirizine 10 mg daily Flonase nasal spray as needed     Confirms using Breztri inhaler consistently as directed and rinsing mouth out after each use   Current medication access support: Enrolled in [place] patient assistance program through [employer] through 04/30/2024   Objective:  Lab Results  Component Value Date   HGBA1C 6.6 (A) 02/06/2023    Lab Results  Component Value Date   CREATININE 1.21 05/29/2023   BUN 20 05/29/2023   NA 139 05/29/2023   K 4.6 05/29/2023   CL 100 05/29/2023   CO2 24 05/29/2023    Lab Results  Component Value Date   CHOL 120 08/07/2022   HDL 56 08/07/2022   LDLCALC 48 08/07/2022   TRIG 75 08/07/2022  CHOLHDL 2.1 08/07/2022   BP Readings from Last 3 Encounters:  06/18/23 124/70  05/29/23 126/72  03/09/23 110/70   Pulse Readings from Last 3 Encounters:  05/29/23 88  03/09/23 85  02/06/23 61     Medications Reviewed Today     Reviewed by Manuela Neptune, RPH-CPP (Pharmacist) on 07/09/23 at 0920  Med List Status: <None>   Medication Order Taking? Sig Documenting Provider Last Dose Status Informant  acetaminophen (TYLENOL) 500 MG tablet [id number]  Take 1,000 mg by  mouth every 8 (eight) hours as needed for moderate pain or mild pain. [provider]  Active Self  Albuterol Sulfate 108 (90 Base) MCG/ACT AEPB [id number]  Inhale 2 puffs into the lungs every 6 (six) hours as needed (shortness of breath).  [provider]  Active Self  apixaban (ELIQUIS) 5 MG TABS tablet [id number]  Take 1 tablet by mouth twice daily [name], MD  Active   ARTIFICIAL TEAR SOLUTION OP [id number]  Place 1 drop into both eyes daily. [provider]  Active Self  atorvastatin (LIPITOR) 40 MG tablet [id number]  Take 1 tablet (40 mg total) by mouth daily. Pt needs to keep upcoming appt in April for additional refills [name], MD  Active   Blood Glucose Monitoring Suppl (ONE TOUCH ULTRA 2) w/Device KIT [id number]  Use to check blood sugar as advised up to twice a day Smitty Cords, DO  Active Self  BREZTRI AEROSPHERE 160-9-4.8 MCG/ACT Sandrea Matte [id number] Yes Inhale 2 puffs into the lungs 2 (two) times daily. Smitty Cords, DO Taking Active   calcium carbonate (TUMS - DOSED IN MG ELEMENTAL CALCIUM) 500 MG chewable tablet [id number]  Chew 1,000 mg by mouth daily as needed for indigestion or heartburn. [provider]  Active Self  cetirizine (ZYRTEC) 10 MG tablet [id number] Yes Take 10 mg by mouth daily. [provider] Taking Active Self  cyanocobalamin (VITAMIN B12) 1000 MCG tablet [id number]  Take 1,000 mcg by mouth daily. [provider]  Active   FARXIGA 10 MG TABS tablet [id number] Yes Take 1 tablet (10 mg total) by mouth daily before breakfast. Smitty Cords, DO Taking Active   fluticasone (FLONASE) 50 MCG/ACT nasal spray [id number]  Place 2 sprays into both nostrils daily. [name], Netta Neat, DO  Active   furosemide (LASIX) 20 MG tablet [id number] Yes Take 1 tablet (20 mg total) by mouth daily. Fransico Michael, [name], [name] Taking Active   gabapentin (NEURONTIN) 100 MG capsule [id number]  TAKE 1  CAPSULE BY MOUTH THREE TIMES DAILY [name], Netta Neat, DO  Active   ipratropium-albuterol (DUONEB) 0.5-2.5 (3) MG/3ML SOLN [id number]  Take 3 mLs by nebulization every 6 (six) hours as needed (shortness of breath). [provider]  Active Self           Med Note Joella Prince A   Fri Dec 24, 2020  4:04 PM)    Tollie Eth THIN MISC [id number]  1 Device by Does not apply route 4 (four) times daily -  before meals and at bedtime. Delight Ovens, MD  Active Self  levothyroxine (SYNTHROID) 88 MCG tablet [id number]  Take 1 tablet (88 mcg total) by mouth daily before breakfast. Smitty Cords, DO  Active   metFORMIN (GLUCOPHAGE) 1000 MG tablet [id number] Yes Take 1 tablet (1,000 mg total) by mouth 2 (two) times daily with a meal. Smitty Cords, DO Taking Active   metoprolol tartrate (LOPRESSOR) 50 MG tablet [id number] Yes TAKE  1 & 1/2 (ONE & ONE-HALF) TABLETS BY MOUTH TWICE DAILY Arida, Chelsea Aus, MD Taking Active   montelukast (SINGULAIR) 10 MG tablet [id number]  Take 1 tablet by mouth at bedtime. [provider]  Active   Koren Bound test strip [id number]  USE TO CHECK GLUCOSE TWICE DAILY Smitty Cords, DO  Active   spironolactone (ALDACTONE) 25 MG tablet [id number] Yes TAKE 1/2 (ONE-HALF) TABLET BY MOUTH ONCE DAILY.  PLEASE CONTACT OFFICE FOR FUTURE APPOINTMENT AND REFILLS [name], MD Taking Active               Assessment/Plan:   COPD/Seasonal Allergies: - Reviewed appropriate inhaler technique - Patient to follow up with [employer] assistance program as needed regarding refills of Breztri inhaler   Diabetes: - Counseled on importance of continuing to control carbohydrate portion sizes and using blood sugar checks to reinforce dietary choices - Encourage patient to limit sweets and sugary beverages (sweet tea, Pepsi) - Recommend to continue to check glucose, keep log of results, to bring this record to medical appointments and  use readings as feedback on dietary choices - Patient to follow up with [employer] assistance program as needed regarding refills of Farxiga   CHF/Hypertension/Atrial Fibrillation: - Encourage patient to continue to weigh daily - Recommended to continue to check home blood pressure and heart rate, keep log of results and contact Cardiologist for BP and HR if needed for readings outside of established parameters or symptoms      Follow Up Plan:    Clinical Pharmacist will follow up with patient by telephone on 10/01/2023 at 9:00 AM      [name], PharmD, Patsy Baltimore, CPP Clinical Pharmacist [facility] [phone]

## 2023-07-09 NOTE — Patient Instructions (Addendum)
Goals Addressed             This Visit's Progress    Pharmacy Goals       Our goal A1c is less than 7%. This corresponds with fasting sugars less than 130 and 2 hour after meal sugars less than 180. Please check your blood sugar and keep log of results  Please continue to monitor your home weight, blood pressure and heart rate  Our goal bad cholesterol, or LDL, is less than 70 . This is why it is important to continue taking your atorvastatin  Feel free to call me with any questions or concerns. I look forward to our next call!    [name], PharmD, [facility] Clinical Pharmacist [facility] [phone]

## 2023-07-12 DIAGNOSIS — H60392 Other infective otitis externa, left ear: Secondary | ICD-10-CM | POA: Diagnosis not present

## 2023-07-12 DIAGNOSIS — H6692 Otitis media, unspecified, left ear: Secondary | ICD-10-CM | POA: Diagnosis not present

## 2023-07-18 ENCOUNTER — Other Ambulatory Visit: Payer: Self-pay | Admitting: Family Medicine

## 2023-07-18 DIAGNOSIS — E1142 Type 2 diabetes mellitus with diabetic polyneuropathy: Secondary | ICD-10-CM

## 2023-07-19 NOTE — Telephone Encounter (Signed)
 Requested Prescriptions  Pending Prescriptions Disp Refills   gabapentin (NEURONTIN) 100 MG capsule [Pharmacy Med Name: Gabapentin 100 MG Oral Capsule] 270 capsule 0    Sig: TAKE 1 CAPSULE BY MOUTH THREE TIMES DAILY     Neurology: Anticonvulsants - gabapentin Passed - 07/19/2023  1:51 PM      Passed - Cr in normal range and within 360 days    Creat  Date Value Ref Range Status  08/07/2022 0.99 0.70 - 1.28 mg/dL Final   Creatinine, Ser  Date Value Ref Range Status  05/29/2023 1.21 0.76 - 1.27 mg/dL Final   Creatinine, Urine  Date Value Ref Range Status  08/07/2022 84 20 - 320 mg/dL Final         Passed - Completed PHQ-2 or PHQ-9 in the last 360 days      Passed - Valid encounter within last 12 months    Recent Outpatient Visits           4 months ago Acute bronchitis with COPD [facility])   [facility] [place], Netta Neat, DO   4 months ago Type 2 diabetes mellitus with peripheral neuropathy [facility])   [place] [facility] Delles, Gentry Fitz A, RPH-CPP   5 months ago Type 2 diabetes mellitus with peripheral neuropathy [facility])   [place] [facility] [place], Netta Neat, DO   5 months ago Type 2 diabetes mellitus with peripheral neuropathy [facility])   [place] Delles, Gentry Fitz A, RPH-CPP   7 months ago Type 2 diabetes mellitus with peripheral neuropathy [facility])   [place] [facility] Delles, Jackelyn Poling, RPH-CPP       Future Appointments             In 2 weeks Fransico Michael, [name], [name] [facility] [place]   In 3 weeks Althea Charon, Netta Neat, DO [facility], [facility]

## 2023-07-23 ENCOUNTER — Encounter: Payer: Self-pay | Admitting: Family Medicine

## 2023-07-23 ENCOUNTER — Ambulatory Visit (INDEPENDENT_AMBULATORY_CARE_PROVIDER_SITE_OTHER): Admitting: Family Medicine

## 2023-07-23 VITALS — BP 122/84 | HR 85 | Ht 71.0 in | Wt 178.0 lb

## 2023-07-23 DIAGNOSIS — J0110 Acute frontal sinusitis, unspecified: Secondary | ICD-10-CM

## 2023-07-23 DIAGNOSIS — H9192 Unspecified hearing loss, left ear: Secondary | ICD-10-CM | POA: Diagnosis not present

## 2023-07-23 DIAGNOSIS — H66013 Acute suppurative otitis media with spontaneous rupture of ear drum, bilateral: Secondary | ICD-10-CM

## 2023-07-23 NOTE — Progress Notes (Signed)
 Subjective:    Patient ID: Philip [name]., male    DOB: Sep 08, 1945, 78 y.o.   MRN: [id number]  Philip FARISS Sr. is a 78 y.o. male presenting on 07/23/2023 for Hearing Loss (Ear infection)   HPI  Discussed the use of AI scribe software for clinical note transcription with the patient, who gave verbal consent to proceed.  History of Present Illness   Philip [name]. is a 78 year old male who presents with bilateral ear pain and hearing loss.  The ear pain began suddenly one evening and was severe enough to disrupt sleep. He describes a sensation of 'bed springs' noise in his ears and significant hearing loss, particularly in the left ear. Hearing has slightly improved in the right ear since completing antibiotics, but overall hearing remains impaired.  He initially sought care at an urgent care facility on July 12, 2023, where he was prescribed a ten-day course of antibiotics, possibly erythromycin, for an ear infection. He completed the antibiotic regimen, which has led to some improvement in his symptoms. He feels that the medication has helped alleviate some of his symptoms, but he still experiences discomfort and hearing issues.  Prior to starting the antibiotics, he experienced significant nasal congestion and a productive cough with foul-colored sputum, which has since improved. No fever or current illness symptoms are present.  The ear issues have begun to affect his balance, which is concerning to him.         07/23/2023   11:29 AM 03/09/2023    2:09 PM 02/06/2023    9:32 AM  Depression screen PHQ 2/9  Decreased Interest 1 0 1  Down, Depressed, Hopeless 0 0 0  PHQ - 2 Score 1 0 1  Altered sleeping 1 0 0  Tired, decreased energy 1 3 1   Change in appetite 1 1 0  Feeling bad or failure about yourself  0 0 0  Trouble concentrating 0 1 0  Moving slowly or fidgety/restless 0 0 0  Suicidal thoughts 0 0 0  PHQ-9 Score 4 5 2   Difficult doing work/chores Not difficult at  all Somewhat difficult Not difficult at all       07/23/2023   11:29 AM 03/09/2023    2:10 PM 02/06/2023    9:32 AM 08/07/2022    8:54 AM  GAD 7 : Generalized Anxiety Score  Nervous, Anxious, on Edge 0 0 1 0  Control/stop worrying 0 0 0 0  Worry too much - different things 0 0 0 0  Trouble relaxing 0 0 0 0  Restless 0 0 0 0  Easily annoyed or irritable 0 0 0 0  Afraid - awful might happen 0 0 0 0  Total GAD 7 Score 0 0 1 0  Anxiety Difficulty    Not difficult at all    Social History   Tobacco Use   Smoking status: Former    Current packs/day: 0.00    Average packs/day: 1 pack/day for 8.0 years (8.0 ttl pk-yrs)    Types: Cigarettes    Start date: 12/31/1959    Quit date: 12/31/1967    Years since quitting: 55.5   Smokeless tobacco: Former  Building services engineer status: Never Used  Substance Use Topics   Alcohol use: No    Alcohol/week: 0.0 standard drinks of alcohol    Comment: 05/06/2012 "last alcohol several years ago; never had problem wit"   Drug use: No    Review of  Systems  HENT:  Positive for ear pain and hearing loss.    Per HPI unless specifically indicated above     Objective:    BP 122/84 (BP Location: Right Arm, Patient Position: Sitting)   Pulse 85   Ht 5\' 11"  (1.803 m)   Wt 178 lb (80.7 kg)   SpO2 95%   BMI 24.83 kg/m   Wt Readings from Last 3 Encounters:  07/23/23 178 lb (80.7 kg)  06/18/23 182 lb 6.4 oz (82.7 kg)  05/29/23 188 lb (85.3 kg)    Physical Exam Vitals and nursing note reviewed.  Constitutional:      General: He is not in acute distress.    Appearance: Normal appearance. He is well-developed. He is not diaphoretic.     Comments: Well-appearing, comfortable, cooperative  HENT:     Head: Normocephalic and atraumatic.     Right Ear: There is no impacted cerumen.     Left Ear: There is no impacted cerumen.     Ears:     Comments: Left TM with abnormal anatomy, distorted appearance. No clear view of TM. Suspect spontaneous rupture of TM  has taken place. No purulence or drainage.  Right TM is intact but has hazy fluid with effusion. Eyes:     General:        Right eye: No discharge.        Left eye: No discharge.     Conjunctiva/sclera: Conjunctivae normal.  Cardiovascular:     Rate and Rhythm: Normal rate.  Pulmonary:     Effort: Pulmonary effort is normal.  Skin:    General: Skin is warm and dry.     Findings: No erythema or rash.  Neurological:     Mental Status: He is alert and oriented to person, place, and time.  Psychiatric:        Mood and Affect: Mood normal.        Behavior: Behavior normal.        Thought Content: Thought content normal.     Comments: Well groomed, good eye contact, normal speech and thoughts     Results for orders placed or performed in visit on 06/14/23  ECHOCARDIOGRAM COMPLETE   Collection Time: 06/14/23  9:52 AM  Result Value Ref Range   AR max vel 2.71 cm2   AV Peak grad 4.5 mmHg   Ao pk vel 1.06 m/s   S' Lateral 3.10 cm   AV Area VTI 2.17 cm2   AV Mean grad 2.0 mmHg   Single Plane A4C EF 44.2 %   Single Plane A2C EF 40.8 %   Calc EF 43.2 %   AV Area mean vel 2.60 cm2   Est EF 40 - 45%       Assessment & Plan:   Problem List Items Addressed This Visit   None Visit Diagnoses       Acute non-recurrent frontal sinusitis    -  Primary     Non-recurrent acute suppurative otitis media of both ears with spontaneous rupture of tympanic membranes       Relevant Orders   Ambulatory referral to ENT     Hearing loss of left ear, unspecified hearing loss type       Relevant Orders   Ambulatory referral to ENT        Bilateral Otitis Media with Possible Eardrum Rupture 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 Improvement noted, but left ear requires ENT evaluation.  Already improving from infection stand point with antibiotic course from Urgent Care recently Afebrile. Defer  repeat antibiotics today  - Refer to ENT specialist for left ear evaluation. - Monitor for symptom worsening; consider antibiotics if needed. - Advise contacting ENT for appointment scheduling.     Orders Placed This Encounter  Procedures   Ambulatory referral to ENT    Referral Priority:   Routine    Referral Type:   Consultation    Referral Reason:   Specialty Services Required    Requested Specialty:   Otolaryngology    Number of Visits Requested:   1    No orders of the defined types were placed in this encounter.   Follow up plan: Return if symptoms worsen or fail to improve.   Philip Pilar, DO [facility] 07/23/2023, 11:39 AM

## 2023-07-23 NOTE — Patient Instructions (Addendum)
 Thank you for coming to the office today.  If worsening and not improving call back and we can order other antibiotic and treatment  Referral to ENT specialist now  Please schedule a Follow-up Appointment to: Return if symptoms worsen or fail to improve.  If you have any other questions or concerns, please feel free to call the office or send a message through MyChart. You may also schedule an earlier appointment if necessary.  Additionally, you may be receiving a survey about your experience at our office within a few days to 1 week by e-mail or mail. We value your feedback.  Saralyn Pilar, DO [facility], [place]

## 2023-07-25 DIAGNOSIS — J449 Chronic obstructive pulmonary disease, unspecified: Secondary | ICD-10-CM | POA: Diagnosis not present

## 2023-07-25 DIAGNOSIS — G4733 Obstructive sleep apnea (adult) (pediatric): Secondary | ICD-10-CM | POA: Diagnosis not present

## 2023-07-31 ENCOUNTER — Other Ambulatory Visit: Payer: Self-pay | Admitting: Family Medicine

## 2023-07-31 ENCOUNTER — Other Ambulatory Visit: Payer: Self-pay | Admitting: Cardiovascular Disease

## 2023-07-31 DIAGNOSIS — I4821 Permanent atrial fibrillation: Secondary | ICD-10-CM

## 2023-07-31 DIAGNOSIS — J0110 Acute frontal sinusitis, unspecified: Secondary | ICD-10-CM

## 2023-08-01 NOTE — Telephone Encounter (Signed)
 Prescription refill request for Eliquis received. Indication: Afib  Last office visit: 05/29/23 Fransico Michael)  Scr: 1.21 (05/29/23)  Age: 78 Weight: 80.7kg  Appropriate dose. Refill sent.

## 2023-08-01 NOTE — Telephone Encounter (Signed)
 Refill request

## 2023-08-02 NOTE — Telephone Encounter (Signed)
 Requested Prescriptions  Pending Prescriptions Disp Refills   fluticasone (FLONASE) 50 MCG/ACT nasal spray [Pharmacy Med Name: Fluticasone Propionate 50 MCG/ACT Nasal Suspension] 16 g 0    Sig: Use 2 spray(s) in each nostril once daily     Ear, Nose, and Throat: Nasal Preparations - Corticosteroids Passed - 08/02/2023 12:27 PM      Passed - Valid encounter within last 12 months    Recent Outpatient Visits           1 week ago Acute non-recurrent frontal sinusitis   [facility] [facility] Smitty Cords, DO   1 month ago Left wrist pain   [place] [facility] [place], Salvadore [place], NP       Future Appointments             In 4 days Fransico Michael, [employer], [name] [facility] [place]   In 1 week Althea Charon, Netta Neat, DO [place] [facility], [employer]

## 2023-08-06 ENCOUNTER — Ambulatory Visit: Payer: Medicare Other | Attending: Medical | Admitting: Medical

## 2023-08-06 ENCOUNTER — Encounter: Payer: Self-pay | Admitting: Medical

## 2023-08-06 ENCOUNTER — Other Ambulatory Visit: Payer: Self-pay | Admitting: Family Medicine

## 2023-08-06 VITALS — BP 100/70 | HR 89 | Resp 22 | Ht 70.0 in | Wt 186.2 lb

## 2023-08-06 DIAGNOSIS — Z0000 Encounter for general adult medical examination without abnormal findings: Secondary | ICD-10-CM

## 2023-08-06 DIAGNOSIS — J432 Centrilobular emphysema: Secondary | ICD-10-CM

## 2023-08-06 DIAGNOSIS — I5020 Unspecified systolic (congestive) heart failure: Secondary | ICD-10-CM | POA: Diagnosis not present

## 2023-08-06 DIAGNOSIS — E1142 Type 2 diabetes mellitus with diabetic polyneuropathy: Secondary | ICD-10-CM

## 2023-08-06 DIAGNOSIS — I129 Hypertensive chronic kidney disease with stage 1 through stage 4 chronic kidney disease, or unspecified chronic kidney disease: Secondary | ICD-10-CM

## 2023-08-06 DIAGNOSIS — I4821 Permanent atrial fibrillation: Secondary | ICD-10-CM | POA: Diagnosis not present

## 2023-08-06 DIAGNOSIS — I2510 Atherosclerotic heart disease of native coronary artery without angina pectoris: Secondary | ICD-10-CM | POA: Diagnosis not present

## 2023-08-06 DIAGNOSIS — R351 Nocturia: Secondary | ICD-10-CM

## 2023-08-06 DIAGNOSIS — E1169 Type 2 diabetes mellitus with other specified complication: Secondary | ICD-10-CM

## 2023-08-06 DIAGNOSIS — E034 Atrophy of thyroid (acquired): Secondary | ICD-10-CM

## 2023-08-06 DIAGNOSIS — I5022 Chronic systolic (congestive) heart failure: Secondary | ICD-10-CM

## 2023-08-06 NOTE — Patient Instructions (Signed)
 Medication Instructions:  No changes at this time.   *If you need a refill on your cardiac medications before your next appointment, please call your pharmacy*  Lab Work: None  If you have labs (blood work) drawn today and your tests are completely normal, you will receive your results only by: MyChart Message (if you have MyChart) OR A paper copy in the mail If you have any lab test that is abnormal or we need to change your treatment, we will call you to review the results.  Testing/Procedures: None  Follow-Up: At [facility], you and your health needs are our priority.  As part of our continuing mission to provide you with exceptional heart care, our providers are all part of one team.  This team includes your primary Cardiologist (physician) and Advanced Practice Providers or APPs (Physician Assistants and Nurse Practitioners) who all work together to provide you with the care you need, when you need it.  Your next appointment:   6 month(s)  Provider:   [name], MD or Cadence Fransico Michael, [place]

## 2023-08-06 NOTE — Progress Notes (Unsigned)
 Cardiology Office Note:  .   Date:  08/08/2023  ID:  Philip Snow Sr., DOB [birth year], MRN [id number] PCP: Smitty Cords, DO  [facility] Providers Cardiologist:  [name], MD {   History of Present Illness: .   Philip Snow Sr. is a 78 y.o. male with a hx of CAD s/p CABG, chronic systolic heart failure, chronic afib, atrial tachycardia who presents for follow-up.   The patient underwent CABG in January 2014. He had post-op afib which was treated with amiodarone.    He was hospitalized in February 2021 with sepsis secondary to PNA and MSSA bacteremia complicated by atrial flutter with RVR. Echo showed LVEF 40-45% with global HK, mildly reduced RV function, moderate pulmonary HTN, mild to moderate MR. TEE showed an EF of 30-35% with no evidence of vegetations.    He had a Lexiscan Myoview in March of 2021 which showed no evidence of ischemia. Echo in October 2022 showed an EF 40-45% with normal LV function.   The patient was last seen 05/29/23 reporting SOB. He had started lasix a week prior. Labs, CXR and echo were ordered. Echo showed LVEF 40-45%, global HK, mildly reduced RVSF, mild to mod MR.  Today, the patient reports he has been doing well. He feels breathing is worse from the pollen. He also had an ear infection. He has reduced hearing on the left side and sees ENT later this month. He denies chest pain or lower leg edema.    Studies Reviewed: [name]   EKG Interpretation Date/Time:  Monday August 06 2023 15:31:40 EDT Ventricular Rate:  84 PR Interval:    QRS Duration:  84 QT Interval:  374 QTC Calculation: 441 R Axis:   7  Text Interpretation: Atrial fibrillation Low voltage QRS Cannot rule out Anterior infarct , age undetermined When compared with ECG of 29-May-2023 15:08, No significant change was found Confirmed by Fransico Michael, Aquan Kope ([id number]) on 08/06/2023 3:42:53 PM    Echo 06/2023 1. Left ventricular ejection fraction, by estimation, is 40 to 45%. Left   ventricular ejection fraction by 3D volume is 40 %. Left ventricular  ejection fraction by 2D MOD biplane is 43.2 %. The left ventricle has  mildly decreased function. The left  ventricle demonstrates global hypokinesis. Left ventricular diastolic  parameters are indeterminate.   2. Right ventricular systolic function is mildly reduced. The right  ventricular size is normal. There is normal pulmonary artery systolic  pressure. The estimated right ventricular systolic pressure is 31.4 mmHg.   3. Right atrial size was mildly dilated.   4. The mitral valve is normal in structure. Mild to moderate mitral valve  regurgitation. No evidence of mitral stenosis.   5. The aortic valve is tricuspid. Aortic valve regurgitation is not  visualized. Aortic valve sclerosis is present, with no evidence of aortic  valve stenosis.   6. The inferior vena cava is normal in size with greater than 50%  respiratory variability, suggesting right atrial pressure of 3 mmHg.   Echo limited 01/2021  1. Left ventricular ejection fraction, by estimation, is 40 to 45%. The  left ventricle has mildly decreased function. The left ventricle  demonstrates global hypokinesis. Left ventricular diastolic parameters are  indeterminate.   2. Right ventricular systolic function is normal. The right ventricular  size is normal.   3. The mitral valve is normal in structure. Trivial mitral valve  regurgitation. No evidence of mitral stenosis.   4. The aortic valve is normal in  structure. Aortic valve regurgitation is  not visualized. No aortic stenosis is present.   5. The inferior vena cava is normal in size with greater than 50%  respiratory variability, suggesting right atrial pressure of 3 mmHg.    Echo 08/2019   1. Left ventricular ejection fraction, by estimation, is 40 to 45%. The  left ventricle has mildly decreased function. The left ventricle  demonstrates global hypokinesis. Left ventricular diastolic parameters  are  indeterminate.   2. Right ventricular systolic function is normal. The right ventricular  size is normal. Tricuspid regurgitation signal is inadequate for assessing  PA pressure.   3. Left atrial size was moderately dilated.   4. The mitral valve is normal in structure. Moderate mitral valve  regurgitation. No evidence of mitral stenosis.   5. Tricuspid valve regurgitation is mild to moderate.    Myoview Lexiscan 06/2019 Narrative & Impression  The study is normal. This is a low risk study. The left ventricular ejection fraction is normal (63%). There is no evidence for ischemia        Physical Exam:   VS:  BP 100/70 (BP Location: Left Arm, Patient Position: Sitting, Cuff Size: Normal)   Pulse 89   Resp (!) 22   Ht 5\' 10"  (1.778 m)   Wt 186 lb 4 oz (84.5 kg)   SpO2 94%   BMI 26.72 kg/m    Wt Readings from Last 3 Encounters:  08/06/23 186 lb 4 oz (84.5 kg)  07/23/23 178 lb (80.7 kg)  06/18/23 182 lb 6.4 oz (82.7 kg)    GEN: Well nourished, well developed in no acute distress NECK: No JVD; No carotid bruits CARDIAC: Irreg Irreg, no murmurs, rubs, gallops RESPIRATORY:  Clear to auscultation without rales, wheezing or rhonchi  ABDOMEN: Soft, non-tender, non-distended EXTREMITIES:  No edema; No deformity   ASSESSMENT AND PLAN: .    Permanent Afib He is in rate controlled Afib on EKG. Continue Eliquis 5mg  BID for stroke ppx. Continue BB for rate control.  CAD The patient denies chest pain. He feels breathing issues are coming from the pollen. He had a normal Myoview in 2021. No ASA given Eliquis. Continue BB and statin therapy.   Chronic systolic heart failure LVEF 40-45% Repeat echo showed unchanged pump function, LVEF 40-45%, mildly reduced RVSF, mild to mod MR. Patient is euvolemic on exam today. Continue lasix 20mg  daily, Lopressor 75mg  BID, Farxiga 10mg  daily and spironolactone 25mg  daily.        Dispo: Follow-up in 6 months  Signed, Thelmer Legler David Stall, [name]

## 2023-08-07 ENCOUNTER — Other Ambulatory Visit: Payer: Self-pay

## 2023-08-07 DIAGNOSIS — E785 Hyperlipidemia, unspecified: Secondary | ICD-10-CM | POA: Diagnosis not present

## 2023-08-07 DIAGNOSIS — I129 Hypertensive chronic kidney disease with stage 1 through stage 4 chronic kidney disease, or unspecified chronic kidney disease: Secondary | ICD-10-CM | POA: Diagnosis not present

## 2023-08-07 DIAGNOSIS — E1169 Type 2 diabetes mellitus with other specified complication: Secondary | ICD-10-CM | POA: Diagnosis not present

## 2023-08-07 DIAGNOSIS — E1142 Type 2 diabetes mellitus with diabetic polyneuropathy: Secondary | ICD-10-CM | POA: Diagnosis not present

## 2023-08-07 DIAGNOSIS — I5022 Chronic systolic (congestive) heart failure: Secondary | ICD-10-CM | POA: Diagnosis not present

## 2023-08-08 LAB — COMPREHENSIVE METABOLIC PANEL WITH GFR
AG Ratio: 1.9 (calc) (ref 1.0–2.5)
ALT: 13 U/L (ref 9–46)
AST: 13 U/L (ref 10–35)
Albumin: 3.7 g/dL (ref 3.6–5.1)
Alkaline phosphatase (APISO): 97 U/L (ref 35–144)
BUN: 21 mg/dL (ref 7–25)
CO2: 29 mmol/L (ref 20–32)
Calcium: 9.4 mg/dL (ref 8.6–10.3)
Chloride: 101 mmol/L (ref 98–110)
Creat: 1.16 mg/dL (ref 0.70–1.28)
Globulin: 2 g/dL (ref 1.9–3.7)
Glucose, Bld: 168 mg/dL — ABNORMAL HIGH (ref 65–99)
Potassium: 4.9 mmol/L (ref 3.5–5.3)
Sodium: 139 mmol/L (ref 135–146)
Total Bilirubin: 0.7 mg/dL (ref 0.2–1.2)
Total Protein: 5.7 g/dL — ABNORMAL LOW (ref 6.1–8.1)
eGFR: 65 mL/min/{1.73_m2} (ref >=60)

## 2023-08-08 LAB — TSH: TSH: 4.33 m[IU]/L (ref 0.40–4.50)

## 2023-08-08 LAB — HEMOGLOBIN A1C
Hgb A1c MFr Bld: 8 %{Hb} — ABNORMAL HIGH (ref <5.7)
Mean Plasma Glucose: 183 mg/dL
eAG (mmol/L): 10.1 mmol/L

## 2023-08-08 LAB — CBC WITH DIFFERENTIAL/PLATELET
Absolute Lymphocytes: 1375 {cells}/uL (ref 850–3900)
Absolute Monocytes: 861 {cells}/uL (ref 200–950)
Basophils Absolute: 87 {cells}/uL (ref 0–200)
Basophils Relative: 1 %
Eosinophils Absolute: 418 {cells}/uL (ref 15–500)
Eosinophils Relative: 4.8 %
HCT: 44 % (ref 38.5–50.0)
Hemoglobin: 14.8 g/dL (ref 13.2–17.1)
MCH: 29.8 pg (ref 27.0–33.0)
MCHC: 33.6 g/dL (ref 32.0–36.0)
MCV: 88.5 fL (ref 80.0–100.0)
MPV: 10.6 fL (ref 7.5–12.5)
Monocytes Relative: 9.9 %
Neutro Abs: 5960 {cells}/uL (ref 1500–7800)
Neutrophils Relative %: 68.5 %
Platelets: 297 10*3/uL (ref 140–400)
RBC: 4.97 10*6/uL (ref 4.20–5.80)
RDW: 14.1 % (ref 11.0–15.0)
Total Lymphocyte: 15.8 %
WBC: 8.7 10*3/uL (ref 3.8–10.8)

## 2023-08-08 LAB — MICROALBUMIN / CREATININE URINE RATIO
Creatinine, Urine: 73 mg/dL (ref 20–320)
Microalb, Ur: <0.2 mg/dL

## 2023-08-08 LAB — PSA: PSA: 0.94 ng/mL (ref <=4.00)

## 2023-08-08 LAB — LIPID PANEL
Cholesterol: 113 mg/dL (ref <200)
HDL: 48 mg/dL (ref >=40)
LDL Cholesterol (Calc): 47 mg/dL
Non-HDL Cholesterol (Calc): 65 mg/dL (ref <130)
Total CHOL/HDL Ratio: 2.4 (calc) (ref <5.0)
Triglycerides: 101 mg/dL (ref <150)

## 2023-08-08 LAB — T4, FREE: Free T4: 1.3 ng/dL (ref 0.8–1.8)

## 2023-08-10 DIAGNOSIS — H6983 Other specified disorders of Eustachian tube, bilateral: Secondary | ICD-10-CM | POA: Diagnosis not present

## 2023-08-10 DIAGNOSIS — J018 Other acute sinusitis: Secondary | ICD-10-CM | POA: Diagnosis not present

## 2023-08-10 DIAGNOSIS — H903 Sensorineural hearing loss, bilateral: Secondary | ICD-10-CM | POA: Diagnosis not present

## 2023-08-14 ENCOUNTER — Encounter: Payer: Self-pay | Admitting: Family Medicine

## 2023-08-21 ENCOUNTER — Other Ambulatory Visit: Payer: Self-pay | Admitting: Cardiovascular Disease

## 2023-09-04 DIAGNOSIS — J302 Other seasonal allergic rhinitis: Secondary | ICD-10-CM | POA: Diagnosis not present

## 2023-09-04 DIAGNOSIS — H6983 Other specified disorders of Eustachian tube, bilateral: Secondary | ICD-10-CM | POA: Diagnosis not present

## 2023-09-04 DIAGNOSIS — J328 Other chronic sinusitis: Secondary | ICD-10-CM | POA: Diagnosis not present

## 2023-09-07 ENCOUNTER — Ambulatory Visit

## 2023-09-07 DIAGNOSIS — Z0000 Encounter for general adult medical examination without abnormal findings: Secondary | ICD-10-CM | POA: Diagnosis not present

## 2023-09-07 NOTE — Patient Instructions (Signed)
 Philip [name] , Thank you for taking time out of your busy schedule to complete your Annual Wellness Visit with me. I enjoyed our conversation and look forward to speaking with you again next year. I, as well as your care team,  appreciate your ongoing commitment to your health goals. Please review the following plan we discussed and let me know if I can assist you in the future.   Follow up Visits: Next Medicare AWV with our clinical staff: 09/12/24 @ 2:00 PM BY PHONE   Have you seen your provider in the last 6 months (3 months if uncontrolled diabetes)? Yes   Clinician Recommendations:  Aim for 30 minutes of exercise or brisk walking, 6-8 glasses of water, and 5 servings of fruits and vegetables each day. TAKE CARE!      This is a list of the screening recommended for you and due dates:  Health Maintenance  Topic Date Due   Zoster (Shingles) Vaccine (1 of 2) Never done   DTaP/Tdap/Td vaccine (2 - Td or Tdap) 05/01/2022   Eye exam for diabetics  06/01/2023   Complete foot exam   08/07/2023   Flu Shot  11/30/2023   Hemoglobin A1C  02/06/2024   Yearly kidney function blood test for diabetes  08/06/2024   Yearly kidney health urinalysis for diabetes  08/06/2024   Medicare Annual Wellness Visit  09/06/2024   Pneumonia Vaccine  Completed   Hepatitis C Screening  Completed   HPV Vaccine  Aged Out   Meningitis B Vaccine  Aged Out   Colon Cancer Screening  Discontinued   COVID-19 Vaccine  Discontinued    Advanced directives: (ACP Link)Information on Advanced Care Planning can be found at [place]  Secretary of State Advance Health Care Directives Advance Health Care Directives. [email or web address]  Advance Care Planning is important because it:  [x]  Makes sure you receive the medical care that is consistent with your values, goals, and preferences  [x]  It provides guidance to your family and loved ones and reduces their decisional burden about whether or not they are making the right decisions  based on your wishes.  Follow the link provided in your after visit summary or read over the paperwork we have mailed to you to help you started getting your Advance Directives in place. If you need assistance in completing these, please reach out to us  so that we can help you!

## 2023-09-07 NOTE — Progress Notes (Signed)
 Subjective:   Philip PEPIN Sr. is a 78 y.o. who presents for a Medicare Wellness preventive visit.  As a reminder, Annual Wellness Visits don't include a physical exam, and some assessments may be limited, especially if this visit is performed virtually. We may recommend an in-person visit if needed.  Visit Complete: Virtual I connected with  Philip High Sr. on 09/07/23 by a audio enabled telemedicine application and verified that I am speaking with the correct person using two identifiers.  Patient Location: Home  Provider Location: Office/Clinic  I discussed the limitations of evaluation and management by telemedicine. The patient expressed understanding and agreed to proceed.  Vital Signs: Because this visit was a virtual/telehealth visit, some criteria may be missing or patient reported. Any vitals not documented were not able to be obtained and vitals that have been documented are patient reported.  VideoDeclined- This patient declined Librarian, academic. Therefore the visit was completed with audio only.  Persons Participating in Visit: Patient.  AWV Questionnaire: No: Patient Medicare AWV questionnaire was not completed prior to this visit.  Cardiac Risk Factors include: advanced age (>18men, >2 women);diabetes mellitus;dyslipidemia;male gender;hypertension     Objective:     There were no vitals filed for this visit. There is no height or weight on file to calculate BMI.     09/07/2023    2:49 PM 08/18/2022    1:10 PM 03/07/2021    7:19 AM 03/02/2021   12:48 PM 01/07/2021    6:31 AM 12/31/2020   11:49 AM 08/03/2020    9:47 AM  Advanced Directives  Does Patient Have a Medical Advance Directive? No No No Yes No No No  Would patient like information on creating a medical advance directive? No - Patient declined No - Patient declined No - Patient declined  No - Patient declined Yes (MAU/Ambulatory/Procedural Areas - Information given)      Current Medications (verified) Outpatient Encounter Medications as of 09/07/2023  Medication Sig   acetaminophen  (TYLENOL ) 500 MG tablet Take 1,000 mg by mouth every 8 (eight) hours as needed for moderate pain or mild pain.   Albuterol  Sulfate 108 (90 Base) MCG/ACT AEPB Inhale 2 puffs into the lungs every 6 (six) hours as needed (shortness of breath).    apixaban  (ELIQUIS ) 5 MG TABS tablet Take 1 tablet by mouth twice daily   ARTIFICIAL TEAR SOLUTION OP Place 1 drop into both eyes daily.   atorvastatin  (LIPITOR) 40 MG tablet Take 1 tablet (40 mg total) by mouth daily. Pt needs to keep upcoming appt in April for additional refills   Blood Glucose Monitoring Suppl (ONE TOUCH ULTRA 2) w/Device KIT Use to check blood sugar as advised up to twice a day   BREZTRI  AEROSPHERE 160-9-4.8 MCG/ACT AERO Inhale 2 puffs into the lungs 2 (two) times daily.   calcium  carbonate (TUMS - DOSED IN MG ELEMENTAL CALCIUM ) 500 MG chewable tablet Chew 1,000 mg by mouth daily as needed for indigestion or heartburn.   cetirizine  (ZYRTEC ) 10 MG tablet Take 10 mg by mouth daily.   cyanocobalamin (VITAMIN B12) 1000 MCG tablet Take 1,000 mcg by mouth daily.   FARXIGA  10 MG TABS tablet Take 1 tablet (10 mg total) by mouth daily before breakfast.   fluticasone  (FLONASE ) 50 MCG/ACT nasal spray Use 2 spray(s) in each nostril once daily   furosemide  (LASIX ) 20 MG tablet Take 1 tablet (20 mg total) by mouth daily.   gabapentin  (NEURONTIN ) 100 MG capsule TAKE 1  CAPSULE BY MOUTH THREE TIMES DAILY   ipratropium-albuterol  (DUONEB) 0.5-2.5 (3) MG/3ML SOLN Take 3 mLs by nebulization every 6 (six) hours as needed (shortness of breath).   LANCETS ULTRA THIN MISC 1 Device by Does not apply route 4 (four) times daily -  before meals and at bedtime.   levothyroxine  (SYNTHROID ) 88 MCG tablet Take 1 tablet (88 mcg total) by mouth daily before breakfast.   metFORMIN  (GLUCOPHAGE ) 1000 MG tablet Take 1 tablet (1,000 mg total) by mouth 2 (two)  times daily with a meal.   metoprolol  tartrate (LOPRESSOR ) 50 MG tablet TAKE 1 & 1/2 (ONE & ONE-HALF) TABLETS BY MOUTH TWICE DAILY   montelukast  (SINGULAIR ) 10 MG tablet Take 1 tablet by mouth at bedtime.   ONETOUCH ULTRA test strip USE TO CHECK GLUCOSE TWICE DAILY   spironolactone  (ALDACTONE ) 25 MG tablet TAKE 1/2 (ONE-HALF) TABLET BY MOUTH ONCE DAILY.  PLEASE CONTACT OFFICE FOR FUTURE APPOINTMENT AND REFILLS   No facility-administered encounter medications on file as of 09/07/2023.    Allergies (verified) Bydureon  [exenatide ], Clindamycin/lincomycin, Penicillins, Sulfa antibiotics, Tiotropium, Penicillin g, Spiriferis, Spiriva  handihaler [tiotropium bromide  monohydrate], Iodinated contrast media, and Other   History: Past Medical History:  Diagnosis Date   (HFpEF) heart failure with preserved ejection fraction (HCC)    a. 06/2015 Echo: EF 50-55%, Gr1 DD. Mild MR. Mildly dil LA. Nl RV fxn. Nl PASP.   Arthritis    a. right knee   Asthma    Atrial tachycardia (HCC)    Chronic anticoagulation    Apixaban    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 etiology.   COPD (chronic obstructive pulmonary disease) (HCC)    Coronary artery disease    a. 05/2012 Cath: severe 3VD-->CABG x 3 by Dr. Nicanor Barge in 01/07/2014Marvin [name], SVG->LCX & SVG->RPDA; b. 06/2014 MV: no ischemia/infarct.   DDD (degenerative disc disease), lumbar    Dyspnea    Edema    LEGS/ FEET   Essential hypertension    History of Helicobacter pylori infection    HNP (herniated nucleus pulposus), lumbar    HOH (hard of hearing)    Hypercholesteremia    Hypothyroidism    Low back pain radiating to lower extremity    Lumbar radiculitis    Lumbar stenosis with neurogenic claudication    Neuropathy    PAF (paroxysmal atrial fibrillation) (HCC)    a. CHA2DS2VASc = 5 (age, CHF, HTN, vascular disase, T2DM) --> Eliquis .   Sepsis (HCC) 2021   Shingles    Sleep apnea    NO CPAP   Type II diabetes mellitus [facility])    Past Surgical  History:  Procedure Laterality Date   ANTERIOR CERVICAL DECOMP/DISCECTOMY FUSION  ~ 2009   CARDIAC CATHETERIZATION  05/06/2012   Significant three-vessel coronary artery disease with normal ejection fraction   CATARACT EXTRACTION W/PHACO Right 08/15/2017   Procedure: CATARACT EXTRACTION PHACO AND INTRAOCULAR LENS PLACEMENT (IOC);  Surgeon: Clair Crews, MD;  Location: [facility] ORS;  Service: Ophthalmology;  Laterality: Right;  US  00:23 AP% 13.3 CDE 3.11 Fluid pack lot # [id number]   CATARACT EXTRACTION W/PHACO Left 10/03/2017   Procedure: CATARACT EXTRACTION PHACO AND INTRAOCULAR LENS PLACEMENT (IOC);  Surgeon: Clair Crews, MD;  Location: [facility] ORS;  Service: Ophthalmology;  Laterality: Left;  Lot #[id number] H US : 00:21.2 AP%:13.9 CDE: 2.96   CORONARY ARTERY BYPASS GRAFT  05/07/2012   Procedure: CORONARY ARTERY BYPASS GRAFTING (CABG);  Surgeon: Norita Beauvais, MD;  Location: [facility] OR;  Service: Open Heart  Surgery;  Laterality: N/A;  times three   INTRAOPERATIVE TRANSESOPHAGEAL ECHOCARDIOGRAM  05/07/2012   Procedure: INTRAOPERATIVE TRANSESOPHAGEAL ECHOCARDIOGRAM;  Surgeon: Norita Beauvais, MD;  Location: [facility] OR;  Service: Open Heart Surgery;  Laterality: N/A;   KNEE ARTHROPLASTY Right 03/07/2021   Procedure: COMPUTER ASSISTED TOTAL RIGHT KNEE ARTHROPLASTY -;  Surgeon: Arlyne Lame, MD;  Location: [facility] ORS;  Service: Orthopedics;  Laterality: Right;   KNEE ARTHROSCOPY Right ~ 2000   KNEE SURGERY Right 2003   LUMBAR LAMINECTOMY/DECOMPRESSION MICRODISCECTOMY Right 01/07/2021   Procedure: RIGHT L3-4 MICRODISCECTOMY 1 LEVEL;  Surgeon: Jodeen Munch, MD;  Location: [facility] ORS;  Service: Neurosurgery;  Laterality: Right;   TEE WITHOUT CARDIOVERSION N/A 07/04/2019   Procedure: TRANSESOPHAGEAL ECHOCARDIOGRAM (TEE);  Surgeon: Sammy Crisp, MD;  Location: [facility] ORS;  Service: Cardiovascular;  Laterality: N/A;   Family History  Problem Relation Age of Onset   Heart disease Mother     Clotting disorder Mother    Hypertension Sister    Social History   Socioeconomic History   Marital status: Married    Spouse name: Janus Mercury   Number of children: 4   Years of education: Not on file   Highest education level: Some college, no degree  Occupational History   Occupation: retired  Tobacco Use   Smoking status: Former    Current packs/day: 0.00    Average packs/day: 1 pack/day for 8.0 years (8.0 ttl pk-yrs)    Types: Cigarettes    Start date: 12/31/1959    Quit date: 12/31/1967    Years since quitting: 55.7   Smokeless tobacco: Former  Building services engineer status: Never Used  Substance and Sexual Activity   Alcohol  use: No    Alcohol /week: 0.0 standard drinks of alcohol     Comment: 05/06/2012 "last alcohol  several years ago; never had problem wit"   Drug use: No   Sexual activity: Not Currently  Other Topics Concern   Not on file  Social History Narrative   ** Merged History Encounter **          Working part time - not currently working because of covid-19    Social Drivers of Corporate investment banker Strain: Low Risk  (09/07/2023)   Overall Financial Resource Strain (CARDIA)    Difficulty of Paying Living Expenses: Not very hard  Food Insecurity: No Food Insecurity (09/07/2023)   Hunger Vital Sign    Worried About Running Out of Food in the Last Year: Never true    Ran Out of Food in the Last Year: Never true  Transportation Needs: No Transportation Needs (09/07/2023)   PRAPARE - Administrator, Civil Service (Medical): No    Lack of Transportation (Non-Medical): No  Physical Activity: Insufficiently Active (09/07/2023)   Exercise Vital Sign    Days of Exercise per Week: 3 days    Minutes of Exercise per Session: 30 min  Stress: No Stress Concern Present (09/07/2023)   [employer] of Occupational Health - Occupational Stress Questionnaire    Feeling of Stress : Only a little  Social Connections: Moderately Integrated (09/07/2023)   Social  Connection and Isolation Panel [NHANES]    Frequency of Communication with Friends and Family: More than three times a week    Frequency of Social Gatherings with Friends and Family: Twice a week    Attends Religious Services: More than 4 times per year    Active Member of [employer] or Organizations: No    Attends [employer]  or Organization Meetings: Never    Marital Status: Married    Tobacco Counseling Counseling given: Not Answered    Clinical Intake:  Pre-visit preparation completed: Yes  Pain : No/denies pain     BMI - recorded: 26.7 Nutritional Status: BMI 25 -29 Overweight Nutritional Risks: None Diabetes: Yes CBG done?: No Did pt. bring in CBG monitor from home?: No  Lab Results  Component Value Date   HGBA1C 8.0 (H) 08/07/2023   HGBA1C 6.6 (A) 02/06/2023   HGBA1C 7.2 (H) 08/07/2022     How often do you need to have someone help you when you read instructions, pamphlets, or other written materials from your doctor or pharmacy?: 1 - Never  Interpreter Needed?: No  Information entered by :: Dellie Fergusson, LPN   Activities of Daily Living    09/07/2023    2:50 PM 03/09/2023    2:10 PM  In your present state of health, do you have any difficulty performing the following activities:  Hearing? 1 1  Vision? 0 1  Difficulty concentrating or making decisions? 0 0  Walking or climbing stairs? 1 1  Dressing or bathing? 0 0  Doing errands, shopping? 0 0  Preparing Food and eating ? N   Using the Toilet? N   In the past six months, have you accidently leaked urine? N   Do you have problems with loss of bowel control? N   Managing your Medications? N   Managing your Finances? N   Housekeeping or managing your Housekeeping? N     Patient Care Team: Raina Bunting, DO as PCP - General (Family Medicine) Wenona Hamilton, MD as PCP - Cardiology (Cardiology) Wenona Hamilton, MD as Consulting Physician (Cardiology) Josephus Nida, MD (Inactive) (Orthopedic  Surgery) Adelaida Holts, MD as Referring Physician (Specialist) Alla Isaacs, Severa Daniels, RPH-CPP as Pharmacist Pa, [facility] (Optometry)  Indicate any recent Medical Services you may have received from other than [facility] providers in the past year (date may be approximate).     Assessment:    This is a routine wellness examination for Philip [name].  Hearing/Vision screen Hearing Screening - Comments:: NO AIDS Vision Screening - Comments:: READERS- [facility]   Goals Addressed             This Visit's Progress    DIET - REDUCE SUGAR INTAKE         Depression Screen     09/07/2023    2:47 PM 07/23/2023   11:29 AM 03/09/2023    2:09 PM 02/06/2023    9:32 AM 08/18/2022    1:09 PM 08/07/2022    8:54 AM 04/28/2022    3:19 PM  PHQ 2/9 Scores  PHQ - 2 Score 0 1 0 1 0 0 0  PHQ- 9 Score 0 4 5 2  0 2 0    Fall Risk     09/07/2023    2:50 PM 07/23/2023   11:29 AM 03/09/2023    2:10 PM 02/06/2023    9:33 AM 08/18/2022    1:11 PM  Fall Risk   Falls in the past year? 0 0 0 0 0  Number falls in past yr: 0    0  Injury with Fall? 0   0 0  Risk for fall due to : No Fall Risks    No Fall Risks  Follow up Falls prevention discussed;Falls evaluation completed    Falls prevention discussed;Falls evaluation completed    MEDICARE RISK AT HOME:  Medicare Risk at Home Any stairs in or around the home?: Yes If so, are there any without handrails?: No Home free of loose throw rugs in walkways, pet beds, electrical cords, etc?: Yes Adequate lighting in your home to reduce risk of falls?: Yes Life alert?: No Use of a cane, walker or w/c?: No Grab bars in the bathroom?: No Shower chair or bench in shower?: Yes Elevated toilet seat or a handicapped toilet?: Yes  TIMED UP AND GO:  Was the test performed?  No  Cognitive Function: 6CIT completed    01/06/2015    2:01 PM  MMSE - Mini Mental State Exam  Orientation to time 5  Orientation to Place 5  Registration 3  Attention/ Calculation  5  Recall 3  Language- name 2 objects 2  Language- repeat 1  Language- follow 3 step command 3  Language- read & follow direction 1  Write a sentence 1  Copy design 1  Total score 30        09/07/2023    2:52 PM 08/18/2022    1:19 PM 08/11/2021    1:21 PM 08/03/2020    9:51 AM 12/31/2018    8:57 AM  6CIT Screen  What Year? 0 points 0 points 0 points 0 points 0 points  What month? 0 points 0 points 0 points 0 points 0 points  What time? 0 points 0 points 0 points 0 points 0 points  Count back from 20 0 points 0 points 0 points 0 points 0 points  Months in reverse 0 points 0 points 0 points 0 points 0 points  Repeat phrase 2 points 2 points 2 points 0 points 0 points  Total Score 2 points 2 points 2 points 0 points 0 points    Immunizations Immunization History  Administered Date(s) Administered   Fluad Quad(high Dose 65+) 12/31/2018, 01/31/2021   Fluad Trivalent(High Dose 65+) 02/06/2023   Influenza, High Dose Seasonal PF 01/06/2015, 02/28/2017   Influenza-Unspecified 02/08/2014, 03/01/2018   Pneumococcal Conjugate-13 01/02/2014   Pneumococcal Polysaccharide-23 05/01/2012   Tdap 05/01/2012    Screening Tests Health Maintenance  Topic Date Due   Zoster Vaccines- Shingrix (1 of 2) Never done   DTaP/Tdap/Td (2 - Td or Tdap) 05/01/2022   OPHTHALMOLOGY EXAM  06/01/2023   FOOT EXAM  08/07/2023   INFLUENZA VACCINE  11/30/2023   HEMOGLOBIN A1C  02/06/2024   Diabetic kidney evaluation - eGFR measurement  08/06/2024   Diabetic kidney evaluation - Urine ACR  08/06/2024   Medicare Annual Wellness (AWV)  09/06/2024   Pneumonia Vaccine 37+ Years old  Completed   Hepatitis C Screening  Completed   HPV VACCINES  Aged Out   Meningococcal B Vaccine  Aged Out   Colonoscopy  Discontinued   COVID-19 Vaccine  Discontinued    Health Maintenance  Health Maintenance Due  Topic Date Due   Zoster Vaccines- Shingrix (1 of 2) Never done   DTaP/Tdap/Td (2 - Td or Tdap) 05/01/2022    OPHTHALMOLOGY EXAM  06/01/2023   FOOT EXAM  08/07/2023   Health Maintenance Items Addressed: UP TO DATE ON SHOTS EXCEPT TDAP & SHINGRIX; AGED OUT OF COLONOSCOPY  Additional Screening:  Vision Screening: Recommended annual ophthalmology exams for early detection of glaucoma and other disorders of the eye.  Dental Screening: Recommended annual dental exams for proper oral hygiene  Community Resource Referral / Chronic Care Management: CRR required this visit?  No   CCM required this visit?  No   Plan:  I have personally reviewed and noted the following in the patient's chart:   Medical and social history Use of alcohol , tobacco or illicit drugs  Current medications and supplements including opioid prescriptions. Patient is not currently taking opioid prescriptions. Functional ability and status Nutritional status Physical activity Advanced directives List of other physicians Hospitalizations, surgeries, and ER visits in previous 12 months Vitals Screenings to include cognitive, depression, and falls Referrals and appointments  In addition, I have reviewed and discussed with patient certain preventive protocols, quality metrics, and best practice recommendations. A written personalized care plan for preventive services as well as general preventive health recommendations were provided to patient.   Pinky Bright, LPN   0/12/8117   After Visit Summary: (MyChart) Due to this being a telephonic visit, the after visit summary with patients personalized plan was offered to patient via MyChart   Notes: Nothing significant to report at this time.

## 2023-09-11 ENCOUNTER — Other Ambulatory Visit: Payer: Self-pay | Admitting: Family Medicine

## 2023-09-11 DIAGNOSIS — E034 Atrophy of thyroid (acquired): Secondary | ICD-10-CM

## 2023-09-13 NOTE — Telephone Encounter (Signed)
 Requested Prescriptions  Pending Prescriptions Disp Refills   levothyroxine  (SYNTHROID ) 88 MCG tablet [Pharmacy Med Name: LEVOTHYROXIN  TAB] 90 tablet 3    Sig: TAKE 1 TABLET BY MOUTH DAILY BEFORE BREAKFAST     Endocrinology:  Hypothyroid Agents Passed - 09/13/2023 11:14 AM      Passed - TSH in normal range and within 360 days    TSH  Date Value Ref Range Status  08/07/2023 4.33 0.40 - 4.50 mIU/L Final         Passed - Valid encounter within last 12 months    Recent Outpatient Visits           1 month ago Acute non-recurrent frontal sinusitis   [facility] Raina Bunting, DO   2 months ago Left wrist pain   [facility] [place], Rankin Buzzard, NP       Future Appointments             In 4 months Furth, [name], [name] [employer] [facility] at [place]

## 2023-09-24 ENCOUNTER — Other Ambulatory Visit: Payer: Self-pay | Admitting: Family Medicine

## 2023-09-24 DIAGNOSIS — E1142 Type 2 diabetes mellitus with diabetic polyneuropathy: Secondary | ICD-10-CM

## 2023-09-27 NOTE — Telephone Encounter (Signed)
 Requested medications are due for refill today.  yes  Requested medications are on the active medications list.  yes  Last refill. 05/30/2023 #200 0 rf  Future visit scheduled.   no  Notes to clinic.  Not on med list.    Requested Prescriptions  Pending Prescriptions Disp Refills   ONETOUCH ULTRA test strip [Pharmacy Med Name: OneTouch Ultra Blue In Vitro Strip] 200 each 0    Sig: USE TO CHECK GLUCOSE TWICE DAILY     Endocrinology: Diabetes - Testing Supplies Passed - 09/27/2023 12:29 PM      Passed - Valid encounter within last 12 months    Recent Outpatient Visits           2 months ago Acute non-recurrent frontal sinusitis   [facility] Raina Bunting, DO   3 months ago Left wrist pain   [facility] [place], Rankin Buzzard, NP       Future Appointments             In 4 months Furth, [name], [name] [employer] at [place]

## 2023-10-01 ENCOUNTER — Other Ambulatory Visit: Admitting: Pharmacist

## 2023-10-01 DIAGNOSIS — Z7984 Long term (current) use of oral hypoglycemic drugs: Secondary | ICD-10-CM

## 2023-10-01 DIAGNOSIS — E1142 Type 2 diabetes mellitus with diabetic polyneuropathy: Secondary | ICD-10-CM

## 2023-10-01 DIAGNOSIS — I129 Hypertensive chronic kidney disease with stage 1 through stage 4 chronic kidney disease, or unspecified chronic kidney disease: Secondary | ICD-10-CM

## 2023-10-01 DIAGNOSIS — E119 Type 2 diabetes mellitus without complications: Secondary | ICD-10-CM

## 2023-10-01 NOTE — Progress Notes (Signed)
 10/01/2023 Name: Philip BOZARD Sr. MRN: [id number] DOB: June 06, 1945  Chief Complaint  Patient presents with   Medication Management    [name]. is a 78 y.o. year old male who presented for a telephone visit.   They were referred to the pharmacist by their PCP for assistance in managing diabetes, hypertension, hyperlipidemia, and medication access.    Subjective:   Care Team: Primary Care Provider: Raina Bunting, DO Cardiologist: Wenona Hamilton, MD; Next Scheduled Visit: 02/01/2024 Pulmonologist: Adelaida Holts, MD; Next Scheduled Visit: 01/25/2024 Orthopedics: Baxter Limber, MD  Medication Access/Adherence  Current Pharmacy:  [facility] [address] ([address]), [place] - [address] [place] ([place]) [place] [address] Phone: [phone] Fax: [phone]   Patient reports affordability concerns with their medications: No  Patient reports access/transportation concerns to their pharmacy: No  Patient reports adherence concerns with their medications:  No     Patient uses weekly pillbox     Type 2 Diabetes:   Current medications: metformin  1,000 mg twice daily - Reports has been out of his metformin  for >1 week Farxiga  10 mg - 1 tablet QAM     Current glucose readings:   Fasting Blood Glucose After Supper/Bedtime  27 - May 164 -  28 - May 186 291  29 - May 328* -  30 - May 166 -  31 - May 182 319  1 - June 167 203  2 - June 193     * Attributes elevated readings to being out of his metformin  as well as portion sizes of carbohydrates, such as rice or pasta, and sweet tea with supper  Reports tried to obtain a refill of metformin  from his [employer], but was told that the refill was too soon. Thinks that he misplaced a bottle of the metformin  at home, but has been unable to find it   Reports using blood sugar readings as feedback on dietary choices   Denies symptoms of hypoglycemia    Current medication access support: enrolled in patient assistance for Farxiga  from [employer] through 04/30/2024      CHF/Hypertension/ Atrial Fibrillation:     Current medications:  Metoprolol  75 mg twice daily Spironolactone  25 mg - 1/2 tablet (12.5 mg) daily  Farxiga  10 mg - 1 tablets daily Furosemide  20 mg daily as needed   Anticoagulation: Eliquis  5 mg twice daily     Patient has an automated, upper arm home BP cuff Current blood pressure readings readings:  - Last checked today: 103/84, HR 79 - Yesterday evening: 107/70, HR 72; morning: 107/75, HR 75   Denies symptoms of hypotension such as lightheadedness or dizziness  Denies symptoms of swelling   Confirms monitoring home weight daily as directed by Cardiology; reports home weight stable ~176 lbs    Current medication access support: enrolled in patient assistance for Farxiga  from [employer] through 04/30/2024       Objective:  Lab Results  Component Value Date   HGBA1C 8.0 (H) 08/07/2023    Lab Results  Component Value Date   CREATININE 1.16 08/07/2023   BUN 21 08/07/2023   NA 139 08/07/2023   K 4.9 08/07/2023   CL 101 08/07/2023   CO2 29 08/07/2023    Lab Results  Component Value Date   CHOL 113 08/07/2023   HDL 48 08/07/2023   LDLCALC 47 08/07/2023   TRIG 101 08/07/2023   CHOLHDL 2.4 08/07/2023   BP Readings from Last  3 Encounters:  08/06/23 100/70  07/23/23 122/84  06/18/23 124/70   Pulse Readings from Last 3 Encounters:  08/06/23 89  07/23/23 85  05/29/23 88     Medications Reviewed Today     Reviewed by Ardis Becton, RPH-CPP (Pharmacist) on 10/01/23 at [phone]  Med List Status: <None>   Medication Order Taking? Sig Documenting Provider Last Dose Status Informant  acetaminophen  (TYLENOL ) 500 MG tablet [id number]  Take 1,000 mg by mouth every 8 (eight) hours as needed for moderate pain or mild pain. [provider]  Active Self  Albuterol  Sulfate 108 (90 Base) MCG/ACT AEPB  [id number]  Inhale 2 puffs into the lungs every 6 (six) hours as needed (shortness of breath).  [provider]  Active Self  apixaban  (ELIQUIS ) 5 MG TABS tablet [id number]  Take 1 tablet by mouth twice daily Wenona Hamilton, MD  Active   ARTIFICIAL TEAR SOLUTION OP [id number]  Place 1 drop into both eyes daily. [provider]  Active Self  atorvastatin  (LIPITOR) 40 MG tablet [id number]  Take 1 tablet (40 mg total) by mouth daily. Pt needs to keep upcoming appt in April for additional refills Wenona Hamilton, MD  Active   Blood Glucose Monitoring Suppl (ONE TOUCH ULTRA 2) w/Device KIT [id number]  Use to check blood sugar as advised up to twice a day Raina Bunting, DO  Active Self  BREZTRI  AEROSPHERE 160-9-4.8 MCG/ACT Sudie Ely [id number]  Inhale 2 puffs into the lungs 2 (two) times daily. [name], Kayleen Party, DO  Active   calcium  carbonate (TUMS - DOSED IN MG ELEMENTAL CALCIUM ) 500 MG chewable tablet [id number]  Chew 1,000 mg by mouth daily as needed for indigestion or heartburn. [provider]  Active Self  cetirizine  (ZYRTEC ) 10 MG tablet [id number]  Take 10 mg by mouth daily. [provider]  Active Self  cyanocobalamin (VITAMIN B12) 1000 MCG tablet [id number]  Take 1,000 mcg by mouth daily. [provider]  Active   FARXIGA  10 MG TABS tablet [id number] Yes Take 1 tablet (10 mg total) by mouth daily before breakfast. Raina Bunting, DO Taking Active   fluticasone  (FLONASE ) 50 MCG/ACT nasal spray [id number]  Use 2 spray(s) in each nostril once daily Raina Bunting, DO  Active   furosemide  (LASIX ) 20 MG tablet [id number] Yes Take 1 tablet (20 mg total) by mouth daily.  Patient taking differently: Take 20 mg by mouth daily as needed.   Gennaro Khat, [name], [name] Taking Active   gabapentin  (NEURONTIN ) 100 MG capsule [id number]  TAKE 1 CAPSULE BY MOUTH THREE TIMES DAILY [name], Kayleen Party, DO  Active   ipratropium-albuterol  (DUONEB)  0.5-2.5 (3) MG/3ML SOLN [id number]  Take 3 mLs by nebulization every 6 (six) hours as needed (shortness of breath). [provider]  Active Self           Med Note Philip [name] A   Fri Dec 24, 2020  4:04 PM)    Kyung Pi THIN MISC [id number]  1 Device by Does not apply route 4 (four) times daily -  before meals and at bedtime. Norita Beauvais, MD  Active Self  levothyroxine  (SYNTHROID ) 88 MCG tablet [id number]  TAKE 1 TABLET BY MOUTH DAILY BEFORE BREAKFAST [name], Kayleen Party, DO  Active   metFORMIN  (GLUCOPHAGE ) 1000 MG tablet [id number]  Take 1 tablet (1,000 mg total) by mouth 2 (two) times daily with a meal. [name], Kayleen Party, DO  Active   metoprolol  tartrate (LOPRESSOR ) 50 MG tablet  [id number] Yes TAKE 1 & 1/2 (ONE & ONE-HALF) TABLETS BY MOUTH TWICE DAILY Wenona Hamilton, MD Taking Active   montelukast  (SINGULAIR ) 10 MG tablet [id number]  Take 1 tablet by mouth at bedtime. [provider]  Active   Georgianne Kirsten test strip [id number]  USE TO CHECK GLUCOSE TWICE DAILY Raina Bunting, DO  Active   spironolactone  (ALDACTONE ) 25 MG tablet [id number] Yes TAKE 1/2 (ONE-HALF) TABLET BY MOUTH ONCE DAILY.  PLEASE CONTACT OFFICE FOR FUTURE APPOINTMENT AND REFILLS Wenona Hamilton, MD Taking Active               Assessment/Plan:   Outreach to [employer] on behalf of patient. Find that next refill of metformin  available thorugh patient's insurance at the end of the month.  Request pharmacy fill metformin  prescription for 1 month supply outside of insurance to last patient until then. [facility] advises that cost will be $9  Counsel on importance of medication adherence. Advise patient to contact pharmacist or office sooner if unable to obtain a refill of his medication or having other medication-related concerns  Diabetes: - Counsel patient that he may restart metformin  1000 mg by taking 1/2 tablet twice daily with meals for 1 week before increasing  back to 1 tablet twice daily to aid with medication tolerability - Counseled on importance of continuing to control carbohydrate portion sizes and using blood sugar checks to reinforce dietary choices - Encourage patient to limit sweets and sugary beverages (sweet tea, Pepsi) - Recommend to continue to check glucose, keep log of results, to bring this record to medical appointments and use readings as feedback on dietary choices - Patient to follow up with [employer] assistance program as needed regarding refills of Farxiga    CHF/Hypertension/Atrial Fibrillation: - Encourage patient to continue to weigh daily - Recommended to continue to check home blood pressure and heart rate, keep log of results and contact Cardiologist for BP and HR if needed for readings outside of established parameters or symptoms      Follow Up Plan: Clinical Pharmacist will follow up with patient by telephone on 10/24/2023 at 11:00 AM      Arthur Lash, PharmD, Becky Bowels, CPP Clinical Pharmacist [facility] [phone]

## 2023-10-02 DIAGNOSIS — J328 Other chronic sinusitis: Secondary | ICD-10-CM | POA: Diagnosis not present

## 2023-10-02 NOTE — Patient Instructions (Signed)
Goals Addressed             This Visit's Progress    Pharmacy Goals       Our goal A1c is less than 7%. This corresponds with fasting sugars less than 130 and 2 hour after meal sugars less than 180. Please check your blood sugar and keep log of results  Please continue to monitor your home weight, blood pressure and heart rate  Our goal bad cholesterol, or LDL, is less than 70 . This is why it is important to continue taking your atorvastatin  Feel free to call me with any questions or concerns. I look forward to our next call!    [name], PharmD, [facility] Clinical Pharmacist [facility] [phone]

## 2023-10-15 DIAGNOSIS — J324 Chronic pansinusitis: Secondary | ICD-10-CM | POA: Diagnosis not present

## 2023-10-15 DIAGNOSIS — J328 Other chronic sinusitis: Secondary | ICD-10-CM | POA: Diagnosis not present

## 2023-10-15 DIAGNOSIS — R0982 Postnasal drip: Secondary | ICD-10-CM | POA: Diagnosis not present

## 2023-10-17 ENCOUNTER — Other Ambulatory Visit: Payer: Self-pay

## 2023-10-17 ENCOUNTER — Other Ambulatory Visit: Payer: Self-pay | Admitting: Family Medicine

## 2023-10-17 DIAGNOSIS — E1142 Type 2 diabetes mellitus with diabetic polyneuropathy: Secondary | ICD-10-CM

## 2023-10-17 DIAGNOSIS — J0110 Acute frontal sinusitis, unspecified: Secondary | ICD-10-CM

## 2023-10-17 MED ORDER — FLUTICASONE PROPIONATE 50 MCG/ACT NA SUSP: 2.0000 | Freq: Every day | NASAL | 0 refills | Status: DC

## 2023-10-19 NOTE — Telephone Encounter (Signed)
 Requested Prescriptions  Pending Prescriptions Disp Refills   gabapentin  (NEURONTIN ) 100 MG capsule [Pharmacy Med Name: Gabapentin  100 MG Oral Capsule] 270 capsule 0    Sig: TAKE 1 CAPSULE BY MOUTH THREE TIMES DAILY     Neurology: Anticonvulsants - gabapentin  Passed - 10/19/2023  1:11 PM      Passed - Cr in normal range and within 360 days    Creat  Date Value Ref Range Status  08/07/2023 1.16 0.70 - 1.28 mg/dL Final   Creatinine, Urine  Date Value Ref Range Status  08/07/2023 73 20 - 320 mg/dL Final         Passed - Completed PHQ-2 or PHQ-9 in the last 360 days      Passed - Valid encounter within last 12 months    Recent Outpatient Visits           2 months ago Acute non-recurrent frontal sinusitis   [place] [facility] [place], Kayleen Party, DO   4 months ago Left wrist pain   [place] [facility] [place], [place], NP       Future Appointments             In 3 months Furth, [name], [name] [facility]  (FLONASE ) 50 MCG/ACT nasal spray [Pharmacy Med Name: Fluticasone  Propionate 50 MCG/ACT Nasal Suspension] 16 g 0    Sig: Use 2 spray(s) in each nostril once daily     Ear, Nose, and Throat: Nasal Preparations - Corticosteroids Passed - 10/19/2023  1:11 PM      Passed - Valid encounter within last 12 months    Recent Outpatient Visits           2 months ago Acute non-recurrent frontal sinusitis   [place] [facility] [place], Kayleen Party, DO   4 months ago Left wrist pain   [facility] [place], Rankin Buzzard, NP       Future Appointments             In 3 months Furth, [name], [name] [facility] [facility] at [place]

## 2023-10-24 ENCOUNTER — Other Ambulatory Visit: Admitting: Pharmacist

## 2023-10-24 DIAGNOSIS — E119 Type 2 diabetes mellitus without complications: Secondary | ICD-10-CM

## 2023-10-24 DIAGNOSIS — Z7984 Long term (current) use of oral hypoglycemic drugs: Secondary | ICD-10-CM

## 2023-10-24 DIAGNOSIS — E1142 Type 2 diabetes mellitus with diabetic polyneuropathy: Secondary | ICD-10-CM

## 2023-10-24 NOTE — Patient Instructions (Addendum)
Goals Addressed             This Visit's Progress    Pharmacy Goals       Our goal A1c is less than 7%. This corresponds with fasting sugars less than 130 and 2 hour after meal sugars less than 180. Please check your blood sugar and keep log of results  Please continue to monitor your home weight, blood pressure and heart rate  Our goal bad cholesterol, or LDL, is less than 70 . This is why it is important to continue taking your atorvastatin  Feel free to call me with any questions or concerns. I look forward to our next call!    [name], PharmD, [facility] Clinical Pharmacist [facility] [phone]

## 2023-10-24 NOTE — Progress Notes (Signed)
 10/24/2023 Name: Philip WENDT Sr. MRN: [id number] DOB: 06-18-1945  Chief Complaint  Patient presents with   Medication Management   Medication Adherence    Philip LASKY Sr. is a 78 y.o. year old male who presented for a telephone visit.   They were referred to the pharmacist by their PCP for assistance in managing diabetes, hypertension, hyperlipidemia, and medication access.    Subjective:   Care Team: Primary Care Provider: Edman Marsa PARAS, DO Cardiologist: Darron Deatrice LABOR, MD; Next Scheduled Visit: 02/01/2024 Pulmonologist: Theotis Lavelle BRAVO, MD; Next Scheduled Visit: 01/25/2024 Orthopedics: Mardee Lynwood Lucy, MD  Medication Access/Adherence  Current Pharmacy:  [facility] [address] ([address]), [place] - [address] [place]) [place] [address] Phone: [phone] Fax: [phone]   Patient reports affordability concerns with their medications: No  Patient reports access/transportation concerns to their pharmacy: No  Patient reports adherence concerns with their medications:  No     Patient uses weekly pillbox  Reports has upcoming appointment with [facility] on 10/30/2023 to discuss sinus surgery     Type 2 Diabetes:   Current medications: metformin  1,000 mg twice daily - Restarted ~10/01/2023 Farxiga  10 mg - 1 tablet QAM     Current glucose readings:   Fasting Blood Glucose After Supper/Bedtime  19 - June  147 160  20 - June 131 193  21 - June 169 208*  22 - June 142 276*  23 - June 129 159  24 - June 123 236*  25 - June 123     * Attributes elevated readings to eating oatmeal cookies in evenings   Reports using blood sugar readings as feedback on dietary choices   Denies symptoms of hypoglycemia   Current medication access support: enrolled in patient assistance for Farxiga  from [employer]Me through 04/30/2024     Objective:  Lab Results  Component Value Date   HGBA1C 8.0 (H)  08/07/2023    Lab Results  Component Value Date   CREATININE 1.16 08/07/2023   BUN 21 08/07/2023   NA 139 08/07/2023   K 4.9 08/07/2023   CL 101 08/07/2023   CO2 29 08/07/2023    Lab Results  Component Value Date   CHOL 113 08/07/2023   HDL 48 08/07/2023   LDLCALC 47 08/07/2023   TRIG 101 08/07/2023   CHOLHDL 2.4 08/07/2023    Medications Reviewed Today     Reviewed by Alana Sharyle LABOR, RPH-CPP (Pharmacist) on 10/24/23 at 1124  Med List Status: <None>   Medication Order Taking? Sig Documenting Provider Last Dose Status Informant  acetaminophen  (TYLENOL ) 500 MG tablet [id number]  Take 1,000 mg by mouth every 8 (eight) hours as needed for moderate pain or mild pain. [provider]  Active Self  Albuterol  Sulfate 108 (90 Base) MCG/ACT AEPB [id number]  Inhale 2 puffs into the lungs every 6 (six) hours as needed (shortness of breath).  [provider]  Active Self  apixaban  (ELIQUIS ) 5 MG TABS tablet [id number]  Take 1 tablet by mouth twice daily Darron Deatrice LABOR, MD  Active   ARTIFICIAL TEAR SOLUTION OP [id number]  Place 1 drop into both eyes daily. [provider]  Active Self  atorvastatin  (LIPITOR) 40 MG tablet [id number]  Take 1 tablet (40 mg total) by mouth daily. Pt needs to keep upcoming appt in April for additional refills Darron Deatrice LABOR, MD  Active   Blood Glucose Monitoring Suppl (ONE TOUCH ULTRA 2) w/Device KIT [id number]  Use to check blood sugar as advised up to twice a day Edman Marsa PARAS, DO  Active Self  BREZTRI  AEROSPHERE 160-9-4.8 MCG/ACT TERESE [id number]  Inhale 2 puffs into the lungs 2 (two) times daily. [name], Marsa PARAS, DO  Active   calcium  carbonate (TUMS - DOSED IN MG ELEMENTAL CALCIUM ) 500 MG chewable tablet [id number]  Chew 1,000 mg by mouth daily as needed for indigestion or heartburn. [provider]  Active Self  cetirizine  (ZYRTEC ) 10 MG tablet [id number]  Take 10 mg by mouth daily. [provider]  Active Self  cyanocobalamin (VITAMIN B12) 1000 MCG tablet [id number]  Take 1,000 mcg by mouth daily. [provider]  Active   FARXIGA  10 MG TABS tablet [id number] Yes Take 1 tablet (10 mg total) by mouth daily before breakfast. Edman Marsa PARAS, DO  Active   fluticasone  (FLONASE ) 50 MCG/ACT nasal spray [id number]  Place 2 sprays into both nostrils daily. Edman Marsa PARAS, DO  Active   furosemide  (LASIX ) 20 MG tablet [id number]  Take 1 tablet (20 mg total) by mouth daily.  Patient taking differently: Take 20 mg by mouth daily as needed.   [place], [name], [name]  Active   gabapentin  (NEURONTIN ) 100 MG capsule [id number]  TAKE 1 CAPSULE BY MOUTH THREE TIMES DAILY [name], Marsa PARAS, DO  Active   ipratropium-albuterol  (DUONEB) 0.5-2.5 (3) MG/3ML SOLN [id number]  Take 3 mLs by nebulization every 6 (six) hours as needed (shortness of breath). [provider]  Active Self           Med Note JERALYN DUNCANS A   Fri Dec 24, 2020  4:04 PM)    IVER FLING THIN MISC [id number]  1 Device by Does not apply route 4 (four) times daily -  before meals and at bedtime. Army Dallas NOVAK, MD  Active Self  levothyroxine  (SYNTHROID ) 88 MCG tablet [id number]  TAKE 1 TABLET BY MOUTH DAILY BEFORE BREAKFAST [name], Marsa PARAS, DO  Active   metFORMIN  (GLUCOPHAGE ) 1000 MG tablet [id number] Yes Take 1 tablet (1,000 mg total) by mouth 2 (two) times daily with a meal. Edman, Marsa PARAS, DO  Active   metoprolol  tartrate (LOPRESSOR ) 50 MG tablet [id number]  TAKE 1 & 1/2 (ONE & ONE-HALF) TABLETS BY MOUTH TWICE DAILY Darron Deatrice LABOR, MD  Active   montelukast  (SINGULAIR ) 10 MG tablet [id number]  Take 1 tablet by mouth at bedtime. [provider]  Active   AISHA FLING test strip [id number]  USE TO CHECK GLUCOSE TWICE DAILY Edman Marsa PARAS, DO  Active   spironolactone  (ALDACTONE ) 25 MG tablet [id number]  TAKE 1/2 (ONE-HALF) TABLET BY MOUTH ONCE DAILY.  PLEASE CONTACT  OFFICE FOR FUTURE APPOINTMENT AND REFILLS Darron Deatrice LABOR, MD  Active               Assessment/Plan:   Patient to contact office to reschedule missed appointment with PCP  Patient plans to make [facility] aware of his current medications, including Eliquis .   Diabetes: - Counseled on importance of continuing to control carbohydrate portion sizes and using blood sugar checks to reinforce dietary choices Encourage patient to limit sweets and sugary beverages (sweet tea, Pepsi) Discuss balanced snack ideas to eat in place of oatmeal cookies - Recommend to continue to check glucose, keep log of results, to bring this record to medical appointments and use readings as feedback on dietary choices - Patient to follow up with AZ&Me assistance program as needed regarding refills of Farxiga   Follow Up Plan: Clinical Pharmacist will follow up with patient by telephone on 12/17/2023 at 9:00 AM       Sharyle Sia, PharmD, JAQUELINE, CPP Clinical Pharmacist [facility] [phone]

## 2023-10-30 DIAGNOSIS — Z87891 Personal history of nicotine dependence: Secondary | ICD-10-CM | POA: Diagnosis not present

## 2023-10-30 DIAGNOSIS — J3489 Other specified disorders of nose and nasal sinuses: Secondary | ICD-10-CM | POA: Diagnosis not present

## 2023-10-30 DIAGNOSIS — J324 Chronic pansinusitis: Secondary | ICD-10-CM | POA: Diagnosis not present

## 2023-10-30 DIAGNOSIS — R0982 Postnasal drip: Secondary | ICD-10-CM | POA: Diagnosis not present

## 2023-10-30 DIAGNOSIS — Z7901 Long term (current) use of anticoagulants: Secondary | ICD-10-CM | POA: Diagnosis not present

## 2023-10-30 DIAGNOSIS — J342 Deviated nasal septum: Secondary | ICD-10-CM | POA: Diagnosis not present

## 2023-10-30 DIAGNOSIS — J343 Hypertrophy of nasal turbinates: Secondary | ICD-10-CM | POA: Diagnosis not present

## 2023-10-30 DIAGNOSIS — J329 Chronic sinusitis, unspecified: Secondary | ICD-10-CM | POA: Diagnosis not present

## 2023-11-06 DIAGNOSIS — J329 Chronic sinusitis, unspecified: Secondary | ICD-10-CM | POA: Diagnosis not present

## 2023-11-06 DIAGNOSIS — J3489 Other specified disorders of nose and nasal sinuses: Secondary | ICD-10-CM | POA: Diagnosis not present

## 2023-11-06 DIAGNOSIS — J320 Chronic maxillary sinusitis: Secondary | ICD-10-CM | POA: Diagnosis not present

## 2023-11-14 ENCOUNTER — Other Ambulatory Visit: Payer: Self-pay | Admitting: Cardiovascular Disease

## 2023-11-14 ENCOUNTER — Other Ambulatory Visit: Payer: Self-pay

## 2023-11-14 MED ORDER — ATORVASTATIN CALCIUM 40 MG PO TABS: 40.0000 mg | ORAL_TABLET | Freq: Every day | ORAL | 2 refills | Status: AC

## 2023-11-26 ENCOUNTER — Other Ambulatory Visit: Payer: Self-pay | Admitting: Family Medicine

## 2023-11-26 DIAGNOSIS — J432 Centrilobular emphysema: Secondary | ICD-10-CM

## 2023-11-26 NOTE — Telephone Encounter (Unsigned)
 Copied from CRM [id number]. Topic: Clinical - Medication Refill >> Nov 26, 2023  1:48 PM Everette C wrote: Medication: Albuterol  Sulfate 108 (90 Base) MCG/ACT AEPB [[id number]]  Has the patient contacted their pharmacy? Yes (Agent: If no, request that the patient contact the pharmacy for the refill. If patient does not wish to contact the pharmacy document the reason why and proceed with request.) (Agent: If yes, when and what did the pharmacy advise?)  This is the patient's preferred pharmacy:  [facility] [address]), [place] - [address] [place]) [place] [address] Phone: [phone] Fax: [phone]  Is this the correct pharmacy for this prescription? Yes If no, delete pharmacy and type the correct one.   Has the prescription been filled recently? Yes  Is the patient out of the medication? Yes  Has the patient been seen for an appointment in the last year OR does the patient have an upcoming appointment? Yes  Can we respond through MyChart? No  Agent: Please be advised that Rx refills may take up to 3 business days. We ask that you follow-up with your pharmacy.

## 2023-11-27 ENCOUNTER — Other Ambulatory Visit: Payer: Self-pay | Admitting: Family Medicine

## 2023-11-27 DIAGNOSIS — E1142 Type 2 diabetes mellitus with diabetic polyneuropathy: Secondary | ICD-10-CM

## 2023-11-27 MED ORDER — ALBUTEROL SULFATE HFA 108 (90 BASE) MCG/ACT IN AERS
2.0000 | INHALATION_SPRAY | RESPIRATORY_TRACT | 2 refills | Status: AC | PRN
Start: 2023-11-27 — End: ?

## 2023-11-27 MED ORDER — ACCU-CHEK GUIDE TEST VI STRP: ORAL_STRIP | 3 refills | Status: AC

## 2023-11-27 MED ORDER — ACCU-CHEK GUIDE ME W/DEVICE KIT: PACK | 0 refills | Status: AC

## 2023-11-27 MED ORDER — ACCU-CHEK SOFTCLIX LANCETS MISC: 3 refills | Status: AC

## 2023-11-27 NOTE — Telephone Encounter (Signed)
 Requested medication (s) are due for refill today: yes  Requested medication (s) are on the active medication list: yes  Last refill:  05/15/16  Future visit scheduled: yes  Notes to clinic:  Unable to refill per protocol, last refill by another provider.      Requested Prescriptions  Pending Prescriptions Disp Refills   Albuterol  Sulfate (PROAIR  RESPICLICK) 108 (90 Base) MCG/ACT AEPB      Sig: Inhale 2 puffs into the lungs every 6 (six) hours as needed (shortness of breath).     Pulmonology:  Beta Agonists 2 Passed - 11/27/2023  3:46 PM      Passed - Last BP in normal range    BP Readings from Last 1 Encounters:  08/06/23 100/70         Passed - Last Heart Rate in normal range    Pulse Readings from Last 1 Encounters:  08/06/23 89         Passed - Valid encounter within last 12 months    Recent Outpatient Visits           4 months ago Acute non-recurrent frontal sinusitis   [facility] [place], Marsa PARAS, DO   5 months ago Left wrist pain   [place] [facility] [place], Angeline ORN, NP       Future Appointments             In 2 months Furth, [name], [name] [employer] [facility] at [place]

## 2023-12-05 ENCOUNTER — Other Ambulatory Visit: Payer: Self-pay | Admitting: Cardiovascular Disease

## 2023-12-14 ENCOUNTER — Other Ambulatory Visit: Payer: Self-pay | Admitting: Family Medicine

## 2023-12-14 DIAGNOSIS — E1142 Type 2 diabetes mellitus with diabetic polyneuropathy: Secondary | ICD-10-CM

## 2023-12-14 NOTE — Telephone Encounter (Unsigned)
 Copied from CRM #[id number]. Topic: Clinical - Medication Refill >> Dec 14, 2023  2:26 PM Charlet HERO wrote: Medication: ACCU-CHEK GUIDE TEST test strip Accu-Chek Softclix Lancets lancets Has the patient contacted their pharmacy? Yes Yes said no script for them  This is the patient's preferred pharmacy:  [facility] [address]),  - [address] [place]) [place] [address] Phone: [phone] Fax: [phone]  Is this the correct pharmacy for this prescription? Yes If no, delete pharmacy and type the correct one.   Has the prescription been filled recently? No  Is the patient out of the medication? Yes  Has the patient been seen for an appointment in the last year OR does the patient have an upcoming appointment? Yes  Can we respond through MyChart? Yes  Agent: Please be advised that Rx refills may take up to 3 business days. We ask that you follow-up with your pharmacy.

## 2023-12-17 ENCOUNTER — Other Ambulatory Visit: Admitting: Pharmacist

## 2023-12-17 DIAGNOSIS — E1142 Type 2 diabetes mellitus with diabetic polyneuropathy: Secondary | ICD-10-CM

## 2023-12-17 DIAGNOSIS — E119 Type 2 diabetes mellitus without complications: Secondary | ICD-10-CM

## 2023-12-17 DIAGNOSIS — Z7984 Long term (current) use of oral hypoglycemic drugs: Secondary | ICD-10-CM

## 2023-12-17 DIAGNOSIS — I129 Hypertensive chronic kidney disease with stage 1 through stage 4 chronic kidney disease, or unspecified chronic kidney disease: Secondary | ICD-10-CM

## 2023-12-17 DIAGNOSIS — I5022 Chronic systolic (congestive) heart failure: Secondary | ICD-10-CM

## 2023-12-17 DIAGNOSIS — J432 Centrilobular emphysema: Secondary | ICD-10-CM

## 2023-12-17 NOTE — Progress Notes (Signed)
 12/17/2023 Name: Philip NAILL Sr. MRN: [id number] DOB: Nov 03, 1945  Chief Complaint  Patient presents with   Medication Management    Philip Cuda. is a 78 y.o. year old male who presented for a telephone visit.   They were referred to the pharmacist by their PCP for assistance in managing diabetes, hypertension, hyperlipidemia, and medication access.    Subjective:   Care Team: Primary Care Provider: Edman Marsa PARAS, DO Cardiologist: Darron Deatrice LABOR, MD; Next Scheduled Visit: 02/01/2024 Pulmonologist: Theotis Lavelle BRAVO, MD; Next Scheduled Visit: 01/25/2024 Orthopedics: Mardee Lynwood Lucy, MD  Medication Access/Adherence  Current Pharmacy:  [facility] [address]), [place] - [address] [place]) [place] [address] Phone: [phone] Fax: [phone]   Patient reports affordability concerns with their medications: No  Patient reports access/transportation concerns to their pharmacy: No  Patient reports adherence concerns with their medications:  No     Patient uses weekly pillbox    Reports scheduled for sinus surgery on 02/15/2024. Planning to contact Cardiologist office today regarding clearance for upcoming procedure   Type 2 Diabetes:   Current medications: metformin  1,000 mg twice daily  Farxiga  10 mg - 1 tablet QAM     Current glucose readings:   Fasting Blood Glucose After Supper/Bedtime  12 - August 158 284*  13 - August 149 173  14 - August 132 160  15 - August 151 173  16 - August 136 166  17 - August 134 133  18 - August 134     * Attributes elevated readings to carbohydrate portion sizes at supper time and snacking choices in evenings (bananas, grapes, apples)   Reports using blood sugar readings as feedback on dietary choices   Denies symptoms of hypoglycemia   Current medication access support: enrolled in patient assistance for Farxiga  from [employer] through 04/30/2024     CHF/Hypertension/ Atrial Fibrillation:   Current medications:  Metoprolol  75 mg twice daily Spironolactone  25 mg - 1/2 tablet (12.5 mg) daily  Farxiga  10 mg - 1 tablets daily Furosemide  20 mg daily   Anticoagulation: Eliquis  5 mg twice daily     Patient has an automated, upper arm home BP cuff Current blood pressure readings readings:  - Last checked this morning: 103/72, HR 72   Denies symptoms of hypotension such as lightheadedness or dizziness    Confirms monitoring home weight daily as directed by Cardiology; reports home weight stable ~179 lbs    Current medication access support: enrolled in patient assistance for Farxiga  from [employer] through 04/30/2024    COPD/seasonal allergies: Patient follows with Dr. Theotis at [facility]   Current medications: Breztri  2 puffs twice daily Montelukast  10 mg QHS Albuterol  inhaler as needed Cetirizine  10 mg daily Flonase  nasal spray as needed     Confirms using Breztri  inhaler consistently as directed and rinsing mouth out after each use  Avoiding being outdoors in higher humidity to aid with breathing   Current medication access support: Enrolled in Breztri  patient assistance program through [employer] through 04/30/2024  Objective:  Lab Results  Component Value Date   HGBA1C 8.0 (H) 08/07/2023    Lab Results  Component Value Date   CREATININE 1.16 08/07/2023   BUN 21 08/07/2023   NA 139 08/07/2023   K 4.9 08/07/2023   CL 101 08/07/2023   CO2 29 08/07/2023    Lab Results  Component Value Date   CHOL 113 08/07/2023   HDL 48  08/07/2023   LDLCALC 47 08/07/2023   TRIG 101 08/07/2023   CHOLHDL 2.4 08/07/2023   BP Readings from Last 3 Encounters:  08/06/23 100/70  07/23/23 122/84  06/18/23 124/70   Pulse Readings from Last 3 Encounters:  08/06/23 89  07/23/23 85  05/29/23 88     Medications Reviewed Today     Reviewed by Philip [name], RPH-CPP (Pharmacist) on 12/17/23 at [phone]  Med  List Status: <None>   Medication Order Taking? Sig Documenting Provider Last Dose Status Informant  ACCU-CHEK GUIDE TEST test strip [id number]  Use to check blood sugar up to 2 x daily Edman Marsa PARAS, DO  Active   Accu-Chek Softclix Lancets lancets [id number]  Use to check blood sugar up to 2 x daily Edman Marsa PARAS, DO  Active   acetaminophen  (TYLENOL ) 500 MG tablet [id number]  Take 1,000 mg by mouth every 8 (eight) hours as needed for moderate pain or mild pain. [provider]  Active Self  albuterol  (VENTOLIN  HFA) 108 (90 Base) MCG/ACT inhaler [id number]  Inhale 2 puffs into the lungs every 4 (four) hours as needed for wheezing or shortness of breath. Edman Marsa PARAS, DO  Active   apixaban  (ELIQUIS ) 5 MG TABS tablet [id number] Yes Take 1 tablet by mouth twice daily Darron Deatrice LABOR, MD  Active   ARTIFICIAL TEAR SOLUTION OP [id number]  Place 1 drop into both eyes daily. [provider]  Active Self  atorvastatin  (LIPITOR) 40 MG tablet [id number]  Take 1 tablet (40 mg total) by mouth daily. Darron Deatrice LABOR, MD  Active   Blood Glucose Monitoring Suppl (ACCU-CHEK GUIDE ME) w/Device KIT [id number]  Use to check blood sugar up to 2 x daily Edman Marsa PARAS, DO  Active   Blood Glucose Monitoring Suppl (ONE TOUCH ULTRA 2) w/Device KIT [id number]  Use to check blood sugar as advised up to twice a day Edman Marsa PARAS, DO  Active Self  BREZTRI  AEROSPHERE 160-9-4.8 MCG/ACT TERESE [id number] Yes Inhale 2 puffs into the lungs 2 (two) times daily. [name], Marsa PARAS, DO  Active   calcium  carbonate (TUMS - DOSED IN MG ELEMENTAL CALCIUM ) 500 MG chewable tablet [id number]  Chew 1,000 mg by mouth daily as needed for indigestion or heartburn. [provider]  Active Self  cetirizine  (ZYRTEC ) 10 MG tablet [id number]  Take 10 mg by mouth daily. [provider]  Active Self  cyanocobalamin (VITAMIN B12) 1000 MCG tablet [id number]  Take 1,000 mcg  by mouth daily. [provider]  Active   FARXIGA  10 MG TABS tablet [id number] Yes Take 1 tablet (10 mg total) by mouth daily before breakfast. Edman Marsa PARAS, DO  Active   fluticasone  (FLONASE ) 50 MCG/ACT nasal spray [id number]  Place 2 sprays into both nostrils daily. Edman Marsa PARAS, DO  Active   furosemide  (LASIX ) 20 MG tablet [id number] Yes Take 1 tablet (20 mg total) by mouth daily. Franchester, [name], [name]  Active   gabapentin  (NEURONTIN ) 100 MG capsule [id number]  TAKE 1 CAPSULE BY MOUTH THREE TIMES DAILY [name], Marsa PARAS, DO  Active   ipratropium-albuterol  (DUONEB) 0.5-2.5 (3) MG/3ML SOLN [id number]  Take 3 mLs by nebulization every 6 (six) hours as needed (shortness of breath). [provider]  Active Self           Med Note JERALYN DUNCANS A   Fri Dec 24, 2020  4:04 PM)    levothyroxine  (SYNTHROID ) 88 MCG tablet [id number]  TAKE 1 TABLET BY MOUTH DAILY  BEFORE BREAKFAST Edman Marsa PARAS, DO  Active   metFORMIN  (GLUCOPHAGE ) 1000 MG tablet [id number] Yes Take 1 tablet (1,000 mg total) by mouth 2 (two) times daily with a meal. Edman, Marsa PARAS, DO  Active   metoprolol  tartrate (LOPRESSOR ) 50 MG tablet [id number] Yes TAKE 1 & 1/2 (ONE & ONE-HALF) TABLETS BY MOUTH TWICE DAILY Darron Deatrice LABOR, MD  Active   montelukast  (SINGULAIR ) 10 MG tablet [id number]  Take 1 tablet by mouth at bedtime. [provider]  Active   spironolactone  (ALDACTONE ) 25 MG tablet [id number] Yes Take 1/2 (one-half) tablet by mouth once daily Darron Deatrice LABOR, MD  Active               Assessment/Plan:   Again encourage patient to contact office to reschedule missed appointment with PCP     Diabetes: - Counseled on importance of continuing to control carbohydrate portion sizes and using blood sugar checks to reinforce dietary choices Encourage patient to limit sweets and sugary beverages (sweet tea, Pepsi) Discuss balanced snack ideas - Recommend to  continue to check glucose, keep log of results, to bring this record to medical appointments and use readings as feedback on dietary choices - Patient to follow up with [employer] assistance program as needed regarding refills of Farxiga    CHF/Hypertension/Atrial Fibrillation: - Encourage patient to continue to weigh daily - Recommended to continue to check home blood pressure and heart rate, keep log of results and contact Cardiologist for BP and HR if needed for readings outside of established parameters or symptoms   COPD/Seasonal Allergies: - Reviewed appropriate inhaler technique - Patient to follow up with [employer] assistance program as needed regarding refills of Breztri  inhaler    Follow Up Plan: Clinical Pharmacist will follow up with patient by telephone on 02/11/2024 at 9:30 AM      Sharyle Sia, PharmD, JAQUELINE, CPP Clinical Pharmacist [facility] [phone]

## 2023-12-17 NOTE — Patient Instructions (Signed)
Goals Addressed             This Visit's Progress    Pharmacy Goals       Our goal A1c is less than 7%. This corresponds with fasting sugars less than 130 and 2 hour after meal sugars less than 180. Please check your blood sugar and keep log of results  Please continue to monitor your home weight, blood pressure and heart rate  Our goal bad cholesterol, or LDL, is less than 70 . This is why it is important to continue taking your atorvastatin  Feel free to call me with any questions or concerns. I look forward to our next call!    [name], PharmD, [facility] Clinical Pharmacist [facility] [phone]

## 2023-12-18 NOTE — Telephone Encounter (Signed)
 Call to pharmacy- Rx on file- and will fill for patient- patient notified. No need to resend Requested Prescriptions  Pending Prescriptions Disp Refills   ACCU-CHEK GUIDE TEST test strip 200 each 3    Sig: Use to check blood sugar up to 2 x daily     There is no refill protocol information for this order     Accu-Chek Softclix Lancets lancets 200 each 3    Sig: Use to check blood sugar up to 2 x daily     Endocrinology: Diabetes - Testing Supplies Passed - 12/18/2023 10:47 AM      Passed - Valid encounter within last 12 months    Recent Outpatient Visits           4 months ago Acute non-recurrent frontal sinusitis   [facility] Edman Marsa PARAS, DO   6 months ago Left wrist pain   [place] [facility] [place], Angeline ORN, NP       Future Appointments             In 1 month Furth, [name], [name]  [facility] [place]

## 2024-01-12 ENCOUNTER — Other Ambulatory Visit: Payer: Self-pay | Admitting: Family Medicine

## 2024-01-12 DIAGNOSIS — E1142 Type 2 diabetes mellitus with diabetic polyneuropathy: Secondary | ICD-10-CM

## 2024-01-15 NOTE — Telephone Encounter (Signed)
 Requested Prescriptions  Pending Prescriptions Disp Refills   metFORMIN  (GLUCOPHAGE ) 1000 MG tablet [Pharmacy Med Name: metFORMIN  HCl 1000 MG Oral Tablet] 180 tablet 0    Sig: TAKE 1 TABLET BY MOUTH TWICE DAILY WITH MEAL     Endocrinology:  Diabetes - Biguanides Failed - 01/15/2024  8:15 AM      Failed - HBA1C is between 0 and 7.9 and within 180 days    Hemoglobin A1C  Date Value Ref Range Status  06/01/2020 8.7  Final    Comment:    [employer] Nurse   Hgb A1c MFr Bld  Date Value Ref Range Status  08/07/2023 8.0 (H) <5.7 % of total Hgb Final    Comment:    For someone without known diabetes, a hemoglobin A1c value of 6.5% or greater indicates that they may have  diabetes and this should be confirmed with a follow-up  test. . For someone with known diabetes, a value <7% indicates  that their diabetes is well controlled and a value  greater than or equal to 7% indicates suboptimal  control. A1c targets should be individualized based on  duration of diabetes, age, comorbid conditions, and  other considerations. . Currently, no consensus exists regarding use of hemoglobin A1c for diagnosis of diabetes for children. .          Failed - B12 Level in normal range and within 720 days    Vitamin B-12  Date Value Ref Range Status  07/27/2021 253 200 - 1,100 pg/mL Final    Comment:    . Please Note: Although the reference range for vitamin B12 is [phone] pg/mL, it has been reported that between 5 and 10% of patients with values between 200 and 400 pg/mL may experience neuropsychiatric and hematologic abnormalities due to occult B12 deficiency; less than 1% of patients with values above 400 pg/mL will have symptoms. .          Passed - Cr in normal range and within 360 days    Creat  Date Value Ref Range Status  08/07/2023 1.16 0.70 - 1.28 mg/dL Final   Creatinine, Urine  Date Value Ref Range Status  08/07/2023 73 20 - 320 mg/dL Final         Passed - eGFR in normal range  and within 360 days    GFR, Est African American  Date Value Ref Range Status  06/24/2018 85 > OR = 60 mL/min/1.48m2 Final   GFR calc Af Amer  Date Value Ref Range Status  05/31/2020 67 >59 mL/min/1.73 Final    Comment:    **In accordance with recommendations from the NKF-ASN Task force,**   [facility] is in the process of updating its eGFR calculation to the   2021 CKD-EPI creatinine equation that estimates kidney function   without a race variable.    GFR, Est Non African American  Date Value Ref Range Status  06/24/2018 74 > OR = 60 mL/min/1.3m2 Final   GFR, Estimated  Date Value Ref Range Status  03/03/2022 >60 >60 mL/min Final    Comment:    (NOTE) Calculated using the CKD-EPI Creatinine Equation (2021)    eGFR  Date Value Ref Range Status  08/07/2023 65 > OR = 60 mL/min/1.1m2 Final  05/29/2023 62 >59 mL/min/1.73 Final         Passed - Valid encounter within last 6 months    Recent Outpatient Visits           5 months ago Acute non-recurrent frontal  sinusitis   [place] [facility] [place], Marsa PARAS, DO   7 months ago Left wrist pain   [place] [facility] [place], Angeline ORN, NP       Future Appointments             In 2 weeks Franchester, [name], [name] [place] [facility] - CBC within normal limits and completed in the last 12 months    WBC  Date Value Ref Range Status  08/07/2023 8.7 3.8 - 10.8 Thousand/uL Final   RBC  Date Value Ref Range Status  08/07/2023 4.97 4.20 - 5.80 Million/uL Final   Hemoglobin  Date Value Ref Range Status  08/07/2023 14.8 13.2 - 17.1 g/dL Final  98/71/7974 84.2 13.0 - 17.7 g/dL Final   HCT  Date Value Ref Range Status  08/07/2023 44.0 38.5 - 50.0 % Final   Hematocrit  Date Value Ref Range Status  05/29/2023 46.9 37.5 - 51.0 % Final   MCHC  Date Value Ref Range Status  08/07/2023 33.6 32.0 - 36.0 g/dL Final    Comment:    For adults, a  slight decrease in the calculated MCHC value (in the range of 30 to 32 g/dL) is most likely not clinically significant; however, it should be interpreted with caution in correlation with other red cell parameters and the patient's clinical condition.    [facility]  Date Value Ref Range Status  08/07/2023 29.8 27.0 - 33.0 pg Final   MCV  Date Value Ref Range Status  08/07/2023 88.5 80.0 - 100.0 fL Final  05/29/2023 88 79 - 97 fL Final   No results found for: PLTCOUNTKUC, LABPLAT, POCPLA RDW  Date Value Ref Range Status  08/07/2023 14.1 11.0 - 15.0 % Final  05/29/2023 14.3 11.6 - 15.4 % Final

## 2024-01-16 ENCOUNTER — Other Ambulatory Visit: Payer: Self-pay | Admitting: Family Medicine

## 2024-01-16 DIAGNOSIS — E1142 Type 2 diabetes mellitus with diabetic polyneuropathy: Secondary | ICD-10-CM

## 2024-01-17 NOTE — Telephone Encounter (Signed)
 Requested Prescriptions  Pending Prescriptions Disp Refills   gabapentin  (NEURONTIN ) 100 MG capsule [Pharmacy Med Name: Gabapentin  100 MG Oral Capsule] 270 capsule 0    Sig: TAKE 1 CAPSULE BY MOUTH THREE TIMES DAILY     Neurology: Anticonvulsants - gabapentin  Passed - 01/17/2024  1:40 PM      Passed - Cr in normal range and within 360 days    Creat  Date Value Ref Range Status  08/07/2023 1.16 0.70 - 1.28 mg/dL Final   Creatinine, Urine  Date Value Ref Range Status  08/07/2023 73 20 - 320 mg/dL Final         Passed - Completed PHQ-2 or PHQ-9 in the last 360 days      Passed - Valid encounter within last 12 months    Recent Outpatient Visits           5 months ago Acute non-recurrent frontal sinusitis   [place] [facility] Edman Marsa PARAS, DO   7 months ago Left wrist pain   [place] [place], Angeline ORN, NP       Future Appointments             In 2 weeks Franchester, [name], [name] [place] [facility] [place]

## 2024-01-21 DIAGNOSIS — I2510 Atherosclerotic heart disease of native coronary artery without angina pectoris: Secondary | ICD-10-CM | POA: Diagnosis not present

## 2024-01-21 DIAGNOSIS — I4891 Unspecified atrial fibrillation: Secondary | ICD-10-CM | POA: Diagnosis not present

## 2024-01-21 DIAGNOSIS — I5032 Chronic diastolic (congestive) heart failure: Secondary | ICD-10-CM | POA: Diagnosis not present

## 2024-01-21 DIAGNOSIS — E119 Type 2 diabetes mellitus without complications: Secondary | ICD-10-CM | POA: Diagnosis not present

## 2024-01-21 DIAGNOSIS — Z789 Other specified health status: Secondary | ICD-10-CM | POA: Diagnosis not present

## 2024-01-21 DIAGNOSIS — R040 Epistaxis: Secondary | ICD-10-CM | POA: Diagnosis not present

## 2024-01-23 ENCOUNTER — Other Ambulatory Visit: Payer: Self-pay | Admitting: Family Medicine

## 2024-01-23 DIAGNOSIS — E1142 Type 2 diabetes mellitus with diabetic polyneuropathy: Secondary | ICD-10-CM

## 2024-01-24 NOTE — Telephone Encounter (Signed)
 Requested Prescriptions  Refused Prescriptions Disp Refills   gabapentin  (NEURONTIN ) 100 MG capsule [Pharmacy Med Name: Gabapentin  100 MG Oral Capsule] 270 capsule 0    Sig: TAKE 1 CAPSULE BY MOUTH THREE TIMES DAILY     Neurology: Anticonvulsants - gabapentin  Passed - 01/24/2024  3:41 PM      Passed - Cr in normal range and within 360 days    Creat  Date Value Ref Range Status  08/07/2023 1.16 0.70 - 1.28 mg/dL Final   Creatinine, Urine  Date Value Ref Range Status  08/07/2023 73 20 - 320 mg/dL Final         Passed - Completed PHQ-2 or PHQ-9 in the last 360 days      Passed - Valid encounter within last 12 months    Recent Outpatient Visits           6 months ago Acute non-recurrent frontal sinusitis   [place] [facility] Edman Marsa PARAS, DO   7 months ago Left wrist pain   [place] [facility] [place], Angeline ORN, NP       Future Appointments             In 1 week Franchester, [name], [name]  [facility] [place]

## 2024-01-25 DIAGNOSIS — Z01818 Encounter for other preprocedural examination: Secondary | ICD-10-CM | POA: Diagnosis not present

## 2024-01-25 DIAGNOSIS — J449 Chronic obstructive pulmonary disease, unspecified: Secondary | ICD-10-CM | POA: Diagnosis not present

## 2024-01-25 DIAGNOSIS — J301 Allergic rhinitis due to pollen: Secondary | ICD-10-CM | POA: Diagnosis not present

## 2024-01-25 DIAGNOSIS — R040 Epistaxis: Secondary | ICD-10-CM | POA: Diagnosis not present

## 2024-01-25 NOTE — Progress Notes (Signed)
 No chief complaint on file.   History of Present Illness: Philip [name] is a 78 y.o. male hyperglycemia, pcp following who presents to clinic for recheck. Moderate  copd, wheezing earlier, went away after the albuterol . For nasal surgery for nasal bleeding. No sinus congestion. No headaches. No chest pain. Edema or leg pain.  No worsening dyspnea. Appetite is great. No fever, chills or no blleding. Pulm wise ok for the procedure.    Current Medications:  Current Outpatient Medications  Medication Sig Dispense Refill  . acetaminophen  (TYLENOL ) 500 MG tablet Take 1,000 mg by mouth every 8 (eight) hours as needed    . albuterol  MDI, PROVENTIL , VENTOLIN , PROAIR , HFA 90 mcg/actuation inhaler INHALE 2 PUFFS BY MOUTH EVERY 6 HOURS AS NEEDED FOR WHEEZING 18 g 4  . apixaban  (ELIQUIS ) 5 mg tablet TAKE ONE TABLET BY MOUTH TWICE DAILY    . artificial tears with lanolin (AKWA TEARS) ophthalmic ointment Place 2 drops into both eyes as needed    . atorvastatin  (LIPITOR) 40 MG tablet Take 40 mg by mouth once daily    . budesonide -glycopyrrolate -formoterol  (BREZTRI  AEROSPHERE) 160-9-4.8 mcg/actuation inhaler Inhale 2 inhalations into the lungs 2 (two) times daily    . cetirizine  (ZYRTEC ) 10 MG tablet Take 10 mg by mouth once daily    . ESOMEPRAZOLE MAGNESIUM  (NEXIUM PACKET ORAL) Take by mouth once daily as needed.    . FARXIGA  10 mg tablet Take 10 mg by mouth every morning before breakfast    . fluticasone  propionate (FLONASE ) 50 mcg/actuation nasal spray Place 1 spray into both nostrils once daily as needed    . FUROsemide  (LASIX ) 40 MG tablet Take 20 mg by mouth once daily    . gabapentin  (NEURONTIN ) 100 MG capsule TAKE 1 CAPSULE BY MOUTH THREE TIMES DAILY 90 capsule 5  . guaiFENesin  (MUCINEX ) 600 mg SR tablet Take 600 mg by mouth 2 (two) times daily as needed    . ipratropium-albuteroL  (DUO-NEB) nebulizer solution Take 3 mLs by nebulization 4 (four) times daily 360 mL 5  . levothyroxine  (SYNTHROID ,  LEVOTHROID) 88 MCG tablet Take 88 mcg by mouth once daily       . losartan  (COZAAR ) 25 MG tablet Take 25 mg by mouth once daily  3  . metFORMIN  (GLUCOPHAGE ) 500 MG tablet Take 500 mg by mouth 2 (two) times daily.    . methocarbamoL  (ROBAXIN ) 500 MG tablet Take 500 mg by mouth every 6 (six) hours as needed (Patient not taking: Reported on 01/21/2024)    . metoprolol  tartrate (LOPRESSOR ) 50 MG tablet Take 75 mg by mouth 2 (two) times daily    . montelukast  (SINGULAIR ) 10 mg tablet Take 1 tablet by mouth nightly 90 tablet 0  . ONETOUCH ULTRA BLUE TEST STRIP test strip USE TEST STRIP(S) TO CHECK GLUCOSE TWICE DAILY  12  . ONETOUCH ULTRA2 METER Misc USE TO CHECK BLOOD SUGAR UP TO TWICE DAILY AS INSTRUCTED    . spironolactone  (ALDACTONE ) 25 MG tablet Take 25 mg by mouth once daily    . SYMBICORT 160-4.5 mcg/actuation inhaler Inhale 2 puffs by mouth twice daily (Patient not taking: Reported on 01/21/2024) 10.2 g 6  . TRADJENTA  5 mg tablet Take 5 mg by mouth once daily (Patient not taking: Reported on 01/21/2024)     No current facility-administered medications for this visit.    Problem List:  Patient Active Problem List  Diagnosis  . Colon polyp  . Type 2 diabetes mellitus with peripheral neuropathy (CMS/HHS-HCC)  . Pulmonary  emphysema (CMS/HHS-HCC)  . (HFpEF) heart failure with preserved ejection fraction (CMS/HHS-HCC)  . Allergic rhinitis due to allergen  . Asthma with exacerbation (HHS-HCC)  . Atrial fibrillation (CMS/HHS-HCC)  . Coronary artery disease  . Herpes zoster  . Hypothyroidism  . Osteoarthritis of multiple joints  . Peripheral neuropathy  . S/P CABG x 3  . Cellulitis of right elbow  . Right elbow pain  . Left hip pain  . Radicular pain of lumbosacral region  . Benign hypertension with CKD (chronic kidney disease), stage II  . Chronic systolic CHF (congestive heart failure) (CMS/HHS-HCC)  . Community acquired pneumonia of left lower lobe of lung  . DDD (degenerative disc  disease), lumbar  . Hyperlipidemia associated with type 2 diabetes mellitus (CMS/HHS-HCC)  . MSSA bacteremia  . Overweight (BMI 25.0-29.9)  . Thrush  . Status post total right knee replacement  . Aortic atherosclerosis ()  . Moderate COPD (chronic obstructive pulmonary disease) (CMS/HHS-HCC)    History: Past Medical History:  Diagnosis Date  . (HFpEF) heart failure with preserved ejection fraction (CMS/HHS-HCC) 07/25/2015   Last Assessment & Plan:  Stable, without worsening. Euvolemic Followed by [facility]  . Allergic rhinitis due to allergen 07/31/2016   Last Assessment & Plan:  Likely trigger for current COPD flare. Continue anti-histamine Likely need flonase  or nasal steroid in future  . Atrial fibrillation (CMS/HHS-HCC) 12/10/2014   Last Assessment & Plan:  Stable, without worsening or complication. Currently sinus without AFib, occasional PAC On chronic anticoagulation Eliquis  Followed by [facility] Cardiology, also Dr Kelsie EP - last seen 03/2017, taken off Amio, not indicated for ablation at this time  . Blockage of coronary artery of heart (CMS/HHS-HCC)   . Chickenpox   . Collapsed lung   . Colon polyp 12-21-2003   adenomatous  . COPD (chronic obstructive pulmonary disease) (CMS/HHS-HCC)   . Coronary artery disease 12/10/2017   Last Assessment & Plan:  Stable without angina S/p CABG 2014 Followed by [facility] On med management now Keep all follow up with Cardiologist as requested.  . DM (diabetes mellitus) (CMS/HHS-HCC) 10/22/2013  . H. pylori infection   . Heart attack (CMS/HHS-HCC)   . Herpes zoster 12/10/2017  . High cholesterol   . Hypothyroidism 05/06/2012   Last Assessment & Plan:  Stable, controlled on current dose Levothyroxine  TSH minimally elevated, improved from prior. Normal Free T4 Refilled  . Multiple allergies   . OSA on CPAP 02/28/2017   Last Assessment & Plan:  Well controlled, chronic OSA on CPAP - Good adherence to CPAP nightly - Continue current CPAP  therapy, patient seems to be benefiting from therapy  . Osteoarthritis of multiple joints 05/14/2017   Last Assessment & Plan:  Gradual worsening R knee pain and limitations, with known multiple joint OA/DJD, mostly knees S/p R knee surgery Dr Mardee [facility] 2003 Reviewed conservative therapy again Consider repeat X-ray but deferred since may refer to Ortho soon at request, advised return to Dr Mardee for re-eval. Consider steroid or other injections  . Peripheral neuropathy 08/17/2015  . Polyp of colon, adenomatous 10/22/2013  . Pulmonary emphysema (CMS/HHS-HCC) 12/27/2015  . S/P CABG x 3 05/12/2012   Last Assessment & Plan:  He is doing reasonably well. He has much more energy than before and is able to do more activities without significant limitations. Continue medical therapy.  . Sleep apnea   . Thyroid  disease     Past Surgical History:  Procedure Laterality Date  . COLONOSCOPY  12/21/2003  Adenomatous Polyps, FH Colon Polyps (Mother/Sister)  . EGD  12/21/2003   H Pylori: No repeat per RTE  . CORONARY ARTERY BYPASS W/ARTERIAL GRAFTS  2014  . COLONOSCOPY  11/21/2013   Adenomatous Polyps, FH Colon Polyps (Mother/Sister:  CBF 10/2018 Recall ltr mailed   . Right L3-4 microdiscectomy  01/07/2021   Dr Reeves Daisy [facility]  . Right total knee arthroplasty using computer-assisted navigation  03/07/2021   Dr Mardee  . KNEE ARTHROSCOPY Right   . neck surgery      Family History  Problem Relation Name Age of Onset  . High blood pressure (Hypertension) Mother    . Breast cancer Mother    . High blood pressure (Hypertension) Father    . Prostate cancer Father      Social History   Socioeconomic History  . Marital status: Married    Spouse name: Ronal Mon  . Number of children: 4  . Years of education: 13.5  Occupational History  . Occupation: RetiredFacilities manager up and [employer]  Tobacco Use  . Smoking status: Former    Current packs/day: 0.00    Average packs/day: 1.5  packs/day for 5.0 years (7.5 ttl pk-yrs)    Types: Cigarettes    Start date: 05/01/1962    Quit date: 05/02/1967    Years since quitting: 56.7  . Smokeless tobacco: Never  Vaping Use  . Vaping status: Never Used  Substance and Sexual Activity  . Alcohol  use: Not Currently  . Drug use: Never  . Sexual activity: Yes    Partners: Female   Social Drivers of Corporate investment banker Strain: Low Risk  (09/07/2023)   Received from [facility]   Overall Financial Resource Strain (CARDIA)   . Difficulty of Paying Living Expenses: Not very hard  Food Insecurity: No Food Insecurity (09/07/2023)   Received from [facility]   Hunger Vital Sign   . Within the past 12 months, you worried that your food would run out before you got the money to buy more.: Never true   . Within the past 12 months, the food you bought just didn't last and you didn't have money to get more.: Never true  Transportation Needs: No Transportation Needs (09/07/2023)   Received from [facility] - Transportation   . Lack of Transportation (Medical): No   . Lack of Transportation (Non-Medical): No    Allergies:  Sulfa (sulfonamide antibiotics), Tiotropium bromide , Penicillin g, Iodinated contrast media, Iodine , and Other  Review of Systems: As per above. Pretty much unchanged with the exception that his breathing is improved. No associated cardiopulmonary, GI, GU, dermatological symptoms today. No focal neurological symptoms or psychological changes.   Physical Exam: There were no vitals taken for this visit.     General:  NAD. Able to speak in complete sentences without cough or dyspnea HEENT: Normocephalic, nontraumatic. Extraocular movements intact NECK: Supple. No JVD, nodes, thryomegaly CV: RRR no murmurs, gallops, rubs PULM: Normal respiratory effort, Clear to auscultation bilaterally without wheezing or crackles EXTREMITIESs: No significant edema, cyanosis or Homans'signs SKIN: Fair turgor. No  rashes LYMPHATIC: No nodes NEURO: No gross deficits PSYCH: Appropriate affect   Diagnostics: SPIROMETRY: FVC was 2.74 L, 68 % of predicted FEV1 was 1.82 L, 61 % of predicted FEV1/FVC ratio was  88 % of predicted FEF 25-75% liters per second was 46 % of predicted  LUNG VOLUMES: TLC was 92 % of predicted RV was 127 % of predicted  DIFFUSION CAPACITY: DLCO was 74 % of predicted DLCO/VA was 93 % of predicted   Good patient effort with good repeatability. Patient states more congested than normal and having sinus surgery soon.   Interpretation: Fvc and fev1 is moderately decreased DLCO is mildly decreased RV is elevated. airtrapping  Interpreting Physician Dr. Theotis Fvc 3.1 (72 %), fev1 1.82 liters ( 59 %), fef 28%, exp flow volume loop is delayed. C/w mod copd.    FVC was 3.12 Liters, 78% of predicted FEV1 was 1.87 Liters, 60% of predicted FEV1 ratio was 59.96%, 76% of predicted FEF 25-75% Liters per second was 0.93, 30% of predicted   Impression: Moderate Copd, MM  phenotype,  a little tight initially better after the albuterol . stable Continue albuterol , singulair , breztri  2 puffs bid Return in 6 months,    Mild rhinitis, no gerd, breathing no worse. zyrtec , flonase  prn Nasal rinses

## 2024-01-28 DIAGNOSIS — J329 Chronic sinusitis, unspecified: Secondary | ICD-10-CM | POA: Diagnosis not present

## 2024-01-28 DIAGNOSIS — R7309 Other abnormal glucose: Secondary | ICD-10-CM | POA: Diagnosis not present

## 2024-01-28 DIAGNOSIS — E1142 Type 2 diabetes mellitus with diabetic polyneuropathy: Secondary | ICD-10-CM | POA: Diagnosis not present

## 2024-01-28 NOTE — Progress Notes (Signed)
 ENDOCRINOLOGY VISIT NOTE  Date of visit: 01/28/2024 9:16 AM  Philip [name]  05-02-1945  PCP -  Edman Marsa PARAS, DO   IMPRESSION:  Type 2 Diabetes Mellitus: A1c 8% not at goal   Diabetic kidney disease: Absent  Neuropathy: Absent Eye exam:  2025 Body mass index is 25.98 kg/m.  Dyslipidemia: on statin  PLAN:  Monitor blood glucose 1-2 times per day and when you feel you have low blood glucose.  Labs reviewed Follow up in 3 months Metformin  500 mg BID (unsure of dose) Farxiga  10 mg daily    Goal for the HbA1c should be around 7% without any low glucose readings. If you have questions about your diabetes medications please contact the office and speak to one of the diabetes educators / or ask nurse to page me.  Call  If blood sugars are below 70 or persistently above 300 mg/dl.    Philip [name] verbalized understanding about the current diagnosis and treatment plan  No follow-ups on file. Asked to test 2-3 times daily and send readings in a week. He will continue to work on diet and eliminating sugar drinks. Will get fructosamine level in 1-2 weeks. Follow up in 3 months.  HISTORY OF PRESENT ILLNESS/chief complaint.:  Philip [name] is a 78 y.o. male who presents for initial evaluation for Type 2 Diabetes Mellitus. He is scheduled for sinus surgery for chronic sinusitis in October and need BS optimization. He is on oral medications. He reports that his dose of Metformin  was recently changed but he is not sure if it was in an increase or decease. He is testing 2 times daily, fasting AM and 1-2 hours after dinner. He has eliminated soda and sweet tea from his diet. This has helped lower BS.   He reports that  he has an A1c done a week ago that was over 8 but he has no report and it is not in My Chart.   Diabetes mellitus Diagnosed: >10 years C-peptide: (if any) Antibodies: (if any) Meter: Did not bring  CGM: Average: TIR: Lows:  Current readings: AM readings 130-160, after  dinner around 180 Any assisted hypoglycemia: Current medications  Hypoglycemia instructions: see AVS Medications tried:    Pertinent positive ROS in bold. Change in appetite, blurred vision, chest pain, chills, fever, diarrhea/constipation,  nausea,vomiting, numbness/tingling in feet, polyuria/polydipsia,  foot sores, shortness of breath, weight changes. All systems were reviewed and are negative, except as noted in HPI   PHYSICAL EXAM:  BP 115/71 (BP Location: Right upper arm, Patient Position: Sitting, BP Cuff Size: Adult)   Pulse 74   Temp 36.7 C (98.1 F) (Oral)   Resp 18   Ht 178.4 cm (5' 10.24) Comment: shoes off  Wt 82.7 kg (182 lb 5.1 oz)   BMI 25.98 kg/m   Weights:  Wt Readings from Last 3 Encounters:  01/28/24 82.7 kg (182 lb 5.1 oz)  01/25/24 82.7 kg (182 lb 5.1 oz)  01/25/24 82.7 kg (182 lb 6.4 oz)     CONSTITUTIONAL: appears active and healthy with no apparent distress PSYCHIATRIC:  oriented to place and person; normal affect EYES: No evidence of lid lag / exophthalmos / ptosis NECK: Supple, without evidence of asymmetry or cervical lymph nodes THYROID : No evidence of goiter or nodules or bruit or tenderness HEART: Normal S1-S2, no murmurs LUNGS: Clear to auscultation and no abnormal respiratory effort ABDOMEN: Soft, non tender, no organomegaly or masses EXTREMETIES: No edema or varicose veins NEURO: no tremors of outstretched hands,  CN II-XII intact SKIN: no sores/ulcers. No areas of lipohypertrophy in stomach  complete foot exam:    Right Left  Deep tendon reflexes (ankle) P P   DP pulses P P  Sensation to 10 gram monofilament 10/10 10/10   P = Present, A = Absent, D = Decreased   I spent a total of 60 minutes in both face-to-face and non-face-to-face activities, excluding procedures performed, for this visit on the date of this encounter.    Attestation Statement:   I personally performed the service, non-incident to. (WP)   TANYA JEAN  MUNGER, NP

## 2024-01-28 NOTE — Progress Notes (Signed)
 Blood Sugar: 138 mg/dl  J8R: done a week ago, result: over 8 per pt  No results found for: HGBA1C, HBA1C  Last Meal: 0200 PM (01/27/2024)  Patient brought meter: no  Model of Meter: -  Pump no  Pump model: n/a  Last Diabetes Eye Exam: Last Diabetic Eye Exam Completed: not documented

## 2024-02-01 ENCOUNTER — Encounter: Payer: Self-pay | Admitting: Medical

## 2024-02-01 ENCOUNTER — Ambulatory Visit: Attending: Medical | Admitting: Medical

## 2024-02-01 VITALS — BP 100/50 | HR 93 | Ht 70.0 in | Wt 184.0 lb

## 2024-02-01 DIAGNOSIS — I4821 Permanent atrial fibrillation: Secondary | ICD-10-CM | POA: Diagnosis not present

## 2024-02-01 MED ORDER — METOPROLOL TARTRATE 50 MG PO TABS: 50.0000 mg | ORAL_TABLET | Freq: Two times a day (BID) | ORAL | 3 refills | Status: AC

## 2024-02-01 NOTE — Patient Instructions (Signed)
 Medication Instructions:  Your physician recommends the following medication changes.  DECREASE: Metoprolol  to 50 mg by mouth twice a day   *If you need a refill on your cardiac medications before your next appointment, please call your pharmacy*  Lab Work: No labs ordered today    Testing/Procedures: No test ordered today   Follow-Up: At [facility], you and your health needs are our priority.  As part of our continuing mission to provide you with exceptional heart care, our providers are all part of one team.  This team includes your primary Cardiologist (physician) and Advanced Practice Providers or APPs (Physician Assistants and Nurse Practitioners) who all work together to provide you with the care you need, when you need it.  Your next appointment:   6 month(s)  Provider:   Mikey Fishman, [name]

## 2024-02-01 NOTE — Progress Notes (Unsigned)
 Cardiology Office Note   Date:  02/01/2024  ID:  Philip BRANDON Sr., DOB [birth year], MRN [id number] PCP: Edman Marsa PARAS, DO  [facility] Providers Cardiologist:  Deatrice Cage, MD    History of Present Illness Philip [name]. is a 78 y.o. male  with a hx of CAD s/p CABG, chronic systolic heart failure, chronic afib, atrial tachycardia who presents for follow-up.    The patient underwent CABG in January 2014. He had post-op afib which was treated with amiodarone .    He was hospitalized in February 2021 with sepsis secondary to PNA and MSSA bacteremia complicated by atrial flutter with RVR. Echo showed LVEF 40-45% with global HK, mildly reduced RV function, moderate pulmonary HTN, mild to moderate MR. TEE showed an EF of 30-35% with no evidence of vegetations.    He had a Lexiscan  Myoview  in March of 2021 which showed no evidence of ischemia. Echo in October 2022 showed an EF 40-45% with normal LV function.    The patient was last seen 05/29/23 reporting SOB. He had started lasix  a week prior. Labs, CXR and echo were ordered. Echo showed LVEF 40-45%, global HK, mildly reduced RVSF, mild to mod MR.  The patient was last seen 08/06/23 and was overall stable from a cardiac perspective.   Today, the patient reports he is overall stable. He reports chronic SOB that he feels is actually improved. He also reports recurrent nosebleeds found to have recurrent sinusitis and left sided mass, needing removed. He denies chest pain or lower leg edema. He has shoulder and neck pain from coughing. BP is soft. He has occasional dizziness or lightheadedness. Says BP has been low at home 90/50s at times. METS>4  He has recurrent sinusitis and was found to have a mass on the left side with plan for surgery 02/15/24 with Dr. Danita Alken. They are requesting to hold Eliquis  48 hours. Will contact office for official request.   Prior surgery in 2022 he held Eliquis  3 days before.   Studies  Reviewed EKG Interpretation Date/Time:  Friday February 01 2024 15:36:01 EDT Ventricular Rate:  93 PR Interval:    QRS Duration:  76 QT Interval:  392 QTC Calculation: 487 R Axis:   0  Text Interpretation: Atrial fibrillation with premature ventricular or aberrantly conducted complexes Low voltage QRS Cannot rule out Anterior infarct (cited on or before 06-Aug-2023) When compared with ECG of 06-Aug-2023 15:31, Nonspecific T wave abnormality, worse in Inferior leads Confirmed by Franchester, Durene Dodge ([id number]) on 02/01/2024 3:52:19 PM     Echo 06/2023 1. Left ventricular ejection fraction, by estimation, is 40 to 45%. Left  ventricular ejection fraction by 3D volume is 40 %. Left ventricular  ejection fraction by 2D MOD biplane is 43.2 %. The left ventricle has  mildly decreased function. The left  ventricle demonstrates global hypokinesis. Left ventricular diastolic  parameters are indeterminate.   2. Right ventricular systolic function is mildly reduced. The right  ventricular size is normal. There is normal pulmonary artery systolic  pressure. The estimated right ventricular systolic pressure is 31.4 mmHg.   3. Right atrial size was mildly dilated.   4. The mitral valve is normal in structure. Mild to moderate mitral valve  regurgitation. No evidence of mitral stenosis.   5. The aortic valve is tricuspid. Aortic valve regurgitation is not  visualized. Aortic valve sclerosis is present, with no evidence of aortic  valve stenosis.   6. The inferior vena cava is normal in  size with greater than 50%  respiratory variability, suggesting right atrial pressure of 3 mmHg.    Echo limited 01/2021  1. Left ventricular ejection fraction, by estimation, is 40 to 45%. The  left ventricle has mildly decreased function. The left ventricle  demonstrates global hypokinesis. Left ventricular diastolic parameters are  indeterminate.   2. Right ventricular systolic function is normal. The right ventricular   size is normal.   3. The mitral valve is normal in structure. Trivial mitral valve  regurgitation. No evidence of mitral stenosis.   4. The aortic valve is normal in structure. Aortic valve regurgitation is  not visualized. No aortic stenosis is present.   5. The inferior vena cava is normal in size with greater than 50%  respiratory variability, suggesting right atrial pressure of 3 mmHg.    Echo 08/2019   1. Left ventricular ejection fraction, by estimation, is 40 to 45%. The  left ventricle has mildly decreased function. The left ventricle  demonstrates global hypokinesis. Left ventricular diastolic parameters are  indeterminate.   2. Right ventricular systolic function is normal. The right ventricular  size is normal. Tricuspid regurgitation signal is inadequate for assessing  PA pressure.   3. Left atrial size was moderately dilated.   4. The mitral valve is normal in structure. Moderate mitral valve  regurgitation. No evidence of mitral stenosis.   5. Tricuspid valve regurgitation is mild to moderate.    Myoview  Lexiscan  06/2019 Narrative & Impression  The study is normal. This is a low risk study. The left ventricular ejection fraction is normal (63%). There is no evidence for ischemia   Physical Exam VS:  BP (!) 100/50 (BP Location: Left Arm, Patient Position: Sitting, Cuff Size: Normal)   Pulse 93   Ht 5' 10 (1.778 m)   Wt 184 lb (83.5 kg)   SpO2 94%   BMI 26.40 kg/m        Wt Readings from Last 3 Encounters:  02/01/24 184 lb (83.5 kg)  08/06/23 186 lb 4 oz (84.5 kg)  07/23/23 178 lb (80.7 kg)    GEN: Well nourished, well developed in no acute distress NECK: No JVD; No carotid bruits CARDIAC: Irreg IRreg, no murmurs, rubs, gallops RESPIRATORY:  Clear to auscultation without rales, wheezing or rhonchi  ABDOMEN: Soft, non-tender, non-distended EXTREMITIES:  No edema; No deformity   ASSESSMENT AND PLAN  Permanent afib EKG shows Afib with HR 93bpm. BP has  been soft and Losartan  previously stopped. Patient reports occasional orthostatic symptoms. I will decrease Lopressor  50mg  BID.   CAD Patient denies angina. He has chest pain from coughing and breathing has improved.   Chronic systolic heart failure LVEF 40-45% Repeat echo 07/2023 showed LVEF 40-45%, mild to mod MR. Patient is euvolemic. Continue lasix  20mg  daily, lopressor  , Farxiga  10mg  daily, and spironolactone  25mg  daily.   Pre-operative cardiac evaluation for left sided sinus mass, recurrent nose bleeds and recurrent sinusitis Patient is overall stable from a cardiac perspective. He is euvolemic on exam. EKG shows rate controlled Afib. Surgery scheduled for 02/15/2024.  We will contact surgeons office to ask for official request.  I read instructions and they are requesting to hold Eliquis  2 days before.  I suspect this will be fine.  Patient has no history of CVA.  No cardiac workup required prior to surgery. METs greater than 4 RCRI        Dispo: Follow-up in 6 months  Signed, Dariel Betzer VEAR Fishman, [name]

## 2024-02-02 ENCOUNTER — Other Ambulatory Visit: Payer: Self-pay | Admitting: Family Medicine

## 2024-02-02 ENCOUNTER — Other Ambulatory Visit: Payer: Self-pay | Admitting: Cardiovascular Disease

## 2024-02-02 DIAGNOSIS — E1142 Type 2 diabetes mellitus with diabetic polyneuropathy: Secondary | ICD-10-CM

## 2024-02-02 DIAGNOSIS — I4821 Permanent atrial fibrillation: Secondary | ICD-10-CM

## 2024-02-04 NOTE — Telephone Encounter (Signed)
 Rx- 01/17/24 #270- too soon Requested Prescriptions  Pending Prescriptions Disp Refills   gabapentin  (NEURONTIN ) 100 MG capsule [Pharmacy Med Name: Gabapentin  100 MG Oral Capsule] 270 capsule 0    Sig: TAKE 1 CAPSULE BY MOUTH THREE TIMES DAILY     Neurology: Anticonvulsants - gabapentin  Passed - 02/04/2024  4:21 PM      Passed - Cr in normal range and within 360 days    Creat  Date Value Ref Range Status  08/07/2023 1.16 0.70 - 1.28 mg/dL Final   Creatinine, Urine  Date Value Ref Range Status  08/07/2023 73 20 - 320 mg/dL Final         Passed - Completed PHQ-2 or PHQ-9 in the last 360 days      Passed - Valid encounter within last 12 months    Recent Outpatient Visits           6 months ago Acute non-recurrent frontal sinusitis   [facility] Edman Marsa PARAS, DO   7 months ago Left wrist pain   [place] [facility] [place], Angeline ORN, NP       Future Appointments             In 5 months Furth, [name], [name] [employer] at [place]

## 2024-02-04 NOTE — Telephone Encounter (Signed)
 Prescription refill request for Eliquis  received. Indication:afib Last office visit:10/25 Scr:1.4  9/25 Age: 78 Weight:83.5  kg  Prescription refilled

## 2024-02-05 ENCOUNTER — Telehealth: Payer: Self-pay

## 2024-02-05 NOTE — Telephone Encounter (Addendum)
 Called pt to inform of the following recommendations. Left message to call back.    ----- Message from Cadence VEAR Fishman sent at 02/05/2024  7:59 AM EDT ----- Please let patient know MD recs for surgery ----- Message ----- From: Darron Deatrice LABOR, MD Sent: 02/04/2024   1:15 PM EDT To: Cadence VEAR Fishman, [name]  Yes, it is fine to hold Eliquis  2 days before surgery. ----- Message ----- From: Fishman Mikey VEAR, [name] Sent: 02/04/2024   7:53 AM EDT To: Deatrice LABOR Darron, MD  Pt needing surgery for sinus mass removal. Ok to hold Eliquis  2 days prior to surgery. He has no prior stroke risk.

## 2024-02-05 NOTE — Telephone Encounter (Signed)
 Patient returned RN's call.

## 2024-02-05 NOTE — Telephone Encounter (Signed)
Pt made aware of MD's recommendations and verbalized understanding.

## 2024-02-11 ENCOUNTER — Other Ambulatory Visit: Admitting: Pharmacist

## 2024-02-11 ENCOUNTER — Telehealth: Payer: Self-pay

## 2024-02-11 ENCOUNTER — Telehealth: Payer: Self-pay | Admitting: Cardiovascular Disease

## 2024-02-11 DIAGNOSIS — Z7984 Long term (current) use of oral hypoglycemic drugs: Secondary | ICD-10-CM

## 2024-02-11 DIAGNOSIS — E119 Type 2 diabetes mellitus without complications: Secondary | ICD-10-CM

## 2024-02-11 DIAGNOSIS — E1142 Type 2 diabetes mellitus with diabetic polyneuropathy: Secondary | ICD-10-CM

## 2024-02-11 DIAGNOSIS — J432 Centrilobular emphysema: Secondary | ICD-10-CM

## 2024-02-11 DIAGNOSIS — I5022 Chronic systolic (congestive) heart failure: Secondary | ICD-10-CM

## 2024-02-11 DIAGNOSIS — I129 Hypertensive chronic kidney disease with stage 1 through stage 4 chronic kidney disease, or unspecified chronic kidney disease: Secondary | ICD-10-CM

## 2024-02-11 NOTE — Telephone Encounter (Signed)
 Pt c/o BP issue: STAT if pt c/o blurred vision, one-sided weakness or slurred speech.  STAT if BP is GREATER than 180/120 TODAY.  STAT if BP is LESS than 90/60 and SYMPTOMATIC TODAY  1. What is your BP concern? Low BP  2. Have you taken any BP medication today? Yes  3. What are your last 5 BP readings?  95/67 HR 58 This morning  4. Are you having any other symptoms (ex. Dizziness, headache, blurred vision, passed out)? No

## 2024-02-11 NOTE — Patient Instructions (Signed)
 Goals Addressed             This Visit's Progress    Pharmacy Goals       Our goal A1c is less than 7%. This corresponds with fasting sugars less than 130 and 2 hour after meal sugars less than 180. Please check your blood sugar and keep log of results.  Please continue to monitor your home weight, blood pressure and heart rate  Our goal bad cholesterol, or LDL, is less than 70 . This is why it is important to continue taking your atorvastatin   Feel free to call me with any questions or concerns. I look forward to our next call!  Sharyle Sia, PharmD, BCACP Clinical Pharmacist [facility] [phone]        Check your blood pressure once daily, and any time you have concerning symptoms like headache, chest pain, dizziness, shortness of breath, or vision changes.    To appropriately check your blood pressure, make sure you do the following:  1) Avoid caffeine, exercise, or tobacco products for 30 minutes before checking. Empty your bladder. 2) Sit with your back supported in a flat-backed chair. Rest your arm on something flat (arm of the chair, table, etc). 3) Sit still with your feet flat on the floor, resting, for at least 5 minutes.  4) Check your blood pressure. Take 1-2 readings.  5) Write down these readings and bring with you to any provider appointments.  Bring your home blood pressure machine with you to a provider's office for accuracy comparison at least once a year.   Make sure you take your blood pressure medications before you come to any office visit, even if you were asked to fast for labs.

## 2024-02-11 NOTE — Telephone Encounter (Signed)
 PAP: Patient assistance application for Breztri  and Farxiga  through [employer] (AZ[employer]Me) has been mailed to pt's home address on file. Provider portion of application will be faxed to provider's office.

## 2024-02-11 NOTE — Progress Notes (Signed)
 02/11/2024 Name: Philip EKHOLM Sr. MRN: [id number] DOB: 15-Jun-1945  Chief Complaint  Patient presents with   Medication Management   Medication Assistance    Philip SORTER Sr. is a 78 y.o. year old male who presented for a telephone visit.   They were referred to the pharmacist by their PCP for assistance in managing diabetes, hypertension, hyperlipidemia, and medication access.    Subjective:   Care Team: Primary Care Provider: Edman Marsa PARAS, DO Cardiologist: Darron Deatrice LABOR, MD; Next Scheduled Visit: 08/01/2024 Pulmonologist: Theotis Lavelle BRAVO, MD; Next Scheduled Visit: 07/24/2024 Orthopedics: Hooten, James Philmon, MD; Next Scheduled Visit: 03/11/2024 Endocrinologist: Mariel Glenys Gunner, NP; Next Scheduled Visit: 06/02/2024  Medication Access/Adherence  Current Pharmacy:  [facility] [address] ([address]), [place] - [address] [place] ([place]) [place] [address] Phone: [phone] Fax: [phone]   Patient reports affordability concerns with their medications: No  Patient reports access/transportation concerns to their pharmacy: No  Patient reports adherence concerns with their medications:  No     Patient uses weekly pillbox    Reports scheduled for sinus surgery on 02/15/2024   Type 2 Diabetes:   Current medications: metformin  1,000 mg twice daily  Farxiga  10 mg - 1 tablet QAM     Current glucose readings:   Fasting Blood Glucose After Supper/Bedtime     7 - October 105 165  8 - October 133 174  9 - October 130 159  10 - October 119 159  11 - October 119 105  12 - October 130 210*  13 - October 147     * Attributes elevated reading to carbohydrate snacks at supper time  Reports making significant changes to reduce sweets in his diet and avoid sugary beverages   Reports using blood sugar readings as feedback on dietary choices   Denies symptoms of hypoglycemia   Current medication access support:  enrolled in patient assistance for Farxiga  from [employer] through 04/30/2024      CHF/Hypertension/ Atrial Fibrillation:   Patient followed by [facility]  Current medications:  Metoprolol  50 mg twice daily - confirms reduced to current dose as directed by Cardiologist on 02/01/2024 Spironolactone  25 mg - 1/2 tablet (12.5 mg) daily  Farxiga  10 mg - 1 tablets daily Furosemide  20 mg daily   Anticoagulation: Eliquis  5 mg twice daily   Patient has an automated, upper arm home BP cuff Current blood pressure readings readings:  - Last checked this morning: 95/67, HR 58 - Recalls that his systolic blood pressure readings have been running in 90s for the past week   Reports had an episode on 10/6 following exertion (carrying a heavy load) when felt lightheaded/significantly fatigued. Reports symptoms improved with rest    Confirms monitoring home weight daily as directed by Cardiology; reports home weight stable ~177 lbs    Current medication access support: enrolled in patient assistance for Farxiga  from [employer]Me through 04/30/2024      COPD/seasonal allergies: Patient follows with Dr. Theotis at [facility]   Current medications: Breztri  2 puffs twice daily Montelukast  10 mg QHS Albuterol  inhaler as needed Cetirizine  10 mg daily Flonase  nasal spray as needed     Confirms using Breztri  inhaler consistently as directed and rinsing mouth out after each use   Avoiding being outdoors in higher humidity to aid with breathing   Current medication access support: Enrolled in Breztri  patient assistance program through [employer] through 04/30/2024   Objective:  Lab  Results  Component Value Date   HGBA1C 8.0 (H) 08/07/2023    Lab Results  Component Value Date   CREATININE 1.16 08/07/2023   BUN 21 08/07/2023   NA 139 08/07/2023   K 4.9 08/07/2023   CL 101 08/07/2023   CO2 29 08/07/2023    Lab Results  Component Value Date   CHOL 113 08/07/2023    HDL 48 08/07/2023   LDLCALC 47 08/07/2023   TRIG 101 08/07/2023   CHOLHDL 2.4 08/07/2023   BP Readings from Last 3 Encounters:  02/01/24 (!) 100/50  08/06/23 100/70  07/23/23 122/84   Pulse Readings from Last 3 Encounters:  02/01/24 93  08/06/23 89  07/23/23 85    Medications Reviewed Today     Reviewed by Alana Sharyle LABOR, RPH-CPP (Pharmacist) on 02/11/24 at 0957  Med List Status: <None>   Medication Order Taking? Sig Documenting Provider Last Dose Status Informant  ACCU-CHEK GUIDE TEST test strip [id number]  Use to check blood sugar up to 2 x daily Edman Marsa PARAS, DO  Active   Accu-Chek Softclix Lancets lancets [id number]  Use to check blood sugar up to 2 x daily Edman Marsa PARAS, DO  Active   acetaminophen  (TYLENOL ) 500 MG tablet [id number]  Take 1,000 mg by mouth every 8 (eight) hours as needed for moderate pain or mild pain. [provider]  Active Self  albuterol  (VENTOLIN  HFA) 108 (90 Base) MCG/ACT inhaler [id number]  Inhale 2 puffs into the lungs every 4 (four) hours as needed for wheezing or shortness of breath. Edman Marsa PARAS, DO  Active   apixaban  (ELIQUIS ) 5 MG TABS tablet [id number] Yes Take 1 tablet by mouth twice daily Darron Deatrice LABOR, MD  Active   ARTIFICIAL TEAR SOLUTION OP [id number]  Place 1 drop into both eyes daily. [provider]  Active Self  atorvastatin  (LIPITOR) 40 MG tablet [id number]  Take 1 tablet (40 mg total) by mouth daily. Darron Deatrice LABOR, MD  Active   Blood Glucose Monitoring Suppl (ACCU-CHEK GUIDE ME) w/Device KIT [id number]  Use to check blood sugar up to 2 x daily Edman Marsa PARAS, DO  Active   Blood Glucose Monitoring Suppl (ONE TOUCH ULTRA 2) w/Device KIT [id number]  Use to check blood sugar as advised up to twice a day Edman Marsa PARAS, DO  Active Self  BREZTRI  AEROSPHERE 160-9-4.8 MCG/ACT TERESE [id number]  Inhale 2 puffs into the lungs 2 (two) times daily. [name], Marsa PARAS, DO   Active   calcium  carbonate (TUMS - DOSED IN MG ELEMENTAL CALCIUM ) 500 MG chewable tablet [id number]  Chew 1,000 mg by mouth daily as needed for indigestion or heartburn. [provider]  Active Self  cetirizine  (ZYRTEC ) 10 MG tablet [id number]  Take 10 mg by mouth daily. [provider]  Active Self  cyanocobalamin (VITAMIN B12) 1000 MCG tablet [id number]  Take 1,000 mcg by mouth daily. [provider]  Active   FARXIGA  10 MG TABS tablet [id number] Yes Take 1 tablet (10 mg total) by mouth daily before breakfast. Edman Marsa PARAS, DO  Active   fluticasone  (FLONASE ) 50 MCG/ACT nasal spray [id number]  Place 2 sprays into both nostrils daily. Edman Marsa PARAS, DO  Active   furosemide  (LASIX ) 20 MG tablet [id number] Yes Take 1 tablet (20 mg total) by mouth daily. Franchester, [name], [name]  Active   gabapentin  (NEURONTIN ) 100 MG capsule [id number]  TAKE 1 CAPSULE BY MOUTH THREE TIMES DAILY Edman, Marsa PARAS, DO  Active  ipratropium-albuterol  (DUONEB) 0.5-2.5 (3) MG/3ML SOLN [id number]  Take 3 mLs by nebulization every 6 (six) hours as needed (shortness of breath). [provider]  Active Self           Med Note JERALYN DUNCANS A   Fri Dec 24, 2020  4:04 PM)    levothyroxine  (SYNTHROID ) 88 MCG tablet [id number]  TAKE 1 TABLET BY MOUTH DAILY BEFORE BREAKFAST Edman Marsa PARAS, DO  Active   metFORMIN  (GLUCOPHAGE ) 1000 MG tablet [id number] Yes TAKE 1 TABLET BY MOUTH TWICE DAILY WITH MEAL [name], Marsa PARAS, DO  Active   metoprolol  tartrate (LOPRESSOR ) 50 MG tablet [id number] Yes Take 1 tablet (50 mg total) by mouth 2 (two) times daily. Furth, [name], [name]  Active   montelukast  (SINGULAIR ) 10 MG tablet [id number]  Take 1 tablet by mouth at bedtime. [provider]  Active   spironolactone  (ALDACTONE ) 25 MG tablet [id number] Yes Take 1/2 (one-half) tablet by mouth once daily Darron Deatrice LABOR, MD  Active               Assessment/Plan:    Diabetes: - Counseled on importance of continuing to control carbohydrate portion sizes and using blood sugar checks to reinforce dietary choices Encourage patient to continue to limit sweets and sugary beverages (sweet tea, Pepsi) - Recommend to continue to check glucose, keep log of results, to bring this record to medical appointments and use readings as feedback on dietary choices - Patient to follow up with AZ&Me assistance program as needed regarding refills of Farxiga  - Will collaborate with CPhT to request assistance to patient with re-enrollment in [employer] patient assistance program - Encourage patient to schedule annual eye exam   CHF/Hypertension/Atrial Fibrillation: - Encourage patient to continue to weigh daily - Recommended to continue to check home blood pressure and heart rate, keep log of results and contact Cardiologist for BP and HR if needed for readings outside of established parameters or symptoms  - Recommend that patient contact Cardiology office today to follow up regarding recent home blood pressure readings as well as episode of lightheadedness on 10/6  Confirms will contact Cardiologist today - Will collaborate with CPhT to request assistance to patient with re-enrollment in [employer] patient assistance program   COPD/Seasonal Allergies: - Reviewed appropriate inhaler technique - Patient to follow up with [employer]Me assistance program as needed regarding refills of Breztri  inhaler - Will collaborate with CPhT to request assistance to patient with re-enrollment in [employer] patient assistance program   Follow Up Plan: Clinical Pharmacist will follow up with patient by telephone on 03/17/2024 at 9:30 AM       Sharyle Sia, PharmD, JAQUELINE, CPP Clinical Pharmacist [facility] [phone]

## 2024-02-11 NOTE — Telephone Encounter (Signed)
 Called patient and left message for call back.

## 2024-02-12 NOTE — Telephone Encounter (Signed)
 Received provider portion pap application [employer] Breztri  & Farxiga 

## 2024-02-15 DIAGNOSIS — I4891 Unspecified atrial fibrillation: Secondary | ICD-10-CM | POA: Diagnosis not present

## 2024-02-26 DIAGNOSIS — J3489 Other specified disorders of nose and nasal sinuses: Secondary | ICD-10-CM | POA: Diagnosis not present

## 2024-03-07 LAB — OPHTHALMOLOGY REPORT-SCANNED

## 2024-03-17 ENCOUNTER — Other Ambulatory Visit: Admitting: Pharmacist

## 2024-03-17 DIAGNOSIS — I129 Hypertensive chronic kidney disease with stage 1 through stage 4 chronic kidney disease, or unspecified chronic kidney disease: Secondary | ICD-10-CM

## 2024-03-17 DIAGNOSIS — I5022 Chronic systolic (congestive) heart failure: Secondary | ICD-10-CM

## 2024-03-17 DIAGNOSIS — E119 Type 2 diabetes mellitus without complications: Secondary | ICD-10-CM

## 2024-03-17 DIAGNOSIS — E1142 Type 2 diabetes mellitus with diabetic polyneuropathy: Secondary | ICD-10-CM

## 2024-03-17 DIAGNOSIS — J432 Centrilobular emphysema: Secondary | ICD-10-CM

## 2024-03-17 NOTE — Progress Notes (Signed)
 03/17/2024 Name: Philip WINTLE Sr. MRN: [id number] DOB: [birth year]  Chief Complaint  Patient presents with   Medication Management   Medication Adherence    Philip AESCHLIMAN Sr. is a 78 y.o. year old male who presented for a telephone visit.   They were referred to the pharmacist by their PCP for assistance in managing diabetes, hypertension, hyperlipidemia, and medication access.    Subjective:   Care Team: Primary Care Provider: Edman Marsa PARAS, DO Cardiologist: Darron Deatrice LABOR, MD; Next Scheduled Visit: 08/01/2024 Pulmonologist: Theotis Lavelle BRAVO, MD; Next Scheduled Visit: 07/24/2024 Orthopedics: Mardee Lynwood Lucy, MD; Endocrinologist: Mariel Glenys Gunner, NP; Next Scheduled Visit: 06/02/2024 Otolaryngologist: Danita Pauletta Shown, MD; Next Scheduled Visit: 05/06/2024   Medication Access/Adherence  Current Pharmacy:  [facility] [address] ([address]), [place] - [address] [place] ([place]) [place] [address] Phone: [phone] Fax: [phone]   Patient reports affordability concerns with their medications: No  Patient reports access/transportation concerns to their pharmacy: No  Patient reports adherence concerns with their medications:  No     Patient uses weekly pillbox    Reports sinus surgery on 02/15/2024 went well and noticing an improvement in breathing and sleeping   Type 2 Diabetes:   Current medications: metformin  1,000 mg twice daily  Farxiga  10 mg - 1 tablet QAM     Current glucose readings:   Fasting Blood Glucose After Supper/Bedtime  11 - November 126 -  12 - November 118 136  13 - November 115 177  14 - November 134 163  15 - November 142 172  16 - November 124 136  17 - November 124       Reports maintaining significant changes to reduce sweets in his diet and avoid sugary beverages   Reports using blood sugar readings as feedback on dietary choices   Denies symptoms of hypoglycemia   Current  medication access support: enrolled in patient assistance for Farxiga  from [employer] through 04/30/2024   - From review of chart, note CPhT mailed application for renewal to patient on 02/11/2024, but today patient denies having yet received this application    CHF/Hypertension/ Atrial Fibrillation:   Patient followed by [facility]   Current medications:  Metoprolol  50 mg twice daily  Spironolactone  25 mg - 1/2 tablet (12.5 mg) daily  Farxiga  10 mg - 1 tablets daily Furosemide  20 mg daily   Anticoagulation: Eliquis  5 mg twice daily   Patient has an automated, upper arm home BP cuff Current blood pressure readings readings:  - Last checked this morning: 111/70, HR 70   Denies symptoms of hypotension such as dizziness or lightheadedness    Confirms monitoring home weight daily as directed by Cardiology; reports home weight stable ~175 lbs    Current medication access support: enrolled in patient assistance for Farxiga  from [employer] through 04/30/2024   - From review of chart, note CPhT mailed application for renewal to patient on 02/11/2024, but today patient denies having yet received this application   COPD/seasonal allergies: Patient follows with Dr. Theotis at [facility] Pulmonology   Current medications: Breztri  2 puffs twice daily Montelukast  10 mg QHS Albuterol  inhaler as needed Cetirizine  10 mg daily Flonase  nasal spray as needed     Confirms using Breztri  inhaler consistently as directed and rinsing mouth out after each use   Avoids being outdoors in higher humidity to aid with breathing   Current medication access support: Enrolled in Breztri  patient assistance  program through [employer] through 04/30/2024 - From review of chart, note CPhT mailed application for renewal to patient on 02/11/2024, but today patient denies having yet received this application  Objective:  Lab Results  Component Value Date   HGBA1C 8.0 (H) 08/07/2023    Lab  Results  Component Value Date   CREATININE 1.16 08/07/2023   BUN 21 08/07/2023   NA 139 08/07/2023   K 4.9 08/07/2023   CL 101 08/07/2023   CO2 29 08/07/2023    Lab Results  Component Value Date   CHOL 113 08/07/2023   HDL 48 08/07/2023   LDLCALC 47 08/07/2023   TRIG 101 08/07/2023   CHOLHDL 2.4 08/07/2023   BP Readings from Last 3 Encounters:  02/01/24 (!) 100/50  08/06/23 100/70  07/23/23 122/84   Pulse Readings from Last 3 Encounters:  02/01/24 93  08/06/23 89  07/23/23 85     Medications Reviewed Today     Reviewed by Alana Sharyle LABOR, RPH-CPP (Pharmacist) on 03/17/24 at 1002  Med List Status: <None>   Medication Order Taking? Sig Documenting Provider Last Dose Status Informant  ACCU-CHEK GUIDE TEST test strip [id number]  Use to check blood sugar up to 2 x daily Edman Marsa PARAS, DO  Active   Accu-Chek Softclix Lancets lancets [id number]  Use to check blood sugar up to 2 x daily Edman Marsa PARAS, DO  Active   acetaminophen  (TYLENOL ) 500 MG tablet [id number]  Take 1,000 mg by mouth every 8 (eight) hours as needed for moderate pain or mild pain. [provider]  Active Self  albuterol  (VENTOLIN  HFA) 108 (90 Base) MCG/ACT inhaler [id number] Yes Inhale 2 puffs into the lungs every 4 (four) hours as needed for wheezing or shortness of breath. Edman Marsa PARAS, DO  Active   apixaban  (ELIQUIS ) 5 MG TABS tablet [id number]  Take 1 tablet by mouth twice daily Darron Deatrice LABOR, MD  Active   ARTIFICIAL TEAR SOLUTION OP [id number]  Place 1 drop into both eyes daily. [provider]  Active Self  atorvastatin  (LIPITOR) 40 MG tablet [id number]  Take 1 tablet (40 mg total) by mouth daily. Darron Deatrice LABOR, MD  Active   Blood Glucose Monitoring Suppl (ACCU-CHEK GUIDE ME) w/Device KIT [id number]  Use to check blood sugar up to 2 x daily Edman Marsa PARAS, DO  Active   Blood Glucose Monitoring Suppl (ONE TOUCH ULTRA 2) w/Device KIT [id number]   Use to check blood sugar as advised up to twice a day Edman Marsa PARAS, DO  Active Self  BREZTRI  AEROSPHERE 160-9-4.8 MCG/ACT TERESE [id number] Yes Inhale 2 puffs into the lungs 2 (two) times daily. [name], Marsa PARAS, DO  Active   calcium  carbonate (TUMS - DOSED IN MG ELEMENTAL CALCIUM ) 500 MG chewable tablet [id number]  Chew 1,000 mg by mouth daily as needed for indigestion or heartburn. [provider]  Active Self  cetirizine  (ZYRTEC ) 10 MG tablet [id number] Yes Take 10 mg by mouth daily. [provider]  Active Self  cyanocobalamin (VITAMIN B12) 1000 MCG tablet [id number]  Take 1,000 mcg by mouth daily. [provider]  Active   FARXIGA  10 MG TABS tablet [id number] Yes Take 1 tablet (10 mg total) by mouth daily before breakfast. Edman Marsa PARAS, DO  Active   fluticasone  (FLONASE ) 50 MCG/ACT nasal spray [id number] Yes Place 2 sprays into both nostrils daily. Edman Marsa PARAS, DO  Active   furosemide  (LASIX ) 20 MG tablet [id number] Yes Take 1 tablet (20 mg total)  by mouth daily. Franchester, [name], [name]  Active   gabapentin  (NEURONTIN ) 100 MG capsule [id number]  TAKE 1 CAPSULE BY MOUTH THREE TIMES DAILY [name], Marsa PARAS, DO  Active   ipratropium-albuterol  (DUONEB) 0.5-2.5 (3) MG/3ML SOLN [id number]  Take 3 mLs by nebulization every 6 (six) hours as needed (shortness of breath). [provider]  Active Self           Med Note JERALYN DUNCANS A   Fri Dec 24, 2020  4:04 PM)    levothyroxine  (SYNTHROID ) 88 MCG tablet [id number]  TAKE 1 TABLET BY MOUTH DAILY BEFORE BREAKFAST Edman Marsa PARAS, DO  Active   metFORMIN  (GLUCOPHAGE ) 1000 MG tablet [id number] Yes TAKE 1 TABLET BY MOUTH TWICE DAILY WITH MEAL [name], Marsa PARAS, DO  Active   metoprolol  tartrate (LOPRESSOR ) 50 MG tablet [id number] Yes Take 1 tablet (50 mg total) by mouth 2 (two) times daily. Furth, [name], [name]  Active   montelukast  (SINGULAIR ) 10 MG tablet [id number] Yes  Take 1 tablet by mouth at bedtime. [provider]  Active   spironolactone  (ALDACTONE ) 25 MG tablet [id number] Yes Take 1/2 (one-half) tablet by mouth once daily Darron Deatrice LABOR, MD  Active               Assessment/Plan:   Encourage patient to call to schedule next follow up appointment with PCP  Diabetes: - Counseled on importance of continuing to control carbohydrate portion sizes and using blood sugar checks to reinforce dietary choices Encourage patient to continue to limit sweets and sugary beverages (sweet tea, Pepsi) - Recommend to continue to check glucose, keep log of results, to bring this record to medical appointments and use readings as feedback on dietary choices - Patient to follow up with [employer]Me assistance program as needed regarding refills of Farxiga  - Collaborating with CPhT to request assistance to patient with re-enrollment in [employer] patient assistance program  Will ask CPhT to re-mail application to patient   CHF/Hypertension/Atrial Fibrillation: - Encourage patient to continue to weigh daily - Recommended to continue to check home blood pressure and heart rate, keep log of results and contact Cardiologist for BP and HR if needed for readings outside of established parameters or symptoms  - Collaborating with CPhT to request assistance to patient with re-enrollment in [employer] patient assistance program  Will ask CPhT to re-mail application to patient   COPD/Seasonal Allergies: - Reviewed appropriate inhaler technique - Patient to follow up with [employer]Me assistance program as needed regarding refills of Breztri  inhaler - Collaborating with CPhT to request assistance to patient with re-enrollment in [employer] patient assistance program  Will ask CPhT to re-mail application to patient   Health Maintenance - Discussed CDC/ACIP recommendations for influenza, Shingles and Tetanus vaccines. Patient to follow up with local pharmacy for vaccinations. Reports received  1st dose of Shingrix vaccine in the past, but will schedule second part with his pharmacy   Follow Up Plan: Clinical Pharmacist will follow up with patient by telephone on 05/26/2024 at 9:00 AM      Sharyle Sia, PharmD, JAQUELINE, CPP Clinical Pharmacist [facility] [phone]

## 2024-03-17 NOTE — Patient Instructions (Signed)
Goals Addressed             This Visit's Progress    Pharmacy Goals       Our goal A1c is less than 7%. This corresponds with fasting sugars less than 130 and 2 hour after meal sugars less than 180. Please check your blood sugar and keep log of results  Please continue to monitor your home weight, blood pressure and heart rate  Our goal bad cholesterol, or LDL, is less than 70 . This is why it is important to continue taking your atorvastatin  Feel free to call me with any questions or concerns. I look forward to our next call!    [name], PharmD, [facility] Clinical Pharmacist [facility] [phone]

## 2024-03-19 ENCOUNTER — Other Ambulatory Visit: Payer: Self-pay | Admitting: Family Medicine

## 2024-03-19 DIAGNOSIS — J0110 Acute frontal sinusitis, unspecified: Secondary | ICD-10-CM

## 2024-03-21 NOTE — Telephone Encounter (Signed)
 Requested Prescriptions  Pending Prescriptions Disp Refills   fluticasone  (FLONASE ) 50 MCG/ACT nasal spray [Pharmacy Med Name: Fluticasone  Propionate 50 MCG/ACT Nasal Suspension] 16 g 0    Sig: Use 2 spray(s) in each nostril once daily     Ear, Nose, and Throat: Nasal Preparations - Corticosteroids Passed - 03/21/2024  3:46 PM      Passed - Valid encounter within last 12 months    Recent Outpatient Visits           8 months ago Acute non-recurrent frontal sinusitis   [facility] Edman Marsa PARAS, DO   9 months ago Left wrist pain   [place] [facility] [place], Angeline ORN, NP       Future Appointments             In 4 months Furth, [name], [name] [employer] at [place]

## 2024-04-07 NOTE — Telephone Encounter (Signed)
 Reached out to patient regarding PAP applications Brez[employer]  & Farxiga  (AZ[employer]).  He did receive application and will return soon.

## 2024-04-10 ENCOUNTER — Ambulatory Visit: Payer: Self-pay

## 2024-04-10 NOTE — Telephone Encounter (Signed)
 Rescheduled for Monday per patient. He is wanting to come in for x-rays specifically.

## 2024-04-10 NOTE — Telephone Encounter (Signed)
 FYI Only or Action Required?: FYI only for provider: appointment scheduled on 04/11/24.  Patient was last seen in primary care on 07/23/2023 by Edman Marsa PARAS, DO.  Called Nurse Triage reporting Fall.  Symptoms began yesterday.  Interventions attempted: OTC medications: Tylenol , Rest, hydration, or home remedies, and Ice/heat application.  Symptoms are: stable.  Triage Disposition: See PCP When Office is Open (Within 3 Days)  Patient/caregiver understands and will follow disposition?: Yes Reason for Disposition  [1] Taking Coumadin (warfarin) or other strong blood thinner AND [2] falling is a recurrent problem  Answer Assessment - Initial Assessment Questions Patient is taking tylenol  and using heat. Patient takes Eliquis .    1. MECHANISM: How did the fall happen?     Wife fell and patient was helping wife get up and patient fell  2. DOMESTIC VIOLENCE AND ELDER ABUSE SCREENING: Did you fall because someone pushed you or tried to hurt you? If Yes, ask: Are you safe now?     N/a  3. ONSET: When did the fall happen? (e.g., minutes, hours, or days ago)     Last night  4. LOCATION: What part of the body hit the ground? (e.g., back, buttocks, head, hips, knees, hands, head, stomach)     Buttocks  5. INJURY: Did you hurt (injure) yourself when you fell? If Yes, ask: What did you injure? Tell me more about this? (e.g., body area; type of injury; pain severity)     Buttocks  6. PAIN: Is there any pain? If Yes, ask: How bad is the pain? (e.g., Scale 0-10; or none, mild,      Hurts to sit, cough, walk, sometimes its a 4-5/10 or a 10/10  7. SIZE: For cuts, bruises, or swelling, ask: How large is it? (e.g., inches or centimeters)      Bruise the size of silver dollar  9. OTHER SYMPTOMS: Do you have any other symptoms? (e.g., dizziness, fever, weakness; new-onset or worsening).      Denies  Protocols used: Falls and [facility]  Copied from CRM  #[id number]. Topic: Clinical - Red Word Triage >> Apr 10, 2024 10:18 AM Leonette P wrote: Red Word that prompted transfer to Nurse Triage: Pt had a fall last night and fell on his bottom.

## 2024-04-10 NOTE — Telephone Encounter (Signed)
 Will discuss at upcoming appointment tomorrow.  I know that he is wanting an x-ray.  We do not have x-ray in this office tomorrow.  If he is wanting x-ray would recommend urgent care.

## 2024-04-11 ENCOUNTER — Ambulatory Visit: Admitting: Internal Medicine

## 2024-04-14 ENCOUNTER — Other Ambulatory Visit: Payer: Self-pay | Admitting: Internal Medicine

## 2024-04-14 ENCOUNTER — Ambulatory Visit
Admission: RE | Admit: 2024-04-14 | Discharge: 2024-04-14 | Disposition: A | Source: Ambulatory Visit | Attending: Internal Medicine | Admitting: Internal Medicine

## 2024-04-14 ENCOUNTER — Ambulatory Visit: Payer: Self-pay | Admitting: Internal Medicine

## 2024-04-14 ENCOUNTER — Ambulatory Visit: Admitting: Internal Medicine

## 2024-04-14 ENCOUNTER — Encounter: Payer: Self-pay | Admitting: Internal Medicine

## 2024-04-14 VITALS — BP 92/60 | Ht 70.0 in | Wt 178.4 lb

## 2024-04-14 DIAGNOSIS — M25552 Pain in left hip: Secondary | ICD-10-CM | POA: Diagnosis not present

## 2024-04-14 DIAGNOSIS — M545 Low back pain, unspecified: Secondary | ICD-10-CM

## 2024-04-14 DIAGNOSIS — S300XXA Contusion of lower back and pelvis, initial encounter: Secondary | ICD-10-CM | POA: Insufficient documentation

## 2024-04-14 DIAGNOSIS — W010XXA Fall on same level from slipping, tripping and stumbling without subsequent striking against object, initial encounter: Secondary | ICD-10-CM

## 2024-04-14 DIAGNOSIS — G8929 Other chronic pain: Secondary | ICD-10-CM | POA: Insufficient documentation

## 2024-04-14 DIAGNOSIS — M48061 Spinal stenosis, lumbar region without neurogenic claudication: Secondary | ICD-10-CM | POA: Diagnosis not present

## 2024-04-14 DIAGNOSIS — M47816 Spondylosis without myelopathy or radiculopathy, lumbar region: Secondary | ICD-10-CM | POA: Diagnosis not present

## 2024-04-14 DIAGNOSIS — M25551 Pain in right hip: Secondary | ICD-10-CM | POA: Diagnosis present

## 2024-04-14 DIAGNOSIS — M160 Bilateral primary osteoarthritis of hip: Secondary | ICD-10-CM | POA: Insufficient documentation

## 2024-04-14 DIAGNOSIS — M25752 Osteophyte, left hip: Secondary | ICD-10-CM | POA: Diagnosis not present

## 2024-04-14 DIAGNOSIS — M25751 Osteophyte, right hip: Secondary | ICD-10-CM | POA: Insufficient documentation

## 2024-04-14 NOTE — Progress Notes (Addendum)
 Subjective:    Patient ID: Philip [name]., male    DOB: [birth year], 78 y.o.   MRN: [id number]  HPI  Discussed the use of AI scribe software for clinical note transcription with the patient, who gave verbal consent to proceed.  Philip [name] Sr. is a 78 year old male with degenerative disc disease and arthritis of his back, hips and knees who presents with lower back and hip pain after a fall.  He experienced lower back pain following a fall on Friday while assisting his wife, who had also fallen. During the attempt to lift her, they both fell backwards, resulting in him landing on his buttocks. The most significant pain is localized in the lower back, particularly when transitioning from lying flat to sitting up. No radiation of pain down the legs and no loss of bowel or bladder control.  He has been managing the pain with Tylenol , which has been somewhat effective. He has also been applying heat to the area, though he is uncertain of its efficacy. His medical history includes degenerative disc disease and arthritis, which he has experienced throughout his body. He is currently on Eliquis .  No history of hip replacements but has had a knee replacement in the past.       Review of Systems   Past Medical History:  Diagnosis Date   (HFpEF) heart failure with preserved ejection fraction (HCC)    a. 06/2015 Echo: EF 50-55%, Gr1 DD. Mild MR. Mildly dil LA. Nl RV fxn. Nl PASP.   Arthritis    a. right knee   Asthma    Atrial tachycardia    Chronic anticoagulation    Apixaban    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 etiology.   COPD (chronic obstructive pulmonary disease) (HCC)    Coronary artery disease    a. 05/2012 Cath: severe 3VD-->CABG x 3 by Dr. Army in 01/07/2014BETHA PIEDRA, SVG->LCX & SVG->RPDA; b. 06/2014 MV: no ischemia/infarct.   DDD (degenerative disc disease), lumbar    Dyspnea    Edema    LEGS/ FEET   Essential hypertension    History of Helicobacter pylori  infection    HNP (herniated nucleus pulposus), lumbar    HOH (hard of hearing)    Hypercholesteremia    Hypothyroidism    Low back pain radiating to lower extremity    Lumbar radiculitis    Lumbar stenosis with neurogenic claudication    Neuropathy    PAF (paroxysmal atrial fibrillation) (HCC)    a. CHA2DS2VASc = 5 (age, CHF, HTN, vascular disase, T2DM) --> Eliquis .   Sepsis (HCC) 2021   Shingles    Sleep apnea    NO CPAP   Type II diabetes mellitus (HCC)     Current Outpatient Medications  Medication Sig Dispense Refill   ACCU-CHEK GUIDE TEST test strip Use to check blood sugar up to 2 x daily 200 each 3   Accu-Chek Softclix Lancets lancets Use to check blood sugar up to 2 x daily 200 each 3   acetaminophen  (TYLENOL ) 500 MG tablet Take 1,000 mg by mouth every 8 (eight) hours as needed for moderate pain or mild pain.     albuterol  (VENTOLIN  HFA) 108 (90 Base) MCG/ACT inhaler Inhale 2 puffs into the lungs every 4 (four) hours as needed for wheezing or shortness of breath. 8 g 2   apixaban  (ELIQUIS ) 5 MG TABS tablet Take 1 tablet by mouth twice daily 180 tablet 1  ARTIFICIAL TEAR SOLUTION OP Place 1 drop into both eyes daily.     atorvastatin  (LIPITOR) 40 MG tablet Take 1 tablet (40 mg total) by mouth daily. 90 tablet 2   Blood Glucose Monitoring Suppl (ACCU-CHEK GUIDE ME) w/Device KIT Use to check blood sugar up to 2 x daily 1 kit 0   Blood Glucose Monitoring Suppl (ONE TOUCH ULTRA 2) w/Device KIT Use to check blood sugar as advised up to twice a day 1 kit 0   BREZTRI  AEROSPHERE 160-9-4.8 MCG/ACT AERO Inhale 2 puffs into the lungs 2 (two) times daily. 32.1 g 3   calcium  carbonate (TUMS - DOSED IN MG ELEMENTAL CALCIUM ) 500 MG chewable tablet Chew 1,000 mg by mouth daily as needed for indigestion or heartburn.     cetirizine  (ZYRTEC ) 10 MG tablet Take 10 mg by mouth daily.     cyanocobalamin (VITAMIN B12) 1000 MCG tablet Take 1,000 mcg by mouth daily.     FARXIGA  10 MG TABS tablet Take  1 tablet (10 mg total) by mouth daily before breakfast. 90 tablet 3   fluticasone  (FLONASE ) 50 MCG/ACT nasal spray Use 2 spray(s) in each nostril once daily 16 g 0   furosemide  (LASIX ) 20 MG tablet Take 1 tablet (20 mg total) by mouth daily. 90 tablet 3   gabapentin  (NEURONTIN ) 100 MG capsule TAKE 1 CAPSULE BY MOUTH THREE TIMES DAILY 270 capsule 0   ipratropium-albuterol  (DUONEB) 0.5-2.5 (3) MG/3ML SOLN Take 3 mLs by nebulization every 6 (six) hours as needed (shortness of breath).     levothyroxine  (SYNTHROID ) 88 MCG tablet TAKE 1 TABLET BY MOUTH DAILY BEFORE BREAKFAST 90 tablet 3   metFORMIN  (GLUCOPHAGE ) 1000 MG tablet TAKE 1 TABLET BY MOUTH TWICE DAILY WITH MEAL 180 tablet 0   metoprolol  tartrate (LOPRESSOR ) 50 MG tablet Take 1 tablet (50 mg total) by mouth 2 (two) times daily. 180 tablet 3   montelukast  (SINGULAIR ) 10 MG tablet Take 1 tablet by mouth at bedtime.     spironolactone  (ALDACTONE ) 25 MG tablet Take 1/2 (one-half) tablet by mouth once daily 45 tablet 1   No current facility-administered medications for this visit.    Allergies[1]  Family History  Problem Relation Age of Onset   Heart disease Mother    Clotting disorder Mother    Hypertension Sister     Social History   Socioeconomic History   Marital status: Married    Spouse name: Ronal Mon   Number of children: 4   Years of education: Not on file   Highest education level: Some college, no degree  Occupational History   Occupation: retired  Tobacco Use   Smoking status: Former    Current packs/day: 0.00    Average packs/day: 1 pack/day for 8.0 years (8.0 ttl pk-yrs)    Types: Cigarettes    Start date: 12/31/1959    Quit date: 12/31/1967    Years since quitting: 56.3   Smokeless tobacco: Former  Building Services Engineer status: Never Used  Substance and Sexual Activity   Alcohol  use: No    Alcohol /week: 0.0 standard drinks of alcohol     Comment: 05/06/2012 last alcohol  several years ago; never had problem wit    Drug use: No   Sexual activity: Not Currently  Other Topics Concern   Not on file  Social History Narrative   ** Merged History Encounter **          Working part time - not currently working because of covid-19  Social Drivers of Health   Tobacco Use: Medium Risk (03/15/2024)   Received from [facility]   Patient History    Smoking Tobacco Use: Former    Smokeless Tobacco Use: Never    Passive Exposure: Not on file  Financial Resource Strain: Low Risk  (03/11/2024)   Received from [facility]   Overall Financial Resource Strain (CARDIA)    Difficulty of Paying Living Expenses: Not very hard  Food Insecurity: No Food Insecurity (03/11/2024)   Received from [facility]   Epic    Within the past 12 months, you worried that your food would run out before you got the money to buy more.: Never true    Within the past 12 months, the food you bought just didn't last and you didn't have money to get more.: Never true  Transportation Needs: No Transportation Needs (03/11/2024)   Received from [facility] - Transportation    In the past 12 months, has lack of transportation kept you from medical appointments or from getting medications?: No    Lack of Transportation (Non-Medical): No  Physical Activity: Insufficiently Active (09/07/2023)   Exercise Vital Sign    Days of Exercise per Week: 3 days    Minutes of Exercise per Session: 30 min  Stress: No Stress Concern Present (09/07/2023)   [employer] of Occupational Health - Occupational Stress Questionnaire    Feeling of Stress : Only a little  Social Connections: Moderately Integrated (09/07/2023)   Social Connection and Isolation Panel    Frequency of Communication with Friends and Family: More than three times a week    Frequency of Social Gatherings with Friends and Family: Twice a week    Attends Religious Services: More than 4 times per year     Active Member of [employer] or Organizations: No    Attends Banker Meetings: Never    Marital Status: Married  Catering Manager Violence: Not At Risk (09/07/2023)   Humiliation, Afraid, Rape, and Kick questionnaire    Fear of Current or Ex-Partner: No    Emotionally Abused: No    Physically Abused: No    Sexually Abused: No  Depression (PHQ2-9): Low Risk (09/07/2023)   Depression (PHQ2-9)    PHQ-2 Score: 0  Alcohol  Screen: Low Risk (09/07/2023)   Alcohol  Screen    Last Alcohol  Screening Score (AUDIT): 0  Housing: Low Risk  (03/11/2024)   Received from [facility]   Epic    In the last 12 months, was there a time when you were not able to pay the mortgage or rent on time?: No    In the past 12 months, how many times have you moved where you were living?: 0    At any time in the past 12 months, were you homeless or living in a shelter (including now)?: No  Utilities: Not At Risk (03/11/2024)   Received from [facility]   Epic    In the past 12 months has the electric, gas, oil, or water company threatened to shut off services in your home?: No  Health Literacy: Adequate Health Literacy (09/07/2023)   B1300 Health Literacy    Frequency of need for help with medical instructions: Never     Constitutional: Denies fever, malaise, fatigue, headache or abrupt weight changes.  Respiratory: Denies difficulty breathing, shortness of breath, cough or sputum production.   Cardiovascular: Denies chest pain, chest tightness,  palpitations or swelling in the hands or feet.  Gastrointestinal: Denies abdominal pain, bloating, constipation, diarrhea or blood in the stool.  GU: Denies urgency, frequency, pain with urination, burning sensation, blood in urine, odor or discharge. Musculoskeletal: Pt reports right hip pain,, chronic left hip pain, acute on chronic back pain. Denies decrease in range of motion, difficulty with gait, muscle pain or joint swelling.   Skin: Pt reports contusion of buttocks. Denies redness, rashes, lesions or ulcercations.  Neurological: Denies dizziness, difficulty with memory, difficulty with speech or problems with balance and coordination.    No other specific complaints in a complete review of systems (except as listed in HPI above).      Objective:   Physical Exam  BP 92/60 (BP Location: Left Arm, Patient Position: Sitting, Cuff Size: Normal)   Ht 5' 10 (1.778 m)   Wt 178 lb 6.4 oz (80.9 kg)   BMI 25.60 kg/m   Wt Readings from Last 3 Encounters:  02/01/24 184 lb (83.5 kg)  08/06/23 186 lb 4 oz (84.5 kg)  07/23/23 178 lb (80.7 kg)    General: Appears his stated age, overweight, in NAD. Skin: Warm, dry and intact.  3 cm contusion noted of the right upper buttocks near the gluteal fold. Cardiovascular: Normal rate and rhythm. S1,S2 noted.  No murmur, rubs or gallops noted. Pulmonary/Chest: Normal effort and positive vesicular breath sounds. No respiratory distress. No wheezes, rales or ronchi noted.  Musculoskeletal: Normal flexion, extension, rotation and lateral bending of the spine but pain with rotation and lateral bending bilaterally.  Pain with palpation over the lumbar spine and right SI joint.  Normal abduction and adduction, internal and external rotation of the right hip but pain with internal rotation of the right hip.  Pinpoint tenderness noted over the right trochanter.  Normal range of motion of the left hip.  Strength 5/5 BLE.  He has some difficulty getting from a sitting to a standing position.  No difficulty with gait.  Neurological: Alert and oriented. Coordination normal.  Sensation intact to BLE.   BMET    Component Value Date/Time   NA 139 08/07/2023 0815   NA 139 05/29/2023 1535   K 4.9 08/07/2023 0815   CL 101 08/07/2023 0815   CO2 29 08/07/2023 0815   GLUCOSE 168 (H) 08/07/2023 0815   BUN 21 08/07/2023 0815   BUN 20 05/29/2023 1535   CREATININE 1.16 08/07/2023 0815   CALCIUM   9.4 08/07/2023 0815   GFRNONAA >60 03/03/2022 1552   GFRNONAA 74 06/24/2018 0846   GFRAA 67 05/31/2020 0903   GFRAA 85 06/24/2018 0846    Lipid Panel     Component Value Date/Time   CHOL 113 08/07/2023 0815   CHOL 139 12/14/2014 0834   TRIG 101 08/07/2023 0815   HDL 48 08/07/2023 0815   HDL 43 12/14/2014 0834   CHOLHDL 2.4 08/07/2023 0815   VLDL 20 11/21/2019 0915   LDLCALC 47 08/07/2023 0815    CBC    Component Value Date/Time   WBC 8.7 08/07/2023 0815   RBC 4.97 08/07/2023 0815   HGB 14.8 08/07/2023 0815   HGB 15.7 05/29/2023 1535   HCT 44.0 08/07/2023 0815   HCT 46.9 05/29/2023 1535   PLT 297 08/07/2023 0815   PLT 343 05/29/2023 1535   MCV 88.5 08/07/2023 0815   MCV 88 05/29/2023 1535   MCH 29.8 08/07/2023 0815   MCHC 33.6 08/07/2023 0815   RDW 14.1 08/07/2023 0815   RDW 14.3  05/29/2023 1535   LYMPHSABS 1,178 08/07/2022 0932   LYMPHSABS 1.0 05/31/2020 0903   MONOABS 1.6 (H) 06/27/2019 0508   EOSABS 418 08/07/2023 0815   EOSABS 0.0 05/31/2020 0903   BASOSABS 87 08/07/2023 0815   BASOSABS 0.0 05/31/2020 0903    Hgb A1C Lab Results  Component Value Date   HGBA1C 8.0 (H) 08/07/2023            Assessment & Plan:  Assessment and Plan    Acute low back pain after fall Acute low back pain post-fall, localized, no radiation, no bowel/bladder issues. Degenerative disc disease and arthritis noted. Tylenol  provides partial relief. - Ordered x-ray of the low back to rule out compression fracture. - Continue Tylenol  up to 1000 mg every 8 hours for pain management. - Apply ice for 10 minutes 2 times daily to the affected area.  Acute hip pain after fall Acute hip pain post-fall, small bruise due to Eliquis , no severe pain, no hip replacement history. - Ordered x-ray of the bilateral hip to rule out fracture. - Continue Tylenol  up to 1000 mg every 8 hours for pain management. - Apply ice for 10 minutes twice daily to the affected area.     Contusion of  buttock Likely secondary to apixaban  use.  No concern for hematoma or infected hematoma at this time - Reassurance provided that this will resolve with time   Follow-up with your PCP as previously scheduled Angeline Laura, NP     [1]  Allergies Allergen Reactions   Bydureon  [Exenatide ] Anaphylaxis    Possible from bydureon , not 100% confirmed   Clindamycin/Lincomycin Hives and Itching   Penicillins Hives, Itching, Rash and Other (See Comments)    Has patient had a PCN reaction causing immediate rash, facial/tongue/throat swelling, SOB or lightheadedness with hypotension: Yes Has patient had a PCN reaction causing severe rash involving mucus membranes or skin necrosis: No Has patient had a PCN reaction that required hospitalization: No Has patient had a PCN reaction occurring within the last 10 years: No If all of the above answers are NO, then may proceed with Cephalosporin use.   Sulfa Antibiotics Shortness Of Breath   Tiotropium Shortness Of Breath and Swelling   Penicillin G Hives    Joint stiffness   Spiriferis Other (See Comments)    Affected breathing   Spiriva  Handihaler [Tiotropium Bromide ]     Affected breathing   Iodinated Contrast Media Rash and Other (See Comments)    Redness Redness   Other Rash    Stress test dye

## 2024-04-14 NOTE — Patient Instructions (Signed)
 Contusion A contusion is a deep bruise. This is a result of an injury that causes bleeding under the skin. Symptoms of bruising include pain, swelling, and discolored skin. The skin may turn blue, purple, or yellow. Follow these instructions at home: Managing pain, stiffness, and swelling You may use RICE. This stands for: Resting. Icing. Compression, or putting pressure on the injured area. Elevating, or raising the injured area. To follow this method, do these actions: Rest the injured area. If told, put ice on the injured area. To do this: Put ice in a plastic bag. Place a towel between your skin and the bag. Leave the ice on for 20 minutes, 2-3 times per day. If your skin turns bright red, take off the ice right away to prevent skin damage. The risk of skin damage is higher if you cannot feel pain, heat, or cold. If told, apply compression on the injured area using an elastic bandage. Make sure the bandage is not too tight. If the area tingles or has a loss of feeling (numbness), remove it and put it back on as told by your doctor. If possible, elevate the injured area above the level of your heart while you are sitting or lying down.  General instructions Take over-the-counter and prescription medicines only as told by your doctor. Keep all follow-up visits. Your doctor may want to see how your contusion is healing with treatment. Contact a doctor if: Your symptoms do not get better after several days of treatment. Your symptoms get worse. You have trouble moving the injured area. Get help right away if: You have very bad pain. You have a loss of feeling (numbness) in a hand or foot. Your hand or foot turns pale or cold. This information is not intended to replace advice given to you by your health care provider. Make sure you discuss any questions you have with your health care provider. Document Revised: 10/03/2021 Document Reviewed: 10/03/2021 Elsevier Patient Education  2024  [employer].

## 2024-04-14 NOTE — Addendum Note (Signed)
 Addended by: ANTONETTE ANGELINE ORN on: 04/14/2024 01:24 PM   Modules accepted: Orders

## 2024-04-16 ENCOUNTER — Other Ambulatory Visit: Payer: Self-pay | Admitting: Family Medicine

## 2024-04-16 DIAGNOSIS — E1142 Type 2 diabetes mellitus with diabetic polyneuropathy: Secondary | ICD-10-CM

## 2024-04-19 NOTE — Telephone Encounter (Signed)
 Requested Prescriptions  Pending Prescriptions Disp Refills   metFORMIN  (GLUCOPHAGE ) 1000 MG tablet [Pharmacy Med Name: metFORMIN  HCl 1000 MG Oral Tablet] 180 tablet 0    Sig: Take 1 tablet (1,000 mg total) by mouth 2 (two) times daily with a meal. OFFICE VISIT NEEDED FOR ADDITIONAL REFILLS     Endocrinology:  Diabetes - Biguanides Failed - 04/19/2024  8:07 AM      Failed - HBA1C is between 0 and 7.9 and within 180 days    Hemoglobin A1C  Date Value Ref Range Status  06/01/2020 8.7  Final    Comment:    [employer] Nurse   Hgb A1c MFr Bld  Date Value Ref Range Status  08/07/2023 8.0 (H) <5.7 % of total Hgb Final    Comment:    For someone without known diabetes, a hemoglobin A1c value of 6.5% or greater indicates that they may have  diabetes and this should be confirmed with a follow-up  test. . For someone with known diabetes, a value <7% indicates  that their diabetes is well controlled and a value  greater than or equal to 7% indicates suboptimal  control. A1c targets should be individualized based on  duration of diabetes, age, comorbid conditions, and  other considerations. . Currently, no consensus exists regarding use of hemoglobin A1c for diagnosis of diabetes for children. .          Failed - B12 Level in normal range and within 720 days    Vitamin B-12  Date Value Ref Range Status  07/27/2021 253 200 - 1,100 pg/mL Final    Comment:    . Please Note: Although the reference range for vitamin B12 is [phone] pg/mL, it has been reported that between 5 and 10% of patients with values between 200 and 400 pg/mL may experience neuropsychiatric and hematologic abnormalities due to occult B12 deficiency; less than 1% of patients with values above 400 pg/mL will have symptoms. .          Failed - Valid encounter within last 6 months    Recent Outpatient Visits           5 days ago Fall due to stumbling, initial encounter   [facility] [place],  Angeline ORN, NP   9 months ago Acute non-recurrent frontal sinusitis   [facility] Edman Marsa PARAS, DO   10 months ago Left wrist pain   [place] [place], Angeline ORN, NP       Future Appointments             In 3 months Furth, [name], [name] [facility] - Cr in normal range and within 360 days    Creat  Date Value Ref Range Status  08/07/2023 1.16 0.70 - 1.28 mg/dL Final   Creatinine, Urine  Date Value Ref Range Status  08/07/2023 73 20 - 320 mg/dL Final         Passed - eGFR in normal range and within 360 days    GFR, Est African American  Date Value Ref Range Status  06/24/2018 85 > OR = 60 mL/min/1.18m2 Final   GFR calc Af Amer  Date Value Ref Range Status  05/31/2020 67 >59 mL/min/1.73 Final    Comment:    **In accordance with recommendations from the NKF-ASN Task force,**   [facility] is in the  process of updating its eGFR calculation to the   2021 CKD-EPI creatinine equation that estimates kidney function   without a race variable.    GFR, Est Non African American  Date Value Ref Range Status  06/24/2018 74 > OR = 60 mL/min/1.53m2 Final   GFR, Estimated  Date Value Ref Range Status  03/03/2022 >60 >60 mL/min Final    Comment:    (NOTE) Calculated using the CKD-EPI Creatinine Equation (2021)    eGFR  Date Value Ref Range Status  08/07/2023 65 > OR = 60 mL/min/1.74m2 Final  05/29/2023 62 >59 mL/min/1.73 Final         Passed - CBC within normal limits and completed in the last 12 months    WBC  Date Value Ref Range Status  08/07/2023 8.7 3.8 - 10.8 Thousand/uL Final   RBC  Date Value Ref Range Status  08/07/2023 4.97 4.20 - 5.80 Million/uL Final   Hemoglobin  Date Value Ref Range Status  08/07/2023 14.8 13.2 - 17.1 g/dL Final  98/71/7974 84.2 13.0 - 17.7 g/dL Final   HCT  Date Value Ref Range Status  08/07/2023 44.0 38.5 - 50.0 % Final    Hematocrit  Date Value Ref Range Status  05/29/2023 46.9 37.5 - 51.0 % Final   MCHC  Date Value Ref Range Status  08/07/2023 33.6 32.0 - 36.0 g/dL Final    Comment:    For adults, a slight decrease in the calculated MCHC value (in the range of 30 to 32 g/dL) is most likely not clinically significant; however, it should be interpreted with caution in correlation with other red cell parameters and the patient's clinical condition.    [facility]  Date Value Ref Range Status  08/07/2023 29.8 27.0 - 33.0 pg Final   MCV  Date Value Ref Range Status  08/07/2023 88.5 80.0 - 100.0 fL Final  05/29/2023 88 79 - 97 fL Final   No results found for: PLTCOUNTKUC, LABPLAT, POCPLA RDW  Date Value Ref Range Status  08/07/2023 14.1 11.0 - 15.0 % Final  05/29/2023 14.3 11.6 - 15.4 % Final

## 2024-04-30 NOTE — Telephone Encounter (Signed)
 Reached out to patient again and he will send in application for Breztri  & farxiga  next week.

## 2024-05-05 NOTE — Telephone Encounter (Signed)
 PAP: Application for Breztri  and Farxiga  has been submitted to [employer] ([employer]), via fax With supporting documents.

## 2024-05-05 NOTE — Telephone Encounter (Signed)
 PAP: Patient assistance application for Breztri  and Farxiga  has been approved by PAP Companies: [employer] from 05/05/2024 to 04/30/2025. Medication should be delivered to PAP Delivery: Home. For further shipping updates, please contact [employer] ([employer]) at [phone]. Patient ID is: [id number]

## 2024-05-07 ENCOUNTER — Other Ambulatory Visit: Payer: Self-pay | Admitting: Family Medicine

## 2024-05-07 DIAGNOSIS — E1142 Type 2 diabetes mellitus with diabetic polyneuropathy: Secondary | ICD-10-CM

## 2024-05-08 NOTE — Telephone Encounter (Signed)
 Requested Prescriptions  Pending Prescriptions Disp Refills   gabapentin  (NEURONTIN ) 100 MG capsule [Pharmacy Med Name: Gabapentin  100 MG Oral Capsule] 270 capsule 0    Sig: TAKE 1 CAPSULE BY MOUTH THREE TIMES DAILY     Neurology: Anticonvulsants - gabapentin  Passed - 05/08/2024 12:57 PM      Passed - Cr in normal range and within 360 days    Creat  Date Value Ref Range Status  08/07/2023 1.16 0.70 - 1.28 mg/dL Final   Creatinine, Urine  Date Value Ref Range Status  08/07/2023 73 20 - 320 mg/dL Final         Passed - Completed PHQ-2 or PHQ-9 in the last 360 days      Passed - Valid encounter within last 12 months    Recent Outpatient Visits           3 weeks ago Fall due to stumbling, initial encounter   [facility] [place], Angeline ORN, NP   9 months ago Acute non-recurrent frontal sinusitis   [facility] Edman Marsa PARAS, DO   10 months ago Left wrist pain   [facility] [place], Angeline ORN, NP       Future Appointments             In 2 months Furth, [name], [name] [facility] [place]

## 2024-05-12 ENCOUNTER — Telehealth: Payer: Self-pay

## 2024-05-12 NOTE — Telephone Encounter (Signed)
 Copied from CRM #[id number]. Topic: Clinical - Prescription Issue >> May 12, 2024 11:35 AM Treva T wrote: Reason for CRM: Received call from Diilan with [facility] pharmacy.  Reports received 2 prescriptions for pt, however the DOB on prescription for medication, Farxiga , does not match pt DOB as listed.  DOB listed on that prescription was 08/11/1955.  Requesting a return call to verify information and submitted prescriptions on behalf of pt.  Can be reached fro follow up call at [phone].  Order Ref# [id number]  Caller aware of same day follow up call.

## 2024-05-12 NOTE — Telephone Encounter (Signed)
 Last refill that was sent for Farxiga  was in 2024.

## 2024-05-26 ENCOUNTER — Other Ambulatory Visit

## 2024-05-27 ENCOUNTER — Other Ambulatory Visit: Admitting: Pharmacist

## 2024-05-27 DIAGNOSIS — I129 Hypertensive chronic kidney disease with stage 1 through stage 4 chronic kidney disease, or unspecified chronic kidney disease: Secondary | ICD-10-CM

## 2024-05-27 DIAGNOSIS — E1142 Type 2 diabetes mellitus with diabetic polyneuropathy: Secondary | ICD-10-CM

## 2024-05-27 DIAGNOSIS — J432 Centrilobular emphysema: Secondary | ICD-10-CM

## 2024-05-27 DIAGNOSIS — E119 Type 2 diabetes mellitus without complications: Secondary | ICD-10-CM

## 2024-05-27 NOTE — Progress Notes (Signed)
 "  05/27/2024 Name: Philip VELAZCO Sr. MRN: [id number] DOB: June 18, 1945  Chief Complaint  Patient presents with   Medication Management   Medication Assistance    Philip MCKEEHAN Sr. is a 79 y.o. year old male who presented for a telephone visit.   They were referred to the pharmacist by their PCP for assistance in managing diabetes, hypertension, hyperlipidemia, and medication access.    Subjective:   Care Team: Primary Care Provider: Edman Marsa PARAS, DO Cardiologist: Darron Deatrice LABOR, MD; Next Scheduled Visit: 08/01/2024 Pulmonologist: Theotis Lavelle BRAVO, MD; Next Scheduled Visit: 07/24/2024 Orthopedics: Mardee Lynwood Lucy, MD; Endocrinologist: Mariel Glenys Gunner, NP; Next Scheduled Visit: 06/02/2024 Otolaryngologist: Danita Pauletta Shown, MD    Medication Access/Adherence  Current Pharmacy:  [facility] [address]), [place] - [address] [place] ([place]) [place] [address] Phone: [phone] Fax: [phone]   Patient reports affordability concerns with their medications: No  Patient reports access/transportation concerns to their pharmacy: No  Patient reports adherence concerns with their medications:  No     Patient uses weekly pillbox      Type 2 Diabetes:   Current medications: metformin  1,000 mg twice daily  Farxiga  10 mg - 1 tablet QAM     Current glucose readings:    Fasting Blood Glucose After Supper/Bedtime  21 - January 104 155  22 - January 121 122  23 - January 116 132  24 - January 116 190  25 - January 114 169  26 - January 109 120  27 - January 107      Reports maintaining significant changes to reduce portion sizes sweets in his diet    Reports using blood sugar readings as feedback on dietary choices   Denies symptoms of hypoglycemia   Current medication access support: enrolled in patient assistance for Farxiga  from [employer] through 04/30/2025 - Today reports has ~3 month supply  remaining     CHF/Hypertension/ Atrial Fibrillation:   Patient followed by [facility]   Current medications:  Metoprolol  50 mg twice daily  Spironolactone  25 mg - 1/2 tablet (12.5 mg) daily  Farxiga  10 mg - 1 tablets daily Furosemide  20 mg daily   Anticoagulation: Eliquis  5 mg twice daily   Patient has an automated, upper arm home BP cuff Current blood pressure readings readings:  - Last checked this morning: 121/66, HR 61   Denies symptoms of hypotension such as dizziness or lightheadedness    Confirms monitoring home weight daily as directed by Cardiology; reports home weight stable ~170 lbs    Current medication access support: enrolled in patient assistance for Farxiga  from [employer] through 04/30/2025     COPD/seasonal allergies: Patient follows with Dr. Theotis at [facility]   Current medications: Breztri  2 puffs twice daily Montelukast  10 mg QHS Albuterol  inhaler as needed Cetirizine  10 mg daily Flonase  nasal spray as needed     Confirms using Breztri  inhaler consistently as directed and rinsing mouth out after each use   Avoids being outdoors in higher humidity to aid with breathing   Current medication access support: Enrolled in Breztri  patient assistance program through [employer] through 04/30/2025      Objective:  Lab Results  Component Value Date   HGBA1C 8.0 (H) 08/07/2023    Lab Results  Component Value Date   CREATININE 1.16 08/07/2023   BUN 21 08/07/2023   NA 139 08/07/2023   K 4.9 08/07/2023   CL 101 08/07/2023  CO2 29 08/07/2023    Lab Results  Component Value Date   CHOL 113 08/07/2023   HDL 48 08/07/2023   LDLCALC 47 08/07/2023   TRIG 101 08/07/2023   CHOLHDL 2.4 08/07/2023   BP Readings from Last 3 Encounters:  04/14/24 92/60  02/01/24 (!) 100/50  08/06/23 100/70   Pulse Readings from Last 3 Encounters:  02/01/24 93  08/06/23 89  07/23/23 85     Medications Reviewed Today      Reviewed by Alana Sharyle LABOR, RPH-CPP (Pharmacist) on 05/27/24 at [phone]  Med List Status: <None>   Medication Order Taking? Sig Documenting Provider Last Dose Status Informant  ACCU-CHEK GUIDE TEST test strip [id number]  Use to check blood sugar up to 2 x daily Edman Marsa PARAS, DO  Active   Accu-Chek Softclix Lancets lancets [id number]  Use to check blood sugar up to 2 x daily Edman Marsa PARAS, DO  Active   acetaminophen  (TYLENOL ) 500 MG tablet [id number]  Take 1,000 mg by mouth every 8 (eight) hours as needed for moderate pain or mild pain. [provider]  Active Self  albuterol  (VENTOLIN  HFA) 108 (90 Base) MCG/ACT inhaler [id number]  Inhale 2 puffs into the lungs every 4 (four) hours as needed for wheezing or shortness of breath. [name], Marsa PARAS, DO  Active   apixaban  (ELIQUIS ) 5 MG TABS tablet [id number] Yes Take 1 tablet by mouth twice daily Darron Deatrice LABOR, MD  Active   ARTIFICIAL TEAR SOLUTION OP [id number]  Place 1 drop into both eyes daily. [provider]  Active Self  atorvastatin  (LIPITOR) 40 MG tablet [id number] Yes Take 1 tablet (40 mg total) by mouth daily. Darron Deatrice LABOR, MD  Active   Blood Glucose Monitoring Suppl (ACCU-CHEK GUIDE ME) w/Device KIT [id number]  Use to check blood sugar up to 2 x daily Edman Marsa PARAS, DO  Active   Blood Glucose Monitoring Suppl (ONE TOUCH ULTRA 2) w/Device KIT [id number]  Use to check blood sugar as advised up to twice a day Edman Marsa PARAS, DO  Active Self  BREZTRI  AEROSPHERE 160-9-4.8 MCG/ACT TERESE [id number] Yes Inhale 2 puffs into the lungs 2 (two) times daily. [name], Marsa PARAS, DO  Active   calcium  carbonate (TUMS - DOSED IN MG ELEMENTAL CALCIUM ) 500 MG chewable tablet [id number]  Chew 1,000 mg by mouth daily as needed for indigestion or heartburn. [provider]  Active Self  cetirizine  (ZYRTEC ) 10 MG tablet [id number]  Take 10 mg by mouth daily. [provider]   Active Self  cyanocobalamin (VITAMIN B12) 1000 MCG tablet [id number]  Take 1,000 mcg by mouth daily. [provider]  Active   FARXIGA  10 MG TABS tablet [id number] Yes Take 1 tablet (10 mg total) by mouth daily before breakfast. Edman Marsa PARAS, DO  Active   fluticasone  (FLONASE ) 50 MCG/ACT nasal spray [id number]  Use 2 spray(s) in each nostril once daily Edman Marsa PARAS, DO  Active   furosemide  (LASIX ) 20 MG tablet [id number] Yes Take 1 tablet (20 mg total) by mouth daily. [place], [name], [name]  Active   gabapentin  (NEURONTIN ) 100 MG capsule [id number]  TAKE 1 CAPSULE BY MOUTH THREE TIMES DAILY [name], Marsa PARAS, DO  Active   ipratropium-albuterol  (DUONEB) 0.5-2.5 (3) MG/3ML SOLN [id number]  Take 3 mLs by nebulization every 6 (six) hours as needed (shortness of breath). [provider]  Active Self           Med Note JERALYN, KYLE A  Fri Dec 24, 2020  4:04 PM)    levothyroxine  (SYNTHROID ) 88 MCG tablet [id number]  TAKE 1 TABLET BY MOUTH DAILY BEFORE BREAKFAST [name], Alexander JINNY, DO  Active   metFORMIN  (GLUCOPHAGE ) 1000 MG tablet [id number] Yes Take 1 tablet (1,000 mg total) by mouth 2 (two) times daily with a meal. OFFICE VISIT NEEDED FOR ADDITIONAL REFILLS Edman Marsa JINNY, DO  Active   metoprolol  tartrate (LOPRESSOR ) 50 MG tablet [id number] Yes Take 1 tablet (50 mg total) by mouth 2 (two) times daily. Furth, [name], [name]  Active   montelukast  (SINGULAIR ) 10 MG tablet [id number]  Take 1 tablet by mouth at bedtime. [provider]  Active   oxyCODONE  (OXY IR/ROXICODONE ) 5 MG immediate release tablet [id number]  Take 5 mg by mouth. [provider]  Active   spironolactone  (ALDACTONE ) 25 MG tablet [id number] Yes Take 1/2 (one-half) tablet by mouth once daily Darron Deatrice LABOR, MD  Active               Assessment/Plan:   Again encourage patient to call to schedule next follow up appointment with PCP   From review of chart,  note a representative from [facility] contacted PCP office on 05/12/2024 for clarification regarding patient's Farxiga  prescription - Will ask CPhT Suzen Mall to follow up with [employer] to determine if anything further is needed  Diabetes: - Counseled on importance of continuing to control carbohydrate portion sizes and using blood sugar checks to reinforce dietary choices Encourage patient to continue to limit sweets and sugary beverages (sweet tea, Pepsi) - Recommend to continue to check glucose, keep log of results, to bring this record to medical appointments and use readings as feedback on dietary choices - Patient to follow up with [employer] assistance program as needed regarding refills of Farxiga     CHF/Hypertension/Atrial Fibrillation: - Encourage patient to continue to weigh daily - Recommended to continue to check home blood pressure and heart rate, keep log of results and contact Cardiologist for BP and HR if needed for readings outside of established parameters or symptoms     COPD/Seasonal Allergies: - Reviewed appropriate inhaler technique - Patient to follow up with [employer] assistance program as needed regarding refills of Breztri  inhaler    Health Maintenance - Review CDC/ACIP recommendations for influenza, Shingles and Tetanus vaccines. Patient to follow up with local pharmacy for vaccinations. Reports received 1st dose of Shingrix vaccine in the past, but will schedule second part with his pharmacy     Follow Up Plan: Clinical Pharmacist will follow up with patient by telephone on 07/28/2024 at 9:00 AM       Sharyle Sia, PharmD, JAQUELINE, CPP Clinical Pharmacist [facility] [phone]   "

## 2024-05-27 NOTE — Patient Instructions (Signed)
Goals Addressed             This Visit's Progress    Pharmacy Goals       Our goal A1c is less than 7%. This corresponds with fasting sugars less than 130 and 2 hour after meal sugars less than 180. Please check your blood sugar and keep log of results  Please continue to monitor your home weight, blood pressure and heart rate  Our goal bad cholesterol, or LDL, is less than 70 . This is why it is important to continue taking your atorvastatin  Feel free to call me with any questions or concerns. I look forward to our next call!    [name], PharmD, [facility] Clinical Pharmacist [facility] [phone]

## 2024-05-29 ENCOUNTER — Other Ambulatory Visit: Payer: Self-pay | Admitting: Family Medicine

## 2024-05-29 DIAGNOSIS — E1142 Type 2 diabetes mellitus with diabetic polyneuropathy: Secondary | ICD-10-CM

## 2024-05-30 NOTE — Telephone Encounter (Signed)
 Refilled 05/08/24 # 270. Requested Prescriptions  Refused Prescriptions Disp Refills   gabapentin  (NEURONTIN ) 100 MG capsule [Pharmacy Med Name: Gabapentin  100 MG Oral Capsule] 270 capsule 0    Sig: TAKE 1 CAPSULE BY MOUTH THREE TIMES DAILY     Neurology: Anticonvulsants - gabapentin  Passed - 05/30/2024 10:29 AM      Passed - Cr in normal range and within 360 days    Creat  Date Value Ref Range Status  08/07/2023 1.16 0.70 - 1.28 mg/dL Final   Creatinine, Urine  Date Value Ref Range Status  08/07/2023 73 20 - 320 mg/dL Final         Passed - Completed PHQ-2 or PHQ-9 in the last 360 days      Passed - Valid encounter within last 12 months    Recent Outpatient Visits           1 month ago Fall due to stumbling, initial encounter   [facility] [place], Angeline ORN, NP   10 months ago Acute non-recurrent frontal sinusitis   [facility] Edman Marsa PARAS, DO   11 months ago Left wrist pain   [facility] [place], Angeline ORN, NP       Future Appointments             In 2 months Furth, [name], [name] [facility] [facility] at [place]

## 2024-07-28 ENCOUNTER — Other Ambulatory Visit

## 2024-08-01 ENCOUNTER — Ambulatory Visit: Admitting: Medical

## 2024-09-12 ENCOUNTER — Ambulatory Visit

## 2024-09-17 ENCOUNTER — Ambulatory Visit
